# Patient Record
Sex: Female | Born: 1935
Health system: Southern US, Community
[De-identification: ages and names within clinical notes are randomized; demographics above are authoritative.]

## PROBLEM LIST (undated history)

## (undated) DIAGNOSIS — Z87440 Personal history of urinary (tract) infections: Secondary | ICD-10-CM

## (undated) DIAGNOSIS — I10 Essential (primary) hypertension: Secondary | ICD-10-CM

## (undated) DIAGNOSIS — IMO0002 Reserved for concepts with insufficient information to code with codable children: Secondary | ICD-10-CM

## (undated) DIAGNOSIS — H8110 Benign paroxysmal vertigo, unspecified ear: Secondary | ICD-10-CM

## (undated) DIAGNOSIS — M8580 Other specified disorders of bone density and structure, unspecified site: Secondary | ICD-10-CM

## (undated) DIAGNOSIS — E039 Hypothyroidism, unspecified: Secondary | ICD-10-CM

## (undated) DIAGNOSIS — D649 Anemia, unspecified: Secondary | ICD-10-CM

## (undated) DIAGNOSIS — K219 Gastro-esophageal reflux disease without esophagitis: Secondary | ICD-10-CM

## (undated) DIAGNOSIS — Z8719 Personal history of other diseases of the digestive system: Secondary | ICD-10-CM

## (undated) DIAGNOSIS — E785 Hyperlipidemia, unspecified: Secondary | ICD-10-CM

## (undated) DIAGNOSIS — L309 Dermatitis, unspecified: Secondary | ICD-10-CM

## (undated) DIAGNOSIS — F419 Anxiety disorder, unspecified: Secondary | ICD-10-CM

## (undated) DIAGNOSIS — I4589 Other specified conduction disorders: Secondary | ICD-10-CM

## (undated) DIAGNOSIS — A419 Sepsis, unspecified organism: Secondary | ICD-10-CM

## (undated) HISTORY — PX: BREAST SURGERY: SHX581

## (undated) HISTORY — PX: CATARACT EXTRACTION: SUR2

## (undated) HISTORY — DX: Other specified conduction disorders: I45.89

## (undated) HISTORY — DX: Personal history of urinary (tract) infections: Z87.440

## (undated) HISTORY — PX: EYE SURGERY: SHX253

## (undated) HISTORY — PX: TONSILLECTOMY: SUR1361

## (undated) HISTORY — PX: DILATION AND CURETTAGE OF UTERUS: SHX78

## (undated) HISTORY — PX: OTHER SURGICAL HISTORY: SHX169

---

## 1998-01-08 ENCOUNTER — Other Ambulatory Visit: Admission: RE | Admit: 1998-01-08 | Discharge: 1998-01-08 | Payer: Self-pay | Admitting: Radiology

## 1998-03-05 ENCOUNTER — Other Ambulatory Visit: Admission: RE | Admit: 1998-03-05 | Discharge: 1998-03-05 | Payer: Self-pay | Admitting: Endocrinology

## 1998-04-08 ENCOUNTER — Ambulatory Visit (HOSPITAL_COMMUNITY): Admission: RE | Admit: 1998-04-08 | Discharge: 1998-04-08 | Payer: Self-pay | Admitting: Internal Medicine

## 1999-06-05 ENCOUNTER — Other Ambulatory Visit: Admission: RE | Admit: 1999-06-05 | Discharge: 1999-06-05 | Payer: Self-pay | Admitting: Internal Medicine

## 2000-06-06 ENCOUNTER — Other Ambulatory Visit: Admission: RE | Admit: 2000-06-06 | Discharge: 2000-06-06 | Payer: Self-pay | Admitting: Internal Medicine

## 2000-07-26 DIAGNOSIS — A419 Sepsis, unspecified organism: Secondary | ICD-10-CM

## 2000-07-26 HISTORY — DX: Sepsis, unspecified organism: A41.9

## 2009-07-26 HISTORY — PX: KNEE ARTHROSCOPY: SUR90

## 2010-05-20 ENCOUNTER — Ambulatory Visit (HOSPITAL_BASED_OUTPATIENT_CLINIC_OR_DEPARTMENT_OTHER): Admission: RE | Admit: 2010-05-20 | Discharge: 2010-05-20 | Payer: Self-pay | Admitting: Orthopedic Surgery

## 2010-05-26 ENCOUNTER — Emergency Department (HOSPITAL_BASED_OUTPATIENT_CLINIC_OR_DEPARTMENT_OTHER): Admission: EM | Admit: 2010-05-26 | Discharge: 2010-05-26 | Payer: Self-pay | Admitting: Emergency Medicine

## 2010-05-26 ENCOUNTER — Ambulatory Visit: Payer: Self-pay | Admitting: Diagnostic Radiology

## 2010-10-06 LAB — COMPREHENSIVE METABOLIC PANEL
AST: 44 U/L — ABNORMAL HIGH (ref 0–37)
Albumin: 4 g/dL (ref 3.5–5.2)
Alkaline Phosphatase: 83 U/L (ref 39–117)
BUN: 10 mg/dL (ref 6–23)
Chloride: 96 mEq/L (ref 96–112)
GFR calc Af Amer: >60 mL/min (ref >60)
Potassium: 3.6 mEq/L (ref 3.5–5.1)
Total Bilirubin: 0.5 mg/dL (ref 0.3–1.2)

## 2010-10-06 LAB — CBC
Hemoglobin: 11 g/dL — ABNORMAL LOW (ref 12.0–15.0)
MCV: 102.2 fL — ABNORMAL HIGH (ref 78.0–100.0)
Platelets: 217 10*3/uL (ref 150–400)
RBC: 3.02 MIL/uL — ABNORMAL LOW (ref 3.87–5.11)
WBC: 4.4 10*3/uL (ref 4.0–10.5)

## 2010-10-06 LAB — CULTURE, BLOOD (ROUTINE X 2)
Culture  Setup Time: 201111012322
Culture  Setup Time: 201111012322

## 2010-10-06 LAB — DIFFERENTIAL
Basophils Absolute: 0 10*3/uL (ref 0.0–0.1)
Basophils Relative: 0 % (ref 0–1)
Eosinophils Relative: 1 % (ref 0–5)
Monocytes Absolute: 0.5 10*3/uL (ref 0.1–1.0)

## 2010-10-06 LAB — URINALYSIS, ROUTINE W REFLEX MICROSCOPIC
Bilirubin Urine: NEGATIVE
Ketones, ur: NEGATIVE mg/dL
Protein, ur: NEGATIVE mg/dL
Urobilinogen, UA: 0.2 mg/dL (ref 0.0–1.0)

## 2010-10-06 LAB — URINE CULTURE: Culture  Setup Time: 201111012205

## 2010-10-07 LAB — POCT I-STAT, CHEM 8
Creatinine, Ser: 0.6 mg/dL (ref 0.4–1.2)
Glucose, Bld: 107 mg/dL — ABNORMAL HIGH (ref 70–99)
Hemoglobin: 13.3 g/dL (ref 12.0–15.0)
TCO2: 30 mmol/L (ref 0–100)

## 2012-03-20 ENCOUNTER — Other Ambulatory Visit: Payer: Self-pay | Admitting: Orthopedic Surgery

## 2012-03-20 MED ORDER — DEXAMETHASONE SODIUM PHOSPHATE 10 MG/ML IJ SOLN: 10.0000 mg | Freq: Once | INTRAMUSCULAR | Status: DC

## 2012-03-20 MED ORDER — BUPIVACAINE 0.25 % ON-Q PUMP SINGLE CATH 300ML: 300.0000 mL | INJECTION | Status: DC

## 2012-03-20 NOTE — Progress Notes (Signed)
Preoperative surgical orders have been place into the [facility] hospital system for Zeenat Jeanbaptiste Finau on 03/20/2012, 8:24 AM  by Patrica Duel for surgery on 04/24/12.  Preop Total Knee orders including Bupivacaine On-Q pump, IV Tylenol, and IV Decadron as long as there are no contraindications to the above medications. Avel Peace, PA-C

## 2012-04-05 NOTE — H&P (Signed)
[name] [name] DOB: [birth year]  Chief Complaint: right knee pain  History of Present Illness The patient is a 78 year old female who comes in today for a preoperative History and Physical. The patient is scheduled for a right total knee arthroplasty to be performed by Dr. Gus Rankin. Aluisio, MD at [facility] on Monday April 24, 2012 . The patient is being followed for their right knee pain and osteoarthritis. Symptoms reported today include pain, swelling and instability. The patient feels that they are doing poorly. Unfortunately, her pain is getting worse. She is having more limitations in function. She hurts all the time including at night. She cannot do things that she desires. She has now had arthroscopy, cortisone and viscosupplements without benefit. She has significant arthritis in that knee. She is near bone on bone medial and lateral with tibial subluxation. At this point the most predictable means of getting her better would be a knee replacement.    Problem List/Past MedicalHistory Osteoarthritis, Knee (715.96) Hypertension Anxiety/Depression Vertigo Cataract Extraction-Left Hiatal Hernia Gastroesophageal Reflux Disease Urinary Tract Infection Anemia Hypothyroidism Measles Eczema Osteopenia   Allergies No Known Drug Allergies.    Family History Father. deceased age 61 due to lung cancer Mother. deceased age 28 due to stroke, heart disease Siblings. deceased age 60 and 22 due to CAD   Social History Alcohol use. Drinks wine. occasionally Tobacco use. Former smoker. Living situation. Lives alone. Post-Surgical Plans. SNF- [facility] Advance Directives. living will, healthcare POA   Medication History Levothyroxine Sodium ( Tablet, Oral daily) Active. Benicar HCT (20-12.5MG  Tablet, Oral daily) Active. Aspirin Childrens (81MG  Tablet Chewable, Oral evry other day) Active. CloNIDine HCl (0.1MG  Tablet, Oral daily)  Active. Omeprazole (20MG  Tablet DR, Oral daily) Active. Evista (60MG  Tablet, Oral at bedtime) Active. ALPRAZolam (0.25MG  Tablet, Oral as needed) Active.   Past Surgical History Arthroscopic Knee Surgery - Right. 2011    Review of Systems General:Not Present- Chills, Fever, Night Sweats, Fatigue, Weight Gain, Weight Loss and Memory Loss. Skin:Not Present- Hives, Itching, Rash, Eczema and Lesions. HEENT:Not Present- Tinnitus, Headache, Double Vision, Visual Loss, Hearing Loss and Dentures. Respiratory:Not Present- Shortness of breath with exertion, Shortness of breath at rest, Allergies, Coughing up blood and Chronic Cough. Cardiovascular:Not Present- Chest Pain, Racing/skipping heartbeats, Difficulty Breathing Lying Down, Murmur, Swelling and Palpitations. Gastrointestinal:Not Present- Bloody Stool, Heartburn, Abdominal Pain, Vomiting, Nausea, Constipation, Diarrhea, Difficulty Swallowing, Jaundice and Loss of appetitie. Female Genitourinary:Present- Urinating at Night. Not Present- Blood in Urine, Urinary frequency, Weak urinary stream, Discharge, Flank Pain, Incontinence, Painful Urination, Urgency and Urinary Retention. Musculoskeletal:Present- Muscle Weakness, Joint Swelling, Joint Pain and Back Pain. Not Present- Muscle Pain, Morning Stiffness and Spasms. Neurological:Not Present- Tremor, Dizziness, Blackout spells, Paralysis, Difficulty with balance and Weakness. Psychiatric:Present- Insomnia.   Vitals Weight: 158 lb Height: 65.5 in Body Surface Area: 1.82 m Body Mass Index: 25.89 kg/m Pulse: 64 (Regular) Resp.: 16 (Unlabored) BP: 128/82 (Sitting, Left Arm, Standard)    Physical Exam General Mental Status - Alert, cooperative and good historian. General Appearance- pleasant. Not in acute distress. Orientation- Oriented X3. Build & Nutrition- Well nourished and Well developed. Head and Neck Head- normocephalic, atraumatic . Neck Global  Assessment- supple. no bruit auscultated on the right and no bruit auscultated on the left. Eye Pupil- Bilateral- Regular and Round. Motion- Bilateral- EOMI. Chest and Lung Exam Auscultation: Breath sounds:- clear at anterior chest wall and - clear at posterior chest wall. Adventitious sounds:- No Adventitious sounds. Cardiovascular Auscultation:Rhythm- Regular rate and rhythm.  Heart Sounds- S1 WNL and S2 WNL. Murmurs & Other Heart Sounds:Auscultation of the heart reveals - No Murmurs. Abdomen Palpation/Percussion:Tenderness- Abdomen is non-tender to palpation. Rigidity (guarding)- Abdomen is soft. Auscultation:Auscultation of the abdomen reveals - Bowel sounds normal. Female Genitourinary Not done, not pertinent to present illness Peripheral Vascular Upper Extremity: Palpation:- Pulses bilaterally normal. Lower Extremity: Palpation:- Pulses bilaterally normal. Neurologic Examination of related systems reveals - normal muscle strength and tone in all extremities. Neurologic evaluation reveals - normal sensation. Musculoskeletal Right knee shows trace effusion. Range is 5-120. There is marked crepitus on range of motion. She is tender lateral greater than medial. There is no instability.   Assessment & Plan Osteoarthritis, Knee (715.96) Right total knee arthroplasty     Dimitri Ped, PA-C

## 2012-04-12 ENCOUNTER — Encounter (HOSPITAL_COMMUNITY): Payer: Self-pay | Admitting: Pharmacy Technician

## 2012-04-18 ENCOUNTER — Encounter (HOSPITAL_COMMUNITY)
Admission: RE | Admit: 2012-04-18 | Discharge: 2012-04-18 | Disposition: A | Discharge status: Home / Self Care | Payer: Medicare Other | Source: Ambulatory Visit | Attending: Orthopedic Surgery | Admitting: Orthopedic Surgery

## 2012-04-18 ENCOUNTER — Encounter (HOSPITAL_COMMUNITY): Payer: Self-pay

## 2012-04-18 HISTORY — DX: Anemia, unspecified: D64.9

## 2012-04-18 HISTORY — DX: Dermatitis, unspecified: L30.9

## 2012-04-18 HISTORY — DX: Gastro-esophageal reflux disease without esophagitis: K21.9

## 2012-04-18 HISTORY — DX: Other specified disorders of bone density and structure, unspecified site: M85.80

## 2012-04-18 HISTORY — DX: Reserved for concepts with insufficient information to code with codable children: IMO0002

## 2012-04-18 HISTORY — DX: Hyperlipidemia, unspecified: E78.5

## 2012-04-18 HISTORY — DX: Hypothyroidism, unspecified: E03.9

## 2012-04-18 HISTORY — DX: Personal history of other diseases of the digestive system: Z87.19

## 2012-04-18 HISTORY — DX: Benign paroxysmal vertigo, unspecified ear: H81.10

## 2012-04-18 HISTORY — DX: Sepsis, unspecified organism: A41.9

## 2012-04-18 HISTORY — DX: Anxiety disorder, unspecified: F41.9

## 2012-04-18 HISTORY — DX: Essential (primary) hypertension: I10

## 2012-04-18 LAB — URINALYSIS, ROUTINE W REFLEX MICROSCOPIC
Bilirubin Urine: NEGATIVE
Glucose, UA: NEGATIVE mg/dL
Hgb urine dipstick: NEGATIVE
Ketones, ur: NEGATIVE mg/dL
Protein, ur: NEGATIVE mg/dL
pH: 6 (ref 5.0–8.0)

## 2012-04-18 LAB — COMPREHENSIVE METABOLIC PANEL
ALT: 16 U/L (ref 0–35)
AST: 36 U/L (ref 0–37)
CO2: 25 mEq/L (ref 19–32)
Chloride: 83 mEq/L — ABNORMAL LOW (ref 96–112)
GFR calc Af Amer: >90 mL/min (ref >90)
GFR calc non Af Amer: 90 mL/min — ABNORMAL LOW (ref >90)
Glucose, Bld: 117 mg/dL — ABNORMAL HIGH (ref 70–99)
Sodium: 120 mEq/L — ABNORMAL LOW (ref 135–145)
Total Bilirubin: 0.4 mg/dL (ref 0.3–1.2)

## 2012-04-18 LAB — CBC
Hemoglobin: 11.6 g/dL — ABNORMAL LOW (ref 12.0–15.0)
MCH: 34.7 pg — ABNORMAL HIGH (ref 26.0–34.0)
MCV: 99.1 fL (ref 78.0–100.0)
Platelets: 234 10*3/uL (ref 150–400)
RBC: 3.34 MIL/uL — ABNORMAL LOW (ref 3.87–5.11)
WBC: 7 10*3/uL (ref 4.0–10.5)

## 2012-04-18 LAB — URINE MICROSCOPIC-ADD ON

## 2012-04-18 NOTE — Pre-Procedure Instructions (Signed)
Clearance with LOV Dr Jacky Kindle on chart, EKG, CHEST 5/13 CHART

## 2012-04-18 NOTE — Patient Instructions (Addendum)
[address] Charleene Tillett Haberl  04/18/2012   Your procedure is scheduled on:  04/24/12  Monday   Surgery [id number]  Report to [facility] at   1005      Call this number if you have problems the morning of surgery: [phone]     Or PST   [phone]  [facility]   Remember:   Do not eat food  Or drink any fluids :After Midnight. Sunday NIGHT       Take these medicines the morning of surgery with A SIP OF WATER:  SYNTHYROID,  PROLISEC  Do not wear jewelry, make-up or nail polish.  Do not wear lotions, powders, or perfumes. You may wear deodorant.  Do not shave 48 hours prior to surgery.  Do not bring valuables to the hospital.  Contacts, dentures or bridgework may not be worn into surgery.  Leave suitcase in the car. After surgery it may be brought to your room.  For patients admitted to the hospital, checkout time is 11:00 AM the day of discharge.   Patients discharged the day of surgery will not be allowed to drive home.  Name and phone number of your driver: [employer]                                                                     Special Instructions: CHG Shower Use Special Wash:   SEE SEPARATE INSTRUCTIONS REGARDING SHOWERS REGULAR SOAP FACE AND PRIVATES              LADIES- NO SHAVING 48 HOURS BEFORE USING BETASEPT SOAP.                  Please read over the following fact sheets that you were given: MRSA Information

## 2012-04-18 NOTE — Progress Notes (Signed)
Dr Lequita Halt- please review abnormal CMET and Urinalysis with micro     Surgery 04/24/12    Thanks

## 2012-04-23 ENCOUNTER — Other Ambulatory Visit: Payer: Self-pay | Admitting: Orthopedic Surgery

## 2012-04-24 ENCOUNTER — Encounter (HOSPITAL_COMMUNITY)
Admission: RE | Disposition: A | Discharge status: Skilled Nursing Facility | Payer: Self-pay | Source: Ambulatory Visit | Attending: Orthopedic Surgery

## 2012-04-24 ENCOUNTER — Inpatient Hospital Stay (HOSPITAL_COMMUNITY)
Admission: RE | Admit: 2012-04-24 | Discharge: 2012-04-27 | DRG: 470 | Disposition: A | Discharge status: Skilled Nursing Facility | Payer: Medicare Other | Source: Ambulatory Visit | Attending: Orthopedic Surgery | Admitting: Orthopedic Surgery

## 2012-04-24 ENCOUNTER — Encounter (HOSPITAL_COMMUNITY): Payer: Self-pay | Admitting: *Deleted

## 2012-04-24 ENCOUNTER — Inpatient Hospital Stay (HOSPITAL_COMMUNITY): Payer: Medicare Other | Admitting: Anesthesiology

## 2012-04-24 ENCOUNTER — Encounter (HOSPITAL_COMMUNITY): Payer: Self-pay | Admitting: Anesthesiology

## 2012-04-24 DIAGNOSIS — E876 Hypokalemia: Secondary | ICD-10-CM

## 2012-04-24 DIAGNOSIS — E871 Hypo-osmolality and hyponatremia: Secondary | ICD-10-CM | POA: Diagnosis not present

## 2012-04-24 DIAGNOSIS — D62 Acute posthemorrhagic anemia: Secondary | ICD-10-CM

## 2012-04-24 DIAGNOSIS — Z96659 Presence of unspecified artificial knee joint: Secondary | ICD-10-CM

## 2012-04-24 DIAGNOSIS — M899 Disorder of bone, unspecified: Secondary | ICD-10-CM | POA: Diagnosis present

## 2012-04-24 DIAGNOSIS — E785 Hyperlipidemia, unspecified: Secondary | ICD-10-CM | POA: Diagnosis present

## 2012-04-24 DIAGNOSIS — L259 Unspecified contact dermatitis, unspecified cause: Secondary | ICD-10-CM | POA: Diagnosis present

## 2012-04-24 DIAGNOSIS — Z87891 Personal history of nicotine dependence: Secondary | ICD-10-CM

## 2012-04-24 DIAGNOSIS — Z9289 Personal history of other medical treatment: Secondary | ICD-10-CM

## 2012-04-24 DIAGNOSIS — M1710 Unilateral primary osteoarthritis, unspecified knee: Principal | ICD-10-CM | POA: Diagnosis present

## 2012-04-24 DIAGNOSIS — Z8249 Family history of ischemic heart disease and other diseases of the circulatory system: Secondary | ICD-10-CM

## 2012-04-24 DIAGNOSIS — F411 Generalized anxiety disorder: Secondary | ICD-10-CM | POA: Diagnosis present

## 2012-04-24 DIAGNOSIS — F3289 Other specified depressive episodes: Secondary | ICD-10-CM | POA: Diagnosis present

## 2012-04-24 DIAGNOSIS — I10 Essential (primary) hypertension: Secondary | ICD-10-CM | POA: Diagnosis present

## 2012-04-24 DIAGNOSIS — E039 Hypothyroidism, unspecified: Secondary | ICD-10-CM | POA: Diagnosis present

## 2012-04-24 DIAGNOSIS — M179 Osteoarthritis of knee, unspecified: Secondary | ICD-10-CM

## 2012-04-24 DIAGNOSIS — K219 Gastro-esophageal reflux disease without esophagitis: Secondary | ICD-10-CM | POA: Diagnosis present

## 2012-04-24 DIAGNOSIS — M949 Disorder of cartilage, unspecified: Secondary | ICD-10-CM | POA: Diagnosis present

## 2012-04-24 DIAGNOSIS — F329 Major depressive disorder, single episode, unspecified: Secondary | ICD-10-CM | POA: Diagnosis present

## 2012-04-24 DIAGNOSIS — Z79899 Other long term (current) drug therapy: Secondary | ICD-10-CM

## 2012-04-24 DIAGNOSIS — Z7982 Long term (current) use of aspirin: Secondary | ICD-10-CM

## 2012-04-24 DIAGNOSIS — IMO0002 Reserved for concepts with insufficient information to code with codable children: Secondary | ICD-10-CM | POA: Diagnosis present

## 2012-04-24 DIAGNOSIS — K449 Diaphragmatic hernia without obstruction or gangrene: Secondary | ICD-10-CM | POA: Diagnosis present

## 2012-04-24 HISTORY — PX: TOTAL KNEE ARTHROPLASTY: SHX125

## 2012-04-24 LAB — POCT I-STAT 4, (NA,K, GLUC, HGB,HCT)
Glucose, Bld: 116 mg/dL — ABNORMAL HIGH (ref 70–99)
HCT: 39 % (ref 36.0–46.0)
Hemoglobin: 13.3 g/dL (ref 12.0–15.0)

## 2012-04-24 LAB — ABO/RH: ABO/RH(D): O POS

## 2012-04-24 SURGERY — ARTHROPLASTY, KNEE, TOTAL
Anesthesia: Spinal | Site: Knee | Laterality: Right | Wound class: Clean

## 2012-04-24 MED ORDER — CEFAZOLIN SODIUM 1-5 GM-% IV SOLN
1.0000 g | Freq: Four times a day (QID) | INTRAVENOUS | Status: AC
  Administered 2012-04-24 – 2012-04-25 (×2): 1 g via INTRAVENOUS
  Filled 2012-04-24 (×2): qty 50

## 2012-04-24 MED ORDER — ACETAMINOPHEN 325 MG PO TABS: 650.0000 mg | ORAL_TABLET | Freq: Four times a day (QID) | ORAL | Status: DC | PRN

## 2012-04-24 MED ORDER — DIPHENHYDRAMINE HCL 12.5 MG/5ML PO ELIX: 12.5000 mg | ORAL_SOLUTION | ORAL | Status: DC | PRN

## 2012-04-24 MED ORDER — PANTOPRAZOLE SODIUM 40 MG PO TBEC
40.0000 mg | DELAYED_RELEASE_TABLET | Freq: Every day | ORAL | Status: DC
  Administered 2012-04-25 – 2012-04-26 (×2): 40 mg via ORAL
  Filled 2012-04-24 (×2): qty 1

## 2012-04-24 MED ORDER — BISACODYL 10 MG RE SUPP
10.0000 mg | Freq: Every day | RECTAL | Status: DC | PRN
  Administered 2012-04-26: 10 mg via RECTAL
  Filled 2012-04-24: qty 1

## 2012-04-24 MED ORDER — METHOCARBAMOL 500 MG PO TABS
500.0000 mg | ORAL_TABLET | Freq: Four times a day (QID) | ORAL | Status: DC | PRN
  Administered 2012-04-25 – 2012-04-27 (×4): 500 mg via ORAL
  Filled 2012-04-24 (×5): qty 1

## 2012-04-24 MED ORDER — MENTHOL 3 MG MT LOZG: 1.0000 | LOZENGE | OROMUCOSAL | Status: DC | PRN

## 2012-04-24 MED ORDER — BUPIVACAINE 0.25 % ON-Q PUMP SINGLE CATH 300ML
INJECTION | Status: DC | PRN
  Administered 2012-04-24: 300 mL

## 2012-04-24 MED ORDER — PHENOL 1.4 % MT LIQD: 1.0000 | OROMUCOSAL | Status: DC | PRN

## 2012-04-24 MED ORDER — ONDANSETRON HCL 4 MG/2ML IJ SOLN
4.0000 mg | Freq: Four times a day (QID) | INTRAMUSCULAR | Status: DC | PRN
  Administered 2012-04-26: 4 mg via INTRAVENOUS
  Filled 2012-04-24: qty 2

## 2012-04-24 MED ORDER — ONDANSETRON HCL 4 MG PO TABS
4.0000 mg | ORAL_TABLET | Freq: Four times a day (QID) | ORAL | Status: DC | PRN
  Administered 2012-04-27: 4 mg via ORAL
  Filled 2012-04-24: qty 1

## 2012-04-24 MED ORDER — DEXTROSE 5 % IV SOLN
3.0000 g | INTRAVENOUS | Status: AC
  Administered 2012-04-24: 2 g via INTRAVENOUS
  Filled 2012-04-24: qty 3000

## 2012-04-24 MED ORDER — MORPHINE SULFATE 10 MG/ML IJ SOLN: 1.0000 mg | INTRAMUSCULAR | Status: DC | PRN

## 2012-04-24 MED ORDER — PROPOFOL INFUSION 10 MG/ML OPTIME
INTRAVENOUS | Status: DC | PRN
  Administered 2012-04-24: 75 ug/kg/min via INTRAVENOUS

## 2012-04-24 MED ORDER — PROMETHAZINE HCL 25 MG/ML IJ SOLN: 6.2500 mg | INTRAMUSCULAR | Status: DC | PRN

## 2012-04-24 MED ORDER — FENTANYL CITRATE 0.05 MG/ML IJ SOLN
INTRAMUSCULAR | Status: DC | PRN
  Administered 2012-04-24 (×2): 50 ug via INTRAVENOUS

## 2012-04-24 MED ORDER — POTASSIUM CHLORIDE IN NACL 20-0.9 MEQ/L-% IV SOLN
INTRAVENOUS | Status: DC
  Administered 2012-04-24 – 2012-04-25 (×2): via INTRAVENOUS
  Filled 2012-04-24 (×3): qty 1000

## 2012-04-24 MED ORDER — ALPRAZOLAM 0.25 MG PO TABS
0.2500 mg | ORAL_TABLET | Freq: Every evening | ORAL | Status: DC | PRN
  Administered 2012-04-25 – 2012-04-26 (×2): 0.25 mg via ORAL
  Filled 2012-04-24 (×3): qty 1

## 2012-04-24 MED ORDER — MORPHINE SULFATE 2 MG/ML IJ SOLN
1.0000 mg | INTRAMUSCULAR | Status: DC | PRN
  Administered 2012-04-24: 2 mg via INTRAVENOUS
  Administered 2012-04-24 (×2): 1 mg via INTRAVENOUS
  Administered 2012-04-24 – 2012-04-25 (×2): 2 mg via INTRAVENOUS
  Filled 2012-04-24 (×5): qty 1

## 2012-04-24 MED ORDER — LEVOTHYROXINE SODIUM 25 MCG PO TABS
25.0000 ug | ORAL_TABLET | Freq: Every day | ORAL | Status: DC
  Administered 2012-04-25 – 2012-04-27 (×3): 25 ug via ORAL
  Filled 2012-04-24 (×4): qty 1

## 2012-04-24 MED ORDER — BUPIVACAINE IN DEXTROSE 0.75-8.25 % IT SOLN
INTRATHECAL | Status: DC | PRN
  Administered 2012-04-24: 1.8 mL via INTRATHECAL

## 2012-04-24 MED ORDER — ONDANSETRON HCL 4 MG/2ML IJ SOLN
INTRAMUSCULAR | Status: DC | PRN
  Administered 2012-04-24: 4 mg via INTRAVENOUS

## 2012-04-24 MED ORDER — LACTATED RINGERS IV SOLN: INTRAVENOUS | Status: DC

## 2012-04-24 MED ORDER — METOCLOPRAMIDE HCL 5 MG/ML IJ SOLN: 5.0000 mg | Freq: Three times a day (TID) | INTRAMUSCULAR | Status: DC | PRN

## 2012-04-24 MED ORDER — DOCUSATE SODIUM 100 MG PO CAPS
100.0000 mg | ORAL_CAPSULE | Freq: Two times a day (BID) | ORAL | Status: DC
  Administered 2012-04-24 – 2012-04-27 (×6): 100 mg via ORAL

## 2012-04-24 MED ORDER — 0.9 % SODIUM CHLORIDE (POUR BTL) OPTIME
TOPICAL | Status: DC | PRN
  Administered 2012-04-24: 1000 mL

## 2012-04-24 MED ORDER — BUPIVACAINE ON-Q PAIN PUMP (FOR ORDER SET NO CHG)
INJECTION | Status: DC
  Filled 2012-04-24: qty 1

## 2012-04-24 MED ORDER — ACETAMINOPHEN 10 MG/ML IV SOLN: 1000.0000 mg | Freq: Once | INTRAVENOUS | Status: DC

## 2012-04-24 MED ORDER — SODIUM CHLORIDE 0.9 % IV SOLN
INTRAVENOUS | Status: DC
  Administered 2012-04-24: 12:00:00 via INTRAVENOUS
  Administered 2012-04-24: 1000 mL via INTRAVENOUS

## 2012-04-24 MED ORDER — EPHEDRINE SULFATE 50 MG/ML IJ SOLN
INTRAMUSCULAR | Status: DC | PRN
  Administered 2012-04-24 (×2): 10 mg via INTRAVENOUS
  Administered 2012-04-24 (×2): 5 mg via INTRAVENOUS

## 2012-04-24 MED ORDER — TRAMADOL HCL 50 MG PO TABS
50.0000 mg | ORAL_TABLET | Freq: Four times a day (QID) | ORAL | Status: DC | PRN
  Administered 2012-04-26 – 2012-04-27 (×3): 50 mg via ORAL
  Filled 2012-04-24: qty 1
  Filled 2012-04-24: qty 2
  Filled 2012-04-24 (×2): qty 1

## 2012-04-24 MED ORDER — CLONIDINE HCL 0.1 MG PO TABS
0.1000 mg | ORAL_TABLET | Freq: Every day | ORAL | Status: DC
  Administered 2012-04-24 – 2012-04-26 (×3): 0.1 mg via ORAL
  Filled 2012-04-24 (×4): qty 1

## 2012-04-24 MED ORDER — SODIUM CHLORIDE 0.9 % IR SOLN
Status: DC | PRN
  Administered 2012-04-24: 1000 mL

## 2012-04-24 MED ORDER — OLMESARTAN MEDOXOMIL-HCTZ 20-12.5 MG PO TABS: 1.0000 | ORAL_TABLET | Freq: Every day | ORAL | Status: DC

## 2012-04-24 MED ORDER — FLEET ENEMA 7-19 GM/118ML RE ENEM: 1.0000 | ENEMA | Freq: Once | RECTAL | Status: AC | PRN

## 2012-04-24 MED ORDER — HYDROCHLOROTHIAZIDE 12.5 MG PO CAPS
12.5000 mg | ORAL_CAPSULE | Freq: Every day | ORAL | Status: DC
  Administered 2012-04-25 – 2012-04-27 (×3): 12.5 mg via ORAL
  Filled 2012-04-24 (×3): qty 1

## 2012-04-24 MED ORDER — DEXTROSE 5 % IV SOLN
500.0000 mg | Freq: Four times a day (QID) | INTRAVENOUS | Status: DC | PRN
  Administered 2012-04-24: 500 mg via INTRAVENOUS
  Filled 2012-04-24: qty 5

## 2012-04-24 MED ORDER — IRBESARTAN 150 MG PO TABS
150.0000 mg | ORAL_TABLET | Freq: Every day | ORAL | Status: DC
  Administered 2012-04-25 – 2012-04-27 (×3): 150 mg via ORAL
  Filled 2012-04-24 (×3): qty 1

## 2012-04-24 MED ORDER — ACETAMINOPHEN 650 MG RE SUPP: 650.0000 mg | Freq: Four times a day (QID) | RECTAL | Status: DC | PRN

## 2012-04-24 MED ORDER — METOCLOPRAMIDE HCL 10 MG PO TABS
5.0000 mg | ORAL_TABLET | Freq: Three times a day (TID) | ORAL | Status: DC | PRN
  Administered 2012-04-26: 10 mg via ORAL
  Filled 2012-04-24: qty 1

## 2012-04-24 MED ORDER — ACETAMINOPHEN 10 MG/ML IV SOLN
1000.0000 mg | Freq: Four times a day (QID) | INTRAVENOUS | Status: AC
  Administered 2012-04-24 – 2012-04-25 (×4): 1000 mg via INTRAVENOUS
  Filled 2012-04-24 (×4): qty 100

## 2012-04-24 MED ORDER — ACETAMINOPHEN 10 MG/ML IV SOLN
INTRAVENOUS | Status: DC | PRN
  Administered 2012-04-24: 1000 mg via INTRAVENOUS

## 2012-04-24 MED ORDER — OXYCODONE HCL 5 MG PO TABS
5.0000 mg | ORAL_TABLET | ORAL | Status: DC | PRN
  Administered 2012-04-24 – 2012-04-26 (×8): 10 mg via ORAL
  Administered 2012-04-26: 5 mg via ORAL
  Administered 2012-04-26: 10 mg via ORAL
  Administered 2012-04-26: 5 mg via ORAL
  Administered 2012-04-26: 10 mg via ORAL
  Filled 2012-04-24 (×5): qty 2
  Filled 2012-04-24: qty 1
  Filled 2012-04-24 (×3): qty 2
  Filled 2012-04-24: qty 1
  Filled 2012-04-24 (×3): qty 2

## 2012-04-24 MED ORDER — POLYETHYLENE GLYCOL 3350 17 G PO PACK: 17.0000 g | PACK | Freq: Every day | ORAL | Status: DC | PRN

## 2012-04-24 MED ORDER — RIVAROXABAN 10 MG PO TABS
10.0000 mg | ORAL_TABLET | Freq: Every day | ORAL | Status: DC
  Administered 2012-04-25 – 2012-04-27 (×3): 10 mg via ORAL
  Filled 2012-04-24 (×4): qty 1

## 2012-04-24 SURGICAL SUPPLY — 54 items
BAG SPEC THK2 15X12 ZIP CLS (MISCELLANEOUS) ×1
BAG ZIPLOCK 12X15 (MISCELLANEOUS) ×2 IMPLANT
BANDAGE ELASTIC 6 VELCRO ST LF (GAUZE/BANDAGES/DRESSINGS) ×2 IMPLANT
BANDAGE ESMARK 6X9 LF (GAUZE/BANDAGES/DRESSINGS) ×1 IMPLANT
BLADE SAG 18X100X1.27 (BLADE) ×2 IMPLANT
BLADE SAW SGTL 11.0X1.19X90.0M (BLADE) ×2 IMPLANT
BNDG CMPR 9X6 STRL LF SNTH (GAUZE/BANDAGES/DRESSINGS) ×1
BNDG ESMARK 6X9 LF (GAUZE/BANDAGES/DRESSINGS) ×2
BOWL SMART MIX CTS (DISPOSABLE) ×2 IMPLANT
CATH KIT ON-Q SILVERSOAK 5 (CATHETERS) ×1 IMPLANT
CATH KIT ON-Q SILVERSOAK 5IN (CATHETERS) ×2 IMPLANT
CEMENT HV SMART SET (Cement) ×4 IMPLANT
CLOTH BEACON ORANGE TIMEOUT ST (SAFETY) ×2 IMPLANT
CLSR STERI-STRIP ANTIMIC 1/2X4 (GAUZE/BANDAGES/DRESSINGS) ×1 IMPLANT
CUFF TOURN SGL QUICK 34 (TOURNIQUET CUFF) ×2
CUFF TRNQT CYL 34X4X40X1 (TOURNIQUET CUFF) ×1 IMPLANT
DRAPE EXTREMITY T 121X128X90 (DRAPE) ×2 IMPLANT
DRAPE POUCH INSTRU U-SHP 10X18 (DRAPES) ×2 IMPLANT
DRAPE U-SHAPE 47X51 STRL (DRAPES) ×2 IMPLANT
DRSG ADAPTIC 3X8 NADH LF (GAUZE/BANDAGES/DRESSINGS) ×2 IMPLANT
DURAPREP 26ML APPLICATOR (WOUND CARE) ×2 IMPLANT
ELECT REM PT RETURN 9FT ADLT (ELECTROSURGICAL) ×2
ELECTRODE REM PT RTRN 9FT ADLT (ELECTROSURGICAL) ×1 IMPLANT
EVACUATOR 1/8 PVC DRAIN (DRAIN) ×2 IMPLANT
FACESHIELD LNG OPTICON STERILE (SAFETY) ×10 IMPLANT
GLOVE BIO SURGEON STRL SZ8 (GLOVE) ×2 IMPLANT
GLOVE BIOGEL PI IND STRL 8 (GLOVE) ×2 IMPLANT
GLOVE BIOGEL PI INDICATOR 8 (GLOVE) ×2
GLOVE ECLIPSE 8.0 STRL XLNG CF (GLOVE) ×2 IMPLANT
GLOVE SURG SS PI 6.5 STRL IVOR (GLOVE) ×4 IMPLANT
GOWN STRL NON-REIN LRG LVL3 (GOWN DISPOSABLE) ×4 IMPLANT
GOWN STRL REIN XL XLG (GOWN DISPOSABLE) ×2 IMPLANT
HANDPIECE INTERPULSE COAX TIP (DISPOSABLE) ×2
IMMOBILIZER KNEE 20 (SOFTGOODS) ×2
IMMOBILIZER KNEE 20 THIGH 36 (SOFTGOODS) ×1 IMPLANT
KIT BASIN OR (CUSTOM PROCEDURE TRAY) ×2 IMPLANT
MANIFOLD NEPTUNE II (INSTRUMENTS) ×2 IMPLANT
NS IRRIG 1000ML POUR BTL (IV SOLUTION) ×2 IMPLANT
PACK TOTAL JOINT (CUSTOM PROCEDURE TRAY) ×2 IMPLANT
PAD ABD 7.5X8 STRL (GAUZE/BANDAGES/DRESSINGS) ×2 IMPLANT
PADDING CAST COTTON 6X4 STRL (CAST SUPPLIES) ×4 IMPLANT
POSITIONER SURGICAL ARM (MISCELLANEOUS) ×2 IMPLANT
SET HNDPC FAN SPRY TIP SCT (DISPOSABLE) ×1 IMPLANT
SPONGE GAUZE 4X4 12PLY (GAUZE/BANDAGES/DRESSINGS) ×2 IMPLANT
STRIP CLOSURE SKIN 1/2X4 (GAUZE/BANDAGES/DRESSINGS) ×4 IMPLANT
SUCTION FRAZIER 12FR DISP (SUCTIONS) ×2 IMPLANT
SUT MNCRL AB 4-0 PS2 18 (SUTURE) ×2 IMPLANT
SUT VIC AB 2-0 CT1 27 (SUTURE) ×6
SUT VIC AB 2-0 CT1 TAPERPNT 27 (SUTURE) ×3 IMPLANT
SUT VLOC 180 0 24IN GS25 (SUTURE) ×2 IMPLANT
TOWEL OR 17X26 10 PK STRL BLUE (TOWEL DISPOSABLE) ×4 IMPLANT
TRAY FOLEY CATH 14FRSI W/METER (CATHETERS) ×2 IMPLANT
WATER STERILE IRR 1500ML POUR (IV SOLUTION) ×3 IMPLANT
WRAP KNEE MAXI GEL POST OP (GAUZE/BANDAGES/DRESSINGS) ×3 IMPLANT

## 2012-04-24 NOTE — Preoperative (Signed)
Beta Blockers   Reason not to administer Beta Blockers:Not Applicable 

## 2012-04-24 NOTE — Interval H&P Note (Signed)
History and Physical Interval Note:  04/24/2012 10:44 AM  [name] [name]  has presented today for surgery, with the diagnosis of osteoarthritis right knee  The various methods of treatment have been discussed with the patient and family. After consideration of risks, benefits and other options for treatment, the patient has consented to  Procedure(s) (LRB) with comments: TOTAL KNEE ARTHROPLASTY (Right) as a surgical intervention .  The patient's history has been reviewed, patient examined, no change in status, stable for surgery.  I have reviewed the patient's chart and labs.  Questions were answered to the patient's satisfaction.     Loanne Drilling

## 2012-04-24 NOTE — Transfer of Care (Signed)
Immediate Anesthesia Transfer of Care Note  Patient: [name] [name]  Procedure(s) Performed: Procedure(s) (LRB) with comments: TOTAL KNEE ARTHROPLASTY (Right)  Patient Location: PACU  Anesthesia Type: Spinal  Level of Consciousness: awake, alert  and oriented  Airway & Oxygen Therapy: Patient Spontanous Breathing and Patient connected to face mask oxygen  Post-op Assessment: Report given to PACU RN and Post -op Vital signs reviewed and stable  Post vital signs: Reviewed and stable  Complications: No apparent anesthesia complications

## 2012-04-24 NOTE — Anesthesia Preprocedure Evaluation (Addendum)
Anesthesia Evaluation  Patient identified by MRN, date of birth, ID band Patient awake    Reviewed: Allergy & Precautions, H&P , NPO status , Patient's Chart, lab work & pertinent test results  Airway Mallampati: II TM Distance: >3 FB Neck ROM: Full    Dental  (+) Teeth Intact, Caps and Implants,    Pulmonary neg pulmonary ROS,  breath sounds clear to auscultation  Pulmonary exam normal       Cardiovascular hypertension, Pt. on medications Rhythm:Regular Rate:Normal     Neuro/Psych Anxiety negative neurological ROS     GI/Hepatic Neg liver ROS, hiatal hernia, GERD-  Medicated,  Endo/Other  Hypothyroidism   Renal/GU negative Renal ROSHyponatremia  negative genitourinary   Musculoskeletal negative musculoskeletal ROS (+)   Abdominal   Peds negative pediatric ROS (+)  Hematology negative hematology ROS (+)   Anesthesia Other Findings   Reproductive/Obstetrics negative OB ROS                        Anesthesia Physical Anesthesia Plan  ASA: II  Anesthesia Plan: Spinal   Post-op Pain Management:    Induction:   Airway Management Planned: Simple Face Mask  Additional Equipment:   Intra-op Plan:   Post-operative Plan:   Informed Consent: I have reviewed the patients History and Physical, chart, labs and discussed the procedure including the risks, benefits and alternatives for the proposed anesthesia with the patient or authorized representative who has indicated his/her understanding and acceptance.   Dental advisory given  Plan Discussed with: CRNA  Anesthesia Plan Comments:        Anesthesia Quick Evaluation

## 2012-04-24 NOTE — Anesthesia Postprocedure Evaluation (Signed)
Anesthesia Post Note  Patient: [name] [name]  Procedure(s) Performed: Procedure(s) (LRB): TOTAL KNEE ARTHROPLASTY (Right)  Anesthesia type: Spinal  Patient location: PACU  Post pain: Pain level controlled  Post assessment: Post-op Vital signs reviewed  Last Vitals:  Filed Vitals:   04/24/12 1803  BP: 136/71  Pulse: 70  Temp: 36.7 C  Resp: 16    Post vital signs: Reviewed  Level of consciousness: sedated  Complications: No apparent anesthesia complications

## 2012-04-24 NOTE — Anesthesia Procedure Notes (Signed)
Spinal  Patient location during procedure: OR Start time: 04/24/2012 11:35 AM End time: 04/24/2012 11:40 AM Staffing Anesthesiologist: Lucille Passy F Performed by: anesthesiologist  Preanesthetic Checklist Completed: patient identified, site marked, surgical consent, pre-op evaluation, timeout performed, IV checked, risks and benefits discussed and monitors and equipment checked Spinal Block Patient position: sitting Prep: Betadine Patient monitoring: heart rate, continuous pulse ox and blood pressure Approach: midline Location: L2-3 Injection technique: single-shot Needle Needle type: Sprotte and Quincke  Needle gauge: 22 G Needle length: 9 cm Assessment Sensory level: T6 Additional Notes Expiration date of kit checked and confirmed. Patient tolerated procedure well, without complications. Negative heme/paresthesia Lot [id number] 06/2013

## 2012-04-24 NOTE — Progress Notes (Signed)
Utilization review completed.  

## 2012-04-24 NOTE — Op Note (Signed)
Pre-operative diagnosis- Osteoarthritis  Right knee(s)  Post-operative diagnosis- Osteoarthritis Right knee(s)  Procedure-  Right  Total Knee Arthroplasty  Surgeon- [name] Rankin. Aunya Lemler, MD  Assistant- Leilani Able, PA-C   Anesthesia-  Spinal EBL-* No blood loss amount entered *  Drains Hemovac  Tourniquet time-  Total Tourniquet Time Documented: Thigh (Right) - 33 minutes   Complications- None  Condition-PACU - hemodynamically stable.   Brief Clinical Note  [name] [name] is a 78 y.o. year old female with end stage OA of her right knee with progressively worsening pain and dysfunction. She has constant pain, with activity and at rest and significant functional deficits with difficulties even with ADLs. She has had extensive non-op management including analgesics, injections of cortisone and viscosupplements, and home exercise program, but remains in significant pain with significant dysfunction.Radiographs show bone on bone arthritis lateral and patellofemoral. She presents now for right Total Knee Arthroplasty.    Procedure in detail---   The patient is brought into the operating room and positioned supine on the operating table. After successful administration of  Spinal,   a tourniquet is placed high on the  Right thigh(s) and the lower extremity is prepped and draped in the usual sterile fashion. Time out is performed by the operating team and then the  Right lower extremity is wrapped in Esmarch, knee flexed and the tourniquet inflated to 300 mmHg.       A midline incision is made with a ten blade through the subcutaneous tissue to the level of the extensor mechanism. A fresh blade is used to make a medial parapatellar arthrotomy. Soft tissue over the proximal medial tibia is subperiosteally elevated to the joint line with a knife and into the semimembranosus bursa with a Cobb elevator. Soft tissue over the proximal lateral tibia is elevated with attention being paid to avoiding the  patellar tendon on the tibial tubercle. The patella is everted, knee flexed 90 degrees and the ACL and PCL are removed. Findings are bone on bone lateral and patellofemoral with large lateral osteophytes.        The drill is used to create a starting hole in the distal femur and the canal is thoroughly irrigated with sterile saline to remove the fatty contents. The 5 degree Right  valgus alignment guide is placed into the femoral canal and the distal femoral cutting block is pinned to remove 11 mm off the distal femur. Resection is made with an oscillating saw.      The tibia is subluxed forward and the menisci are removed. The extramedullary alignment guide is placed referencing proximally at the medial aspect of the tibial tubercle and distally along the second metatarsal axis and tibial crest. The block is pinned to remove 2mm off the more deficient lateral  side. Resection is made with an oscillating saw. Size 2.5is the most appropriate size for the tibia and the proximal tibia is prepared with the modular drill and keel punch for that size.      The femoral sizing guide is placed and size 3 is most appropriate. Rotation is marked off the epicondylar axis and confirmed by creating a rectangular flexion gap at 90 degrees. The size 3 cutting block is pinned in this rotation and the anterior, posterior and chamfer cuts are made with the oscillating saw. The intercondylar block is then placed and that cut is made.      Trial size 2.5 tibial component, trial size 3 posterior stabilized femur and a 12.5  mm  posterior stabilized rotating platform insert trial is placed. Full extension is achieved with excellent varus/valgus and anterior/posterior balance throughout full range of motion. The patella is everted and thickness measured to be 22  mm. Free hand resection is taken to 12 mm, a 35 template is placed, lug holes are drilled, trial patella is placed, and it tracks normally. Osteophytes are removed off the  posterior femur with the trial in place. All trials are removed and the cut bone surfaces prepared with pulsatile lavage. Cement is mixed and once ready for implantation, the size 2.5 tibial implant, size  3 posterior stabilized femoral component, and the size 35 patella are cemented in place and the patella is held with the clamp. The trial insert is placed and the knee held in full extension. All extruded cement is removed and once the cement is hard the permanent 12.5 mm posterior stabilized rotating platform insert is placed into the tibial tray.      The wound is copiously irrigated with saline solution and the extensor mechanism closed over a hemovac drain with #1 PDS suture. The tourniquet is released for a total tourniquet time of 32  minutes. Flexion against gravity is 140 degrees and the patella tracks normally. Subcutaneous tissue is closed with 2.0 vicryl and subcuticular with running 4.0 Monocryl. The catheter for the Marcaine pain pump is placed and the pump is initiated. The incision is cleaned and dried and steri-strips and a bulky sterile dressing are applied. The limb is placed into a knee immobilizer and the patient is awakened and transported to recovery in stable condition.      Please note that a surgical assistant was a medical necessity for this procedure in order to perform it in a safe and expeditious manner. Surgical assistant was necessary to retract the ligaments and vital neurovascular structures to prevent injury to them and also necessary for proper positioning of the limb to allow for anatomic placement of the prosthesis.   [name] Rankin Husna Krone, MD    04/24/2012, 12:32 PM

## 2012-04-25 LAB — HEMOGLOBIN AND HEMATOCRIT, BLOOD: HCT: 21.7 % — ABNORMAL LOW (ref 36.0–46.0)

## 2012-04-25 LAB — BASIC METABOLIC PANEL
CO2: 23 mEq/L (ref 19–32)
Calcium: 8.2 mg/dL — ABNORMAL LOW (ref 8.4–10.5)
Creatinine, Ser: 0.52 mg/dL (ref 0.50–1.10)
GFR calc Af Amer: >90 mL/min (ref >90)
GFR calc non Af Amer: 89 mL/min — ABNORMAL LOW (ref >90)
Sodium: 124 mEq/L — ABNORMAL LOW (ref 135–145)

## 2012-04-25 LAB — CBC
MCH: 35.3 pg — ABNORMAL HIGH (ref 26.0–34.0)
MCV: 100.5 fL — ABNORMAL HIGH (ref 78.0–100.0)
Platelets: 138 10*3/uL — ABNORMAL LOW (ref 150–400)
RBC: 2.07 MIL/uL — ABNORMAL LOW (ref 3.87–5.11)
RDW: 15.3 % (ref 11.5–15.5)

## 2012-04-25 LAB — PREPARE RBC (CROSSMATCH)

## 2012-04-25 MED ORDER — FUROSEMIDE 10 MG/ML IJ SOLN
20.0000 mg | Freq: Once | INTRAMUSCULAR | Status: AC
  Administered 2012-04-25: 20 mg via INTRAVENOUS
  Filled 2012-04-25: qty 2

## 2012-04-25 NOTE — Progress Notes (Signed)
Pt Hgb 7.2 VS WNL however pt is very weak Norval Gable. PA notified new order received to transfer 2 units PRBCs Juanjesus Pepperman Annabess 5:39 AM

## 2012-04-25 NOTE — Plan of Care (Signed)
Problem: Phase I Progression Outcomes Goal: Dangle evening of surgery Outcome: Not Met (add Reason)  Pt c/o increased pain and states she is unable

## 2012-04-25 NOTE — Progress Notes (Signed)
Physical Therapy Treatment Patient Details Name: [name] [name] MRN: [id number] DOB: 08/20/1930 Today's Date: 04/25/2012 Time: 4098-1191 PT Time Calculation (min): 10 min  PT Assessment / Plan / Recommendation Comments on Treatment Session  Pt. receiving blood. was very fatigued and required 2 assist to stand and take a few steps back to bed.     Follow Up Recommendations  Post acute inpatient rehab;Supervision/Assistance - 24 hour    Barriers to Discharge Decreased caregiver support      Equipment Recommendations  None recommended by PT    Recommendations for Other Services    Frequency 7X/week   Plan Discharge plan remains appropriate;Frequency remains appropriate    Precautions / Restrictions Precautions Precautions: Knee Required Braces or Orthoses: Knee Immobilizer - Right Knee Immobilizer - Right: Discontinue once straight leg raise with < 10 degree lag Restrictions RLE Weight Bearing: Weight bearing as tolerated   Pertinent Vitals/Pain Pain R knee 8/10. RN notified and brought meds.    Mobility  Bed Mobility Bed Mobility: Sit to Supine Supine to Sit: 2: Max assist Details for Bed Mobility Assistance: assist to get legs onto bed. Transfers Transfers: Sit to Stand;Stand to Sit;Stand Pivot Transfers Sit to Stand: 1: +2 Total assist;From chair/3-in-1 Sit to Stand: Patient Percentage: 60% Stand to Sit: To bed;1: +2 Total assist Stand to Sit: Patient Percentage: 50% Stand Pivot Transfers: 1: +2 Total assist Stand Pivot Transfers: Patient Percentage: 50% Details for Transfer Assistance: pt. required extensive standing and balancing assistance to transfer to bed from recliner. much difficulty with WB on RLE. pt very fatigued. Ambulation/Gait Ambulation/Gait Assistance: Not tested (comment)    Exercises     PT Diagnosis: Difficulty walking;Acute pain  PT Problem List: Decreased strength;Decreased range of motion;Decreased activity tolerance;Decreased mobility;Decreased  knowledge of precautions;Pain;Decreased safety awareness;Decreased knowledge of use of DME PT Treatment Interventions: DME instruction;Gait training;Functional mobility training;Therapeutic activities;Therapeutic exercise;Patient/family education   PT Goals Acute Rehab PT Goals PT Goal Formulation: With patient Time For Goal Achievement: 05/02/12 Potential to Achieve Goals: Good Pt will go Supine/Side to Sit: with supervision PT Goal: Supine/Side to Sit - Progress: Goal set today Pt will go Sit to Supine/Side: with supervision PT Goal: Sit to Supine/Side - Progress: Progressing toward goal Pt will go Sit to Stand: with supervision PT Goal: Sit to Stand - Progress: Progressing toward goal Pt will go Stand to Sit: with supervision PT Goal: Stand to Sit - Progress: Progressing toward goal Pt will Ambulate: 51 - 150 feet;with supervision;with least restrictive assistive device PT Goal: Ambulate - Progress: Goal set today  Visit Information  Last PT Received On: 04/25/12 Assistance Needed: +2    Subjective Data  Subjective: I need pain medicine   Cognition  Overall Cognitive Status: Appears within functional limits for tasks assessed/performed Arousal/Alertness: Awake/alert Orientation Level: Appears intact for tasks assessed Behavior During Session: [facility] for tasks performed    Balance     End of Session PT - End of Session Equipment Utilized During Treatment: Gait belt;Right knee immobilizer Activity Tolerance: Patient limited by fatigue;Patient limited by pain Patient left: in bed;with call bell/phone within reach;with family/visitor present Nurse Communication: Mobility status;Patient requests pain meds   GP     Rada Hay 04/25/2012, 5:26 PM

## 2012-04-25 NOTE — Progress Notes (Signed)
   Subjective: 1 Day Post-Op Procedure(s) (LRB): TOTAL KNEE ARTHROPLASTY (Right) Patient reports pain as mild.   Patient seen in rounds with Dr. Lequita Halt. Patient is well, and has had no acute complaints or problems, She denies shortness of breath and chest pain. She did have a rough night last night but is feeling somewhat better this morning. We will start therapy today . Plan is to go Skilled nursing facility after hospital stay.  Objective: Vital signs in last 24 hours: Temp:  [94.3 F (34.6 C)-98.5 F (36.9 C)] 98.2 F (36.8 C) (10/01 0447) Pulse Rate:  [65-81] 72  (10/01 0447) Resp:  [7-20] 16  (10/01 0800) BP: (113-164)/(41-76) 138/69 mmHg (10/01 0447) SpO2:  [97 %-100 %] 100 % (10/01 0800) Weight:  [69.854 kg (154 lb)] 69.854 kg (154 lb) (09/30 1505)  Intake/Output from previous day:  Intake/Output Summary (Last 24 hours) at 04/25/12 0842 Last data filed at 04/25/12 0820  Gross per 24 hour  Intake   3109 ml  Output   2085 ml  Net   1024 ml    Intake/Output this shift: Total I/O In: 360 [P.O.:360] Out: -   Labs:  Basename 04/25/12 0815 04/25/12 0348 04/24/12 1117  HGB 7.6* 7.2* 13.3    Basename 04/25/12 0815 04/25/12 0348  WBC -- 8.0  RBC -- 2.07*  HCT 21.7* 20.8*  PLT -- 138*    Basename 04/25/12 0348 04/24/12 1117  NA 124* 129*  K 4.2 3.9  CL 92* --  CO2 23 --  BUN 6 --  CREATININE 0.52 --  GLUCOSE 138* 116*  CALCIUM 8.2* --    EXAM General - Patient is Alert and Oriented Extremity - Neurologically intact Neurovascular intact Dorsiflexion/Plantar flexion intact Dressing - dressing C/D/I Motor Function - intact, moving foot and toes well on exam.  Hemovac pulled without difficulty.  Past Medical History  Diagnosis Date  . DDD (degenerative disc disease)   . Osteopenia   . GERD (gastroesophageal reflux disease)   . Hypertension   . Hyperlipidemia   . Vertigo, benign positional   . Hypothyroidism   . Anxiety   . H/O hiatal hernia   .  Anemia   . Eczema   . Septicemia 2002    following UTI    Assessment/Plan: 1 Day Post-Op Procedure(s) (LRB): TOTAL KNEE ARTHROPLASTY (Right) Principal Problem:  *OA (osteoarthritis) of knee Postoperative acute blood loss anemia  Advance diet Up with therapy Continue foley due to blood transfusion; will continue until blood transfusion complete Discharge to SNF tentative Thursday DVT Prophylaxis - Xarelto Weight-Bearing as tolerated to right leg No vaccines. D/C O2 and Pulse OX and try on Room Air DC foley after up walking after completion of transfusion.   Will transfuse 2 units. Repeat Hgb still low at 7.6. Patient will start therapy after transfusion. Can get patient up in chair during transfusion. Plan for SNF Thursday.  Shanele Nissan LAUREN 04/25/2012, 8:42 AM

## 2012-04-25 NOTE — Evaluation (Signed)
Physical Therapy Evaluation Patient Details Name: MIRONDA AIELLO MRN: [id number] DOB: 04/25/1931 Today's Date: 04/25/2012 Time: 4098-1191 PT Time Calculation (min): 27 min  PT Assessment / Plan / Recommendation Clinical Impression  Pt s/p R TKR.  Pt plans to d/c to SNF as she lives alone.  Pt would benefit from acute PT services in order to improve independence with bed mobility, transfers, and ambulation by increasing strength and ROM to prepare for d/c to SNF.     PT Assessment  Patient needs continued PT services    Follow Up Recommendations  Post acute inpatient rehab    Barriers to Discharge Decreased caregiver support      Equipment Recommendations  None recommended by PT    Recommendations for Other Services     Frequency 7X/week    Precautions / Restrictions Precautions Precautions: Knee Required Braces or Orthoses: Knee Immobilizer - Right Knee Immobilizer - Right: Discontinue once straight leg raise with < 10 degree lag Restrictions RLE Weight Bearing: Weight bearing as tolerated   Pertinent Vitals/Pain 3/10 R knee, premedicated, repositioned      Mobility  Bed Mobility Bed Mobility: Supine to Sit Supine to Sit: 3: Mod assist;HOB elevated Details for Bed Mobility Assistance: assist for R LE and trunk, verbal cues for technique Transfers Transfers: Stand to Sit;Sit to Stand;Stand Pivot Transfers Sit to Stand: 3: Mod assist;From bed;With upper extremity assist;From elevated surface Stand to Sit: 3: Mod assist;To chair/3-in-1;With upper extremity assist Stand Pivot Transfers: 3: Mod assist Details for Transfer Assistance: verbal cues for safe technique, pt with difficulty WB through R LE and poor upper body strength to use RW to assist, increased verbal cues for technique with pivot and a couple small steps back to recliner Ambulation/Gait Ambulation/Gait Assistance: Not tested (comment)    Shoulder Instructions     Exercises     PT Diagnosis: Difficulty  walking;Acute pain  PT Problem List: Decreased strength;Decreased range of motion;Decreased activity tolerance;Decreased mobility;Decreased knowledge of precautions;Pain;Decreased safety awareness;Decreased knowledge of use of DME PT Treatment Interventions: DME instruction;Gait training;Functional mobility training;Therapeutic activities;Therapeutic exercise;Patient/family education   PT Goals Acute Rehab PT Goals PT Goal Formulation: With patient Time For Goal Achievement: 05/02/12 Potential to Achieve Goals: Good Pt will go Supine/Side to Sit: with supervision PT Goal: Supine/Side to Sit - Progress: Goal set today Pt will go Sit to Supine/Side: with supervision PT Goal: Sit to Supine/Side - Progress: Goal set today Pt will go Sit to Stand: with supervision PT Goal: Sit to Stand - Progress: Goal set today Pt will go Stand to Sit: with supervision PT Goal: Stand to Sit - Progress: Goal set today Pt will Ambulate: 51 - 150 feet;with supervision;with least restrictive assistive device PT Goal: Ambulate - Progress: Goal set today  Visit Information  Last PT Received On: 04/25/12 Assistance Needed: +2    Subjective Data  Subjective: I'm so cold.   Prior Functioning  Home Living Lives With: Alone Available Help at Discharge: Skilled Nursing Facility Home Adaptive Equipment: Walker - rolling;Straight cane Prior Function Level of Independence: Independent with assistive device(s) Communication Communication: No difficulties    Cognition  Overall Cognitive Status: Appears within functional limits for tasks assessed/performed Arousal/Alertness: Awake/alert Orientation Level: Appears intact for tasks assessed Behavior During Session: [facility] for tasks performed    Extremity/Trunk Assessment Right Upper Extremity Assessment RUE ROM/Strength/Tone: Deficits RUE ROM/Strength/Tone Deficits: functional weakness observed with RW Left Upper Extremity Assessment LUE ROM/Strength/Tone:  Deficits LUE ROM/Strength/Tone Deficits: functional weakness observed with RW Right Lower  Extremity Assessment RLE ROM/Strength/Tone: Deficits RLE ROM/Strength/Tone Deficits: pt unable to lift against gravity for mobility, maintained KI Left Lower Extremity Assessment LLE ROM/Strength/Tone: WFL for tasks assessed   Balance    End of Session PT - End of Session Equipment Utilized During Treatment: Gait belt;Right knee immobilizer Activity Tolerance: Patient limited by fatigue;Patient limited by pain Patient left: in chair;with call bell/phone within reach Nurse Communication: Mobility status (+2)  GP     Laelani Vasko,KATHrine E 04/25/2012, 3:35 PM Pager: [phone]

## 2012-04-25 NOTE — Progress Notes (Signed)
Clinical Social Work Department BRIEF PSYCHOSOCIAL ASSESSMENT 04/25/2012  Patient:  [name] [name], [name] [name]     Account Number:  [id number]     Admit date:  04/24/2012  Clinical Social Worker:  ,  Date/Time:  04/25/2012 12:00 M  Referred by:    Date Referred:  04/25/2012 Referred for  SNF Placement   Other Referral:   Interview type:  Patient Other interview type:    PSYCHOSOCIAL DATA Living Status:  ALONE Admitted from facility:   Level of care:   Primary support name:  [phone] Primary support relationship to patient:  CHILD, ADULT Degree of support available:   Adequate    CURRENT CONCERNS Current Concerns  Post-Acute Placement   Other Concerns:    SOCIAL WORK ASSESSMENT / PLAN CSW consulted by RN re:SNF placement. CSW spoke with patient who stated he pre-registered with [facility]. CSW confirmed with Sheliah Hatch that she will be admitted at d/c. CSW provided the patient with encouragement and support for recovery. CSW will continue to follow patient for d/c needs.   Assessment/plan status:  Information/Referral to [facility] Other assessment/ plan:   Information/referral to community resources:    PATIENT'S/FAMILY'S RESPONSE TO PLAN OF CARE: Patient thanked CSW for assisting in D/C planning and was agreeable to SNF placement at [place].

## 2012-04-25 NOTE — Progress Notes (Signed)
Clinical Social Work Department CLINICAL SOCIAL WORK PLACEMENT NOTE 04/25/2012  Patient:  [name] [name], [name] [name]  Account Number:  [id number] Admit date:  04/24/2012  Clinical Social Worker:  Cori Razor, LCSW  Date/time:  04/25/2012 03:10 PM  Clinical Social Work is seeking post-discharge placement for this patient at the following level of care:   SKILLED NURSING   (*CSW will update this form in Epic as items are completed)     Patient/family provided with [facility] Department of Clinical Social Work's list of facilities offering this level of care within the geographic area requested by the patient (or if unable, by the patient's family).    Patient/family informed of their freedom to choose among providers that offer the needed level of care, that participate in Medicare, Medicaid or managed care program needed by the patient, have an available bed and are willing to accept the patient.    Patient/family informed of [facility]' ownership interest in [facility], as well as of the fact that they are under no obligation to receive care at this facility.  PASARR submitted to EDS on 04/25/2012 PASARR number received from EDS on   FL2 transmitted to all facilities in geographic area requested by pt/family on  04/25/2012 FL2 transmitted to all facilities within larger geographic area on   Patient informed that his/her managed care company has contracts with or will negotiate with  certain facilities, including the following:     Patient/family informed of bed offers received:  04/25/2012 Patient chooses bed at [facility] Physician recommends and patient chooses bed at    Patient to be transferred to  on   Patient to be transferred to facility by   The following physician request were entered in Epic:   Additional Comments: Cori Razor LCSW [phone]

## 2012-04-26 LAB — TYPE AND SCREEN
ABO/RH(D): O POS
Unit division: 0

## 2012-04-26 LAB — BASIC METABOLIC PANEL
BUN: 4 mg/dL — ABNORMAL LOW (ref 6–23)
Chloride: 86 mEq/L — ABNORMAL LOW (ref 96–112)
GFR calc Af Amer: >90 mL/min (ref >90)
Glucose, Bld: 120 mg/dL — ABNORMAL HIGH (ref 70–99)
Potassium: 3.4 mEq/L — ABNORMAL LOW (ref 3.5–5.1)

## 2012-04-26 LAB — CBC
HCT: 27.5 % — ABNORMAL LOW (ref 36.0–46.0)
Hemoglobin: 9.9 g/dL — ABNORMAL LOW (ref 12.0–15.0)
WBC: 10.8 10*3/uL — ABNORMAL HIGH (ref 4.0–10.5)

## 2012-04-26 MED ORDER — POTASSIUM CHLORIDE CRYS ER 20 MEQ PO TBCR
40.0000 meq | EXTENDED_RELEASE_TABLET | Freq: Every day | ORAL | Status: AC
  Administered 2012-04-26 – 2012-04-27 (×2): 40 meq via ORAL
  Filled 2012-04-26 (×2): qty 2

## 2012-04-26 MED ORDER — NON FORMULARY: 20.0000 mg | Freq: Every day | Status: DC

## 2012-04-26 NOTE — Progress Notes (Signed)
Physical Therapy Treatment Patient Details Name: SARENA JEZEK MRN: [id number] DOB: 1930-09-29 Today's Date: 04/26/2012 Time: 7829-5621 PT Time Calculation (min): 32 min  PT Assessment / Plan / Recommendation Comments on Treatment Session  POD #2 R TKR am session. Pt still groggy and required extra, extra time just to amb to and from the bathroom. Pt plans to D/C to SNF for rehab.    Follow Up Recommendations   (skilled nursing)    Barriers to Discharge        Equipment Recommendations  None recommended by PT;3 in 1 bedside comode    Recommendations for Other Services    Frequency 7X/week   Plan Discharge plan remains appropriate    Precautions / Restrictions Precautions Precautions: Knee Required Braces or Orthoses: Knee Immobilizer - Right Knee Immobilizer - Right: Discontinue once straight leg raise with < 10 degree lag Restrictions Weight Bearing Restrictions: No RLE Weight Bearing: Weight bearing as tolerated    Pertinent Vitals/Pain C/o 5/10 pain with act     Mobility  Bed Mobility Bed Mobility: Not assessed Supine to Sit: 3: Mod assist Details for Bed Mobility Assistance: Pt OOB in recliner  Transfers Transfers: Sit to Stand;Stand to Sit Sit to Stand: 2: Max assist;From chair/3-in-1;From toilet Sit to Stand: Patient Percentage: 60% Stand to Sit: 2: Max assist;To chair/3-in-1;To toilet Details for Transfer Assistance: 75% VC's on proper technique and jand placement.  Pt cont to be groggy and delayed.  Ambulation/Gait Ambulation/Gait Assistance: 2: Max assist Ambulation Distance (Feet): 22 Feet Assistive device: Rolling walker Ambulation/Gait Assistance Details: 100% VC's on proper sequencing and safety with turns. Still very groggy. Gait Pattern: Step-to pattern;Decreased stance time - right;Trunk flexed;Decreased stride length;Shuffle Gait velocity: decreased.  Extra, extra time needed to amb to and from BR. General Gait Details: Groggy Stairs: No     PT  Goals    progressing   Visit Information  Last PT Received On: 04/26/12 Assistance Needed: +1    Subjective Data   I feel nausea   Cognition  Overall Cognitive Status: Appears within functional limits for tasks assessed/performed Arousal/Alertness: Awake/alert Orientation Level: Appears intact for tasks assessed Behavior During Session: [facility] for tasks performed    Balance   poor  End of Session PT - End of Session Equipment Utilized During Treatment: Gait belt;Right knee immobilizer Activity Tolerance: Patient limited by fatigue Patient left: in chair;with call bell/phone within reach Nurse Communication: Mobility status  Felecia Shelling  PTA [facility]  Acute  Rehab Pager     [phone]

## 2012-04-26 NOTE — Progress Notes (Signed)
   Subjective: 2 Days Post-Op Procedure(s) (LRB): TOTAL KNEE ARTHROPLASTY (Right) Patient reports pain as moderate.   Patient seen in rounds for Dr. Lequita Halt.  She feels a little better today after the blood.  Will see how she does today. Patient is well, but has had some minor complaints of pain in the knee, requiring pain medications Plan is to go Skilled nursing facility after hospital stay.  Objective: Vital signs in last 24 hours: Temp:  [98 F (36.7 C)-99.3 F (37.4 C)] 98 F (36.7 C) (10/02 1330) Pulse Rate:  [74-89] 74  (10/02 1330) Resp:  [14-16] 16  (10/02 1330) BP: (135-186)/(52-73) 147/70 mmHg (10/02 1330) SpO2:  [92 %-100 %] 97 % (10/02 1330)  Intake/Output from previous day:  Intake/Output Summary (Last 24 hours) at 04/26/12 1410 Last data filed at 04/26/12 1300  Gross per 24 hour  Intake 1582.5 ml  Output   2700 ml  Net -1117.5 ml    Intake/Output this shift: Total I/O In: 120 [P.O.:120] Out: -   Labs:  Basename 04/26/12 0405 04/25/12 0815 04/25/12 0348 04/24/12 1117  HGB 9.9* 7.6* 7.2* 13.3    Basename 04/26/12 0405 04/25/12 0815 04/25/12 0348  WBC 10.8* -- 8.0  RBC 2.97* -- 2.07*  HCT 27.5* 21.7* --  PLT 120* -- 138*    Basename 04/26/12 0405 04/25/12 0348  NA 121* 124*  K 3.4* 4.2  CL 86* 92*  CO2 27 23  BUN 4* 6  CREATININE 0.56 0.52  GLUCOSE 120* 138*  CALCIUM 8.5 8.2*   No results found for this basename: LABPT:2,INR:2 in the last 72 hours  EXAM General - Patient is Alert, Appropriate and Oriented Extremity - Neurovascular intact Sensation intact distally Dorsiflexion/Plantar flexion intact Dressing/Incision - clean, dry, no drainage, healing Motor Function - intact, moving foot and toes well on exam.   Past Medical History  Diagnosis Date  . DDD (degenerative disc disease)   . Osteopenia   . GERD (gastroesophageal reflux disease)   . Hypertension   . Hyperlipidemia   . Vertigo, benign positional   . Hypothyroidism   .  Anxiety   . H/O hiatal hernia   . Anemia   . Eczema   . Septicemia 2002    following UTI    Assessment/Plan: 2 Days Post-Op Procedure(s) (LRB): TOTAL KNEE ARTHROPLASTY (Right) Principal Problem:  *OA (osteoarthritis) of knee   Up with therapy Plan for discharge tomorrow Discharge to SNF - Plan for [place]  DVT Prophylaxis - Xarelto Weight-Bearing as tolerated to right leg  [name] [name] 04/26/2012, 2:10 PM

## 2012-04-26 NOTE — Evaluation (Signed)
Occupational Therapy Evaluation Patient Details Name: [name] [name] MRN: [id number] DOB: Jun 23, 1931 Today's Date: 04/26/2012 Time: 7829-5621 OT Time Calculation (min): 28 min  OT Assessment / Plan / Recommendation Clinical Impression  This 78 year old female was admitted for R TKA.  She will benefit from continued OT to increase independence with adls with min A level goals in acute, overall.      OT Assessment  Patient needs continued OT Services    Follow Up Recommendations  Skilled nursing facility    Barriers to Discharge      Equipment Recommendations  None recommended by PT;3 in 1 bedside comode    Recommendations for Other Services    Frequency  Min 2X/week    Precautions / Restrictions Precautions Precautions: Knee Required Braces or Orthoses: Knee Immobilizer - Right Knee Immobilizer - Right: Discontinue once straight leg raise with < 10 degree lag Restrictions Weight Bearing Restrictions: No RLE Weight Bearing: Weight bearing as tolerated   Pertinent Vitals/Pain 2/10 sitting R knee; 5 standing.  repositioned    ADL  Grooming: Performed;Wash/dry hands;Wash/dry face;Supervision/safety (due to grogginess) Where Assessed - Grooming: Unsupported sitting Upper Body Bathing: Performed;Supervision/safety Where Assessed - Upper Body Bathing: Unsupported sitting Lower Body Bathing: Performed;Maximal assistance Where Assessed - Lower Body Bathing: Supported sit to stand Upper Body Dressing: Performed;Minimal assistance Where Assessed - Upper Body Dressing: Unsupported sitting Lower Body Dressing: Performed;+1 Total assistance Where Assessed - Lower Body Dressing: Supported sit to stand Toilet Transfer: Simulated;+2 Total assistance (max cues) Toilet Transfer: Patient Percentage: 60% Toilet Transfer Method: Surveyor, minerals: Other (comment) (bed to chair) Toileting - Clothing Manipulation and Hygiene: Simulated;Maximal assistance Where Assessed -  Toileting Clothing Manipulation and Hygiene: Standing Transfers/Ambulation Related to ADLs: Pt very slow with steps.  Tends to flex trunk in standing; max multimodal cues given ADL Comments: did not introduce AE on this visit    OT Diagnosis: Generalized weakness  OT Problem List: Decreased strength;Decreased activity tolerance;Decreased knowledge of use of DME or AE;Pain OT Treatment Interventions: Self-care/ADL training;DME and/or AE instruction;Patient/family education   OT Goals Acute Rehab OT Goals OT Goal Formulation: With patient Time For Goal Achievement: 05/03/12 Potential to Achieve Goals: Good ADL Goals Pt Will Perform Lower Body Bathing: with min assist;Sit to stand from chair;with adaptive equipment ADL Goal: Lower Body Bathing - Progress: Goal set today Pt Will Perform Lower Body Dressing: with mod assist;Sit to stand from chair;with adaptive equipment ADL Goal: Lower Body Dressing - Progress: Goal set today Pt Will Transfer to Toilet: with min assist;3-in-1;Stand pivot transfer ADL Goal: Toilet Transfer - Progress: Goal set today Pt Will Perform Toileting - Clothing Manipulation: with min assist;Standing ADL Goal: Toileting - Clothing Manipulation - Progress: Goal set today Pt Will Perform Toileting - Hygiene: with min assist;Sit to stand from 3-in-1/toilet ADL Goal: Toileting - Hygiene - Progress: Goal set today  Visit Information  Last OT Received On: 04/26/12 Assistance Needed: +2 (safety; pt groggy)    Subjective Data  Subjective: I'm so sleepy.  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    Prior Functioning     Home Living Lives With: Alone Available Help at Discharge: Skilled Nursing Facility Prior Function Level of Independence: Independent with assistive device(s) Communication Communication: No difficulties Dominant Hand: Right         Vision/Perception     Cognition  Overall Cognitive Status: Appears within  functional limits for tasks assessed/performed Arousal/Alertness: Awake/alert Orientation Level: Appears intact  for tasks assessed Behavior During Session: [facility] for tasks performed    Extremity/Trunk Assessment Right Upper Extremity Assessment RUE ROM/Strength/Tone: [facility] for tasks assessed Left Upper Extremity Assessment LUE ROM/Strength/Tone: [facility] for tasks assessed     Mobility Bed Mobility Supine to Sit: 3: Mod assist Transfers Sit to Stand: 1: +2 Total assist;From bed;From elevated surface;With upper extremity assist Sit to Stand: Patient Percentage: 60% Details for Transfer Assistance: mod cues for UE/LE placement     Shoulder Instructions     Exercise     Balance     End of Session OT - End of Session Activity Tolerance: Patient limited by fatigue Patient left: in chair;with call bell/phone within reach CPM Right Knee CPM Right Knee: Off  GO     Deanza Upperman 04/26/2012, 9:42 AM Marica Otter, OTR/L [phone] 04/26/2012

## 2012-04-27 DIAGNOSIS — Z9289 Personal history of other medical treatment: Secondary | ICD-10-CM

## 2012-04-27 DIAGNOSIS — D62 Acute posthemorrhagic anemia: Secondary | ICD-10-CM

## 2012-04-27 DIAGNOSIS — E876 Hypokalemia: Secondary | ICD-10-CM

## 2012-04-27 LAB — CBC
HCT: 22.3 % — ABNORMAL LOW (ref 36.0–46.0)
Hemoglobin: 8 g/dL — ABNORMAL LOW (ref 12.0–15.0)
MCH: 32.8 pg (ref 26.0–34.0)
MCHC: 35.9 g/dL (ref 30.0–36.0)

## 2012-04-27 MED ORDER — POTASSIUM CHLORIDE CRYS ER 20 MEQ PO TBCR: 40.0000 meq | EXTENDED_RELEASE_TABLET | Freq: Every day | ORAL | Status: DC

## 2012-04-27 MED ORDER — POLYETHYLENE GLYCOL 3350 17 G PO PACK: 17.0000 g | PACK | Freq: Every day | ORAL | Status: DC | PRN

## 2012-04-27 MED ORDER — ONDANSETRON HCL 4 MG PO TABS: 4.0000 mg | ORAL_TABLET | Freq: Four times a day (QID) | ORAL | Status: DC | PRN

## 2012-04-27 MED ORDER — RIVAROXABAN 10 MG PO TABS: 10.0000 mg | ORAL_TABLET | Freq: Every day | ORAL | Status: DC

## 2012-04-27 MED ORDER — DSS 100 MG PO CAPS: 100.0000 mg | ORAL_CAPSULE | Freq: Two times a day (BID) | ORAL | Status: DC

## 2012-04-27 MED ORDER — FLEET ENEMA 7-19 GM/118ML RE ENEM
1.0000 | ENEMA | Freq: Once | RECTAL | Status: DC
  Filled 2012-04-27: qty 1

## 2012-04-27 MED ORDER — POLYSACCHARIDE IRON COMPLEX 150 MG PO CAPS: 150.0000 mg | ORAL_CAPSULE | Freq: Two times a day (BID) | ORAL | Status: DC

## 2012-04-27 MED ORDER — ACETAMINOPHEN 325 MG PO TABS: 325.0000 mg | ORAL_TABLET | Freq: Four times a day (QID) | ORAL | Status: DC | PRN

## 2012-04-27 MED ORDER — BISACODYL 10 MG RE SUPP: 10.0000 mg | Freq: Every day | RECTAL | Status: DC | PRN

## 2012-04-27 MED ORDER — FLEET ENEMA 7-19 GM/118ML RE ENEM: 1.0000 | ENEMA | Freq: Every day | RECTAL | Status: DC | PRN

## 2012-04-27 MED ORDER — POLYSACCHARIDE IRON COMPLEX 150 MG PO CAPS
150.0000 mg | ORAL_CAPSULE | Freq: Two times a day (BID) | ORAL | Status: DC
  Administered 2012-04-27: 150 mg via ORAL
  Filled 2012-04-27 (×2): qty 1

## 2012-04-27 MED ORDER — TRAMADOL HCL 50 MG PO TABS: 50.0000 mg | ORAL_TABLET | Freq: Four times a day (QID) | ORAL | Status: DC | PRN

## 2012-04-27 MED ORDER — OXYCODONE HCL 5 MG PO TABS: 5.0000 mg | ORAL_TABLET | ORAL | Status: DC | PRN

## 2012-04-27 MED ORDER — METHOCARBAMOL 500 MG PO TABS: 500.0000 mg | ORAL_TABLET | Freq: Four times a day (QID) | ORAL | Status: DC | PRN

## 2012-04-27 NOTE — Progress Notes (Signed)
Physical Therapy Treatment Patient Details Name: [name] [name] MRN: [id number] DOB: September 27, 1930 Today's Date: 04/27/2012 Time: 4098-1191 PT Time Calculation (min): 32 min  PT Assessment / Plan / Recommendation Comments on Treatment Session  POD #3 am session.  Assisted pt out of recliner to amb to BR.  Assisted with hygiene then amb back to recliner.  Pt required increased time and MAX VC's for safety and sequencing.  Pt still remains groggy and slow moving.    Follow Up Recommendations   (skilled nursing)    Barriers to Discharge        Equipment Recommendations       Recommendations for Other Services    Frequency 7X/week   Plan Discharge plan remains appropriate    Precautions / Restrictions Precautions Precautions: Knee Required Braces or Orthoses: Knee Immobilizer - Right Knee Immobilizer - Right: Discontinue once straight leg raise with < 10 degree lag Restrictions Weight Bearing Restrictions: No RLE Weight Bearing: Weight bearing as tolerated    Pertinent Vitals/Pain C/o 6/10 R knee pain with act ICE applied    Mobility  Bed Mobility Bed Mobility: Not assessed Details for Bed Mobility Assistance: Pt OOB in recliner  Transfers Transfers: Sit to Stand;Stand to Sit Sit to Stand: 2: Max assist;From toilet;From chair/3-in-1 Stand to Sit: 2: Max assist;To chair/3-in-1;To toilet Details for Transfer Assistance: 75% VC's on proper tech and hand placement plus turn completion prior to sit.  Ambulation/Gait Ambulation/Gait Assistance: 3: Mod assist Ambulation Distance (Feet): 20 Feet (10' x 2) Assistive device: Rolling walker Ambulation/Gait Assistance Details: 75% VC's on proper sequencing and increased time.  Gait Pattern: Step-to pattern;Decreased stance time - right;Decreased stride length;Trunk flexed Gait velocity: decreased but better than yesterday    PT Goals       progressing    Visit Information  Last PT Received On: 04/27/12 Assistance Needed: +1                  End of Session PT - End of Session Equipment Utilized During Treatment: Gait belt;Right knee immobilizer Activity Tolerance: Patient limited by fatigue (groggy) Patient left: in chair;with call bell/phone within reach Nurse Communication:  (amb pt to BR but still unable to void)  Felecia Shelling  PTA [facility]  Acute  Rehab Pager     [phone]

## 2012-04-27 NOTE — Progress Notes (Signed)
Pt transported to [facility] via [place]...all paperwork sent to [facility] with transporter.

## 2012-04-27 NOTE — Discharge Summary (Signed)
Physician Discharge Summary   Patient ID: Lenda Baratta Dutter MRN: [id number] DOB/AGE: [birth year] 76 y.o.  Admit date: 04/24/2012 Discharge date: 04/27/2012  Primary Diagnosis: Osteoarthritis Right knee   Admission Diagnoses:  Past Medical History  Diagnosis Date  . DDD (degenerative disc disease)   . Osteopenia   . GERD (gastroesophageal reflux disease)   . Hypertension   . Hyperlipidemia   . Vertigo, benign positional   . Hypothyroidism   . Anxiety   . H/O hiatal hernia   . Anemia   . Eczema   . Septicemia 2002    following UTI   Discharge Diagnoses:   Principal Problem:  *OA (osteoarthritis) of knee Active Problems:  Hyponatremia  Postop Acute blood loss anemia  Postop Transfusion  Postop Hypokalemia  Procedure:  Procedure(s) (LRB): TOTAL KNEE ARTHROPLASTY (Right)   Consults: None  HPI: Madgeline Rayo Dooling is a 78 y.o. year old female with end stage OA of her right knee with progressively worsening pain and dysfunction. She has constant pain, with activity and at rest and significant functional deficits with difficulties even with ADLs. She has had extensive non-op management including analgesics, injections of cortisone and viscosupplements, and home exercise program, but remains in significant pain with significant dysfunction.Radiographs show bone on bone arthritis lateral and patellofemoral. She presents now for right Total Knee Arthroplasty.   Laboratory Data: Hospital Outpatient Visit on 04/18/2012  Component Date Value Range Status  . aPTT 04/18/2012 33  24 - 37 seconds Final  . WBC 04/18/2012 7.0  4.0 - 10.5 K/uL Final  . RBC 04/18/2012 3.34* 3.87 - 5.11 MIL/uL Final  . Hemoglobin 04/18/2012 11.6* 12.0 - 15.0 g/dL Final  . HCT 40/98/1191 33.1* 36.0 - 46.0 % Final  . MCV 04/18/2012 99.1  78.0 - 100.0 fL Final  . MCH 04/18/2012 34.7* 26.0 - 34.0 pg Final  . MCHC 04/18/2012 35.0  30.0 - 36.0 g/dL Final  . RDW 47/82/9562 14.8  11.5 - 15.5 % Final  . Platelets 04/18/2012  234  150 - 400 K/uL Final  . Sodium 04/18/2012 120* 135 - 145 mEq/L Final  . Potassium 04/18/2012 3.5  3.5 - 5.1 mEq/L Final  . Chloride 04/18/2012 83* 96 - 112 mEq/L Final  . CO2 04/18/2012 25  19 - 32 mEq/L Final  . Glucose, Bld 04/18/2012 117* 70 - 99 mg/dL Final  . BUN 13/02/6577 10  6 - 23 mg/dL Final  . Creatinine, Ser 04/18/2012 0.50  0.50 - 1.10 mg/dL Final  . Calcium 46/96/2952 9.5  8.4 - 10.5 mg/dL Final  . Total Protein 04/18/2012 8.0  6.0 - 8.3 g/dL Final  . Albumin 84/13/2440 4.5  3.5 - 5.2 g/dL Final  . AST 05/22/2535 36  0 - 37 U/L Final  . ALT 04/18/2012 16  0 - 35 U/L Final  . Alkaline Phosphatase 04/18/2012 68  39 - 117 U/L Final  . Total Bilirubin 04/18/2012 0.4  0.3 - 1.2 mg/dL Final  . GFR calc non Af Amer 04/18/2012 90* >90 mL/min Final  . GFR calc Af Amer 04/18/2012 >90  >90 mL/min Final   Comment:                                 The eGFR has been calculated                          using the CKD  EPI equation.                          This calculation has not been                          validated in all clinical                          situations.                          eGFR's persistently                          <90 mL/min signify                          possible Chronic Kidney Disease.  Marland Kitchen Prothrombin Time 04/18/2012 12.5  11.6 - 15.2 seconds Final  . INR 04/18/2012 0.94  0.00 - 1.49 Final  . Color, Urine 04/18/2012 YELLOW  YELLOW Final  . APPearance 04/18/2012 CLEAR  CLEAR Final  . Specific Gravity, Urine 04/18/2012 1.009  1.005 - 1.030 Final  . pH 04/18/2012 6.0  5.0 - 8.0 Final  . Glucose, UA 04/18/2012 NEGATIVE  NEGATIVE mg/dL Final  . Hgb urine dipstick 04/18/2012 NEGATIVE  NEGATIVE Final  . Bilirubin Urine 04/18/2012 NEGATIVE  NEGATIVE Final  . Ketones, ur 04/18/2012 NEGATIVE  NEGATIVE mg/dL Final  . Protein, ur 09/81/1914 NEGATIVE  NEGATIVE mg/dL Final  . Urobilinogen, UA 04/18/2012 0.2  0.0 - 1.0 mg/dL Final  . Nitrite 78/29/5621 NEGATIVE   NEGATIVE Final  . Leukocytes, UA 04/18/2012 TRACE* NEGATIVE Final  . MRSA, PCR 04/18/2012 NEGATIVE  NEGATIVE Final  . Staphylococcus aureus 04/18/2012 NEGATIVE  NEGATIVE Final   Comment:                                 The Xpert SA Assay (FDA                          approved for NASAL specimens                          in patients over 49 years of age),                          is one component of                          a comprehensive surveillance                          program.  Test performance has                          been validated by [employer] for patients greater                          than  or equal to 29 year old.                          It is not intended                          to diagnose infection nor to                          guide or monitor treatment.  . Squamous Epithelial / LPF 04/18/2012 RARE  RARE Final  . WBC, UA 04/18/2012 3-6  <3 WBC/hpf Final  . RBC / HPF 04/18/2012 0-2  <3 RBC/hpf Final  . Bacteria, UA 04/18/2012 MANY* RARE Final    Basename 04/27/12 0400 04/26/12 0405 04/25/12 0815 04/25/12 0348 04/24/12 1117  HGB 8.0* 9.9* 7.6* 7.2* 13.3    Basename 04/27/12 0400 04/26/12 0405  WBC 9.1 10.8*  RBC 2.44* 2.97*  HCT 22.3* 27.5*  PLT 105* 120*    Basename 04/26/12 0405 04/25/12 0348  NA 121* 124*  K 3.4* 4.2  CL 86* 92*  CO2 27 23  BUN 4* 6  CREATININE 0.56 0.52  GLUCOSE 120* 138*  CALCIUM 8.5 8.2*   No results found for this basename: LABPT:2,INR:2 in the last 72 hours  X-Rays:No results found.  EKG:No orders found for this or any previous visit.   Hospital Course:  Shevonne Wolf Hippe is a 78 y.o. who was admitted to [facility]. They were brought to the operating room on 04/24/2012 and underwent Procedure(s): TOTAL KNEE ARTHROPLASTY.  Patient tolerated the procedure well and was later transferred to the recovery room and then to the orthopaedic floor for postoperative care.  They were given PO and  IV analgesics for pain control following their surgery.  They were given 24 hours of postoperative antibiotics of  Anti-infectives     Start     Dose/Rate Route Frequency Ordered Stop   04/24/12 1800   ceFAZolin (ANCEF) IVPB 1 g/50 mL premix        1 g 100 mL/hr over 30 Minutes Intravenous Every 6 hours 04/24/12 1514 04/25/12 0034   04/24/12 1001   ceFAZolin (ANCEF) 3 g in dextrose 5 % 50 mL IVPB        3 g 160 mL/hr over 30 Minutes Intravenous 60 min pre-op 04/24/12 1001 04/24/12 1136         and started on DVT prophylaxis in the form of Xarelto.   PT and OT were ordered for total joint protocol.  Discharge planning consulted to help with postop disposition and equipment needs.  Patient had a rough night on the evening of surgery and started to get up OOB with therapy on day one.  Hemovac drain was pulled without difficulty.  HGB down to 7.2. . Repeat Hgb still low at 7.6.  Transfused 2 units. Patient will start therapy after transfusion. Can get patient up in chair during transfusion. Plan for SNF Thursday. Continued to work with therapy into day two.  Dressing was changed on day two and the incision was healing well. Felt a little better after the blood.  By day three, the patient had progressed with therapy and meeting their goals.  Incision was healing well.  Patient was seen in rounds and was ready to go to the SNF.   Discharge Medications: Prior to Admission medications   Medication Sig Start Date End Date  Taking? Authorizing Provider  cloNIDine (CATAPRES) 0.1 MG tablet Take 0.1 mg by mouth at bedtime.   Yes Historical Provider, MD  diphenhydramine-acetaminophen (TYLENOL PM EXTRA STRENGTH) 25-500 MG TABS Take 2 tablets by mouth at bedtime as needed. For sleep   Yes Historical Provider, MD  levothyroxine (SYNTHROID, LEVOTHROID) 25 MCG tablet Take 25 mcg by mouth daily with breakfast. with full glass of water   Yes Historical Provider, MD  olmesartan-hydrochlorothiazide (BENICAR HCT)  20-12.5 MG per tablet Take 1 tablet by mouth daily after breakfast.    Yes Historical Provider, MD  omeprazole (PRILOSEC) 20 MG capsule Take 20 mg by mouth daily before breakfast.   Yes Historical Provider, MD  acetaminophen (TYLENOL) 325 MG tablet Take 1-2 tablets (325-650 mg total) by mouth every 6 (six) hours as needed. 04/27/12   Kanton Kamel Julien Girt, PA  ALPRAZolam (XANAX) 0.25 MG tablet Take 0.25 mg by mouth at bedtime as needed.    Historical Provider, MD  bisacodyl (DULCOLAX) 10 MG suppository Place 1 suppository (10 mg total) rectally daily as needed. 04/27/12   Innocence Schlotzhauer, PA  docusate sodium 100 MG CAPS Take 100 mg by mouth 2 (two) times daily. 04/27/12   Yaphet Smethurst Julien Girt, PA  iron polysaccharides (NIFEREX) 150 MG capsule Take 1 capsule (150 mg total) by mouth 2 (two) times daily. 04/27/12   Jalee Saine Julien Girt, PA  methocarbamol (ROBAXIN) 500 MG tablet Take 1 tablet (500 mg total) by mouth every 6 (six) hours as needed. 04/27/12   Kiylee Thoreson, PA  ondansetron (ZOFRAN) 4 MG tablet Take 1 tablet (4 mg total) by mouth every 6 (six) hours as needed for nausea. 04/27/12   Tuleen Mandelbaum, PA  oxyCODONE (OXY IR/ROXICODONE) 5 MG immediate release tablet Take 1-2 tablets (5-10 mg total) by mouth every 4 (four) hours as needed for pain. 04/27/12   Seon Gaertner, PA  polyethylene glycol (MIRALAX / GLYCOLAX) packet Take 17 g by mouth daily as needed. 04/27/12   Navy Rothschild Julien Girt, PA  rivaroxaban (XARELTO) 10 MG TABS tablet Take 1 tablet (10 mg total) by mouth daily with breakfast. Take Xarelto for two and a half more weeks, then discontinue Xarelto. Once the patient has completed the Xarelto, they may resume the 81 mg Aspirin. 04/27/12   Jermeka Schlotterbeck, PA  sodium phosphate (FLEET) 7-19 GM/118ML ENEM Place 1 enema rectally daily as needed. 04/27/12   Ralphine Hinks Julien Girt, PA  traMADol (ULTRAM) 50 MG tablet Take 1-2 tablets (50-100 mg total) by mouth every 6 (six) hours as  needed (mild pain). 04/27/12   Donell Tomkins Julien Girt, PA    Diet: Cardiac diet Activity:WBAT Follow-up:in 2 weeks Disposition - Skilled nursing facility - [facility] Discharged Condition: good   Discharge Orders    Future Orders Please Complete By Expires   Diet - low sodium heart healthy      Call MD / Call 911      Comments:   If you experience chest pain or shortness of breath, CALL 911 and be transported to the hospital emergency room.  If you develope a fever above 101 F, pus (white drainage) or increased drainage or redness at the wound, or calf pain, call your surgeon's office.   Discharge instructions      Comments:   Pick up stool softner and laxative for home. Do not submerge incision under water. May shower. Continue to use ice for pain and swelling from surgery.  Take Xarelto for two and a half more weeks, then discontinue Xarelto. Once the patient has completed  the Xarelto, they may resume the 81 mg Aspirin.  When discharged from the skilled rehab facility, please have the facility set up the patient's Home Health Physical Therapy prior to being released.  Also provide the patient with their medications at time of release from the facility to include their pain medication, the muscle relaxants, and their blood thinner medication.  If the patient is still at the rehab facility at time of follow up appointment, please also assist the patient in arranging follow up appointment in our office and any transportation needs.   Constipation Prevention      Comments:   Drink plenty of fluids.  Prune juice may be helpful.  You may use a stool softener, such as Colace (over the counter) 100 mg twice a day.  Use MiraLax (over the counter) for constipation as needed.   Increase activity slowly as tolerated      Patient may shower      Comments:   You may shower without a dressing once there is no drainage.  Do not wash over the wound.  If drainage remains, do not shower until drainage  stops.   Driving restrictions      Comments:   No driving until released by the physician.   Lifting restrictions      Comments:   No lifting until released by the physician.   TED hose      Comments:   Use stockings (TED hose) for 3 weeks on both leg(s).  You may remove them at night for sleeping.   Change dressing      Comments:   Change dressing daily with sterile 4 x 4 inch gauze dressing and apply TED hose. Do not submerge the incision under water.   Do not put a pillow under the knee. Place it under the heel.      Do not sit on low chairs, stoools or toilet seats, as it may be difficult to get up from low surfaces          Medication List     As of 04/27/2012  8:46 AM    STOP taking these medications         aspirin 81 MG tablet      CALCIUM 600 600 MG Tabs   Generic drug: calcium carbonate      cholecalciferol 1000 UNITS tablet   Commonly known as: VITAMIN D      fish oil-omega-3 fatty acids 1000 MG capsule      multivitamin with minerals Tabs      raloxifene 60 MG tablet   Commonly known as: EVISTA      vitamin B-12 1000 MCG tablet   Commonly known as: CYANOCOBALAMIN      TAKE these medications         acetaminophen 325 MG tablet   Commonly known as: TYLENOL   Take 1-2 tablets (325-650 mg total) by mouth every 6 (six) hours as needed.      ALPRAZolam 0.25 MG tablet   Commonly known as: XANAX   Take 0.25 mg by mouth at bedtime as needed.      bisacodyl 10 MG suppository   Commonly known as: DULCOLAX   Place 1 suppository (10 mg total) rectally daily as needed.      cloNIDine 0.1 MG tablet   Commonly known as: CATAPRES   Take 0.1 mg by mouth at bedtime.      DSS 100 MG Caps   Take 100 mg by mouth 2 (  two) times daily.      iron polysaccharides 150 MG capsule   Commonly known as: NIFEREX   Take 1 capsule (150 mg total) by mouth 2 (two) times daily.      levothyroxine 25 MCG tablet   Commonly known as: SYNTHROID, LEVOTHROID   Take 25 mcg by  mouth daily with breakfast. with full glass of water      methocarbamol 500 MG tablet   Commonly known as: ROBAXIN   Take 1 tablet (500 mg total) by mouth every 6 (six) hours as needed.      olmesartan-hydrochlorothiazide 20-12.5 MG per tablet   Commonly known as: BENICAR HCT   Take 1 tablet by mouth daily after breakfast.      omeprazole 20 MG capsule   Commonly known as: PRILOSEC   Take 20 mg by mouth daily before breakfast.      ondansetron 4 MG tablet   Commonly known as: ZOFRAN   Take 1 tablet (4 mg total) by mouth every 6 (six) hours as needed for nausea.      oxyCODONE 5 MG immediate release tablet   Commonly known as: Oxy IR/ROXICODONE   Take 1-2 tablets (5-10 mg total) by mouth every 4 (four) hours as needed for pain.      polyethylene glycol packet   Commonly known as: MIRALAX / GLYCOLAX   Take 17 g by mouth daily as needed.      rivaroxaban 10 MG Tabs tablet   Commonly known as: XARELTO   Take 1 tablet (10 mg total) by mouth daily with breakfast. Take Xarelto for two and a half more weeks, then discontinue Xarelto.  Once the patient has completed the Xarelto, they may resume the 81 mg Aspirin.      sodium phosphate 7-19 GM/118ML Enem   Place 1 enema rectally daily as needed.      traMADol 50 MG tablet   Commonly known as: ULTRAM   Take 1-2 tablets (50-100 mg total) by mouth every 6 (six) hours as needed (mild pain).      TYLENOL PM EXTRA STRENGTH 25-500 MG Tabs   Generic drug: diphenhydramine-acetaminophen   Take 2 tablets by mouth at bedtime as needed. For sleep           Follow-up Information    Follow up with Loanne Drilling, MD. Schedule an appointment as soon as possible for a visit in 2 weeks.   Contact information:   [facility] [address], [address] [place] [address] [phone]          Signed: Patrica Duel 04/27/2012, 8:46 AM

## 2012-04-27 NOTE — Progress Notes (Signed)
   Subjective: 3 Days Post-Op Procedure(s) (LRB): TOTAL KNEE ARTHROPLASTY (Right) Patient reports pain as mild.   Patient seen in rounds with Dr. Lequita Halt. Doing a little better each day.  Progressing slowly with therapy and will need stay at SNF. Patient is well, and has had no acute complaints or problems Patient is ready to go to SNF today.  Objective: Vital signs in last 24 hours: Temp:  [98 F (36.7 C)-98.4 F (36.9 C)] 98.4 F (36.9 C) (10/03 0557) Pulse Rate:  [68-76] 76  (10/03 0557) Resp:  [16] 16  (10/03 0747) BP: (123-147)/(64-70) 123/68 mmHg (10/03 0557) SpO2:  [95 %-98 %] 96 % (10/03 0747)  Intake/Output from previous day:  Intake/Output Summary (Last 24 hours) at 04/27/12 0837 Last data filed at 04/27/12 0556  Gross per 24 hour  Intake    520 ml  Output   2050 ml  Net  -1530 ml    Intake/Output this shift:    Labs:  Basename 04/27/12 0400 04/26/12 0405 04/25/12 0815 04/25/12 0348 04/24/12 1117  HGB 8.0* 9.9* 7.6* 7.2* 13.3    Basename 04/27/12 0400 04/26/12 0405  WBC 9.1 10.8*  RBC 2.44* 2.97*  HCT 22.3* 27.5*  PLT 105* 120*    Basename 04/26/12 0405 04/25/12 0348  NA 121* 124*  K 3.4* 4.2  CL 86* 92*  CO2 27 23  BUN 4* 6  CREATININE 0.56 0.52  GLUCOSE 120* 138*  CALCIUM 8.5 8.2*   No results found for this basename: LABPT:2,INR:2 in the last 72 hours  EXAM: General - Patient is Alert, Appropriate and Oriented Extremity - Neurovascular intact Sensation intact distally Dorsiflexion/Plantar flexion intact No cellulitis present Incision - clean, dry, no drainage, healing Motor Function - intact, moving foot and toes well on exam.   Assessment/Plan: 3 Days Post-Op Procedure(s) (LRB): TOTAL KNEE ARTHROPLASTY (Right) Procedure(s) (LRB): TOTAL KNEE ARTHROPLASTY (Right) Past Medical History  Diagnosis Date  . DDD (degenerative disc disease)   . Osteopenia   . GERD (gastroesophageal reflux disease)   . Hypertension   . Hyperlipidemia     . Vertigo, benign positional   . Hypothyroidism   . Anxiety   . H/O hiatal hernia   . Anemia   . Eczema   . Septicemia 2002    following UTI   Principal Problem:  *OA (osteoarthritis) of knee Active Problems:  Hyponatremia  Postop Acute blood loss anemia  Postop Transfusion  Postop Hypokalemia   Discharge to SNF Diet - Cardiac diet Follow up - in 2 weeks Activity - WBAT Disposition - Skilled nursing facility Condition Upon Discharge - Improving D/C Meds - See DC Summary DVT Prophylaxis - Xarelto  PERKINS, ALEXZANDREW 04/27/2012, 8:37 AM

## 2012-04-27 NOTE — Progress Notes (Signed)
Clinical Social Work Department CLINICAL SOCIAL WORK PLACEMENT NOTE 04/27/2012  Patient:  [name] [name], [name] [name]  Account Number:  [id number] Admit date:  04/24/2012  Clinical Social Worker:  Cori Razor, LCSW  Date/time:  04/25/2012 03:10 PM  Clinical Social Work is seeking post-discharge placement for this patient at the following level of care:   SKILLED NURSING   (*CSW will update this form in Epic as items are completed)     Patient/family provided with [facility] Department of Clinical Social Work's list of facilities offering this level of care within the geographic area requested by the patient (or if unable, by the patient's family).    Patient/family informed of their freedom to choose among providers that offer the needed level of care, that participate in Medicare, Medicaid or managed care program needed by the patient, have an available bed and are willing to accept the patient.    Patient/family informed of [facility]' ownership interest in [facility], as well as of the fact that they are under no obligation to receive care at this facility.  PASARR submitted to [employer] on 04/25/2012 PASARR number received from [employer] on   FL2 transmitted to all facilities in geographic area requested by pt/family on  04/25/2012 FL2 transmitted to all facilities within larger geographic area on   Patient informed that his/her managed care company has contracts with or will negotiate with  certain facilities, including the following:     Patient/family informed of bed offers received:  04/25/2012 Patient chooses bed at [facility] Physician recommends and patient chooses bed at    Patient to be transferred to [facility] on  04/27/2012 Patient to be transferred to facility by [facility]  The following physician request were entered in Epic:   Additional Comments:  Cori Razor LCSW [phone]

## 2012-04-27 NOTE — Progress Notes (Signed)
Report called to Mirian Capuchin, RN at [facility].

## 2012-05-03 ENCOUNTER — Inpatient Hospital Stay (HOSPITAL_COMMUNITY)
Admission: EM | Admit: 2012-05-03 | Discharge: 2012-05-08 | DRG: 392 | Disposition: A | Discharge status: Skilled Nursing Facility | Payer: Medicare Other | Attending: Internal Medicine | Admitting: Internal Medicine

## 2012-05-03 ENCOUNTER — Inpatient Hospital Stay (HOSPITAL_COMMUNITY): Payer: Medicare Other

## 2012-05-03 ENCOUNTER — Encounter (HOSPITAL_COMMUNITY): Payer: Self-pay | Admitting: *Deleted

## 2012-05-03 DIAGNOSIS — R5381 Other malaise: Secondary | ICD-10-CM | POA: Diagnosis present

## 2012-05-03 DIAGNOSIS — K209 Esophagitis, unspecified without bleeding: Secondary | ICD-10-CM

## 2012-05-03 DIAGNOSIS — Z7901 Long term (current) use of anticoagulants: Secondary | ICD-10-CM

## 2012-05-03 DIAGNOSIS — Z9289 Personal history of other medical treatment: Secondary | ICD-10-CM

## 2012-05-03 DIAGNOSIS — K219 Gastro-esophageal reflux disease without esophagitis: Secondary | ICD-10-CM | POA: Diagnosis present

## 2012-05-03 DIAGNOSIS — Z96659 Presence of unspecified artificial knee joint: Secondary | ICD-10-CM

## 2012-05-03 DIAGNOSIS — E876 Hypokalemia: Secondary | ICD-10-CM | POA: Diagnosis present

## 2012-05-03 DIAGNOSIS — R197 Diarrhea, unspecified: Secondary | ICD-10-CM | POA: Diagnosis not present

## 2012-05-03 DIAGNOSIS — Y9210 Unspecified residential institution as the place of occurrence of the external cause: Secondary | ICD-10-CM | POA: Diagnosis present

## 2012-05-03 DIAGNOSIS — D62 Acute posthemorrhagic anemia: Secondary | ICD-10-CM

## 2012-05-03 DIAGNOSIS — K208 Other esophagitis without bleeding: Principal | ICD-10-CM | POA: Diagnosis present

## 2012-05-03 DIAGNOSIS — K2960 Other gastritis without bleeding: Secondary | ICD-10-CM | POA: Diagnosis present

## 2012-05-03 DIAGNOSIS — M899 Disorder of bone, unspecified: Secondary | ICD-10-CM | POA: Diagnosis present

## 2012-05-03 DIAGNOSIS — K922 Gastrointestinal hemorrhage, unspecified: Secondary | ICD-10-CM

## 2012-05-03 DIAGNOSIS — F411 Generalized anxiety disorder: Secondary | ICD-10-CM | POA: Diagnosis present

## 2012-05-03 DIAGNOSIS — E785 Hyperlipidemia, unspecified: Secondary | ICD-10-CM | POA: Diagnosis present

## 2012-05-03 DIAGNOSIS — M179 Osteoarthritis of knee, unspecified: Secondary | ICD-10-CM

## 2012-05-03 DIAGNOSIS — Z79899 Other long term (current) drug therapy: Secondary | ICD-10-CM

## 2012-05-03 DIAGNOSIS — D638 Anemia in other chronic diseases classified elsewhere: Secondary | ICD-10-CM | POA: Diagnosis present

## 2012-05-03 DIAGNOSIS — T502X5A Adverse effect of carbonic-anhydrase inhibitors, benzothiadiazides and other diuretics, initial encounter: Secondary | ICD-10-CM | POA: Diagnosis present

## 2012-05-03 DIAGNOSIS — Z87891 Personal history of nicotine dependence: Secondary | ICD-10-CM

## 2012-05-03 DIAGNOSIS — I10 Essential (primary) hypertension: Secondary | ICD-10-CM | POA: Diagnosis present

## 2012-05-03 DIAGNOSIS — Z9089 Acquired absence of other organs: Secondary | ICD-10-CM

## 2012-05-03 DIAGNOSIS — M949 Disorder of cartilage, unspecified: Secondary | ICD-10-CM | POA: Diagnosis present

## 2012-05-03 DIAGNOSIS — D649 Anemia, unspecified: Secondary | ICD-10-CM

## 2012-05-03 DIAGNOSIS — IMO0002 Reserved for concepts with insufficient information to code with codable children: Secondary | ICD-10-CM | POA: Diagnosis present

## 2012-05-03 DIAGNOSIS — K449 Diaphragmatic hernia without obstruction or gangrene: Secondary | ICD-10-CM | POA: Diagnosis present

## 2012-05-03 DIAGNOSIS — M1710 Unilateral primary osteoarthritis, unspecified knee: Secondary | ICD-10-CM

## 2012-05-03 DIAGNOSIS — E039 Hypothyroidism, unspecified: Secondary | ICD-10-CM | POA: Diagnosis present

## 2012-05-03 DIAGNOSIS — L259 Unspecified contact dermatitis, unspecified cause: Secondary | ICD-10-CM | POA: Diagnosis present

## 2012-05-03 DIAGNOSIS — E236 Other disorders of pituitary gland: Secondary | ICD-10-CM | POA: Diagnosis present

## 2012-05-03 DIAGNOSIS — Z7982 Long term (current) use of aspirin: Secondary | ICD-10-CM

## 2012-05-03 DIAGNOSIS — E871 Hypo-osmolality and hyponatremia: Secondary | ICD-10-CM

## 2012-05-03 LAB — BASIC METABOLIC PANEL
GFR calc Af Amer: >90 mL/min (ref >90)
GFR calc non Af Amer: >90 mL/min (ref >90)
Potassium: 3.6 mEq/L (ref 3.5–5.1)
Sodium: 121 mEq/L — ABNORMAL LOW (ref 135–145)

## 2012-05-03 LAB — FERRITIN: Ferritin: 746 ng/mL — ABNORMAL HIGH (ref 10–291)

## 2012-05-03 LAB — CBC
MCHC: 35.5 g/dL (ref 30.0–36.0)
Platelets: 355 10*3/uL (ref 150–400)
RDW: 17.7 % — ABNORMAL HIGH (ref 11.5–15.5)

## 2012-05-03 LAB — FOLATE: Folate: 17.3 ng/mL

## 2012-05-03 LAB — PROTIME-INR: INR: 0.94 (ref 0.00–1.49)

## 2012-05-03 LAB — RETICULOCYTES: Retic Count, Absolute: 138 10*3/uL (ref 19.0–186.0)

## 2012-05-03 LAB — IRON AND TIBC: Iron: 48 ug/dL (ref 42–135)

## 2012-05-03 LAB — OCCULT BLOOD, POC DEVICE: Fecal Occult Bld: POSITIVE

## 2012-05-03 MED ORDER — BISACODYL 10 MG RE SUPP: 10.0000 mg | Freq: Every day | RECTAL | Status: DC | PRN

## 2012-05-03 MED ORDER — CLONIDINE HCL 0.1 MG PO TABS
0.1000 mg | ORAL_TABLET | Freq: Every day | ORAL | Status: DC
  Administered 2012-05-03 – 2012-05-07 (×5): 0.1 mg via ORAL
  Filled 2012-05-03 (×6): qty 1

## 2012-05-03 MED ORDER — TRAMADOL HCL 50 MG PO TABS
50.0000 mg | ORAL_TABLET | Freq: Four times a day (QID) | ORAL | Status: DC | PRN
Start: 2012-05-03 — End: 2012-05-08
  Administered 2012-05-04 – 2012-05-08 (×7): 50 mg via ORAL
  Filled 2012-05-03 (×8): qty 1

## 2012-05-03 MED ORDER — ALPRAZOLAM 0.25 MG PO TABS
0.2500 mg | ORAL_TABLET | Freq: Every evening | ORAL | Status: DC | PRN
  Administered 2012-05-03 – 2012-05-07 (×5): 0.25 mg via ORAL
  Filled 2012-05-03 (×5): qty 1

## 2012-05-03 MED ORDER — MORPHINE SULFATE 4 MG/ML IJ SOLN
4.0000 mg | Freq: Once | INTRAMUSCULAR | Status: AC
  Administered 2012-05-03: 4 mg via INTRAVENOUS
  Filled 2012-05-03: qty 1

## 2012-05-03 MED ORDER — OXYCODONE HCL 5 MG PO TABS
5.0000 mg | ORAL_TABLET | ORAL | Status: DC | PRN
  Filled 2012-05-03: qty 2
  Filled 2012-05-03 (×2): qty 1

## 2012-05-03 MED ORDER — SODIUM CHLORIDE 0.9 % IV SOLN: INTRAVENOUS | Status: AC

## 2012-05-03 MED ORDER — IRBESARTAN 150 MG PO TABS
150.0000 mg | ORAL_TABLET | Freq: Every day | ORAL | Status: DC
  Administered 2012-05-04 – 2012-05-05 (×2): 150 mg via ORAL
  Filled 2012-05-03 (×2): qty 1

## 2012-05-03 MED ORDER — MORPHINE SULFATE 2 MG/ML IJ SOLN: 1.0000 mg | INTRAMUSCULAR | Status: DC | PRN

## 2012-05-03 MED ORDER — LEVOTHYROXINE SODIUM 25 MCG PO TABS
25.0000 ug | ORAL_TABLET | Freq: Every day | ORAL | Status: DC
  Administered 2012-05-04 – 2012-05-08 (×5): 25 ug via ORAL
  Filled 2012-05-03 (×7): qty 1

## 2012-05-03 MED ORDER — DOCUSATE SODIUM 100 MG PO CAPS
100.0000 mg | ORAL_CAPSULE | Freq: Two times a day (BID) | ORAL | Status: DC
  Administered 2012-05-03 – 2012-05-05 (×4): 100 mg via ORAL
  Filled 2012-05-03 (×5): qty 1

## 2012-05-03 MED ORDER — ACETAMINOPHEN 325 MG PO TABS
325.0000 mg | ORAL_TABLET | Freq: Four times a day (QID) | ORAL | Status: DC | PRN
  Administered 2012-05-04 – 2012-05-05 (×2): 325 mg via ORAL
  Administered 2012-05-05 – 2012-05-07 (×4): 650 mg via ORAL
  Administered 2012-05-07: 325 mg via ORAL
  Administered 2012-05-08: 650 mg via ORAL
  Filled 2012-05-03: qty 2
  Filled 2012-05-03: qty 1
  Filled 2012-05-03 (×2): qty 2
  Filled 2012-05-03: qty 1
  Filled 2012-05-03 (×3): qty 2

## 2012-05-03 MED ORDER — PANTOPRAZOLE SODIUM 40 MG IV SOLR
40.0000 mg | Freq: Two times a day (BID) | INTRAVENOUS | Status: DC
  Administered 2012-05-04 – 2012-05-08 (×9): 40 mg via INTRAVENOUS
  Filled 2012-05-03 (×11): qty 40

## 2012-05-03 NOTE — ED Provider Notes (Signed)
History     CSN: [id number]  Arrival date & time 05/03/12  1236   First MD Initiated Contact with Patient 05/03/12 1248      Chief Complaint  Patient presents with  . Abnormal Lab  . Knee Pain     The history is provided by the patient.   the patient is 9 days out from a total right knee.  She was placed on several postoperatively.  She received a blood transfusion the day after surgery of 2 units of blood for hemoglobin is 7.6.  His hemoglobin rose to 9.9 appropriately.  She was sent to the emergency department today because of a hemoglobin noted to be 5.8 yesterday at her rehab facility.  She reports a prior history of gastritis on endoscopy 10 years ago.  She denies hematemesis.  She reports she's had melena for the past week.  She was taken off of her xarelto yesterday when her hemoglobin was noted to be 5.8.  Today her only complaint is generalized weakness and right knee pain.   Past Medical History  Diagnosis Date  . DDD (degenerative disc disease)   . Osteopenia   . GERD (gastroesophageal reflux disease)   . Hypertension   . Hyperlipidemia   . Vertigo, benign positional   . Hypothyroidism   . Anxiety   . H/O hiatal hernia   . Anemia   . Eczema   . Septicemia 2002    following UTI    Past Surgical History  Procedure Date  . Dilation and curettage of uterus     x 2  . Breast surgery     left biopsy  . Knee arthroscopy 2011    right  . Tonsillectomy   . Eye surgery     cataract extraction with ILO  ? eye  . Total knee arthroplasty 04/24/2012    Procedure: TOTAL KNEE ARTHROPLASTY;  Surgeon: Loanne Drilling, MD;  Location: [facility];  Service: Orthopedics;  Laterality: Right;    History reviewed. No pertinent family history.  History  Substance Use Topics  . Smoking status: Former Smoker -- 0 years  . Smokeless tobacco: Not on file  . Alcohol Use: Yes     occ glass wine    OB History    Grav Para Term Preterm Abortions TAB SAB Ect Mult Living                    Review of Systems  All other systems reviewed and are negative.    Allergies  Review of patient's allergies indicates no known allergies.  Home Medications   Current Outpatient Rx  Name Route Sig Dispense Refill  . ACETAMINOPHEN 325 MG PO TABS Oral Take 325-650 mg by mouth every 6 (six) hours as needed. pain    . ALPRAZOLAM 0.25 MG PO TABS Oral Take 0.25 mg by mouth at bedtime as needed. Anxiety/sleep    . BISACODYL 10 MG RE SUPP Rectal Place 10 mg rectally daily as needed. diarrhea    . CLONIDINE HCL 0.1 MG PO TABS Oral Take 0.1 mg by mouth at bedtime.    . DSS 100 MG PO CAPS Oral Take 100 mg by mouth 2 (two) times daily. 60 capsule 0  . FERROUS SULFATE 325 (65 FE) MG PO TABS Oral Take 325 mg by mouth daily with breakfast.    . LEVOTHYROXINE SODIUM 25 MCG PO TABS Oral Take 25 mcg by mouth daily with breakfast. with full glass of water    .  METHOCARBAMOL 500 MG PO TABS Oral Take 500 mg by mouth every 6 (six) hours as needed. Muscle spasms    . OLMESARTAN MEDOXOMIL-HCTZ 20-12.5 MG PO TABS Oral Take 1 tablet by mouth daily after breakfast.     . OMEPRAZOLE 20 MG PO CPDR Oral Take 20 mg by mouth daily before breakfast.    . ONDANSETRON HCL 4 MG PO TABS Oral Take 1 tablet (4 mg total) by mouth every 6 (six) hours as needed for nausea. 40 tablet 0  . OXYCODONE HCL 5 MG PO TABS Oral Take 1-2 tablets (5-10 mg total) by mouth every 4 (four) hours as needed for pain. 90 tablet 0  . RIVAROXABAN 10 MG PO TABS Oral Take 10 mg by mouth daily with breakfast. Take Xarelto for two and a half more weeks, then discontinue Xarelto. Once the patient has completed the Xarelto, they may resume the 81 mg Aspirin.    Marland Kitchen TRAMADOL HCL 50 MG PO TABS Oral Take 1-2 tablets (50-100 mg total) by mouth every 6 (six) hours as needed (mild pain). 60 tablet 0    BP 149/87  Pulse 80  Temp 97.9 F (36.6 C) (Oral)  Resp 11  SpO2 97%  Physical Exam  Nursing note and vitals reviewed. Constitutional: She is  oriented to person, place, and time. She appears well-developed and well-nourished. No distress.  HENT:  Head: Normocephalic and atraumatic.  Eyes: EOM are normal.  Neck: Normal range of motion.  Cardiovascular: Normal rate, regular rhythm and normal heart sounds.   Pulmonary/Chest: Effort normal and breath sounds normal.  Abdominal: Soft. She exhibits no distension. There is no tenderness.  Genitourinary:       Melena  Musculoskeletal:       Healing wound over right knee.  Normal pulses in her right foot. Ecchymosis surrounding her right knee  Neurological: She is alert and oriented to person, place, and time.  Skin: Skin is warm and dry.  Psychiatric: She has a normal mood and affect. Judgment normal.    ED Course  Procedures (including critical care time)  Labs Reviewed  CBC - Abnormal; Notable for the following:    RBC 2.00 (*)     Hemoglobin 7.0 (*)     HCT 19.7 (*)     MCH 35.0 (*)     RDW 17.7 (*)     All other components within normal limits  BASIC METABOLIC PANEL - Abnormal; Notable for the following:    Sodium 121 (*)     Chloride 84 (*)     Glucose, Bld 120 (*)     Creatinine, Ser 0.49 (*)     All other components within normal limits  RETICULOCYTES - Abnormal; Notable for the following:    Retic Ct Pct 6.9 (*)     RBC. 2.00 (*)     All other components within normal limits  TYPE AND SCREEN  PROTIME-INR  VITAMIN B12  FOLATE  IRON AND TIBC  FERRITIN  PREPARE RBC (CROSSMATCH)   No results found.   Date: 05/03/2012  Rate: 78  Rhythm: normal sinus rhythm  QRS Axis: normal  Intervals: normal  ST/T Wave abnormalities: normal  Conduction Disutrbances: none  Narrative Interpretation:   Old EKG Reviewed: No significant changes noted     1. GI bleed   2. Anemia       MDM  Melena on exam. Taken of xarelto yesterday. Weakness and symptomatic anemia. Will admit and transfuse      4:00  PM [facility] to admit. Awaiting on [facility] gi call back. Admit to  telemetry. Transfusion now  Lyanne Co, MD 05/03/12 1600

## 2012-05-03 NOTE — H&P (Addendum)
PCP:   Minda Meo, MD   Chief Complaint:  Black tarry stools  HPI: 78 y/o female who is s/p total knee replacement 9 days ago, was brought to the hospital after she was found to have low hemoglobin of 5.8 yesterday at the nursing home. Patient received two units PRBC after surgery and the Hb had raised to 9.9.Today in The ER she was found to have hb of 7.0, with black colored stools and heme positive stools. Patient complains of generalized weakness, no chest pain, no shortness of breath, no nausea, vomiting or diarrhea.   Allergies:  No Known Allergies    Past Medical History  Diagnosis Date  . DDD (degenerative disc disease)   . Osteopenia   . GERD (gastroesophageal reflux disease)   . Hypertension   . Hyperlipidemia   . Vertigo, benign positional   . Hypothyroidism   . Anxiety   . H/O hiatal hernia   . Anemia   . Eczema   . Septicemia 2002    following UTI    Past Surgical History  Procedure Date  . Dilation and curettage of uterus     x 2  . Breast surgery     left biopsy  . Knee arthroscopy 2011    right  . Tonsillectomy   . Eye surgery     cataract extraction with ILO  ? eye  . Total knee arthroplasty 04/24/2012    Procedure: TOTAL KNEE ARTHROPLASTY;  Surgeon: Loanne Drilling, MD;  Location: [facility];  Service: Orthopedics;  Laterality: Right;    Prior to Admission medications   Medication Sig Start Date End Date Taking? Authorizing Provider  acetaminophen (TYLENOL) 325 MG tablet Take 325-650 mg by mouth every 6 (six) hours as needed. pain 04/27/12  Yes Alexzandrew Julien Girt, PA  ALPRAZolam (XANAX) 0.25 MG tablet Take 0.25 mg by mouth at bedtime as needed. Anxiety/sleep   Yes Historical Provider, MD  bisacodyl (DULCOLAX) 10 MG suppository Place 10 mg rectally daily as needed. diarrhea 04/27/12  Yes Alexzandrew Julien Girt, PA  cloNIDine (CATAPRES) 0.1 MG tablet Take 0.1 mg by mouth at bedtime.   Yes Historical Provider, MD  docusate sodium 100 MG CAPS Take 100 mg  by mouth 2 (two) times daily. 04/27/12  Yes Alexzandrew Julien Girt, PA  ferrous sulfate 325 (65 FE) MG tablet Take 325 mg by mouth daily with breakfast.   Yes Historical Provider, MD  levothyroxine (SYNTHROID, LEVOTHROID) 25 MCG tablet Take 25 mcg by mouth daily with breakfast. with full glass of water   Yes Historical Provider, MD  methocarbamol (ROBAXIN) 500 MG tablet Take 500 mg by mouth every 6 (six) hours as needed. Muscle spasms 04/27/12  Yes Alexzandrew Julien Girt, PA  olmesartan-hydrochlorothiazide (BENICAR HCT) 20-12.5 MG per tablet Take 1 tablet by mouth daily after breakfast.    Yes Historical Provider, MD  omeprazole (PRILOSEC) 20 MG capsule Take 20 mg by mouth daily before breakfast.   Yes Historical Provider, MD  ondansetron (ZOFRAN) 4 MG tablet Take 1 tablet (4 mg total) by mouth every 6 (six) hours as needed for nausea. 04/27/12  Yes Alexzandrew Julien Girt, PA  oxyCODONE (OXY IR/ROXICODONE) 5 MG immediate release tablet Take 1-2 tablets (5-10 mg total) by mouth every 4 (four) hours as needed for pain. 04/27/12  Yes Alexzandrew Julien Girt, PA  rivaroxaban (XARELTO) 10 MG TABS tablet Take 10 mg by mouth daily with breakfast. Take Xarelto for two and a half more weeks, then discontinue Xarelto. Once the patient has completed the Xarelto,  they may resume the 81 mg Aspirin. 04/27/12 05/15/12 Yes Alexzandrew Julien Girt, PA  traMADol (ULTRAM) 50 MG tablet Take 1-2 tablets (50-100 mg total) by mouth every 6 (six) hours as needed (mild pain). 04/27/12  Yes Alexzandrew Julien Girt, PA    Social History:  reports that she quit smoking about 47 years ago. She has never used smokeless tobacco. She reports that she drinks alcohol. She reports that she does not use illicit drugs.  History reviewed. No pertinent family history.  Review of Systems:  HEENT: Denies headache, blurred vision, runny nose, sore throat,  Neck Positive  thyroid problems, no lymphadenopathy Chest : Denies shortness of breath, no history of  COPD Heart : Denies Chest pain,  coronary arterey disease GI: Denies  nausea, vomiting, diarrhea, constipation GU: Denies dysuria, urgency, frequency of urination, hematuria Neuro: Denies stroke, seizures, syncope Psych: Denies depression, anxiety, hallucinations   Physical Exam: Blood pressure 149/87, pulse 80, temperature 97.9 F (36.6 C), temperature source Oral, resp. rate 11, SpO2 97.00%. Constitutional:   Patient is a well-developed and well-nourished female  in no acute distress and cooperative with exam. Head: Normocephalic and atraumatic Mouth: Mucus membranes moist Eyes: PERRL, EOMI, conjunctivae normal Neck: Supple, No Thyromegaly Cardiovascular: RRR, positive grade 11/V1systolic murmur at the aortic area Pulmonary/Chest: CTAB, no wheezes, rales, or rhonchi Abdominal: Soft. Non-tender, non-distended, bowel sounds are normal, no masses, organomegaly, or guarding present.  Neurological: A&O x3, Strenght is normal and symmetric bilaterally, cranial nerve II-XII are grossly intact, no focal motor deficit, sensory intact to light touch bilaterally.  Extremities : No Cyanosis, Clubbing or Edema   Labs on Admission:  Results for orders placed during the hospital encounter of 05/03/12 (from the past 48 hour(s))  TYPE AND SCREEN     Status: Normal   Collection Time   05/03/12  1:11 PM      Component Value Range Comment   ABO/RH(D) O POS      Antibody Screen POS      Sample Expiration 05/06/2012      DAT, IgG NEG     CBC     Status: Abnormal   Collection Time   05/03/12  1:38 PM      Component Value Range Comment   WBC 6.9  4.0 - 10.5 K/uL    RBC 2.00 (*) 3.87 - 5.11 MIL/uL    Hemoglobin 7.0 (*) 12.0 - 15.0 g/dL    HCT 16.1 (*) 09.6 - 46.0 %    MCV 98.5  78.0 - 100.0 fL    MCH 35.0 (*) 26.0 - 34.0 pg    MCHC 35.5  30.0 - 36.0 g/dL    RDW 04.5 (*) 40.9 - 15.5 %    Platelets 355  150 - 400 K/uL   BASIC METABOLIC PANEL     Status: Abnormal   Collection Time   05/03/12  1:38  PM      Component Value Range Comment   Sodium 121 (*) 135 - 145 mEq/L    Potassium 3.6  3.5 - 5.1 mEq/L    Chloride 84 (*) 96 - 112 mEq/L    CO2 27  19 - 32 mEq/L    Glucose, Bld 120 (*) 70 - 99 mg/dL    BUN 12  6 - 23 mg/dL    Creatinine, Ser 8.11 (*) 0.50 - 1.10 mg/dL    Calcium 8.8  8.4 - 91.4 mg/dL    GFR calc non Af Amer >90  >90 mL/min    GFR  calc Af Amer >90  >90 mL/min   RETICULOCYTES     Status: Abnormal   Collection Time   05/03/12  1:38 PM      Component Value Range Comment   Retic Ct Pct 6.9 (*) 0.4 - 3.1 %    RBC. 2.00 (*) 3.87 - 5.11 MIL/uL    Retic Count, Manual 138.0  19.0 - 186.0 K/uL   PROTIME-INR     Status: Normal   Collection Time   05/03/12  1:38 PM      Component Value Range Comment   Prothrombin Time 12.5  11.6 - 15.2 seconds    INR 0.94  0.00 - 1.49   OCCULT BLOOD, POC DEVICE     Status: Normal   Collection Time   05/03/12  3:28 PM      Component Value Range Comment   Fecal Occult Bld POSITIVE       Radiological Exams on Admission: No results found.  Assessment/Plan Active Problems:  * No active hospital problems. *   GI Bleed Patient says that she has been taking enteric coated aspirin 81 mg po daily for many years. She was put on xarelto for the DVT prophylaxis after the surgery. We will continue to hold the Xarelto, and get GI consult. [facility] to see the patient today.  Anemia Secondary to above ER physician has ordered 2 units PRBC. Will check CBC in am.  Hypertension Will hold the HCTZ due to hyponatremia. Continue cloidine 0.1 mh po daily at bedtime. Will also obtain 2D Echo  Hyponatremia ? Sec to HCTZ, hypothyroidism Will check urine sodium, urine and serum osmolality. Order CXR. Start fluid restriction 1200 ml/day.  DVT prophylaxis SCD's.     Time Spent on Admission: 75 min  Branson Kranz S [facility] Pager: [phone] 05/03/2012, 4:49 PM

## 2012-05-03 NOTE — Consult Note (Signed)
Chart was reviewed and patient was examined. X-rays were reviewed.    I agree with management and plans. By history she's had a GI bleed though anemia is multifactorial.  Would not expect spontaneous GI bleeding from xarelto alone.  A GI bleeding source needs to be ruled out  Molly Maduro D. Arlyce Dice, M.D., [facility] Gastroenterology Cell [phone]

## 2012-05-03 NOTE — Consult Note (Signed)
Referring Provider: [facility] hospital[facility] Primary Care Physician:  Minda Meo, MD Primary Gastroenterologist:  Dr. Marina Goodell -remote  Reason for Consultation:  Melena on Xarelto post knee replacement  HPI: [name] [name] is a 78 y.o. female  urgency this afternoon from a rehabilitation facility with complaints of melena over the past one week . Patient had undergone a total right knee replacement on 04/24/2012 per Dr. Lequita Halt and was placed on Xarelto postoperatively .She had a preop hemoglobin of 12 on 04/18/2012 and postoperatively on 04/25/2012 her hemoglobin was down to 7.2 he was transfused 2 units of packed rbc's. On discharge from the hospital on 10 3 her hemoglobin was 8.   apparently hemoglobin was checked at the rehabilitation facility yesterday and was noted to be down to 5.8, this I/O return today and she was sent to the emergency room for remission. Hemoglobin here is 7 . She is hemodynamically stable but relates black stools over the past one week. She is no complaints of abdominal pain nausea vomiting heartburn indigestion or dysphagia. She has not had any previous GI bleed though did have gastritis apparently many years ago on an upper endoscopy. She had been on aspirin preoperatively but was not taking that while on the Xarelto. She had been on oral iron supplements postoperatively as well. Her BUN here is normal today  Past Medical History  Diagnosis Date  . DDD (degenerative disc disease)   . Osteopenia   . GERD (gastroesophageal reflux disease)   . Hypertension   . Hyperlipidemia   . Vertigo, benign positional   . Hypothyroidism   . Anxiety   . H/O hiatal hernia   . Anemia   . Eczema   . Septicemia 2002    following UTI    Past Surgical History  Procedure Date  . Dilation and curettage of uterus     x 2  . Breast surgery     left biopsy  . Knee arthroscopy 2011    right  . Tonsillectomy   . Eye surgery     cataract extraction with ILO  ? eye  . Total knee  arthroplasty 04/24/2012    Procedure: TOTAL KNEE ARTHROPLASTY;  Surgeon: Loanne Drilling, MD;  Location: [facility];  Service: Orthopedics;  Laterality: Right;    Prior to Admission medications   Medication Sig Start Date End Date Taking? Authorizing Provider  acetaminophen (TYLENOL) 325 MG tablet Take 325-650 mg by mouth every 6 (six) hours as needed. pain 04/27/12  Yes Alexzandrew Julien Girt, PA  ALPRAZolam (XANAX) 0.25 MG tablet Take 0.25 mg by mouth at bedtime as needed. Anxiety/sleep   Yes Historical Provider, MD  bisacodyl (DULCOLAX) 10 MG suppository Place 10 mg rectally daily as needed. diarrhea 04/27/12  Yes Alexzandrew Julien Girt, PA  cloNIDine (CATAPRES) 0.1 MG tablet Take 0.1 mg by mouth at bedtime.   Yes Historical Provider, MD  docusate sodium 100 MG CAPS Take 100 mg by mouth 2 (two) times daily. 04/27/12  Yes Alexzandrew Julien Girt, PA  ferrous sulfate 325 (65 FE) MG tablet Take 325 mg by mouth daily with breakfast.   Yes Historical Provider, MD  levothyroxine (SYNTHROID, LEVOTHROID) 25 MCG tablet Take 25 mcg by mouth daily with breakfast. with full glass of water   Yes Historical Provider, MD  methocarbamol (ROBAXIN) 500 MG tablet Take 500 mg by mouth every 6 (six) hours as needed. Muscle spasms 04/27/12  Yes Alexzandrew Julien Girt, PA  olmesartan-hydrochlorothiazide (BENICAR HCT) 20-12.5 MG per tablet Take 1 tablet by mouth daily  after breakfast.    Yes Historical Provider, MD  omeprazole (PRILOSEC) 20 MG capsule Take 20 mg by mouth daily before breakfast.   Yes Historical Provider, MD  ondansetron (ZOFRAN) 4 MG tablet Take 1 tablet (4 mg total) by mouth every 6 (six) hours as needed for nausea. 04/27/12  Yes Alexzandrew Julien Girt, PA  oxyCODONE (OXY IR/ROXICODONE) 5 MG immediate release tablet Take 1-2 tablets (5-10 mg total) by mouth every 4 (four) hours as needed for pain. 04/27/12  Yes Alexzandrew Julien Girt, PA  rivaroxaban (XARELTO) 10 MG TABS tablet Take 10 mg by mouth daily with breakfast. Take  Xarelto for two and a half more weeks, then discontinue Xarelto. Once the patient has completed the Xarelto, they may resume the 81 mg Aspirin. 04/27/12 05/15/12 Yes Alexzandrew Julien Girt, PA  traMADol (ULTRAM) 50 MG tablet Take 1-2 tablets (50-100 mg total) by mouth every 6 (six) hours as needed (mild pain). 04/27/12  Yes Alexzandrew Julien Girt, PA    Current Facility-Administered Medications  Medication Dose Route Frequency Provider Last Rate Last Dose  . morphine 4 MG/ML injection 4 mg  4 mg Intravenous Once Lyanne Co, MD   4 mg at 05/03/12 1447   Current Outpatient Prescriptions  Medication Sig Dispense Refill  . acetaminophen (TYLENOL) 325 MG tablet Take 325-650 mg by mouth every 6 (six) hours as needed. pain      . ALPRAZolam (XANAX) 0.25 MG tablet Take 0.25 mg by mouth at bedtime as needed. Anxiety/sleep      . bisacodyl (DULCOLAX) 10 MG suppository Place 10 mg rectally daily as needed. diarrhea      . cloNIDine (CATAPRES) 0.1 MG tablet Take 0.1 mg by mouth at bedtime.      . docusate sodium 100 MG CAPS Take 100 mg by mouth 2 (two) times daily.  60 capsule  0  . ferrous sulfate 325 (65 FE) MG tablet Take 325 mg by mouth daily with breakfast.      . levothyroxine (SYNTHROID, LEVOTHROID) 25 MCG tablet Take 25 mcg by mouth daily with breakfast. with full glass of water      . methocarbamol (ROBAXIN) 500 MG tablet Take 500 mg by mouth every 6 (six) hours as needed. Muscle spasms      . olmesartan-hydrochlorothiazide (BENICAR HCT) 20-12.5 MG per tablet Take 1 tablet by mouth daily after breakfast.       . omeprazole (PRILOSEC) 20 MG capsule Take 20 mg by mouth daily before breakfast.      . ondansetron (ZOFRAN) 4 MG tablet Take 1 tablet (4 mg total) by mouth every 6 (six) hours as needed for nausea.  40 tablet  0  . oxyCODONE (OXY IR/ROXICODONE) 5 MG immediate release tablet Take 1-2 tablets (5-10 mg total) by mouth every 4 (four) hours as needed for pain.  90 tablet  0  . rivaroxaban  (XARELTO) 10 MG TABS tablet Take 10 mg by mouth daily with breakfast. Take Xarelto for two and a half more weeks, then discontinue Xarelto. Once the patient has completed the Xarelto, they may resume the 81 mg Aspirin.      . traMADol (ULTRAM) 50 MG tablet Take 1-2 tablets (50-100 mg total) by mouth every 6 (six) hours as needed (mild pain).  60 tablet  0  . DISCONTD: rivaroxaban (XARELTO) 10 MG TABS tablet Take 1 tablet (10 mg total) by mouth daily with breakfast. Take Xarelto for two and a half more weeks, then discontinue Xarelto. Once the patient has completed the Xarelto,  they may resume the 81 mg Aspirin.  18 tablet  0    Allergies as of 05/03/2012  . (No Known Allergies)    History reviewed. No pertinent family history.  History   Social History  . Marital Status: Widowed    Spouse Name: N/A    Number of Children: N/A  . Years of Education: N/A   Occupational History  . Not on file.   Social History Main Topics  . Smoking status: Former Smoker -- 0 years    Quit date: 05/03/1965  . Smokeless tobacco: Never Used  . Alcohol Use: Yes     occ glass wine  . Drug Use: No  . Sexually Active: Not on file   Other Topics Concern  . Not on file   Social History Narrative  . No narrative on file    Review of Systems: Pertinent positive and negative review of systems were noted in the above HPI section.  All other review of systems was otherwise negative. Marland Kitchen  Physical Exam: Vital signs in last 24 hours: Temp:  [97.9 F (36.6 C)] 97.9 F (36.6 C) (10/09 1255) Pulse Rate:  [80] 80  (10/09 1255) Resp:  [11] 11  (10/09 1255) BP: (149)/(87) 149/87 mmHg (10/09 1255) SpO2:  [96 %-97 %] 97 % (10/09 1255)   General:   Alert,  Well-developed, elderly white female in no acute distress Bp 156/86 Head:  Normocephalic and atraumatic. Eyes:  Sclera clear, no icterus.   Conjunctiva pale and. Ears:  Normal auditory acuity. Nose:  No deformity, discharge,  or lesions. Mouth:  No  deformity or lesions.   Neck:  Supple; no masses or thyromegaly. Lungs:  Clear throughout to auscultation.   No wheezes, crackles, or rhonchi. Heart:  Regular rate and rhythm; no murmurs, clicks, rubs,  or gallops. Abdomen:  Soft,nontender, BS active,nonpalp mass or hsm.   Rectal:  Deferred -stool documented black and heme positive by ER Msk:  Recent knee replacement  Pulses:  Normal pulses noted. Extremities:  Without clubbing or edema. Neurologic:  Alert and  oriented x4;  grossly normal neurologically. Skin:  Intact without significant lesions or rashes.. Psych:  Alert and cooperative. Normal mood and affect.  Intake/Output from previous day:   Intake/Output this shift:    Lab Results:  Basename 05/03/12 1338  WBC 6.9  HGB 7.0*  HCT 19.7*  PLT 355   BMET  Basename 05/03/12 1338  NA 121*  K 3.6  CL 84*  CO2 27  GLUCOSE 120*  BUN 12  CREATININE 0.49*  CALCIUM 8.8   LFT No results found for this basename: PROT,ALBUMIN,AST,ALT,ALKPHOS,BILITOT,BILIDIR,IBILI in the last 72 hours PT/INR  Basename 05/03/12 1338  LABPROT 12.5  INR 0.94     Studies/Results: No results found.  IMPRESSION:  #78 78 year old female 9 days status post right total knee replacement, discharged on xarelto for DVT prophylaxis, now presenting with progressive anemia and complaint of melena x1 week. Hemoglobin was documented at 8 on the day of discharge from hospital 10 3 and is documented at 7 today. It  is not clear whether her melena may be a combination of low-grade bleeding and dark stool from iron, or whether she has been having a slow GI bleed postoperatively in the setting of anticoagulation.  Plan; transfuse 2 units of packed rbc's, and keep hemoglobin in the 9 range Hold Xarelto, and will need to discuss with orthopedics regarding need for ongoing anticoagulation Start IV Protonix every 12 hours Or liquid diet  this evening and n.p.o. after midnight  schedule for upper endoscopy  Thursday 10/ 10/,013 with Dr. Arlyce Dice  ,Plan was discussed with the patient and her son and they are agreeable.      [name] Esterwood  05/03/2012, 4:47 PM

## 2012-05-03 NOTE — ED Notes (Addendum)
Per EMS pt in from [facility] for rehab of right knee. Pt had knee replacement. Facility sent pt out for low hemoglobin of 5.8. Pt only c/o knee pain.

## 2012-05-04 ENCOUNTER — Encounter (HOSPITAL_COMMUNITY)
Admission: EM | Disposition: A | Discharge status: Skilled Nursing Facility | Payer: Self-pay | Source: Home / Self Care | Attending: Internal Medicine

## 2012-05-04 ENCOUNTER — Other Ambulatory Visit: Payer: Self-pay | Admitting: Gastroenterology

## 2012-05-04 ENCOUNTER — Encounter (HOSPITAL_COMMUNITY): Payer: Self-pay

## 2012-05-04 ENCOUNTER — Encounter (HOSPITAL_COMMUNITY): Payer: Self-pay | Admitting: *Deleted

## 2012-05-04 DIAGNOSIS — E876 Hypokalemia: Secondary | ICD-10-CM

## 2012-05-04 DIAGNOSIS — K209 Esophagitis, unspecified without bleeding: Secondary | ICD-10-CM

## 2012-05-04 HISTORY — PX: ESOPHAGOGASTRODUODENOSCOPY: SHX5428

## 2012-05-04 LAB — COMPREHENSIVE METABOLIC PANEL
ALT: 18 U/L (ref 0–35)
AST: 28 U/L (ref 0–37)
Albumin: 2.4 g/dL — ABNORMAL LOW (ref 3.5–5.2)
Alkaline Phosphatase: 78 U/L (ref 39–117)
CO2: 26 mEq/L (ref 19–32)
Chloride: 90 mEq/L — ABNORMAL LOW (ref 96–112)
Potassium: 3.3 mEq/L — ABNORMAL LOW (ref 3.5–5.1)
Sodium: 125 mEq/L — ABNORMAL LOW (ref 135–145)
Total Bilirubin: 0.8 mg/dL (ref 0.3–1.2)

## 2012-05-04 LAB — CBC
MCH: 33.7 pg (ref 26.0–34.0)
MCHC: 35.6 g/dL (ref 30.0–36.0)
MCV: 94.8 fL (ref 78.0–100.0)
Platelets: 295 10*3/uL (ref 150–400)
RBC: 2.67 MIL/uL — ABNORMAL LOW (ref 3.87–5.11)

## 2012-05-04 LAB — OSMOLALITY: Osmolality: 260 mOsm/kg — ABNORMAL LOW (ref 275–300)

## 2012-05-04 LAB — OSMOLALITY, URINE: Osmolality, Ur: 285 mOsm/kg — ABNORMAL LOW (ref 390–1090)

## 2012-05-04 SURGERY — EGD (ESOPHAGOGASTRODUODENOSCOPY)
Anesthesia: Moderate Sedation

## 2012-05-04 MED ORDER — BUTAMBEN-TETRACAINE-BENZOCAINE 2-2-14 % EX AERO
INHALATION_SPRAY | CUTANEOUS | Status: DC | PRN
  Administered 2012-05-04: 2 via TOPICAL

## 2012-05-04 MED ORDER — MIDAZOLAM HCL 10 MG/2ML IJ SOLN
INTRAMUSCULAR | Status: DC | PRN
  Administered 2012-05-04: 2 mg via INTRAVENOUS
  Administered 2012-05-04: 1 mg via INTRAVENOUS
  Administered 2012-05-04: 2 mg via INTRAVENOUS

## 2012-05-04 MED ORDER — MIDAZOLAM HCL 10 MG/2ML IJ SOLN
INTRAMUSCULAR | Status: AC
  Filled 2012-05-04: qty 2

## 2012-05-04 MED ORDER — ENSURE COMPLETE PO LIQD
237.0000 mL | Freq: Every day | ORAL | Status: DC
  Administered 2012-05-05 – 2012-05-06 (×2): 237 mL via ORAL
  Filled 2012-05-04 (×2): qty 237

## 2012-05-04 MED ORDER — POTASSIUM CHLORIDE CRYS ER 20 MEQ PO TBCR
40.0000 meq | EXTENDED_RELEASE_TABLET | Freq: Once | ORAL | Status: AC
  Administered 2012-05-04: 40 meq via ORAL
  Filled 2012-05-04 (×2): qty 2

## 2012-05-04 MED ORDER — SODIUM CHLORIDE 0.9 % IV SOLN
INTRAVENOUS | Status: DC
  Administered 2012-05-04: 12:00:00 via INTRAVENOUS

## 2012-05-04 MED ORDER — FENTANYL CITRATE 0.05 MG/ML IJ SOLN
INTRAMUSCULAR | Status: DC | PRN
  Administered 2012-05-04 (×2): 25 ug via INTRAVENOUS

## 2012-05-04 MED ORDER — GLYCOPYRROLATE 0.2 MG/ML IJ SOLN
INTRAMUSCULAR | Status: AC
  Filled 2012-05-04: qty 1

## 2012-05-04 MED ORDER — FENTANYL CITRATE 0.05 MG/ML IJ SOLN
INTRAMUSCULAR | Status: AC
  Filled 2012-05-04: qty 2

## 2012-05-04 MED ORDER — GLYCOPYRROLATE 0.2 MG/ML IJ SOLN
INTRAMUSCULAR | Status: DC | PRN
  Administered 2012-05-04: 0.2 mg via INTRAVENOUS

## 2012-05-04 NOTE — Progress Notes (Signed)
  Echocardiogram 2D Echocardiogram has been performed.  Rajni Holsworth 05/04/2012, 3:13 PM

## 2012-05-04 NOTE — Progress Notes (Signed)
Attempted to meet Ax Pt.  Pt currently undergoing a procedure.  LM for Jasmine December at [facility]  CSW to continue to follow.  Providence Crosby, LCSWA Clinical Social Work [phone]

## 2012-05-04 NOTE — Progress Notes (Signed)
Patient ID: [name] [name]  female  YSA:[id number]    DOB: 1930-09-01    DOA: 05/03/2012  PCP: Minda Meo, MD  Subjective: Patient seen earlier today before the endoscopy, complaining of dehydration. Patient had BM yesterday did not have any bleeding. Objective: Weight change:   Intake/Output Summary (Last 24 hours) at 05/04/12 1507 Last data filed at 05/04/12 0814  Gross per 24 hour  Intake    700 ml  Output    850 ml  Net   -150 ml   Blood pressure 162/64, pulse 86, temperature 98.4 F (36.9 C), temperature source Oral, resp. rate 17, height 5\' 7"  (1.702 m), weight 66.225 kg (146 lb), SpO2 94.00%.  Physical Exam: General: Alert and awake, oriented x3, not in any acute distress, somewhat dry gauze (. HEENT: anicteric sclera, pupils reactive to light and accommodation, EOMI CVS: S1-S2 clear, no murmur rubs or gallops Chest: clear to auscultation bilaterally, no wheezing, rales or rhonchi Abdomen: soft nontender, nondistended, normal bowel sounds, no organomegaly Extremities: no cyanosis, clubbing or edema noted bilaterally Neuro: Cranial nerves II-XII intact, no focal neurological deficits  Lab Results: Basic Metabolic Panel:  Lab 05/04/12 3235 05/03/12 1338  NA 125* 121*  K 3.3* 3.6  CL 90* 84*  CO2 26 27  GLUCOSE 100* 120*  BUN 8 12  CREATININE 0.43* 0.49*  CALCIUM 8.6 8.8  MG -- --  PHOS -- --   Liver Function Tests:  Lab 05/04/12 0510  AST 28  ALT 18  ALKPHOS 78  BILITOT 0.8  PROT 5.7*  ALBUMIN 2.4*   No results found for this basename: LIPASE:2,AMYLASE:2 in the last 168 hours No results found for this basename: AMMONIA:2 in the last 168 hours CBC:  Lab 05/04/12 0510 05/03/12 1338  WBC 6.9 6.9  NEUTROABS -- --  HGB 9.0* 7.0*  HCT 25.3* 19.7*  MCV 94.8 98.5  PLT 295 355   Cardiac Enzymes: No results found for this basename: CKTOTAL:3,CKMB:3,CKMBINDEX:3,TROPONINI:3 in the last 168 hours BNP: No components found with this basename:  POCBNP:2 CBG: No results found for this basename: GLUCAP:5 in the last 168 hours   Micro Results: No results found for this or any previous visit (from the past 240 hour(s)).  Studies/Results: Dg Chest 2 View  05/03/2012  *RADIOLOGY REPORT*  Clinical Data: Weakness, dyspnea  CHEST - 2 VIEW  Comparison: 05/26/2010  Findings:  Grossly unchanged cardiac silhouette and mediastinal contours. Atherosclerotic calcifications within the thoracic aorta.  The lungs remain hyperexpanded with flattening of the bilateral hemidiaphragms.  There is mild diffuse thickening of the pulmonary interstitium.  No focal airspace opacity.  No pleural effusion or pneumothorax.  Grossly unchanged bones.  IMPRESSION: Hyperexpanded lungs without acute cardiopulmonary disease.   Original Report Authenticated By: Waynard Reeds, M.D.     Medications: Scheduled Meds:   . sodium chloride   Intravenous STAT  . cloNIDine  0.1 mg Oral QHS  . docusate sodium  100 mg Oral BID  . feeding supplement  237 mL Oral Q1500  . irbesartan  150 mg Oral Daily  . levothyroxine  25 mcg Oral Q breakfast  . pantoprazole (PROTONIX) IV  40 mg Intravenous Q12H   Continuous Infusions:   . DISCONTD: sodium chloride 20 mL/hr at 05/04/12 1202     Assessment/Plan: Active Problems:  Hyponatremia: Improving possibly secondary to HCTZ - Continue current management, sodium improving  Hypokalemia: Replaced   GI bleed: EGD today: few erosions at the GE junction with minimal spots  of fresh red blood (per Dr. Arlyce Dice) - Continue PPI - Okay to resume xarelto from GI standpoint if needed, I will check with orthopedics in a.m. if the patient needs to continue xarelto (was recommended 2 weeks postop for DVT prophylaxis) versus aspirin    Esophagitis continue PPI :   DVT Prophylaxis: SCDs   Code Status:  Disposition: patient is requesting different rehabilitation facility    LOS: 1 day   Margurette Brener M.D. [facility] 05/04/2012, 3:07 PM Pager: [phone]  If 7PM-7AM, please contact night-coverage [email or web address] Password [email or web address]

## 2012-05-04 NOTE — Progress Notes (Signed)
Endoscopy demonstrated few erosions at the GE junction with minimal spots of fresh red blood. While this could be the source for Hemoccult-positive stool, especially in the face of antiplatelet therapy with xarelto, it is less certain that this is a source for significant GI hemorrhage.  Recommend continue PPI therapy. May resume xarelto while carefully monitoring for active GI bleeding.  She will need followup Hemoccults in several weeks to determine if there is ongoing occult GI bleeding. If so, further workup would be warranted.

## 2012-05-04 NOTE — Progress Notes (Signed)
Clinical Social Work Department CLINICAL SOCIAL WORK PLACEMENT NOTE 05/04/2012  Patient:  ELBIA, LAFOUNTAIN  Account Number:  [id number] Admit date:  05/03/2012  Clinical Social Worker:  Doroteo Glassman  Date/time:  05/04/2012 03:30 PM  Clinical Social Work is seeking post-discharge placement for this patient at the following level of care:   SKILLED NURSING   (*CSW will update this form in Epic as items are completed)   Will give when offers received.  Patient/family provided with [facility] Department of Clinical Social Work's list of facilities offering this level of care within the geographic area requested by the patient (or if unable, by the patient's family).  05/04/2012  Patient/family informed of their freedom to choose among providers that offer the needed level of care, that participate in Medicare, Medicaid or managed care program needed by the patient, have an available bed and are willing to accept the patient.  05/04/2012  Patient/family informed of [facility]' ownership interest in [facility], as well as of the fact that they are under no obligation to receive care at this facility.  PASARR submitted to EDS on existing  PASARR number received from EDS on   FL2 transmitted to all facilities in geographic area requested by pt/family on  05/04/2012 FL2 transmitted to all facilities within larger geographic area on   Patient informed that his/her managed care company has contracts with or will negotiate with  certain facilities, including the following:     Patient/family informed of bed offers received:   Patient chooses bed at  Physician recommends and patient chooses bed at    Patient to be transferred to  on   Patient to be transferred to facility by   The following physician request were entered in Epic:   Additional Comments:  CSW to continue to follow.  Providence Crosby, LCSWA Clinical Social Work [phone]

## 2012-05-04 NOTE — Interval H&P Note (Signed)
History and Physical Interval Note:  05/04/2012 12:16 PM  [name] [name]  has presented today for surgery, with the diagnosis of GI bleed  The various methods of treatment have been discussed with the patient and family. After consideration of risks, benefits and other options for treatment, the patient has consented to  Procedure(s) (LRB) with comments: ESOPHAGOGASTRODUODENOSCOPY (EGD) (N/A) as a surgical intervention .  The patient's history has been reviewed, patient examined, no change in status, stable for surgery.  I have reviewed the patient's chart and labs.  Questions were answered to the patient's satisfaction.    The recent H&P (dated *05/03/12**) was reviewed, the patient was examined and there is no change in the patients condition since that H&P was completed.   [name]  05/04/2012, 12:16 PM    [name]

## 2012-05-04 NOTE — Progress Notes (Signed)
INITIAL ADULT NUTRITION ASSESSMENT Date: 05/04/2012   Time: 1:04 PM Reason for Assessment: Nutrition Risk   ASSESSMENT: Female 78 y.o.  Dx: Black Tarry Stools  INTERVENTION: 1. Will order patient Ensure nutrition supplement once daily to try for preference and to increase caloric and protein intake, provides 250 kcal and 9 grams of protein daily.  2. RD to follow for nutrition plan of care.   Hx:  Past Medical History  Diagnosis Date  . DDD (degenerative disc disease)   . Osteopenia   . GERD (gastroesophageal reflux disease)   . Hypertension   . Hyperlipidemia   . Vertigo, benign positional   . Hypothyroidism   . Anxiety   . H/O hiatal hernia   . Anemia   . Eczema   . Septicemia 2002    following UTI    Related Meds:  Scheduled Meds:   . sodium chloride   Intravenous STAT  . cloNIDine  0.1 mg Oral QHS  . docusate sodium  100 mg Oral BID  . irbesartan  150 mg Oral Daily  . levothyroxine  25 mcg Oral Q breakfast  .  morphine injection  4 mg Intravenous Once  . pantoprazole (PROTONIX) IV  40 mg Intravenous Q12H   Continuous Infusions:   . DISCONTD: sodium chloride 20 mL/hr at 05/04/12 1202   PRN Meds:.acetaminophen, ALPRAZolam, bisacodyl, morphine injection, oxyCODONE, traMADol, DISCONTD: butamben-tetracaine-benzocaine, DISCONTD: fentaNYL, DISCONTD: glycopyrrolate, DISCONTD: midazolam   Ht: 5\' 7"  (170.2 cm)  Wt: 146 lb (66.225 kg)  Ideal Wt: 61.4 kg % Ideal Wt: 108% Wt Readings from Last 10 Encounters:  05/03/12 146 lb (66.225 kg)  05/03/12 146 lb (66.225 kg)  05/03/12 146 lb (66.225 kg)  04/24/12 154 lb (69.854 kg)  04/24/12 154 lb (69.854 kg)  04/18/12 154 lb (69.854 kg)  *Weight down 8 lb over 3 weeks, 5% from baseline.   Body mass index is 22.87 kg/(m^2). (WNL)  Food/Nutrition Related Hx: Patient down in endo at time of RD visit. Spoke with patient's son. He reported PTA pt would eat well at breakfast meal, 80-90%. He reported her PO intake at lunch  and dinner were poor, 30% and that she would have to make herself eat something at these meals for energy. He is unsure of her UBW but reported that she has lost some weight. He reported she does not have any problems chewing or swallowing. He reported she does not drink any nutrition supplements at this time, however she would be willing to.   Labs:  CMP     Component Value Date/Time   NA 125* 05/04/2012 0510   K 3.3* 05/04/2012 0510   CL 90* 05/04/2012 0510   CO2 26 05/04/2012 0510   GLUCOSE 100* 05/04/2012 0510   BUN 8 05/04/2012 0510   CREATININE 0.43* 05/04/2012 0510   CALCIUM 8.6 05/04/2012 0510   PROT 5.7* 05/04/2012 0510   ALBUMIN 2.4* 05/04/2012 0510   AST 28 05/04/2012 0510   ALT 18 05/04/2012 0510   ALKPHOS 78 05/04/2012 0510   BILITOT 0.8 05/04/2012 0510   GFRNONAA >90 05/04/2012 0510   GFRAA >90 05/04/2012 0510    Intake/Output Summary (Last 24 hours) at 05/04/12 1311 Last data filed at 05/04/12 4540  Gross per 24 hour  Intake    700 ml  Output    850 ml  Net   -150 ml     Diet Order: General  Supplements/Tube Feeding: none at this time   IVF:    DISCONTD: sodium  chloride Last Rate: 20 mL/hr at 05/04/12 1202    Estimated Nutritional Needs:   Kcal: 1460-1660 Protein: 73-86 grams Fluid: 1 ml per kcal intake   NUTRITION DIAGNOSIS: -Predicted suboptimal energy intake (NI-1.6).  Status: Ongoing  RELATED TO: past PO intake history and unintentional weight loss  AS EVIDENCE BY: pt son reported patient eats about 30% at lunch and dinner meals and patient with 8 lb weight loss over 3 weeks.   MONITORING/EVALUATION(Goals): PO intake, weights, labs, diet tolerance. 1. Positive tolerance of diet advancement. 2. PO intake . 50% at meals.  3. Minimize weight loss  EDUCATION NEEDS: -No education needs identified at this time  INTERVENTION: 1. Will order patient Ensure nutrition supplement once daily to try for preference and to increase caloric and  protein intake, provides 250 kcal and 9 grams of protein daily.  2. RD to follow for nutrition plan of care.   Dietitian [phone]  DOCUMENTATION CODES Per approved criteria  -Not Applicable    Iven Finn [facility] 05/04/2012, 1:04 PM

## 2012-05-04 NOTE — Progress Notes (Signed)
Patient ID: [name] [name], female   DOB: 23-Apr-1931, 78 y.o.   MRN: [id number] [facility] Progress Note  Subjective: Feels Ok, hungry.. No stools, no c/o abdominal discomfort. HGB up to 9.0 Na up to 125  Objective:  Vital signs in last 24 hours: Temp:  [97.5 F (36.4 C)-99.2 F (37.3 C)] 97.8 F (36.6 C) (10/10 0534) Pulse Rate:  [68-94] 69  (10/10 0534) Resp:  [11-20] 18  (10/10 0534) BP: (106-167)/(31-87) 144/46 mmHg (10/10 0534) SpO2:  [96 %-100 %] 96 % (10/10 0534) Weight:  [146 lb (66.225 kg)] 146 lb (66.225 kg) (10/09 1701) Last BM Date: 05/03/12 General:   Alert,  Well-developed,  Elderly   in NAD Heart:  Regular rate and rhythm; no murmurs Pulm;clear Abdomen:  Soft, nontender and nondistended. Normal bowel sounds, without guarding, and without rebound.   Extremities:  Without edema. Neurologic:  Alert and  oriented x4;  grossly normal neurologically. Psych:  Alert and cooperative. Normal mood and affect.  Intake/Output from previous day: 10/09 0701 - 10/10 0700 In: 700 [Blood:700] Out: 600 [Urine:600] Intake/Output this shift: Total I/O In: -  Out: 250 [Urine:250]  Lab Results:  [facility] 05/04/12 0510 05/03/12 1338  WBC 6.9 6.9  HGB 9.0* 7.0*  HCT 25.3* 19.7*  PLT 295 355   BMET  Basename 05/04/12 0510 05/03/12 1338  NA 125* 121*  K 3.3* 3.6  CL 90* 84*  CO2 26 27  GLUCOSE 100* 120*  BUN 8 12  CREATININE 0.43* 0.49*  CALCIUM 8.6 8.8   LFT  Basename 05/04/12 0510  PROT 5.7*  ALBUMIN 2.4*  AST 28  ALT 18  ALKPHOS 78  BILITOT 0.8  BILIDIR --  IBILI --   PT/INR  Basename 05/03/12 1338  LABPROT 12.5  INR 0.94    Assessment / Plan: #1  78 yo female 10 days s/p knees replacement,on xarelto with black stool and drift in hgb 1-2 grams since discharge. She was on oral fe so black stool may have been due to Fe. No overt evidence of bleed- BUN normal on admit- will proceed with EGD today to r/o gastritis /PUD.  Decisions regarding  restarting xarelto pending EGD  #2 anemia- multipfactoral post op knee 9/30- improved post transfusions #3 Hyponatremia- improving- per med service Active Problems:  Hyponatremia  GI bleed  Anemia     LOS: 1 day   Devaney Segers  05/04/2012, 9:24 AM

## 2012-05-04 NOTE — Progress Notes (Signed)
Clinical Social Work Department BRIEF PSYCHOSOCIAL ASSESSMENT 05/04/2012  Patient:  [name] [name], [name] [name]     Account Number:  [id number]     Admit date:  05/03/2012  Clinical Social Worker:  Doree Albee  Date/Time:  05/04/2012 06:15 PM  Referred by:  RN  Date Referred:  05/03/2012 Referred for  SNF Placement   Other Referral:   Interview type:  Patient Other interview type:   and pt son    PSYCHOSOCIAL DATA Living Status:  FACILITY Admitted from facility:  [facility] Level of care:  Skilled Nursing Facility Primary support name:  Tammy Sours [place] Primary support relationship to patient:  CHILD, ADULT Degree of support available:   moderate    CURRENT CONCERNS Current Concerns  Post-Acute Placement   Other Concerns:    SOCIAL WORK ASSESSMENT / PLAN CSW met with pt at bedside to complete psychosocial assessment and assist with pt dc plans. Per chart review, pt is being admitted to medical floor.    Pt shared that she is a resident at [facility] for short term rehab. Pt stated, "Its ok for rehab, but I dont' want to be there longer than rehab." Pt then states, " I don't want to go back to [place], there has to be another place."    CSW and pt discussed that CSW can look for another skilled nursing facility placement. Pt shared that she would like to go to a better place if possible, then great, if not she would return to [facility].    Pt son was in the room during interview with permission from pt. Pt son interrupted and stated that patient could not come stay with him. Pt stated that she neeed more attention than what her son could provide.    CSW will initiate snf search process, please see placement note for snf placement status   Assessment/plan status:  Psychosocial Support/Ongoing Assessment of Needs Other assessment/ plan:   and discharge planning   Information/referral to community resources:   skilled nursing facility list.    PATIENT'S/FAMILY'S RESPONSE TO  PLAN OF CARE: Pt and pt son thanked csw for concern and support. Pt is motivated to return participate in rehab, however pt has concerns regarding return to [place]. Pt is open to other snf options as well.    Catha Gosselin, Theresia Majors  [phone] .05/04/2012 1815

## 2012-05-04 NOTE — Op Note (Signed)
[facility] [address] [place], [address]   ENDOSCOPY PROCEDURE REPORT  PATIENT: [name] [name], [name] [name].  MR#: [id number] BIRTHDATE: 1931-05-01 , 81  yrs. old GENDER: Female ENDOSCOPIST: Louis Meckel, MD REFERRED BY:  Geoffry Paradise, M.D.  Ollen Gross, M.D. PROCEDURE DATE:  05/04/2012 PROCEDURE:  EGD, diagnostic ASA CLASS:     Class II INDICATIONS:  anemia.   melena. MEDICATIONS: These medications were titrated to patient response per physician's verbal order, Versed 5 mg IV, Fentanyl 50 mcg IV, and Robinul 0.2 mg IV TOPICAL ANESTHETIC: Cetacaine Spray  DESCRIPTION OF PROCEDURE: After the risks benefits and alternatives of the procedure were thoroughly explained, informed consent was obtained.  The Pentax Gastroscope [id number] endoscope was introduced through the mouth and advanced to the third portion of the duodenum. Without limitations.  There was some difficulty passing the scope to and through the upper esophageal sphincter. A Savary guidewire was passed prior to scope insertion to allow careful guidance of the scope into the esophagus. The instrument was slowly withdrawn as the mucosa was fully examined.    At the GE junction on the gastric side there were several few erosions.  There were minimal amounts of fresh blood.   The remainder of the upper endoscopy exam was otherwise normal. Retroflexed views revealed no abnormalities.     The scope was then withdrawn from the patient and the procedure completed.  COMPLICATIONS: There were no complications. ENDOSCOPIC IMPRESSION: 1. erosions at the GE junction  Findings may explain Hemoccult-positive stool. It is less clear that this is a source for her significant GI hemorrhage, even in the face of antiplatelet therapy  RECOMMENDATIONS: 1.  continue PPI therapy[Recommendations] 2.  okay to resume xarelto while carefully monitoring for GI bleeding REPEAT EXAM:  eSigned:  Louis Meckel, MD  05/04/2012 12:40 PM   CC:

## 2012-05-05 DIAGNOSIS — M1710 Unilateral primary osteoarthritis, unspecified knee: Secondary | ICD-10-CM

## 2012-05-05 DIAGNOSIS — D649 Anemia, unspecified: Secondary | ICD-10-CM

## 2012-05-05 DIAGNOSIS — IMO0002 Reserved for concepts with insufficient information to code with codable children: Secondary | ICD-10-CM

## 2012-05-05 LAB — TYPE AND SCREEN
ABO/RH(D): O POS
DAT, IgG: NEGATIVE
Unit division: 0

## 2012-05-05 LAB — BASIC METABOLIC PANEL
BUN: 5 mg/dL — ABNORMAL LOW (ref 6–23)
BUN: 7 mg/dL (ref 6–23)
CO2: 25 mEq/L (ref 19–32)
Calcium: 9.1 mg/dL (ref 8.4–10.5)
Chloride: 91 mEq/L — ABNORMAL LOW (ref 96–112)
GFR calc non Af Amer: >90 mL/min (ref >90)
Glucose, Bld: 105 mg/dL — ABNORMAL HIGH (ref 70–99)
Glucose, Bld: 108 mg/dL — ABNORMAL HIGH (ref 70–99)
Potassium: 3.6 mEq/L (ref 3.5–5.1)

## 2012-05-05 LAB — CBC
HCT: 26.3 % — ABNORMAL LOW (ref 36.0–46.0)
Hemoglobin: 9.3 g/dL — ABNORMAL LOW (ref 12.0–15.0)
MCH: 33 pg (ref 26.0–34.0)
MCHC: 35.4 g/dL (ref 30.0–36.0)
MCV: 93.3 fL (ref 78.0–100.0)

## 2012-05-05 MED ORDER — SODIUM CHLORIDE 0.9 % IV SOLN
INTRAVENOUS | Status: DC
  Administered 2012-05-05 – 2012-05-06 (×3): via INTRAVENOUS

## 2012-05-05 MED ORDER — RIVAROXABAN 10 MG PO TABS
10.0000 mg | ORAL_TABLET | Freq: Every day | ORAL | Status: DC
  Administered 2012-05-06 – 2012-05-08 (×3): 10 mg via ORAL
  Filled 2012-05-05 (×4): qty 1

## 2012-05-05 MED ORDER — AMLODIPINE BESYLATE 10 MG PO TABS
10.0000 mg | ORAL_TABLET | Freq: Every day | ORAL | Status: DC
  Administered 2012-05-05 – 2012-05-08 (×4): 10 mg via ORAL
  Filled 2012-05-05 (×4): qty 1

## 2012-05-05 NOTE — Progress Notes (Signed)
Source for GI blood loss not clearly identified by EGD. Recommend f/u hemeoccults; if positive recommend further GI evaluation as outpt once she is off xarelto.  Should she develop acute bleeding, then would w/u expeditiously.  I also recommend that she be monitored for 24-48 hours while on xarelto to be certain that Hg remains stable and that she does not develop recurrent GI bleeding.

## 2012-05-05 NOTE — Progress Notes (Signed)
Occupational Therapy Evaluation Patient Details Name: [name] [name] MRN: [id number] DOB: 1930/08/12 Today's Date: 05/05/2012 Time: 0930-1000 OT Time Calculation (min): 30 min  OT Assessment / Plan / Recommendation Clinical Impression  This 78 year old female was admitted with hyponatremia.  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  She is approriate for skilled OT to increase independence with adls with supervision level goals in acute    OT Assessment  Patient needs continued OT Services    Follow Up Recommendations  Skilled nursing facility    Barriers to Discharge      Equipment Recommendations  3 in 1 bedside comode    Recommendations for Other Services    Frequency  Min 2X/week    Precautions / Restrictions Restrictions Weight Bearing Restrictions: Yes RLE Weight Bearing: Weight bearing as tolerated   Pertinent Vitals/Pain R LE sore/burning when she stood on it; repositioned and ice applied    ADL  Grooming: Performed;Teeth care;Set up Where Assessed - Grooming: Supported sitting Upper Body Bathing: Performed;Supervision/safety Where Assessed - Upper Body Bathing: Unsupported sitting Lower Body Bathing: Performed;Minimal assistance Where Assessed - Lower Body Bathing: Supported sit to stand Upper Body Dressing: Performed;Set up Where Assessed - Upper Body Dressing: Unsupported sitting Lower Body Dressing: Simulated;Moderate assistance Where Assessed - Lower Body Dressing: Supported sit to Pharmacist, hospital: Mining engineer Method: Sit to Barista:  (walked around bed to recliner) Toileting - Clothing Manipulation and Hygiene: Simulated;Min guard Where Assessed - Toileting Clothing Manipulation and Hygiene: Standing Transfers/Ambulation Related to ADLs: min guard to ambulate around bed ADL Comments: pt feeling better than when she was admitted.  Got fatiqued near end of session    OT Diagnosis:  Generalized weakness  OT Problem List: Decreased strength;Decreased activity tolerance;Decreased knowledge of use of DME or AE;Pain OT Treatment Interventions: Self-care/ADL training;DME and/or AE instruction;Patient/family education   OT Goals Acute Rehab OT Goals OT Goal Formulation: With patient Time For Goal Achievement: 05/19/12 Potential to Achieve Goals: Good ADL Goals Pt Will Perform Lower Body Bathing: with supervision;Sit to stand from bed ADL Goal: Lower Body Bathing - Progress: Goal set today Pt Will Perform Lower Body Dressing: with min assist;Sit to stand from chair ADL Goal: Lower Body Dressing - Progress: Goal set today Pt Will Transfer to Toilet: with supervision;Ambulation;3-in-1 ADL Goal: Toilet Transfer - Progress: Goal set today Pt Will Perform Toileting - Hygiene: with supervision;Sit to stand from 3-in-1/toilet ADL Goal: Toileting - Hygiene - Progress: Goal set today  Visit Information  Last OT Received On: 05/05/12 Assistance Needed: +1    Subjective Data  Subjective: Thank you for helping me get washed up Patient Stated Goal: Back to rehab   Prior Functioning     Home Living Lives With:  (had been at [facility] for rehab) Prior Function Level of Independence: Independent with assistive device(s) Communication Communication: No difficulties Dominant Hand: Right         Vision/Perception     Cognition  Overall Cognitive Status: Appears within functional limits for tasks assessed/performed Arousal/Alertness: Awake/alert Orientation Level: Appears intact for tasks assessed Behavior During Session: [facility] for tasks performed    Extremity/Trunk Assessment Right Upper Extremity Assessment RUE ROM/Strength/Tone: [facility] for tasks assessed Left Upper Extremity Assessment LUE ROM/Strength/Tone: [facility] for tasks assessed     Mobility Bed Mobility Supine to Sit: 6: Modified independent (Device/Increase time) Transfers Sit to Stand: 4: Min assist;From  bed;With upper extremity assist;From elevated surface  Shoulder Instructions     Exercise     Balance     End of Session OT - End of Session Activity Tolerance: Patient limited by fatigue Patient left: in chair;with call bell/phone within reach;with family/visitor present  GO     [name] [name] 05/05/2012, 10:08 AM

## 2012-05-05 NOTE — Evaluation (Signed)
Physical Therapy Evaluation Patient Details Name: [name] [name] MRN: [id number] DOB: [birth year] Today's Date: 05/05/2012 Time: 4098-1191 PT Time Calculation (min): 24 min  PT Assessment / Plan / Recommendation Clinical Impression  78 yo female admitted with hyponatremia. S/p R TKA 04/24/12. Demonstrates weakness, decreased activity tolerance, decreased ROM. Recommend ST rehab at SNF for continued rehab.     PT Assessment  Patient needs continued PT services    Follow Up Recommendations  Post acute inpatient    Does the patient have the potential to tolerate intense rehabilitation   No, Recommend SNF  Barriers to Discharge        Equipment Recommendations  None recommended by PT    Recommendations for Other Services OT consult   Frequency Min 5X/week    Precautions / Restrictions Precautions Precautions: Knee Restrictions Weight Bearing Restrictions: No RLE Weight Bearing: Weight bearing as tolerated   Pertinent Vitals/Pain 6/10 r knee      Mobility  Bed Mobility Bed Mobility: Supine to Sit;Sit to Supine Supine to Sit: 6: Modified independent (Device/Increase time) Sit to Supine: 4: Min assist Details for Bed Mobility Assistance: Assist for LEs back onto bed. Increased time.  Transfers Transfers: Sit to Stand;Stand to Sit Sit to Stand: 4: Min assist;From bed Stand to Sit: 4: Min assist;To bed Details for Transfer Assistance: VCs safety, technique, hand placement. Assist to rise, stabilize, control descent.  Ambulation/Gait Ambulation/Gait Assistance: 4: Min assist Ambulation Distance (Feet): 30 Feet Assistive device: Rolling walker Ambulation/Gait Assistance Details: VCs safety, posture. Assist to stabilize throughout ambulation. Distance limited by fatigue. Pt declined to ambulate in hallway.  Gait Pattern: Step-through pattern;Decreased stride length;Decreased step length - right;Decreased step length - left;Antalgic    Shoulder Instructions     Exercises  Total Joint Exercises Ankle Circles/Pumps: AROM;Both;10 reps;Supine Quad Sets: AROM;Both;10 reps;Supine Short Arc Quad: AROM;Right;10 reps;Supine Heel Slides: AAROM;Right;10 reps;Supine Hip ABduction/ADduction: AROM;Right;10 reps;Supine Straight Leg Raises: AAROM;Right;10 reps;Supine   PT Diagnosis: Difficulty walking;Abnormality of gait;Generalized weakness;Acute pain  PT Problem List: Decreased strength;Decreased range of motion;Decreased activity tolerance;Decreased mobility;Pain;Decreased knowledge of use of DME PT Treatment Interventions: DME instruction;Gait training;Functional mobility training;Therapeutic activities;Therapeutic exercise;Patient/family education   PT Goals Acute Rehab PT Goals Pt will go Supine/Side to Sit: with modified independence PT Goal: Supine/Side to Sit - Progress: Goal set today Pt will go Sit to Supine/Side: with modified independence PT Goal: Sit to Supine/Side - Progress: Goal set today Pt will go Sit to Stand: with supervision PT Goal: Sit to Stand - Progress: Goal set today Pt will go Stand to Sit: with supervision PT Goal: Stand to Sit - Progress: Goal set today Pt will Ambulate: 51 - 150 feet;with supervision;with least restrictive assistive device PT Goal: Ambulate - Progress: Goal set today  Visit Information  Last PT Received On: 05/05/12 Assistance Needed: +1    Subjective Data  Subjective: "I'm just so tired" Patient Stated Goal: Feel better   Prior Functioning       Cognition  Overall Cognitive Status: Appears within functional limits for tasks assessed/performed Arousal/Alertness: Awake/alert Orientation Level: Appears intact for tasks assessed Behavior During Session: [facility] for tasks performed    Extremity/Trunk Assessment Right Lower Extremity Assessment RLE ROM/Strength/Tone: Deficits RLE ROM/Strength/Tone Deficits: SLR 3-/5, fair quad set, moves ankle well. Knee ROM 3-75 degrees RLE Sensation: WFL - Light Touch Left Lower  Extremity Assessment LLE ROM/Strength/Tone: WFL for tasks assessed Trunk Assessment Trunk Assessment: Normal   Balance    End of Session PT - End of Session  Equipment Utilized During Treatment: Gait belt Activity Tolerance: Patient limited by fatigue Patient left: in bed;with call bell/phone within reach  GP     [name] [facility] 05/05/2012, 3:17 PM [phone]

## 2012-05-05 NOTE — Progress Notes (Addendum)
Provided Pt with bed offers.  Pt interested in [facility], as she would like to be in a facility where Dr. Jacky Kindle has privileges.    CSW to coordinate admission to [facility], when Pt medically ready.  Providence Crosby, LCSWA Clinical Social Work [phone]

## 2012-05-05 NOTE — Progress Notes (Signed)
Per MD, Pt may be ready to d/c tomorrow.  Notified Janie at [employer].  Wille Celeste stating that a family member will need to sign paperwork today for a Saturday admission.  Notified Pt's son, Harlow Basley.  Per son, family unable to sign paperwork today.  Notified Janie and MD.  Pt likely ready for d/c on Monday.  Providence Crosby, LCSWA Clinical Social Work [phone]

## 2012-05-05 NOTE — Progress Notes (Signed)
Patient ID: [name] [name]  female  ZOX:[id number]    DOB: 1931/06/26    DOA: 05/03/2012  PCP: Minda Meo, MD  Subjective: Patient seen earlier today, denies any specific complaints at this time. Had a rough night per patient, did not sleep well. Per son, patient had diarrhea.     Objective: Weight change:   Intake/Output Summary (Last 24 hours) at 05/05/12 1833 Last data filed at 05/05/12 1401  Gross per 24 hour  Intake    540 ml  Output      5 ml  Net    535 ml   Blood pressure 144/74, pulse 75, temperature 98.6 F (37 C), temperature source Oral, resp. rate 18, height 5\' 7"  (1.702 m), weight 66.225 kg (146 lb), SpO2 93.00%.  Physical Exam: General: Alert and awake, oriented x3, not in any acute distress, somewhat dry gauze (. HEENT: anicteric sclera, pupils reactive to light and accommodation, EOMI CVS: S1-S2 clear, no murmur rubs or gallops Chest: clear to auscultation bilaterally, no wheezing, rales or rhonchi Abdomen: soft nontender, nondistended, normal bowel sounds, no organomegaly Extremities: no cyanosis, clubbing or edema noted bilaterally Neuro: Cranial nerves II-XII intact, no focal neurological deficits  Lab Results: Basic Metabolic Panel:  Lab 05/05/12 8119 05/05/12 0840  NA 126* 125*  K 3.8 3.6  CL 92* 91*  CO2 23 25  GLUCOSE 108* 105*  BUN 7 5*  CREATININE 0.43* 0.42*  CALCIUM 9.1 8.8  MG -- --  PHOS -- --   Liver Function Tests:  Lab 05/04/12 0510  AST 28  ALT 18  ALKPHOS 78  BILITOT 0.8  PROT 5.7*  ALBUMIN 2.4*   CBC:  Lab 05/05/12 0840 05/04/12 0510  WBC 5.6 6.9  NEUTROABS -- --  HGB 9.3* 9.0*  HCT 26.3* 25.3*  MCV 93.3 94.8  PLT 287 295     Micro Results: No results found for this or any previous visit (from the past 240 hour(s)).  Studies/Results: Dg Chest 2 View  05/03/2012  *RADIOLOGY REPORT*  Clinical Data: Weakness, dyspnea  CHEST - 2 VIEW  Comparison: 05/26/2010  Findings:  Grossly unchanged cardiac silhouette and  mediastinal contours. Atherosclerotic calcifications within the thoracic aorta.  The lungs remain hyperexpanded with flattening of the bilateral hemidiaphragms.  There is mild diffuse thickening of the pulmonary interstitium.  No focal airspace opacity.  No pleural effusion or pneumothorax.  Grossly unchanged bones.  IMPRESSION: Hyperexpanded lungs without acute cardiopulmonary disease.   Original Report Authenticated By: Waynard Reeds, M.D.     Medications: Scheduled Meds:    . amLODipine  10 mg Oral Daily  . cloNIDine  0.1 mg Oral QHS  . feeding supplement  237 mL Oral Q1500  . levothyroxine  25 mcg Oral Q breakfast  . pantoprazole (PROTONIX) IV  40 mg Intravenous Q12H  . rivaroxaban  10 mg Oral Q breakfast  . DISCONTD: docusate sodium  100 mg Oral BID  . DISCONTD: irbesartan  150 mg Oral Daily   Continuous Infusions:    . sodium chloride 100 mL/hr at 05/05/12 1202     Assessment/Plan: Active Problems:  Hyponatremia: Improving possibly secondary to HCTZ, patient appears to be hypovolemic - Continue current management, and add normal saline IV fluid, recheck BMET shows improving sodium   GI bleed: EGD today: few erosions at the GE junction with minimal spots of fresh red blood (per Dr. Arlyce Dice) - Continue PPI - Okay to resume xarelto from GI and orthopedic standpoint if needed -  restarted xarelto for another 10 days  S/P TKA: appreciate orthopedics recommendation, weight bearing as tolerated to right leg   Esophagitis: continue PPI   Diarrhea: obtain Cdiff, stop colace  DVT Prophylaxis: SCDs   Code Status:  Disposition: patient is requesting different rehabilitation facility. Discussed with patient's son in detail    LOS: 2 days   Smrithi Pigford M.D. [facility] 05/05/2012, 6:33 PM Pager: [phone]  If 7PM-7AM, please contact night-coverage [email or web address] Password [email or web address]

## 2012-05-05 NOTE — Progress Notes (Addendum)
Subjective: 11 Days Post-Op Procedure(s) (LRB):  TOTAL KNEE ARTHROPLASTY (Right)  1 Day Post-Op Procedure(s) (LRB): ESOPHAGOGASTRODUODENOSCOPY (EGD) (N/A) Patient reports pain as mild.   Patient seen in rounds for Dr. Lequita Halt.  Notified of her admission by [name] Esterwood PA-C. Patient was recently admitted for black tarry stools and found to have low hemoglobin of 5.8 at the nursing home prior to admission.  HGB rechecked and was found to be at 7.0.  She was transfused and HGB is back up to 9.3. It was felt that it was [facility] from a GI standpoint to resume Xarelto.  Continue the plan for 21 day total post op treatment.  Will only need it for 10 more days. Dr. Lequita Halt notified of her admission and he would like to see her next Tuesday evening, 06/09/2012.  Please have [facility] set up the appointment and help arrange the transportation.  Plan is to go Skilled nursing facility after hospital stay.  Objective: Vital signs in last 24 hours: Temp:  [97.7 F (36.5 C)-98.4 F (36.9 C)] 97.7 F (36.5 C) (10/11 0620) Pulse Rate:  [69-87] 69  (10/11 0620) Resp:  [11-73] 20  (10/11 0620) BP: (139-200)/(46-106) 162/56 mmHg (10/11 0620) SpO2:  [93 %-100 %] 97 % (10/11 0620)  Intake/Output from previous day:  Intake/Output Summary (Last 24 hours) at 05/05/12 1102 Last data filed at 05/05/12 0829  Gross per 24 hour  Intake    540 ml  Output      7 ml  Net    533 ml    Intake/Output this shift: Total I/O In: 300 [P.O.:300] Out: 1 [Urine:1]  Labs:  [facility] 05/05/12 0840 05/04/12 0510 05/03/12 1338  HGB 9.3* 9.0* 7.0*    Basename 05/05/12 0840 05/04/12 0510  WBC 5.6 6.9  RBC 2.82* 2.67*  HCT 26.3* 25.3*  PLT 287 295    Basename 05/05/12 0840 05/04/12 0510  NA 125* 125*  K 3.6 3.3*  CL 91* 90*  CO2 25 26  BUN 5* 8  CREATININE 0.42* 0.43*  GLUCOSE 105* 100*  CALCIUM 8.8 8.6    Basename 05/03/12 1338  LABPT --  INR 0.94    EXAM General - Patient is Alert, Appropriate  and Oriented Extremity - Neurovascular intact Sensation intact distally Dorsiflexion/Plantar flexion intact Compartment soft Dressing/Incision - clean, dry, no drainage, healing, fair amount of ecchymosis noted around the knee. Motor Function - intact, moving foot and toes well on exam.  She is already actively bending the knee close to 90 degrees.  Past Medical History  Diagnosis Date  . DDD (degenerative disc disease)   . Osteopenia   . GERD (gastroesophageal reflux disease)   . Hypertension   . Hyperlipidemia   . Vertigo, benign positional   . Hypothyroidism   . Anxiety   . H/O hiatal hernia   . Anemia   . Eczema   . Septicemia 2002    following UTI    Assessment/Plan: 1 Day Post-Op Procedure(s) (LRB): ESOPHAGOGASTRODUODENOSCOPY (EGD) (N/A) Active Problems:  Hyponatremia  GI bleed  Anemia  Esophagitis  Estimated Body mass index is 22.87 kg/(m^2) as calculated from the following:   Height as of this encounter: 5\' 7" (1.702 m).   Weight as of this encounter: 146 lb(66.225 kg). Up with therapy Discharge to SNF when medically stable and bed available.  Dr. Lequita Halt notified of her admission and he would like to see her next Tuesday evening, 06/09/2012.  Will have [facility] set up the appointment and help arrange the  transportation.   Follow up recommendations and TKA instructions have been added to the patient's discharge plan.  Continue stool softner and laxative as needed. Do not submerge incision under water. May shower. Does not need dressing at this time. Continue to use ice for pain and swelling from surgery. Continue Total Knee Protocol and therapy at the SNF.  DVT Prophylaxis - Xarelto for 10 more days. Weight-Bearing as tolerated to right leg.  PERKINS, ALEXZANDREW 05/05/2012, 11:02 AM

## 2012-05-05 NOTE — Progress Notes (Signed)
Patient requests to have tylenol pm and xanax before she goes to sleep at night because she has a difficult time sleeping.

## 2012-05-05 NOTE — Progress Notes (Signed)
Patient ID: [name] [name], female   DOB: 05/30/1931, 78 y.o.   MRN: [id number] [facility] Progress Note  Subjective: Rough night, could not sleep,otherwise doing Ok- lots of questions about knee,Xarelto etc.   Objective:  Vital signs in last 24 hours: Temp:  [97.7 F (36.5 C)-98.4 F (36.9 C)] 97.7 F (36.5 C) (10/11 0620) Pulse Rate:  [69-87] 69  (10/11 0620) Resp:  [11-73] 20  (10/11 0620) BP: (139-200)/(46-106) 162/56 mmHg (10/11 0620) SpO2:  [93 %-100 %] 97 % (10/11 0620) Last BM Date: 05/04/12 (dark and bloody per pt) General:   Alert,  Well-developed,    in NAD Heart:  Regular rate and rhythm; no murmurs Pulm;clear Abdomen:  Soft, nontender and nondistended. Normal bowel sounds, Extremities:  Without edema. Neurologic:  Alert and  oriented x4;  grossly normal neurologically. Psych:  Alert and cooperative. Normal mood and affect.  Intake/Output from previous day: 10/10 0701 - 10/11 0700 In: 240 [P.O.:240] Out: 256 [Urine:255; Stool:1] Intake/Output this shift: Total I/O In: 300 [P.O.:300] Out: 1 [Urine:1]  Lab Results:  Basename 05/05/12 0840 05/04/12 0510 05/03/12 1338  WBC 5.6 6.9 6.9  HGB 9.3* 9.0* 7.0*  HCT 26.3* 25.3* 19.7*  PLT 287 295 355   BMET  Basename 05/04/12 0510 05/03/12 1338  NA 125* 121*  K 3.3* 3.6  CL 90* 84*  CO2 26 27  GLUCOSE 100* 120*  BUN 8 12  CREATININE 0.43* 0.49*  CALCIUM 8.6 8.8   LFT  Basename 05/04/12 0510  PROT 5.7*  ALBUMIN 2.4*  AST 28  ALT 18  ALKPHOS 78  BILITOT 0.8  BILIDIR --  IBILI --   PT/INR  Basename 05/03/12 1338  LABPROT 12.5  INR 0.94   Assessment / Plan: #1 78 yo female s/p knee replacement 9/30 -on xareltopost op readmitted with progressive anemia, and dark stools. No evidence for ongoing blood loss since admit Egd with erosions only at GE junction Hgb very stable since transfusions  Ok from GI standpoint to resume Katy Fitch was for 21 days total post op  I have spoken to  [facility] )- they will see her, today in follow up Please discharge on protonix 40 mg twice daily x 2 weeks then daily She needs a follow up CBC next week- Dr. Jacky Kindle is her primary Active Problems:  Hyponatremia  GI bleed  Anemia  Esophagitis     LOS: 2 days   [name] [name]  05/05/2012, 9:07 AM

## 2012-05-06 LAB — CLOSTRIDIUM DIFFICILE BY PCR: Toxigenic C. Difficile by PCR: NEGATIVE

## 2012-05-06 LAB — BASIC METABOLIC PANEL
Calcium: 9 mg/dL (ref 8.4–10.5)
Creatinine, Ser: 0.39 mg/dL — ABNORMAL LOW (ref 0.50–1.10)
GFR calc non Af Amer: >90 mL/min (ref >90)
Glucose, Bld: 96 mg/dL (ref 70–99)
Sodium: 128 mEq/L — ABNORMAL LOW (ref 135–145)

## 2012-05-06 LAB — CBC
MCH: 31.9 pg (ref 26.0–34.0)
MCHC: 33.8 g/dL (ref 30.0–36.0)
MCV: 94.6 fL (ref 78.0–100.0)
Platelets: 303 10*3/uL (ref 150–400)

## 2012-05-06 NOTE — Progress Notes (Signed)
Patient ID: [name] [name]  female  WUJ:[id number]    DOB: [birth year]    DOA: 05/03/2012  PCP: Minda Meo, MD  Subjective: Feels a whole lot improved, states no further diarrhea. Slept well last night, son at the bedside  Objective: Weight change:   Intake/Output Summary (Last 24 hours) at 05/06/12 1220 Last data filed at 05/06/12 0817  Gross per 24 hour  Intake   2375 ml  Output    802 ml  Net   1573 ml   Blood pressure 150/68, pulse 70, temperature 98.3 F (36.8 C), temperature source Oral, resp. rate 17, height 5\' 7"  (1.702 m), weight 66.225 kg (146 lb), SpO2 99.00%.  Physical Exam: General: Alert and awake, oriented x3, not in any acute distress, HEENT: anicteric sclera, pupils reactive to light and accommodation, EOMI CVS: S1-S2 clear, no murmur rubs or gallops Chest: clear to auscultation bilaterally, no wheezing, rales or rhonchi Abdomen: soft nontender, nondistended, normal bowel sounds, no organomegaly Extremities: no cyanosis, clubbing or edema noted bilaterally   Lab Results: Basic Metabolic Panel:  Lab 05/06/12 9562 05/05/12 1549  NA 128* 126*  K 3.6 3.8  CL 94* 92*  CO2 25 23  GLUCOSE 96 108*  BUN 5* 7  CREATININE 0.39* 0.43*  CALCIUM 9.0 9.1  MG -- --  PHOS -- --   Liver Function Tests:  Lab 05/04/12 0510  AST 28  ALT 18  ALKPHOS 78  BILITOT 0.8  PROT 5.7*  ALBUMIN 2.4*   CBC:  Lab 05/06/12 0605 05/05/12 0840  WBC 5.2 5.6  NEUTROABS -- --  HGB 10.0* 9.3*  HCT 29.6* 26.3*  MCV 94.6 93.3  PLT 303 287      Studies/Results: Dg Chest 2 View  05/03/2012  *RADIOLOGY REPORT*  Clinical Data: Weakness, dyspnea  CHEST - 2 VIEW  Comparison: 05/26/2010  Findings:  Grossly unchanged cardiac silhouette and mediastinal contours. Atherosclerotic calcifications within the thoracic aorta.  The lungs remain hyperexpanded with flattening of the bilateral hemidiaphragms.  There is mild diffuse thickening of the pulmonary interstitium.  No focal airspace  opacity.  No pleural effusion or pneumothorax.  Grossly unchanged bones.  IMPRESSION: Hyperexpanded lungs without acute cardiopulmonary disease.   Original Report Authenticated By: Waynard Reeds, M.D.     Medications: Scheduled Meds:    . amLODipine  10 mg Oral Daily  . cloNIDine  0.1 mg Oral QHS  . feeding supplement  237 mL Oral Q1500  . levothyroxine  25 mcg Oral Q breakfast  . pantoprazole (PROTONIX) IV  40 mg Intravenous Q12H  . rivaroxaban  10 mg Oral Q breakfast   Continuous Infusions:    . sodium chloride 100 mL/hr at 05/06/12 1308     Assessment/Plan: Active Problems:  Hyponatremia: Improving possibly secondary to HCTZ, dehydration  - Continue current management, normal saline IV fluid   GI bleed: H&H stable - EGD : few erosions at the GE junction with minimal spots of fresh red blood (per Dr. Arlyce Dice) - Continue PPI - Okay to resume xarelto from GI and orthopedic standpoint, restarted xarelto for another 10 days  S/P TKA: appreciate orthopedics recommendation, weight bearing as tolerated to right leg   Esophagitis: continue PPI   Diarrhea: Cdiff pending, per patient resolved  DVT Prophylaxis: SCDs   Code Status:  Disposition: Discussed with patient's son in detail at bedside, patient accepted at [facility] SNF, likely DC Monday   LOS: 3 days   Itsel Opfer M.D. [facility] 05/06/2012, 12:20  PM Pager: [phone]  If 7PM-7AM, please contact night-coverage [email or web address] Password [email or web address]

## 2012-05-06 NOTE — Progress Notes (Signed)
Physical Therapy Treatment Patient Details Name: [name] [name] MRN: [id number] DOB: 24-Sep-1930 Today's Date: 05/06/2012 Time: 4098-1191 PT Time Calculation (min): 40 min  PT Assessment / Plan / Recommendation Comments on Treatment Session  Great improvement today with increased gait distance and less overall assistiance required with mobility. Exercise handout issued to pt and family for pt to work on knee strenghening on her own between therapy sesssions.    Follow Up Recommendations  Post acute inpatient     Does the patient have the potential to tolerate intense rehabilitation  No, Recommend SNF     Equipment Recommendations  None recommended by PT       Frequency Min 5X/week   Plan Discharge plan remains appropriate;Frequency remains appropriate    Precautions / Restrictions Precautions Precautions: Knee Restrictions RLE Weight Bearing: Weight bearing as tolerated       Mobility  Bed Mobility Supine to Sit: 4: Min assist Details for Bed Mobility Assistance: pt was able to advance right leg with belt and cues, no physical assistance needed. min assist with trunk transition into seated postion to complete transfer to seated edge of bed. Transfers Sit to Stand: 4: Min assist;With upper extremity assist;From bed Sit to Stand: Patient Percentage: 80% Stand to Sit: 4: Min guard;With upper extremity assist;With armrests;To chair/3-in-1 Details for Transfer Assistance: min cues for right LE placement and hand placement with transfers. pt with controlled decent sitting into recliner. Ambulation/Gait Ambulation/Gait Assistance: 4: Min assist Ambulation Distance (Feet): 50 Feet Assistive device: Rolling walker Ambulation/Gait Assistance Details: min cues for posture, sequence of gait, walker postion (not to get too close to front of walker and to stay with walker with turning). Gait Pattern: Antalgic;Decreased stride length;Decreased step length - left;Step-through  pattern;Decreased step length - right    Exercises Total Joint Exercises Ankle Circles/Pumps: AROM;Both;10 reps;Supine Quad Sets: AROM;Strengthening;Right;10 reps;Supine Heel Slides: AAROM;Strengthening;Right;15 reps;Supine (x10 AA with PTA, X5 AA with belt) Hip ABduction/ADduction: AAROM;Strengthening;Right;15 reps;Supine (x10 with PTA, X5 with belt) Straight Leg Raises: AAROM;Strengthening;Right;15 reps;Supine (x10 with PTA, X5 with belt)    PT Goals Acute Rehab PT Goals PT Goal: Supine/Side to Sit - Progress: Progressing toward goal PT Goal: Sit to Stand - Progress: Progressing toward goal PT Goal: Stand to Sit - Progress: Progressing toward goal PT Goal: Ambulate - Progress: Progressing toward goal  Visit Information  Last PT Received On: 05/06/12 Assistance Needed: +1    Subjective Data  Subjective: Reports feeling "great" today and having more energy.    Cognition  Overall Cognitive Status: Appears within functional limits for tasks assessed/performed Arousal/Alertness: Awake/alert Orientation Level: Appears intact for tasks assessed Behavior During Session: [facility] for tasks performed       End of Session PT - End of Session Equipment Utilized During Treatment: Gait belt Activity Tolerance: Patient tolerated treatment well Patient left: in chair;with call bell/phone within reach;with family/visitor present Nurse Communication: Mobility status    Sallyanne Kuster 05/06/2012, 12:35 PM  Sallyanne Kuster, PTA Office- [phone]

## 2012-05-07 LAB — CBC
MCHC: 34 g/dL (ref 30.0–36.0)
MCV: 94.5 fL (ref 78.0–100.0)
Platelets: 279 10*3/uL (ref 150–400)
RDW: 17.4 % — ABNORMAL HIGH (ref 11.5–15.5)
WBC: 3.6 10*3/uL — ABNORMAL LOW (ref 4.0–10.5)

## 2012-05-07 LAB — BASIC METABOLIC PANEL
BUN: 6 mg/dL (ref 6–23)
Chloride: 97 mEq/L (ref 96–112)
Creatinine, Ser: 0.46 mg/dL — ABNORMAL LOW (ref 0.50–1.10)
GFR calc Af Amer: >90 mL/min (ref >90)
GFR calc non Af Amer: >90 mL/min (ref >90)

## 2012-05-07 LAB — OSMOLALITY: Osmolality: 267 mOsm/kg — ABNORMAL LOW (ref 275–300)

## 2012-05-07 MED ORDER — POTASSIUM CHLORIDE CRYS ER 20 MEQ PO TBCR
40.0000 meq | EXTENDED_RELEASE_TABLET | Freq: Once | ORAL | Status: AC
  Administered 2012-05-07: 40 meq via ORAL
  Filled 2012-05-07: qty 2

## 2012-05-07 NOTE — Progress Notes (Signed)
Patient ID: [name] [name]  female  ZOX:[id number]    DOB: May 12, 1931    DOA: 05/03/2012  PCP: Minda Meo, MD  Subjective: No further diarrhea. No complaints, no fever, chills, pain, nausea/vomiting. Friend at bed side.  Objective: Weight change:   Intake/Output Summary (Last 24 hours) at 05/07/12 1459 Last data filed at 05/07/12 0600  Gross per 24 hour  Intake   1200 ml  Output    500 ml  Net    700 ml   Blood pressure 152/57, pulse 80, temperature 98.8 F (37.1 C), temperature source Oral, resp. rate 18, height 5\' 7"  (1.702 m), weight 66.225 kg (146 lb), SpO2 99.00%.  Physical Exam: General: Alert and awake, oriented x3, not in any acute distress, HEENT: anicteric sclera, pupils reactive to light and accommodation, EOMI CVS: S1-S2 clear, no murmur rubs or gallops Chest: clear to auscultation bilaterally, no wheezing, rales or rhonchi Abdomen: soft nontender, nondistended, normal bowel sounds, no organomegaly Extremities: no cyanosis, clubbing or edema noted bilaterally   Lab Results: Basic Metabolic Panel:  Lab 05/07/12 8119 05/06/12 0605  NA 128* 128*  K 3.2* 3.6  CL 97 94*  CO2 25 25  GLUCOSE 96 96  BUN 6 5*  CREATININE 0.46* 0.39*  CALCIUM 8.5 9.0  MG -- --  PHOS -- --   Liver Function Tests:  Lab 05/04/12 0510  AST 28  ALT 18  ALKPHOS 78  BILITOT 0.8  PROT 5.7*  ALBUMIN 2.4*   CBC:  Lab 05/07/12 0547 05/06/12 0605  WBC 3.6* 5.2  NEUTROABS -- --  HGB 8.8* 10.0*  HCT 25.9* 29.6*  MCV 94.5 94.6  PLT 279 303      Studies/Results: Dg Chest 2 View  05/03/2012  *RADIOLOGY REPORT*  Clinical Data: Weakness, dyspnea  CHEST - 2 VIEW  Comparison: 05/26/2010  Findings:  Grossly unchanged cardiac silhouette and mediastinal contours. Atherosclerotic calcifications within the thoracic aorta.  The lungs remain hyperexpanded with flattening of the bilateral hemidiaphragms.  There is mild diffuse thickening of the pulmonary interstitium.  No focal airspace  opacity.  No pleural effusion or pneumothorax.  Grossly unchanged bones.  IMPRESSION: Hyperexpanded lungs without acute cardiopulmonary disease.   Original Report Authenticated By: Waynard Reeds, M.D.     Medications: Scheduled Meds:    . amLODipine  10 mg Oral Daily  . cloNIDine  0.1 mg Oral QHS  . feeding supplement  237 mL Oral Q1500  . levothyroxine  25 mcg Oral Q breakfast  . pantoprazole (PROTONIX) IV  40 mg Intravenous Q12H  . potassium chloride  40 mEq Oral Once  . rivaroxaban  10 mg Oral Q breakfast   Continuous Infusions:    . DISCONTD: sodium chloride 100 mL/hr at 05/07/12 0600     Assessment/Plan: Active Problems:  Hyponatremia: Improving possibly secondary to HCTZ, dehydration, improving  - Continue current management, saline lock, she is euvolemic and tolerating diet   GI bleed: H&H slightly down likely sec to hemodilution - EGD on 10/10: few erosions at the GE junction with minimal spots of fresh red blood (per Dr. Arlyce Dice) - Continue PPI -  restarted xarelto for another 10 days  S/P TKA: appreciate orthopedics recommendation, weight bearing as tolerated to right leg   Esophagitis: continue PPI   Diarrhea: Cdiff negative  DVT Prophylaxis: SCDs   Code Status:  Disposition: Discussed with patient and friend, DC to SNF in AM.   LOS: 4 days   Firman Petrow M.D. [facility]  05/07/2012, 2:59 PM Pager: [phone]  If 7PM-7AM, please contact night-coverage [email or web address] Password [email or web address]

## 2012-05-08 ENCOUNTER — Encounter (HOSPITAL_COMMUNITY): Payer: Self-pay | Admitting: Gastroenterology

## 2012-05-08 LAB — BASIC METABOLIC PANEL
BUN: 7 mg/dL (ref 6–23)
Calcium: 9.1 mg/dL (ref 8.4–10.5)
Creatinine, Ser: 0.44 mg/dL — ABNORMAL LOW (ref 0.50–1.10)
GFR calc Af Amer: >90 mL/min (ref >90)
GFR calc non Af Amer: >90 mL/min (ref >90)

## 2012-05-08 LAB — CBC
HCT: 26.9 % — ABNORMAL LOW (ref 36.0–46.0)
MCH: 31.8 pg (ref 26.0–34.0)
MCHC: 33.8 g/dL (ref 30.0–36.0)
MCV: 94.1 fL (ref 78.0–100.0)
Platelets: 291 10*3/uL (ref 150–400)
RDW: 17.1 % — ABNORMAL HIGH (ref 11.5–15.5)
WBC: 4.1 10*3/uL (ref 4.0–10.5)

## 2012-05-08 MED ORDER — CLONIDINE HCL 0.1 MG PO TABS
0.1000 mg | ORAL_TABLET | Freq: Two times a day (BID) | ORAL | Status: DC
  Administered 2012-05-08: 0.1 mg via ORAL
  Filled 2012-05-08 (×2): qty 1

## 2012-05-08 MED ORDER — AMLODIPINE BESYLATE 10 MG PO TABS: 10.0000 mg | ORAL_TABLET | Freq: Every day | ORAL | Status: DC

## 2012-05-08 MED ORDER — ENSURE COMPLETE PO LIQD: 237.0000 mL | Freq: Every day | ORAL | Status: DC

## 2012-05-08 MED ORDER — RIVAROXABAN 10 MG PO TABS: 10.0000 mg | ORAL_TABLET | Freq: Every day | ORAL | Status: DC

## 2012-05-08 MED ORDER — OXYCODONE HCL 5 MG PO TABS: 5.0000 mg | ORAL_TABLET | ORAL | Status: DC | PRN

## 2012-05-08 MED ORDER — CLONIDINE HCL 0.1 MG PO TABS: 0.1000 mg | ORAL_TABLET | Freq: Two times a day (BID) | ORAL | Status: DC

## 2012-05-08 MED ORDER — OMEPRAZOLE 20 MG PO CPDR: 20.0000 mg | DELAYED_RELEASE_CAPSULE | Freq: Two times a day (BID) | ORAL | Status: DC

## 2012-05-08 NOTE — Progress Notes (Signed)
Per MD, Pt ready for d/c.  Notified RN, Pt, family and facility.  Facility requesting that Pt's family sign her in this morning at 10:30.  Family to sign Pt in at 10:30.  Sent d/c summary.  Confirmed receipt of d/c summary.  Facility ready to receive Pt.  Arranged for transportation.  Pt to be d/c'd.  Providence Crosby, LCSWA Clinical Social Work [phone]

## 2012-05-08 NOTE — Plan of Care (Signed)
Gave report to Jasmine December, RN at [facility] and left call back # if she had any more questions.

## 2012-05-08 NOTE — Progress Notes (Signed)
Physical Therapy Treatment Patient Details Name: [name] [name] MRN: [id number] DOB: 08-Dec-1930 Today's Date: 05/08/2012 Time: 0900-0930 PT Time Calculation (min): 30 min  PT Assessment / Plan / Recommendation Comments on Treatment Session  Continuing to improve. Plan is for d/c back to rehab today.     Follow Up Recommendations  Post acute inpatient     Does the patient have the potential to tolerate intense rehabilitation  No, Recommend SNF  Barriers to Discharge        Equipment Recommendations  None recommended by PT    Recommendations for Other Services    Frequency Min 5X/week   Plan Discharge plan remains appropriate    Precautions / Restrictions Precautions Precautions: Knee Restrictions Weight Bearing Restrictions: No RLE Weight Bearing: Weight bearing as tolerated   Pertinent Vitals/Pain 8/10 R knee, back(chronic)    Mobility  Bed Mobility Bed Mobility: Supine to Sit;Sit to Supine Supine to Sit: 4: Min guard Sit to Supine: 4: Min assist Details for Bed Mobility Assistance: Assist for R LE onto bed. Discuess some techniques (hooking R leg with L foot, using sheet/leg lifter) until R LE gets stronger.  Transfers Transfers: Stand to Sit;Sit to Stand Sit to Stand: 4: Min assist;From bed;With upper extremity assist Stand to Sit: 4: Min guard;With upper extremity assist;To bed Details for Transfer Assistance: VCs safety, technique, hand placement. Assist to rise, stabilize.  Ambulation/Gait Ambulation/Gait Assistance: 4: Min assist Ambulation Distance (Feet): 65 Feet Assistive device: Rolling walker Ambulation/Gait Assistance Details: VCs safety. 1 instance of instability during turn/chang in direction. Slow gait speed but good control for most of distance.  Gait Pattern: Antalgic;Decreased stride length;Decreased step length - left;Decreased step length - right;Step-to pattern    Exercises Total Joint Exercises Ankle Circles/Pumps: AROM;Both;15 reps;Supine Quad  Sets: AROM;Both;15 reps Short Arc Quad: AROM;Right;15 reps;Supine Heel Slides: AROM;Right;15 reps;Supine Hip ABduction/ADduction: AROM;Right;15 reps;Supine Straight Leg Raises: AROM;Right;15 reps;Supine   PT Diagnosis:    PT Problem List:   PT Treatment Interventions:     PT Goals Acute Rehab PT Goals Pt will go Supine/Side to Sit: with modified independence PT Goal: Supine/Side to Sit - Progress: Progressing toward goal Pt will go Sit to Supine/Side: with modified independence PT Goal: Sit to Supine/Side - Progress: Progressing toward goal Pt will go Sit to Stand: with supervision PT Goal: Sit to Stand - Progress: Progressing toward goal Pt will go Stand to Sit: with supervision PT Goal: Stand to Sit - Progress: Progressing toward goal Pt will Ambulate: 51 - 150 feet;with supervision;with least restrictive assistive device PT Goal: Ambulate - Progress: Progressing toward goal  Visit Information  Last PT Received On: 05/08/12 Assistance Needed: +1    Subjective Data  Subjective: " I didn't sleep well last night" Patient Stated Goal: Get back on track with therapy   Cognition  Overall Cognitive Status: Appears within functional limits for tasks assessed/performed Arousal/Alertness: Awake/alert Orientation Level: Appears intact for tasks assessed Behavior During Session: [facility] for tasks performed    Balance     End of Session PT - End of Session Activity Tolerance: Patient tolerated treatment well Patient left: in bed;with call bell/phone within reach   GP     [name] [facility] 05/08/2012, 9:36 AM [phone]

## 2012-05-08 NOTE — Discharge Summary (Addendum)
Physician Discharge Summary  Patient ID: [name] [name] MRN: [id number] DOB/AGE: 76-27-1932 76 y.o.  Admit date: 05/03/2012 Discharge date: 05/08/2012  Primary Care Physician:  Minda Meo, MD  Discharge Diagnoses:    .Hyponatremia .GI bleed .Anemia S/p total knee arthroplasty Hypertension  Hyponatremia: SIADH vs reset osmostat. Na 121 at adm, improved to 128 at DC, asymptomatic  Hypokalemia  Consults:  Gastroenterology, Dr Arlyce Dice                    Orthopedics: Dr Despina Hick  Discharge Medications:   Medication List     As of 05/08/2012 11:34 AM    STOP taking these medications         DSS 100 MG Caps      olmesartan-hydrochlorothiazide 20-12.5 MG per tablet   Commonly known as: BENICAR HCT      TAKE these medications         acetaminophen 325 MG tablet   Commonly known as: TYLENOL   Take 325-650 mg by mouth every 6 (six) hours as needed. pain      ALPRAZolam 0.25 MG tablet   Commonly known as: XANAX   Take 0.25 mg by mouth at bedtime as needed. Anxiety/sleep      amLODipine 10 MG tablet   Commonly known as: NORVASC   Take 1 tablet (10 mg total) by mouth daily.      bisacodyl 10 MG suppository   Commonly known as: DULCOLAX   Place 10 mg rectally daily as needed. diarrhea      cloNIDine 0.1 MG tablet   Commonly known as: CATAPRES   Take 1 tablet (0.1 mg total) by mouth 2 (two) times daily.      feeding supplement Liqd   Take 237 mLs by mouth daily at 3 pm.      ferrous sulfate 325 (65 FE) MG tablet   Take 325 mg by mouth daily with breakfast.      levothyroxine 25 MCG tablet   Commonly known as: SYNTHROID, LEVOTHROID   Take 25 mcg by mouth daily with breakfast. with full glass of water      methocarbamol 500 MG tablet   Commonly known as: ROBAXIN   Take 500 mg by mouth every 6 (six) hours as needed. Muscle spasms      omeprazole 20 MG capsule   Commonly known as: PRILOSEC   Take 1 capsule (20 mg total) by mouth 2 (two) times daily.     ondansetron 4 MG tablet   Commonly known as: ZOFRAN   Take 1 tablet (4 mg total) by mouth every 6 (six) hours as needed for nausea.      oxyCODONE 5 MG immediate release tablet   Commonly known as: Oxy IR/ROXICODONE   Take 1-2 tablets (5-10 mg total) by mouth every 4 (four) hours as needed for pain.      rivaroxaban 10 MG Tabs tablet   Commonly known as: XARELTO   Take 1 tablet (10 mg total) by mouth daily with breakfast. Take Xarelto till 05/15/12, then discontinue Xarelto.  Once the patient has completed the Xarelto, they may resume the 81 mg Aspirin.      traMADol 50 MG tablet   Commonly known as: ULTRAM   Take 1-2 tablets (50-100 mg total) by mouth every 6 (six) hours as needed (mild pain).         Brief H and P: For complete details please refer to admission H and P, but in brief 78 y/o  female who recently had total knee replacement on 04/24/12, was brought to the hospital after she was found to have low hemoglobin of 5.8 at the SNF. Patient received two units PRBC after surgery and the Hb had raised to 9.9. However, in the ED, she was found to have hb of 7.0, with black colored stools and heme positive stools. Patient complained of generalized weakness, no chest pain, no shortness of breath, no nausea, vomiting or diarrhea. She was admitted for further work-up.   Hospital Course:  Patient is a 78yo female admitted with anemia, possibly GI bleed and hyponatremia.     Hyponatremia: Na 121 at the time of admission. Improving possibly secondary to HCTZ, dehydration. Patient was placed on IV fluids and HCTZ was held. Urine osmolarity was elevated at 285, with serum osmolality 260, possibly SIADH or reset osmostat. She is euvolemic and tolerating diet, asymptomatic. Na is 128 at discharge.   GI bleed/ esophagitis: Hemoglobin is 9.1 at DC. GI was consulted and patient underwent EGD on 10/10 which showed few erosions at the GE junction with minimal spots of fresh red blood (per Dr.  Arlyce Dice). Continue PPI. Xarelto was resumed once okayed by both GI and orthopedics for total of 21 days post-op, patient will finish xarelto on 05/15/12. She can be thereafter resumed on Aspirin 81mg  daily.    S/P TKA: appreciate orthopedics recommendation, weight bearing as tolerated to right leg. Patient needs follow-up appointment this week or tomorrow with Dr Lequita Halt.   Diarrhea: Cdiff negative   Day of Discharge BP 149/70  Pulse 67  Temp 98.7 F (37.1 C) (Oral)  Resp 18  Ht 5\' 7"  (1.702 m)  Wt 66.225 kg (146 lb)  BMI 22.87 kg/m2  SpO2 97%  Physical Exam: General: Alert and awake oriented x3 not in any acute distress. HEENT: anicteric sclera, pupils reactive to light and accommodation CVS: S1-S2 clear no murmur rubs or gallops Chest: clear to auscultation bilaterally, no wheezing rales or rhonchi Abdomen: soft nontender, nondistended, normal bowel sounds, no organomegaly Extremities: no cyanosis, clubbing or edema noted bilaterally Neuro: Cranial nerves II-XII intact, no focal neurological deficits   The results of significant diagnostics from this hospitalization (including imaging, microbiology, ancillary and laboratory) are listed below for reference.    LAB RESULTS: Basic Metabolic Panel:  Lab 05/08/12 1610 05/07/12 0547  NA 128* 128*  K 4.2 3.2*  CL 95* 97  CO2 26 25  GLUCOSE 76 96  BUN 7 6  CREATININE 0.44* 0.46*  CALCIUM 9.1 8.5  MG -- --  PHOS -- --   Liver Function Tests:  Lab 05/04/12 0510  AST 28  ALT 18  ALKPHOS 78  BILITOT 0.8  PROT 5.7*  ALBUMIN 2.4*   CBC:  Lab 05/08/12 0435 05/07/12 0547  WBC 4.1 3.6*  NEUTROABS -- --  HGB 9.1* 8.8*  HCT 26.9* 25.9*  MCV 94.1 --  PLT 291 279    Significant Diagnostic Studies:  Dg Chest 2 View  05/03/2012  *RADIOLOGY REPORT*  Clinical Data: Weakness, dyspnea  CHEST - 2 VIEW  Comparison: 05/26/2010  Findings:  Grossly unchanged cardiac silhouette and mediastinal contours. Atherosclerotic  calcifications within the thoracic aorta.  The lungs remain hyperexpanded with flattening of the bilateral hemidiaphragms.  There is mild diffuse thickening of the pulmonary interstitium.  No focal airspace opacity.  No pleural effusion or pneumothorax.  Grossly unchanged bones.  IMPRESSION: Hyperexpanded lungs without acute cardiopulmonary disease.   Original Report Authenticated By: Waynard Reeds,  M.D.      Disposition and Follow-up:     Discharge Orders    Future Orders Please Complete By Expires   Diet general      Increase activity slowly          DISPOSITION: Skilled nursing facility  DIET: Regular diet  ACTIVITY: per physical therapy  TESTS THAT NEED FOLLOW-UP BMET, CBC in 1 week  Code status: Full Code   DISCHARGE FOLLOW-UP Follow-up Information    Follow up with Loanne Drilling, MD. Schedule an appointment as soon as possible for a visit on 05/09/2012. (Please have [facility] assist the patient in setting up her follow up visit and halp arrange transportation for the patient.)    Contact information:   [address], [address] [place] [address] [phone]       Follow up with ARONSON,RICHARD A, MD. Schedule an appointment as soon as possible for a visit in 10 days. (for hospital follow-up)    Contact information:   [address] [facility] [place] [address] [phone]          Time spent on Discharge: 50 mins  Signed:   Emberlin Verner M.D. [facility] 05/08/2012, 11:34 AM Pager: [phone]    Addendum: Anemia is likely secondary to acute blood loss anemia from GI bleed.  Alijah Akram M.D. 05/09/12 6:57PM

## 2012-12-22 ENCOUNTER — Ambulatory Visit (INDEPENDENT_AMBULATORY_CARE_PROVIDER_SITE_OTHER): Payer: Medicare Other | Admitting: Cardiology

## 2012-12-22 VITALS — BP 148/70 | Ht 65.0 in | Wt 157.0 lb

## 2012-12-22 DIAGNOSIS — I351 Nonrheumatic aortic (valve) insufficiency: Secondary | ICD-10-CM

## 2012-12-22 DIAGNOSIS — R5383 Other fatigue: Secondary | ICD-10-CM

## 2012-12-22 DIAGNOSIS — R0609 Other forms of dyspnea: Secondary | ICD-10-CM

## 2012-12-22 DIAGNOSIS — R5381 Other malaise: Secondary | ICD-10-CM

## 2012-12-22 DIAGNOSIS — I359 Nonrheumatic aortic valve disorder, unspecified: Secondary | ICD-10-CM

## 2012-12-22 DIAGNOSIS — R0989 Other specified symptoms and signs involving the circulatory and respiratory systems: Secondary | ICD-10-CM

## 2012-12-22 NOTE — Patient Instructions (Addendum)
We discussed, I think probably her fatigue and shortness of breath is probably related to your blood count being low and consented to being out of shape as you feel. You do have a murmur and you do have some respecter for heart disease and therefore it is not unreasonable to do some investigation of your heart.  Recheck an echocardiogram which is a heart ultrasound and will also check a special stress test which will have a ride a bicycle while wearing a mask that'll help measure the oxygen you breathe in and the carbon dioxide you breathe out to calculate your heart function.  I will see you back after the studies are done.

## 2012-12-22 NOTE — Progress Notes (Signed)
Patient ID: [name] [name], female   DOB: May 06, 1931, 79 y.o.   MRN: [id number]  Clinic Note: HPI: [name] [name] is a 80 y.o. female with a PMH below who presents today for consultation for exertional dyspnea. She is referred by Dr. Jacky [name].  Interval History: [name] [name] was a relatively healthy 79 year old woman with no real major medical problems other than arthritic pains and hypertension hyperlipidemia who underwent knee surgery with a right total knee replacement in September 2013. There is quite a complications following this related to her being placed on Xarelto. She end up having some type of bleed where she is quite anemic with a hemoglobin of 5. It slowly climbing up but still being evaluated. Last check was still not back to goal. She also has hypothyroidism has been diagnosed relatively recently. She is being referred for dyspnea and decreased exercise tolerance.   She denies any symptoms and a chest pain or shortness of breath at rest and denies any chest pain with exertion. She does a little wind did now since her surgery, and anemia, but also attributed to being out of shape. Prior to her operation she was doing relatively well up until the knees are bothering her for years ago. She denies any PND, orthopnea or edema. She denies any palpitations or syncope/near-syncope. No lightheadedness or dizziness or wooziness. She denies any TIA or amaurosis fugax symptoms. She also denies any melena, hematochezia or hematuria.  She does not feel it is nothing wrong with her heart, but doesn't balance as she does have hypertension and that there may be a risk. Last check of her cholesterol was just this month were total cholesterol 180 HDL 63 LDL 111 triglycerides were 77. This is not on statin, so she is close to goal for someone with a greater risk factors.  Past Medical History  Diagnosis Date  . DDD (degenerative disc disease)   . Osteopenia   . GERD (gastroesophageal reflux disease)   .  Hypertension   . Hyperlipidemia   . Vertigo, benign positional   . Hypothyroidism   . Anxiety   . H/O hiatal hernia   . Anemia   . Eczema   . Septicemia 2002    following UTI    Prior cardiac evaluation and past surgical history: Past Surgical History  Procedure Laterality Date  . Dilation and curettage of uterus      x 2  . Breast surgery      left biopsy  . Knee arthroscopy  2011    right  . Tonsillectomy    . Eye surgery      cataract extraction with ILO  ? eye  . Total knee arthroplasty  04/24/2012    Procedure: TOTAL KNEE ARTHROPLASTY;  Surgeon: [name] Drilling, MD;  Location: [facility];  Service: Orthopedics;  Laterality: Right;  . Esophagogastroduodenoscopy  05/04/2012    Procedure: ESOPHAGOGASTRODUODENOSCOPY (EGD);  Surgeon: [name] Meckel, MD;  Location: [facility];  Service: Endoscopy;  Laterality: N/A;    No Known Allergies  Current Outpatient Prescriptions  Medication Sig Dispense Refill  . acetaminophen (TYLENOL) 325 MG tablet Take 325-650 mg by mouth every 6 (six) hours as needed. pain      . ALPRAZolam (XANAX) 0.25 MG tablet Take 0.25 mg by mouth at bedtime as needed. Anxiety/sleep      . amLODipine (NORVASC) 10 MG tablet Take 1 tablet (10 mg total) by mouth daily.      Marland Kitchen aspirin EC 81 MG tablet Take  81 mg by mouth daily.      . cloNIDine (CATAPRES) 0.1 MG tablet Take 0.1 mg by mouth daily.      . ferrous sulfate 325 (65 FE) MG tablet Take 325 mg by mouth daily with breakfast.      . levothyroxine (SYNTHROID, LEVOTHROID) 50 MCG tablet Take 50 mcg by mouth daily before breakfast.      . losartan (COZAAR) 25 MG tablet Take 25 mg by mouth daily.      Marland Kitchen omeprazole (PRILOSEC) 20 MG capsule Take 1 capsule (20 mg total) by mouth 2 (two) times daily.      . raloxifene (EVISTA) 60 MG tablet Take 60 mg by mouth daily.       No current facility-administered medications for this visit.   No family history on file.  History   Social History  . Marital Status:  Widowed    Spouse Name: N/A    Number of Children: N/A  . Years of Education: N/A   Occupational History  . Not on file.   Social History Main Topics  . Smoking status: Former Smoker -- 0 years    Quit date: 05/03/1965  . Smokeless tobacco: Never Used  . Alcohol Use: Yes     Comment: occ glass wine  . Drug Use: No  . Sexually Active: Not on file   Other Topics Concern  . Not on file   Social History Narrative  . No narrative on file   ROS: A comprehensive Review of Systems - Negative except Pertinent positives above and below General ROS: positive for  - fatigue Musculoskeletal ROS: positive for - pain in knee - right  PHYSICAL EXAM BP 148/70  Ht 5\' 5"  (1.651 m)  Wt 157 lb (71.215 kg)  BMI 26.13 kg/m2 General appearance: alert, cooperative, appears older than stated age, no distress and Pleasant mood and affect Neck: no adenopathy, no carotid bruit, no JVD, supple, symmetrical, trachea midline, thyroid not enlarged, symmetric, no tenderness/mass/nodules and HEENT: [place]/AT, EOMI, MMM, anicteric sclera. Lungs: clear to auscultation bilaterally, normal percussion bilaterally and Nonlabored Heart: regular rate and rhythm, S1, S2 normal, 1-2/6 crescendo-decrescendo systolic ejection murmur at the right upper sternal border, click, rub or gallop and normal apical impulse Abdomen: soft, non-tender; bowel sounds normal; no masses,  no organomegaly and No abdominal or femoral bruit Extremities: extremities normal, atraumatic, no cyanosis or edema Pulses: 2+ and symmetric Skin: Skin color, texture, turgor normal. No rashes or lesions Neurologic: Mental status: Alert, oriented, thought content appropriate, affect: normal and Pleasant Cranial nerves: normal Otherwise unremarkable  [id number] today: Yes Rate: 66 , Rhythm: Normal Sinus;  otherwise normal  ASSESSMENT: 79 year old woman with several months of fatigue and mild dyspnea on exertion in the setting of postoperative repeat  anemia following knee surgery. My overall impression is that her symptoms are probably still do to a combination of anemia and positive hypothyroidism. However she does have cardiac risk factors and are to best assess her functional status I think the best test would be to do a cardiopulmonary exercise test (CPET-MET) with PFTs. We'll also do an echocardiogram to evaluate the aortic murmur, and to ensure there is no structural abnormalities or diastolic dysfunction which could be somewhat contributing to her symptoms.  Aortic ejection murmur - Plan: EKG 12-Lead, 2D Echocardiogram without contrast, CPET WITH PFT (MET TEST)  Exertional dyspnea - Plan: EKG 12-Lead, 2D Echocardiogram without contrast, CPET WITH PFT (MET TEST)  Fatigue with decreased exercise tolerance   PLAN:  Per problem list. No orders of the defined types were placed in this encounter.   Meds ordered this encounter  Medications  . levothyroxine (SYNTHROID, LEVOTHROID) 50 MCG tablet    Sig: Take 50 mcg by mouth daily before breakfast.  . losartan (COZAAR) 25 MG tablet    Sig: Take 25 mg by mouth daily.  . cloNIDine (CATAPRES) 0.1 MG tablet    Sig: Take 0.1 mg by mouth daily.  . raloxifene (EVISTA) 60 MG tablet    Sig: Take 60 mg by mouth daily.  Marland Kitchen aspirin EC 81 MG tablet    Sig: Take 81 mg by mouth daily.    Followup: After completion of CPET-MET and Echocardiogram  HARDING,DAVID W, M.D., M.S. [facility] [address] [place], [place]  [address]  [phone] Pager # [phone] 12/22/2012 3:55 PM

## 2012-12-24 DIAGNOSIS — I4589 Other specified conduction disorders: Secondary | ICD-10-CM

## 2012-12-24 HISTORY — DX: Other specified conduction disorders: I45.89

## 2012-12-25 ENCOUNTER — Encounter: Payer: Self-pay | Admitting: Cardiology

## 2012-12-25 DIAGNOSIS — R5383 Other fatigue: Secondary | ICD-10-CM | POA: Insufficient documentation

## 2012-12-25 NOTE — Assessment & Plan Note (Signed)
Evaluate with CPET-MET tests and echocardiogram.

## 2012-12-25 NOTE — Assessment & Plan Note (Signed)
Based on the exam, this is likely a benign aortic sclerosis murmur, however like to investigate to ensure there is not any evidence of stenosis. It could also be related to anemia, being a flow murmur.

## 2012-12-25 NOTE — Assessment & Plan Note (Signed)
Deferred to primary physician to monitor anemia and hypothyroidism. CPET-MET test will also evaluate exercise tolerance

## 2013-01-10 ENCOUNTER — Ambulatory Visit (HOSPITAL_COMMUNITY)
Admission: RE | Admit: 2013-01-10 | Discharge: 2013-01-10 | Disposition: A | Discharge status: Home / Self Care | Payer: Medicare Other | Source: Ambulatory Visit | Attending: Cardiology | Admitting: Cardiology

## 2013-01-10 DIAGNOSIS — I10 Essential (primary) hypertension: Secondary | ICD-10-CM | POA: Insufficient documentation

## 2013-01-10 DIAGNOSIS — R0609 Other forms of dyspnea: Secondary | ICD-10-CM

## 2013-01-10 DIAGNOSIS — R011 Cardiac murmur, unspecified: Secondary | ICD-10-CM | POA: Insufficient documentation

## 2013-01-10 DIAGNOSIS — I351 Nonrheumatic aortic (valve) insufficiency: Secondary | ICD-10-CM

## 2013-01-10 DIAGNOSIS — E785 Hyperlipidemia, unspecified: Secondary | ICD-10-CM | POA: Insufficient documentation

## 2013-01-10 DIAGNOSIS — E039 Hypothyroidism, unspecified: Secondary | ICD-10-CM | POA: Insufficient documentation

## 2013-01-10 HISTORY — PX: DOPPLER ECHOCARDIOGRAPHY: SHX263

## 2013-01-10 NOTE — Progress Notes (Signed)
2D Echo Performed 01/10/2013    Jevante Hollibaugh, RCS  

## 2013-01-15 ENCOUNTER — Encounter (INDEPENDENT_AMBULATORY_CARE_PROVIDER_SITE_OTHER): Payer: Medicare Other

## 2013-01-15 DIAGNOSIS — R0609 Other forms of dyspnea: Secondary | ICD-10-CM

## 2013-01-15 DIAGNOSIS — I351 Nonrheumatic aortic (valve) insufficiency: Secondary | ICD-10-CM

## 2013-01-15 DIAGNOSIS — R0602 Shortness of breath: Secondary | ICD-10-CM

## 2013-01-18 ENCOUNTER — Telehealth: Payer: Self-pay | Admitting: *Deleted

## 2013-01-18 ENCOUNTER — Telehealth: Payer: Self-pay | Admitting: Cardiology

## 2013-01-18 NOTE — Telephone Encounter (Signed)
Message copied by Tobin Chad on Thu Jan 18, 2013 10:59 AM ------      Message from: [facility], DAVID      Created: Wed Jan 10, 2013 10:48 PM       Pretty normal Echo - no structural abnormalities.      No valve lesions.      Normal function.            Marykay Lex, MD       ------

## 2013-01-18 NOTE — Telephone Encounter (Signed)
Spoke to patient. Results given. She states she was not able to receive results from My chart. She has an appointment on 01/23/13.

## 2013-01-18 NOTE — Telephone Encounter (Signed)
Returning your call-concerning her test results. °

## 2013-01-18 NOTE — Telephone Encounter (Signed)
Left message that results will be released into her My Chart.Any question can call the office.

## 2013-01-23 ENCOUNTER — Encounter: Payer: Self-pay | Admitting: Cardiology

## 2013-01-23 ENCOUNTER — Ambulatory Visit (INDEPENDENT_AMBULATORY_CARE_PROVIDER_SITE_OTHER): Payer: Medicare Other | Admitting: Cardiology

## 2013-01-23 VITALS — BP 120/56 | HR 53 | Ht 65.5 in | Wt 157.8 lb

## 2013-01-23 DIAGNOSIS — R0609 Other forms of dyspnea: Secondary | ICD-10-CM

## 2013-01-23 DIAGNOSIS — I359 Nonrheumatic aortic valve disorder, unspecified: Secondary | ICD-10-CM

## 2013-01-23 DIAGNOSIS — R5383 Other fatigue: Secondary | ICD-10-CM

## 2013-01-23 DIAGNOSIS — I4589 Other specified conduction disorders: Secondary | ICD-10-CM

## 2013-01-23 DIAGNOSIS — R079 Chest pain, unspecified: Secondary | ICD-10-CM

## 2013-01-23 DIAGNOSIS — I351 Nonrheumatic aortic (valve) insufficiency: Secondary | ICD-10-CM

## 2013-01-23 DIAGNOSIS — I10 Essential (primary) hypertension: Secondary | ICD-10-CM

## 2013-01-23 DIAGNOSIS — R0989 Other specified symptoms and signs involving the circulatory and respiratory systems: Secondary | ICD-10-CM

## 2013-01-23 DIAGNOSIS — R5381 Other malaise: Secondary | ICD-10-CM

## 2013-01-23 NOTE — Patient Instructions (Addendum)
Your physician wants you to follow-up  IN 4 MONTHS.  You will receive a reminder letter in the mail two months in advance. If you don't receive a letter, please call our office to schedule the follow-up appointment.   Your physician has recommended you make the following change in your medication take your Clonidine every other day  For 2 weeks the stop,only take Clonidine if blood pressure goes above 170's.

## 2013-01-29 ENCOUNTER — Encounter: Payer: Self-pay | Admitting: Cardiology

## 2013-01-29 DIAGNOSIS — I10 Essential (primary) hypertension: Secondary | ICD-10-CM | POA: Insufficient documentation

## 2013-01-29 NOTE — Assessment & Plan Note (Addendum)
DC clonidine and avoid other AV nodal agents. May need to increase losartan if blood pressure increases.  I gave her the CPET / MET Test exercise prescription. Recommend access frequent 2-3 times per week increasing to 3 [place] per week. Intensity should be heart rate from 80-90 beats a minute. This should be for 30 minutes per session increasing to 40 minutes per session. Low impact activities such as walking, cycling, water aerobics preferred.

## 2013-01-29 NOTE — Assessment & Plan Note (Signed)
Aortic sclerosis on echo explains this.

## 2013-01-29 NOTE — Assessment & Plan Note (Signed)
Atypical symptoms, not noted during CPET. Unlikely anginal in nature.

## 2013-01-29 NOTE — Assessment & Plan Note (Signed)
Lead to monitor her blood pressure with clonidine being held. I said that she should use the clonidine as an as-needed basis for blood pressure greater than 170 mmHg. Anticipate we may need increase in ARB dose.

## 2013-01-29 NOTE — Assessment & Plan Note (Signed)
Normal echocardiogram with evidence of decreased peak VO2 of 76% of likely due to submaximal effort and Chronotropic Incompetence.

## 2013-01-29 NOTE — Progress Notes (Signed)
Patient ID: [name] [name], female   DOB: 02-03-1931, 79 y.o.   MRN: [id number]  Clinic Note: HPI: [name] [name] is a 79 y.o. female with a PMH below, referred by Dr. Jacky Kindle for evaluation of dyspnea on exertion.  She notes several months of fatigue and mild dyspnea on exertion in the setting of postoperative repeat anemia following knee surgery. My overall impression was that her symptoms are probably still do to a combination of anemia and positive hypothyroidism. However she does have cardiac risk factors and are to best assess her functional status, so I evaluated her with an echocardiogram and a cardiopulmonary exercise test (CPET-MET) with PFTs. She now returns for followup.  Interval History: She still notes her baseline dyspnea and fatigue especially with exertion. He denies any chest pain with rest or exertion. However following her CPET test she feels like this may be mostly due to being out of shape/Deconditioned. She  continues to deny PND, orthopnea or edema. She denies any palpitations or syncope/near-syncope. No lightheadedness or dizziness or wooziness. She denies any TIA or amaurosis fugax symptoms. She also denies any melena, hematochezia or hematuria.  Past Medical History  Diagnosis Date  . DDD (degenerative disc disease)   . Osteopenia   . GERD (gastroesophageal reflux disease)   . Hypertension   . Hyperlipidemia   . Vertigo, benign positional   . Hypothyroidism   . Anxiety   . H/O hiatal hernia   . Anemia   . Eczema   . Septicemia 2002    following UTI    Prior Cardiac Evaluation and Past Surgical History: Past Surgical History  Procedure Laterality Date  . Dilation and curettage of uterus      x 2  . Breast surgery      left biopsy  . Knee arthroscopy  2011    right  . Tonsillectomy    . Eye surgery      cataract extraction with ILO  ? eye  . Total knee arthroplasty  04/24/2012    Procedure: TOTAL KNEE ARTHROPLASTY;  Surgeon: Loanne Drilling, MD;  Location: [facility];  Service: Orthopedics;  Laterality: Right;  . Esophagogastroduodenoscopy  05/04/2012    Procedure: ESOPHAGOGASTRODUODENOSCOPY (EGD);  Surgeon: Louis Meckel, MD;  Location: [facility];  Service: Endoscopy;  Laterality: N/A;    CPET / MET: Submaximal effort of 1.01 with target greater than 1.09; cardiac incompetence, it peak heart rate 100 beats a minute (71%), peak VO2 11 (76%) moderately reduced; anaerobic threshold low at 7.3 cold being greater than 8.2 uric normal pulmonary the VE-VCO2 slope and Breathing Reserve.  PFTs: FVC - 2.38 (85% predicted, normal); FEV1 - 1.83 (88%, normal), FEV1/FVC - 76.9 (normal),  MVV - 56.7 (60%, low)  Exercise limiting factor: Chronotropic incompetence (medication, effort)  No Known Allergies  Current Outpatient Prescriptions  Medication Sig Dispense Refill  . acetaminophen (TYLENOL) 325 MG tablet Take 325-650 mg by mouth every 6 (six) hours as needed. pain      . ALPRAZolam (XANAX) 0.25 MG tablet Take 0.25 mg by mouth at bedtime as needed. Anxiety/sleep      . amLODipine (NORVASC) 10 MG tablet Take 1 tablet (10 mg total) by mouth daily.      Marland Kitchen aspirin EC 81 MG tablet Take 81 mg by mouth daily.      . calcium carbonate (OS-CAL) 600 MG TABS Take 600 mg by mouth 2 (two) times daily with a meal.      . cholecalciferol (  VITAMIN D) 1000 UNITS tablet Take 1,000 Units by mouth daily.      . cloNIDine (CATAPRES) 0.1 MG tablet Take 0.1 mg by mouth daily.      . ferrous sulfate 325 (65 FE) MG tablet Take 325 mg by mouth daily with breakfast.      . fish oil-omega-3 fatty acids 1000 MG capsule Take 1 g by mouth daily.      Marland Kitchen levothyroxine (SYNTHROID, LEVOTHROID) 50 MCG tablet Take 50 mcg by mouth daily before breakfast.      . losartan (COZAAR) 25 MG tablet Take 25 mg by mouth daily.      . Multiple Vitamins-Minerals (MULTIVITAMIN PO) Take 1 tablet by mouth daily.      Marland Kitchen omeprazole (PRILOSEC) 20 MG capsule Take 1 capsule (20 mg total) by mouth 2 (two) times  daily.      . raloxifene (EVISTA) 60 MG tablet Take 60 mg by mouth daily.       No current facility-administered medications for this visit.   Family History  Problem Relation Age of Onset  . Lung cancer Father 79    Died at age 9  . Stroke Mother 21    Died in her late 63s.  Marland Kitchen Heart attack Mother 38    History   Social History  . Marital Status: Widowed    Spouse Name: N/A    Number of Children: 2  . Years of Education: N/A   Occupational History  . Not on file.   Social History Main Topics  . Smoking status: Former Smoker -- 0 years    Types: Cigarettes    Quit date: 05/03/1965  . Smokeless tobacco: Never Used  . Alcohol Use: No     Comment: occ glass wine  . Drug Use: No  . Sexually Active: Not on file   Other Topics Concern  . Not on file   Social History Narrative  . No narrative on file   ROS: A comprehensive Review of Systems - Negative except Pertinent positives above and below General ROS: positive for  - fatigue Musculoskeletal ROS: positive for - pain in knee - right  PHYSICAL EXAM BP 120/56  Pulse 53  Ht 5' 5.5" (1.664 m)  Wt 157 lb 12.8 oz (71.578 kg)  BMI 25.85 kg/m2 General appearance: alert, cooperative, appears older than stated age, no distress and Pleasant mood and affect Neck: no adenopathy, no carotid bruit, no JVD, supple, symmetrical, trachea midline, thyroid not enlarged, HEENT: [place]/AT, EOMI, MMM, anicteric sclera. Lungs: CTA B., normal percussion bilaterally and Nonlabored Heart: RRR, S1, S2 normal, 1-2/6 crescendo-decrescendo systolic ejection murmur at the right upper sternal border, click, rub or gallop and normal apical impulse Abdomen: soft, NT/ND; bowel sounds normal; no masses,  no organomegaly and No abdominal or femoral bruit Extremities: extremities normal, atraumatic, no cyanosis or edema Pulses: 2+ and symmetric Skin: Skin color, texture, turgor normal. No rashes or lesions Neurologic: Mental status: Alert, oriented,  thought content appropriate, affect: normal and Pleasant Cranial nerves: normal  [id number] today: Yes Rate: 53 , Rhythm: Normal Sinus bradycardia;  otherwise normal; there is some inferior Q waves but this is probably due to the abnormally positioned.  Recent labs:Last month -- total cholesterol 180 HDL 63 LDL 111 triglycerides were 77. This is not on statin, so she is close to goal for someone with a greater risk factors.  ASSESSMENT / PLAN: Fatigue with decreased exercise tolerance Likely multifactorial. She is somewhat deconditioned, however her CPET test  suggested chronotropic incompetence - both medication and effort related -- only able to increase her heart rate up to 100 beats a minute which is 71% of predicted.  DC clonidine with a gradual wean off. Would avoid beta blockers.  Chronotropic incompetence DC clonidine and avoid other AV nodal agents. May need to increase losartan if blood pressure increases.  Exertional dyspnea Normal echocardiogram with evidence of decreased peak VO2 of 76% of likely due to submaximal effort and Chronotropic Incompetence.  Chest pain with low risk for cardiac etiology Atypical symptoms, not noted during CPET. Unlikely anginal in nature.  Aortic ejection murmur Aortic sclerosis on echo explains this.   Followup:  4 months to reassess symptoms  Niels Cranshaw W, M.D., M.S. [facility] [address] [place], [place]  [address]  [phone] Pager # [phone] 01/29/2013 1:29 AM

## 2013-01-29 NOTE — Assessment & Plan Note (Addendum)
Likely multifactorial. She is somewhat deconditioned, however her CPET test suggested chronotropic incompetence - both medication and effort related -- only able to increase her heart rate up to 100 beats a minute which is 71% of predicted.  DC clonidine with a gradual wean off. Would avoid beta blockers.

## 2013-02-28 ENCOUNTER — Other Ambulatory Visit: Payer: Self-pay

## 2013-05-08 ENCOUNTER — Encounter: Payer: Self-pay | Admitting: Cardiology

## 2013-05-08 ENCOUNTER — Ambulatory Visit (INDEPENDENT_AMBULATORY_CARE_PROVIDER_SITE_OTHER): Payer: Medicare Other | Admitting: Cardiology

## 2013-05-08 VITALS — BP 130/60 | HR 60 | Ht 65.5 in | Wt 160.4 lb

## 2013-05-08 DIAGNOSIS — I4589 Other specified conduction disorders: Secondary | ICD-10-CM

## 2013-05-08 DIAGNOSIS — R5383 Other fatigue: Secondary | ICD-10-CM

## 2013-05-08 DIAGNOSIS — I10 Essential (primary) hypertension: Secondary | ICD-10-CM

## 2013-05-08 DIAGNOSIS — R5381 Other malaise: Secondary | ICD-10-CM

## 2013-05-08 DIAGNOSIS — R0989 Other specified symptoms and signs involving the circulatory and respiratory systems: Secondary | ICD-10-CM

## 2013-05-08 DIAGNOSIS — R0609 Other forms of dyspnea: Secondary | ICD-10-CM

## 2013-05-08 NOTE — Patient Instructions (Signed)
Blood pressure seemed to do okay with the one half of the dosing of clonidine.  I would then stop the clonidine completely.  This may make her blood pressure go up somebody Dr. Jacky Kindle to check that next month.  Hopefully have this clonidine off will allow your heart to did increase more with activity.  That should help with your breathlessness.  This plus the added iron to help your blood counts should help.   I will see you back in February timeframe to see how you're doing then.  We can then decide whether or not we need to do more detailed evaluations of your heart. Marland Kitchen HARDING,DAVID W

## 2013-05-10 ENCOUNTER — Encounter: Payer: Self-pay | Admitting: Cardiology

## 2013-05-10 NOTE — Progress Notes (Signed)
PATIENT: [name] [name] MRN: [id number]  DOB: 09-21-1930   DOV:05/10/2013 PCP: Minda Meo, MD  Clinic Note: Chief Complaint  Patient presents with  . ROV 4 months    Shortness of breath much of the time-she has been told she is anemic, lightheadedness and "chronic vertigo"   HPI: [name] MORNEAULT is a 79 y.o. female with a PMH below who presents today for followup evaluation of the dyspnea.. She has been evaluated with a CPET test, that demonstrated chronotropic incompetence. This is thought to be potentially medication related. My intention was for her to fully stop her clonidine, but she's only cut the dose to once a day as opposed twice a day.  Interval History: She continues to note exertional dyspnea, was told that she was anemic with a hemoglobin of 9, and then made potentially has something to do with her dyspnea. Her echocardiogram also was not all that helpful. She's never noted any chest tightness or pressure with rest or exertion. No PND, orthopnea or edema. No palpitations or rapid heart beats. No lightheadedness, dizziness or syncope/near-syncope. No TIA or amaurosis fugax symptoms. No melena hematochezia or hematuria. No claudication.  She has started exercising doing some water aerobics. She is also now taking iron supplementation.  Past Medical History  Diagnosis Date  . DDD (degenerative disc disease)   . Osteopenia   . GERD (gastroesophageal reflux disease)   . Hypertension   . Hyperlipidemia   . Vertigo, benign positional   . Hypothyroidism   . Anxiety   . H/O hiatal hernia   . Anemia   . Eczema   . Septicemia 2002    following UTI  . Chronotropic incompetence 12/2012     potentially medication related; noted on CPET     Prior Cardiac Evaluation and Past Surgical History: Past Surgical History  Procedure Laterality Date  . Dilation and curettage of uterus      x 2  . Breast surgery      left biopsy  . Knee arthroscopy  2011    right  . Tonsillectomy      . Eye surgery      cataract extraction with ILO  ? eye  . Total knee arthroplasty  04/24/2012    Procedure: TOTAL KNEE ARTHROPLASTY;  Surgeon: Loanne Drilling, MD;  Location: [facility];  Service: Orthopedics;  Laterality: Right;  . Esophagogastroduodenoscopy  05/04/2012    Procedure: ESOPHAGOGASTRODUODENOSCOPY (EGD);  Surgeon: Louis Meckel, MD;  Location: [facility];  Service: Endoscopy;  Laterality: N/A;  . Doppler echocardiography  01/10/2013    Normal LV size and function. Normal EF. Air sclerosis but no stenosis  . Cpet / met - pfts      Consistent with chronotropic incompetence; See attached report in the results section    No Known Allergies  Current Outpatient Prescriptions  Medication Sig Dispense Refill  . acetaminophen (TYLENOL) 325 MG tablet Take 650 mg by mouth as needed for pain.      Marland Kitchen ALPRAZolam (XANAX) 0.25 MG tablet Take 0.25 mg by mouth at bedtime as needed. Anxiety/sleep      . amLODipine (NORVASC) 10 MG tablet Take 1 tablet (10 mg total) by mouth daily.      Marland Kitchen aspirin EC 81 MG tablet Take 81 mg by mouth daily.      . calcium carbonate (OS-CAL) 600 MG TABS Take 600 mg by mouth 2 (two) times daily with a meal.      . cholecalciferol (VITAMIN D)  1000 UNITS tablet Take 1,000 Units by mouth daily.      . cloNIDine (CATAPRES) 0.1 MG tablet Take 0.1 mg by mouth daily.      . diphenhydramine-acetaminophen (TYLENOL PM) 25-500 MG TABS Take 2 tablets by mouth at bedtime.      . ferrous sulfate 325 (65 FE) MG tablet Take 325 mg by mouth daily with breakfast.      . fish oil-omega-3 fatty acids 1000 MG capsule Take 1 g by mouth daily.      Marland Kitchen levothyroxine (SYNTHROID, LEVOTHROID) 50 MCG tablet Take 50 mcg by mouth daily before breakfast.      . losartan (COZAAR) 25 MG tablet Take 25 mg by mouth daily.      . Multiple Vitamins-Minerals (MULTIVITAMIN PO) Take 1 tablet by mouth daily.      Marland Kitchen omeprazole (PRILOSEC) 20 MG capsule Take 1 capsule (20 mg total) by mouth 2 (two) times  daily.      . raloxifene (EVISTA) 60 MG tablet Take 60 mg by mouth daily.       No current facility-administered medications for this visit.    History   Social History Narrative   She is a widowed mother of 2.   She is a former smoker, with a distant history having quit in 1966.   She does drink an occasional glass of red wine.   She is just now started to try doing exercise with water aerobics.    ROS: A comprehensive Review of Systems - Negative except Fatigue in the dyspnea on exertion as it appears she also has some right knee pain  PHYSICAL EXAM BP 130/60  Pulse 60  Ht 5' 5.5" (1.664 m)  Wt 160 lb 6.4 oz (72.757 kg)  BMI 26.28 kg/m2 General appearance: alert, cooperative, appears older than stated age, no distress and Pleasant mood and affect  Neck: no adenopathy, no carotid bruit, no JVD, supple, symmetrical, trachea midline, thyroid not enlarged,  HEENT: [place]/AT, EOMI, MMM, anicteric sclera.  Lungs: CTA B., normal percussion bilaterally and Nonlabored  Heart: RRR, S1, S2 normal, 1-2/6 crescendo-decrescendo systolic ejection murmur at the right upper sternal border, click, rub or gallop and normal apical impulse  Abdomen: soft, NT/ND; bowel sounds normal; no masses, no organomegaly and No abdominal or femoral bruit  Extremities: extremities normal, atraumatic, no cyanosis or edema  Pulses: 2+ and symmetric  Skin: Skin color, texture, turgor normal. No rashes or lesions  Neurologic: Mental status: Alert, oriented, thought content appropriate, affect: normal and Pleasant  Cranial nerves: normal  [id number] today: Yes Rate: 60 , Rhythm: Sinus rhythm, normal ECG;    Recent Labs: None  ASSESSMENT / PLAN: Fatigue with decreased exercise tolerance Again this is likely multifactorial. Including chronotropic incompetence, anemia, and deconditioning.  Plan: Completely DC clonidine, and avoid (AV nodal agents) beta blocker/non-dihydropyridine calcium channel  blockers  Reassess in 3-4 months  Exertional dyspnea Again multifactorial. PCO2 was 76%, probably due to submaximal effort and chronotropic competence. Normal echocardiogram. Partly attributed to anemia as well.  Plan: Reassess in 3-4 months, if she continues to be dyspneic despite being off of clonidine, and no longer anemic, would then consider Myoview stress test to evaluate for microvascular coronary ischemia.  Chronotropic incompetence Discontinue clonidine. Avoid AV nodal agents.  Hypertension Stable. Continue with current dose of ARB. Would tolerate mild increase in blood pressure, but may need to increase ARB dose.   Orders Placed This Encounter  Procedures  . EKG 12-Lead    Stop  Clonidine.  Followup: 3-4 months  Myrella Fahs W. Herbie Baltimore, M.D., M.S. [facility] [address] [place], [place]  [address]  [phone] Pager # [phone]

## 2013-05-10 NOTE — Assessment & Plan Note (Signed)
Again multifactorial. PCO2 was 76%, probably due to submaximal effort and chronotropic competence. Normal echocardiogram. Partly attributed to anemia as well.  Plan: Reassess in 3-4 months, if she continues to be dyspneic despite being off of clonidine, and no longer anemic, would then consider Myoview stress test to evaluate for microvascular coronary ischemia.

## 2013-05-10 NOTE — Assessment & Plan Note (Signed)
Discontinue clonidine. Avoid AV nodal agents.

## 2013-05-10 NOTE — Assessment & Plan Note (Signed)
Again this is likely multifactorial. Including chronotropic incompetence, anemia, and deconditioning.  Plan: Completely DC clonidine, and avoid (AV nodal agents) beta blocker/non-dihydropyridine calcium channel blockers  Reassess in 3-4 months

## 2013-05-10 NOTE — Assessment & Plan Note (Signed)
Stable. Continue with current dose of ARB. Would tolerate mild increase in blood pressure, but may need to increase ARB dose.

## 2013-05-15 ENCOUNTER — Telehealth: Payer: Self-pay | Admitting: Cardiology

## 2013-05-15 MED ORDER — LOSARTAN POTASSIUM 100 MG PO TABS: 100.0000 mg | ORAL_TABLET | Freq: Every day | ORAL | Status: DC

## 2013-05-15 NOTE — Telephone Encounter (Signed)
Returned call and informed pt per instructions by MD.  Pt stated she has been taking 50 mg.  Asked pt whose name is on Rx as our files reflect she is taking 25 mg since May 2014.  Pt stated it was rx'd by Dr. Jacky Kindle and she will be due for refill soon.  Pt informed RN will contact Dr. Lanell Matar office to confirm current dose and share with Dr. Herbie Baltimore for further instructions, then call her back.  Pt verbalized understanding and agreed w/ plan.  Call to Dr. Lanell Matar office [phone]) and informed nurses are out.  Transferred to Medical Records and informed med list will be faxed.  Awaiting receipt to review and notify Dr. Herbie Baltimore.

## 2013-05-15 NOTE — Telephone Encounter (Signed)
Has a question about dosage on losartin med  Please call

## 2013-05-15 NOTE — Telephone Encounter (Signed)
Returned call and informed pt per instructions by MD/PA.  Pt also advised to monitor for symptoms of low BP like fatigue, dizziness, blurred vision and call back.  Pt verbalized understanding and agreed w/ plan.  Pt will check BP a couple hours after taking med to make sure BP in normal range.  Rx sent to mail order pharmacy and pt will take two - 50 mg tabs daily until new Rx received.  RN faxed med list back to Dr. Lanell Matar office.

## 2013-05-15 NOTE — Telephone Encounter (Signed)
If she is on 50 mg - then increase to 100mg .  Marykay Lex, MD

## 2013-05-15 NOTE — Telephone Encounter (Signed)
Med list reviewed and updated accordingly.

## 2013-05-15 NOTE — Telephone Encounter (Signed)
Med list received and pt has been taking Losartan 50 mg daily since 11.20.13.    Message forwarded to Dr. Herbie Baltimore.

## 2013-05-15 NOTE — Telephone Encounter (Signed)
Returned call and pt verified x 2.  Pt stated Dr. Herbie Baltimore doesn't like the clonidine and said she may need to increase losartan.  Stated she  Stated her BP has been in 170s/60s and she believes Dr. Herbie Baltimore told her before he didn't want her in the 170s.  Pt c/o intermittent HAs in the morning.  Stated BP is usually elevated in the evenings and in the morning.  Pt wants to know what dose Dr. Herbie Baltimore wants her to take of the losartan if he increases it.  Pt informed Dr. Herbie Baltimore will be notified for further instructions as he did mention he may need to increase losartan in last OV note.  Pt verbalized understanding and agreed w/ plan.  Message forwarded to Dr. Herbie Baltimore.

## 2013-05-15 NOTE — Telephone Encounter (Signed)
Increase to 50 mg Losartan

## 2013-05-18 ENCOUNTER — Telehealth: Payer: Self-pay | Admitting: *Deleted

## 2013-05-18 NOTE — Telephone Encounter (Signed)
Call to pt.  Left message to call back today before 5pm or Monday between 8am and 4pm.

## 2013-05-21 ENCOUNTER — Telehealth: Payer: Self-pay | Admitting: *Deleted

## 2013-05-21 NOTE — Telephone Encounter (Signed)
Returned call.  Pt stated she has her BP readings and wants to know what RN thinks.  Sat 159/62 (AM)  155/69 (PM) Sun 168/64 (AM) 159/62 (PM) Today 149/59 (AM)  Pt informed BP is coming down and better than 170s systolic.  Pt wanted to know if Dr. Herbie Baltimore would want to double amlodipine.  Pt informed he will be notified of BPs, but may not want to change meds again just yet as it has almost been one week since doubling losartan.  Pt advised to continue current meds for the next week and schedule appt for BP check the first part of next week.  Pt verbalized understanding and agreed w/ plan.  BP check scheduled for 11.3.14 at 11:50am w/ Belenda Cruise, PharmD.  Pt agreed to continue to keep her BP log and bring in to appt w/ her.  Pt will also bring in updated med list or all meds w/ her to appt.  Message forwarded to Dr. Herbie Baltimore for review.

## 2013-05-21 NOTE — Telephone Encounter (Signed)
Go ahead & increase Amlodipine to 10 mg.  Marykay Lex, MD

## 2013-05-21 NOTE — Telephone Encounter (Signed)
Returning your call. °

## 2013-05-21 NOTE — Telephone Encounter (Signed)
Pt replied to Follow-up Questionnaire r/t last OV and c/o elevated BP 150s-170s/60s.  Pt stated she has been taking losartan 100 mg daily since 10.22.14.  Will notify Dr. Herbie Baltimore for further instructions.  Pt was informed she may need to come in for BP check this week.  Message forwarded to Dr. Herbie Baltimore.

## 2013-05-22 NOTE — Telephone Encounter (Signed)
Returned call.  Left message that MD would like BP check and to call back before 4pm.  Appt previously scheduled for 11.3.14 at 11:50am w/ pharmacist.

## 2013-05-22 NOTE — Telephone Encounter (Signed)
Returned call.  Pt informed RN awaiting response from Dr. Herbie Baltimore r/t amlodipine dose and will call back when received.  Pt would like message left on voicemail (home) if not available.

## 2013-05-22 NOTE — Telephone Encounter (Signed)
Returning amber call  Will be leaving house around 11

## 2013-05-24 ENCOUNTER — Ambulatory Visit (INDEPENDENT_AMBULATORY_CARE_PROVIDER_SITE_OTHER): Payer: Medicare Other | Admitting: Pharmacist Clinician (PhC)/ Clinical Pharmacy Specialist

## 2013-05-24 VITALS — BP 148/58 | HR 64

## 2013-05-24 DIAGNOSIS — I10 Essential (primary) hypertension: Secondary | ICD-10-CM

## 2013-05-24 NOTE — Progress Notes (Signed)
I reviewed this encounter, I am in complete agreement with the plan.  Marykay Lex, MD

## 2013-05-24 NOTE — Patient Instructions (Addendum)
Your blood pressure today is good at 148/58  Check your blood pressure at home daily each morning and keep record of the readings.  Take your BP meds as follows:  AM: losartan 100mg   PM: amlodipine 10mg    Bring all of your meds, your BP cuff and your record of home blood pressures to your next appointment.  Exercise as you're able, try to walk approximately 30 minutes per day.  Keep salt intake to a minimum, especially watch canned and prepared boxed foods.  Eat more fresh fruits and vegetables and fewer canned items.  Avoid eating high sodium foods in fast food restaurants.    HOW TO TAKE YOUR BLOOD PRESSURE:   Rest 5 minutes before taking your blood pressure.    Don't smoke or drink caffeinated beverages for at least 30 minutes before.   Take your blood pressure before (not after) you eat.   Sit comfortably with your back supported and both feet on the floor (don't cross your legs).   Elevate your arm to heart level on a table or a desk.   Use the proper sized cuff. It should fit smoothly and snugly around your bare upper arm. There should be enough room to slip a fingertip under the cuff. The bottom edge of the cuff should be 1 inch above the crease of the elbow.   Ideally, take 3 measurements at one sitting and record the average.

## 2013-05-24 NOTE — Progress Notes (Signed)
05/24/2013 [name] [name] Jul 26, 1931 [id number]   HPI:  [name] [name] is a 79 y.o. female patient of Dr. Herbie Baltimore with a PMH below who presents today for blood pressure check.  Pt has been concerned because home readings tend to run 150s/60s in the mornings and slightly higher (into 160s systolic) in the evenings.  Per pt her home BP cuff is a newer model that was purchased within the last month.  She did not bring it with her to the office today.  She states her BP has been elevated since she was in her early 36s, and that her mother, sister and 2 of 3 sons all have had hypertension.  For exercise, she rides a stationary bike 3 miles per day (about 20-25 minutes), does not smoke, drinks the occasional glass of wine, and has 3 cups of coffee each morning (mixes decaf and regular)  She does eat out frequently, as she lives alone.  When at home she does not cook with salt or add any at the table.  Of note, however, on labs, she tends to have low sodium, staying around 128 for much of the past year   Current Outpatient Prescriptions  Medication Sig Dispense Refill  . acetaminophen (TYLENOL) 325 MG tablet Take 650 mg by mouth as needed for pain.      Marland Kitchen ALPRAZolam (XANAX) 0.25 MG tablet Take 0.25 mg by mouth at bedtime as needed. Anxiety/sleep      . amLODipine (NORVASC) 10 MG tablet Take 1 tablet (10 mg total) by mouth daily.      Marland Kitchen aspirin EC 81 MG tablet Take 81 mg by mouth daily.      . calcium carbonate (OS-CAL) 600 MG TABS Take 600 mg by mouth 2 (two) times daily with a meal.      . cholecalciferol (VITAMIN D) 1000 UNITS tablet Take 1,000 Units by mouth daily.      . diphenhydramine-acetaminophen (TYLENOL PM) 25-500 MG TABS Take 2 tablets by mouth at bedtime.      . ferrous sulfate 325 (65 FE) MG tablet Take 325 mg by mouth daily with breakfast.      . fish oil-omega-3 fatty acids 1000 MG capsule Take 1 g by mouth daily.      Marland Kitchen levothyroxine (SYNTHROID, LEVOTHROID) 50 MCG tablet Take 50 mcg by  mouth daily before breakfast.      . losartan (COZAAR) 100 MG tablet Take 1 tablet (100 mg total) by mouth daily.  90 tablet  3  . Multiple Vitamins-Minerals (MULTIVITAMIN PO) Take 1 tablet by mouth daily.      Marland Kitchen omeprazole (PRILOSEC) 20 MG capsule Take 1 capsule (20 mg total) by mouth 2 (two) times daily.      . raloxifene (EVISTA) 60 MG tablet Take 60 mg by mouth daily.       No current facility-administered medications for this visit.    No Known Allergies  Past Medical History  Diagnosis Date  . DDD (degenerative disc disease)   . Osteopenia   . GERD (gastroesophageal reflux disease)   . Hypertension   . Hyperlipidemia   . Vertigo, benign positional   . Hypothyroidism   . Anxiety   . H/O hiatal hernia   . Anemia   . Eczema   . Septicemia 2002    following UTI  . Chronotropic incompetence 12/2012     potentially medication related; noted on CPET     Blood pressure 148/58, pulse 64.  Standing  BP 128/56   ASSESSMENT AND PLAN:  Her BP is within goal today (<150/90) and I assured her that at this reading we would make no changes in her medications.  She does take losartan 100mg  qam and amlodipine 10mg  qpm.  I have asked her to continue with once daily BP checks and we will have her return in 6 weeks for a follow up check.  Of note, when she stood her BP dropped approximately 20 points.  Pt reports no problems with dizziness upon standing.  For now we will leave her medications unchanged and continue with regular monitoring.  If needed, we could switch from losartan to a more powerful ARB (irbesartan or valsartan), but will be mindful of potential orthostatic hypotension.    Phillips Hay PharmD CPP [facility]  .

## 2013-05-28 ENCOUNTER — Encounter: Payer: Medicare Other | Admitting: Pharmacist Clinician (PhC)/ Clinical Pharmacy Specialist

## 2013-05-31 ENCOUNTER — Other Ambulatory Visit: Payer: Self-pay

## 2013-07-05 ENCOUNTER — Encounter: Payer: Medicare Other | Admitting: Pharmacist Clinician (PhC)/ Clinical Pharmacy Specialist

## 2013-07-06 ENCOUNTER — Encounter: Payer: Self-pay | Admitting: Pharmacist Clinician (PhC)/ Clinical Pharmacy Specialist

## 2013-07-06 ENCOUNTER — Ambulatory Visit (INDEPENDENT_AMBULATORY_CARE_PROVIDER_SITE_OTHER): Payer: Medicare Other | Admitting: Pharmacist Clinician (PhC)/ Clinical Pharmacy Specialist

## 2013-07-06 VITALS — BP 130/56 | HR 68 | Ht 66.0 in | Wt 159.2 lb

## 2013-07-06 DIAGNOSIS — I10 Essential (primary) hypertension: Secondary | ICD-10-CM

## 2013-07-06 NOTE — Progress Notes (Signed)
07/06/2013 [name] [name] [birth year] [id number]   HPI:  [name] [name] is a 79 y.o. female patient of Dr. Herbie Baltimore with a PMH below who presents today for blood pressure check.  Her home readings are still running a little high, but when she recently saw Dr. Jacky Kindle her blood pressure was good.  She continues to take losartan 100mg  each am and amlodipine 10mg  each pm.  No side effects or problems with compliance. She did bring her BP cuff into the office today for calibration.  She states her BP has been elevated since she was in her early 44s, and that her mother, sister and 2 of 3 sons all have had hypertension.  For exercise, she rides a stationary bike 3 miles per day (about 20-25 minutes), does not smoke, drinks the occasional glass of wine, and has 3 cups of coffee each morning (mixes decaf and regular)  She does eat out frequently, as she lives alone.  When at home she does not cook with salt or add any at the table.  Of note, however, on labs, she tends to have low sodium, staying around 128 for much of the past year.  She has been trying to walk more, but still feels a little unsteady without a cane, which she does not like to use.     Current Outpatient Prescriptions  Medication Sig Dispense Refill  . acetaminophen (TYLENOL) 325 MG tablet Take 650 mg by mouth as needed for pain.      Marland Kitchen ALPRAZolam (XANAX) 0.25 MG tablet Take 0.25 mg by mouth at bedtime as needed. Anxiety/sleep      . amLODipine (NORVASC) 10 MG tablet Take 1 tablet (10 mg total) by mouth daily.      Marland Kitchen aspirin EC 81 MG tablet Take 81 mg by mouth daily.      . calcium carbonate (OS-CAL) 600 MG TABS Take 600 mg by mouth 2 (two) times daily with a meal.      . cholecalciferol (VITAMIN D) 1000 UNITS tablet Take 1,000 Units by mouth daily.      . diphenhydramine-acetaminophen (TYLENOL PM) 25-500 MG TABS Take 2 tablets by mouth at bedtime.      . fish oil-omega-3 fatty acids 1000 MG capsule Take 1 g by mouth daily.      Marland Kitchen  levothyroxine (SYNTHROID, LEVOTHROID) 50 MCG tablet Take 50 mcg by mouth daily before breakfast.      . losartan (COZAAR) 100 MG tablet Take 1 tablet (100 mg total) by mouth daily.  90 tablet  3  . Multiple Vitamins-Minerals (MULTIVITAMIN PO) Take 1 tablet by mouth daily.      Marland Kitchen omeprazole (PRILOSEC) 20 MG capsule Take 1 capsule (20 mg total) by mouth 2 (two) times daily.      . raloxifene (EVISTA) 60 MG tablet Take 60 mg by mouth daily.       No current facility-administered medications for this visit.    No Known Allergies  Past Medical History  Diagnosis Date  . DDD (degenerative disc disease)   . Osteopenia   . GERD (gastroesophageal reflux disease)   . Hypertension   . Hyperlipidemia   . Vertigo, benign positional   . Hypothyroidism   . Anxiety   . H/O hiatal hernia   . Anemia   . Eczema   . Septicemia 2002    following UTI  . Chronotropic incompetence 12/2012     potentially medication related; noted on CPET  Blood pressure 130/56, pulse 68, height 5\' 6"  (1.676 m), weight 159 lb 3.2 oz (72.213 kg).     ASSESSMENT AND PLAN:  Her BP is very good today.  We compared her home cuff, which is a newer [employer] model.  It actually read about 20 points higher systolic, however I did note that the standard cuff was too small for her arm.  I suggested that the meter is probably quite good, she just needs to purchase a large cuff.   I will have her continue the losartan 100mg  qam and amlodipine 10mg  qpm.  I have asked her to continue with home BP checks several times per week and she is due to see Dr. Herbie Baltimore in February.  She continues to have balance problems, but no sign of orthostatic hypotension.    Phillips Hay PharmD CPP [facility]

## 2013-07-06 NOTE — Patient Instructions (Addendum)
Return for a follow up with Dr. Herbie Baltimore in February  Your blood pressure today is  Good at 130/56  Check your blood pressure at home several times per week and keep record of the readings. (I would suggest getting a larger cuff for your BP machine)  Take your BP meds as follows:  AM: losartan 100mg    PM:  Amlodipine 10mg    Bring all of your meds, your BP cuff and your record of home blood pressures to your next appointment.  Exercise as you're able, try to walk approximately 30 minutes per day.  Keep salt intake to a minimum, especially watch canned and prepared boxed foods.  Eat more fresh fruits and vegetables and fewer canned items.  Avoid eating in fast food restaurants.    HOW TO TAKE YOUR BLOOD PRESSURE:   Rest 5 minutes before taking your blood pressure.    Don't smoke or drink caffeinated beverages for at least 30 minutes before.   Take your blood pressure before (not after) you eat.   Sit comfortably with your back supported and both feet on the floor (don't cross your legs).   Elevate your arm to heart level on a table or a desk.   Use the proper sized cuff. It should fit smoothly and snugly around your bare upper arm. There should be enough room to slip a fingertip under the cuff. The bottom edge of the cuff should be 1 inch above the crease of the elbow.   Ideally, take 3 measurements at one sitting and record the average.

## 2013-07-07 NOTE — Progress Notes (Signed)
Thanks for seeing her.  Irina Okelly W, MD  

## 2013-07-12 NOTE — Progress Notes (Signed)
I agree with this plan.  Thank you for your" followup and assistance.  Marykay Lex, MD

## 2013-09-27 ENCOUNTER — Encounter: Payer: Self-pay | Admitting: Cardiology

## 2013-09-27 ENCOUNTER — Ambulatory Visit (INDEPENDENT_AMBULATORY_CARE_PROVIDER_SITE_OTHER): Payer: Medicare Other | Admitting: Cardiology

## 2013-09-27 VITALS — BP 136/74 | HR 56 | Ht 65.0 in | Wt 161.4 lb

## 2013-09-27 DIAGNOSIS — I10 Essential (primary) hypertension: Secondary | ICD-10-CM

## 2013-09-27 DIAGNOSIS — I4589 Other specified conduction disorders: Secondary | ICD-10-CM

## 2013-09-27 DIAGNOSIS — R0602 Shortness of breath: Secondary | ICD-10-CM

## 2013-09-27 DIAGNOSIS — R079 Chest pain, unspecified: Secondary | ICD-10-CM

## 2013-09-27 NOTE — Patient Instructions (Signed)
Your physician recommends that you schedule a follow-up appointment in: 6 months  

## 2013-09-29 DIAGNOSIS — R0609 Other forms of dyspnea: Secondary | ICD-10-CM | POA: Insufficient documentation

## 2013-09-29 NOTE — Assessment & Plan Note (Signed)
Again, her symptoms are somewhat atypical, and not really having much bleeding any chest discomfort at this time. If symptoms were to worsen, she would be agreeable to a [employer] Myoview

## 2013-09-29 NOTE — Assessment & Plan Note (Signed)
Be socially she is having her quite strange and difficult to figure out. They are not exertional, and are very fleeting. Myelination is that there is no oral bowl condition that is explaining it. She doesn't notice worsening symptoms with exertion which would suggest is probably not related to any cardiac or pulmonary.  Since her CPET test was less than diagnostic, if conditions worsen, we discussed the possibility of doing a nuclear stress test would LexiScan. She feels that this is not necessary at this time.

## 2013-09-29 NOTE — Progress Notes (Signed)
PCP: [name] Lyons, MD  Clinic Note: Chief Complaint  Patient presents with  . Follow-up    having SOB more than usual,"cant get HR over 100"   HPI: [name] [name] is a 80 y.o. female with a Cardiovascular Problem List below who presents today for followup of dyspnea, fatigue and diagnosis of chronotropic incompetence. We made some medication adjustments and stopped her clonidine during her last visit. She was also told that she was anemic during her last visit a hemoglobin of 9 that may be contributing to her dyspnea.  Interval History: Today she comes in relatively stable. She says he still has these intermittent spells where she feels she is gasping for breath. It is not necessarily exertional it can be when she simply sitting at rest. The spells last few seconds and resolve once she takes a deep breath. She denies any chest tightness or pressure with rest or exertion. She really doesn't notice it as much exertional dyspnea, but does note that she still has her heart pounding her heart rate up. The highest she is recorder her heart rate has been <100 with the amount of exertion she is able to do.   She denies any significant lightheadedness, dizziness or wooziness which was something that she had noted last time. She still has some of the chronic vertigo however. She denies any PND, orthopnea or edema. No syncope/near syncope. No TIA/amaurosis fugax. No melena, hematochezia or hematuria. No epistaxis. No claudication.   Past Medical History  Diagnosis Date  . DDD (degenerative disc disease)   . Osteopenia   . GERD (gastroesophageal reflux disease)   . Hypertension   . Hyperlipidemia   . Vertigo, benign positional   . Hypothyroidism   . Anxiety   . H/O hiatal hernia   . Anemia   . Eczema   . Septicemia 2002    following UTI  . Chronotropic incompetence 12/2012     potentially medication related; noted on CPET     Past Cardiac History  Procedure Laterality Date  . Doppler  echocardiography  01/10/2013    Normal LV size and function. Normal EF. Air sclerosis but no stenosis  . Cpet / met - pfts      Consistent with chronotropic incompetence; See attached report in the results section   MEDICATIONS AND ALLERGIES REVIEWED IN EPIC No Change in Social and Family History  ROS: A comprehensive Review of Systems - Negative except Symptoms noted above. Mild arthralgia/arthritis aches and pains.  PHYSICAL EXAM BP 136/74  Pulse 56  Ht _0  (1.651 m)  Wt 161 lb 6.4 oz (73.211 kg)  BMI 26.86 kg/m2 General appearance:  A&O x3, NAD.  Pleasant mood and affect. Cooperative, appears stated age Neck: no adenopathy, no carotid bruit and no JVD Lungs: clear to auscultation bilaterally, normal percussion bilaterally and non-labored Heart: regular rate and rhythm, S1, S2 normal, no click, rub or gallop, nondisplaced PMI.  1-2/6 crescendo-decrescendo 6 SEM at RUSB.  Abdomen: soft, non-tender; bowel sounds normal; no masses,  no organomegaly Extremities: extremities normal, atraumatic, no cyanosis or edema Pulses: 2+ and symmetric; Neurologic: Mental status: Alert, oriented, thought content appropriate; Cranial nerves: normal (II-XII grossly intact)  FXT:[id number] today: Yes Rate:56  , Rhythm: Sinus bradycardia, otherwise normal EKG   Recent Labs:None  ASSESSMENT / PLAN: Chronotropic incompetence She still is bradycardic on no AV nodal agents. While it initially sounds alarming that she cannot get at rate of 100, 70% of maximum heart rate for her age  would be less than 100. With that in mind, I don't think she quite needs to criteria for considering pacemaker. This may just be smoldering conduction disease versus simple effect of aging.  Hypertension Her blood pressure looks great today not on clonidine. She is only on ARB and amlodipine. I would be reluctant to make any adjustments now.  Shortness of breath at rest Be socially she is having her quite strange and  difficult to figure out. They are not exertional, and are very fleeting. Myelination is that there is no oral bowl condition that is explaining it. She doesn't notice worsening symptoms with exertion which would suggest is probably not related to any cardiac or pulmonary.  Since her CPET test was less than diagnostic, if conditions worsen, we discussed the possibility of doing a nuclear stress test would LexiScan. She feels that this is not necessary at this time.  Chest pain with low risk for cardiac etiology Again, her symptoms are somewhat atypical, and not really having much bleeding any chest discomfort at this time. If symptoms were to worsen, she would be agreeable to a LexiScan Myoview    No orders of the defined types were placed in this encounter.    Followup:  6 months   Aidaly Cordner W. Ellyn Hack, M.D., M.S. Interventional Cardiologist [facility]

## 2013-09-29 NOTE — Assessment & Plan Note (Signed)
Her blood pressure looks great today not on clonidine. She is only on ARB and amlodipine. I would be reluctant to make any adjustments now.

## 2013-09-29 NOTE — Assessment & Plan Note (Signed)
She still is bradycardic on no AV nodal agents. While it initially sounds alarming that she cannot get at rate of 100, 70% of maximum heart rate for her age would be less than 100. With that in mind, I don't think she quite needs to criteria for considering pacemaker. This may just be smoldering conduction disease versus simple effect of aging.

## 2013-12-28 ENCOUNTER — Ambulatory Visit: Payer: Medicare Other | Attending: Internal Medicine

## 2013-12-28 DIAGNOSIS — R279 Unspecified lack of coordination: Secondary | ICD-10-CM | POA: Insufficient documentation

## 2013-12-28 DIAGNOSIS — R269 Unspecified abnormalities of gait and mobility: Secondary | ICD-10-CM | POA: Diagnosis not present

## 2013-12-28 DIAGNOSIS — R262 Difficulty in walking, not elsewhere classified: Secondary | ICD-10-CM | POA: Diagnosis not present

## 2013-12-28 DIAGNOSIS — IMO0001 Reserved for inherently not codable concepts without codable children: Secondary | ICD-10-CM | POA: Insufficient documentation

## 2014-01-07 ENCOUNTER — Ambulatory Visit: Payer: Medicare Other | Admitting: Physical Therapy

## 2014-01-07 DIAGNOSIS — IMO0001 Reserved for inherently not codable concepts without codable children: Secondary | ICD-10-CM | POA: Diagnosis not present

## 2014-01-09 ENCOUNTER — Ambulatory Visit: Payer: Medicare Other | Admitting: Physical Therapy

## 2014-01-09 ENCOUNTER — Other Ambulatory Visit: Payer: Self-pay | Admitting: Cardiology

## 2014-01-09 DIAGNOSIS — IMO0001 Reserved for inherently not codable concepts without codable children: Secondary | ICD-10-CM | POA: Diagnosis not present

## 2014-01-09 NOTE — Telephone Encounter (Signed)
Rx was sent to pharmacy electronically. 

## 2014-01-14 ENCOUNTER — Ambulatory Visit: Payer: Medicare Other

## 2014-01-14 DIAGNOSIS — IMO0001 Reserved for inherently not codable concepts without codable children: Secondary | ICD-10-CM | POA: Diagnosis not present

## 2014-01-16 ENCOUNTER — Ambulatory Visit: Payer: Medicare Other

## 2014-01-16 DIAGNOSIS — IMO0001 Reserved for inherently not codable concepts without codable children: Secondary | ICD-10-CM | POA: Diagnosis not present

## 2014-01-23 ENCOUNTER — Ambulatory Visit: Payer: Medicare Other | Attending: Internal Medicine

## 2014-01-23 DIAGNOSIS — R262 Difficulty in walking, not elsewhere classified: Secondary | ICD-10-CM | POA: Diagnosis not present

## 2014-01-23 DIAGNOSIS — R269 Unspecified abnormalities of gait and mobility: Secondary | ICD-10-CM | POA: Diagnosis not present

## 2014-01-23 DIAGNOSIS — R279 Unspecified lack of coordination: Secondary | ICD-10-CM | POA: Diagnosis not present

## 2014-01-23 DIAGNOSIS — IMO0001 Reserved for inherently not codable concepts without codable children: Secondary | ICD-10-CM | POA: Diagnosis not present

## 2014-01-28 ENCOUNTER — Ambulatory Visit: Payer: Medicare Other

## 2014-01-28 DIAGNOSIS — IMO0001 Reserved for inherently not codable concepts without codable children: Secondary | ICD-10-CM | POA: Diagnosis not present

## 2014-01-30 ENCOUNTER — Ambulatory Visit: Payer: Medicare Other

## 2014-01-30 DIAGNOSIS — IMO0001 Reserved for inherently not codable concepts without codable children: Secondary | ICD-10-CM | POA: Diagnosis not present

## 2014-02-04 ENCOUNTER — Ambulatory Visit: Payer: Medicare Other | Admitting: Physical Therapy

## 2014-02-04 DIAGNOSIS — IMO0001 Reserved for inherently not codable concepts without codable children: Secondary | ICD-10-CM | POA: Diagnosis not present

## 2014-02-06 ENCOUNTER — Encounter: Payer: Medicare Other | Admitting: Physical Therapy

## 2014-02-07 ENCOUNTER — Ambulatory Visit: Payer: Medicare Other | Admitting: Physical Therapy

## 2014-02-07 DIAGNOSIS — IMO0001 Reserved for inherently not codable concepts without codable children: Secondary | ICD-10-CM | POA: Diagnosis not present

## 2014-02-11 ENCOUNTER — Ambulatory Visit: Payer: Medicare Other

## 2014-02-11 DIAGNOSIS — IMO0001 Reserved for inherently not codable concepts without codable children: Secondary | ICD-10-CM | POA: Diagnosis not present

## 2014-02-13 ENCOUNTER — Ambulatory Visit: Payer: Medicare Other | Admitting: Physical Therapy

## 2014-02-13 DIAGNOSIS — IMO0001 Reserved for inherently not codable concepts without codable children: Secondary | ICD-10-CM | POA: Diagnosis not present

## 2014-02-18 ENCOUNTER — Encounter: Payer: Medicare Other | Admitting: Physical Therapy

## 2014-04-04 ENCOUNTER — Ambulatory Visit (INDEPENDENT_AMBULATORY_CARE_PROVIDER_SITE_OTHER): Payer: Medicare Other | Admitting: Cardiology

## 2014-04-04 ENCOUNTER — Encounter (HOSPITAL_COMMUNITY): Payer: Self-pay | Admitting: *Deleted

## 2014-04-04 ENCOUNTER — Other Ambulatory Visit (HOSPITAL_COMMUNITY): Payer: Self-pay | Admitting: *Deleted

## 2014-04-04 VITALS — BP 142/88 | HR 66 | Ht 65.5 in | Wt 158.2 lb

## 2014-04-04 DIAGNOSIS — R0609 Other forms of dyspnea: Secondary | ICD-10-CM

## 2014-04-04 DIAGNOSIS — I4589 Other specified conduction disorders: Secondary | ICD-10-CM

## 2014-04-04 DIAGNOSIS — I10 Essential (primary) hypertension: Secondary | ICD-10-CM

## 2014-04-04 DIAGNOSIS — R0989 Other specified symptoms and signs involving the circulatory and respiratory systems: Secondary | ICD-10-CM

## 2014-04-04 DIAGNOSIS — D508 Other iron deficiency anemias: Secondary | ICD-10-CM

## 2014-04-04 DIAGNOSIS — R5381 Other malaise: Secondary | ICD-10-CM

## 2014-04-04 DIAGNOSIS — R5383 Other fatigue: Secondary | ICD-10-CM

## 2014-04-04 NOTE — Patient Instructions (Signed)
Your physician recommends that you schedule a follow-up appointment in: 6-8 weeks  Your physician has requested that you have en exercise stress myoview. For further information please visit [email or web address] Please follow instruction sheet, as given.

## 2014-04-06 ENCOUNTER — Encounter: Payer: Self-pay | Admitting: Cardiology

## 2014-04-06 NOTE — Assessment & Plan Note (Signed)
Her heart is a little bit better today being in the 60s but not on anything that would control heart rate. Plan is to have her do the treadmill portion of a Myoview stress test regardless of whether she meets the part of heart rate or not in order to gather chronotropic competence data.

## 2014-04-06 NOTE — Assessment & Plan Note (Signed)
Slightly elevated blood pressure today on ARB and amlodipine. At this point and will do permissive hypertension is warranted, as was no significant suggestion of diastolic dysfunction.

## 2014-04-06 NOTE — Assessment & Plan Note (Signed)
She continues to be symptomatic despite having a more stable hemoglobin, and being off AV nodal agents. Difficult to say that this is all due to chronotropic incompetence. Will need ischemic evaluation.  If no evidence of coronary ischemia, and symptoms continue to progress the next step would be potential consideration for pacemaker placement, however I think we may need to complete ischemic evaluation with invasive study to exclude multivessel CAD prior to going that direction.

## 2014-04-06 NOTE — Progress Notes (Signed)
PCP: Geoffery Lyons, MD  Clinic Note: Chief Complaint  Patient presents with  . Follow-up    6 months follow-up, pt stated she can't walk very far she get really tired and out of breath, pt denied chest pain and tightness.   HPI: [name] [name] is a 80 y.o. female with a Cardiovascular Problem List below who presents today for six-month followup for chronic dyspnea. She was diagnosed with chronotropic incompetence will try to perform a CPET-MET test. She is also troubled with anemia, but her last hemoglobin was 10.3 per her report..  Interval History: She presents today again quite troubled by the fact that she just really is not able to do any type of activity. She just tires so easily. She can't walk more than one block without getting totally breathless. She does not want to complain of any symptoms of chest tightness or pressure with either rest or exertion. No rapid or irregular heartbeat/palpitations or arrhythmias. She denies any lightheadedness, dizziness or syncope/near-syncope, TIAs or amaurosis fugax.  Interestingly, she says that she is able to ride her stationary bicycle for about 2 to have mild without having trouble.  Past Medical History  Diagnosis Date  . DDD (degenerative disc disease)   . Osteopenia   . GERD (gastroesophageal reflux disease)   . Hypertension   . Hyperlipidemia   . Vertigo, benign positional   . Hypothyroidism   . Anxiety   . H/O hiatal hernia   . Anemia   . Eczema   . Septicemia 2002    following UTI  . Chronotropic incompetence 12/2012     potentially medication related; noted on CPET     Prior Cardiac Evaluation and Past Surgical History: Procedure Laterality Date  . Total knee arthroplasty  04/24/2012    Procedure: TOTAL KNEE ARTHROPLASTY;  Surgeon: Gearlean Alf, MD;  Location: [facility];  Service: Orthopedics;  Laterality: Right;  . Doppler echocardiography  01/10/2013    Normal LV size and function. Normal EF. Air sclerosis but no  stenosis  . Cpet / met - pfts      Consistent with chronotropic incompetence; See attached report in the results section   MEDICATIONS AND ALLERGIES REVIEWED IN EPIC No Change in Social and Family History  ROS: A comprehensive Review of Systems - was performed Review of Systems  Constitutional: Positive for malaise/fatigue. Negative for diaphoresis.       Tires easily  HENT: Negative for nosebleeds.   Eyes: Negative for blurred vision and double vision.  Respiratory: Positive for shortness of breath. Negative for cough, hemoptysis and wheezing.   Cardiovascular: Negative for chest pain, palpitations, orthopnea, claudication, leg swelling and PND.       Exertional dyspnea  Gastrointestinal: Negative for heartburn, constipation and blood in stool.  Genitourinary: Negative for hematuria.  Musculoskeletal: Positive for joint pain.  Neurological: Positive for dizziness. Negative for sensory change, speech change, focal weakness, seizures, loss of consciousness, weakness and headaches.       Chronic vertigo  Endo/Heme/Allergies: Does not bruise/bleed easily.  All other systems reviewed and are negative.  Wt Readings from Last 3 Encounters:  04/04/14 158 lb 3.2 oz (71.759 kg)  09/27/13 161 lb 6.4 oz (73.211 kg)  07/06/13 159 lb 3.2 oz (72.213 kg)   PHYSICAL EXAM BP 142/88  Pulse 66  Ht 5' 5.5" (1.664 m)  Wt 158 lb 3.2 oz (71.759 kg)  BMI 25.92 kg/m2 General appearance: A&O x3, NAD. Pleasant mood and affect. Cooperative, appears stated age  Neck: no adenopathy, no carotid bruit and no JVD  Lungs: clear to auscultation bilaterally, normal percussion bilaterally and non-labored  Heart: regular rate and rhythm, S1, S2 normal, no click, rub or gallop, nondisplaced PMI. 1-2/6 crescendo-decrescendo 6 SEM at RUSB.  Abdomen: soft, non-tender; bowel sounds normal; no masses, no organomegaly  Extremities: extremities normal, atraumatic, no cyanosis or edema  Pulses: 2+ and symmetric;    Neurologic: Mental status: Alert, oriented, thought content appropriate; Cranial nerves: normal (II-XII grossly intact)   Adult ECG Report  Rate: 66 ;  Rhythm: normal sinus rhythm, premature ventricular contractions (PVC) and Cannot exclude septal MI, age undetermined  Narrative Interpretation: Stable EKG  Recent Labs not available:   ASSESSMENT / PLAN: Exertional dyspnea She never complained of anginal symptoms, but perhaps in an elderly woman with hypertension hyperlipidemia she could very well have had a silent MI. Her CPET test results were abnormal, but it is difficult to be explained by chronotropic incompetence. It can be playing a role but at this point I think we need to exclude coronary ischemia with a more robust study.  Plan: Myoview stress test. We will start off with the treadmill portion and asked that they record her heart rate Response on treadmill - and please have that recorded in the results. This allowed to see how well she is doing as far as her chronotropic response but also will give [place] a more complete evaluation for ischemia if LexiScan is required..  Chronotropic incompetence Her heart is a little bit better today being in the 60s but not on anything that would control heart rate. Plan is to have her do the treadmill portion of a Myoview stress test regardless of whether she meets the part of heart rate or not in order to gather chronotropic competence data.  Essential hypertension Slightly elevated blood pressure today on ARB and amlodipine. At this point and will do permissive hypertension is warranted, as was no significant suggestion of diastolic dysfunction.  Anemia Her hemoglobin level appears to be more stable based on last check. Her PCP stopped her iron because she was going to be taking multivitamins with iron. Unfortunately her multivitamins that she had purchased do not have the iron supplement. At this point they would hemoglobin 10.3, she warrants  iron supplementation and therefore recommend that she goes back on type of supplementation.  Fatigue with decreased exercise tolerance She continues to be symptomatic despite having a more stable hemoglobin, and being off AV nodal agents. Difficult to say that this is all due to chronotropic incompetence. Will need ischemic evaluation.  If no evidence of coronary ischemia, and symptoms continue to progress the next step would be potential consideration for pacemaker placement, however I think we may need to complete ischemic evaluation with invasive study to exclude multivessel CAD prior to going that direction.    Orders Placed This Encounter  Procedures  . Myocardial Perfusion Imaging    Standing Status: Future     Number of Occurrences:      Standing Expiration Date: 04/05/2015    Scheduling Instructions:     Dr Ellyn Hack want her to have a treadmill stress test but in case she can not get her heart rate up it's ok to change to [place] but please keep whatever treadmill result to comment on chronotropic incompetent    Order Specific Question:  Where should this test be performed    Answer:  [facility]    Order Specific Question:  Type of stress  Answer:  Treadmill    Order Specific Question:  Patient weight in lbs    Answer:  158  . EKG 12-Lead   No orders of the defined types were placed in this encounter.    Followup: 2 months  Mitzie Marlar W, M.D., M.S. Interventional Cardiologist   Pager # [phone]

## 2014-04-06 NOTE — Assessment & Plan Note (Signed)
She never complained of anginal symptoms, but perhaps in an elderly woman with hypertension hyperlipidemia she could very well have had a silent MI. Her CPET test results were abnormal, but it is difficult to be explained by chronotropic incompetence. It can be playing a role but at this point I think we need to exclude coronary ischemia with a more robust study.  Plan: Myoview stress test. We will start off with the treadmill portion and asked that they record her heart rate Response on treadmill - and please have that recorded in the results. This allowed to see how well she is doing as far as her chronotropic response but also will give [place] a more complete evaluation for ischemia if LexiScan is required.Marland Kitchen

## 2014-04-06 NOTE — Assessment & Plan Note (Addendum)
Her hemoglobin level appears to be more stable based on last check. Her PCP stopped her iron because she was going to be taking multivitamins with iron. Unfortunately her multivitamins that she had purchased do not have the iron supplement. At this point they would hemoglobin 10.3, she warrants iron supplementation and therefore recommend that she goes back on type of supplementation.

## 2014-04-09 ENCOUNTER — Telehealth (HOSPITAL_COMMUNITY): Payer: Self-pay

## 2014-04-09 NOTE — Telephone Encounter (Signed)
Encounter complete. 

## 2014-04-10 ENCOUNTER — Telehealth (HOSPITAL_COMMUNITY): Payer: Self-pay

## 2014-04-10 NOTE — Telephone Encounter (Signed)
Encounter complete. 

## 2014-04-11 ENCOUNTER — Encounter (HOSPITAL_COMMUNITY): Payer: Medicare Other

## 2014-04-16 ENCOUNTER — Telehealth (HOSPITAL_COMMUNITY): Payer: Self-pay | Admitting: *Deleted

## 2014-04-25 ENCOUNTER — Telehealth (HOSPITAL_COMMUNITY): Payer: Self-pay

## 2014-04-25 NOTE — Telephone Encounter (Signed)
Encounter complete. 

## 2014-04-30 ENCOUNTER — Telehealth (HOSPITAL_COMMUNITY): Payer: Self-pay

## 2014-04-30 NOTE — Telephone Encounter (Signed)
Encounter complete. 

## 2014-05-01 ENCOUNTER — Ambulatory Visit (HOSPITAL_COMMUNITY)
Admission: RE | Admit: 2014-05-01 | Discharge: 2014-05-01 | Disposition: A | Discharge status: Home / Self Care | Payer: Medicare Other | Source: Ambulatory Visit | Attending: Cardiovascular Disease | Admitting: Cardiovascular Disease

## 2014-05-01 DIAGNOSIS — R0600 Dyspnea, unspecified: Secondary | ICD-10-CM | POA: Diagnosis not present

## 2014-05-01 DIAGNOSIS — R0602 Shortness of breath: Secondary | ICD-10-CM | POA: Insufficient documentation

## 2014-05-01 DIAGNOSIS — R5383 Other fatigue: Secondary | ICD-10-CM

## 2014-05-01 DIAGNOSIS — I4589 Other specified conduction disorders: Secondary | ICD-10-CM

## 2014-05-01 DIAGNOSIS — R0609 Other forms of dyspnea: Secondary | ICD-10-CM

## 2014-05-01 MED ORDER — TECHNETIUM TC 99M SESTAMIBI GENERIC - CARDIOLITE
31.6000 | Freq: Once | INTRAVENOUS | Status: AC | PRN
  Administered 2014-05-01: 32 via INTRAVENOUS

## 2014-05-01 MED ORDER — TECHNETIUM TC 99M SESTAMIBI GENERIC - CARDIOLITE
10.4000 | Freq: Once | INTRAVENOUS | Status: AC | PRN
  Administered 2014-05-01: 10 via INTRAVENOUS

## 2014-05-01 NOTE — Procedures (Addendum)
 [facility] [address] [place] [address] [phone]  Cardiology Nuclear Med Study  Ronnette HilaBetty J [name] is a 80 y.o. female     MRN : [id number]     DOB: 05/28/1931  Procedure Date: 05/01/2014  Nuclear Med Background Indication for Stress Test:  Evaluation for Ischemia and chronitropic incompetence History:  Aortic injection murmur;No prior respiratory history reported;No prior NUC MPI fo r comparison. Cardiac Risk Factors: History of Smoking, Hypertension, Lipids and hypothyroidism;PVC's  Symptoms:  Dizziness, DOE, Fatigue and SOB   Nuclear Pre-Procedure Caffeine/Decaff Intake:  1:00am NPO After: 11am   IV Site: R Forearm  IV 0.9% NS with Angio Cath:  22g  Chest Size (in):  n/a IV Started by: Berdie OgrenAmanda Wease, RN  Height: 5\' 6"  (1.676 m)  Cup Size: B  BMI:  Body mass index is 25.51 kg/(m^2). Weight:  158 lb (71.668 kg)   Tech Comments:  n/a    Nuclear Med Study 1 or 2 day study: 1 day  Stress Test Type:  Stress  Order Authorizing Provider:  Bryan Lemmaavid Harding, MD   Resting Radionuclide: Technetium 7836m Sestamibi  Resting Radionuclide Dose: 10.4 mCi   Stress Radionuclide:  Technetium 5236m Sestamibi  Stress Radionuclide Dose: 31.6 mCi           Stress Protocol Rest HR: 63 Stress HR:127  Rest BP: 178/74 Stress BP: 222/74  Exercise Time (min): 3:22 METS: 4.70   Predicted Max HR: 139 bpm % Max HR: 91.37 bpm Rate Pressure Product: [id number]  Dose of Adenosine (mg):  n/a Dose of Lexiscan: n/a mg  Dose of Atropine (mg): n/a Dose of Dobutamine: n/a mcg/kg/min (at max HR)  Stress Test Technologist: Ernestene MentionGwen Farrington, CCT Nuclear Technologist: Gonzella LexPam Phillips, CNMT   Rest Procedure:  Myocardial perfusion imaging was performed at rest 45 minutes following the intravenous administration of Technetium 1536m Sestamibi. Stress Procedure:  The patient performed treadmill exercise using a Bruce  Protocol for 3 minutes 22 seconds. The patient stopped  due to shortness of breath and fatigue. Patient denied any chest pain.  There were no significant ST-T wave changes.  Technetium 3336m Sestamibi was injected at peak exercise and myocardial perfusion imaging was performed after a brief delay.  Transient Ischemic Dilatation (Normal <1.22):  1.18  QGS EDV:  88 ml QGS ESV:  28 ml LV Ejection Fraction: 68%        Rest ECG: NSR - Normal EKG  Stress ECG: No significant change from baseline ECG  QPS Raw Data Images:  There is interference from nuclear activity from structures below the diaphragm. This does not affect the ability to read the study. Stress Images:  Normal homogeneous uptake in all areas of the myocardium. Rest Images:  Normal homogeneous uptake in all areas of the myocardium. Subtraction (SDS):  No evidence of ischemia.  Impression Exercise Capacity:  Poor exercise capacity. BP Response:  Normal blood pressure response. Clinical Symptoms:  No significant symptoms noted. ECG Impression:  No significant ST segment change suggestive of ischemia. Comparison with Prior Nuclear Study: No images to compare  Overall Impression:  Low risk stress nuclear study Nl perfusion, increased gut uptake.  LV Wall Motion:  NL LV Function; NL Wall Motion   Runell GessBERRY,Jaser Fullen J, MD  05/01/2014 4:56 PM

## 2014-05-02 NOTE — Progress Notes (Signed)
Quick Note:  Stress Test looked good!! No sign of significant Heart Artery Disease. Pump function is normal.  Good news!!.  HARDING,DAVID W, MD  ______ 

## 2014-05-06 ENCOUNTER — Telehealth: Payer: Self-pay | Admitting: *Deleted

## 2014-05-06 MED ORDER — LOSARTAN POTASSIUM 100 MG PO TABS: ORAL_TABLET | ORAL | Status: DC

## 2014-05-06 NOTE — Telephone Encounter (Signed)
Message copied by Tobin ChadMARTIN, SHARON V. on Mon May 06, 2014 11:26 AM ------      Message from: Marykay LexHARDING, DAVID W      Created: Thu May 02, 2014  6:14 PM       Stress Test looked good!! No sign of significant Heart Artery Disease.  Pump function is normal.            Good news!!.            Marykay LexHARDING,DAVID W, MD       ------

## 2014-05-06 NOTE — Telephone Encounter (Signed)
Spoke to patient. Result given . Verbalized understanding Patient states she will make a decision if she will keep appointment for 10 /22/15. she states she feel well at present. Needed refill of losartan. [facility]

## 2014-05-16 ENCOUNTER — Ambulatory Visit: Payer: Medicare Other | Admitting: Cardiology

## 2015-01-04 ENCOUNTER — Other Ambulatory Visit: Payer: Self-pay | Admitting: Cardiology

## 2015-01-06 NOTE — Telephone Encounter (Signed)
Rx(s) sent to pharmacy electronically.  

## 2015-03-24 ENCOUNTER — Encounter (HOSPITAL_COMMUNITY): Payer: Self-pay | Admitting: *Deleted

## 2015-03-24 ENCOUNTER — Emergency Department (HOSPITAL_COMMUNITY): Payer: Medicare Other

## 2015-03-24 ENCOUNTER — Emergency Department (HOSPITAL_COMMUNITY)
Admission: EM | Admit: 2015-03-24 | Discharge: 2015-03-24 | Disposition: A | Discharge status: Home / Self Care | Payer: Medicare Other | Attending: Emergency Medicine | Admitting: Emergency Medicine

## 2015-03-24 DIAGNOSIS — Y9289 Other specified places as the place of occurrence of the external cause: Secondary | ICD-10-CM | POA: Insufficient documentation

## 2015-03-24 DIAGNOSIS — Z87891 Personal history of nicotine dependence: Secondary | ICD-10-CM | POA: Insufficient documentation

## 2015-03-24 DIAGNOSIS — E039 Hypothyroidism, unspecified: Secondary | ICD-10-CM | POA: Insufficient documentation

## 2015-03-24 DIAGNOSIS — S2231XA Fracture of one rib, right side, initial encounter for closed fracture: Secondary | ICD-10-CM

## 2015-03-24 DIAGNOSIS — S60222A Contusion of left hand, initial encounter: Secondary | ICD-10-CM | POA: Diagnosis not present

## 2015-03-24 DIAGNOSIS — Z8619 Personal history of other infectious and parasitic diseases: Secondary | ICD-10-CM | POA: Insufficient documentation

## 2015-03-24 DIAGNOSIS — Y9389 Activity, other specified: Secondary | ICD-10-CM | POA: Diagnosis not present

## 2015-03-24 DIAGNOSIS — N390 Urinary tract infection, site not specified: Secondary | ICD-10-CM | POA: Insufficient documentation

## 2015-03-24 DIAGNOSIS — Z8669 Personal history of other diseases of the nervous system and sense organs: Secondary | ICD-10-CM | POA: Diagnosis not present

## 2015-03-24 DIAGNOSIS — K219 Gastro-esophageal reflux disease without esophagitis: Secondary | ICD-10-CM | POA: Insufficient documentation

## 2015-03-24 DIAGNOSIS — Z872 Personal history of diseases of the skin and subcutaneous tissue: Secondary | ICD-10-CM | POA: Diagnosis not present

## 2015-03-24 DIAGNOSIS — I10 Essential (primary) hypertension: Secondary | ICD-10-CM | POA: Diagnosis not present

## 2015-03-24 DIAGNOSIS — M8580 Other specified disorders of bone density and structure, unspecified site: Secondary | ICD-10-CM | POA: Insufficient documentation

## 2015-03-24 DIAGNOSIS — Z79899 Other long term (current) drug therapy: Secondary | ICD-10-CM | POA: Insufficient documentation

## 2015-03-24 DIAGNOSIS — Z7982 Long term (current) use of aspirin: Secondary | ICD-10-CM | POA: Insufficient documentation

## 2015-03-24 DIAGNOSIS — Z862 Personal history of diseases of the blood and blood-forming organs and certain disorders involving the immune mechanism: Secondary | ICD-10-CM | POA: Insufficient documentation

## 2015-03-24 DIAGNOSIS — W108XXA Fall (on) (from) other stairs and steps, initial encounter: Secondary | ICD-10-CM | POA: Insufficient documentation

## 2015-03-24 DIAGNOSIS — Y998 Other external cause status: Secondary | ICD-10-CM | POA: Insufficient documentation

## 2015-03-24 DIAGNOSIS — S299XXA Unspecified injury of thorax, initial encounter: Secondary | ICD-10-CM | POA: Diagnosis present

## 2015-03-24 LAB — URINALYSIS, ROUTINE W REFLEX MICROSCOPIC
BILIRUBIN URINE: NEGATIVE
GLUCOSE, UA: NEGATIVE mg/dL
HGB URINE DIPSTICK: NEGATIVE
Ketones, ur: NEGATIVE mg/dL
Nitrite: POSITIVE — AB
PROTEIN: NEGATIVE mg/dL
Specific Gravity, Urine: 1.008 (ref 1.005–1.030)
UROBILINOGEN UA: 0.2 mg/dL (ref 0.0–1.0)
pH: 7.5 (ref 5.0–8.0)

## 2015-03-24 LAB — URINE MICROSCOPIC-ADD ON

## 2015-03-24 MED ORDER — TETANUS-DIPHTH-ACELL PERTUSSIS 5-2.5-18.5 LF-MCG/0.5 IM SUSP: 0.5000 mL | Freq: Once | INTRAMUSCULAR | Status: DC

## 2015-03-24 MED ORDER — CEPHALEXIN 500 MG PO CAPS: 500.0000 mg | ORAL_CAPSULE | Freq: Four times a day (QID) | ORAL | Status: DC

## 2015-03-24 MED ORDER — TRAMADOL HCL 50 MG PO TABS: 50.0000 mg | ORAL_TABLET | Freq: Four times a day (QID) | ORAL | Status: DC | PRN

## 2015-03-24 MED ORDER — ACETAMINOPHEN 325 MG PO TABS
650.0000 mg | ORAL_TABLET | Freq: Once | ORAL | Status: AC
  Administered 2015-03-24: 650 mg via ORAL
  Filled 2015-03-24: qty 2

## 2015-03-24 MED ORDER — CEPHALEXIN 500 MG PO CAPS
1000.0000 mg | ORAL_CAPSULE | Freq: Once | ORAL | Status: AC
  Administered 2015-03-24: 1000 mg via ORAL
  Filled 2015-03-24: qty 2

## 2015-03-24 NOTE — ED Notes (Signed)
Pt arrives via EMS. Pt was going down the back steps, tripped, fell, and hit her right rib area on the brick steps. C/O pain in the right rib area, worse to inspiration & left wrist abrasion.  No LOC.

## 2015-03-24 NOTE — ED Provider Notes (Signed)
CSN: [id number]     Arrival date & time 03/24/15  1348 History   First MD Initiated Contact with Patient 03/24/15 1459     Chief Complaint  Patient presents with  . Fall     (Consider location/radiation/quality/duration/timing/severity/associated sxs/prior Treatment) HPI   81 year old female with history of osteopenia, vertigo, DDD brought here via EMS from home for evaluation of a fall. Patient lives at home by herself. She reports that her backyard steps on it difficult to walk down as there are no arm rail approximately 2 hours ago she was attempting to walk down the steps, missed step and fell for striking her right side of chest against a brick steps. She denies hitting her head or loss of consciousness. She was able to call for help and her neighbor arrived. EMS was called and patient brought here for further evaluation. Patient reports sharp pain to right ribs worsening with movement and improves with rest. States the pain is mild this time. Does report increasing pain with taking deep breath. She also complaining of mild tenderness to left hand. Described as sharp and nonradiating. Otherwise no headache, neck pain, difficulty breathing, abdominal pain, back pain, hips or knee pain, numbness or weakness. She denies any precipitating symptoms prior to the fall. She is not on any anticoagulation.    Past Medical History  Diagnosis Date  . DDD (degenerative disc disease)   . Osteopenia   . GERD (gastroesophageal reflux disease)   . Hypertension   . Hyperlipidemia   . Vertigo, benign positional   . Hypothyroidism   . Anxiety   . H/O hiatal hernia   . Anemia   . Eczema   . Septicemia 2002    following UTI  . Chronotropic incompetence 12/2012     potentially medication related; noted on CPET    Past Surgical History  Procedure Laterality Date  . Dilation and curettage of uterus      x 2  . Breast surgery      left biopsy  . Knee arthroscopy  2011    right  . Tonsillectomy     . Eye surgery      cataract extraction with ILO  ? eye  . Total knee arthroplasty  04/24/2012    Procedure: TOTAL KNEE ARTHROPLASTY;  Surgeon: Gearlean Alf, MD;  Location: [facility];  Service: Orthopedics;  Laterality: Right;  . Esophagogastroduodenoscopy  05/04/2012    Procedure: ESOPHAGOGASTRODUODENOSCOPY (EGD);  Surgeon: Inda Castle, MD;  Location: [facility];  Service: Endoscopy;  Laterality: N/A;  . Doppler echocardiography  01/10/2013    Normal LV size and function. Normal EF. Air sclerosis but no stenosis  . Cpet / met - pfts      Consistent with chronotropic incompetence; See attached report in the results section   Family History  Problem Relation Age of Onset  . Lung cancer Father 68    Died at age 81  . Stroke Mother 4    Died in her late 86s.  Marland Kitchen Heart attack Mother 15   Social History  Substance Use Topics  . Smoking status: Former Smoker -- 0 years    Types: Cigarettes    Quit date: 05/03/1965  . Smokeless tobacco: Never Used  . Alcohol Use: No     Comment: occ glass wine   OB History    No data available     Review of Systems  All other systems reviewed and are negative.     Allergies  Review  of patient's allergies indicates no known allergies.  Home Medications   Prior to Admission medications   Medication Sig Start Date End Date Taking? Authorizing Provider  acetaminophen (TYLENOL) 325 MG tablet Take 650 mg by mouth every 6 (six) hours as needed for mild pain.    Yes Historical Provider, MD  amLODipine (NORVASC) 10 MG tablet Take 1 tablet (10 mg total) by mouth daily. 05/08/12  Yes Ripudeep Krystal Eaton, MD  aspirin EC 81 MG tablet Take 81 mg by mouth daily.   Yes Historical Provider, MD  calcium carbonate (OS-CAL) 600 MG TABS Take 600 mg by mouth 2 (two) times daily with a meal.   Yes Historical Provider, MD  cholecalciferol (VITAMIN D) 1000 UNITS tablet Take 1,000 Units by mouth daily.   Yes Historical Provider, MD  diphenhydramine-acetaminophen  (TYLENOL PM) 25-500 MG TABS Take 1 tablet by mouth at bedtime as needed (for sleep).   Yes Historical Provider, MD  fish oil-omega-3 fatty acids 1000 MG capsule Take 1 g by mouth daily.   Yes Historical Provider, MD  levothyroxine (SYNTHROID, LEVOTHROID) 50 MCG tablet Take 50 mcg by mouth daily before breakfast.   Yes Historical Provider, MD  losartan (COZAAR) 100 MG tablet Take 1 tablet (100 mg total) by mouth daily. PATIENT NEEDS TO CONTACT OFFICE FOR ADDITIONAL REFILLS 01/06/15  Yes Leonie Man, MD  Multiple Vitamins-Minerals (MULTIVITAMIN PO) Take 1 tablet by mouth daily.   Yes Historical Provider, MD  omeprazole (PRILOSEC) 20 MG capsule Take 1 capsule (20 mg total) by mouth 2 (two) times daily. 05/08/12  Yes Ripudeep Krystal Eaton, MD  raloxifene (EVISTA) 60 MG tablet Take 60 mg by mouth daily.   Yes Historical Provider, MD   BP 172/49 mmHg  Pulse 76  Temp(Src) 98.8 F (37.1 C) (Oral)  Resp 18  Ht _0  (1.651 m)  Wt 160 lb (72.576 kg)  BMI 26.63 kg/m2  SpO2 97% Physical Exam  Constitutional: She is oriented to person, place, and time. She appears well-developed and well-nourished. No distress.  Caucasian female appears stated age, laying in bed in no acute discomfort.  HENT:  Head: Normocephalic and atraumatic.  Eyes: Conjunctivae are normal.  Neck: Normal range of motion. Neck supple.  Cardiovascular: Normal rate and regular rhythm.   Pulmonary/Chest: Effort normal and breath sounds normal. She exhibits tenderness (Chest: Tenderness to anterior lateral lower ribs on the right side with moderate ecchymosis. No crepitus or emphysema.).  Abdominal: Soft. There is no tenderness.  Abdomen is soft and nontender.  Musculoskeletal: She exhibits tenderness (Left hand: Ecchymosis noted to ring finger at the distal phalanx without crepitus or deformity. Ecchymosis noted to fall aspects of left wrist without crepitus or deformity. Wrist with full range of motion, radial pulse 2+.).  No pain at the  anatomical snuffbox on left hand.  Neurological: She is alert and oriented to person, place, and time. She has normal strength. No sensory deficit. GCS eye subscore is 4. GCS verbal subscore is 5. GCS motor subscore is 6.  Skin: No rash noted.  Psychiatric: She has a normal mood and affect.  Nursing note and vitals reviewed.   ED Course  Procedures (including critical care time)  Patient had a mechanical fall here with bruising to her right lower ribs and bruising to her left hand and left wrist. She has no significant abdominal discomfort on exam. Low suspicion for internal injury. Plan to obtain x-ray of her ribs for further evaluation. Tylenol given for pain.  5:28 PM Urine shows evidence of a urinary tract infection. Urine culture sent, Keflex prescribed. Rib x-ray demonstrate a minimally displaced fracture involving the right anterolateral eighth rib. This is a closed injury. Definitive fracture care provided including pain medication, incentive spirometry and outpatient follow-up. X-rays also demonstrate a possible nondisplaced fracture involving the right acromion. However patient has no focal point tenderness to suggest a broken acromion. X-ray of left hand without any acute fractures. Rice therapy discussed. Care discussed with Dr. Ralene Bathe.    Pt also has elevated BP, but no headache, CP or signs of end organ damage.  Pt to fu with PCP for recheck.  Labs Review Labs Reviewed  URINALYSIS, ROUTINE W REFLEX MICROSCOPIC (NOT AT [facility]) - Abnormal; Notable for the following:    APPearance CLOUDY (*)    Nitrite POSITIVE (*)    Leukocytes, UA SMALL (*)    All other components within normal limits  URINE MICROSCOPIC-ADD ON - Abnormal; Notable for the following:    Bacteria, UA MANY (*)    All other components within normal limits  URINE CULTURE    Imaging Review Dg Ribs Unilateral W/chest Right  03/24/2015   CLINICAL DATA:  [place] down her back porch steps at home earlier today and landed  on concrete. Right sided rib pain associated with bruising.  EXAM: RIGHT RIBS AND CHEST - 3+ VIEW  COMPARISON:  No prior rib imaging. Chest x-rays 05/03/2012, 05/26/2010.  FINDINGS: The site of maximum pain, tenderness and bruising is marked with a metallic BB. Minimally displaced fracture involving the right anterolateral eighth rib. No other rib fractures. Generalized osseous demineralization.  Cardiac silhouette normal in size, unchanged. Thoracic aorta mildly atherosclerotic, unchanged. Hilar and mediastinal contours otherwise unremarkable. Lungs clear. Bronchovascular markings normal. Pulmonary vascularity normal. No visible pleural effusions. No pneumothorax. Possible nondisplaced fracture involving the right acromion.  IMPRESSION: 1. Minimally displaced fracture involving the right anterolateral eighth rib. 2.  No acute cardiopulmonary disease. 3. Possible nondisplaced fracture involving the right acromion. Please correlate with point tenderness.   Electronically Signed   By: Evangeline Dakin M.D.   On: 03/24/2015 16:39   Dg Hand Complete Left  03/24/2015   CLINICAL DATA:  Fall with diffuse left hand pain. Initial encounter.  EXAM: LEFT HAND - COMPLETE 3+ VIEW  COMPARISON:  None.  FINDINGS: No evidence of fracture or dislocation.  Focally advanced degenerative change at the first [facility] joint with sclerosis and subchondral cyst formation. Generalized osteopenia. Proximal carpal chondrocalcinosis.  IMPRESSION: No acute finding.   Electronically Signed   By: Monte Fantasia M.D.   On: 03/24/2015 16:36   I have personally reviewed and evaluated these images and lab results as part of my medical decision-making.   EKG Interpretation   Date/Time:  Monday March 24 2015 16:02:33 EDT Ventricular Rate:  78 PR Interval:  183 QRS Duration: 103 QT Interval:  391 QTC Calculation: 445 R Axis:   -6 Text Interpretation:  Sinus rhythm Ventricular premature complex Low  voltage, precordial leads Confirmed by  Hazle Coca [phone]) on 03/24/2015  5:14:44 PM      MDM   Final diagnoses:  Fall down steps, initial encounter  Rib fracture, right, closed, initial encounter  UTI (lower urinary tract infection)    BP 188/60 mmHg  Pulse 85  Temp(Src) 98.8 F (37.1 C) (Oral)  Resp 16  Ht _0  (1.651 m)  Wt 160 lb (72.576 kg)  BMI 26.63 kg/m2  SpO2 94%  I have reviewed nursing notes  and vital signs. I personally viewed the imaging tests through PACS system and agrees with radiologist's intepretation I reviewed available ER/hospitalization records through the EMR     Domenic Moras, PA-C 03/24/15 [place], MD 03/25/15 [phone]

## 2015-03-24 NOTE — Discharge Instructions (Signed)
Please use incentive spirometer 10 times every hours for the next week to help getting adequate air in your lung.  Take antibiotic as prescribed for treatment of UTI. Follow up with your doctor for further care.  Rib Fracture A rib fracture is a break or crack in one of the bones of the ribs. The ribs are a group of long, curved bones that wrap around your chest and attach to your spine. They protect your lungs and other organs in the chest cavity. A broken or cracked rib is often painful, but most do not cause other problems. Most rib fractures heal on their own over time. However, rib fractures can be more serious if multiple ribs are broken or if broken ribs move out of place and push against other structures. CAUSES   A direct blow to the chest. For example, this could happen during contact sports, a car accident, or a fall against a hard object.  Repetitive movements with high force, such as pitching a baseball or having severe coughing spells. SYMPTOMS   Pain when you breathe in or cough.  Pain when someone presses on the injured area. DIAGNOSIS  Your caregiver will perform a physical exam. Various imaging tests may be ordered to confirm the diagnosis and to look for related injuries. These tests may include a chest X-ray, computed tomography (CT), magnetic resonance imaging (MRI), or a bone scan. TREATMENT  Rib fractures usually heal on their own in 1-3 months. The longer healing period is often associated with a continued cough or other aggravating activities. During the healing period, pain control is very important. Medication is usually given to control pain. Hospitalization or surgery may be needed for more severe injuries, such as those in which multiple ribs are broken or the ribs have moved out of place.  HOME CARE INSTRUCTIONS   Avoid strenuous activity and any activities or movements that cause pain. Be careful during activities and avoid bumping the injured rib.  Gradually  increase activity as directed by your caregiver.  Only take over-the-counter or prescription medications as directed by your caregiver. Do not take other medications without asking your caregiver first.  Apply ice to the injured area for the first 1-2 days after you have been treated or as directed by your caregiver. Applying ice helps to reduce inflammation and pain.  Put ice in a plastic bag.  Place a towel between your skin and the bag.   Leave the ice on for 15-20 minutes at a time, every 2 hours while you are awake.  Perform deep breathing as directed by your caregiver. This will help prevent pneumonia, which is a common complication of a broken rib. Your caregiver may instruct you to:  Take deep breaths several times a day.  Try to cough several times a day, holding a pillow against the injured area.  Use a device called an incentive spirometer to practice deep breathing several times a day.  Drink enough fluids to keep your urine clear or pale yellow. This will help you avoid constipation.   Do not wear a rib belt or binder. These restrict breathing, which can lead to pneumonia.  SEEK IMMEDIATE MEDICAL CARE IF:   You have a fever.   You have difficulty breathing or shortness of breath.   You develop a continual cough, or you cough up thick or bloody sputum.  You feel sick to your stomach (nausea), throw up (vomit), or have abdominal pain.   You have worsening pain not controlled  with medications.  MAKE SURE YOU:  Understand these instructions.  Will watch your condition.  Will get help right away if you are not doing well or get worse. Document Released: 07/12/2005 Document Revised: 03/14/2013 Document Reviewed: 09/13/2012 [facility] Patient Information 2015 [place], [place]. This information is not intended to replace advice given to you by your health care provider. Make sure you discuss any questions you have with your health care provider.  Urinary Tract  Infection A urinary tract infection (UTI) can occur any place along the urinary tract. The tract includes the kidneys, ureters, bladder, and urethra. A type of germ called bacteria often causes a UTI. UTIs are often helped with antibiotic medicine.  HOME CARE   If given, take antibiotics as told by your doctor. Finish them even if you start to feel better.  Drink enough fluids to keep your pee (urine) clear or pale yellow.  Avoid tea, drinks with caffeine, and bubbly (carbonated) drinks.  Pee often. Avoid holding your pee in for a long time.  Pee before and after having sex (intercourse).  Wipe from front to back after you poop (bowel movement) if you are a woman. Use each tissue only once. GET HELP RIGHT AWAY IF:   You have back pain.  You have lower belly (abdominal) pain.  You have chills.  You feel sick to your stomach (nauseous).  You throw up (vomit).  Your burning or discomfort with peeing does not go away.  You have a fever.  Your symptoms are not better in 3 days. MAKE SURE YOU:   Understand these instructions.  Will watch your condition.  Will get help right away if you are not doing well or get worse. Document Released: 12/29/2007 Document Revised: 04/05/2012 Document Reviewed: 02/10/2012 [facility] Patient Information 2015 [place], [place]. This information is not intended to replace advice given to you by your health care provider. Make sure you discuss any questions you have with your health care provider.

## 2015-03-24 NOTE — ED Notes (Signed)
Bed: WA21 Expected date:  Expected time:  Means of arrival:  Comments: ems 

## 2015-03-24 NOTE — ED Notes (Signed)
Patient was alert, oriented and stable upon discharge. RN went over AVS and patient had no further questions.  

## 2015-03-28 LAB — URINE CULTURE: Culture: >=100000

## 2015-03-30 ENCOUNTER — Telehealth (HOSPITAL_COMMUNITY): Payer: Self-pay

## 2015-03-30 NOTE — Telephone Encounter (Signed)
Post ED Visit - Positive Culture Follow-up  Culture report reviewed by antimicrobial stewardship pharmacist:  Wes Dulaney, Pharm.D., BCPS  Celedonio Miyamoto, Pharm.D., BCPS  Georgina Pillion, [address].D., BCPS  [place], [address].D., BCPS, AAHIVP  Estella Husk, Pharm.D., BCPS, AAHIVP  Elder Cyphers, [address].D., BCPS  Positive Urine culture, >/= 100,000 colonies -> Klebsiella Pneumoniae Treated with Cephalexin, organism sensitive to the same and no further patient follow-up is required at this time.  Arvid Right 03/30/2015, 4:16 AM

## 2015-04-07 ENCOUNTER — Ambulatory Visit: Payer: Self-pay | Admitting: Cardiology

## 2015-04-28 ENCOUNTER — Encounter: Payer: Self-pay | Admitting: Cardiology

## 2015-04-28 ENCOUNTER — Ambulatory Visit (INDEPENDENT_AMBULATORY_CARE_PROVIDER_SITE_OTHER): Payer: Medicare Other | Admitting: Cardiology

## 2015-04-28 VITALS — BP 158/72 | HR 62 | Ht 65.5 in | Wt 157.8 lb

## 2015-04-28 DIAGNOSIS — T733XXD Exhaustion due to excessive exertion, subsequent encounter: Secondary | ICD-10-CM | POA: Diagnosis not present

## 2015-04-28 DIAGNOSIS — R0609 Other forms of dyspnea: Secondary | ICD-10-CM

## 2015-04-28 DIAGNOSIS — I4589 Other specified conduction disorders: Secondary | ICD-10-CM | POA: Diagnosis not present

## 2015-04-28 DIAGNOSIS — I10 Essential (primary) hypertension: Secondary | ICD-10-CM

## 2015-04-28 NOTE — Patient Instructions (Signed)
NO CHANGE IN CURRENT MEDICATIONS   Your physician wants you to follow-up in 12 MONTHS WITH  DR HARDING. You will receive a reminder letter in the mail two months in advance. If you don't receive a letter, please call our office to schedule the follow-up appointment.  

## 2015-04-28 NOTE — Progress Notes (Signed)
PCP: Geoffery Lyons, MD  Clinic Note: Chief Complaint  Patient presents with  . Follow-up    MED REFILL  . Hypertension    HPI: [name] [name] is a 81 y.o. female with a PMH below who presents today for yearly f/u of HTN - ?Chronotropic Incompetence.[name] [name] was last seen on in Oct 2015 - Myoview ordered  Recent Hospitalizations: 03/24/15 o ER for fall - broke her rib; lost her footing walking out her back door - lost her balance.  Studies Reviewed:  Myoview 05/01/14: NORMAL LV Function/Wall Motion. Normal/LOW RISK Myocardial Perfusion Study -  EF ~68%. Only walked 3 min 22 Sec. Peak HR 139 bpm   Interval History: [name] [name] actually presents today not really with many complaints.  The issue with her is that she has been having more balance issues with walking. Usually she "gives out of energy" from trying to stay balanced as far as walking from one place to another rather than dyspnea. D. Strange chest discomfort episodes that she has been having dizzy spells seem to have improved significantly since her social stress situation is reduced. She is now able to walk notably father then she had couple months ago. She's been trying to  Walk at the [facility] shopping center which has several parked images in place. She's able to walk now from one end to the other using the bandages and wall for her balance. She is able to stop and rest if necessary or sit if she becomes dizzy. She feels more stable walking in this environment, has a safe place to avoid falling from balance issues. She says that she gives out because of a lot of concentration with balance, but does not notice any chest pressure or significant dyspnea. The effort of walking does make her somewhat distant but that is also improved.   No heart failure symptoms of PND, orthopnea or edema.  She has rare palpitations but they're not associated with lightheadedness, dizziness, weakness or syncope/near syncope. No TIA/amaurosis  fugax symptoms.  ROS: A comprehensive was performed. Review of Systems  Constitutional: Negative for malaise/fatigue (Overall her energy is improved when doing more walking).  Respiratory: Positive for shortness of breath (If she overdoes it).   Cardiovascular: Negative for claudication.  Gastrointestinal: Negative for blood in stool and melena.  Genitourinary: Negative for hematuria.  Musculoskeletal: Positive for falls (She had a recent fall and broke a few ribs on the right side.).  Neurological: Negative for headaches.       Poor balance  Endo/Heme/Allergies: Does not bruise/bleed easily.  Psychiatric/Behavioral: Negative.   All other systems reviewed and are negative.   Past Medical History  Diagnosis Date  . DDD (degenerative disc disease)   . Osteopenia   . GERD (gastroesophageal reflux disease)   . Hypertension   . Hyperlipidemia   . Vertigo, benign positional   . Hypothyroidism   . Anxiety   . H/O hiatal hernia   . Anemia   . Eczema   . Septicemia ([address]) 2002    following UTI  . Chronotropic incompetence 12/2012     potentially medication related; noted on CPET     Past Surgical History  Procedure Laterality Date  . Dilation and curettage of uterus      x 2  . Breast surgery      left biopsy  . Knee arthroscopy  2011    right  . Tonsillectomy    . Eye surgery  cataract extraction with ILO  ? eye  . Total knee arthroplasty  04/24/2012    Procedure: TOTAL KNEE ARTHROPLASTY;  Surgeon: Gearlean Alf, MD;  Location: [facility];  Service: Orthopedics;  Laterality: Right;  . Esophagogastroduodenoscopy  05/04/2012    Procedure: ESOPHAGOGASTRODUODENOSCOPY (EGD);  Surgeon: Inda Castle, MD;  Location: [facility];  Service: Endoscopy;  Laterality: N/A;  . Doppler echocardiography  01/10/2013    Normal LV size and function. Normal EF. Air sclerosis but no stenosis  . Cpet / met - pfts      Consistent with chronotropic incompetence; See attached report in the  results section    Prior to Admission medications   Medication Sig Start Date End Date Taking? Authorizing Provider  acetaminophen (TYLENOL) 325 MG tablet Take 650 mg by mouth every 6 (six) hours as needed for mild pain.    Yes Historical Provider, MD  amLODipine (NORVASC) 10 MG tablet Take 1 tablet (10 mg total) by mouth daily. 05/08/12  Yes Ripudeep Krystal Eaton, MD  aspirin EC 81 MG tablet Take 81 mg by mouth daily.   Yes Historical Provider, MD  calcium carbonate (OS-CAL) 600 MG TABS Take 600 mg by mouth 2 (two) times daily with a meal.   Yes Historical Provider, MD  cholecalciferol (VITAMIN D) 1000 UNITS tablet Take 1,000 Units by mouth daily.   Yes Historical Provider, MD  diphenhydramine-acetaminophen (TYLENOL PM) 25-500 MG TABS Take 1 tablet by mouth at bedtime as needed (for sleep).   Yes Historical Provider, MD  fish oil-omega-3 fatty acids 1000 MG capsule Take 1 g by mouth daily.   Yes Historical Provider, MD  levothyroxine (SYNTHROID, LEVOTHROID) 50 MCG tablet Take 50 mcg by mouth daily before breakfast.   Yes Historical Provider, MD  losartan (COZAAR) 100 MG tablet Take 100 mg by mouth daily.   Yes Historical Provider, MD  Multiple Vitamins-Minerals (MULTIVITAMIN PO) Take 1 tablet by mouth daily.   Yes Historical Provider, MD  omeprazole (PRILOSEC) 20 MG capsule Take 20 mg by mouth daily.   Yes Historical Provider, MD  raloxifene (EVISTA) 60 MG tablet Take 60 mg by mouth daily.   Yes Historical Provider, MD   No Known Allergies   Social History   Social History  . Marital Status: Widowed    Spouse Name: N/A  . Number of Children: 2  . Years of Education: N/A   Social History Main Topics  . Smoking status: Former Smoker -- 0 years    Types: Cigarettes    Quit date: 05/03/1965  . Smokeless tobacco: Never Used  . Alcohol Use: No     Comment: occ glass wine  . Drug Use: No  . Sexual Activity: Not Asked   Other Topics Concern  . None   Social History Narrative   She is a  widowed mother of 2.   She is a former smoker, with a distant history having quit in 1966.   She does drink an occasional glass of red wine.   She is just now started to try doing exercise with water aerobics.   Family History  Problem Relation Age of Onset  . Lung cancer Father 38    Died at age 78  . Stroke Mother 48    Died in her late 28s.  Marland Kitchen Heart attack Mother 51    Wt Readings from Last 3 Encounters:  04/28/15 157 lb 12.8 oz (71.578 kg)  03/24/15 160 lb (72.576 kg)  05/01/14 158 lb (71.668 kg)  PHYSICAL EXAM BP 158/72 mmHg  Pulse 62  Ht 5' 5.5" (1.664 m)  Wt 157 lb 12.8 oz (71.578 kg)  BMI 25.85 kg/m2 General appearance: A&O x3, NAD. Pleasant mood and affect. Cooperative, appears stated age  52: [place]/AT, EOMI, MMM, anicteric sclera Neck: no adenopathy, no carotid bruit and no JVD  Lungs: clear to auscultation bilaterally, normal percussion bilaterally and non-labored  Heart: regular rate and rhythm, S1, S2 normal, no click, rub or gallop, nondisplaced PMI. 1-2/6 crescendo-decrescendo 6 SEM at RUSB.  Abdomen: soft, non-tender; bowel sounds normal; no masses, no organomegaly  Extremities: extremities normal, atraumatic, no cyanosis or edema  Pulses: 2+ and symmetric;  Neurologic: Mental status: Alert, oriented, thought content appropriate; Cranial nerves: normal (II-XII grossly intact)    Adult ECG Report  Rate: 62 ;  Rhythm: normal sinus rhythm and Normal axis, intervals and durations.;   Narrative Interpretation: normal EKG   Other studies Reviewed: Additional studies/ records that were reviewed today include:  Recent Labs:  No new labs available     ASSESSMENT / PLAN: Problem List Items Addressed This Visit    Fatigue with decreased exercise tolerance (Chronic)    No evidence of chronotropic incompetence on her treadmill stress test. She did show signs of deconditioning which is probably the main issue here. Her symptoms have improved now when she  is walking further than she had been last year. She did a little setback because of her fall and rib fracture, but is getting over that well.  She had a negative Myoview ischemia standpoint.  Recommend continued gradual progression of exercise. Would continue to hold or minimize AV nodal agents.      Exertional dyspnea    Again less noticeable. Negative Myoview, but was able to achieve target heart rate off of AV nodal agent. Likely related to deconditioning and weight      Relevant Orders   EKG 12-Lead (Completed)   Essential hypertension (Chronic)    A little elevated today. Could possibly consider restarting a low dose of a diuretic, however a bit reluctant to do so based on her balance issues. Would want to avoid any lateral and abnormalities. Continue to allow for mild permissive hypertension and she does not have much the way of diastolic heart failure symptoms.      Relevant Medications   losartan (COZAAR) 100 MG tablet   Other Relevant Orders   EKG 12-Lead (Completed)   Chronotropic incompetence - Primary (Chronic)    Easily reach target heart rate and more during the treadmill portion of her Myoview stress test. Actually as quickly as she reached her peak heart rate, this would indicate significant deconditioning. Because of the concern in the past, would continue to avoid AV nodal agents.      Relevant Orders   EKG 12-Lead (Completed)      Current medicines are reviewed at length with the patient today. (+/- concerns) none The following changes have been made: none Studies Ordered:   Orders Placed This Encounter  Procedures  . EKG 12-Lead    ROV 1 year  [name] [name], M.D., M.S. Interventional Cardiologist   Pager # [phone]

## 2015-04-29 ENCOUNTER — Encounter: Payer: Self-pay | Admitting: Cardiology

## 2015-04-29 NOTE — Assessment & Plan Note (Signed)
No evidence of chronotropic incompetence on her treadmill stress test. She did show signs of deconditioning which is probably the main issue here. Her symptoms have improved now when she is walking further than she had been last year. She did a little setback because of her fall and rib fracture, but is getting over that well.  She had a negative Myoview ischemia standpoint.  Recommend continued gradual progression of exercise. Would continue to hold or minimize AV nodal agents.

## 2015-04-29 NOTE — Assessment & Plan Note (Signed)
A little elevated today. Could possibly consider restarting a low dose of a diuretic, however a bit reluctant to do so based on her balance issues. Would want to avoid any lateral and abnormalities. Continue to allow for mild permissive hypertension and she does not have much the way of diastolic heart failure symptoms.

## 2015-04-29 NOTE — Assessment & Plan Note (Signed)
Easily reach target heart rate and more during the treadmill portion of her Myoview stress test. Actually as quickly as she reached her peak heart rate, this would indicate significant deconditioning. Because of the concern in the past, would continue to avoid AV nodal agents.

## 2015-04-29 NOTE — Assessment & Plan Note (Signed)
Again less noticeable. Negative Myoview, but was able to achieve target heart rate off of AV nodal agent. Likely related to deconditioning and weight

## 2015-06-05 ENCOUNTER — Ambulatory Visit: Payer: Medicare Other

## 2015-06-13 ENCOUNTER — Other Ambulatory Visit (HOSPITAL_COMMUNITY): Payer: Self-pay

## 2015-06-16 ENCOUNTER — Ambulatory Visit (HOSPITAL_COMMUNITY)
Admission: RE | Admit: 2015-06-16 | Discharge: 2015-06-16 | Disposition: A | Discharge status: Home / Self Care | Payer: Medicare Other | Source: Ambulatory Visit | Attending: Internal Medicine | Admitting: Internal Medicine

## 2015-06-16 DIAGNOSIS — M810 Age-related osteoporosis without current pathological fracture: Secondary | ICD-10-CM | POA: Diagnosis not present

## 2015-06-16 MED ORDER — DENOSUMAB 60 MG/ML ~~LOC~~ SOLN
60.0000 mg | Freq: Once | SUBCUTANEOUS | Status: DC
Start: 2015-06-16 — End: 2015-06-17
  Administered 2015-06-16: 60 mg via SUBCUTANEOUS
  Filled 2015-06-16: qty 1

## 2015-06-16 NOTE — Discharge Instructions (Signed)
Denosumab injection  What is this medicine?  DENOSUMAB (den oh sue mab) slows bone breakdown. Prolia is used to treat osteoporosis in women after menopause and in men. Xgeva is used to prevent bone fractures and other bone problems caused by cancer bone metastases. Xgeva is also used to treat giant cell tumor of the bone.  This medicine may be used for other purposes; ask your health care provider or pharmacist if you have questions.  What should I tell my health care provider before I take this medicine?  They need to know if you have any of these conditions:  -dental disease  -eczema  -infection or history of infections  -kidney disease or on dialysis  -low blood calcium or vitamin D  -malabsorption syndrome  -scheduled to have surgery or tooth extraction  -taking medicine that contains denosumab  -thyroid or parathyroid disease  -an unusual reaction to denosumab, other medicines, foods, dyes, or preservatives  -pregnant or trying to get pregnant  -breast-feeding  How should I use this medicine?  This medicine is for injection under the skin. It is given by a health care professional in a hospital or clinic setting.  If you are getting Prolia, a special MedGuide will be given to you by the pharmacist with each prescription and refill. Be sure to read this information carefully each time.  For Prolia, talk to your pediatrician regarding the use of this medicine in children. Special care may be needed. For Xgeva, talk to your pediatrician regarding the use of this medicine in children. While this drug may be prescribed for children as young as 13 years for selected conditions, precautions do apply.  Overdosage: If you think you have taken too much of this medicine contact a poison control center or emergency room at once.  NOTE: This medicine is only for you. Do not share this medicine with others.  What if I miss a dose?  It is important not to miss your dose. Call your doctor or health care professional if you are  unable to keep an appointment.  What may interact with this medicine?  Do not take this medicine with any of the following medications:  -other medicines containing denosumab  This medicine may also interact with the following medications:  -medicines that suppress the immune system  -medicines that treat cancer  -steroid medicines like prednisone or cortisone  This list may not describe all possible interactions. Give your health care provider a list of all the medicines, herbs, non-prescription drugs, or dietary supplements you use. Also tell them if you smoke, drink alcohol, or use illegal drugs. Some items may interact with your medicine.  What should I watch for while using this medicine?  Visit your doctor or health care professional for regular checks on your progress. Your doctor or health care professional may order blood tests and other tests to see how you are doing.  Call your doctor or health care professional if you get a cold or other infection while receiving this medicine. Do not treat yourself. This medicine may decrease your body's ability to fight infection.  You should make sure you get enough calcium and vitamin D while you are taking this medicine, unless your doctor tells you not to. Discuss the foods you eat and the vitamins you take with your health care professional.  See your dentist regularly. Brush and floss your teeth as directed. Before you have any dental work done, tell your dentist you are receiving this medicine.  Do   not become pregnant while taking this medicine or for 5 months after stopping it. Women should inform their doctor if they wish to become pregnant or think they might be pregnant. There is a potential for serious side effects to an unborn child. Talk to your health care professional or pharmacist for more information.  What side effects may I notice from receiving this medicine?  Side effects that you should report to your doctor or health care professional as soon as  possible:  -allergic reactions like skin rash, itching or hives, swelling of the face, lips, or tongue  -breathing problems  -chest pain  -fast, irregular heartbeat  -feeling faint or lightheaded, falls  -fever, chills, or any other sign of infection  -muscle spasms, tightening, or twitches  -numbness or tingling  -skin blisters or bumps, or is dry, peels, or red  -slow healing or unexplained pain in the mouth or jaw  -unusual bleeding or bruising  Side effects that usually do not require medical attention (Report these to your doctor or health care professional if they continue or are bothersome.):  -muscle pain  -stomach upset, gas  This list may not describe all possible side effects. Call your doctor for medical advice about side effects. You may report side effects to FDA at 1-800-FDA-1088.  Where should I keep my medicine?  This medicine is only given in a clinic, doctor's office, or other health care setting and will not be stored at home.  NOTE: This sheet is a summary. It may not cover all possible information. If you have questions about this medicine, talk to your doctor, pharmacist, or health care provider.      2016, Elsevier/Gold Standard. (2012-01-10 12:37:47)

## 2015-06-17 ENCOUNTER — Encounter: Payer: Self-pay | Admitting: Rehabilitation

## 2015-06-17 ENCOUNTER — Ambulatory Visit: Payer: Medicare Other | Attending: Internal Medicine | Admitting: Rehabilitation

## 2015-06-17 DIAGNOSIS — M546 Pain in thoracic spine: Secondary | ICD-10-CM | POA: Diagnosis not present

## 2015-06-17 DIAGNOSIS — R262 Difficulty in walking, not elsewhere classified: Secondary | ICD-10-CM

## 2015-06-17 NOTE — Therapy (Signed)
[facility] [address] [place], [place], [address] Phone: [phone]   Fax:  [phone]  Physical Therapy Evaluation  Patient Details  Name: [name] [name] MRN: [id number] Date of Birth: Jan 22, 1931 Referring Provider: Dr. Burnard Bunting  Encounter Date: 06/17/2015      PT End of Session - 06/17/15 1710    Visit Number 1   Date for PT Re-Evaluation 08/12/15   PT Start Time 1445   PT Stop Time 1540   PT Time Calculation (min) 55 min   Activity Tolerance Patient tolerated treatment well      Past Medical History  Diagnosis Date  . DDD (degenerative disc disease)   . Osteopenia   . GERD (gastroesophageal reflux disease)   . Hypertension   . Hyperlipidemia   . Vertigo, benign positional   . Hypothyroidism   . Anxiety   . H/O hiatal hernia   . Anemia   . Eczema   . Septicemia ([place]) 2002    following UTI  . Chronotropic incompetence 12/2012     potentially medication related; noted on CPET     Past Surgical History  Procedure Laterality Date  . Dilation and curettage of uterus      x 2  . Breast surgery      left biopsy  . Knee arthroscopy  2011    right  . Tonsillectomy    . Eye surgery      cataract extraction with ILO  ? eye  . Total knee arthroplasty  04/24/2012    Procedure: TOTAL KNEE ARTHROPLASTY;  Surgeon: Gearlean Alf, MD;  Location: [facility];  Service: Orthopedics;  Laterality: Right;  . Esophagogastroduodenoscopy  05/04/2012    Procedure: ESOPHAGOGASTRODUODENOSCOPY (EGD);  Surgeon: Inda Castle, MD;  Location: [facility];  Service: Endoscopy;  Laterality: N/A;  . Doppler echocardiography  01/10/2013    Normal LV size and function. Normal EF. Air sclerosis but no stenosis  . Cpet / met - pfts      Consistent with chronotropic incompetence; See attached report in the results section    There were no vitals filed for this visit.  Visit Diagnosis:  Left-sided thoracic back pain -  Plan: PT PLAN OF CARE CERT/RE-CERT  Difficulty walking - Plan: PT PLAN OF CARE CERT/RE-CERT      Subjective Assessment - 06/17/15 1445    Subjective "I have a problem with balance". Has had therapy for this in the past.  Also having the back pain.  [place] out the back door about 8 weeks ago due to losing balance.  Fell into the brick ledge and broke a rib.  That got better.  Now having thoracic region pain starting from the L side and moving into the center.  imaging showing 2 thoracic compression fractures on the L.    Pertinent History stenosis, bulging discs lumbar spine, and coccyx dislocation 25 years ago.  2 compression fractures on the L side, osteoporosis   Diagnostic tests xrays showing compression fractures   Patient Stated Goals help with the balance, decrease the back pain   Currently in Pain? No/denies  with 2 tylenol and tramadol   Pain Score --  without medication 10/10   Pain Location Back  L thoracic spine   Pain Orientation Left   Pain Descriptors / Indicators Aching;Burning   Pain Type Acute pain   Pain Onset More than a month ago   Pain Frequency Intermittent   Aggravating Factors  standing x  5 minutes, any activity causes increased pain.    Pain Relieving Factors medication, heat            [facility] PT Assessment - 06/17/15 0001    Assessment   Medical Diagnosis thoracic pain   Referring Provider Dr. Burnard Bunting   Onset Date/Surgical Date 04/17/15   Next MD Visit not scheduled   Prior Therapy no   Precautions   Precautions Fall   Restrictions   Weight Bearing Restrictions No   Balance Screen   Has the patient fallen in the past 6 months Yes   How many times? 1   Has the patient had a decrease in activity level because of a fear of falling?  Yes   Is the patient reluctant to leave their home because of a fear of falling?  Yes   [place] residence   Living Arrangements Alone   Type of [place] Access  Stairs to enter   Entrance Stairs-Number of Steps 3   Prior Function   Level of Independence Independent   Cognition   Overall Cognitive Status Within Functional Limits for tasks assessed   Observation/Other Assessments   Focus on Therapeutic Outcomes (FOTO)  60% limited   Posture/Postural Control   Posture/Postural Control Postural limitations   Postural Limitations Rounded Shoulders;Forward head;Increased thoracic kyphosis   ROM / Strength   AROM / PROM / Strength AROM;PROM;Strength   AROM   Overall AROM  Due to precautions   Overall AROM Comments seated thoracic rotation and sidebending bil decreased 50%; UE flex, abd, ER decreased due to thoracic kyphosis   Palpation   Palpation comment +2 ttp L rhomboids, thoracic region T5-10, and latissismus and intercostals near ribs 7-9   Ambulation/Gait   Assistive device Straight cane   Gait Pattern Decreased step length - right;Decreased step length - left                   [facility] Adult PT Treatment/Exercise - 06/17/15 0001    Exercises   Exercises Shoulder;Lumbar;Other Exercises   Lumbar Exercises: Stretches   Standing Side Bend 1 rep;30 seconds   Standing Side Bend Limitations seated   Shoulder Exercises: Seated   Retraction AROM;10 reps   Shoulder Exercises: Standing   Other Standing Exercises shoulder press standing at the wall 10"x3   Modalities   Modalities Moist Heat   Moist Heat Therapy   Number Minutes Moist Heat 15 Minutes   Moist Heat Location Other (comment)  thoracic spine; seated in chair                PT Education - 06/17/15 1710    Education provided Yes   Education Details HEP, handout on compression fractures   Person(s) Educated Patient   Methods Explanation;Demonstration;Handout   Comprehension Verbalized understanding;Returned demonstration;Need further instruction          PT Short Term Goals - 06/17/15 1717    PT SHORT TERM GOAL #1   Title pt will decrease resting thoracic  pain to 6/10 without tramadol   Time 3   Period Weeks   Status New   PT SHORT TERM GOAL #2   Title balance will be assessed with appropriate goal written   Time 3   Period Weeks   Status New           PT Long Term Goals - 06/17/15 1715    PT LONG TERM GOAL #1   Title pt  will be independent with final HEP   Time 8   Period Weeks   Status New   PT LONG TERM GOAL #2   Title pt will improve FOTO score to 45% or less impairment   Time 8   Period Weeks   Status New   PT LONG TERM GOAL #3   Title pt will decrease resting thoracic pain to 4/10 or less without tramadol   Time 8   Period Weeks   Status New               Plan - 2015-06-20 1711    Clinical Impression Statement Pt presents with 2 thoracic spinal compression fractures (unknown levels) after a fall onto a concrete block 8 weeks ago.  Pt presents with L sided muscular pain and tenderness from the rhomboids, into the latissimus, and intercostals mid ribs on the L that is only decreased with pain medication.  pt also presents with a need for a balance assessment when the thoracic pain starts to reside, or as tolerated   Pt will benefit from skilled therapeutic intervention in order to improve on the following deficits Abnormal gait;Decreased activity tolerance;Decreased knowledge of precautions;Decreased strength;Difficulty walking;Postural dysfunction;Pain   Rehab Potential Good   PT Frequency 2x / week   PT Duration 8 weeks   PT Treatment/Interventions ADLs/Self Care Home Management;Electrical Stimulation;Moist Heat;Therapeutic activities;Therapeutic exercise;Patient/family education;Manual techniques   PT Next Visit Plan decreased L sided thoracic pain; STM, stretches(avoiding flexion), modalities,     balance assessment eventually   Consulted and Agree with Plan of Care Patient          G-Codes - 06-20-15 1719    Functional Assessment Tool Used FOTO 60% limited   Functional Limitation Carrying, moving and  handling objects   Carrying, Moving and Handling Objects Current Status (P1121) At least 60 percent but less than 80 percent impaired, limited or restricted   Carrying, Moving and Handling Objects Goal Status (K2446) At least 40 percent but less than 60 percent impaired, limited or restricted       Problem List Patient Active Problem List   Diagnosis Date Noted  . Exertional dyspnea 09/29/2013  . Essential hypertension 01/29/2013  . Fatigue with decreased exercise tolerance 12/25/2012  . Chronotropic incompetence 12/24/2012  . Aortic ejection murmur 12/22/2012  . Esophagitis 05/04/2012  . GI bleed 05/03/2012  . Anemia 05/03/2012  . Postop Transfusion 04/27/2012  . OA (osteoarthritis) of knee 04/24/2012    Stark Bray, DPT, CMP 2015-06-20, 5:23 PM  [facility] [address] [place], [place], [address] Phone: [phone]   Fax:  [phone]  Name: [name] [name] MRN: [id number] Date of Birth: [birth year]

## 2015-06-17 NOTE — Patient Instructions (Addendum)
HEP: scapular retractions, seated L UE overhead trunk stretch, standing at wall isometric press

## 2015-06-24 ENCOUNTER — Encounter: Payer: Self-pay | Admitting: Physical Therapy

## 2015-06-24 ENCOUNTER — Ambulatory Visit: Payer: Medicare Other | Admitting: Physical Therapy

## 2015-06-24 DIAGNOSIS — R262 Difficulty in walking, not elsewhere classified: Secondary | ICD-10-CM

## 2015-06-24 DIAGNOSIS — M546 Pain in thoracic spine: Secondary | ICD-10-CM

## 2015-06-24 NOTE — Patient Instructions (Signed)
DO's and DON'T's   Avoid and/or Minimize positions of forward bending ( flexion)  Side bending and rotation of the trunk  Especially when movements occur together   When your back aches:   Don't sit down   Lie down on your back with a small pillow under your head and one under your knees or as outlined by our therapist. Or, lie in the 90/90 position ( on the floor with your feet and legs on the sofa with knees and hips bent to 90 degrees)  Tying or putting on your shoes:   Don't bend over to tie your shoes or put on socks.  Instead, bring one foot up, cross it over the opposite knee and bend forward (hinge) at the hips to so the task.  Keep your back straight.  If you cannot do this safely, then you need to use long handled assistive devices such as a shoehorn and sock puller.  Exercising:  Don't engage in ballistic types of exercise routines such as high-impact aerobics or jumping rope  Don't do exercises in the gym that bring you forward (abdominal crunches, sit-ups, touching your  toes, knee-to-chest, straight leg raising.)  Follow a regular exercise program that includes a variety of different weight-bearing activities, such as low-impact aerobics, T' ai chi or walking as your physical therapist advises  Do exercises that emphasize return to normal body alignment and strengthening of the muscles that keep your back straight, as outlined in this program or by your therapist  Household tasks:  Don't reach unnecessarily or twist your trunk when mopping, sweeping, vacuuming, raking, making beds, weeding gardens, getting objects ou of cupboards, etc.  Keep your broom, mop, vacuum, or rake close to you and mover your whole body as you move them. Walk over to the area on which you are working. Arrange kitchen, bathroom, and bedroom shelves so that frequently used items may be reached without excessive bending, twisting, and reaching.  Use a  sturdy stool if necessary.  Don't bend from the waist to pick up something up  Off the floor, out of the trunk of your car, or to brush your teeth, wash your face, etc.   Bend at the knees, keeping back straight as possible. Use a reacher if necessary.   Prevention of fracture is the so-called "BOTTOm -Line" in the management of OSTEOPOROSIS. Do not take unnecessary chances in movement. Once a compression fracture occurs, the process is very difficult to control; one fracture is frequently followed by many more.     Osteoporosis   What is Osteoporosis?  - A silent disease in which the skeleton is weakened by decreased bone density. - Characterized by low bone mass, deterioration of bone, and increased risk of fracture postmenopausal (primary) or the result of an identifiable condition/event (secondary) - Commonly found in the wrists, spine, and hips; these are high-risk stress areas and very susceptible to fractures.  The Facts: - There are 1.5 million fractures/year o 500,000 spine; 250,000 hip with over 60,000 nursing home admissions secondary to hip fracture; and 200,000 wrist - After hip fracture, only 50% of people able to walk independently prior to the fracture return to independent ambulation. - Bone mass: Peaks at age 20-30, and begins declining at age 40-50.   Osteoporosis is defined by the World Health Organization (WHO) as:  NOF/WHO Criteria for Interpreting Results of Bone Density Assessment  Results Diagnosis  Within 1 standard deviation (SD) of young adult mean Normal  Between 1 and -2.5   SD below mean, repeat in 2 years Low bone mass (osteopenia)  Greater than -2.5 SD below mean Osteoporosis  Greater than -2.5 SD below mean and one or more fragility fractures exist Severe Osteoporosis  *Results can be affected by positioning of the body in the DEXA scan, presence of current or old fractures, arthritis, extraneous calcifications.    Osteoporosis is not just a  women's disease!  - 30-40% of women will develop osteoporosis - 5-15% of males will develop osteoporosis   What are the risk factors?  1. Female 2. Thin, small frame 3. Caucasian, Asian race 4. Early menopause (<45 years old)/amenorrhea/delayed puberty 5. Old age 6. Family history (fractures, stooped posture)\ 7. Low calcium diet 8. Sedentary lifestyle 9. Alcohol, Caffeine, Smoking 10. Malnutrition, GI Disease 11. Prolonged use of Glucocorticoids (Prednisone), Meds to treat asthma, arthritis, cancers, thyroid, and anti-seizure meds.  How do I know for sure?  Get a BONE DENSITY TEST!  This measures bone loss and it's painless, non-invasive, and only takes 5-10 minutes!  What can I do about it?  ? Decrease your risk factors (alcohol, caffeine, smoking) ? Helpful medications (see next [place]) ? Adequate Calcium and Vitamin D intake ? Get active! o Proper posture - Sit and stand tall! No slouching or twisting o Weight-Bearing Exercise - walking, stair climbing, elliptical; NO jogging or high-impact exercise. o Resistive Exercise - Cybex weight equipment, Nautilus, dumbbells, therabands  **Be sure to maintain proper alignment when lifting any weight!!  **When using equipment, avoid abdominal exercises which involve "crunching" or curling or twisting the trunk, biceps machines, cross-country machines, moving handlebars, or ANY MACHINE WITH ROTATION OR FORWARD BENDING!!!           Approved Pharmacologic Management of Osteoporosis  Agent Approved for prevention Approved for treatment BMD increased spine/hip Fracture reduction  Estrogen/Hormone Therapy (Estrace, Estratab, Ogen, Premarin, Vivell, Prempro, Femhert, Orthoest) Yes Yes 3-6% 35% spine and hip  Bisphosphonates  (Fosamax, Actonel, Boniva) Yes Yes 3-8% 35-50% spine and non-spine  Calcitonin (Miacalcin, Calcimar, Fortical) No Yes 0-3% None stated  Raloxifene (Evista) Yes Yes 2-3% 30-55%  Parathyroid  Hormone (Forteo) No Yes, only in those at high risk for fracture None stated 53-65%     Recommended Daily Calcium Intakes   Population Group NIH/NOF* (mg elemental calcium)  Children 1-10 years 800-1200  Children 11-24 years 1200-1500  Men and Women 25-64 years At least 1200  Pregnant/Lactating At least 1200  Postmenopausal women with hormone replacement therapy At least 1200  Postmenopausal women without   hormone replacement therapy At least 1200  Men and women 65 + At least 1200  *In 1987, 1990, 1994, and 2000, the NIH held consensus conferences on osteoporosis and calcium.  This column shows the most recent recommendations regarding calcium intak for preventing and managing osteoporosis.          Calcium Content of Selected Foods  Dairy Foods Calcium Content (mg) Non-Dairy Foods Calcium Content (mg)  Buttermilk, 1 cup 300 Calcium-fortified juice, 1 cup 300  Milk, 1 cup 300 Salmon, canned with Bones, 2 oz 100  Lactaid milk, 1 cup 300-500 Oysters, raw 13-19 medium 226  Soy milk, 1 cup 200-300 Sardines, canned with bones, 3 oz 372  Yogurt (plain, lowfat) 1 cup 250-300 Shrimp, canned 3 oz 98  Frozen yogurt (fruit) 1 cup 200-600 Collard greens, cooked 1 cup 357  Cheddar, mozzarella, or [place] cheese, 1oz 205 Broccoli, cooked 1 cup 78  Cottage cheese (lowfat) 4 oz 200 Soybeans, cooked 1   cup 131  Part-skim ricotta cheese, 4oz 335 Tofu, 4oz* *  Vanilla ice cream, 1 cup 120-300    *Calcium content of tofu varies depending on processing method; check nutritional label on package for precise calcium content.     Suggested Guidelines for Calcium Supplement Use:  ? Calcium is absorbed most efficiently if taken in small amounts throughout the day.  Always divide the daily dose into smaller amounts if the total daily dose is 500mg or more per day.  The body cannot use more than 500mg Calcium at any one time. ? The use of manufactured supplements is encouraged.   Calcium as bone meal or dolomite may contain lead or other heavy metals as contaminants. ? Calcium supplements should not be taken with high fiber meals or with bulk forming laxatives. ? If calcium carbonate is used as the supplement form, it should be taken with meals to assure that stomach acid production is present to facilitate optimal dissolution and absorption of calcium.  This is important if atrophic gastritis with hypo- or achlorhydria is present, which it is in 20-50% of older individuals. ? It is important to drink plenty of fluids while using the supplement to help reduce problems with side effects like constipation or bloating.  If these symptoms become a problem, switching to another form of supplement may be the answer. ? Another alternative is calcium-fortified foods, including fruit juices, cereals, and breads.  These foods are now marketed with added calcium and may be less likely to cause side effects. ? Those with personal or family histories of kidney stones should be monitored to assure that hypercalcuria does not occur. CALCIUM INTAKE QUIZ  Dairy products are the primary source of calcium for most people.  For a quick estimate of your daily calcium intake, complete the following steps:  1. Use the chart below to determine your daily intake of calcium from diary foods. Servings of dairy per day 1 2 3 4 5 6 7 8  Milligrams (mg) of calcium: 250 500 750 1000 1250 1500 1750 2000   2.  Enter your total daily calcium intake from dairy foods:     _____mg  3.  Add 350 mg, which is the average for all other dietary sources:                 +            350 mg  4.  The sum of your total daily calcium intake:               ______mg  5.  Enter the recommended calcium intake for your age from the chart below;         ______mg  6.  Enter your daily intake from step 4 above and subtract:                             -        _______mg  7.  The result is how much additional calcium you  need:                                          ______mg      Recommended Daily Calcium Intake  Population Calcium (mg)  Children 1-10 years 800-1200  Children 11-24 years 1200-1500  Men and women 25-64 At   least 1200  Pregnant/Lactating At least 1200  Postmenopausal women with hormone replacement therapy At least 1200  Postmenopausal women without hormone replacement therapy At least 1200  Men and women 65+ At least 1200       SAFETY TIPS FOR FALL PREVENTION   1. Remove throw rugs and make certain carpet edges are securely fastened to the floor.  2. Reduce clutter, especially in traffic areas of the home.   3. Install/maintain sturdy handrails at stairs.  4. Increase wattage of lighting in hallways, bathrooms, kitchens, stairwells, and entrances to home.  5. Use night-lights near bed, in hallways, and in bathroom to improve night safety.  6. Install safety handrails in shower, tub, and around toilet.  Bathtubs and shower stalls should have non-skid surfaces.  7. When you must reach for something high, use a safety step stool, one with wide steps and a friction surface to stand on.  A type equipped with a high handrail is preferred.  8. If a cane or other walking aid has been recommended, use it to help increase your stability.  9. Wear supportive, cushioned, low-heeled shoes.  Avoid "scuffs" (backless bedroom slippers) and high heels.  10. Avoid rushing to answer a phone, doorbell, or anything else!  A portable phone that you can take from room to room with you is a good idea for security and safety.  11. Exercise regularly and stay active!!    Resources  National Osteoporosis Foundation www.NOF.org   Exercise for Osteoporosis; A Safe and Effective Way to Build Bone Density and Muscle Strength By: Dianne Daniels, M.A.    [facility] [address], [address] [place], [place] [address] Phone # [phone] Fax [phone]  

## 2015-06-24 NOTE — Therapy (Addendum)
[facility] [address] [place], [place], [address] Phone: [phone]   Fax:  [phone]  Physical Therapy Treatment  Patient Details  Name: [name] [name] MRN: [id number] Date of Birth: 1931-07-22 Referring Provider: Dr. Burnard Bunting  Encounter Date: 06/24/2015      PT End of Session - 06/24/15 1154    Visit Number 2   Number of Visits 10  Medicare   Date for PT Re-Evaluation 08/12/15   PT Start Time 0962   PT Stop Time 1225   PT Time Calculation (min) 40 min   Activity Tolerance Patient tolerated treatment well   Behavior During Therapy [facility] for tasks assessed/performed      Past Medical History  Diagnosis Date  . DDD (degenerative disc disease)   . Osteopenia   . GERD (gastroesophageal reflux disease)   . Hypertension   . Hyperlipidemia   . Vertigo, benign positional   . Hypothyroidism   . Anxiety   . H/O hiatal hernia   . Anemia   . Eczema   . Septicemia ([place]) 2002    following UTI  . Chronotropic incompetence 12/2012     potentially medication related; noted on CPET     Past Surgical History  Procedure Laterality Date  . Dilation and curettage of uterus      x 2  . Breast surgery      left biopsy  . Knee arthroscopy  2011    right  . Tonsillectomy    . Eye surgery      cataract extraction with ILO  ? eye  . Total knee arthroplasty  04/24/2012    Procedure: TOTAL KNEE ARTHROPLASTY;  Surgeon: Gearlean Alf, MD;  Location: [facility];  Service: Orthopedics;  Laterality: Right;  . Esophagogastroduodenoscopy  05/04/2012    Procedure: ESOPHAGOGASTRODUODENOSCOPY (EGD);  Surgeon: Inda Castle, MD;  Location: [facility];  Service: Endoscopy;  Laterality: N/A;  . Doppler echocardiography  01/10/2013    Normal LV size and function. Normal EF. Air sclerosis but no stenosis  . Cpet / met - pfts      Consistent with chronotropic incompetence; See attached report in the results section     There were no vitals filed for this visit.  Visit Diagnosis:  Left-sided thoracic back pain  Difficulty walking      Subjective Assessment - 06/24/15 1154    Subjective I am having trouble doing the HEP.  I have Tramoadol in me now.    Pertinent History stenosis, bulging discs lumbar spine, and coccyx dislocation 25 years ago.  2 compression fractures on the L side, osteoporosis   Diagnostic tests xrays showing compression fractures   Patient Stated Goals help with the balance, decrease the back pain   Currently in Pain? Yes   Pain Score 10-Worst pain ever   Pain Location Back   Pain Orientation Left   Pain Descriptors / Indicators Aching;Burning   Pain Type Acute pain   Pain Frequency Intermittent   Aggravating Factors  standing,    Pain Relieving Factors medication and heat            [facility] PT Assessment - 06/24/15 0001    Timed Up and Go Test   TUG Normal TUG   Normal TUG (seconds) 37   TUG Comments used a single point cane      G-code: Functional assessment tool used FOTO 60% limitation.  Functional limitation carrying, moving and handling objects. Goal status is CK.  Discharge status is CL due to patient not returning after the second visit.                [facility] Adult PT Treatment/Exercise - 06/24/15 0001    Self-Care   Self-Care Other Self-Care Comments  information on osteoporosis   Therapeutic Activites    Therapeutic Activities Other Therapeutic Activities;ADL's   ADL's bending at hips instead of trunk,    Lumbar Exercises: Aerobic   Elliptical 5 min, level 1, arm #8 Seat #9   Shoulder Exercises: Seated   Horizontal ABduction Left;Both;10 reps  one arm at a time   Flexion Left;Right;10 reps  alternate arms   Neck Exercises: Stretches   Other Neck Stretches seated chin retraction with chest lift to work on postural muscles hold 5 sec 10x                PT Education - 06/24/15 1219    Education provided Yes   Education Details  information on osteoporosis, correct body mechanics   Person(s) Educated Patient   Methods Explanation;Verbal cues;Handout   Comprehension Verbalized understanding;Returned demonstration          PT Short Term Goals - 06/24/15 1201    PT SHORT TERM GOAL #1   Title pt will decrease resting thoracic pain to 6/10 without tramadol   Time 3   Period Weeks   Status On-going  pain level 10/104   PT SHORT TERM GOAL #2   Title balance will be assessed with appropriate goal written   Time 3   Period Weeks   Status Achieved           PT Long Term Goals - 06/24/15 1202    PT LONG TERM GOAL #4   Title TUG score with single pont cane </= 20 sec to decrease chance of falling   Time 8   Period Weeks   Status New               Plan - 06/24/15 1316    Clinical Impression Statement Patient has not met goals due to just starting therapy.  Patient was educated on body mechanics and osteoporosis.  Patient was educated on tips to prevent falls. TUG was 37 sec with walking with a cane.  A new goal for balance was made.    Pt will benefit from skilled therapeutic intervention in order to improve on the following deficits Abnormal gait;Decreased activity tolerance;Decreased knowledge of precautions;Decreased strength;Difficulty walking;Postural dysfunction;Pain   Rehab Potential Good   Clinical Impairments Affecting Rehab Potential compression fractures in lumbar spine   PT Frequency 2x / week   PT Duration 8 weeks   PT Treatment/Interventions ADLs/Self Care Home Management;Electrical Stimulation;Moist Heat;Therapeutic activities;Therapeutic exercise;Patient/family education;Manual techniques   PT Next Visit Plan decreased L sided thoracic pain; STM, stretches(avoiding flexion), modalities,   balance exercises   PT Home Exercise Plan psoture, gentle back strength   Recommended Other Services None   Consulted and Agree with Plan of Care Patient        Problem List Patient Active  Problem List   Diagnosis Date Noted  . Exertional dyspnea 09/29/2013  . Essential hypertension 01/29/2013  . Fatigue with decreased exercise tolerance 12/25/2012  . Chronotropic incompetence 12/24/2012  . Aortic ejection murmur 12/22/2012  . Esophagitis 05/04/2012  . GI bleed 05/03/2012  . Anemia 05/03/2012  . Postop Transfusion 04/27/2012  . OA (osteoarthritis) of knee 04/24/2012    GRAY,CHERYL,PT 06/24/2015, 1:19 PM  [facility] [address], [place], [place], [address] Phone: [phone]   Fax:  [phone]  Name: Latorria Zeoli Ferrara MRN: [id number] Date of Birth: 1931/02/28    PHYSICAL THERAPY DISCHARGE SUMMARY  Visits from Start of Care: 2  Current functional level related to goals / functional outcomes: See above.  Patient did not return after her last appointment on 06/24/2015 and has not rescheduled any further visits.    Remaining deficits: See above.   Education / Equipment: HEP  Plan: Patient agrees to discharge.  Patient goals were not met. Patient is being discharged due to not returning since the last visit. Thank you for the referral. Earlie Counts, PT 09/15/2015 3:39 PM   ?????

## 2015-07-01 ENCOUNTER — Other Ambulatory Visit: Payer: Self-pay | Admitting: Cardiology

## 2015-07-01 ENCOUNTER — Other Ambulatory Visit: Payer: Self-pay | Admitting: Internal Medicine

## 2015-07-01 ENCOUNTER — Encounter: Payer: Medicare Other | Admitting: Rehabilitation

## 2015-07-01 DIAGNOSIS — IMO0002 Reserved for concepts with insufficient information to code with codable children: Secondary | ICD-10-CM

## 2015-07-01 NOTE — Telephone Encounter (Signed)
Rx(s) sent to pharmacy electronically.  

## 2015-07-02 ENCOUNTER — Ambulatory Visit
Admission: RE | Admit: 2015-07-02 | Discharge: 2015-07-02 | Disposition: A | Discharge status: Home / Self Care | Payer: Medicare Other | Source: Ambulatory Visit | Attending: Internal Medicine | Admitting: Internal Medicine

## 2015-07-02 DIAGNOSIS — IMO0002 Reserved for concepts with insufficient information to code with codable children: Secondary | ICD-10-CM

## 2015-07-08 ENCOUNTER — Encounter: Payer: Medicare Other | Admitting: Rehabilitation

## 2015-07-09 ENCOUNTER — Other Ambulatory Visit (HOSPITAL_COMMUNITY): Payer: Self-pay | Admitting: Interventional Radiology

## 2015-07-09 DIAGNOSIS — IMO0002 Reserved for concepts with insufficient information to code with codable children: Secondary | ICD-10-CM

## 2015-07-09 DIAGNOSIS — M549 Dorsalgia, unspecified: Secondary | ICD-10-CM

## 2015-07-10 ENCOUNTER — Encounter: Payer: Medicare Other | Admitting: Physical Therapy

## 2015-07-14 ENCOUNTER — Ambulatory Visit: Payer: Medicare Other | Admitting: Physical Therapy

## 2015-07-17 ENCOUNTER — Other Ambulatory Visit: Payer: Self-pay | Admitting: Radiology

## 2015-07-17 ENCOUNTER — Encounter: Payer: Medicare Other | Admitting: Rehabilitation

## 2015-07-18 ENCOUNTER — Other Ambulatory Visit (HOSPITAL_COMMUNITY): Payer: Self-pay | Admitting: Interventional Radiology

## 2015-07-18 ENCOUNTER — Ambulatory Visit (HOSPITAL_COMMUNITY)
Admission: RE | Admit: 2015-07-18 | Discharge: 2015-07-18 | Disposition: A | Discharge status: Home / Self Care | Payer: Medicare Other | Source: Ambulatory Visit | Attending: Interventional Radiology | Admitting: Interventional Radiology

## 2015-07-18 DIAGNOSIS — K219 Gastro-esophageal reflux disease without esophagitis: Secondary | ICD-10-CM | POA: Diagnosis not present

## 2015-07-18 DIAGNOSIS — Z9889 Other specified postprocedural states: Secondary | ICD-10-CM | POA: Diagnosis not present

## 2015-07-18 DIAGNOSIS — M549 Dorsalgia, unspecified: Secondary | ICD-10-CM

## 2015-07-18 DIAGNOSIS — IMO0002 Reserved for concepts with insufficient information to code with codable children: Secondary | ICD-10-CM

## 2015-07-18 DIAGNOSIS — I10 Essential (primary) hypertension: Secondary | ICD-10-CM | POA: Insufficient documentation

## 2015-07-18 DIAGNOSIS — Z7982 Long term (current) use of aspirin: Secondary | ICD-10-CM | POA: Diagnosis not present

## 2015-07-18 DIAGNOSIS — X58XXXA Exposure to other specified factors, initial encounter: Secondary | ICD-10-CM | POA: Diagnosis not present

## 2015-07-18 DIAGNOSIS — Z79899 Other long term (current) drug therapy: Secondary | ICD-10-CM | POA: Insufficient documentation

## 2015-07-18 DIAGNOSIS — M8580 Other specified disorders of bone density and structure, unspecified site: Secondary | ICD-10-CM | POA: Insufficient documentation

## 2015-07-18 DIAGNOSIS — F419 Anxiety disorder, unspecified: Secondary | ICD-10-CM | POA: Insufficient documentation

## 2015-07-18 DIAGNOSIS — E785 Hyperlipidemia, unspecified: Secondary | ICD-10-CM | POA: Diagnosis not present

## 2015-07-18 DIAGNOSIS — M4854XA Collapsed vertebra, not elsewhere classified, thoracic region, initial encounter for fracture: Secondary | ICD-10-CM | POA: Diagnosis not present

## 2015-07-18 LAB — CBC WITH DIFFERENTIAL/PLATELET
BASOS PCT: 0 %
Basophils Absolute: 0 10*3/uL (ref 0.0–0.1)
Eosinophils Absolute: 0 10*3/uL (ref 0.0–0.7)
Eosinophils Relative: 1 %
HEMATOCRIT: 36.3 % (ref 36.0–46.0)
Hemoglobin: 12.2 g/dL (ref 12.0–15.0)
LYMPHS PCT: 28 %
Lymphs Abs: 1.2 10*3/uL (ref 0.7–4.0)
MCH: 29.9 pg (ref 26.0–34.0)
MCHC: 33.6 g/dL (ref 30.0–36.0)
MCV: 89 fL (ref 78.0–100.0)
MONO ABS: 1 10*3/uL (ref 0.1–1.0)
MONOS PCT: 21 %
NEUTROS ABS: 2.3 10*3/uL (ref 1.7–7.7)
NEUTROS PCT: 50 %
Platelets: 96 10*3/uL — ABNORMAL LOW (ref 150–400)
RBC: 4.08 MIL/uL (ref 3.87–5.11)
RDW: 16.9 % — AB (ref 11.5–15.5)
WBC: 4.5 10*3/uL (ref 4.0–10.5)

## 2015-07-18 LAB — BASIC METABOLIC PANEL
Anion gap: 11 (ref 5–15)
BUN: 12 mg/dL (ref 6–20)
CHLORIDE: 98 mmol/L — AB (ref 101–111)
CO2: 24 mmol/L (ref 22–32)
Calcium: 9.8 mg/dL (ref 8.9–10.3)
Creatinine, Ser: 0.68 mg/dL (ref 0.44–1.00)
GFR calc non Af Amer: >60 mL/min (ref >60)
Glucose, Bld: 125 mg/dL — ABNORMAL HIGH (ref 65–99)
POTASSIUM: 4.5 mmol/L (ref 3.5–5.1)
SODIUM: 133 mmol/L — AB (ref 135–145)

## 2015-07-18 LAB — PROTIME-INR
INR: 0.97 (ref 0.00–1.49)
Prothrombin Time: 13.1 seconds (ref 11.6–15.2)

## 2015-07-18 MED ORDER — CEFAZOLIN SODIUM-DEXTROSE 2-3 GM-% IV SOLR
INTRAVENOUS | Status: AC
Start: 2015-07-18 — End: 2015-07-18
  Filled 2015-07-18: qty 50

## 2015-07-18 MED ORDER — CEFAZOLIN SODIUM-DEXTROSE 2-3 GM-% IV SOLR
2.0000 g | INTRAVENOUS | Status: AC
  Administered 2015-07-18: 2 g via INTRAVENOUS

## 2015-07-18 MED ORDER — MIDAZOLAM HCL 2 MG/2ML IJ SOLN
INTRAMUSCULAR | Status: AC | PRN
  Administered 2015-07-18 (×2): 1 mg via INTRAVENOUS

## 2015-07-18 MED ORDER — MIDAZOLAM HCL 2 MG/2ML IJ SOLN
INTRAMUSCULAR | Status: AC
  Filled 2015-07-18: qty 6

## 2015-07-18 MED ORDER — BUPIVACAINE HCL (PF) 0.25 % IJ SOLN
INTRAMUSCULAR | Status: AC
  Filled 2015-07-18: qty 30

## 2015-07-18 MED ORDER — FENTANYL CITRATE (PF) 100 MCG/2ML IJ SOLN
INTRAMUSCULAR | Status: AC | PRN
  Administered 2015-07-18 (×2): 25 ug via INTRAVENOUS

## 2015-07-18 MED ORDER — SODIUM CHLORIDE 0.9 % IV SOLN
INTRAVENOUS | Status: DC
  Administered 2015-07-18: 09:00:00 via INTRAVENOUS

## 2015-07-18 MED ORDER — IOHEXOL 300 MG/ML  SOLN
50.0000 mL | Freq: Once | INTRAMUSCULAR | Status: AC | PRN
  Administered 2015-07-18: 10 mL via INTRAVENOUS

## 2015-07-18 MED ORDER — SODIUM CHLORIDE 0.9 % IV SOLN: INTRAVENOUS | Status: DC

## 2015-07-18 MED ORDER — HYDROMORPHONE HCL 1 MG/ML IJ SOLN
INTRAMUSCULAR | Status: AC
  Filled 2015-07-18: qty 2

## 2015-07-18 MED ORDER — FENTANYL CITRATE (PF) 100 MCG/2ML IJ SOLN
INTRAMUSCULAR | Status: AC
  Filled 2015-07-18: qty 4

## 2015-07-18 MED ORDER — TOBRAMYCIN SULFATE 1.2 G IJ SOLR
INTRAMUSCULAR | Status: AC
  Filled 2015-07-18: qty 1.2

## 2015-07-18 NOTE — Sedation Documentation (Signed)
Denies pain

## 2015-07-18 NOTE — H&P (Signed)
Chief Complaint: Back pain  HPI: [name] [name] is an 81 y.o. female with back pain. MRI finds acute compression fractures of T8 and T9. She is scheduled for eval and KP procedure to follow. Pt describes mid-back pain, worse with movement. No associated neuropathies. Has been NPO this morning. No acute illness, fevers, chills. PMHx, meds, labs, imaging reviewed.  Past Medical History:  Past Medical History  Diagnosis Date  . DDD (degenerative disc disease)   . Osteopenia   . GERD (gastroesophageal reflux disease)   . Hypertension   . Hyperlipidemia   . Vertigo, benign positional   . Hypothyroidism   . Anxiety   . H/O hiatal hernia   . Anemia   . Eczema   . Septicemia ([place]) 2002    following UTI  . Chronotropic incompetence 12/2012     potentially medication related; noted on CPET     Past Surgical History:  Past Surgical History  Procedure Laterality Date  . Dilation and curettage of uterus      x 2  . Breast surgery      left biopsy  . Knee arthroscopy  2011    right  . Tonsillectomy    . Eye surgery      cataract extraction with ILO  ? eye  . Total knee arthroplasty  04/24/2012    Procedure: TOTAL KNEE ARTHROPLASTY;  Surgeon: Gearlean Alf, MD;  Location: [facility];  Service: Orthopedics;  Laterality: Right;  . Esophagogastroduodenoscopy  05/04/2012    Procedure: ESOPHAGOGASTRODUODENOSCOPY (EGD);  Surgeon: Inda Castle, MD;  Location: [facility];  Service: Endoscopy;  Laterality: N/A;  . Doppler echocardiography  01/10/2013    Normal LV size and function. Normal EF. Air sclerosis but no stenosis  . Cpet / met - pfts      Consistent with chronotropic incompetence; See attached report in the results section    Family History:  Family History  Problem Relation Age of Onset  . Lung cancer Father 36    Died at age 102  . Stroke Mother 23    Died in her late 58s.  Marland Kitchen Heart attack Mother 22    Social History:  reports that she quit smoking about 50 years ago.  Her smoking use included Cigarettes. She quit after 0 years of use. She has never used smokeless tobacco. She reports that she does not drink alcohol or use illicit drugs.  Allergies: No Known Allergies  Medications:   Medication List    ASK your doctor about these medications        acetaminophen 325 MG tablet  Commonly known as:  TYLENOL  Take 650 mg by mouth every 6 (six) hours as needed for mild pain.     amLODipine 10 MG tablet  Commonly known as:  NORVASC  Take 1 tablet (10 mg total) by mouth daily.     aspirin EC 81 MG tablet  Take 81 mg by mouth daily.     calcium carbonate 600 MG Tabs tablet  Commonly known as:  OS-CAL  Take 600 mg by mouth 2 (two) times daily with a meal.     cholecalciferol 1000 UNITS tablet  Commonly known as:  VITAMIN D  Take 1,000 Units by mouth daily.     diphenhydramine-acetaminophen 25-500 MG Tabs tablet  Commonly known as:  TYLENOL PM  Take 1 tablet by mouth at bedtime as needed (for sleep).     fish oil-omega-3 fatty acids 1000 MG capsule  Take 1  g by mouth daily.     levothyroxine 50 MCG tablet  Commonly known as:  SYNTHROID, LEVOTHROID  Take 50 mcg by mouth daily before breakfast.     losartan 100 MG tablet  Commonly known as:  COZAAR  Take 1 tablet by mouth  daily     MULTIVITAMIN PO  Take 1 tablet by mouth daily.     omeprazole 20 MG capsule  Commonly known as:  PRILOSEC  Take 20 mg by mouth daily.        Please HPI for pertinent positives, otherwise complete 10 system ROS negative.  Physical Exam: BP 172/66 mmHg  Pulse 85  Temp(Src) 98.4 F (36.9 C) (Oral)  Ht _0  (1.651 m)  Wt 160 lb (72.576 kg)  BMI 26.63 kg/m2  SpO2 96% Body mass index is 26.63 kg/(m^2).   General Appearance:  Alert, cooperative, no distress, appears stated age  Head:  Normocephalic, without obvious abnormality, atraumatic  ENT: Unremarkable  Neck: Supple, symmetrical, trachea midline  Lungs:   Clear to auscultation bilaterally, no  w/r/r, respirations unlabored without use of accessory muscles.  Back:  Tender lower thoracic region, no visible defects  Heart:  Regular rate and rhythm, S1, S2 normal, no murmur, rub or gallop.  Abdomen:   Soft, non-tender, non distended.  Extremities: Extremities normal, atraumatic, no cyanosis or edema  Pulses: 2+ and symmetric  Neurologic: Normal affect, no gross deficits.  Labs: Results for orders placed or performed during the hospital encounter of 07/18/15 (from the past 48 hour(s))  Basic metabolic panel     Status: Abnormal   Collection Time: 07/18/15  8:30 AM  Result Value Ref Range   Sodium 133 (L) 135 - 145 mmol/L   Potassium 4.5 3.5 - 5.1 mmol/L   Chloride 98 (L) 101 - 111 mmol/L   CO2 24 22 - 32 mmol/L   Glucose, Bld 125 (H) 65 - 99 mg/dL   BUN 12 6 - 20 mg/dL   Creatinine, Ser 0.68 0.44 - 1.00 mg/dL   Calcium 9.8 8.9 - 10.3 mg/dL   GFR calc non Af Amer >60 >60 mL/min   GFR calc Af Amer >60 >60 mL/min    Comment: (NOTE) The eGFR has been calculated using the CKD EPI equation. This calculation has not been validated in all clinical situations. eGFR's persistently <60 mL/min signify possible Chronic Kidney Disease.    Anion gap 11 5 - 15  CBC with Differential/Platelet     Status: Abnormal   Collection Time: 07/18/15  8:30 AM  Result Value Ref Range   WBC 4.5 4.0 - 10.5 K/uL    Comment: REPEATED TO VERIFY   RBC 4.08 3.87 - 5.11 MIL/uL   Hemoglobin 12.2 12.0 - 15.0 g/dL    Comment: REPEATED TO VERIFY   HCT 36.3 36.0 - 46.0 %   MCV 89.0 78.0 - 100.0 fL   MCH 29.9 26.0 - 34.0 pg   MCHC 33.6 30.0 - 36.0 g/dL   RDW 16.9 (H) 11.5 - 15.5 %   Platelets 96 (L) 150 - 400 K/uL    Comment: PLATELET COUNT CONFIRMED BY SMEAR   Neutrophils Relative % 50 %   Neutro Abs 2.3 1.7 - 7.7 K/uL   Lymphocytes Relative 28 %   Lymphs Abs 1.2 0.7 - 4.0 K/uL   Monocytes Relative 21 %   Monocytes Absolute 1.0 0.1 - 1.0 K/uL   Eosinophils Relative 1 %   Eosinophils Absolute 0.0 0.0  - 0.7 K/uL  Basophils Relative 0 %   Basophils Absolute 0.0 0.0 - 0.1 K/uL  Protime-INR     Status: None   Collection Time: 07/18/15  8:30 AM  Result Value Ref Range   Prothrombin Time 13.1 11.6 - 15.2 seconds   INR 0.97 0.00 - 1.49    Imaging: No results found.  Assessment/Plan T8 and T9 compression fractures For VP/KP Labs reviewed, ok Risks and Benefits discussed with the patient including, but not limited to education regarding the natural healing process of compression fractures without intervention, bleeding, infection, cement migration which may cause spinal cord damage, paralysis, pulmonary embolism or even death. All of the patient's questions were answered, patient is agreeable to proceed. Consent signed and in chart.     Ascencion Dike PA-C 07/18/2015, 10:02 AM

## 2015-07-18 NOTE — Procedures (Signed)
S/P T 9 VP 

## 2015-07-18 NOTE — Discharge Instructions (Signed)
1.No stooping,bending or lifting more than 10 lb  2.Use walker to ambulate for 2 weeks. 3.RTC in 2 weeks   KYPHOPLASTY/VERTEBROPLASTY DISCHARGE INSTRUCTIONS  Medications: (check all that apply)     Resume all home medications as before procedure.             Continue your pain medications as prescribed as needed.  Over the next 3-5 days, decrease your pain medication as tolerated.  Over the counter medications (i.e. Tylenol, ibuprofen, and aleve) may be substituted once severe/moderate pain symptoms have subsided.   Wound Care: - Bandages may be removed the day following your procedure.  You may get your incision wet once bandages are removed.  Bandaids may be used to cover the incisions until scab formation.  Topical ointments are optional.  - If you develop a fever greater than 101 degrees, have increased skin redness at the incision sites or pus-like oozing from incisions occurring within 1 week of the procedure, contact radiology at [phone] or [phone].  - Ice pack to back for 15-20 minutes 2-3 time per day for first 2-3 days post procedure.  The ice will expedite muscle healing and help with the pain from the incisions.   Activity: - Bedrest today with limited activity for 24 hours post procedure.  - No driving for 48 hours.  - Increase your activity as tolerated after bedrest (with assistance if necessary).  - Refrain from any strenuous activity or heavy lifting (greater than 10 lbs.).   Follow up: - Contact radiology at [phone] or [phone] if any questions/concerns.  - A physician assistant from radiology will contact you in approximately 1 week.  - If a biopsy was performed at the time of your procedure, your referring physician should receive the results in usually 2-3 days.

## 2015-07-18 NOTE — Sedation Documentation (Signed)
Dsg to back x2 intact

## 2015-07-22 ENCOUNTER — Other Ambulatory Visit (HOSPITAL_COMMUNITY): Payer: Self-pay | Admitting: Interventional Radiology

## 2015-07-22 ENCOUNTER — Encounter: Payer: Medicare Other | Admitting: Physical Therapy

## 2015-07-23 NOTE — Addendum Note (Signed)
Encounter addended by: Claudean KindsKristin G Jaylaa Gallion, RN on: 07/23/2015  2:07 PM<BR>     Documentation filed: Inpatient MAR

## 2015-07-24 ENCOUNTER — Encounter: Payer: Medicare Other | Admitting: Physical Therapy

## 2015-08-14 ENCOUNTER — Telehealth (HOSPITAL_COMMUNITY): Payer: Self-pay

## 2015-08-14 NOTE — Telephone Encounter (Signed)
Pt stated that she was feeling better and did not need to come in for a f/u appt with Dr. Corliss Skains. I told her if anything changes to give [place] a call and we can get back in for an appt. AW

## 2015-08-26 ENCOUNTER — Other Ambulatory Visit (HOSPITAL_COMMUNITY): Payer: Self-pay | Admitting: Interventional Radiology

## 2015-08-26 DIAGNOSIS — IMO0002 Reserved for concepts with insufficient information to code with codable children: Secondary | ICD-10-CM

## 2015-09-09 ENCOUNTER — Ambulatory Visit (HOSPITAL_COMMUNITY): Admission: RE | Admit: 2015-09-09 | Payer: Medicare Other | Source: Ambulatory Visit

## 2015-12-03 ENCOUNTER — Other Ambulatory Visit: Payer: Self-pay | Admitting: Cardiology

## 2015-12-03 NOTE — Telephone Encounter (Signed)
Rx has been sent to the pharmacy electronically. ° °

## 2016-01-12 ENCOUNTER — Other Ambulatory Visit: Payer: Self-pay | Admitting: Internal Medicine

## 2016-03-18 ENCOUNTER — Encounter (HOSPITAL_COMMUNITY): Payer: Medicare Other

## 2016-04-30 ENCOUNTER — Ambulatory Visit (INDEPENDENT_AMBULATORY_CARE_PROVIDER_SITE_OTHER): Payer: Medicare Other | Admitting: Cardiology

## 2016-04-30 ENCOUNTER — Encounter: Payer: Self-pay | Admitting: Cardiology

## 2016-04-30 VITALS — BP 152/65 | HR 61 | Ht 65.5 in | Wt 151.0 lb

## 2016-04-30 DIAGNOSIS — I351 Nonrheumatic aortic (valve) insufficiency: Secondary | ICD-10-CM | POA: Diagnosis not present

## 2016-04-30 DIAGNOSIS — I4589 Other specified conduction disorders: Secondary | ICD-10-CM | POA: Diagnosis not present

## 2016-04-30 DIAGNOSIS — I10 Essential (primary) hypertension: Secondary | ICD-10-CM | POA: Diagnosis not present

## 2016-04-30 DIAGNOSIS — R600 Localized edema: Secondary | ICD-10-CM

## 2016-04-30 DIAGNOSIS — R5383 Other fatigue: Secondary | ICD-10-CM

## 2016-04-30 DIAGNOSIS — I119 Hypertensive heart disease without heart failure: Secondary | ICD-10-CM

## 2016-04-30 DIAGNOSIS — R0609 Other forms of dyspnea: Secondary | ICD-10-CM

## 2016-04-30 MED ORDER — FUROSEMIDE 20 MG PO TABS: 20.0000 mg | ORAL_TABLET | ORAL | 3 refills | Status: DC | PRN

## 2016-04-30 NOTE — Patient Instructions (Addendum)
  PURCHASE KNEE HI SUPPORT STOCKING -- MAY FIND THEM AT PHARMACY OR DEPARTMENT STORE LIKE [facility] OR [facility]  START GOING TO WATER EXERCISES  MAY USE LASIX ( FUROSEMIDE) 20 MG  AS NEEDED FOR INCREASED SWELLING - ONE TABLET DAILY    Your physician wants you to follow-up in: 12  MONTH WITH DR HARDING  -30 MINYou will receive a reminder letter in the mail two months in advance. If you don't receive a letter, please call our office to schedule the follow-up appointment.   If you need a refill on your cardiac medications before your next appointment, please call your pharmacy.

## 2016-04-30 NOTE — Progress Notes (Signed)
PCP: Geoffery Lyons, MD  Clinic Note: Chief Complaint  Patient presents with  . Follow-up    Shortness of breath with hypertensive heart disease    HPI: [name] [name] is a 82 y.o. female with a PMH below who presents today for yearly f/u of HTN - ?Chronotropic Incompetence.[name] [name] was last seen on in Oct 2015 - Myoview ordered  Recent Hospitalizations: 03/24/15 o ER for fall - broke her rib; lost her footing walking out her back door - lost her balance.  Studies Reviewed:  Myoview 05/01/14: NORMAL LV Function/Wall Motion. Normal/LOW RISK Myocardial Perfusion Study -  EF ~68%. Only walked 3 min 22 Sec. Peak HR 139 bpm   Interval History: [name] [name] actually presents today not really with many complaints.  The issue with her is that she has been having more balance issues with walking. Usually she "gives out of energy" from trying to stay balanced as far as walking from one place to another rather than dyspnea. She states that her PCP has sent her back because he doesn't think that her symptoms are related to COPD. [name] [name] seems a little bit more nervous than usual, but admits to feeling nervous with several symptoms. She still has exertional shortness of breath, but is not really noticing any chest tightness or pressure. He still has a history of left-sided dizziness however. She has strengths chest discomfort episodes but are not necessarily related to any particular activity. She gets short of breath and dizzy sometimes with exerting herself, but also notes that her balance is been off a bit. She describes it as a breathlessness sensation. A lot of the reason why it's hard for her to walk is related to balance issues. She has to focus really hard to maintain her balance. She notes ankle and lower extremity swelling this worse at the end of the day. This is associated with a heaviness sensation in her legs.  A lot of her anxiety and nervousness seems to be stemming around the PEG she  is now moved into friends home and has been trying to sell her house. Hopefully once her house sold enclosed on, she revealed to then relax.  No heart failure symptoms of PND, orthopnea or edema.  She has rare palpitations but they're not associated with lightheadedness, dizziness, weakness or syncope/near syncope. No TIA/amaurosis fugax symptoms.  ROS: A comprehensive was performed. Review of Systems  Constitutional: Negative for malaise/fatigue (Overall her energy is improved when doing more walking).  Respiratory: Positive for shortness of breath (If she overdoes it).   Cardiovascular: Negative for claudication.  Gastrointestinal: Negative for blood in stool and melena.  Genitourinary: Negative for hematuria.  Musculoskeletal: Positive for falls (She had a recent fall and broke a few ribs on the right side.).  Neurological: Negative for headaches.       Poor balance  Endo/Heme/Allergies: Does not bruise/bleed easily.  Psychiatric/Behavioral: Negative.   All other systems reviewed and are negative.   Past Medical History:  Diagnosis Date  . Anemia   . Anxiety   . Chronotropic incompetence 12/2012    potentially medication related; noted on CPET   . DDD (degenerative disc disease)   . Eczema   . GERD (gastroesophageal reflux disease)   . H/O hiatal hernia   . Hyperlipidemia   . Hypertension   . Hypothyroidism   . Osteopenia   . Septicemia ([place]) 2002   following UTI  . Vertigo, benign positional     Past  Surgical History:  Procedure Laterality Date  . BREAST SURGERY     left biopsy  . CPET / MET - PFTS     Consistent with chronotropic incompetence; See attached report in the results section  . DILATION AND CURETTAGE OF UTERUS     x 2  . DOPPLER ECHOCARDIOGRAPHY  01/10/2013   Normal LV size and function. Normal EF. Air sclerosis but no stenosis  . ESOPHAGOGASTRODUODENOSCOPY  05/04/2012   Procedure: ESOPHAGOGASTRODUODENOSCOPY (EGD);  Surgeon: Inda Castle, MD;   Location: [facility];  Service: Endoscopy;  Laterality: N/A;  . EYE SURGERY     cataract extraction with ILO  ? eye  . KNEE ARTHROSCOPY  2011   right  . TONSILLECTOMY    . TOTAL KNEE ARTHROPLASTY  04/24/2012   Procedure: TOTAL KNEE ARTHROPLASTY;  Surgeon: Gearlean Alf, MD;  Location: [facility];  Service: Orthopedics;  Laterality: Right;   Prior to Admission medications   Medication Sig Start Date End Date Taking? Authorizing Provider  acetaminophen (TYLENOL) 325 MG tablet Take 650 mg by mouth every 6 (six) hours as needed for mild pain.    Yes Historical Provider, MD  amLODipine (NORVASC) 10 MG tablet Take 1 tablet (10 mg total) by mouth daily. 05/08/12  Yes Ripudeep Krystal Eaton, MD  aspirin EC 81 MG tablet Take 81 mg by mouth daily.   Yes Historical Provider, MD  calcium carbonate (OS-CAL) 600 MG TABS Take 600 mg by mouth 2 (two) times daily with a meal.   Yes Historical Provider, MD  cholecalciferol (VITAMIN D) 1000 UNITS tablet Take 1,000 Units by mouth daily.   Yes Historical Provider, MD  diphenhydramine-acetaminophen (TYLENOL PM) 25-500 MG TABS Take 1 tablet by mouth at bedtime as needed (for sleep).   Yes Historical Provider, MD  fish oil-omega-3 fatty acids 1000 MG capsule Take 1 g by mouth daily.   Yes Historical Provider, MD  levothyroxine (SYNTHROID, LEVOTHROID) 50 MCG tablet Take 50 mcg by mouth daily before breakfast.   Yes Historical Provider, MD  losartan (COZAAR) 100 MG tablet Take 1 tablet by mouth  daily 12/03/15  Yes Leonie Man, MD  Multiple Vitamins-Minerals (MULTIVITAMIN PO) Take 1 tablet by mouth daily.   Yes Historical Provider, MD  omeprazole (PRILOSEC) 20 MG capsule Take 20 mg by mouth daily.   Yes Historical Provider, MD    No Known Allergies   Social History   Social History  . Marital status: Widowed    Spouse name: N/A  . Number of children: 2  . Years of education: N/A   Social History Main Topics  . Smoking status: Former Smoker    Years: 0.00     Types: Cigarettes    Quit date: 05/03/1965  . Smokeless tobacco: Never Used  . Alcohol use No     Comment: occ glass wine  . Drug use: No  . Sexual activity: Not Asked   Other Topics Concern  . None   Social History Narrative   She is a widowed mother of 2.   She is a former smoker, with a distant history having quit in 1966.   She does drink an occasional glass of red wine.   She is just now started to try doing exercise with water aerobics.   Family History  Problem Relation Age of Onset  . Lung cancer Father 53    Died at age 51  . Stroke Mother 80    Died in her late 55s.  Marland Kitchen  Heart attack Mother 5    Wt Readings from Last 3 Encounters:  04/30/16 68.5 kg (151 lb)  07/18/15 72.6 kg (160 lb)  04/28/15 71.6 kg (157 lb 12.8 oz)    PHYSICAL EXAM BP (!) 152/65   Pulse 61   Ht 5' 5.5" (1.664 m)   Wt 68.5 kg (151 lb)   BMI 24.75 kg/m  General appearance: A&O x3, NAD. Pleasant mood and affect. Cooperative, appears stated age  74: [place]/AT, EOMI, MMM, anicteric sclera Neck: no adenopathy, no carotid bruit and no JVD  Lungs: clear to auscultation bilaterally, normal percussion bilaterally and non-labored  Heart: regular rate and rhythm, S1, S2 normal, no click, rub or gallop, nondisplaced PMI. 1-2/6 crescendo-decrescendo 6 SEM at RUSB.  Abdomen: soft, non-tender; bowel sounds normal; no masses, no organomegaly  Extremities: extremities normal, atraumatic, no cyanosis or edema  Pulses: 2+ and symmetric;  Neurologic: Mental status: Alert, oriented, thought content appropriate; Cranial nerves: normal (II-XII grossly intact)    Adult ECG Report  Rate: 61 ;  Rhythm: normal sinus rhythm and Normal axis, intervals and durations.; cannot exclude septal infarct, age undetermined.  Narrative Interpretation: Stable EKG   Other studies Reviewed: Additional studies/ records that were reviewed today include:  Recent Labs:  No new labs available     ASSESSMENT /  PLAN: Problem List Items Addressed This Visit    Hypertensive heart disease (Chronic)    From a cardiac standpoint, so far her evaluation has been relatively negative for any potential macrovascular ischemic symptoms. She also has preserved EF. Cannot however exclude microvascular disease and hypertensive heart disease as an etiology. This is a difficult treatment choice because of her orthostatic dizziness and desire to allow for permissive hypertension as well as then treating her hypertension for hypertensive heart disease.  She is on high-dose ARB and amlodipine. Because of fatigue, she is not on beta blocker. She does has a mild edema, but I am leery of putting her on a diuretic. This may be a potential future if nothing else for an as needed treatment.      Relevant Medications   furosemide (LASIX) 20 MG tablet   Fatigue with decreased exercise tolerance (Chronic)    She did not appear to be chronotropic incompetent based on her stress test, probably more related to deconditioning. Nonischemic evaluation for similar symptoms in the past few years.  Recommend continued gradual exercise. Would avoid AV nodal agents.      Exertional dyspnea (Chronic)    Continues to be present. Probably multifactorial. Consider hypertensive heart disease versus a deconditioning.      Essential hypertension - Primary (Chronic)    Mostly because of her orthostatic dizziness, I'm leery of increasing medications at this time. Low threshold however for considering low-dose diuretic such as spironolactone or Lasix. Would prefer not to use HCTZ/chlorthalidone with her advanced age.      Relevant Medications   furosemide (LASIX) 20 MG tablet   Other Relevant Orders   EKG 12-Lead   Chronotropic incompetence (Chronic)   Bilateral leg edema    We will give her when necessary dose of Lasix 20 mg to use on a when necessary basis now. Also recommended using knee-high support stockings. Also recommend continued  exercise - try water exercises.      Relevant Orders   EKG 12-Lead   Aortic ejection murmur (Chronic)    Other Visit Diagnoses   None.     Current medicines are reviewed at length with the patient  today. (+/- concerns) none The following changes have been made: none PURCHASE KNEE HI SUPPORT STOCKING -- MAY FIND THEM AT PHARMACY OR DEPARTMENT STORE LIKE [facility] [facility] OR [facility]  START GOING TO WATER EXERCISES  MAY USE LASIX ( FUROSEMIDE) 20 MG  AS NEEDED FOR INCREASED SWELLING - ONE TABLET DAILY    Your physician wants you to follow-up in: 12  MONTH WITH DR Domenique Southers  -30 MIN  Studies Ordered:   Orders Placed This Encounter  Procedures  . EKG 12-Lead    ROV 1 year  Glenetta Hew, M.D., M.S. Interventional Cardiologist   Pager # [phone]

## 2016-05-02 ENCOUNTER — Encounter: Payer: Self-pay | Admitting: Cardiology

## 2016-05-02 DIAGNOSIS — I119 Hypertensive heart disease without heart failure: Secondary | ICD-10-CM | POA: Insufficient documentation

## 2016-05-02 NOTE — Assessment & Plan Note (Signed)
Mostly because of her orthostatic dizziness, I'm leery of increasing medications at this time. Low threshold however for considering low-dose diuretic such as spironolactone or Lasix. Would prefer not to use HCTZ/chlorthalidone with her advanced age.

## 2016-05-02 NOTE — Assessment & Plan Note (Signed)
Continues to be present. Probably multifactorial. Consider hypertensive heart disease versus a deconditioning.

## 2016-05-02 NOTE — Assessment & Plan Note (Signed)
We will give her when necessary dose of Lasix 20 mg to use on a when necessary basis now. Also recommended using knee-high support stockings. Also recommend continued exercise - try water exercises.

## 2016-05-02 NOTE — Assessment & Plan Note (Signed)
From a cardiac standpoint, so far her evaluation has been relatively negative for any potential macrovascular ischemic symptoms. She also has preserved EF. Cannot however exclude microvascular disease and hypertensive heart disease as an etiology. This is a difficult treatment choice because of her orthostatic dizziness and desire to allow for permissive hypertension as well as then treating her hypertension for hypertensive heart disease.  She is on high-dose ARB and amlodipine. Because of fatigue, she is not on beta blocker. She does has a mild edema, but I am leery of putting her on a diuretic. This may be a potential future if nothing else for an as needed treatment.

## 2016-05-02 NOTE — Assessment & Plan Note (Signed)
She did not appear to be chronotropic incompetent based on her stress test, probably more related to deconditioning. Nonischemic evaluation for similar symptoms in the past few years.  Recommend continued gradual exercise. Would avoid AV nodal agents.

## 2016-05-05 ENCOUNTER — Other Ambulatory Visit: Payer: Self-pay

## 2016-05-07 ENCOUNTER — Other Ambulatory Visit: Payer: Self-pay

## 2016-05-27 ENCOUNTER — Other Ambulatory Visit: Payer: Self-pay | Admitting: Cardiology

## 2016-09-06 ENCOUNTER — Emergency Department (HOSPITAL_COMMUNITY)
Admission: EM | Admit: 2016-09-06 | Discharge: 2016-09-06 | Disposition: A | Discharge status: Home / Self Care | Payer: Medicare Other | Attending: Emergency Medicine | Admitting: Emergency Medicine

## 2016-09-06 ENCOUNTER — Encounter (HOSPITAL_COMMUNITY): Payer: Self-pay

## 2016-09-06 DIAGNOSIS — R112 Nausea with vomiting, unspecified: Secondary | ICD-10-CM | POA: Diagnosis not present

## 2016-09-06 DIAGNOSIS — E039 Hypothyroidism, unspecified: Secondary | ICD-10-CM | POA: Diagnosis not present

## 2016-09-06 DIAGNOSIS — R197 Diarrhea, unspecified: Secondary | ICD-10-CM | POA: Diagnosis not present

## 2016-09-06 DIAGNOSIS — Z96659 Presence of unspecified artificial knee joint: Secondary | ICD-10-CM | POA: Diagnosis not present

## 2016-09-06 DIAGNOSIS — I10 Essential (primary) hypertension: Secondary | ICD-10-CM | POA: Diagnosis not present

## 2016-09-06 DIAGNOSIS — Z87891 Personal history of nicotine dependence: Secondary | ICD-10-CM | POA: Insufficient documentation

## 2016-09-06 DIAGNOSIS — Z79899 Other long term (current) drug therapy: Secondary | ICD-10-CM | POA: Diagnosis not present

## 2016-09-06 DIAGNOSIS — Z7982 Long term (current) use of aspirin: Secondary | ICD-10-CM | POA: Insufficient documentation

## 2016-09-06 LAB — CBC WITH DIFFERENTIAL/PLATELET
BASOS ABS: 0 10*3/uL (ref 0.0–0.1)
Basophils Relative: 0 %
EOS ABS: 0 10*3/uL (ref 0.0–0.7)
Eosinophils Relative: 0 %
HCT: 33.8 % — ABNORMAL LOW (ref 36.0–46.0)
HEMOGLOBIN: 11.2 g/dL — AB (ref 12.0–15.0)
LYMPHS PCT: 27 %
Lymphs Abs: 1.5 10*3/uL (ref 0.7–4.0)
MCH: 28.1 pg (ref 26.0–34.0)
MCHC: 33.1 g/dL (ref 30.0–36.0)
MCV: 84.7 fL (ref 78.0–100.0)
Monocytes Absolute: 1.1 10*3/uL — ABNORMAL HIGH (ref 0.1–1.0)
Monocytes Relative: 21 %
Neutro Abs: 2.8 10*3/uL (ref 1.7–7.7)
Neutrophils Relative %: 52 %
PLATELETS: 79 10*3/uL — AB (ref 150–400)
RBC: 3.99 MIL/uL (ref 3.87–5.11)
RDW: 16.7 % — ABNORMAL HIGH (ref 11.5–15.5)
WBC: 5.4 10*3/uL (ref 4.0–10.5)

## 2016-09-06 LAB — COMPREHENSIVE METABOLIC PANEL
ALBUMIN: 4.4 g/dL (ref 3.5–5.0)
ALK PHOS: 68 U/L (ref 38–126)
ALT: 29 U/L (ref 14–54)
ANION GAP: 8 (ref 5–15)
AST: 45 U/L — ABNORMAL HIGH (ref 15–41)
BUN: 16 mg/dL (ref 6–20)
CALCIUM: 9.6 mg/dL (ref 8.9–10.3)
CO2: 23 mmol/L (ref 22–32)
CREATININE: 0.66 mg/dL (ref 0.44–1.00)
Chloride: 105 mmol/L (ref 101–111)
GFR calc Af Amer: >60 mL/min (ref >60)
GFR calc non Af Amer: >60 mL/min (ref >60)
GLUCOSE: 101 mg/dL — AB (ref 65–99)
Potassium: 3.1 mmol/L — ABNORMAL LOW (ref 3.5–5.1)
SODIUM: 136 mmol/L (ref 135–145)
Total Bilirubin: 0.7 mg/dL (ref 0.3–1.2)
Total Protein: 7.6 g/dL (ref 6.5–8.1)

## 2016-09-06 LAB — LIPASE, BLOOD: Lipase: 15 U/L (ref 11–51)

## 2016-09-06 LAB — C DIFFICILE QUICK SCREEN W PCR REFLEX
C DIFFICILE (CDIFF) TOXIN: NEGATIVE
C Diff antigen: NEGATIVE
C Diff interpretation: NOT DETECTED

## 2016-09-06 MED ORDER — DIPHENOXYLATE-ATROPINE 2.5-0.025 MG PO TABS: 1.0000 | ORAL_TABLET | Freq: Four times a day (QID) | ORAL | 0 refills | Status: DC | PRN

## 2016-09-06 MED ORDER — ONDANSETRON HCL 4 MG PO TABS: 4.0000 mg | ORAL_TABLET | Freq: Four times a day (QID) | ORAL | 0 refills | Status: DC

## 2016-09-06 MED ORDER — ONDANSETRON HCL 4 MG/2ML IJ SOLN
4.0000 mg | Freq: Once | INTRAMUSCULAR | Status: AC
  Administered 2016-09-06: 4 mg via INTRAVENOUS
  Filled 2016-09-06: qty 2

## 2016-09-06 MED ORDER — SODIUM CHLORIDE 0.9 % IV BOLUS (SEPSIS)
500.0000 mL | Freq: Once | INTRAVENOUS | Status: AC
  Administered 2016-09-06: 500 mL via INTRAVENOUS

## 2016-09-06 MED ORDER — DIPHENOXYLATE-ATROPINE 2.5-0.025 MG PO TABS
1.0000 | ORAL_TABLET | Freq: Once | ORAL | Status: AC
  Administered 2016-09-06: 1 via ORAL
  Filled 2016-09-06: qty 1

## 2016-09-06 MED ORDER — LOPERAMIDE HCL 2 MG PO CAPS: 2.0000 mg | ORAL_CAPSULE | Freq: Four times a day (QID) | ORAL | 0 refills | Status: DC | PRN

## 2016-09-06 NOTE — ED Triage Notes (Signed)
[facility] about 2100 pm states she felt warm no dysuria voiced.

## 2016-09-06 NOTE — ED Provider Notes (Signed)
[facility] DEPT Provider Note   CSN: [id number] Arrival date & time: 09/06/16  0131  By signing my name below, I, Oleh Genin, attest that this documentation has been prepared under the direction and in the presence of Orpah Greek, MD. Electronically Signed: Oleh Genin, Scribe. 09/06/16. 1:57 AM.   History   Chief Complaint Chief Complaint  Patient presents with  . Nausea  . Emesis  . Diarrhea    HPI [name] [name] is a 83 y.o. female who presents to the ED with nausea, vomiting, and diarrhea from independent living. The patient states she was in her usual health until 2 days ago when these symptoms started. Symptoms are constant. No associated abdominal pain. Denies any cough or congestion. Denies any dysuria.  The history is provided by the patient. No language interpreter was used.    Past Medical History:  Diagnosis Date  . Anemia   . Anxiety   . Chronotropic incompetence 12/2012    potentially medication related; noted on CPET   . DDD (degenerative disc disease)   . Eczema   . GERD (gastroesophageal reflux disease)   . H/O hiatal hernia   . Hyperlipidemia   . Hypertension   . Hypothyroidism   . Osteopenia   . Septicemia ([place]) 2002   following UTI  . Vertigo, benign positional     Patient Active Problem List   Diagnosis Date Noted  . Hypertensive heart disease 05/02/2016  . Bilateral leg edema 04/30/2016  . Exertional dyspnea 09/29/2013  . Essential hypertension 01/29/2013  . Fatigue with decreased exercise tolerance 12/25/2012  . Chronotropic incompetence 12/24/2012  . Aortic ejection murmur 12/22/2012  . Esophagitis 05/04/2012  . GI bleed 05/03/2012  . Anemia 05/03/2012  . Postop Transfusion 04/27/2012  . OA (osteoarthritis) of knee 04/24/2012    Past Surgical History:  Procedure Laterality Date  . BREAST SURGERY     left biopsy  . CPET / MET - PFTS     Consistent with chronotropic incompetence; See attached report in the  results section  . DILATION AND CURETTAGE OF UTERUS     x 2  . DOPPLER ECHOCARDIOGRAPHY  01/10/2013   Normal LV size and function. Normal EF. Air sclerosis but no stenosis  . ESOPHAGOGASTRODUODENOSCOPY  05/04/2012   Procedure: ESOPHAGOGASTRODUODENOSCOPY (EGD);  Surgeon: Inda Castle, MD;  Location: [facility];  Service: Endoscopy;  Laterality: N/A;  . EYE SURGERY     cataract extraction with ILO  ? eye  . KNEE ARTHROSCOPY  2011   right  . TONSILLECTOMY    . TOTAL KNEE ARTHROPLASTY  04/24/2012   Procedure: TOTAL KNEE ARTHROPLASTY;  Surgeon: Gearlean Alf, MD;  Location: [facility];  Service: Orthopedics;  Laterality: Right;    OB History    No data available       Home Medications    Prior to Admission medications   Medication Sig Start Date End Date Taking? Authorizing Provider  acetaminophen (TYLENOL) 325 MG tablet Take 650 mg by mouth every 6 (six) hours as needed for mild pain.     Historical Provider, MD  amLODipine (NORVASC) 10 MG tablet Take 1 tablet (10 mg total) by mouth daily. 05/08/12   Ripudeep Krystal Eaton, MD  aspirin EC 81 MG tablet Take 81 mg by mouth daily.    Historical Provider, MD  calcium carbonate (OS-CAL) 600 MG TABS Take 600 mg by mouth 2 (two) times daily with a meal.    Historical Provider, MD  cholecalciferol (VITAMIN  D) 1000 UNITS tablet Take 1,000 Units by mouth daily.    Historical Provider, MD  diphenhydramine-acetaminophen (TYLENOL PM) 25-500 MG TABS Take 1 tablet by mouth at bedtime as needed (for sleep).    Historical Provider, MD  diphenoxylate-atropine (LOMOTIL) 2.5-0.025 MG tablet Take 1 tablet by mouth 4 (four) times daily as needed for diarrhea or loose stools. 09/06/16   Orpah Greek, MD  fish oil-omega-3 fatty acids 1000 MG capsule Take 1 g by mouth daily.    Historical Provider, MD  furosemide (LASIX) 20 MG tablet Take 1 tablet (20 mg total) by mouth as needed. 04/30/16 07/29/16  Leonie Man, MD  levothyroxine (SYNTHROID, LEVOTHROID) 50  MCG tablet Take 50 mcg by mouth daily before breakfast.    Historical Provider, MD  losartan (COZAAR) 100 MG tablet TAKE 1 TABLET BY MOUTH  DAILY 05/27/16   Leonie Man, MD  Multiple Vitamins-Minerals (MULTIVITAMIN PO) Take 1 tablet by mouth daily.    Historical Provider, MD  omeprazole (PRILOSEC) 20 MG capsule Take 20 mg by mouth daily.    Historical Provider, MD  ondansetron (ZOFRAN) 4 MG tablet Take 1 tablet (4 mg total) by mouth every 6 (six) hours. 09/06/16   Orpah Greek, MD    Family History Family History  Problem Relation Age of Onset  . Lung cancer Father 70    Died at age 77  . Stroke Mother 86    Died in her late 61s.  Marland Kitchen Heart attack Mother 36    Social History Social History  Substance Use Topics  . Smoking status: Former Smoker    Years: 0.00    Types: Cigarettes    Quit date: 05/03/1965  . Smokeless tobacco: Never Used  . Alcohol use No     Comment: occ glass wine     Allergies   Patient has no known allergies.   Review of Systems Review of Systems  HENT: Negative for congestion.   Respiratory: Negative for cough.   Gastrointestinal: Positive for diarrhea, nausea and vomiting. Negative for abdominal pain.  Genitourinary: Negative for dysuria.  All other systems reviewed and are negative.    Physical Exam Updated Vital Signs BP (!) 140/52   Pulse 85   Temp 99.1 F (37.3 C) (Oral)   Resp 15   Ht '5\' 1"'  (1.549 m)   Wt 151 lb (68.5 kg)   SpO2 93%   BMI 28.53 kg/m   Physical Exam  Constitutional: She is oriented to person, place, and time. No distress.  Patient appears frail.   HENT:  Head: Normocephalic and atraumatic.  Right Ear: Hearing normal.  Left Ear: Hearing normal.  Nose: Nose normal.  Mouth/Throat: Mucous membranes are normal.  Mucous membranes are dry.   Eyes: Conjunctivae and EOM are normal. Pupils are equal, round, and reactive to light.  Neck: Normal range of motion. Neck supple.  Cardiovascular: Regular rhythm, S1  normal and S2 normal.  Exam reveals no gallop and no friction rub.   No murmur heard. Pulmonary/Chest: Effort normal and breath sounds normal. No respiratory distress. She exhibits no tenderness.  Abdominal: Soft. Normal appearance and bowel sounds are normal. There is no hepatosplenomegaly. There is no tenderness. There is no rebound, no guarding, no tenderness at McBurney's point and negative Murphy's sign. No hernia.  Musculoskeletal: Normal range of motion.  Neurological: She is alert and oriented to person, place, and time. She has normal strength. No cranial nerve deficit or sensory deficit. Coordination normal. GCS  eye subscore is 4. GCS verbal subscore is 5. GCS motor subscore is 6.  Skin: Skin is warm, dry and intact. No rash noted. No cyanosis.  Psychiatric: She has a normal mood and affect. Her speech is normal and behavior is normal. Thought content normal.  Nursing note and vitals reviewed.    ED Treatments / Results  DIAGNOSTIC STUDIES: Oxygen Saturation is 95 percent on room air which is normal by my interpretation.    COORDINATION OF CARE: 1:52 AM Discussed treatment plan with pt at bedside and pt agreed to plan.  Labs (all labs ordered are listed, but only abnormal results are displayed) Labs Reviewed  GASTROINTESTINAL PANEL BY PCR, STOOL (REPLACES STOOL CULTURE) - Abnormal; Notable for the following:       Result Value   Norovirus GI/GII DETECTED (*)    All other components within normal limits  CBC WITH DIFFERENTIAL/PLATELET - Abnormal; Notable for the following:    Hemoglobin 11.2 (*)    HCT 33.8 (*)    RDW 16.7 (*)    Platelets 79 (*)    Monocytes Absolute 1.1 (*)    All other components within normal limits  COMPREHENSIVE METABOLIC PANEL - Abnormal; Notable for the following:    Potassium 3.1 (*)    Glucose, Bld 101 (*)    AST 45 (*)    All other components within normal limits  C DIFFICILE QUICK SCREEN W PCR REFLEX  LIPASE, BLOOD    EKG  EKG  Interpretation  Date/Time:  Monday September 06 2016 02:21:55 EST Ventricular Rate:  74 PR Interval:    QRS Duration: 111 QT Interval:  405 QTC Calculation: 450 R Axis:   -11 Text Interpretation:  Sinus rhythm Borderline T abnormalities, anterior leads Confirmed by POLLINA  MD, CHRISTOPHER ([id number]) on 09/06/2016 2:34:52 AM Also confirmed by Betsey Holiday  MD, CHRISTOPHER [phone]), editor [place]  CCT, BEVERLY ([id number])  on 09/06/2016 6:47:57 AM       Radiology No results found.  Procedures Procedures (including critical care time)  Medications Ordered in ED Medications  sodium chloride 0.9 % bolus 500 mL (0 mLs Intravenous Stopped 09/06/16 0315)  ondansetron (ZOFRAN) injection 4 mg (4 mg Intravenous Given 09/06/16 0238)  diphenoxylate-atropine (LOMOTIL) 2.5-0.025 MG per tablet 1 tablet (1 tablet Oral Given 09/06/16 0657)     Initial Impression / Assessment and Plan / ED Course  I have reviewed the triage vital signs and the nursing notes.  Pertinent labs & imaging results that were available during my care of the patient were reviewed by me and considered in my medical decision making (see chart for details).     Patient presented with complaints of acute onset nausea, vomiting, diarrhea. She had a benign abdominal exam, no pain or tenderness. No concern for acute surgical process. Blood work was unremarkable other than slight hypokalemia. C. difficile was negative. Patient felt much improved after symptomatic treatment and IV fluids. She was discharged to home with continued symptomatic treatment.  Final Clinical Impressions(s) / ED Diagnoses   Final diagnoses:  Nausea vomiting and diarrhea    New Prescriptions Discharge Medication List as of 09/06/2016  7:29 AM    START taking these medications   Details  diphenoxylate-atropine (LOMOTIL) 2.5-0.025 MG tablet Take 1 tablet by mouth 4 (four) times daily as needed for diarrhea or loose stools., Starting Mon 09/06/2016, Print      I  personally performed the services described in this documentation, which was scribed in my presence. The recorded information  has been reviewed and is accurate.    Orpah Greek, MD 09/08/16 2322

## 2016-09-06 NOTE — ED Notes (Signed)
Patient is A & O x4.  She understood discharge instructions. 

## 2016-09-06 NOTE — ED Notes (Signed)
Bed: RESA Expected date:  Expected time:  Means of arrival:  Comments: 

## 2016-09-08 LAB — GASTROINTESTINAL PANEL BY PCR, STOOL (REPLACES STOOL CULTURE)
ADENOVIRUS F40/41: NOT DETECTED
ASTROVIRUS: NOT DETECTED
CAMPYLOBACTER SPECIES: NOT DETECTED
CYCLOSPORA CAYETANENSIS: NOT DETECTED
Cryptosporidium: NOT DETECTED
ENTEROPATHOGENIC E COLI (EPEC): NOT DETECTED
Entamoeba histolytica: NOT DETECTED
Enteroaggregative E coli (EAEC): NOT DETECTED
Enterotoxigenic E coli (ETEC): NOT DETECTED
Giardia lamblia: NOT DETECTED
Norovirus GI/GII: DETECTED — AB
PLESIMONAS SHIGELLOIDES: NOT DETECTED
Rotavirus A: NOT DETECTED
SAPOVIRUS (I, II, IV, AND V): NOT DETECTED
SHIGA LIKE TOXIN PRODUCING E COLI (STEC): NOT DETECTED
Salmonella species: NOT DETECTED
Shigella/Enteroinvasive E coli (EIEC): NOT DETECTED
VIBRIO SPECIES: NOT DETECTED
Vibrio cholerae: NOT DETECTED
Yersinia enterocolitica: NOT DETECTED

## 2016-10-20 ENCOUNTER — Encounter (HOSPITAL_COMMUNITY): Payer: Self-pay | Admitting: Emergency Medicine

## 2016-10-20 ENCOUNTER — Emergency Department (HOSPITAL_COMMUNITY): Payer: Medicare Other

## 2016-10-20 ENCOUNTER — Observation Stay (HOSPITAL_COMMUNITY)
Admission: EM | Admit: 2016-10-20 | Discharge: 2016-10-22 | Disposition: A | Discharge status: Skilled Nursing Facility | Payer: Medicare Other | Attending: Internal Medicine | Admitting: Internal Medicine

## 2016-10-20 DIAGNOSIS — E785 Hyperlipidemia, unspecified: Secondary | ICD-10-CM | POA: Insufficient documentation

## 2016-10-20 DIAGNOSIS — F419 Anxiety disorder, unspecified: Secondary | ICD-10-CM | POA: Diagnosis not present

## 2016-10-20 DIAGNOSIS — E039 Hypothyroidism, unspecified: Secondary | ICD-10-CM | POA: Insufficient documentation

## 2016-10-20 DIAGNOSIS — M545 Low back pain, unspecified: Secondary | ICD-10-CM

## 2016-10-20 DIAGNOSIS — D649 Anemia, unspecified: Secondary | ICD-10-CM | POA: Diagnosis not present

## 2016-10-20 DIAGNOSIS — E871 Hypo-osmolality and hyponatremia: Secondary | ICD-10-CM | POA: Diagnosis present

## 2016-10-20 DIAGNOSIS — R531 Weakness: Secondary | ICD-10-CM | POA: Diagnosis not present

## 2016-10-20 DIAGNOSIS — I10 Essential (primary) hypertension: Secondary | ICD-10-CM | POA: Diagnosis present

## 2016-10-20 DIAGNOSIS — S32020A Wedge compression fracture of second lumbar vertebra, initial encounter for closed fracture: Secondary | ICD-10-CM | POA: Diagnosis present

## 2016-10-20 DIAGNOSIS — Z7982 Long term (current) use of aspirin: Secondary | ICD-10-CM | POA: Insufficient documentation

## 2016-10-20 DIAGNOSIS — M4856XA Collapsed vertebra, not elsewhere classified, lumbar region, initial encounter for fracture: Secondary | ICD-10-CM | POA: Diagnosis not present

## 2016-10-20 DIAGNOSIS — I119 Hypertensive heart disease without heart failure: Secondary | ICD-10-CM | POA: Diagnosis not present

## 2016-10-20 DIAGNOSIS — Z79899 Other long term (current) drug therapy: Secondary | ICD-10-CM | POA: Insufficient documentation

## 2016-10-20 DIAGNOSIS — D638 Anemia in other chronic diseases classified elsewhere: Secondary | ICD-10-CM | POA: Diagnosis present

## 2016-10-20 DIAGNOSIS — Z87891 Personal history of nicotine dependence: Secondary | ICD-10-CM | POA: Diagnosis not present

## 2016-10-20 DIAGNOSIS — K219 Gastro-esophageal reflux disease without esophagitis: Secondary | ICD-10-CM | POA: Insufficient documentation

## 2016-10-20 DIAGNOSIS — D696 Thrombocytopenia, unspecified: Secondary | ICD-10-CM | POA: Diagnosis not present

## 2016-10-20 DIAGNOSIS — Z96651 Presence of right artificial knee joint: Secondary | ICD-10-CM | POA: Insufficient documentation

## 2016-10-20 DIAGNOSIS — R339 Retention of urine, unspecified: Secondary | ICD-10-CM | POA: Diagnosis not present

## 2016-10-20 MED ORDER — MORPHINE SULFATE (PF) 4 MG/ML IV SOLN
4.0000 mg | Freq: Once | INTRAVENOUS | Status: AC
  Administered 2016-10-20: 4 mg via INTRAVENOUS
  Filled 2016-10-20: qty 1

## 2016-10-20 NOTE — ED Triage Notes (Signed)
Pt brought in by EMS with c/o lower back pain  Pt state she has had problems with her lower back recently but it was getting better until this morning when she was standing in her kitchen  Pt states the pain has progressively gotten worse throughout the day  Denies injury or fall  Denies urinary sxs or N/V/D   Pt state she has an appointment to see her orthopaedic in the morning but could not wait due to the pain

## 2016-10-20 NOTE — ED Notes (Signed)
ED Provider at bedside. 

## 2016-10-20 NOTE — ED Triage Notes (Signed)
Pt states she took tramadol around 1730 without relief

## 2016-10-20 NOTE — ED Notes (Signed)
Patient transported to X-ray 

## 2016-10-21 ENCOUNTER — Encounter (HOSPITAL_COMMUNITY): Payer: Self-pay | Admitting: Family Medicine

## 2016-10-21 ENCOUNTER — Observation Stay (HOSPITAL_COMMUNITY): Payer: Medicare Other

## 2016-10-21 DIAGNOSIS — S32020A Wedge compression fracture of second lumbar vertebra, initial encounter for closed fracture: Secondary | ICD-10-CM | POA: Diagnosis present

## 2016-10-21 DIAGNOSIS — D696 Thrombocytopenia, unspecified: Secondary | ICD-10-CM | POA: Diagnosis not present

## 2016-10-21 DIAGNOSIS — R339 Retention of urine, unspecified: Secondary | ICD-10-CM | POA: Diagnosis not present

## 2016-10-21 DIAGNOSIS — I10 Essential (primary) hypertension: Secondary | ICD-10-CM | POA: Diagnosis not present

## 2016-10-21 DIAGNOSIS — E871 Hypo-osmolality and hyponatremia: Secondary | ICD-10-CM

## 2016-10-21 DIAGNOSIS — D508 Other iron deficiency anemias: Secondary | ICD-10-CM | POA: Diagnosis not present

## 2016-10-21 LAB — CBC WITH DIFFERENTIAL/PLATELET
Basophils Absolute: 0 10*3/uL (ref 0.0–0.1)
Basophils Relative: 0 %
Eosinophils Absolute: 0 10*3/uL (ref 0.0–0.7)
Eosinophils Relative: 0 %
HEMATOCRIT: 33.2 % — AB (ref 36.0–46.0)
Hemoglobin: 11.6 g/dL — ABNORMAL LOW (ref 12.0–15.0)
LYMPHS PCT: 16 %
Lymphs Abs: 1.2 10*3/uL (ref 0.7–4.0)
MCH: 28.8 pg (ref 26.0–34.0)
MCHC: 34.9 g/dL (ref 30.0–36.0)
MCV: 82.4 fL (ref 78.0–100.0)
MONO ABS: 1.2 10*3/uL — AB (ref 0.1–1.0)
Monocytes Relative: 16 %
NEUTROS ABS: 5.1 10*3/uL (ref 1.7–7.7)
Neutrophils Relative %: 68 %
Platelets: 106 10*3/uL — ABNORMAL LOW (ref 150–400)
RBC: 4.03 MIL/uL (ref 3.87–5.11)
RDW: 17.3 % — AB (ref 11.5–15.5)
WBC: 7.4 10*3/uL (ref 4.0–10.5)

## 2016-10-21 LAB — URINALYSIS, ROUTINE W REFLEX MICROSCOPIC
BILIRUBIN URINE: NEGATIVE
Glucose, UA: NEGATIVE mg/dL
Hgb urine dipstick: NEGATIVE
Ketones, ur: NEGATIVE mg/dL
LEUKOCYTES UA: NEGATIVE
NITRITE: NEGATIVE
PH: 7 (ref 5.0–8.0)
Protein, ur: NEGATIVE mg/dL
SPECIFIC GRAVITY, URINE: 1.006 (ref 1.005–1.030)

## 2016-10-21 LAB — BASIC METABOLIC PANEL
ANION GAP: 7 (ref 5–15)
BUN: 22 mg/dL — ABNORMAL HIGH (ref 6–20)
CALCIUM: 10.1 mg/dL (ref 8.9–10.3)
CO2: 25 mmol/L (ref 22–32)
Chloride: 99 mmol/L — ABNORMAL LOW (ref 101–111)
Creatinine, Ser: 0.7 mg/dL (ref 0.44–1.00)
GFR calc Af Amer: >60 mL/min (ref >60)
GFR calc non Af Amer: >60 mL/min (ref >60)
GLUCOSE: 113 mg/dL — AB (ref 65–99)
POTASSIUM: 4.1 mmol/L (ref 3.5–5.1)
Sodium: 131 mmol/L — ABNORMAL LOW (ref 135–145)

## 2016-10-21 MED ORDER — ACETAMINOPHEN 325 MG PO TABS
650.0000 mg | ORAL_TABLET | Freq: Four times a day (QID) | ORAL | Status: DC
  Administered 2016-10-21 – 2016-10-22 (×5): 650 mg via ORAL
  Filled 2016-10-21 (×6): qty 2

## 2016-10-21 MED ORDER — SODIUM CHLORIDE 0.9 % IV SOLN
INTRAVENOUS | Status: DC
  Administered 2016-10-21: 05:00:00 via INTRAVENOUS

## 2016-10-21 MED ORDER — ONDANSETRON HCL 4 MG/2ML IJ SOLN
4.0000 mg | Freq: Four times a day (QID) | INTRAMUSCULAR | Status: DC | PRN
  Administered 2016-10-21 (×2): 4 mg via INTRAVENOUS
  Filled 2016-10-21 (×2): qty 2

## 2016-10-21 MED ORDER — POLYETHYLENE GLYCOL 3350 17 G PO PACK: 17.0000 g | PACK | Freq: Every day | ORAL | Status: DC | PRN

## 2016-10-21 MED ORDER — MORPHINE SULFATE (PF) 4 MG/ML IV SOLN
4.0000 mg | INTRAVENOUS | Status: AC | PRN
  Administered 2016-10-21 (×2): 4 mg via INTRAVENOUS
  Filled 2016-10-21 (×2): qty 1

## 2016-10-21 MED ORDER — HYDROCODONE-ACETAMINOPHEN 5-325 MG PO TABS
1.0000 | ORAL_TABLET | ORAL | Status: DC | PRN
  Administered 2016-10-22: 1 via ORAL
  Filled 2016-10-21: qty 1

## 2016-10-21 MED ORDER — ENOXAPARIN SODIUM 40 MG/0.4ML ~~LOC~~ SOLN
40.0000 mg | SUBCUTANEOUS | Status: DC
  Administered 2016-10-21 – 2016-10-22 (×2): 40 mg via SUBCUTANEOUS
  Filled 2016-10-21 (×2): qty 0.4

## 2016-10-21 MED ORDER — ONDANSETRON HCL 4 MG PO TABS: 4.0000 mg | ORAL_TABLET | Freq: Four times a day (QID) | ORAL | Status: DC | PRN

## 2016-10-21 MED ORDER — LEVOTHYROXINE SODIUM 50 MCG PO TABS
50.0000 ug | ORAL_TABLET | Freq: Every day | ORAL | Status: DC
  Administered 2016-10-21 – 2016-10-22 (×2): 50 ug via ORAL
  Filled 2016-10-21 (×2): qty 1

## 2016-10-21 MED ORDER — ASPIRIN EC 81 MG PO TBEC
81.0000 mg | DELAYED_RELEASE_TABLET | Freq: Every day | ORAL | Status: DC
Start: 2016-10-21 — End: 2016-10-22
  Administered 2016-10-21 – 2016-10-22 (×2): 81 mg via ORAL
  Filled 2016-10-21 (×2): qty 1

## 2016-10-21 MED ORDER — TRAMADOL HCL 50 MG PO TABS
50.0000 mg | ORAL_TABLET | Freq: Four times a day (QID) | ORAL | Status: DC | PRN
  Administered 2016-10-21: 50 mg via ORAL
  Filled 2016-10-21 (×2): qty 1

## 2016-10-21 MED ORDER — AMLODIPINE BESYLATE 10 MG PO TABS
10.0000 mg | ORAL_TABLET | Freq: Every day | ORAL | Status: DC
  Administered 2016-10-21 – 2016-10-22 (×2): 10 mg via ORAL
  Filled 2016-10-21 (×3): qty 1

## 2016-10-21 MED ORDER — METHOCARBAMOL 500 MG PO TABS
500.0000 mg | ORAL_TABLET | Freq: Three times a day (TID) | ORAL | Status: DC | PRN
  Administered 2016-10-22: 500 mg via ORAL
  Filled 2016-10-21: qty 1

## 2016-10-21 MED ORDER — LOSARTAN POTASSIUM 50 MG PO TABS
100.0000 mg | ORAL_TABLET | Freq: Every day | ORAL | Status: DC
  Administered 2016-10-21: 100 mg via ORAL
  Filled 2016-10-21: qty 2

## 2016-10-21 MED ORDER — CALCITONIN (SALMON) 200 UNIT/ACT NA SOLN
1.0000 | Freq: Every day | NASAL | Status: DC
  Administered 2016-10-21 – 2016-10-22 (×2): 1 via NASAL
  Filled 2016-10-21: qty 3.7

## 2016-10-21 MED ORDER — POLYETHYLENE GLYCOL 3350 17 G PO PACK
17.0000 g | PACK | Freq: Every day | ORAL | Status: DC
  Administered 2016-10-21 – 2016-10-22 (×2): 17 g via ORAL
  Filled 2016-10-21 (×2): qty 1

## 2016-10-21 MED ORDER — SENNOSIDES-DOCUSATE SODIUM 8.6-50 MG PO TABS
1.0000 | ORAL_TABLET | Freq: Two times a day (BID) | ORAL | Status: DC
  Administered 2016-10-21 – 2016-10-22 (×2): 1 via ORAL
  Filled 2016-10-21 (×2): qty 1

## 2016-10-21 NOTE — ED Provider Notes (Signed)
[facility] DEPT Provider Note   CSN: [id number] Arrival date & time: 10/20/16  2059     History   Chief Complaint Chief Complaint  Patient presents with  . Back Pain    HPI [name] [name] is a 83 y.o. female.  The history is provided by the patient.  Back Pain   This is a new problem. The current episode started 3 to 5 hours ago. The problem occurs constantly. The problem has been gradually worsening. The pain is associated with no known injury. The pain is present in the lumbar spine and thoracic spine. The quality of the pain is described as stabbing. The pain does not radiate. The pain is at a severity of 10/10. The pain is severe. The symptoms are aggravated by bending, twisting and certain positions. The pain is the same all the time. Pertinent negatives include no chest pain, no fever, no numbness, no abdominal pain, no abdominal swelling, no bowel incontinence, no perianal numbness, no bladder incontinence, no dysuria, no pelvic pain, no leg pain, no paresthesias, no paresis, no tingling and no weakness. She has tried analgesics for the symptoms. The treatment provided no relief. Risk factors include a history of osteoporosis.   History of T9 compression fracture status post kyphoplasty. History of osteoporosis and degenerative disc disease. Sudden onset of lower thoracic upper lumbar back pain while standing today without any trauma or fall. No numbness or weakness, bowel incontinence, saddle anesthesia, fever or chills, abd pain or chest pain. No syncope, near syncope, dyspnea or confusion. Feels like she is having difficulty urinating.  Past Medical History:  Diagnosis Date  . Anemia   . Anxiety   . Chronotropic incompetence 12/2012    potentially medication related; noted on CPET   . DDD (degenerative disc disease)   . Eczema   . GERD (gastroesophageal reflux disease)   . H/O hiatal hernia   . Hyperlipidemia   . Hypertension   . Hypothyroidism   . Osteopenia   .  Septicemia ([place]) 2002   following UTI  . Vertigo, benign positional     Patient Active Problem List   Diagnosis Date Noted  . Compression fracture of L2 lumbar vertebra, closed, initial encounter ([place]) 10/21/2016  . Hypertensive heart disease 05/02/2016  . Bilateral leg edema 04/30/2016  . Exertional dyspnea 09/29/2013  . Essential hypertension 01/29/2013  . Fatigue with decreased exercise tolerance 12/25/2012  . Chronotropic incompetence 12/24/2012  . Aortic ejection murmur 12/22/2012  . Esophagitis 05/04/2012  . GI bleed 05/03/2012  . Anemia 05/03/2012  . Postop Transfusion 04/27/2012  . OA (osteoarthritis) of knee 04/24/2012    Past Surgical History:  Procedure Laterality Date  . BREAST SURGERY     left biopsy  . CPET / MET - PFTS     Consistent with chronotropic incompetence; See attached report in the results section  . DILATION AND CURETTAGE OF UTERUS     x 2  . DOPPLER ECHOCARDIOGRAPHY  01/10/2013   Normal LV size and function. Normal EF. Air sclerosis but no stenosis  . ESOPHAGOGASTRODUODENOSCOPY  05/04/2012   Procedure: ESOPHAGOGASTRODUODENOSCOPY (EGD);  Surgeon: Inda Castle, MD;  Location: [facility];  Service: Endoscopy;  Laterality: N/A;  . EYE SURGERY     cataract extraction with ILO  ? eye  . KNEE ARTHROSCOPY  2011   right  . TONSILLECTOMY    . TOTAL KNEE ARTHROPLASTY  04/24/2012   Procedure: TOTAL KNEE ARTHROPLASTY;  Surgeon: Gearlean Alf, MD;  Location: [facility];  Service: Orthopedics;  Laterality: Right;    OB History    No data available       Home Medications    Prior to Admission medications   Medication Sig Start Date End Date Taking? Authorizing Provider  ALPRAZolam (XANAX) 0.25 MG tablet Take 0.25 mg by mouth 2 (two) times daily as needed for anxiety.  08/16/16  Yes Historical Provider, MD  amLODipine (NORVASC) 10 MG tablet Take 1 tablet (10 mg total) by mouth daily. 05/08/12  Yes Ripudeep Krystal Eaton, MD  aspirin EC 81 MG tablet Take 81 mg  by mouth daily.   Yes Historical Provider, MD  furosemide (LASIX) 20 MG tablet Take 20 mg by mouth daily as needed for fluid or edema.   Yes Historical Provider, MD  levothyroxine (SYNTHROID, LEVOTHROID) 50 MCG tablet Take 50 mcg by mouth daily before breakfast.   Yes Historical Provider, MD  losartan (COZAAR) 100 MG tablet TAKE 1 TABLET BY MOUTH  DAILY 05/27/16  Yes Leonie Man, MD  omeprazole (PRILOSEC) 40 MG capsule Take 40 mg by mouth daily.   Yes Historical Provider, MD  traMADol (ULTRAM) 50 MG tablet Take 50 mg by mouth every 8 (eight) hours as needed for moderate pain or severe pain.   Yes Historical Provider, MD  acetaminophen (TYLENOL) 325 MG tablet Take 650 mg by mouth every 6 (six) hours as needed for mild pain.     Historical Provider, MD  calcium carbonate (OS-CAL) 600 MG TABS Take 600 mg by mouth 2 (two) times daily with a meal.    Historical Provider, MD  cholecalciferol (VITAMIN D) 1000 UNITS tablet Take 1,000 Units by mouth daily.    Historical Provider, MD  diphenhydramine-acetaminophen (TYLENOL PM) 25-500 MG TABS Take 1 tablet by mouth at bedtime as needed (for sleep).    Historical Provider, MD  diphenoxylate-atropine (LOMOTIL) 2.5-0.025 MG tablet Take 1 tablet by mouth 4 (four) times daily as needed for diarrhea or loose stools. 09/06/16   Orpah Greek, MD  fish oil-omega-3 fatty acids 1000 MG capsule Take 1 g by mouth daily.    Historical Provider, MD  furosemide (LASIX) 20 MG tablet Take 1 tablet (20 mg total) by mouth as needed. 04/30/16 07/29/16  Leonie Man, MD  Multiple Vitamins-Minerals (MULTIVITAMIN PO) Take 1 tablet by mouth daily.    Historical Provider, MD  ondansetron (ZOFRAN) 4 MG tablet Take 1 tablet (4 mg total) by mouth every 6 (six) hours. Patient not taking: Reported on 10/20/2016 09/06/16   Orpah Greek, MD    Family History Family History  Problem Relation Age of Onset  . Lung cancer Father 3    Died at age 38  . Stroke Mother 67     Died in her late 71s.  Marland Kitchen Heart attack Mother 31    Social History Social History  Substance Use Topics  . Smoking status: Former Smoker    Years: 0.00    Types: Cigarettes    Quit date: 05/03/1965  . Smokeless tobacco: Never Used  . Alcohol use No     Comment: occ glass wine     Allergies   Patient has no known allergies.   Review of Systems Review of Systems  Constitutional: Negative for fever.  HENT: Negative for congestion.   Respiratory: Negative for shortness of breath.   Cardiovascular: Negative for chest pain.  Gastrointestinal: Negative for abdominal pain and bowel incontinence.  Genitourinary: Negative for bladder incontinence, dysuria and pelvic pain.  Musculoskeletal: Positive for back pain.  Skin: Negative for wound.  Allergic/Immunologic: Negative for immunocompromised state.  Neurological: Negative for tingling, weakness, numbness and paresthesias.  Hematological: Does not bruise/bleed easily.  Psychiatric/Behavioral: Negative for confusion.  All other systems reviewed and are negative.    Physical Exam Updated Vital Signs BP (!) 162/58 (BP Location: Left Arm)   Pulse 77   Temp 97.7 F (36.5 C) (Oral)   Resp 18   SpO2 97%   Physical Exam Physical Exam  Nursing note and vitals reviewed. Constitutional: Well developed, well nourished, non-toxic, and in no acute distress Head: Normocephalic and atraumatic.  Mouth/Throat: Oropharynx is clear and moist.  Neck: Normal range of motion. Neck supple.  Cardiovascular: Normal rate and regular rhythm.   Pulmonary/Chest: Effort normal and breath sounds normal.  Abdominal: Soft. There is no tenderness. There is no rebound and no guarding.  Musculoskeletal: tenderness in the upper lumbar low thoracic spine, no step-offs or deformities  Neurological: Alert, no facial droop, fluent speech, full strength in hip flexion/extension, knee flexion/extension, and ankle dorsi and plantar flexion bilaterally, sensation  to light touch intact throughout bilateral lower extremities., No saddle anesthesia, normal rectal tone Skin: Skin is warm and dry.  Psychiatric: Cooperative   ED Treatments / Results  Labs (all labs ordered are listed, but only abnormal results are displayed) Labs Reviewed  CBC WITH DIFFERENTIAL/PLATELET - Abnormal; Notable for the following:       Result Value   Hemoglobin 11.6 (*)    HCT 33.2 (*)    RDW 17.3 (*)    Platelets 106 (*)    Monocytes Absolute 1.2 (*)    All other components within normal limits  BASIC METABOLIC PANEL - Abnormal; Notable for the following:    Sodium 131 (*)    Chloride 99 (*)    Glucose, Bld 113 (*)    BUN 22 (*)    All other components within normal limits  URINALYSIS, ROUTINE W REFLEX MICROSCOPIC - Abnormal; Notable for the following:    Color, Urine STRAW (*)    All other components within normal limits    EKG  EKG Interpretation None       Radiology Ct Thoracic Spine Wo Contrast  Result Date: 10/20/2016 CLINICAL DATA:  Sudden onset of low thoracic spine pain and tenderness. History of compression fracture. EXAM: CT THORACIC SPINE WITHOUT CONTRAST TECHNIQUE: Multidetector CT images of the thoracic were obtained using the standard protocol without intravenous contrast. Imaging obtained from lower cervical spine through L2. COMPARISON:  Thoracic spine MRI 07/02/2015, no interval imaging. FINDINGS: Alignment: Mild exaggerated kyphosis, unchanged. Vertebrae: Mild superior endplate compression fracture of L2 with 25% loss of height anteriorly. There is minimal bony retropulsion superiorly. No evidence of extension through the posterior elements. Prominent Schmorl's node inferior endplate of L1. Remote T9 fracture with 75% loss of height and posterior element involvement. There has been vertebroplasty. Unchanged loss of height from prior. No remaining thoracic vertebral body heights are maintained scattered Schmorl's nodes. No bony destructive  process. Paraspinal and other soft tissues: No canal hematoma. Linear and dependent lung base opacities favor atelectasis. Advanced atherosclerosis of the thoracoabdominal aorta. Disc levels: Thoracic disc spaces are preserved. Mild endplate spurring. Vacuum phenomenon noted at L1-L2. IMPRESSION: 1. Acute appearing L2 compression fracture with 25% loss of height anteriorly and mild bony retropulsion posteriorly. 2. Stable T9 compression fracture post vertebroplasty. Electronically Signed   By: Jeb Levering M.D.   On: 10/20/2016 23:53    Procedures  Procedures (including critical care time)  Medications Ordered in ED Medications  morphine 4 MG/ML injection 4 mg (4 mg Intravenous Given 10/20/16 2344)     Initial Impression / Assessment and Plan / ED Course  I have reviewed the triage vital signs and the nursing notes.  Pertinent labs & imaging results that were available during my care of the patient were reviewed by me and considered in my medical decision making (see chart for details).     Presenting with spontaneous onset of mid to low back pain. Afebrile and hemodynamically stable. Has normal neurological exam, however with 1L urinary retention on PVR. Foley catheter placed. CT of thoracic spine visualized and she has L2 compression fracture likely causing pain. Seems less likely that isolated urinary retention with otherwise normal neuro exam would be concerning for spinal cord compression. Spoke with Dr. Cyndy Freeze, who agrees unlikely spinal cord involvement. Does not think she requires emergent MRI. She is having trouble with pain control after morphine, unable to sit up and fully amublate. Discuss with Dr. Loleta Books who will admit for pain control.   Final Clinical Impressions(s) / ED Diagnoses   Final diagnoses:  Acute midline low back pain without sciatica  Closed compression fracture of second lumbar vertebra, initial encounter [facility])  Urinary retention    New Prescriptions New  Prescriptions   No medications on file     Forde Dandy, MD 10/21/16 0139

## 2016-10-21 NOTE — H&P (Signed)
History and Physical  Patient Name: Janaye Corp Kreitzer     TOI:[id number]    DOB: 1930-12-13    DOA: 10/20/2016 PCP: Geoffery Lyons, MD   Patient coming from: [facility]  Chief Complaint: Back pain, inability to transfer, inability urinate  HPI: NAASIA WEILBACHER is a 83 y.o. female with a past medical history significant for HTN, osteopenia and prior compression fracture s/p Kyphoplasty 10/2015 by Dr. Estanislado Pandy who presents with back pain  The patient does state of health until about 3 weeks ago when she had a few days of "back spasms" and pain in her low back, which resolved with conservative measures and hot pad. Then in the day today, the pain came back, but was much more intense than prior, while she was standing in the kitchen. Throughout the day it progressed, she found she was unable to transfer in and out of a chair or in or out of bed, her pain was not relieved with tramadol, and she felt that she couldn't urinate, so she came to the emergency room.  ED course: -Afebrile, heart rate 78, respirations and pulse is normal, blood pressure 163/58 -Na 131, K 4.1, Cr 0.7, WBC 7.4K, Hgb 11.6 -Urinalysis clear -CT showed an acute L2 fracture -She did appear to have 1 L of retained urine by bladder scan, so Foley was placed -The case was discussed with neurosurgery, who doubted spinal cord compression in the setting of isolated urinary retention, and so urgent spinal imaging was deferred -TRH were asked to observe the patient for PT evaluation and pain control given her living alone and her inability to transfer in or out of a chair     The patient had a nontraumatic compression fracture of T9 in 2017, underwent kyphoplasty by Dr. Estanislado Pandy.     ROS: Review of Systems  Musculoskeletal: Positive for back pain.  Neurological: Negative for tingling, sensory change and focal weakness.  All other systems reviewed and are negative.         Past Medical History:  Diagnosis  Date  . Anemia   . Anxiety   . Chronotropic incompetence 12/2012    potentially medication related; noted on CPET   . DDD (degenerative disc disease)   . Eczema   . GERD (gastroesophageal reflux disease)   . H/O hiatal hernia   . Hyperlipidemia   . Hypertension   . Hypothyroidism   . Osteopenia   . Septicemia ([place]) 2002   following UTI  . Vertigo, benign positional     Past Surgical History:  Procedure Laterality Date  . BREAST SURGERY     left biopsy  . CPET / MET - PFTS     Consistent with chronotropic incompetence; See attached report in the results section  . DILATION AND CURETTAGE OF UTERUS     x 2  . DOPPLER ECHOCARDIOGRAPHY  01/10/2013   Normal LV size and function. Normal EF. Air sclerosis but no stenosis  . ESOPHAGOGASTRODUODENOSCOPY  05/04/2012   Procedure: ESOPHAGOGASTRODUODENOSCOPY (EGD);  Surgeon: Inda Castle, MD;  Location: [facility];  Service: Endoscopy;  Laterality: N/A;  . EYE SURGERY     cataract extraction with ILO  ? eye  . KNEE ARTHROSCOPY  2011   right  . TONSILLECTOMY    . TOTAL KNEE ARTHROPLASTY  04/24/2012   Procedure: TOTAL KNEE ARTHROPLASTY;  Surgeon: Gearlean Alf, MD;  Location: [facility];  Service: Orthopedics;  Laterality: Right;    Social History: Patient lives alone at  [facility] independent living.  The patient walks unassisted.  Remote minimal smoking history.  From [place].  Has two daughters in town.  No Known Allergies  Family history: family history includes Heart attack (age of onset: 22) in her mother; Lung cancer (age of onset: 44) in her father; Stroke (age of onset: 32) in her mother.  Prior to Admission medications   Medication Sig Start Date End Date Taking? Authorizing Provider  ALPRAZolam (XANAX) 0.25 MG tablet Take 0.25 mg by mouth 2 (two) times daily as needed for anxiety.  08/16/16  Yes Historical Provider, MD  amLODipine (NORVASC) 10 MG tablet Take 1 tablet (10 mg total) by mouth daily. 05/08/12  Yes Ripudeep  Krystal Eaton, MD  aspirin EC 81 MG tablet Take 81 mg by mouth daily.   Yes Historical Provider, MD  furosemide (LASIX) 20 MG tablet Take 20 mg by mouth daily as needed for fluid or edema.   Yes Historical Provider, MD  levothyroxine (SYNTHROID, LEVOTHROID) 50 MCG tablet Take 50 mcg by mouth daily before breakfast.   Yes Historical Provider, MD  losartan (COZAAR) 100 MG tablet TAKE 1 TABLET BY MOUTH  DAILY 05/27/16  Yes Leonie Man, MD  omeprazole (PRILOSEC) 40 MG capsule Take 40 mg by mouth daily.   Yes Historical Provider, MD  traMADol (ULTRAM) 50 MG tablet Take 50 mg by mouth every 8 (eight) hours as needed for moderate pain or severe pain.   Yes Historical Provider, MD  acetaminophen (TYLENOL) 325 MG tablet Take 650 mg by mouth every 6 (six) hours as needed for mild pain.     Historical Provider, MD  calcium carbonate (OS-CAL) 600 MG TABS Take 600 mg by mouth 2 (two) times daily with a meal.    Historical Provider, MD  cholecalciferol (VITAMIN D) 1000 UNITS tablet Take 1,000 Units by mouth daily.    Historical Provider, MD  diphenhydramine-acetaminophen (TYLENOL PM) 25-500 MG TABS Take 1 tablet by mouth at bedtime as needed (for sleep).    Historical Provider, MD  diphenoxylate-atropine (LOMOTIL) 2.5-0.025 MG tablet Take 1 tablet by mouth 4 (four) times daily as needed for diarrhea or loose stools. 09/06/16   Orpah Greek, MD  fish oil-omega-3 fatty acids 1000 MG capsule Take 1 g by mouth daily.    Historical Provider, MD  furosemide (LASIX) 20 MG tablet Take 1 tablet (20 mg total) by mouth as needed. 04/30/16 07/29/16  Leonie Man, MD  Multiple Vitamins-Minerals (MULTIVITAMIN PO) Take 1 tablet by mouth daily.    Historical Provider, MD       Physical Exam: BP (!) 145/57 (BP Location: Left Arm)   Pulse 77   Temp 97.7 F (36.5 C) (Oral)   Resp 18   SpO2 90%  General appearance: Elderly adult female, alert and in moderate distress from pain, lying very stiffly in bed, cannot move due  to pain.   Eyes: Anicteric, conjunctiva pink, lids and lashes normal. PERRL.    ENT: No nasal deformity, discharge, epistaxis.  Hearing normal. OP dry without lesions.   Neck: No neck masses.  Trachea midline.  No thyromegaly/tenderness. Skin: Warm and dry.  No jaundice.  No suspicious rashes or lesions. Cardiac: RRR, nl S1-S2, no murmurs appreciated.  Capillary refill is brisk.  JVP not visible.  No LE edema.  Radial pulses 2+ and symmetric. Respiratory: Normal respiratory rate and rhythm.  CTAB without rales or wheezes. Abdomen: Abdomen soft.  No TTP. No ascites, distension, hepatosplenomegaly.   MSK:  No deformities or effusions.  No cyanosis or clubbing.  Unable to bend at waist due to pain.Marland Kitchen Neuro: Cranial nerves normal.  Sensation intact to light touch in legs. Speech is fluent.  Muscle strength limited by pain in legs.    Psych: Sensorium intact and responding to questions, attention normal.  Behavior appropriate.  Affect blunted by pain.  Judgment and insight appear normal.     Labs on Admission:  I have personally reviewed following labs and imaging studies: CBC:  Recent Labs Lab 10/20/16 2340  WBC 7.4  NEUTROABS 5.1  HGB 11.6*  HCT 33.2*  MCV 82.4  PLT 623*   Basic Metabolic Panel:  Recent Labs Lab 10/20/16 2340  NA 131*  K 4.1  CL 99*  CO2 25  GLUCOSE 113*  BUN 22*  CREATININE 0.70  CALCIUM 10.1   GFR: CrCl cannot be calculated (Unknown ideal weight.).  Liver Function Tests: No results for input(s): AST, ALT, ALKPHOS, BILITOT, PROT, ALBUMIN in the last 168 hours. No results for input(s): LIPASE, AMYLASE in the last 168 hours. No results for input(s): AMMONIA in the last 168 hours. Coagulation Profile: No results for input(s): INR, PROTIME in the last 168 hours. Cardiac Enzymes: No results for input(s): CKTOTAL, CKMB, CKMBINDEX, TROPONINI in the last 168 hours. BNP (last 3 results) No results for input(s): PROBNP in the last 8760 hours. HbA1C: No  results for input(s): HGBA1C in the last 72 hours. CBG: No results for input(s): GLUCAP in the last 168 hours. Lipid Profile: No results for input(s): CHOL, HDL, LDLCALC, TRIG, CHOLHDL, LDLDIRECT in the last 72 hours. Thyroid Function Tests: No results for input(s): TSH, T4TOTAL, FREET4, T3FREE, THYROIDAB in the last 72 hours. Anemia Panel: No results for input(s): VITAMINB12, FOLATE, FERRITIN, TIBC, IRON, RETICCTPCT in the last 72 hours. Sepsis Labs: Invalid input(s): PROCALCITONIN, LACTICIDVEN No results found for this or any previous visit (from the past 240 hour(s)).       Radiological Exams on Admission: Personally reviewed CT report reviewed: Ct Thoracic Spine Wo Contrast  Result Date: 10/20/2016 CLINICAL DATA:  Sudden onset of low thoracic spine pain and tenderness. History of compression fracture. EXAM: CT THORACIC SPINE WITHOUT CONTRAST TECHNIQUE: Multidetector CT images of the thoracic were obtained using the standard protocol without intravenous contrast. Imaging obtained from lower cervical spine through L2. COMPARISON:  Thoracic spine MRI 07/02/2015, no interval imaging. FINDINGS: Alignment: Mild exaggerated kyphosis, unchanged. Vertebrae: Mild superior endplate compression fracture of L2 with 25% loss of height anteriorly. There is minimal bony retropulsion superiorly. No evidence of extension through the posterior elements. Prominent Schmorl's node inferior endplate of L1. Remote T9 fracture with 75% loss of height and posterior element involvement. There has been vertebroplasty. Unchanged loss of height from prior. No remaining thoracic vertebral body heights are maintained scattered Schmorl's nodes. No bony destructive process. Paraspinal and other soft tissues: No canal hematoma. Linear and dependent lung base opacities favor atelectasis. Advanced atherosclerosis of the thoracoabdominal aorta. Disc levels: Thoracic disc spaces are preserved. Mild endplate spurring. Vacuum  phenomenon noted at L1-L2. IMPRESSION: 1. Acute appearing L2 compression fracture with 25% loss of height anteriorly and mild bony retropulsion posteriorly. 2. Stable T9 compression fracture post vertebroplasty. Electronically Signed   By: Jeb Levering M.D.   On: 10/20/2016 23:53    Echocardiogram 2014: Report reviewed.   Normal EF    Assessment/Plan  1. Compression fracture of L2:  -PT eval -Consult to CM and SW to assist in coordinating homecare or skilled  nursing -acetaminophen scheduled -Tramadol PRN q6hrs (IV morphine ordered tonight) -Calcitonin nasal spray daily for 2 weeks -MR lumbar spine ordered given urinary retention   2. Anemia:  Stable.  3. Hyponatrmeia:  Mild, likely from pain.  4. Thrombocytopenia:  Mild.  Stable from previous.  5. Hypertension:  Hypertensive at admission, likely from pain. -Continue amlodipine, losartan -Continue furosemide -Continue aspirin           DVT prophylaxis: Lovenox  Code Status: FULL  Family Communication: None presetn  Disposition Plan: Anticipate PT eval and CM coordination for further care.  Attempt pain control overnight with IV morphine, transition to scheduled acetaminophen, tramadol (which was successful for her last year) and calcitonin nasal spray Consults called: None Admission status: OBS At the point of initial evaluation, it is my clinical opinion that admission for OBSERVATION is reasonable and necessary because the patient's presenting complaints in the context of their chronic conditions represent sufficient risk of deterioration or significant morbidity to constitute reasonable grounds for close observation in the hospital setting, but that the patient may be medically stable for discharge from the hospital within 24 to 48 hours.    Medical decision making: Patient seen at 1:30 AM on 10/21/2016.  The patient was discussed with Dr. Oleta Mouse.  What exists of the patient's chart was reviewed in depth and  summarized above.  Clinical condition: stable.        Edwin Dada [facility] Pager [phone]

## 2016-10-21 NOTE — Care Management Obs Status (Signed)
MEDICARE OBSERVATION STATUS NOTIFICATION   Patient Details  Name: [name] [name] MRN: [id number] Date of Birth: 03/31/1931   Medicare Observation Status Notification Given:  Yes    CrutchfieldDerrill Memo, Jayln Branscom M, RN 10/21/2016, 8:39 AM

## 2016-10-21 NOTE — Progress Notes (Signed)
PT Cancellation Note  Patient Details Name: [name] [name] MRN: [id number] DOB: 09/20/1930   Cancelled Treatment:    Reason Eval/Treat Not Completed: Patient not medically ready (for MRI, Will check back tomorrow./)   Rada HayHill, Corbin Falck Elizabeth 10/21/2016, 12:35 PM Blanchard KelchKaren Tong Pieczynski PT [phone]

## 2016-10-21 NOTE — Care Management Note (Signed)
Case Management Note  Patient Details  Name: [name] [name] MRN: [id number] Date of Birth: 08/19/1930  Subjective/Objective:  83 y.o. F who was admitted with extreme back pain requiring IV pain medicine, and inability to ambulate. Awaiting PT evaluation for DME and therapy recommendations although MD is most certain will need at least a short rehab stay post hospital.                   Action/Plan:CM will continue to follow.    Expected Discharge Date:                  Expected Discharge Plan:  Skilled Nursing Facility  In-House Referral:  Clinical Social Work  Discharge planning Services  CM Consult  Post Acute Care Choice:    Choice offered to:  Patient  DME Arranged:    DME Agency:     HH Arranged:    HH Agency:     Status of Service:  In process, will continue to follow  If discussed at Long Length of Stay Meetings, dates discussed:    Additional Comments:  [name] [name], [name] Kathol M, RN 10/21/2016, 3:11 PM

## 2016-10-21 NOTE — Progress Notes (Deleted)
Patient ID: [name] [name], female   DOB: 11-Nov-1930, 83 y.o.   MRN: [id number]    Referring Physician(s): Patel,P  Supervising Physician: Richarda Overlie  Patient Status:  [facility] - In-pt  Chief Complaint:  Back pain, lumbar compression fractures  Subjective: Patient familiar to IR service from prior T9 vertebroplasty by Dr. Corliss Skains in December 2016. She has a known history of osteopenia and was recently admitted with persistent back pain. According to the patient she began having some back spasms approximately 3 weeks ago which went away with conservative therapy. She began to experience back pain again yesterday which limited her mobility and presented to the hospital for further evaluation. She denied any recent falls or trauma. MRI of the lumbar spine today revealed: 1. L1 and L2 superior endplate fractures with a subacute appearance. Height loss is up to 30% at L2. Minimal retropulsion at L2 without impingement. 2. Degenerative changes without notable progression since 2016. L4-5 right foraminal impingement. 3. 13 mm cystic structure along the pancreatic tail is stable from 2016 and incidental. Could obtain 1 year follow-up MRI if appropriate for comorbidities.  Request now received from primary service for consideration of lumbar kyphoplasty/vertebroplasty on patient. Patient currently denies significant back pain. Her main complaint is nausea.  Allergies: Patient has no known allergies.  Medications: Prior to Admission medications   Medication Sig Start Date End Date Taking? Authorizing Provider  ALPRAZolam (XANAX) 0.25 MG tablet Take 0.25 mg by mouth 2 (two) times daily as needed for anxiety.  08/16/16  Yes Historical Provider, MD  amLODipine (NORVASC) 10 MG tablet Take 1 tablet (10 mg total) by mouth daily. 05/08/12  Yes Ripudeep Jenna Luo, MD  aspirin EC 81 MG tablet Take 81 mg by mouth daily.   Yes Historical Provider, MD  furosemide (LASIX) 20 MG tablet Take 20 mg by mouth daily as  needed for fluid or edema.   Yes Historical Provider, MD  levothyroxine (SYNTHROID, LEVOTHROID) 50 MCG tablet Take 50 mcg by mouth daily before breakfast.   Yes Historical Provider, MD  losartan (COZAAR) 100 MG tablet TAKE 1 TABLET BY MOUTH  DAILY 05/27/16  Yes Marykay Lex, MD  omeprazole (PRILOSEC) 40 MG capsule Take 40 mg by mouth daily.   Yes Historical Provider, MD  traMADol (ULTRAM) 50 MG tablet Take 50 mg by mouth every 8 (eight) hours as needed for moderate pain or severe pain.   Yes Historical Provider, MD  acetaminophen (TYLENOL) 325 MG tablet Take 650 mg by mouth every 6 (six) hours as needed for mild pain.     Historical Provider, MD  calcium carbonate (OS-CAL) 600 MG TABS Take 600 mg by mouth 2 (two) times daily with a meal.    Historical Provider, MD  cholecalciferol (VITAMIN D) 1000 UNITS tablet Take 1,000 Units by mouth daily.    Historical Provider, MD  diphenhydramine-acetaminophen (TYLENOL PM) 25-500 MG TABS Take 1 tablet by mouth at bedtime as needed (for sleep).    Historical Provider, MD  diphenoxylate-atropine (LOMOTIL) 2.5-0.025 MG tablet Take 1 tablet by mouth 4 (four) times daily as needed for diarrhea or loose stools. 09/06/16   Gilda Crease, MD  fish oil-omega-3 fatty acids 1000 MG capsule Take 1 g by mouth daily.    Historical Provider, MD  furosemide (LASIX) 20 MG tablet Take 1 tablet (20 mg total) by mouth as needed. 04/30/16 07/29/16  Marykay Lex, MD  Multiple Vitamins-Minerals (MULTIVITAMIN PO) Take 1 tablet by mouth daily.  Historical Provider, MD     Vital Signs: BP (!) 119/38 (BP Location: Right Arm)   Pulse 63   Temp 98.3 F (36.8 C) (Oral)   Resp 18   SpO2 98%   Physical Exam awake, alert. Patient moving all fours okay. sensory function okay. Cannot reproduce any significant paravertebral tenderness in the L1-2 region.  Imaging: Ct Thoracic Spine Wo Contrast  Result Date: 10/20/2016 CLINICAL DATA:  Sudden onset of low thoracic spine pain  and tenderness. History of compression fracture. EXAM: CT THORACIC SPINE WITHOUT CONTRAST TECHNIQUE: Multidetector CT images of the thoracic were obtained using the standard protocol without intravenous contrast. Imaging obtained from lower cervical spine through L2. COMPARISON:  Thoracic spine MRI 07/02/2015, no interval imaging. FINDINGS: Alignment: Mild exaggerated kyphosis, unchanged. Vertebrae: Mild superior endplate compression fracture of L2 with 25% loss of height anteriorly. There is minimal bony retropulsion superiorly. No evidence of extension through the posterior elements. Prominent Schmorl's node inferior endplate of L1. Remote T9 fracture with 75% loss of height and posterior element involvement. There has been vertebroplasty. Unchanged loss of height from prior. No remaining thoracic vertebral body heights are maintained scattered Schmorl's nodes. No bony destructive process. Paraspinal and other soft tissues: No canal hematoma. Linear and dependent lung base opacities favor atelectasis. Advanced atherosclerosis of the thoracoabdominal aorta. Disc levels: Thoracic disc spaces are preserved. Mild endplate spurring. Vacuum phenomenon noted at L1-L2. IMPRESSION: 1. Acute appearing L2 compression fracture with 25% loss of height anteriorly and mild bony retropulsion posteriorly. 2. Stable T9 compression fracture post vertebroplasty. Electronically Signed   By: Rubye OaksMelanie  Ehinger M.D.   On: 10/20/2016 23:53   Mr Lumbar Spine Wo Contrast  Result Date: 10/21/2016 CLINICAL DATA:  L2 compression fracture with back pain. EXAM: MRI LUMBAR SPINE WITHOUT CONTRAST TECHNIQUE: Multiplanar, multisequence MR imaging of the lumbar spine was performed. No intravenous contrast was administered. COMPARISON:  07/02/2015 MRI.  Thoracic spine CT 10/20/2016 FINDINGS: Segmentation:  Standard. Alignment:  Mild scoliosis. Vertebrae: L1 and L2 superior endplate fractures with up to 30% height loss at L2. Both show mild marrow  edema and no convincing paravertebral edema. No evidence of underlying bone lesion. No discitis. Conus medullaris: Extends to the L1 level and appears normal. Paraspinal and other soft tissues: 13 mm cystic intensity structure which appears contiguous with the pancreatic duct the level of the tail, stable in retrospect when compared to 2016 MRI. Material within the lumen of the cecum is nonspecific, usually from stool. Disc levels: T12- L1: Unremarkable. L1-L2: Mild disc narrowing and bulging. No impingement. Mild ventral subarachnoid space effacement by L2 fracture deformity. L2-L3: Small right paracentral disc protrusion that is broad and extends to the foramen. No impingement L3-L4: Hyperintensity within the disc attributed to fissure. Mild disc narrowing and bulging. Mild facet spurring. No impingement L4-L5: Advanced disc narrowing that is asymmetric to the right where there is bulky far-lateral spurring and disc bulging. Facet arthropathy with asymmetric right facet spurring. Right foraminal L4 impingement. Central disc protrusion with mild spinal stenosis. L5-S1:Facet arthropathy with moderate spurring and joint fluid. No herniation or impingement. IMPRESSION: 1. L1 and L2 superior endplate fractures with a subacute appearance. Height loss is up to 30% at L2. Minimal retropulsion at L2 without impingement. 2. Degenerative changes without notable progression since 2016. L4-5 right foraminal impingement. 3. 13 mm cystic structure along the pancreatic tail is stable from 2016 and incidental. Could obtain 1 year follow-up MRI if appropriate for comorbidities. Electronically Signed   By: Marja KaysJonathon  Watts M.D.   On: 10/21/2016 12:20    Labs:  CBC:  Recent Labs  09/06/16 0234 10/20/16 2340  WBC 5.4 7.4  HGB 11.2* 11.6*  HCT 33.8* 33.2*  PLT 79* 106*    COAGS: No results for input(s): INR, APTT in the last 8760 hours.  BMP:  Recent Labs  09/06/16 0234 10/20/16 2340  NA 136 131*  K 3.1* 4.1   CL 105 99*  CO2 23 25  GLUCOSE 101* 113*  BUN 16 22*  CALCIUM 9.6 10.1  CREATININE 0.66 0.70  GFRNONAA >60 >60  GFRAA >60 >60    LIVER FUNCTION TESTS:  Recent Labs  09/06/16 0234  BILITOT 0.7  AST 45*  ALT 29  ALKPHOS 68  PROT 7.6  ALBUMIN 4.4    Assessment and Plan: Patient with history of back pain, prior T9 vertebroplasty in 2016. Now with newly noted L1 and L2 compression fractures. Imaging studies were reviewed by Dr. Bonnielee Haff today. Above fractures appear to be amenable to kyphoplasty, however the patient at this time denies significant back pain in the region. There is no significant paravertebral tenderness appreciated on exam today. She was able to get out of bed and walk a few steps with walker without significant back pain. Details/risks of kyphoplasty were again discussed with patient. At this time we will continue to monitor patient for worsening symptoms. If kyphoplasty performed insurance coverage will need to be assessed prior to procedure which may take a few days in view of upcoming holiday.   Electronically Signed: D. Jeananne Rama 10/21/2016, 3:51 PM   I spent a total of 20 minutes at the the patient's bedside AND on the patient's hospital floor or unit, greater than 50% of which was counseling/coordinating care for possible L1-L2 kyphoplasty

## 2016-10-21 NOTE — Progress Notes (Addendum)
[facility] PLAN OF CARE NOTE Patient: [name] [name] ZOX:[id number]   PCP: Minda MeoARONSON,RICHARD A, MD DOB: 09/01/1930   DOA: 10/20/2016   DOS: 10/21/2016    Patient was admitted by my colleague Dr. Maryfrances Bunnellanford earlier on 10/21/2016. I have reviewed the H&P as well as assessment and plan and agree with the same. Important changes in the plan are listed below.  Plan of care: Principal Problem:   Compression fracture of L2 lumbar vertebra, closed, initial encounter [facility](HCC) Active Problems:   Anemia   Essential hypertension   Hyponatremia   Thrombocytopenia (HCC) change pain medication to add norco and robaxin. MRI completed, discuss with IR they will evaluate pt for candidacy of kyphoplasty for pain control.  Possible discharge tomorrow to SNF pending pain control.   Author: Lynden OxfordPranav Tessy Pawelski, MD [facility] Pager: [phone] 10/21/2016 2:04 PM   If 7PM-7AM, please contact night-coverage at [email or web address], password [email or web address]

## 2016-10-22 DIAGNOSIS — S32020A Wedge compression fracture of second lumbar vertebra, initial encounter for closed fracture: Secondary | ICD-10-CM | POA: Diagnosis not present

## 2016-10-22 LAB — CBC
HCT: 30.6 % — ABNORMAL LOW (ref 36.0–46.0)
HEMOGLOBIN: 10.3 g/dL — AB (ref 12.0–15.0)
MCH: 28.9 pg (ref 26.0–34.0)
MCHC: 33.7 g/dL (ref 30.0–36.0)
MCV: 86 fL (ref 78.0–100.0)
PLATELETS: 97 10*3/uL — AB (ref 150–400)
RBC: 3.56 MIL/uL — ABNORMAL LOW (ref 3.87–5.11)
RDW: 17.5 % — AB (ref 11.5–15.5)
WBC: 4.9 10*3/uL (ref 4.0–10.5)

## 2016-10-22 LAB — BASIC METABOLIC PANEL
ANION GAP: 5 (ref 5–15)
BUN: 13 mg/dL (ref 6–20)
CALCIUM: 9.2 mg/dL (ref 8.9–10.3)
CO2: 26 mmol/L (ref 22–32)
CREATININE: 0.59 mg/dL (ref 0.44–1.00)
Chloride: 102 mmol/L (ref 101–111)
GLUCOSE: 99 mg/dL (ref 65–99)
Potassium: 3.9 mmol/L (ref 3.5–5.1)
Sodium: 133 mmol/L — ABNORMAL LOW (ref 135–145)

## 2016-10-22 MED ORDER — HYDROCODONE-ACETAMINOPHEN 5-325 MG PO TABS
1.0000 | ORAL_TABLET | Freq: Four times a day (QID) | ORAL | 0 refills | Status: DC | PRN
Start: 2016-10-22 — End: 2017-05-23

## 2016-10-22 MED ORDER — METHOCARBAMOL 500 MG PO TABS: 500.0000 mg | ORAL_TABLET | Freq: Three times a day (TID) | ORAL | 0 refills | Status: DC | PRN

## 2016-10-22 MED ORDER — CALCITONIN (SALMON) 200 UNIT/ACT NA SOLN: 1.0000 | Freq: Every day | NASAL | 0 refills | Status: AC

## 2016-10-22 MED ORDER — POLYETHYLENE GLYCOL 3350 17 G PO PACK: 17.0000 g | PACK | Freq: Every day | ORAL | 0 refills | Status: DC

## 2016-10-22 NOTE — Progress Notes (Signed)
Pt is ready to dc back to [facility] today for [employer]. PT has approved transport by car. D/C Summary sent to SNF for review. Scripts included in [employer]. D/C packet provided to pt / daughter. # for report provided to nsg.  Cori Razor LCSW [phone]

## 2016-10-22 NOTE — Progress Notes (Signed)
Report called to caroline at [facility]  [facility] [place]

## 2016-10-22 NOTE — Discharge Summary (Addendum)
[facility] Discharge Summary   Patient: [name] [name] ZOX:[id number]   PCP: Minda Meo, MD DOB: [birth year]   Date of admission: 10/20/2016   Date of discharge:  10/22/2016    Discharge Diagnoses:  Principal Problem:   Compression fracture of L2 lumbar vertebra, closed, initial encounter [facility]) Active Problems:   Anemia   Essential hypertension   Hyponatremia   Thrombocytopenia (HCC)  Admitted From: ALF Disposition:  SNF  Recommendations for Outpatient Follow-up:  1. Please follow up with PCP in 1 week 2. If there is no improvement in back pain in 1 week call Dr Fatima Sanger office for arranging follow up for kyphoplasty   Follow-up Information    ARONSON,RICHARD A, MD. Schedule an appointment as soon as possible for a visit in 1 week(s).   Specialty:  Internal Medicine Contact information: [address] [place] [address] [phone]        Oneal Grout, MD. Call in 1 week(s).   Specialty:  Interventional Radiology Why:  as needed, call this number [phone] and ask for Victorino Dike to arrange a follow up.  Contact information: [address] [place] [address] [phone]          Diet recommendation: cardiac diet  Activity: The patient is advised to gradually reintroduce usual activities.  Discharge Condition: good  Code Status: full code  History of present illness: As per the H and P dictated on admission, "[name] [name] is a 83 y.o. female with a past medical history significant for HTN, osteopenia and prior compression fracture s/p Kyphoplasty 10/2015 by Dr. Corliss Skains who presents with back pain  The patient does state of health until about 3 weeks ago when she had a few days of "back spasms" and pain in her low back, which resolved with conservative measures and hot pad. Then in the day today, the pain came back, but was much more intense than prior, while she was standing in the kitchen. Throughout the day it  progressed, she found she was unable to transfer in and out of a chair or in or out of bed, her pain was not relieved with tramadol, and she felt that she couldn't urinate, so she came to the emergency room."  Hospital Course:  Summary of her active problems in the hospital is as following. 1. Compression fracture of L2:  MRI lumber spine shows L1 and L2 superior endplate fractures with a subacute appearance. Height loss is up to 30% at L2. Minimal retropulsion at L2 without impingement. Discussed with neurosurgery earlier by EDP, no further work up Recommended. -Calcitonin nasal spray daily for 1 week IR consulted for consideration of kyphoplasty, pt's pain was well controled and was able to walk with therapy assistance therefor they felt pt may not have significant benefit out of kyphoplasty, but if the pt does not improve in 1 week and they Recommend outpatient follow up with Dr Peggye Form.  2. Osteoporosis  Pt was on prolia in the past and now not on that therapy. Recommend outpatient follow up with PCP and consider resuming therapy  3. Hyponatrmeia:  Resolved  Mild, likely from pain.  4. Thrombocytopenia:  Mild.  Stable from previous.  5. Hypertension:  Hypertensive at admission, likely from pain. -Continue amlodipine, losartan -Continue furosemide -Continue aspirin  6. Anemia:  Stable.  All other chronic medical condition were stable during the hospitalization.  Patient was seen by physical therapy, who recommended SNF, which was arranged by Child psychotherapist and case Production designer, theatre/television/film. On  the day of the discharge the patient's vitals were stable, and no other acute medical condition were reported by patient. the patient was felt safe to be discharge at SNF with therapy.  Procedures and Results:  none   Consultations:  Interventional radiology  Phone consultation with neurosurgery   DISCHARGE MEDICATION: Current Discharge Medication List    START taking these medications     Details  calcitonin, salmon, (MIACALCIN/FORTICAL) 200 UNIT/ACT nasal spray Place 1 spray into alternate nostrils daily. Qty: 3.7 mL, Refills: 0    HYDROcodone-acetaminophen (NORCO/VICODIN) 5-325 MG tablet Take 1 tablet by mouth every 6 (six) hours as needed for moderate pain. Qty: 20 tablet, Refills: 0    methocarbamol (ROBAXIN) 500 MG tablet Take 1 tablet (500 mg total) by mouth every 8 (eight) hours as needed for muscle spasms. Qty: 20 tablet, Refills: 0    polyethylene glycol (MIRALAX / GLYCOLAX) packet Take 17 g by mouth daily. Qty: 14 each, Refills: 0      CONTINUE these medications which have NOT CHANGED   Details  ALPRAZolam (XANAX) 0.25 MG tablet Take 0.25 mg by mouth 2 (two) times daily as needed for anxiety.     amLODipine (NORVASC) 10 MG tablet Take 1 tablet (10 mg total) by mouth daily.    aspirin EC 81 MG tablet Take 81 mg by mouth daily.    furosemide (LASIX) 20 MG tablet Take 20 mg by mouth daily as needed for fluid or edema.    levothyroxine (SYNTHROID, LEVOTHROID) 50 MCG tablet Take 50 mcg by mouth daily before breakfast.    losartan (COZAAR) 100 MG tablet TAKE 1 TABLET BY MOUTH  DAILY Qty: 90 tablet, Refills: 3    omeprazole (PRILOSEC) 40 MG capsule Take 40 mg by mouth daily.    traMADol (ULTRAM) 50 MG tablet Take 50 mg by mouth every 8 (eight) hours as needed for moderate pain or severe pain.    acetaminophen (TYLENOL) 325 MG tablet Take 650 mg by mouth every 6 (six) hours as needed for mild pain.     calcium carbonate (OS-CAL) 600 MG TABS Take 600 mg by mouth 2 (two) times daily with a meal.    cholecalciferol (VITAMIN D) 1000 UNITS tablet Take 1,000 Units by mouth daily.    diphenhydramine-acetaminophen (TYLENOL PM) 25-500 MG TABS Take 1 tablet by mouth at bedtime as needed (for sleep).    fish oil-omega-3 fatty acids 1000 MG capsule Take 1 g by mouth daily.    Multiple Vitamins-Minerals (MULTIVITAMIN PO) Take 1 tablet by mouth daily.      STOP  taking these medications     diphenoxylate-atropine (LOMOTIL) 2.5-0.025 MG tablet        No Known Allergies Discharge Instructions    Diet - low sodium heart healthy    Complete by:  As directed    Discharge instructions    Complete by:  As directed    It is important that you read following instructions as well as go over your medication list with RN to help you understand your care after this hospitalization.  Discharge Instructions: Please follow-up with PCP in one week  Please request your primary care physician to go over all Hospital Tests and Procedure/Radiological results at the follow up,  Please get all Hospital records sent to your PCP by signing hospital release before you go home.   Do not drive, operating heavy machinery, perform activities at heights, swimming or participation in water activities or provide baby sitting services while you  are on Pain, Sleep and Anxiety Medications; until you have been seen by Primary Care Physician or a Neurologist and advised to do so again. Do not take more than prescribed Pain, Sleep and Anxiety Medications. You were cared for by a hospitalist during your hospital stay. If you have any questions about your discharge medications or the care you received while you were in the hospital after you are discharged, you can call the unit and ask to speak with the hospitalist on call if the hospitalist that took care of you is not available.  Once you are discharged, your primary care physician will handle any further medical issues. Please note that NO REFILLS for any discharge medications will be authorized once you are discharged, as it is imperative that you return to your primary care physician (or establish a relationship with a primary care physician if you do not have one) for your aftercare needs so that they can reassess your need for medications and monitor your lab values. You Must read complete instructions/literature along with all the  possible adverse reactions/side effects for all the Medicines you take and that have been prescribed to you. Take any new Medicines after you have completely understood and accept all the possible adverse reactions/side effects. Wear Seat belts while driving.   Increase activity slowly    Complete by:  As directed      Discharge Exam: There were no vitals filed for this visit. Vitals:   10/21/16 2140 10/22/16 0509  BP: (!) 129/41 (!) 125/35  Pulse: 63 61  Resp: 17 18  Temp: 98.4 F (36.9 C) 98.8 F (37.1 C)   General: Appear in no distress, no Rash; Oral Mucosa moist. Cardiovascular: S1 and S2 Present, no Murmur, no JVD Respiratory: Bilateral Air entry present and Clear to Auscultation, no Crackles, no wheezes Abdomen: Bowel Sound present, Soft and no tenderness Extremities: no Pedal edema, no calf tenderness Neurology: Grossly no focal neuro deficit.  The results of significant diagnostics from this hospitalization (including imaging, microbiology, ancillary and laboratory) are listed below for reference.    Significant Diagnostic Studies: Ct Thoracic Spine Wo Contrast  Result Date: 10/20/2016 CLINICAL DATA:  Sudden onset of low thoracic spine pain and tenderness. History of compression fracture. EXAM: CT THORACIC SPINE WITHOUT CONTRAST TECHNIQUE: Multidetector CT images of the thoracic were obtained using the standard protocol without intravenous contrast. Imaging obtained from lower cervical spine through L2. COMPARISON:  Thoracic spine MRI 07/02/2015, no interval imaging. FINDINGS: Alignment: Mild exaggerated kyphosis, unchanged. Vertebrae: Mild superior endplate compression fracture of L2 with 25% loss of height anteriorly. There is minimal bony retropulsion superiorly. No evidence of extension through the posterior elements. Prominent Schmorl's node inferior endplate of L1. Remote T9 fracture with 75% loss of height and posterior element involvement. There has been vertebroplasty.  Unchanged loss of height from prior. No remaining thoracic vertebral body heights are maintained scattered Schmorl's nodes. No bony destructive process. Paraspinal and other soft tissues: No canal hematoma. Linear and dependent lung base opacities favor atelectasis. Advanced atherosclerosis of the thoracoabdominal aorta. Disc levels: Thoracic disc spaces are preserved. Mild endplate spurring. Vacuum phenomenon noted at L1-L2. IMPRESSION: 1. Acute appearing L2 compression fracture with 25% loss of height anteriorly and mild bony retropulsion posteriorly. 2. Stable T9 compression fracture post vertebroplasty. Electronically Signed   By: Rubye Oaks M.D.   On: 10/20/2016 23:53   Mr Lumbar Spine Wo Contrast  Result Date: 10/21/2016 CLINICAL DATA:  L2 compression fracture with back pain.  EXAM: MRI LUMBAR SPINE WITHOUT CONTRAST TECHNIQUE: Multiplanar, multisequence MR imaging of the lumbar spine was performed. No intravenous contrast was administered. COMPARISON:  07/02/2015 MRI.  Thoracic spine CT 10/20/2016 FINDINGS: Segmentation:  Standard. Alignment:  Mild scoliosis. Vertebrae: L1 and L2 superior endplate fractures with up to 30% height loss at L2. Both show mild marrow edema and no convincing paravertebral edema. No evidence of underlying bone lesion. No discitis. Conus medullaris: Extends to the L1 level and appears normal. Paraspinal and other soft tissues: 13 mm cystic intensity structure which appears contiguous with the pancreatic duct the level of the tail, stable in retrospect when compared to 2016 MRI. Material within the lumen of the cecum is nonspecific, usually from stool. Disc levels: T12- L1: Unremarkable. L1-L2: Mild disc narrowing and bulging. No impingement. Mild ventral subarachnoid space effacement by L2 fracture deformity. L2-L3: Small right paracentral disc protrusion that is broad and extends to the foramen. No impingement L3-L4: Hyperintensity within the disc attributed to fissure. Mild  disc narrowing and bulging. Mild facet spurring. No impingement L4-L5: Advanced disc narrowing that is asymmetric to the right where there is bulky far-lateral spurring and disc bulging. Facet arthropathy with asymmetric right facet spurring. Right foraminal L4 impingement. Central disc protrusion with mild spinal stenosis. L5-S1:Facet arthropathy with moderate spurring and joint fluid. No herniation or impingement. IMPRESSION: 1. L1 and L2 superior endplate fractures with a subacute appearance. Height loss is up to 30% at L2. Minimal retropulsion at L2 without impingement. 2. Degenerative changes without notable progression since 2016. L4-5 right foraminal impingement. 3. 13 mm cystic structure along the pancreatic tail is stable from 2016 and incidental. Could obtain 1 year follow-up MRI if appropriate for comorbidities. Electronically Signed   By: Marnee Spring M.D.   On: 10/21/2016 12:20    Microbiology: No results found for this or any previous visit (from the past 240 hour(s)).   Labs: CBC:  Recent Labs Lab 10/20/16 2340 10/22/16 0421  WBC 7.4 4.9  NEUTROABS 5.1  --   HGB 11.6* 10.3*  HCT 33.2* 30.6*  MCV 82.4 86.0  PLT 106* 97*   Basic Metabolic Panel:  Recent Labs Lab 10/20/16 2340 10/22/16 0421  NA 131* 133*  K 4.1 3.9  CL 99* 102  CO2 25 26  GLUCOSE 113* 99  BUN 22* 13  CREATININE 0.70 0.59  CALCIUM 10.1 9.2   Time spent: 30 minutes  Signed:  Emira Eubanks  [facility]  10/22/2016  , 11:25 AM

## 2016-10-22 NOTE — [place] FL2 (Signed)
[place] MEDICAID FL2 LEVEL OF CARE SCREENING TOOL     IDENTIFICATION  Patient Name: [name] [name] Birthdate: November 12, 1930 Sex: female Admission Date (Current Location): 10/20/2016  [facility] and [place] Number:  Producer, television/film/video and Address:  [facility],  [address] [place] [address]      Provider Number: [id number]  Attending Physician Name and Address:  Rolly Salter, MD  Relative Name and Phone Number:       Current Level of Care: Hospital Recommended Level of Care: Skilled Nursing Facility Prior Approval Number:    Date Approved/Denied:   PASRR Number: [id number]  Discharge Plan: SNF    Current Diagnoses: Patient Active Problem List   Diagnosis Date Noted  . Compression fracture of L2 lumbar vertebra, closed, initial encounter (HCC) 10/21/2016  . Hyponatremia 10/21/2016  . Thrombocytopenia (HCC) 10/21/2016  . Hypertensive heart disease 05/02/2016  . Bilateral leg edema 04/30/2016  . Exertional dyspnea 09/29/2013  . Essential hypertension 01/29/2013  . Fatigue with decreased exercise tolerance 12/25/2012  . Chronotropic incompetence 12/24/2012  . Aortic ejection murmur 12/22/2012  . Esophagitis 05/04/2012  . GI bleed 05/03/2012  . Anemia 05/03/2012  . Postop Transfusion 04/27/2012  . OA (osteoarthritis) of knee 04/24/2012    Orientation RESPIRATION BLADDER Height & Weight     Self, Time, Situation, Place  Normal Indwelling catheter Weight:   Height:     BEHAVIORAL SYMPTOMS/MOOD NEUROLOGICAL BOWEL NUTRITION STATUS  Other (Comment) (no behaviors)   Continent Diet  AMBULATORY STATUS COMMUNICATION OF NEEDS Skin   Limited Assist Verbally Normal                       Personal Care Assistance Level of Assistance  Bathing, Feeding, Dressing Bathing Assistance: Limited assistance Feeding assistance: Independent Dressing Assistance: Limited assistance     Functional Limitations Info  Sight, Hearing, Speech Sight Info:  Adequate Hearing Info: Adequate Speech Info: Adequate    SPECIAL CARE FACTORS FREQUENCY  PT (By licensed PT), OT (By licensed OT)     PT Frequency: 5x wk OT Frequency: 5x wk            Contractures Contractures Info: Not present    Additional Factors Info  Code Status Code Status Info: Full Code             Current Medications (10/22/2016):  This is the current hospital active medication list Current Facility-Administered Medications  Medication Dose Route Frequency Provider Last Rate Last Dose  . acetaminophen (TYLENOL) tablet 650 mg  650 mg Oral Q6H Alberteen Sam, MD   650 mg at 10/22/16 0109  . amLODipine (NORVASC) tablet 10 mg  10 mg Oral Daily Alberteen Sam, MD   10 mg at 10/22/16 0842  . aspirin EC tablet 81 mg  81 mg Oral Daily Alberteen Sam, MD   81 mg at 10/22/16 0842  . calcitonin (salmon) (MIACALCIN/FORTICAL) nasal spray 1 spray  1 spray Alternating Nares Daily Alberteen Sam, MD   1 spray at 10/22/16 0844  . enoxaparin (LOVENOX) injection 40 mg  40 mg Subcutaneous Q24H Alberteen Sam, MD   40 mg at 10/22/16 0843  . HYDROcodone-acetaminophen (NORCO/VICODIN) 5-325 MG per tablet 1 tablet  1 tablet Oral Q4H PRN Rolly Salter, MD   1 tablet at 10/22/16 0849  . levothyroxine (SYNTHROID, LEVOTHROID) tablet 50 mcg  50 mcg Oral QAC breakfast Alberteen Sam, MD   50 mcg at 10/22/16 [phone]  .  losartan (COZAAR) tablet 100 mg Alberteen Sam Christopher P Danford, MD   100 mg at 10/21/16 1019  . methocarbamol (ROBAXIN) tablet 500 mg  500 mg Oral Q8H PRN Rolly Salter, MD   500 mg at 10/22/16 0108  . ondansetron (ZOFRAN) tablet 4 mg  4 mg Oral Q6H PRN Alberteen Sam, MD       Or  . ondansetron (ZOFRAN) injection 4 mg  4 mg Intravenous Q6H PRN Alberteen Sam, MD   4 mg at 10/21/16 1556  . polyethylene glycol (MIRALAX / GLYCOLAX) packet 17 g  17 g Oral Daily Rolly Salter, MD   17 g at 10/22/16 0843  . senna-docusate  (Senokot-S) tablet 1 tablet  1 tablet Oral BID Rolly Salter, MD   1 tablet at 10/22/16 [phone]  . traMADol (ULTRAM) tablet 50 mg  50 mg Oral Q6H PRN Alberteen Sam, MD   50 mg at 10/21/16 0746     Discharge Medications: Please see discharge summary for a list of discharge medications.  Relevant Imaging Results:  Relevant Lab Results:   Additional Information SS # [id number]  Dodi Leu, Dickey Gave, LCSW

## 2016-10-22 NOTE — Evaluation (Signed)
Physical Therapy Evaluation Patient Details Name: [name] [name] MRN: [id number] DOB: 1930/08/07 Today's Date: 10/22/2016   History of Present Illness  Pt admitted with back pain and dx with L1 and L2 comp fx.  Pt with hx of TKR, DDD, vertigo and T9 compression fx with kyphoplasty 4/17  Clinical Impression  Pt admitted as above and presenting with functional mobility limitations 2* back pain, generalized weakness, and balance deficits with ambulation.  Pt would benefit from short term rehab at [facility] prior to return to apartment alone.    Follow Up Recommendations      Equipment Recommendations       Recommendations for Other Services       Precautions / Restrictions Precautions Precautions: Fall;Back Restrictions Weight Bearing Restrictions: No      Mobility  Bed Mobility Overal bed mobility: Needs Assistance Bed Mobility: Sit to Supine       Sit to supine: Min assist;Mod assist   General bed mobility comments: cues for log roll technique and min/mod assist to manage trunk and LEs  Transfers Overall transfer level: Needs assistance Equipment used: Rolling walker (2 wheeled) Transfers: Sit to/from Stand Sit to Stand: Min assist         General transfer comment: cues for transition position and use of UEs to self assist  Ambulation/Gait Ambulation/Gait assistance: Min assist Ambulation Distance (Feet): 50 Feet (x 3) Assistive device: Rolling walker (2 wheeled) Gait Pattern/deviations: Step-through pattern;Decreased step length - right;Decreased step length - left;Shuffle;Narrow base of support Gait velocity: decr Gait velocity interpretation: Below normal speed for age/gender General Gait Details: cues for posture, position from RW and to increased BOS.  Multple short standing rests required  Stairs            Wheelchair Mobility    Modified Rankin (Stroke Patients Only)       Balance Overall balance assessment: Needs  assistance Sitting-balance support: No upper extremity supported;Feet supported Sitting balance-Leahy Scale: Fair     Standing balance support: No upper extremity supported Standing balance-Leahy Scale: Fair                               Pertinent Vitals/Pain Pain Assessment: Faces Faces Pain Scale: Hurts little more Pain Location: back pain with bed mobility Pain Descriptors / Indicators: Aching;Sore;Grimacing Pain Intervention(s): Limited activity within patient's tolerance;Monitored during session;Premedicated before session    Home Living Family/patient expects to be discharged to:: Skilled nursing facility                 Additional Comments: Pt in IND living at [facility] and would benefit from short term follow up in [facility] rehab setting    Prior Function Level of Independence: Independent with assistive device(s)         Comments: Pt used cane or RW as needed     Hand Dominance        Extremity/Trunk Assessment   Upper Extremity Assessment Upper Extremity Assessment: Generalized weakness    Lower Extremity Assessment Lower Extremity Assessment: Generalized weakness    Cervical / Trunk Assessment Cervical / Trunk Assessment: Kyphotic  Communication   Communication: No difficulties  Cognition Arousal/Alertness: Awake/alert Behavior During Therapy: WFL for tasks assessed/performed Overall Cognitive Status: Within Functional Limits for tasks assessed  General Comments      Exercises     Assessment/Plan    PT Assessment Patient needs continued PT services  PT Problem List Decreased strength;Decreased activity tolerance;Decreased balance;Decreased mobility;Decreased knowledge of use of DME;Pain       PT Treatment Interventions DME instruction;Gait training;Functional mobility training;Therapeutic activities;Therapeutic exercise;Patient/family education    PT Goals  (Current goals can be found in the Care Plan section)  Acute Rehab PT Goals Patient Stated Goal: Rehab to regain IND for return home  PT Goal Formulation: With patient Time For Goal Achievement: 10/30/16 Potential to Achieve Goals: Fair    Frequency Min 3X/week   Barriers to discharge        Co-evaluation               End of Session   Activity Tolerance: Patient tolerated treatment well;Patient limited by fatigue Patient left: in bed;with call bell/phone within reach Nurse Communication: Mobility status PT Visit Diagnosis: Pain;Unsteadiness on feet (R26.81);Muscle weakness (generalized) (M62.81) Pain - Right/Left:  (central) Pain - part of body:  (back)    Time: 4098-1191 PT Time Calculation (min) (ACUTE ONLY): 35 min   Charges:   PT Evaluation $PT Eval Low Complexity: 1 Procedure PT Treatments $Gait Training: 8-22 mins   PT G Codes:   PT G-Codes **NOT FOR INPATIENT CLASS** Functional Assessment Tool Used: Clinical judgement Functional Limitation: Mobility: Walking and moving around Mobility: Walking and Moving Around Current Status (Y7829): At least 40 percent but less than 60 percent impaired, limited or restricted Mobility: Walking and Moving Around Goal Status [phone]): At least 20 percent but less than 40 percent impaired, limited or restricted      [name] 10/22/2016, 12:37 PM

## 2016-10-22 NOTE — Clinical Social Work Note (Signed)
Clinical Social Work Assessment  Patient Details  Name: [name] [name] MRN: [id number] Date of Birth: 04-29-1931  Date of referral:  10/22/16               Reason for consult:  Facility Placement, Discharge Planning                Permission sought to share information with:  Facility Art therapist granted to share information::  Yes, Verbal Permission Granted  Name::        Agency::     Relationship::     Contact Information:     Housing/Transportation Living arrangements for the past 2 months:  Apartment Source of Information:  Patient Patient Interpreter Needed:  None Criminal Activity/Legal Involvement Pertinent to Current Situation/Hospitalization:  No - Comment as needed Significant Relationships:  Friend, Adult Children Lives with:  Self Do you feel safe going back to the place where you live?  No (SNF recommended.) Need for family participation in patient care:  No (Coment)  Care giving concerns: Pt's care cannot be managed at home following hospital d/c.   Social Worker assessment / plan: Pt hospitalized under observation from independent Living Community at [facility] with acute L2 fracture. PT has recommended ST Rehab at d/c. CSW met with pt to review recommendations. Pt is in agreement with ST Rehab and has accept rehab bed at [facility]. Pt will d/c to SNF today. CSW will assist with d/c planning to SNF.  Employment status:  Retired Forensic scientist:    PT Recommendations:  [place] / Referral to community resources:  [place]  Patient/Family's Response to care: Pt is in agreement with ST Rehab at [facility].  Patient/Family's Understanding of and Emotional Response to Diagnosis, Current Treatment, and Prognosis:  " I feel so much better than I did yesterday! " Pt is looking forward to having rehab prior to returning to her independent apt. Pt appreciates CSW assistance with d/c  planning.  Emotional Assessment Appearance:  Appears stated age Attitude/Demeanor/Rapport:  Other (cooperative) Affect (typically observed):  Pleasant, Calm Orientation:  Oriented to Self, Oriented to Place, Oriented to  Time, Oriented to Situation Alcohol / Substance use:  Not Applicable Psych involvement (Current and /or in the community):  No (Comment)  Discharge Needs  Concerns to be addressed:  Discharge Planning Concerns Readmission within the last 30 days:  No Current discharge risk:  None Barriers to Discharge:  No Barriers Identified   Luretha Rued, [place] 10/22/2016, 11:51 AM

## 2016-10-22 NOTE — Progress Notes (Signed)
Patient ID: [name] [name], female   DOB: 11-Nov-1930, 83 y.o.   MRN: [id number]    Referring Physician(s): Patel,P  Supervising Physician: Richarda Overlie  Patient Status:  [facility] - In-pt  Chief Complaint:  Back pain, lumbar compression fractures  Subjective: Patient familiar to IR service from prior T9 vertebroplasty by Dr. Corliss Skains in December 2016. She has a known history of osteopenia and was recently admitted with persistent back pain. According to the patient she began having some back spasms approximately 3 weeks ago which went away with conservative therapy. She began to experience back pain again yesterday which limited her mobility and presented to the hospital for further evaluation. She denied any recent falls or trauma. MRI of the lumbar spine today revealed: 1. L1 and L2 superior endplate fractures with a subacute appearance. Height loss is up to 30% at L2. Minimal retropulsion at L2 without impingement. 2. Degenerative changes without notable progression since 2016. L4-5 right foraminal impingement. 3. 13 mm cystic structure along the pancreatic tail is stable from 2016 and incidental. Could obtain 1 year follow-up MRI if appropriate for comorbidities.  Request now received from primary service for consideration of lumbar kyphoplasty/vertebroplasty on patient. Patient currently denies significant back pain. Her main complaint is nausea.  Allergies: Patient has no known allergies.  Medications: Prior to Admission medications   Medication Sig Start Date End Date Taking? Authorizing Provider  ALPRAZolam (XANAX) 0.25 MG tablet Take 0.25 mg by mouth 2 (two) times daily as needed for anxiety.  08/16/16  Yes Historical Provider, MD  amLODipine (NORVASC) 10 MG tablet Take 1 tablet (10 mg total) by mouth daily. 05/08/12  Yes Ripudeep Jenna Luo, MD  aspirin EC 81 MG tablet Take 81 mg by mouth daily.   Yes Historical Provider, MD  furosemide (LASIX) 20 MG tablet Take 20 mg by mouth daily as  needed for fluid or edema.   Yes Historical Provider, MD  levothyroxine (SYNTHROID, LEVOTHROID) 50 MCG tablet Take 50 mcg by mouth daily before breakfast.   Yes Historical Provider, MD  losartan (COZAAR) 100 MG tablet TAKE 1 TABLET BY MOUTH  DAILY 05/27/16  Yes Marykay Lex, MD  omeprazole (PRILOSEC) 40 MG capsule Take 40 mg by mouth daily.   Yes Historical Provider, MD  traMADol (ULTRAM) 50 MG tablet Take 50 mg by mouth every 8 (eight) hours as needed for moderate pain or severe pain.   Yes Historical Provider, MD  acetaminophen (TYLENOL) 325 MG tablet Take 650 mg by mouth every 6 (six) hours as needed for mild pain.     Historical Provider, MD  calcium carbonate (OS-CAL) 600 MG TABS Take 600 mg by mouth 2 (two) times daily with a meal.    Historical Provider, MD  cholecalciferol (VITAMIN D) 1000 UNITS tablet Take 1,000 Units by mouth daily.    Historical Provider, MD  diphenhydramine-acetaminophen (TYLENOL PM) 25-500 MG TABS Take 1 tablet by mouth at bedtime as needed (for sleep).    Historical Provider, MD  diphenoxylate-atropine (LOMOTIL) 2.5-0.025 MG tablet Take 1 tablet by mouth 4 (four) times daily as needed for diarrhea or loose stools. 09/06/16   Gilda Crease, MD  fish oil-omega-3 fatty acids 1000 MG capsule Take 1 g by mouth daily.    Historical Provider, MD  furosemide (LASIX) 20 MG tablet Take 1 tablet (20 mg total) by mouth as needed. 04/30/16 07/29/16  Marykay Lex, MD  Multiple Vitamins-Minerals (MULTIVITAMIN PO) Take 1 tablet by mouth daily.  Historical Provider, MD     Vital Signs: BP (!) 125/35 (BP Location: Right Arm)   Pulse 61   Temp 98.8 F (37.1 C) (Oral)   Resp 18   SpO2 98%   Physical Exam awake, alert. Patient moving all fours okay. sensory function okay. Cannot reproduce any significant paravertebral tenderness in the L1-2 region.  Imaging: Ct Thoracic Spine Wo Contrast  Result Date: 10/20/2016 CLINICAL DATA:  Sudden onset of low thoracic spine pain  and tenderness. History of compression fracture. EXAM: CT THORACIC SPINE WITHOUT CONTRAST TECHNIQUE: Multidetector CT images of the thoracic were obtained using the standard protocol without intravenous contrast. Imaging obtained from lower cervical spine through L2. COMPARISON:  Thoracic spine MRI 07/02/2015, no interval imaging. FINDINGS: Alignment: Mild exaggerated kyphosis, unchanged. Vertebrae: Mild superior endplate compression fracture of L2 with 25% loss of height anteriorly. There is minimal bony retropulsion superiorly. No evidence of extension through the posterior elements. Prominent Schmorl's node inferior endplate of L1. Remote T9 fracture with 75% loss of height and posterior element involvement. There has been vertebroplasty. Unchanged loss of height from prior. No remaining thoracic vertebral body heights are maintained scattered Schmorl's nodes. No bony destructive process. Paraspinal and other soft tissues: No canal hematoma. Linear and dependent lung base opacities favor atelectasis. Advanced atherosclerosis of the thoracoabdominal aorta. Disc levels: Thoracic disc spaces are preserved. Mild endplate spurring. Vacuum phenomenon noted at L1-L2. IMPRESSION: 1. Acute appearing L2 compression fracture with 25% loss of height anteriorly and mild bony retropulsion posteriorly. 2. Stable T9 compression fracture post vertebroplasty. Electronically Signed   By: Rubye Oaks M.D.   On: 10/20/2016 23:53   Mr Lumbar Spine Wo Contrast  Result Date: 10/21/2016 CLINICAL DATA:  L2 compression fracture with back pain. EXAM: MRI LUMBAR SPINE WITHOUT CONTRAST TECHNIQUE: Multiplanar, multisequence MR imaging of the lumbar spine was performed. No intravenous contrast was administered. COMPARISON:  07/02/2015 MRI.  Thoracic spine CT 10/20/2016 FINDINGS: Segmentation:  Standard. Alignment:  Mild scoliosis. Vertebrae: L1 and L2 superior endplate fractures with up to 30% height loss at L2. Both show mild marrow  edema and no convincing paravertebral edema. No evidence of underlying bone lesion. No discitis. Conus medullaris: Extends to the L1 level and appears normal. Paraspinal and other soft tissues: 13 mm cystic intensity structure which appears contiguous with the pancreatic duct the level of the tail, stable in retrospect when compared to 2016 MRI. Material within the lumen of the cecum is nonspecific, usually from stool. Disc levels: T12- L1: Unremarkable. L1-L2: Mild disc narrowing and bulging. No impingement. Mild ventral subarachnoid space effacement by L2 fracture deformity. L2-L3: Small right paracentral disc protrusion that is broad and extends to the foramen. No impingement L3-L4: Hyperintensity within the disc attributed to fissure. Mild disc narrowing and bulging. Mild facet spurring. No impingement L4-L5: Advanced disc narrowing that is asymmetric to the right where there is bulky far-lateral spurring and disc bulging. Facet arthropathy with asymmetric right facet spurring. Right foraminal L4 impingement. Central disc protrusion with mild spinal stenosis. L5-S1:Facet arthropathy with moderate spurring and joint fluid. No herniation or impingement. IMPRESSION: 1. L1 and L2 superior endplate fractures with a subacute appearance. Height loss is up to 30% at L2. Minimal retropulsion at L2 without impingement. 2. Degenerative changes without notable progression since 2016. L4-5 right foraminal impingement. 3. 13 mm cystic structure along the pancreatic tail is stable from 2016 and incidental. Could obtain 1 year follow-up MRI if appropriate for comorbidities. Electronically Signed   By: Marja Kays  Watts M.D.   On: 10/21/2016 12:20    Labs:  CBC:  Recent Labs  09/06/16 0234 10/20/16 2340 10/22/16 0421  WBC 5.4 7.4 4.9  HGB 11.2* 11.6* 10.3*  HCT 33.8* 33.2* 30.6*  PLT 79* 106* 97*    COAGS: No results for input(s): INR, APTT in the last 8760 hours.  BMP:  Recent Labs  09/06/16 0234  10/20/16 2340 10/22/16 0421  NA 136 131* 133*  K 3.1* 4.1 3.9  CL 105 99* 102  CO2 GLUCOSE 101* 113* 99  BUN 16 22* 13  CALCIUM 9.6 10.1 9.2  CREATININE 0.66 0.70 0.59  GFRNONAA >60 >60 >60  GFRAA >60 >60 >60    LIVER FUNCTION TESTS:  Recent Labs  09/06/16 0234  BILITOT 0.7  AST 45*  ALT 29  ALKPHOS 68  PROT 7.6  ALBUMIN 4.4    Assessment and Plan: Patient with history of back pain, prior T9 vertebroplasty in 2016. Now with newly noted L1 and L2 compression fractures. Imaging studies were reviewed by Dr. Bonnielee Haff today. Above fractures appear to be amenable to kyphoplasty, however the patient at this time denies significant back pain in the region. There is no significant paravertebral tenderness appreciated on exam today. She was able to get out of bed and walk a few steps with walker without significant back pain. Details/risks of kyphoplasty were again discussed with patient. At this time we will continue to monitor patient for worsening symptoms. If kyphoplasty performed insurance coverage will need to be assessed prior to procedure which may take a few days in view of upcoming holiday.   Electronically Signed: D. Jeananne Rama  10/21/16 4:01 pm I spent a total of 20 minutes at the the patient's bedside AND on the patient's hospital floor or unit, greater than 50% of which was counseling/coordinating care for possible L1-L2 kyphoplasty

## 2016-10-23 ENCOUNTER — Encounter (HOSPITAL_COMMUNITY): Payer: Self-pay | Admitting: Emergency Medicine

## 2016-10-23 ENCOUNTER — Emergency Department (HOSPITAL_COMMUNITY)
Admission: EM | Admit: 2016-10-23 | Discharge: 2016-10-24 | Disposition: A | Discharge status: Home / Self Care | Payer: Medicare Other | Attending: Emergency Medicine | Admitting: Emergency Medicine

## 2016-10-23 DIAGNOSIS — S32018D Other fracture of first lumbar vertebra, subsequent encounter for fracture with routine healing: Secondary | ICD-10-CM | POA: Diagnosis not present

## 2016-10-23 DIAGNOSIS — I10 Essential (primary) hypertension: Secondary | ICD-10-CM | POA: Diagnosis not present

## 2016-10-23 DIAGNOSIS — Z7982 Long term (current) use of aspirin: Secondary | ICD-10-CM | POA: Diagnosis not present

## 2016-10-23 DIAGNOSIS — Z96651 Presence of right artificial knee joint: Secondary | ICD-10-CM | POA: Diagnosis not present

## 2016-10-23 DIAGNOSIS — S3992XD Unspecified injury of lower back, subsequent encounter: Secondary | ICD-10-CM | POA: Diagnosis present

## 2016-10-23 DIAGNOSIS — Z79899 Other long term (current) drug therapy: Secondary | ICD-10-CM | POA: Insufficient documentation

## 2016-10-23 DIAGNOSIS — Z87891 Personal history of nicotine dependence: Secondary | ICD-10-CM | POA: Diagnosis not present

## 2016-10-23 DIAGNOSIS — X58XXXD Exposure to other specified factors, subsequent encounter: Secondary | ICD-10-CM | POA: Insufficient documentation

## 2016-10-23 DIAGNOSIS — E039 Hypothyroidism, unspecified: Secondary | ICD-10-CM | POA: Insufficient documentation

## 2016-10-23 DIAGNOSIS — S32010D Wedge compression fracture of first lumbar vertebra, subsequent encounter for fracture with routine healing: Secondary | ICD-10-CM

## 2016-10-23 MED ORDER — HYDROMORPHONE HCL 1 MG/ML IJ SOLN
0.7500 mg | Freq: Once | INTRAMUSCULAR | Status: AC
  Administered 2016-10-24: 0.75 mg via INTRAMUSCULAR
  Filled 2016-10-23: qty 1

## 2016-10-23 MED ORDER — LORAZEPAM 0.5 MG PO TABS
0.5000 mg | ORAL_TABLET | Freq: Once | ORAL | Status: AC
  Administered 2016-10-24: 0.5 mg via ORAL
  Filled 2016-10-23: qty 1

## 2016-10-23 MED ORDER — KETOROLAC TROMETHAMINE 15 MG/ML IJ SOLN
15.0000 mg | Freq: Once | INTRAMUSCULAR | Status: AC
  Administered 2016-10-24: 15 mg via INTRAMUSCULAR
  Filled 2016-10-23: qty 1

## 2016-10-23 MED ORDER — POLYETHYLENE GLYCOL 3350 17 G PO PACK
17.0000 g | PACK | Freq: Every day | ORAL | Status: DC
  Administered 2016-10-24: 17 g via ORAL
  Filled 2016-10-23: qty 1

## 2016-10-23 NOTE — ED Notes (Signed)
Bed: WA06 Expected date:  Expected time:  Means of arrival:  Comments: EMS 83 yo female lower back pain-was admitted 2 days ago for the back pain-from facility

## 2016-10-23 NOTE — ED Triage Notes (Signed)
Per [facility] pt from [facility] rehab, where she was sent after recent admission here for a possible lumbar fracture. Pt reports pain increasing and no relief with medications.
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
## 2016-10-24 ENCOUNTER — Emergency Department (HOSPITAL_COMMUNITY): Payer: Medicare Other

## 2016-10-24 MED ORDER — IBUPROFEN 200 MG PO TABS: 200.0000 mg | ORAL_TABLET | Freq: Four times a day (QID) | ORAL | 0 refills | Status: AC | PRN

## 2016-10-24 MED ORDER — OXYCODONE HCL 5 MG PO TABS
5.0000 mg | ORAL_TABLET | Freq: Once | ORAL | Status: AC
  Administered 2016-10-24: 5 mg via ORAL
  Filled 2016-10-24: qty 1

## 2016-10-24 MED ORDER — LIDOCAINE 5 % EX PTCH
1.0000 | MEDICATED_PATCH | Freq: Once | CUTANEOUS | Status: DC
  Administered 2016-10-24: 1 via TRANSDERMAL
  Filled 2016-10-24: qty 1

## 2016-10-24 MED ORDER — DIAZEPAM 5 MG PO TABS: 5.0000 mg | ORAL_TABLET | Freq: Three times a day (TID) | ORAL | 0 refills | Status: DC | PRN

## 2016-10-24 NOTE — ED Notes (Signed)
Pt informed of enema order & given option to use bedpan or BSC.  Pt chose BSC stating "I've been using the commode.  I'd rather not use the bedpan."

## 2016-10-24 NOTE — ED Notes (Signed)
Pt cleaned of urine. 

## 2016-10-24 NOTE — ED Notes (Signed)
Patient transported to CT 

## 2016-10-26 ENCOUNTER — Encounter: Payer: Self-pay | Admitting: Nurse Practitioner

## 2016-10-26 DIAGNOSIS — K5909 Other constipation: Secondary | ICD-10-CM | POA: Insufficient documentation

## 2016-10-27 ENCOUNTER — Other Ambulatory Visit: Payer: Self-pay | Admitting: Radiology

## 2016-10-27 ENCOUNTER — Other Ambulatory Visit (HOSPITAL_COMMUNITY): Payer: Self-pay | Admitting: Interventional Radiology

## 2016-10-27 DIAGNOSIS — IMO0001 Reserved for inherently not codable concepts without codable children: Secondary | ICD-10-CM

## 2016-10-27 DIAGNOSIS — M4850XA Collapsed vertebra, not elsewhere classified, site unspecified, initial encounter for fracture: Principal | ICD-10-CM

## 2016-10-28 ENCOUNTER — Encounter (HOSPITAL_COMMUNITY): Payer: Self-pay

## 2016-10-28 ENCOUNTER — Ambulatory Visit (HOSPITAL_COMMUNITY)
Admission: RE | Admit: 2016-10-28 | Discharge: 2016-10-28 | Disposition: A | Discharge status: Home / Self Care | Payer: Medicare Other | Source: Ambulatory Visit | Attending: Interventional Radiology | Admitting: Interventional Radiology

## 2016-10-28 ENCOUNTER — Other Ambulatory Visit (HOSPITAL_COMMUNITY): Payer: Self-pay | Admitting: Interventional Radiology

## 2016-10-28 DIAGNOSIS — H8110 Benign paroxysmal vertigo, unspecified ear: Secondary | ICD-10-CM | POA: Insufficient documentation

## 2016-10-28 DIAGNOSIS — M4856XA Collapsed vertebra, not elsewhere classified, lumbar region, initial encounter for fracture: Secondary | ICD-10-CM | POA: Insufficient documentation

## 2016-10-28 DIAGNOSIS — I10 Essential (primary) hypertension: Secondary | ICD-10-CM | POA: Insufficient documentation

## 2016-10-28 DIAGNOSIS — M4850XA Collapsed vertebra, not elsewhere classified, site unspecified, initial encounter for fracture: Principal | ICD-10-CM

## 2016-10-28 DIAGNOSIS — M5126 Other intervertebral disc displacement, lumbar region: Secondary | ICD-10-CM | POA: Insufficient documentation

## 2016-10-28 DIAGNOSIS — Z7982 Long term (current) use of aspirin: Secondary | ICD-10-CM | POA: Diagnosis not present

## 2016-10-28 DIAGNOSIS — M419 Scoliosis, unspecified: Secondary | ICD-10-CM | POA: Diagnosis not present

## 2016-10-28 DIAGNOSIS — K219 Gastro-esophageal reflux disease without esophagitis: Secondary | ICD-10-CM | POA: Insufficient documentation

## 2016-10-28 DIAGNOSIS — E039 Hypothyroidism, unspecified: Secondary | ICD-10-CM | POA: Insufficient documentation

## 2016-10-28 DIAGNOSIS — M48061 Spinal stenosis, lumbar region without neurogenic claudication: Secondary | ICD-10-CM | POA: Diagnosis not present

## 2016-10-28 DIAGNOSIS — E785 Hyperlipidemia, unspecified: Secondary | ICD-10-CM | POA: Insufficient documentation

## 2016-10-28 DIAGNOSIS — Z87891 Personal history of nicotine dependence: Secondary | ICD-10-CM | POA: Insufficient documentation

## 2016-10-28 DIAGNOSIS — M8580 Other specified disorders of bone density and structure, unspecified site: Secondary | ICD-10-CM | POA: Diagnosis not present

## 2016-10-28 DIAGNOSIS — IMO0001 Reserved for inherently not codable concepts without codable children: Secondary | ICD-10-CM

## 2016-10-28 DIAGNOSIS — F419 Anxiety disorder, unspecified: Secondary | ICD-10-CM | POA: Insufficient documentation

## 2016-10-28 HISTORY — PX: IR KYPHO LUMBAR INC FX REDUCE BONE BX UNI/BIL CANNULATION INC/IMAGING: IMG5519

## 2016-10-28 HISTORY — PX: IR KYPHO EA ADDL LEVEL THORACIC OR LUMBAR: IMG5520

## 2016-10-28 LAB — BASIC METABOLIC PANEL
ANION GAP: 8 (ref 5–15)
BUN: 16 mg/dL (ref 6–20)
CO2: 25 mmol/L (ref 22–32)
Calcium: 9.6 mg/dL (ref 8.9–10.3)
Chloride: 99 mmol/L — ABNORMAL LOW (ref 101–111)
Creatinine, Ser: 0.51 mg/dL (ref 0.44–1.00)
GFR calc Af Amer: >60 mL/min (ref >60)
Glucose, Bld: 109 mg/dL — ABNORMAL HIGH (ref 65–99)
POTASSIUM: 4.4 mmol/L (ref 3.5–5.1)
SODIUM: 132 mmol/L — AB (ref 135–145)

## 2016-10-28 LAB — CBC
HEMATOCRIT: 33.4 % — AB (ref 36.0–46.0)
HEMOGLOBIN: 10.8 g/dL — AB (ref 12.0–15.0)
MCH: 27.4 pg (ref 26.0–34.0)
MCHC: 32.3 g/dL (ref 30.0–36.0)
MCV: 84.8 fL (ref 78.0–100.0)
Platelets: 88 10*3/uL — ABNORMAL LOW (ref 150–400)
RBC: 3.94 MIL/uL (ref 3.87–5.11)
RDW: 17.4 % — ABNORMAL HIGH (ref 11.5–15.5)
WBC: 4.5 10*3/uL (ref 4.0–10.5)

## 2016-10-28 LAB — PROTIME-INR
INR: 0.98
Prothrombin Time: 13 seconds (ref 11.4–15.2)

## 2016-10-28 LAB — APTT: APTT: 34 s (ref 24–36)

## 2016-10-28 MED ORDER — FENTANYL CITRATE (PF) 100 MCG/2ML IJ SOLN
INTRAMUSCULAR | Status: DC | PRN
  Administered 2016-10-28 (×2): 25 ug via INTRAVENOUS
  Administered 2016-10-28: 50 ug via INTRAVENOUS

## 2016-10-28 MED ORDER — FENTANYL CITRATE (PF) 100 MCG/2ML IJ SOLN
INTRAMUSCULAR | Status: AC
  Filled 2016-10-28: qty 2

## 2016-10-28 MED ORDER — CEFAZOLIN SODIUM-DEXTROSE 2-4 GM/100ML-% IV SOLN: 2.0000 g | INTRAVENOUS | Status: DC

## 2016-10-28 MED ORDER — HYDROMORPHONE HCL 1 MG/ML IJ SOLN
INTRAMUSCULAR | Status: DC | PRN
  Administered 2016-10-28 (×2): 1 mg via INTRAVENOUS

## 2016-10-28 MED ORDER — IOPAMIDOL (ISOVUE-300) INJECTION 61%
INTRAVENOUS | Status: AC
  Administered 2016-10-28: 0.1 mL
  Filled 2016-10-28: qty 50

## 2016-10-28 MED ORDER — CEFAZOLIN SODIUM-DEXTROSE 2-4 GM/100ML-% IV SOLN
INTRAVENOUS | Status: AC
  Administered 2016-10-28: 11:00:00
  Filled 2016-10-28: qty 100

## 2016-10-28 MED ORDER — SODIUM CHLORIDE 0.9 % IV SOLN: INTRAVENOUS | Status: AC

## 2016-10-28 MED ORDER — BUPIVACAINE HCL (PF) 0.25 % IJ SOLN
INTRAMUSCULAR | Status: AC
  Filled 2016-10-28: qty 30

## 2016-10-28 MED ORDER — MIDAZOLAM HCL 2 MG/2ML IJ SOLN
INTRAMUSCULAR | Status: AC
  Filled 2016-10-28: qty 2

## 2016-10-28 MED ORDER — TOBRAMYCIN SULFATE 1.2 G IJ SOLR
INTRAMUSCULAR | Status: AC
  Filled 2016-10-28: qty 1.2

## 2016-10-28 MED ORDER — HYDROMORPHONE HCL 1 MG/ML IJ SOLN
INTRAMUSCULAR | Status: AC
  Filled 2016-10-28: qty 1

## 2016-10-28 MED ORDER — MIDAZOLAM HCL 2 MG/2ML IJ SOLN
INTRAMUSCULAR | Status: DC | PRN
  Administered 2016-10-28 (×5): 1 mg via INTRAVENOUS

## 2016-10-28 MED ORDER — BUPIVACAINE HCL (PF) 0.25 % IJ SOLN
INTRAMUSCULAR | Status: DC | PRN
  Administered 2016-10-28: 30 mL

## 2016-10-28 MED ORDER — TOBRAMYCIN SULFATE 1.2 G IJ SOLR
INTRAMUSCULAR | Status: DC | PRN
Start: 2016-10-28 — End: 2016-10-29
  Administered 2016-10-28: .1 g via TOPICAL

## 2016-10-28 MED ORDER — SODIUM CHLORIDE 0.9 % IV SOLN: INTRAVENOUS | Status: DC

## 2016-10-28 NOTE — Procedures (Signed)
S/P L1 and L2 balloon KPs.

## 2016-10-28 NOTE — Sedation Documentation (Signed)
When moving patient to table IV pulled out.  Attempted to restick with patient face down, unable to get IV.  Pt had to be put back on stretcher for IV restart.

## 2016-10-28 NOTE — Sedation Documentation (Signed)
IV restarted in right [facility]

## 2016-10-28 NOTE — Discharge Instructions (Addendum)
PATIENT IS TO BE CHECKED ON HOURLY THROUGH THE NIGHT OF 10/28/16 PER DR DEVESHRAW  Moderate Conscious Sedation, Adult, Care After These instructions provide you with information about caring for yourself after your procedure. Your health care provider may also give you more specific instructions. Your treatment has been planned according to current medical practices, but problems sometimes occur. Call your health care provider if you have any problems or questions after your procedure. What can I expect after the procedure? After your procedure, it is common:  To feel sleepy for several hours.  To feel clumsy and have poor balance for several hours.  To have poor judgment for several hours.  To vomit if you eat too soon. Follow these instructions at home: For at least 24 hours after the procedure:    Do not:  Participate in activities where you could fall or become injured.  Drive.  Use heavy machinery.  Drink alcohol.  Take sleeping pills or medicines that cause drowsiness.  Make important decisions or sign legal documents.  Take care of children on your own.  Rest. Eating and drinking   Follow the diet recommended by your health care provider.  If you vomit:  Drink water, juice, or soup when you can drink without vomiting.  Make sure you have little or no nausea before eating solid foods. General instructions   Have a responsible adult stay with you until you are awake and alert.  Take over-the-counter and prescription medicines only as told by your health care provider.  If you smoke, do not smoke without supervision.  Keep all follow-up visits as told by your health care provider. This is important. Contact a health care provider if:  You keep feeling nauseous or you keep vomiting.  You feel light-headed.  You develop a rash.  You have a fever. Get help right away if:  You have trouble breathing. This information is not intended to replace advice  given to you by your health care provider. Make sure you discuss any questions you have with your health care provider. Document Released: 05/02/2013 Document Revised: 12/15/2015 Document Reviewed: 11/01/2015 Elsevier Interactive Patient Education  2017 [employer] Balloon Kyphoplasty, Care After Refer to this sheet in the next few weeks. These instructions provide you with information about caring for yourself after your procedure. Your health care provider may also give you more specific instructions. Your treatment has been planned according to current medical practices, but problems sometimes occur. Call your health care provider if you have any problems or questions after your procedure. What can I expect after the procedure? After your procedure, it is common to have back pain. Follow these instructions at home: Incision care   Follow instructions from your health care provider about how to take care of your incisions. Make sure you:  Wash your hands with soap and water before you change your bandage (dressing). If soap and water are not available, use hand sanitizer.  Change your dressing as told by your health care provider.  Leave stitches (sutures), skin glue, or adhesive strips in place. These skin closures may need to be in place for 2 weeks or longer. If adhesive strip edges start to loosen and curl up, you may trim the loose edges. Do not remove adhesive strips completely unless your health care provider tells you to do that.  Check your incision area every day for signs of infection. Watch for:  Redness, swelling, or pain.  Fluid, blood, or pus.  Keep your dressing  dry until your health care provider says that it can be removed. Activity    Rest your back and avoid intense physical activity for as long as told by your health care provider.  Return to your normal activities as told by your health care provider. Ask your health care provider what activities are safe for  you.  Do not lift anything that is heavier than 10 lb (4.5 kg). This is about the weight of a gallon of milk.You may need to avoid heavy lifting for several weeks. General instructions   Take over-the-counter and prescription medicines only as told by your health care provider.  If directed, apply ice to the painful area:  Put ice in a plastic bag.  Place a towel between your skin and the bag.  Leave the ice on for 20 minutes, 2-3 times per day.  Do not use tobacco products, including cigarettes, chewing tobacco, or e-cigarettes. If you need help quitting, ask your health care provider.  Keep all follow-up visits as told by your health care provider. This is important. Contact a health care provider if:  You have a fever.  You have redness, swelling, or pain at the site of your incisions.  You have fluid, blood, or pus coming from your incisions.  You have pain that gets worse or does not get better with medicine.  You develop numbness or weakness in any part of your body. Get help right away if:  You have chest pain.  You have difficulty breathing.  You cannot move your legs.  You cannot control your bladder or bowel movements.  You suddenly become weak or numb on one side of your body.  You become very confused.  You have trouble speaking or understanding, or both. This information is not intended to replace advice given to you by your health care provider. Make sure you discuss any questions you have with your health care provider. Document Released: 04/02/2015 Document Revised: 12/18/2015 Document Reviewed: 11/04/2014 Elsevier Interactive Patient Education  2017 [employer] 1.No stooping,or bending or lifting more than 10 lbs for 2 weeks. 2.Use walker to ambulate for 2 weeks. 3.No driving for 2 weeks. 4.RTC PRN in 2 weeks

## 2016-10-28 NOTE — Sedation Documentation (Signed)
Patient placed back on procedure table

## 2016-10-28 NOTE — H&P (Signed)
Chief Complaint: Patient was seen in consultation today for Lumbar 1 and 2 kyphoplasty/vertebroplasty at the request of Dr Woody Seller  Referring Physician(s): Dr Eugenio Hoes  Supervising Physician: Luanne Bras  Patient Status: [facility] - Out-pt  History of Present Illness: [name] [name] is a 83 y.o. female   Hx Thoracic 9 VP 06/2015 Had great result with this procedure  New back pain x over 1 week No injury Was standing in kitchen and pain developed---worsened over night Has remained intolerable for days Medications without relief Can hardly move around now; can barely walk Pain is severe Was very active ; even in water aerobics until 2 weeks ago  CT 10/24/2016: IMPRESSION: Mild RIGHT flank contusion. Stable mild to moderate L1 and L2 fractures.  Osteopenia. Mild canal stenosis L4-5. Neural foraminal narrowing L2-3 through L4-5: Moderate to severe on the RIGHT at L4-5. Markedly distended urinary bladder, recommend correlation with urinary output. Stool distended rectum.  MRI 3/29: IMPRESSION: 1. L1 and L2 superior endplate fractures with a subacute appearance. Height loss is up to 30% at L2. Minimal retropulsion at L2 without impingement. 2. Degenerative changes without notable progression since 2016. L4-5 right foraminal impingement. 3. 13 mm cystic structure along the pancreatic tail is stable from 2016 and incidental. Could obtain 1 year follow-up MRI if appropriate for comorbidities.  Now scheduled for Lumbar 1 and 2 VP/KP  Past Medical History:  Diagnosis Date  . Anemia   . Anxiety   . Chronotropic incompetence 12/2012    potentially medication related; noted on CPET   . DDD (degenerative disc disease)   . Eczema   . GERD (gastroesophageal reflux disease)   . H/O hiatal hernia   . Hyperlipidemia   . Hypertension   . Hypothyroidism   . Osteopenia   . Septicemia ([place]) 2002   following UTI  . Vertigo, benign positional     Past Surgical History:    Procedure Laterality Date  . BREAST SURGERY     left biopsy  . CPET / MET - PFTS     Consistent with chronotropic incompetence; See attached report in the results section  . DILATION AND CURETTAGE OF UTERUS     x 2  . DOPPLER ECHOCARDIOGRAPHY  01/10/2013   Normal LV size and function. Normal EF. Air sclerosis but no stenosis  . ESOPHAGOGASTRODUODENOSCOPY  05/04/2012   Procedure: ESOPHAGOGASTRODUODENOSCOPY (EGD);  Surgeon: Inda Castle, MD;  Location: [facility];  Service: Endoscopy;  Laterality: N/A;  . EYE SURGERY     cataract extraction with ILO  ? eye  . KNEE ARTHROSCOPY  2011   right  . TONSILLECTOMY    . TOTAL KNEE ARTHROPLASTY  04/24/2012   Procedure: TOTAL KNEE ARTHROPLASTY;  Surgeon: Gearlean Alf, MD;  Location: [facility];  Service: Orthopedics;  Laterality: Right;    Allergies: Patient has no known allergies.  Medications: Prior to Admission medications   Medication Sig Start Date End Date Taking? Authorizing Provider  acetaminophen (TYLENOL) 325 MG tablet Take 650 mg by mouth every 6 (six) hours as needed for mild pain.    Yes Historical Provider, MD  ALPRAZolam (XANAX) 0.25 MG tablet Take 0.25 mg by mouth 2 (two) times daily as needed for anxiety.  08/16/16  Yes Historical Provider, MD  amLODipine (NORVASC) 10 MG tablet Take 1 tablet (10 mg total) by mouth daily. 05/08/12  Yes Ripudeep Krystal Eaton, MD  aspirin EC 81 MG tablet Take 81 mg by mouth daily.  Yes Historical Provider, MD  calcitonin, salmon, (MIACALCIN/FORTICAL) 200 UNIT/ACT nasal spray Place 1 spray into alternate nostrils daily. 10/23/16 10/29/16 Yes Lavina Hamman, MD  calcium carbonate (OS-CAL) 600 MG TABS Take 600 mg by mouth 2 (two) times daily with a meal.   Yes Historical Provider, MD  cholecalciferol (VITAMIN D) 1000 UNITS tablet Take 1,000 Units by mouth daily.   Yes Historical Provider, MD  diazepam (VALIUM) 5 MG tablet Take 1 tablet (5 mg total) by mouth every 8 (eight) hours as needed for anxiety or  muscle spasms. 10/24/16  Yes Virgel Manifold, MD  fish oil-omega-3 fatty acids 1000 MG capsule Take 1 g by mouth daily.   Yes Historical Provider, MD  furosemide (LASIX) 20 MG tablet Take 20 mg by mouth daily as needed for fluid or edema.   Yes Historical Provider, MD  HYDROcodone-acetaminophen (NORCO/VICODIN) 5-325 MG tablet Take 1 tablet by mouth every 6 (six) hours as needed for moderate pain. 10/22/16  Yes Lavina Hamman, MD  ibuprofen (ADVIL,MOTRIN) 200 MG tablet Take 1 tablet (200 mg total) by mouth every 6 (six) hours as needed. 10/24/16 10/29/16 Yes Virgel Manifold, MD  levothyroxine (SYNTHROID, LEVOTHROID) 50 MCG tablet Take 50 mcg by mouth daily before breakfast.   Yes Historical Provider, MD  losartan (COZAAR) 100 MG tablet TAKE 1 TABLET BY MOUTH  DAILY 05/27/16  Yes Leonie Man, MD  methocarbamol (ROBAXIN) 500 MG tablet Take 1 tablet (500 mg total) by mouth every 8 (eight) hours as needed for muscle spasms. 10/22/16  Yes Lavina Hamman, MD  Multiple Vitamin (MULTIVITAMIN WITH MINERALS) TABS tablet Take 1 tablet by mouth daily.   Yes Historical Provider, MD  omeprazole (PRILOSEC) 40 MG capsule Take 40 mg by mouth daily.   Yes Historical Provider, MD  polyethylene glycol (MIRALAX / GLYCOLAX) packet Take 17 g by mouth daily. 10/23/16  Yes Lavina Hamman, MD  traMADol (ULTRAM) 50 MG tablet Take 50 mg by mouth every 8 (eight) hours as needed for moderate pain or severe pain.   Yes Historical Provider, MD     Family History  Problem Relation Age of Onset  . Lung cancer Father 91    Died at age 66  . Stroke Mother 28    Died in her late 73s.  Marland Kitchen Heart attack Mother 48    Social History   Social History  . Marital status: Widowed    Spouse name: N/A  . Number of children: 2  . Years of education: N/A   Social History Main Topics  . Smoking status: Former Smoker    Years: 0.00    Types: Cigarettes    Quit date: 05/03/1965  . Smokeless tobacco: Never Used  . Alcohol use No     Comment:  occ glass wine  . Drug use: No  . Sexual activity: Not Asked   Other Topics Concern  . None   Social History Narrative   She is a widowed mother of 2.   She is a former smoker, with a distant history having quit in 1966.   She does drink an occasional glass of red wine.   She is just now started to try doing exercise with water aerobics.     Review of Systems: A 12 point ROS discussed and pertinent positives are indicated in the HPI above.  All other systems are negative.  Review of Systems  Constitutional: Positive for activity change. Negative for appetite change, fatigue and fever.  Cardiovascular:  Negative for chest pain.  Gastrointestinal: Negative for abdominal pain.  Musculoskeletal: Positive for back pain and gait problem.  Neurological: Positive for weakness.  Psychiatric/Behavioral: Negative for behavioral problems and confusion.    Vital Signs: BP (!) 168/56   Pulse 61   Temp 97.9 F (36.6 C)   Resp 18   Ht '5\' 4"'  (1.626 m)   Wt 151 lb (68.5 kg)   SpO2 100%   BMI 25.92 kg/m   Physical Exam  Cardiovascular: Normal rate and regular rhythm.   Pulmonary/Chest: Effort normal and breath sounds normal.  Abdominal: Soft. Bowel sounds are normal.  Musculoskeletal: Normal range of motion. She exhibits no edema.  Low back pain  Neurological: She is alert.  Skin: Skin is warm and dry.  Psychiatric: She has a normal mood and affect. Her behavior is normal. Judgment and thought content normal.  Nursing note and vitals reviewed.   Mallampati Score:  MD Evaluation Airway: WNL Heart: WNL Abdomen: WNL Chest/ Lungs: WNL ASA  Classification: 3 Mallampati/Airway Score: Two  Imaging: Ct Thoracic Spine Wo Contrast  Result Date: 10/20/2016 CLINICAL DATA:  Sudden onset of low thoracic spine pain and tenderness. History of compression fracture. EXAM: CT THORACIC SPINE WITHOUT CONTRAST TECHNIQUE: Multidetector CT images of the thoracic were obtained using the standard  protocol without intravenous contrast. Imaging obtained from lower cervical spine through L2. COMPARISON:  Thoracic spine MRI 07/02/2015, no interval imaging. FINDINGS: Alignment: Mild exaggerated kyphosis, unchanged. Vertebrae: Mild superior endplate compression fracture of L2 with 25% loss of height anteriorly. There is minimal bony retropulsion superiorly. No evidence of extension through the posterior elements. Prominent Schmorl's node inferior endplate of L1. Remote T9 fracture with 75% loss of height and posterior element involvement. There has been vertebroplasty. Unchanged loss of height from prior. No remaining thoracic vertebral body heights are maintained scattered Schmorl's nodes. No bony destructive process. Paraspinal and other soft tissues: No canal hematoma. Linear and dependent lung base opacities favor atelectasis. Advanced atherosclerosis of the thoracoabdominal aorta. Disc levels: Thoracic disc spaces are preserved. Mild endplate spurring. Vacuum phenomenon noted at L1-L2. IMPRESSION: 1. Acute appearing L2 compression fracture with 25% loss of height anteriorly and mild bony retropulsion posteriorly. 2. Stable T9 compression fracture post vertebroplasty. Electronically Signed   By: Jeb Levering M.D.   On: 10/20/2016 23:53   Ct Lumbar Spine Wo Contrast  Result Date: 10/24/2016 CLINICAL DATA:  Low back pain, follow-up subacute L2 compression fracture. EXAM: CT LUMBAR SPINE WITHOUT CONTRAST TECHNIQUE: Multidetector CT imaging of the lumbar spine was performed without intravenous contrast administration. Multiplanar CT image reconstructions were also generated. COMPARISON:  MRI of lumbar spine October 21, 2016 FINDINGS: SEGMENTATION: For the purposes of this report the last well-formed intervertebral disc space is reported as L5-S1 consistent with prior counting convention. ALIGNMENT: Maintained lumbar lordosis. No malalignment. S-type scoliosis of lumbar spine on this nonweightbearing  examination. VERTEBRAE: Stable mild to moderate L1 compression fracture without progressed height loss. Stable mild to moderate L2 compression fracture with minimal buckling the posterior cortex. No new fracture. Severe L5-S1 disc height loss toward the RIGHT associated with scoliosis. Remaining disc morphology generally preserved. Osteopenia without destructive bony lesions. PARASPINAL AND OTHER SOFT TISSUES: RIGHT flank subcutaneous fat stranding compatible with contusion. Severely distended urinary bladder. Ectatic infrarenal aorta with severe calcific atherosclerosis. Stool distended rectum measuring 6.4 cm. Duodenum diverticulum. DISC LEVELS: T12-L1: No disc bulge, canal stenosis nor neural foraminal narrowing. L1-2: Minimal retropulsed bony fragments mild canal stenosis without neural foraminal  narrowing. L2-3: Moderate broad-based disc bulge. No canal stenosis. Mild bilateral neural foraminal narrowing. L3-4: Small broad-based disc osteophyte complex asymmetric to LEFT. Mild facet arthropathy without canal stenosis. Mild LEFT neural foraminal narrowing. L4-5: Small to moderate broad-based disc osteophyte complex asymmetric to the RIGHT. Mild facet arthropathy and ligamentum flavum redundancy. Mild canal stenosis. Moderate to severe RIGHT and mild LEFT neural foraminal narrowing. L5-S1: Small broad-based disc bulge. Mild to moderate facet arthropathy and ligamentum flavum redundancy without canal stenosis or neural foraminal narrowing. IMPRESSION: Mild RIGHT flank contusion. Stable mild to moderate L1 and L2 fractures.  Osteopenia. Mild canal stenosis L4-5. Neural foraminal narrowing L2-3 through L4-5: Moderate to severe on the RIGHT at L4-5. Markedly distended urinary bladder, recommend correlation with urinary output. Stool distended rectum. Electronically Signed   By: Elon Alas M.D.   On: 10/24/2016 01:28   Mr Lumbar Spine Wo Contrast  Result Date: 10/21/2016 CLINICAL DATA:  L2 compression  fracture with back pain. EXAM: MRI LUMBAR SPINE WITHOUT CONTRAST TECHNIQUE: Multiplanar, multisequence MR imaging of the lumbar spine was performed. No intravenous contrast was administered. COMPARISON:  07/02/2015 MRI.  Thoracic spine CT 10/20/2016 FINDINGS: Segmentation:  Standard. Alignment:  Mild scoliosis. Vertebrae: L1 and L2 superior endplate fractures with up to 30% height loss at L2. Both show mild marrow edema and no convincing paravertebral edema. No evidence of underlying bone lesion. No discitis. Conus medullaris: Extends to the L1 level and appears normal. Paraspinal and other soft tissues: 13 mm cystic intensity structure which appears contiguous with the pancreatic duct the level of the tail, stable in retrospect when compared to 2016 MRI. Material within the lumen of the cecum is nonspecific, usually from stool. Disc levels: T12- L1: Unremarkable. L1-L2: Mild disc narrowing and bulging. No impingement. Mild ventral subarachnoid space effacement by L2 fracture deformity. L2-L3: Small right paracentral disc protrusion that is broad and extends to the foramen. No impingement L3-L4: Hyperintensity within the disc attributed to fissure. Mild disc narrowing and bulging. Mild facet spurring. No impingement L4-L5: Advanced disc narrowing that is asymmetric to the right where there is bulky far-lateral spurring and disc bulging. Facet arthropathy with asymmetric right facet spurring. Right foraminal L4 impingement. Central disc protrusion with mild spinal stenosis. L5-S1:Facet arthropathy with moderate spurring and joint fluid. No herniation or impingement. IMPRESSION: 1. L1 and L2 superior endplate fractures with a subacute appearance. Height loss is up to 30% at L2. Minimal retropulsion at L2 without impingement. 2. Degenerative changes without notable progression since 2016. L4-5 right foraminal impingement. 3. 13 mm cystic structure along the pancreatic tail is stable from 2016 and incidental. Could  obtain 1 year follow-up MRI if appropriate for comorbidities. Electronically Signed   By: Monte Fantasia M.D.   On: 10/21/2016 12:20    Labs:  CBC:  Recent Labs  09/06/16 0234 10/20/16 2340 10/22/16 0421  WBC 5.4 7.4 4.9  HGB 11.2* 11.6* 10.3*  HCT 33.8* 33.2* 30.6*  PLT 79* 106* 97*    COAGS:  Recent Labs  10/28/16 0854  INR 0.98  APTT 34    BMP:  Recent Labs  09/06/16 0234 10/20/16 2340 10/22/16 0421 10/28/16 0854  NA 136 131* 133* 132*  K 3.1* 4.1 3.9 4.4  CL 105 99* 102 99*  CO2 '23 25 26 25  ' GLUCOSE 101* 113* 99 109*  BUN 16 22* 13 16  CALCIUM 9.6 10.1 9.2 9.6  CREATININE 0.66 0.70 0.59 0.51  GFRNONAA >60 >60 >60 >60  GFRAA >60 >60 >  60 >60    LIVER FUNCTION TESTS:  Recent Labs  09/06/16 0234  BILITOT 0.7  AST 45*  ALT 29  ALKPHOS 68  PROT 7.6  ALBUMIN 4.4    TUMOR MARKERS: No results for input(s): AFPTM, CEA, CA199, CHROMGRNA in the last 8760 hours.  Assessment and Plan:  Lumbar 1 and 2 severely painful compression fractures Scheduled now for vertebroplasty/kyphoplasty Risks and Benefits discussed with the patient including, but not limited to education regarding the natural healing process of compression fractures without intervention, bleeding, infection, cement migration which may cause spinal cord damage, paralysis, pulmonary embolism or even death. All of the patient's questions were answered, patient is agreeable to proceed. Consent signed and in chart.  Thank you for this interesting consult.  I greatly enjoyed meeting Keyna Blizard Freyre and look forward to participating in their care.  A copy of this report was sent to the requesting provider on this date.  Electronically Signed: Sahaj Bona A 10/28/2016, 9:39 AM   I spent a total of  30 Minutes   in face to face in clinical consultation, greater than 50% of which was counseling/coordinating care for L1 and 2 VP/KP

## 2016-10-29 ENCOUNTER — Other Ambulatory Visit (HOSPITAL_COMMUNITY): Payer: Self-pay | Admitting: Interventional Radiology

## 2016-10-29 DIAGNOSIS — M4850XA Collapsed vertebra, not elsewhere classified, site unspecified, initial encounter for fracture: Principal | ICD-10-CM

## 2016-10-29 DIAGNOSIS — IMO0001 Reserved for inherently not codable concepts without codable children: Secondary | ICD-10-CM

## 2016-11-01 ENCOUNTER — Encounter (HOSPITAL_COMMUNITY): Payer: Self-pay | Admitting: Interventional Radiology

## 2016-11-11 ENCOUNTER — Ambulatory Visit (HOSPITAL_COMMUNITY)
Admission: RE | Admit: 2016-11-11 | Discharge: 2016-11-11 | Disposition: A | Discharge status: Home / Self Care | Payer: Medicare Other | Source: Ambulatory Visit | Attending: Interventional Radiology | Admitting: Interventional Radiology

## 2016-11-11 DIAGNOSIS — IMO0001 Reserved for inherently not codable concepts without codable children: Secondary | ICD-10-CM

## 2016-11-11 DIAGNOSIS — M4850XA Collapsed vertebra, not elsewhere classified, site unspecified, initial encounter for fracture: Principal | ICD-10-CM

## 2016-11-11 HISTORY — PX: IR RADIOLOGIST EVAL & MGMT: IMG5224

## 2016-11-15 LAB — CBC AND DIFFERENTIAL
HCT: 34 — AB (ref 36–46)
Hemoglobin: 10.9 — AB (ref 12.0–16.0)
PLATELETS: 84 — AB (ref 150–399)
WBC: 4.4

## 2016-11-15 LAB — BASIC METABOLIC PANEL
BUN: 15 (ref 4–21)
CREATININE: 0.7 (ref 0.5–1.1)
Glucose: 112
POTASSIUM: 4.6 (ref 3.4–5.3)
Sodium: 129 — AB (ref 137–147)

## 2016-11-23 ENCOUNTER — Telehealth (HOSPITAL_COMMUNITY): Payer: Self-pay

## 2016-11-23 ENCOUNTER — Encounter (HOSPITAL_COMMUNITY): Payer: Self-pay | Admitting: Interventional Radiology

## 2016-11-23 NOTE — Telephone Encounter (Signed)
Pt stated that she is feeling better and does not need to come back in at the moment. She will give [place] a call if things change. AW

## 2016-12-21 ENCOUNTER — Emergency Department (HOSPITAL_BASED_OUTPATIENT_CLINIC_OR_DEPARTMENT_OTHER): Payer: Medicare Other

## 2016-12-21 ENCOUNTER — Inpatient Hospital Stay (HOSPITAL_BASED_OUTPATIENT_CLINIC_OR_DEPARTMENT_OTHER)
Admission: EM | Admit: 2016-12-21 | Discharge: 2016-12-26 | DRG: 516 | Disposition: A | Discharge status: Home Health | Payer: Medicare Other | Attending: Internal Medicine | Admitting: Internal Medicine

## 2016-12-21 ENCOUNTER — Encounter (HOSPITAL_BASED_OUTPATIENT_CLINIC_OR_DEPARTMENT_OTHER): Payer: Self-pay

## 2016-12-21 DIAGNOSIS — F419 Anxiety disorder, unspecified: Secondary | ICD-10-CM | POA: Diagnosis present

## 2016-12-21 DIAGNOSIS — M4854XA Collapsed vertebra, not elsewhere classified, thoracic region, initial encounter for fracture: Secondary | ICD-10-CM | POA: Diagnosis not present

## 2016-12-21 DIAGNOSIS — F418 Other specified anxiety disorders: Secondary | ICD-10-CM | POA: Diagnosis present

## 2016-12-21 DIAGNOSIS — Z7982 Long term (current) use of aspirin: Secondary | ICD-10-CM

## 2016-12-21 DIAGNOSIS — R330 Drug induced retention of urine: Secondary | ICD-10-CM | POA: Diagnosis present

## 2016-12-21 DIAGNOSIS — M546 Pain in thoracic spine: Secondary | ICD-10-CM

## 2016-12-21 DIAGNOSIS — Z801 Family history of malignant neoplasm of trachea, bronchus and lung: Secondary | ICD-10-CM

## 2016-12-21 DIAGNOSIS — R531 Weakness: Secondary | ICD-10-CM

## 2016-12-21 DIAGNOSIS — M549 Dorsalgia, unspecified: Secondary | ICD-10-CM

## 2016-12-21 DIAGNOSIS — E785 Hyperlipidemia, unspecified: Secondary | ICD-10-CM | POA: Diagnosis present

## 2016-12-21 DIAGNOSIS — S32020A Wedge compression fracture of second lumbar vertebra, initial encounter for closed fracture: Secondary | ICD-10-CM

## 2016-12-21 DIAGNOSIS — T402X5A Adverse effect of other opioids, initial encounter: Secondary | ICD-10-CM | POA: Diagnosis present

## 2016-12-21 DIAGNOSIS — B954 Other streptococcus as the cause of diseases classified elsewhere: Secondary | ICD-10-CM | POA: Diagnosis present

## 2016-12-21 DIAGNOSIS — K5900 Constipation, unspecified: Secondary | ICD-10-CM | POA: Diagnosis present

## 2016-12-21 DIAGNOSIS — J9601 Acute respiratory failure with hypoxia: Secondary | ICD-10-CM | POA: Diagnosis present

## 2016-12-21 DIAGNOSIS — E039 Hypothyroidism, unspecified: Secondary | ICD-10-CM | POA: Diagnosis present

## 2016-12-21 DIAGNOSIS — N12 Tubulo-interstitial nephritis, not specified as acute or chronic: Secondary | ICD-10-CM

## 2016-12-21 DIAGNOSIS — Z87891 Personal history of nicotine dependence: Secondary | ICD-10-CM

## 2016-12-21 DIAGNOSIS — Y92019 Unspecified place in single-family (private) house as the place of occurrence of the external cause: Secondary | ICD-10-CM

## 2016-12-21 DIAGNOSIS — N10 Acute tubulo-interstitial nephritis: Secondary | ICD-10-CM | POA: Diagnosis present

## 2016-12-21 DIAGNOSIS — E871 Hypo-osmolality and hyponatremia: Secondary | ICD-10-CM | POA: Diagnosis present

## 2016-12-21 DIAGNOSIS — F329 Major depressive disorder, single episode, unspecified: Secondary | ICD-10-CM | POA: Diagnosis present

## 2016-12-21 DIAGNOSIS — K219 Gastro-esophageal reflux disease without esophagitis: Secondary | ICD-10-CM | POA: Diagnosis present

## 2016-12-21 DIAGNOSIS — D696 Thrombocytopenia, unspecified: Secondary | ICD-10-CM | POA: Diagnosis present

## 2016-12-21 DIAGNOSIS — I10 Essential (primary) hypertension: Secondary | ICD-10-CM | POA: Diagnosis present

## 2016-12-21 LAB — CBC
HEMATOCRIT: 33.1 % — AB (ref 36.0–46.0)
HEMOGLOBIN: 11.4 g/dL — AB (ref 12.0–15.0)
MCH: 29.2 pg (ref 26.0–34.0)
MCHC: 34.4 g/dL (ref 30.0–36.0)
MCV: 84.7 fL (ref 78.0–100.0)
Platelets: 94 10*3/uL — ABNORMAL LOW (ref 150–400)
RBC: 3.91 MIL/uL (ref 3.87–5.11)
RDW: 17.2 % — AB (ref 11.5–15.5)
WBC: 7.7 10*3/uL (ref 4.0–10.5)

## 2016-12-21 LAB — COMPREHENSIVE METABOLIC PANEL
ALBUMIN: 4.6 g/dL (ref 3.5–5.0)
ALK PHOS: 103 U/L (ref 38–126)
ALT: 23 U/L (ref 14–54)
AST: 37 U/L (ref 15–41)
Anion gap: 9 (ref 5–15)
BILIRUBIN TOTAL: 0.5 mg/dL (ref 0.3–1.2)
BUN: 15 mg/dL (ref 6–20)
CALCIUM: 9.9 mg/dL (ref 8.9–10.3)
CO2: 25 mmol/L (ref 22–32)
Chloride: 90 mmol/L — ABNORMAL LOW (ref 101–111)
Creatinine, Ser: 0.65 mg/dL (ref 0.44–1.00)
GFR calc Af Amer: >60 mL/min (ref >60)
GLUCOSE: 112 mg/dL — AB (ref 65–99)
Potassium: 5 mmol/L (ref 3.5–5.1)
Sodium: 124 mmol/L — ABNORMAL LOW (ref 135–145)
TOTAL PROTEIN: 7.7 g/dL (ref 6.5–8.1)

## 2016-12-21 LAB — URINALYSIS, ROUTINE W REFLEX MICROSCOPIC
Bilirubin Urine: NEGATIVE
GLUCOSE, UA: NEGATIVE mg/dL
Ketones, ur: NEGATIVE mg/dL
Nitrite: NEGATIVE
PROTEIN: NEGATIVE mg/dL
SPECIFIC GRAVITY, URINE: 1.007 (ref 1.005–1.030)
pH: 7 (ref 5.0–8.0)

## 2016-12-21 LAB — URINALYSIS, MICROSCOPIC (REFLEX): Squamous Epithelial / LPF: NONE SEEN

## 2016-12-21 MED ORDER — MORPHINE SULFATE (PF) 4 MG/ML IV SOLN
4.0000 mg | Freq: Once | INTRAVENOUS | Status: AC
  Administered 2016-12-21: 4 mg via INTRAVENOUS
  Filled 2016-12-21: qty 1

## 2016-12-21 MED ORDER — DEXTROSE 5 % IV SOLN
1.0000 g | Freq: Once | INTRAVENOUS | Status: AC
  Administered 2016-12-21: 1 g via INTRAVENOUS
  Filled 2016-12-21: qty 10

## 2016-12-21 MED ORDER — IOPAMIDOL (ISOVUE-300) INJECTION 61%
100.0000 mL | Freq: Once | INTRAVENOUS | Status: AC | PRN
  Administered 2016-12-21: 100 mL via INTRAVENOUS

## 2016-12-21 MED ORDER — KETOROLAC TROMETHAMINE 30 MG/ML IJ SOLN
30.0000 mg | Freq: Once | INTRAMUSCULAR | Status: AC
  Administered 2016-12-21: 30 mg via INTRAVENOUS
  Filled 2016-12-21: qty 1

## 2016-12-21 MED ORDER — ONDANSETRON HCL 4 MG/2ML IJ SOLN
INTRAMUSCULAR | Status: AC
  Filled 2016-12-21: qty 2

## 2016-12-21 MED ORDER — SODIUM CHLORIDE 0.9 % IV SOLN
INTRAVENOUS | Status: DC
  Administered 2016-12-22 – 2016-12-24 (×5): via INTRAVENOUS

## 2016-12-21 NOTE — ED Provider Notes (Signed)
[facility] DEPT [facility] Provider Note   CSN: [id number] Arrival date & time: 12/21/16  1913  By signing my name below, I, Levester Fresh, attest that this documentation has been prepared under the direction and in the presence of Jola Schmidt, MD . Electronically Signed: Levester Fresh, Scribe. 12/21/2016. 10:38 PM.  History   Chief Complaint Chief Complaint  Patient presents with  . Flank Pain   HPI Comments [name] [name] is a 83 y.o. female with a PMHx significant for HTN, osteopenia and multiple compression fracture repair surgeries, who presents to the Emergency Department with complaints of sudden onset bilateral flank pain x1 day. No radiation. No midline back pain.  No recent cough, but some associated dyspnea. No chest pain, headaches or abdominal pain. One episode of vomiting earlier this morning.  Increased urinary frequency x1 day.  No recent falls. Sx have been constant since onset, currently rated a 7/10 in severity.  Per son at bedside, pt is a poor historian, with recent compression fracture repair surgery utilizing cement and balloons on April 4th, 2018. Pt with residual intermittent pain since procedure, never fully managed by rx'd regimen of tramadol and Tylenol.  Pain worse with movement, especially transitioning from standing, sitting and laying down. Pt feels as if she may have over-exerted herself yesterday getting in and out of a low car multiple times. Pt on two other new medications, Cymbalta and Tymlos injections.  Pt denies experiencing any other acute sx. No known allergies.  The history is provided by the patient and medical records. No language interpreter was used.   Past Medical History:  Diagnosis Date  . Anemia   . Anxiety   . Chronotropic incompetence 12/2012    potentially medication related; noted on CPET   . DDD (degenerative disc disease)   . Eczema   . GERD (gastroesophageal reflux disease)   . H/O hiatal hernia   . Hyperlipidemia   .  Hypertension   . Hypothyroidism   . Osteopenia   . Septicemia ([place]) 2002   following UTI  . Vertigo, benign positional    Patient Active Problem List   Diagnosis Date Noted  . Constipation 10/26/2016  . Compression fracture of L2 lumbar vertebra, closed, initial encounter ([place]) 10/21/2016  . Hyponatremia 10/21/2016  . Thrombocytopenia ([place]) 10/21/2016  . Hypertensive heart disease 05/02/2016  . Bilateral leg edema 04/30/2016  . Exertional dyspnea 09/29/2013  . Essential hypertension 01/29/2013  . Fatigue with decreased exercise tolerance 12/25/2012  . Chronotropic incompetence 12/24/2012  . Aortic ejection murmur 12/22/2012  . Esophagitis 05/04/2012  . GI bleed 05/03/2012  . Anemia 05/03/2012  . Postop Transfusion 04/27/2012  . OA (osteoarthritis) of knee 04/24/2012   Past Surgical History:  Procedure Laterality Date  . BREAST SURGERY     left biopsy  . CPET / MET - PFTS     Consistent with chronotropic incompetence; See attached report in the results section  . DILATION AND CURETTAGE OF UTERUS     x 2  . DOPPLER ECHOCARDIOGRAPHY  01/10/2013   Normal LV size and function. Normal EF. Air sclerosis but no stenosis  . ESOPHAGOGASTRODUODENOSCOPY  05/04/2012   Procedure: ESOPHAGOGASTRODUODENOSCOPY (EGD);  Surgeon: Inda Castle, MD;  Location: [facility];  Service: Endoscopy;  Laterality: N/A;  . EYE SURGERY     cataract extraction with ILO  ? eye  . IR KYPHO EA ADDL LEVEL THORACIC OR LUMBAR  10/28/2016  . IR KYPHO LUMBAR INC FX REDUCE BONE BX UNI/BIL  CANNULATION INC/IMAGING  10/28/2016  . IR RADIOLOGIST EVAL & MGMT  11/11/2016  . KNEE ARTHROSCOPY  2011   right  . TONSILLECTOMY    . TOTAL KNEE ARTHROPLASTY  04/24/2012   Procedure: TOTAL KNEE ARTHROPLASTY;  Surgeon: Gearlean Alf, MD;  Location: [facility];  Service: Orthopedics;  Laterality: Right;   OB History    No data available     Home Medications    Prior to Admission medications   Medication Sig Start Date End  Date Taking? Authorizing Provider  Abaloparatide (TYMLOS ) Inject into the skin.   Yes [provider]  DULoxetine HCl (CYMBALTA PO) Take by mouth.   Yes [provider]  acetaminophen (TYLENOL) 325 MG tablet Take 650 mg by mouth every 6 (six) hours as needed for mild pain.     [provider]  ALPRAZolam Duanne Moron) 0.25 MG tablet Take 0.25 mg by mouth 2 (two) times daily as needed for anxiety.  08/16/16   [provider]  amLODipine (NORVASC) 10 MG tablet Take 1 tablet (10 mg total) by mouth daily. 05/08/12   Rai, Vernelle Emerald, MD  aspirin EC 81 MG tablet Take 81 mg by mouth daily.    [provider]  calcium carbonate (OS-CAL) 600 MG TABS Take 600 mg by mouth 2 (two) times daily with a meal.    [provider]  cholecalciferol (VITAMIN D) 1000 UNITS tablet Take 1,000 Units by mouth daily.    [provider]  diazepam (VALIUM) 5 MG tablet Take 1 tablet (5 mg total) by mouth every 8 (eight) hours as needed for anxiety or muscle spasms. 10/24/16   Virgel Manifold, MD  fish oil-omega-3 fatty acids 1000 MG capsule Take 1 g by mouth daily.    [provider]  furosemide (LASIX) 20 MG tablet Take 20 mg by mouth daily as needed for fluid or edema.    [provider]  HYDROcodone-acetaminophen (NORCO/VICODIN) 5-325 MG tablet Take 1 tablet by mouth every 6 (six) hours as needed for moderate pain. 10/22/16   Lavina Hamman, MD  levothyroxine (SYNTHROID, LEVOTHROID) 50 MCG tablet Take 50 mcg by mouth daily before breakfast.    [provider]  losartan (COZAAR) 100 MG tablet TAKE 1 TABLET BY MOUTH  DAILY 05/27/16   Leonie Man, MD  methocarbamol (ROBAXIN) 500 MG tablet Take 1 tablet (500 mg total) by mouth every 8 (eight) hours as needed for muscle spasms. 10/22/16   Lavina Hamman, MD  Multiple Vitamin (MULTIVITAMIN WITH MINERALS) TABS tablet Take 1 tablet by mouth daily.    [provider]  omeprazole (PRILOSEC)  40 MG capsule Take 40 mg by mouth daily.    [provider]  polyethylene glycol (MIRALAX / GLYCOLAX) packet Take 17 g by mouth daily. 10/23/16   Lavina Hamman, MD  traMADol (ULTRAM) 50 MG tablet Take 50 mg by mouth every 8 (eight) hours as needed for moderate pain or severe pain.    [provider]   Family History Family History  Problem Relation Age of Onset  . Lung cancer Father 31       Died at age 55  . Stroke Mother 41       Died in her late 43s.  Marland Kitchen Heart attack Mother 59   Social History Social History  Substance Use Topics  . Smoking status: Former Smoker    Years: 0.00    Types: Cigarettes    Quit date: 05/03/1965  .  Smokeless tobacco: Never Used  . Alcohol use Yes     Comment: occ glass wine   Allergies   Patient has no known allergies.  Review of Systems Review of Systems   All systems reviewed and are negative for acute change except as noted in the HPI.  Physical Exam Updated Vital Signs BP (!) 167/61 (BP Location: Left Arm)   Pulse 74   Temp 98.1 F (36.7 C) (Oral)   Resp 12   Wt 148 lb (67.1 kg)   SpO2 96%   BMI 25.40 kg/m   Physical Exam  Constitutional: She is oriented to person, place, and time. She appears well-developed and well-nourished. No distress.  HENT:  Head: Normocephalic and atraumatic.  Eyes: EOM are normal.  Neck: Normal range of motion.  Cardiovascular: Normal rate, regular rhythm and normal heart sounds.   Pulmonary/Chest: Effort normal and breath sounds normal.  Abdominal: Soft. She exhibits no distension. There is no tenderness.  Musculoskeletal: Normal range of motion.  No thoracic or lumbar point tenderness. Mild prelumbar tenderness bilaterally without spasms.  Neurological: She is alert and oriented to person, place, and time.  Skin: Skin is warm and dry.  Psychiatric: She has a normal mood and affect. Judgment normal.  Nursing note and vitals reviewed.  ED Treatments / Results  DIAGNOSTIC  STUDIES: Oxygen Saturation is 100% on room air, normal by my interpretation.    COORDINATION OF CARE: 7:54 PM Discussed treatment plan with pt, son and daughter-in law at bedside. They agreed to plan.  Labs (all labs ordered are listed, but only abnormal results are displayed) Labs Reviewed  URINALYSIS, ROUTINE W REFLEX MICROSCOPIC - Abnormal; Notable for the following:       Result Value   APPearance CLOUDY (*)    Hgb urine dipstick SMALL (*)    Leukocytes, UA LARGE (*)    All other components within normal limits  CBC - Abnormal; Notable for the following:    Hemoglobin 11.4 (*)    HCT 33.1 (*)    RDW 17.2 (*)    Platelets 94 (*)    All other components within normal limits  COMPREHENSIVE METABOLIC PANEL - Abnormal; Notable for the following:    Sodium 124 (*)    Chloride 90 (*)    Glucose, Bld 112 (*)    All other components within normal limits  URINALYSIS, MICROSCOPIC (REFLEX) - Abnormal; Notable for the following:    Bacteria, UA MANY (*)    All other components within normal limits  URINE CULTURE    EKG  EKG Interpretation None       Radiology Ct Abdomen Pelvis W Contrast  Result Date: 12/21/2016 CLINICAL DATA:  Bilateral flank pain starting last night with vomiting x1. EXAM: CT ABDOMEN AND PELVIS WITH CONTRAST TECHNIQUE: Multidetector CT imaging of the abdomen and pelvis was performed using the standard protocol following bolus administration of intravenous contrast. CONTRAST:  137m ISOVUE-300 IOPAMIDOL (ISOVUE-300) INJECTION 61% COMPARISON:  05/26/2010 CT FINDINGS: Lower chest: Normal visualized cardiac chambers. No pericardial effusion. Coronary arteriosclerosis is noted the circumflex. Hepatobiliary: Homogeneous attenuation of the liver without biliary dilatation. Moderate gallbladder distention with portions of the gallbladder interposed between the liver and ventral abdominal wall. No wall thickening or pericholecystic fluid. No gallstones are noted. Pancreas:  No focal pancreatic mass. Mild ductal ectasia to 4 mm. No pancreatic inflammation. Spleen: No splenic mass or enlargement. Adrenals/Urinary Tract: Normal bilateral adrenal glands and kidneys. Extrarenal pelvis on the left. Distended urinary bladder without  calculus or mass. Stomach/Bowel: Contrast distention of the stomach. Normal small bowel rotation. Increased colonic stool burden consistent with constipation. No bowel obstruction or inflammation. Normal appendix. Vascular/Lymphatic: Aortoiliac atherosclerosis without aneurysmal dilatation. The abdominal aorta is ectatic in appearance. Reproductive: Normal uterus.  No adnexal mass. Other: No abdominal wall hernia or abnormality. No abdominopelvic ascites. Musculoskeletal: L1 and L2 vertebral augmentation for compression fractures. Dextroscoliosis the lumbar spine. No acute nor suspicious osseous abnormalities. IMPRESSION: 1. Coronary arteriosclerosis and aortic atherosclerosis. 2. Moderate gallbladder distention without secondary signs of acute cholecystitis. There is interposition of a portion of the gallbladder between the liver and ventral abdominal wall which might contribute pain with compression in the right upper quadrant. 3. No nephrolithiasis, renal mass or obstructive uropathy, however the urinary bladder is moderately distended without source of obstruction noted. If the patient is unable to void, Foley decompression may prove useful. 4. Moderate colonic stool burden consistent with constipation. No bowel inflammation. 5. L1 and L2 vertebral body augmentation secondary to compression fractures. Dextroscoliosis of the lumbar spine. Electronically Signed   By: Ashley Royalty M.D.   On: 12/21/2016 22:28    Procedures Procedures (including critical care time)  Medications Ordered in ED Medications  ondansetron (ZOFRAN) 4 MG/2ML injection (not administered)  cefTRIAXone (ROCEPHIN) 1 g in dextrose 5 % 50 mL IVPB (1 g Intravenous New Bag/Given 12/21/16  2300)  morphine 4 MG/ML injection 4 mg (4 mg Intravenous Given 12/21/16 2030)  ketorolac (TORADOL) 30 MG/ML injection 30 mg (30 mg Intravenous Given 12/21/16 2029)  morphine 4 MG/ML injection 4 mg (4 mg Intravenous Given 12/21/16 2121)  iopamidol (ISOVUE-300) 61 % injection 100 mL (100 mLs Intravenous Contrast Given 12/21/16 2203)     Initial Impression / Assessment and Plan / ED Course  I have reviewed the triage vital signs and the nursing notes.  Pertinent labs & imaging results that were available during my care of the patient were reviewed by me and considered in my medical decision making (see chart for details).    Patient presents with increasing generalized weakness and hyponatremia as well as urinary tract infection.  Rocephin now.  Urine culture sent.  Patient be admitted the hospital for ongoing pain control and treatment of her UTI.  O2 sats noted to be somewhat low.  Chest x-ray will be added on.   I personally performed the services described in this documentation, which was scribed in my presence. The recorded information has been reviewed and is accurate.   Final Clinical Impressions(s) / ED Diagnoses   Final diagnoses:  Pyelonephritis  Hyponatremia  Weakness    New Prescriptions New Prescriptions   No medications on file     Jola Schmidt, MD 12/21/16 2305

## 2016-12-21 NOTE — ED Notes (Signed)
Pt drinking oral contrast at this time. 

## 2016-12-21 NOTE — ED Notes (Signed)
ED Provider at bedside. 

## 2016-12-21 NOTE — Progress Notes (Signed)
83 yo woman with a history of HTN, HLD, and hypothyroidism presenting with several days of nausea, vomiting, and flank pain.  Diagnosis is acute pyelonephritis.  U/A shows large leukocytes, TNTC RBC and WBC, and many bacteria.  She has also a sodium of 124 and chloride of 90.  Normal renal function.  She has received IV Rocephin and analgesics in the ED.  No signs of sepsis.  Lactic acid level was not ordered.  Will accept as observation status for now, telemetry.

## 2016-12-21 NOTE — ED Notes (Signed)
Pt up to bedside commode. During transfer pt had significant stress incontinence. Pt offered to have Pure Wick external urinary catheter placed and pt agreed.

## 2016-12-21 NOTE — ED Notes (Signed)
Pt son and daughter-in-law left contact information. Daughter-in-law's cell phone # is [phone] - Dino Miyasato

## 2016-12-21 NOTE — ED Triage Notes (Signed)
C/o bilat flank pain-started last night-vomited x1-urinary freq-NAD-presents to triage in w/c

## 2016-12-22 ENCOUNTER — Encounter (HOSPITAL_COMMUNITY): Payer: Self-pay

## 2016-12-22 DIAGNOSIS — D696 Thrombocytopenia, unspecified: Secondary | ICD-10-CM

## 2016-12-22 DIAGNOSIS — F418 Other specified anxiety disorders: Secondary | ICD-10-CM | POA: Diagnosis not present

## 2016-12-22 DIAGNOSIS — K219 Gastro-esophageal reflux disease without esophagitis: Secondary | ICD-10-CM | POA: Diagnosis present

## 2016-12-22 DIAGNOSIS — N10 Acute tubulo-interstitial nephritis: Secondary | ICD-10-CM | POA: Diagnosis not present

## 2016-12-22 DIAGNOSIS — J9601 Acute respiratory failure with hypoxia: Secondary | ICD-10-CM | POA: Diagnosis present

## 2016-12-22 DIAGNOSIS — I10 Essential (primary) hypertension: Secondary | ICD-10-CM

## 2016-12-22 LAB — CBC
HEMATOCRIT: 33.4 % — AB (ref 36.0–46.0)
HEMOGLOBIN: 11.4 g/dL — AB (ref 12.0–15.0)
MCH: 29 pg (ref 26.0–34.0)
MCHC: 34.1 g/dL (ref 30.0–36.0)
MCV: 85 fL (ref 78.0–100.0)
Platelets: 85 10*3/uL — ABNORMAL LOW (ref 150–400)
RBC: 3.93 MIL/uL (ref 3.87–5.11)
RDW: 17.2 % — ABNORMAL HIGH (ref 11.5–15.5)
WBC: 5.9 10*3/uL (ref 4.0–10.5)

## 2016-12-22 LAB — BASIC METABOLIC PANEL
ANION GAP: 8 (ref 5–15)
BUN: 11 mg/dL (ref 6–20)
CHLORIDE: 93 mmol/L — AB (ref 101–111)
CO2: 26 mmol/L (ref 22–32)
Calcium: 9.4 mg/dL (ref 8.9–10.3)
Creatinine, Ser: 0.46 mg/dL (ref 0.44–1.00)
GFR calc Af Amer: >60 mL/min (ref >60)
GLUCOSE: 101 mg/dL — AB (ref 65–99)
POTASSIUM: 3.9 mmol/L (ref 3.5–5.1)
Sodium: 127 mmol/L — ABNORMAL LOW (ref 135–145)

## 2016-12-22 LAB — MRSA PCR SCREENING: MRSA by PCR: NEGATIVE

## 2016-12-22 LAB — OSMOLALITY, URINE: OSMOLALITY UR: 267 mosm/kg — AB (ref 300–900)

## 2016-12-22 LAB — LACTIC ACID, PLASMA: Lactic Acid, Venous: 1.1 mmol/L (ref 0.5–1.9)

## 2016-12-22 LAB — SODIUM, URINE, RANDOM: Sodium, Ur: 75 mmol/L

## 2016-12-22 MED ORDER — ALPRAZOLAM 0.25 MG PO TABS
0.2500 mg | ORAL_TABLET | Freq: Two times a day (BID) | ORAL | Status: DC | PRN
  Administered 2016-12-23 – 2016-12-25 (×2): 0.25 mg via ORAL
  Filled 2016-12-22 (×2): qty 1

## 2016-12-22 MED ORDER — ZOLPIDEM TARTRATE 5 MG PO TABS: 5.0000 mg | ORAL_TABLET | Freq: Every evening | ORAL | Status: DC | PRN

## 2016-12-22 MED ORDER — DEXTROSE 5 % IV SOLN
1.0000 g | INTRAVENOUS | Status: DC
  Administered 2016-12-22 – 2016-12-23 (×2): 1 g via INTRAVENOUS
  Filled 2016-12-22 (×3): qty 10

## 2016-12-22 MED ORDER — CALCIUM CARBONATE 1250 (500 CA) MG PO TABS
1250.0000 mg | ORAL_TABLET | Freq: Two times a day (BID) | ORAL | Status: DC
  Administered 2016-12-22 – 2016-12-26 (×8): 1250 mg via ORAL
  Filled 2016-12-22 (×8): qty 1

## 2016-12-22 MED ORDER — SENNOSIDES-DOCUSATE SODIUM 8.6-50 MG PO TABS
1.0000 | ORAL_TABLET | Freq: Two times a day (BID) | ORAL | Status: DC
  Administered 2016-12-22 – 2016-12-25 (×6): 1 via ORAL
  Filled 2016-12-22 (×6): qty 1

## 2016-12-22 MED ORDER — AMLODIPINE BESYLATE 5 MG PO TABS
10.0000 mg | ORAL_TABLET | Freq: Once | ORAL | Status: AC
  Administered 2016-12-22: 10 mg via ORAL
  Filled 2016-12-22: qty 2

## 2016-12-22 MED ORDER — AMLODIPINE BESYLATE 10 MG PO TABS
10.0000 mg | ORAL_TABLET | Freq: Every day | ORAL | Status: DC
  Filled 2016-12-22: qty 1

## 2016-12-22 MED ORDER — ONDANSETRON HCL 4 MG/2ML IJ SOLN
4.0000 mg | Freq: Four times a day (QID) | INTRAMUSCULAR | Status: DC | PRN
  Administered 2016-12-22 – 2016-12-23 (×2): 4 mg via INTRAVENOUS
  Filled 2016-12-22 (×2): qty 2

## 2016-12-22 MED ORDER — HYDRALAZINE HCL 20 MG/ML IJ SOLN
5.0000 mg | INTRAMUSCULAR | Status: DC | PRN
  Filled 2016-12-22: qty 0.25

## 2016-12-22 MED ORDER — OMEGA-3-ACID ETHYL ESTERS 1 G PO CAPS
1.0000 g | ORAL_CAPSULE | Freq: Every day | ORAL | Status: DC
  Administered 2016-12-22 – 2016-12-26 (×5): 1 g via ORAL
  Filled 2016-12-22 (×5): qty 1

## 2016-12-22 MED ORDER — METHOCARBAMOL 500 MG PO TABS
500.0000 mg | ORAL_TABLET | Freq: Three times a day (TID) | ORAL | Status: DC | PRN
  Administered 2016-12-23 – 2016-12-25 (×4): 500 mg via ORAL
  Filled 2016-12-22 (×4): qty 1

## 2016-12-22 MED ORDER — LOSARTAN POTASSIUM 50 MG PO TABS
100.0000 mg | ORAL_TABLET | Freq: Every day | ORAL | Status: DC
  Administered 2016-12-22 – 2016-12-26 (×5): 100 mg via ORAL
  Filled 2016-12-22 (×5): qty 2

## 2016-12-22 MED ORDER — MORPHINE SULFATE (PF) 4 MG/ML IV SOLN
4.0000 mg | Freq: Once | INTRAVENOUS | Status: AC
  Administered 2016-12-22: 4 mg via INTRAVENOUS
  Filled 2016-12-22: qty 1

## 2016-12-22 MED ORDER — AMLODIPINE BESYLATE 10 MG PO TABS
10.0000 mg | ORAL_TABLET | Freq: Every day | ORAL | Status: DC
  Administered 2016-12-22 – 2016-12-25 (×4): 10 mg via ORAL
  Filled 2016-12-22 (×4): qty 1

## 2016-12-22 MED ORDER — DULOXETINE HCL 20 MG PO CPEP
20.0000 mg | ORAL_CAPSULE | Freq: Every day | ORAL | Status: DC
Start: 2016-12-22 — End: 2016-12-26
  Administered 2016-12-22 – 2016-12-26 (×5): 20 mg via ORAL
  Filled 2016-12-22 (×5): qty 1

## 2016-12-22 MED ORDER — VITAMIN B-12 1000 MCG PO TABS
1000.0000 ug | ORAL_TABLET | Freq: Every day | ORAL | Status: DC
  Administered 2016-12-22 – 2016-12-26 (×5): 1000 ug via ORAL
  Filled 2016-12-22 (×5): qty 1

## 2016-12-22 MED ORDER — OXYCODONE-ACETAMINOPHEN 5-325 MG PO TABS
1.0000 | ORAL_TABLET | ORAL | Status: DC | PRN
  Administered 2016-12-22 – 2016-12-25 (×11): 1 via ORAL
  Filled 2016-12-22 (×12): qty 1

## 2016-12-22 MED ORDER — SENNOSIDES-DOCUSATE SODIUM 8.6-50 MG PO TABS: 1.0000 | ORAL_TABLET | Freq: Every evening | ORAL | Status: DC | PRN

## 2016-12-22 MED ORDER — POLYETHYLENE GLYCOL 3350 17 G PO PACK
17.0000 g | PACK | Freq: Every day | ORAL | Status: DC
  Administered 2016-12-22 – 2016-12-26 (×5): 17 g via ORAL
  Filled 2016-12-22 (×5): qty 1

## 2016-12-22 MED ORDER — DIAZEPAM 5 MG PO TABS: 5.0000 mg | ORAL_TABLET | Freq: Three times a day (TID) | ORAL | Status: DC | PRN

## 2016-12-22 MED ORDER — ONDANSETRON HCL 4 MG PO TABS: 4.0000 mg | ORAL_TABLET | Freq: Four times a day (QID) | ORAL | Status: DC | PRN

## 2016-12-22 MED ORDER — ASPIRIN EC 81 MG PO TBEC
81.0000 mg | DELAYED_RELEASE_TABLET | Freq: Every day | ORAL | Status: DC
  Administered 2016-12-22 – 2016-12-26 (×5): 81 mg via ORAL
  Filled 2016-12-22 (×5): qty 1

## 2016-12-22 MED ORDER — ENSURE ENLIVE PO LIQD: 237.0000 mL | Freq: Every day | ORAL | Status: DC | PRN

## 2016-12-22 MED ORDER — LEVOTHYROXINE SODIUM 50 MCG PO TABS
50.0000 ug | ORAL_TABLET | Freq: Every day | ORAL | Status: DC
  Administered 2016-12-22 – 2016-12-23 (×2): 50 ug via ORAL
  Filled 2016-12-22 (×2): qty 1

## 2016-12-22 MED ORDER — LIDOCAINE 4 % EX CREA
TOPICAL_CREAM | Freq: Every day | CUTANEOUS | Status: DC | PRN
  Filled 2016-12-22: qty 5

## 2016-12-22 MED ORDER — PANTOPRAZOLE SODIUM 40 MG PO TBEC
40.0000 mg | DELAYED_RELEASE_TABLET | Freq: Every day | ORAL | Status: DC
  Administered 2016-12-23 – 2016-12-26 (×4): 40 mg via ORAL
  Filled 2016-12-22 (×5): qty 1

## 2016-12-22 MED ORDER — ACETAMINOPHEN 325 MG PO TABS
650.0000 mg | ORAL_TABLET | Freq: Four times a day (QID) | ORAL | Status: DC | PRN
  Administered 2016-12-23 – 2016-12-26 (×3): 650 mg via ORAL
  Filled 2016-12-22 (×4): qty 2

## 2016-12-22 MED ORDER — VITAMIN D3 25 MCG (1000 UNIT) PO TABS
1000.0000 [IU] | ORAL_TABLET | Freq: Every day | ORAL | Status: DC
  Administered 2016-12-22 – 2016-12-26 (×5): 1000 [IU] via ORAL
  Filled 2016-12-22 (×5): qty 1

## 2016-12-22 MED ORDER — ADULT MULTIVITAMIN W/MINERALS CH
1.0000 | ORAL_TABLET | Freq: Every day | ORAL | Status: DC
  Administered 2016-12-22 – 2016-12-26 (×5): 1 via ORAL
  Filled 2016-12-22 (×5): qty 1

## 2016-12-22 MED ORDER — ABALOPARATIDE 3120 MCG/1.56ML ~~LOC~~ SOPN
80.0000 ug | PEN_INJECTOR | Freq: Every day | SUBCUTANEOUS | Status: DC
  Administered 2016-12-22 – 2016-12-26 (×5): 80 ug via INTRAMUSCULAR

## 2016-12-22 MED ORDER — ENSURE ENLIVE PO LIQD
237.0000 mL | Freq: Two times a day (BID) | ORAL | Status: DC
  Administered 2016-12-22: 237 mL via ORAL

## 2016-12-22 MED ORDER — LIDOCAINE 5 % EX OINT
TOPICAL_OINTMENT | Freq: Every day | CUTANEOUS | Status: DC | PRN
  Administered 2016-12-22: 1 via TOPICAL
  Administered 2016-12-24: 02:00:00 via TOPICAL
  Filled 2016-12-22: qty 35.44

## 2016-12-22 MED ORDER — MELATONIN 3 MG PO TABS: 3.0000 mg | ORAL_TABLET | Freq: Every evening | ORAL | Status: DC | PRN

## 2016-12-22 NOTE — Evaluation (Signed)
Physical Therapy Evaluation Patient Details Name: [name] CombesBetty J [name] MRN: [id number] DOB: 10/22/1930 Today's Date: 12/22/2016   History of Present Illness  83 y.o. female admitted with acute pyelonephritis. history of significant of chronic back pain-s/p kyphoplasties, hypertension, hyperlipidemia, GERD, hypothyroidism, depression, slightly, anemia, GI bleeding, thrombocytopenia, vertigo, who presents with urinary frequency, burning on urination, nausea, vomiting and bilateral flank pain.    Clinical Impression  On eval, pt required Min assist for mobility. She walked ~75 feet with a RW. Chronic and acute back pain ~5/10 with activity. Once ambulating, pain was less per pt report. Discussed d/c plan-recommend ST rehab at SNF prior to returning to apartment, if pt is agreeable. Will follow.     Follow Up Recommendations SNF    Equipment Recommendations  None recommended by PT    Recommendations for Other Services       Precautions / Restrictions Precautions Precautions: Fall Restrictions Weight Bearing Restrictions: No      Mobility  Bed Mobility Overal bed mobility: Needs Assistance Bed Mobility: Rolling;Sidelying to Sit Rolling: Min assist Sidelying to sit: Min assist;HOB elevated      General bed mobility comments: Increased time. Pt performed log roll technique. Cues for hand placement.   Transfers Overall transfer level: Needs assistance Equipment used: Rolling walker (2 wheeled) Transfers: Sit to/from Stand Sit to Stand: From elevated surface;Min assist Stand pivot transfers: Min assist       General transfer comment: Assist to rise, stabilize, control descent. VCs safety, technique, hand placement. Stand pivot, bed to bsc, with RW  Ambulation/Gait Ambulation/Gait assistance: Min assist Ambulation Distance (Feet): 75 Feet Assistive device: Rolling walker (2 wheeled) Gait Pattern/deviations: Step-through pattern;Decreased stride length     General Gait Details:  Intermittent assist to steady. Slow gait speed. Pt tolerated distance well.   Stairs            Wheelchair Mobility    Modified Rankin (Stroke Patients Only)       Balance Overall balance assessment: History of Falls;Needs assistance Sitting-balance support: No upper extremity supported Sitting balance-Leahy Scale: Good     Standing balance support: Single extremity supported Standing balance-Leahy Scale: Poor                               Pertinent Vitals/Pain Pain Assessment: Faces Pain Score: 5  Faces Pain Scale: Hurts little more Pain Location: back (more with getting in /out of bed) Pain Descriptors / Indicators: Discomfort;Guarding;Grimacing Pain Intervention(s): Monitored during session;Repositioned    Home Living Family/patient expects to be discharged to:: Private residence Living Arrangements: Alone   Type of Home: Independent living facility Home Access: Level entry     Home Layout: One level Home Equipment: Environmental consultantWalker - 2 wheels Additional Comments: [facility] and would benefit from short term follow up in [facility] rehab setting    Prior Function Level of Independence: Independent with assistive device(s)         Comments: ambulatory with a RW     Hand Dominance        Extremity/Trunk Assessment   Upper Extremity Assessment Upper Extremity Assessment: Defer to OT evaluation    Lower Extremity Assessment Lower Extremity Assessment: Generalized weakness    Cervical / Trunk Assessment Cervical / Trunk Assessment: Kyphotic  Communication   Communication: No difficulties  Cognition Arousal/Alertness: Awake/alert Behavior During Therapy: WFL for tasks assessed/performed Overall Cognitive Status: Within Functional Limits for tasks assessed  General Comments      Exercises     Assessment/Plan    PT Assessment Patient needs continued PT  services  PT Problem List Decreased strength;Decreased mobility;Decreased activity tolerance;Decreased balance;Decreased knowledge of use of DME;Pain       PT Treatment Interventions DME instruction;Gait training;Therapeutic activities;Therapeutic exercise;Patient/family education;Balance training;Functional mobility training    PT Goals (Current goals can be found in the Care Plan section)  Acute Rehab PT Goals Patient Stated Goal: back to apartment PT Goal Formulation: With patient Time For Goal Achievement: 01/05/17 Potential to Achieve Goals: Good    Frequency Min 3X/week   Barriers to discharge Decreased caregiver support      Co-evaluation               AM-PAC PT "6 Clicks" Daily Activity  Outcome Measure Difficulty turning over in bed (including adjusting bedclothes, sheets and blankets)?: A Little Difficulty moving from lying on back to sitting on the side of the bed? : A Little Difficulty sitting down on and standing up from a chair with arms (e.g., wheelchair, bedside commode, etc,.)?: A Little Help needed moving to and from a bed to chair (including a wheelchair)?: A Little Help needed walking in hospital room?: A Little Help needed climbing 3-5 steps with a railing? : A Little 6 Click Score: 18    End of Session Equipment Utilized During Treatment: Gait belt Activity Tolerance: Patient tolerated treatment well Patient left: in chair;with call bell/phone within reach   PT Visit Diagnosis: Muscle weakness (generalized) (M62.81);Difficulty in walking, not elsewhere classified (R26.2)    Time: 1610-9604 PT Time Calculation (min) (ACUTE ONLY): 31 min   Charges:   PT Evaluation $PT Eval Low Complexity: 1 Procedure PT Treatments $Gait Training: 8-22 mins   PT G Codes:   PT G-Codes **NOT FOR INPATIENT CLASS** Functional Assessment Tool Used: AM-PAC 6 Clicks Basic Mobility;Clinical judgement Functional Limitation: Mobility: Walking and moving  around Mobility: Walking and Moving Around Current Status (V4098): At least 20 percent but less than 40 percent impaired, limited or restricted Mobility: Walking and Moving Around Goal Status [phone]): At least 1 percent but less than 20 percent impaired, limited or restricted    [name], MPT Pager: [phone]

## 2016-12-22 NOTE — Progress Notes (Signed)
Patient voided 350 mL of urine. Patient was retaining 945 mL of urine according to bladder scanner. MD notified.  Earnest ConroyBrooke M. Clelia CroftShaw, RN

## 2016-12-22 NOTE — ED Notes (Signed)
[facility] arrived to transport pt at this time.  

## 2016-12-22 NOTE — Care Management Obs Status (Signed)
MEDICARE OBSERVATION STATUS NOTIFICATION   Patient Details  Name: [name] [name] MRN: [id number] Date of Birth: 02/20/1931   Medicare Observation Status Notification Given:  Yes    MahabirOlegario Messier, Kasyn Stouffer, RN 12/22/2016, 3:25 PM

## 2016-12-22 NOTE — ED Provider Notes (Signed)
Pt requested her nightly Heide Guilenorvasc   [name] Cherubin, MD 12/22/16 0003

## 2016-12-22 NOTE — H&P (Addendum)
History and Physical    [name] [name] ZSM:[id number] DOB: 14-Oct-1930 DOA: 12/21/2016  Referring MD/NP/PA:   PCP: Burnard Bunting, MD   Patient coming from:  The patient is coming from home.  At baseline, pt is independent for most of ADL.      Chief Complaint: Urinary frequency, burning on urination, nausea, vomiting, bilateral flank pain  HPI: [name] [name] is a 83 y.o. female with medical history significant of chronic back pain, hypertension, hyperlipidemia, GERD, hypothyroidism, depression, slightly, anemia, GI bleeding, thrombocytopenia, vertigo, who presents with urinary frequency, burning on urination, nausea, vomiting and bilateral flank pain.  Patient states that her symptoms started yesterday. She has increased urinary frequency, mild burning on urination, but no dysuria. She also has nausea and vomited once without blood in the vomitus. She reports bilateral flank pain, which is constant, 9 out of 10 in severity, nonradiating. aggravated by movement. Patient denies chest pain, SOB, cough, fever or chills. No abdominal pain or diarrhea. She is constipated. Patient does not have unilateral weakness.  ED Course: pt was found to have positive urinalysis with large amount of leukocyte, WBC 7.7, pending lactic acid level, creatinine normal, temperature normal, no tachycardia, oxygen saturation 88% on room air, which improved to 92% on 1 L nasal cannula oxygen. CT-abd/pelvis findings are not impressive. Patient is placed on MedSurg bed for observation.  Review of Systems:   General: no fevers, chills, no changes in body weight, has poor appetite, has fatigue HEENT: no blurry vision, hearing changes or sore throat Respiratory: no dyspnea, coughing, wheezing CV: no chest pain, no palpitations GI: no nausea, vomiting, abdominal pain, diarrhea, constipation GU: no dysuria, has burning on urination, increased urinary frequency, no hematuria  Ext: no leg edema Neuro: no unilateral weakness,  numbness, or tingling, no vision change or hearing loss Skin: no rash, no skin tear. MSK: has back pain and flank pain Heme: No easy bruising.  Travel history: No recent long distant travel.  Allergy: No Known Allergies  Past Medical History:  Diagnosis Date  . Anemia   . Anxiety   . Chronotropic incompetence 12/2012    potentially medication related; noted on CPET   . DDD (degenerative disc disease)   . Eczema   . GERD (gastroesophageal reflux disease)   . H/O hiatal hernia   . Hyperlipidemia   . Hypertension   . Hypothyroidism   . Osteopenia   . Septicemia ([place]) 2002   following UTI  . Vertigo, benign positional     Past Surgical History:  Procedure Laterality Date  . BREAST SURGERY     left biopsy  . CPET / MET - PFTS     Consistent with chronotropic incompetence; See attached report in the results section  . DILATION AND CURETTAGE OF UTERUS     x 2  . DOPPLER ECHOCARDIOGRAPHY  01/10/2013   Normal LV size and function. Normal EF. Air sclerosis but no stenosis  . ESOPHAGOGASTRODUODENOSCOPY  05/04/2012   Procedure: ESOPHAGOGASTRODUODENOSCOPY (EGD);  Surgeon: Inda Castle, MD;  Location: [facility];  Service: Endoscopy;  Laterality: N/A;  . EYE SURGERY     cataract extraction with ILO  ? eye  . IR KYPHO EA ADDL LEVEL THORACIC OR LUMBAR  10/28/2016  . IR KYPHO LUMBAR INC FX REDUCE BONE BX UNI/BIL CANNULATION INC/IMAGING  10/28/2016  . IR RADIOLOGIST EVAL & MGMT  11/11/2016  . KNEE ARTHROSCOPY  2011   right  . TONSILLECTOMY    . TOTAL KNEE ARTHROPLASTY  04/24/2012   Procedure: TOTAL KNEE ARTHROPLASTY;  Surgeon: Gearlean Alf, MD;  Location: [facility];  Service: Orthopedics;  Laterality: Right;    Social History:  reports that she quit smoking about 51 years ago. Her smoking use included Cigarettes. She quit after 0.00 years of use. She has never used smokeless tobacco. She reports that she drinks alcohol. She reports that she does not use drugs.  Family History:    Family History  Problem Relation Age of Onset  . Lung cancer Father 22       Died at age 80  . Stroke Mother 56       Died in her late 55s.  Marland Kitchen Heart attack Mother 74     Prior to Admission medications   Medication Sig Start Date End Date Taking? Authorizing Provider  Abaloparatide (TYMLOS [place]) Inject into the skin.   Yes [provider]  DULoxetine HCl (CYMBALTA PO) Take by mouth.   Yes [provider]  acetaminophen (TYLENOL) 325 MG tablet Take 650 mg by mouth every 6 (six) hours as needed for mild pain.     [provider]  ALPRAZolam Duanne Moron) 0.25 MG tablet Take 0.25 mg by mouth 2 (two) times daily as needed for anxiety.  08/16/16   [provider]  amLODipine (NORVASC) 10 MG tablet Take 1 tablet (10 mg total) by mouth daily. 05/08/12   Rai, Vernelle Emerald, MD  aspirin EC 81 MG tablet Take 81 mg by mouth daily.    [provider]  calcium carbonate (OS-CAL) 600 MG TABS Take 600 mg by mouth 2 (two) times daily with a meal.    [provider]  cholecalciferol (VITAMIN D) 1000 UNITS tablet Take 1,000 Units by mouth daily.    [provider]  diazepam (VALIUM) 5 MG tablet Take 1 tablet (5 mg total) by mouth every 8 (eight) hours as needed for anxiety or muscle spasms. 10/24/16   Virgel Manifold, MD  fish oil-omega-3 fatty acids 1000 MG capsule Take 1 g by mouth daily.    [provider]  furosemide (LASIX) 20 MG tablet Take 20 mg by mouth daily as needed for fluid or edema.    [provider]  HYDROcodone-acetaminophen (NORCO/VICODIN) 5-325 MG tablet Take 1 tablet by mouth every 6 (six) hours as needed for moderate pain. 10/22/16   Lavina Hamman, MD  levothyroxine (SYNTHROID, LEVOTHROID) 50 MCG tablet Take 50 mcg by mouth daily before breakfast.    [provider]  losartan (COZAAR) 100 MG tablet TAKE 1 TABLET BY MOUTH  DAILY 05/27/16   Leonie Man, MD  methocarbamol (ROBAXIN) 500 MG tablet Take 1 tablet  (500 mg total) by mouth every 8 (eight) hours as needed for muscle spasms. 10/22/16   Lavina Hamman, MD  Multiple Vitamin (MULTIVITAMIN WITH MINERALS) TABS tablet Take 1 tablet by mouth daily.    [provider]  omeprazole (PRILOSEC) 40 MG capsule Take 40 mg by mouth daily.    [provider]  polyethylene glycol (MIRALAX / GLYCOLAX) packet Take 17 g by mouth daily. 10/23/16   Lavina Hamman, MD  traMADol (ULTRAM) 50 MG tablet Take 50 mg by mouth every 8 (eight) hours as needed for moderate pain or severe pain.    [provider]    Physical Exam: Vitals:   12/21/16 2330 12/22/16 0000 12/22/16 0055 12/22/16 0207  BP: (!) 166/67 (!) 147/52 (!) 163/58 (!) 170/49  Pulse: 70 61 68 (!) 56  Resp: '16  14 15  ' Temp:    97.9 F (36.6 C)  TempSrc:    Oral  SpO2: 97% 93% 98% 90%  Weight:    68.2 kg (150 lb 5.7 oz)  Height:    '5\' 5"'  (1.651 m)   General: Not in acute distress HEENT:       Eyes: PERRL, EOMI, no scleral icterus.       ENT: No discharge from the ears and nose, no pharynx injection, no tonsillar enlargement.        Neck: No JVD, no bruit, no mass felt. Heme: No neck lymph node enlargement. Cardiac: T4/S5, RRR, 1/6 systolic murmurs, No gallops or rubs. Respiratory: No rales, wheezing, rhonchi or rubs. GI: Soft, nondistended, nontender, no rebound pain, no organomegaly, BS present. GU: No hematuria Ext: No pitting leg edema bilaterally. 2+DP/PT pulse bilaterally. Musculoskeletal: has bilateral flank CVA tenderness Skin: No rashes.  Neuro: Alert, oriented X3, cranial nerves II-XII grossly intact, moves all extremities normally.  Psych: Patient is not psychotic, no suicidal or hemocidal ideation.  Labs on Admission: I have personally reviewed following labs and imaging studies  CBC:  Recent Labs Lab 12/21/16 2022  WBC 7.7  HGB 11.4*  HCT 33.1*  MCV 84.7  PLT 94*   Basic Metabolic Panel:  Recent Labs Lab 12/21/16 2022  NA 124*  K 5.0  CL  90*  CO2 25  GLUCOSE 112*  BUN 15  CREATININE 0.65  CALCIUM 9.9   GFR: Estimated Creatinine Clearance: 47.1 mL/min (by C-G formula based on SCr of 0.65 mg/dL). Liver Function Tests:  Recent Labs Lab 12/21/16 2022  AST 37  ALT 23  ALKPHOS 103  BILITOT 0.5  PROT 7.7  ALBUMIN 4.6   No results for input(s): LIPASE, AMYLASE in the last 168 hours. No results for input(s): AMMONIA in the last 168 hours. Coagulation Profile: No results for input(s): INR, PROTIME in the last 168 hours. Cardiac Enzymes: No results for input(s): CKTOTAL, CKMB, CKMBINDEX, TROPONINI in the last 168 hours. BNP (last 3 results) No results for input(s): PROBNP in the last 8760 hours. HbA1C: No results for input(s): HGBA1C in the last 72 hours. CBG: No results for input(s): GLUCAP in the last 168 hours. Lipid Profile: No results for input(s): CHOL, HDL, LDLCALC, TRIG, CHOLHDL, LDLDIRECT in the last 72 hours. Thyroid Function Tests: No results for input(s): TSH, T4TOTAL, FREET4, T3FREE, THYROIDAB in the last 72 hours. Anemia Panel: No results for input(s): VITAMINB12, FOLATE, FERRITIN, TIBC, IRON, RETICCTPCT in the last 72 hours. Urine analysis:    Component Value Date/Time   COLORURINE YELLOW 12/21/2016 2105   APPEARANCEUR CLOUDY (A) 12/21/2016 2105   LABSPEC 1.007 12/21/2016 2105   PHURINE 7.0 12/21/2016 2105   GLUCOSEU NEGATIVE 12/21/2016 2105   HGBUR SMALL (A) 12/21/2016 2105   BILIRUBINUR NEGATIVE 12/21/2016 2105   KETONESUR NEGATIVE 12/21/2016 2105   PROTEINUR NEGATIVE 12/21/2016 2105   UROBILINOGEN 0.2 03/24/2015 1527   NITRITE NEGATIVE 12/21/2016 2105   LEUKOCYTESUR LARGE (A) 12/21/2016 2105   Sepsis Labs: '@LABRCNTIP' (procalcitonin:4,lacticidven:4) )No results found for this or any previous visit (from the past 240 hour(s)).   Radiological Exams on Admission: Ct Abdomen Pelvis W Contrast  Result Date: 12/21/2016 CLINICAL DATA:  Bilateral flank pain starting last night with vomiting  x1. EXAM: CT ABDOMEN AND PELVIS WITH CONTRAST TECHNIQUE: Multidetector CT imaging of the abdomen and pelvis was performed using the standard protocol following bolus administration of intravenous contrast. CONTRAST:  155m ISOVUE-300 IOPAMIDOL (ISOVUE-300) INJECTION  61% COMPARISON:  05/26/2010 CT FINDINGS: Lower chest: Normal visualized cardiac chambers. No pericardial effusion. Coronary arteriosclerosis is noted the circumflex. Hepatobiliary: Homogeneous attenuation of the liver without biliary dilatation. Moderate gallbladder distention with portions of the gallbladder interposed between the liver and ventral abdominal wall. No wall thickening or pericholecystic fluid. No gallstones are noted. Pancreas: No focal pancreatic mass. Mild ductal ectasia to 4 mm. No pancreatic inflammation. Spleen: No splenic mass or enlargement. Adrenals/Urinary Tract: Normal bilateral adrenal glands and kidneys. Extrarenal pelvis on the left. Distended urinary bladder without calculus or mass. Stomach/Bowel: Contrast distention of the stomach. Normal small bowel rotation. Increased colonic stool burden consistent with constipation. No bowel obstruction or inflammation. Normal appendix. Vascular/Lymphatic: Aortoiliac atherosclerosis without aneurysmal dilatation. The abdominal aorta is ectatic in appearance. Reproductive: Normal uterus.  No adnexal mass. Other: No abdominal wall hernia or abnormality. No abdominopelvic ascites. Musculoskeletal: L1 and L2 vertebral augmentation for compression fractures. Dextroscoliosis the lumbar spine. No acute nor suspicious osseous abnormalities. IMPRESSION: 1. Coronary arteriosclerosis and aortic atherosclerosis. 2. Moderate gallbladder distention without secondary signs of acute cholecystitis. There is interposition of a portion of the gallbladder between the liver and ventral abdominal wall which might contribute pain with compression in the right upper quadrant. 3. No nephrolithiasis, renal mass  or obstructive uropathy, however the urinary bladder is moderately distended without source of obstruction noted. If the patient is unable to void, Foley decompression may prove useful. 4. Moderate colonic stool burden consistent with constipation. No bowel inflammation. 5. L1 and L2 vertebral body augmentation secondary to compression fractures. Dextroscoliosis of the lumbar spine. Electronically Signed   By: Ashley Royalty M.D.   On: 12/21/2016 22:28     EKG: Not done in ED, will get one.   Assessment/Plan Principal Problem:   Acute pyelonephritis Active Problems:   Essential hypertension   Hyponatremia   Thrombocytopenia (HCC)   GERD (gastroesophageal reflux disease)   Depression with anxiety   Acute pyelonephritis: Patient has increased urinary frequency and burning on urination, positive urinalysis, bilateral flank pain, consistent with pyelonephritis. Patient does not have fever or leukocytosis. Clinically nonseptic. Currently hemodynamically stable  -  Place on med-surg bed for obs -  Ceftriaxone by IV - Follow up results of urine and blood cx and amend antibiotic regimen if needed per sensitivity results - prn Zofran for nausea - prn percocet for pain  Hyponatremia: Na 124. Mental status normal. Most likely due to nausea, vomiting and decreased oral intake -IVF: 100 cc/ h of NS -Follow-up with edema  Essential hypertension: -Continue amlodipine and losartan -Hold Lasix due to hyponatremia -IV hydralazine when necessary  GERD: -Protonix  Depression and anxiety: no suicidal or homicidal ideations. -Continue home medications: Cymbalta, when necessary Xanax  Chronic Thrombocytopenia ([place]): platelet 94. No bleeding tendency. -Follow-up by CBC   DVT ppx: SCD Code Status: Full code Family Communication: None at bed side.  Disposition Plan:  Anticipate discharge back to previous home environment Consults called:  none Admission status:  medical floor/obs    Date of  Service 12/22/2016    Ivor Costa [facility] Pager [phone]  If 7PM-7AM, please contact night-coverage [email or web address] Password [email or web address] 12/22/2016, 2:30 AM

## 2016-12-22 NOTE — Progress Notes (Signed)
Initial Nutrition Assessment  DOCUMENTATION CODES:   Not applicable  INTERVENTION:  - Continue to encourage PO intakes. - Provided explanation/education on the menu. - Will change Ensure Enlive to once/day PRN, this supplement provides 350 kcal and 20 grams of protein.   NUTRITION DIAGNOSIS:   Inadequate oral intake related to  (recent addition of medication) as evidenced by per patient/family report.  GOAL:   Patient will meet greater than or equal to 90% of their needs  MONITOR:   PO intake, Weight trends, Labs  REASON FOR ASSESSMENT:   Malnutrition Screening Tool  ASSESSMENT:   83 y.o. female with medical history significant of chronic back pain, hypertension, hyperlipidemia, GERD, hypothyroidism, depression, slightly, anemia, GI bleeding, thrombocytopenia, vertigo, who presents with urinary frequency, burning on urination, nausea, vomiting and bilateral flank pain.  Pt seen for MST. BMI indicates overweight status, appropriate for age. No intakes documented since admission but pt reports she was able to eat 100% of breakfast this AM. Notes had indicated N/V PTA but pt reports that this only occurred x1 PTA; she was tired and stressed after showering and when she stepped out of the shower she felt nauseated and vomited "a very small amount, just that one time." She states that prior to receiving breakfast tray today she felt nauseated again and that RN gave her IV Zofran which instantly resolved nausea.   Daughter, who is at bedside, reports that pt was started on Cymbalta ~2 weeks ago and that shortly after starting medication pt began experiencing decreased appetite and a few pound weight loss. As time goes on these side effects have been resolving and appetite and weight are now close to baseline. Daughter states that pt does not use salt to season foods at home.   Physical assessment shows no muscle or fat wasting at this time. Current weight consistent with  UBW.  Medications reviewed; 1 tablet Oscal BID, 1000 units vitamin D/day, 50 mcg oral Synthroid/day, daily multivitamin with minerals, 40 mg oral Protonix/day, 17 g Miralax/day, 1000 mcg oral vitamin B12/day.  Labs reviewed; Na: 127 mmol/L, Cl: 93 mmol/L.   IVF: NS @ 100 mL/hr.    Diet Order:  Diet Heart Room service appropriate? Yes; Fluid consistency: Thin  Skin:  Reviewed, no issues  Last BM:  5/29  Height:   Ht Readings from Last 1 Encounters:  12/22/16 5\' 5"  (1.651 m)    Weight:   Wt Readings from Last 1 Encounters:  12/22/16 150 lb 5.7 oz (68.2 kg)    Ideal Body Weight:  56.82 kg  BMI:  Body mass index is 25.02 kg/m.  Estimated Nutritional Needs:   Kcal:  1500-1705 (22-25 kcal/kg)  Protein:  55-68 grams (0.8-1 grams/kg)  Fluid:  >/= 1.7 L/day  EDUCATION NEEDS:   No education needs identified at this time    Trenton GammonJessica Damariz Paganelli, MS, RD, LDN, CNSC Inpatient Clinical Dietitian Pager # [phone] After hours/weekend pager # [phone]

## 2016-12-22 NOTE — Progress Notes (Signed)
PROGRESS NOTE  [name] [name] QIH:[id number] DOB: [birth year] DOA: 12/21/2016 PCP: Geoffry Paradise, MD  HPI/Recap of past 24 hours:  Frail elderly female laying in bed, c/o back pain and side pain Has female urinary catheter on wall suction with clear urine in canister No edema  Assessment/Plan: Principal Problem:   Acute pyelonephritis Active Problems:   Essential hypertension   Hyponatremia   Thrombocytopenia (HCC)   GERD (gastroesophageal reflux disease)   Depression with anxiety   Acute respiratory failure with hypoxia (HCC)  UTI/urinary retention+/-Acute pyelonephritis:  Ct ab /pel "Tract: Normal bilateral adrenal glands and kidneys. Extrarenal pelvis on the left. Distended urinary bladder without calculus or mass. " ua + bacteria, wbc TNTC, rbc TNTC, large leukocytes,  Post void residual 945, though patient denies feeling full, insert foley Patient does not have fever or leukocytosis. Currently hemodynamically stable Blood culture was not done,wil order blood culture, urine culture in process, continue rocephin for now urinary retention likely from recent opioids anlagesics for back pain   Hyponatremia: Na 124. Mental status normal. Most likely due to nausea, vomiting and decreased oral intake -IVF: 100 cc/ h of NS -urine sodium/urine osmo pending  Back pain: and bilateral side pain, s/p recent kyphoplasty in 10/2016 Continue to c.o back pain, prn pain meds, PT,  IR consulted , mri back?  Essential hypertension: -Continue amlodipine and losartan -Hold Lasix due to hyponatremia -IV hydralazine when necessary  GERD: -Protonix  Depression and anxiety: no suicidal or homicidal ideations. -Continue home medications: Cymbalta, when necessary Xanax  Chronic Thrombocytopenia (HCC): platelet 94. No bleeding tendency. -Follow-up by CBC  constipation: stool softener  DVT ppx: SCD Code Status: Full code Family Communication: None at bed side.  Disposition  Plan:  SNF Consults called:  IR  Procedures:  none  Antibiotics:  rocephin   Objective: BP (!) 160/53 (BP Location: Right Arm)   Pulse (!) 59   Temp 97.8 F (36.6 C) (Oral)   Resp 18   Ht 5\' 5"  (1.651 m)   Wt 68.2 kg (150 lb 5.7 oz)   SpO2 100%   BMI 25.02 kg/m   Intake/Output Summary (Last 24 hours) at 12/22/16 0808 Last data filed at 12/22/16 0700  Gross per 24 hour  Intake           236.67 ml  Output              300 ml  Net           -63.33 ml   Filed Weights   12/21/16 1923 12/22/16 0207  Weight: 67.1 kg (148 lb) 68.2 kg (150 lb 5.7 oz)    Exam:   General:  Frail, chronically ill   Cardiovascular: RRR  Respiratory: CTABL  Abdomen: Soft/ND/NT, positive BS  Musculoskeletal: No Edema  Neuro: aaox3  Data Reviewed: Basic Metabolic Panel:  Recent Labs Lab 12/21/16 2022 12/22/16 0604  NA 124* 127*  K 5.0 3.9  CL 90* 93*  CO2 25 26  GLUCOSE 112* 101*  BUN 15 11  CREATININE 0.65 0.46  CALCIUM 9.9 9.4   Liver Function Tests:  Recent Labs Lab 12/21/16 2022  AST 37  ALT 23  ALKPHOS 103  BILITOT 0.5  PROT 7.7  ALBUMIN 4.6   No results for input(s): LIPASE, AMYLASE in the last 168 hours. No results for input(s): AMMONIA in the last 168 hours. CBC:  Recent Labs Lab 12/21/16 2022 12/22/16 0604  WBC 7.7 5.9  HGB 11.4* 11.4*  HCT 33.1*  33.4*  MCV 84.7 85.0  PLT 94* 85*   Cardiac Enzymes:   No results for input(s): CKTOTAL, CKMB, CKMBINDEX, TROPONINI in the last 168 hours. BNP (last 3 results) No results for input(s): BNP in the last 8760 hours.  ProBNP (last 3 results) No results for input(s): PROBNP in the last 8760 hours.  CBG: No results for input(s): GLUCAP in the last 168 hours.  Recent Results (from the past 240 hour(s))  MRSA PCR Screening     Status: None   Collection Time: 12/22/16  3:25 AM  Result Value Ref Range Status   MRSA by PCR NEGATIVE NEGATIVE Final    Comment:        The GeneXpert MRSA Assay  (FDA approved for NASAL specimens only), is one component of a comprehensive MRSA colonization surveillance program. It is not intended to diagnose MRSA infection nor to guide or monitor treatment for MRSA infections.      Studies: Ct Abdomen Pelvis W Contrast  Result Date: 12/21/2016 CLINICAL DATA:  Bilateral flank pain starting last night with vomiting x1. EXAM: CT ABDOMEN AND PELVIS WITH CONTRAST TECHNIQUE: Multidetector CT imaging of the abdomen and pelvis was performed using the standard protocol following bolus administration of intravenous contrast. CONTRAST:  100mL ISOVUE-300 IOPAMIDOL (ISOVUE-300) INJECTION 61% COMPARISON:  05/26/2010 CT FINDINGS: Lower chest: Normal visualized cardiac chambers. No pericardial effusion. Coronary arteriosclerosis is noted the circumflex. Hepatobiliary: Homogeneous attenuation of the liver without biliary dilatation. Moderate gallbladder distention with portions of the gallbladder interposed between the liver and ventral abdominal wall. No wall thickening or pericholecystic fluid. No gallstones are noted. Pancreas: No focal pancreatic mass. Mild ductal ectasia to 4 mm. No pancreatic inflammation. Spleen: No splenic mass or enlargement. Adrenals/Urinary Tract: Normal bilateral adrenal glands and kidneys. Extrarenal pelvis on the left. Distended urinary bladder without calculus or mass. Stomach/Bowel: Contrast distention of the stomach. Normal small bowel rotation. Increased colonic stool burden consistent with constipation. No bowel obstruction or inflammation. Normal appendix. Vascular/Lymphatic: Aortoiliac atherosclerosis without aneurysmal dilatation. The abdominal aorta is ectatic in appearance. Reproductive: Normal uterus.  No adnexal mass. Other: No abdominal wall hernia or abnormality. No abdominopelvic ascites. Musculoskeletal: L1 and L2 vertebral augmentation for compression fractures. Dextroscoliosis the lumbar spine. No acute nor suspicious osseous  abnormalities. IMPRESSION: 1. Coronary arteriosclerosis and aortic atherosclerosis. 2. Moderate gallbladder distention without secondary signs of acute cholecystitis. There is interposition of a portion of the gallbladder between the liver and ventral abdominal wall which might contribute pain with compression in the right upper quadrant. 3. No nephrolithiasis, renal mass or obstructive uropathy, however the urinary bladder is moderately distended without source of obstruction noted. If the patient is unable to void, Foley decompression may prove useful. 4. Moderate colonic stool burden consistent with constipation. No bowel inflammation. 5. L1 and L2 vertebral body augmentation secondary to compression fractures. Dextroscoliosis of the lumbar spine. Electronically Signed   By: Tollie Ethavid  Kwon M.D.   On: 12/21/2016 22:28    Scheduled Meds: . Abaloparatide  80 mcg Injection Daily  . amLODipine  10 mg Oral Daily  . aspirin EC  81 mg Oral Daily  . calcium carbonate  1,250 mg Oral BID WC  . cholecalciferol  1,000 Units Oral Daily  . DULoxetine  20 mg Oral Daily  . feeding supplement (ENSURE ENLIVE)  237 mL Oral BID BM  . levothyroxine  50 mcg Oral QAC breakfast  . losartan  100 mg Oral Daily  . multivitamin with minerals  1 tablet Oral  Daily  . omega-3 acid ethyl esters  1 g Oral Daily  . ondansetron      . pantoprazole  40 mg Oral Daily  . polyethylene glycol  17 g Oral Daily  . vitamin B-12  1,000 mcg Oral Daily    Continuous Infusions: . sodium chloride 100 mL/hr at 12/22/16 0008  . cefTRIAXone (ROCEPHIN)  IV       Time spent: >81mins from 8am to 8:35am  Kevonte Vanecek MD, PhD  [facility] Pager [phone]. If 7PM-7AM, please contact night-coverage at [email or web address], password [facility] 12/22/2016, 8:08 AM  LOS: 0 days

## 2016-12-22 NOTE — Evaluation (Signed)
Occupational Therapy Evaluation Patient Details Name: [name] [name] MRN: [id number] DOB: Feb 03, 1931 Today's Date: 12/22/2016    History of Present Illness [name] [name] is a 83 y.o. female with medical history significant of chronic back pain, hypertension, hyperlipidemia, GERD, hypothyroidism, depression, slightly, anemia, GI bleeding, thrombocytopenia, vertigo, who presents with urinary frequency, burning on urination, nausea, vomiting and bilateral flank pain.   Clinical Impression   Pt admitted with pyelonephritis . Pt currently with functional limitations due to the deficits listed below (see OT Problem List).  Pt will benefit from skilled OT to increase their safety and independence with ADL and functional mobility for ADL to facilitate discharge to venue listed below.      Follow Up Recommendations  SNF  ( Rehab at [facility]  Prior to Dc back to her apartment)         Precautions / Restrictions Precautions Precautions: Fall Restrictions Weight Bearing Restrictions: No      Mobility Bed Mobility Overal bed mobility: Needs Assistance Bed Mobility: Sit to Supine       Sit to supine: Mod assist   General bed mobility comments: VC for log roll and technique to reposition in the bed  Transfers Overall transfer level: Needs assistance Equipment used: 1 person hand held assist Transfers: Sit to/from [employer] Transfers Sit to Stand: Mod assist;From elevated surface Stand pivot transfers: Mod assist            Balance Overall balance assessment: History of Falls;Needs assistance Sitting-balance support: No upper extremity supported Sitting balance-Leahy Scale: Good     Standing balance support: Single extremity supported Standing balance-Leahy Scale: Poor                             ADL either performed or assessed with clinical judgement   ADL Overall ADL's : Needs assistance/impaired                         Toilet  Transfer: Moderate assistance;BSC;Stand-pivot;Cueing for safety;Cueing for sequencing   Toileting- Clothing Manipulation and Hygiene: Maximal assistance;Sit to/from stand;Cueing for safety;Cueing for sequencing               Vision Patient Visual Report: No change from baseline       Perception     Praxis      Pertinent Vitals/Pain Pain Assessment: 0-10 Pain Score: 5  Pain Location: back Pain Descriptors / Indicators: Discomfort;Dull;Grimacing;Guarding Pain Intervention(s): Limited activity within patient's tolerance;Repositioned;Patient requesting pain meds-RN notified;Monitored during session     Hand Dominance     Extremity/Trunk Assessment Upper Extremity Assessment Upper Extremity Assessment: Generalized weakness           Communication Communication Communication: No difficulties   Cognition Arousal/Alertness: Awake/alert Behavior During Therapy: WFL for tasks assessed/performed Overall Cognitive Status: Within Functional Limits for tasks assessed                                     General Comments                  Prior Functioning/Environment Level of Independence: Independent with assistive device(s)        Comments: Pt used cane or RW as needed        OT Problem List: Decreased strength;Decreased activity tolerance;Pain;Decreased knowledge of use of DME or AE;Impaired balance (  sitting and/or standing)      OT Treatment/Interventions: Self-care/ADL training;DME and/or AE instruction;Patient/family education;Therapeutic activities    OT Goals(Current goals can be found in the care plan section) Acute Rehab OT Goals Patient Stated Goal: back to apartment OT Goal Formulation: With patient Time For Goal Achievement: 12/22/16 Potential to Achieve Goals: Good  OT Frequency: Min 2X/week   Barriers to D/C:               AM-PAC PT "6 Clicks" Daily Activity     Outcome Measure Help from another person eating meals?:  None Help from another person taking care of personal grooming?: A Little Help from another person toileting, which includes using toliet, bedpan, or urinal?: A Lot Help from another person bathing (including washing, rinsing, drying)?: A Lot Help from another person to put on and taking off regular upper body clothing?: A Little Help from another person to put on and taking off regular lower body clothing?: A Lot 6 Click Score: 16   End of Session Equipment Utilized During Treatment: Rolling walker Nurse Communication: Mobility status  Activity Tolerance: Patient tolerated treatment well Patient left: in bed;with call bell/phone within reach;with bed alarm set;with nursing/sitter in room;with family/visitor present  OT Visit Diagnosis: Unsteadiness on feet (R26.81);Muscle weakness (generalized) (M62.81);Pain;History of falling (Z91.81) Pain - part of body:  (back)                Time: [id number] OT Time Calculation (min): 28 min Charges:  OT General Charges $OT Visit: 1 Procedure OT Treatments $Self Care/Home Management : 23-37 mins G-Codes:     Lise AuerLori Janathan Bribiesca, OT [phone]  Einar CrowEDDING, Miosha Behe D 12/22/2016, 10:18 AM

## 2016-12-22 NOTE — Care Management Note (Signed)
Case Management Note  Patient Details  Name: Jacqualin CombesBetty J Glinski MRN: [id number] Date of Birth: 11/20/1930  Subjective/Objective: 83 y/o f admitted w/Acute pyelonephritis. From indep liv-[facility]. PT recc SNF. CSW following.                   Action/Plan:d/c SNF.   Expected Discharge Date:                  Expected Discharge Plan:  Skilled Nursing Facility  In-House Referral:  Clinical Social Work  Discharge planning Services  CM Consult  Post Acute Care Choice:    Choice offered to:     DME Arranged:    DME Agency:     HH Arranged:    HH Agency:     Status of Service:  In process, will continue to follow  If discussed at Long Length of Stay Meetings, dates discussed:    Additional Comments:  Lanier ClamMahabir, Dorie Ohms, RN 12/22/2016, 3:26 PM

## 2016-12-22 NOTE — Progress Notes (Signed)
PHARMACIST - PHYSICIAN ORDER COMMUNICATION  CONCERNING: P&T Medication Policy on Herbal Medications  DESCRIPTION:  This patient's order for:  melatonin  has been noted.  This product(s) is classified as an "herbal" or natural product. Due to a lack of definitive safety studies or FDA approval, nonstandard manufacturing practices, plus the potential risk of unknown drug-drug interactions while on inpatient medications, the Pharmacy and Therapeutics Committee does not permit the use of "herbal" or natural products of this type within [facility].   ACTION TAKEN: The pharmacy department is unable to verify this order at this time ans has been discontinued. Please reevaluate patient's clinical condition at discharge and address if the herbal or natural product(s) should be resumed at that time.  Adalberto ColeNikola Raoul Ciano, PharmD, BCPS Pager [phone] 12/22/2016 6:54 AM

## 2016-12-23 ENCOUNTER — Observation Stay (HOSPITAL_COMMUNITY): Payer: Medicare Other

## 2016-12-23 DIAGNOSIS — E871 Hypo-osmolality and hyponatremia: Secondary | ICD-10-CM | POA: Diagnosis present

## 2016-12-23 DIAGNOSIS — Z7982 Long term (current) use of aspirin: Secondary | ICD-10-CM | POA: Diagnosis not present

## 2016-12-23 DIAGNOSIS — D696 Thrombocytopenia, unspecified: Secondary | ICD-10-CM | POA: Diagnosis present

## 2016-12-23 DIAGNOSIS — R330 Drug induced retention of urine: Secondary | ICD-10-CM | POA: Diagnosis present

## 2016-12-23 DIAGNOSIS — N10 Acute tubulo-interstitial nephritis: Secondary | ICD-10-CM | POA: Diagnosis present

## 2016-12-23 DIAGNOSIS — T402X5A Adverse effect of other opioids, initial encounter: Secondary | ICD-10-CM | POA: Diagnosis present

## 2016-12-23 DIAGNOSIS — F419 Anxiety disorder, unspecified: Secondary | ICD-10-CM | POA: Diagnosis present

## 2016-12-23 DIAGNOSIS — B954 Other streptococcus as the cause of diseases classified elsewhere: Secondary | ICD-10-CM | POA: Diagnosis present

## 2016-12-23 DIAGNOSIS — Y92019 Unspecified place in single-family (private) house as the place of occurrence of the external cause: Secondary | ICD-10-CM | POA: Diagnosis not present

## 2016-12-23 DIAGNOSIS — K5900 Constipation, unspecified: Secondary | ICD-10-CM | POA: Diagnosis present

## 2016-12-23 DIAGNOSIS — F329 Major depressive disorder, single episode, unspecified: Secondary | ICD-10-CM | POA: Diagnosis present

## 2016-12-23 DIAGNOSIS — E039 Hypothyroidism, unspecified: Secondary | ICD-10-CM | POA: Diagnosis present

## 2016-12-23 DIAGNOSIS — M4854XA Collapsed vertebra, not elsewhere classified, thoracic region, initial encounter for fracture: Secondary | ICD-10-CM | POA: Diagnosis present

## 2016-12-23 DIAGNOSIS — Z87891 Personal history of nicotine dependence: Secondary | ICD-10-CM | POA: Diagnosis not present

## 2016-12-23 DIAGNOSIS — Z801 Family history of malignant neoplasm of trachea, bronchus and lung: Secondary | ICD-10-CM | POA: Diagnosis not present

## 2016-12-23 DIAGNOSIS — I10 Essential (primary) hypertension: Secondary | ICD-10-CM | POA: Diagnosis present

## 2016-12-23 DIAGNOSIS — E785 Hyperlipidemia, unspecified: Secondary | ICD-10-CM | POA: Diagnosis present

## 2016-12-23 LAB — CBC WITH DIFFERENTIAL/PLATELET
BASOS ABS: 0 10*3/uL (ref 0.0–0.1)
BASOS PCT: 0 %
Eosinophils Absolute: 0.1 10*3/uL (ref 0.0–0.7)
Eosinophils Relative: 1 %
HEMATOCRIT: 36.4 % (ref 36.0–46.0)
HEMOGLOBIN: 12.2 g/dL (ref 12.0–15.0)
Lymphocytes Relative: 24 %
Lymphs Abs: 1.5 10*3/uL (ref 0.7–4.0)
MCH: 28.6 pg (ref 26.0–34.0)
MCHC: 33.5 g/dL (ref 30.0–36.0)
MCV: 85.2 fL (ref 78.0–100.0)
MONOS PCT: 17 %
Monocytes Absolute: 1 10*3/uL (ref 0.1–1.0)
NEUTROS ABS: 3.6 10*3/uL (ref 1.7–7.7)
NEUTROS PCT: 58 %
Platelets: 96 10*3/uL — ABNORMAL LOW (ref 150–400)
RBC: 4.27 MIL/uL (ref 3.87–5.11)
RDW: 17.4 % — ABNORMAL HIGH (ref 11.5–15.5)
WBC: 6.2 10*3/uL (ref 4.0–10.5)

## 2016-12-23 LAB — BASIC METABOLIC PANEL
ANION GAP: 9 (ref 5–15)
BUN: 15 mg/dL (ref 6–20)
CALCIUM: 9.6 mg/dL (ref 8.9–10.3)
CHLORIDE: 97 mmol/L — AB (ref 101–111)
CO2: 26 mmol/L (ref 22–32)
Creatinine, Ser: 0.55 mg/dL (ref 0.44–1.00)
GFR calc non Af Amer: >60 mL/min (ref >60)
Glucose, Bld: 106 mg/dL — ABNORMAL HIGH (ref 65–99)
POTASSIUM: 4.5 mmol/L (ref 3.5–5.1)
Sodium: 132 mmol/L — ABNORMAL LOW (ref 135–145)

## 2016-12-23 LAB — URIC ACID: URIC ACID, SERUM: 2.9 mg/dL (ref 2.3–6.6)

## 2016-12-23 LAB — TSH: TSH: 5.071 u[IU]/mL — AB (ref 0.350–4.500)

## 2016-12-23 LAB — MAGNESIUM: Magnesium: 2 mg/dL (ref 1.7–2.4)

## 2016-12-23 LAB — OSMOLALITY: OSMOLALITY: 274 mosm/kg — AB (ref 275–295)

## 2016-12-23 MED ORDER — BISACODYL 10 MG RE SUPP
10.0000 mg | Freq: Every day | RECTAL | Status: DC
  Administered 2016-12-23: 10 mg via RECTAL
  Filled 2016-12-23 (×2): qty 1

## 2016-12-23 MED ORDER — LIDOCAINE 5 % EX PTCH
1.0000 | MEDICATED_PATCH | CUTANEOUS | Status: AC
  Administered 2016-12-23 – 2016-12-25 (×3): 1 via TRANSDERMAL
  Filled 2016-12-23 (×3): qty 1

## 2016-12-23 NOTE — Consult Note (Signed)
Chief Complaint: Patient was seen in consultation today for back pain  Referring Physician(s):  Dr. Florencia Reasons  Supervising Physician: Luanne Bras  Patient Status: [facility] - In-pt  History of Present Illness: [name] [name] is a 83 y.o. female with past medical history of anemia, anxiety, GERD, HLD, HTN, and DDD s/p compression fractures at T9, L1, L2 treated with kyphoplasty  Patient presented to [facility] from [place] with side pain.  Patient found to have UTI with pyelonephritis.  Since admission, patient has also complained of ongoing back pain.  She has undergone kyphoplasty of her compression fractures in the past:  vertebroplasty T9 06/2015,  kyphoplasty L1 and L2 10/28/16.  Patient reports she did have significant improvement after first vertebroplasty at T9, but only 40% pain reduction after kyphoplasty at L1 and L2.  She was seen in follow-up with Dr. Estanislado Pandy 11/11/16 to discuss management options.  Patient states she was hopeful for continued improvement and elected to go home with strict instructions for activity. She states she continued to have back pain that persisted and remained relatively unchanged but has continued with limited activities and pain control at home.    IR consulted for patient with back pain s/p recent kyphoplasty.   CT  Abd/Pelvis 5/29 shows:  1. Coronary arteriosclerosis and aortic atherosclerosis. 2. Moderate gallbladder distention without secondary signs of acute cholecystitis. There is interposition of a portion of the gallbladder between the liver and ventral abdominal wall which might contribute pain with compression in the right upper quadrant. 3. No nephrolithiasis, renal mass or obstructive uropathy, however the urinary bladder is moderately distended without source of obstruction noted. If the patient is unable to void, Foley decompression may prove useful. 4. Moderate colonic stool burden consistent with constipation. No bowel  inflammation. 5. L1 and L2 vertebral body augmentation secondary to compression fractures. Dextroscoliosis of the lumbar spine.  MR 5/31 shows: 1. Subacute compression fracture involving the superior endplate of Z61 with mild 25% height loss without significant bony retropulsion. 2. Abnormal edema within the lateral and inferior left sacral ala, suspicious for possible insufficiency fracture. 3. Chronic compression fractures involving the T9, L1, and L2 vertebral bodies with sequelae of prior vertebral augmentation. 4. Stable multilevel degenerative changes involving the visible thoracic and lumbar spine. Similar right L4 foraminal impingement.   Past Medical History:  Diagnosis Date  . Anemia   . Anxiety   . Chronotropic incompetence 12/2012    potentially medication related; noted on CPET   . DDD (degenerative disc disease)   . Eczema   . GERD (gastroesophageal reflux disease)   . H/O hiatal hernia   . Hyperlipidemia   . Hypertension   . Hypothyroidism   . Osteopenia   . Septicemia ([place]) 2002   following UTI  . Vertigo, benign positional     Past Surgical History:  Procedure Laterality Date  . BREAST SURGERY     left biopsy  . CPET / MET - PFTS     Consistent with chronotropic incompetence; See attached report in the results section  . DILATION AND CURETTAGE OF UTERUS     x 2  . DOPPLER ECHOCARDIOGRAPHY  01/10/2013   Normal LV size and function. Normal EF. Air sclerosis but no stenosis  . ESOPHAGOGASTRODUODENOSCOPY  05/04/2012   Procedure: ESOPHAGOGASTRODUODENOSCOPY (EGD);  Surgeon: Inda Castle, MD;  Location: [facility];  Service: Endoscopy;  Laterality: N/A;  . EYE SURGERY     cataract extraction with ILO  ? eye  .  IR KYPHO EA ADDL LEVEL THORACIC OR LUMBAR  10/28/2016  . IR KYPHO LUMBAR INC FX REDUCE BONE BX UNI/BIL CANNULATION INC/IMAGING  10/28/2016  . IR RADIOLOGIST EVAL & MGMT  11/11/2016  . KNEE ARTHROSCOPY  2011   right  . TONSILLECTOMY    . TOTAL KNEE  ARTHROPLASTY  04/24/2012   Procedure: TOTAL KNEE ARTHROPLASTY;  Surgeon: Gearlean Alf, MD;  Location: [facility];  Service: Orthopedics;  Laterality: Right;    Allergies: Patient has no known allergies.  Medications: Prior to Admission medications   Medication Sig Start Date End Date Taking? Authorizing Provider  acetaminophen (TYLENOL) 325 MG tablet Take 650 mg by mouth every 6 (six) hours as needed for mild pain.    Yes [provider]  ALPRAZolam (XANAX) 0.25 MG tablet Take 0.25 mg by mouth at bedtime as needed for anxiety.  08/16/16  Yes [provider]  amLODipine (NORVASC) 10 MG tablet Take 1 tablet (10 mg total) by mouth daily. 05/08/12  Yes Rai, Ripudeep K, MD  aspirin EC 81 MG tablet Take 81 mg by mouth daily.   Yes [provider]  calcium carbonate (OS-CAL) 600 MG TABS Take 600 mg by mouth 2 (two) times daily with a meal.   Yes [provider]  cholecalciferol (VITAMIN D) 1000 UNITS tablet Take 1,000 Units by mouth daily.   Yes [provider]  DULoxetine HCl (CYMBALTA PO) Take 30 mg by mouth daily.    Yes [provider]  fish oil-omega-3 fatty acids 1000 MG capsule Take 1 g by mouth daily.   Yes [provider]  furosemide (LASIX) 20 MG tablet Take 20 mg by mouth daily as needed for fluid or edema.   Yes [provider]  levothyroxine (SYNTHROID, LEVOTHROID) 50 MCG tablet Take 50 mcg by mouth daily before breakfast.   Yes [provider]  losartan (COZAAR) 100 MG tablet TAKE 1 TABLET BY MOUTH  DAILY 05/27/16  Yes Leonie Man, MD  MELATONIN PO Take 1 tablet by mouth at bedtime as needed (sleep).   Yes [provider]  methocarbamol (ROBAXIN) 500 MG tablet Take 1 tablet (500 mg total) by mouth every 8 (eight) hours as needed for muscle spasms. 10/22/16  Yes Lavina Hamman, MD  Multiple Vitamin (MULTIVITAMIN WITH MINERALS) TABS tablet Take 1 tablet by mouth daily.   Yes [provider]   omeprazole (PRILOSEC) 40 MG capsule Take 40 mg by mouth daily.   Yes [provider]  polyethylene glycol (MIRALAX / GLYCOLAX) packet Take 17 g by mouth daily. Patient taking differently: Take 17 g by mouth daily as needed.  10/23/16  Yes Lavina Hamman, MD  traMADol (ULTRAM) 50 MG tablet Take 50 mg by mouth every 8 (eight) hours as needed for moderate pain or severe pain.   Yes [provider]  TYMLOS 3120 MCG/1.56ML SOPN Inject 80 mcg as directed daily.  12/04/16  Yes [provider]  vitamin B-12 (CYANOCOBALAMIN) 1000 MCG tablet Take 1,000 mcg by mouth daily.   Yes [provider]  HYDROcodone-acetaminophen (NORCO/VICODIN) 5-325 MG tablet Take 1 tablet by mouth every 6 (six) hours as needed for moderate pain. 10/22/16   Lavina Hamman, MD     Family History  Problem Relation Age of Onset  . Lung cancer Father 22       Died at age 57  . Stroke Mother 29       Died in her late 46s.  Marland Kitchen  Heart attack Mother 29    Social History   Social History  . Marital status: Widowed    Spouse name: N/A  . Number of children: 2  . Years of education: N/A   Social History Main Topics  . Smoking status: Former Smoker    Years: 0.00    Types: Cigarettes    Quit date: 05/03/1965  . Smokeless tobacco: Never Used  . Alcohol use Yes     Comment: occ glass wine  . Drug use: No  . Sexual activity: Not Asked   Other Topics Concern  . None   Social History Narrative   She is a widowed mother of 2.   She is a former smoker, with a distant history having quit in 1966.   She does drink an occasional glass of red wine.   She is just now started to try doing exercise with water aerobics.     Review of Systems  Constitutional: Negative for fatigue and fever.  Respiratory: Negative for cough and shortness of breath.   Cardiovascular: Negative for chest pain.  Genitourinary: Positive for flank pain.  Musculoskeletal: Positive for back pain.   Psychiatric/Behavioral: Negative for behavioral problems and confusion.    Vital Signs: BP (!) 158/56 (BP Location: Right Arm)   Pulse 74   Temp 98.4 F (36.9 C) (Oral)   Resp 16   Ht '5\' 5"'  (1.651 m)   Wt 150 lb 5.7 oz (68.2 kg)   SpO2 95%   BMI 25.02 kg/m   Physical Exam  Constitutional: She is oriented to person, place, and time. She appears well-developed.  Cardiovascular: Normal rate, regular rhythm and normal heart sounds.   Pulmonary/Chest: Effort normal and breath sounds normal. No respiratory distress.  Musculoskeletal: She exhibits tenderness (diffuse back pain, no point tenderness at rest, difficutly transitioning position due to mid and lower back pain).  Neurological: She is alert and oriented to person, place, and time.  Skin: Skin is warm and dry.  Psychiatric: She has a normal mood and affect. Her behavior is normal. Judgment and thought content normal.  Nursing note and vitals reviewed.   Mallampati Score:  MD Evaluation Airway: WNL Heart: WNL Abdomen: WNL Chest/ Lungs: WNL ASA  Classification: 3 Mallampati/Airway Score: Two  Imaging: Mr Lumbar Spine Wo Contrast  Result Date: 12/23/2016 CLINICAL DATA:  Initial evaluation for severe back pain, evaluate for compression fracture. EXAM: MRI LUMBAR SPINE WITHOUT CONTRAST TECHNIQUE: Multiplanar, multisequence MR imaging of the lumbar spine was performed. No intravenous contrast was administered. COMPARISON:  Prior CT from 12/21/2016 as well as previous MRI from 10/21/2016. FINDINGS: Segmentation: Normal segmentation. Lowest well-formed disc is labeled the L5-S1 level. Same numbering system is employed as on previous exam. Alignment: Dextroscoliosis of the thoracolumbar spine. Vertebral bodies otherwise normally aligned with preservation of the normal lumbar lordosis. Slight accentuation of the lower thoracic kyphosis due to T9 compression deformity. Vertebrae: Linear T1 hypointense, mildly T2/STIR hyperintense  signal intensity extends through the superior endplate of G87, consistent with compression fracture, favored to be subacute in nature. Mild 25% central height loss without bony retropulsion. No other acute compression fracture identified. Chronic compression deformities involving the T9, L1, and L2 vertebral bodies with sequelae of prior vertebral augmentation. There is a focus of abnormal edema within the partially visualized lower or left sacral ala just medial to the SI joint (series 6, image 15). Suggestion of linear T1 hypointensity through this region (series 7, image 48). Finding suspicious for possible insufficiency fracture.  Signal intensity within the vertebral body bone marrow otherwise within normal limits. Reactive endplate changes noted about the L4-5 interspace. No worrisome osseous lesions. Conus medullaris: Extends to the L1 level and appears normal. Paraspinal and other soft tissues: Paraspinous soft tissues demonstrate no acute abnormality. Fatty atrophy noted within the lower paraspinous musculature. Ectatic infrarenal aorta measures up to 2.5 cm. Approximate 13 mm cystic lesion at the pancreatic tail again noted, indeterminate. Disc levels: T8-9:  Degenerative disc bulge.  No stenosis. T9-10: Posterior element hypertrophy with mild and bilateral foraminal stenosis. T10-11:  Mild posterior element hypertrophy without stenosis. T11-12:  Unremarkable. T12-L1: Mild facet hypertrophy. 3 mm bony retropulsion related to the L1 compression fracture. No stenosis. L1-2: 3-4 mm bony retropulsion related to the L2 compression fracture. Mild facet hypertrophy. No stenosis. L2-3: Broad right paracentral disc protrusion extending into the right neural foramen. No significant canal or foraminal stenosis. No evidence for impingement. L3-4: Degenerative intervertebral disc space narrowing with diffuse disc bulge. Right worse than left facet degeneration. No significant canal or foraminal stenosis. L4-5: Diffuse  degenerative intervertebral disc space narrowing with disc bulge. Right far lateral/extraforaminal endplate spurring. Superimposed small central disc protrusion with resultant mild canal stenosis. Moderate to advanced right L4 foraminal narrowing relatively stable. L5-S1: Bilateral facet arthropathy. Superimposed 5 mm synovial cyst at the medial aspect of the right L5-S1 facet, stable. No significant stenosis. IMPRESSION: 1. Subacute compression fracture involving the superior endplate of O84 with mild 25% height loss without significant bony retropulsion. 2. Abnormal edema within the lateral and inferior left sacral ala, suspicious for possible insufficiency fracture. 3. Chronic compression fractures involving the T9, L1, and L2 vertebral bodies with sequelae of prior vertebral augmentation. 4. Stable multilevel degenerative changes involving the visible thoracic and lumbar spine. Similar right L4 foraminal impingement. 5. Stable 13 mm cystic structure at the pancreatic tail, indeterminate. Again, 1 year follow-up MRI suggested to document stability. Electronically Signed   By: Jeannine Boga M.D.   On: 12/23/2016 14:37   Ct Abdomen Pelvis W Contrast  Result Date: 12/21/2016 CLINICAL DATA:  Bilateral flank pain starting last night with vomiting x1. EXAM: CT ABDOMEN AND PELVIS WITH CONTRAST TECHNIQUE: Multidetector CT imaging of the abdomen and pelvis was performed using the standard protocol following bolus administration of intravenous contrast. CONTRAST:  132m ISOVUE-300 IOPAMIDOL (ISOVUE-300) INJECTION 61% COMPARISON:  05/26/2010 CT FINDINGS: Lower chest: Normal visualized cardiac chambers. No pericardial effusion. Coronary arteriosclerosis is noted the circumflex. Hepatobiliary: Homogeneous attenuation of the liver without biliary dilatation. Moderate gallbladder distention with portions of the gallbladder interposed between the liver and ventral abdominal wall. No wall thickening or pericholecystic  fluid. No gallstones are noted. Pancreas: No focal pancreatic mass. Mild ductal ectasia to 4 mm. No pancreatic inflammation. Spleen: No splenic mass or enlargement. Adrenals/Urinary Tract: Normal bilateral adrenal glands and kidneys. Extrarenal pelvis on the left. Distended urinary bladder without calculus or mass. Stomach/Bowel: Contrast distention of the stomach. Normal small bowel rotation. Increased colonic stool burden consistent with constipation. No bowel obstruction or inflammation. Normal appendix. Vascular/Lymphatic: Aortoiliac atherosclerosis without aneurysmal dilatation. The abdominal aorta is ectatic in appearance. Reproductive: Normal uterus.  No adnexal mass. Other: No abdominal wall hernia or abnormality. No abdominopelvic ascites. Musculoskeletal: L1 and L2 vertebral augmentation for compression fractures. Dextroscoliosis the lumbar spine. No acute nor suspicious osseous abnormalities. IMPRESSION: 1. Coronary arteriosclerosis and aortic atherosclerosis. 2. Moderate gallbladder distention without secondary signs of acute cholecystitis. There is interposition of a portion of the gallbladder between the liver  and ventral abdominal wall which might contribute pain with compression in the right upper quadrant. 3. No nephrolithiasis, renal mass or obstructive uropathy, however the urinary bladder is moderately distended without source of obstruction noted. If the patient is unable to void, Foley decompression may prove useful. 4. Moderate colonic stool burden consistent with constipation. No bowel inflammation. 5. L1 and L2 vertebral body augmentation secondary to compression fractures. Dextroscoliosis of the lumbar spine. Electronically Signed   By: Ashley Royalty M.D.   On: 12/21/2016 22:28    Labs:  CBC:  Recent Labs  10/28/16 0854 12/21/16 2022 12/22/16 0604 12/23/16 0516  WBC 4.5 7.7 5.9 6.2  HGB 10.8* 11.4* 11.4* 12.2  HCT 33.4* 33.1* 33.4* 36.4  PLT 88* 94* 85* 96*     COAGS:  Recent Labs  10/28/16 0854  INR 0.98  APTT 34    BMP:  Recent Labs  10/28/16 0854 12/21/16 2022 12/22/16 0604 12/23/16 0516  NA 132* 124* 127* 132*  K 4.4 5.0 3.9 4.5  CL 99* 90* 93* 97*  CO2 '25 25 26 26  ' GLUCOSE 109* 112* 101* 106*  BUN '16 15 11 15  ' CALCIUM 9.6 9.9 9.4 9.6  CREATININE 0.51 0.65 0.46 0.55  GFRNONAA >60 >60 >60 >60  GFRAA >60 >60 >60 >60    LIVER FUNCTION TESTS:  Recent Labs  09/06/16 0234 12/21/16 2022  BILITOT 0.7 0.5  AST 45* 37  ALT 29 23  ALKPHOS 68 103  PROT 7.6 7.7  ALBUMIN 4.4 4.6    TUMOR MARKERS: No results for input(s): AFPTM, CEA, CA199, CHROMGRNA in the last 8760 hours.  Assessment and Plan: T11 Compression fracture Patient with persistent back pain s/p interventions for compression fractures in the past is found to have an acute compression fracture at T11.  Patient does have chronic back pain, but does state there are new elements to her pain especially when transitioning from sitting to standing.  Have started insurance process for possible kyphoplasty.  Patient was admitted with UTI/pyelonephritis.  She has been on Rocephin since late PM 5/29.  On exam, patient does not exhibit point tenderness in the mid-back, however does have significant pain in her mid and lower back when transitioning from sitting to standing, or lying to sitting.  She states that at rest her pain is improved, but chronic in the lower back.   Patient does feel as though a vertebroplasty/kyphoplasty would be helpful in managing her pain.  Discussed case with Dr. Erlinda Hong.  Patient would need to transfer to [facility] for procedure.  Will continue to work towards day and time of procedure and coordinate with medical team.  Thank you for this interesting consult.  I greatly enjoyed meeting Tabatha Razzano Biscoe and look forward to participating in their care.  A copy of this report was sent to the requesting provider on this date.  Electronically Signed: Docia Barrier, PA 12/23/2016, 3:36 PM   I spent a total of 40 Minutes    in face to face in clinical consultation, greater than 50% of which was counseling/coordinating care for back pain, T11 compression fracture.

## 2016-12-23 NOTE — Clinical Social Work Note (Signed)
Clinical Social Work Assessment  Patient Details  Name: [name] [name] MRN: [id number] Date of Birth: 07/19/1931  Date of referral:  12/23/16               Reason for consult:  Facility Placement, Discharge Planning                Permission sought to share information with:  Case Manager, Facility Medical sales representative, Family Supports Permission granted to share information::  Yes, Verbal Permission Granted  Name::        Agency::  [facility]  Relationship::  DINO: daughter in law  (in room at time of assessment)  Contact Information:     Housing/Transportation Living arrangements for the past 2 months:  Independent Dealer of Information:  Patient, Medical Team, Case Manager, Adult Children Patient Interpreter Needed:  None Criminal Activity/Legal Involvement Pertinent to Current Situation/Hospitalization:  No - Comment as needed Significant Relationships:  Adult Children, Other Family Members, Community Support, Friend Lives with:  Facility Resident Do you feel safe going back to the place where you live?  Yes Need for family participation in patient care:  Yes (Comment)  Care giving concerns:  Currently none noted by family at time of assessment or patient with regards to returning to ILF [facility].  Patient voices over the last couple of months she has had difficulty getting up out of bed and has been sleeping in her recliner due to back pain and limited by her doctor's recommendations.  Patient reports she has been living at ILF since July 2017 and has been very happy with her care.  Patient uses a walker and has assistance with outsource agency coming in to help with errands or house duties or walking at the facility.  Reports they will be available when she returns and also open to home health is eligible.   Patient voices she had to go to SNF at [facility] last year due to a fall and did not want to return as she likes and feels more  comfortable in her apartment. Patient is independent with ADLs.   Social Worker assessment / plan:  LCSW consulted as patient is from ILF with recommendation for higher level of care: SNF.  Patient is from [facility]. LCSW introduced self and explained role and reason for consult. Patient appreciative of assistance and information. Politely declines recommendation for SNF, feels safe in apartment and wants to return ILF.  LCSW explained benefits of SNF and placement, patient aware reports admission last year and not interested. Bed is available at facility if warranted per discussion with SW at [facility].  DIL: Dino in room during assessment.  Declines any concerns or questions other than option of home health.  CM made aware patient and family open to [facility] and will discuss.  Plan:  Return to ILF when medically stable. No other SW interventions needed at this time.  Will sign off.  Employment status:    retired Database administrator PT Recommendations:  Skilled Nursing Facility Information / Referral to community resources:  Skilled Nursing Facility  Patient/Family's Response to care:  Understanding and only agreeable to return to ILF  Patient/Family's Understanding of and Emotional Response to Diagnosis, Current Treatment, and Prognosis:  Patient aware of her admission for infection of kidney and current treatment AEB understands medications, possible MRI, and recommendations per medical provider when asked during assessment. Patient voices frustration with current walking ability stating "I  know I should be doing more at this time and capable of more".  Patient reports strong levels of independence and has low frustration levels at this time due to the infection and back pain.  Emotional Assessment Appearance:  Appears stated age Attitude/Demeanor/Rapport:    Affect (typically observed):  Accepting, Adaptable, Pleasant Orientation:  Oriented to Self, Oriented  to Place, Oriented to  Time, Oriented to Situation Alcohol / Substance use:  Not Applicable Psych involvement (Current and /or in the community):  Outpatient Provider (anxiety and depression  (medications on board no current stressors/behaviors))  Discharge Needs  Concerns to be addressed:  No discharge needs identified Readmission within the last 30 days:  No Current discharge risk:  None Barriers to Discharge:  No Barriers Identified   Raye SorrowCoble, Mackinzee Roszak N, LCSW 12/23/2016, 11:39 AM

## 2016-12-23 NOTE — Care Management Note (Signed)
Case Management Note  Patient Details  Name: [name] [name] MRN: [id number] Date of Birth: 12/21/1930  Subjective/Objective: Per PT note & my conversation-patient needs SNF level care @ d/c-patient declines SNF, & [facility], she has custodial level care already set up. Patient states her dr has told her that she should not lift, or bend(that's why she doesn't feel she needs PT/OT). Informed her that insurance doesn't pay for custodial level care, only skilled level care like HHPT/HHOT. MD updated.                  Action/Plan:d/c plan [facility]   Expected Discharge Date:                  Expected Discharge Plan:  Skilled Nursing Facility  In-House Referral:  Clinical Social Work  Discharge planning Services  CM Consult  Post Acute Care Choice:    Choice offered to:     DME Arranged:    DME Agency:     HH Arranged:    HH Agency:     Status of Service:  In process, will continue to follow  If discussed at Long Length of Stay Meetings, dates discussed:    Additional Comments:  [name] [name], [name] Briney, RN 12/23/2016, 12:43 PM

## 2016-12-23 NOTE — Progress Notes (Signed)
OT Cancellation Note  Patient Details Name: [name] [name] MRN: [id number] DOB: 01/23/1931   Cancelled Treatment:    Reason Eval/Treat Not Completed: Patient at procedure or test/ unavailable. Also awaiting TLSO.    Devyn Griffing 12/23/2016, 1:13 PM  Marica OtterMaryellen Maelys Kinnick, OTR/L [phone] 12/23/2016

## 2016-12-23 NOTE — Progress Notes (Addendum)
PROGRESS NOTE  [name] [name] ZOX:[id number] DOB: 05/26/1931 DOA: 12/21/2016 PCP: Geoffry ParadiseAronson, Richard, MD  HPI/Recap of past 24 hours:  Patient report side pain is better, significant low back pain with changing position She now has a foley placed due to urinary retention,  Daughter in law at bedside  Assessment/Plan: Principal Problem:   Acute pyelonephritis Active Problems:   Essential hypertension   Hyponatremia   Thrombocytopenia (HCC)   GERD (gastroesophageal reflux disease)   Depression with anxiety   Acute respiratory failure with hypoxia (HCC)  UTI/urinary retention+/-Acute pyelonephritis:  --Ct ab /pel "Tract: Normal bilateral adrenal glands and kidneys. Extrarenal pelvis on the left. Distended urinary bladder without calculus or mass. " --ua + bacteria, wbc TNTC, rbc TNTC, large leukocytes,  --Post void residual 945, though patient denies feeling full, insert foley --Patient does not have fever or leukocytosis. Currently hemodynamically stable - blood culture obtained after abx given, urine culture in process, continue rocephin for now -urinary retention likely from recent opioids anlagesics for back pain, but will get mri spine to rule out cord compression   Hyponatremia: Na 124. Mental status normal. Most likely due to nausea, vomiting and decreased oral intake -IVF: 100 cc/ h of NS -improving, continue ivf  Back pain: and bilateral side pain, s/p recent kyphoplasty in 10/2016 Continue to c.o back pain, prn pain meds, PT, back brace ordered MRI lumbar spine, IR consulted   Essential hypertension: -Continue amlodipine and losartan -Hold Lasix due to hyponatremia -IV hydralazine when necessary  GERD: -Protonix  Depression and anxiety: no suicidal or homicidal ideations. -Continue home medications: Cymbalta, when necessary Xanax  Chronic Thrombocytopenia (HCC): platelet 94. No bleeding tendency. -Follow-up by CBC  constipation: stool softener  DVT  ppx: SCD Code Status: Full code Family Communication: daughter in law at bedside  Disposition Plan:  pending Consults called:  IR  Procedures:  Foley insertion  Antibiotics:  rocephin   Objective: BP (!) 158/56 (BP Location: Right Arm)   Pulse 74   Temp 98.4 F (36.9 C) (Oral)   Resp 16   Ht 5\' 5"  (1.651 m)   Wt 68.2 kg (150 lb 5.7 oz)   SpO2 95%   BMI 25.02 kg/m   Intake/Output Summary (Last 24 hours) at 12/23/16 0749 Last data filed at 12/23/16 0700  Gross per 24 hour  Intake             3410 ml  Output             5555 ml  Net            -2145 ml   Filed Weights   12/21/16 1923 12/22/16 0207  Weight: 67.1 kg (148 lb) 68.2 kg (150 lb 5.7 oz)    Exam:   General:  Frail, chronically ill   Cardiovascular: RRR  Respiratory: CTABL  Abdomen: Soft/ND/NT, positive BS  Musculoskeletal: No Edema  Neuro: aaox3  Data Reviewed: Basic Metabolic Panel:  Recent Labs Lab 12/21/16 2022 12/22/16 0604 12/23/16 0516  NA 124* 127* 132*  K 5.0 3.9 4.5  CL 90* 93* 97*  CO2 25 26 26   GLUCOSE 112* 101* 106*  BUN 15 11 15   CREATININE 0.65 0.46 0.55  CALCIUM 9.9 9.4 9.6  MG  --   --  2.0   Liver Function Tests:  Recent Labs Lab 12/21/16 2022  AST 37  ALT 23  ALKPHOS 103  BILITOT 0.5  PROT 7.7  ALBUMIN 4.6   No results for input(s): LIPASE,  AMYLASE in the last 168 hours. No results for input(s): AMMONIA in the last 168 hours. CBC:  Recent Labs Lab 12/21/16 2022 12/22/16 0604 12/23/16 0516  WBC 7.7 5.9 6.2  NEUTROABS  --   --  3.6  HGB 11.4* 11.4* 12.2  HCT 33.1* 33.4* 36.4  MCV 84.7 85.0 85.2  PLT 94* 85* 96*   Cardiac Enzymes:   No results for input(s): CKTOTAL, CKMB, CKMBINDEX, TROPONINI in the last 168 hours. BNP (last 3 results) No results for input(s): BNP in the last 8760 hours.  ProBNP (last 3 results) No results for input(s): PROBNP in the last 8760 hours.  CBG: No results for input(s): GLUCAP in the last 168 hours.  Recent  Results (from the past 240 hour(s))  MRSA PCR Screening     Status: None   Collection Time: 12/22/16  3:25 AM  Result Value Ref Range Status   MRSA by PCR NEGATIVE NEGATIVE Final    Comment:        The GeneXpert MRSA Assay (FDA approved for NASAL specimens only), is one component of a comprehensive MRSA colonization surveillance program. It is not intended to diagnose MRSA infection nor to guide or monitor treatment for MRSA infections.   Culture, blood (routine x 2)     Status: None (Preliminary result)   Collection Time: 12/22/16  5:22 PM  Result Value Ref Range Status   Specimen Description BLOOD LEFT HAND  Final   Special Requests   Final    BOTTLES DRAWN AEROBIC AND ANAEROBIC Blood Culture adequate volume   Culture PENDING  Incomplete   Report Status PENDING  Incomplete  Culture, blood (routine x 2)     Status: None (Preliminary result)   Collection Time: 12/22/16  5:34 PM  Result Value Ref Range Status   Specimen Description BLOOD LEFT HAND  Final   Special Requests IN PEDIATRIC BOTTLE Blood Culture adequate volume  Final   Culture PENDING  Incomplete   Report Status PENDING  Incomplete     Studies: No results found.  Scheduled Meds: . Abaloparatide  80 mcg Injection Daily  . amLODipine  10 mg Oral QHS  . aspirin EC  81 mg Oral Daily  . calcium carbonate  1,250 mg Oral BID WC  . cholecalciferol  1,000 Units Oral Daily  . DULoxetine  20 mg Oral Daily  . levothyroxine  50 mcg Oral QAC breakfast  . losartan  100 mg Oral Daily  . multivitamin with minerals  1 tablet Oral Daily  . omega-3 acid ethyl esters  1 g Oral Daily  . pantoprazole  40 mg Oral Daily  . polyethylene glycol  17 g Oral Daily  . senna-docusate  1 tablet Oral BID  . vitamin B-12  1,000 mcg Oral Daily    Continuous Infusions: . sodium chloride 100 mL/hr at 12/23/16 0120  . cefTRIAXone (ROCEPHIN)  IV Stopped (12/22/16 2224)     Time spent: >41mins   Binnie Droessler MD, PhD  [facility] Pager [phone]. If 7PM-7AM, please contact night-coverage at [email or web address], password [facility] 12/23/2016, 7:49 AM  LOS: 0 days

## 2016-12-24 ENCOUNTER — Ambulatory Visit (HOSPITAL_COMMUNITY)
Admit: 2016-12-24 | Discharge: 2016-12-24 | Disposition: A | Discharge status: Home / Self Care | Payer: Medicare Other | Attending: Internal Medicine | Admitting: Internal Medicine

## 2016-12-24 HISTORY — PX: IR KYPHO THORACIC WITH BONE BIOPSY: IMG5518

## 2016-12-24 LAB — URINALYSIS, COMPLETE (UACMP) WITH MICROSCOPIC
BILIRUBIN URINE: NEGATIVE
Bacteria, UA: NONE SEEN
GLUCOSE, UA: NEGATIVE mg/dL
Ketones, ur: NEGATIVE mg/dL
NITRITE: NEGATIVE
Protein, ur: NEGATIVE mg/dL
SPECIFIC GRAVITY, URINE: 1.006 (ref 1.005–1.030)
Squamous Epithelial / LPF: NONE SEEN
pH: 7 (ref 5.0–8.0)

## 2016-12-24 LAB — BASIC METABOLIC PANEL
ANION GAP: 8 (ref 5–15)
BUN: 12 mg/dL (ref 6–20)
CALCIUM: 9.1 mg/dL (ref 8.9–10.3)
CO2: 24 mmol/L (ref 22–32)
CREATININE: 0.49 mg/dL (ref 0.44–1.00)
Chloride: 98 mmol/L — ABNORMAL LOW (ref 101–111)
Glucose, Bld: 99 mg/dL (ref 65–99)
Potassium: 4.1 mmol/L (ref 3.5–5.1)
SODIUM: 130 mmol/L — AB (ref 135–145)

## 2016-12-24 LAB — CBC WITH DIFFERENTIAL/PLATELET
BASOS ABS: 0 10*3/uL (ref 0.0–0.1)
BASOS PCT: 0 %
EOS ABS: 0.1 10*3/uL (ref 0.0–0.7)
Eosinophils Relative: 1 %
HCT: 31.2 % — ABNORMAL LOW (ref 36.0–46.0)
HEMOGLOBIN: 10.5 g/dL — AB (ref 12.0–15.0)
Lymphocytes Relative: 25 %
Lymphs Abs: 1.2 10*3/uL (ref 0.7–4.0)
MCH: 28.8 pg (ref 26.0–34.0)
MCHC: 33.7 g/dL (ref 30.0–36.0)
MCV: 85.7 fL (ref 78.0–100.0)
Monocytes Absolute: 1.2 10*3/uL — ABNORMAL HIGH (ref 0.1–1.0)
Monocytes Relative: 25 %
Neutro Abs: 2.4 10*3/uL (ref 1.7–7.7)
Neutrophils Relative %: 50 %
Platelets: 72 10*3/uL — ABNORMAL LOW (ref 150–400)
RBC: 3.64 MIL/uL — ABNORMAL LOW (ref 3.87–5.11)
RDW: 17.4 % — ABNORMAL HIGH (ref 11.5–15.5)
WBC: 4.9 10*3/uL (ref 4.0–10.5)

## 2016-12-24 LAB — URINE CULTURE

## 2016-12-24 LAB — PROTIME-INR
INR: 0.92
Prothrombin Time: 12.3 seconds (ref 11.4–15.2)

## 2016-12-24 MED ORDER — FENTANYL CITRATE (PF) 100 MCG/2ML IJ SOLN
INTRAMUSCULAR | Status: AC
  Filled 2016-12-24: qty 2

## 2016-12-24 MED ORDER — TOBRAMYCIN SULFATE 1.2 G IJ SOLR
INTRAMUSCULAR | Status: AC
  Filled 2016-12-24: qty 1.2

## 2016-12-24 MED ORDER — FENTANYL CITRATE (PF) 100 MCG/2ML IJ SOLN
INTRAMUSCULAR | Status: AC | PRN
  Administered 2016-12-24 (×4): 25 ug via INTRAVENOUS

## 2016-12-24 MED ORDER — IOPAMIDOL (ISOVUE-300) INJECTION 61%
INTRAVENOUS | Status: AC
  Administered 2016-12-24: 10 mL
  Filled 2016-12-24: qty 50

## 2016-12-24 MED ORDER — HYDROMORPHONE HCL 1 MG/ML IJ SOLN
INTRAMUSCULAR | Status: AC | PRN
  Administered 2016-12-24: 1 mg via INTRAVENOUS

## 2016-12-24 MED ORDER — AMOXICILLIN-POT CLAVULANATE 875-125 MG PO TABS
1.0000 | ORAL_TABLET | Freq: Two times a day (BID) | ORAL | Status: DC
  Administered 2016-12-24 – 2016-12-26 (×5): 1 via ORAL
  Filled 2016-12-24 (×5): qty 1

## 2016-12-24 MED ORDER — BUPIVACAINE HCL (PF) 0.25 % IJ SOLN
INTRAMUSCULAR | Status: AC
  Filled 2016-12-24: qty 30

## 2016-12-24 MED ORDER — MIDAZOLAM HCL 2 MG/2ML IJ SOLN
INTRAMUSCULAR | Status: AC | PRN
  Administered 2016-12-24 (×2): 1 mg via INTRAVENOUS

## 2016-12-24 MED ORDER — TOBRAMYCIN SULFATE 1.2 G IJ SOLR
INTRAMUSCULAR | Status: AC | PRN
  Administered 2016-12-24: .2 g via TOPICAL

## 2016-12-24 MED ORDER — DEXTROSE 5 % IV SOLN
2.0000 g | Freq: Once | INTRAVENOUS | Status: DC
  Filled 2016-12-24: qty 2

## 2016-12-24 MED ORDER — LORAZEPAM 2 MG/ML IJ SOLN
INTRAMUSCULAR | Status: AC | PRN
  Administered 2016-12-24: 1 mg via INTRAVENOUS

## 2016-12-24 MED ORDER — CEFAZOLIN SODIUM-DEXTROSE 2-4 GM/100ML-% IV SOLN
INTRAVENOUS | Status: AC
  Administered 2016-12-24: 2000 mg
  Filled 2016-12-24: qty 100

## 2016-12-24 MED ORDER — HYDROMORPHONE HCL 1 MG/ML IJ SOLN
1.0000 mg | Freq: Once | INTRAMUSCULAR | Status: DC
Start: 2016-12-24 — End: 2016-12-25
  Filled 2016-12-24: qty 1

## 2016-12-24 MED ORDER — LEVOTHYROXINE SODIUM 75 MCG PO TABS
75.0000 ug | ORAL_TABLET | Freq: Every day | ORAL | Status: DC
  Administered 2016-12-25 – 2016-12-26 (×2): 75 ug via ORAL
  Filled 2016-12-24 (×2): qty 1

## 2016-12-24 MED ORDER — GELATIN ABSORBABLE 12-7 MM EX MISC
CUTANEOUS | Status: AC
  Filled 2016-12-24: qty 1

## 2016-12-24 MED ORDER — BISACODYL 10 MG RE SUPP
10.0000 mg | Freq: Every day | RECTAL | Status: AC
  Administered 2016-12-24: 10 mg via RECTAL
  Filled 2016-12-24 (×2): qty 1

## 2016-12-24 MED ORDER — BUPIVACAINE HCL 0.25 % IJ SOLN
INTRAMUSCULAR | Status: AC | PRN
  Administered 2016-12-24: 20 mL

## 2016-12-24 MED ORDER — MIDAZOLAM HCL 2 MG/2ML IJ SOLN
INTRAMUSCULAR | Status: AC
  Filled 2016-12-24: qty 2

## 2016-12-24 NOTE — Procedures (Signed)
S/P T 11 balloon KP 

## 2016-12-24 NOTE — Sedation Documentation (Signed)
CO2 monitor not recording properly due to procedure position.

## 2016-12-24 NOTE — Sedation Documentation (Signed)
Pt. Experiencing procedural pain. Additional medication given.

## 2016-12-24 NOTE — Plan of Care (Signed)
Problem: Bowel/Gastric: Goal: Will not experience complications related to bowel motility Outcome: Not Progressing Pt still unable to have a good BM. Laxatives administered.

## 2016-12-24 NOTE — Progress Notes (Signed)
PROGRESS NOTE  [name] [name] [id number] DOB: 09/17/1930 DOA: 12/21/2016 PCP: Geoffry ParadiseAronson, Richard, MD  HPI/Recap of past 24 hours:  Patient reports she does not like the back brace She is returned from kyphoplasty, still no bm, foley in place.   Assessment/Plan: Principal Problem:   Acute pyelonephritis Active Problems:   Essential hypertension   Hyponatremia   Thrombocytopenia (HCC)   GERD (gastroesophageal reflux disease)   Depression with anxiety   Acute respiratory failure with hypoxia (HCC)  UTI/urinary retention+/-Acute pyelonephritis:  --Ct ab /pel "Tract: Normal bilateral adrenal glands and kidneys. Extrarenal pelvis on the left. Distended urinary bladder without calculus or mass. " --ua + bacteria, wbc TNTC, rbc TNTC, large leukocytes,  --Post void residual 945, though patient denies feeling full, insert foley --Patient does not have fever or leukocytosis. Currently hemodynamically stable - blood culture obtained after abx given, urine culture + aerococcus, she is treated with rocephin since admission, change to augmentin today ( I have discussed case  with ID). -urinary retention likely from recent opioids anlagesics for back pain, mri spine negative for cord compression   Hyponatremia: Na 124. Mental status normal. Most likely due to nausea, vomiting and decreased oral intake -received IVF -improving, d/c ivf repeat lab in am  Back pain: and bilateral side pain, s/p recent kyphoplasty in 10/2016 Continue to c.o back pain, prn pain meds, PT, back brace ordered MRI lumbar spine + subacute T11 fracture,  IR consulted , s/p kyphoplasty on 6/1 ( no bending, no heavy lifting, no twisting 1-3 weeks after procedure, but patient should start physical therapy next day after procedure per communication with IR.     Essential hypertension: -Continue amlodipine and losartan -Hold Lasix due to hyponatremia -IV hydralazine when necessary  GERD: -Protonix  Depression and  anxiety: no suicidal or homicidal ideations. -Continue home medications: Cymbalta, when necessary Xanax  Chronic Thrombocytopenia (HCC): platelet 94. No bleeding tendency. -Follow-up by CBC  constipation: stool softener  DVT ppx: SCD Code Status: Full code Family Communication: patient  Disposition Plan:  SNF vs home health Consults called:  IR  Procedures:  Foley insertion  kyphoplasty  Antibiotics:  Rocephin from admission to 6/1  augmentin from 6/1   Objective: BP (!) 155/65 (BP Location: Left Arm)   Pulse 71   Temp 97.7 F (36.5 C) (Oral)   Resp 16   Ht 5\' 5"  (1.651 m)   Wt 68.2 kg (150 lb 5.7 oz)   SpO2 93%   BMI 25.02 kg/m   Intake/Output Summary (Last 24 hours) at 12/24/16 0926 Last data filed at 12/24/16 0835  Gross per 24 hour  Intake          2723.33 ml  Output             4260 ml  Net         -1536.67 ml   Filed Weights   12/21/16 1923 12/22/16 0207  Weight: 67.1 kg (148 lb) 68.2 kg (150 lb 5.7 oz)    Exam:   General:  Frail, chronically ill , foley in place  Cardiovascular: RRR  Respiratory: CTABL  Abdomen: Soft/ND/NT, positive BS  Musculoskeletal: No Edema  Neuro: aaox3  Data Reviewed: Basic Metabolic Panel:  Recent Labs Lab 12/21/16 2022 12/22/16 0604 12/23/16 0516 12/24/16 0508  NA 124* 127* 132* 130*  K 5.0 3.9 4.5 4.1  CL 90* 93* 97* 98*  CO2 25 26 26 24   GLUCOSE 112* 101* 106* 99  BUN 15 11 15  12  CREATININE 0.65 0.46 0.55 0.49  CALCIUM 9.9 9.4 9.6 9.1  MG  --   --  2.0  --    Liver Function Tests:  Recent Labs Lab 12/21/16 2022  AST 37  ALT 23  ALKPHOS 103  BILITOT 0.5  PROT 7.7  ALBUMIN 4.6   No results for input(s): LIPASE, AMYLASE in the last 168 hours. No results for input(s): AMMONIA in the last 168 hours. CBC:  Recent Labs Lab 12/21/16 2022 12/22/16 0604 12/23/16 0516 12/24/16 0508  WBC 7.7 5.9 6.2 4.9  NEUTROABS  --   --  3.6 2.4  HGB 11.4* 11.4* 12.2 10.5*  HCT 33.1* 33.4* 36.4  31.2*  MCV 84.7 85.0 85.2 85.7  PLT 94* 85* 96* 72*   Cardiac Enzymes:   No results for input(s): CKTOTAL, CKMB, CKMBINDEX, TROPONINI in the last 168 hours. BNP (last 3 results) No results for input(s): BNP in the last 8760 hours.  ProBNP (last 3 results) No results for input(s): PROBNP in the last 8760 hours.  CBG: No results for input(s): GLUCAP in the last 168 hours.  Recent Results (from the past 240 hour(s))  Urine culture     Status: Abnormal   Collection Time: 12/21/16  9:05 PM  Result Value Ref Range Status   Specimen Description URINE, CATHETERIZED  Final   Special Requests NONE  Final   Culture (A)  Final    >=100,000 COLONIES/mL AEROCOCCUS URINAE Standardized susceptibility testing for this organism is not available. Performed at [facility], [address], [place], [place] [address]    Report Status 12/24/2016 FINAL  Final  MRSA PCR Screening     Status: None   Collection Time: 12/22/16  3:25 AM  Result Value Ref Range Status   MRSA by PCR NEGATIVE NEGATIVE Final    Comment:        The GeneXpert MRSA Assay (FDA approved for NASAL specimens only), is one component of a comprehensive MRSA colonization surveillance program. It is not intended to diagnose MRSA infection nor to guide or monitor treatment for MRSA infections.   Culture, blood (routine x 2)     Status: None (Preliminary result)   Collection Time: 12/22/16  5:22 PM  Result Value Ref Range Status   Specimen Description BLOOD LEFT HAND  Final   Special Requests   Final    BOTTLES DRAWN AEROBIC AND ANAEROBIC Blood Culture adequate volume   Culture   Final    NO GROWTH < 24 HOURS Performed at [facility], [address] [place], [place] [address]    Report Status PENDING  Incomplete  Culture, blood (routine x 2)     Status: None (Preliminary result)   Collection Time: 12/22/16  5:34 PM  Result Value Ref Range Status   Specimen Description BLOOD LEFT HAND  Final   Special  Requests IN PEDIATRIC BOTTLE Blood Culture adequate volume  Final   Culture   Final    NO GROWTH < 24 HOURS Performed at [facility], [address] [place], [place] [address]    Report Status PENDING  Incomplete     Studies: Mr Lumbar Spine Wo Contrast  Result Date: 12/23/2016 CLINICAL DATA:  Initial evaluation for severe back pain, evaluate for compression fracture. EXAM: MRI LUMBAR SPINE WITHOUT CONTRAST TECHNIQUE: Multiplanar, multisequence MR imaging of the lumbar spine was performed. No intravenous contrast was administered. COMPARISON:  Prior CT from 12/21/2016 as well as previous MRI from 10/21/2016. FINDINGS:  Segmentation: Normal segmentation. Lowest well-formed disc is labeled the L5-S1 level. Same numbering system is employed as on previous exam. Alignment: Dextroscoliosis of the thoracolumbar spine. Vertebral bodies otherwise normally aligned with preservation of the normal lumbar lordosis. Slight accentuation of the lower thoracic kyphosis due to T9 compression deformity. Vertebrae: Linear T1 hypointense, mildly T2/STIR hyperintense signal intensity extends through the superior endplate of T11, consistent with compression fracture, favored to be subacute in nature. Mild 25% central height loss without bony retropulsion. No other acute compression fracture identified. Chronic compression deformities involving the T9, L1, and L2 vertebral bodies with sequelae of prior vertebral augmentation. There is a focus of abnormal edema within the partially visualized lower or left sacral ala just medial to the SI joint (series 6, image 15). Suggestion of linear T1 hypointensity through this region (series 7, image 48). Finding suspicious for possible insufficiency fracture. Signal intensity within the vertebral body bone marrow otherwise within normal limits. Reactive endplate changes noted about the L4-5 interspace. No worrisome osseous lesions. Conus medullaris: Extends to the L1 level and  appears normal. Paraspinal and other soft tissues: Paraspinous soft tissues demonstrate no acute abnormality. Fatty atrophy noted within the lower paraspinous musculature. Ectatic infrarenal aorta measures up to 2.5 cm. Approximate 13 mm cystic lesion at the pancreatic tail again noted, indeterminate. Disc levels: T8-9:  Degenerative disc bulge.  No stenosis. T9-10: Posterior element hypertrophy with mild and bilateral foraminal stenosis. T10-11:  Mild posterior element hypertrophy without stenosis. T11-12:  Unremarkable. T12-L1: Mild facet hypertrophy. 3 mm bony retropulsion related to the L1 compression fracture. No stenosis. L1-2: 3-4 mm bony retropulsion related to the L2 compression fracture. Mild facet hypertrophy. No stenosis. L2-3: Broad right paracentral disc protrusion extending into the right neural foramen. No significant canal or foraminal stenosis. No evidence for impingement. L3-4: Degenerative intervertebral disc space narrowing with diffuse disc bulge. Right worse than left facet degeneration. No significant canal or foraminal stenosis. L4-5: Diffuse degenerative intervertebral disc space narrowing with disc bulge. Right far lateral/extraforaminal endplate spurring. Superimposed small central disc protrusion with resultant mild canal stenosis. Moderate to advanced right L4 foraminal narrowing relatively stable. L5-S1: Bilateral facet arthropathy. Superimposed 5 mm synovial cyst at the medial aspect of the right L5-S1 facet, stable. No significant stenosis. IMPRESSION: 1. Subacute compression fracture involving the superior endplate of T11 with mild 25% height loss without significant bony retropulsion. 2. Abnormal edema within the lateral and inferior left sacral ala, suspicious for possible insufficiency fracture. 3. Chronic compression fractures involving the T9, L1, and L2 vertebral bodies with sequelae of prior vertebral augmentation. 4. Stable multilevel degenerative changes involving the  visible thoracic and lumbar spine. Similar right L4 foraminal impingement. 5. Stable 13 mm cystic structure at the pancreatic tail, indeterminate. Again, 1 year follow-up MRI suggested to document stability. Electronically Signed   By: Rise Mu M.D.   On: 12/23/2016 14:37    Scheduled Meds: . Abaloparatide  80 mcg Injection Daily  . amLODipine  10 mg Oral QHS  . aspirin EC  81 mg Oral Daily  . bisacodyl  10 mg Rectal Daily  . calcium carbonate  1,250 mg Oral BID WC  . cholecalciferol  1,000 Units Oral Daily  . DULoxetine  20 mg Oral Daily  . levothyroxine  50 mcg Oral QAC breakfast  . lidocaine  1 patch Transdermal Q24H  . losartan  100 mg Oral Daily  . multivitamin with minerals  1 tablet Oral Daily  . omega-3 acid ethyl esters  1 g Oral Daily  . pantoprazole  40 mg Oral Daily  . polyethylene glycol  17 g Oral Daily  . senna-docusate  1 tablet Oral BID  . vitamin B-12  1,000 mcg Oral Daily    Continuous Infusions: . sodium chloride 100 mL/hr at 12/23/16 2216  . cefTRIAXone (ROCEPHIN)  IV Stopped (12/23/16 2246)  . cefTRIAXone (ROCEPHIN)  IV       Time spent: >19mins   Krysteena Stalker MD, PhD  [facility] Pager [phone]. If 7PM-7AM, please contact night-coverage at [email or web address], password [facility] 12/24/2016, 9:26 AM  LOS: 1 day

## 2016-12-24 NOTE — Progress Notes (Signed)
PT Cancellation Note  Patient Details Name: [name] CombesBetty J Oldaker MRN: [id number] DOB: 09/25/1930   Cancelled Treatment:    Reason Eval/Treat Not Completed: Patient at procedure or test/unavailable (having  Kyphoplasty today. )   Rada HayHill, Jaiyah Beining Elizabeth 12/24/2016, 3:02 PM

## 2016-12-24 NOTE — Progress Notes (Signed)
OT Cancellation Note  Patient Details Name: [name] CombesBetty J [name] MRN: [id number] DOB: 10/19/1930   Cancelled Treatment:    Reason Eval/Treat Not Completed: Patient at procedure or test/ unavailable  Will check on pt aas schedule allows [name] [name], OT [phone]  Einar CrowEDDING, Japheth Diekman D 12/24/2016, 10:42 AM

## 2016-12-24 NOTE — Discharge Instructions (Signed)
1.No stooping,bending or lifting more than 10 lbs for 2 weeks. °2.Use walker to ambulate for 2 weeks. °3.RTC PRN in 2 weeks °

## 2016-12-24 NOTE — Plan of Care (Signed)
Problem: Pain Managment: Goal: General experience of comfort will improve Outcome: Progressing Pt states her pain and discomfort have improved since her surgery.

## 2016-12-25 ENCOUNTER — Other Ambulatory Visit: Payer: Self-pay | Admitting: Radiology

## 2016-12-25 DIAGNOSIS — M4854XA Collapsed vertebra, not elsewhere classified, thoracic region, initial encounter for fracture: Secondary | ICD-10-CM

## 2016-12-25 LAB — BASIC METABOLIC PANEL
Anion gap: 8 (ref 5–15)
BUN: 13 mg/dL (ref 6–20)
CHLORIDE: 96 mmol/L — AB (ref 101–111)
CO2: 28 mmol/L (ref 22–32)
CREATININE: 0.53 mg/dL (ref 0.44–1.00)
Calcium: 9.3 mg/dL (ref 8.9–10.3)
GFR calc non Af Amer: >60 mL/min (ref >60)
Glucose, Bld: 96 mg/dL (ref 65–99)
Potassium: 4.2 mmol/L (ref 3.5–5.1)
SODIUM: 132 mmol/L — AB (ref 135–145)

## 2016-12-25 MED ORDER — MAGNESIUM HYDROXIDE 400 MG/5ML PO SUSP
30.0000 mL | Freq: Once | ORAL | Status: AC
  Administered 2016-12-25: 30 mL via ORAL
  Filled 2016-12-25: qty 30

## 2016-12-25 MED ORDER — SENNOSIDES-DOCUSATE SODIUM 8.6-50 MG PO TABS
2.0000 | ORAL_TABLET | Freq: Two times a day (BID) | ORAL | Status: DC
  Administered 2016-12-25 – 2016-12-26 (×2): 2 via ORAL
  Filled 2016-12-25 (×2): qty 2

## 2016-12-25 NOTE — Progress Notes (Addendum)
PROGRESS NOTE  Kaelei Wheeler Faas MVH:[id number] DOB: 10-Aug-1930 DOA: 12/21/2016 PCP: Geoffry Paradise, MD  HPI/Recap of past 24 hours:  She refused suppository,  no bm, foley in place. Report pain is better, she is reluctant to participate with physical therapy   Assessment/Plan: Principal Problem:   Acute pyelonephritis Active Problems:   Essential hypertension   Hyponatremia   Thrombocytopenia (HCC)   GERD (gastroesophageal reflux disease)   Depression with anxiety   Acute respiratory failure with hypoxia (HCC)  UTI/urinary retention+/-Acute pyelonephritis:  --Ct ab /pel "Tract: Normal bilateral adrenal glands and kidneys. Extrarenal pelvis on the left. Distended urinary bladder without calculus or mass. " --ua + bacteria, wbc TNTC, rbc TNTC, large leukocytes,  --Post void residual 945, though patient denies feeling full, insert foley --Patient does not have fever or leukocytosis. Currently hemodynamically stable - blood culture obtained after abx given, urine culture + aerococcus, she is treated with rocephin since admission, change to augmentin on 6/1 ( I have discussed case  with ID). -urinary retention likely from recent opioids anlagesics for back pain, mri spine negative for cord compression   Hyponatremia: Na 124. Mental status normal. Most likely due to nausea, vomiting and decreased oral intake -received IVF -improving, d/c ivf repeat lab in am sodium 132, continue hold lasix  Back pain: and bilateral side pain, s/p recent kyphoplasty in 10/2016 Continue to c.o back pain, prn pain meds, PT, back brace ordered MRI lumbar spine + subacute T11 fracture,  IR consulted , s/p kyphoplasty on 6/1 ( no bending, no heavy lifting, no twisting 1-3 weeks after procedure, but patient should start physical therapy next day after procedure per communication with IR. )    Essential hypertension: -Continue amlodipine and losartan -Hold Lasix due to hyponatremia -IV hydralazine when  necessary  GERD: -Protonix  Depression and anxiety: no suicidal or homicidal ideations. -Continue home medications: Cymbalta, when necessary Xanax  Chronic Thrombocytopenia (HCC): platelet 94. No bleeding tendency. -Follow-up by CBC  constipation: stool softener, she refused suppository, no bm while on miralax/senakot, add mild of mag. Consider edema if still no bm   Hypothyroidism: tsh5, increase synthroid from to  DVT ppx: SCD Code Status: Full code Family Communication: patient and family at bedside Disposition Plan:  home health, hopefully on monday Consults called:  IR  Procedures:  Foley insertion  kyphoplasty on 6/1  Antibiotics:  Rocephin from admission to 6/1  augmentin from 6/1   Objective: BP (!) 135/53 (BP Location: Right Arm)   Pulse 66   Temp 98.7 F (37.1 C) (Oral)   Resp 18   Ht 5\' 5"  (1.651 m)   Wt 68.2 kg (150 lb 5.7 oz)   SpO2 92%   BMI 25.02 kg/m   Intake/Output Summary (Last 24 hours) at 12/25/16 1603 Last data filed at 12/25/16 1205  Gross per 24 hour  Intake              240 ml  Output             2875 ml  Net            -2635 ml   Filed Weights   12/21/16 1923 12/22/16 0207  Weight: 67.1 kg (148 lb) 68.2 kg (150 lb 5.7 oz)    Exam:   General:  Frail, chronically ill , foley in place  Cardiovascular: RRR  Respiratory: CTABL  Abdomen: Soft/ND/NT, positive BS  Musculoskeletal: No Edema  Neuro: aaox3  Data Reviewed: Basic Metabolic Panel:  Recent Labs Lab 12/21/16 2022 12/22/16 0604 12/23/16 0516 12/24/16 0508 12/25/16 0604  NA 124* 127* 132* 130* 132*  K 5.0 3.9 4.5 4.1 4.2  CL 90* 93* 97* 98* 96*  CO2 25 26 26 24 28   GLUCOSE 112* 101* 106* 99 96  BUN 15 11 15 12 13   CREATININE 0.65 0.46 0.55 0.49 0.53  CALCIUM 9.9 9.4 9.6 9.1 9.3  MG  --   --  2.0  --   --    Liver Function Tests:  Recent Labs Lab 12/21/16 2022  AST 37  ALT 23  ALKPHOS 103  BILITOT 0.5  PROT 7.7  ALBUMIN 4.6     No results for input(s): LIPASE, AMYLASE in the last 168 hours. No results for input(s): AMMONIA in the last 168 hours. CBC:  Recent Labs Lab 12/21/16 2022 12/22/16 0604 12/23/16 0516 12/24/16 0508  WBC 7.7 5.9 6.2 4.9  NEUTROABS  --   --  3.6 2.4  HGB 11.4* 11.4* 12.2 10.5*  HCT 33.1* 33.4* 36.4 31.2*  MCV 84.7 85.0 85.2 85.7  PLT 94* 85* 96* 72*   Cardiac Enzymes:   No results for input(s): CKTOTAL, CKMB, CKMBINDEX, TROPONINI in the last 168 hours. BNP (last 3 results) No results for input(s): BNP in the last 8760 hours.  ProBNP (last 3 results) No results for input(s): PROBNP in the last 8760 hours.  CBG: No results for input(s): GLUCAP in the last 168 hours.  Recent Results (from the past 240 hour(s))  Urine culture     Status: Abnormal   Collection Time: 12/21/16  9:05 PM  Result Value Ref Range Status   Specimen Description URINE, CATHETERIZED  Final   Special Requests NONE  Final   Culture (A)  Final    >=100,000 COLONIES/mL AEROCOCCUS URINAE Standardized susceptibility testing for this organism is not available. Performed at [facility], [address] [place], [place] [address]    Report Status 12/24/2016 FINAL  Final  MRSA PCR Screening     Status: None   Collection Time: 12/22/16  3:25 AM  Result Value Ref Range Status   MRSA by PCR NEGATIVE NEGATIVE Final    Comment:        The GeneXpert MRSA Assay (FDA approved for NASAL specimens only), is one component of a comprehensive MRSA colonization surveillance program. It is not intended to diagnose MRSA infection nor to guide or monitor treatment for MRSA infections.   Culture, blood (routine x 2)     Status: None (Preliminary result)   Collection Time: 12/22/16  5:22 PM  Result Value Ref Range Status   Specimen Description BLOOD LEFT HAND  Final   Special Requests   Final    BOTTLES DRAWN AEROBIC AND ANAEROBIC Blood Culture adequate volume   Culture   Final    NO GROWTH 2  DAYS Performed at [facility], [address] [place], [place] [address]    Report Status PENDING  Incomplete  Culture, blood (routine x 2)     Status: None (Preliminary result)   Collection Time: 12/22/16  5:34 PM  Result Value Ref Range Status   Specimen Description BLOOD LEFT HAND  Final   Special Requests IN PEDIATRIC BOTTLE Blood Culture adequate volume  Final   Culture   Final    NO GROWTH 2 DAYS Performed at [facility], [address] [place], [place] [address]    Report Status PENDING  Incomplete  Studies: No results found.  Scheduled Meds: . Abaloparatide  80 mcg Injection Daily  . amLODipine  10 mg Oral QHS  . amoxicillin-clavulanate  1 tablet Oral Q12H  . aspirin EC  81 mg Oral Daily  . bisacodyl  10 mg Rectal Daily  . calcium carbonate  1,250 mg Oral BID WC  . cholecalciferol  1,000 Units Oral Daily  . DULoxetine  20 mg Oral Daily  . levothyroxine  75 mcg Oral QAC breakfast  . lidocaine  1 patch Transdermal Q24H  . losartan  100 mg Oral Daily  . multivitamin with minerals  1 tablet Oral Daily  . omega-3 acid ethyl esters  1 g Oral Daily  . pantoprazole  40 mg Oral Daily  . polyethylene glycol  17 g Oral Daily  . senna-docusate  1 tablet Oral BID  . vitamin B-12  1,000 mcg Oral Daily    Continuous Infusions:    Time spent: >1825mins   Vonette Grosso MD, PhD  [facility] Pager [phone]. If 7PM-7AM, please contact night-coverage at [email or web address], password [facility] 12/25/2016, 4:03 PM  LOS: 2 days

## 2016-12-25 NOTE — Progress Notes (Signed)
Referring Physician(s): Xu,F  Supervising Physician: Julieanne Cotton  Patient Status:  [facility] - In-pt  Chief Complaint:  Back pain, thoracic compression fracture  Subjective: Pt walking in hallway with PT; states she feels better since KP yesterday   Allergies: Patient has no known allergies.  Medications: Prior to Admission medications   Medication Sig Start Date End Date Taking? Authorizing Provider  acetaminophen (TYLENOL) 325 MG tablet Take 650 mg by mouth every 6 (six) hours as needed for mild pain.    Yes [provider]  ALPRAZolam (XANAX) 0.25 MG tablet Take 0.25 mg by mouth at bedtime as needed for anxiety.  08/16/16  Yes [provider]  amLODipine (NORVASC) 10 MG tablet Take 1 tablet (10 mg total) by mouth daily. 05/08/12  Yes Rai, Ripudeep K, MD  aspirin EC 81 MG tablet Take 81 mg by mouth daily.   Yes [provider]  calcium carbonate (OS-CAL) 600 MG TABS Take 600 mg by mouth 2 (two) times daily with a meal.   Yes [provider]  cholecalciferol (VITAMIN D) 1000 UNITS tablet Take 1,000 Units by mouth daily.   Yes [provider]  DULoxetine HCl (CYMBALTA PO) Take 30 mg by mouth daily.    Yes [provider]  fish oil-omega-3 fatty acids 1000 MG capsule Take 1 g by mouth daily.   Yes [provider]  furosemide (LASIX) 20 MG tablet Take 20 mg by mouth daily as needed for fluid or edema.   Yes [provider]  levothyroxine (SYNTHROID, LEVOTHROID) 50 MCG tablet Take 50 mcg by mouth daily before breakfast.   Yes [provider]  losartan (COZAAR) 100 MG tablet TAKE 1 TABLET BY MOUTH  DAILY 05/27/16  Yes Marykay Lex, MD  MELATONIN PO Take 1 tablet by mouth at bedtime as needed (sleep).   Yes [provider]  methocarbamol (ROBAXIN) 500 MG tablet Take 1 tablet (500 mg total) by mouth every 8 (eight) hours as needed for muscle spasms. 10/22/16  Yes Rolly Salter, MD  Multiple  Vitamin (MULTIVITAMIN WITH MINERALS) TABS tablet Take 1 tablet by mouth daily.   Yes [provider]  omeprazole (PRILOSEC) 40 MG capsule Take 40 mg by mouth daily.   Yes [provider]  polyethylene glycol (MIRALAX / GLYCOLAX) packet Take 17 g by mouth daily. Patient taking differently: Take 17 g by mouth daily as needed.  10/23/16  Yes Rolly Salter, MD  traMADol (ULTRAM) 50 MG tablet Take 50 mg by mouth every 8 (eight) hours as needed for moderate pain or severe pain.   Yes [provider]  TYMLOS 3120 MCG/1.56ML SOPN Inject 80 mcg as directed daily.  12/04/16  Yes [provider]  vitamin B-12 (CYANOCOBALAMIN) 1000 MCG tablet Take 1,000 mcg by mouth daily.   Yes [provider]  HYDROcodone-acetaminophen (NORCO/VICODIN) 5-325 MG tablet Take 1 tablet by mouth every 6 (six) hours as needed for moderate pain. 10/22/16   Rolly Salter, MD     Vital Signs: BP (!) 147/44 (BP Location: Right Arm)   Pulse 70   Temp 98.1 F (36.7 C) (Oral)   Resp 16   Ht 5\' 5"  (1.651 m)   Wt 150 lb 5.7 oz (68.2 kg)   SpO2 98%   BMI 25.02 kg/m   Physical Exam puncture site mid back clean and dry, minimal tenderness, no sig hematoma, no new neuro changes; moving all fours ok  Imaging: Mr Lumbar Spine  Wo Contrast  Result Date: 12/23/2016 CLINICAL DATA:  Initial evaluation for severe back pain, evaluate for compression fracture. EXAM: MRI LUMBAR SPINE WITHOUT CONTRAST TECHNIQUE: Multiplanar, multisequence MR imaging of the lumbar spine was performed. No intravenous contrast was administered. COMPARISON:  Prior CT from 12/21/2016 as well as previous MRI from 10/21/2016. FINDINGS: Segmentation: Normal segmentation. Lowest well-formed disc is labeled the L5-S1 level. Same numbering system is employed as on previous exam. Alignment: Dextroscoliosis of the thoracolumbar spine. Vertebral bodies otherwise normally aligned with preservation of the normal lumbar lordosis.  Slight accentuation of the lower thoracic kyphosis due to T9 compression deformity. Vertebrae: Linear T1 hypointense, mildly T2/STIR hyperintense signal intensity extends through the superior endplate of T11, consistent with compression fracture, favored to be subacute in nature. Mild 25% central height loss without bony retropulsion. No other acute compression fracture identified. Chronic compression deformities involving the T9, L1, and L2 vertebral bodies with sequelae of prior vertebral augmentation. There is a focus of abnormal edema within the partially visualized lower or left sacral ala just medial to the SI joint (series 6, image 15). Suggestion of linear T1 hypointensity through this region (series 7, image 48). Finding suspicious for possible insufficiency fracture. Signal intensity within the vertebral body bone marrow otherwise within normal limits. Reactive endplate changes noted about the L4-5 interspace. No worrisome osseous lesions. Conus medullaris: Extends to the L1 level and appears normal. Paraspinal and other soft tissues: Paraspinous soft tissues demonstrate no acute abnormality. Fatty atrophy noted within the lower paraspinous musculature. Ectatic infrarenal aorta measures up to 2.5 cm. Approximate 13 mm cystic lesion at the pancreatic tail again noted, indeterminate. Disc levels: T8-9:  Degenerative disc bulge.  No stenosis. T9-10: Posterior element hypertrophy with mild and bilateral foraminal stenosis. T10-11:  Mild posterior element hypertrophy without stenosis. T11-12:  Unremarkable. T12-L1: Mild facet hypertrophy. 3 mm bony retropulsion related to the L1 compression fracture. No stenosis. L1-2: 3-4 mm bony retropulsion related to the L2 compression fracture. Mild facet hypertrophy. No stenosis. L2-3: Broad right paracentral disc protrusion extending into the right neural foramen. No significant canal or foraminal stenosis. No evidence for impingement. L3-4: Degenerative intervertebral  disc space narrowing with diffuse disc bulge. Right worse than left facet degeneration. No significant canal or foraminal stenosis. L4-5: Diffuse degenerative intervertebral disc space narrowing with disc bulge. Right far lateral/extraforaminal endplate spurring. Superimposed small central disc protrusion with resultant mild canal stenosis. Moderate to advanced right L4 foraminal narrowing relatively stable. L5-S1: Bilateral facet arthropathy. Superimposed 5 mm synovial cyst at the medial aspect of the right L5-S1 facet, stable. No significant stenosis. IMPRESSION: 1. Subacute compression fracture involving the superior endplate of T11 with mild 25% height loss without significant bony retropulsion. 2. Abnormal edema within the lateral and inferior left sacral ala, suspicious for possible insufficiency fracture. 3. Chronic compression fractures involving the T9, L1, and L2 vertebral bodies with sequelae of prior vertebral augmentation. 4. Stable multilevel degenerative changes involving the visible thoracic and lumbar spine. Similar right L4 foraminal impingement. 5. Stable 13 mm cystic structure at the pancreatic tail, indeterminate. Again, 1 year follow-up MRI suggested to document stability. Electronically Signed   By: Rise MuBenjamin  McClintock M.D.   On: 12/23/2016 14:37   Ct Abdomen Pelvis W Contrast  Result Date: 12/21/2016 CLINICAL DATA:  Bilateral flank pain starting last night with vomiting x1. EXAM: CT ABDOMEN AND PELVIS WITH CONTRAST TECHNIQUE: Multidetector CT imaging of the abdomen and pelvis was performed using the standard protocol following bolus administration of intravenous  contrast. CONTRAST:  ISOVUE-300 IOPAMIDOL (ISOVUE-300) INJECTION 61% COMPARISON:  05/26/2010 CT FINDINGS: Lower chest: Normal visualized cardiac chambers. No pericardial effusion. Coronary arteriosclerosis is noted the circumflex. Hepatobiliary: Homogeneous attenuation of the liver without biliary dilatation. Moderate  gallbladder distention with portions of the gallbladder interposed between the liver and ventral abdominal wall. No wall thickening or pericholecystic fluid. No gallstones are noted. Pancreas: No focal pancreatic mass. Mild ductal ectasia to 4 mm. No pancreatic inflammation. Spleen: No splenic mass or enlargement. Adrenals/Urinary Tract: Normal bilateral adrenal glands and kidneys. Extrarenal pelvis on the left. Distended urinary bladder without calculus or mass. Stomach/Bowel: Contrast distention of the stomach. Normal small bowel rotation. Increased colonic stool burden consistent with constipation. No bowel obstruction or inflammation. Normal appendix. Vascular/Lymphatic: Aortoiliac atherosclerosis without aneurysmal dilatation. The abdominal aorta is ectatic in appearance. Reproductive: Normal uterus.  No adnexal mass. Other: No abdominal wall hernia or abnormality. No abdominopelvic ascites. Musculoskeletal: L1 and L2 vertebral augmentation for compression fractures. Dextroscoliosis the lumbar spine. No acute nor suspicious osseous abnormalities. IMPRESSION: 1. Coronary arteriosclerosis and aortic atherosclerosis. 2. Moderate gallbladder distention without secondary signs of acute cholecystitis. There is interposition of a portion of the gallbladder between the liver and ventral abdominal wall which might contribute pain with compression in the right upper quadrant. 3. No nephrolithiasis, renal mass or obstructive uropathy, however the urinary bladder is moderately distended without source of obstruction noted. If the patient is unable to void, Foley decompression may prove useful. 4. Moderate colonic stool burden consistent with constipation. No bowel inflammation. 5. L1 and L2 vertebral body augmentation secondary to compression fractures. Dextroscoliosis of the lumbar spine. Electronically Signed   By: Tollie Eth M.D.   On: 12/21/2016 22:28    Labs:  CBC:  Recent Labs  12/21/16 2022 12/22/16 0604  12/23/16 0516 12/24/16 0508  WBC 7.7 5.9 6.2 4.9  HGB 11.4* 11.4* 12.2 10.5*  HCT 33.1* 33.4* 36.4 31.2*  PLT 94* 85* 96* 72*    COAGS:  Recent Labs  10/28/16 0854 12/24/16 0508  INR 0.98 0.92  APTT 34  --     BMP:  Recent Labs  12/22/16 0604 12/23/16 0516 12/24/16 0508 12/25/16 0604  NA 127* 132* 130* 132*  K 3.9 4.5 4.1 4.2  CL 93* 97* 98* 96*  CO2 26 26 24 28   GLUCOSE 101* 106* 99 96  BUN 11 15 12 13   CALCIUM 9.4 9.6 9.1 9.3  CREATININE 0.46 0.55 0.49 0.53  GFRNONAA >60 >60 >60 >60  GFRAA >60 >60 >60 >60    LIVER FUNCTION TESTS:  Recent Labs  09/06/16 0234 12/21/16 2022  BILITOT 0.7 0.5  AST 45* 37  ALT 29 23  ALKPHOS 68 103  PROT 7.6 7.7  ALBUMIN 4.4 4.6    Assessment and Plan: Back pain, s/p T11 KP 6/1; stable with improved sx's; cont with PT/OT; f/u with Dr. Corliss Skains in clinic in 2 weeks   Electronically Signed: D. Jeananne Rama, PA-C 12/25/2016, 11:09 AM   I spent a total of 15 minutes at the the patient's bedside AND on the patient's hospital floor or unit, greater than 50% of which was counseling/coordinating care for T11 kyphoplasty    Patient ID: [name] [name], female   DOB: [birth year], 83 y.o.   MRN: [id number]

## 2016-12-25 NOTE — Progress Notes (Signed)
Physical Therapy Treatment Patient Details Name: [name] CombesBetty J [name] MRN: [id number] DOB: 08/29/1930 Today's Date: 12/25/2016    History of Present Illness 83 y.o. female admitted with acute pyelonephritis. history of significant of chronic back pain-s/p kyphoplasties, hypertension, hyperlipidemia, GERD, hypothyroidism, depression, slightly, anemia, GI bleeding, thrombocytopenia, vertigo, who presents with urinary frequency, burning on urination, nausea, vomiting and bilateral flank pain. Pt with T11 Kysphoplasty on 12/24/2016.     PT Comments    Pt tolerated session well. Was anticipating to have pain , however took great length to educate family (pt's son and daughter in law) with proper bed mobility, back precautions, safety with all mobility, progressing, and ambulation. Pt and family still have great questions about performing ADLS when she gets home to prevent further compression fractures from forward flexion. Recommend HHPT and HHOT to follow at the home to continue with mobility, safety training, education, and ADL training. Will continue to follow while here on acute care .     Follow Up Recommendations  Home health PT (fmaily and patient really would like to go home and will provide 24/7 care intially as long as they need to. ALSO recommend HHOT for pt has a lot of questions on how to perform ADLS and prevent the forward flexion to minize further compression fractures.)     Equipment Recommendations  None recommended by PT    Recommendations for Other Services       Precautions / Restrictions Precautions Precautions: Fall Required Braces or Orthoses:  (pt has back brace delievered however MD stated she can wear it PRN.Marland Kitchen. if she likes , but doesn't have to wear it. ) Restrictions Weight Bearing Restrictions: No    Mobility  Bed Mobility Overal bed mobility: Needs Assistance Bed Mobility: Rolling;Sidelying to Sit Rolling: Min assist Sidelying to sit: Min assist;HOB elevated        General bed mobility comments: Increased time and education for proper bed mobility and log roll. Pt performed log roll technique., and for sidelying to sit. Cues for hand placement.   Transfers Overall transfer level: Needs assistance Equipment used: Rolling walker (2 wheeled) Transfers: Sit to/from Stand Sit to Stand: From elevated surface;Min assist Stand pivot transfers: Min assist       General transfer comment: Assist to rise, stabilize, control descent. VCs safety, technique, hand placement.   Ambulation/Gait Ambulation/Gait assistance: Min assist Ambulation Distance (Feet): 85 Feet Assistive device: Rolling walker (2 wheeled) Gait Pattern/deviations: Step-through pattern     General Gait Details: slow steady gait, pt trilled with not having pain and upright, but guarded, and did fatigue after some time standing talking to the MD at end of her walk.    Stairs            Wheelchair Mobility    Modified Rankin (Stroke Patients Only)       Balance                                            Cognition Arousal/Alertness: Awake/alert Behavior During Therapy: WFL for tasks assessed/performed Overall Cognitive Status: Within Functional Limits for tasks assessed                                        Exercises      General Comments  Pertinent Vitals/Pain Pain Assessment: No/denies pain Pain Score: 0-No pain (continued to montior her pain with all mobility, and she stated it was not hurting.)    Home Living                      Prior Function            PT Goals (current goals can now be found in the care plan section) Acute Rehab PT Goals Patient Stated Goal: back to apartment PT Goal Formulation: With patient Time For Goal Achievement: 01/05/17 Potential to Achieve Goals: Good Progress towards PT goals: Progressing toward goals    Frequency    Min 3X/week      PT Plan Discharge plan  needs to be updated    Co-evaluation              AM-PAC PT "6 Clicks" Daily Activity  Outcome Measure  Difficulty turning over in bed (including adjusting bedclothes, sheets and blankets)?: A Little Difficulty moving from lying on back to sitting on the side of the bed? : A Little Difficulty sitting down on and standing up from a chair with arms (e.g., wheelchair, bedside commode, etc,.)?: A Little Help needed moving to and from a bed to chair (including a wheelchair)?: A Little Help needed walking in hospital room?: A Little Help needed climbing 3-5 steps with a railing? : A Little 6 Click Score: 18    End of Session Equipment Utilized During Treatment: Gait belt Activity Tolerance: Patient tolerated treatment well Patient left: with call bell/phone within reach (pt wanted to stay in bathroom trying ot have a BM. Nurse and CNA alerted and went in after about 1 minute of me exiting to assist patietn and family . ) Nurse Communication: Mobility status PT Visit Diagnosis: Muscle weakness (generalized) (M62.81);Difficulty in walking, not elsewhere classified (R26.2)     Time: 1030-1130 PT Time Calculation (min) (ACUTE ONLY): 60 min  Charges:  $Gait Training: 8-22 mins $Therapeutic Activity: 23-37 mins $Self Care/Home Management: 8-22                    G Codes:  Functional Assessment Tool Used: AM-PAC 6 Clicks Basic Mobility;Clinical judgement Functional Limitation: Mobility: Walking and moving around Mobility: Walking and Moving Around Current Status (N8295): At least 20 percent but less than 40 percent impaired, limited or restricted Mobility: Walking and Moving Around Goal Status [id number]): At least 1 percent but less than 20 percent impaired, limited or restricted    Marella Bile, PT Pager: [phone] 12/25/2016    Adnan Vanvoorhis, Clois Dupes 12/25/2016, 12:24 PM

## 2016-12-26 LAB — BASIC METABOLIC PANEL
ANION GAP: 8 (ref 5–15)
BUN: 18 mg/dL (ref 6–20)
CALCIUM: 9.3 mg/dL (ref 8.9–10.3)
CO2: 28 mmol/L (ref 22–32)
Chloride: 95 mmol/L — ABNORMAL LOW (ref 101–111)
Creatinine, Ser: 0.51 mg/dL (ref 0.44–1.00)
GLUCOSE: 100 mg/dL — AB (ref 65–99)
POTASSIUM: 4.3 mmol/L (ref 3.5–5.1)
Sodium: 131 mmol/L — ABNORMAL LOW (ref 135–145)

## 2016-12-26 LAB — CBC WITH DIFFERENTIAL/PLATELET
BASOS ABS: 0 10*3/uL (ref 0.0–0.1)
BASOS PCT: 0 %
Eosinophils Absolute: 0 10*3/uL (ref 0.0–0.7)
Eosinophils Relative: 0 %
HEMATOCRIT: 30.3 % — AB (ref 36.0–46.0)
Hemoglobin: 10.1 g/dL — ABNORMAL LOW (ref 12.0–15.0)
LYMPHS PCT: 20 %
Lymphs Abs: 0.6 10*3/uL — ABNORMAL LOW (ref 0.7–4.0)
MCH: 28.3 pg (ref 26.0–34.0)
MCHC: 33.3 g/dL (ref 30.0–36.0)
MCV: 84.9 fL (ref 78.0–100.0)
Monocytes Absolute: 1 10*3/uL (ref 0.1–1.0)
Monocytes Relative: 32 %
NEUTROS ABS: 1.4 10*3/uL — AB (ref 1.7–7.7)
NEUTROS PCT: 47 %
Platelets: 77 10*3/uL — ABNORMAL LOW (ref 150–400)
RBC: 3.57 MIL/uL — AB (ref 3.87–5.11)
RDW: 17.1 % — AB (ref 11.5–15.5)
WBC: 3 10*3/uL — AB (ref 4.0–10.5)

## 2016-12-26 LAB — MAGNESIUM: Magnesium: 2.1 mg/dL (ref 1.7–2.4)

## 2016-12-26 MED ORDER — MINERAL OIL RE ENEM
1.0000 | ENEMA | Freq: Once | RECTAL | Status: DC
  Filled 2016-12-26: qty 1

## 2016-12-26 MED ORDER — ENSURE ENLIVE PO LIQD: 237.0000 mL | Freq: Every day | ORAL | 12 refills | Status: DC | PRN

## 2016-12-26 MED ORDER — POLYETHYLENE GLYCOL 3350 17 G PO PACK: 17.0000 g | PACK | Freq: Every day | ORAL | 0 refills | Status: DC

## 2016-12-26 MED ORDER — LEVOTHYROXINE SODIUM 75 MCG PO TABS: 75.0000 ug | ORAL_TABLET | Freq: Every day | ORAL | 0 refills | Status: DC

## 2016-12-26 MED ORDER — BISACODYL 10 MG RE SUPP: 10.0000 mg | Freq: Every day | RECTAL | Status: DC

## 2016-12-26 MED ORDER — SENNOSIDES-DOCUSATE SODIUM 8.6-50 MG PO TABS: 2.0000 | ORAL_TABLET | Freq: Every day | ORAL | 0 refills | Status: DC

## 2016-12-26 NOTE — Discharge Summary (Signed)
Discharge Summary  [name] [name] UJW:[id number] DOB: 06/23/1931  PCP: Geoffry Paradise, MD  Admit date: 12/21/2016 Discharge date: 12/26/2016  Time spent: >40mins, more than 50% time spent on coordination of care, patient and family counseling.  Recommendations for Outpatient Follow-up:  1. F/u with PMD within a week  for hospital discharge follow up, repeat cbc/bmp at follow up 2. F/u with IR 3. Patient is discharged home with home health  Discharge Diagnoses:  Active Hospital Problems   Diagnosis Date Noted  . Acute pyelonephritis 12/21/2016  . GERD (gastroesophageal reflux disease) 12/22/2016  . Depression with anxiety 12/22/2016  . Acute respiratory failure with hypoxia (HCC) 12/22/2016  . Thrombocytopenia (HCC) 10/21/2016  . Hyponatremia 10/21/2016  . Essential hypertension 01/29/2013    Resolved Hospital Problems   Diagnosis Date Noted Date Resolved  No resolved problems to display.    Discharge Condition: stable  Diet recommendation: heart healthy  Filed Weights   12/21/16 1923 12/22/16 0207  Weight: 67.1 kg (148 lb) 68.2 kg (150 lb 5.7 oz)    History of present illness:  PCP: Geoffry Paradise, MD   Patient coming from:  The patient is coming from home.  At baseline, pt is independent for most of ADL.      Chief Complaint: Urinary frequency, burning on urination, nausea, vomiting, bilateral flank pain  HPI: [name] [name] is a 83 y.o. female with medical history significant of chronic back pain, hypertension, hyperlipidemia, GERD, hypothyroidism, depression, slightly, anemia, GI bleeding, thrombocytopenia, vertigo, who presents with urinary frequency, burning on urination, nausea, vomiting and bilateral flank pain.  Patient states that her symptoms started yesterday. She has increased urinary frequency, mild burning on urination, but no dysuria. She also has nausea and vomited once without blood in the vomitus. She reports bilateral flank pain, which is constant,  9 out of 10 in severity, nonradiating. aggravated by movement. Patient denies chest pain, SOB, cough, fever or chills. No abdominal pain or diarrhea. She is constipated. Patient does not have unilateral weakness.  ED Course: pt was found to have positive urinalysis with large amount of leukocyte, WBC 7.7, pending lactic acid level, creatinine normal, temperature normal, no tachycardia, oxygen saturation 88% on room air, which improved to 92% on 1 L nasal cannula oxygen. CT-abd/pelvis findings are not impressive. Patient is placed on MedSurg bed for observation.  Hospital Course:  Principal Problem:   Acute pyelonephritis Active Problems:   Essential hypertension   Hyponatremia   Thrombocytopenia (HCC)   GERD (gastroesophageal reflux disease)   Depression with anxiety   Acute respiratory failure with hypoxia (HCC)   UTI/urinary retention+/-Acute pyelonephritis:  --Ct ab /pel "Tract: Normal bilateral adrenal glands and kidneys. Extrarenal pelvis on the left. Distended urinary bladder without calculus or mass. " --ua + bacteria, wbc TNTC, rbc TNTC, large leukocytes,  --Post void residual 945, though patient denies feeling full, insert foley --Patient does not have fever or leukocytosis.  hemodynamically stable - blood culture no growth, urine culture + aerococcus, she is treated with rocephin since admission, change to augmentin on 6/1 ( I have discussed case  with ID). She finished abx treatment in the hospital -urinary retention likely from recent opioids anlagesics for back pain, mri spine negative for cord compression, foley removed, able to void prior to discharge.   Hyponatremia:Na 124. Mental status normal. Most likely due to nausea, vomiting and decreased oral intake -received IVF -improving, lasix held in the hospital  Back pain: and bilateral side pain, s/p recent kyphoplasty in  10/2016 MRI lumbar spine + subacute T11 fracture,   IR consulted , s/p kyphoplasty on 6/1 ( no  bending, no heavy lifting, no twisting 1-3 weeks after procedure, but patient should start physical therapy next day after procedure per communication with IR. )    Essential hypertension: -Continue amlodipine and losartan -Hold Lasix due to hyponatremia   GERD: -Protonix  Depression and anxiety:no suicidal or homicidal ideations. -Continue home medications: Cymbalta, when necessary Xanax  Chronic Thrombocytopenia (HCC):   plt at baseline, No bleeding tendency.   constipation: stool softener, she refused suppository, no bm while on miralax/senakot, add mild of mag.   Hypothyroidism: tsh5, increase synthroid from 50mcg to 75mcg  DVT ppx: SCD Code Status:Full code Family Communication: patient and family at bedside Disposition Plan: home health, hopefully on monday Consults called:IR  Procedures:  Foley insertion  kyphoplasty on 6/1  Antibiotics:  Rocephin from admission to 6/1  augmentin from 6/1   Discharge Exam: BP (!) 148/54 (BP Location: Right Arm)   Pulse 61   Temp 98.7 F (37.1 C) (Oral)   Resp 16   Ht 5\' 5"  (1.651 m)   Wt 68.2 kg (150 lb 5.7 oz)   SpO2 94%   BMI 25.02 kg/m    General:  Frail, chronically ill   Cardiovascular: RRR  Respiratory: CTABL  Abdomen: Soft/ND/NT, positive BS  Musculoskeletal: No Edema  Neuro: aaox3    Discharge Instructions You were cared for by a hospitalist during your hospital stay. If you have any questions about your discharge medications or the care you received while you were in the hospital after you are discharged, you can call the unit and asked to speak with the hospitalist on call if the hospitalist that took care of you is not available. Once you are discharged, your primary care physician will handle any further medical issues. Please note that NO REFILLS for any discharge medications will be authorized once you are discharged, as it is imperative that you return to your primary care  physician (or establish a relationship with a primary care physician if you do not have one) for your aftercare needs so that they can reassess your need for medications and monitor your lab values.  Discharge Instructions    Diet general    Complete by:  As directed    Face-to-face encounter (required for Medicare/Medicaid patients)    Complete by:  As directed    I Gerome Kokesh certify that this patient is under my care and that I, or a nurse practitioner or physician's assistant working with me, had a face-to-face encounter that meets the physician face-to-face encounter requirements with this patient on 12/25/2016. The encounter with the patient was in whole, or in part for the following medical condition(s) which is the primary reason for home health care (List medical condition): FTT   The encounter with the patient was in whole, or in part, for the following medical condition, which is the primary reason for home health care:  FTT   I certify that, based on my findings, the following services are medically necessary home health services:  Physical therapy   Reason for Medically Necessary Home Health Services:  Skilled Nursing- Change/Decline in Patient Status   My clinical findings support the need for the above services:  Pain interferes with ambulation/mobility   Further, I certify that my clinical findings support that this patient is homebound due to:  Pain interferes with ambulation/mobility   Home Health    Complete by:  As directed    To provide the following care/treatments:   PT OT RN     Increase activity slowly    Complete by:  As directed      Allergies as of 12/26/2016   No Known Allergies     Medication List    TAKE these medications   acetaminophen 325 MG tablet Commonly known as:  TYLENOL Take 650 mg by mouth every 6 (six) hours as needed for mild pain.   ALPRAZolam 0.25 MG tablet Commonly known as:  XANAX Take 0.25 mg by mouth at bedtime as needed for anxiety.     amLODipine 10 MG tablet Commonly known as:  NORVASC Take 1 tablet (10 mg total) by mouth daily.   aspirin EC 81 MG tablet Take 81 mg by mouth daily.   calcium carbonate 600 MG Tabs tablet Commonly known as:  OS-CAL Take 600 mg by mouth 2 (two) times daily with a meal.   cholecalciferol 1000 units tablet Commonly known as:  VITAMIN D Take 1,000 Units by mouth daily.   CYMBALTA PO Take 30 mg by mouth daily.   feeding supplement (ENSURE ENLIVE) Liqd Take 237 mLs by mouth daily as needed (If pt/family requests or if intakes of meals are poor).   fish oil-omega-3 fatty acids 1000 MG capsule Take 1 g by mouth daily.   furosemide 20 MG tablet Commonly known as:  LASIX Take 20 mg by mouth daily as needed for fluid or edema.   HYDROcodone-acetaminophen 5-325 MG tablet Commonly known as:  NORCO/VICODIN Take 1 tablet by mouth every 6 (six) hours as needed for moderate pain.   levothyroxine 75 MCG tablet Commonly known as:  SYNTHROID, LEVOTHROID Take 1 tablet (75 mcg total) by mouth daily before breakfast. Start taking on:  12/27/2016 What changed:  medication strength  how much to take   losartan 100 MG tablet Commonly known as:  COZAAR TAKE 1 TABLET BY MOUTH  DAILY   MELATONIN PO Take 1 tablet by mouth at bedtime as needed (sleep).   methocarbamol 500 MG tablet Commonly known as:  ROBAXIN Take 1 tablet (500 mg total) by mouth every 8 (eight) hours as needed for muscle spasms.   multivitamin with minerals Tabs tablet Take 1 tablet by mouth daily.   omeprazole 40 MG capsule Commonly known as:  PRILOSEC Take 40 mg by mouth daily.   polyethylene glycol packet Commonly known as:  MIRALAX / GLYCOLAX Take 17 g by mouth daily. What changed:  when to take this  reasons to take this   senna-docusate 8.6-50 MG tablet Commonly known as:  Senokot-S Take 2 tablets by mouth at bedtime.   traMADol 50 MG tablet Commonly known as:  ULTRAM Take 50 mg by mouth every 8  (eight) hours as needed for moderate pain or severe pain.   TYMLOS 3120 MCG/1.56ML Sopn Generic drug:  Abaloparatide Inject 80 mcg as directed daily.   vitamin B-12 1000 MCG tablet Commonly known as:  CYANOCOBALAMIN Take 1,000 mcg by mouth daily.      No Known Allergies Follow-up Information    Geoffry Paradise, MD Follow up in 1 week(s).   Specialty:  Internal Medicine Why:  hospital discharge follow up, repeat cbc/bmp at follow up. pmd to repeat thyroid function (TSH) in 4-6 weeks, Contact information: [address] [place] [address] [phone]        Julieanne Cotton, MD Follow up in 2 week(s).   Specialty:  Interventional Radiology Why:  post procedure follow  up Contact information: [address] [place] [address] [phone]        [facility] Follow up.   Specialty:  Home Health Services Why:  Home Health Physical Therapy, Occupational Therapy and RN Contact information: [address] [place] [address] [phone]            The results of significant diagnostics from this hospitalization (including imaging, microbiology, ancillary and laboratory) are listed below for reference.    Significant Diagnostic Studies: Mr Lumbar Spine Wo Contrast  Result Date: 12/23/2016 CLINICAL DATA:  Initial evaluation for severe back pain, evaluate for compression fracture. EXAM: MRI LUMBAR SPINE WITHOUT CONTRAST TECHNIQUE: Multiplanar, multisequence MR imaging of the lumbar spine was performed. No intravenous contrast was administered. COMPARISON:  Prior CT from 12/21/2016 as well as previous MRI from 10/21/2016. FINDINGS: Segmentation: Normal segmentation. Lowest well-formed disc is labeled the L5-S1 level. Same numbering system is employed as on previous exam. Alignment: Dextroscoliosis of the thoracolumbar spine. Vertebral bodies otherwise normally aligned with preservation of the normal lumbar lordosis. Slight  accentuation of the lower thoracic kyphosis due to T9 compression deformity. Vertebrae: Linear T1 hypointense, mildly T2/STIR hyperintense signal intensity extends through the superior endplate of T11, consistent with compression fracture, favored to be subacute in nature. Mild 25% central height loss without bony retropulsion. No other acute compression fracture identified. Chronic compression deformities involving the T9, L1, and L2 vertebral bodies with sequelae of prior vertebral augmentation. There is a focus of abnormal edema within the partially visualized lower or left sacral ala just medial to the SI joint (series 6, image 15). Suggestion of linear T1 hypointensity through this region (series 7, image 48). Finding suspicious for possible insufficiency fracture. Signal intensity within the vertebral body bone marrow otherwise within normal limits. Reactive endplate changes noted about the L4-5 interspace. No worrisome osseous lesions. Conus medullaris: Extends to the L1 level and appears normal. Paraspinal and other soft tissues: Paraspinous soft tissues demonstrate no acute abnormality. Fatty atrophy noted within the lower paraspinous musculature. Ectatic infrarenal aorta measures up to 2.5 cm. Approximate 13 mm cystic lesion at the pancreatic tail again noted, indeterminate. Disc levels: T8-9:  Degenerative disc bulge.  No stenosis. T9-10: Posterior element hypertrophy with mild and bilateral foraminal stenosis. T10-11:  Mild posterior element hypertrophy without stenosis. T11-12:  Unremarkable. T12-L1: Mild facet hypertrophy. 3 mm bony retropulsion related to the L1 compression fracture. No stenosis. L1-2: 3-4 mm bony retropulsion related to the L2 compression fracture. Mild facet hypertrophy. No stenosis. L2-3: Broad right paracentral disc protrusion extending into the right neural foramen. No significant canal or foraminal stenosis. No evidence for impingement. L3-4: Degenerative intervertebral disc  space narrowing with diffuse disc bulge. Right worse than left facet degeneration. No significant canal or foraminal stenosis. L4-5: Diffuse degenerative intervertebral disc space narrowing with disc bulge. Right far lateral/extraforaminal endplate spurring. Superimposed small central disc protrusion with resultant mild canal stenosis. Moderate to advanced right L4 foraminal narrowing relatively stable. L5-S1: Bilateral facet arthropathy. Superimposed 5 mm synovial cyst at the medial aspect of the right L5-S1 facet, stable. No significant stenosis. IMPRESSION: 1. Subacute compression fracture involving the superior endplate of T11 with mild 25% height loss without significant bony retropulsion. 2. Abnormal edema within the lateral and inferior left sacral ala, suspicious for possible insufficiency fracture. 3. Chronic compression fractures involving the T9, L1, and L2 vertebral bodies with sequelae of prior vertebral augmentation. 4. Stable multilevel degenerative changes involving the visible  thoracic and lumbar spine. Similar right L4 foraminal impingement. 5. Stable 13 mm cystic structure at the pancreatic tail, indeterminate. Again, 1 year follow-up MRI suggested to document stability. Electronically Signed   By: Rise Mu M.D.   On: 12/23/2016 14:37   Ct Abdomen Pelvis W Contrast  Result Date: 12/21/2016 CLINICAL DATA:  Bilateral flank pain starting last night with vomiting x1. EXAM: CT ABDOMEN AND PELVIS WITH CONTRAST TECHNIQUE: Multidetector CT imaging of the abdomen and pelvis was performed using the standard protocol following bolus administration of intravenous contrast. CONTRAST:  ISOVUE-300 IOPAMIDOL (ISOVUE-300) INJECTION 61% COMPARISON:  05/26/2010 CT FINDINGS: Lower chest: Normal visualized cardiac chambers. No pericardial effusion. Coronary arteriosclerosis is noted the circumflex. Hepatobiliary: Homogeneous attenuation of the liver without biliary dilatation. Moderate  gallbladder distention with portions of the gallbladder interposed between the liver and ventral abdominal wall. No wall thickening or pericholecystic fluid. No gallstones are noted. Pancreas: No focal pancreatic mass. Mild ductal ectasia to 4 mm. No pancreatic inflammation. Spleen: No splenic mass or enlargement. Adrenals/Urinary Tract: Normal bilateral adrenal glands and kidneys. Extrarenal pelvis on the left. Distended urinary bladder without calculus or mass. Stomach/Bowel: Contrast distention of the stomach. Normal small bowel rotation. Increased colonic stool burden consistent with constipation. No bowel obstruction or inflammation. Normal appendix. Vascular/Lymphatic: Aortoiliac atherosclerosis without aneurysmal dilatation. The abdominal aorta is ectatic in appearance. Reproductive: Normal uterus.  No adnexal mass. Other: No abdominal wall hernia or abnormality. No abdominopelvic ascites. Musculoskeletal: L1 and L2 vertebral augmentation for compression fractures. Dextroscoliosis the lumbar spine. No acute nor suspicious osseous abnormalities. IMPRESSION: 1. Coronary arteriosclerosis and aortic atherosclerosis. 2. Moderate gallbladder distention without secondary signs of acute cholecystitis. There is interposition of a portion of the gallbladder between the liver and ventral abdominal wall which might contribute pain with compression in the right upper quadrant. 3. No nephrolithiasis, renal mass or obstructive uropathy, however the urinary bladder is moderately distended without source of obstruction noted. If the patient is unable to void, Foley decompression may prove useful. 4. Moderate colonic stool burden consistent with constipation. No bowel inflammation. 5. L1 and L2 vertebral body augmentation secondary to compression fractures. Dextroscoliosis of the lumbar spine. Electronically Signed   By: Tollie Eth M.D.   On: 12/21/2016 22:28    Microbiology: Recent Results (from the past 240 hour(s))    Urine culture     Status: Abnormal   Collection Time: 12/21/16  9:05 PM  Result Value Ref Range Status   Specimen Description URINE, CATHETERIZED  Final   Special Requests NONE  Final   Culture (A)  Final    >=100,000 COLONIES/mL AEROCOCCUS URINAE Standardized susceptibility testing for this organism is not available. Performed at [facility], [address], [place], [place] [address]    Report Status 12/24/2016 FINAL  Final  MRSA PCR Screening     Status: None   Collection Time: 12/22/16  3:25 AM  Result Value Ref Range Status   MRSA by PCR NEGATIVE NEGATIVE Final    Comment:        The GeneXpert MRSA Assay (FDA approved for NASAL specimens only), is one component of a comprehensive MRSA colonization surveillance program. It is not intended to diagnose MRSA infection nor to guide or monitor treatment for MRSA infections.   Culture, blood (routine x 2)     Status: None (Preliminary result)   Collection Time: 12/22/16  5:22 PM  Result Value Ref Range Status   Specimen Description BLOOD LEFT HAND  Final   Special Requests   Final    BOTTLES DRAWN AEROBIC AND ANAEROBIC Blood Culture adequate volume   Culture   Final    NO GROWTH 4 DAYS Performed at [facility], [address] [place], [place] [address]    Report Status PENDING  Incomplete  Culture, blood (routine x 2)     Status: None (Preliminary result)   Collection Time: 12/22/16  5:34 PM  Result Value Ref Range Status   Specimen Description BLOOD LEFT HAND  Final   Special Requests IN PEDIATRIC BOTTLE Blood Culture adequate volume  Final   Culture   Final    NO GROWTH 4 DAYS Performed at [facility], [address], [place], [place] [address]    Report Status PENDING  Incomplete     Labs: Basic Metabolic Panel:  Recent Labs Lab 12/22/16 0604 12/23/16 0516 12/24/16 0508 12/25/16 0604 12/26/16 0513  NA 127* 132* 130* 132* 131*  K 3.9 4.5 4.1 4.2 4.3  CL 93* 97* 98* 96* 95*  CO2 26  26 24 28 28   GLUCOSE 101* 106* 99 96 100*  BUN 11 15 12 13 18   CREATININE 0.46 0.55 0.49 0.53 0.51  CALCIUM 9.4 9.6 9.1 9.3 9.3  MG  --  2.0  --   --  2.1   Liver Function Tests:  Recent Labs Lab 12/21/16 2022  AST 37  ALT 23  ALKPHOS 103  BILITOT 0.5  PROT 7.7  ALBUMIN 4.6   No results for input(s): LIPASE, AMYLASE in the last 168 hours. No results for input(s): AMMONIA in the last 168 hours. CBC:  Recent Labs Lab 12/21/16 2022 12/22/16 0604 12/23/16 0516 12/24/16 0508 12/26/16 0513  WBC 7.7 5.9 6.2 4.9 3.0*  NEUTROABS  --   --  3.6 2.4 1.4*  HGB 11.4* 11.4* 12.2 10.5* 10.1*  HCT 33.1* 33.4* 36.4 31.2* 30.3*  MCV 84.7 85.0 85.2 85.7 84.9  PLT 94* 85* 96* 72* 77*   Cardiac Enzymes: No results for input(s): CKTOTAL, CKMB, CKMBINDEX, TROPONINI in the last 168 hours. BNP: BNP (last 3 results) No results for input(s): BNP in the last 8760 hours.  ProBNP (last 3 results) No results for input(s): PROBNP in the last 8760 hours.  CBG: No results for input(s): GLUCAP in the last 168 hours.     SignedAlbertine Grates MD, PhD  [facility] 12/26/2016, 6:27 PM

## 2016-12-26 NOTE — Care Management Important Message (Signed)
Important Message  Patient Details  Name: Jacqualin CombesBetty J Siess MRN: [id number] Date of Birth: 11/26/1930   Medicare Important Message Given:  Yes    Elliot CousinShavis, Nathaly Dawkins Ellen, RN 12/26/2016, 11:04 AM

## 2016-12-26 NOTE — Care Management Note (Signed)
Case Management Note  Patient Details  Name: [name] CombesBetty J Beeck MRN: [id number] Date of Birth: 11/15/1930  Subjective/Objective:  UTI, acute pyelonephritis                  Action/Plan: Discharge Planning: NCM spoke to pt and son/DIL, Gregg/Dino Asby at bedside. Offeredc choice for HH/list provided. Pt states she had [facility] in the past. [facility] [place] [facility] but will stay with son for a few weeks, [address], [place] [phone]. [facility]. Provided son's address. Pt has RW, and bedside commode at home.   PCP Geoffry ParadiseARONSON, RICHARD MD  Expected Discharge Date:  12/26/16               Expected Discharge Plan:  Home w Home Health Services  In-House Referral:  Clinical Social Work  Discharge planning Services  CM Consult  Post Acute Care Choice:  Home Health Choice offered to:  Patient  DME Arranged:  N/A DME Agency:  NA  HH Arranged:  PT, OT, RN, Refused SNF HH [facility]  Status of Service:  Completed, signed off  If discussed at Long Length of Stay Meetings, dates discussed:    Additional Comments:  Elliot CousinShavis, Laroy Mustard Ellen, RN 12/26/2016, 11:16 AM

## 2016-12-27 ENCOUNTER — Telehealth (HOSPITAL_COMMUNITY): Payer: Self-pay

## 2016-12-27 ENCOUNTER — Encounter (HOSPITAL_COMMUNITY): Payer: Self-pay | Admitting: Interventional Radiology

## 2016-12-27 LAB — CULTURE, BLOOD (ROUTINE X 2)
CULTURE: NO GROWTH
CULTURE: NO GROWTH
Special Requests: ADEQUATE
Special Requests: ADEQUATE

## 2016-12-27 NOTE — Telephone Encounter (Signed)
Called to schedule 2 wk f/u, no answer, vm full. AW 

## 2016-12-28 ENCOUNTER — Encounter (HOSPITAL_COMMUNITY): Payer: Self-pay

## 2016-12-30 ENCOUNTER — Telehealth (HOSPITAL_COMMUNITY): Payer: Self-pay

## 2016-12-30 NOTE — Telephone Encounter (Signed)
Called daughter's phone call, left message to call back. AW

## 2017-01-03 LAB — CBC AND DIFFERENTIAL
HEMATOCRIT: 35 — AB (ref 36–46)
HEMOGLOBIN: 11.3 — AB (ref 12.0–16.0)
PLATELETS: 143 — AB (ref 150–399)
WBC: 8.6

## 2017-01-03 LAB — BASIC METABOLIC PANEL
BUN: 17 (ref 4–21)
Creatinine: 0.8 (ref 0.5–1.1)
Glucose: 115
Potassium: 4.5 (ref 3.4–5.3)
SODIUM: 130 — AB (ref 137–147)

## 2017-02-10 LAB — CBC AND DIFFERENTIAL
HEMATOCRIT: 36 (ref 36–46)
HEMOGLOBIN: 11.5 — AB (ref 12.0–16.0)
PLATELETS: 92 — AB (ref 150–399)
WBC: 4

## 2017-02-10 LAB — BASIC METABOLIC PANEL
BUN: 18 (ref 4–21)
Creatinine: 0.8 (ref 0.5–1.1)
GLUCOSE: 111
POTASSIUM: 4.7 (ref 3.4–5.3)
SODIUM: 130 — AB (ref 137–147)

## 2017-02-10 LAB — LIPID PANEL
CHOLESTEROL: 186 (ref 0–200)
HDL: 65 (ref 35–70)
LDL CALC: 107
TRIGLYCERIDES: 70 (ref 40–160)

## 2017-02-10 LAB — HEPATIC FUNCTION PANEL
ALT: 22 (ref 7–35)
AST: 32 (ref 13–35)
Alkaline Phosphatase: 106 (ref 25–125)
Bilirubin, Total: 0.5

## 2017-02-10 LAB — VITAMIN D 25 HYDROXY (VIT D DEFICIENCY, FRACTURES): Vit D, 25-Hydroxy: 50.7

## 2017-02-10 LAB — TSH: TSH: 2.29 (ref 0.41–5.90)

## 2017-02-15 ENCOUNTER — Telehealth (HOSPITAL_COMMUNITY): Payer: Self-pay | Admitting: Radiology

## 2017-02-15 NOTE — Telephone Encounter (Signed)
Pt called and wanted to know if she could drive now, ride her stationary bike, and do some minimal exercises. I told her that I would speak to Deveshwar and let her know. Patient is allowed to drive now, ride her stationary bike, and is encouraged to walk for exercise. Pt agrees with this plan of care. JM

## 2017-05-02 ENCOUNTER — Encounter: Payer: Self-pay | Admitting: Cardiology

## 2017-05-02 ENCOUNTER — Ambulatory Visit (INDEPENDENT_AMBULATORY_CARE_PROVIDER_SITE_OTHER): Payer: Medicare Other | Admitting: Cardiology

## 2017-05-02 VITALS — BP 157/71 | HR 85 | Ht 64.0 in | Wt 144.8 lb

## 2017-05-02 DIAGNOSIS — R600 Localized edema: Secondary | ICD-10-CM | POA: Diagnosis not present

## 2017-05-02 DIAGNOSIS — I5030 Unspecified diastolic (congestive) heart failure: Secondary | ICD-10-CM | POA: Diagnosis not present

## 2017-05-02 DIAGNOSIS — I351 Nonrheumatic aortic (valve) insufficiency: Secondary | ICD-10-CM | POA: Diagnosis not present

## 2017-05-02 DIAGNOSIS — I4589 Other specified conduction disorders: Secondary | ICD-10-CM

## 2017-05-02 DIAGNOSIS — I110 Hypertensive heart disease with heart failure: Secondary | ICD-10-CM

## 2017-05-02 DIAGNOSIS — I10 Essential (primary) hypertension: Secondary | ICD-10-CM | POA: Diagnosis not present

## 2017-05-02 NOTE — Progress Notes (Signed)
PCP: Burnard Bunting, MD  Clinic Note: Chief Complaint  Patient presents with  . Follow-up    HTN & chronotropic incompetence    HPI: [name] [name] is a 83 y.o. female with a PMH below who presents today for annual follow-up for hypertension and chronotropic incompetence.[name] [name] was last seen on 04/30/2016 really doing well without any major complaints. She'll and notes that she would "give out energy "trying to stay balanced. She is having exertional dyspnea from COPD, but no chest tightness or pressure.  Recent Hospitalizations:   09/23/2016 with L1 compression fracture. - Has had multiple attempts a kyphoplasty  May 2018 for pyelonephritis.  Studies Personally Reviewed - (if available, images/films reviewed: From Epic Chart or Care Everywhere)  none  Interval History: Yenny returns today overall stable cardiac standpoint thankfully. She is under tremendous pain from her back fracture. Despite having to kyphoplasty she's not had any relief of symptoms. She is very reluctant to use anything stronger than Tylenol or Aleve. With her back hurting her somewhat, she is limited to using her rolling walker, and is really not active. Thankfully, from a cardiac standpoint severe pulmonary stable with no chest tightness or pressure with rest or exertion. Even though she's not doing much, simply moving around is quite significant amount of exertion for her. She is having to sleep in a chair mostly because of her back pain, and therefore does not note any PND or orthopnea. She does have chronic motion edema that seems to be stable. She has as needed Lasix which she is not using very much. May be once a week. Mostly because she is terrified of having to get up and go to the bathroom quickly.   With the effort she has to do walking, she does note exertional dyspnea, that is stable for her..  No palpitations, lightheadedness, dizziness, weakness or syncope/near syncope. No TIA/amaurosis  fugax symptoms. No melena, hematochezia, hematuria, or epstaxis. No claudication.  ROS: A comprehensive was performed. Review of Systems  Constitutional: Positive for malaise/fatigue.  HENT: Negative for congestion.   Respiratory: Positive for shortness of breath (Stable). Negative for cough.   Cardiovascular: Negative for claudication.  Gastrointestinal: Negative for abdominal pain, constipation and heartburn.  Genitourinary: Negative for dysuria.  Musculoskeletal: Positive for back pain. Negative for falls (No reasonable).  Neurological: Positive for dizziness and weakness (Global).  Endo/Heme/Allergies: Negative for environmental allergies.  Psychiatric/Behavioral: Positive for memory loss. Negative for depression. The patient has insomnia (Mostly because of pain). The patient is not nervous/anxious.   All other systems reviewed and are negative.   I have reviewed and (if needed) personally updated the patient's problem list, medications, allergies, past medical and surgical history, social and family history.   Past Medical History:  Diagnosis Date  . Anemia   . Anxiety   . Chronotropic incompetence 12/2012    potentially medication related; noted on CPET   . DDD (degenerative disc disease)   . Eczema   . GERD (gastroesophageal reflux disease)   . H/O hiatal hernia   . Hyperlipidemia   . Hypertension   . Hypothyroidism   . Osteopenia   . Septicemia ([place]) 2002   following UTI  . Vertigo, benign positional     Past Surgical History:  Procedure Laterality Date  . BREAST SURGERY     left biopsy  . CPET / MET - PFTS     Consistent with chronotropic incompetence; See attached report in the results section  .  DILATION AND CURETTAGE OF UTERUS     x 2  . DOPPLER ECHOCARDIOGRAPHY  01/10/2013   Normal LV size and function. Normal EF. Air sclerosis but no stenosis  . ESOPHAGOGASTRODUODENOSCOPY  05/04/2012   Procedure: ESOPHAGOGASTRODUODENOSCOPY (EGD);  Surgeon: Inda Castle, MD;  Location: [facility];  Service: Endoscopy;  Laterality: N/A;  . EYE SURGERY     cataract extraction with ILO  ? eye  . IR KYPHO EA ADDL LEVEL THORACIC OR LUMBAR  10/28/2016  . IR KYPHO LUMBAR INC FX REDUCE BONE BX UNI/BIL CANNULATION INC/IMAGING  10/28/2016  . IR KYPHO THORACIC WITH BONE BIOPSY  12/24/2016  . IR RADIOLOGIST EVAL & MGMT  11/11/2016  . KNEE ARTHROSCOPY  2011   right  . TONSILLECTOMY    . TOTAL KNEE ARTHROPLASTY  04/24/2012   Procedure: TOTAL KNEE ARTHROPLASTY;  Surgeon: Gearlean Alf, MD;  Location: [facility];  Service: Orthopedics;  Laterality: Right;    Current Meds  Medication Sig  . acetaminophen (TYLENOL) 325 MG tablet Take 650 mg by mouth every 6 (six) hours as needed for mild pain.   Marland Kitchen ALPRAZolam (XANAX) 0.25 MG tablet Take 0.25 mg by mouth at bedtime as needed for anxiety.   Marland Kitchen amLODipine (NORVASC) 10 MG tablet Take 1 tablet (10 mg total) by mouth daily.  Marland Kitchen aspirin EC 81 MG tablet Take 81 mg by mouth daily.  . calcium carbonate (OS-CAL) 600 MG TABS Take 600 mg by mouth 2 (two) times daily with a meal.  . cholecalciferol (VITAMIN D) 1000 UNITS tablet Take 1,000 Units by mouth daily.  . DULoxetine HCl (CYMBALTA PO) Take 30 mg by mouth daily.   . feeding supplement, ENSURE ENLIVE, (ENSURE ENLIVE) LIQD Take 237 mLs by mouth daily as needed (If pt/family requests or if intakes of meals are poor).  . fish oil-omega-3 fatty acids 1000 MG capsule Take 1 g by mouth daily.  . furosemide (LASIX) 20 MG tablet Take 20 mg by mouth daily as needed for fluid or edema.  Marland Kitchen HYDROcodone-acetaminophen (NORCO/VICODIN) 5-325 MG tablet Take 1 tablet by mouth every 6 (six) hours as needed for moderate pain.  Marland Kitchen levothyroxine (SYNTHROID, LEVOTHROID) 75 MCG tablet Take 1 tablet (75 mcg total) by mouth daily before breakfast.  . losartan (COZAAR) 100 MG tablet TAKE 1 TABLET BY MOUTH  DAILY  . MELATONIN PO Take 1 tablet by mouth at bedtime as needed (sleep).  . methocarbamol (ROBAXIN) 500  MG tablet Take 1 tablet (500 mg total) by mouth every 8 (eight) hours as needed for muscle spasms.  . Multiple Vitamin (MULTIVITAMIN WITH MINERALS) TABS tablet Take 1 tablet by mouth daily.  Marland Kitchen omeprazole (PRILOSEC) 40 MG capsule Take 40 mg by mouth daily.  . polyethylene glycol (MIRALAX / GLYCOLAX) packet Take 17 g by mouth daily.  Marland Kitchen senna-docusate (SENOKOT-S) 8.6-50 MG tablet Take 2 tablets by mouth at bedtime.  . traMADol (ULTRAM) 50 MG tablet Take 50 mg by mouth every 8 (eight) hours as needed for moderate pain or severe pain.  . TYMLOS 3120 MCG/1.56ML SOPN Inject 80 mcg as directed daily.   . vitamin B-12 (CYANOCOBALAMIN) 1000 MCG tablet Take 1,000 mcg by mouth daily.    No Known Allergies  Social History   Social History  . Marital status: Widowed    Spouse name: N/A  . Number of children: 2  . Years of education: N/A   Social History Main Topics  . Smoking status: Former Smoker    Years:  0.00    Types: Cigarettes    Quit date: 05/03/1965  . Smokeless tobacco: Never Used  . Alcohol use Yes     Comment: occ glass wine  . Drug use: No  . Sexual activity: Not Asked   Other Topics Concern  . None   Social History Narrative   She is a widowed mother of 2.   She is a former smoker, with a distant history having quit in 1966.   She does drink an occasional glass of red wine.   She is just now started to try doing exercise with water aerobics.    family history includes Heart attack (age of onset: 25) in her mother; Lung cancer (age of onset: 69) in her father; Stroke (age of onset: 47) in her mother.  Wt Readings from Last 3 Encounters:  05/02/17 144 lb 12.8 oz (65.7 kg)  12/22/16 150 lb 5.7 oz (68.2 kg)  10/28/16 151 lb (68.5 kg)    PHYSICAL EXAM BP (!) 157/71   Pulse 85   Ht 5' 4" (1.626 m)   Wt 144 lb 12.8 oz (65.7 kg)   SpO2 99%   BMI 24.85 kg/m  Physical Exam  Constitutional: She is oriented to person, place, and time. She appears well-developed and  well-nourished. No distress.  Quite uncomfortable.  HENT:  Head: Normocephalic and atraumatic.  Neck: No hepatojugular reflux and no JVD present. Carotid bruit is not present.  Cardiovascular: Normal rate, regular rhythm and intact distal pulses.   No extrasystoles are present. PMI is not displaced.  Exam reveals no gallop and no friction rub.   Murmur heard.  Medium-pitched harsh crescendo-decrescendo early systolic murmur is present with a grade of 2/6  at the upper right sternal border radiating to the neck Pulmonary/Chest: Effort normal and breath sounds normal. No respiratory distress. She has no wheezes. She has no rales.  Abdominal: Soft. Bowel sounds are normal. She exhibits no distension. There is no tenderness. There is no rebound.  Musculoskeletal: She exhibits no edema.  Significant kyphoscoliosis noted.  Neurological: She is alert and oriented to person, place, and time.  Skin: Skin is warm and dry. No rash noted. No erythema.  Psychiatric: She has a normal mood and affect. Her behavior is normal. Judgment and thought content normal.  Vitals reviewed.   Adult ECG Report n/a  Other studies Reviewed: Additional studies/ records that were reviewed today include:  Recent Labs:   Lab Results  Component Value Date   CREATININE 0.51 12/26/2016   BUN 18 12/26/2016   NA 131 (L) 12/26/2016   K 4.3 12/26/2016   CL 95 (L) 12/26/2016   CO2 28 12/26/2016   No results found for: CHOL, HDL, LDLCALC, LDLDIRECT, TRIG, CHOLHDL  ASSESSMENT / PLAN: Problem List Items Addressed This Visit    Aortic ejection murmur (Chronic)    No real change in murmur. Suspect this is still related to aortic sclerosis. Unless the murmur seems to change in intensity, but would not recheck an echocardiogram.      Bilateral leg edema (Chronic)    Pretty much stable with low-dose when necessary Lasix.      Chronotropic incompetence (Chronic)    She has had difficulty reaching target heart rate on  stress tests. Avoid AV nodal agents. No signs or symptoms of heart block or pauses. Continue to monitor.  Currently no indication for pacemaker.      Essential hypertension (Chronic)    Pressures are somewhat elevated, but within safe  range for permissive hypertension. She has had orthostatic dizziness and falls. As such, we have stopped using thiazide diuretics and only using when necessary furosemide for edema. She is on stable dose of losartan and amlodipine. Not currently having much in the way of orthostatic dizziness. I think it would continue to monitor him, especially in light of her recent fall and back fracture.      Hypertensive heart disease - Primary (Chronic)    Likely has diastolic dysfunction, but is not overly symptomatic. She is not requiring much in the way of any additional furosemide. She is on high-dose losartan and amlodipine. Unfortunately we are torn between treating her because of heart disease and allowing for permissive hypertension. At this point, since she seems to be stable from a diastolic heart failure standpoint, I would allow for continued permissive hypertension.         Current medicines are reviewed at length with the patient today. (+/- concerns) n/a The following changes have been made: n/a  Patient Instructions  No change with medications and treatment.    Your physician wants you to follow-up in 12 months with Dr Ellyn Hack. You will receive a reminder letter in the mail two months in advance. If you don't receive a letter, please call our office to schedule the follow-up appointment.   If you need a refill on your cardiac medications before your next appointment, please call your pharmacy.    Studies Ordered:   No orders of the defined types were placed in this encounter.     Glenetta Hew, M.D., M.S. Interventional Cardiologist   Pager # [phone] Phone # [phone] [address] [place], [place] [address]

## 2017-05-02 NOTE — Patient Instructions (Signed)
No change with medications and treatment.    Your physician wants you to follow-up in 12 months with Dr Herbie Baltimore. You will receive a reminder letter in the mail two months in advance. If you don't receive a letter, please call our office to schedule the follow-up appointment.   If you need a refill on your cardiac medications before your next appointment, please call your pharmacy.

## 2017-05-04 ENCOUNTER — Encounter: Payer: Self-pay | Admitting: Cardiology

## 2017-05-04 NOTE — Assessment & Plan Note (Signed)
Likely has diastolic dysfunction, but is not overly symptomatic. She is not requiring much in the way of any additional furosemide. She is on high-dose losartan and amlodipine. Unfortunately we are torn between treating her because of heart disease and allowing for permissive hypertension. At this point, since she seems to be stable from a diastolic heart failure standpoint, I would allow for continued permissive hypertension.

## 2017-05-04 NOTE — Assessment & Plan Note (Signed)
No real change in murmur. Suspect this is still related to aortic sclerosis. Unless the murmur seems to change in intensity, but would not recheck an echocardiogram.

## 2017-05-04 NOTE — Assessment & Plan Note (Signed)
Pressures are somewhat elevated, but within safe range for permissive hypertension. She has had orthostatic dizziness and falls. As such, we have stopped using thiazide diuretics and only using when necessary furosemide for edema. She is on stable dose of losartan and amlodipine. Not currently having much in the way of orthostatic dizziness. I think it would continue to monitor him, especially in light of her recent fall and back fracture.

## 2017-05-04 NOTE — Assessment & Plan Note (Signed)
Pretty much stable with low-dose when necessary Lasix.

## 2017-05-04 NOTE — Assessment & Plan Note (Signed)
She has had difficulty reaching target heart rate on stress tests. Avoid AV nodal agents. No signs or symptoms of heart block or pauses. Continue to monitor.  Currently no indication for pacemaker.

## 2017-05-11 ENCOUNTER — Other Ambulatory Visit: Payer: Self-pay | Admitting: Cardiology

## 2017-05-23 ENCOUNTER — Encounter: Payer: Self-pay | Admitting: *Deleted

## 2017-05-25 ENCOUNTER — Non-Acute Institutional Stay: Payer: Medicare Other | Admitting: Internal Medicine

## 2017-05-25 ENCOUNTER — Encounter: Payer: Self-pay | Admitting: Internal Medicine

## 2017-05-25 VITALS — BP 132/70 | HR 66 | Temp 97.3°F | Resp 16 | Ht 64.0 in | Wt 151.2 lb

## 2017-05-25 DIAGNOSIS — M810 Age-related osteoporosis without current pathological fracture: Secondary | ICD-10-CM | POA: Diagnosis not present

## 2017-05-25 DIAGNOSIS — M549 Dorsalgia, unspecified: Secondary | ICD-10-CM

## 2017-05-25 DIAGNOSIS — D696 Thrombocytopenia, unspecified: Secondary | ICD-10-CM

## 2017-05-25 DIAGNOSIS — R600 Localized edema: Secondary | ICD-10-CM

## 2017-05-25 DIAGNOSIS — N3090 Cystitis, unspecified without hematuria: Secondary | ICD-10-CM | POA: Diagnosis not present

## 2017-05-25 DIAGNOSIS — I10 Essential (primary) hypertension: Secondary | ICD-10-CM | POA: Diagnosis not present

## 2017-05-25 DIAGNOSIS — K2100 Gastro-esophageal reflux disease with esophagitis, without bleeding: Secondary | ICD-10-CM

## 2017-05-25 DIAGNOSIS — E538 Deficiency of other specified B group vitamins: Secondary | ICD-10-CM | POA: Diagnosis not present

## 2017-05-25 DIAGNOSIS — F418 Other specified anxiety disorders: Secondary | ICD-10-CM | POA: Diagnosis not present

## 2017-05-25 DIAGNOSIS — K5909 Other constipation: Secondary | ICD-10-CM

## 2017-05-25 DIAGNOSIS — E785 Hyperlipidemia, unspecified: Secondary | ICD-10-CM

## 2017-05-25 DIAGNOSIS — G8929 Other chronic pain: Secondary | ICD-10-CM

## 2017-05-25 DIAGNOSIS — D508 Other iron deficiency anemias: Secondary | ICD-10-CM

## 2017-05-25 DIAGNOSIS — E039 Hypothyroidism, unspecified: Secondary | ICD-10-CM

## 2017-05-25 MED ORDER — NAPROXEN SODIUM 220 MG PO CAPS: ORAL_CAPSULE | ORAL | 3 refills | Status: DC

## 2017-05-25 NOTE — Patient Instructions (Addendum)
  Make sure to take omeprazole first thing in the morning empty stomach to protect your stomach ling and prevent bleeding.   Take your aleve only with food. If you notice black colored stool or blood in stool, please let us know.   Stop taking cymbalta for now. If you feel depressed or have any thoughts about hurting yourself or other, please call us right away at [phone].   Please bring a copy of your living will and health care power of attorney paper work on your next visit.

## 2017-05-25 NOTE — Progress Notes (Signed)
[facility]  Provider: Blanchie Serve MD   Location:  [place] of Service:  Clinic (12)  PCP: Blanchie Serve, MD Patient Care Team: Blanchie Serve, MD as PCP - General (Internal Medicine)  Extended Emergency Contact Information Primary Emergency Contact: Tramel,[name] Address: [place], [place] [address] [address] of [place] Phone: [phone] Mobile Phone: [phone] Relation: Son Secondary Emergency Contact: Proehl,[name]  [place] Phone: [phone] Relation: None  Goals of Care: Advanced Directive information Advanced Directives 12/22/2016  Does Patient Have a Medical Advance Directive? Yes  Type of Advance Directive Living will  Does patient want to make changes to medical advance directive? No - Patient declined  Copy of [place] in Chart? -      Chief Complaint  Patient presents with  . New Patient (Initial Visit)    Establishing care with [facility].   . Medication Refill    No refills needed at this time.     HPI: Patient is a 83 y.o. female seen today to establish care. She was seeing Dr Doreene Adas at [facility] prior to this. She was last seen 6 months back and mentions having blood work then. No prior records available this visit for review from PCP office. Reviewed notes from hospitalization in 12/2016. Reviewed her current medications and problem list as below.   Essential hypertension- on amlodipine 10 mg daily and losartan 100 mg daily with baby aspirin. Denies headache or chest pain or dyspnea. Complaint with her medication.   Hyperlipidemia- currently on fish oil supplement. Tries to walk for exercise but her back pain limits it. Has gained few pounds per patient.   gerd- with history of gi bleed. On omeprazole 40 mg daily. Denies any heartburn or indigestion at present. No bleed reported.   Depression- on cymbalta 30 mg daily and prn  xanax. Taking cymbalta 30 mg every other day at present and trying to come off it.   Hypothyroidism- currently on levothyroxine 75 mcg daily. On stool softener to help with her constipation.   Chronic back pain- s/p kyphoplasty on 12/24/16. Ongoing pain to her back. Denies radiation. Last fall a year back.currently on aleve 220 mg daily in morning with tylenol 650 mg q6h prn pain. Also on cymbalta 30 mg daily. Uses a walker.   Leg edema- keeping legs elevated helps. On prn lasix but has not required it recently.   Chronic thrombocytopenia- denies any bleeding at present.   Osteoporosis- with history of fracture. Currently on tymlos 80 mcg injection daily. Also on calcium and vitamin D supplement bid.   Recurrent cystitis- currently on nitrofurantoin 50 mg daily for prophylaxis, has been on this for few months. Denies any dysuria, hematuria or flank pain. Was hospitalized in 12/2016 with UTI and acute pyelonephritis. Currently on cranberry supplement.   b12 deficiency- on b12 supplement 1000 mcg daily.   Chronic constipation- currently on senokot s 2 tab at bedtime.     Past Medical History:  Diagnosis Date  . Anemia   . Anxiety   . Chronotropic incompetence 12/2012    potentially medication related; noted on CPET   . DDD (degenerative disc disease)   . Eczema   . GERD (gastroesophageal reflux disease)   . H/O hiatal hernia   . Hx: UTI (urinary tract infection)   . Hyperlipidemia   . Hypertension   .  Hypothyroidism   . Osteopenia   . Septicemia ([place]) 2002   following UTI  . Vertigo, benign positional    Past Surgical History:  Procedure Laterality Date  . BREAST SURGERY     left biopsy  . CPET / MET - PFTS     Consistent with chronotropic incompetence; See attached report in the results section  . DILATION AND CURETTAGE OF UTERUS     x 2  . DOPPLER ECHOCARDIOGRAPHY  01/10/2013   Normal LV size and function. Normal EF. Air sclerosis but no stenosis  .  ESOPHAGOGASTRODUODENOSCOPY  05/04/2012   Procedure: ESOPHAGOGASTRODUODENOSCOPY (EGD);  Surgeon: Inda Castle, MD;  Location: [facility];  Service: Endoscopy;  Laterality: N/A;  . EYE SURGERY     cataract extraction with ILO  ? eye  . IR KYPHO EA ADDL LEVEL THORACIC OR LUMBAR  10/28/2016  . IR KYPHO LUMBAR INC FX REDUCE BONE BX UNI/BIL CANNULATION INC/IMAGING  10/28/2016  . IR KYPHO THORACIC WITH BONE BIOPSY  12/24/2016  . IR RADIOLOGIST EVAL & MGMT  11/11/2016  . KNEE ARTHROSCOPY  2011   right  . TONSILLECTOMY    . TOTAL KNEE ARTHROPLASTY  04/24/2012   Procedure: TOTAL KNEE ARTHROPLASTY;  Surgeon: Gearlean Alf, MD;  Location: [facility];  Service: Orthopedics;  Laterality: Right;    reports that she quit smoking about 52 years ago. Her smoking use included Cigarettes. She quit after 0.00 years of use. She has never used smokeless tobacco. She reports that she does not drink alcohol or use drugs. Social History   Social History  . Marital status: Widowed    Spouse name: [name]  . Number of children: 3  . Years of education: N/A   Occupational History  . homemaker    Social History Main Topics  . Smoking status: Former Smoker    Years: 0.00    Types: Cigarettes    Quit date: 05/03/1965  . Smokeless tobacco: Never Used  . Alcohol use No     Comment: occ glass wine  . Drug use: No  . Sexual activity: Not on file   Other Topics Concern  . Not on file   Social History Narrative   She is a widowed mother of 3.   She is a former smoker, with a distant history having quit in 1966.   She does drink an occasional glass of red wine.   She is just now started to try doing exercise with water aerobics.    Functional Status Survey:    Family History  Problem Relation Age of Onset  . Lung cancer Father 62       Died at age 35  . Stroke Mother 83       Died in her late 23s.  Marland Kitchen Heart attack Mother 34  . Diabetes Mother   . Arthritis Mother     Health Maintenance  Topic Date Due   . Samul Dada  07/31/1949  . DEXA SCAN  07/30/1997  . PNA vac Low Risk Adult (1 of 2 - PCV13) 07/30/1997  . INFLUENZA VACCINE  02/23/2017    No Known Allergies  Outpatient Encounter Prescriptions as of 05/25/2017  Medication Sig  . acetaminophen (TYLENOL) 325 MG tablet Take 650 mg by mouth every 6 (six) hours as needed for mild pain.   Marland Kitchen ALPRAZolam (XANAX) 0.25 MG tablet Take 0.25 mg by mouth at bedtime as needed for anxiety.   Marland Kitchen amLODipine (NORVASC) 10 MG tablet Take 1 tablet (10  mg total) by mouth daily.  Marland Kitchen aspirin EC 81 MG tablet Take 81 mg by mouth daily.  . calcium carbonate (OS-CAL) 600 MG TABS Take 600 mg by mouth 2 (two) times daily with a meal.  . cholecalciferol (VITAMIN D) 1000 UNITS tablet Take 1,000 Units by mouth daily.  . Cranberry 12600 MG CAPS Take 12,600 mg by mouth as needed.  . diphenhydramine-acetaminophen (TYLENOL PM) 25-500 MG TABS tablet Take 1 tablet by mouth at bedtime as needed.   . DULoxetine HCl (CYMBALTA PO) Take 30 mg by mouth daily.   . fish oil-omega-3 fatty acids 1000 MG capsule Take 1 g by mouth daily.  . furosemide (LASIX) 20 MG tablet Take 20 mg by mouth as needed for fluid or edema.   Marland Kitchen levothyroxine (SYNTHROID, LEVOTHROID) 75 MCG tablet Take 1 tablet (75 mcg total) by mouth daily before breakfast.  . losartan (COZAAR) 100 MG tablet TAKE 1 TABLET BY MOUTH  DAILY  . Multiple Vitamin (MULTIVITAMIN WITH MINERALS) TABS tablet Take 1 tablet by mouth daily.  . nitrofurantoin (MACRODANTIN) 50 MG capsule Take 50 mg by mouth daily.  Marland Kitchen omeprazole (PRILOSEC) 40 MG capsule Take 40 mg by mouth daily.  Marland Kitchen senna-docusate (SENOKOT-S) 8.6-50 MG tablet Take 2 tablets by mouth at bedtime.  . TYMLOS 3120 MCG/1.56ML SOPN Inject 80 mcg as directed daily.   . vitamin B-12 (CYANOCOBALAMIN) 1000 MCG tablet Take 1,000 mcg by mouth daily.  . feeding supplement, ENSURE ENLIVE, (ENSURE ENLIVE) LIQD Take 237 mLs by mouth daily as needed (If pt/family requests or if intakes of  meals are poor). (Patient not taking: Reported on 05/25/2017)  . [DISCONTINUED] nitrofurantoin (MACRODANTIN) 100 MG capsule Take 100 mg by mouth daily.   No facility-administered encounter medications on file as of 05/25/2017.     Review of Systems  Constitutional: Positive for fatigue. Negative for appetite change, chills, diaphoresis and fever.  HENT: Negative for congestion, ear discharge, ear pain, hearing loss, mouth sores, rhinorrhea, sore throat, tinnitus and trouble swallowing.   Eyes: Negative for pain, discharge, itching and visual disturbance.       Had cataract surgery  Respiratory: Negative for cough, choking, shortness of breath and wheezing.   Cardiovascular: Positive for leg swelling. Negative for chest pain and palpitations.  Gastrointestinal: Positive for constipation. Negative for abdominal pain, anal bleeding, blood in stool, diarrhea, nausea and vomiting.  Genitourinary: Negative for dysuria, flank pain, frequency, hematuria and pelvic pain.       Wakes up twice a night to urinate  Musculoskeletal: Positive for back pain and gait problem. Negative for joint swelling.  Skin: Negative for rash and wound.  Neurological: Positive for tremors. Negative for dizziness, seizures, syncope, numbness and headaches.       Has history of vertigo.   Psychiatric/Behavioral: Positive for decreased concentration and dysphoric mood. Negative for behavioral problems, sleep disturbance and suicidal ideas. The patient is nervous/anxious.     Vitals:   05/25/17 1001  BP: 132/70  Pulse: 66  Resp: 16  Temp: (!) 97.3 F (36.3 C)  TempSrc: Oral  SpO2: 96%  Weight: 151 lb 3.2 oz (68.6 kg)  Height: '5\' 4"'  (1.626 m)   Body mass index is 25.95 kg/m.   Wt Readings from Last 3 Encounters:  05/25/17 151 lb 3.2 oz (68.6 kg)  05/02/17 144 lb 12.8 oz (65.7 kg)  12/22/16 150 lb 5.7 oz (68.2 kg)   Physical Exam  Constitutional: She is oriented to person, place, and time. She appears  well-developed and well-nourished. No distress.  HENT:  Head: Normocephalic and atraumatic.  Mouth/Throat: Oropharynx is clear and moist. No oropharyngeal exudate.  Eyes: Pupils are equal, round, and reactive to light. Conjunctivae and EOM are normal. Right eye exhibits no discharge. Left eye exhibits no discharge. No scleral icterus.  Neck: Normal range of motion. Neck supple. No thyromegaly present.  Cardiovascular: Normal rate, regular rhythm and intact distal pulses.   Pulmonary/Chest: Effort normal and breath sounds normal. She has no wheezes. She has no rales. She exhibits no tenderness.  Abdominal: Soft. Bowel sounds are normal. She exhibits no distension. There is no tenderness. There is no guarding.  Musculoskeletal: She exhibits edema.  Uses support of chair to get up and to sit. Uses front wheel rolling walker. Has kyphosis and scoliosis. No spinal tenderness on exam. Arthritis changes to fingers. Trace leg edema.   Lymphadenopathy:    She has no cervical adenopathy.  Neurological: She is alert and oriented to person, place, and time.  Skin: Skin is warm and dry. No rash noted. She is not diaphoretic.  Psychiatric: Her behavior is normal.  Flat affect. Good eye contact.     Labs reviewed: Basic Metabolic Panel:  Recent Labs  12/23/16 0516 12/24/16 0508 12/25/16 0604 12/26/16 0513  NA 132* 130* 132* 131*  K 4.5 4.1 4.2 4.3  CL 97* 98* 96* 95*  CO2 '26 24 28 28  ' GLUCOSE 106* 99 96 100*  BUN '15 12 13 18  ' CREATININE 0.55 0.49 0.53 0.51  CALCIUM 9.6 9.1 9.3 9.3  MG 2.0  --   --  2.1   Liver Function Tests:  Recent Labs  09/06/16 0234 12/21/16 2022  AST 45* 37  ALT 29 23  ALKPHOS 68 103  BILITOT 0.7 0.5  PROT 7.6 7.7  ALBUMIN 4.4 4.6    Recent Labs  09/06/16 0234  LIPASE 15   No results for input(s): AMMONIA in the last 8760 hours. CBC:  Recent Labs  12/23/16 0516 12/24/16 0508 12/26/16 0513  WBC 6.2 4.9 3.0*  NEUTROABS 3.6 2.4 1.4*  HGB 12.2  10.5* 10.1*  HCT 36.4 31.2* 30.3*  MCV 85.2 85.7 84.9  PLT 96* 72* 77*   Cardiac Enzymes: No results for input(s): CKTOTAL, CKMB, CKMBINDEX, TROPONINI in the last 8760 hours. BNP: Invalid input(s): POCBNP No results found for: HGBA1C Lab Results  Component Value Date   TSH 5.071 (H) 12/23/2016   Lab Results  Component Value Date   VITAMINB12 1,226 (H) 05/03/2012   Lab Results  Component Value Date   FOLATE 17.3 05/03/2012   Lab Results  Component Value Date   IRON 48 05/03/2012   TIBC 262 05/03/2012   FERRITIN 746 (H) 05/03/2012    Lipid Panel: No results for input(s): CHOL, HDL, LDLCALC, TRIG, CHOLHDL, LDLDIRECT in the last 8760 hours. No results found for: HGBA1C  Procedures since last visit: No results found.  Assessment/Plan  1. Essential hypertension Continue amlodipine and losartan with baby aspirin for now.  - CMP with eGFR; Future - Lipid Panel; Future - CBC with Differential/Platelets; Future - TSH; Future  2. Gastroesophageal reflux disease with esophagitis Continue omeprazole for now and monitor. Advised to take naproxen only with meals with history of bleed.   3. Other iron deficiency anemia Not on any supplement, check cbc - CBC with Differential/Platelets; Future  4. Bilateral leg edema Keep legs elevated at rest. Continue prn lasix. Obtain bmp  5. Depression with anxiety D/c cymbalta per pt request  and continue prn xanax. Depression screening next visit   6. Thrombocytopenia ([place]) Monitor for bleed. Check platelet count  7. B12 deficiency Continue b12 supplement - CBC with Differential/Platelets; Future - Vitamin B12; Future  8. Hyperlipidemia, unspecified hyperlipidemia type Continue fish oil - Lipid Panel; Future  9. Acquired hypothyroidism Continue levothyroxine - TSH; Future  10. Osteoporosis, unspecified osteoporosis type, unspecified pathological fracture presence Continue tymlos daily for now. Obtain dexa scan prior  record and PCP notes for review. Fall precautions.  - Vitamin D, 25-hydroxy; Future  11. Chronic back pain, unspecified back location, unspecified back pain laterality Continue naproxen daily with prn tylenol for now. Will need t review prior records.  - Naproxen Sodium (ALEVE) 220 MG CAPS; Take 1 tab daily in morning.  Dispense: 60 each; Refill: 3  12. Recurrent cystitis Continue prophylactic nitrofurantoin and cranberry supplement. Encouraged hydration  13. Chronic constipation Continue senokot s 2 tab at bedtime.     Labs/tests ordered:  As above  Next appointment: 1 month, PHQ 9 and MMSE then  Communication: reviewed care plan with patient     Blanchie Serve, MD Internal Medicine [place], [place] [address] Cell Phone (Monday-Friday 8 am - 5 pm): [phone] On Call: [phone] and follow prompts after 5 pm and on weekends Office Phone: [phone] Office Fax: [phone]

## 2017-06-23 LAB — COMPLETE METABOLIC PANEL WITH GFR
AG Ratio: 1.4 (calc) (ref 1.0–2.5)
ALBUMIN MSPROF: 4.2 g/dL (ref 3.6–5.1)
ALT: 27 U/L (ref 6–29)
AST: 38 U/L — AB (ref 10–35)
Alkaline phosphatase (APISO): 101 U/L (ref 33–130)
BILIRUBIN TOTAL: 0.4 mg/dL (ref 0.2–1.2)
BUN / CREAT RATIO: 44 (calc) — AB (ref 6–22)
BUN: 29 mg/dL — ABNORMAL HIGH (ref 7–25)
CHLORIDE: 96 mmol/L — AB (ref 98–110)
CO2: 28 mmol/L (ref 20–32)
Calcium: 10.1 mg/dL (ref 8.6–10.4)
Creat: 0.66 mg/dL (ref 0.60–0.88)
GFR, Est African American: 94 mL/min/{1.73_m2} (ref >=60)
GFR, Est Non African American: 81 mL/min/{1.73_m2} (ref >=60)
GLOBULIN: 2.9 g/dL (ref 1.9–3.7)
Glucose, Bld: 84 mg/dL (ref 65–99)
POTASSIUM: 4.7 mmol/L (ref 3.5–5.3)
SODIUM: 132 mmol/L — AB (ref 135–146)
Total Protein: 7.1 g/dL (ref 6.1–8.1)

## 2017-06-23 LAB — CBC WITH DIFFERENTIAL/PLATELET
BASOS PCT: 1 %
Basophils Absolute: 29 cells/uL (ref 0–200)
EOS ABS: 81 {cells}/uL (ref 15–500)
Eosinophils Relative: 2.8 %
HEMATOCRIT: 30.6 % — AB (ref 35.0–45.0)
HEMOGLOBIN: 10.3 g/dL — AB (ref 11.7–15.5)
LYMPHS ABS: 977 {cells}/uL (ref 850–3900)
MCH: 28.8 pg (ref 27.0–33.0)
MCHC: 33.7 g/dL (ref 32.0–36.0)
MCV: 85.5 fL (ref 80.0–100.0)
Monocytes Relative: 27.4 %
Neutro Abs: 1018 cells/uL — ABNORMAL LOW (ref 1500–7800)
Neutrophils Relative %: 35.1 %
Platelets: 76 10*3/uL — ABNORMAL LOW (ref 140–400)
RBC: 3.58 10*6/uL — ABNORMAL LOW (ref 3.80–5.10)
RDW: 15 % (ref 11.0–15.0)
TOTAL LYMPHOCYTE: 33.7 %
WBC: 2.9 10*3/uL — ABNORMAL LOW (ref 3.8–10.8)
WBCMIX: 795 {cells}/uL (ref 200–950)

## 2017-06-23 LAB — VITAMIN B12: Vitamin B-12: >2000 pg/mL — ABNORMAL HIGH (ref 200–1100)

## 2017-06-23 LAB — LIPID PANEL
CHOL/HDL RATIO: 2.2 (calc) (ref <5.0)
CHOLESTEROL: 205 mg/dL — AB (ref <200)
HDL: 95 mg/dL (ref >50)
LDL Cholesterol (Calc): 95 mg/dL (calc)
Non-HDL Cholesterol (Calc): 110 mg/dL (calc) (ref <130)
TRIGLYCERIDES: 63 mg/dL (ref <150)

## 2017-06-23 LAB — TSH: TSH: 4.11 m[IU]/L (ref 0.40–4.50)

## 2017-06-23 LAB — VITAMIN D 25 HYDROXY (VIT D DEFICIENCY, FRACTURES): Vit D, 25-Hydroxy: 47 ng/mL (ref 30–100)

## 2017-06-29 ENCOUNTER — Encounter: Payer: Self-pay | Admitting: Internal Medicine

## 2017-06-29 ENCOUNTER — Non-Acute Institutional Stay: Payer: Medicare Other | Admitting: Internal Medicine

## 2017-06-29 VITALS — BP 136/68 | HR 60 | Temp 98.1°F | Resp 16 | Ht 64.0 in | Wt 156.2 lb

## 2017-06-29 DIAGNOSIS — D61818 Other pancytopenia: Secondary | ICD-10-CM

## 2017-06-29 DIAGNOSIS — E538 Deficiency of other specified B group vitamins: Secondary | ICD-10-CM

## 2017-06-29 DIAGNOSIS — F321 Major depressive disorder, single episode, moderate: Secondary | ICD-10-CM | POA: Diagnosis not present

## 2017-06-29 DIAGNOSIS — G8929 Other chronic pain: Secondary | ICD-10-CM | POA: Diagnosis not present

## 2017-06-29 MED ORDER — BUPROPION HCL 100 MG PO TABS: 100.0000 mg | ORAL_TABLET | Freq: Two times a day (BID) | ORAL | 3 refills | Status: DC

## 2017-06-29 NOTE — Progress Notes (Signed)
[facility]  Provider: Blanchie Serve MD   Location:  [place] of Service:  Clinic (12)  PCP: Blanchie Serve, MD Patient Care Team: Blanchie Serve, MD as PCP - General (Internal Medicine)  Extended Emergency Contact Information Primary Emergency Contact: Schoonmaker,[name] Address: [place], [place] [address] [address] of [place] Phone: [phone] Mobile Phone: [phone] Relation: Son Secondary Emergency Contact: Wiswell,[name]  [place] Phone: [phone] Relation: None  Goals of Care: Advanced Directive information Advanced Directives 12/22/2016  Does Patient Have a Medical Advance Directive? Yes  Type of Advance Directive Living will  Does patient want to make changes to medical advance directive? No - Patient declined  Copy of [place] in Chart? -      Chief Complaint  Patient presents with  . Medical Management of Chronic Issues    follow up with PHQ9 and MMSE    HPI: Patient is a 83 y.o. female seen today for follow up.  Depression- not taking cymbalta anymore. She feels low. PHQ 9 done today with score 7. Currently not on any medication for her mood. Has lost interest in day to day activities and has gained weight. Denies any suicidal ideas. Tearful this visit.   I had concerns for her memory last visit and wanted MMSE performed this viist. MMSE reviewed score 30/30  Ongoing back and joint pain, has stiffness but denies joint swelling. limiting her mobility, ambulates with her walker. Denies any fall. Denies any dizziness. Has chronic vertigo.    Past Medical History:  Diagnosis Date  . Anemia   . Anxiety   . Chronotropic incompetence 12/2012    potentially medication related; noted on CPET   . DDD (degenerative disc disease)   . Eczema   . GERD (gastroesophageal reflux disease)   . H/O hiatal hernia   . Hx: UTI (urinary tract infection)   . Hyperlipidemia     . Hypertension   . Hypothyroidism   . Osteopenia   . Septicemia ([place]) 2002   following UTI  . Vertigo, benign positional    Past Surgical History:  Procedure Laterality Date  . BREAST SURGERY     left biopsy  . CPET / MET - PFTS     Consistent with chronotropic incompetence; See attached report in the results section  . DILATION AND CURETTAGE OF UTERUS     x 2  . DOPPLER ECHOCARDIOGRAPHY  01/10/2013   Normal LV size and function. Normal EF. Air sclerosis but no stenosis  . ESOPHAGOGASTRODUODENOSCOPY  05/04/2012   Procedure: ESOPHAGOGASTRODUODENOSCOPY (EGD);  Surgeon: Inda Castle, MD;  Location: [facility];  Service: Endoscopy;  Laterality: N/A;  . EYE SURGERY     cataract extraction with ILO  ? eye  . IR KYPHO EA ADDL LEVEL THORACIC OR LUMBAR  10/28/2016  . IR KYPHO LUMBAR INC FX REDUCE BONE BX UNI/BIL CANNULATION INC/IMAGING  10/28/2016  . IR KYPHO THORACIC WITH BONE BIOPSY  12/24/2016  . IR RADIOLOGIST EVAL & MGMT  11/11/2016  . KNEE ARTHROSCOPY  2011   right  . TONSILLECTOMY    . TOTAL KNEE ARTHROPLASTY  04/24/2012   Procedure: TOTAL KNEE ARTHROPLASTY;  Surgeon: Gearlean Alf, MD;  Location: [facility];  Service: Orthopedics;  Laterality: Right;    reports that she quit smoking about 52 years ago. Her smoking use included cigarettes. She quit after  0.00 years of use. she has never used smokeless tobacco. She reports that she does not drink alcohol or use drugs. Social History   Socioeconomic History  . Marital status: Widowed    Spouse name: [name]  . Number of children: 3  . Years of education: Not on file  . Highest education level: Not on file  Social Needs  . Financial resource strain: Not on file  . Food insecurity - worry: Not on file  . Food insecurity - inability: Not on file  . Transportation needs - medical: Not on file  . Transportation needs - non-medical: Not on file  Occupational History  . Occupation: homemaker  Tobacco Use  . Smoking status: Former  Smoker    Years: 0.00    Types: Cigarettes    Last attempt to quit: 05/03/1965    Years since quitting: 52.1  . Smokeless tobacco: Never Used  Substance and Sexual Activity  . Alcohol use: No    Comment: occ glass wine  . Drug use: No  . Sexual activity: Not on file  Other Topics Concern  . Not on file  Social History Narrative   She is a widowed mother of 3.   She is a former smoker, with a distant history having quit in 1966.   She does drink an occasional glass of red wine.   She is just now started to try doing exercise with water aerobics.    Functional Status Survey:    Family History  Problem Relation Age of Onset  . Lung cancer Father 36       Died at age 17  . Stroke Mother 76       Died in her late 22s.  Marland Kitchen Heart attack Mother 34  . Diabetes Mother   . Arthritis Mother     Health Maintenance  Topic Date Due  . Samul Dada  07/31/1949  . DEXA SCAN  07/30/1997  . PNA vac Low Risk Adult (1 of 2 - PCV13) 07/30/1997  . INFLUENZA VACCINE  Completed    No Known Allergies  Outpatient Encounter Medications as of 06/29/2017  Medication Sig  . acetaminophen (TYLENOL) 325 MG tablet Take 650 mg by mouth every 6 (six) hours as needed for mild pain.   Marland Kitchen ALPRAZolam (XANAX) 0.25 MG tablet Take 0.25 mg by mouth at bedtime as needed for anxiety.   Marland Kitchen amLODipine (NORVASC) 10 MG tablet Take 1 tablet (10 mg total) by mouth daily.  Marland Kitchen aspirin EC 81 MG tablet Take 81 mg by mouth daily.  . calcium carbonate (OS-CAL) 600 MG TABS Take 600 mg by mouth 2 (two) times daily with a meal.  . cholecalciferol (VITAMIN D) 1000 UNITS tablet Take 1,000 Units by mouth daily.  . Cranberry 12600 MG CAPS Take 12,600 mg by mouth as needed.  . diphenhydramine-acetaminophen (TYLENOL PM) 25-500 MG TABS tablet Take 1 tablet by mouth at bedtime as needed.   . fish oil-omega-3 fatty acids 1000 MG capsule Take 1 g by mouth daily.  . furosemide (LASIX) 20 MG tablet Take 20 mg by mouth as needed for fluid  or edema.   Marland Kitchen levothyroxine (SYNTHROID, LEVOTHROID) 75 MCG tablet Take 1 tablet (75 mcg total) by mouth daily before breakfast.  . losartan (COZAAR) 100 MG tablet TAKE 1 TABLET BY MOUTH  DAILY  . Multiple Vitamin (MULTIVITAMIN WITH MINERALS) TABS tablet Take 1 tablet by mouth daily.  . nitrofurantoin (MACRODANTIN) 50 MG capsule Take 50 mg by mouth daily.  Marland Kitchen  omeprazole (PRILOSEC) 40 MG capsule Take 40 mg by mouth daily.  Marland Kitchen senna-docusate (SENOKOT-S) 8.6-50 MG tablet Take 2 tablets by mouth at bedtime.  . TYMLOS 3120 MCG/1.56ML SOPN Inject 80 mcg as directed daily.   . vitamin B-12 (CYANOCOBALAMIN) 1000 MCG tablet Take 1,000 mcg by mouth daily.  . Naproxen Sodium (ALEVE) 220 MG CAPS Take 1 tab daily in morning. (Patient not taking: Reported on 06/29/2017)  . [DISCONTINUED] feeding supplement, ENSURE ENLIVE, (ENSURE ENLIVE) LIQD Take 237 mLs by mouth daily as needed (If pt/family requests or if intakes of meals are poor). (Patient not taking: Reported on 05/25/2017)   No facility-administered encounter medications on file as of 06/29/2017.     Review of Systems  Constitutional:       Low in terms of energy  Eyes: Negative for pain, discharge, itching and visual disturbance.       Had cataract surgery  Respiratory: Positive for shortness of breath.        With exertion, none at rest  Cardiovascular: Negative for chest pain.  Gastrointestinal: Negative for abdominal pain, nausea and vomiting.  Musculoskeletal: Positive for arthralgias, back pain and gait problem.  Neurological: Positive for tremors.       Has BPV   Psychiatric/Behavioral: Positive for agitation and dysphoric mood. Negative for confusion, decreased concentration and suicidal ideas. The patient is nervous/anxious.     Vitals:   06/29/17 1049  BP: 136/68  Pulse: 60  Resp: 16  Temp: 98.1 F (36.7 C)  TempSrc: Oral  SpO2: 99%  Weight: 156 lb 3.2 oz (70.9 kg)  Height: '5\' 4"'  (1.626 m)   Body mass index is 26.81 kg/m.    Wt Readings from Last 3 Encounters:  06/29/17 156 lb 3.2 oz (70.9 kg)  05/25/17 151 lb 3.2 oz (68.6 kg)  05/02/17 144 lb 12.8 oz (65.7 kg)   Physical Exam  Constitutional: She is oriented to person, place, and time.  Elderly female in no distress  HENT:  Head: Normocephalic and atraumatic.  Eyes: Conjunctivae are normal. Pupils are equal, round, and reactive to light. Right eye exhibits no discharge.  Neck: Neck supple.  Cardiovascular: Normal rate, regular rhythm and intact distal pulses.  Pulmonary/Chest: Effort normal and breath sounds normal.  Abdominal: Soft. Bowel sounds are normal. There is no tenderness.  Musculoskeletal: She exhibits edema.  She needs support with her arms to get up from her chair. Has rolling walker with front wheels, brake and seat. Arthritis changes to fingers. Trace edema  Lymphadenopathy:    She has no cervical adenopathy.  Neurological: She is alert and oriented to person, place, and time.  Skin: Skin is warm and dry. No rash noted.  Psychiatric:  Tearful this visit. Flat affect    Labs reviewed: Basic Metabolic Panel: Recent Labs    12/23/16 0516  12/25/16 0604 12/26/16 0513 06/23/17 0810  NA 132*   < > 132* 131* 132*  K 4.5   < > 4.2 4.3 4.7  CL 97*   < > 96* 95* 96*  CO2 26   < > '28 28 28  ' GLUCOSE 106*   < > 96 100* 84  BUN 15   < > 13 18 29*  CREATININE 0.55   < > 0.53 0.51 0.66  CALCIUM 9.6   < > 9.3 9.3 10.1  MG 2.0  --   --  2.1  --    < > = values in this interval not displayed.   Liver Function  Tests: Recent Labs    09/06/16 0234 12/21/16 2022 06/23/17 0810  AST 45* 37 38*  ALT '29 23 27  ' ALKPHOS 68 103  --   BILITOT 0.7 0.5 0.4  PROT 7.6 7.7 7.1  ALBUMIN 4.4 4.6  --    Recent Labs    09/06/16 0234  LIPASE 15   No results for input(s): AMMONIA in the last 8760 hours. CBC: Recent Labs    12/24/16 0508 12/26/16 0513 06/23/17 0810  WBC 4.9 3.0* 2.9*  NEUTROABS 2.4 1.4* 1,018*  HGB 10.5* 10.1* 10.3*  HCT  31.2* 30.3* 30.6*  MCV 85.7 84.9 85.5  PLT 72* 77* 76*   Cardiac Enzymes: No results for input(s): CKTOTAL, CKMB, CKMBINDEX, TROPONINI in the last 8760 hours. BNP: Invalid input(s): POCBNP No results found for: HGBA1C Lab Results  Component Value Date   TSH 4.11 06/23/2017   Lab Results  Component Value Date   VITAMINB12 >2,000 (H) 06/23/2017   Lab Results  Component Value Date   FOLATE 17.3 05/03/2012   Lab Results  Component Value Date   IRON 48 05/03/2012   TIBC 262 05/03/2012   FERRITIN 746 (H) 05/03/2012    Lipid Panel: Recent Labs    06/23/17 0810  CHOL 205*  HDL 95  TRIG 63  CHOLHDL 2.2   No results found for: HGBA1C  Procedures since last visit: No results found.  Assessment/Plan  1. Current moderate episode of major depressive disorder, unspecified whether recurrent [facility]) Psychology referral- pt advised to see a psychologist.  - buPROPion (WELLBUTRIN) 100 MG tablet; Take 1 tablet (100 mg total) by mouth 2 (two) times daily.  Dispense: 60 tablet; Refill: 3  2. Pancytopenia ([place]) Drop in all cell lines noted and this has been there on lab review for few months. Has chronic fatigue.  - Ambulatory referral to Hematology  3. chronic pain  Taking tylenol only for now, has unsteady gait, ongoing pain, PT referral  4. b12 def High b12 level, d/c b12 supplement for now   Labs/tests ordered:  none  Next appointment: 1 month for depression follow up  Communication: reviewed care plan with patient     Blanchie Serve, MD Internal Medicine [place], [place] [address] Cell Phone (Monday-Friday 8 am - 5 pm): [phone] On Call: [phone] and follow prompts after 5 pm and on weekends Office Phone: [phone] Office Fax: [phone]

## 2017-06-29 NOTE — Patient Instructions (Addendum)
  Please apply ted hose to your legs during daytime and when you are moving around, remove it at bedtime.  Please make appointment to see a psychologist as soon as possible to help with your mood.  Stop taking B12 supplement

## 2017-07-07 ENCOUNTER — Telehealth: Payer: Self-pay | Admitting: *Deleted

## 2017-07-07 NOTE — Telephone Encounter (Signed)
Message left advising patient that Dr. Donell BeersPlovsky was psychiatrist in [place] that would accept medicare. Gave patient his info per DPR on file, and asked that she return my call and advise that she received my message.

## 2017-07-13 ENCOUNTER — Telehealth: Payer: Self-pay | Admitting: Oncology

## 2017-07-13 NOTE — Telephone Encounter (Signed)
Spoke with patient regarding appointment info

## 2017-07-25 ENCOUNTER — Telehealth: Payer: Self-pay | Admitting: *Deleted

## 2017-07-25 NOTE — Telephone Encounter (Signed)
Called patient to remind her of her New Patient appointment with Dr. Clelia CroftShadad on 07/27/17 at 2:00 pm. She verbalized understanding.

## 2017-07-27 ENCOUNTER — Telehealth: Payer: Self-pay | Admitting: Oncology

## 2017-07-27 ENCOUNTER — Ambulatory Visit (HOSPITAL_BASED_OUTPATIENT_CLINIC_OR_DEPARTMENT_OTHER): Payer: Medicare Other | Admitting: Oncology

## 2017-07-27 VITALS — BP 158/53 | HR 76 | Temp 97.6°F | Resp 20 | Ht 64.0 in | Wt 160.7 lb

## 2017-07-27 DIAGNOSIS — Z801 Family history of malignant neoplasm of trachea, bronchus and lung: Secondary | ICD-10-CM | POA: Diagnosis not present

## 2017-07-27 DIAGNOSIS — Z87891 Personal history of nicotine dependence: Secondary | ICD-10-CM | POA: Diagnosis not present

## 2017-07-27 DIAGNOSIS — F418 Other specified anxiety disorders: Secondary | ICD-10-CM

## 2017-07-27 DIAGNOSIS — D649 Anemia, unspecified: Secondary | ICD-10-CM | POA: Diagnosis not present

## 2017-07-27 DIAGNOSIS — D696 Thrombocytopenia, unspecified: Secondary | ICD-10-CM | POA: Diagnosis not present

## 2017-07-27 DIAGNOSIS — M810 Age-related osteoporosis without current pathological fracture: Secondary | ICD-10-CM | POA: Diagnosis not present

## 2017-07-27 DIAGNOSIS — D508 Other iron deficiency anemias: Secondary | ICD-10-CM

## 2017-07-27 NOTE — Progress Notes (Signed)
Reason for Referral: Thrombocytopenia  HPI: 84 year old woman currently of [place] where she resides in the senior living facility.  She currently lives in the independent living where she manages reasonably fair.  She ambulates with the help of a walker without any recent falls or syncope.  She does have history of anxiety and depression as well as osteoporosis and osteoarthritis.  She was noted to have mild cytopenias on physical in November 2018.  Her white cell count was 2.9, hemoglobin of 10.3 with a platelet count of 76.  Her differential was normal.  Her previous CBC showed normal white cell count in June 2018 and a hemoglobin of 10.5.  Her platelet count have fluctuated as low as 79,000 in February 2018 to a high of 106 in March 2018.  He was hospitalized in June 2018 with a platelet count at the time as low as 72,000.  Clinically she is asymptomatic from these findings.  She does report some fatigue and tiredness but no bleeding noted.  She denies any hematochezia, melena or epistaxis.  She does have issues with mood disorder and have been on Cymbalta in the past and discontinued in the last few months.  She does have Wellbutrin which she has not taken recently.  She does not report any headaches, blurry vision, syncope or seizures.  She does not report any fevers, chills or sweats.  She does not report any cough, wheezing or hemoptysis.  She does not report any chest pain, palpitation, orthopnea or leg edema.  She does not report any nausea, vomiting, abdominal pain, constipation or diarrhea.  She does not report any frequency urgency or hesitancy.  She does not up any skeletal complaints.  Remaining review of systems unremarkable.  Past Medical History:  Diagnosis Date  . Anemia   . Anxiety   . Chronotropic incompetence 12/2012    potentially medication related; noted on CPET   . DDD (degenerative disc disease)   . Eczema   . GERD (gastroesophageal reflux disease)   . H/O hiatal  hernia   . Hx: UTI (urinary tract infection)   . Hyperlipidemia   . Hypertension   . Hypothyroidism   . Osteopenia   . Septicemia ([place]) 2002   following UTI  . Vertigo, benign positional   :  Past Surgical History:  Procedure Laterality Date  . BREAST SURGERY     left biopsy  . CPET / MET - PFTS     Consistent with chronotropic incompetence; See attached report in the results section  . DILATION AND CURETTAGE OF UTERUS     x 2  . DOPPLER ECHOCARDIOGRAPHY  01/10/2013   Normal LV size and function. Normal EF. Air sclerosis but no stenosis  . ESOPHAGOGASTRODUODENOSCOPY  05/04/2012   Procedure: ESOPHAGOGASTRODUODENOSCOPY (EGD);  Surgeon: Inda Castle, MD;  Location: [facility];  Service: Endoscopy;  Laterality: N/A;  . EYE SURGERY     cataract extraction with ILO  ? eye  . IR KYPHO EA ADDL LEVEL THORACIC OR LUMBAR  10/28/2016  . IR KYPHO LUMBAR INC FX REDUCE BONE BX UNI/BIL CANNULATION INC/IMAGING  10/28/2016  . IR KYPHO THORACIC WITH BONE BIOPSY  12/24/2016  . IR RADIOLOGIST EVAL & MGMT  11/11/2016  . KNEE ARTHROSCOPY  2011   right  . TONSILLECTOMY    . TOTAL KNEE ARTHROPLASTY  04/24/2012   Procedure: TOTAL KNEE ARTHROPLASTY;  Surgeon: Gearlean Alf, MD;  Location: [facility];  Service: Orthopedics;  Laterality: Right;  :   Current  Outpatient Medications:  .  acetaminophen (TYLENOL) 325 MG tablet, Take 650 mg by mouth every 6 (six) hours as needed for mild pain. , Disp: , Rfl:  .  ALPRAZolam (XANAX) 0.25 MG tablet, Take 0.25 mg by mouth at bedtime as needed for anxiety. , Disp: , Rfl:  .  amLODipine (NORVASC) 10 MG tablet, Take 1 tablet (10 mg total) by mouth daily., Disp: , Rfl:  .  aspirin EC 81 MG tablet, Take 81 mg by mouth daily., Disp: , Rfl:  .  buPROPion (WELLBUTRIN) 100 MG tablet, Take 1 tablet (100 mg total) by mouth 2 (two) times daily., Disp: 60 tablet, Rfl: 3 .  calcium carbonate (OS-CAL) 600 MG TABS, Take 600 mg by mouth 2 (two) times daily with a meal., Disp: , Rfl:   .  cholecalciferol (VITAMIN D) 1000 UNITS tablet, Take 1,000 Units by mouth daily., Disp: , Rfl:  .  Cranberry 12600 MG CAPS, Take 12,600 mg by mouth as needed., Disp: , Rfl:  .  diphenhydramine-acetaminophen (TYLENOL PM) 25-500 MG TABS tablet, Take 1 tablet by mouth at bedtime as needed. , Disp: , Rfl:  .  fish oil-omega-3 fatty acids 1000 MG capsule, Take 1 g by mouth daily., Disp: , Rfl:  .  furosemide (LASIX) 20 MG tablet, Take 20 mg by mouth as needed for fluid or edema. , Disp: , Rfl:  .  levothyroxine (SYNTHROID, LEVOTHROID) 75 MCG tablet, Take 1 tablet (75 mcg total) by mouth daily before breakfast., Disp: 30 tablet, Rfl: 0 .  losartan (COZAAR) 100 MG tablet, TAKE 1 TABLET BY MOUTH  DAILY, Disp: 90 tablet, Rfl: 1 .  Multiple Vitamin (MULTIVITAMIN WITH MINERALS) TABS tablet, Take 1 tablet by mouth daily., Disp: , Rfl:  .  nitrofurantoin (MACRODANTIN) 50 MG capsule, Take 50 mg by mouth daily., Disp: , Rfl:  .  omeprazole (PRILOSEC) 40 MG capsule, Take 40 mg by mouth daily., Disp: , Rfl:  .  senna-docusate (SENOKOT-S) 8.6-50 MG tablet, Take 2 tablets by mouth at bedtime., Disp: 60 tablet, Rfl: 0 .  TYMLOS 3120 MCG/1.56ML SOPN, Inject 80 mcg as directed daily. , Disp: , Rfl: :  No Known Allergies:  Family History  Problem Relation Age of Onset  . Lung cancer Father 47       Died at age 106  . Stroke Mother 31       Died in her late 39s.  Marland Kitchen Heart attack Mother 71  . Diabetes Mother   . Arthritis Mother   :  Social History   Socioeconomic History  . Marital status: Widowed    Spouse name: [name]  . Number of children: 3  . Years of education: Not on file  . Highest education level: Not on file  Social Needs  . Financial resource strain: Not on file  . Food insecurity - worry: Not on file  . Food insecurity - inability: Not on file  . Transportation needs - medical: Not on file  . Transportation needs - non-medical: Not on file  Occupational History  . Occupation: homemaker   Tobacco Use  . Smoking status: Former Smoker    Years: 0.00    Types: Cigarettes    Last attempt to quit: 05/03/1965    Years since quitting: 52.2  . Smokeless tobacco: Never Used  Substance and Sexual Activity  . Alcohol use: No    Comment: occ glass wine  . Drug use: No  . Sexual activity: Not on file  Other Topics  Concern  . Not on file  Social History Narrative   She is a widowed mother of 3.   She is a former smoker, with a distant history having quit in 1966.   She does drink an occasional glass of red wine.   She is just now started to try doing exercise with water aerobics.  :  Pertinent items are noted in HPI.  Exam: Blood pressure (!) 158/53, pulse 76, temperature 97.6 F (36.4 C), temperature source Oral, resp. rate 20, height '5\' 4"'  (1.626 m), weight 160 lb 11.2 oz (72.9 kg), SpO2 96 %.  ECOG 2 General appearance: alert and cooperative appeared without distress. Throat: That oral thrush or ulcers. Neck: no adenopathy or neck masses. Resp: clear to auscultation bilaterally without rhonchi or wheezes. Cardio: regular rate and rhythm, S1, S2 normal, no murmur, click, rub or gallop GI: soft, non-tender; bowel sounds normal; no masses,  no organomegaly Extremities: extremities normal, atraumatic, no cyanosis or edema  CBC    Component Value Date/Time   WBC 2.9 (L) 06/23/2017 0810   RBC 3.58 (L) 06/23/2017 0810   HGB 10.3 (L) 06/23/2017 0810   HCT 30.6 (L) 06/23/2017 0810   PLT 76 (L) 06/23/2017 0810   MCV 85.5 06/23/2017 0810   MCH 28.8 06/23/2017 0810   MCHC 33.7 06/23/2017 0810   RDW 15.0 06/23/2017 0810   LYMPHSABS 977 06/23/2017 0810   MONOABS 1.0 12/26/2016 0513   EOSABS 81 06/23/2017 0810   BASOSABS 29 06/23/2017 0810     Assessment and Plan:   84 year old woman with the following issues:  1.  Thrombocytopenia: Dates back to 2013 with a platelet count of 120.  She had fluctuating counts during that time with a decline a platelet count of 79,000  in February 2018 and as high as 106 in March 2018.  Her most recent platelet count in June 23, 2017 was 76.  The differential diagnosis was discussed today which include a reactive thrombocytopenia versus medication side effect.  Primary hematological disorder could also be a possibility given her mild anemia and mild leukocytopenia.  MDS is also a consideration among others.  From a management standpoint, given the fact that she is asymptomatic and no clear-cut medication has correlated with her thrombocytopenia, I have recommended continued observation and surveillance.  We will monitor platelet counts closely and consider bone marrow biopsy if her cytopenias persist.  2.  Mild anemia: Appears to be multifactorial in nature with element of chronic disease versus other causes.  Myelodysplasia could also be a possibility given her other cytopenias.  3.  Follow-up: We will be in the next 3 months to repeat her counts and monitor closely.

## 2017-07-27 NOTE — Telephone Encounter (Signed)
Gave patient avs and calendar with appts per 1/2 los.  °

## 2017-08-01 ENCOUNTER — Non-Acute Institutional Stay: Payer: Medicare Other | Admitting: Internal Medicine

## 2017-08-01 ENCOUNTER — Encounter: Payer: Self-pay | Admitting: Internal Medicine

## 2017-08-01 VITALS — BP 134/70 | HR 65 | Temp 97.6°F | Resp 16 | Ht 64.0 in | Wt 159.0 lb

## 2017-08-01 DIAGNOSIS — F339 Major depressive disorder, recurrent, unspecified: Secondary | ICD-10-CM | POA: Insufficient documentation

## 2017-08-01 DIAGNOSIS — F321 Major depressive disorder, single episode, moderate: Secondary | ICD-10-CM

## 2017-08-01 DIAGNOSIS — F320 Major depressive disorder, single episode, mild: Secondary | ICD-10-CM | POA: Insufficient documentation

## 2017-08-01 NOTE — Progress Notes (Signed)
[name] [name]  Provider: Blanchie Serve MD   Location:  [facility] of Service:  [name] (12)  PCP: Blanchie Serve, MD Patient Care Team: Blanchie Serve, MD as PCP - General (Internal Medicine)  Extended Emergency Contact Information Primary Emergency Contact: Chriscoe,[name] Address: [place], [place] [address] [address]nette Litter of [place] Phone: [phone] Mobile Phone: [phone] Relation: Son Secondary Emergency Contact: Lottes,[name]  [place] Phone: [phone] Relation: None   Goals of Care: Advanced Directive information Advanced Directives 12/22/2016  Does Patient Have a Medical Advance Directive? Yes  Type of Advance Directive Living will  Does patient want to make changes to medical advance directive? No - Patient declined  Copy of [place] in Chart? -      Chief Complaint  Patient presents with  . Acute Visit    follow up on depression  . Medication Refill    No refills needed at this time.     HPI: Patient is a 84 y.o. female seen today for acute visit for follow up on depression. She feels tired all the time and has chronic ongoing back pain. She feels sad. She has taken 2-3 days worth of wellbutrin since last visit. She had episode of depression several years back and needed hospitalization. She does not feel this is depression. She feels sad because she thinks people are talking about her and they dislike her. She mentions being agonised with a mistake she commited 40 years back. She had been jealous about her husband 40 years back thinking that he had something going on with one of their family friend. She now feels he was a gentleman and never cheated on her. She is crying while telling this story. She then had people in the church start a rumor about her husband and this friend. She is not able to forgive herself about this because she thinks it was the way she acted out  that everyone felt and understood wrong things about her. She feels that her friends here at independent living facility know about this and thus dislike her. When asked further, no one has mentioned this to her or confronted her with such accusations. She denies seeing or hearing things/ voices that are not there. On further review, she has not seen a psychologist.   Past Medical History:  Diagnosis Date  . Anemia   . Anxiety   . Chronotropic incompetence 12/2012    potentially medication related; noted on CPET   . DDD (degenerative disc disease)   . Eczema   . GERD (gastroesophageal reflux disease)   . H/O hiatal hernia   . Hx: UTI (urinary tract infection)   . Hyperlipidemia   . Hypertension   . Hypothyroidism   . Osteopenia   . Septicemia ([place]) 2002   following UTI  . Vertigo, benign positional    Past Surgical History:  Procedure Laterality Date  . BREAST SURGERY     left biopsy  . CPET / MET - PFTS     Consistent with chronotropic incompetence; See attached report in the results section  . DILATION AND CURETTAGE OF UTERUS     x 2  . DOPPLER ECHOCARDIOGRAPHY  01/10/2013   Normal LV size and function. Normal EF. Air sclerosis but no stenosis  . ESOPHAGOGASTRODUODENOSCOPY  05/04/2012   Procedure: ESOPHAGOGASTRODUODENOSCOPY (EGD);  Surgeon: Inda Castle, MD;  Location:  [facility];  Service: Endoscopy;  Laterality: N/A;  . EYE SURGERY     cataract extraction with ILO  ? eye  . IR KYPHO EA ADDL LEVEL THORACIC OR LUMBAR  10/28/2016  . IR KYPHO LUMBAR INC FX REDUCE BONE BX UNI/BIL CANNULATION INC/IMAGING  10/28/2016  . IR KYPHO THORACIC WITH BONE BIOPSY  12/24/2016  . IR RADIOLOGIST EVAL & MGMT  11/11/2016  . KNEE ARTHROSCOPY  2011   right  . TONSILLECTOMY    . TOTAL KNEE ARTHROPLASTY  04/24/2012   Procedure: TOTAL KNEE ARTHROPLASTY;  Surgeon: Gearlean Alf, MD;  Location: [facility];  Service: Orthopedics;  Laterality: Right;    reports that she quit smoking about 52 years ago.  Her smoking use included cigarettes. She quit after 0.00 years of use. she has never used smokeless tobacco. She reports that she does not drink alcohol or use drugs. Social History   Socioeconomic History  . Marital status: Widowed    Spouse name: [name]  . Number of children: 3  . Years of education: Not on file  . Highest education level: Not on file  Social Needs  . Financial resource strain: Not on file  . Food insecurity - worry: Not on file  . Food insecurity - inability: Not on file  . Transportation needs - medical: Not on file  . Transportation needs - non-medical: Not on file  Occupational History  . Occupation: homemaker  Tobacco Use  . Smoking status: Former Smoker    Years: 0.00    Types: Cigarettes    Last attempt to quit: 05/03/1965    Years since quitting: 52.2  . Smokeless tobacco: Never Used  Substance and Sexual Activity  . Alcohol use: No    Comment: occ glass wine  . Drug use: No  . Sexual activity: Not on file  Other Topics Concern  . Not on file  Social History Narrative   She is a widowed mother of 3.   She is a former smoker, with a distant history having quit in 1966.   She does drink an occasional glass of red wine.   She is just now started to try doing exercise with water aerobics.     Family History  Problem Relation Age of Onset  . Lung cancer Father 73       Died at age 13  . Stroke Mother 70       Died in her late 28s.  Marland Kitchen Heart attack Mother 74  . Diabetes Mother   . Arthritis Mother     Health Maintenance  Topic Date Due  . Samul Dada  07/31/1949  . DEXA SCAN  07/30/1997  . PNA vac Low Risk Adult (1 of 2 - PCV13) 07/30/1997  . INFLUENZA VACCINE  Completed    No Known Allergies  Outpatient Encounter Medications as of 08/01/2017  Medication Sig  . acetaminophen (TYLENOL) 325 MG tablet Take 650 mg by mouth every 6 (six) hours as needed for mild pain.   Marland Kitchen ALPRAZolam (XANAX) 0.25 MG tablet Take 0.25 mg by mouth at bedtime as  needed for anxiety.   Marland Kitchen amLODipine (NORVASC) 10 MG tablet Take 1 tablet (10 mg total) by mouth daily.  Marland Kitchen aspirin EC 81 MG tablet Take 81 mg by mouth daily.  Marland Kitchen buPROPion (WELLBUTRIN) 100 MG tablet Take 1 tablet (100 mg total) by mouth 2 (two) times daily.  . calcium carbonate (OS-CAL) 600 MG TABS Take 600 mg by mouth 2 (two) times daily  with a meal.  . cholecalciferol (VITAMIN D) 1000 UNITS tablet Take 1,000 Units by mouth daily.  . Cranberry 12600 MG CAPS Take 12,600 mg by mouth as needed.  . diphenhydramine-acetaminophen (TYLENOL PM) 25-500 MG TABS tablet Take 1 tablet by mouth at bedtime as needed.   . fish oil-omega-3 fatty acids 1000 MG capsule Take 1 g by mouth daily.  . furosemide (LASIX) 20 MG tablet Take 20 mg by mouth as needed for fluid or edema.   Marland Kitchen levothyroxine (SYNTHROID, LEVOTHROID) 75 MCG tablet Take 1 tablet (75 mcg total) by mouth daily before breakfast.  . losartan (COZAAR) 100 MG tablet TAKE 1 TABLET BY MOUTH  DAILY  . Multiple Vitamin (MULTIVITAMIN WITH MINERALS) TABS tablet Take 1 tablet by mouth daily.  . nitrofurantoin (MACRODANTIN) 50 MG capsule Take 50 mg by mouth daily.  Marland Kitchen omeprazole (PRILOSEC) 40 MG capsule Take 40 mg by mouth daily.  . sennosides-docusate sodium (SENOKOT-S) 8.6-50 MG tablet Take 2 tablets by mouth daily as needed for constipation.  . TYMLOS 3120 MCG/1.56ML SOPN Inject 80 mcg as directed daily.   . [DISCONTINUED] senna-docusate (SENOKOT-S) 8.6-50 MG tablet Take 2 tablets by mouth at bedtime. (Patient not taking: Reported on 08/01/2017)   No facility-administered encounter medications on file as of 08/01/2017.     Review of Systems  Constitutional: Positive for fatigue. Negative for appetite change and fever.  HENT: Negative for congestion and trouble swallowing.   Respiratory: Negative for shortness of breath.   Cardiovascular: Negative for chest pain.  Musculoskeletal: Positive for back pain and gait problem.  Psychiatric/Behavioral: Positive for  dysphoric mood. Negative for agitation, behavioral problems and hallucinations. The patient is nervous/anxious.     Vitals:   08/01/17 1334  BP: 134/70  Pulse: 65  Resp: 16  Temp: 97.6 F (36.4 C)  TempSrc: Oral  SpO2: 98%  Weight: 159 lb (72.1 kg)  Height: _0  (1.626 m)   Body mass index is 27.29 kg/m. Physical Exam  Constitutional: She is oriented to person, place, and time. She appears well-developed and well-nourished. No distress.  HENT:  Head: Normocephalic and atraumatic.  Mouth/Throat: Oropharynx is clear and moist.  Eyes: Conjunctivae are normal.  Neck: Neck supple.  Cardiovascular: Normal rate and regular rhythm.  Pulmonary/Chest: Effort normal and breath sounds normal.  Abdominal: Soft. Bowel sounds are normal.  Lymphadenopathy:    She has no cervical adenopathy.  Neurological: She is alert and oriented to person, place, and time.  Skin: Skin is warm and dry. She is not diaphoretic.  Psychiatric:  Anxious and tearful, sad     Labs reviewed: Basic Metabolic Panel: Recent Labs    12/23/16 0516  12/25/16 0604 12/26/16 0513 06/23/17 0810  NA 132*   < > 132* 131* 132*  K 4.5   < > 4.2 4.3 4.7  CL 97*   < > 96* 95* 96*  CO2 26   < > _1 GLUCOSE 106*   < > 96 100* 84  BUN 15   < > 13 18 29*  CREATININE 0.55   < > 0.53 0.51 0.66  CALCIUM 9.6   < > 9.3 9.3 10.1  MG 2.0  --   --  2.1  --    < > = values in this interval not displayed.   Liver Function Tests: Recent Labs    09/06/16 0234 12/21/16 2022 06/23/17 0810  AST 45* 37 38*  ALT _2 ALKPHOS 68 103  --  BILITOT 0.7 0.5 0.4  PROT 7.6 7.7 7.1  ALBUMIN 4.4 4.6  --    Recent Labs    09/06/16 0234  LIPASE 15   No results for input(s): AMMONIA in the last 8760 hours. CBC: Recent Labs    12/24/16 0508 12/26/16 0513 06/23/17 0810  WBC 4.9 3.0* 2.9*  NEUTROABS 2.4 1.4* 1,018*  HGB 10.5* 10.1* 10.3*  HCT 31.2* 30.3* 30.6*  MCV 85.7 84.9 85.5  PLT 72* 77* 76*   Cardiac  Enzymes: No results for input(s): CKTOTAL, CKMB, CKMBINDEX, TROPONINI in the last 8760 hours. BNP: Invalid input(s): POCBNP No results found for: HGBA1C Lab Results  Component Value Date   TSH 4.11 06/23/2017   Lab Results  Component Value Date   VITAMINB12 >2,000 (H) 06/23/2017   Lab Results  Component Value Date   FOLATE 17.3 05/03/2012   Lab Results  Component Value Date   IRON 48 05/03/2012   TIBC 262 05/03/2012   FERRITIN 746 (H) 05/03/2012    Lipid Panel: Recent Labs    06/23/17 0810  CHOL 205*  HDL 95  TRIG 63  CHOLHDL 2.2     Assessment/Plan  1. Depression, major, single episode, moderate ([place]) Advised on taking wellbutrin 100 mg bid for now, counselled on her symptoms of depression and how to stay positive. counselled on keeping herself surrounded with friends that she feels are her good friends. She agrees to try and also to take medications routinely. She mentions that she does not need a refill as she might have taken 2-3 days worth of medication only. Reassess in 3 weeks. Encouraged to establish with psychologist for counselling.   Depression screen PHQ 2/9 08/01/2017  Decreased Interest 3  Down, Depressed, Hopeless 3  PHQ - 2 Score 6  Altered sleeping 1  Tired, decreased energy 1  Change in appetite 0  Feeling bad or failure about yourself  3  Trouble concentrating 0  Moving slowly or fidgety/restless 0  Suicidal thoughts 0  PHQ-9 Score 11  Difficult doing work/chores Very difficult    Labs/tests ordered:  None  Next appointment: 3 weeks or earlier if needed  Communication: reviewed care plan with patient     Blanchie Serve, MD Internal Medicine [facility] [place], [place] [address] Cell Phone (Monday-Friday 8 am - 5 pm): [phone] On Call: [phone] and follow prompts after 5 pm and on weekends Office Phone: [phone] Office Fax: [phone]

## 2017-08-01 NOTE — Patient Instructions (Signed)
I need you to take wellbutrin 100 mg twice a day daily until I see you next time for your mood.

## 2017-08-11 ENCOUNTER — Telehealth: Payer: Self-pay | Admitting: *Deleted

## 2017-08-11 NOTE — Telephone Encounter (Signed)
Carilyn GoodpastureDinah- will you write this order, please? Thanks

## 2017-08-11 NOTE — Telephone Encounter (Signed)
Ok to provide. Can [name] wirte this? If not, I can do it tomorrow.

## 2017-08-11 NOTE — Telephone Encounter (Signed)
Patient called [facility]aine [facility]he[facility] the PT. She said she did this a couple of times with him before and it really helped. She said she has talked to you about her back pain before. She would like to go at least 2x/wk if possible.

## 2017-08-12 NOTE — Telephone Encounter (Signed)
Script given to [place] [facility]. She will handle the order for patient.

## 2017-08-16 ENCOUNTER — Other Ambulatory Visit: Payer: Self-pay | Admitting: Internal Medicine

## 2017-08-16 NOTE — Telephone Encounter (Signed)
A refill request was received from pharmacy for alprazolam 0.25 mg tablet. This medication has not been refilled since 08/16/16. Please advise if it is ok to refill.

## 2017-08-22 ENCOUNTER — Encounter: Payer: Self-pay | Admitting: Internal Medicine

## 2017-08-22 ENCOUNTER — Non-Acute Institutional Stay: Payer: Medicare Other | Admitting: Internal Medicine

## 2017-08-22 VITALS — BP 128/74 | HR 67 | Temp 97.5°F | Resp 16 | Ht 64.0 in | Wt 154.0 lb

## 2017-08-22 DIAGNOSIS — F321 Major depressive disorder, single episode, moderate: Secondary | ICD-10-CM

## 2017-08-22 DIAGNOSIS — M549 Dorsalgia, unspecified: Secondary | ICD-10-CM | POA: Diagnosis not present

## 2017-08-22 DIAGNOSIS — R350 Frequency of micturition: Secondary | ICD-10-CM

## 2017-08-22 DIAGNOSIS — G8929 Other chronic pain: Secondary | ICD-10-CM | POA: Diagnosis not present

## 2017-08-22 MED ORDER — BUPROPION HCL 100 MG PO TABS: 150.0000 mg | ORAL_TABLET | Freq: Two times a day (BID) | ORAL | 3 refills | Status: DC

## 2017-08-22 MED ORDER — ACETAMINOPHEN 500 MG PO TABS: 1000.0000 mg | ORAL_TABLET | Freq: Two times a day (BID) | ORAL | 0 refills | Status: DC

## 2017-08-22 MED ORDER — LIDOCAINE 5 % EX PTCH: 1.0000 | MEDICATED_PATCH | CUTANEOUS | 0 refills | Status: DC

## 2017-08-22 MED ORDER — ACETAMINOPHEN 500 MG PO TABS: 1000.0000 mg | ORAL_TABLET | Freq: Four times a day (QID) | ORAL | 0 refills | Status: AC

## 2017-08-22 NOTE — Patient Instructions (Addendum)
  Your wellbutrin dosing has been increased to 150 mg twice daily- so take one and a half tablet twice daily for now. I will see you back in 3 weeks.    Major Depressive Disorder, Adult Major depressive disorder (MDD) is a mental health condition. MDD often makes you feel sad, hopeless, or helpless. MDD can also cause symptoms in your body. MDD can affect your:  Work.  School.  Relationships.  Other normal activities.  MDD can range from mild to very bad. It may occur once (single episode MDD). It can also occur many times (recurrent MDD). The main symptoms of MDD often include:  Feeling sad, depressed, or irritable most of the time.  Loss of interest.  MDD symptoms also include:  Sleeping too much or too little.  Eating too much or too little.  A change in your weight.  Feeling tired (fatigue) or having low energy.  Feeling worthless.  Feeling guilty.  Trouble making decisions.  Trouble thinking clearly.  Thoughts of suicide or harming others.  Feeling weak.  Feeling agitated.  Keeping yourself from being around other people (isolation).  Follow these instructions at home: Activity  Do these things as told by your doctor: ? Go back to your normal activities. ? Exercise regularly. ? Spend time outdoors. Alcohol  Talk with your doctor about how alcohol can affect your antidepressant medicines.  Do not drink alcohol. Or, limit how much alcohol you drink. ? This means no more than 1 drink a day for nonpregnant women and 2 drinks a day for men. One drink equals one of these:  12 oz of beer.  5 oz of wine.  1 oz of hard liquor. General instructions  Take over-the-counter and prescription medicines only as told by your doctor.  Eat a healthy diet.  Get plenty of sleep.  Find activities that you enjoy. Make time to do them.  Think about joining a support group. Your doctor may be able to suggest a group for you.  Keep all follow-up visits as  told by your doctor. This is important. Where to find more information:  The [employer] on Mental Illness: ? www.nami.org  U[employer] of Mental Health: ? http://www.maynard.net/www.nimh.nih.gov  National Suicide Prevention Lifeline: ? 306-151-17741-307-524-2413. This is free, 24-hour help. Contact a doctor if:  Your symptoms get worse.  You have new symptoms. Get help right away if:  You self-harm.  You see, hear, taste, smell, or feel things that are not present (hallucinate). If you ever feel like you may hurt yourself or others, or have thoughts about taking your own life, get help right away. You can go to your nearest emergency department or call:  Your local emergency services (911 in the [place]  A suicide crisis helpline, such as the National Suicide Prevention Lifeline: ? 510-602-44961-307-524-2413. This is open 24 hours a day.  This information is not intended to replace advice given to you by your health care provider. Make sure you discuss any questions you have with your health care provider. Document Released: 06/23/2015 Document Revised: 03/28/2016 Document Reviewed: 03/28/2016 Elsevier Interactive Patient Education  2017 Elsevier Inc.   Please see a psychologist, this can help your mood.  Take lidocaine patch for your back pain. Continue to take your tylenol  Bring all your medications with you on your next visit please.

## 2017-08-22 NOTE — Progress Notes (Signed)
[facility]  Provider: Blanchie Serve MD   Location:  [place] [facility] Service:  Clinic (12)  PCP: Blanchie Serve, MD Patient Care Team: Blanchie Serve, MD as PCP - General (Internal Medicine)  Extended Emergency Contact Information Primary Emergency Contact: Alberty,[name] Address: [place], [place] [address] [place] of [place] Phone: [phone] Mobile Phone: [phone] Relation: Son Secondary Emergency Contact: Mino,[name]  [place] Phone: [phone] Relation: None   Goals of Care: Advanced Directive information Advanced Directives 12/22/2016  Does Patient Have a Medical Advance Directive? Yes  Type of Advance Directive Living will  Does patient want to make changes to medical advance directive? No - Patient declined  Copy of [place] in Chart? -      Chief Complaint  Patient presents with  . Acute Visit    Follow up on depression. Patient stated that she doesn't feel good in terms of mood. Patient stated that she had some urinary frequency and chills. Per daughter in law her back pain has gotten worse and wants to talk about using the Fentayl patch.  Lidocaine patch seems to help with the pain.   . Medication Refill    No refills needed at this time    HPI: Patient is a 84 y.o. female seen today for follow up on her depression. She was seen on 08/01/17 for depression and fatigue with chronic back pain. She was placed on wellbutrin 100 mg bid and was advised to see a psychologist. She is also on alprazolam 0.25 mg daily as needed. She is seen today with her daughter in law present. She has been taking wellbutrin bid as prescribed. She has started lidocaine patch daily for back pain. She had been prescribed fentanyl patch in summer of 2018 by her prior PCP but has not required to use it. She has been working with therapy team here. She has not taken alprazolam recently. She is  crying this visit.   Past Medical History:  Diagnosis Date  . Anemia   . Anxiety   . Chronotropic incompetence 12/2012    potentially medication related; noted on CPET   . DDD (degenerative disc disease)   . Eczema   . GERD (gastroesophageal reflux disease)   . H/O hiatal hernia   . Hx: UTI (urinary tract infection)   . Hyperlipidemia   . Hypertension   . Hypothyroidism   . Osteopenia   . Septicemia ([place]) 2002   following UTI  . Vertigo, benign positional    Past Surgical History:  Procedure Laterality Date  . BREAST SURGERY     left biopsy  . CATARACT EXTRACTION Right    2 weeks ago  . CPET / MET - PFTS     Consistent with chronotropic incompetence; See attached report in the results section  . DILATION AND CURETTAGE OF UTERUS     x 2  . DOPPLER ECHOCARDIOGRAPHY  01/10/2013   Normal LV size and function. Normal EF. Air sclerosis but no stenosis  . ESOPHAGOGASTRODUODENOSCOPY  05/04/2012   Procedure: ESOPHAGOGASTRODUODENOSCOPY (EGD);  Surgeon: Inda Castle, MD;  Location: [facility];  Service: Endoscopy;  Laterality: N/A;  . EYE SURGERY     cataract extraction with ILO  ? eye  . IR KYPHO EA ADDL LEVEL THORACIC OR LUMBAR  10/28/2016  . IR KYPHO LUMBAR INC FX REDUCE BONE BX UNI/BIL CANNULATION INC/IMAGING  10/28/2016  . IR KYPHO THORACIC WITH BONE BIOPSY  12/24/2016  . IR RADIOLOGIST EVAL & MGMT  11/11/2016  . KNEE ARTHROSCOPY  2011   right  . TONSILLECTOMY    . TOTAL KNEE ARTHROPLASTY  04/24/2012   Procedure: TOTAL KNEE ARTHROPLASTY;  Surgeon: Gearlean Alf, MD;  Location: [facility];  Service: Orthopedics;  Laterality: Right;    reports that she quit smoking about 52 years ago. Her smoking use included cigarettes. She quit after 0.00 years of use. she has never used smokeless tobacco. She reports that she does not drink alcohol or use drugs. Social History   Socioeconomic History  . Marital status: Widowed    Spouse name: [name]  . Number of children: 3  . Years of  education: Not on file  . Highest education level: Not on file  Social Needs  . Financial resource strain: Not on file  . Food insecurity - worry: Not on file  . Food insecurity - inability: Not on file  . Transportation needs - medical: Not on file  . Transportation needs - non-medical: Not on file  Occupational History  . Occupation: homemaker  Tobacco Use  . Smoking status: Former Smoker    Years: 0.00    Types: Cigarettes    Last attempt to quit: 05/03/1965    Years since quitting: 52.3  . Smokeless tobacco: Never Used  Substance and Sexual Activity  . Alcohol use: No    Comment: occ glass wine  . Drug use: No  . Sexual activity: Not on file  Other Topics Concern  . Not on file  Social History Narrative   She is a widowed mother of 3.   She is a former smoker, with a distant history having quit in 1966.   She does drink an occasional glass of red wine.   She is just now started to try doing exercise with water aerobics.    Functional Status Survey:    Family History  Problem Relation Age of Onset  . Lung cancer Father 55       Died at age 50  . Stroke Mother 35       Died in her late 32s.  Marland Kitchen Heart attack Mother 60  . Diabetes Mother   . Arthritis Mother     Health Maintenance  Topic Date Due  . Samul Dada  07/31/1949  . DEXA SCAN  07/30/1997  . PNA vac Low Risk Adult (1 of 2 - PCV13) 07/30/1997  . INFLUENZA VACCINE  Completed    No Known Allergies  Outpatient Encounter Medications as of 08/22/2017  Medication Sig  . acetaminophen (TYLENOL) 325 MG tablet Take 650 mg by mouth every 6 (six) hours as needed for mild pain.   Marland Kitchen ALPRAZolam (XANAX) 0.25 MG tablet TAKE 1 TABLET BY MOUTH EVERY DAY AS NEEDED  . amLODipine (NORVASC) 10 MG tablet Take 1 tablet (10 mg total) by mouth daily.  Marland Kitchen aspirin EC 81 MG tablet Take 81 mg by mouth daily.  Marland Kitchen buPROPion (WELLBUTRIN) 100 MG tablet Take 1 tablet (100 mg total) by mouth 2 (two) times daily.  . calcium carbonate  (OS-CAL) 600 MG TABS Take 600 mg by mouth 2 (two) times daily with a meal.  . cholecalciferol (VITAMIN D) 1000 UNITS tablet Take 1,000 Units by mouth daily.  . Cranberry 12600 MG CAPS Take 12,600 mg by mouth as needed.  . diphenhydramine-acetaminophen (TYLENOL PM) 25-500 MG TABS tablet Take 1 tablet by mouth at bedtime as  needed.   . fish oil-omega-3 fatty acids 1000 MG capsule Take 1 g by mouth daily.  . furosemide (LASIX) 20 MG tablet Take 20 mg by mouth as needed for fluid or edema.   Marland Kitchen levothyroxine (SYNTHROID, LEVOTHROID) 75 MCG tablet Take 1 tablet (75 mcg total) by mouth daily before breakfast.  . losartan (COZAAR) 100 MG tablet TAKE 1 TABLET BY MOUTH  DAILY  . Multiple Vitamin (MULTIVITAMIN WITH MINERALS) TABS tablet Take 1 tablet by mouth daily.  . nitrofurantoin (MACRODANTIN) 50 MG capsule Take 50 mg by mouth daily.  Marland Kitchen omeprazole (PRILOSEC) 40 MG capsule Take 40 mg by mouth daily.  . sennosides-docusate sodium (SENOKOT-S) 8.6-50 MG tablet Take 2 tablets by mouth daily as needed for constipation.  . TYMLOS 3120 MCG/1.56ML SOPN Inject 80 mcg as directed daily.    No facility-administered encounter medications on file as of 08/22/2017.     Review of Systems  Constitutional: Positive for fatigue. Negative for appetite change, chills, diaphoresis and fever.  Gastrointestinal: Positive for nausea. Negative for vomiting.  Genitourinary: Positive for frequency. Negative for difficulty urinating, dysuria, flank pain, hematuria and urgency.       Gets frequent UTI and had 2 episodes in last 9 months. Currently on prophylactic nitrofurantoin  Musculoskeletal: Positive for back pain and gait problem.  Skin: Negative for rash.  Neurological: Negative for dizziness and headaches.  Psychiatric/Behavioral: Positive for dysphoric mood. Negative for behavioral problems and sleep disturbance.    Vitals:   08/22/17 1345  BP: 128/74  Pulse: 67  Resp: 16  Temp: (!) 97.5 F (36.4 C)  TempSrc:  Oral  SpO2: 98%  Weight: 154 lb (69.9 kg)  Height: '5\' 4"'  (1.626 m)   Body mass index is 26.43 kg/m.   Wt Readings from Last 3 Encounters:  08/22/17 154 lb (69.9 kg)  08/01/17 159 lb (72.1 kg)  07/27/17 160 lb 11.2 oz (72.9 kg)   Physical Exam  Constitutional: She appears well-developed and well-nourished. No distress.  HENT:  Head: Normocephalic and atraumatic.  Mouth/Throat: Oropharynx is clear and moist.  Eyes: Conjunctivae and EOM are normal. Pupils are equal, round, and reactive to light. Right eye exhibits no discharge. Left eye exhibits no discharge.  Neck: Neck supple.  Cardiovascular: Normal rate and regular rhythm.  Pulmonary/Chest: Effort normal and breath sounds normal.  Abdominal: Soft. Bowel sounds are normal. She exhibits no distension. There is tenderness. There is no rebound and no guarding.  Musculoskeletal:  No spinal tenderness, unsteady gait, uses walker with seat  Lymphadenopathy:    She has no cervical adenopathy.  Neurological: She is alert.  Oriented to person, place and time.   Skin: Skin is warm and dry. No rash noted. She is not diaphoretic.  Psychiatric:  Tearful, flat affect, makes good eye contact    Labs reviewed: Basic Metabolic Panel: Recent Labs    12/23/16 0516  12/25/16 0604 12/26/16 0513 06/23/17 0810  NA 132*   < > 132* 131* 132*  K 4.5   < > 4.2 4.3 4.7  CL 97*   < > 96* 95* 96*  CO2 26   < > '28 28 28  ' GLUCOSE 106*   < > 96 100* 84  BUN 15   < > 13 18 29*  CREATININE 0.55   < > 0.53 0.51 0.66  CALCIUM 9.6   < > 9.3 9.3 10.1  MG 2.0  --   --  2.1  --    < > =  values in this interval not displayed.   Liver Function Tests: Recent Labs    09/06/16 0234 12/21/16 2022 06/23/17 0810  AST 45* 37 38*  ALT '29 23 27  ' ALKPHOS 68 103  --   BILITOT 0.7 0.5 0.4  PROT 7.6 7.7 7.1  ALBUMIN 4.4 4.6  --    Recent Labs    09/06/16 0234  LIPASE 15   No results for input(s): AMMONIA in the last 8760 hours. CBC: Recent Labs     12/24/16 0508 12/26/16 0513 06/23/17 0810  WBC 4.9 3.0* 2.9*  NEUTROABS 2.4 1.4* 1,018*  HGB 10.5* 10.1* 10.3*  HCT 31.2* 30.3* 30.6*  MCV 85.7 84.9 85.5  PLT 72* 77* 76*   Cardiac Enzymes: No results for input(s): CKTOTAL, CKMB, CKMBINDEX, TROPONINI in the last 8760 hours. BNP: Invalid input(s): POCBNP No results found for: HGBA1C Lab Results  Component Value Date   TSH 4.11 06/23/2017   Lab Results  Component Value Date   VITAMINB12 >2,000 (H) 06/23/2017   Lab Results  Component Value Date   FOLATE 17.3 05/03/2012   Lab Results  Component Value Date   IRON 48 05/03/2012   TIBC 262 05/03/2012   FERRITIN 746 (H) 05/03/2012    Lipid Panel: Recent Labs    06/23/17 0810  CHOL 205*  HDL 95  TRIG 63  CHOLHDL 2.2   No results found for: HGBA1C  Procedures since last visit: No results found.  Assessment/Plan  1. Urinary frequency Has frequency with urgency and subjective chills since yesterday. Has history of recurrent UTIs in past and is currently on nitrofurantoin prophylactically. Send u/a with c/s and cbc with diff to assess further. Defer antibiotic for now. Encourage hydration and perineal hygiene.  - Urinalysis with Reflex Microscopic and urine culture - BMP with eGFR([facility]); Future - CBC with Differential/Platelets; Future  2. Current moderate episode of major depressive disorder, unspecified whether recurrent (HCC) Increase wellbutrin to 150 mg bid and monitor her mood. Psychology referral.  - buPROPion (WELLBUTRIN) 100 MG tablet; Take 1.5 tablets (150 mg total) by mouth 2 (two) times daily.  Dispense: 90 tablet; Refill: 3  3. Chronic back pain, unspecified back location, unspecified back pain laterality Continue lidocaine patch for now. Advised to take tylenol extra strength 2 tablet bid. Lidocaine patch has been helpful. History of compression fracture and is s/p kyphoplasty and vertebroplasty in past. If no improvement, will consider tramadol.  Advised to work with PT team as tolerated.     Labs/tests ordered:  Cbc with diff, bmp, u/a with culture  Next appointment: 3 weeks  Communication: reviewed care plan with patient and her daughter in law    Blanchie Serve, MD Internal Medicine [address], [place] [address] Cell Phone (Monday-Friday 8 am - 5 pm): [phone] On Call: [phone] and follow prompts after 5 pm and on weekends Office Phone: [phone] Office Fax: [phone]

## 2017-08-23 ENCOUNTER — Other Ambulatory Visit: Payer: Self-pay | Admitting: Internal Medicine

## 2017-08-25 LAB — CBC WITH DIFFERENTIAL/PLATELET
BASOS PCT: 0.9 %
Basophils Absolute: 31 cells/uL (ref 0–200)
EOS ABS: 119 {cells}/uL (ref 15–500)
EOS PCT: 3.5 %
HCT: 31.6 % — ABNORMAL LOW (ref 35.0–45.0)
Hemoglobin: 10.4 g/dL — ABNORMAL LOW (ref 11.7–15.5)
Lymphs Abs: 510 cells/uL — ABNORMAL LOW (ref 850–3900)
MCH: 27.3 pg (ref 27.0–33.0)
MCHC: 32.9 g/dL (ref 32.0–36.0)
MCV: 82.9 fL (ref 80.0–100.0)
MONOS PCT: 22 %
MPV: 12.5 fL (ref 7.5–12.5)
NEUTROS ABS: 1992 {cells}/uL (ref 1500–7800)
Neutrophils Relative %: 58.6 %
PLATELETS: 97 10*3/uL — AB (ref 140–400)
RBC: 3.81 10*6/uL (ref 3.80–5.10)
RDW: 15.9 % — ABNORMAL HIGH (ref 11.0–15.0)
TOTAL LYMPHOCYTE: 15 %
WBC mixed population: 748 cells/uL (ref 200–950)
WBC: 3.4 10*3/uL — AB (ref 3.8–10.8)

## 2017-08-25 LAB — BASIC METABOLIC PANEL WITH GFR
BUN: 18 mg/dL (ref 7–25)
CHLORIDE: 94 mmol/L — AB (ref 98–110)
CO2: 27 mmol/L (ref 20–32)
Calcium: 10 mg/dL (ref 8.6–10.4)
Creat: 0.71 mg/dL (ref 0.60–0.88)
GFR, Est African American: 90 mL/min/{1.73_m2} (ref >=60)
GFR, Est Non African American: 78 mL/min/{1.73_m2} (ref >=60)
GLUCOSE: 98 mg/dL (ref 65–99)
Potassium: 4.6 mmol/L (ref 3.5–5.3)
Sodium: 130 mmol/L — ABNORMAL LOW (ref 135–146)

## 2017-08-26 LAB — CULTURE INDICATED

## 2017-08-26 LAB — URINALYSIS W MICROSCOPIC + REFLEX CULTURE
BILIRUBIN URINE: NEGATIVE
Bacteria, UA: NONE SEEN /HPF
GLUCOSE, UA: NEGATIVE
HGB URINE DIPSTICK: NEGATIVE
Ketones, ur: NEGATIVE
NITRITES URINE, INITIAL: NEGATIVE
PROTEIN: NEGATIVE
RBC / HPF: NONE SEEN /HPF (ref 0–2)
Specific Gravity, Urine: 1.014 (ref 1.001–1.03)
pH: 8 (ref 5.0–8.0)

## 2017-08-26 LAB — URINE CULTURE
MICRO NUMBER: 90127442
SPECIMEN QUALITY: ADEQUATE

## 2017-08-26 LAB — EXTRA URINE SPECIMEN

## 2017-09-08 ENCOUNTER — Telehealth: Payer: Self-pay | Admitting: *Deleted

## 2017-09-08 NOTE — Telephone Encounter (Signed)
Message left on clinical intake voicemail:   Patient's son called very upset that he has called x 2 days in a row with no return call. Mr.[name] [name] expressed his dissatisfaction with this process of requesting to speak with Oneal GroutPandey, Mah[place], MD, would like a return call today.

## 2017-09-08 NOTE — Telephone Encounter (Signed)
Spoke with GrenadaBrittany (Dr.Pandey's assistant), Dr.Pandey plans to call patient's son tomorrow.  I called Mr.Bonneau to express my apologies that he has called for 2 days and left over 5 messages requesting to speak with Dr.Pandey. Mr.Remillard said he was told by Cala BradfordKimberly that he could expect a return call within 2 hours. Mr.Dowding states he needs to have a 10 min conversation to discuss medications and possible cause of current issues. Mr.Haffey did not wish to disclose any information to me.   I advised that Dr.Pandey will call him tomorrow and his response was the earlier the better.

## 2017-09-08 NOTE — Telephone Encounter (Signed)
Patient's son called and asks that you call him TODAY in reference to some concerns that he has about his mom's medications. He said she is very emotional and he thinks it may be coming from the meds. He said he has left at least 3 messages (I've not gotten any) and asks that you call him ASAP at [phone]. DPR on file for him.

## 2017-09-09 NOTE — Telephone Encounter (Signed)
Spoke with patient's son Haroldine LawsGreg Clawson. Concerns for increased crying episode and sadness, pt feels tired and has cloudy thinking. She has been taking newly increased wellbutrin 150 mg twice a day for now. Per family, she has been on cymbalta before with some help but this was discontinued with side effect of poor appetite. Family would like this medication discontinued and something else be tried. Decreased wellbutrin to 150 mg daily for now with follow up appointment on Monday 09/12/17. Family voices understanding this and will make medication changes and keep the appointment.   Of note, clarified on multiple phone calls and voice mails, per family they were calling [place] co-ordinator's number and leaving messages until they received [facility] number and left message yesterday that was answered/ returned in a timely manner by CMA. I have advised them to call [phone] from next time for clinical concerns.

## 2017-09-14 ENCOUNTER — Encounter: Payer: Self-pay | Admitting: Internal Medicine

## 2017-09-14 ENCOUNTER — Non-Acute Institutional Stay: Payer: Medicare Other | Admitting: Internal Medicine

## 2017-09-14 VITALS — BP 124/64 | HR 71 | Temp 97.8°F | Resp 16 | Ht 64.0 in | Wt 156.0 lb

## 2017-09-14 DIAGNOSIS — F321 Major depressive disorder, single episode, moderate: Secondary | ICD-10-CM | POA: Diagnosis not present

## 2017-09-14 MED ORDER — BUPROPION HCL ER (SR) 150 MG PO TB12: 150.0000 mg | ORAL_TABLET | ORAL | 0 refills | Status: DC

## 2017-09-14 MED ORDER — PAROXETINE HCL 10 MG PO TABS: ORAL_TABLET | ORAL | 0 refills | Status: DC

## 2017-09-14 NOTE — Progress Notes (Signed)
[facility]  Provider: Blanchie Serve MD   Location:  [place] of Service:  Clinic (12)  PCP: Blanchie Serve, MD Patient Care Team: Blanchie Serve, MD as PCP - General (Internal Medicine)  Extended Emergency Contact Information Primary Emergency Contact: Mander,[name] Address: [place], [place] [address] Johnnette Litter of [place] Phone: [phone] Mobile Phone: [phone] Relation: Son Secondary Emergency Contact: Deboard,[name]  [place] Phone: [phone] Relation: None   Goals of Care: Advanced Directive information Advanced Directives 12/22/2016  Does Patient Have a Medical Advance Directive? Yes  Type of Advance Directive Living will  Does patient want to make changes to medical advance directive? No - Patient declined  Copy of [place] in Chart? -      Chief Complaint  Patient presents with  . Acute Visit    follow up on depression  . Medication Refill    No refills needed at this time  . MMSE    30/30. Passed clock drawing  . PHQ9    14    HPI: Patient is a 84 y.o. female seen today for acute visit. She is here for follow up on her mood. Per last conversation over the phone with her son and daughter in law, wellbutrin does not seem to have helped her. She is more tearful and sad and has lost interest in activities and surroundings per family. Last visit, we had increased the dose of her wellbutrin. On 09/09/17, advised family to decrease wellbutrin from 150 mg bid to once a day. Patient mentions having down time. She feels she is having a meltdown at times and feels her back pain could be contributing some. She feels she has been looking back in time and feeling bad a bit too much and she is trying to live at present and not think about her past. She says "past is over and we dont know about tomorrow, we just have today". She denies any thoughts about hurting herself. She  feels she is sleeping on for most part. She sleeps on her chair due to her back pain. Appetite is good. She has trouble concentrating. She feels to have been bad to people and feels like a failure in front of God. Her energy is poor.   Depression screen PHQ 2/9 09/14/2017  Decreased Interest 1  Down, Depressed, Hopeless 3  PHQ - 2 Score 4  Altered sleeping 0  Tired, decreased energy 3  Change in appetite 0  Feeling bad or failure about yourself  3  Trouble concentrating 3  Moving slowly or fidgety/restless 1  Suicidal thoughts 0  PHQ-9 Score 14  Difficult doing work/chores Somewhat difficult    Past Medical History:  Diagnosis Date  . Anemia   . Anxiety   . Chronotropic incompetence 12/2012    potentially medication related; noted on CPET   . DDD (degenerative disc disease)   . Eczema   . GERD (gastroesophageal reflux disease)   . H/O hiatal hernia   . Hx: UTI (urinary tract infection)   . Hyperlipidemia   . Hypertension   . Hypothyroidism   . Osteopenia   . Septicemia ([place]) 2002   following UTI  . Vertigo, benign positional    Past Surgical History:  Procedure Laterality Date  . BREAST SURGERY     left biopsy  . CATARACT EXTRACTION Right    2  weeks ago  . CPET / MET - PFTS     Consistent with chronotropic incompetence; See attached report in the results section  . DILATION AND CURETTAGE OF UTERUS     x 2  . DOPPLER ECHOCARDIOGRAPHY  01/10/2013   Normal LV size and function. Normal EF. Air sclerosis but no stenosis  . ESOPHAGOGASTRODUODENOSCOPY  05/04/2012   Procedure: ESOPHAGOGASTRODUODENOSCOPY (EGD);  Surgeon: Inda Castle, MD;  Location: [facility];  Service: Endoscopy;  Laterality: N/A;  . EYE SURGERY     cataract extraction with ILO  ? eye  . IR KYPHO EA ADDL LEVEL THORACIC OR LUMBAR  10/28/2016  . IR KYPHO LUMBAR INC FX REDUCE BONE BX UNI/BIL CANNULATION INC/IMAGING  10/28/2016  . IR KYPHO THORACIC WITH BONE BIOPSY  12/24/2016  . IR RADIOLOGIST EVAL & MGMT   11/11/2016  . KNEE ARTHROSCOPY  2011   right  . TONSILLECTOMY    . TOTAL KNEE ARTHROPLASTY  04/24/2012   Procedure: TOTAL KNEE ARTHROPLASTY;  Surgeon: Gearlean Alf, MD;  Location: [facility];  Service: Orthopedics;  Laterality: Right;    reports that she quit smoking about 52 years ago. Her smoking use included cigarettes. She quit after 0.00 years of use. she has never used smokeless tobacco. She reports that she does not drink alcohol or use drugs. Social History   Socioeconomic History  . Marital status: Widowed    Spouse name: [name]  . Number of children: 3  . Years of education: Not on file  . Highest education level: Not on file  Social Needs  . Financial resource strain: Not on file  . Food insecurity - worry: Not on file  . Food insecurity - inability: Not on file  . Transportation needs - medical: Not on file  . Transportation needs - non-medical: Not on file  Occupational History  . Occupation: homemaker  Tobacco Use  . Smoking status: Former Smoker    Years: 0.00    Types: Cigarettes    Last attempt to quit: 05/03/1965    Years since quitting: 52.4  . Smokeless tobacco: Never Used  Substance and Sexual Activity  . Alcohol use: No    Comment: occ glass wine  . Drug use: No  . Sexual activity: Not on file  Other Topics Concern  . Not on file  Social History Narrative   She is a widowed mother of 3.   She is a former smoker, with a distant history having quit in 1966.   She does drink an occasional glass of red wine.   She is just now started to try doing exercise with water aerobics.     Family History  Problem Relation Age of Onset  . Lung cancer Father 10       Died at age 70  . Stroke Mother 29       Died in her late 62s.  Marland Kitchen Heart attack Mother 46  . Diabetes Mother   . Arthritis Mother     Health Maintenance  Topic Date Due  . Samul Dada  07/31/1949  . DEXA SCAN  07/30/1997  . PNA vac Low Risk Adult (1 of 2 - PCV13) 07/30/1997  . INFLUENZA  VACCINE  Completed    No Known Allergies  Outpatient Encounter Medications as of 09/14/2017  Medication Sig  . acetaminophen (TYLENOL) 500 MG tablet Take 2 tablets (1,000 mg total) by mouth 4 (four) times daily.  Marland Kitchen ALPRAZolam (XANAX) 0.25 MG tablet TAKE 1 TABLET BY MOUTH  EVERY DAY AS NEEDED  . amLODipine (NORVASC) 10 MG tablet Take 1 tablet (10 mg total) by mouth daily.  Marland Kitchen aspirin EC 81 MG tablet Take 81 mg by mouth daily.  Marland Kitchen buPROPion (WELLBUTRIN) 100 MG tablet Take 150 mg by mouth daily.  . calcium carbonate (OS-CAL) 600 MG TABS Take 600 mg by mouth 2 (two) times daily with a meal.  . cholecalciferol (VITAMIN D) 1000 UNITS tablet Take 1,000 Units by mouth daily.  . Cranberry 12600 MG CAPS Take 12,600 mg by mouth as needed.  . diphenhydramine-acetaminophen (TYLENOL PM) 25-500 MG TABS tablet Take 1 tablet by mouth at bedtime as needed.   . fish oil-omega-3 fatty acids 1000 MG capsule Take 1 g by mouth daily.  . furosemide (LASIX) 20 MG tablet Take 20 mg by mouth as needed for fluid or edema.   Marland Kitchen levothyroxine (SYNTHROID, LEVOTHROID) 75 MCG tablet Take 1 tablet (75 mcg total) by mouth daily before breakfast.  . lidocaine (LIDODERM) 5 % Place 1 patch onto the skin daily. Remove & Discard patch within 12 hours or as directed by MD  . losartan (COZAAR) 100 MG tablet TAKE 1 TABLET BY MOUTH  DAILY  . Multiple Vitamin (MULTIVITAMIN WITH MINERALS) TABS tablet Take 1 tablet by mouth daily.  . nitrofurantoin (MACRODANTIN) 50 MG capsule Take 50 mg by mouth daily.  Marland Kitchen omeprazole (PRILOSEC) 40 MG capsule Take 40 mg by mouth daily.  . sennosides-docusate sodium (SENOKOT-S) 8.6-50 MG tablet Take 2 tablets by mouth daily as needed for constipation.  . TYMLOS 3120 MCG/1.56ML SOPN Inject 80 mcg as directed daily.   . [DISCONTINUED] buPROPion (WELLBUTRIN) 100 MG tablet Take 1.5 tablets (150 mg total) by mouth 2 (two) times daily. (Patient not taking: Reported on 09/14/2017)   No facility-administered encounter  medications on file as of 09/14/2017.     Review of Systems  Constitutional: Positive for fatigue. Negative for appetite change and fever.  HENT: Negative for congestion.   Respiratory: Negative for shortness of breath.   Cardiovascular: Negative for chest pain and palpitations.  Musculoskeletal: Positive for back pain and gait problem.  Psychiatric/Behavioral: Positive for decreased concentration. Negative for behavioral problems, confusion, self-injury, sleep disturbance and suicidal ideas. The patient is nervous/anxious.     Vitals:   09/14/17 0858  BP: 124/64  Pulse: 71  Resp: 16  Temp: 97.8 F (36.6 C)  TempSrc: Oral  SpO2: 90%  Weight: 156 lb (70.8 kg)  Height: _0  (1.626 m)   Body mass index is 26.78 kg/m. Physical Exam  Constitutional: She is oriented to person, place, and time. She appears well-developed and well-nourished. No distress.  HENT:  Head: Normocephalic and atraumatic.  Mouth/Throat: Oropharynx is clear and moist.  Eyes: Conjunctivae and EOM are normal.  Neck: Neck supple.  Cardiovascular: Normal rate and regular rhythm.  Pulmonary/Chest: Effort normal and breath sounds normal.  Abdominal: Bowel sounds are normal.  Lymphadenopathy:    She has no cervical adenopathy.  Neurological: She is alert and oriented to person, place, and time.  Skin: Skin is warm and dry. She is not diaphoretic.  Psychiatric:  Tearful, anxious, needs redirection    Labs reviewed: Basic Metabolic Panel: Recent Labs    12/23/16 0516  12/26/16 0513 06/23/17 0810 08/25/17 0845  NA 132*   < > 131* 132* 130*  K 4.5   < > 4.3 4.7 4.6  CL 97*   < > 95* 96* 94*  CO2 26   < > 28 28  27  GLUCOSE 106*   < > 100* 84 98  BUN 15   < > 18 29* 18  CREATININE 0.55   < > 0.51 0.66 0.71  CALCIUM 9.6   < > 9.3 10.1 10.0  MG 2.0  --  2.1  --   --    < > = values in this interval not displayed.   Liver Function Tests: Recent Labs    12/21/16 2022 06/23/17 0810  AST 37 38*  ALT  23 27  ALKPHOS 103  --   BILITOT 0.5 0.4  PROT 7.7 7.1  ALBUMIN 4.6  --    No results for input(s): LIPASE, AMYLASE in the last 8760 hours. No results for input(s): AMMONIA in the last 8760 hours. CBC: Recent Labs    12/26/16 0513 06/23/17 0810 08/25/17 0845  WBC 3.0* 2.9* 3.4*  NEUTROABS 1.4* 1,018* 1,992  HGB 10.1* 10.3* 10.4*  HCT 30.3* 30.6* 31.6*  MCV 84.9 85.5 82.9  PLT 77* 76* 97*   Cardiac Enzymes: No results for input(s): CKTOTAL, CKMB, CKMBINDEX, TROPONINI in the last 8760 hours. BNP: Invalid input(s): POCBNP No results found for: HGBA1C Lab Results  Component Value Date   TSH 4.11 06/23/2017   Lab Results  Component Value Date   VITAMINB12 >2,000 (H) 06/23/2017   Lab Results  Component Value Date   FOLATE 17.3 05/03/2012   Lab Results  Component Value Date   IRON 48 05/03/2012   TIBC 262 05/03/2012   FERRITIN 746 (H) 05/03/2012    Lipid Panel: Recent Labs    06/23/17 0810  CHOL 205*  HDL 95  TRIG 63  CHOLHDL 2.2   No results found for: HGBA1C  Procedures since last visit: No results found.  Assessment/Plan  1. Current moderate episode of major depressive disorder, unspecified whether recurrent ([place]) wellbutrin has not shown desired effect. Wean off wellbutrin, currently on 150 mg daily, change this to 150 mg every other day for 1 week and discontinue. Start paroxetine 10 mg daily for 1 week, then 2 tablet = 20 mg daily until next visit. Common side effects explained. MMSE today 30/30.     Labs/tests ordered:  none  Next appointment: 3 weeks for mood assessment  Communication: reviewed care plan with patient    Blanchie Serve, MD Internal Medicine [place], [place] [address] Cell Phone (Monday-Friday 8 am - 5 pm): [phone] On Call: [phone] and follow prompts after 5 pm and on weekends Office Phone: [phone] Office Fax: [phone]

## 2017-09-14 NOTE — Patient Instructions (Addendum)
  I am weaning you off wellbutrin. Take your wellbutrin current dose every other day for 1 week from today and stop it.  Start paxil when you receive it from your pharmacy. Start taking 1 tablet daily for 1 week and then 2 tablet daily = 20 mg tablet until our next visit.  I will see you back in 3 weeks.

## 2017-09-20 ENCOUNTER — Telehealth: Payer: Self-pay | Admitting: *Deleted

## 2017-09-20 ENCOUNTER — Telehealth: Payer: Self-pay | Admitting: Internal Medicine

## 2017-09-20 NOTE — Telephone Encounter (Signed)
Left message asking the patient to call me at [phone] to schedule AWV w/ Sara at [facility] on either 09/21/17 afternoon or 09/30/17 afternoon. VDM (DD) °

## 2017-09-20 NOTE — Telephone Encounter (Signed)
Son, Tammy SoursGreg Sienkiewicz called requesting to speak with Dr. Glade LloydPandey regarding some concerns he has regarding patient. He wants to have a conversation with you about her medications. Stated it is Positive.Stated patient is Doing really well on 1/2 dose of Wellbutrin.  Wants you to call him at #[phone]

## 2017-09-21 NOTE — Telephone Encounter (Signed)
Contacted Mr Haroldine LawsGreg Jalomo (patient's son) by phone today. He mentions that pt is doing great with wellbutrin 150 mg daily and there has been improvement in mood and appetite. Advised to hold off on starting paxil for now. Continue wellbutrin 150 mg daily until next OV. Son voices understanding this.

## 2017-09-23 ENCOUNTER — Other Ambulatory Visit: Payer: Self-pay | Admitting: Cardiology

## 2017-09-24 ENCOUNTER — Other Ambulatory Visit: Payer: Self-pay | Admitting: Internal Medicine

## 2017-09-26 NOTE — Telephone Encounter (Signed)
[place] Database Verified  

## 2017-10-01 ENCOUNTER — Encounter (HOSPITAL_BASED_OUTPATIENT_CLINIC_OR_DEPARTMENT_OTHER): Payer: Self-pay | Admitting: Emergency Medicine

## 2017-10-01 ENCOUNTER — Emergency Department (HOSPITAL_BASED_OUTPATIENT_CLINIC_OR_DEPARTMENT_OTHER): Payer: Medicare Other

## 2017-10-01 ENCOUNTER — Emergency Department (HOSPITAL_BASED_OUTPATIENT_CLINIC_OR_DEPARTMENT_OTHER)
Admission: EM | Admit: 2017-10-01 | Discharge: 2017-10-01 | Disposition: A | Discharge status: Home / Self Care | Payer: Medicare Other | Attending: Emergency Medicine | Admitting: Emergency Medicine

## 2017-10-01 ENCOUNTER — Other Ambulatory Visit: Payer: Self-pay

## 2017-10-01 DIAGNOSIS — R059 Cough, unspecified: Secondary | ICD-10-CM

## 2017-10-01 DIAGNOSIS — Z7982 Long term (current) use of aspirin: Secondary | ICD-10-CM | POA: Insufficient documentation

## 2017-10-01 DIAGNOSIS — Z87891 Personal history of nicotine dependence: Secondary | ICD-10-CM | POA: Diagnosis not present

## 2017-10-01 DIAGNOSIS — I10 Essential (primary) hypertension: Secondary | ICD-10-CM | POA: Diagnosis not present

## 2017-10-01 DIAGNOSIS — J111 Influenza due to unidentified influenza virus with other respiratory manifestations: Secondary | ICD-10-CM

## 2017-10-01 DIAGNOSIS — Z79899 Other long term (current) drug therapy: Secondary | ICD-10-CM | POA: Insufficient documentation

## 2017-10-01 DIAGNOSIS — E785 Hyperlipidemia, unspecified: Secondary | ICD-10-CM | POA: Diagnosis not present

## 2017-10-01 DIAGNOSIS — R05 Cough: Secondary | ICD-10-CM

## 2017-10-01 DIAGNOSIS — E039 Hypothyroidism, unspecified: Secondary | ICD-10-CM | POA: Diagnosis not present

## 2017-10-01 DIAGNOSIS — R69 Illness, unspecified: Secondary | ICD-10-CM

## 2017-10-01 MED ORDER — DM-GUAIFENESIN ER 30-600 MG PO TB12: 1.0000 | ORAL_TABLET | Freq: Two times a day (BID) | ORAL | 1 refills | Status: DC

## 2017-10-01 MED ORDER — OSELTAMIVIR PHOSPHATE 75 MG PO CAPS: 75.0000 mg | ORAL_CAPSULE | Freq: Two times a day (BID) | ORAL | 0 refills | Status: DC

## 2017-10-01 NOTE — ED Provider Notes (Signed)
[facility] EMERGENCY DEPARTMENT Provider Note   CSN: [id number] Arrival date & time: 10/01/17  0930     History   Chief Complaint Chief Complaint  Patient presents with  . Cough    HPI [name] [name] is a 84 y.o. female.  Patient referred in from [facility] [place] for cough that started yesterday.  Patient had a lot of coughing and congestion overnight.  No history of fever no history of nausea vomiting or diarrhea.  Sent in by nurse here for concern for pneumonia.  Patient states she did have the flu vaccine this year.  There has been influenza at the home.      Past Medical History:  Diagnosis Date  . Anemia   . Anxiety   . Chronotropic incompetence 12/2012    potentially medication related; noted on CPET   . DDD (degenerative disc disease)   . Eczema   . GERD (gastroesophageal reflux disease)   . H/O hiatal hernia   . Hx: UTI (urinary tract infection)   . Hyperlipidemia   . Hypertension   . Hypothyroidism   . Osteopenia   . Septicemia ([place]) 2002   following UTI  . Vertigo, benign positional     Patient Active Problem List   Diagnosis Date Noted  . Current mild episode of major depressive disorder ([place]) 08/01/2017  . Pancytopenia ([place]) 06/29/2017  . Depression, major, single episode, moderate ([place]) 06/29/2017  . Osteoporosis 05/25/2017  . Acquired hypothyroidism 05/25/2017  . B12 deficiency 05/25/2017  . Recurrent cystitis 05/25/2017  . Chronic back pain 05/25/2017  . GERD (gastroesophageal reflux disease) 12/22/2016  . Depression with anxiety 12/22/2016  . Acute pyelonephritis 12/21/2016  . Chronic constipation 10/26/2016  . Compression fracture of L2 lumbar vertebra, closed, initial encounter ([place]) 10/21/2016  . Hyponatremia 10/21/2016  . Thrombocytopenia ([place]) 10/21/2016  . Hypertensive heart disease 05/02/2016  . Bilateral leg edema 04/30/2016  . Exertional dyspnea 09/29/2013  . Essential hypertension 01/29/2013  . Fatigue with decreased  exercise tolerance 12/25/2012  . Chronotropic incompetence 12/24/2012  . Aortic ejection murmur 12/22/2012  . Esophagitis 05/04/2012  . GI bleed 05/03/2012  . Anemia 05/03/2012  . Postop Transfusion 04/27/2012  . OA (osteoarthritis) of knee 04/24/2012    Past Surgical History:  Procedure Laterality Date  . BREAST SURGERY     left biopsy  . CATARACT EXTRACTION Right    2 weeks ago  . CPET / MET - PFTS     Consistent with chronotropic incompetence; See attached report in the results section  . DILATION AND CURETTAGE OF UTERUS     x 2  . DOPPLER ECHOCARDIOGRAPHY  01/10/2013   Normal LV size and function. Normal EF. Air sclerosis but no stenosis  . ESOPHAGOGASTRODUODENOSCOPY  05/04/2012   Procedure: ESOPHAGOGASTRODUODENOSCOPY (EGD);  Surgeon: Inda Castle, MD;  Location: [facility];  Service: Endoscopy;  Laterality: N/A;  . EYE SURGERY     cataract extraction with ILO  ? eye  . IR KYPHO EA ADDL LEVEL THORACIC OR LUMBAR  10/28/2016  . IR KYPHO LUMBAR INC FX REDUCE BONE BX UNI/BIL CANNULATION INC/IMAGING  10/28/2016  . IR KYPHO THORACIC WITH BONE BIOPSY  12/24/2016  . IR RADIOLOGIST EVAL & MGMT  11/11/2016  . KNEE ARTHROSCOPY  2011   right  . TONSILLECTOMY    . TOTAL KNEE ARTHROPLASTY  04/24/2012   Procedure: TOTAL KNEE ARTHROPLASTY;  Surgeon: Gearlean Alf, MD;  Location: [facility];  Service: Orthopedics;  Laterality: Right;  OB History    No data available       Home Medications    Prior to Admission medications   Medication Sig Start Date End Date Taking? Authorizing Provider  ALPRAZolam (XANAX) 0.25 MG tablet TAKE 1 TABLET BY MOUTH EVERY DAY AS NEEDED 09/26/17   Blanchie Serve, MD  amLODipine (NORVASC) 10 MG tablet Take 1 tablet (10 mg total) by mouth daily. 05/08/12   Rai, Vernelle Emerald, MD  aspirin EC 81 MG tablet Take 81 mg by mouth daily.    [provider]  buPROPion (WELLBUTRIN SR) 150 MG 12 hr tablet Take 1 tablet (150 mg total) by mouth every other day. For 1  week and stop 09/14/17   Blanchie Serve, MD  calcium carbonate (OS-CAL) 600 MG TABS Take 600 mg by mouth 2 (two) times daily with a meal.    [provider]  cholecalciferol (VITAMIN D) 1000 UNITS tablet Take 1,000 Units by mouth daily.    [provider]  Cranberry 12600 MG CAPS Take 12,600 mg by mouth as needed.    [provider]  dextromethorphan-guaiFENesin (MUCINEX DM) 30-600 MG 12hr tablet Take 1 tablet by mouth 2 (two) times daily. 10/01/17   Fredia Sorrow, MD  diphenhydramine-acetaminophen (TYLENOL PM) 25-500 MG TABS tablet Take 1 tablet by mouth at bedtime as needed.     [provider]  fish oil-omega-3 fatty acids 1000 MG capsule Take 1 g by mouth daily.    [provider]  furosemide (LASIX) 20 MG tablet Take 20 mg by mouth as needed for fluid or edema.     [provider]  levothyroxine (SYNTHROID, LEVOTHROID) 75 MCG tablet Take 1 tablet (75 mcg total) by mouth daily before breakfast. 12/27/16   Florencia Reasons, MD  lidocaine (LIDODERM) 5 % Place 1 patch onto the skin daily. Remove & Discard patch within 12 hours or as directed by MD 08/22/17   Blanchie Serve, MD  losartan (COZAAR) 100 MG tablet TAKE 1 TABLET BY MOUTH  DAILY 09/26/17   Leonie Man, MD  Multiple Vitamin (MULTIVITAMIN WITH MINERALS) TABS tablet Take 1 tablet by mouth daily.    [provider]  nitrofurantoin (MACRODANTIN) 50 MG capsule Take 50 mg by mouth daily.    [provider]  omeprazole (PRILOSEC) 40 MG capsule Take 40 mg by mouth daily.    [provider]  oseltamivir (TAMIFLU) 75 MG capsule Take 1 capsule (75 mg total) by mouth every 12 (twelve) hours. 10/01/17   Fredia Sorrow, MD  PARoxetine (PAXIL) 10 MG tablet Take 1 tablet daily for 1 week and then 2 tablet daily until next visit. 09/14/17   Blanchie Serve, MD  sennosides-docusate sodium (SENOKOT-S) 8.6-50 MG tablet Take 2 tablets by mouth daily as needed for constipation.    [provider]  TYMLOS 3120 MCG/1.56ML SOPN Inject 80 mcg as directed daily.  12/04/16   [provider]    Family History Family History  Problem Relation Age of Onset  . Lung cancer Father 41       Died at age 40  . Stroke Mother 2       Died in her late 76s.  Marland Kitchen Heart attack Mother 41  . Diabetes Mother   . Arthritis Mother     Social History Social History   Tobacco Use  . Smoking status: Former Smoker    Years: 0.00    Types: Cigarettes    Last attempt to quit: 05/03/1965  Years since quitting: 52.4  . Smokeless tobacco: Never Used  Substance Use Topics  . Alcohol use: No    Comment: occ glass wine  . Drug use: No     Allergies   Patient has no known allergies.   Review of Systems Review of Systems  Constitutional: Negative for fever.  HENT: Positive for congestion.   Eyes: Negative for redness.  Respiratory: Positive for cough.   Cardiovascular: Negative for chest pain.  Gastrointestinal: Negative for diarrhea, nausea and vomiting.  Genitourinary: Negative for dysuria.  Musculoskeletal: Negative for myalgias.  Skin: Negative for rash.  Neurological: Negative for headaches.  Hematological: Does not bruise/bleed easily.  Psychiatric/Behavioral: Negative for confusion.     Physical Exam Updated Vital Signs BP 133/66 (BP Location: Right Arm)   Pulse 80   Temp 98.1 F (36.7 C) (Oral)   Resp 16   Ht 1.626 m ('5\' 4"' )   Wt 70.8 kg (156 lb)   SpO2 100%   BMI 26.78 kg/m   Physical Exam  Constitutional: She is oriented to person, place, and time. She appears well-developed and well-nourished. No distress.  HENT:  Mouth/Throat: Oropharynx is clear and moist. No oropharyngeal exudate.  Eyes: Conjunctivae and EOM are normal. Pupils are equal, round, and reactive to light.  Neck: Neck supple.  Cardiovascular: Normal rate.  Pulmonary/Chest: Effort normal and breath sounds normal. No respiratory distress. She has no wheezes. She has no rales.    Abdominal: Soft. Bowel sounds are normal. There is no tenderness.  Musculoskeletal: Normal range of motion.  Neurological: She is alert and oriented to person, place, and time. No cranial nerve deficit or sensory deficit. She exhibits normal muscle tone. Coordination normal.  Skin: Skin is warm.  Nursing note and vitals reviewed.    ED Treatments / Results  Labs (all labs ordered are listed, but only abnormal results are displayed) Labs Reviewed - No data to display  EKG  EKG Interpretation None       Radiology Dg Chest 2 View  Result Date: 10/01/2017 CLINICAL DATA:  Cough EXAM: CHEST - 2 VIEW COMPARISON:  03/24/2015 FINDINGS: Heart and mediastinal contours are within normal limits. No focal opacities or effusions. No acute bony abnormality. Multiple compression fractures and changes of vertebroplasty in the mid and lower thoracic spine and upper lumbar spine. Old right rib fracture. IMPRESSION: No active cardiopulmonary disease. Electronically Signed   By: Rolm Baptise M.D.   On: 10/01/2017 10:20    Procedures Procedures (including critical care time)  Medications Ordered in ED Medications - No data to display   Initial Impression / Assessment and Plan / ED Course  I have reviewed the triage vital signs and the nursing notes.  Pertinent labs & imaging results that were available during my care of the patient were reviewed by me and considered in my medical decision making (see chart for details).    Symptoms could be consistent with early influenza.  However patient nontoxic no acute distress chest x-ray negative for pneumonia.  Have offered Tamiflu and prescription provided.  Also will treat with Mucinex DM for the cough and congestion.  Patient did have the influenza vaccine this year but based on her elderly age still feel Tamiflu may be appropriate.  Symptoms just started yesterday.   Final Clinical Impressions(s) / ED Diagnoses   Final diagnoses:  Influenza-like  illness  Cough    ED Discharge Orders        Ordered    dextromethorphan-guaiFENesin [facility] DM)  30-600 MG 12hr tablet  2 times daily     10/01/17 1048    oseltamivir (TAMIFLU) 75 MG capsule  Every 12 hours     10/01/17 1048       Fredia Sorrow, MD 10/01/17 1058

## 2017-10-01 NOTE — ED Triage Notes (Signed)
[facility] Reports coughing since yesterday. Nurse told her that her lung sounds were diminished.

## 2017-10-01 NOTE — ED Notes (Signed)
ED Provider at bedside. 

## 2017-10-01 NOTE — ED Notes (Signed)
Pt ambulatory with walked to XR at this time.

## 2017-10-01 NOTE — Discharge Instructions (Signed)
Chest x-ray negative for pneumonia.  Take the Mucinex DM to help suppress the cough and clear the congestion.  Also recommend since symptoms just started within the last 48 hours starting Tamiflu in case this is influenza.  Prescription provided for that as well.  Return for any new or worse symptoms.

## 2017-10-12 ENCOUNTER — Non-Acute Institutional Stay: Payer: Medicare Other | Admitting: Internal Medicine

## 2017-10-12 ENCOUNTER — Encounter: Payer: Self-pay | Admitting: Internal Medicine

## 2017-10-12 VITALS — BP 124/60 | HR 80 | Temp 98.1°F | Resp 16 | Ht 64.0 in | Wt 158.2 lb

## 2017-10-12 DIAGNOSIS — D61818 Other pancytopenia: Secondary | ICD-10-CM

## 2017-10-12 DIAGNOSIS — I5032 Chronic diastolic (congestive) heart failure: Secondary | ICD-10-CM

## 2017-10-12 DIAGNOSIS — E039 Hypothyroidism, unspecified: Secondary | ICD-10-CM | POA: Diagnosis not present

## 2017-10-12 DIAGNOSIS — F320 Major depressive disorder, single episode, mild: Secondary | ICD-10-CM

## 2017-10-12 DIAGNOSIS — I110 Hypertensive heart disease with heart failure: Secondary | ICD-10-CM

## 2017-10-12 MED ORDER — LORATADINE 10 MG PO TABS: 10.0000 mg | ORAL_TABLET | Freq: Every day | ORAL | 0 refills | Status: DC

## 2017-10-12 NOTE — Progress Notes (Signed)
[facility]  Provider: Blanchie Serve MD   Location:  [place] of Service:  Clinic (12)  PCP: Blanchie Serve, MD Patient Care Team: Blanchie Serve, MD as PCP - General (Internal Medicine)  Extended Emergency Contact Information Primary Emergency Contact: Tarnowski,[name] Address: [place], [place] [address] Johnnette Litter of [place] Phone: [phone] Mobile Phone: [phone] Relation: Son Secondary Emergency Contact: Baruch,[name]  [place] Phone: [phone] Relation: None   Goals of Care: Advanced Directive information Advanced Directives 10/01/2017  Does Patient Have a Medical Advance Directive? Yes  Type of Paramedic of [place];Living will  Does patient want to make changes to medical advance directive? -  Copy of [place] in Chart? No - copy requested      Chief Complaint  Patient presents with  . Acute Visit    3 week follow up for depression. Patient stated that she feels fine.   . Medication Refill    No refills needed at this time    HPI: Patient is a 84 y.o. female seen today for follow up visit for her depression. After conversation with family, paxil has not been started. Patient is currently on welbutrin 150 mg daily and tolerating it well. Family have noticed improvement in her mood and behavior. She was in the ED on 10/01/17 with flu like symptom and was placed on tamiflu. Has completed her treatment with tamiflu. She has some runny nose and dry cough at present. Her mood is better. She tells me today that she has decided not to dwell on her past and think positive. She is resting better at night. She denies any thoughts about hurting herself. She sleeps on her chair due to her back pain. Appetite is good.    Depression screen PHQ 2/9 09/14/2017  Decreased Interest 1  Down, Depressed, Hopeless 3  PHQ - 2 Score 4  Altered sleeping 0  Tired,  decreased energy 3  Change in appetite 0  Feeling bad or failure about yourself  3  Trouble concentrating 3  Moving slowly or fidgety/restless 1  Suicidal thoughts 0  PHQ-9 Score 14  Difficult doing work/chores Somewhat difficult    Past Medical History:  Diagnosis Date  . Anemia   . Anxiety   . Chronotropic incompetence 12/2012    potentially medication related; noted on CPET   . DDD (degenerative disc disease)   . Eczema   . GERD (gastroesophageal reflux disease)   . H/O hiatal hernia   . Hx: UTI (urinary tract infection)   . Hyperlipidemia   . Hypertension   . Hypothyroidism   . Osteopenia   . Septicemia ([place]) 2002   following UTI  . Vertigo, benign positional    Past Surgical History:  Procedure Laterality Date  . BREAST SURGERY     left biopsy  . CATARACT EXTRACTION Right    2 weeks ago  . CPET / MET - PFTS     Consistent with chronotropic incompetence; See attached report in the results section  . DILATION AND CURETTAGE OF UTERUS     x 2  . DOPPLER ECHOCARDIOGRAPHY  01/10/2013   Normal LV size and function. Normal EF. Air sclerosis but no stenosis  . ESOPHAGOGASTRODUODENOSCOPY  05/04/2012   Procedure: ESOPHAGOGASTRODUODENOSCOPY (EGD);  Surgeon: Inda Castle, MD;  Location: [facility];  Service: Endoscopy;  Laterality: N/A;  .  EYE SURGERY     cataract extraction with ILO  ? eye  . IR KYPHO EA ADDL LEVEL THORACIC OR LUMBAR  10/28/2016  . IR KYPHO LUMBAR INC FX REDUCE BONE BX UNI/BIL CANNULATION INC/IMAGING  10/28/2016  . IR KYPHO THORACIC WITH BONE BIOPSY  12/24/2016  . IR RADIOLOGIST EVAL & MGMT  11/11/2016  . KNEE ARTHROSCOPY  2011   right  . TONSILLECTOMY    . TOTAL KNEE ARTHROPLASTY  04/24/2012   Procedure: TOTAL KNEE ARTHROPLASTY;  Surgeon: Gearlean Alf, MD;  Location: [facility];  Service: Orthopedics;  Laterality: Right;    reports that she quit smoking about 52 years ago. Her smoking use included cigarettes. She quit after 0.00 years of use. she has never  used smokeless tobacco. She reports that she does not drink alcohol or use drugs. Social History   Socioeconomic History  . Marital status: Widowed    Spouse name: [name]  . Number of children: 3  . Years of education: Not on file  . Highest education level: Not on file  Social Needs  . Financial resource strain: Not on file  . Food insecurity - worry: Not on file  . Food insecurity - inability: Not on file  . Transportation needs - medical: Not on file  . Transportation needs - non-medical: Not on file  Occupational History  . Occupation: homemaker  Tobacco Use  . Smoking status: Former Smoker    Years: 0.00    Types: Cigarettes    Last attempt to quit: 05/03/1965    Years since quitting: 52.4  . Smokeless tobacco: Never Used  Substance and Sexual Activity  . Alcohol use: No    Comment: occ glass wine  . Drug use: No  . Sexual activity: Not on file  Other Topics Concern  . Not on file  Social History Narrative   She is a widowed mother of 3.   She is a former smoker, with a distant history having quit in 1966.   She does drink an occasional glass of red wine.   She is just now started to try doing exercise with water aerobics.     Family History  Problem Relation Age of Onset  . Lung cancer Father 50       Died at age 107  . Stroke Mother 22       Died in her late 73s.  Marland Kitchen Heart attack Mother 38  . Diabetes Mother   . Arthritis Mother     Health Maintenance  Topic Date Due  . Samul Dada  07/31/1949  . DEXA SCAN  07/30/1997  . PNA vac Low Risk Adult (1 of 2 - PCV13) 07/30/1997  . INFLUENZA VACCINE  Completed    No Known Allergies  Outpatient Encounter Medications as of 10/12/2017  Medication Sig  . ALPRAZolam (XANAX) 0.25 MG tablet TAKE 1 TABLET BY MOUTH EVERY DAY AS NEEDED  . amLODipine (NORVASC) 10 MG tablet Take 1 tablet (10 mg total) by mouth daily.  Marland Kitchen aspirin EC 81 MG tablet Take 81 mg by mouth daily.  Marland Kitchen buPROPion (WELLBUTRIN SR) 150 MG 12 hr tablet  Take 150 mg by mouth daily.  . calcium carbonate (OS-CAL) 600 MG TABS Take 600 mg by mouth 2 (two) times daily with a meal.  . cholecalciferol (VITAMIN D) 1000 UNITS tablet Take 1,000 Units by mouth daily.  . Cranberry 12600 MG CAPS Take 12,600 mg by mouth as needed.  Marland Kitchen dextromethorphan-guaiFENesin (MUCINEX DM) 30-600 MG 12hr  tablet Take 1 tablet by mouth 2 (two) times daily.  . diphenhydramine-acetaminophen (TYLENOL PM) 25-500 MG TABS tablet Take 1 tablet by mouth at bedtime as needed.   . fish oil-omega-3 fatty acids 1000 MG capsule Take 1 g by mouth daily.  . furosemide (LASIX) 20 MG tablet Take 20 mg by mouth as needed for fluid or edema.   Marland Kitchen levothyroxine (SYNTHROID, LEVOTHROID) 75 MCG tablet Take 1 tablet (75 mcg total) by mouth daily before breakfast.  . lidocaine (LIDODERM) 5 % Place 1 patch onto the skin daily. Remove & Discard patch within 12 hours or as directed by MD  . losartan (COZAAR) 100 MG tablet TAKE 1 TABLET BY MOUTH  DAILY  . Multiple Vitamin (MULTIVITAMIN WITH MINERALS) TABS tablet Take 1 tablet by mouth daily.  . nitrofurantoin (MACRODANTIN) 50 MG capsule Take 50 mg by mouth daily.  Marland Kitchen omeprazole (PRILOSEC) 40 MG capsule Take 40 mg by mouth daily.  . sennosides-docusate sodium (SENOKOT-S) 8.6-50 MG tablet Take 2 tablets by mouth daily as needed for constipation.  . TYMLOS 3120 MCG/1.56ML SOPN Inject 80 mcg as directed daily.   . [DISCONTINUED] buPROPion (WELLBUTRIN SR) 150 MG 12 hr tablet Take 1 tablet (150 mg total) by mouth every other day. For 1 week and stop  . [DISCONTINUED] oseltamivir (TAMIFLU) 75 MG capsule Take 1 capsule (75 mg total) by mouth every 12 (twelve) hours.  . [DISCONTINUED] PARoxetine (PAXIL) 10 MG tablet Take 1 tablet daily for 1 week and then 2 tablet daily until next visit. (Patient not taking: Reported on 10/12/2017)   No facility-administered encounter medications on file as of 10/12/2017.     Review of Systems  Constitutional: Positive for  fatigue. Negative for appetite change and fever.  HENT: Positive for rhinorrhea. Negative for congestion, ear pain, sore throat and trouble swallowing.   Respiratory: Positive for cough. Negative for shortness of breath.   Cardiovascular: Negative for chest pain and palpitations.  Musculoskeletal: Positive for back pain and gait problem.  Neurological: Negative for dizziness and headaches.  Psychiatric/Behavioral: Positive for decreased concentration. Negative for behavioral problems, confusion, dysphoric mood, self-injury, sleep disturbance and suicidal ideas. The patient is not nervous/anxious.     Vitals:   10/12/17 1102  BP: 124/60  Pulse: 80  Resp: 16  Temp: 98.1 F (36.7 C)  TempSrc: Oral  SpO2: 98%  Weight: 158 lb 3.2 oz (71.8 kg)  Height: _0  (1.626 m)   Body mass index is 27.15 kg/m. Physical Exam  Constitutional: She is oriented to person, place, and time. She appears well-developed and well-nourished. No distress.  HENT:  Head: Normocephalic and atraumatic.  Right Ear: External ear normal.  Left Ear: External ear normal.  Nose: Nose normal.  Mouth/Throat: Oropharynx is clear and moist. No oropharyngeal exudate.  Eyes: Conjunctivae and EOM are normal. Pupils are equal, round, and reactive to light. Right eye exhibits no discharge. Left eye exhibits no discharge.  Neck: Neck supple.  Cardiovascular: Normal rate and regular rhythm.  Pulmonary/Chest: Effort normal and breath sounds normal.  Abdominal: Bowel sounds are normal.  Lymphadenopathy:    She has no cervical adenopathy.  Neurological: She is alert and oriented to person, place, and time.  Skin: She is not diaphoretic.  Psychiatric: She has a normal mood and affect.    Labs reviewed: Basic Metabolic Panel: Recent Labs    12/23/16 0516  12/26/16 0513 06/23/17 0810 08/25/17 0845  NA 132*   < > 131* 132* 130*  K 4.5   < >  4.3 4.7 4.6  CL 97*   < > 95* 96* 94*  CO2 26   < > _0 GLUCOSE 106*   <  > 100* 84 98  BUN 15   < > 18 29* 18  CREATININE 0.55   < > 0.51 0.66 0.71  CALCIUM 9.6   < > 9.3 10.1 10.0  MG 2.0  --  2.1  --   --    < > = values in this interval not displayed.   Liver Function Tests: Recent Labs    12/21/16 2022 06/23/17 0810  AST 37 38*  ALT 23 27  ALKPHOS 103  --   BILITOT 0.5 0.4  PROT 7.7 7.1  ALBUMIN 4.6  --    No results for input(s): LIPASE, AMYLASE in the last 8760 hours. No results for input(s): AMMONIA in the last 8760 hours. CBC: Recent Labs    12/26/16 0513 06/23/17 0810 08/25/17 0845  WBC 3.0* 2.9* 3.4*  NEUTROABS 1.4* 1,018* 1,992  HGB 10.1* 10.3* 10.4*  HCT 30.3* 30.6* 31.6*  MCV 84.9 85.5 82.9  PLT 77* 76* 97*   Cardiac Enzymes: No results for input(s): CKTOTAL, CKMB, CKMBINDEX, TROPONINI in the last 8760 hours. BNP: Invalid input(s): POCBNP No results found for: HGBA1C Lab Results  Component Value Date   TSH 4.11 06/23/2017   Lab Results  Component Value Date   VITAMINB12 >2,000 (H) 06/23/2017   Lab Results  Component Value Date   FOLATE 17.3 05/03/2012   Lab Results  Component Value Date   IRON 48 05/03/2012   TIBC 262 05/03/2012   FERRITIN 746 (H) 05/03/2012    Lipid Panel: Recent Labs    06/23/17 0810  CHOL 205*  HDL 95  LDLCALC 95  TRIG 63  CHOLHDL 2.2   No results found for: HGBA1C  Procedures since last visit: Dg Chest 2 View  Result Date: 10/01/2017 CLINICAL DATA:  Cough EXAM: CHEST - 2 VIEW COMPARISON:  03/24/2015 FINDINGS: Heart and mediastinal contours are within normal limits. No focal opacities or effusions. No acute bony abnormality. Multiple compression fractures and changes of vertebroplasty in the mid and lower thoracic spine and upper lumbar spine. Old right rib fracture. IMPRESSION: No active cardiopulmonary disease. Electronically Signed   By: Rolm Baptise M.D.   On: 10/01/2017 10:20    Assessment/Plan  1. Pancytopenia ([place]) Lab prior to next visit - CBC (no diff); Future  2.  Current mild episode of major depressive disorder, unspecified whether recurrent ([place]) Continue wellbutrin 150 mg daily for now, monitor mood  3. Hypertensive heart disease with chronic diastolic congestive heart failure (HCC) - CMP with eGFR([facility]); Future - Lipid Panel; Future - TSH; Future - CBC (no diff); Future  4. Acquired hypothyroidism - TSH; Future  5. Allergic rhinitis Start claritin 10 mg daily and monitor  Labs/tests ordered:   Lab Orders     CMP with eGFR([facility])     Lipid Panel     TSH     CBC (no diff)  Next appointment: 2-3 months for Routine visit.   Communication: reviewed care plan with patient    Blanchie Serve, MD Internal Medicine [place], [place] [address] Cell Phone (Monday-Friday 8 am - 5 pm): [phone] On Call: [phone] and follow prompts after 5 pm and on weekends Office Phone: [phone] Office Fax: [phone]

## 2017-10-12 NOTE — Patient Instructions (Signed)
  Take claritin once a day to help with your runny nose for now.  Continue wellbutrin 150 mg daily for your mood.   I will see you back  2 months for routine follow up.

## 2017-10-17 ENCOUNTER — Other Ambulatory Visit: Payer: Self-pay

## 2017-10-17 ENCOUNTER — Telehealth: Payer: Self-pay | Admitting: *Deleted

## 2017-10-17 DIAGNOSIS — I10 Essential (primary) hypertension: Secondary | ICD-10-CM

## 2017-10-17 MED ORDER — CANDESARTAN CILEXETIL 16 MG PO TABS: 16.0000 mg | ORAL_TABLET | Freq: Every day | ORAL | 3 refills | Status: DC

## 2017-10-17 NOTE — Telephone Encounter (Signed)
Left a message for the patient with instructions for the new medication. I also left the number for the clinic for the patient or her caregiver to call if they have any questions or concerns.

## 2017-10-17 NOTE — Telephone Encounter (Signed)
  Sent script for candesartan 16 mg daily in place of losartan 100 mg daily due to recall.   Stop taking losartan once your new medication candesartan arrives. To take your new medication once a day.   Please call patient's caregiver/ CNA and notify.

## 2017-10-17 NOTE — Telephone Encounter (Signed)
[name]), patient is concerned about losartan being recalled recently. She asks to change meds even thought she doesn't have any current problems. Please send to [facility] [address]

## 2017-10-25 ENCOUNTER — Inpatient Hospital Stay: Payer: Medicare Other | Attending: Oncology | Admitting: Oncology

## 2017-10-25 ENCOUNTER — Inpatient Hospital Stay: Payer: Medicare Other

## 2017-10-25 ENCOUNTER — Telehealth: Payer: Self-pay

## 2017-10-25 VITALS — BP 153/57 | HR 79 | Temp 97.7°F | Resp 18 | Ht 64.0 in | Wt 162.1 lb

## 2017-10-25 DIAGNOSIS — D649 Anemia, unspecified: Secondary | ICD-10-CM

## 2017-10-25 DIAGNOSIS — Z7982 Long term (current) use of aspirin: Secondary | ICD-10-CM | POA: Diagnosis not present

## 2017-10-25 DIAGNOSIS — Z79899 Other long term (current) drug therapy: Secondary | ICD-10-CM

## 2017-10-25 DIAGNOSIS — D696 Thrombocytopenia, unspecified: Secondary | ICD-10-CM | POA: Insufficient documentation

## 2017-10-25 DIAGNOSIS — D508 Other iron deficiency anemias: Secondary | ICD-10-CM

## 2017-10-25 LAB — CBC WITH DIFFERENTIAL/PLATELET
BASOS PCT: 0 %
Basophils Absolute: 0 10*3/uL (ref 0.0–0.1)
EOS ABS: 0 10*3/uL (ref 0.0–0.5)
EOS PCT: 1 %
HEMATOCRIT: 32.8 % — AB (ref 34.8–46.6)
Hemoglobin: 10.7 g/dL — ABNORMAL LOW (ref 11.6–15.9)
Lymphocytes Relative: 24 %
Lymphs Abs: 1.2 10*3/uL (ref 0.9–3.3)
MCH: 28.7 pg (ref 25.1–34.0)
MCHC: 32.6 g/dL (ref 31.5–36.0)
MCV: 87.9 fL (ref 79.5–101.0)
MONO ABS: 0.8 10*3/uL (ref 0.1–0.9)
MONOS PCT: 16 %
Neutro Abs: 3.1 10*3/uL (ref 1.5–6.5)
Neutrophils Relative %: 59 %
PLATELETS: 84 10*3/uL — AB (ref 145–400)
RBC: 3.73 MIL/uL (ref 3.70–5.45)
RDW: 19.8 % — AB (ref 11.2–14.5)
WBC: 5.2 10*3/uL (ref 3.9–10.3)

## 2017-10-25 LAB — COMPREHENSIVE METABOLIC PANEL
ALBUMIN: 3.9 g/dL (ref 3.5–5.0)
ALK PHOS: 99 U/L (ref 40–150)
ALT: 22 U/L (ref 0–55)
AST: 30 U/L (ref 5–34)
Anion gap: 8 (ref 3–11)
BILIRUBIN TOTAL: 0.3 mg/dL (ref 0.2–1.2)
BUN: 28 mg/dL — AB (ref 7–26)
CALCIUM: 10.4 mg/dL (ref 8.4–10.4)
CO2: 26 mmol/L (ref 22–29)
CREATININE: 0.86 mg/dL (ref 0.60–1.10)
Chloride: 99 mmol/L (ref 98–109)
GFR calc Af Amer: >60 mL/min (ref >60)
GFR calc non Af Amer: 60 mL/min — ABNORMAL LOW (ref >60)
GLUCOSE: 107 mg/dL (ref 70–140)
Potassium: 4.2 mmol/L (ref 3.5–5.1)
Sodium: 133 mmol/L — ABNORMAL LOW (ref 136–145)
TOTAL PROTEIN: 7.4 g/dL (ref 6.4–8.3)

## 2017-10-25 LAB — IRON AND TIBC
IRON: 94 ug/dL (ref 41–142)
Saturation Ratios: 26 % (ref 21–57)
TIBC: 366 ug/dL (ref 236–444)
UIBC: 272 ug/dL

## 2017-10-25 LAB — FOLATE: Folate: 23 ng/mL (ref >5.9)

## 2017-10-25 LAB — VITAMIN B12: Vitamin B-12: 1333 pg/mL — ABNORMAL HIGH (ref 180–914)

## 2017-10-25 LAB — FERRITIN: FERRITIN: 102 ng/mL (ref 9–269)

## 2017-10-25 NOTE — Telephone Encounter (Signed)
Printed avs and calender of upcoming appointment. Per 4/2 los 

## 2017-10-25 NOTE — Progress Notes (Signed)
Hematology and Oncology Follow Up Visit  [name] [name] [id number] 09-Aug-1930 84 y.o. 10/25/2017 2:53 PM Blanchie Serve, MDPandey, Mahima, MD   Principle Diagnosis: 84 year old woman with thrombocytopenia diagnosed in January 2019.  She is found to have a platelet count close to 80,000.  Differential diagnosis includes reactive thrombocytopenia versus early myelodysplasia.  Current therapy: Active surveillance.  Interim History: [name] presents today for a follow-up.  She reports no major changes since her last visit.  Her mood has improved since she was started on Wellbutrin and last few months.  She does report chronic fatigue but has not dramatically changed since the last visit.  She denies any hematochezia, melena and epistaxis or petechiae.  She denies any ecchymosis.  She still ambulates with the help of a walker without any falls or syncope.  She does not report any headaches, blurry vision, syncope or seizures. Does not report any fevers, chills or sweats.  Does not report any cough, wheezing or hemoptysis.  Does not report any chest pain, palpitation, orthopnea or leg edema.  Does not report any nausea, vomiting or abdominal pain.  Does not report any constipation or diarrhea.  Does not report any skeletal complaints.    Does not report frequency, urgency or hematuria. Does not report any lymphadenopathy.  Her mood is stable.  Remaining review of systems is negative.    Medications: I have reviewed the patient's current medications.  Current Outpatient Medications  Medication Sig Dispense Refill  . ALPRAZolam (XANAX) 0.25 MG tablet TAKE 1 TABLET BY MOUTH EVERY DAY AS NEEDED 30 tablet 0  . amLODipine (NORVASC) 10 MG tablet Take 1 tablet (10 mg total) by mouth daily.    Marland Kitchen aspirin EC 81 MG tablet Take 81 mg by mouth daily.    Marland Kitchen buPROPion (WELLBUTRIN SR) 150 MG 12 hr tablet Take 150 mg by mouth daily.    . calcium carbonate (OS-CAL) 600 MG TABS Take 600 mg by mouth 2 (two) times daily with a  meal.    . candesartan (ATACAND) 16 MG tablet Take 1 tablet (16 mg total) by mouth daily. 90 tablet 3  . cholecalciferol (VITAMIN D) 1000 UNITS tablet Take 1,000 Units by mouth daily.    . Cranberry 12600 MG CAPS Take 12,600 mg by mouth as needed.    Marland Kitchen dextromethorphan-guaiFENesin (MUCINEX DM) 30-600 MG 12hr tablet Take 1 tablet by mouth 2 (two) times daily. 14 tablet 1  . diphenhydramine-acetaminophen (TYLENOL PM) 25-500 MG TABS tablet Take 1 tablet by mouth at bedtime as needed.     . fish oil-omega-3 fatty acids 1000 MG capsule Take 1 g by mouth daily.    . furosemide (LASIX) 20 MG tablet Take 20 mg by mouth as needed for fluid or edema.     Marland Kitchen levothyroxine (SYNTHROID, LEVOTHROID) 75 MCG tablet Take 1 tablet (75 mcg total) by mouth daily before breakfast. 30 tablet 0  . lidocaine (LIDODERM) 5 % Place 1 patch onto the skin daily. Remove & Discard patch within 12 hours or as directed by MD 30 patch 0  . loratadine (CLARITIN) 10 MG tablet Take 1 tablet (10 mg total) by mouth daily. 30 tablet 0  . Multiple Vitamin (MULTIVITAMIN WITH MINERALS) TABS tablet Take 1 tablet by mouth daily.    . nitrofurantoin (MACRODANTIN) 50 MG capsule Take 50 mg by mouth daily.    Marland Kitchen omeprazole (PRILOSEC) 40 MG capsule Take 40 mg by mouth daily.    . sennosides-docusate sodium (SENOKOT-S) 8.6-50 MG tablet  Take 2 tablets by mouth daily as needed for constipation.    . TYMLOS 3120 MCG/1.56ML SOPN Inject 80 mcg as directed daily.      No current facility-administered medications for this visit.      Allergies: No Known Allergies  Past Medical History, Surgical history, Social history, and Family History were reviewed and updated.    Physical Exam: Blood pressure (!) 153/57, pulse 79, temperature 97.7 F (36.5 C), temperature source Oral, resp. rate 18, height _0  (1.626 m), weight 162 lb 1.6 oz (73.5 kg), SpO2 100 %.   ECOG: 1 General appearance: alert and cooperative appeared without distress. Head:  Normocephalic, without obvious abnormality Oropharynx: No oral thrush or ulcers. Eyes: No scleral icterus.  Pupils are equal and round reactive to light. Lymph nodes: Cervical, supraclavicular, and axillary nodes normal. Heart:regular rate and rhythm, S1, S2 normal, no murmur.  Bilateral lower coronary extremity edema noted. Lung:chest clear, no wheezing, rales, normal symmetric air entry Abdomin: soft, non-tender, without masses or organomegaly. Neurological: No motor, sensory deficits.  Intact deep tendon reflexes. Skin: No rashes or lesions.  No ecchymosis or petechiae. Musculoskeletal: No joint deformity or effusion. Psychiatric: Mood and affect are appropriate.    Lab Results: Lab Results  Component Value Date   WBC 3.4 (L) 08/25/2017   HGB 10.4 (L) 08/25/2017   HCT 31.6 (L) 08/25/2017   MCV 82.9 08/25/2017   PLT 97 (L) 08/25/2017     Chemistry      Component Value Date/Time   NA 130 (L) 08/25/2017 0845   K 4.6 08/25/2017 0845   CL 94 (L) 08/25/2017 0845   CO2 27 08/25/2017 0845   BUN 18 08/25/2017 0845   CREATININE 0.71 08/25/2017 0845      Component Value Date/Time   CALCIUM 10.0 08/25/2017 0845   ALKPHOS 103 12/21/2016 2022   AST 38 (H) 06/23/2017 0810   ALT 27 06/23/2017 0810   BILITOT 0.4 06/23/2017 0810         Impression and Plan:   84 year old woman with:   1.    Fluctuating thrombocytopenia noted as far back as 2013.  She was evaluated in January of 2019 after her platelet count was around 80,000.  Labs obtained on August 25, 2017 showed a platelet count of 97,000.  Platelet count from today continues to be relatively stable around 85.  She remains asymptomatic from these findings without any acute need for intervention.  The differential diagnosis was reviewed again which include reactive findings, autoimmune versus early myelodysplasia.  I recommended continued observation and surveillance for the time being.  2.  Mild anemia: Hemoglobin  relatively stable without any symptoms.  Her workup is currently ongoing.  Plasma cell disorder, chronic disease as well as vitamin deficiency will be evaluated.  Her hemoglobin today continues to be stable without any recent symptoms.   I recommended continued observation and surveillance and consider bone marrow biopsy if she develops any further cytopenias.  Bone marrow biopsy right now we will not alter her management given the mild cytopenias and lack of symptoms.  3.  Follow-up: We will be in 6 months to follow her progress.  15  minutes was spent with the patient face-to-face today.  More than 50% of time was dedicated to patient counseling, education and coordination her multifaceted care.     Zola Button, MD 4/2/20192:53 PM

## 2017-10-26 LAB — ERYTHROPOIETIN: Erythropoietin: 53.5 m[IU]/mL — ABNORMAL HIGH (ref 2.6–18.5)

## 2017-10-27 LAB — MULTIPLE MYELOMA PANEL, SERUM
ALBUMIN SERPL ELPH-MCNC: 3.7 g/dL (ref 2.9–4.4)
ALPHA 1: 0.2 g/dL (ref 0.0–0.4)
Albumin/Glob SerPl: 1.1 (ref 0.7–1.7)
Alpha2 Glob SerPl Elph-Mcnc: 0.8 g/dL (ref 0.4–1.0)
B-Globulin SerPl Elph-Mcnc: 1.1 g/dL (ref 0.7–1.3)
GAMMA GLOB SERPL ELPH-MCNC: 1.2 g/dL (ref 0.4–1.8)
Globulin, Total: 3.4 g/dL (ref 2.2–3.9)
IGA: 206 mg/dL (ref 64–422)
IgG (Immunoglobin G), Serum: 1124 mg/dL (ref 700–1600)
IgM (Immunoglobulin M), Srm: 123 mg/dL (ref 26–217)
M Protein SerPl Elph-Mcnc: 0.2 g/dL — ABNORMAL HIGH
TOTAL PROTEIN ELP: 7.1 g/dL (ref 6.0–8.5)

## 2017-10-28 ENCOUNTER — Non-Acute Institutional Stay: Payer: Medicare Other

## 2017-10-28 VITALS — BP 138/60 | HR 78 | Temp 97.5°F | Ht 64.0 in | Wt 156.0 lb

## 2017-10-28 DIAGNOSIS — Z0000 Encounter for general adult medical examination without abnormal findings: Secondary | ICD-10-CM | POA: Diagnosis not present

## 2017-10-28 MED ORDER — ZOSTER VAC RECOMB ADJUVANTED 50 MCG/0.5ML IM SUSR: 0.5000 mL | Freq: Once | INTRAMUSCULAR | 1 refills | Status: AC

## 2017-10-28 NOTE — Progress Notes (Signed)
Subjective:   [name] [name] is a 84 y.o. female who presents for an Initial Medicare Annual Wellness Visit at [facility]        Objective:    Today's Vitals   10/28/17 1349  BP: 138/60  Pulse: 78  Temp: (!) 97.5 F (36.4 C)  TempSrc: Oral  SpO2: 97%  Weight: 156 lb (70.8 kg)  Height: 5' 4" (1.626 m)  PainSc: 2    Body mass index is 26.78 kg/m.  Advanced Directives 10/28/2017 10/01/2017 12/22/2016 12/22/2016 12/21/2016 10/23/2016 10/21/2016  Does Patient Have a Medical Advance Directive? Yes Yes Yes - Yes Yes Yes  Type of Advance Directive Living will;Healthcare Power of [place];Living will Living will Healthcare Power of [place];Living will [place];Living will [place];Living will  Does patient want to make changes to medical advance directive? No - Patient declined - No - Patient declined - - - No - Patient declined  Copy of [place] in Chart? No - copy requested No - copy requested - No - copy requested - - No - copy requested    Current Medications (verified) Outpatient Encounter Medications as of 10/28/2017  Medication Sig  . ALPRAZolam (XANAX) 0.25 MG tablet TAKE 1 TABLET BY MOUTH EVERY DAY AS NEEDED  . amLODipine (NORVASC) 10 MG tablet Take 1 tablet (10 mg total) by mouth daily.  Marland Kitchen aspirin EC 81 MG tablet Take 81 mg by mouth daily.  Marland Kitchen buPROPion (WELLBUTRIN SR) 150 MG 12 hr tablet Take 150 mg by mouth daily.  . calcium carbonate (OS-CAL) 600 MG TABS Take 600 mg by mouth 2 (two) times daily with a meal.  . candesartan (ATACAND) 16 MG tablet Take 1 tablet (16 mg total) by mouth daily.  . cholecalciferol (VITAMIN D) 1000 UNITS tablet Take 1,000 Units by mouth daily.  . Cranberry 12600 MG CAPS Take 12,600 mg by mouth as needed.  Marland Kitchen dextromethorphan-guaiFENesin (MUCINEX DM) 30-600 MG 12hr tablet Take 1 tablet by mouth 2 (two) times  daily.  . diphenhydramine-acetaminophen (TYLENOL PM) 25-500 MG TABS tablet Take 1 tablet by mouth at bedtime as needed.   . fish oil-omega-3 fatty acids 1000 MG capsule Take 1 g by mouth daily.  . furosemide (LASIX) 20 MG tablet Take 20 mg by mouth as needed for fluid or edema.   Marland Kitchen levothyroxine (SYNTHROID, LEVOTHROID) 75 MCG tablet Take 1 tablet (75 mcg total) by mouth daily before breakfast.  . lidocaine (LIDODERM) 5 % Place 1 patch onto the skin daily. Remove & Discard patch within 12 hours or as directed by MD  . loratadine (CLARITIN) 10 MG tablet Take 1 tablet (10 mg total) by mouth daily.  . Multiple Vitamin (MULTIVITAMIN WITH MINERALS) TABS tablet Take 1 tablet by mouth daily.  . nitrofurantoin (MACRODANTIN) 50 MG capsule Take 50 mg by mouth daily.  Marland Kitchen omeprazole (PRILOSEC) 40 MG capsule Take 40 mg by mouth daily.  . sennosides-docusate sodium (SENOKOT-S) 8.6-50 MG tablet Take 2 tablets by mouth daily as needed for constipation.  . TYMLOS 3120 MCG/1.56ML SOPN Inject 80 mcg as directed daily.    No facility-administered encounter medications on file as of 10/28/2017.     Allergies (verified) Patient has no known allergies.   History: Past Medical History:  Diagnosis Date  . Anemia   . Anxiety   . Chronotropic incompetence 12/2012    potentially medication related; noted on CPET   .  DDD (degenerative disc disease)   . Eczema   . GERD (gastroesophageal reflux disease)   . H/O hiatal hernia   . Hx: UTI (urinary tract infection)   . Hyperlipidemia   . Hypertension   . Hypothyroidism   . Osteopenia   . Septicemia (HCC) 2002   following UTI  . Vertigo, benign positional    Past Surgical History:  Procedure Laterality Date  . BREAST SURGERY     left biopsy  . CATARACT EXTRACTION Right    2 weeks ago  . CPET / MET - PFTS     Consistent with chronotropic incompetence; See attached report in the results section  . DILATION AND CURETTAGE OF UTERUS     x 2  . DOPPLER  ECHOCARDIOGRAPHY  01/10/2013   Normal LV size and function. Normal EF. Air sclerosis but no stenosis  . ESOPHAGOGASTRODUODENOSCOPY  05/04/2012   Procedure: ESOPHAGOGASTRODUODENOSCOPY (EGD);  Surgeon: Robert D Kaplan, MD;  Location: [facility];  Service: Endoscopy;  Laterality: N/A;  . EYE SURGERY     cataract extraction with ILO  ? eye  . IR KYPHO EA ADDL LEVEL THORACIC OR LUMBAR  10/28/2016  . IR KYPHO LUMBAR INC FX REDUCE BONE BX UNI/BIL CANNULATION INC/IMAGING  10/28/2016  . IR KYPHO THORACIC WITH BONE BIOPSY  12/24/2016  . IR RADIOLOGIST EVAL & MGMT  11/11/2016  . KNEE ARTHROSCOPY  2011   right  . TONSILLECTOMY    . TOTAL KNEE ARTHROPLASTY  04/24/2012   Procedure: TOTAL KNEE ARTHROPLASTY;  Surgeon: Frank V Aluisio, MD;  Location: [facility];  Service: Orthopedics;  Laterality: Right;   Family History  Problem Relation Age of Onset  . Lung cancer Father 53       Died at age 54  . Stroke Mother 90       Died in her late 90s.  . Heart attack Mother 90  . Diabetes Mother   . Arthritis Mother    Social History   Socioeconomic History  . Marital status: Widowed    Spouse name: Harold  . Number of children: 3  . Years of education: Not on file  . Highest education level: Not on file  Occupational History  . Occupation: homemaker  Social Needs  . Financial resource strain: Not hard at all  . Food insecurity:    Worry: Never true    Inability: Never true  . Transportation needs:    Medical: No    Non-medical: No  Tobacco Use  . Smoking status: Former Smoker    Years: 0.00    Types: Cigarettes    Last attempt to quit: 05/03/1965    Years since quitting: 52.5  . Smokeless tobacco: Never Used  Substance and Sexual Activity  . Alcohol use: No    Comment: occ glass wine  . Drug use: No  . Sexual activity: Not on file  Lifestyle  . Physical activity:    Days per week: 7 days    Minutes per session: 10 min  . Stress: To some extent  Relationships  . Social connections:     Talks on phone: More than three times a week    Gets together: More than three times a week    Attends religious service: Never    Active member of club or organization: No    Attends meetings of clubs or organizations: Never    Relationship status: Widowed  Other Topics Concern  . Not on file  Social History Narrative     She is a widowed mother of 3.   She is a former smoker, with a distant history having quit in 1966.   She does drink an occasional glass of red wine.   She is just now started to try doing exercise with water aerobics.    Tobacco Counseling Counseling given: Not Answered   Clinical Intake:  Pre-visit preparation completed: No  Pain : 0-10 Pain Score: 2  Pain Type: Chronic pain Pain Location: Back Pain Orientation: Lower Pain Descriptors / Indicators: Aching Pain Onset: More than a month ago Pain Frequency: Constant     Nutritional Risks: None Diabetes: No  How often do you need to have someone help you when you read instructions, pamphlets, or other written materials from your doctor or pharmacy?: 1 - Never What is the last grade level you completed in school?: 1 year college  Interpreter Needed?: No  Information entered by :: Sara Saunders, RN   Activities of Daily Living In your present state of health, do you have any difficulty performing the following activities: 10/28/2017 12/22/2016  Hearing? N Y  Vision? N N  Difficulty concentrating or making decisions? Y Y  Walking or climbing stairs? Y Y  Dressing or bathing? N Y  Doing errands, shopping? Y -  Preparing Food and eating ? N -  Using the Toilet? N -  In the past six months, have you accidently leaked urine? N -  Do you have problems with loss of bowel control? N -  Managing your Medications? Y -  Managing your Finances? Y -  Housekeeping or managing your Housekeeping? Y -  Some recent data might be hidden     Immunizations and Health Maintenance Immunization History    Administered Date(s) Administered  . Influenza, High Dose Seasonal PF 04/26/2016  . Influenza-Unspecified 04/22/2017   Health Maintenance Due  Topic Date Due  . TETANUS/TDAP  07/31/1949  . DEXA SCAN  07/30/1997  . PNA vac Low Risk Adult (1 of 2 - PCV13) 07/30/1997    Patient Care Team: Pandey, Mahima, MD as PCP - General (Internal Medicine)  Indicate any recent Medical Services you may have received from other than [facility] providers in the past year (date may be approximate).     Assessment:   This is a routine wellness examination for Catrina.  Hearing/Vision screen No exam data present  Dietary issues and exercise activities discussed: Current Exercise Habits: The patient does not participate in regular exercise at present, Exercise limited by: orthopedic condition(s)  Goals    None     Depression Screen PHQ 2/9 Scores 10/28/2017 09/14/2017 08/01/2017 06/29/2017  PHQ - 2 Score 1 4 6 2  PHQ- 9 Score - 14 11 9    Fall Risk Fall Risk  10/28/2017  Falls in the past year? No    Is the patient's home free of loose throw rugs in walkways, pet beds, electrical cords, etc?   yes      Grab bars in the bathroom? yes      Handrails on the stairs?   yes      Adequate lighting?   yes    Cognitive Function: Within normal function MMSE - Mini Mental State Exam 09/14/2017 06/29/2017  Not completed: (No Data) -  Orientation to time 5 5  Orientation to Place 5 5  Registration 3 3  Attention/ Calculation 5 5  Recall 3 3  Language- name 2 objects 2 2  Language- repeat 1 1  Language- follow 3   step command 3 3  Language- read & follow direction 1 1  Write a sentence 1 1  Copy design 1 1  Total score 30 30        Screening Tests Health Maintenance  Topic Date Due  . TETANUS/TDAP  07/31/1949  . DEXA SCAN  07/30/1997  . PNA vac Low Risk Adult (1 of 2 - PCV13) 07/30/1997  . INFLUENZA VACCINE  02/23/2018    Qualifies for Shingles Vaccine? Yes, educated and prescription sent to  pharmacy  Cancer Screenings: Lung: Low Dose CT Chest recommended if Age 22-80 years, 30 pack-year currently smoking OR have quit w/in 15years. Patient does not qualify. Breast: Up to date on Mammogram? Yes   Up to date of Bone Density/Dexa? Yes Colorectal: up to dte  Additional Screenings:  Hepatitis C Screening: declined TDAP: checking records of previous provider PNA vaccines: checking records of previous provider     Plan:    I have personally reviewed and addressed the Medicare Annual Wellness questionnaire and have noted the following in the patient's chart:  A. Medical and social history B. Use of alcohol, tobacco or illicit drugs  C. Current medications and supplements D. Functional ability and status E.  Nutritional status F.  Physical activity G. Advance directives H. List of other physicians I.  Hospitalizations, surgeries, and ER visits in previous 12 months J.  [place] to include hearing, vision, cognitive, depression L. Referrals and appointments - none  In addition, I have reviewed and discussed with patient certain preventive protocols, quality metrics, and best practice recommendations. A written personalized care plan for preventive services as well as general preventive health recommendations were provided to patient.  See attached scanned questionnaire for additional information.   Signed,   Tyson Dense, RN Nurse Health Advisor  Patient Concerns: None

## 2017-10-28 NOTE — Patient Instructions (Signed)
[name] , Thank you for taking time to come for your Medicare Wellness Visit. I appreciate your ongoing commitment to your health goals. Please review the following plan we discussed and let me know if I can assist you in the future.   Screening recommendations/referrals: Colonoscopy excluded, you are over age 82 Mammogram excluded, you are over age 82 Bone Density up to date, due 01/06/2018 Recommended yearly ophthalmology/optometry visit for glaucoma screening and checkup Recommended yearly dental visit for hygiene and checkup  Vaccinations: Influenza vaccine up to date, due 2019 fall season Pneumococcal vaccine- we will check with previous doctor for records Tdap vaccine- we will check with previous doctor for records Shingles vaccine due  Advanced directives: Please get living will and health care power of attorney notarized and give us a copy  Conditions/risks identified: none  Next appointment: Dr. Glade LloydPandey 12/21/2017 @ 11am   Preventive Care 65 Years and Older, Female Preventive care refers to lifestyle choices and visits with your health care provider that can promote health and wellness. What does preventive care include?  A yearly physical exam. This is also called an annual well check.  Dental exams once or twice a year.  Routine eye exams. Ask your health care provider how often you should have your eyes checked.  Personal lifestyle choices, including:  Daily care of your teeth and gums.  Regular physical activity.  Eating a healthy diet.  Avoiding tobacco and drug use.  Limiting alcohol use.  Practicing safe sex.  Taking low-dose aspirin every day.  Taking vitamin and mineral supplements as recommended by your health care provider. What happens during an annual well check? The services and screenings done by your health care provider during your annual well check will depend on your age, overall health, lifestyle risk factors, and family history of  disease. Counseling  Your health care provider may ask you questions about your:  Alcohol use.  Tobacco use.  Drug use.  Emotional well-being.  Home and relationship well-being.  Sexual activity.  Eating habits.  History of falls.  Memory and ability to understand (cognition).  Work and work Astronomerenvironment.  Reproductive health. Screening  You may have the following tests or measurements:  Height, weight, and BMI.  Blood pressure.  Lipid and cholesterol levels. These may be checked every 5 years, or more frequently if you are over 82 years old.  Skin check.  Lung cancer screening. You may have this screening every year starting at age 82 if you have a 30-pack-year history of smoking and currently smoke or have quit within the past 15 years.  Fecal occult blood test (FOBT) of the stool. You may have this test every year starting at age 550.  Flexible sigmoidoscopy or colonoscopy. You may have a sigmoidoscopy every 5 years or a colonoscopy every 10 years starting at age 550.  Hepatitis C blood test.  Hepatitis B blood test.  Sexually transmitted disease (STD) testing.  Diabetes screening. This is done by checking your blood sugar (glucose) after you have not eaten for a while (fasting). You may have this done every 1-3 years.  Bone density scan. This is done to screen for osteoporosis. You may have this done starting at age 82.  Mammogram. This may be done every 1-2 years. Talk to your health care provider about how often you should have regular mammograms. Talk with your health care provider about your test results, treatment options, and if necessary, the need for more tests. Vaccines  Your health  care provider may recommend certain vaccines, such as:  Influenza vaccine. This is recommended every year.  Tetanus, diphtheria, and acellular pertussis (Tdap, Td) vaccine. You may need a Td booster every 10 years.  Zoster vaccine. You may need this after age  81.  Pneumococcal 13-valent conjugate (PCV13) vaccine. One dose is recommended after age 1.  Pneumococcal polysaccharide (PPSV23) vaccine. One dose is recommended after age 45. Talk to your health care provider about which screenings and vaccines you need and how often you need them. This information is not intended to replace advice given to you by your health care provider. Make sure you discuss any questions you have with your health care provider. Document Released: 08/08/2015 Document Revised: 03/31/2016 Document Reviewed: 05/13/2015 Elsevier Interactive Patient Education  2017 [place] Prevention in the Home Falls can cause injuries. They can happen to people of all ages. There are many things you can do to make your home safe and to help prevent falls. What can I do on the outside of my home?  Regularly fix the edges of walkways and driveways and fix any cracks.  Remove anything that might make you trip as you walk through a door, such as a raised step or threshold.  Trim any bushes or trees on the path to your home.  Use bright outdoor lighting.  Clear any walking paths of anything that might make someone trip, such as rocks or tools.  Regularly check to see if handrails are loose or broken. Make sure that both sides of any steps have handrails.  Any raised decks and porches should have guardrails on the edges.  Have any leaves, snow, or ice cleared regularly.  Use sand or salt on walking paths during winter.  Clean up any spills in your garage right away. This includes oil or grease spills. What can I do in the bathroom?  Use night lights.  Install grab bars by the toilet and in the tub and shower. Do not use towel bars as grab bars.  Use non-skid mats or decals in the tub or shower.  If you need to sit down in the shower, use a plastic, non-slip stool.  Keep the floor dry. Clean up any water that spills on the floor as soon as it happens.  Remove  soap buildup in the tub or shower regularly.  Attach bath mats securely with double-sided non-slip rug tape.  Do not have throw rugs and other things on the floor that can make you trip. What can I do in the bedroom?  Use night lights.  Make sure that you have a light by your bed that is easy to reach.  Do not use any sheets or blankets that are too big for your bed. They should not hang down onto the floor.  Have a firm chair that has side arms. You can use this for support while you get dressed.  Do not have throw rugs and other things on the floor that can make you trip. What can I do in the kitchen?  Clean up any spills right away.  Avoid walking on wet floors.  Keep items that you use a lot in easy-to-reach places.  If you need to reach something above you, use a strong step stool that has a grab bar.  Keep electrical cords out of the way.  Do not use floor polish or wax that makes floors slippery. If you must use wax, use non-skid floor wax.  Do not have  throw rugs and other things on the floor that can make you trip. What can I do with my stairs?  Do not leave any items on the stairs.  Make sure that there are handrails on both sides of the stairs and use them. Fix handrails that are broken or loose. Make sure that handrails are as long as the stairways.  Check any carpeting to make sure that it is firmly attached to the stairs. Fix any carpet that is loose or worn.  Avoid having throw rugs at the top or bottom of the stairs. If you do have throw rugs, attach them to the floor with carpet tape.  Make sure that you have a light switch at the top of the stairs and the bottom of the stairs. If you do not have them, ask someone to add them for you. What else can I do to help prevent falls?  Wear shoes that:  Do not have high heels.  Have rubber bottoms.  Are comfortable and fit you well.  Are closed at the toe. Do not wear sandals.  If you use a  stepladder:  Make sure that it is fully opened. Do not climb a closed stepladder.  Make sure that both sides of the stepladder are locked into place.  Ask someone to hold it for you, if possible.  Clearly mark and make sure that you can see:  Any grab bars or handrails.  First and last steps.  Where the edge of each step is.  Use tools that help you move around (mobility aids) if they are needed. These include:  Canes.  Walkers.  Scooters.  Crutches.  Turn on the lights when you go into a dark area. Replace any light bulbs as soon as they burn out.  Set up your furniture so you have a clear path. Avoid moving your furniture around.  If any of your floors are uneven, fix them.  If there are any pets around you, be aware of where they are.  Review your medicines with your doctor. Some medicines can make you feel dizzy. This can increase your chance of falling. Ask your doctor what other things that you can do to help prevent falls. This information is not intended to replace advice given to you by your health care provider. Make sure you discuss any questions you have with your health care provider. Document Released: 05/08/2009 Document Revised: 12/18/2015 Document Reviewed: 08/16/2014 Elsevier Interactive Patient Education  2017 [employer].

## 2017-11-15 ENCOUNTER — Other Ambulatory Visit: Payer: Self-pay | Admitting: Internal Medicine

## 2017-11-25 ENCOUNTER — Telehealth: Payer: Self-pay | Admitting: *Deleted

## 2017-11-25 NOTE — Telephone Encounter (Signed)
If her losartan manufacturing company was not on recall list, she can resume taking her losartan and stop candesartan. If she has taken her candesartan already today, stop taking it from tomorrow. Resume losartan from tomorrow.

## 2017-11-25 NOTE — Telephone Encounter (Signed)
Patient's caregiver notified and verbalized understanding 

## 2017-11-25 NOTE — Telephone Encounter (Signed)
FYI- Caregiver called in reference to candesartan Rx. This was changed in March due to a recall on the losartan. Patient received a mail order supply of losartan this week and was advised that the manufacturer the mail order uses was not included in the recall. Caregiver was asking if patient should finish up current supply of candesartan and then resume the losartan. I advised to stay on the candesartan for now and we would address this again at her next appointment.

## 2017-11-29 ENCOUNTER — Other Ambulatory Visit: Payer: Self-pay | Admitting: *Deleted

## 2017-11-29 MED ORDER — LOSARTAN POTASSIUM 100 MG PO TABS: 100.0000 mg | ORAL_TABLET | Freq: Every day | ORAL | 0 refills | Status: DC

## 2017-12-06 ENCOUNTER — Other Ambulatory Visit: Payer: Self-pay | Admitting: Internal Medicine

## 2017-12-06 DIAGNOSIS — F321 Major depressive disorder, single episode, moderate: Secondary | ICD-10-CM

## 2017-12-12 DIAGNOSIS — I5032 Chronic diastolic (congestive) heart failure: Secondary | ICD-10-CM

## 2017-12-12 DIAGNOSIS — E039 Hypothyroidism, unspecified: Secondary | ICD-10-CM

## 2017-12-12 DIAGNOSIS — I110 Hypertensive heart disease with heart failure: Secondary | ICD-10-CM

## 2017-12-12 DIAGNOSIS — D61818 Other pancytopenia: Secondary | ICD-10-CM

## 2017-12-12 LAB — TSH: TSH: 2.63 mIU/L (ref 0.40–4.50)

## 2017-12-12 LAB — CBC
HCT: 34.6 % — ABNORMAL LOW (ref 35.0–45.0)
HEMOGLOBIN: 11.7 g/dL (ref 11.7–15.5)
MCH: 29.1 pg (ref 27.0–33.0)
MCHC: 33.8 g/dL (ref 32.0–36.0)
MCV: 86.1 fL (ref 80.0–100.0)
Platelets: 86 10*3/uL — ABNORMAL LOW (ref 140–400)
RBC: 4.02 10*6/uL (ref 3.80–5.10)
RDW: 15.7 % — ABNORMAL HIGH (ref 11.0–15.0)
WBC: 5.1 10*3/uL (ref 3.8–10.8)

## 2017-12-12 LAB — COMPLETE METABOLIC PANEL WITH GFR
AG Ratio: 1.4 (calc) (ref 1.0–2.5)
ALBUMIN MSPROF: 4.6 g/dL (ref 3.6–5.1)
ALKALINE PHOSPHATASE (APISO): 91 U/L (ref 33–130)
ALT: 17 U/L (ref 6–29)
AST: 29 U/L (ref 10–35)
BILIRUBIN TOTAL: 0.4 mg/dL (ref 0.2–1.2)
BUN: 22 mg/dL (ref 7–25)
CHLORIDE: 98 mmol/L (ref 98–110)
CO2: 28 mmol/L (ref 20–32)
Calcium: 10.3 mg/dL (ref 8.6–10.4)
Creat: 0.86 mg/dL (ref 0.60–0.88)
GFR, Est African American: 71 mL/min/{1.73_m2} (ref >=60)
GFR, Est Non African American: 62 mL/min/{1.73_m2} (ref >=60)
GLOBULIN: 3.2 g/dL (ref 1.9–3.7)
Glucose, Bld: 99 mg/dL (ref 65–99)
Potassium: 4.7 mmol/L (ref 3.5–5.3)
SODIUM: 135 mmol/L (ref 135–146)
Total Protein: 7.8 g/dL (ref 6.1–8.1)

## 2017-12-12 LAB — LIPID PANEL
CHOLESTEROL: 212 mg/dL — AB (ref <200)
HDL: 72 mg/dL (ref >50)
LDL CHOLESTEROL (CALC): 121 mg/dL — AB
Non-HDL Cholesterol (Calc): 140 mg/dL (calc) — ABNORMAL HIGH (ref <130)
TRIGLYCERIDES: 88 mg/dL (ref <150)
Total CHOL/HDL Ratio: 2.9 (calc) (ref <5.0)

## 2017-12-14 ENCOUNTER — Other Ambulatory Visit: Payer: Self-pay | Admitting: *Deleted

## 2017-12-21 ENCOUNTER — Encounter: Payer: Self-pay | Admitting: Internal Medicine

## 2017-12-21 ENCOUNTER — Non-Acute Institutional Stay: Payer: Medicare Other | Admitting: Internal Medicine

## 2017-12-21 VITALS — BP 156/78 | HR 83 | Temp 97.6°F | Resp 14 | Ht 64.0 in | Wt 153.2 lb

## 2017-12-21 DIAGNOSIS — M545 Low back pain, unspecified: Secondary | ICD-10-CM | POA: Insufficient documentation

## 2017-12-21 DIAGNOSIS — R5383 Other fatigue: Secondary | ICD-10-CM

## 2017-12-21 DIAGNOSIS — K5909 Other constipation: Secondary | ICD-10-CM | POA: Diagnosis not present

## 2017-12-21 DIAGNOSIS — G8929 Other chronic pain: Secondary | ICD-10-CM

## 2017-12-21 DIAGNOSIS — F418 Other specified anxiety disorders: Secondary | ICD-10-CM | POA: Diagnosis not present

## 2017-12-21 DIAGNOSIS — E039 Hypothyroidism, unspecified: Secondary | ICD-10-CM

## 2017-12-21 DIAGNOSIS — I739 Peripheral vascular disease, unspecified: Secondary | ICD-10-CM | POA: Insufficient documentation

## 2017-12-21 DIAGNOSIS — R2681 Unsteadiness on feet: Secondary | ICD-10-CM | POA: Diagnosis not present

## 2017-12-21 MED ORDER — TRAMADOL HCL 50 MG PO TABS: 50.0000 mg | ORAL_TABLET | Freq: Every day | ORAL | 0 refills | Status: DC

## 2017-12-21 MED ORDER — ACETAMINOPHEN 500 MG PO TABS: 1000.0000 mg | ORAL_TABLET | Freq: Two times a day (BID) | ORAL | 3 refills | Status: DC

## 2017-12-21 MED ORDER — BUPROPION HCL ER (SR) 150 MG PO TB12: 150.0000 mg | ORAL_TABLET | Freq: Two times a day (BID) | ORAL | 3 refills | Status: DC

## 2017-12-21 NOTE — Patient Instructions (Addendum)
  You need to wear compression stockings daily- have them on before getting out of bed in the morning if possible and remove it at bedtime before going to bed. This should help with swelling in your leg.   Take lasix 20 mg daily for now to help with swelling.  Increase wellbutrin SR to 150 mg twice a day from once a day for your mood.  Take tylenol 1000 mg in morning and noon and tramadol 50 mg at bedtime for back pain. Do not take any other pain medication.   Stop taking lidocaine patch.   Please bring the pill bottle of your xanax on next visit.

## 2017-12-21 NOTE — Progress Notes (Signed)
[name]  Provider: Blanchie Serve MD   Location:  [place] of Service:  Clinic (12)  PCP: [name] Serve, MD Patient Care Team: [name] Serve, MD as PCP - General (Internal Medicine)  Extended Emergency Contact Information Primary Emergency Contact: [name] [name] Address: [place], [place] [address] [name] [name] of [place] Phone: [phone] Mobile Phone: [phone] Relation: Son Secondary Emergency Contact: [name] [name]  [place] Phone: [phone] Relation: None   Goals of Care: Advanced Directive information Advanced Directives 10/28/2017  Does Patient Have a Medical Advance Directive? Yes  Type of Advance Directive Living will;Healthcare Power of Attorney  Does patient want to make changes to medical advance directive? No - Patient declined  Copy of [place] in Chart? No - copy requested      Chief Complaint  Patient presents with  . Medical Management of Chronic Issues    HPI: Patient is a 84 y.o. female seen today for routine visit. She is here with her daughter in law. She complaints of her back pain worsening. Denies any radiation of low back pain to legs. She has history of kyphoplasty and vertebroplasty. No recent fall reported. She has alos noticed increased swelling to her legs. She is tearful. Family have noticed her to be more depressed. No suicidal or homicidal thoughts. She lives in [place] and has aid to help with medication administration 5 days a week.    Depression screen PHQ 2/9 10/28/2017  Decreased Interest 0  Down, Depressed, Hopeless 1  PHQ - 2 Score 1  Altered sleeping -  Tired, decreased energy -  Change in appetite -  Feeling bad or failure about yourself  -  Trouble concentrating -  Moving slowly or fidgety/restless -  Suicidal thoughts -  PHQ-9 Score -  Difficult doing work/chores -    Past Medical History:  Diagnosis Date  . Anemia     . Anxiety   . Chronotropic incompetence 12/2012    potentially medication related; noted on CPET   . DDD (degenerative disc disease)   . Eczema   . GERD (gastroesophageal reflux disease)   . H/O hiatal hernia   . Hx: UTI (urinary tract infection)   . Hyperlipidemia   . Hypertension   . Hypothyroidism   . Osteopenia   . Septicemia ([place]) 2002   following UTI  . Vertigo, benign positional    Past Surgical History:  Procedure Laterality Date  . BREAST SURGERY     left biopsy  . CATARACT EXTRACTION Right    2 weeks ago  . CPET / MET - PFTS     Consistent with chronotropic incompetence; See attached report in the results section  . DILATION AND CURETTAGE OF UTERUS     x 2  . DOPPLER ECHOCARDIOGRAPHY  01/10/2013   Normal LV size and function. Normal EF. Air sclerosis but no stenosis  . ESOPHAGOGASTRODUODENOSCOPY  05/04/2012   Procedure: ESOPHAGOGASTRODUODENOSCOPY (EGD);  Surgeon: [name] Castle, MD;  Location: [facility];  Service: Endoscopy;  Laterality: N/A;  . EYE SURGERY     cataract extraction with ILO  ? eye  . IR KYPHO EA ADDL LEVEL THORACIC OR LUMBAR  10/28/2016  . IR KYPHO LUMBAR INC FX REDUCE BONE BX UNI/BIL CANNULATION INC/IMAGING  10/28/2016  . IR KYPHO THORACIC WITH BONE BIOPSY  12/24/2016  . IR RADIOLOGIST EVAL & MGMT  11/11/2016  .  KNEE ARTHROSCOPY  2011   right  . TONSILLECTOMY    . TOTAL KNEE ARTHROPLASTY  04/24/2012   Procedure: TOTAL KNEE ARTHROPLASTY;  Surgeon: [name] Alf, MD;  Location: [facility];  Service: Orthopedics;  Laterality: Right;    reports that she quit smoking about 52 years ago. Her smoking use included cigarettes. She quit after 0.00 years of use. She has never used smokeless tobacco. She reports that she does not drink alcohol or use drugs. Social History   Socioeconomic History  . Marital status: Widowed    Spouse name: [name] [name]  . Number of children: 3  . Years of education: Not on file  . Highest education level: Not on file  Occupational  History  . Occupation: homemaker  Social Needs  . Financial resource strain: Not hard at all  . Food insecurity:    Worry: Never true    Inability: Never true  . Transportation needs:    Medical: No    Non-medical: No  Tobacco Use  . Smoking status: Former Smoker    Years: 0.00    Types: Cigarettes    Last attempt to quit: 05/03/1965    Years since quitting: 52.6  . Smokeless tobacco: Never Used  Substance and Sexual Activity  . Alcohol use: No    Comment: occ glass wine  . Drug use: No  . Sexual activity: Not on file  Lifestyle  . Physical activity:    Days per week: 7 days    Minutes per session: 10 min  . Stress: To some extent  Relationships  . Social connections:    Talks on phone: More than three times a week    Gets together: More than three times a week    Attends religious service: Never    Active member of club or organization: No    Attends meetings of clubs or organizations: Never    Relationship status: Widowed  . Intimate partner violence:    Fear of current or ex partner: No    Emotionally abused: No    Physically abused: No    Forced sexual activity: No  Other Topics Concern  . Not on file  Social History Narrative   She is a widowed mother of 3.   She is a former smoker, with a distant history having quit in 1966.   She does drink an occasional glass of red wine.   She is just now started to try doing exercise with water aerobics.     Family History  Problem Relation Age of Onset  . Lung cancer Father 64       Died at age 13  . Stroke Mother 32       Died in her late 57s.  Marland Kitchen Heart attack Mother 2  . Diabetes Mother   . Arthritis Mother     Health Maintenance  Topic Date Due  . DEXA SCAN  07/30/1997  . PNA vac Low Risk Adult (2 of 2 - PCV13) 12/21/2014  . TETANUS/TDAP  09/25/2016  . INFLUENZA VACCINE  02/23/2018    No Known Allergies  Outpatient Encounter Medications as of 12/21/2017  Medication Sig  . ALPRAZolam (XANAX) 0.25 MG  tablet TAKE 1 TABLET BY MOUTH EVERY DAY AS NEEDED  . amLODipine (NORVASC) 10 MG tablet Take 1 tablet (10 mg total) by mouth daily.  Marland Kitchen buPROPion (WELLBUTRIN SR) 150 MG 12 hr tablet Take 150 mg by mouth daily.  . calcium carbonate (OS-CAL) 600 MG TABS Take 600  mg by mouth 2 (two) times daily with a meal.  . cholecalciferol (VITAMIN D) 1000 UNITS tablet Take 1,000 Units by mouth daily.  . Cranberry 12600 MG CAPS Take 12,600 mg by mouth as needed.  Marland Kitchen dextromethorphan-guaiFENesin (MUCINEX DM) 30-600 MG 12hr tablet Take 1 tablet by mouth 2 (two) times daily.  . diphenhydramine-acetaminophen (TYLENOL PM) 25-500 MG TABS tablet Take 1 tablet by mouth at bedtime as needed.   . fish oil-omega-3 fatty acids 1000 MG capsule Take 1 g by mouth daily.  . furosemide (LASIX) 20 MG tablet Take 20 mg by mouth as needed for fluid or edema.   Marland Kitchen levothyroxine (SYNTHROID, LEVOTHROID) 75 MCG tablet Take 1 tablet (75 mcg total) by mouth daily before breakfast.  . lidocaine (LIDODERM) 5 % Place 1 patch onto the skin daily. Remove & Discard patch within 12 hours or as directed by MD  . loratadine (CLARITIN) 10 MG tablet TAKE 1 TABLET BY MOUTH EVERY DAY  . losartan (COZAAR) 100 MG tablet Take 1 tablet (100 mg total) by mouth daily.  . Multiple Vitamin (MULTIVITAMIN WITH MINERALS) TABS tablet Take 1 tablet by mouth daily.  . nitrofurantoin (MACRODANTIN) 50 MG capsule Take 50 mg by mouth daily.  Marland Kitchen omeprazole (PRILOSEC) 40 MG capsule Take 40 mg by mouth daily.  . sennosides-docusate sodium (SENOKOT-S) 8.6-50 MG tablet Take 2 tablets by mouth daily as needed for constipation.  . TYMLOS 3120 MCG/1.56ML SOPN Inject 80 mcg as directed daily.   Marland Kitchen aspirin EC 81 MG tablet Take 81 mg by mouth daily.   No facility-administered encounter medications on file as of 12/21/2017.     Review of Systems  Constitutional: Negative for chills and fever.       Appetite is good, poor energy level  HENT: Positive for rhinorrhea. Negative for  congestion, ear pain and sore throat.   Respiratory: Positive for cough and shortness of breath.        Gets short of breath with exertion  Cardiovascular: Positive for leg swelling.       Denies chest pain  Gastrointestinal: Positive for constipation. Negative for abdominal pain and vomiting.  Genitourinary: Negative for dysuria.  Musculoskeletal: Positive for back pain and gait problem.       Uses walker for ambulation, no fall reported   Skin: Negative for rash and wound.  Neurological:       Has chronic vertigo, no new numbness or tingling reported  Hematological:       Has chronic thrombocytopenia  Psychiatric/Behavioral: Positive for decreased concentration and dysphoric mood. Negative for behavioral problems and hallucinations. The patient is nervous/anxious.     Vitals:   12/21/17 1134  BP: (!) 156/78  Pulse: 83  Resp: 14  Temp: 97.6 F (36.4 C)  TempSrc: Oral  SpO2: 99%  Weight: 153 lb 3.2 oz (69.5 kg)  Height: '5\' 4"'$  (1.626 m)   Body mass index is 26.3 kg/m.   Wt Readings from Last 3 Encounters:  12/21/17 153 lb 3.2 oz (69.5 kg)  10/28/17 156 lb (70.8 kg)  10/25/17 162 lb 1.6 oz (73.5 kg)   Physical Exam  Constitutional: She is oriented to person, place, and time. She appears well-developed and well-nourished. No distress.  HENT:  Head: Normocephalic and atraumatic.  Mouth/Throat: Oropharynx is clear and moist. No oropharyngeal exudate.  Eyes: Pupils are equal, round, and reactive to light. Conjunctivae and EOM are normal.  Neck: Normal range of motion. Neck supple.  Cardiovascular: Normal rate and regular rhythm.  Pulmonary/Chest: Effort normal. No respiratory distress. She has no wheezes. She has no rales.  CTAB   Abdominal: Soft. Bowel sounds are normal. There is no tenderness.  Musculoskeletal: She exhibits edema. She exhibits no tenderness.  1+ pitting leg edema upto knee area, unsteady gait, uses walker for ambulation. Generalized weakness    Lymphadenopathy:    She has no cervical adenopathy.  Neurological: She is alert and oriented to person, place, and time. She exhibits normal muscle tone.  Skin: Skin is warm and dry. She is not diaphoretic.  Psychiatric:  Tearful, flat affect    Labs reviewed: Basic Metabolic Panel: Recent Labs    12/23/16 0516  12/26/16 0513  08/25/17 0845 10/25/17 1446 12/09/17 0000  NA 132*   < > 131*   < > 130* 133* 135  K 4.5   < > 4.3   < > 4.6 4.2 4.7  CL 97*   < > 95*   < > 94* 99 98  CO2 26   < > 28   < > _0 GLUCOSE 106*   < > 100*   < > 98 107 99  BUN 15   < > 18   < > 18 28* 22  CREATININE 0.55   < > 0.51   < > 0.71 0.86 0.86  CALCIUM 9.6   < > 9.3   < > 10.0 10.4 10.3  MG 2.0  --  2.1  --   --   --   --    < > = values in this interval not displayed.   Liver Function Tests: Recent Labs    12/21/16 2022 06/23/17 0810 10/25/17 1446 12/09/17 0000  AST 37 38* 30 29  ALT _1 ALKPHOS 103  --  99  --   BILITOT 0.5 0.4 0.3 0.4  PROT 7.7 7.1 7.4 7.8  ALBUMIN 4.6  --  3.9  --    No results for input(s): LIPASE, AMYLASE in the last 8760 hours. No results for input(s): AMMONIA in the last 8760 hours. CBC: Recent Labs    06/23/17 0810 08/25/17 0845 10/25/17 1446 12/09/17 0000  WBC 2.9* 3.4* 5.2 5.1  NEUTROABS 1,018* 1,992 3.1  --   HGB 10.3* 10.4* 10.7* 11.7  HCT 30.6* 31.6* 32.8* 34.6*  MCV 85.5 82.9 87.9 86.1  PLT 76* 97* 84* 86*   Cardiac Enzymes: No results for input(s): CKTOTAL, CKMB, CKMBINDEX, TROPONINI in the last 8760 hours. BNP: Invalid input(s): POCBNP No results found for: HGBA1C Lab Results  Component Value Date   TSH 2.63 12/09/2017   Lab Results  Component Value Date   VITAMINB12 1,333 (H) 10/25/2017   Lab Results  Component Value Date   FOLATE 23.0 10/25/2017   Lab Results  Component Value Date   IRON 94 10/25/2017   TIBC 366 10/25/2017   FERRITIN 102 10/25/2017    Lipid Panel: Recent Labs    06/23/17 0810  12/09/17 0000  CHOL 205* 212*  HDL 95 72  LDLCALC 95 121*  TRIG 63 88  CHOLHDL 2.2 2.9   No results found for: HGBA1C  Procedures since last visit: No results found.  Assessment/Plan  1. Chronic bilateral low back pain without sciatica Add tramadol 50 mg qhs and tylenol extra strength 500 mg 2 tab in am and noon. D/c lidocaine patch and tylenol pm. PT/OT referral - acetaminophen (TYLENOL) 500 MG tablet; Take 2 tablets (1,000 mg total) by mouth 2 (two)  times daily.  Dispense: 60 tablet; Refill: 3 - traMADol (ULTRAM) 50 MG tablet; Take 1 tablet (50 mg total) by mouth at bedtime.  Dispense: 30 tablet; Refill: 0 - Sedimentation Rate; Future  2. Depression with anxiety Increase bupropion from SR 150 mg daily to bid.  - buPROPion (WELLBUTRIN SR) 150 MG 12 hr tablet; Take 1 tablet (150 mg total) by mouth 2 (two) times daily after a meal.  Dispense: 60 tablet; Refill: 3  3. PVD (peripheral vascular disease) ([place]) Ted hose to both legs. Lasix 20 mg daily for now, updated script provided.   4. Chronic constipation With her being started on tramadol, schedule senna-docusate   5. Acquired hypothyroidism Stable TSH. Continue levothyroxine.   6. Fatigue, unspecified type Her depression, uncontrolled pain and hypothyroidism could all be contributing. Stable TSH and H&H. Change to pain management and depression rx as above. Therapy referral. Check ESR  7. Unsteady gait Continue to use walker, will refer to PT and OT for gait training and strengthening exercises  Labs/tests ordered:    Lab Orders     Sedimentation Rate  Next appointment: 3 and half weeks  Communication: reviewed care plan with patient and family    [name] Serve, MD Internal Medicine [address], [place] [address] Cell Phone (Monday-Friday 8 am - 5 pm): [phone] On Call: [phone] and follow prompts after 5 pm and on weekends Office Phone:  [phone] Office Fax: [phone]

## 2017-12-29 ENCOUNTER — Other Ambulatory Visit: Payer: Self-pay | Admitting: Internal Medicine

## 2017-12-30 ENCOUNTER — Telehealth: Payer: Self-pay | Admitting: *Deleted

## 2017-12-30 NOTE — Telephone Encounter (Signed)
PA completed via Cover My Meds for Tramadol. Response is pending.

## 2018-01-04 NOTE — Telephone Encounter (Signed)
Received fax from [facility] and Tramadol was DENIED.  Request Ref #:[id number] ID: [id number] [facility]

## 2018-01-11 ENCOUNTER — Other Ambulatory Visit: Payer: Self-pay

## 2018-01-11 DIAGNOSIS — M545 Low back pain, unspecified: Secondary | ICD-10-CM

## 2018-01-11 DIAGNOSIS — G8929 Other chronic pain: Secondary | ICD-10-CM

## 2018-01-12 ENCOUNTER — Other Ambulatory Visit: Payer: Medicare Other

## 2018-01-12 LAB — SEDIMENTATION RATE: SED RATE: 29 mm/h (ref 0–30)

## 2018-01-14 ENCOUNTER — Emergency Department (HOSPITAL_COMMUNITY): Payer: Medicare Other

## 2018-01-14 ENCOUNTER — Inpatient Hospital Stay (HOSPITAL_COMMUNITY)
Admission: EM | Admit: 2018-01-14 | Discharge: 2018-01-16 | DRG: 565 | Disposition: A | Discharge status: Skilled Nursing Facility | Payer: Medicare Other | Attending: Internal Medicine | Admitting: Internal Medicine

## 2018-01-14 ENCOUNTER — Encounter (HOSPITAL_COMMUNITY): Payer: Self-pay

## 2018-01-14 ENCOUNTER — Other Ambulatory Visit: Payer: Self-pay

## 2018-01-14 DIAGNOSIS — Z96651 Presence of right artificial knee joint: Secondary | ICD-10-CM | POA: Diagnosis not present

## 2018-01-14 DIAGNOSIS — R296 Repeated falls: Secondary | ICD-10-CM | POA: Diagnosis present

## 2018-01-14 DIAGNOSIS — W010XXA Fall on same level from slipping, tripping and stumbling without subsequent striking against object, initial encounter: Secondary | ICD-10-CM | POA: Diagnosis present

## 2018-01-14 DIAGNOSIS — Y92129 Unspecified place in nursing home as the place of occurrence of the external cause: Secondary | ICD-10-CM | POA: Diagnosis not present

## 2018-01-14 DIAGNOSIS — E039 Hypothyroidism, unspecified: Secondary | ICD-10-CM | POA: Diagnosis present

## 2018-01-14 DIAGNOSIS — F418 Other specified anxiety disorders: Secondary | ICD-10-CM | POA: Diagnosis not present

## 2018-01-14 DIAGNOSIS — Z87891 Personal history of nicotine dependence: Secondary | ICD-10-CM

## 2018-01-14 DIAGNOSIS — E785 Hyperlipidemia, unspecified: Secondary | ICD-10-CM | POA: Diagnosis not present

## 2018-01-14 DIAGNOSIS — K219 Gastro-esophageal reflux disease without esophagitis: Secondary | ICD-10-CM | POA: Diagnosis present

## 2018-01-14 DIAGNOSIS — G8929 Other chronic pain: Secondary | ICD-10-CM | POA: Diagnosis not present

## 2018-01-14 DIAGNOSIS — W19XXXA Unspecified fall, initial encounter: Secondary | ICD-10-CM | POA: Diagnosis not present

## 2018-01-14 DIAGNOSIS — T796XXA Traumatic ischemia of muscle, initial encounter: Principal | ICD-10-CM | POA: Diagnosis present

## 2018-01-14 DIAGNOSIS — Z79899 Other long term (current) drug therapy: Secondary | ICD-10-CM

## 2018-01-14 DIAGNOSIS — I10 Essential (primary) hypertension: Secondary | ICD-10-CM | POA: Diagnosis present

## 2018-01-14 DIAGNOSIS — R609 Edema, unspecified: Secondary | ICD-10-CM | POA: Diagnosis present

## 2018-01-14 DIAGNOSIS — M545 Low back pain, unspecified: Secondary | ICD-10-CM | POA: Diagnosis present

## 2018-01-14 DIAGNOSIS — K862 Cyst of pancreas: Secondary | ICD-10-CM | POA: Diagnosis not present

## 2018-01-14 DIAGNOSIS — M549 Dorsalgia, unspecified: Secondary | ICD-10-CM

## 2018-01-14 DIAGNOSIS — Z66 Do not resuscitate: Secondary | ICD-10-CM | POA: Diagnosis present

## 2018-01-14 LAB — URINALYSIS, ROUTINE W REFLEX MICROSCOPIC
Bilirubin Urine: NEGATIVE
Glucose, UA: NEGATIVE mg/dL
KETONES UR: NEGATIVE mg/dL
Nitrite: NEGATIVE
PROTEIN: NEGATIVE mg/dL
Specific Gravity, Urine: >1.046 — ABNORMAL HIGH (ref 1.005–1.030)
pH: 6 (ref 5.0–8.0)

## 2018-01-14 LAB — CBC WITH DIFFERENTIAL/PLATELET
BASOS PCT: 0 %
Basophils Absolute: 0 10*3/uL (ref 0.0–0.1)
EOS PCT: 0 %
Eosinophils Absolute: 0 10*3/uL (ref 0.0–0.7)
HEMATOCRIT: 37.3 % (ref 36.0–46.0)
Hemoglobin: 12.3 g/dL (ref 12.0–15.0)
Lymphocytes Relative: 12 %
Lymphs Abs: 1.2 10*3/uL (ref 0.7–4.0)
MCH: 29 pg (ref 26.0–34.0)
MCHC: 33 g/dL (ref 30.0–36.0)
MCV: 88 fL (ref 78.0–100.0)
MONOS PCT: 11 %
Monocytes Absolute: 1.1 10*3/uL — ABNORMAL HIGH (ref 0.1–1.0)
NEUTROS PCT: 77 %
Neutro Abs: 7.9 10*3/uL — ABNORMAL HIGH (ref 1.7–7.7)
Platelets: 98 10*3/uL — ABNORMAL LOW (ref 150–400)
RBC: 4.24 MIL/uL (ref 3.87–5.11)
RDW: 16.5 % — ABNORMAL HIGH (ref 11.5–15.5)
WBC: 10.2 10*3/uL (ref 4.0–10.5)

## 2018-01-14 LAB — COMPREHENSIVE METABOLIC PANEL
ALT: 26 U/L (ref 14–54)
AST: 60 U/L — ABNORMAL HIGH (ref 15–41)
Albumin: 4.3 g/dL (ref 3.5–5.0)
Alkaline Phosphatase: 85 U/L (ref 38–126)
Anion gap: 9 (ref 5–15)
BUN: 25 mg/dL — AB (ref 6–20)
CHLORIDE: 100 mmol/L — AB (ref 101–111)
CO2: 26 mmol/L (ref 22–32)
Calcium: 9.6 mg/dL (ref 8.9–10.3)
Creatinine, Ser: 0.62 mg/dL (ref 0.44–1.00)
GFR calc Af Amer: >60 mL/min (ref >60)
Glucose, Bld: 135 mg/dL — ABNORMAL HIGH (ref 65–99)
POTASSIUM: 4 mmol/L (ref 3.5–5.1)
SODIUM: 135 mmol/L (ref 135–145)
Total Bilirubin: 0.8 mg/dL (ref 0.3–1.2)
Total Protein: 7.9 g/dL (ref 6.5–8.1)

## 2018-01-14 LAB — CK: CK TOTAL: 1707 U/L — AB (ref 38–234)

## 2018-01-14 LAB — I-STAT CG4 LACTIC ACID, ED: Lactic Acid, Venous: 1.34 mmol/L (ref 0.5–1.9)

## 2018-01-14 MED ORDER — BUPROPION HCL ER (SR) 150 MG PO TB12
150.0000 mg | ORAL_TABLET | Freq: Two times a day (BID) | ORAL | Status: DC
  Administered 2018-01-14 – 2018-01-16 (×4): 150 mg via ORAL
  Filled 2018-01-14 (×4): qty 1

## 2018-01-14 MED ORDER — LEVOTHYROXINE SODIUM 75 MCG PO TABS
75.0000 ug | ORAL_TABLET | Freq: Every day | ORAL | Status: DC
  Administered 2018-01-15 – 2018-01-16 (×2): 75 ug via ORAL
  Filled 2018-01-14 (×2): qty 1

## 2018-01-14 MED ORDER — HYDROMORPHONE HCL 1 MG/ML IJ SOLN
0.5000 mg | Freq: Once | INTRAMUSCULAR | Status: AC
  Administered 2018-01-14: 0.5 mg via INTRAVENOUS
  Filled 2018-01-14: qty 1

## 2018-01-14 MED ORDER — ACETAMINOPHEN 650 MG RE SUPP: 650.0000 mg | Freq: Four times a day (QID) | RECTAL | Status: DC | PRN

## 2018-01-14 MED ORDER — KETOROLAC TROMETHAMINE 15 MG/ML IJ SOLN
15.0000 mg | Freq: Four times a day (QID) | INTRAMUSCULAR | Status: DC | PRN
  Administered 2018-01-14 – 2018-01-15 (×4): 15 mg via INTRAVENOUS
  Filled 2018-01-14 (×4): qty 1

## 2018-01-14 MED ORDER — LOSARTAN POTASSIUM 50 MG PO TABS
100.0000 mg | ORAL_TABLET | Freq: Every day | ORAL | Status: DC
  Administered 2018-01-14 – 2018-01-16 (×3): 100 mg via ORAL
  Filled 2018-01-14 (×3): qty 2

## 2018-01-14 MED ORDER — SODIUM CHLORIDE 0.9 % IV SOLN
INTRAVENOUS | Status: DC
  Administered 2018-01-14 – 2018-01-16 (×4): via INTRAVENOUS

## 2018-01-14 MED ORDER — ALPRAZOLAM 0.25 MG PO TABS: 0.2500 mg | ORAL_TABLET | Freq: Every day | ORAL | Status: DC | PRN

## 2018-01-14 MED ORDER — SENNOSIDES-DOCUSATE SODIUM 8.6-50 MG PO TABS
2.0000 | ORAL_TABLET | Freq: Every day | ORAL | Status: DC
  Filled 2018-01-14: qty 2

## 2018-01-14 MED ORDER — PANTOPRAZOLE SODIUM 40 MG PO TBEC
80.0000 mg | DELAYED_RELEASE_TABLET | Freq: Every day | ORAL | Status: DC
  Administered 2018-01-14 – 2018-01-16 (×3): 80 mg via ORAL
  Filled 2018-01-14 (×3): qty 2

## 2018-01-14 MED ORDER — IOPAMIDOL (ISOVUE-300) INJECTION 61%
INTRAVENOUS | Status: AC
  Filled 2018-01-14: qty 100

## 2018-01-14 MED ORDER — ONDANSETRON HCL 4 MG/2ML IJ SOLN: 4.0000 mg | Freq: Four times a day (QID) | INTRAMUSCULAR | Status: DC | PRN

## 2018-01-14 MED ORDER — FUROSEMIDE 20 MG PO TABS
20.0000 mg | ORAL_TABLET | Freq: Every day | ORAL | Status: DC
  Administered 2018-01-15: 20 mg via ORAL
  Filled 2018-01-14: qty 1

## 2018-01-14 MED ORDER — SODIUM CHLORIDE 0.9 % IV SOLN
Freq: Once | INTRAVENOUS | Status: AC
  Administered 2018-01-14: 10:00:00 via INTRAVENOUS

## 2018-01-14 MED ORDER — ENOXAPARIN SODIUM 40 MG/0.4ML ~~LOC~~ SOLN
40.0000 mg | SUBCUTANEOUS | Status: DC
  Administered 2018-01-14 – 2018-01-15 (×2): 40 mg via SUBCUTANEOUS
  Filled 2018-01-14 (×2): qty 0.4

## 2018-01-14 MED ORDER — OXYCODONE HCL 5 MG PO TABS
5.0000 mg | ORAL_TABLET | ORAL | Status: DC | PRN
  Filled 2018-01-14: qty 1

## 2018-01-14 MED ORDER — OXYCODONE-ACETAMINOPHEN 5-325 MG PO TABS
1.0000 | ORAL_TABLET | Freq: Once | ORAL | Status: AC
  Administered 2018-01-14: 1 via ORAL
  Filled 2018-01-14: qty 1

## 2018-01-14 MED ORDER — AMLODIPINE BESYLATE 10 MG PO TABS
10.0000 mg | ORAL_TABLET | Freq: Every day | ORAL | Status: DC
  Administered 2018-01-14 – 2018-01-16 (×3): 10 mg via ORAL
  Filled 2018-01-14 (×3): qty 1

## 2018-01-14 MED ORDER — ONDANSETRON HCL 4 MG PO TABS: 4.0000 mg | ORAL_TABLET | Freq: Four times a day (QID) | ORAL | Status: DC | PRN

## 2018-01-14 MED ORDER — SODIUM CHLORIDE 0.9 % IV BOLUS
500.0000 mL | Freq: Once | INTRAVENOUS | Status: AC
  Administered 2018-01-14: 500 mL via INTRAVENOUS

## 2018-01-14 MED ORDER — MORPHINE SULFATE (PF) 4 MG/ML IV SOLN
4.0000 mg | Freq: Once | INTRAVENOUS | Status: AC
  Administered 2018-01-14: 4 mg via INTRAVENOUS
  Filled 2018-01-14: qty 1

## 2018-01-14 MED ORDER — ACETAMINOPHEN 325 MG PO TABS
650.0000 mg | ORAL_TABLET | Freq: Four times a day (QID) | ORAL | Status: DC | PRN
  Administered 2018-01-15 – 2018-01-16 (×3): 650 mg via ORAL
  Filled 2018-01-14 (×3): qty 2

## 2018-01-14 MED ORDER — SENNOSIDES-DOCUSATE SODIUM 8.6-50 MG PO TABS
2.0000 | ORAL_TABLET | Freq: Every day | ORAL | Status: DC
  Administered 2018-01-14 – 2018-01-15 (×2): 2 via ORAL
  Filled 2018-01-14 (×2): qty 2

## 2018-01-14 MED ORDER — SODIUM CHLORIDE 0.9 % IV SOLN
Freq: Once | INTRAVENOUS | Status: AC
  Administered 2018-01-14: 16:00:00 via INTRAVENOUS

## 2018-01-14 MED ORDER — IOPAMIDOL (ISOVUE-300) INJECTION 61%
100.0000 mL | Freq: Once | INTRAVENOUS | Status: AC | PRN
  Administered 2018-01-14: 100 mL via INTRAVENOUS

## 2018-01-14 NOTE — ED Notes (Signed)
Pt placed on Purewick 

## 2018-01-14 NOTE — ED Notes (Signed)
Pt desat to 91% after med admin. Pt placed on 2L  O2 and now 99%. Dr Donnald GarrePfeiffer notified

## 2018-01-14 NOTE — ED Notes (Signed)
Patient transported to CT 

## 2018-01-14 NOTE — ED Notes (Signed)
Sips of water given to patient.

## 2018-01-14 NOTE — ED Notes (Signed)
Patient in Xray & CT 

## 2018-01-14 NOTE — H&P (Signed)
History and Physical    Geoffrey Mankin Suhr LPF:[id number] DOB: 1930-07-31 DOA: 01/14/2018  PCP: Blanchie Serve, MD  Patient coming from: Independent living facility, [facility]  Chief Complaint: Fall   HPI: [name] [name] is a 84 y.o. female with medical history significant of hypertension, depression, anxiety, hypothyroidism, chronic back pain who fell last night at the independent living facility.  She states that she was walking with her walker, grabbed a large bottle of water, lost her footing and fell.  She fell around 9 PM and was on the ground until around 7:30 AM.  EMS was called.  She admits to low back pain.  Prior to this, she had been doing well.  No recent illnesses, no fevers, chills, chest pain, abdominal pain, cough, shortness of breath, nausea vomiting or diarrhea.  ED Course: Labs significant for CK 1707.  CT head and cervical spine were negative for acute injuries.  Chest x-ray negative.  Right pelvic, femur, tibia-fibula x-rays without acute fracture.  CT chest abdomen pelvis without acute abnormalities.  Due to continued weakness and pain, patient is referred for further management at [facility]  Review of Systems: As per HPI otherwise 10 point review of systems negative.   Past Medical History:  Diagnosis Date  . Anemia   . Anxiety   . Chronotropic incompetence 12/2012    potentially medication related; noted on CPET   . DDD (degenerative disc disease)   . Eczema   . GERD (gastroesophageal reflux disease)   . H/O hiatal hernia   . Hx: UTI (urinary tract infection)   . Hyperlipidemia   . Hypertension   . Hypothyroidism   . Osteopenia   . Septicemia ([place]) 2002   following UTI  . Vertigo, benign positional     Past Surgical History:  Procedure Laterality Date  . BREAST SURGERY     left biopsy  . CATARACT EXTRACTION Right    2 weeks ago  . CPET / MET - PFTS     Consistent with chronotropic incompetence; See attached report in the results section  .  DILATION AND CURETTAGE OF UTERUS     x 2  . DOPPLER ECHOCARDIOGRAPHY  01/10/2013   Normal LV size and function. Normal EF. Air sclerosis but no stenosis  . ESOPHAGOGASTRODUODENOSCOPY  05/04/2012   Procedure: ESOPHAGOGASTRODUODENOSCOPY (EGD);  Surgeon: Inda Castle, MD;  Location: [facility];  Service: Endoscopy;  Laterality: N/A;  . EYE SURGERY     cataract extraction with ILO  ? eye  . IR KYPHO EA ADDL LEVEL THORACIC OR LUMBAR  10/28/2016  . IR KYPHO LUMBAR INC FX REDUCE BONE BX UNI/BIL CANNULATION INC/IMAGING  10/28/2016  . IR KYPHO THORACIC WITH BONE BIOPSY  12/24/2016  . IR RADIOLOGIST EVAL & MGMT  11/11/2016  . KNEE ARTHROSCOPY  2011   right  . TONSILLECTOMY    . TOTAL KNEE ARTHROPLASTY  04/24/2012   Procedure: TOTAL KNEE ARTHROPLASTY;  Surgeon: Gearlean Alf, MD;  Location: [facility];  Service: Orthopedics;  Laterality: Right;     reports that she quit smoking about 52 years ago. Her smoking use included cigarettes. She quit after 0.00 years of use. She has never used smokeless tobacco. She reports that she does not drink alcohol or use drugs.  No Known Allergies  Family History  Problem Relation Age of Onset  . Lung cancer Father 63       Died at age 5  . Stroke Mother 1  Died in her late 54s.  Marland Kitchen Heart attack Mother 36  . Diabetes Mother   . Arthritis Mother     Prior to Admission medications   Medication Sig Start Date End Date Taking? Authorizing Provider  acetaminophen (TYLENOL) 500 MG tablet Take 2 tablets (1,000 mg total) by mouth 2 (two) times daily. 12/21/17   Blanchie Serve, MD  ALPRAZolam Duanne Moron) 0.25 MG tablet TAKE 1 TABLET BY MOUTH EVERY DAY AS NEEDED 09/26/17   Blanchie Serve, MD  amLODipine (NORVASC) 10 MG tablet Take 1 tablet (10 mg total) by mouth daily. 05/08/12   Rai, Vernelle Emerald, MD  buPROPion (WELLBUTRIN SR) 150 MG 12 hr tablet Take 1 tablet (150 mg total) by mouth 2 (two) times daily after a meal. 12/21/17   Blanchie Serve, MD  calcium carbonate  (OS-CAL) 600 MG TABS Take 600 mg by mouth 2 (two) times daily with a meal.    [provider]  cholecalciferol (VITAMIN D) 1000 UNITS tablet Take 1,000 Units by mouth daily.    [provider]  Cranberry 12600 MG CAPS Take 12,600 mg by mouth as needed.    [provider]  dextromethorphan-guaiFENesin (MUCINEX DM) 30-600 MG 12hr tablet Take 1 tablet by mouth 2 (two) times daily. 10/01/17   Fredia Sorrow, MD  diphenhydramine-acetaminophen (TYLENOL PM) 25-500 MG TABS tablet Take 1 tablet by mouth at bedtime as needed.     [provider]  fish oil-omega-3 fatty acids 1000 MG capsule Take 1 g by mouth daily.    [provider]  furosemide (LASIX) 20 MG tablet Take 20 mg by mouth daily.    [provider]  levothyroxine (SYNTHROID, LEVOTHROID) 75 MCG tablet Take 1 tablet (75 mcg total) by mouth daily before breakfast. 12/27/16   Florencia Reasons, MD  loratadine (CLARITIN) 10 MG tablet TAKE 1 TABLET BY MOUTH EVERY DAY 11/15/17   Blanchie Serve, MD  losartan (COZAAR) 100 MG tablet Take 1 tablet (100 mg total) by mouth daily. 11/29/17   Blanchie Serve, MD  Multiple Vitamin (MULTIVITAMIN WITH MINERALS) TABS tablet Take 1 tablet by mouth daily.    [provider]  nitrofurantoin (MACRODANTIN) 50 MG capsule Take 50 mg by mouth daily.    [provider]  omeprazole (PRILOSEC) 40 MG capsule Take 40 mg by mouth daily.    [provider]  sennosides-docusate sodium (SENOKOT-S) 8.6-50 MG tablet Take 2 tablets by mouth at bedtime.    [provider]  traMADol (ULTRAM) 50 MG tablet Take 1 tablet (50 mg total) by mouth at bedtime. 12/21/17   Blanchie Serve, MD  TYMLOS 3120 MCG/1.56ML SOPN INJECT 80MCG SUBCUTANEOUSLY ONCE DAILY 12/29/17   Blanchie Serve, MD    Physical Exam: Vitals:   01/14/18 1200 01/14/18 1425 01/14/18 1500 01/14/18 1530  BP: (!) 183/72 (!) 193/76 (!) 154/65 (!) 157/58  Pulse: 80 100    Resp: 16 20 (!) 24 (!) 21  Temp:       TempSrc:      SpO2: 100% 100%    Weight:      Height:        Constitutional: NAD, calm, comfortable Eyes: PERRL, lids and conjunctivae normal ENMT: Mucous membranes are moist. Posterior pharynx clear of any exudate or lesions.Normal dentition.  Neck: normal, supple, no masses, no thyromegaly Respiratory: clear to auscultation bilaterally, no wheezing, no crackles. Normal respiratory effort. No accessory muscle use.  Cardiovascular: Regular rate and rhythm, no murmurs / rubs / gallops. No extremity edema. Abdomen:  no tenderness, no masses palpated. No hepatosplenomegaly. Bowel sounds positive.  Musculoskeletal: no clubbing / cyanosis. No joint deformity upper and lower extremities. Good ROM, no contractures. Normal muscle tone.  Skin: no rashes, lesions, ulcers. No induration Neurologic: CN 2-12 grossly intact.  Nonfocal.  Speech clear Psychiatric: Normal judgment and insight. Alert and oriented x 3. Normal mood.   Labs on Admission: I have personally reviewed following labs and imaging studies  CBC: Recent Labs  Lab 01/14/18 0928  WBC 10.2  NEUTROABS 7.9*  HGB 12.3  HCT 37.3  MCV 88.0  PLT 98*   Basic Metabolic Panel: Recent Labs  Lab 01/14/18 0928  NA 135  K 4.0  CL 100*  CO2 26  GLUCOSE 135*  BUN 25*  CREATININE 0.62  CALCIUM 9.6   GFR: Estimated Creatinine Clearance: 49.4 mL/min (by C-G formula based on SCr of 0.62 mg/dL). Liver Function Tests: Recent Labs  Lab 01/14/18 0928  AST 60*  ALT 26  ALKPHOS 85  BILITOT 0.8  PROT 7.9  ALBUMIN 4.3   No results for input(s): LIPASE, AMYLASE in the last 168 hours. No results for input(s): AMMONIA in the last 168 hours. Coagulation Profile: No results for input(s): INR, PROTIME in the last 168 hours. Cardiac Enzymes: Recent Labs  Lab 01/14/18 0928  CKTOTAL 1,707*   BNP (last 3 results) No results for input(s): PROBNP in the last 8760 hours. HbA1C: No results for input(s): HGBA1C in the last 72  hours. CBG: No results for input(s): GLUCAP in the last 168 hours. Lipid Profile: No results for input(s): CHOL, HDL, LDLCALC, TRIG, CHOLHDL, LDLDIRECT in the last 72 hours. Thyroid Function Tests: No results for input(s): TSH, T4TOTAL, FREET4, T3FREE, THYROIDAB in the last 72 hours. Anemia Panel: No results for input(s): VITAMINB12, FOLATE, FERRITIN, TIBC, IRON, RETICCTPCT in the last 72 hours. Urine analysis:    Component Value Date/Time   COLORURINE YELLOW 08/23/2017 1230   APPEARANCEUR CLEAR 08/23/2017 1230   LABSPEC 1.014 08/23/2017 1230   PHURINE 8.0 08/23/2017 1230   GLUCOSEU NEGATIVE 08/23/2017 1230   HGBUR NEGATIVE 08/23/2017 1230   BILIRUBINUR NEGATIVE 12/24/2016 0500   KETONESUR NEGATIVE 08/23/2017 1230   PROTEINUR NEGATIVE 08/23/2017 1230   UROBILINOGEN 0.2 03/24/2015 1527   NITRITE NEGATIVE 12/24/2016 0500   LEUKOCYTESUR TRACE (A) 12/24/2016 0500   Sepsis Labs: !!!!!!!!!!!!!!!!!!!!!!!!!!!!!!!!!!!!!!!!!!!! '@LABRCNTIP' (procalcitonin:4,lacticidven:4) )No results found for this or any previous visit (from the past 240 hour(s)).   Radiological Exams on Admission: Dg Pelvis 1-2 Views  Result Date: 01/14/2018 CLINICAL DATA:  Fall last night onto right side. EXAM: PELVIS - 1-2 VIEW COMPARISON:  None. FINDINGS: There is no evidence of pelvic fracture or diastasis. No pelvic bone lesions are seen. Mild degenerate change of the spine and hips. IMPRESSION: No acute fracture. Electronically Signed   By: Marin Olp M.D.   On: 01/14/2018 10:19   Dg Tibia/fibula Right  Result Date: 01/14/2018 CLINICAL DATA:  Fall last night onto right side with right lower leg pain. EXAM: RIGHT TIBIA AND FIBULA - 2 VIEW COMPARISON:  None. FINDINGS: Right total knee arthroplasty intact. No acute fracture or dislocation. IMPRESSION: No acute findings. Electronically Signed   By: Marin Olp M.D.   On: 01/14/2018 10:22   Ct Head Wo Contrast  Result Date: 01/14/2018 CLINICAL DATA:  Fall last  night landing on right side. EXAM: CT HEAD WITHOUT CONTRAST CT CERVICAL SPINE WITHOUT CONTRAST TECHNIQUE: Multidetector CT imaging of the head and cervical spine was performed following the standard protocol  without intravenous contrast. Multiplanar CT image reconstructions of the cervical spine were also generated. COMPARISON:  None. FINDINGS: CT HEAD FINDINGS Brain: Ventricles, cisterns and other CSF spaces are within normal as there is mild age related atrophic change. Mild to moderate chronic ischemic microvascular disease is present. There is no mass, mass effect, shift of midline structures or acute hemorrhage. No evidence of acute infarction. Vascular: No hyperdense vessel or unexpected calcification. Skull: Normal. Negative for fracture or focal lesion. Sinuses/Orbits: No acute finding. Other: None. CT CERVICAL SPINE FINDINGS Alignment: 2 mm anterior subluxation of C7 on T1 which is degenerative due to moderate facet arthropathy. Skull base and vertebrae: Moderate spondylosis of the cervical spine. Vertebral body heights are maintained. Atlantoaxial articulation is within normal. There is moderate uncovertebral joint spurring and facet arthropathy. There is neural foraminal narrowing at multiple levels due to adjacent bony spurring. There is no acute fracture. Soft tissues and spinal canal: No prevertebral fluid or swelling. No visible canal hematoma. Disc levels: Moderate disc space narrowing at the C5-6 and C6-7 levels. Upper chest: Negative. Other: None. IMPRESSION: No acute brain injury. Mild to moderate chronic ischemic microvascular disease and mild age related atrophic change. No acute cervical spine injury. Moderate spondylosis of the cervical spine with moderate disc disease at the C5-6 and C6-7 levels. Moderate bilateral neural foraminal narrowing at multiple levels due to adjacent bony spurring. Electronically Signed   By: Marin Olp M.D.   On: 01/14/2018 10:13   Ct Chest W Contrast  Result  Date: 01/14/2018 CLINICAL DATA:  84 year old fell from bed.  Abdominal trauma. EXAM: CT CHEST, ABDOMEN, AND PELVIS WITH CONTRAST TECHNIQUE: Multidetector CT imaging of the chest, abdomen and pelvis was performed following the standard protocol during bolus administration of intravenous contrast. CONTRAST:  191m ISOVUE-300 IOPAMIDOL (ISOVUE-300) INJECTION 61% COMPARISON:  12/21/2016 FINDINGS: CT CHEST FINDINGS Cardiovascular: Atherosclerotic calcifications in the coronary arteries and throughout the thoracic aorta. No evidence to suggest aortic aneurysm or dissection. Main pulmonary arteries are patent. Heart size is normal without significant pericardial fluid. Mediastinum/Nodes: Small hiatal hernia. No lymph node enlargement in the chest. Lungs/Pleura: Trachea and mainstem bronchi are patent. Negative for pneumothorax. Scarring at the lung apices, right side greater than left. Scarring and/or atelectasis at both lung bases. No large pleural effusions. 5 mm pleural-based nodule at the right lung base along the right major fissure on sequence 4, image 112. This was present on the study from 12/21/2016 and likely an incidental finding. Peripheral reticular densities in the right upper lung are suggestive for chronic changes, possibly mild fibrosis in these areas. No significant airspace disease or consolidation. Musculoskeletal: Both shoulders are located. Clavicles are intact. Old compression fracture at T9 with vertebral body cement. Old compression fracture at T11 with previous vertebral augmentation. No evidence for a displaced rib fracture. CT ABDOMEN PELVIS FINDINGS Hepatobiliary: Gallbladder is mildly distended without inflammatory changes. Normal appearance of the liver. No biliary dilatation. The main portal venous system is patent. Pancreas: Pancreas is poorly characterized. However, there appear to be a tubular or cystic structure along the top of the pancreatic body or adjacent to the pancreas body on  sequence 2, image 57. This structure measures greater than 2.5 cm. In retrospect, this was present on 12/21/2016 and may have been present on the noncontrast study from 05/26/2010. No acute inflammatory changes involving the pancreas. Spleen: Normal in size without focal abnormality. Adrenals/Urinary Tract: Adrenal glands are unremarkable. Normal appearance of both kidneys without hydronephrosis. No suspicious renal lesions.  Urinary bladder is markedly distended extending into the lower abdomen. Stomach/Bowel: Small hiatal hernia. No evidence for bowel obstruction or focal bowel inflammation. Vascular/Lymphatic: Aorta and visceral arteries are heavily calcified without aneurysm. No lymph node enlargement in the abdomen or pelvis. Reproductive: Uterus and adnexal structures are unremarkable. Other: There is soft tissue in the presacral space on sequence 2, image 100. This area measures roughly 2.9 x 1.8 x 3.2 cm. Similar finding was present on the CT from 10/24/2016 and there may have also been some soft tissue in this area dating back to 05/26/2010. This is likely a benign etiology. There are low-density areas within this soft tissues suggesting there may be a fat or lipoma component. No evidence for adjacent bone destruction. Negative for ascites. Negative for free air. Musculoskeletal: Old compression fractures with vertebral body cement at L1 and L2. Disc space narrowing at L4-L5. No evidence for a new vertebral body fracture. IMPRESSION: No acute abnormalities within the chest, abdomen or pelvis. Urinary bladder distension without hydronephrosis. Indeterminate soft tissue in the presacral space measuring up to 3.2 cm. This has been present for multiple years although there has probably been interval growth. Favor a benign etiology. There is low-density material within this structure and this could represent a benign myelolipoma. **An incidental finding of potential clinical significance has been found. Probable  cystic or tubular structure along the superior aspect of the pancreatic body. This is poorly characterized on this examination but suspect this has been stable for at least 1 year if not longer. Consider further characterization of this area with MRI in 6 months.** Old compression fractures and previous vertebral body augmentation procedures. No evidence for an acute vertebral body compression fracture. Chronic changes and scarring in lungs particularly in the right upper lung and apex regions. Electronically Signed   By: Markus Daft M.D.   On: 01/14/2018 14:04   Ct Cervical Spine Wo Contrast  Result Date: 01/14/2018 CLINICAL DATA:  Fall last night landing on right side. EXAM: CT HEAD WITHOUT CONTRAST CT CERVICAL SPINE WITHOUT CONTRAST TECHNIQUE: Multidetector CT imaging of the head and cervical spine was performed following the standard protocol without intravenous contrast. Multiplanar CT image reconstructions of the cervical spine were also generated. COMPARISON:  None. FINDINGS: CT HEAD FINDINGS Brain: Ventricles, cisterns and other CSF spaces are within normal as there is mild age related atrophic change. Mild to moderate chronic ischemic microvascular disease is present. There is no mass, mass effect, shift of midline structures or acute hemorrhage. No evidence of acute infarction. Vascular: No hyperdense vessel or unexpected calcification. Skull: Normal. Negative for fracture or focal lesion. Sinuses/Orbits: No acute finding. Other: None. CT CERVICAL SPINE FINDINGS Alignment: 2 mm anterior subluxation of C7 on T1 which is degenerative due to moderate facet arthropathy. Skull base and vertebrae: Moderate spondylosis of the cervical spine. Vertebral body heights are maintained. Atlantoaxial articulation is within normal. There is moderate uncovertebral joint spurring and facet arthropathy. There is neural foraminal narrowing at multiple levels due to adjacent bony spurring. There is no acute fracture. Soft  tissues and spinal canal: No prevertebral fluid or swelling. No visible canal hematoma. Disc levels: Moderate disc space narrowing at the C5-6 and C6-7 levels. Upper chest: Negative. Other: None. IMPRESSION: No acute brain injury. Mild to moderate chronic ischemic microvascular disease and mild age related atrophic change. No acute cervical spine injury. Moderate spondylosis of the cervical spine with moderate disc disease at the C5-6 and C6-7 levels. Moderate bilateral neural foraminal narrowing at  multiple levels due to adjacent bony spurring. Electronically Signed   By: Marin Olp M.D.   On: 01/14/2018 10:13   Ct Abdomen Pelvis W Contrast  Result Date: 01/14/2018 CLINICAL DATA:  84 year old fell from bed.  Abdominal trauma. EXAM: CT CHEST, ABDOMEN, AND PELVIS WITH CONTRAST TECHNIQUE: Multidetector CT imaging of the chest, abdomen and pelvis was performed following the standard protocol during bolus administration of intravenous contrast. CONTRAST:  187m ISOVUE-300 IOPAMIDOL (ISOVUE-300) INJECTION 61% COMPARISON:  12/21/2016 FINDINGS: CT CHEST FINDINGS Cardiovascular: Atherosclerotic calcifications in the coronary arteries and throughout the thoracic aorta. No evidence to suggest aortic aneurysm or dissection. Main pulmonary arteries are patent. Heart size is normal without significant pericardial fluid. Mediastinum/Nodes: Small hiatal hernia. No lymph node enlargement in the chest. Lungs/Pleura: Trachea and mainstem bronchi are patent. Negative for pneumothorax. Scarring at the lung apices, right side greater than left. Scarring and/or atelectasis at both lung bases. No large pleural effusions. 5 mm pleural-based nodule at the right lung base along the right major fissure on sequence 4, image 112. This was present on the study from 12/21/2016 and likely an incidental finding. Peripheral reticular densities in the right upper lung are suggestive for chronic changes, possibly mild fibrosis in these areas.  No significant airspace disease or consolidation. Musculoskeletal: Both shoulders are located. Clavicles are intact. Old compression fracture at T9 with vertebral body cement. Old compression fracture at T11 with previous vertebral augmentation. No evidence for a displaced rib fracture. CT ABDOMEN PELVIS FINDINGS Hepatobiliary: Gallbladder is mildly distended without inflammatory changes. Normal appearance of the liver. No biliary dilatation. The main portal venous system is patent. Pancreas: Pancreas is poorly characterized. However, there appear to be a tubular or cystic structure along the top of the pancreatic body or adjacent to the pancreas body on sequence 2, image 57. This structure measures greater than 2.5 cm. In retrospect, this was present on 12/21/2016 and may have been present on the noncontrast study from 05/26/2010. No acute inflammatory changes involving the pancreas. Spleen: Normal in size without focal abnormality. Adrenals/Urinary Tract: Adrenal glands are unremarkable. Normal appearance of both kidneys without hydronephrosis. No suspicious renal lesions. Urinary bladder is markedly distended extending into the lower abdomen. Stomach/Bowel: Small hiatal hernia. No evidence for bowel obstruction or focal bowel inflammation. Vascular/Lymphatic: Aorta and visceral arteries are heavily calcified without aneurysm. No lymph node enlargement in the abdomen or pelvis. Reproductive: Uterus and adnexal structures are unremarkable. Other: There is soft tissue in the presacral space on sequence 2, image 100. This area measures roughly 2.9 x 1.8 x 3.2 cm. Similar finding was present on the CT from 10/24/2016 and there may have also been some soft tissue in this area dating back to 05/26/2010. This is likely a benign etiology. There are low-density areas within this soft tissues suggesting there may be a fat or lipoma component. No evidence for adjacent bone destruction. Negative for ascites. Negative for free  air. Musculoskeletal: Old compression fractures with vertebral body cement at L1 and L2. Disc space narrowing at L4-L5. No evidence for a new vertebral body fracture. IMPRESSION: No acute abnormalities within the chest, abdomen or pelvis. Urinary bladder distension without hydronephrosis. Indeterminate soft tissue in the presacral space measuring up to 3.2 cm. This has been present for multiple years although there has probably been interval growth. Favor a benign etiology. There is low-density material within this structure and this could represent a benign myelolipoma. **An incidental finding of potential clinical significance has been found. Probable cystic  or tubular structure along the superior aspect of the pancreatic body. This is poorly characterized on this examination but suspect this has been stable for at least 1 year if not longer. Consider further characterization of this area with MRI in 6 months.** Old compression fractures and previous vertebral body augmentation procedures. No evidence for an acute vertebral body compression fracture. Chronic changes and scarring in lungs particularly in the right upper lung and apex regions. Electronically Signed   By: Markus Daft M.D.   On: 01/14/2018 14:04   Dg Chest Port 1 View  Result Date: 01/14/2018 CLINICAL DATA:  Fall last night onto right side. EXAM: PORTABLE CHEST 1 VIEW COMPARISON:  10/01/2017 FINDINGS: Lungs are adequately inflated and otherwise clear. Cardiomediastinal silhouette is within normal. No acute fracture. Stable thoracic spine compression fractures. IMPRESSION: No acute findings. Electronically Signed   By: Marin Olp M.D.   On: 01/14/2018 10:20   Dg Femur 1v Right  Result Date: 01/14/2018 CLINICAL DATA:  Fall last night with right-sided pain. EXAM: RIGHT FEMUR 1 VIEW COMPARISON:  None. FINDINGS: Mild degenerate change of the right hip. Right total knee arthroplasty intact and normally located. No evidence of acute fracture or  dislocation. IMPRESSION: No acute findings. Electronically Signed   By: Marin Olp M.D.   On: 01/14/2018 10:23    EKG: Independently reviewed. Normal sinus rhythm   Assessment/Plan Principal Problem:   Fall Active Problems:   Essential hypertension   GERD (gastroesophageal reflux disease)   Depression with anxiety   Chronic bilateral low back pain without sciatica   Fall -CT head and cervical spine without acute findings, x-ray right pelvis, femur, tibia-fibula without acute fractures.  CT chest abdomen pelvis without acute abnormalities. -Pain control  -PT OT   Mild rhabdomyolysis -IVF   HTN -Continue norvasc, cozaar  Depression/anxiety -Continue wellbutrin, xanax   Hypothyroidism -Continue synthroid  Incidental pancreatic cyst -Further eval with MRI in 6 months as outpatient    DVT prophylaxis: Lovenox Code Status: DNR Family Communication: Son at bedside Disposition Plan: Back to independent living facility vs SNF pending PT OT eval  Consults called: None   Admission status: Inpatient   * I certify that at the point of admission it is my clinical judgment that the patient will require inpatient hospital care spanning beyond 2 midnights from the point of admission due to high intensity of service, high risk for further deterioration and high frequency of surveillance required.*   Dessa Phi, DO [facility] [email or web address] Password [facility] 01/14/2018, 4:04 PM

## 2018-01-14 NOTE — ED Notes (Signed)
Pt urinated 250 cc using the Pure-wick

## 2018-01-14 NOTE — ED Provider Notes (Signed)
[facility] DEPT Provider Note   CSN: [id number] Arrival date & time: 01/14/18  1829     History   Chief Complaint Chief Complaint  Patient presents with  . Fall  . Back Pain    HPI [name] [name] is a 84 y.o. female.  HPI Patient reports she was getting ready for bed and trying to use her walker while carrying a water bottle.  She reports she lost her balance and fell.  She reports she hit very hard on the right side of her body.  She was unable to get back up.  She reports she had tried to call for help but nobody could hear her.  She reports she had to spend the night on the floor until she was found this morning.  She reports she has a lot of pain in her lower back and right hip.  She reports she struck very hard on the floor in that area.  She denies loss of consciousness.  She reports recalling all events.  No headache.  She endorses some neck pain.  No shortness of breath or chest pain. Past Medical History:  Diagnosis Date  . Anemia   . Anxiety   . Chronotropic incompetence 12/2012    potentially medication related; noted on CPET   . DDD (degenerative disc disease)   . Eczema   . GERD (gastroesophageal reflux disease)   . H/O hiatal hernia   . Hx: UTI (urinary tract infection)   . Hyperlipidemia   . Hypertension   . Hypothyroidism   . Osteopenia   . Septicemia ([place]) 2002   following UTI  . Vertigo, benign positional     Patient Active Problem List   Diagnosis Date Noted  . PVD (peripheral vascular disease) ([place]) 12/21/2017  . Chronic bilateral low back pain without sciatica 12/21/2017  . Current mild episode of major depressive disorder ([place]) 08/01/2017  . Pancytopenia ([place]) 06/29/2017  . Depression, major, single episode, moderate ([place]) 06/29/2017  . Osteoporosis 05/25/2017  . Acquired hypothyroidism 05/25/2017  . B12 deficiency 05/25/2017  . Recurrent cystitis 05/25/2017  . Chronic back pain 05/25/2017  . GERD  (gastroesophageal reflux disease) 12/22/2016  . Depression with anxiety 12/22/2016  . Acute pyelonephritis 12/21/2016  . Chronic constipation 10/26/2016  . Compression fracture of L2 lumbar vertebra, closed, initial encounter ([place]) 10/21/2016  . Hyponatremia 10/21/2016  . Thrombocytopenia ([place]) 10/21/2016  . Hypertensive heart disease 05/02/2016  . Bilateral leg edema 04/30/2016  . Exertional dyspnea 09/29/2013  . Essential hypertension 01/29/2013  . Fatigue with decreased exercise tolerance 12/25/2012  . Chronotropic incompetence 12/24/2012  . Aortic ejection murmur 12/22/2012  . Esophagitis 05/04/2012  . GI bleed 05/03/2012  . Anemia 05/03/2012  . Postop Transfusion 04/27/2012  . OA (osteoarthritis) of knee 04/24/2012    Past Surgical History:  Procedure Laterality Date  . BREAST SURGERY     left biopsy  . CATARACT EXTRACTION Right    2 weeks ago  . CPET / MET - PFTS     Consistent with chronotropic incompetence; See attached report in the results section  . DILATION AND CURETTAGE OF UTERUS     x 2  . DOPPLER ECHOCARDIOGRAPHY  01/10/2013   Normal LV size and function. Normal EF. Air sclerosis but no stenosis  . ESOPHAGOGASTRODUODENOSCOPY  05/04/2012   Procedure: ESOPHAGOGASTRODUODENOSCOPY (EGD);  Surgeon: Inda Castle, MD;  Location: [facility];  Service: Endoscopy;  Laterality: N/A;  . EYE SURGERY     cataract  extraction with ILO  ? eye  . IR KYPHO EA ADDL LEVEL THORACIC OR LUMBAR  10/28/2016  . IR KYPHO LUMBAR INC FX REDUCE BONE BX UNI/BIL CANNULATION INC/IMAGING  10/28/2016  . IR KYPHO THORACIC WITH BONE BIOPSY  12/24/2016  . IR RADIOLOGIST EVAL & MGMT  11/11/2016  . KNEE ARTHROSCOPY  2011   right  . TONSILLECTOMY    . TOTAL KNEE ARTHROPLASTY  04/24/2012   Procedure: TOTAL KNEE ARTHROPLASTY;  Surgeon: Gearlean Alf, MD;  Location: [facility];  Service: Orthopedics;  Laterality: Right;     OB History   None      Home Medications    Prior to Admission medications    Medication Sig Start Date End Date Taking? Authorizing Provider  acetaminophen (TYLENOL) 500 MG tablet Take 2 tablets (1,000 mg total) by mouth 2 (two) times daily. 12/21/17   Blanchie Serve, MD  ALPRAZolam Duanne Moron) 0.25 MG tablet TAKE 1 TABLET BY MOUTH EVERY DAY AS NEEDED 09/26/17   Blanchie Serve, MD  amLODipine (NORVASC) 10 MG tablet Take 1 tablet (10 mg total) by mouth daily. 05/08/12   Rai, Vernelle Emerald, MD  buPROPion (WELLBUTRIN SR) 150 MG 12 hr tablet Take 1 tablet (150 mg total) by mouth 2 (two) times daily after a meal. 12/21/17   Blanchie Serve, MD  calcium carbonate (OS-CAL) 600 MG TABS Take 600 mg by mouth 2 (two) times daily with a meal.    [provider]  cholecalciferol (VITAMIN D) 1000 UNITS tablet Take 1,000 Units by mouth daily.    [provider]  Cranberry 12600 MG CAPS Take 12,600 mg by mouth as needed.    [provider]  dextromethorphan-guaiFENesin (MUCINEX DM) 30-600 MG 12hr tablet Take 1 tablet by mouth 2 (two) times daily. 10/01/17   Fredia Sorrow, MD  diphenhydramine-acetaminophen (TYLENOL PM) 25-500 MG TABS tablet Take 1 tablet by mouth at bedtime as needed.     [provider]  fish oil-omega-3 fatty acids 1000 MG capsule Take 1 g by mouth daily.    [provider]  furosemide (LASIX) 20 MG tablet Take 20 mg by mouth daily.    [provider]  levothyroxine (SYNTHROID, LEVOTHROID) 75 MCG tablet Take 1 tablet (75 mcg total) by mouth daily before breakfast. 12/27/16   Florencia Reasons, MD  loratadine (CLARITIN) 10 MG tablet TAKE 1 TABLET BY MOUTH EVERY DAY 11/15/17   Blanchie Serve, MD  losartan (COZAAR) 100 MG tablet Take 1 tablet (100 mg total) by mouth daily. 11/29/17   Blanchie Serve, MD  Multiple Vitamin (MULTIVITAMIN WITH MINERALS) TABS tablet Take 1 tablet by mouth daily.    [provider]  nitrofurantoin (MACRODANTIN) 50 MG capsule Take 50 mg by mouth daily.    [provider]  omeprazole (PRILOSEC) 40 MG  capsule Take 40 mg by mouth daily.    [provider]  sennosides-docusate sodium (SENOKOT-S) 8.6-50 MG tablet Take 2 tablets by mouth at bedtime.    [provider]  traMADol (ULTRAM) 50 MG tablet Take 1 tablet (50 mg total) by mouth at bedtime. 12/21/17   Blanchie Serve, MD  TYMLOS 3120 MCG/1.56ML SOPN INJECT 80MCG SUBCUTANEOUSLY ONCE DAILY 12/29/17   Blanchie Serve, MD    Family History Family History  Problem Relation Age of Onset  . Lung cancer Father 34       Died at age 102  . Stroke Mother 5       Died in her late 53s.  Marland Kitchen  Heart attack Mother 10  . Diabetes Mother   . Arthritis Mother     Social History Social History   Tobacco Use  . Smoking status: Former Smoker    Years: 0.00    Types: Cigarettes    Last attempt to quit: 05/03/1965    Years since quitting: 52.7  . Smokeless tobacco: Never Used  Substance Use Topics  . Alcohol use: No    Comment: occ glass wine  . Drug use: No     Allergies   Patient has no known allergies.   Review of Systems Review of Systems 10 Systems reviewed and are negative for acute change except as noted in the HPI.   Physical Exam Updated Vital Signs BP (!) 193/76 (BP Location: Right Arm)   Pulse 100   Temp 97.9 F (36.6 C) (Oral)   Resp 20   Ht _0  (1.626 m)   Wt 70.3 kg (155 lb)   SpO2 100%   BMI 26.61 kg/m   Physical Exam  Constitutional: She is oriented to person, place, and time. She appears well-developed and well-nourished.  Patient is alert and interactive.  Clear mental status.  No respiratory distress.  HENT:  Head: Normocephalic and atraumatic.  Right Ear: External ear normal.  Left Ear: External ear normal.  Nose: Nose normal.  Mouth/Throat: Oropharynx is clear and moist.  Eyes: Pupils are equal, round, and reactive to light. EOM are normal.  Neck: Neck supple.  Patient endorses some cervical spine tenderness to palpation at approximately see 3 C4  Cardiovascular: Normal rate, regular  rhythm, normal heart sounds and intact distal pulses.  Pulmonary/Chest: Effort normal and breath sounds normal. She exhibits no tenderness.  Abdominal: Soft. Bowel sounds are normal. She exhibits no distension. There is no tenderness.  Musculoskeletal: She exhibits no edema.  Patient has pain over the lateral right hip.  Pain with any range of motion of the right lower leg.  Patient has diffuse erythema over right lateral lower leg.  Subtle abrasion over right lateral malleolus.  Neurological: She is alert and oriented to person, place, and time. She has normal strength. Coordination normal. GCS eye subscore is 4. GCS verbal subscore is 5. GCS motor subscore is 6.  Skin: Skin is warm, dry and intact.  Psychiatric: She has a normal mood and affect.     ED Treatments / Results  Labs (all labs ordered are listed, but only abnormal results are displayed) Labs Reviewed  COMPREHENSIVE METABOLIC PANEL - Abnormal; Notable for the following components:      Result Value   Chloride 100 (*)    Glucose, Bld 135 (*)    BUN 25 (*)    AST 60 (*)    All other components within normal limits  CBC WITH DIFFERENTIAL/PLATELET - Abnormal; Notable for the following components:   RDW 16.5 (*)    Platelets 98 (*)    Neutro Abs 7.9 (*)    Monocytes Absolute 1.1 (*)    All other components within normal limits  CK - Abnormal; Notable for the following components:   Total CK 1,707 (*)    All other components within normal limits  URINALYSIS, ROUTINE W REFLEX MICROSCOPIC  I-STAT CG4 LACTIC ACID, ED    EKG EKG Interpretation  Date/Time:  Saturday January 14 2018 10:24:15 EDT Ventricular Rate:  79 PR Interval:    QRS Duration: 114 QT Interval:  405 QTC Calculation: 465 R Axis:   -136 Text Interpretation:  Sinus rhythm Borderline  intraventricular conduction delay Low voltage, precordial leads Baseline wander in lead(s) I aVR V2 no change frrom previous Confirmed by Charlesetta Shanks [phone]) on 01/14/2018  10:30:09 AM Also confirmed by Charlesetta Shanks [phone]), editor Laurena Spies [phone])  on 01/14/2018 11:46:25 AM   Radiology Dg Pelvis 1-2 Views  Result Date: 01/14/2018 CLINICAL DATA:  Fall last night onto right side. EXAM: PELVIS - 1-2 VIEW COMPARISON:  None. FINDINGS: There is no evidence of pelvic fracture or diastasis. No pelvic bone lesions are seen. Mild degenerate change of the spine and hips. IMPRESSION: No acute fracture. Electronically Signed   By: Marin Olp M.D.   On: 01/14/2018 10:19   Dg Tibia/fibula Right  Result Date: 01/14/2018 CLINICAL DATA:  Fall last night onto right side with right lower leg pain. EXAM: RIGHT TIBIA AND FIBULA - 2 VIEW COMPARISON:  None. FINDINGS: Right total knee arthroplasty intact. No acute fracture or dislocation. IMPRESSION: No acute findings. Electronically Signed   By: Marin Olp M.D.   On: 01/14/2018 10:22   Ct Head Wo Contrast  Result Date: 01/14/2018 CLINICAL DATA:  Fall last night landing on right side. EXAM: CT HEAD WITHOUT CONTRAST CT CERVICAL SPINE WITHOUT CONTRAST TECHNIQUE: Multidetector CT imaging of the head and cervical spine was performed following the standard protocol without intravenous contrast. Multiplanar CT image reconstructions of the cervical spine were also generated. COMPARISON:  None. FINDINGS: CT HEAD FINDINGS Brain: Ventricles, cisterns and other CSF spaces are within normal as there is mild age related atrophic change. Mild to moderate chronic ischemic microvascular disease is present. There is no mass, mass effect, shift of midline structures or acute hemorrhage. No evidence of acute infarction. Vascular: No hyperdense vessel or unexpected calcification. Skull: Normal. Negative for fracture or focal lesion. Sinuses/Orbits: No acute finding. Other: None. CT CERVICAL SPINE FINDINGS Alignment: 2 mm anterior subluxation of C7 on T1 which is degenerative due to moderate facet arthropathy. Skull base and vertebrae: Moderate  spondylosis of the cervical spine. Vertebral body heights are maintained. Atlantoaxial articulation is within normal. There is moderate uncovertebral joint spurring and facet arthropathy. There is neural foraminal narrowing at multiple levels due to adjacent bony spurring. There is no acute fracture. Soft tissues and spinal canal: No prevertebral fluid or swelling. No visible canal hematoma. Disc levels: Moderate disc space narrowing at the C5-6 and C6-7 levels. Upper chest: Negative. Other: None. IMPRESSION: No acute brain injury. Mild to moderate chronic ischemic microvascular disease and mild age related atrophic change. No acute cervical spine injury. Moderate spondylosis of the cervical spine with moderate disc disease at the C5-6 and C6-7 levels. Moderate bilateral neural foraminal narrowing at multiple levels due to adjacent bony spurring. Electronically Signed   By: Marin Olp M.D.   On: 01/14/2018 10:13   Ct Chest W Contrast  Result Date: 01/14/2018 CLINICAL DATA:  84 year old fell from bed.  Abdominal trauma. EXAM: CT CHEST, ABDOMEN, AND PELVIS WITH CONTRAST TECHNIQUE: Multidetector CT imaging of the chest, abdomen and pelvis was performed following the standard protocol during bolus administration of intravenous contrast. CONTRAST:  145m ISOVUE-300 IOPAMIDOL (ISOVUE-300) INJECTION 61% COMPARISON:  12/21/2016 FINDINGS: CT CHEST FINDINGS Cardiovascular: Atherosclerotic calcifications in the coronary arteries and throughout the thoracic aorta. No evidence to suggest aortic aneurysm or dissection. Main pulmonary arteries are patent. Heart size is normal without significant pericardial fluid. Mediastinum/Nodes: Small hiatal hernia. No lymph node enlargement in the chest. Lungs/Pleura: Trachea and mainstem bronchi are patent. Negative for pneumothorax. Scarring at the lung apices, right  side greater than left. Scarring and/or atelectasis at both lung bases. No large pleural effusions. 5 mm pleural-based  nodule at the right lung base along the right major fissure on sequence 4, image 112. This was present on the study from 12/21/2016 and likely an incidental finding. Peripheral reticular densities in the right upper lung are suggestive for chronic changes, possibly mild fibrosis in these areas. No significant airspace disease or consolidation. Musculoskeletal: Both shoulders are located. Clavicles are intact. Old compression fracture at T9 with vertebral body cement. Old compression fracture at T11 with previous vertebral augmentation. No evidence for a displaced rib fracture. CT ABDOMEN PELVIS FINDINGS Hepatobiliary: Gallbladder is mildly distended without inflammatory changes. Normal appearance of the liver. No biliary dilatation. The main portal venous system is patent. Pancreas: Pancreas is poorly characterized. However, there appear to be a tubular or cystic structure along the top of the pancreatic body or adjacent to the pancreas body on sequence 2, image 57. This structure measures greater than 2.5 cm. In retrospect, this was present on 12/21/2016 and may have been present on the noncontrast study from 05/26/2010. No acute inflammatory changes involving the pancreas. Spleen: Normal in size without focal abnormality. Adrenals/Urinary Tract: Adrenal glands are unremarkable. Normal appearance of both kidneys without hydronephrosis. No suspicious renal lesions. Urinary bladder is markedly distended extending into the lower abdomen. Stomach/Bowel: Small hiatal hernia. No evidence for bowel obstruction or focal bowel inflammation. Vascular/Lymphatic: Aorta and visceral arteries are heavily calcified without aneurysm. No lymph node enlargement in the abdomen or pelvis. Reproductive: Uterus and adnexal structures are unremarkable. Other: There is soft tissue in the presacral space on sequence 2, image 100. This area measures roughly 2.9 x 1.8 x 3.2 cm. Similar finding was present on the CT from 10/24/2016 and there  may have also been some soft tissue in this area dating back to 05/26/2010. This is likely a benign etiology. There are low-density areas within this soft tissues suggesting there may be a fat or lipoma component. No evidence for adjacent bone destruction. Negative for ascites. Negative for free air. Musculoskeletal: Old compression fractures with vertebral body cement at L1 and L2. Disc space narrowing at L4-L5. No evidence for a new vertebral body fracture. IMPRESSION: No acute abnormalities within the chest, abdomen or pelvis. Urinary bladder distension without hydronephrosis. Indeterminate soft tissue in the presacral space measuring up to 3.2 cm. This has been present for multiple years although there has probably been interval growth. Favor a benign etiology. There is low-density material within this structure and this could represent a benign myelolipoma. **An incidental finding of potential clinical significance has been found. Probable cystic or tubular structure along the superior aspect of the pancreatic body. This is poorly characterized on this examination but suspect this has been stable for at least 1 year if not longer. Consider further characterization of this area with MRI in 6 months.** Old compression fractures and previous vertebral body augmentation procedures. No evidence for an acute vertebral body compression fracture. Chronic changes and scarring in lungs particularly in the right upper lung and apex regions. Electronically Signed   By: Markus Daft M.D.   On: 01/14/2018 14:04   Ct Cervical Spine Wo Contrast  Result Date: 01/14/2018 CLINICAL DATA:  Fall last night landing on right side. EXAM: CT HEAD WITHOUT CONTRAST CT CERVICAL SPINE WITHOUT CONTRAST TECHNIQUE: Multidetector CT imaging of the head and cervical spine was performed following the standard protocol without intravenous contrast. Multiplanar CT image reconstructions of the cervical  spine were also generated. COMPARISON:  None.  FINDINGS: CT HEAD FINDINGS Brain: Ventricles, cisterns and other CSF spaces are within normal as there is mild age related atrophic change. Mild to moderate chronic ischemic microvascular disease is present. There is no mass, mass effect, shift of midline structures or acute hemorrhage. No evidence of acute infarction. Vascular: No hyperdense vessel or unexpected calcification. Skull: Normal. Negative for fracture or focal lesion. Sinuses/Orbits: No acute finding. Other: None. CT CERVICAL SPINE FINDINGS Alignment: 2 mm anterior subluxation of C7 on T1 which is degenerative due to moderate facet arthropathy. Skull base and vertebrae: Moderate spondylosis of the cervical spine. Vertebral body heights are maintained. Atlantoaxial articulation is within normal. There is moderate uncovertebral joint spurring and facet arthropathy. There is neural foraminal narrowing at multiple levels due to adjacent bony spurring. There is no acute fracture. Soft tissues and spinal canal: No prevertebral fluid or swelling. No visible canal hematoma. Disc levels: Moderate disc space narrowing at the C5-6 and C6-7 levels. Upper chest: Negative. Other: None. IMPRESSION: No acute brain injury. Mild to moderate chronic ischemic microvascular disease and mild age related atrophic change. No acute cervical spine injury. Moderate spondylosis of the cervical spine with moderate disc disease at the C5-6 and C6-7 levels. Moderate bilateral neural foraminal narrowing at multiple levels due to adjacent bony spurring. Electronically Signed   By: Marin Olp M.D.   On: 01/14/2018 10:13   Ct Abdomen Pelvis W Contrast  Result Date: 01/14/2018 CLINICAL DATA:  84 year old fell from bed.  Abdominal trauma. EXAM: CT CHEST, ABDOMEN, AND PELVIS WITH CONTRAST TECHNIQUE: Multidetector CT imaging of the chest, abdomen and pelvis was performed following the standard protocol during bolus administration of intravenous contrast. CONTRAST:  168m ISOVUE-300  IOPAMIDOL (ISOVUE-300) INJECTION 61% COMPARISON:  12/21/2016 FINDINGS: CT CHEST FINDINGS Cardiovascular: Atherosclerotic calcifications in the coronary arteries and throughout the thoracic aorta. No evidence to suggest aortic aneurysm or dissection. Main pulmonary arteries are patent. Heart size is normal without significant pericardial fluid. Mediastinum/Nodes: Small hiatal hernia. No lymph node enlargement in the chest. Lungs/Pleura: Trachea and mainstem bronchi are patent. Negative for pneumothorax. Scarring at the lung apices, right side greater than left. Scarring and/or atelectasis at both lung bases. No large pleural effusions. 5 mm pleural-based nodule at the right lung base along the right major fissure on sequence 4, image 112. This was present on the study from 12/21/2016 and likely an incidental finding. Peripheral reticular densities in the right upper lung are suggestive for chronic changes, possibly mild fibrosis in these areas. No significant airspace disease or consolidation. Musculoskeletal: Both shoulders are located. Clavicles are intact. Old compression fracture at T9 with vertebral body cement. Old compression fracture at T11 with previous vertebral augmentation. No evidence for a displaced rib fracture. CT ABDOMEN PELVIS FINDINGS Hepatobiliary: Gallbladder is mildly distended without inflammatory changes. Normal appearance of the liver. No biliary dilatation. The main portal venous system is patent. Pancreas: Pancreas is poorly characterized. However, there appear to be a tubular or cystic structure along the top of the pancreatic body or adjacent to the pancreas body on sequence 2, image 57. This structure measures greater than 2.5 cm. In retrospect, this was present on 12/21/2016 and may have been present on the noncontrast study from 05/26/2010. No acute inflammatory changes involving the pancreas. Spleen: Normal in size without focal abnormality. Adrenals/Urinary Tract: Adrenal glands are  unremarkable. Normal appearance of both kidneys without hydronephrosis. No suspicious renal lesions. Urinary bladder is markedly distended extending into the  lower abdomen. Stomach/Bowel: Small hiatal hernia. No evidence for bowel obstruction or focal bowel inflammation. Vascular/Lymphatic: Aorta and visceral arteries are heavily calcified without aneurysm. No lymph node enlargement in the abdomen or pelvis. Reproductive: Uterus and adnexal structures are unremarkable. Other: There is soft tissue in the presacral space on sequence 2, image 100. This area measures roughly 2.9 x 1.8 x 3.2 cm. Similar finding was present on the CT from 10/24/2016 and there may have also been some soft tissue in this area dating back to 05/26/2010. This is likely a benign etiology. There are low-density areas within this soft tissues suggesting there may be a fat or lipoma component. No evidence for adjacent bone destruction. Negative for ascites. Negative for free air. Musculoskeletal: Old compression fractures with vertebral body cement at L1 and L2. Disc space narrowing at L4-L5. No evidence for a new vertebral body fracture. IMPRESSION: No acute abnormalities within the chest, abdomen or pelvis. Urinary bladder distension without hydronephrosis. Indeterminate soft tissue in the presacral space measuring up to 3.2 cm. This has been present for multiple years although there has probably been interval growth. Favor a benign etiology. There is low-density material within this structure and this could represent a benign myelolipoma. **An incidental finding of potential clinical significance has been found. Probable cystic or tubular structure along the superior aspect of the pancreatic body. This is poorly characterized on this examination but suspect this has been stable for at least 1 year if not longer. Consider further characterization of this area with MRI in 6 months.** Old compression fractures and previous vertebral body  augmentation procedures. No evidence for an acute vertebral body compression fracture. Chronic changes and scarring in lungs particularly in the right upper lung and apex regions. Electronically Signed   By: Markus Daft M.D.   On: 01/14/2018 14:04   Dg Chest Port 1 View  Result Date: 01/14/2018 CLINICAL DATA:  Fall last night onto right side. EXAM: PORTABLE CHEST 1 VIEW COMPARISON:  10/01/2017 FINDINGS: Lungs are adequately inflated and otherwise clear. Cardiomediastinal silhouette is within normal. No acute fracture. Stable thoracic spine compression fractures. IMPRESSION: No acute findings. Electronically Signed   By: Marin Olp M.D.   On: 01/14/2018 10:20   Dg Femur 1v Right  Result Date: 01/14/2018 CLINICAL DATA:  Fall last night with right-sided pain. EXAM: RIGHT FEMUR 1 VIEW COMPARISON:  None. FINDINGS: Mild degenerate change of the right hip. Right total knee arthroplasty intact and normally located. No evidence of acute fracture or dislocation. IMPRESSION: No acute findings. Electronically Signed   By: Marin Olp M.D.   On: 01/14/2018 10:23    Procedures Procedures (including critical care time) CRITICAL CARE Performed by: Charlesetta Shanks   Total critical care time: 60  minutes  Critical care time was exclusive of separately billable procedures and treating other patients.  Critical care was necessary to treat or prevent imminent or life-threatening deterioration.  Critical care was time spent personally by me on the following activities: development of treatment plan with patient and/or surrogate as well as nursing, discussions with consultants, evaluation of patient's response to treatment, examination of patient, obtaining history from patient or surrogate, ordering and performing treatments and interventions, ordering and review of laboratory studies, ordering and review of radiographic studies, pulse oximetry and re-evaluation of patient's condition. Medications Ordered in  ED Medications  0.9 %  sodium chloride infusion (has no administration in time range)  sodium chloride 0.9 % bolus 500 mL (0 mLs Intravenous Stopped 01/14/18 1112)  0.9 %  sodium chloride infusion ( Intravenous Restarted 01/14/18 1113)  HYDROmorphone (DILAUDID) injection 0.5 mg (0.5 mg Intravenous Given 01/14/18 1013)  morphine 4 MG/ML injection 4 mg (4 mg Intravenous Given 01/14/18 1200)  iopamidol (ISOVUE-300) 61 % injection 100 mL (100 mLs Intravenous Contrast Given 01/14/18 1250)  oxyCODONE-acetaminophen (PERCOCET/ROXICET) 5-325 MG per tablet 1 tablet (1 tablet Oral Given 01/14/18 1440)     Initial Impression / Assessment and Plan / ED Course  I have reviewed the triage vital signs and the nursing notes.  Pertinent labs & imaging results that were available during my care of the patient were reviewed by me and considered in my medical decision making (see chart for details).     Final Clinical Impressions(s) / ED Diagnoses   Final diagnoses:  Traumatic rhabdomyolysis, initial encounter ([place])  Intractable back pain  Fall, initial encounter   Patient had a mechanical fall at her independent living facility last night.  She did have to spend the night on the floor and was  unable to find assistance until the morning.  She does have pre-existing compression fractures and low back pain.  She reports however her pain in her lower back and right hip were much worse.  She reports she was unable to get back up to bear weight.  Reports she did spend most the night lying on her right side.  CT scanning does not identify any acute fractures.  No intracranial, intrathoracic or intra-abdominal traumatic injury.  Patient does have mild rhabdomyolysis.  She has got a area of erythema to the lateral lower leg consistent with pressure injury.  No diffuse or large swelling or firmness of the lower leg.  No signs of compartment syndrome at this time.  She has been treated for pain with Dilaudid and morphine.   She does continue to have significant pain in her lower back and hip with repositioning.  At this time she has intractable pain and does not feel that she can weight-bear for transfers to bedside commode.  Plan will be for continued hydration and observation for rhabdomyolysis and pain control with PT OT for contusion and strain of the low back and hip from acute fall.  Pain appears to be acute on chronic. ED Discharge Orders    None       Charlesetta Shanks, MD 01/14/18 1540

## 2018-01-14 NOTE — ED Triage Notes (Signed)
Per EMS: Pt fell last night at 9pm, EMS arrived at 0800.  Pt states she fell bc she was trying to hold her water bottle while using the walker.  Pt c/o of thoracic back pain, tender on palpation.  Significant bruise on R elbow, pelvis is stable but tender.  Pt was found laying on her right side.  EMS gave 200 mcg of fentanyl.

## 2018-01-14 NOTE — ED Notes (Signed)
Bed: WA17 Expected date: 01/14/18 Expected time: 8:52 AM Means of arrival: Ambulance Comments: 84 yo fall last night, found this morning. Back pain

## 2018-01-14 NOTE — ED Notes (Signed)
ED TO INPATIENT HANDOFF REPORT  Name/Age/Gender Sheralyn Pinegar Hellard 84 y.o. female  Code Status Code Status History    Date Active Date Inactive Code Status Order ID Comments User Context   12/22/2016 0339 12/26/2016 1837 Full Code [id number]  Ivor Costa, MD Inpatient   10/21/2016 0256 10/22/2016 1706 Full Code [id number]  Edwin Dada, MD Inpatient   04/24/2012 1514 04/27/2012 1705 Full Code [id number]  Conley Canal, RN Inpatient      Home/SNF/Other Nursing Home  Chief Complaint fall, back pain  Level of Care/Admitting Diagnosis ED Disposition    ED Disposition Condition [place]: [facility] [[id number]]  Level of Care: Med-Surg [16]  Diagnosis: Fall [290176]  Admitting Physician: Dessa Phi [[id number]]  Attending Physician: Dessa Phi [phone]  Estimated length of stay: past midnight tomorrow  Certification:: I certify this patient will need inpatient services for at least 2 midnights  PT Class (Do Not Modify): Inpatient [101]  PT Acc Code (Do Not Modify): Private [1]       Medical History Past Medical History:  Diagnosis Date  . Anemia   . Anxiety   . Chronotropic incompetence 12/2012    potentially medication related; noted on CPET   . DDD (degenerative disc disease)   . Eczema   . GERD (gastroesophageal reflux disease)   . H/O hiatal hernia   . Hx: UTI (urinary tract infection)   . Hyperlipidemia   . Hypertension   . Hypothyroidism   . Osteopenia   . Septicemia ([place]) 2002   following UTI  . Vertigo, benign positional     Allergies No Known Allergies  IV Location/Drains/Wounds Patient Lines/Drains/Airways Status   Active Line/Drains/Airways    Name:   Placement date:   Placement time:   Site:   Days:   Peripheral IV 01/14/18 Left Hand   01/14/18    -    Hand   less than 1          Labs/Imaging Results for orders placed or performed during the hospital encounter of 01/14/18 (from the past 48  hour(s))  Comprehensive metabolic panel     Status: Abnormal   Collection Time: 01/14/18  9:28 AM  Result Value Ref Range   Sodium 135 135 - 145 mmol/L   Potassium 4.0 3.5 - 5.1 mmol/L   Chloride 100 (L) 101 - 111 mmol/L   CO2 26 22 - 32 mmol/L   Glucose, Bld 135 (H) 65 - 99 mg/dL   BUN 25 (H) 6 - 20 mg/dL   Creatinine, Ser 0.62 0.44 - 1.00 mg/dL   Calcium 9.6 8.9 - 10.3 mg/dL   Total Protein 7.9 6.5 - 8.1 g/dL   Albumin 4.3 3.5 - 5.0 g/dL   AST 60 (H) 15 - 41 U/L   ALT 26 14 - 54 U/L   Alkaline Phosphatase 85 38 - 126 U/L   Total Bilirubin 0.8 0.3 - 1.2 mg/dL   GFR calc non Af Amer >60 >60 mL/min   GFR calc Af Amer >60 >60 mL/min    Comment: (NOTE) The eGFR has been calculated using the CKD EPI equation. This calculation has not been validated in all clinical situations. eGFR's persistently <60 mL/min signify possible Chronic Kidney Disease.    Anion gap 9 5 - 15    Comment: Performed at [facility], [place] [address] [place], [place] [address]  CBC with Differential     Status: Abnormal   Collection  Time: 01/14/18  9:28 AM  Result Value Ref Range   WBC 10.2 4.0 - 10.5 K/uL   RBC 4.24 3.87 - 5.11 MIL/uL   Hemoglobin 12.3 12.0 - 15.0 g/dL   HCT 37.3 36.0 - 46.0 %   MCV 88.0 78.0 - 100.0 fL   MCH 29.0 26.0 - 34.0 pg   MCHC 33.0 30.0 - 36.0 g/dL   RDW 16.5 (H) 11.5 - 15.5 %   Platelets 98 (L) 150 - 400 K/uL    Comment: RESULT REPEATED AND VERIFIED SPECIMEN CHECKED FOR CLOTS CONSISTENT WITH PREVIOUS RESULT    Neutrophils Relative % 77 %   Lymphocytes Relative 12 %   Monocytes Relative 11 %   Eosinophils Relative 0 %   Basophils Relative 0 %   Neutro Abs 7.9 (H) 1.7 - 7.7 K/uL   Lymphs Abs 1.2 0.7 - 4.0 K/uL   Monocytes Absolute 1.1 (H) 0.1 - 1.0 K/uL   Eosinophils Absolute 0.0 0.0 - 0.7 K/uL   Basophils Absolute 0.0 0.0 - 0.1 K/uL   Smear Review MORPHOLOGY UNREMARKABLE     Comment: Performed at [facility], [place] [address], [place], [place] [address]  CK     Status: Abnormal   Collection Time: 01/14/18  9:28 AM  Result Value Ref Range   Total CK 1,707 (H) 38 - 234 U/L    Comment: Performed at [facility], [place] [address], [place], [place] [address]  I-Stat CG4 Lactic Acid, ED     Status: None   Collection Time: 01/14/18 10:28 AM  Result Value Ref Range   Lactic Acid, Venous 1.34 0.5 - 1.9 mmol/L   Dg Pelvis 1-2 Views  Result Date: 01/14/2018 CLINICAL DATA:  Fall last night onto right side. EXAM: PELVIS - 1-2 VIEW COMPARISON:  None. FINDINGS: There is no evidence of pelvic fracture or diastasis. No pelvic bone lesions are seen. Mild degenerate change of the spine and hips. IMPRESSION: No acute fracture. Electronically Signed   By: Marin Olp M.D.   On: 01/14/2018 10:19   Dg Tibia/fibula Right  Result Date: 01/14/2018 CLINICAL DATA:  Fall last night onto right side with right lower leg pain. EXAM: RIGHT TIBIA AND FIBULA - 2 VIEW COMPARISON:  None. FINDINGS: Right total knee arthroplasty intact. No acute fracture or dislocation. IMPRESSION: No acute findings. Electronically Signed   By: Marin Olp M.D.   On: 01/14/2018 10:22   Ct Head Wo Contrast  Result Date: 01/14/2018 CLINICAL DATA:  Fall last night landing on right side. EXAM: CT HEAD WITHOUT CONTRAST CT CERVICAL SPINE WITHOUT CONTRAST TECHNIQUE: Multidetector CT imaging of the head and cervical spine was performed following the standard protocol without intravenous contrast. Multiplanar CT image reconstructions of the cervical spine were also generated. COMPARISON:  None. FINDINGS: CT HEAD FINDINGS Brain: Ventricles, cisterns and other CSF spaces are within normal as there is mild age related atrophic change. Mild to moderate chronic ischemic microvascular disease is present. There is no mass, mass effect, shift of midline structures or acute hemorrhage. No evidence of acute infarction. Vascular: No hyperdense vessel or unexpected  calcification. Skull: Normal. Negative for fracture or focal lesion. Sinuses/Orbits: No acute finding. Other: None. CT CERVICAL SPINE FINDINGS Alignment: 2 mm anterior subluxation of C7 on T1 which is degenerative due to moderate facet arthropathy. Skull base and vertebrae: Moderate spondylosis of the cervical spine. Vertebral body heights are maintained. Atlantoaxial articulation is within normal. There is moderate uncovertebral joint spurring and  facet arthropathy. There is neural foraminal narrowing at multiple levels due to adjacent bony spurring. There is no acute fracture. Soft tissues and spinal canal: No prevertebral fluid or swelling. No visible canal hematoma. Disc levels: Moderate disc space narrowing at the C5-6 and C6-7 levels. Upper chest: Negative. Other: None. IMPRESSION: No acute brain injury. Mild to moderate chronic ischemic microvascular disease and mild age related atrophic change. No acute cervical spine injury. Moderate spondylosis of the cervical spine with moderate disc disease at the C5-6 and C6-7 levels. Moderate bilateral neural foraminal narrowing at multiple levels due to adjacent bony spurring. Electronically Signed   By: Marin Olp M.D.   On: 01/14/2018 10:13   Ct Chest W Contrast  Result Date: 01/14/2018 CLINICAL DATA:  84 year old fell from bed.  Abdominal trauma. EXAM: CT CHEST, ABDOMEN, AND PELVIS WITH CONTRAST TECHNIQUE: Multidetector CT imaging of the chest, abdomen and pelvis was performed following the standard protocol during bolus administration of intravenous contrast. CONTRAST:  143m ISOVUE-300 IOPAMIDOL (ISOVUE-300) INJECTION 61% COMPARISON:  12/21/2016 FINDINGS: CT CHEST FINDINGS Cardiovascular: Atherosclerotic calcifications in the coronary arteries and throughout the thoracic aorta. No evidence to suggest aortic aneurysm or dissection. Main pulmonary arteries are patent. Heart size is normal without significant pericardial fluid. Mediastinum/Nodes: Small hiatal  hernia. No lymph node enlargement in the chest. Lungs/Pleura: Trachea and mainstem bronchi are patent. Negative for pneumothorax. Scarring at the lung apices, right side greater than left. Scarring and/or atelectasis at both lung bases. No large pleural effusions. 5 mm pleural-based nodule at the right lung base along the right major fissure on sequence 4, image 112. This was present on the study from 12/21/2016 and likely an incidental finding. Peripheral reticular densities in the right upper lung are suggestive for chronic changes, possibly mild fibrosis in these areas. No significant airspace disease or consolidation. Musculoskeletal: Both shoulders are located. Clavicles are intact. Old compression fracture at T9 with vertebral body cement. Old compression fracture at T11 with previous vertebral augmentation. No evidence for a displaced rib fracture. CT ABDOMEN PELVIS FINDINGS Hepatobiliary: Gallbladder is mildly distended without inflammatory changes. Normal appearance of the liver. No biliary dilatation. The main portal venous system is patent. Pancreas: Pancreas is poorly characterized. However, there appear to be a tubular or cystic structure along the top of the pancreatic body or adjacent to the pancreas body on sequence 2, image 57. This structure measures greater than 2.5 cm. In retrospect, this was present on 12/21/2016 and may have been present on the noncontrast study from 05/26/2010. No acute inflammatory changes involving the pancreas. Spleen: Normal in size without focal abnormality. Adrenals/Urinary Tract: Adrenal glands are unremarkable. Normal appearance of both kidneys without hydronephrosis. No suspicious renal lesions. Urinary bladder is markedly distended extending into the lower abdomen. Stomach/Bowel: Small hiatal hernia. No evidence for bowel obstruction or focal bowel inflammation. Vascular/Lymphatic: Aorta and visceral arteries are heavily calcified without aneurysm. No lymph node  enlargement in the abdomen or pelvis. Reproductive: Uterus and adnexal structures are unremarkable. Other: There is soft tissue in the presacral space on sequence 2, image 100. This area measures roughly 2.9 x 1.8 x 3.2 cm. Similar finding was present on the CT from 10/24/2016 and there may have also been some soft tissue in this area dating back to 05/26/2010. This is likely a benign etiology. There are low-density areas within this soft tissues suggesting there may be a fat or lipoma component. No evidence for adjacent bone destruction. Negative for ascites. Negative for free air. Musculoskeletal:  Old compression fractures with vertebral body cement at L1 and L2. Disc space narrowing at L4-L5. No evidence for a new vertebral body fracture. IMPRESSION: No acute abnormalities within the chest, abdomen or pelvis. Urinary bladder distension without hydronephrosis. Indeterminate soft tissue in the presacral space measuring up to 3.2 cm. This has been present for multiple years although there has probably been interval growth. Favor a benign etiology. There is low-density material within this structure and this could represent a benign myelolipoma. **An incidental finding of potential clinical significance has been found. Probable cystic or tubular structure along the superior aspect of the pancreatic body. This is poorly characterized on this examination but suspect this has been stable for at least 1 year if not longer. Consider further characterization of this area with MRI in 6 months.** Old compression fractures and previous vertebral body augmentation procedures. No evidence for an acute vertebral body compression fracture. Chronic changes and scarring in lungs particularly in the right upper lung and apex regions. Electronically Signed   By: Markus Daft M.D.   On: 01/14/2018 14:04   Ct Cervical Spine Wo Contrast  Result Date: 01/14/2018 CLINICAL DATA:  Fall last night landing on right side. EXAM: CT HEAD  WITHOUT CONTRAST CT CERVICAL SPINE WITHOUT CONTRAST TECHNIQUE: Multidetector CT imaging of the head and cervical spine was performed following the standard protocol without intravenous contrast. Multiplanar CT image reconstructions of the cervical spine were also generated. COMPARISON:  None. FINDINGS: CT HEAD FINDINGS Brain: Ventricles, cisterns and other CSF spaces are within normal as there is mild age related atrophic change. Mild to moderate chronic ischemic microvascular disease is present. There is no mass, mass effect, shift of midline structures or acute hemorrhage. No evidence of acute infarction. Vascular: No hyperdense vessel or unexpected calcification. Skull: Normal. Negative for fracture or focal lesion. Sinuses/Orbits: No acute finding. Other: None. CT CERVICAL SPINE FINDINGS Alignment: 2 mm anterior subluxation of C7 on T1 which is degenerative due to moderate facet arthropathy. Skull base and vertebrae: Moderate spondylosis of the cervical spine. Vertebral body heights are maintained. Atlantoaxial articulation is within normal. There is moderate uncovertebral joint spurring and facet arthropathy. There is neural foraminal narrowing at multiple levels due to adjacent bony spurring. There is no acute fracture. Soft tissues and spinal canal: No prevertebral fluid or swelling. No visible canal hematoma. Disc levels: Moderate disc space narrowing at the C5-6 and C6-7 levels. Upper chest: Negative. Other: None. IMPRESSION: No acute brain injury. Mild to moderate chronic ischemic microvascular disease and mild age related atrophic change. No acute cervical spine injury. Moderate spondylosis of the cervical spine with moderate disc disease at the C5-6 and C6-7 levels. Moderate bilateral neural foraminal narrowing at multiple levels due to adjacent bony spurring. Electronically Signed   By: Marin Olp M.D.   On: 01/14/2018 10:13   Ct Abdomen Pelvis W Contrast  Result Date: 01/14/2018 CLINICAL DATA:   84 year old fell from bed.  Abdominal trauma. EXAM: CT CHEST, ABDOMEN, AND PELVIS WITH CONTRAST TECHNIQUE: Multidetector CT imaging of the chest, abdomen and pelvis was performed following the standard protocol during bolus administration of intravenous contrast. CONTRAST:  120m ISOVUE-300 IOPAMIDOL (ISOVUE-300) INJECTION 61% COMPARISON:  12/21/2016 FINDINGS: CT CHEST FINDINGS Cardiovascular: Atherosclerotic calcifications in the coronary arteries and throughout the thoracic aorta. No evidence to suggest aortic aneurysm or dissection. Main pulmonary arteries are patent. Heart size is normal without significant pericardial fluid. Mediastinum/Nodes: Small hiatal hernia. No lymph node enlargement in the chest. Lungs/Pleura: Trachea and mainstem  bronchi are patent. Negative for pneumothorax. Scarring at the lung apices, right side greater than left. Scarring and/or atelectasis at both lung bases. No large pleural effusions. 5 mm pleural-based nodule at the right lung base along the right major fissure on sequence 4, image 112. This was present on the study from 12/21/2016 and likely an incidental finding. Peripheral reticular densities in the right upper lung are suggestive for chronic changes, possibly mild fibrosis in these areas. No significant airspace disease or consolidation. Musculoskeletal: Both shoulders are located. Clavicles are intact. Old compression fracture at T9 with vertebral body cement. Old compression fracture at T11 with previous vertebral augmentation. No evidence for a displaced rib fracture. CT ABDOMEN PELVIS FINDINGS Hepatobiliary: Gallbladder is mildly distended without inflammatory changes. Normal appearance of the liver. No biliary dilatation. The main portal venous system is patent. Pancreas: Pancreas is poorly characterized. However, there appear to be a tubular or cystic structure along the top of the pancreatic body or adjacent to the pancreas body on sequence 2, image 57. This structure  measures greater than 2.5 cm. In retrospect, this was present on 12/21/2016 and may have been present on the noncontrast study from 05/26/2010. No acute inflammatory changes involving the pancreas. Spleen: Normal in size without focal abnormality. Adrenals/Urinary Tract: Adrenal glands are unremarkable. Normal appearance of both kidneys without hydronephrosis. No suspicious renal lesions. Urinary bladder is markedly distended extending into the lower abdomen. Stomach/Bowel: Small hiatal hernia. No evidence for bowel obstruction or focal bowel inflammation. Vascular/Lymphatic: Aorta and visceral arteries are heavily calcified without aneurysm. No lymph node enlargement in the abdomen or pelvis. Reproductive: Uterus and adnexal structures are unremarkable. Other: There is soft tissue in the presacral space on sequence 2, image 100. This area measures roughly 2.9 x 1.8 x 3.2 cm. Similar finding was present on the CT from 10/24/2016 and there may have also been some soft tissue in this area dating back to 05/26/2010. This is likely a benign etiology. There are low-density areas within this soft tissues suggesting there may be a fat or lipoma component. No evidence for adjacent bone destruction. Negative for ascites. Negative for free air. Musculoskeletal: Old compression fractures with vertebral body cement at L1 and L2. Disc space narrowing at L4-L5. No evidence for a new vertebral body fracture. IMPRESSION: No acute abnormalities within the chest, abdomen or pelvis. Urinary bladder distension without hydronephrosis. Indeterminate soft tissue in the presacral space measuring up to 3.2 cm. This has been present for multiple years although there has probably been interval growth. Favor a benign etiology. There is low-density material within this structure and this could represent a benign myelolipoma. **An incidental finding of potential clinical significance has been found. Probable cystic or tubular structure along the  superior aspect of the pancreatic body. This is poorly characterized on this examination but suspect this has been stable for at least 1 year if not longer. Consider further characterization of this area with MRI in 6 months.** Old compression fractures and previous vertebral body augmentation procedures. No evidence for an acute vertebral body compression fracture. Chronic changes and scarring in lungs particularly in the right upper lung and apex regions. Electronically Signed   By: Markus Daft M.D.   On: 01/14/2018 14:04   Dg Chest Port 1 View  Result Date: 01/14/2018 CLINICAL DATA:  Fall last night onto right side. EXAM: PORTABLE CHEST 1 VIEW COMPARISON:  10/01/2017 FINDINGS: Lungs are adequately inflated and otherwise clear. Cardiomediastinal silhouette is within normal. No acute fracture. Stable  thoracic spine compression fractures. IMPRESSION: No acute findings. Electronically Signed   By: Marin Olp M.D.   On: 01/14/2018 10:20   Dg Femur 1v Right  Result Date: 01/14/2018 CLINICAL DATA:  Fall last night with right-sided pain. EXAM: RIGHT FEMUR 1 VIEW COMPARISON:  None. FINDINGS: Mild degenerate change of the right hip. Right total knee arthroplasty intact and normally located. No evidence of acute fracture or dislocation. IMPRESSION: No acute findings. Electronically Signed   By: Marin Olp M.D.   On: 01/14/2018 10:23    Pending Labs Unresulted Labs (From admission, onward)   Start     Ordered   01/14/18 0929  Urinalysis, Routine w reflex microscopic  STAT,   STAT     01/14/18 0930   Signed and Held  CBC  Tomorrow morning,   R     Signed and Held   Signed and Held  Basic metabolic panel  Tomorrow morning,   R     Signed and Held      Vitals/Pain Today's Vitals   01/14/18 1425 01/14/18 1500 01/14/18 1530 01/14/18 1600  BP: (!) 193/76 (!) 154/65 (!) 157/58 (!) 169/64  Pulse: 100   67  Resp: 20 (!) 24 (!) 21 14  Temp:      TempSrc:      SpO2: 100%   99%  Weight:      Height:       PainSc:        Isolation Precautions No active isolations  Medications Medications  sodium chloride 0.9 % bolus 500 mL (0 mLs Intravenous Stopped 01/14/18 1112)  0.9 %  sodium chloride infusion ( Intravenous Stopped 01/14/18 1536)  HYDROmorphone (DILAUDID) injection 0.5 mg (0.5 mg Intravenous Given 01/14/18 1013)  morphine 4 MG/ML injection 4 mg (4 mg Intravenous Given 01/14/18 1200)  iopamidol (ISOVUE-300) 61 % injection 100 mL (100 mLs Intravenous Contrast Given 01/14/18 1250)  oxyCODONE-acetaminophen (PERCOCET/ROXICET) 5-325 MG per tablet 1 tablet (1 tablet Oral Given 01/14/18 1440)  0.9 %  sodium chloride infusion ( Intravenous New Bag/Given 01/14/18 1542)    Mobility walks with device

## 2018-01-15 DIAGNOSIS — I10 Essential (primary) hypertension: Secondary | ICD-10-CM | POA: Diagnosis not present

## 2018-01-15 DIAGNOSIS — W19XXXA Unspecified fall, initial encounter: Secondary | ICD-10-CM | POA: Diagnosis not present

## 2018-01-15 DIAGNOSIS — T796XXA Traumatic ischemia of muscle, initial encounter: Secondary | ICD-10-CM | POA: Diagnosis not present

## 2018-01-15 LAB — BASIC METABOLIC PANEL
ANION GAP: 6 (ref 5–15)
BUN: 19 mg/dL (ref 6–20)
CALCIUM: 8.9 mg/dL (ref 8.9–10.3)
CHLORIDE: 106 mmol/L (ref 101–111)
CO2: 26 mmol/L (ref 22–32)
Creatinine, Ser: 0.63 mg/dL (ref 0.44–1.00)
GFR calc Af Amer: >60 mL/min (ref >60)
GFR calc non Af Amer: >60 mL/min (ref >60)
Glucose, Bld: 104 mg/dL — ABNORMAL HIGH (ref 65–99)
Potassium: 3.8 mmol/L (ref 3.5–5.1)
Sodium: 138 mmol/L (ref 135–145)

## 2018-01-15 LAB — CBC
HEMATOCRIT: 33.7 % — AB (ref 36.0–46.0)
HEMOGLOBIN: 10.9 g/dL — AB (ref 12.0–15.0)
MCH: 29 pg (ref 26.0–34.0)
MCHC: 32.3 g/dL (ref 30.0–36.0)
MCV: 89.6 fL (ref 78.0–100.0)
Platelets: 80 10*3/uL — ABNORMAL LOW (ref 150–400)
RBC: 3.76 MIL/uL — AB (ref 3.87–5.11)
RDW: 17 % — ABNORMAL HIGH (ref 11.5–15.5)
WBC: 5.7 10*3/uL (ref 4.0–10.5)

## 2018-01-15 MED ORDER — DOCUSATE SODIUM 100 MG PO CAPS
100.0000 mg | ORAL_CAPSULE | Freq: Every day | ORAL | Status: DC
  Administered 2018-01-15 – 2018-01-16 (×2): 100 mg via ORAL
  Filled 2018-01-15 (×2): qty 1

## 2018-01-15 NOTE — Progress Notes (Signed)
PT Note  Patient Details Name: [name] [name] MRN: [id number] DOB: 08/03/1930        Eval completed this morning, full write up to follow. Pt from independent living at [facility], however may need ST -SNF (or 24 hour assistance /supervision initially if that is what family chooses) .    [name] [name], [name] [name] 01/15/2018, 1:33 PM  [name] [name], PT Pager: [phone] 01/15/2018

## 2018-01-15 NOTE — Evaluation (Signed)
Occupational Therapy Evaluation Patient Details Name: [name] [name] MRN: [id number] DOB: [birth year] Today's Date: 01/15/2018    History of Present Illness this 84 y.o. female admitted after sustaining a fall in her apt.   She was on the floor from 730a - 9p.   She was found to have elevated CKD with mild rhabdomyolosis.   Work up revealed no fractures.  PMH includes:  chronic low back pain with h/o kyphoplasty, HTN, h/o BPPV, s/p TKA   Clinical Impression   Pt admitted with above. She demonstrates the below listed deficits and will benefit from continued OT to maximize safety and independence with BADLs.  Pt presents to OT with generalized weakness, decreased activity tolerance, impaired balance, impaired cognition.  Currently, she requires min - total A for ADLs, and mod A for functional transfers.  She lives at [facility] in independent Living apt, and was mod I - supervision for ADLs, but had caregiver assist for several hours in am to assist with IADLs and supervision with shower.   Family is very supportive.  In the past, pt did not care for SNF level care, and family instead provided 24 hour assistance to her.  They are uncertain at this time, if pt will choose to have 24 hour assist, or go to SNF at [facility], however, anticipate she will choose home with assistance.  Will follow acutely.       Follow Up Recommendations  SNF;Supervision/Assistance - 24 hour (likely will choose home with 24 hour assist- family will arrange, if she chooses this option)   Equipment Recommendations  None recommended by OT    Recommendations for Other Services       Precautions / Restrictions Precautions Precautions: Fall      Mobility Bed Mobility Overal bed mobility: Needs Assistance Bed Mobility: Rolling;Sidelying to Sit Rolling: Mod assist Sidelying to sit: Mod assist       General bed mobility comments: assist to roll to Lt and assist to lift trunk and scoot to EOB    Transfers Overall transfer level: Needs assistance Equipment used: Rolling walker (2 wheeled) Transfers: Sit to/from [employer] Sit to Stand: Mod assist Stand pivot transfers: Min assist;+2 safety/equipment       General transfer comment: Pt requires assist to lift into standing, and assist to steady.  She initially required +2 assist for safety, but progressed to +1 assist     Balance Overall balance assessment: Needs assistance Sitting-balance support: Feet supported Sitting balance-Leahy Scale: Fair     Standing balance support: Bilateral upper extremity supported Standing balance-Leahy Scale: Poor Standing balance comment: reliant on UE support                            ADL either performed or assessed with clinical judgement   ADL Overall ADL's : Needs assistance/impaired Eating/Feeding: Independent   Grooming: Wash/dry hands;Wash/dry face;Oral care;Brushing hair;Minimal assistance;Standing   Upper Body Bathing: Minimal assistance;Sitting   Lower Body Bathing: Maximal assistance;Sit to/from stand   Upper Body Dressing : Minimal assistance;Sitting   Lower Body Dressing: Total assistance;Sit to/from stand   Toilet Transfer: Moderate assistance;Ambulation;Comfort height toilet;Grab bars;RW Statistician Details (indicate cue type and reason): assist to rise and lower to commode, and assist to safely maneuver RW  Toileting- Clothing Manipulation and Hygiene: Total assistance;Sit to/from stand       Functional mobility during ADLs: Moderate assistance;Rolling walker       Vision  Perception     Praxis      Pertinent Vitals/Pain Pain Assessment: Faces Faces Pain Scale: Hurts little more Pain Location: generalized  Pain Descriptors / Indicators: Grimacing;Guarding Pain Intervention(s): Monitored during session     Hand Dominance Right   Extremity/Trunk Assessment Upper Extremity Assessment Upper Extremity  Assessment: Generalized weakness   Lower Extremity Assessment Lower Extremity Assessment: Defer to PT evaluation   Cervical / Trunk Assessment Cervical / Trunk Assessment: Kyphotic   Communication Communication Communication: No difficulties   Cognition Arousal/Alertness: Awake/alert Behavior During Therapy: WFL for tasks assessed/performed Overall Cognitive Status: History of cognitive impairments - at baseline                                 General Comments: Pt with obvious memory deficits, as daughter frequently had to correct info re: PLOF.  This appears to be her baseline    General Comments       Exercises     Shoulder Instructions      Home Living Family/patient expects to be discharged to:: [facility] Living Arrangements: Alone Available Help at Discharge: Family;Personal care attendant;Available 24 hours/day Type of Home: Independent living facility Home Access: Elevator;Level entry     Home Layout: One level     Bathroom Shower/Tub: Producer, television/film/videoWalk-in shower   Bathroom Toilet: Handicapped height     Home Equipment: Cane - single point;Grab bars - toilet;Grab bars - tub/shower;Hand held shower head;Shower seat;Bedside commode;Walker - 4 wheels   Additional Comments: Pt has PC 2-4 hours 5days/week and family assists on weekends       Prior Functioning/Environment Level of Independence: Needs assistance  Gait / Transfers Assistance Needed: ambulates with RW  ADL's / Homemaking Assistance Needed: Pt is able to perform ADLs with supervision.  Assist for IADLs    Comments: Pt sleep in recliner         OT Problem List: Decreased strength;Decreased activity tolerance;Decreased safety awareness;Decreased knowledge of use of DME or AE;Pain;Decreased cognition      OT Treatment/Interventions: Self-care/ADL training;DME and/or AE instruction;Therapeutic activities;Cognitive remediation/compensation;Balance  training;Patient/family education    OT Goals(Current goals can be found in the care plan section) Acute Rehab OT Goals Patient Stated Goal: to go home and regain independence  OT Goal Formulation: With patient/family Time For Goal Achievement: 01/29/18 Potential to Achieve Goals: Good ADL Goals Pt Will Perform Grooming: with min guard assist;standing Pt Will Perform Upper Body Bathing: with set-up;sitting Pt Will Perform Lower Body Bathing: with min assist;sit to/from stand;with adaptive equipment Pt Will Perform Upper Body Dressing: with supervision;with set-up;sitting Pt Will Perform Lower Body Dressing: with min assist;sit to/from stand;with adaptive equipment Pt Will Transfer to Toilet: with min guard assist;ambulating;bedside commode;grab bars;regular height toilet Pt Will Perform Toileting - Clothing Manipulation and hygiene: with min guard assist;sit to/from stand  OT Frequency: Min 2X/week   Barriers to D/C:            Co-evaluation PT/OT/SLP Co-Evaluation/Treatment: Yes     OT goals addressed during session: ADL's and self-care      AM-PAC PT "6 Clicks" Daily Activity     Outcome Measure Help from another person eating meals?: None Help from another person taking care of personal grooming?: A Little Help from another person toileting, which includes using toliet, bedpan, or urinal?: A Lot Help from another person bathing (including washing, rinsing, drying)?: A Lot Help from another person to  put on and taking off regular upper body clothing?: A Little Help from another person to put on and taking off regular lower body clothing?: Total 6 Click Score: 15   End of Session Equipment Utilized During Treatment: Gait belt;Rolling walker Nurse Communication: Mobility status  Activity Tolerance: Patient tolerated treatment well Patient left: in chair;with call bell/phone within reach;with bed alarm set;with chair alarm set;with family/visitor present  OT Visit  Diagnosis: Unsteadiness on feet (R26.81);Pain Pain - part of body: (generalized )                Time: 1610-9604 OT Time Calculation (min): 41 min Charges:  OT General Charges $OT Visit: 1 Visit OT Evaluation $OT Eval Moderate Complexity: 1 Mod G-Codes:     [employer], OTR/L [phone]   Jeani Hawking M 01/15/2018, 12:57 PM

## 2018-01-15 NOTE — Progress Notes (Signed)
PROGRESS NOTE    [name] [name]  ZOX:[id number] DOB: 1930-11-11 DOA: 01/14/2018 PCP: Oneal Grout, MD  Brief Narrative: 84 y.o. female with medical history significant of hypertension, depression, anxiety, hypothyroidism, chronic back pain who fell last night at the independent living facility.  She states that she was walking with her walker, grabbed a large bottle of water, lost her footing and fell.  She fell around 9 PM and was on the ground until around 7:30 AM.  EMS was called.  She admits to low back pain.  Prior to this, she had been doing well.  No recent illnesses, no fevers, chills, chest pain, abdominal pain, cough, shortness of breath, nausea vomiting or diarrhea.  ED Course: Labs significant for CK 1707.  CT head and cervical spine were negative for acute injuries.  Chest x-ray negative.  Right pelvic, femur, tibia-fibula x-rays without acute fracture.  CT chest abdomen pelvis without acute abnormalities.  Due to continued weakness and pain, patient is referred for further management at [facility].     Assessment & Plan:   Principal Problem:   Fall Active Problems:   Essential hypertension   GERD (gastroesophageal reflux disease)   Depression with anxiety   Chronic bilateral low back pain without sciatica  1] status post fall and deconditioning-no evidence of acute fracture.  Patient lives at independent living facility [facility]'s home.  Will obtain PT OT evaluation patient most likely will need SNF placement.  2] mild rhabdo status post fall being on the floor close to 10 hours.  Continue slow IV hydration.  DC Lasix.  3] hypertension Norvasc and Cozaar.  4] hypothyroidism continue Synthroid.  5] incidental pancreatic cyst follow-up with MRI as an outpatient in 6 months.  6] depression and anxiety continue Xanax and Wellbutrin.   DVT prophylaxis: Lovenox  Code Status: DNR Family Communication: No family available Disposition Plan TBD  Consultants  none  Procedures: None Antimicrobials: None  Subjective: Resting in bed in no acute distress   Objective: Vitals:   01/14/18 1803 01/14/18 2051 01/15/18 0418 01/15/18 0910  BP: (!) 157/54 (!) 148/59 (!) 180/60 (!) 175/74  Pulse: 66 69 77   Resp: 14 17 15    Temp: 97.8 F (36.6 C) 98.2 F (36.8 C) 98.2 F (36.8 C)   TempSrc: Oral Oral Oral   SpO2: 100% 99% 97%   Weight:      Height:        Intake/Output Summary (Last 24 hours) at 01/15/2018 0929 Last data filed at 01/15/2018 0600 Gross per 24 hour  Intake 3135.83 ml  Output 1821 ml  Net 1314.83 ml   Filed Weights   01/14/18 0910  Weight: 70.3 kg (155 lb)    Examination:  General exam: Appears calm and comfortable  Respiratory system: Clear to auscultation. Respiratory effort normal. Cardiovascular system: S1 & S2 heard, RRR. No JVD, murmurs, rubs, gallops or clicks. No pedal edema. Gastrointestinal system: Abdomen is nondistended, soft and nontender. No organomegaly or masses felt. Normal bowel sounds heard. Central nervous system: Alert and oriented. No focal neurological deficits. Extremities: Symmetric 5 x 5 power. Skin: No rashes, lesions or ulcers     Data Reviewed: I have personally reviewed following labs and imaging studies  CBC: Recent Labs  Lab 01/14/18 0928 01/15/18 0524  WBC 10.2 5.7  NEUTROABS 7.9*  --   HGB 12.3 10.9*  HCT 37.3 33.7*  MCV 88.0 89.6  PLT 98* 80*   Basic Metabolic Panel: Recent Labs  Lab  01/14/18 0928 01/15/18 0524  NA 135 138  K 4.0 3.8  CL 100* 106  CO2 26 26  GLUCOSE 135* 104*  BUN 25* 19  CREATININE 0.62 0.63  CALCIUM 9.6 8.9   GFR: Estimated Creatinine Clearance: 49.4 mL/min (by C-G formula based on SCr of 0.63 mg/dL). Liver Function Tests: Recent Labs  Lab 01/14/18 0928  AST 60*  ALT 26  ALKPHOS 85  BILITOT 0.8  PROT 7.9  ALBUMIN 4.3   No results for input(s): LIPASE, AMYLASE in the last 168 hours. No results for input(s): AMMONIA in the last 168  hours. Coagulation Profile: No results for input(s): INR, PROTIME in the last 168 hours. Cardiac Enzymes: Recent Labs  Lab 01/14/18 0928  CKTOTAL 1,707*   BNP (last 3 results) No results for input(s): PROBNP in the last 8760 hours. HbA1C: No results for input(s): HGBA1C in the last 72 hours. CBG: No results for input(s): GLUCAP in the last 168 hours. Lipid Profile: No results for input(s): CHOL, HDL, LDLCALC, TRIG, CHOLHDL, LDLDIRECT in the last 72 hours. Thyroid Function Tests: No results for input(s): TSH, T4TOTAL, FREET4, T3FREE, THYROIDAB in the last 72 hours. Anemia Panel: No results for input(s): VITAMINB12, FOLATE, FERRITIN, TIBC, IRON, RETICCTPCT in the last 72 hours. Sepsis Labs: Recent Labs  Lab 01/14/18 1028  LATICACIDVEN 1.34    No results found for this or any previous visit (from the past 240 hour(s)).       Radiology Studies: Dg Pelvis 1-2 Views  Result Date: 01/14/2018 CLINICAL DATA:  Fall last night onto right side. EXAM: PELVIS - 1-2 VIEW COMPARISON:  None. FINDINGS: There is no evidence of pelvic fracture or diastasis. No pelvic bone lesions are seen. Mild degenerate change of the spine and hips. IMPRESSION: No acute fracture. Electronically Signed   By: Elberta Fortis M.D.   On: 01/14/2018 10:19   Dg Tibia/fibula Right  Result Date: 01/14/2018 CLINICAL DATA:  Fall last night onto right side with right lower leg pain. EXAM: RIGHT TIBIA AND FIBULA - 2 VIEW COMPARISON:  None. FINDINGS: Right total knee arthroplasty intact. No acute fracture or dislocation. IMPRESSION: No acute findings. Electronically Signed   By: Elberta Fortis M.D.   On: 01/14/2018 10:22   Ct Head Wo Contrast  Result Date: 01/14/2018 CLINICAL DATA:  Fall last night landing on right side. EXAM: CT HEAD WITHOUT CONTRAST CT CERVICAL SPINE WITHOUT CONTRAST TECHNIQUE: Multidetector CT imaging of the head and cervical spine was performed following the standard protocol without intravenous  contrast. Multiplanar CT image reconstructions of the cervical spine were also generated. COMPARISON:  None. FINDINGS: CT HEAD FINDINGS Brain: Ventricles, cisterns and other CSF spaces are within normal as there is mild age related atrophic change. Mild to moderate chronic ischemic microvascular disease is present. There is no mass, mass effect, shift of midline structures or acute hemorrhage. No evidence of acute infarction. Vascular: No hyperdense vessel or unexpected calcification. Skull: Normal. Negative for fracture or focal lesion. Sinuses/Orbits: No acute finding. Other: None. CT CERVICAL SPINE FINDINGS Alignment: 2 mm anterior subluxation of C7 on T1 which is degenerative due to moderate facet arthropathy. Skull base and vertebrae: Moderate spondylosis of the cervical spine. Vertebral body heights are maintained. Atlantoaxial articulation is within normal. There is moderate uncovertebral joint spurring and facet arthropathy. There is neural foraminal narrowing at multiple levels due to adjacent bony spurring. There is no acute fracture. Soft tissues and spinal canal: No prevertebral fluid or swelling. No visible canal hematoma. Disc  levels: Moderate disc space narrowing at the C5-6 and C6-7 levels. Upper chest: Negative. Other: None. IMPRESSION: No acute brain injury. Mild to moderate chronic ischemic microvascular disease and mild age related atrophic change. No acute cervical spine injury. Moderate spondylosis of the cervical spine with moderate disc disease at the C5-6 and C6-7 levels. Moderate bilateral neural foraminal narrowing at multiple levels due to adjacent bony spurring. Electronically Signed   By: Elberta Fortis M.D.   On: 01/14/2018 10:13   Ct Chest W Contrast  Result Date: 01/14/2018 CLINICAL DATA:  84 year old fell from bed.  Abdominal trauma. EXAM: CT CHEST, ABDOMEN, AND PELVIS WITH CONTRAST TECHNIQUE: Multidetector CT imaging of the chest, abdomen and pelvis was performed following the  standard protocol during bolus administration of intravenous contrast. CONTRAST:  ISOVUE-300 IOPAMIDOL (ISOVUE-300) INJECTION 61% COMPARISON:  12/21/2016 FINDINGS: CT CHEST FINDINGS Cardiovascular: Atherosclerotic calcifications in the coronary arteries and throughout the thoracic aorta. No evidence to suggest aortic aneurysm or dissection. Main pulmonary arteries are patent. Heart size is normal without significant pericardial fluid. Mediastinum/Nodes: Small hiatal hernia. No lymph node enlargement in the chest. Lungs/Pleura: Trachea and mainstem bronchi are patent. Negative for pneumothorax. Scarring at the lung apices, right side greater than left. Scarring and/or atelectasis at both lung bases. No large pleural effusions. 5 mm pleural-based nodule at the right lung base along the right major fissure on sequence 4, image 112. This was present on the study from 12/21/2016 and likely an incidental finding. Peripheral reticular densities in the right upper lung are suggestive for chronic changes, possibly mild fibrosis in these areas. No significant airspace disease or consolidation. Musculoskeletal: Both shoulders are located. Clavicles are intact. Old compression fracture at T9 with vertebral body cement. Old compression fracture at T11 with previous vertebral augmentation. No evidence for a displaced rib fracture. CT ABDOMEN PELVIS FINDINGS Hepatobiliary: Gallbladder is mildly distended without inflammatory changes. Normal appearance of the liver. No biliary dilatation. The main portal venous system is patent. Pancreas: Pancreas is poorly characterized. However, there appear to be a tubular or cystic structure along the top of the pancreatic body or adjacent to the pancreas body on sequence 2, image 57. This structure measures greater than 2.5 cm. In retrospect, this was present on 12/21/2016 and may have been present on the noncontrast study from 05/26/2010. No acute inflammatory changes involving the  pancreas. Spleen: Normal in size without focal abnormality. Adrenals/Urinary Tract: Adrenal glands are unremarkable. Normal appearance of both kidneys without hydronephrosis. No suspicious renal lesions. Urinary bladder is markedly distended extending into the lower abdomen. Stomach/Bowel: Small hiatal hernia. No evidence for bowel obstruction or focal bowel inflammation. Vascular/Lymphatic: Aorta and visceral arteries are heavily calcified without aneurysm. No lymph node enlargement in the abdomen or pelvis. Reproductive: Uterus and adnexal structures are unremarkable. Other: There is soft tissue in the presacral space on sequence 2, image 100. This area measures roughly 2.9 x 1.8 x 3.2 cm. Similar finding was present on the CT from 10/24/2016 and there may have also been some soft tissue in this area dating back to 05/26/2010. This is likely a benign etiology. There are low-density areas within this soft tissues suggesting there may be a fat or lipoma component. No evidence for adjacent bone destruction. Negative for ascites. Negative for free air. Musculoskeletal: Old compression fractures with vertebral body cement at L1 and L2. Disc space narrowing at L4-L5. No evidence for a new vertebral body fracture. IMPRESSION: No acute abnormalities within the chest, abdomen or pelvis. Urinary  bladder distension without hydronephrosis. Indeterminate soft tissue in the presacral space measuring up to 3.2 cm. This has been present for multiple years although there has probably been interval growth. Favor a benign etiology. There is low-density material within this structure and this could represent a benign myelolipoma. **An incidental finding of potential clinical significance has been found. Probable cystic or tubular structure along the superior aspect of the pancreatic body. This is poorly characterized on this examination but suspect this has been stable for at least 1 year if not longer. Consider further  characterization of this area with MRI in 6 months.** Old compression fractures and previous vertebral body augmentation procedures. No evidence for an acute vertebral body compression fracture. Chronic changes and scarring in lungs particularly in the right upper lung and apex regions. Electronically Signed   By: Richarda OverlieAdam  Henn M.D.   On: 01/14/2018 14:04   Ct Cervical Spine Wo Contrast  Result Date: 01/14/2018 CLINICAL DATA:  Fall last night landing on right side. EXAM: CT HEAD WITHOUT CONTRAST CT CERVICAL SPINE WITHOUT CONTRAST TECHNIQUE: Multidetector CT imaging of the head and cervical spine was performed following the standard protocol without intravenous contrast. Multiplanar CT image reconstructions of the cervical spine were also generated. COMPARISON:  None. FINDINGS: CT HEAD FINDINGS Brain: Ventricles, cisterns and other CSF spaces are within normal as there is mild age related atrophic change. Mild to moderate chronic ischemic microvascular disease is present. There is no mass, mass effect, shift of midline structures or acute hemorrhage. No evidence of acute infarction. Vascular: No hyperdense vessel or unexpected calcification. Skull: Normal. Negative for fracture or focal lesion. Sinuses/Orbits: No acute finding. Other: None. CT CERVICAL SPINE FINDINGS Alignment: 2 mm anterior subluxation of C7 on T1 which is degenerative due to moderate facet arthropathy. Skull base and vertebrae: Moderate spondylosis of the cervical spine. Vertebral body heights are maintained. Atlantoaxial articulation is within normal. There is moderate uncovertebral joint spurring and facet arthropathy. There is neural foraminal narrowing at multiple levels due to adjacent bony spurring. There is no acute fracture. Soft tissues and spinal canal: No prevertebral fluid or swelling. No visible canal hematoma. Disc levels: Moderate disc space narrowing at the C5-6 and C6-7 levels. Upper chest: Negative. Other: None. IMPRESSION: No  acute brain injury. Mild to moderate chronic ischemic microvascular disease and mild age related atrophic change. No acute cervical spine injury. Moderate spondylosis of the cervical spine with moderate disc disease at the C5-6 and C6-7 levels. Moderate bilateral neural foraminal narrowing at multiple levels due to adjacent bony spurring. Electronically Signed   By: Elberta Fortisaniel  Boyle M.D.   On: 01/14/2018 10:13   Ct Abdomen Pelvis W Contrast  Result Date: 01/14/2018 CLINICAL DATA:  84 year old fell from bed.  Abdominal trauma. EXAM: CT CHEST, ABDOMEN, AND PELVIS WITH CONTRAST TECHNIQUE: Multidetector CT imaging of the chest, abdomen and pelvis was performed following the standard protocol during bolus administration of intravenous contrast. CONTRAST:  100mL ISOVUE-300 IOPAMIDOL (ISOVUE-300) INJECTION 61% COMPARISON:  12/21/2016 FINDINGS: CT CHEST FINDINGS Cardiovascular: Atherosclerotic calcifications in the coronary arteries and throughout the thoracic aorta. No evidence to suggest aortic aneurysm or dissection. Main pulmonary arteries are patent. Heart size is normal without significant pericardial fluid. Mediastinum/Nodes: Small hiatal hernia. No lymph node enlargement in the chest. Lungs/Pleura: Trachea and mainstem bronchi are patent. Negative for pneumothorax. Scarring at the lung apices, right side greater than left. Scarring and/or atelectasis at both lung bases. No large pleural effusions. 5 mm pleural-based nodule at the right lung  base along the right major fissure on sequence 4, image 112. This was present on the study from 12/21/2016 and likely an incidental finding. Peripheral reticular densities in the right upper lung are suggestive for chronic changes, possibly mild fibrosis in these areas. No significant airspace disease or consolidation. Musculoskeletal: Both shoulders are located. Clavicles are intact. Old compression fracture at T9 with vertebral body cement. Old compression fracture at T11 with  previous vertebral augmentation. No evidence for a displaced rib fracture. CT ABDOMEN PELVIS FINDINGS Hepatobiliary: Gallbladder is mildly distended without inflammatory changes. Normal appearance of the liver. No biliary dilatation. The main portal venous system is patent. Pancreas: Pancreas is poorly characterized. However, there appear to be a tubular or cystic structure along the top of the pancreatic body or adjacent to the pancreas body on sequence 2, image 57. This structure measures greater than 2.5 cm. In retrospect, this was present on 12/21/2016 and may have been present on the noncontrast study from 05/26/2010. No acute inflammatory changes involving the pancreas. Spleen: Normal in size without focal abnormality. Adrenals/Urinary Tract: Adrenal glands are unremarkable. Normal appearance of both kidneys without hydronephrosis. No suspicious renal lesions. Urinary bladder is markedly distended extending into the lower abdomen. Stomach/Bowel: Small hiatal hernia. No evidence for bowel obstruction or focal bowel inflammation. Vascular/Lymphatic: Aorta and visceral arteries are heavily calcified without aneurysm. No lymph node enlargement in the abdomen or pelvis. Reproductive: Uterus and adnexal structures are unremarkable. Other: There is soft tissue in the presacral space on sequence 2, image 100. This area measures roughly 2.9 x 1.8 x 3.2 cm. Similar finding was present on the CT from 10/24/2016 and there may have also been some soft tissue in this area dating back to 05/26/2010. This is likely a benign etiology. There are low-density areas within this soft tissues suggesting there may be a fat or lipoma component. No evidence for adjacent bone destruction. Negative for ascites. Negative for free air. Musculoskeletal: Old compression fractures with vertebral body cement at L1 and L2. Disc space narrowing at L4-L5. No evidence for a new vertebral body fracture. IMPRESSION: No acute abnormalities within the  chest, abdomen or pelvis. Urinary bladder distension without hydronephrosis. Indeterminate soft tissue in the presacral space measuring up to 3.2 cm. This has been present for multiple years although there has probably been interval growth. Favor a benign etiology. There is low-density material within this structure and this could represent a benign myelolipoma. **An incidental finding of potential clinical significance has been found. Probable cystic or tubular structure along the superior aspect of the pancreatic body. This is poorly characterized on this examination but suspect this has been stable for at least 1 year if not longer. Consider further characterization of this area with MRI in 6 months.** Old compression fractures and previous vertebral body augmentation procedures. No evidence for an acute vertebral body compression fracture. Chronic changes and scarring in lungs particularly in the right upper lung and apex regions. Electronically Signed   By: Richarda Overlie M.D.   On: 01/14/2018 14:04   Dg Chest Port 1 View  Result Date: 01/14/2018 CLINICAL DATA:  Fall last night onto right side. EXAM: PORTABLE CHEST 1 VIEW COMPARISON:  10/01/2017 FINDINGS: Lungs are adequately inflated and otherwise clear. Cardiomediastinal silhouette is within normal. No acute fracture. Stable thoracic spine compression fractures. IMPRESSION: No acute findings. Electronically Signed   By: Elberta Fortis M.D.   On: 01/14/2018 10:20   Dg Femur 1v Right  Result Date: 01/14/2018 CLINICAL DATA:  Fall last night with right-sided pain. EXAM: RIGHT FEMUR 1 VIEW COMPARISON:  None. FINDINGS: Mild degenerate change of the right hip. Right total knee arthroplasty intact and normally located. No evidence of acute fracture or dislocation. IMPRESSION: No acute findings. Electronically Signed   By: Elberta Fortis M.D.   On: 01/14/2018 10:23        Scheduled Meds: . amLODipine  10 mg Oral Daily  . buPROPion  150 mg Oral BID PC  .  enoxaparin (LOVENOX) injection  40 mg Subcutaneous Q24H  . furosemide  20 mg Oral Daily  . levothyroxine  75 mcg Oral QAC breakfast  . losartan  100 mg Oral Daily  . pantoprazole  80 mg Oral Daily  . senna-docusate  2 tablet Oral QHS   Continuous Infusions: . sodium chloride 125 mL/hr at 01/14/18 2335     LOS: 1 day     Alwyn Ren, MD [facility] If 7PM-7AM, please contact night-coverage [email or web address] Password [facility] 01/15/2018, 9:29 AM

## 2018-01-15 NOTE — Progress Notes (Signed)
Pt unable to void through the night. Intermittent catheter done and 350 ml urine removed. Will continue to monitor [facility] [employer], [name] BoutonAmanda [name]

## 2018-01-15 NOTE — Evaluation (Signed)
Physical Therapy Evaluation Patient Details Name: [name] [name] MRN: [id number] DOB: 1930/12/22 Today's Date: 01/15/2018   History of Present Illness  this 84 y.o. female admitted after sustaining a fall in her apt.   She was on the floor from 730a - 9p.   She was found to have elevated CKD with mild rhabdomyolosis.   Work up revealed no fractures.  PMH includes:  chronic low back pain with h/o kyphoplasty, HTN, h/o BPPV, s/p TKA  Clinical Impression  Pt admitted with above. She demonstrates the below listed deficits and will benefit from continued PT to maximize safety and independence with mobility.  Pt presents to PTwith generalized weakness, decreased activity tolerance, and impaired balance..  Currently, she requires min - total A for basic mobility.  She lives at [facility] in independent Living apt, and was mod I - supervision for ADLs, but had caregiver assist for several hours in am to assist with IADLs and supervision with shower.   Family is very supportive.  In the past, pt did not care for SNF level care, and family instead provided 24 hour assistance to her.  They are uncertain at this time, if pt will choose to have 24 hour assist, or go to SNF at [facility], however, anticipate she will choose home with assistance.  Will follow acutely   Follow Up Recommendations SNF;Supervision/Assistance - 24 hour    Equipment Recommendations  None recommended by PT    Recommendations for Other Services       Precautions / Restrictions Precautions Precautions: Fall      Mobility  Bed Mobility Overal bed mobility: Needs Assistance Bed Mobility: Rolling;Sidelying to Sit Rolling: Mod assist Sidelying to sit: Mod assist       General bed mobility comments: assist to roll to Lt and assist to lift trunk and scoot to EOB   Transfers Overall transfer level: Needs assistance Equipment used: Rolling walker (2 wheeled) Transfers: Sit to/from [employer] Sit to  Stand: Mod assist Stand pivot transfers: Min assist;+2 safety/equipment       General transfer comment: Pt requires assist to lift into standing, and assist to steady.  She initially required +2 assist for safety, but progressed to +1 assist   Ambulation/Gait Ambulation/Gait assistance: Min assist Gait Distance (Feet): 50 Feet Assistive device: Rolling walker (2 wheeled) Gait Pattern/deviations: Step-through pattern     General Gait Details: small steps, assistance to guide and redirect the RW , she was not able to do so independently. Required cues for task and safety.   Stairs            Wheelchair Mobility    Modified Rankin (Stroke Patients Only)       Balance Overall balance assessment: Needs assistance Sitting-balance support: Feet supported Sitting balance-Leahy Scale: Fair     Standing balance support: Bilateral upper extremity supported Standing balance-Leahy Scale: Poor Standing balance comment: reliant on UE support                              Pertinent Vitals/Pain Pain Assessment: Faces Faces Pain Scale: Hurts little more Pain Location: generalized  Pain Descriptors / Indicators: Grimacing;Guarding Pain Intervention(s): Monitored during session    Home Living Family/patient expects to be discharged to:: Private residence(ILF at [facility] ) Living Arrangements: Alone Available Help at Discharge: Family;Personal care attendant;Available 24 hours/day Type of Home: Independent living facility Home Access: Elevator;Level entry  Home Layout: One level Home Equipment: Cane - single point;Grab bars - toilet;Grab bars - tub/shower;Hand held shower head;Shower seat;Bedside commode;Walker - 4 wheels Additional Comments: Pt has PC 2-4 hours 5days/week and family assists on weekends     Prior Function Level of Independence: Needs assistance   Gait / Transfers Assistance Needed: ambulates with RW   ADL's / Homemaking Assistance  Needed: Pt is able to perform ADLs with supervision.  Assist for IADLs   Comments: Pt sleep in recliner      Hand Dominance   Dominant Hand: Right    Extremity/Trunk Assessment        Lower Extremity Assessment Lower Extremity Assessment: Generalized weakness       Communication   Communication: No difficulties  Cognition Arousal/Alertness: Awake/alert Behavior During Therapy: WFL for tasks assessed/performed Overall Cognitive Status: History of cognitive impairments - at baseline                                 General Comments: Pt with obvious memory deficits, as daughter frequently had to correct info re: PLOF.  This appears to be her baseline       General Comments      Exercises     Assessment/Plan    PT Assessment Patient needs continued PT services  PT Problem List Decreased strength;Decreased activity tolerance;Decreased mobility       PT Treatment Interventions Gait training;Stair training;Functional mobility training;DME instruction;Therapeutic activities;Therapeutic exercise;Patient/family education    PT Goals (Current goals can be found in the Care Plan section)  Acute Rehab PT Goals Patient Stated Goal: to go home and regain independence  PT Goal Formulation: With patient/family Time For Goal Achievement: 01/29/18 Potential to Achieve Goals: Good    Frequency Min 3X/week   Barriers to discharge        Co-evaluation PT/OT/SLP Co-Evaluation/Treatment: Yes Reason for Co-Treatment: For patient/therapist safety PT goals addressed during session: Mobility/safety with mobility         AM-PAC PT "6 Clicks" Daily Activity  Outcome Measure Difficulty turning over in bed (including adjusting bedclothes, sheets and blankets)?: Unable Difficulty moving from lying on back to sitting on the side of the bed? : Unable Difficulty sitting down on and standing up from a chair with arms (e.g., wheelchair, bedside commode, etc,.)?:  Unable Help needed moving to and from a bed to chair (including a wheelchair)?: A Little Help needed walking in hospital room?: A Little Help needed climbing 3-5 steps with a railing? : A Lot 6 Click Score: 11    End of Session Equipment Utilized During Treatment: Gait belt Activity Tolerance: Patient tolerated treatment well Patient left: in chair;with family/visitor present;with chair alarm set Nurse Communication: Mobility status PT Visit Diagnosis: Unsteadiness on feet (R26.81);Muscle weakness (generalized) (M62.81)    Time: [id number] PT Time Calculation (min) (ACUTE ONLY): 41 min   Charges:   PT Evaluation $PT Eval Low Complexity: 1 Low PT Treatments $Gait Training: 8-22 mins   PT G Codes:        Marella BileSharron Garry Bochicchio, PT Pager: [phone] 01/15/2018   Aerilynn Goin, Clois DupesSHARRON 01/15/2018, 10:05 PM

## 2018-01-16 ENCOUNTER — Encounter: Payer: Medicare Other | Admitting: Internal Medicine

## 2018-01-16 DIAGNOSIS — W19XXXA Unspecified fall, initial encounter: Secondary | ICD-10-CM | POA: Diagnosis not present

## 2018-01-16 DIAGNOSIS — I10 Essential (primary) hypertension: Secondary | ICD-10-CM | POA: Diagnosis not present

## 2018-01-16 DIAGNOSIS — T796XXA Traumatic ischemia of muscle, initial encounter: Secondary | ICD-10-CM | POA: Diagnosis not present

## 2018-01-16 LAB — BASIC METABOLIC PANEL
Anion gap: 5 (ref 5–15)
BUN: 21 mg/dL — AB (ref 6–20)
CO2: 24 mmol/L (ref 22–32)
CREATININE: 0.56 mg/dL (ref 0.44–1.00)
Calcium: 8.6 mg/dL — ABNORMAL LOW (ref 8.9–10.3)
Chloride: 106 mmol/L (ref 101–111)
GFR calc Af Amer: >60 mL/min (ref >60)
Glucose, Bld: 103 mg/dL — ABNORMAL HIGH (ref 65–99)
Potassium: 4.3 mmol/L (ref 3.5–5.1)
SODIUM: 135 mmol/L (ref 135–145)

## 2018-01-16 LAB — CBC
HCT: 29.8 % — ABNORMAL LOW (ref 36.0–46.0)
Hemoglobin: 9.7 g/dL — ABNORMAL LOW (ref 12.0–15.0)
MCH: 29.1 pg (ref 26.0–34.0)
MCHC: 32.6 g/dL (ref 30.0–36.0)
MCV: 89.5 fL (ref 78.0–100.0)
PLATELETS: 75 10*3/uL — AB (ref 150–400)
RBC: 3.33 MIL/uL — ABNORMAL LOW (ref 3.87–5.11)
RDW: 16.9 % — AB (ref 11.5–15.5)
WBC: 5.1 10*3/uL (ref 4.0–10.5)

## 2018-01-16 LAB — CK: CK TOTAL: 621 U/L — AB (ref 38–234)

## 2018-01-16 MED ORDER — FUROSEMIDE 10 MG/ML IJ SOLN
20.0000 mg | Freq: Once | INTRAMUSCULAR | Status: AC
  Administered 2018-01-16: 20 mg via INTRAVENOUS
  Filled 2018-01-16: qty 2

## 2018-01-16 MED ORDER — TRAMADOL HCL 50 MG PO TABS: 50.0000 mg | ORAL_TABLET | Freq: Four times a day (QID) | ORAL | 0 refills | Status: AC | PRN

## 2018-01-16 NOTE — Discharge Summary (Signed)
Physician Discharge Summary  [name] [name] RUE:[id number] DOB: 02-02-1931 DOA: 01/14/2018  PCP: Oneal Grout, MD  Admit date: 01/14/2018 Discharge date: 01/16/2018  Admitted From: home Disposition:  snf Recommendations for Outpatient Follow-up:  1. Follow up with PCP in 1-2 weeks 2. Please obtain BMP/CBC in one week  Home Health none Equipment/Devices:none Discharge Condition:stable  CODE STATUSDNR Diet recommendation:  CARDIAC Brief/Interim Summary:84 y.o.femalewith medical history significant ofhypertension, depression, anxiety, hypothyroidism, chronic back pain who fell last night at the independent living facility.She states that she was walking with her walker, grabbed a large bottle of water, lost her footing and fell. She fell around 9 PM and was on the ground until around 7:30 AM. EMS was called. She admits to low back pain. Prior to this, she had been doing well. No recent illnesses, no fevers, chills, chest pain, abdominal pain, cough, shortness of breath, nausea vomiting or diarrhea.  ED Course:Labs significant for CK 1707.CT head and cervical spine were negative for acute injuries. Chest x-ray negative. Right pelvic, femur, tibia-fibula x-rays without acute fracture. CT chest abdomen pelvis without acute abnormalities. Due to continued weakness and pain, patient is referred for further management at [facility].    Discharge Diagnoses:  Principal Problem:   Fall Active Problems:   Essential hypertension   GERD (gastroesophageal reflux disease)   Depression with anxiety   Chronic bilateral low back pain without sciatica  1] status post fall and deconditioning-no evidence of acute fracture.  Patient lives at independent living facility [facility].Patient was seen by PT RECOMMENDED skilled nursing facility.  2] mild rhabdo status post fall being on the floor close to 10 hours. Resolved with iv fluids.  3] hypertension Norvasc and Cozaar and  lasix  4] hypothyroidism continue Synthroid.  5] incidental pancreatic cyst follow-up with MRI as an outpatient in 6 months.  6] depression and anxiety continue Xanax and Wellbutrin.  7]chronic dependent edema-- restart lasix.    Discharge Instructions  Discharge Instructions    Call MD for:  difficulty breathing, headache or visual disturbances   Complete by:  As directed    Call MD for:  persistant dizziness or light-headedness   Complete by:  As directed    Call MD for:  persistant nausea and vomiting   Complete by:  As directed    Call MD for:  severe uncontrolled pain   Complete by:  As directed    Diet - low sodium heart healthy   Complete by:  As directed    Increase activity slowly   Complete by:  As directed      Allergies as of 01/16/2018   No Known Allergies     Medication List    STOP taking these medications   dextromethorphan-guaiFENesin 30-600 MG 12hr tablet Commonly known as:  MUCINEX DM   diphenhydramine-acetaminophen 25-500 MG Tabs tablet Commonly known as:  TYLENOL PM   loratadine 10 MG tablet Commonly known as:  CLARITIN   traMADol 50 MG tablet Commonly known as:  ULTRAM     TAKE these medications   acetaminophen 500 MG tablet Commonly known as:  TYLENOL Take 2 tablets (1,000 mg total) by mouth 2 (two) times daily. What changed:  additional instructions   ALPRAZolam 0.25 MG tablet Commonly known as:  XANAX TAKE 1 TABLET BY MOUTH EVERY DAY AS NEEDED   amLODipine 10 MG tablet Commonly known as:  NORVASC Take 1 tablet (10 mg total) by mouth daily.   buPROPion 150 MG 12 hr tablet  Commonly known as:  WELLBUTRIN SR Take 1 tablet (150 mg total) by mouth 2 (two) times daily after a meal.   calcium carbonate 600 MG Tabs tablet Commonly known as:  OS-CAL Take 600 mg by mouth 2 (two) times daily with a meal.   cholecalciferol 1000 units tablet Commonly known as:  VITAMIN D Take 1,000 Units by mouth daily.   Cranberry 12600 MG  Caps Take 12,600 mg by mouth as needed.   fish oil-omega-3 fatty acids 1000 MG capsule Take 1 g by mouth daily.   furosemide 20 MG tablet Commonly known as:  LASIX Take 20 mg by mouth daily.   levothyroxine 75 MCG tablet Commonly known as:  SYNTHROID, LEVOTHROID Take 1 tablet (75 mcg total) by mouth daily before breakfast.   losartan 100 MG tablet Commonly known as:  COZAAR Take 1 tablet (100 mg total) by mouth daily.   multivitamin with minerals Tabs tablet Take 1 tablet by mouth daily.   nitrofurantoin 50 MG capsule Commonly known as:  MACRODANTIN Take 50 mg by mouth daily.   omeprazole 40 MG capsule Commonly known as:  PRILOSEC Take 40 mg by mouth daily.   sennosides-docusate sodium 8.6-50 MG tablet Commonly known as:  SENOKOT-S Take 2 tablets by mouth at bedtime.   STOOL SOFTENER PO Take 2 tablets by mouth daily.   TYMLOS 3120 MCG/1.56ML Sopn Generic drug:  Abaloparatide INJECT SUBCUTANEOUSLY ONCE DAILY       Contact information for follow-up providers    Oneal Grout, MD Follow up.   Specialty:  Internal Medicine Contact information: [address] [place] [address] [phone]            Contact information for after-discharge care    Destination    [facility] .   Service:  Skilled Nursing Contact information: [address] [place] [address] [phone]                 No Known Allergies  Consultations:  none   Procedures/Studies: Dg Pelvis 1-2 Views  Result Date: 01/14/2018 CLINICAL DATA:  Fall last night onto right side. EXAM: PELVIS - 1-2 VIEW COMPARISON:  None. FINDINGS: There is no evidence of pelvic fracture or diastasis. No pelvic bone lesions are seen. Mild degenerate change of the spine and hips. IMPRESSION: No acute fracture. Electronically Signed   By: Elberta Fortis M.D.   On: 01/14/2018 10:19   Dg Tibia/fibula Right  Result Date: 01/14/2018 CLINICAL DATA:   Fall last night onto right side with right lower leg pain. EXAM: RIGHT TIBIA AND FIBULA - 2 VIEW COMPARISON:  None. FINDINGS: Right total knee arthroplasty intact. No acute fracture or dislocation. IMPRESSION: No acute findings. Electronically Signed   By: Elberta Fortis M.D.   On: 01/14/2018 10:22   Ct Head Wo Contrast  Result Date: 01/14/2018 CLINICAL DATA:  Fall last night landing on right side. EXAM: CT HEAD WITHOUT CONTRAST CT CERVICAL SPINE WITHOUT CONTRAST TECHNIQUE: Multidetector CT imaging of the head and cervical spine was performed following the standard protocol without intravenous contrast. Multiplanar CT image reconstructions of the cervical spine were also generated. COMPARISON:  None. FINDINGS: CT HEAD FINDINGS Brain: Ventricles, cisterns and other CSF spaces are within normal as there is mild age related atrophic change. Mild to moderate chronic ischemic microvascular disease is present. There is no mass, mass effect, shift of midline structures or acute hemorrhage. No evidence of acute infarction. Vascular: No hyperdense vessel or unexpected  calcification. Skull: Normal. Negative for fracture or focal lesion. Sinuses/Orbits: No acute finding. Other: None. CT CERVICAL SPINE FINDINGS Alignment: 2 mm anterior subluxation of C7 on T1 which is degenerative due to moderate facet arthropathy. Skull base and vertebrae: Moderate spondylosis of the cervical spine. Vertebral body heights are maintained. Atlantoaxial articulation is within normal. There is moderate uncovertebral joint spurring and facet arthropathy. There is neural foraminal narrowing at multiple levels due to adjacent bony spurring. There is no acute fracture. Soft tissues and spinal canal: No prevertebral fluid or swelling. No visible canal hematoma. Disc levels: Moderate disc space narrowing at the C5-6 and C6-7 levels. Upper chest: Negative. Other: None. IMPRESSION: No acute brain injury. Mild to moderate chronic ischemic microvascular  disease and mild age related atrophic change. No acute cervical spine injury. Moderate spondylosis of the cervical spine with moderate disc disease at the C5-6 and C6-7 levels. Moderate bilateral neural foraminal narrowing at multiple levels due to adjacent bony spurring. Electronically Signed   By: Elberta Fortis M.D.   On: 01/14/2018 10:13   Ct Chest W Contrast  Result Date: 01/14/2018 CLINICAL DATA:  84 year old fell from bed.  Abdominal trauma. EXAM: CT CHEST, ABDOMEN, AND PELVIS WITH CONTRAST TECHNIQUE: Multidetector CT imaging of the chest, abdomen and pelvis was performed following the standard protocol during bolus administration of intravenous contrast. CONTRAST:  ISOVUE-300 IOPAMIDOL (ISOVUE-300) INJECTION 61% COMPARISON:  12/21/2016 FINDINGS: CT CHEST FINDINGS Cardiovascular: Atherosclerotic calcifications in the coronary arteries and throughout the thoracic aorta. No evidence to suggest aortic aneurysm or dissection. Main pulmonary arteries are patent. Heart size is normal without significant pericardial fluid. Mediastinum/Nodes: Small hiatal hernia. No lymph node enlargement in the chest. Lungs/Pleura: Trachea and mainstem bronchi are patent. Negative for pneumothorax. Scarring at the lung apices, right side greater than left. Scarring and/or atelectasis at both lung bases. No large pleural effusions. 5 mm pleural-based nodule at the right lung base along the right major fissure on sequence 4, image 112. This was present on the study from 12/21/2016 and likely an incidental finding. Peripheral reticular densities in the right upper lung are suggestive for chronic changes, possibly mild fibrosis in these areas. No significant airspace disease or consolidation. Musculoskeletal: Both shoulders are located. Clavicles are intact. Old compression fracture at T9 with vertebral body cement. Old compression fracture at T11 with previous vertebral augmentation. No evidence for a displaced rib fracture.  CT ABDOMEN PELVIS FINDINGS Hepatobiliary: Gallbladder is mildly distended without inflammatory changes. Normal appearance of the liver. No biliary dilatation. The main portal venous system is patent. Pancreas: Pancreas is poorly characterized. However, there appear to be a tubular or cystic structure along the top of the pancreatic body or adjacent to the pancreas body on sequence 2, image 57. This structure measures greater than 2.5 cm. In retrospect, this was present on 12/21/2016 and may have been present on the noncontrast study from 05/26/2010. No acute inflammatory changes involving the pancreas. Spleen: Normal in size without focal abnormality. Adrenals/Urinary Tract: Adrenal glands are unremarkable. Normal appearance of both kidneys without hydronephrosis. No suspicious renal lesions. Urinary bladder is markedly distended extending into the lower abdomen. Stomach/Bowel: Small hiatal hernia. No evidence for bowel obstruction or focal bowel inflammation. Vascular/Lymphatic: Aorta and visceral arteries are heavily calcified without aneurysm. No lymph node enlargement in the abdomen or pelvis. Reproductive: Uterus and adnexal structures are unremarkable. Other: There is soft tissue in the presacral space on sequence 2, image 100. This area measures roughly 2.9 x 1.8 x 3.2  cm. Similar finding was present on the CT from 10/24/2016 and there may have also been some soft tissue in this area dating back to 05/26/2010. This is likely a benign etiology. There are low-density areas within this soft tissues suggesting there may be a fat or lipoma component. No evidence for adjacent bone destruction. Negative for ascites. Negative for free air. Musculoskeletal: Old compression fractures with vertebral body cement at L1 and L2. Disc space narrowing at L4-L5. No evidence for a new vertebral body fracture. IMPRESSION: No acute abnormalities within the chest, abdomen or pelvis. Urinary bladder distension without  hydronephrosis. Indeterminate soft tissue in the presacral space measuring up to 3.2 cm. This has been present for multiple years although there has probably been interval growth. Favor a benign etiology. There is low-density material within this structure and this could represent a benign myelolipoma. **An incidental finding of potential clinical significance has been found. Probable cystic or tubular structure along the superior aspect of the pancreatic body. This is poorly characterized on this examination but suspect this has been stable for at least 1 year if not longer. Consider further characterization of this area with MRI in 6 months.** Old compression fractures and previous vertebral body augmentation procedures. No evidence for an acute vertebral body compression fracture. Chronic changes and scarring in lungs particularly in the right upper lung and apex regions. Electronically Signed   By: Richarda Overlie M.D.   On: 01/14/2018 14:04   Ct Cervical Spine Wo Contrast  Result Date: 01/14/2018 CLINICAL DATA:  Fall last night landing on right side. EXAM: CT HEAD WITHOUT CONTRAST CT CERVICAL SPINE WITHOUT CONTRAST TECHNIQUE: Multidetector CT imaging of the head and cervical spine was performed following the standard protocol without intravenous contrast. Multiplanar CT image reconstructions of the cervical spine were also generated. COMPARISON:  None. FINDINGS: CT HEAD FINDINGS Brain: Ventricles, cisterns and other CSF spaces are within normal as there is mild age related atrophic change. Mild to moderate chronic ischemic microvascular disease is present. There is no mass, mass effect, shift of midline structures or acute hemorrhage. No evidence of acute infarction. Vascular: No hyperdense vessel or unexpected calcification. Skull: Normal. Negative for fracture or focal lesion. Sinuses/Orbits: No acute finding. Other: None. CT CERVICAL SPINE FINDINGS Alignment: 2 mm anterior subluxation of C7 on T1 which is  degenerative due to moderate facet arthropathy. Skull base and vertebrae: Moderate spondylosis of the cervical spine. Vertebral body heights are maintained. Atlantoaxial articulation is within normal. There is moderate uncovertebral joint spurring and facet arthropathy. There is neural foraminal narrowing at multiple levels due to adjacent bony spurring. There is no acute fracture. Soft tissues and spinal canal: No prevertebral fluid or swelling. No visible canal hematoma. Disc levels: Moderate disc space narrowing at the C5-6 and C6-7 levels. Upper chest: Negative. Other: None. IMPRESSION: No acute brain injury. Mild to moderate chronic ischemic microvascular disease and mild age related atrophic change. No acute cervical spine injury. Moderate spondylosis of the cervical spine with moderate disc disease at the C5-6 and C6-7 levels. Moderate bilateral neural foraminal narrowing at multiple levels due to adjacent bony spurring. Electronically Signed   By: Elberta Fortis M.D.   On: 01/14/2018 10:13   Ct Abdomen Pelvis W Contrast  Result Date: 01/14/2018 CLINICAL DATA:  84 year old fell from bed.  Abdominal trauma. EXAM: CT CHEST, ABDOMEN, AND PELVIS WITH CONTRAST TECHNIQUE: Multidetector CT imaging of the chest, abdomen and pelvis was performed following the standard protocol during bolus administration of intravenous contrast.  CONTRAST:  100mL ISOVUE-300 IOPAMIDOL (ISOVUE-300) INJECTION 61% COMPARISON:  12/21/2016 FINDINGS: CT CHEST FINDINGS Cardiovascular: Atherosclerotic calcifications in the coronary arteries and throughout the thoracic aorta. No evidence to suggest aortic aneurysm or dissection. Main pulmonary arteries are patent. Heart size is normal without significant pericardial fluid. Mediastinum/Nodes: Small hiatal hernia. No lymph node enlargement in the chest. Lungs/Pleura: Trachea and mainstem bronchi are patent. Negative for pneumothorax. Scarring at the lung apices, right side greater than left.  Scarring and/or atelectasis at both lung bases. No large pleural effusions. 5 mm pleural-based nodule at the right lung base along the right major fissure on sequence 4, image 112. This was present on the study from 12/21/2016 and likely an incidental finding. Peripheral reticular densities in the right upper lung are suggestive for chronic changes, possibly mild fibrosis in these areas. No significant airspace disease or consolidation. Musculoskeletal: Both shoulders are located. Clavicles are intact. Old compression fracture at T9 with vertebral body cement. Old compression fracture at T11 with previous vertebral augmentation. No evidence for a displaced rib fracture. CT ABDOMEN PELVIS FINDINGS Hepatobiliary: Gallbladder is mildly distended without inflammatory changes. Normal appearance of the liver. No biliary dilatation. The main portal venous system is patent. Pancreas: Pancreas is poorly characterized. However, there appear to be a tubular or cystic structure along the top of the pancreatic body or adjacent to the pancreas body on sequence 2, image 57. This structure measures greater than 2.5 cm. In retrospect, this was present on 12/21/2016 and may have been present on the noncontrast study from 05/26/2010. No acute inflammatory changes involving the pancreas. Spleen: Normal in size without focal abnormality. Adrenals/Urinary Tract: Adrenal glands are unremarkable. Normal appearance of both kidneys without hydronephrosis. No suspicious renal lesions. Urinary bladder is markedly distended extending into the lower abdomen. Stomach/Bowel: Small hiatal hernia. No evidence for bowel obstruction or focal bowel inflammation. Vascular/Lymphatic: Aorta and visceral arteries are heavily calcified without aneurysm. No lymph node enlargement in the abdomen or pelvis. Reproductive: Uterus and adnexal structures are unremarkable. Other: There is soft tissue in the presacral space on sequence 2, image 100. This area  measures roughly 2.9 x 1.8 x 3.2 cm. Similar finding was present on the CT from 10/24/2016 and there may have also been some soft tissue in this area dating back to 05/26/2010. This is likely a benign etiology. There are low-density areas within this soft tissues suggesting there may be a fat or lipoma component. No evidence for adjacent bone destruction. Negative for ascites. Negative for free air. Musculoskeletal: Old compression fractures with vertebral body cement at L1 and L2. Disc space narrowing at L4-L5. No evidence for a new vertebral body fracture. IMPRESSION: No acute abnormalities within the chest, abdomen or pelvis. Urinary bladder distension without hydronephrosis. Indeterminate soft tissue in the presacral space measuring up to 3.2 cm. This has been present for multiple years although there has probably been interval growth. Favor a benign etiology. There is low-density material within this structure and this could represent a benign myelolipoma. **An incidental finding of potential clinical significance has been found. Probable cystic or tubular structure along the superior aspect of the pancreatic body. This is poorly characterized on this examination but suspect this has been stable for at least 1 year if not longer. Consider further characterization of this area with MRI in 6 months.** Old compression fractures and previous vertebral body augmentation procedures. No evidence for an acute vertebral body compression fracture. Chronic changes and scarring in lungs particularly in the right upper  lung and apex regions. Electronically Signed   By: Richarda Overlie M.D.   On: 01/14/2018 14:04   Dg Chest Port 1 View  Result Date: 01/14/2018 CLINICAL DATA:  Fall last night onto right side. EXAM: PORTABLE CHEST 1 VIEW COMPARISON:  10/01/2017 FINDINGS: Lungs are adequately inflated and otherwise clear. Cardiomediastinal silhouette is within normal. No acute fracture. Stable thoracic spine compression  fractures. IMPRESSION: No acute findings. Electronically Signed   By: Elberta Fortis M.D.   On: 01/14/2018 10:20   Dg Femur 1v Right  Result Date: 01/14/2018 CLINICAL DATA:  Fall last night with right-sided pain. EXAM: RIGHT FEMUR 1 VIEW COMPARISON:  None. FINDINGS: Mild degenerate change of the right hip. Right total knee arthroplasty intact and normally located. No evidence of acute fracture or dislocation. IMPRESSION: No acute findings. Electronically Signed   By: Elberta Fortis M.D.   On: 01/14/2018 10:23    (Echo, Carotid, EGD, Colonoscopy, ERCP)    Subjective:   Discharge Exam: Vitals:   01/16/18 0859 01/16/18 1000  BP: (!) 185/61 (!) 170/55  Pulse: 75 66  Resp:    Temp:    SpO2: 99%    Vitals:   01/16/18 0517 01/16/18 0606 01/16/18 0859 01/16/18 1000  BP: (!) 175/62 (!) 177/57 (!) 185/61 (!) 170/55  Pulse: 73 66 75 66  Resp: 15     Temp: 97.8 F (36.6 C)     TempSrc: Oral     SpO2: 96% 97% 99%   Weight:      Height:        General: Pt is alert, awake, not in acute distress Cardiovascular: RRR, S1/S2 +, no rubs, no gallops Respiratory: CTA bilaterally, no wheezing, no rhonchi Abdominal: Soft, NT, ND, bowel sounds + Extremities: no edema, no cyanosis    The results of significant diagnostics from this hospitalization (including imaging, microbiology, ancillary and laboratory) are listed below for reference.     Microbiology: No results found for this or any previous visit (from the past 240 hour(s)).   Labs: BNP (last 3 results) No results for input(s): BNP in the last 8760 hours. Basic Metabolic Panel: Recent Labs  Lab 01/14/18 0928 01/15/18 0524 01/16/18 0356  NA 135 138 135  K 4.0 3.8 4.3  CL 100* 106 106  CO2 26 26 24   GLUCOSE 135* 104* 103*  BUN 25* 19 21*  CREATININE 0.62 0.63 0.56  CALCIUM 9.6 8.9 8.6*   Liver Function Tests: Recent Labs  Lab 01/14/18 0928  AST 60*  ALT 26  ALKPHOS 85  BILITOT 0.8  PROT 7.9  ALBUMIN 4.3   No  results for input(s): LIPASE, AMYLASE in the last 168 hours. No results for input(s): AMMONIA in the last 168 hours. CBC: Recent Labs  Lab 01/14/18 0928 01/15/18 0524 01/16/18 0356  WBC 10.2 5.7 5.1  NEUTROABS 7.9*  --   --   HGB 12.3 10.9* 9.7*  HCT 37.3 33.7* 29.8*  MCV 88.0 89.6 89.5  PLT 98* 80* 75*   Cardiac Enzymes: Recent Labs  Lab 01/14/18 0928 01/16/18 0356  CKTOTAL 1,707* 621*   BNP: Invalid input(s): POCBNP CBG: No results for input(s): GLUCAP in the last 168 hours. D-Dimer No results for input(s): DDIMER in the last 72 hours. Hgb A1c No results for input(s): HGBA1C in the last 72 hours. Lipid Profile No results for input(s): CHOL, HDL, LDLCALC, TRIG, CHOLHDL, LDLDIRECT in the last 72 hours. Thyroid function studies No results for input(s): TSH, T4TOTAL, T3FREE, THYROIDAB in  the last 72 hours.  Invalid input(s): FREET3 Anemia work up No results for input(s): VITAMINB12, FOLATE, FERRITIN, TIBC, IRON, RETICCTPCT in the last 72 hours. Urinalysis    Component Value Date/Time   COLORURINE YELLOW 01/14/2018 1633   APPEARANCEUR HAZY (A) 01/14/2018 1633   LABSPEC >1.046 (H) 01/14/2018 1633   PHURINE 6.0 01/14/2018 1633   GLUCOSEU NEGATIVE 01/14/2018 1633   HGBUR SMALL (A) 01/14/2018 1633   BILIRUBINUR NEGATIVE 01/14/2018 1633   KETONESUR NEGATIVE 01/14/2018 1633   PROTEINUR NEGATIVE 01/14/2018 1633   UROBILINOGEN 0.2 03/24/2015 1527   NITRITE NEGATIVE 01/14/2018 1633   LEUKOCYTESUR MODERATE (A) 01/14/2018 1633   Sepsis Labs Invalid input(s): PROCALCITONIN,  WBC,  LACTICIDVEN Microbiology No results found for this or any previous visit (from the past 240 hour(s)).   Time coordinating discharge: 34 minutes  SIGNED:   Alwyn Ren, MD  [facility] 01/16/2018, 12:42 PM Pager   If 7PM-7AM, please contact night-coverage [email or web address] Password [email or web address]

## 2018-01-16 NOTE — Progress Notes (Signed)
Occupational Therapy Treatment Patient Details Name: [name] [name] MRN: [id number] DOB: 1931-01-09 Today's Date: 01/16/2018    History of present illness this 84 y.o. female admitted after sustaining a fall in her apt.   She was on the floor from 730a - 9p.   She was found to have elevated CKD with mild rhabdomyolosis.   Work up revealed no fractures.  PMH includes:  chronic low back pain with h/o kyphoplasty, HTN, h/o BPPV, s/p TKA   OT comments  Pt plans to [facility] healthcare [facility]  Follow Up Recommendations  SNF    Equipment Recommendations  None recommended by OT       Precautions / Restrictions Precautions Precautions: Fall       Mobility Bed Mobility               General bed mobility comments: pt in chair  Transfers Overall transfer level: Needs assistance Equipment used: Rolling walker (2 wheeled) Transfers: Sit to/from Stand Sit to Stand: Min assist;Mod assist         General transfer comment: VC for hand placement        ADL either performed or assessed with clinical judgement   ADL Overall ADL's : Needs assistance/impaired                             Toileting- Clothing Manipulation and Hygiene: Moderate assistance;Sit to/from stand;Cueing for sequencing;Cueing for safety         General ADL Comments: OT session focused on sit to stand from chair- proper hand placement and controlled sit     Vision Patient Visual Report: No change from baseline     Perception     Praxis      Cognition Arousal/Alertness: Awake/alert Behavior During Therapy: WFL for tasks assessed/performed Overall Cognitive Status: History of cognitive impairments - at baseline                                 General Comments: Pt with obvious memory deficits, as daughter frequently had to correct info re: PLOF.  This appears to be her baseline                    Pertinent Vitals/ Pain       Faces Pain Scale: Hurts a little  bit Pain Location: generalized  Pain Intervention(s): Limited activity within patient's tolerance         Frequency  Min 2X/week        Progress Toward Goals  OT Goals(current goals can now be found in the care plan section)  Progress towards OT goals: Progressing toward goals     Plan Discharge plan remains appropriate    Co-evaluation                 AM-PAC PT "6 Clicks" Daily Activity     Outcome Measure   Help from another person eating meals?: None Help from another person taking care of personal grooming?: A Little Help from another person toileting, which includes using toliet, bedpan, or urinal?: A Lot Help from another person bathing (including washing, rinsing, drying)?: A Lot Help from another person to put on and taking off regular upper body clothing?: A Little Help from another person to put on and taking off regular lower body clothing?: Total 6 Click Score: 15    End of Session  Equipment Utilized During Treatment: Gait belt;Rolling walker  OT Visit Diagnosis: Unsteadiness on feet (R26.81);Pain Pain - part of body: (generalized )   Activity Tolerance Patient tolerated treatment well   Patient Left in chair;with call bell/phone within reach;with chair alarm set;with family/visitor present   Nurse Communication Mobility status        Time: [id number] OT Time Calculation (min): 21 min  Charges: OT General Charges $OT Visit: 1 Visit OT Treatments $Self Care/Home Management : 8-22 mins  ArdencroftLori Ashanti Littles, [place]OT [phone]   Alba CoryREDDING, Dai Apel D 01/16/2018, 12:08 PM

## 2018-01-16 NOTE — Clinical Social Work Note (Addendum)
Clinical Social Work Assessment  Patient Details  Name: [name] [name] MRN: [id number] Date of Birth: [birth year]  Date of referral:  01/16/18               Reason for consult:  Facility Placement, Discharge Planning                Permission sought to share information with:  Family Supports Permission granted to share information::  Yes, Verbal Permission Granted  Name::        Agency::  [facility]  Relationship::  Son -Psychologist, sport and exercise Information:     Housing/Transportation Living arrangements for the past 2 months:  [place] of Information:  Adult Children, Patient Patient Interpreter Needed:  None Criminal Activity/Legal Involvement Pertinent to Current Situation/Hospitalization:  No - Comment as needed Significant Relationships:  Adult Children Lives with:  Facility Resident Do you feel safe going back to the place where you live?  Yes Need for family participation in patient care:  Yes (Comment)  Care giving concerns:  SNF placement for short rehab.   Social Worker assessment / plan: CSW met with patient, son-[name] [name] and daughter in law at beside to discuss patient discharge plan to SNF. Patient lives in [place] at [facility]. She is requiring 24 hour assistance and care at this time. Patient son states the patient can benefit from SNF rehab since she is not progressing as quickly with physical therapy. Patient has bed at [facility], where she will complete rehab.   FL2 complete. PASRR reviewed.   Plan: [place] has been initiated.   Employment status:  Retired Nurse, adult PT Recommendations:  [place] / Referral to community resources:  [place]  Patient/Family's Response to care:  Agreeable and Responding well to care.   Patient/Family's Understanding of and Emotional Response to Diagnosis, Current Treatment, and  Prognosis:  Patient alert x4. Patient understands her diagnosis and follow up care at rehab. Patient son -[name] [name] very involved in patient care and has been proactive in arranging for patient to have help once she leaves rehab.   Emotional Assessment Appearance:  Appears stated age Attitude/Demeanor/Rapport:    Affect (typically observed):  Accepting, Calm Orientation:  Oriented to Self, Oriented to Place, Oriented to  Time, Oriented to Situation Alcohol / Substance use:  Not Applicable Psych involvement (Current and /or in the community):  No (Comment)  Discharge Needs  Concerns to be addressed:  Discharge Planning Concerns Readmission within the last 30 days:  No Current discharge risk:  Dependent with Mobility Barriers to Discharge:  [place], LCSW 01/16/2018, 11:40 AM

## 2018-01-16 NOTE — [place] FL2 (Addendum)
MEDICAID [id number] LEVEL OF CARE SCREENING TOOL     IDENTIFICATION  Patient Name: [name] [name] Birthdate: 09/20/1930 Sex: female Admission Date (Current Location): 01/14/2018  [place] [place]   Facility and Address:  [facility],  [address], [place] [id number]      Provider Number: [id number]  Attending Physician Name and Address:  Alwyn RenMathews, Elizabeth G, MD  Relative Name and Phone Number:       Current Level of Care: Hospital Recommended Level of Care: Skilled Nursing Facility Prior Approval Number:    Date Approved/Denied:   PASRR Number:   [id number]  Discharge Plan: SNF    Current Diagnoses: Patient Active Problem List   Diagnosis Date Noted  . Fall 01/14/2018  . PVD (peripheral vascular disease) (HCC) 12/21/2017  . Chronic bilateral low back pain without sciatica 12/21/2017  . Current mild episode of major depressive disorder (HCC) 08/01/2017  . Pancytopenia (HCC) 06/29/2017  . Depression, major, single episode, moderate (HCC) 06/29/2017  . Osteoporosis 05/25/2017  . Acquired hypothyroidism 05/25/2017  . B12 deficiency 05/25/2017  . Recurrent cystitis 05/25/2017  . Chronic back pain 05/25/2017  . GERD (gastroesophageal reflux disease) 12/22/2016  . Depression with anxiety 12/22/2016  . Chronic constipation 10/26/2016  . Compression fracture of L2 lumbar vertebra, closed, initial encounter (HCC) 10/21/2016  . Thrombocytopenia (HCC) 10/21/2016  . Hypertensive heart disease 05/02/2016  . Bilateral leg edema 04/30/2016  . Exertional dyspnea 09/29/2013  . Essential hypertension 01/29/2013  . Fatigue with decreased exercise tolerance 12/25/2012  . Chronotropic incompetence 12/24/2012  . Aortic ejection murmur 12/22/2012  . Esophagitis 05/04/2012  . GI bleed 05/03/2012  . Anemia 05/03/2012  . Postop Transfusion 04/27/2012  . OA (osteoarthritis) of knee 04/24/2012    Orientation RESPIRATION BLADDER Height & Weight      Self, Time, Situation, Place  Normal Incontinent Weight: 155 lb (70.3 kg) Height:  5\' 4"  (162.6 cm)  BEHAVIORAL SYMPTOMS/MOOD NEUROLOGICAL BOWEL NUTRITION STATUS      Continent Diet(Regular )  AMBULATORY STATUS COMMUNICATION OF NEEDS Skin   Extensive Assist Verbally                         Personal Care Assistance Level of Assistance  Bathing, Dressing, Feeding Bathing Assistance: Limited assistance Feeding assistance: Independent Dressing Assistance: Limited assistance     Functional Limitations Info  Sight, Hearing, Speech Sight Info: Adequate Hearing Info: Adequate Speech Info: Adequate    SPECIAL CARE FACTORS FREQUENCY  PT (By licensed PT), OT (By licensed OT)     PT Frequency: 5X/WEEK OT Frequency: 5X/WEEK             Contractures Contractures Info: Not present    Additional Factors Info  Code Status, Allergies, Psychotropic Code Status Info: DNR Allergies Info: Allergies: No Known Allergies           Current Medications (01/16/2018):  This is the current hospital active medication list Current Facility-Administered Medications  Medication Dose Route Frequency Provider Last Rate Last Dose  . 0.9 %  sodium chloride infusion   Intravenous Continuous Alwyn RenMathews, Elizabeth G, MD 75 mL/hr at 01/16/18 0600    . acetaminophen (TYLENOL) tablet 650 mg  650 mg Oral Q6H PRN Noralee Stainhoi, Jennifer, DO   650 mg at 01/16/18 1133   Or  . acetaminophen (TYLENOL) suppository 650 mg  650 mg Rectal Q6H PRN Noralee Stainhoi, Jennifer, DO      . ALPRAZolam Prudy Feeler(XANAX) tablet 0.25  mg  0.25 mg Oral Daily PRN Noralee Stain, DO      . amLODipine (NORVASC) tablet 10 mg  10 mg Oral Daily Noralee Stain, DO   10 mg at 01/16/18 0901  . buPROPion [facility] SR) 12 hr tablet 150 mg  150 mg Oral BID PC Noralee Stain, DO   150 mg at 01/16/18 0856  . docusate sodium (COLACE) capsule 100 mg  100 mg Oral Daily Alwyn Ren, MD   100 mg at 01/16/18 0856  . enoxaparin (LOVENOX) injection 40 mg  40 mg  Subcutaneous Q24H Noralee Stain, DO   40 mg at 01/15/18 2007  . ketorolac (TORADOL) 15 MG/ML injection 15 mg  15 mg Intravenous Q6H PRN Noralee Stain, DO   15 mg at 01/15/18 2007  . levothyroxine (SYNTHROID, LEVOTHROID) tablet 75 mcg  75 mcg Oral QAC breakfast Noralee Stain, DO   75 mcg at 01/16/18 0856  . losartan (COZAAR) tablet 100 mg  100 mg Oral Daily Noralee Stain, DO   100 mg at 01/16/18 0856  . ondansetron (ZOFRAN) tablet 4 mg  4 mg Oral Q6H PRN Noralee Stain, DO       Or  . ondansetron [facility]) injection 4 mg  4 mg Intravenous Q6H PRN Noralee Stain, DO      . oxyCODONE (Oxy IR/ROXICODONE) immediate release tablet 5 mg  5 mg Oral Q4H PRN Noralee Stain, DO      . pantoprazole (PROTONIX) EC tablet 80 mg  80 mg Oral Daily Noralee Stain, DO   80 mg at 01/16/18 0903  . senna-docusate (Senokot-S) tablet 2 tablet  2 tablet Oral QHS Noralee Stain, DO   2 tablet at 01/15/18 2007     Discharge Medications: Please see discharge summary for a list of discharge medications.  Relevant Imaging Results:  Relevant Lab Results:   Additional Information SS # [id number]  Clearance Coots, LCSW

## 2018-01-16 NOTE — Plan of Care (Signed)
  Problem: Education: Goal: Knowledge of General Education information will improve Outcome: Progressing   Problem: Activity: Goal: Risk for activity intolerance will decrease Outcome: Progressing   Problem: Nutrition: Goal: Adequate nutrition will be maintained Outcome: Progressing   Problem: Pain Managment: Goal: General experience of comfort will improve Outcome: Progressing   Problem: Safety: Goal: Ability to remain free from injury will improve Outcome: Progressing   

## 2018-01-16 NOTE — Clinical Social Work Placement (Addendum)
D/C Summary sent.  Nurse given the number to call report  [facility] to transport.   CLINICAL SOCIAL WORK PLACEMENT  NOTE  Date:  01/16/2018  Patient Details  Name: [name] [name] MRN: [id number] Date of Birth: 11/04/1930  Clinical Social Work is seeking post-discharge placement for this patient at the Skilled  Nursing Facility level of care (*CSW will initial, date and re-position this form in  chart as items are completed):  No   Patient/family provided with [facility] Clinical Social Work Department's list of facilities offering this level of care within the geographic area requested by the patient (or if unable, by the patient's family).  No   Patient/family informed of their freedom to choose among providers that offer the needed level of care, that participate in Medicare, Medicaid or managed care program needed by the patient, have an available bed and are willing to accept the patient.  No   Patient/family informed of [facility]'s ownership interest in [facility] and [facility], as well as of the fact that they are under no obligation to receive care at these facilities.  PASRR submitted to EDS on       PASRR number received on       Existing PASRR number confirmed on       FL2 transmitted to all facilities in geographic area requested by pt/family on       FL2 transmitted to all facilities within larger geographic area on       Patient informed that his/her managed care company has contracts with or will negotiate with certain facilities, including the following:  [facility]         Patient/family informed of bed offers received.  [facility]     Physician recommends and patient chooses bed at      [facility].  Patient to be transferred to facility by PTAR     Patient family notified on 01/16/18 of transfer.  Name of family member notified:  Son-[name]     PHYSICIAN Please sign  FL2, Please prepare priority discharge summary, including medications     Additional Comment:    _______________________________________________ Clearance CootsNicole A Clinton Wahlberg, LCSW 01/16/2018, 1:06 PM

## 2018-01-18 ENCOUNTER — Telehealth: Payer: Self-pay

## 2018-01-18 ENCOUNTER — Non-Acute Institutional Stay (SKILLED_NURSING_FACILITY): Payer: Medicare Other | Admitting: Internal Medicine

## 2018-01-18 ENCOUNTER — Encounter: Payer: Self-pay | Admitting: Internal Medicine

## 2018-01-18 DIAGNOSIS — T796XXS Traumatic ischemia of muscle, sequela: Secondary | ICD-10-CM | POA: Diagnosis not present

## 2018-01-18 DIAGNOSIS — D696 Thrombocytopenia, unspecified: Secondary | ICD-10-CM | POA: Diagnosis not present

## 2018-01-18 DIAGNOSIS — D508 Other iron deficiency anemias: Secondary | ICD-10-CM | POA: Diagnosis not present

## 2018-01-18 DIAGNOSIS — G8929 Other chronic pain: Secondary | ICD-10-CM

## 2018-01-18 DIAGNOSIS — K2100 Gastro-esophageal reflux disease with esophagitis, without bleeding: Secondary | ICD-10-CM

## 2018-01-18 DIAGNOSIS — R4189 Other symptoms and signs involving cognitive functions and awareness: Secondary | ICD-10-CM

## 2018-01-18 DIAGNOSIS — N3090 Cystitis, unspecified without hematuria: Secondary | ICD-10-CM | POA: Diagnosis not present

## 2018-01-18 DIAGNOSIS — F418 Other specified anxiety disorders: Secondary | ICD-10-CM | POA: Diagnosis not present

## 2018-01-18 DIAGNOSIS — M545 Low back pain, unspecified: Secondary | ICD-10-CM

## 2018-01-18 DIAGNOSIS — E039 Hypothyroidism, unspecified: Secondary | ICD-10-CM | POA: Diagnosis not present

## 2018-01-18 DIAGNOSIS — I10 Essential (primary) hypertension: Secondary | ICD-10-CM | POA: Diagnosis not present

## 2018-01-18 DIAGNOSIS — R2681 Unsteadiness on feet: Secondary | ICD-10-CM

## 2018-01-18 DIAGNOSIS — R531 Weakness: Secondary | ICD-10-CM | POA: Diagnosis not present

## 2018-01-18 NOTE — Telephone Encounter (Signed)
Possible re-admission to facility. [facility] [place] . [facility] F/[facility] is needed if patient was re-admitted to facility in SNF upon discharge. [facility]/16/2018

## 2018-01-18 NOTE — Progress Notes (Signed)
Provider:  Blanchie Serve MD  Location:  [facility] Room Number: 79 Place of Service:  SNF (31)  PCP: Blanchie Serve, MD Patient Care Team: Blanchie Serve, MD as PCP - General (Internal Medicine)  Extended Emergency Contact Information Primary Emergency Contact: Bauch,[name] Address: [place],  [address] [place] Phone: [phone] Mobile Phone: [phone] Relation: Son Secondary Emergency Contact: Castrogiovanni,[name]  [place] Phone: [phone] Relation: None  Code Status: full code  Goals of Care: Advanced Directive information Advanced Directives 01/14/2018  Does Patient Have a Medical Advance Directive? Yes  Type of Advance Directive Living will;Healthcare Power of Attorney  Does patient want to make changes to medical advance directive? No - Patient declined  Copy of [place] in Chart? No - copy requested      Chief Complaint  Patient presents with  . New Admit To SNF    New Admission Visit     HPI: Patient is a 84 y.o. female seen today for admission visit. She was in the hospital from 01/14/18-01/16/18 post fall with mild rhabdomylosis. She required iv fluids. CT head and C-spine were negative for acute fracutre or bleed and stroke. Her imaging of extremities were negative for acute fracture/ dislocation. With her deconditioning she was seen by therapy team and SNF was recommended for rehabilitation. She is seen in her room today. She has medical history of hypertension, hypothyroidism, depression, chronic back pain among others. She was residing in [place] prior to this hospitalization.   Past Medical History:  Diagnosis Date  . Anemia   . Anxiety   . Chronotropic incompetence 12/2012    potentially medication related; noted on CPET   . DDD (degenerative disc disease)   . Eczema   . GERD (gastroesophageal reflux disease)   . H/O hiatal hernia   . Hx: UTI (urinary  tract infection)   . Hyperlipidemia   . Hypertension   . Hypothyroidism   . Osteopenia   . Septicemia ([place]) 2002   following UTI  . Vertigo, benign positional    Past Surgical History:  Procedure Laterality Date  . BREAST SURGERY     left biopsy  . CATARACT EXTRACTION Right    2 weeks ago  . CPET / MET - PFTS     Consistent with chronotropic incompetence; See attached report in the results section  . DILATION AND CURETTAGE OF UTERUS     x 2  . DOPPLER ECHOCARDIOGRAPHY  01/10/2013   Normal LV size and function. Normal EF. Air sclerosis but no stenosis  . ESOPHAGOGASTRODUODENOSCOPY  05/04/2012   Procedure: ESOPHAGOGASTRODUODENOSCOPY (EGD);  Surgeon: Inda Castle, MD;  Location: [facility];  Service: Endoscopy;  Laterality: N/A;  . EYE SURGERY     cataract extraction with ILO  ? eye  . IR KYPHO EA ADDL LEVEL THORACIC OR LUMBAR  10/28/2016  . IR KYPHO LUMBAR INC FX REDUCE BONE BX UNI/BIL CANNULATION INC/IMAGING  10/28/2016  . IR KYPHO THORACIC WITH BONE BIOPSY  12/24/2016  . IR RADIOLOGIST EVAL & MGMT  11/11/2016  . KNEE ARTHROSCOPY  2011   right  . TONSILLECTOMY    . TOTAL KNEE ARTHROPLASTY  04/24/2012   Procedure: TOTAL KNEE ARTHROPLASTY;  Surgeon: Gearlean Alf, MD;  Location: [facility];  Service: Orthopedics;  Laterality: Right;    reports that she quit smoking about 52 years ago. Her smoking use  included cigarettes. She quit after 0.00 years of use. She has never used smokeless tobacco. She reports that she does not drink alcohol or use drugs. Social History   Socioeconomic History  . Marital status: Widowed    Spouse name: [name]  . Number of children: 3  . Years of education: Not on file  . Highest education level: Not on file  Occupational History  . Occupation: homemaker  Social Needs  . Financial resource strain: Not hard at all  . Food insecurity:    Worry: Never true    Inability: Never true  . Transportation needs:    Medical: No    Non-medical: No  Tobacco  Use  . Smoking status: Former Smoker    Years: 0.00    Types: Cigarettes    Last attempt to quit: 05/03/1965    Years since quitting: 52.7  . Smokeless tobacco: Never Used  Substance and Sexual Activity  . Alcohol use: No    Comment: occ glass wine  . Drug use: No  . Sexual activity: Not on file  Lifestyle  . Physical activity:    Days per week: 7 days    Minutes per session: 10 min  . Stress: To some extent  Relationships  . Social connections:    Talks on phone: More than three times a week    Gets together: More than three times a week    Attends religious service: Never    Active member of club or organization: No    Attends meetings of clubs or organizations: Never    Relationship status: Widowed  . Intimate partner violence:    Fear of current or ex partner: No    Emotionally abused: No    Physically abused: No    Forced sexual activity: No  Other Topics Concern  . Not on file  Social History Narrative   She is a widowed mother of 3.   She is a former smoker, with a distant history having quit in 1966.   She does drink an occasional glass of red wine.   She is just now started to try doing exercise with water aerobics.    Functional Status Survey:    Family History  Problem Relation Age of Onset  . Lung cancer Father 37       Died at age 85  . Stroke Mother 105       Died in her late 83s.  Marland [place] Heart attack Mother 25  . Diabetes Mother   . Arthritis Mother     Health Maintenance  Topic Date Due  . DEXA SCAN  07/30/1997  . PNA vac Low Risk Adult (2 of 2 - PCV13) 12/21/2014  . TETANUS/TDAP  09/25/2016  . INFLUENZA VACCINE  02/23/2018    No Known Allergies  Outpatient Encounter Medications as of 01/18/2018  Medication Sig  . acetaminophen (TYLENOL) 500 MG tablet Take 2 tablets (1,000 mg total) by mouth 2 (two) times daily.  Marland [place] ALPRAZolam (XANAX) 0.25 MG tablet TAKE 1 TABLET BY MOUTH EVERY DAY AS NEEDED  . amLODipine (NORVASC) 10 MG tablet Take 1 tablet  (10 mg total) by mouth daily.  Marland [place] buPROPion (WELLBUTRIN SR) 150 MG 12 hr tablet Take 1 tablet (150 mg total) by mouth 2 (two) times daily after a meal.  . calcium carbonate (OS-CAL) 600 MG TABS Take 600 mg by mouth 2 (two) times daily with a meal.  . cholecalciferol (VITAMIN D) 1000 UNITS tablet Take 1,000 Units by mouth  daily.  . Cranberry 425 MG CAPS Take 1 capsule by mouth daily.  Mariane Baumgarten Calcium (STOOL SOFTENER PO) Take 2 tablets by mouth daily.  . fish oil-omega-3 fatty acids 1000 MG capsule Take 1 g by mouth daily.  . furosemide (LASIX) 20 MG tablet Take 20 mg by mouth daily.  Marland Kitchen levothyroxine (SYNTHROID, LEVOTHROID) 75 MCG tablet Take 1 tablet (75 mcg total) by mouth daily before breakfast.  . loperamide (IMODIUM A-D) 2 MG tablet Take 2 mg by mouth as needed for diarrhea or loose stools.  Mar[place] Kitchen losartan (COZAAR) 100 MG tablet Take 1 tablet (100 mg total) by mouth daily.  . Multiple Vitamin (MULTIVITAMIN WITH MINERALS) TABS tablet Take 1 tablet by mouth daily.  . nitrofurantoin (MACRODANTIN) 50 MG capsule Take 50 mg by mouth daily.  Mar[place] Kitchen omeprazole (PRILOSEC) 40 MG capsule Take 40 mg by mouth daily.  . sennosides-docusate sodium (SENOKOT-S) 8.6-50 MG tablet Take 2 tablets by mouth at bedtime.  . traMADol (ULTRAM) 50 MG tablet Take 1 tablet (50 mg total) by mouth every 6 (six) hours as needed for up to 7 days.  . TYMLOS 3120 MCG/1.56ML SOPN INJECT 80MCG SUBCUTANEOUSLY ONCE DAILY  . zinc oxide 20 % ointment Apply 1 application topically as needed for irritation.  . [DISCONTINUED] Cranberry 12600 MG CAPS Take 12,600 mg by mouth as needed.   No facility-administered encounter medications on file as of 01/18/2018.     Review of Systems  Constitutional: Positive for fatigue. Negative for appetite change, chills and fever.  HENT: Negative for congestion, ear discharge, ear pain, hearing loss, mouth sores, postnasal drip, rhinorrhea, sinus pressure, sinus pain, sore throat and trouble swallowing.     Eyes: Negative for visual disturbance.  Respiratory: Negative for cough, shortness of breath and wheezing.   Cardiovascular: Negative for chest pain, palpitations and leg swelling.  Gastrointestinal: Negative for abdominal pain, constipation, diarrhea, nausea and vomiting.       Moved her bowel this am  Genitourinary: Negative for dysuria and frequency.  Musculoskeletal: Positive for back pain and gait problem.       Uses walker for ambulation, recent fall with hospitalization  Skin: Negative for rash.  Neurological: Negative for dizziness and headaches.  Psychiatric/Behavioral: Positive for confusion and dysphoric mood. Negative for behavioral problems and sleep disturbance.    Vitals:   01/18/18 1415  BP: (!) 158/77  Pulse: 69  Resp: 16  Temp: (!) 96.5 F (35.8 C)  TempSrc: Oral  SpO2: 99%  Weight: 162 lb (73.5 kg)  Height: _0  (1.626 m)   Body mass index is 27.81 kg/m.   Wt Readings from Last 3 Encounters:  01/18/18 162 lb (73.5 kg)  01/14/18 155 lb (70.3 kg)  12/21/17 153 lb 3.2 oz (69.5 kg)   Physical Exam  Constitutional: She is oriented to person, place, and time.  Overweight elderly female in no distress   HENT:  Head: Normocephalic and atraumatic.  Right Ear: External ear normal.  Left Ear: External ear normal.  Nose: Nose normal.  Mouth/Throat: Oropharynx is clear and moist. No oropharyngeal exudate.  Eyes: Pupils are equal, round, and reactive to light. Conjunctivae and EOM are normal. Right eye exhibits no discharge. Left eye exhibits no discharge.  Neck: Normal range of motion. Neck supple.  Cardiovascular: Normal rate and regular rhythm.  Pulmonary/Chest: Effort normal and breath sounds normal. She has no wheezes. She has no rales.  Abdominal: Soft. Bowel sounds are normal. There is no tenderness. There is no guarding.  Musculoskeletal: She exhibits no edema.  Able to move all 4 extremities, generalized weakness, no spinal tenderness, unsteady gait,  uses walker for ambulation  Lymphadenopathy:    She has no cervical adenopathy.  Neurological: She is alert and oriented to person, place, and time.  Slow with conversation  Skin: Skin is warm and dry. Capillary refill takes less than 2 seconds.  Abrasion with petechiae to right leg. brusie to right arm, right knee, left hand.   Psychiatric: She has a normal mood and affect.    Labs reviewed: Basic Metabolic Panel: Recent Labs    01/14/18 0928 01/15/18 0524 01/16/18 0356  NA 135 138 135  K 4.0 3.8 4.3  CL 100* 106 106  CO2 _0 GLUCOSE 135* 104* 103*  BUN 25* 19 21*  CREATININE 0.62 0.63 0.56  CALCIUM 9.6 8.9 8.6*   Liver Function Tests: Recent Labs    02/10/17  10/25/17 1446 12/09/17 0000 01/14/18 0928  AST 32   < > 30 29 60*  ALT 22   < > _1 ALKPHOS 106  --  99  --  85  BILITOT  --    < > 0.3 0.4 0.8  PROT  --    < > 7.4 7.8 7.9  ALBUMIN  --   --  3.9  --  4.3   < > = values in this interval not displayed.   No results for input(s): LIPASE, AMYLASE in the last 8760 hours. No results for input(s): AMMONIA in the last 8760 hours. CBC: Recent Labs    08/25/17 0845 10/25/17 1446  01/14/18 0928 01/15/18 0524 01/16/18 0356  WBC 3.4* 5.2   < > 10.2 5.7 5.1  NEUTROABS 1,992 3.1  --  7.9*  --   --   HGB 10.4* 10.7*   < > 12.3 10.9* 9.7*  HCT 31.6* 32.8*   < > 37.3 33.7* 29.8*  MCV 82.9 87.9   < > 88.0 89.6 89.5  PLT 97* 84*   < > 98* 80* 75*   < > = values in this interval not displayed.   Cardiac Enzymes: Recent Labs    01/14/18 0928 01/16/18 0356  CKTOTAL 1,707* 621*   BNP: Invalid input(s): POCBNP No results found for: HGBA1C Lab Results  Component Value Date   TSH 2.63 12/09/2017   Lab Results  Component Value Date   VITAMINB12 1,333 (H) 10/25/2017   Lab Results  Component Value Date   FOLATE 23.0 10/25/2017   Lab Results  Component Value Date   IRON 94 10/25/2017   TIBC 366 10/25/2017   FERRITIN 102 10/25/2017    Imaging  and Procedures obtained prior to SNF admission: Dg Pelvis 1-2 Views  Result Date: 01/14/2018 CLINICAL DATA:  Fall last night onto right side. EXAM: PELVIS - 1-2 VIEW COMPARISON:  None. FINDINGS: There is no evidence of pelvic fracture or diastasis. No pelvic bone lesions are seen. Mild degenerate change of the spine and hips. IMPRESSION: No acute fracture. Electronically Signed   By: Marin Olp M.D.   On: 01/14/2018 10:19   Dg Tibia/fibula Right  Result Date: 01/14/2018 CLINICAL DATA:  Fall last night onto right side with right lower leg pain. EXAM: RIGHT TIBIA AND FIBULA - 2 VIEW COMPARISON:  None. FINDINGS: Right total knee arthroplasty intact. No acute fracture or dislocation. IMPRESSION: No acute findings. Electronically Signed   By: Marin Olp M.D.   On: 01/14/2018 10:22   Ct Head  Wo Contrast  Result Date: 01/14/2018 CLINICAL DATA:  Fall last night landing on right side. EXAM: CT HEAD WITHOUT CONTRAST CT CERVICAL SPINE WITHOUT CONTRAST TECHNIQUE: Multidetector CT imaging of the head and cervical spine was performed following the standard protocol without intravenous contrast. Multiplanar CT image reconstructions of the cervical spine were also generated. COMPARISON:  None. FINDINGS: CT HEAD FINDINGS Brain: Ventricles, cisterns and other CSF spaces are within normal as there is mild age related atrophic change. Mild to moderate chronic ischemic microvascular disease is present. There is no mass, mass effect, shift of midline structures or acute hemorrhage. No evidence of acute infarction. Vascular: No hyperdense vessel or unexpected calcification. Skull: Normal. Negative for fracture or focal lesion. Sinuses/Orbits: No acute finding. Other: None. CT CERVICAL SPINE FINDINGS Alignment: 2 mm anterior subluxation of C7 on T1 which is degenerative due to moderate facet arthropathy. Skull base and vertebrae: Moderate spondylosis of the cervical spine. Vertebral body heights are maintained.  Atlantoaxial articulation is within normal. There is moderate uncovertebral joint spurring and facet arthropathy. There is neural foraminal narrowing at multiple levels due to adjacent bony spurring. There is no acute fracture. Soft tissues and spinal canal: No prevertebral fluid or swelling. No visible canal hematoma. Disc levels: Moderate disc space narrowing at the C5-6 and C6-7 levels. Upper chest: Negative. Other: None. IMPRESSION: No acute brain injury. Mild to moderate chronic ischemic microvascular disease and mild age related atrophic change. No acute cervical spine injury. Moderate spondylosis of the cervical spine with moderate disc disease at the C5-6 and C6-7 levels. Moderate bilateral neural foraminal narrowing at multiple levels due to adjacent bony spurring. Electronically Signed   By: Marin Olp M.D.   On: 01/14/2018 10:13   Ct Chest W Contrast  Result Date: 01/14/2018 CLINICAL DATA:  84 year old fell from bed.  Abdominal trauma. EXAM: CT CHEST, ABDOMEN, AND PELVIS WITH CONTRAST TECHNIQUE: Multidetector CT imaging of the chest, abdomen and pelvis was performed following the standard protocol during bolus administration of intravenous contrast. CONTRAST:  161m ISOVUE-300 IOPAMIDOL (ISOVUE-300) INJECTION 61% COMPARISON:  12/21/2016 FINDINGS: CT CHEST FINDINGS Cardiovascular: Atherosclerotic calcifications in the coronary arteries and throughout the thoracic aorta. No evidence to suggest aortic aneurysm or dissection. Main pulmonary arteries are patent. Heart size is normal without significant pericardial fluid. Mediastinum/Nodes: Small hiatal hernia. No lymph node enlargement in the chest. Lungs/Pleura: Trachea and mainstem bronchi are patent. Negative for pneumothorax. Scarring at the lung apices, right side greater than left. Scarring and/or atelectasis at both lung bases. No large pleural effusions. 5 mm pleural-based nodule at the right lung base along the right major fissure on sequence  4, image 112. This was present on the study from 12/21/2016 and likely an incidental finding. Peripheral reticular densities in the right upper lung are suggestive for chronic changes, possibly mild fibrosis in these areas. No significant airspace disease or consolidation. Musculoskeletal: Both shoulders are located. Clavicles are intact. Old compression fracture at T9 with vertebral body cement. Old compression fracture at T11 with previous vertebral augmentation. No evidence for a displaced rib fracture. CT ABDOMEN PELVIS FINDINGS Hepatobiliary: Gallbladder is mildly distended without inflammatory changes. Normal appearance of the liver. No biliary dilatation. The main portal venous system is patent. Pancreas: Pancreas is poorly characterized. However, there appear to be a tubular or cystic structure along the top of the pancreatic body or adjacent to the pancreas body on sequence 2, image 57. This structure measures greater than 2.5 cm. In retrospect, this was present on 12/21/2016  and may have been present on the noncontrast study from 05/26/2010. No acute inflammatory changes involving the pancreas. Spleen: Normal in size without focal abnormality. Adrenals/Urinary Tract: Adrenal glands are unremarkable. Normal appearance of both kidneys without hydronephrosis. No suspicious renal lesions. Urinary bladder is markedly distended extending into the lower abdomen. Stomach/Bowel: Small hiatal hernia. No evidence for bowel obstruction or focal bowel inflammation. Vascular/Lymphatic: Aorta and visceral arteries are heavily calcified without aneurysm. No lymph node enlargement in the abdomen or pelvis. Reproductive: Uterus and adnexal structures are unremarkable. Other: There is soft tissue in the presacral space on sequence 2, image 100. This area measures roughly 2.9 x 1.8 x 3.2 cm. Similar finding was present on the CT from 10/24/2016 and there may have also been some soft tissue in this area dating back to  05/26/2010. This is likely a benign etiology. There are low-density areas within this soft tissues suggesting there may be a fat or lipoma component. No evidence for adjacent bone destruction. Negative for ascites. Negative for free air. Musculoskeletal: Old compression fractures with vertebral body cement at L1 and L2. Disc space narrowing at L4-L5. No evidence for a new vertebral body fracture. IMPRESSION: No acute abnormalities within the chest, abdomen or pelvis. Urinary bladder distension without hydronephrosis. Indeterminate soft tissue in the presacral space measuring up to 3.2 cm. This has been present for multiple years although there has probably been interval growth. Favor a benign etiology. There is low-density material within this structure and this could represent a benign myelolipoma. **An incidental finding of potential clinical significance has been found. Probable cystic or tubular structure along the superior aspect of the pancreatic body. This is poorly characterized on this examination but suspect this has been stable for at least 1 year if not longer. Consider further characterization of this area with MRI in 6 months.** Old compression fractures and previous vertebral body augmentation procedures. No evidence for an acute vertebral body compression fracture. Chronic changes and scarring in lungs particularly in the right upper lung and apex regions. Electronically Signed   By: Markus Daft M.D.   On: 01/14/2018 14:04   Ct Cervical Spine Wo Contrast  Result Date: 01/14/2018 CLINICAL DATA:  Fall last night landing on right side. EXAM: CT HEAD WITHOUT CONTRAST CT CERVICAL SPINE WITHOUT CONTRAST TECHNIQUE: Multidetector CT imaging of the head and cervical spine was performed following the standard protocol without intravenous contrast. Multiplanar CT image reconstructions of the cervical spine were also generated. COMPARISON:  None. FINDINGS: CT HEAD FINDINGS Brain: Ventricles, cisterns and  other CSF spaces are within normal as there is mild age related atrophic change. Mild to moderate chronic ischemic microvascular disease is present. There is no mass, mass effect, shift of midline structures or acute hemorrhage. No evidence of acute infarction. Vascular: No hyperdense vessel or unexpected calcification. Skull: Normal. Negative for fracture or focal lesion. Sinuses/Orbits: No acute finding. Other: None. CT CERVICAL SPINE FINDINGS Alignment: 2 mm anterior subluxation of C7 on T1 which is degenerative due to moderate facet arthropathy. Skull base and vertebrae: Moderate spondylosis of the cervical spine. Vertebral body heights are maintained. Atlantoaxial articulation is within normal. There is moderate uncovertebral joint spurring and facet arthropathy. There is neural foraminal narrowing at multiple levels due to adjacent bony spurring. There is no acute fracture. Soft tissues and spinal canal: No prevertebral fluid or swelling. No visible canal hematoma. Disc levels: Moderate disc space narrowing at the C5-6 and C6-7 levels. Upper chest: Negative. Other: None. IMPRESSION: No acute  brain injury. Mild to moderate chronic ischemic microvascular disease and mild age related atrophic change. No acute cervical spine injury. Moderate spondylosis of the cervical spine with moderate disc disease at the C5-6 and C6-7 levels. Moderate bilateral neural foraminal narrowing at multiple levels due to adjacent bony spurring. Electronically Signed   By: Marin Olp M.D.   On: 01/14/2018 10:13   Ct Abdomen Pelvis W Contrast  Result Date: 01/14/2018 CLINICAL DATA:  84 year old fell from bed.  Abdominal trauma. EXAM: CT CHEST, ABDOMEN, AND PELVIS WITH CONTRAST TECHNIQUE: Multidetector CT imaging of the chest, abdomen and pelvis was performed following the standard protocol during bolus administration of intravenous contrast. CONTRAST:  143m ISOVUE-300 IOPAMIDOL (ISOVUE-300) INJECTION 61% COMPARISON:  12/21/2016  FINDINGS: CT CHEST FINDINGS Cardiovascular: Atherosclerotic calcifications in the coronary arteries and throughout the thoracic aorta. No evidence to suggest aortic aneurysm or dissection. Main pulmonary arteries are patent. Heart size is normal without significant pericardial fluid. Mediastinum/Nodes: Small hiatal hernia. No lymph node enlargement in the chest. Lungs/Pleura: Trachea and mainstem bronchi are patent. Negative for pneumothorax. Scarring at the lung apices, right side greater than left. Scarring and/or atelectasis at both lung bases. No large pleural effusions. 5 mm pleural-based nodule at the right lung base along the right major fissure on sequence 4, image 112. This was present on the study from 12/21/2016 and likely an incidental finding. Peripheral reticular densities in the right upper lung are suggestive for chronic changes, possibly mild fibrosis in these areas. No significant airspace disease or consolidation. Musculoskeletal: Both shoulders are located. Clavicles are intact. Old compression fracture at T9 with vertebral body cement. Old compression fracture at T11 with previous vertebral augmentation. No evidence for a displaced rib fracture. CT ABDOMEN PELVIS FINDINGS Hepatobiliary: Gallbladder is mildly distended without inflammatory changes. Normal appearance of the liver. No biliary dilatation. The main portal venous system is patent. Pancreas: Pancreas is poorly characterized. However, there appear to be a tubular or cystic structure along the top of the pancreatic body or adjacent to the pancreas body on sequence 2, image 57. This structure measures greater than 2.5 cm. In retrospect, this was present on 12/21/2016 and may have been present on the noncontrast study from 05/26/2010. No acute inflammatory changes involving the pancreas. Spleen: Normal in size without focal abnormality. Adrenals/Urinary Tract: Adrenal glands are unremarkable. Normal appearance of both kidneys without  hydronephrosis. No suspicious renal lesions. Urinary bladder is markedly distended extending into the lower abdomen. Stomach/Bowel: Small hiatal hernia. No evidence for bowel obstruction or focal bowel inflammation. Vascular/Lymphatic: Aorta and visceral arteries are heavily calcified without aneurysm. No lymph node enlargement in the abdomen or pelvis. Reproductive: Uterus and adnexal structures are unremarkable. Other: There is soft tissue in the presacral space on sequence 2, image 100. This area measures roughly 2.9 x 1.8 x 3.2 cm. Similar finding was present on the CT from 10/24/2016 and there may have also been some soft tissue in this area dating back to 05/26/2010. This is likely a benign etiology. There are low-density areas within this soft tissues suggesting there may be a fat or lipoma component. No evidence for adjacent bone destruction. Negative for ascites. Negative for free air. Musculoskeletal: Old compression fractures with vertebral body cement at L1 and L2. Disc space narrowing at L4-L5. No evidence for a new vertebral body fracture. IMPRESSION: No acute abnormalities within the chest, abdomen or pelvis. Urinary bladder distension without hydronephrosis. Indeterminate soft tissue in the presacral space measuring up to 3.2 cm. This has  been present for multiple years although there has probably been interval growth. Favor a benign etiology. There is low-density material within this structure and this could represent a benign myelolipoma. **An incidental finding of potential clinical significance has been found. Probable cystic or tubular structure along the superior aspect of the pancreatic body. This is poorly characterized on this examination but suspect this has been stable for at least 1 year if not longer. Consider further characterization of this area with MRI in 6 months.** Old compression fractures and previous vertebral body augmentation procedures. No evidence for an acute vertebral body  compression fracture. Chronic changes and scarring in lungs particularly in the right upper lung and apex regions. Electronically Signed   By: Markus Daft M.D.   On: 01/14/2018 14:04   Dg Chest Port 1 View  Result Date: 01/14/2018 CLINICAL DATA:  Fall last night onto right side. EXAM: PORTABLE CHEST 1 VIEW COMPARISON:  10/01/2017 FINDINGS: Lungs are adequately inflated and otherwise clear. Cardiomediastinal silhouette is within normal. No acute fracture. Stable thoracic spine compression fractures. IMPRESSION: No acute findings. Electronically Signed   By: Marin Olp M.D.   On: 01/14/2018 10:20   Dg Femur 1v Right  Result Date: 01/14/2018 CLINICAL DATA:  Fall last night with right-sided pain. EXAM: RIGHT FEMUR 1 VIEW COMPARISON:  None. FINDINGS: Mild degenerate change of the right hip. Right total knee arthroplasty intact and normally located. No evidence of acute fracture or dislocation. IMPRESSION: No acute findings. Electronically Signed   By: Marin Olp M.D.   On: 01/14/2018 10:23    Assessment/Plan  1. Generalized weakness Will have her work with physical therapy and occupational therapy team to help with gait training and muscle strengthening exercises.fall precautions. Skin care. Encourage to be out of bed.   2. Unsteady gait Will have patient work with PT/OT as tolerated to regain strength and restore function.  Fall precautions are in place.  3. Traumatic rhabdomyolysis, sequela Post fall, s/p iv fluids with resolution. Fall precautions, gait training and strengthening with safe transfer  4. Essential hypertension Continue furosemide 20 mg daily, amlodipine 10 mg daily and losartan 100 mg daily, check bmp  5. Gastroesophageal reflux disease with esophagitis Controlled. Continue omeprazole  6. Acquired hypothyroidism Continue levothyroxine 75 mcg daily Lab Results  Component Value Date   TSH 2.63 12/09/2017    7. Other iron deficiency anemia Check cbc  8.  Thrombocytopenia (HCC) Check cbc   9. Chronic bilateral low back pain without sciatica Back precautions, assist with transfer if needed. Continue tylenol 1000 mg bid with tramadol 50 mg q6h prn pain  10. Depression with anxiety Continue wellbutrin 150 mg bid and monitor mood.   11. Recurrent cystitis Continue nitrofurantoin, monitor clinically, maintain hydration and perineal hygiene. Continue cranberry supplement  12. Cognitive impairment MMSE 30/30 IN 08/2017. Has slow processing, noted to be forgetful during conversation about recent events. SLP consult.    Family/ staff Communication: reviewed care plan with patient and charge nurse.   Labs/tests ordered: cbc, cmp 01/23/18  Blanchie Serve, MD Internal Medicine [facility] [address] [place], [place] [address] Cell Phone (Monday-Friday 8 am - 5 pm): [phone] On Call: [phone] and follow prompts after 5 pm and on weekends Office Phone: [phone] Office Fax: [phone]

## 2018-01-23 ENCOUNTER — Other Ambulatory Visit: Payer: Self-pay | Admitting: Internal Medicine

## 2018-01-23 DIAGNOSIS — E039 Hypothyroidism, unspecified: Secondary | ICD-10-CM | POA: Diagnosis not present

## 2018-01-23 DIAGNOSIS — R600 Localized edema: Secondary | ICD-10-CM | POA: Diagnosis not present

## 2018-01-23 DIAGNOSIS — S32020S Wedge compression fracture of second lumbar vertebra, sequela: Secondary | ICD-10-CM | POA: Diagnosis not present

## 2018-01-23 DIAGNOSIS — D649 Anemia, unspecified: Secondary | ICD-10-CM | POA: Diagnosis not present

## 2018-01-23 DIAGNOSIS — R29898 Other symptoms and signs involving the musculoskeletal system: Secondary | ICD-10-CM | POA: Diagnosis not present

## 2018-01-23 DIAGNOSIS — K219 Gastro-esophageal reflux disease without esophagitis: Secondary | ICD-10-CM | POA: Diagnosis not present

## 2018-01-23 DIAGNOSIS — N3090 Cystitis, unspecified without hematuria: Secondary | ICD-10-CM | POA: Diagnosis not present

## 2018-01-23 DIAGNOSIS — R0609 Other forms of dyspnea: Secondary | ICD-10-CM | POA: Diagnosis not present

## 2018-01-23 DIAGNOSIS — M6281 Muscle weakness (generalized): Secondary | ICD-10-CM | POA: Diagnosis not present

## 2018-01-23 DIAGNOSIS — K862 Cyst of pancreas: Secondary | ICD-10-CM | POA: Diagnosis not present

## 2018-01-23 DIAGNOSIS — Z9181 History of falling: Secondary | ICD-10-CM | POA: Diagnosis not present

## 2018-01-23 DIAGNOSIS — Z5189 Encounter for other specified aftercare: Secondary | ICD-10-CM | POA: Diagnosis not present

## 2018-01-23 DIAGNOSIS — I351 Nonrheumatic aortic (valve) insufficiency: Secondary | ICD-10-CM | POA: Diagnosis not present

## 2018-01-23 DIAGNOSIS — I110 Hypertensive heart disease with heart failure: Secondary | ICD-10-CM | POA: Diagnosis not present

## 2018-01-23 DIAGNOSIS — K922 Gastrointestinal hemorrhage, unspecified: Secondary | ICD-10-CM | POA: Diagnosis not present

## 2018-01-23 DIAGNOSIS — K209 Esophagitis, unspecified: Secondary | ICD-10-CM | POA: Diagnosis not present

## 2018-01-23 DIAGNOSIS — M1710 Unilateral primary osteoarthritis, unspecified knee: Secondary | ICD-10-CM | POA: Diagnosis not present

## 2018-01-23 DIAGNOSIS — R5383 Other fatigue: Secondary | ICD-10-CM | POA: Diagnosis not present

## 2018-01-23 DIAGNOSIS — I4589 Other specified conduction disorders: Secondary | ICD-10-CM | POA: Diagnosis not present

## 2018-01-23 DIAGNOSIS — I739 Peripheral vascular disease, unspecified: Secondary | ICD-10-CM | POA: Diagnosis not present

## 2018-01-23 DIAGNOSIS — K210 Gastro-esophageal reflux disease with esophagitis: Secondary | ICD-10-CM | POA: Diagnosis not present

## 2018-01-23 DIAGNOSIS — E871 Hypo-osmolality and hyponatremia: Secondary | ICD-10-CM | POA: Diagnosis not present

## 2018-01-23 DIAGNOSIS — D696 Thrombocytopenia, unspecified: Secondary | ICD-10-CM | POA: Diagnosis not present

## 2018-01-23 DIAGNOSIS — I10 Essential (primary) hypertension: Secondary | ICD-10-CM | POA: Diagnosis not present

## 2018-01-23 DIAGNOSIS — M8580 Other specified disorders of bone density and structure, unspecified site: Secondary | ICD-10-CM | POA: Diagnosis not present

## 2018-01-23 DIAGNOSIS — R2681 Unsteadiness on feet: Secondary | ICD-10-CM | POA: Diagnosis not present

## 2018-01-23 DIAGNOSIS — I119 Hypertensive heart disease without heart failure: Secondary | ICD-10-CM | POA: Diagnosis not present

## 2018-01-23 DIAGNOSIS — M545 Low back pain: Secondary | ICD-10-CM | POA: Diagnosis not present

## 2018-01-23 LAB — CBC AND DIFFERENTIAL
HCT: 30 — AB (ref 36–46)
HEMOGLOBIN: 10.1 — AB (ref 12.0–16.0)
Platelets: 106 — AB (ref 150–399)
WBC: 4.3

## 2018-01-23 LAB — BASIC METABOLIC PANEL
BUN: 24 — AB (ref 4–21)
Creatinine: 0.7 (ref 0.5–1.1)
GLUCOSE: 88
POTASSIUM: 4.3 (ref 3.4–5.3)
Sodium: 134 — AB (ref 137–147)

## 2018-01-23 LAB — HEPATIC FUNCTION PANEL
ALT: 21 (ref 7–35)
AST: 24 (ref 13–35)
Alkaline Phosphatase: 84 (ref 25–125)
BILIRUBIN, TOTAL: 0.3

## 2018-01-25 ENCOUNTER — Non-Acute Institutional Stay (SKILLED_NURSING_FACILITY): Payer: Medicare Other | Admitting: Internal Medicine

## 2018-01-25 ENCOUNTER — Encounter: Payer: Self-pay | Admitting: Internal Medicine

## 2018-01-25 DIAGNOSIS — E871 Hypo-osmolality and hyponatremia: Secondary | ICD-10-CM

## 2018-01-25 DIAGNOSIS — I110 Hypertensive heart disease with heart failure: Secondary | ICD-10-CM | POA: Diagnosis not present

## 2018-01-25 DIAGNOSIS — M545 Low back pain, unspecified: Secondary | ICD-10-CM

## 2018-01-25 DIAGNOSIS — E039 Hypothyroidism, unspecified: Secondary | ICD-10-CM | POA: Diagnosis not present

## 2018-01-25 DIAGNOSIS — K2100 Gastro-esophageal reflux disease with esophagitis, without bleeding: Secondary | ICD-10-CM

## 2018-01-25 DIAGNOSIS — N3090 Cystitis, unspecified without hematuria: Secondary | ICD-10-CM

## 2018-01-25 DIAGNOSIS — F320 Major depressive disorder, single episode, mild: Secondary | ICD-10-CM | POA: Diagnosis not present

## 2018-01-25 DIAGNOSIS — I5032 Chronic diastolic (congestive) heart failure: Secondary | ICD-10-CM | POA: Diagnosis not present

## 2018-01-25 DIAGNOSIS — I739 Peripheral vascular disease, unspecified: Secondary | ICD-10-CM

## 2018-01-25 DIAGNOSIS — G8929 Other chronic pain: Secondary | ICD-10-CM | POA: Diagnosis not present

## 2018-01-25 LAB — CMP 10231
ALBUMIN: 3.9
CHLORIDE: 97
CO2: 30
Calcium: 9.6
GFR CALC NON AF AMER: 74
Globulin: 2.7
Total Protein: 6.6

## 2018-01-25 MED ORDER — CALCIUM CARBONATE 600 MG PO TABS: 600.0000 mg | ORAL_TABLET | Freq: Two times a day (BID) | ORAL | 3 refills | Status: DC

## 2018-01-25 MED ORDER — LOSARTAN POTASSIUM 100 MG PO TABS: 100.0000 mg | ORAL_TABLET | Freq: Every day | ORAL | 0 refills | Status: DC

## 2018-01-25 MED ORDER — BUPROPION HCL ER (SR) 150 MG PO TB12: 150.0000 mg | ORAL_TABLET | Freq: Two times a day (BID) | ORAL | 3 refills | Status: DC

## 2018-01-25 MED ORDER — FUROSEMIDE 20 MG PO TABS: 20.0000 mg | ORAL_TABLET | Freq: Two times a day (BID) | ORAL | 3 refills | Status: DC

## 2018-01-25 MED ORDER — NITROFURANTOIN MACROCRYSTAL 50 MG PO CAPS: 50.0000 mg | ORAL_CAPSULE | Freq: Every day | ORAL | 3 refills | Status: DC

## 2018-01-25 MED ORDER — POTASSIUM CHLORIDE CRYS ER 20 MEQ PO TBCR: 20.0000 meq | EXTENDED_RELEASE_TABLET | Freq: Every day | ORAL | 3 refills | Status: DC

## 2018-01-25 MED ORDER — AMLODIPINE BESYLATE 10 MG PO TABS: 10.0000 mg | ORAL_TABLET | Freq: Every day | ORAL | Status: DC

## 2018-01-25 MED ORDER — OMEPRAZOLE 40 MG PO CPDR: 40.0000 mg | DELAYED_RELEASE_CAPSULE | Freq: Every day | ORAL | 3 refills | Status: DC

## 2018-01-25 MED ORDER — SENNA-DOCUSATE SODIUM 8.6-50 MG PO TABS
2.0000 | ORAL_TABLET | Freq: Every day | ORAL | 3 refills | Status: DC
Start: 2018-01-25 — End: 2018-10-18

## 2018-01-25 MED ORDER — LEVOTHYROXINE SODIUM 75 MCG PO TABS
75.0000 ug | ORAL_TABLET | Freq: Every day | ORAL | 0 refills | Status: DC
Start: 2018-01-25 — End: 2018-03-21

## 2018-01-25 NOTE — Progress Notes (Signed)
Location:  [facility] Room Number: 28 Place of Service:  SNF [phone])  Provider: Blanchie Serve MD  PCP: Blanchie Serve, MD Patient Care Team: Blanchie Serve, MD as PCP - General (Internal Medicine)  Extended Emergency Contact Information Primary Emergency Contact: Trivedi,[name] Address: [place], [place] [address] [place] Phone: [phone] Mobile Phone: [phone] Relation: Son Secondary Emergency Contact: Elman,[name]  [place] Phone: [phone] Relation: None  Code Status: DNR  Goals of care:  Advanced Directive information Advanced Directives 01/25/2018  Does Patient Have a Medical Advance Directive? Yes  Type of Advance Directive Out of facility DNR (pink MOST or yellow form)  Does patient want to make changes to medical advance directive? No - Patient declined  Copy of [place] in Chart? -  Pre-existing out of facility DNR order (yellow form or pink MOST form) Yellow form placed in chart (order not valid for inpatient use)     No Known Allergies  Chief Complaint  Patient presents with  . Discharge Note    Discharge visit     HPI:  84 y.o. female patient is seen today for discharge visit. She has been in SNF after hospital admission from 01/14/18-01/16/18 post fall with rhabdomylosis. She is seen in her room today. She has been working with PT and OT services since her admission to SNF. She would now like to return to her apartment and continue with therapy services there. She has son and daughter in law who are involved in her care, son is going to be with her in the apartment over the weekend and she will also have part time care givers come in for few hours in the morning to help with bathing and dressing. At present she is requiring assistance with bathing and dressing. Se gets short of breath with minimal exertion. Per nursing report, she is noted to have gained 8 lbs weight  since admission to the facility. She does have history of chronic diastolic CHF and last echocardiogram from 2014 is normal. She has medical history of hypertension, hypothyroidism, PVD, depression, chronic back pain and GERD among others.     Past Medical History:  Diagnosis Date  . Anemia   . Anxiety   . Chronotropic incompetence 12/2012    potentially medication related; noted on CPET   . DDD (degenerative disc disease)   . Eczema   . GERD (gastroesophageal reflux disease)   . H/O hiatal hernia   . Hx: UTI (urinary tract infection)   . Hyperlipidemia   . Hypertension   . Hypothyroidism   . Osteopenia   . Septicemia ([place]) 2002   following UTI  . Vertigo, benign positional     Past Surgical History:  Procedure Laterality Date  . BREAST SURGERY     left biopsy  . CATARACT EXTRACTION Right    2 weeks ago  . CPET / MET - PFTS     Consistent with chronotropic incompetence; See attached report in the results section  . DILATION AND CURETTAGE OF UTERUS     x 2  . DOPPLER ECHOCARDIOGRAPHY  01/10/2013   Normal LV size and function. Normal EF. Air sclerosis but no stenosis  . ESOPHAGOGASTRODUODENOSCOPY  05/04/2012   Procedure: ESOPHAGOGASTRODUODENOSCOPY (EGD);  Surgeon: Inda Castle, MD;  Location: [facility];  Service: Endoscopy;  Laterality: N/A;  . EYE SURGERY     cataract extraction with  ILO  ? eye  . IR KYPHO EA ADDL LEVEL THORACIC OR LUMBAR  10/28/2016  . IR KYPHO LUMBAR INC FX REDUCE BONE BX UNI/BIL CANNULATION INC/IMAGING  10/28/2016  . IR KYPHO THORACIC WITH BONE BIOPSY  12/24/2016  . IR RADIOLOGIST EVAL & MGMT  11/11/2016  . KNEE ARTHROSCOPY  2011   right  . TONSILLECTOMY    . TOTAL KNEE ARTHROPLASTY  04/24/2012   Procedure: TOTAL KNEE ARTHROPLASTY;  Surgeon: Gearlean Alf, MD;  Location: [facility];  Service: Orthopedics;  Laterality: Right;      reports that she quit smoking about 52 years ago. Her smoking use included cigarettes. She quit after 0.00 years of use. She  has never used smokeless tobacco. She reports that she does not drink alcohol or use drugs. Social History   Socioeconomic History  . Marital status: Widowed    Spouse name: [name]  . Number of children: 3  . Years of education: Not on file  . Highest education level: Not on file  Occupational History  . Occupation: homemaker  Social Needs  . Financial resource strain: Not hard at all  . Food insecurity:    Worry: Never true    Inability: Never true  . Transportation needs:    Medical: No    Non-medical: No  Tobacco Use  . Smoking status: Former Smoker    Years: 0.00    Types: Cigarettes    Last attempt to quit: 05/03/1965    Years since quitting: 52.7  . Smokeless tobacco: Never Used  Substance and Sexual Activity  . Alcohol use: No    Comment: occ glass wine  . Drug use: No  . Sexual activity: Not on file  Lifestyle  . Physical activity:    Days per week: 7 days    Minutes per session: 10 min  . Stress: To some extent  Relationships  . Social connections:    Talks on phone: More than three times a week    Gets together: More than three times a week    Attends religious service: Never    Active member of club or organization: No    Attends meetings of clubs or organizations: Never    Relationship status: Widowed  . Intimate partner violence:    Fear of current or ex partner: No    Emotionally abused: No    Physically abused: No    Forced sexual activity: No  Other Topics Concern  . Not on file  Social History Narrative   She is a widowed mother of 3.   She is a former smoker, with a distant history having quit in 1966.   She does drink an occasional glass of red wine.   She is just now started to try doing exercise with water aerobics.   Functional Status Survey:    No Known Allergies  Pertinent  Health Maintenance Due  Topic Date Due  . DEXA SCAN  07/30/1997  . PNA vac Low Risk Adult (2 of 2 - PCV13) 12/21/2014  . INFLUENZA VACCINE  02/23/2018     Medications: Outpatient Encounter Medications as of 01/25/2018  Medication Sig  . acetaminophen (TYLENOL) 500 MG tablet Take 2 tablets (1,000 mg total) by mouth 2 (two) times daily.  Marland Kitchen ALPRAZolam (XANAX) 0.25 MG tablet TAKE 1 TABLET BY MOUTH EVERY DAY AS NEEDED  . amLODipine (NORVASC) 10 MG tablet Take 1 tablet (10 mg total) by mouth daily.  Marland Kitchen buPROPion (WELLBUTRIN SR) 150 MG 12 hr  tablet Take 1 tablet (150 mg total) by mouth 2 (two) times daily after a meal.  . calcium carbonate (OS-CAL) 600 MG TABS Take 600 mg by mouth 2 (two) times daily with a meal.  . cholecalciferol (VITAMIN D) 1000 UNITS tablet Take 1,000 Units by mouth daily.  . fish oil-omega-3 fatty acids 1000 MG capsule Take 1 g by mouth daily.  . furosemide (LASIX) 20 MG tablet Take 20 mg by mouth daily.  Marland Kitchen levothyroxine (SYNTHROID, LEVOTHROID) 75 MCG tablet Take 1 tablet (75 mcg total) by mouth daily before breakfast.  . loperamide (IMODIUM A-D) 2 MG tablet Take 2 mg by mouth as needed for diarrhea or loose stools.  Marland Kitchen losartan (COZAAR) 100 MG tablet Take 1 tablet (100 mg total) by mouth daily.  . Multiple Vitamin (MULTIVITAMIN WITH MINERALS) TABS tablet Take 1 tablet by mouth daily.  . nitrofurantoin (MACRODANTIN) 50 MG capsule Take 50 mg by mouth daily.  . NON FORMULARY Take 12,600 mg by mouth daily as needed (Take 1 capsule). Triple Strength Cranberry  . omeprazole (PRILOSEC) 40 MG capsule Take 40 mg by mouth daily.  . sennosides-docusate sodium (SENOKOT-S) 8.6-50 MG tablet Take 2 tablets by mouth at bedtime.  . TYMLOS 3120 MCG/1.56ML SOPN INJECT 80MCG SUBCUTANEOUSLY ONCE DAILY  . zinc oxide 20 % ointment Apply 1 application topically as needed for irritation.  . [DISCONTINUED] Cranberry 425 MG CAPS Take 1 capsule by mouth daily.  . [DISCONTINUED] Docusate Calcium (STOOL SOFTENER PO) Take 2 tablets by mouth daily.   No facility-administered encounter medications on file as of 01/25/2018.     Review of Systems   Constitutional: Negative for appetite change, chills and fever.  HENT: Negative for congestion, mouth sores, postnasal drip, sore throat and trouble swallowing.   Respiratory: Positive for shortness of breath. Negative for cough, chest tightness and wheezing.   Cardiovascular: Positive for leg swelling. Negative for chest pain and palpitations.  Gastrointestinal: Negative for abdominal pain, constipation, diarrhea, nausea and vomiting.  Genitourinary: Negative for dysuria, pelvic pain and urgency.  Neurological: Negative for dizziness and headaches.  Hematological: Bruises/bleeds easily.  Psychiatric/Behavioral: Positive for decreased concentration and dysphoric mood. Negative for behavioral problems, hallucinations and sleep disturbance.    Vitals:   01/25/18 1054  BP: (!) 160/73  Pulse: 66  Resp: 20  Temp: (!) 96.6 F (35.9 C)  TempSrc: Oral  SpO2: 95%  Weight: 170 lb (77.1 kg)  Height: '5\' 4"'  (1.626 m)   Body mass index is 29.18 kg/m.   Wt Readings from Last 3 Encounters:  01/25/18 170 lb (77.1 kg)  01/18/18 162 lb (73.5 kg)  01/14/18 155 lb (70.3 kg)   Physical Exam  Constitutional: She is oriented to person, place, and time.  Overweight elderly female in no acute distress  HENT:  Head: Normocephalic and atraumatic.  Mouth/Throat: Oropharynx is clear and moist. No oropharyngeal exudate.  Eyes: Pupils are equal, round, and reactive to light. Conjunctivae and EOM are normal. Right eye exhibits no discharge. Left eye exhibits no discharge.  Neck: Normal range of motion. Neck supple.  Cardiovascular: Normal rate and regular rhythm.  Pulmonary/Chest: Effort normal. No respiratory distress. She has no wheezes. She exhibits no tenderness.  Poor air movement, crackles to lung bases  Abdominal: Soft. Bowel sounds are normal. There is no tenderness. There is no guarding.  Musculoskeletal: She exhibits edema.  1+ pitting leg edema, able to move all 4 extremities, unsteady gait,  needs 1 person minimum assistance to help with transfer  and ADLs like bathing and dressing  Lymphadenopathy:    She has no cervical adenopathy.  Neurological: She is alert and oriented to person, place, and time. She exhibits normal muscle tone.  Skin: Skin is warm and dry. She is not diaphoretic. No erythema.  Resolving bruise to hands and arm, resolving carpet burn to right leg.   Psychiatric: She has a normal mood and affect.    Labs reviewed: Basic Metabolic Panel: Recent Labs    01/14/18 0928 01/15/18 0524 01/16/18 0356 01/23/18  NA 135 138 135 134*  K 4.0 3.8 4.3 4.3  CL 100* 106 106 97  CO2 '26 26 24 30  ' GLUCOSE 135* 104* 103*  --   BUN 25* 19 21* 24*  CREATININE 0.62 0.63 0.56 0.7  CALCIUM 9.6 8.9 8.6* 9.6   Liver Function Tests: Recent Labs    10/25/17 1446 12/09/17 0000 01/14/18 0928 01/23/18  AST 30 29 60* 24  ALT '22 17 26 21  ' ALKPHOS 99  --  85 84  BILITOT 0.3 0.4 0.8  --   PROT 7.4 7.8 7.9 6.6  ALBUMIN 3.9  --  4.3 3.9   No results for input(s): LIPASE, AMYLASE in the last 8760 hours. No results for input(s): AMMONIA in the last 8760 hours. CBC: Recent Labs    08/25/17 0845 10/25/17 1446  01/14/18 0928 01/15/18 0524 01/16/18 0356 01/23/18  WBC 3.4* 5.2   < > 10.2 5.7 5.1 4.3  NEUTROABS 1,992 3.1  --  7.9*  --   --   --   HGB 10.4* 10.7*   < > 12.3 10.9* 9.7* 10.1*  HCT 31.6* 32.8*   < > 37.3 33.7* 29.8* 30*  MCV 82.9 87.9   < > 88.0 89.6 89.5  --   PLT 97* 84*   < > 98* 80* 75* 106*   < > = values in this interval not displayed.   Cardiac Enzymes: Recent Labs    01/14/18 0928 01/16/18 0356  CKTOTAL 1,707* 621*   BNP: Invalid input(s): POCBNP CBG: No results for input(s): GLUCAP in the last 8760 hours.   Lab Results  Component Value Date   TSH 2.63 12/09/2017    Procedures and Imaging Studies During Stay: Dg Pelvis 1-2 Views  Result Date: 01/14/2018 CLINICAL DATA:  Fall last night onto right side. EXAM: PELVIS - 1-2 VIEW  COMPARISON:  None. FINDINGS: There is no evidence of pelvic fracture or diastasis. No pelvic bone lesions are seen. Mild degenerate change of the spine and hips. IMPRESSION: No acute fracture. Electronically Signed   By: Marin Olp M.D.   On: 01/14/2018 10:19   Dg Tibia/fibula Right  Result Date: 01/14/2018 CLINICAL DATA:  Fall last night onto right side with right lower leg pain. EXAM: RIGHT TIBIA AND FIBULA - 2 VIEW COMPARISON:  None. FINDINGS: Right total knee arthroplasty intact. No acute fracture or dislocation. IMPRESSION: No acute findings. Electronically Signed   By: Marin Olp M.D.   On: 01/14/2018 10:22   Ct Head Wo Contrast  Result Date: 01/14/2018 CLINICAL DATA:  Fall last night landing on right side. EXAM: CT HEAD WITHOUT CONTRAST CT CERVICAL SPINE WITHOUT CONTRAST TECHNIQUE: Multidetector CT imaging of the head and cervical spine was performed following the standard protocol without intravenous contrast. Multiplanar CT image reconstructions of the cervical spine were also generated. COMPARISON:  None. FINDINGS: CT HEAD FINDINGS Brain: Ventricles, cisterns and other CSF spaces are within normal as there is mild age related atrophic  change. Mild to moderate chronic ischemic microvascular disease is present. There is no mass, mass effect, shift of midline structures or acute hemorrhage. No evidence of acute infarction. Vascular: No hyperdense vessel or unexpected calcification. Skull: Normal. Negative for fracture or focal lesion. Sinuses/Orbits: No acute finding. Other: None. CT CERVICAL SPINE FINDINGS Alignment: 2 mm anterior subluxation of C7 on T1 which is degenerative due to moderate facet arthropathy. Skull base and vertebrae: Moderate spondylosis of the cervical spine. Vertebral body heights are maintained. Atlantoaxial articulation is within normal. There is moderate uncovertebral joint spurring and facet arthropathy. There is neural foraminal narrowing at multiple levels due to  adjacent bony spurring. There is no acute fracture. Soft tissues and spinal canal: No prevertebral fluid or swelling. No visible canal hematoma. Disc levels: Moderate disc space narrowing at the C5-6 and C6-7 levels. Upper chest: Negative. Other: None. IMPRESSION: No acute brain injury. Mild to moderate chronic ischemic microvascular disease and mild age related atrophic change. No acute cervical spine injury. Moderate spondylosis of the cervical spine with moderate disc disease at the C5-6 and C6-7 levels. Moderate bilateral neural foraminal narrowing at multiple levels due to adjacent bony spurring. Electronically Signed   By: Marin Olp M.D.   On: 01/14/2018 10:13   Ct Chest W Contrast  Result Date: 01/14/2018 CLINICAL DATA:  84 year old fell from bed.  Abdominal trauma. EXAM: CT CHEST, ABDOMEN, AND PELVIS WITH CONTRAST TECHNIQUE: Multidetector CT imaging of the chest, abdomen and pelvis was performed following the standard protocol during bolus administration of intravenous contrast. CONTRAST:  159m ISOVUE-300 IOPAMIDOL (ISOVUE-300) INJECTION 61% COMPARISON:  12/21/2016 FINDINGS: CT CHEST FINDINGS Cardiovascular: Atherosclerotic calcifications in the coronary arteries and throughout the thoracic aorta. No evidence to suggest aortic aneurysm or dissection. Main pulmonary arteries are patent. Heart size is normal without significant pericardial fluid. Mediastinum/Nodes: Small hiatal hernia. No lymph node enlargement in the chest. Lungs/Pleura: Trachea and mainstem bronchi are patent. Negative for pneumothorax. Scarring at the lung apices, right side greater than left. Scarring and/or atelectasis at both lung bases. No large pleural effusions. 5 mm pleural-based nodule at the right lung base along the right major fissure on sequence 4, image 112. This was present on the study from 12/21/2016 and likely an incidental finding. Peripheral reticular densities in the right upper lung are suggestive for chronic  changes, possibly mild fibrosis in these areas. No significant airspace disease or consolidation. Musculoskeletal: Both shoulders are located. Clavicles are intact. Old compression fracture at T9 with vertebral body cement. Old compression fracture at T11 with previous vertebral augmentation. No evidence for a displaced rib fracture. CT ABDOMEN PELVIS FINDINGS Hepatobiliary: Gallbladder is mildly distended without inflammatory changes. Normal appearance of the liver. No biliary dilatation. The main portal venous system is patent. Pancreas: Pancreas is poorly characterized. However, there appear to be a tubular or cystic structure along the top of the pancreatic body or adjacent to the pancreas body on sequence 2, image 57. This structure measures greater than 2.5 cm. In retrospect, this was present on 12/21/2016 and may have been present on the noncontrast study from 05/26/2010. No acute inflammatory changes involving the pancreas. Spleen: Normal in size without focal abnormality. Adrenals/Urinary Tract: Adrenal glands are unremarkable. Normal appearance of both kidneys without hydronephrosis. No suspicious renal lesions. Urinary bladder is markedly distended extending into the lower abdomen. Stomach/Bowel: Small hiatal hernia. No evidence for bowel obstruction or focal bowel inflammation. Vascular/Lymphatic: Aorta and visceral arteries are heavily calcified without aneurysm. No lymph node enlargement in  the abdomen or pelvis. Reproductive: Uterus and adnexal structures are unremarkable. Other: There is soft tissue in the presacral space on sequence 2, image 100. This area measures roughly 2.9 x 1.8 x 3.2 cm. Similar finding was present on the CT from 10/24/2016 and there may have also been some soft tissue in this area dating back to 05/26/2010. This is likely a benign etiology. There are low-density areas within this soft tissues suggesting there may be a fat or lipoma component. No evidence for adjacent bone  destruction. Negative for ascites. Negative for free air. Musculoskeletal: Old compression fractures with vertebral body cement at L1 and L2. Disc space narrowing at L4-L5. No evidence for a new vertebral body fracture. IMPRESSION: No acute abnormalities within the chest, abdomen or pelvis. Urinary bladder distension without hydronephrosis. Indeterminate soft tissue in the presacral space measuring up to 3.2 cm. This has been present for multiple years although there has probably been interval growth. Favor a benign etiology. There is low-density material within this structure and this could represent a benign myelolipoma. **An incidental finding of potential clinical significance has been found. Probable cystic or tubular structure along the superior aspect of the pancreatic body. This is poorly characterized on this examination but suspect this has been stable for at least 1 year if not longer. Consider further characterization of this area with MRI in 6 months.** Old compression fractures and previous vertebral body augmentation procedures. No evidence for an acute vertebral body compression fracture. Chronic changes and scarring in lungs particularly in the right upper lung and apex regions. Electronically Signed   By: Markus Daft M.D.   On: 01/14/2018 14:04   Ct Cervical Spine Wo Contrast  Result Date: 01/14/2018 CLINICAL DATA:  Fall last night landing on right side. EXAM: CT HEAD WITHOUT CONTRAST CT CERVICAL SPINE WITHOUT CONTRAST TECHNIQUE: Multidetector CT imaging of the head and cervical spine was performed following the standard protocol without intravenous contrast. Multiplanar CT image reconstructions of the cervical spine were also generated. COMPARISON:  None. FINDINGS: CT HEAD FINDINGS Brain: Ventricles, cisterns and other CSF spaces are within normal as there is mild age related atrophic change. Mild to moderate chronic ischemic microvascular disease is present. There is no mass, mass effect,  shift of midline structures or acute hemorrhage. No evidence of acute infarction. Vascular: No hyperdense vessel or unexpected calcification. Skull: Normal. Negative for fracture or focal lesion. Sinuses/Orbits: No acute finding. Other: None. CT CERVICAL SPINE FINDINGS Alignment: 2 mm anterior subluxation of C7 on T1 which is degenerative due to moderate facet arthropathy. Skull base and vertebrae: Moderate spondylosis of the cervical spine. Vertebral body heights are maintained. Atlantoaxial articulation is within normal. There is moderate uncovertebral joint spurring and facet arthropathy. There is neural foraminal narrowing at multiple levels due to adjacent bony spurring. There is no acute fracture. Soft tissues and spinal canal: No prevertebral fluid or swelling. No visible canal hematoma. Disc levels: Moderate disc space narrowing at the C5-6 and C6-7 levels. Upper chest: Negative. Other: None. IMPRESSION: No acute brain injury. Mild to moderate chronic ischemic microvascular disease and mild age related atrophic change. No acute cervical spine injury. Moderate spondylosis of the cervical spine with moderate disc disease at the C5-6 and C6-7 levels. Moderate bilateral neural foraminal narrowing at multiple levels due to adjacent bony spurring. Electronically Signed   By: Marin Olp M.D.   On: 01/14/2018 10:13   Ct Abdomen Pelvis W Contrast  Result Date: 01/14/2018 CLINICAL DATA:  84 year old fell from  bed.  Abdominal trauma. EXAM: CT CHEST, ABDOMEN, AND PELVIS WITH CONTRAST TECHNIQUE: Multidetector CT imaging of the chest, abdomen and pelvis was performed following the standard protocol during bolus administration of intravenous contrast. CONTRAST:  117m ISOVUE-300 IOPAMIDOL (ISOVUE-300) INJECTION 61% COMPARISON:  12/21/2016 FINDINGS: CT CHEST FINDINGS Cardiovascular: Atherosclerotic calcifications in the coronary arteries and throughout the thoracic aorta. No evidence to suggest aortic aneurysm or  dissection. Main pulmonary arteries are patent. Heart size is normal without significant pericardial fluid. Mediastinum/Nodes: Small hiatal hernia. No lymph node enlargement in the chest. Lungs/Pleura: Trachea and mainstem bronchi are patent. Negative for pneumothorax. Scarring at the lung apices, right side greater than left. Scarring and/or atelectasis at both lung bases. No large pleural effusions. 5 mm pleural-based nodule at the right lung base along the right major fissure on sequence 4, image 112. This was present on the study from 12/21/2016 and likely an incidental finding. Peripheral reticular densities in the right upper lung are suggestive for chronic changes, possibly mild fibrosis in these areas. No significant airspace disease or consolidation. Musculoskeletal: Both shoulders are located. Clavicles are intact. Old compression fracture at T9 with vertebral body cement. Old compression fracture at T11 with previous vertebral augmentation. No evidence for a displaced rib fracture. CT ABDOMEN PELVIS FINDINGS Hepatobiliary: Gallbladder is mildly distended without inflammatory changes. Normal appearance of the liver. No biliary dilatation. The main portal venous system is patent. Pancreas: Pancreas is poorly characterized. However, there appear to be a tubular or cystic structure along the top of the pancreatic body or adjacent to the pancreas body on sequence 2, image 57. This structure measures greater than 2.5 cm. In retrospect, this was present on 12/21/2016 and may have been present on the noncontrast study from 05/26/2010. No acute inflammatory changes involving the pancreas. Spleen: Normal in size without focal abnormality. Adrenals/Urinary Tract: Adrenal glands are unremarkable. Normal appearance of both kidneys without hydronephrosis. No suspicious renal lesions. Urinary bladder is markedly distended extending into the lower abdomen. Stomach/Bowel: Small hiatal hernia. No evidence for bowel  obstruction or focal bowel inflammation. Vascular/Lymphatic: Aorta and visceral arteries are heavily calcified without aneurysm. No lymph node enlargement in the abdomen or pelvis. Reproductive: Uterus and adnexal structures are unremarkable. Other: There is soft tissue in the presacral space on sequence 2, image 100. This area measures roughly 2.9 x 1.8 x 3.2 cm. Similar finding was present on the CT from 10/24/2016 and there may have also been some soft tissue in this area dating back to 05/26/2010. This is likely a benign etiology. There are low-density areas within this soft tissues suggesting there may be a fat or lipoma component. No evidence for adjacent bone destruction. Negative for ascites. Negative for free air. Musculoskeletal: Old compression fractures with vertebral body cement at L1 and L2. Disc space narrowing at L4-L5. No evidence for a new vertebral body fracture. IMPRESSION: No acute abnormalities within the chest, abdomen or pelvis. Urinary bladder distension without hydronephrosis. Indeterminate soft tissue in the presacral space measuring up to 3.2 cm. This has been present for multiple years although there has probably been interval growth. Favor a benign etiology. There is low-density material within this structure and this could represent a benign myelolipoma. **An incidental finding of potential clinical significance has been found. Probable cystic or tubular structure along the superior aspect of the pancreatic body. This is poorly characterized on this examination but suspect this has been stable for at least 1 year if not longer. Consider further characterization of this area  with MRI in 6 months.** Old compression fractures and previous vertebral body augmentation procedures. No evidence for an acute vertebral body compression fracture. Chronic changes and scarring in lungs particularly in the right upper lung and apex regions. Electronically Signed   By: Markus Daft M.D.   On:  01/14/2018 14:04   Dg Chest Port 1 View  Result Date: 01/14/2018 CLINICAL DATA:  Fall last night onto right side. EXAM: PORTABLE CHEST 1 VIEW COMPARISON:  10/01/2017 FINDINGS: Lungs are adequately inflated and otherwise clear. Cardiomediastinal silhouette is within normal. No acute fracture. Stable thoracic spine compression fractures. IMPRESSION: No acute findings. Electronically Signed   By: Marin Olp M.D.   On: 01/14/2018 10:20   Dg Femur 1v Right  Result Date: 01/14/2018 CLINICAL DATA:  Fall last night with right-sided pain. EXAM: RIGHT FEMUR 1 VIEW COMPARISON:  None. FINDINGS: Mild degenerate change of the right hip. Right total knee arthroplasty intact and normally located. No evidence of acute fracture or dislocation. IMPRESSION: No acute findings. Electronically Signed   By: Marin Olp M.D.   On: 01/14/2018 10:23    Assessment/Plan:    1. Hypertensive heart disease with chronic diastolic congestive heart failure ([place]) Advise weight check M/W/F at home - CMP with eGFR([facility]); Future - CBC (no diff); Future - furosemide (LASIX) 20 MG tablet; Take 1 tablet (20 mg total) by mouth 2 (two) times daily.  Dispense: 180 tablet; Refill: 3 - losartan (COZAAR) 100 MG tablet; Take 1 tablet (100 mg total) by mouth daily.  Dispense: 90 tablet; Refill: 0 - amLODipine (NORVASC) 10 MG tablet; Take 1 tablet (10 mg total) by mouth daily. - potassium chloride SA (K-DUR,KLOR-CON) 20 MEQ tablet; Take 1 tablet (20 mEq total) by mouth daily.  Dispense: 30 tablet; Refill: 3  2. PVD (peripheral vascular disease) ([place]) Weight gain with increased leg edema. Increase lasix to 20 mg bid and add kcl. [facility] to [facility] to her apartment on discharge.  - furosemide (LASIX) 20 MG tablet; Take 1 tablet (20 mg total) by mouth 2 (two) times daily.  Dispense: 180 tablet; Refill: 3 - potassium chloride SA (K-DUR,KLOR-CON) 20 MEQ tablet; Take 1 tablet (20 mEq total) by mouth daily.   Dispense: 30 tablet; Refill: 3  3. Gastroesophageal reflux disease with esophagitis - omeprazole (PRILOSEC) 40 MG capsule; Take 1 capsule (40 mg total) by mouth daily.  Dispense: 90 capsule; Refill: 3  4. Acquired hypothyroidism - sennosides-docusate sodium (SENOKOT-S) 8.6-50 MG tablet; Take 2 tablets by mouth at bedtime.  Dispense: 90 tablet; Refill: 3 - levothyroxine (SYNTHROID, LEVOTHROID) 75 MCG tablet; Take 1 tablet (75 mcg total) by mouth daily before breakfast.  Dispense: 30 tablet; Refill: 0  5. Recurrent cystitis Continue nitrofurantoin - nitrofurantoin (MACRODANTIN) 50 MG capsule; Take 1 capsule (50 mg total) by mouth daily.  Dispense: 90 capsule; Refill: 3  6. Current mild episode of major depressive disorder, unspecified whether recurrent ([facility]) - buPROPion (WELLBUTRIN SR) 150 MG 12 hr tablet; Take 1 tablet (150 mg total) by mouth 2 (two) times daily after a meal.  Dispense: 60 tablet; Refill: 3  7. Chronic bilateral low back pain without sciatica - calcium carbonate (OS-CAL) 600 MG TABS tablet; Take 1 tablet (600 mg total) by mouth 2 (two) times daily with a meal.  Dispense: 180 tablet; Refill: 3 Continue tylenol  8. Hyponatremia - CMP with eGFR([facility]); Future - CBC (no diff); Future   Patient is being discharged with the following home  health services:  PT, OT to help with gait training, strengthening and safe transfer/ mobility. SLP to help with cognitive decline.   Patient is being discharged with the following durable medical equipment: none. She has a rolling walker with brake and seat.    Patient has been advised to f/u with me/ her PCP on 02/08/18 at 9:30 am with lab work on 02/06/18 at 8 am. Patient was provided with adequate prescriptions of noncontrolled medications to reach the scheduled appointment .    Future labs/tests needed:  cmp  Family/ staff Communication: reviewed care plan with patient and charge nurse.  Blanchie Serve, MD Internal Medicine [facility] [address] [place], [place] [address] Cell Phone (Monday-Friday 8 am - 5 pm): [phone] On Call: [phone] and follow prompts after 5 pm and on weekends Office Phone: [phone] Office Fax: [phone]

## 2018-01-29 DIAGNOSIS — Z8719 Personal history of other diseases of the digestive system: Secondary | ICD-10-CM | POA: Diagnosis not present

## 2018-01-29 DIAGNOSIS — M6281 Muscle weakness (generalized): Secondary | ICD-10-CM | POA: Diagnosis not present

## 2018-01-29 DIAGNOSIS — M545 Low back pain: Secondary | ICD-10-CM | POA: Diagnosis not present

## 2018-01-29 DIAGNOSIS — Z23 Encounter for immunization: Secondary | ICD-10-CM | POA: Diagnosis not present

## 2018-01-29 DIAGNOSIS — R0600 Dyspnea, unspecified: Secondary | ICD-10-CM | POA: Diagnosis not present

## 2018-01-29 DIAGNOSIS — W19XXXA Unspecified fall, initial encounter: Secondary | ICD-10-CM | POA: Diagnosis not present

## 2018-01-29 DIAGNOSIS — R269 Unspecified abnormalities of gait and mobility: Secondary | ICD-10-CM | POA: Diagnosis not present

## 2018-01-29 DIAGNOSIS — R2681 Unsteadiness on feet: Secondary | ICD-10-CM | POA: Diagnosis not present

## 2018-01-29 DIAGNOSIS — M8580 Other specified disorders of bone density and structure, unspecified site: Secondary | ICD-10-CM | POA: Diagnosis not present

## 2018-01-29 DIAGNOSIS — R5381 Other malaise: Secondary | ICD-10-CM | POA: Diagnosis not present

## 2018-01-29 DIAGNOSIS — M810 Age-related osteoporosis without current pathological fracture: Secondary | ICD-10-CM | POA: Diagnosis not present

## 2018-01-29 DIAGNOSIS — N390 Urinary tract infection, site not specified: Secondary | ICD-10-CM | POA: Diagnosis not present

## 2018-01-29 DIAGNOSIS — R0781 Pleurodynia: Secondary | ICD-10-CM | POA: Diagnosis not present

## 2018-01-29 DIAGNOSIS — M5380 Other specified dorsopathies, site unspecified: Secondary | ICD-10-CM | POA: Diagnosis not present

## 2018-01-29 DIAGNOSIS — M1990 Unspecified osteoarthritis, unspecified site: Secondary | ICD-10-CM | POA: Diagnosis not present

## 2018-01-29 DIAGNOSIS — Z0000 Encounter for general adult medical examination without abnormal findings: Secondary | ICD-10-CM | POA: Diagnosis not present

## 2018-01-29 DIAGNOSIS — Z008 Encounter for other general examination: Secondary | ICD-10-CM | POA: Diagnosis not present

## 2018-01-29 DIAGNOSIS — Z1389 Encounter for screening for other disorder: Secondary | ICD-10-CM | POA: Diagnosis not present

## 2018-01-29 DIAGNOSIS — M546 Pain in thoracic spine: Secondary | ICD-10-CM | POA: Diagnosis not present

## 2018-01-29 DIAGNOSIS — Z9181 History of falling: Secondary | ICD-10-CM | POA: Diagnosis not present

## 2018-01-30 DIAGNOSIS — M545 Low back pain: Secondary | ICD-10-CM | POA: Diagnosis not present

## 2018-01-30 DIAGNOSIS — M546 Pain in thoracic spine: Secondary | ICD-10-CM | POA: Diagnosis not present

## 2018-01-30 DIAGNOSIS — W19XXXA Unspecified fall, initial encounter: Secondary | ICD-10-CM | POA: Diagnosis not present

## 2018-01-30 DIAGNOSIS — M6281 Muscle weakness (generalized): Secondary | ICD-10-CM | POA: Diagnosis not present

## 2018-01-30 DIAGNOSIS — R2681 Unsteadiness on feet: Secondary | ICD-10-CM | POA: Diagnosis not present

## 2018-01-30 DIAGNOSIS — N390 Urinary tract infection, site not specified: Secondary | ICD-10-CM | POA: Diagnosis not present

## 2018-01-30 DIAGNOSIS — M1990 Unspecified osteoarthritis, unspecified site: Secondary | ICD-10-CM | POA: Diagnosis not present

## 2018-01-30 DIAGNOSIS — R5381 Other malaise: Secondary | ICD-10-CM | POA: Diagnosis not present

## 2018-01-30 DIAGNOSIS — Z23 Encounter for immunization: Secondary | ICD-10-CM | POA: Diagnosis not present

## 2018-01-30 DIAGNOSIS — Z1389 Encounter for screening for other disorder: Secondary | ICD-10-CM | POA: Diagnosis not present

## 2018-01-30 DIAGNOSIS — Z0000 Encounter for general adult medical examination without abnormal findings: Secondary | ICD-10-CM | POA: Diagnosis not present

## 2018-01-30 DIAGNOSIS — M8580 Other specified disorders of bone density and structure, unspecified site: Secondary | ICD-10-CM | POA: Diagnosis not present

## 2018-01-30 DIAGNOSIS — Z9181 History of falling: Secondary | ICD-10-CM | POA: Diagnosis not present

## 2018-01-30 DIAGNOSIS — R269 Unspecified abnormalities of gait and mobility: Secondary | ICD-10-CM | POA: Diagnosis not present

## 2018-01-30 DIAGNOSIS — M810 Age-related osteoporosis without current pathological fracture: Secondary | ICD-10-CM | POA: Diagnosis not present

## 2018-01-30 DIAGNOSIS — R0600 Dyspnea, unspecified: Secondary | ICD-10-CM | POA: Diagnosis not present

## 2018-01-30 DIAGNOSIS — R0781 Pleurodynia: Secondary | ICD-10-CM | POA: Diagnosis not present

## 2018-01-30 DIAGNOSIS — Z008 Encounter for other general examination: Secondary | ICD-10-CM | POA: Diagnosis not present

## 2018-01-30 DIAGNOSIS — M5380 Other specified dorsopathies, site unspecified: Secondary | ICD-10-CM | POA: Diagnosis not present

## 2018-01-30 DIAGNOSIS — Z8719 Personal history of other diseases of the digestive system: Secondary | ICD-10-CM | POA: Diagnosis not present

## 2018-01-30 NOTE — Addendum Note (Signed)
Addended byOneal Grout: Jla Reynolds on: 01/30/2018 11:17 AM   Modules accepted: Level of Service

## 2018-01-31 DIAGNOSIS — R5381 Other malaise: Secondary | ICD-10-CM | POA: Diagnosis not present

## 2018-01-31 DIAGNOSIS — W19XXXA Unspecified fall, initial encounter: Secondary | ICD-10-CM | POA: Diagnosis not present

## 2018-01-31 DIAGNOSIS — R0600 Dyspnea, unspecified: Secondary | ICD-10-CM | POA: Diagnosis not present

## 2018-01-31 DIAGNOSIS — Z9181 History of falling: Secondary | ICD-10-CM | POA: Diagnosis not present

## 2018-01-31 DIAGNOSIS — Z0000 Encounter for general adult medical examination without abnormal findings: Secondary | ICD-10-CM | POA: Diagnosis not present

## 2018-01-31 DIAGNOSIS — R0781 Pleurodynia: Secondary | ICD-10-CM | POA: Diagnosis not present

## 2018-01-31 DIAGNOSIS — Z8719 Personal history of other diseases of the digestive system: Secondary | ICD-10-CM | POA: Diagnosis not present

## 2018-01-31 DIAGNOSIS — Z008 Encounter for other general examination: Secondary | ICD-10-CM | POA: Diagnosis not present

## 2018-01-31 DIAGNOSIS — Z1389 Encounter for screening for other disorder: Secondary | ICD-10-CM | POA: Diagnosis not present

## 2018-01-31 DIAGNOSIS — N390 Urinary tract infection, site not specified: Secondary | ICD-10-CM | POA: Diagnosis not present

## 2018-01-31 DIAGNOSIS — M546 Pain in thoracic spine: Secondary | ICD-10-CM | POA: Diagnosis not present

## 2018-01-31 DIAGNOSIS — Z23 Encounter for immunization: Secondary | ICD-10-CM | POA: Diagnosis not present

## 2018-01-31 DIAGNOSIS — M6281 Muscle weakness (generalized): Secondary | ICD-10-CM | POA: Diagnosis not present

## 2018-01-31 DIAGNOSIS — M545 Low back pain: Secondary | ICD-10-CM | POA: Diagnosis not present

## 2018-01-31 DIAGNOSIS — R2681 Unsteadiness on feet: Secondary | ICD-10-CM | POA: Diagnosis not present

## 2018-01-31 DIAGNOSIS — M1990 Unspecified osteoarthritis, unspecified site: Secondary | ICD-10-CM | POA: Diagnosis not present

## 2018-01-31 DIAGNOSIS — R269 Unspecified abnormalities of gait and mobility: Secondary | ICD-10-CM | POA: Diagnosis not present

## 2018-01-31 DIAGNOSIS — M8580 Other specified disorders of bone density and structure, unspecified site: Secondary | ICD-10-CM | POA: Diagnosis not present

## 2018-01-31 DIAGNOSIS — M810 Age-related osteoporosis without current pathological fracture: Secondary | ICD-10-CM | POA: Diagnosis not present

## 2018-01-31 DIAGNOSIS — M5380 Other specified dorsopathies, site unspecified: Secondary | ICD-10-CM | POA: Diagnosis not present

## 2018-02-03 DIAGNOSIS — W19XXXA Unspecified fall, initial encounter: Secondary | ICD-10-CM | POA: Diagnosis not present

## 2018-02-03 DIAGNOSIS — M6281 Muscle weakness (generalized): Secondary | ICD-10-CM | POA: Diagnosis not present

## 2018-02-03 DIAGNOSIS — M5380 Other specified dorsopathies, site unspecified: Secondary | ICD-10-CM | POA: Diagnosis not present

## 2018-02-03 DIAGNOSIS — Z008 Encounter for other general examination: Secondary | ICD-10-CM | POA: Diagnosis not present

## 2018-02-03 DIAGNOSIS — Z8719 Personal history of other diseases of the digestive system: Secondary | ICD-10-CM | POA: Diagnosis not present

## 2018-02-03 DIAGNOSIS — Z0000 Encounter for general adult medical examination without abnormal findings: Secondary | ICD-10-CM | POA: Diagnosis not present

## 2018-02-03 DIAGNOSIS — Z1389 Encounter for screening for other disorder: Secondary | ICD-10-CM | POA: Diagnosis not present

## 2018-02-03 DIAGNOSIS — N390 Urinary tract infection, site not specified: Secondary | ICD-10-CM | POA: Diagnosis not present

## 2018-02-03 DIAGNOSIS — R269 Unspecified abnormalities of gait and mobility: Secondary | ICD-10-CM | POA: Diagnosis not present

## 2018-02-03 DIAGNOSIS — R0781 Pleurodynia: Secondary | ICD-10-CM | POA: Diagnosis not present

## 2018-02-03 DIAGNOSIS — Z23 Encounter for immunization: Secondary | ICD-10-CM | POA: Diagnosis not present

## 2018-02-03 DIAGNOSIS — R0600 Dyspnea, unspecified: Secondary | ICD-10-CM | POA: Diagnosis not present

## 2018-02-03 DIAGNOSIS — M8580 Other specified disorders of bone density and structure, unspecified site: Secondary | ICD-10-CM | POA: Diagnosis not present

## 2018-02-03 DIAGNOSIS — M546 Pain in thoracic spine: Secondary | ICD-10-CM | POA: Diagnosis not present

## 2018-02-03 DIAGNOSIS — Z9181 History of falling: Secondary | ICD-10-CM | POA: Diagnosis not present

## 2018-02-03 DIAGNOSIS — R5381 Other malaise: Secondary | ICD-10-CM | POA: Diagnosis not present

## 2018-02-03 DIAGNOSIS — M1990 Unspecified osteoarthritis, unspecified site: Secondary | ICD-10-CM | POA: Diagnosis not present

## 2018-02-03 DIAGNOSIS — M810 Age-related osteoporosis without current pathological fracture: Secondary | ICD-10-CM | POA: Diagnosis not present

## 2018-02-03 DIAGNOSIS — R2681 Unsteadiness on feet: Secondary | ICD-10-CM | POA: Diagnosis not present

## 2018-02-03 DIAGNOSIS — M545 Low back pain: Secondary | ICD-10-CM | POA: Diagnosis not present

## 2018-02-06 DIAGNOSIS — I110 Hypertensive heart disease with heart failure: Secondary | ICD-10-CM | POA: Diagnosis not present

## 2018-02-06 DIAGNOSIS — I5032 Chronic diastolic (congestive) heart failure: Principal | ICD-10-CM

## 2018-02-06 DIAGNOSIS — E871 Hypo-osmolality and hyponatremia: Secondary | ICD-10-CM

## 2018-02-06 LAB — COMPLETE METABOLIC PANEL WITH GFR
AG RATIO: 1.4 (calc) (ref 1.0–2.5)
ALKALINE PHOSPHATASE (APISO): 97 U/L (ref 33–130)
ALT: 14 U/L (ref 6–29)
AST: 21 U/L (ref 10–35)
Albumin: 4.3 g/dL (ref 3.6–5.1)
BUN/Creatinine Ratio: 24 (calc) — ABNORMAL HIGH (ref 6–22)
BUN: 22 mg/dL (ref 7–25)
CO2: 28 mmol/L (ref 20–32)
Calcium: 10.1 mg/dL (ref 8.6–10.4)
Chloride: 95 mmol/L — ABNORMAL LOW (ref 98–110)
Creat: 0.9 mg/dL — ABNORMAL HIGH (ref 0.60–0.88)
GFR, Est African American: 68 mL/min/{1.73_m2} (ref >=60)
GFR, Est Non African American: 58 mL/min/{1.73_m2} — ABNORMAL LOW (ref >=60)
GLOBULIN: 3.1 g/dL (ref 1.9–3.7)
Glucose, Bld: 98 mg/dL (ref 65–99)
POTASSIUM: 3.8 mmol/L (ref 3.5–5.3)
SODIUM: 134 mmol/L — AB (ref 135–146)
Total Bilirubin: 0.4 mg/dL (ref 0.2–1.2)
Total Protein: 7.4 g/dL (ref 6.1–8.1)

## 2018-02-06 LAB — CBC
HEMATOCRIT: 32 % — AB (ref 35.0–45.0)
Hemoglobin: 10.9 g/dL — ABNORMAL LOW (ref 11.7–15.5)
MCH: 29.2 pg (ref 27.0–33.0)
MCHC: 34.1 g/dL (ref 32.0–36.0)
MCV: 85.8 fL (ref 80.0–100.0)
MPV: 13.4 fL — ABNORMAL HIGH (ref 7.5–12.5)
PLATELETS: 77 10*3/uL — AB (ref 140–400)
RBC: 3.73 10*6/uL — ABNORMAL LOW (ref 3.80–5.10)
RDW: 15.1 % — AB (ref 11.0–15.0)
WBC: 4.5 10*3/uL (ref 3.8–10.8)

## 2018-02-07 DIAGNOSIS — Z1389 Encounter for screening for other disorder: Secondary | ICD-10-CM | POA: Diagnosis not present

## 2018-02-07 DIAGNOSIS — R2681 Unsteadiness on feet: Secondary | ICD-10-CM | POA: Diagnosis not present

## 2018-02-07 DIAGNOSIS — N390 Urinary tract infection, site not specified: Secondary | ICD-10-CM | POA: Diagnosis not present

## 2018-02-07 DIAGNOSIS — M5380 Other specified dorsopathies, site unspecified: Secondary | ICD-10-CM | POA: Diagnosis not present

## 2018-02-07 DIAGNOSIS — Z0000 Encounter for general adult medical examination without abnormal findings: Secondary | ICD-10-CM | POA: Diagnosis not present

## 2018-02-07 DIAGNOSIS — Z9181 History of falling: Secondary | ICD-10-CM | POA: Diagnosis not present

## 2018-02-07 DIAGNOSIS — M546 Pain in thoracic spine: Secondary | ICD-10-CM | POA: Diagnosis not present

## 2018-02-07 DIAGNOSIS — R5381 Other malaise: Secondary | ICD-10-CM | POA: Diagnosis not present

## 2018-02-07 DIAGNOSIS — R0600 Dyspnea, unspecified: Secondary | ICD-10-CM | POA: Diagnosis not present

## 2018-02-07 DIAGNOSIS — Z8719 Personal history of other diseases of the digestive system: Secondary | ICD-10-CM | POA: Diagnosis not present

## 2018-02-07 DIAGNOSIS — M6281 Muscle weakness (generalized): Secondary | ICD-10-CM | POA: Diagnosis not present

## 2018-02-07 DIAGNOSIS — Z008 Encounter for other general examination: Secondary | ICD-10-CM | POA: Diagnosis not present

## 2018-02-07 DIAGNOSIS — R269 Unspecified abnormalities of gait and mobility: Secondary | ICD-10-CM | POA: Diagnosis not present

## 2018-02-07 DIAGNOSIS — M545 Low back pain: Secondary | ICD-10-CM | POA: Diagnosis not present

## 2018-02-07 DIAGNOSIS — Z23 Encounter for immunization: Secondary | ICD-10-CM | POA: Diagnosis not present

## 2018-02-07 DIAGNOSIS — R0781 Pleurodynia: Secondary | ICD-10-CM | POA: Diagnosis not present

## 2018-02-07 DIAGNOSIS — M810 Age-related osteoporosis without current pathological fracture: Secondary | ICD-10-CM | POA: Diagnosis not present

## 2018-02-07 DIAGNOSIS — W19XXXA Unspecified fall, initial encounter: Secondary | ICD-10-CM | POA: Diagnosis not present

## 2018-02-07 DIAGNOSIS — M1990 Unspecified osteoarthritis, unspecified site: Secondary | ICD-10-CM | POA: Diagnosis not present

## 2018-02-07 DIAGNOSIS — M8580 Other specified disorders of bone density and structure, unspecified site: Secondary | ICD-10-CM | POA: Diagnosis not present

## 2018-02-08 ENCOUNTER — Non-Acute Institutional Stay: Payer: Medicare Other | Admitting: Internal Medicine

## 2018-02-08 ENCOUNTER — Encounter: Payer: Self-pay | Admitting: Internal Medicine

## 2018-02-08 VITALS — BP 152/70 | HR 71 | Temp 97.6°F | Resp 18 | Ht 64.0 in | Wt 168.4 lb

## 2018-02-08 DIAGNOSIS — R2681 Unsteadiness on feet: Secondary | ICD-10-CM | POA: Diagnosis not present

## 2018-02-08 DIAGNOSIS — M810 Age-related osteoporosis without current pathological fracture: Secondary | ICD-10-CM | POA: Diagnosis not present

## 2018-02-08 DIAGNOSIS — Z8719 Personal history of other diseases of the digestive system: Secondary | ICD-10-CM | POA: Diagnosis not present

## 2018-02-08 DIAGNOSIS — N390 Urinary tract infection, site not specified: Secondary | ICD-10-CM | POA: Diagnosis not present

## 2018-02-08 DIAGNOSIS — M545 Low back pain, unspecified: Secondary | ICD-10-CM

## 2018-02-08 DIAGNOSIS — M5380 Other specified dorsopathies, site unspecified: Secondary | ICD-10-CM | POA: Diagnosis not present

## 2018-02-08 DIAGNOSIS — I10 Essential (primary) hypertension: Secondary | ICD-10-CM

## 2018-02-08 DIAGNOSIS — M549 Dorsalgia, unspecified: Secondary | ICD-10-CM

## 2018-02-08 DIAGNOSIS — Z0000 Encounter for general adult medical examination without abnormal findings: Secondary | ICD-10-CM | POA: Diagnosis not present

## 2018-02-08 DIAGNOSIS — Z008 Encounter for other general examination: Secondary | ICD-10-CM | POA: Diagnosis not present

## 2018-02-08 DIAGNOSIS — Z1389 Encounter for screening for other disorder: Secondary | ICD-10-CM | POA: Diagnosis not present

## 2018-02-08 DIAGNOSIS — M8580 Other specified disorders of bone density and structure, unspecified site: Secondary | ICD-10-CM | POA: Diagnosis not present

## 2018-02-08 DIAGNOSIS — M6281 Muscle weakness (generalized): Secondary | ICD-10-CM | POA: Diagnosis not present

## 2018-02-08 DIAGNOSIS — R0781 Pleurodynia: Secondary | ICD-10-CM | POA: Diagnosis not present

## 2018-02-08 DIAGNOSIS — R269 Unspecified abnormalities of gait and mobility: Secondary | ICD-10-CM | POA: Diagnosis not present

## 2018-02-08 DIAGNOSIS — D638 Anemia in other chronic diseases classified elsewhere: Secondary | ICD-10-CM | POA: Diagnosis not present

## 2018-02-08 DIAGNOSIS — Z9181 History of falling: Secondary | ICD-10-CM | POA: Diagnosis not present

## 2018-02-08 DIAGNOSIS — I739 Peripheral vascular disease, unspecified: Secondary | ICD-10-CM | POA: Diagnosis not present

## 2018-02-08 DIAGNOSIS — G8929 Other chronic pain: Secondary | ICD-10-CM

## 2018-02-08 DIAGNOSIS — I5032 Chronic diastolic (congestive) heart failure: Secondary | ICD-10-CM

## 2018-02-08 DIAGNOSIS — I110 Hypertensive heart disease with heart failure: Secondary | ICD-10-CM | POA: Diagnosis not present

## 2018-02-08 DIAGNOSIS — M1990 Unspecified osteoarthritis, unspecified site: Secondary | ICD-10-CM | POA: Diagnosis not present

## 2018-02-08 DIAGNOSIS — M546 Pain in thoracic spine: Secondary | ICD-10-CM | POA: Diagnosis not present

## 2018-02-08 DIAGNOSIS — W19XXXA Unspecified fall, initial encounter: Secondary | ICD-10-CM | POA: Diagnosis not present

## 2018-02-08 DIAGNOSIS — Z23 Encounter for immunization: Secondary | ICD-10-CM | POA: Diagnosis not present

## 2018-02-08 DIAGNOSIS — R5381 Other malaise: Secondary | ICD-10-CM | POA: Diagnosis not present

## 2018-02-08 DIAGNOSIS — R0600 Dyspnea, unspecified: Secondary | ICD-10-CM | POA: Diagnosis not present

## 2018-02-08 MED ORDER — POTASSIUM CHLORIDE CRYS ER 20 MEQ PO TBCR: 20.0000 meq | EXTENDED_RELEASE_TABLET | Freq: Two times a day (BID) | ORAL | 3 refills | Status: DC

## 2018-02-08 MED ORDER — HYDRALAZINE HCL 25 MG PO TABS: 25.0000 mg | ORAL_TABLET | Freq: Two times a day (BID) | ORAL | 3 refills | Status: DC

## 2018-02-08 MED ORDER — FUROSEMIDE 40 MG PO TABS: 40.0000 mg | ORAL_TABLET | Freq: Two times a day (BID) | ORAL | 3 refills | Status: DC

## 2018-02-08 MED ORDER — TRAMADOL HCL 50 MG PO TABS: 25.0000 mg | ORAL_TABLET | Freq: Every day | ORAL | 0 refills | Status: DC

## 2018-02-08 MED ORDER — ACETAMINOPHEN 500 MG PO TABS: 1000.0000 mg | ORAL_TABLET | Freq: Three times a day (TID) | ORAL | 3 refills | Status: DC

## 2018-02-08 NOTE — Progress Notes (Signed)
[facility]  Provider: Blanchie Serve MD   Location:  [place] of Service:  Clinic (12)  PCP: Blanchie Serve, MD Patient Care Team: Blanchie Serve, MD as PCP - General (Internal Medicine)  Extended Emergency Contact Information Primary Emergency Contact: Mackert,[name] Address: [place],  [address] [address] [place] Phone: [phone] Mobile Phone: [phone] Relation: Son Secondary Emergency Contact: Auten,[name]  [place] Phone: [phone] Relation: None   Goals of Care: Advanced Directive information Advanced Directives 02/08/2018  Does Patient Have a Medical Advance Directive? Yes  Type of Advance Directive Out of facility DNR (pink MOST or yellow form)  Does patient want to make changes to medical advance directive? No - Patient declined  Copy of [place] in Chart? -  Pre-existing out of facility DNR order (yellow form or pink MOST form) Yellow form placed in chart (order not valid for inpatient use)      Chief Complaint  Patient presents with  . Acute Visit    Follow up since being discharged back to [place]. Patieint mentioned that still having some back pain. Patient mentioned that it was so hard getting dressed. Pain 8/10. She also mentioned that she has been feeling fatigued and nausea.  . Medication Refill    No refills needed at this time   . Results    Discuss labs     HPI: Patient is a 84 y.o. female seen today for follow up post discharge from SNF. She is here with her daughter present. Her leg edema has increased since her discharge. She has shortness of breath after mild exertion. Denies chest pain or palpitation. Her back pain is bothering her and grades pain 7-9/10. Pain mainly in morning once out of bed. Has been off tramadol since discharge from facility, took half a tablet of her old supply of tramadol with great relief few days back. BP reading  from home checked. Readings mostly in 150-170. Taking her amlodipine, losartan and lasix. Taking tylenol 1000 mg bid at present.  Past Medical History:  Diagnosis Date  . Anemia   . Anxiety   . Chronotropic incompetence 12/2012    potentially medication related; noted on CPET   . DDD (degenerative disc disease)   . Eczema   . GERD (gastroesophageal reflux disease)   . H/O hiatal hernia   . Hx: UTI (urinary tract infection)   . Hyperlipidemia   . Hypertension   . Hypothyroidism   . Osteopenia   . Septicemia ([place]) 2002   following UTI  . Vertigo, benign positional    Past Surgical History:  Procedure Laterality Date  . BREAST SURGERY     left biopsy  . CATARACT EXTRACTION Right    2 weeks ago  . CPET / MET - PFTS     Consistent with chronotropic incompetence; See attached report in the results section  . DILATION AND CURETTAGE OF UTERUS     x 2  . DOPPLER ECHOCARDIOGRAPHY  01/10/2013   Normal LV size and function. Normal EF. Air sclerosis but no stenosis  . ESOPHAGOGASTRODUODENOSCOPY  05/04/2012   Procedure: ESOPHAGOGASTRODUODENOSCOPY (EGD);  Surgeon: Inda Castle, MD;  Location: [facility];  Service: Endoscopy;  Laterality: N/A;  . EYE SURGERY     cataract extraction with ILO  ? eye  . IR KYPHO EA ADDL LEVEL THORACIC OR LUMBAR  10/28/2016  .  IR KYPHO LUMBAR INC FX REDUCE BONE BX UNI/BIL CANNULATION INC/IMAGING  10/28/2016  . IR KYPHO THORACIC WITH BONE BIOPSY  12/24/2016  . IR RADIOLOGIST EVAL & MGMT  11/11/2016  . KNEE ARTHROSCOPY  2011   right  . TONSILLECTOMY    . TOTAL KNEE ARTHROPLASTY  04/24/2012   Procedure: TOTAL KNEE ARTHROPLASTY;  Surgeon: Gearlean Alf, MD;  Location: [facility];  Service: Orthopedics;  Laterality: Right;    reports that she quit smoking about 52 years ago. Her smoking use included cigarettes. She quit after 0.00 years of use. She has never used smokeless tobacco. She reports that she does not drink alcohol or use drugs. Social History    Socioeconomic History  . Marital status: Widowed    Spouse name: [name]  . Number of children: 3  . Years of education: Not on file  . Highest education level: Not on file  Occupational History  . Occupation: homemaker  Social Needs  . Financial resource strain: Not hard at all  . Food insecurity:    Worry: Never true    Inability: Never true  . Transportation needs:    Medical: No    Non-medical: No  Tobacco Use  . Smoking status: Former Smoker    Years: 0.00    Types: Cigarettes    Last attempt to quit: 05/03/1965    Years since quitting: 52.8  . Smokeless tobacco: Never Used  Substance and Sexual Activity  . Alcohol use: No    Comment: occ glass wine  . Drug use: No  . Sexual activity: Not on file  Lifestyle  . Physical activity:    Days per week: 7 days    Minutes per session: 10 min  . Stress: To some extent  Relationships  . Social connections:    Talks on phone: More than three times a week    Gets together: More than three times a week    Attends religious service: Never    Active member of club or organization: No    Attends meetings of clubs or organizations: Never    Relationship status: Widowed  . Intimate partner violence:    Fear of current or ex partner: No    Emotionally abused: No    Physically abused: No    Forced sexual activity: No  Other Topics Concern  . Not on file  Social History Narrative   She is a widowed mother of 3.   She is a former smoker, with a distant history having quit in 1966.   She does drink an occasional glass of red wine.   She is just now started to try doing exercise with water aerobics.    Functional Status Survey:    Family History  Problem Relation Age of Onset  . Lung cancer Father 58       Died at age 42  . Stroke Mother 31       Died in her late 8s.  Marland Kitchen Heart attack Mother 60  . Diabetes Mother   . Arthritis Mother     Health Maintenance  Topic Date Due  . DEXA SCAN  07/30/1997  . PNA vac Low  Risk Adult (2 of 2 - PCV13) 12/21/2014  . TETANUS/TDAP  09/25/2016  . INFLUENZA VACCINE  02/23/2018    No Known Allergies  Outpatient Encounter Medications as of 02/08/2018  Medication Sig  . acetaminophen (TYLENOL) 500 MG tablet Take 2 tablets (1,000 mg total) by mouth 2 (two) times daily.  Marland Kitchen  ALPRAZolam (XANAX) 0.25 MG tablet TAKE 1 TABLET BY MOUTH EVERY DAY AS NEEDED  . amLODipine (NORVASC) 10 MG tablet Take 1 tablet (10 mg total) by mouth daily.  Marland Kitchen buPROPion (WELLBUTRIN SR) 150 MG 12 hr tablet Take 1 tablet (150 mg total) by mouth 2 (two) times daily after a meal.  . calcium carbonate (OS-CAL) 600 MG TABS tablet Take 1 tablet (600 mg total) by mouth 2 (two) times daily with a meal.  . cholecalciferol (VITAMIN D) 1000 UNITS tablet Take 1,000 Units by mouth daily.  Marland Kitchen docusate sodium (COLACE) 100 MG capsule Take 100 mg by mouth 2 (two) times daily.  . fish oil-omega-3 fatty acids 1000 MG capsule Take 1 g by mouth daily.  . furosemide (LASIX) 20 MG tablet Take 1 tablet (20 mg total) by mouth 2 (two) times daily.  Marland Kitchen levothyroxine (SYNTHROID, LEVOTHROID) 75 MCG tablet Take 1 tablet (75 mcg total) by mouth daily before breakfast.  . loperamide (IMODIUM A-D) 2 MG tablet Take 2 mg by mouth as needed for diarrhea or loose stools.  Marland Kitchen losartan (COZAAR) 100 MG tablet Take 1 tablet (100 mg total) by mouth daily.  . Multiple Vitamin (MULTIVITAMIN WITH MINERALS) TABS tablet Take 1 tablet by mouth daily.  . nitrofurantoin (MACRODANTIN) 50 MG capsule Take 1 capsule (50 mg total) by mouth daily.  . NON FORMULARY Take 12,600 mg by mouth daily as needed (Take 1 capsule). Triple Strength Cranberry  . omeprazole (PRILOSEC) 40 MG capsule Take 1 capsule (40 mg total) by mouth daily.  . potassium chloride SA (K-DUR,KLOR-CON) 20 MEQ tablet Take 1 tablet (20 mEq total) by mouth daily.  . TYMLOS 3120 MCG/1.56ML SOPN INJECT 80MCG SUBCUTANEOUSLY ONCE DAILY  . zinc oxide 20 % ointment Apply 1 application topically as  needed for irritation.  . sennosides-docusate sodium (SENOKOT-S) 8.6-50 MG tablet Take 2 tablets by mouth at bedtime. (Patient not taking: Reported on 02/08/2018)   No facility-administered encounter medications on file as of 02/08/2018.     Review of Systems  Constitutional: Positive for fatigue. Negative for chills and fever.  HENT: Negative for congestion and trouble swallowing.   Eyes: Positive for visual disturbance.  Respiratory: Positive for shortness of breath. Negative for cough and wheezing.   Cardiovascular: Positive for leg swelling. Negative for chest pain and palpitations.  Gastrointestinal: Negative for abdominal pain, constipation, diarrhea, nausea and vomiting.  Genitourinary: Negative for dysuria and flank pain.  Musculoskeletal: Positive for back pain and gait problem. Negative for joint swelling.  Skin: Negative for rash.  Neurological: Negative for dizziness, numbness and headaches.  Psychiatric/Behavioral: Positive for confusion and dysphoric mood.       Short term memory issue    Vitals:   02/08/18 0931  BP: (!) 152/70  Pulse: 71  Resp: 18  Temp: 97.6 F (36.4 C)  TempSrc: Oral  SpO2: 96%  Weight: 168 lb 6.4 oz (76.4 kg)  Height: '5\' 4"'  (1.626 m)   Body mass index is 28.91 kg/m.   Wt Readings from Last 3 Encounters:  02/08/18 168 lb 6.4 oz (76.4 kg)  01/25/18 170 lb (77.1 kg)  01/18/18 162 lb (73.5 kg)   Physical Exam  Constitutional: She is oriented to person, place, and time. No distress.  Overweight elderly female  HENT:  Head: Normocephalic and atraumatic.  Mouth/Throat: Oropharynx is clear and moist.  Eyes: Pupils are equal, round, and reactive to light. Conjunctivae and EOM are normal. Right eye exhibits no discharge. Left eye exhibits no discharge.  Neck: Normal range of motion. Neck supple.  Cardiovascular: Normal rate and regular rhythm.  Pulmonary/Chest: Effort normal. No respiratory distress. She has no wheezes. She has rales.  Poor  air movement  Abdominal: Soft. Bowel sounds are normal. There is no tenderness. There is no guarding.  Musculoskeletal: Normal range of motion.  Tenderness along lumbar spine and paraspinal area, unsteady gait, edema present, uses walker  Lymphadenopathy:    She has no cervical adenopathy.  Neurological: She is alert and oriented to person, place, and time. She exhibits normal muscle tone.  Skin: Skin is warm and dry. She is not diaphoretic.  Psychiatric: She has a normal mood and affect.    Labs reviewed: Basic Metabolic Panel: Recent Labs    01/15/18 0524 01/16/18 0356 01/23/18 02/06/18 0745  NA 138 135 134* 134*  K 3.8 4.3 4.3 3.8  CL 106 106 97 95*  CO2 '26 24 30 28  ' GLUCOSE 104* 103*  --  98  BUN 19 21* 24* 22  CREATININE 0.63 0.56 0.7 0.90*  CALCIUM 8.9 8.6* 9.6 10.1   Liver Function Tests: Recent Labs    10/25/17 1446 12/09/17 0000 01/14/18 0928 01/23/18 02/06/18 0745  AST 30 29 60* 24 21  ALT '22 17 26 21 14  ' ALKPHOS 99  --  85 84  --   BILITOT 0.3 0.4 0.8  --  0.4  PROT 7.4 7.8 7.9 6.6 7.4  ALBUMIN 3.9  --  4.3 3.9  --    No results for input(s): LIPASE, AMYLASE in the last 8760 hours. No results for input(s): AMMONIA in the last 8760 hours. CBC: Recent Labs    08/25/17 0845 10/25/17 1446  01/14/18 0928 01/15/18 0524 01/16/18 0356 01/23/18 02/06/18 0745  WBC 3.4* 5.2   < > 10.2 5.7 5.1 4.3 4.5  NEUTROABS 1,992 3.1  --  7.9*  --   --   --   --   HGB 10.4* 10.7*   < > 12.3 10.9* 9.7* 10.1* 10.9*  HCT 31.6* 32.8*   < > 37.3 33.7* 29.8* 30* 32.0*  MCV 82.9 87.9   < > 88.0 89.6 89.5  --  85.8  PLT 97* 84*   < > 98* 80* 75* 106* 77*   < > = values in this interval not displayed.   Cardiac Enzymes: Recent Labs    01/14/18 0928 01/16/18 0356  CKTOTAL 1,707* 621*   BNP: Invalid input(s): POCBNP No results found for: HGBA1C Lab Results  Component Value Date   TSH 2.63 12/09/2017   Lab Results  Component Value Date   VITAMINB12 1,333 (H)  10/25/2017   Lab Results  Component Value Date   FOLATE 23.0 10/25/2017   Lab Results  Component Value Date   IRON 94 10/25/2017   TIBC 366 10/25/2017   FERRITIN 102 10/25/2017    Lipid Panel: Recent Labs    02/10/17 06/23/17 0810 12/09/17 0000  CHOL 186 205* 212*  HDL 65 95 72  LDLCALC 107 95 121*  TRIG 70 63 88  CHOLHDL  --  2.2 2.9   No results found for: HGBA1C  Procedures since last visit: Dg Pelvis 1-2 Views  Result Date: 01/14/2018 CLINICAL DATA:  Fall last night onto right side. EXAM: PELVIS - 1-2 VIEW COMPARISON:  None. FINDINGS: There is no evidence of pelvic fracture or diastasis. No pelvic bone lesions are seen. Mild degenerate change of the spine and hips. IMPRESSION: No acute fracture. Electronically Signed   By: Quillian Quince  Derrel Nip M.D.   On: 01/14/2018 10:19   Dg Tibia/fibula Right  Result Date: 01/14/2018 CLINICAL DATA:  Fall last night onto right side with right lower leg pain. EXAM: RIGHT TIBIA AND FIBULA - 2 VIEW COMPARISON:  None. FINDINGS: Right total knee arthroplasty intact. No acute fracture or dislocation. IMPRESSION: No acute findings. Electronically Signed   By: Marin Olp M.D.   On: 01/14/2018 10:22   Ct Head Wo Contrast  Result Date: 01/14/2018 CLINICAL DATA:  Fall last night landing on right side. EXAM: CT HEAD WITHOUT CONTRAST CT CERVICAL SPINE WITHOUT CONTRAST TECHNIQUE: Multidetector CT imaging of the head and cervical spine was performed following the standard protocol without intravenous contrast. Multiplanar CT image reconstructions of the cervical spine were also generated. COMPARISON:  None. FINDINGS: CT HEAD FINDINGS Brain: Ventricles, cisterns and other CSF spaces are within normal as there is mild age related atrophic change. Mild to moderate chronic ischemic microvascular disease is present. There is no mass, mass effect, shift of midline structures or acute hemorrhage. No evidence of acute infarction. Vascular: No hyperdense vessel or  unexpected calcification. Skull: Normal. Negative for fracture or focal lesion. Sinuses/Orbits: No acute finding. Other: None. CT CERVICAL SPINE FINDINGS Alignment: 2 mm anterior subluxation of C7 on T1 which is degenerative due to moderate facet arthropathy. Skull base and vertebrae: Moderate spondylosis of the cervical spine. Vertebral body heights are maintained. Atlantoaxial articulation is within normal. There is moderate uncovertebral joint spurring and facet arthropathy. There is neural foraminal narrowing at multiple levels due to adjacent bony spurring. There is no acute fracture. Soft tissues and spinal canal: No prevertebral fluid or swelling. No visible canal hematoma. Disc levels: Moderate disc space narrowing at the C5-6 and C6-7 levels. Upper chest: Negative. Other: None. IMPRESSION: No acute brain injury. Mild to moderate chronic ischemic microvascular disease and mild age related atrophic change. No acute cervical spine injury. Moderate spondylosis of the cervical spine with moderate disc disease at the C5-6 and C6-7 levels. Moderate bilateral neural foraminal narrowing at multiple levels due to adjacent bony spurring. Electronically Signed   By: Marin Olp M.D.   On: 01/14/2018 10:13   Ct Chest W Contrast  Result Date: 01/14/2018 CLINICAL DATA:  84 year old fell from bed.  Abdominal trauma. EXAM: CT CHEST, ABDOMEN, AND PELVIS WITH CONTRAST TECHNIQUE: Multidetector CT imaging of the chest, abdomen and pelvis was performed following the standard protocol during bolus administration of intravenous contrast. CONTRAST:  112m ISOVUE-300 IOPAMIDOL (ISOVUE-300) INJECTION 61% COMPARISON:  12/21/2016 FINDINGS: CT CHEST FINDINGS Cardiovascular: Atherosclerotic calcifications in the coronary arteries and throughout the thoracic aorta. No evidence to suggest aortic aneurysm or dissection. Main pulmonary arteries are patent. Heart size is normal without significant pericardial fluid. Mediastinum/Nodes:  Small hiatal hernia. No lymph node enlargement in the chest. Lungs/Pleura: Trachea and mainstem bronchi are patent. Negative for pneumothorax. Scarring at the lung apices, right side greater than left. Scarring and/or atelectasis at both lung bases. No large pleural effusions. 5 mm pleural-based nodule at the right lung base along the right major fissure on sequence 4, image 112. This was present on the study from 12/21/2016 and likely an incidental finding. Peripheral reticular densities in the right upper lung are suggestive for chronic changes, possibly mild fibrosis in these areas. No significant airspace disease or consolidation. Musculoskeletal: Both shoulders are located. Clavicles are intact. Old compression fracture at T9 with vertebral body cement. Old compression fracture at T11 with previous vertebral augmentation. No evidence for a displaced rib fracture.  CT ABDOMEN PELVIS FINDINGS Hepatobiliary: Gallbladder is mildly distended without inflammatory changes. Normal appearance of the liver. No biliary dilatation. The main portal venous system is patent. Pancreas: Pancreas is poorly characterized. However, there appear to be a tubular or cystic structure along the top of the pancreatic body or adjacent to the pancreas body on sequence 2, image 57. This structure measures greater than 2.5 cm. In retrospect, this was present on 12/21/2016 and may have been present on the noncontrast study from 05/26/2010. No acute inflammatory changes involving the pancreas. Spleen: Normal in size without focal abnormality. Adrenals/Urinary Tract: Adrenal glands are unremarkable. Normal appearance of both kidneys without hydronephrosis. No suspicious renal lesions. Urinary bladder is markedly distended extending into the lower abdomen. Stomach/Bowel: Small hiatal hernia. No evidence for bowel obstruction or focal bowel inflammation. Vascular/Lymphatic: Aorta and visceral arteries are heavily calcified without aneurysm. No  lymph node enlargement in the abdomen or pelvis. Reproductive: Uterus and adnexal structures are unremarkable. Other: There is soft tissue in the presacral space on sequence 2, image 100. This area measures roughly 2.9 x 1.8 x 3.2 cm. Similar finding was present on the CT from 10/24/2016 and there may have also been some soft tissue in this area dating back to 05/26/2010. This is likely a benign etiology. There are low-density areas within this soft tissues suggesting there may be a fat or lipoma component. No evidence for adjacent bone destruction. Negative for ascites. Negative for free air. Musculoskeletal: Old compression fractures with vertebral body cement at L1 and L2. Disc space narrowing at L4-L5. No evidence for a new vertebral body fracture. IMPRESSION: No acute abnormalities within the chest, abdomen or pelvis. Urinary bladder distension without hydronephrosis. Indeterminate soft tissue in the presacral space measuring up to 3.2 cm. This has been present for multiple years although there has probably been interval growth. Favor a benign etiology. There is low-density material within this structure and this could represent a benign myelolipoma. **An incidental finding of potential clinical significance has been found. Probable cystic or tubular structure along the superior aspect of the pancreatic body. This is poorly characterized on this examination but suspect this has been stable for at least 1 year if not longer. Consider further characterization of this area with MRI in 6 months.** Old compression fractures and previous vertebral body augmentation procedures. No evidence for an acute vertebral body compression fracture. Chronic changes and scarring in lungs particularly in the right upper lung and apex regions. Electronically Signed   By: Markus Daft M.D.   On: 01/14/2018 14:04   Ct Cervical Spine Wo Contrast  Result Date: 01/14/2018 CLINICAL DATA:  Fall last night landing on right side. EXAM: CT  HEAD WITHOUT CONTRAST CT CERVICAL SPINE WITHOUT CONTRAST TECHNIQUE: Multidetector CT imaging of the head and cervical spine was performed following the standard protocol without intravenous contrast. Multiplanar CT image reconstructions of the cervical spine were also generated. COMPARISON:  None. FINDINGS: CT HEAD FINDINGS Brain: Ventricles, cisterns and other CSF spaces are within normal as there is mild age related atrophic change. Mild to moderate chronic ischemic microvascular disease is present. There is no mass, mass effect, shift of midline structures or acute hemorrhage. No evidence of acute infarction. Vascular: No hyperdense vessel or unexpected calcification. Skull: Normal. Negative for fracture or focal lesion. Sinuses/Orbits: No acute finding. Other: None. CT CERVICAL SPINE FINDINGS Alignment: 2 mm anterior subluxation of C7 on T1 which is degenerative due to moderate facet arthropathy. Skull base and vertebrae: Moderate spondylosis of  the cervical spine. Vertebral body heights are maintained. Atlantoaxial articulation is within normal. There is moderate uncovertebral joint spurring and facet arthropathy. There is neural foraminal narrowing at multiple levels due to adjacent bony spurring. There is no acute fracture. Soft tissues and spinal canal: No prevertebral fluid or swelling. No visible canal hematoma. Disc levels: Moderate disc space narrowing at the C5-6 and C6-7 levels. Upper chest: Negative. Other: None. IMPRESSION: No acute brain injury. Mild to moderate chronic ischemic microvascular disease and mild age related atrophic change. No acute cervical spine injury. Moderate spondylosis of the cervical spine with moderate disc disease at the C5-6 and C6-7 levels. Moderate bilateral neural foraminal narrowing at multiple levels due to adjacent bony spurring. Electronically Signed   By: Marin Olp M.D.   On: 01/14/2018 10:13   Ct Abdomen Pelvis W Contrast  Result Date: 01/14/2018 CLINICAL  DATA:  84 year old fell from bed.  Abdominal trauma. EXAM: CT CHEST, ABDOMEN, AND PELVIS WITH CONTRAST TECHNIQUE: Multidetector CT imaging of the chest, abdomen and pelvis was performed following the standard protocol during bolus administration of intravenous contrast. CONTRAST:  139m ISOVUE-300 IOPAMIDOL (ISOVUE-300) INJECTION 61% COMPARISON:  12/21/2016 FINDINGS: CT CHEST FINDINGS Cardiovascular: Atherosclerotic calcifications in the coronary arteries and throughout the thoracic aorta. No evidence to suggest aortic aneurysm or dissection. Main pulmonary arteries are patent. Heart size is normal without significant pericardial fluid. Mediastinum/Nodes: Small hiatal hernia. No lymph node enlargement in the chest. Lungs/Pleura: Trachea and mainstem bronchi are patent. Negative for pneumothorax. Scarring at the lung apices, right side greater than left. Scarring and/or atelectasis at both lung bases. No large pleural effusions. 5 mm pleural-based nodule at the right lung base along the right major fissure on sequence 4, image 112. This was present on the study from 12/21/2016 and likely an incidental finding. Peripheral reticular densities in the right upper lung are suggestive for chronic changes, possibly mild fibrosis in these areas. No significant airspace disease or consolidation. Musculoskeletal: Both shoulders are located. Clavicles are intact. Old compression fracture at T9 with vertebral body cement. Old compression fracture at T11 with previous vertebral augmentation. No evidence for a displaced rib fracture. CT ABDOMEN PELVIS FINDINGS Hepatobiliary: Gallbladder is mildly distended without inflammatory changes. Normal appearance of the liver. No biliary dilatation. The main portal venous system is patent. Pancreas: Pancreas is poorly characterized. However, there appear to be a tubular or cystic structure along the top of the pancreatic body or adjacent to the pancreas body on sequence 2, image 57. This  structure measures greater than 2.5 cm. In retrospect, this was present on 12/21/2016 and may have been present on the noncontrast study from 05/26/2010. No acute inflammatory changes involving the pancreas. Spleen: Normal in size without focal abnormality. Adrenals/Urinary Tract: Adrenal glands are unremarkable. Normal appearance of both kidneys without hydronephrosis. No suspicious renal lesions. Urinary bladder is markedly distended extending into the lower abdomen. Stomach/Bowel: Small hiatal hernia. No evidence for bowel obstruction or focal bowel inflammation. Vascular/Lymphatic: Aorta and visceral arteries are heavily calcified without aneurysm. No lymph node enlargement in the abdomen or pelvis. Reproductive: Uterus and adnexal structures are unremarkable. Other: There is soft tissue in the presacral space on sequence 2, image 100. This area measures roughly 2.9 x 1.8 x 3.2 cm. Similar finding was present on the CT from 10/24/2016 and there may have also been some soft tissue in this area dating back to 05/26/2010. This is likely a benign etiology. There are low-density areas within this soft tissues suggesting there  may be a fat or lipoma component. No evidence for adjacent bone destruction. Negative for ascites. Negative for free air. Musculoskeletal: Old compression fractures with vertebral body cement at L1 and L2. Disc space narrowing at L4-L5. No evidence for a new vertebral body fracture. IMPRESSION: No acute abnormalities within the chest, abdomen or pelvis. Urinary bladder distension without hydronephrosis. Indeterminate soft tissue in the presacral space measuring up to 3.2 cm. This has been present for multiple years although there has probably been interval growth. Favor a benign etiology. There is low-density material within this structure and this could represent a benign myelolipoma. **An incidental finding of potential clinical significance has been found. Probable cystic or tubular structure  along the superior aspect of the pancreatic body. This is poorly characterized on this examination but suspect this has been stable for at least 1 year if not longer. Consider further characterization of this area with MRI in 6 months.** Old compression fractures and previous vertebral body augmentation procedures. No evidence for an acute vertebral body compression fracture. Chronic changes and scarring in lungs particularly in the right upper lung and apex regions. Electronically Signed   By: Markus Daft M.D.   On: 01/14/2018 14:04   Dg Chest Port 1 View  Result Date: 01/14/2018 CLINICAL DATA:  Fall last night onto right side. EXAM: PORTABLE CHEST 1 VIEW COMPARISON:  10/01/2017 FINDINGS: Lungs are adequately inflated and otherwise clear. Cardiomediastinal silhouette is within normal. No acute fracture. Stable thoracic spine compression fractures. IMPRESSION: No acute findings. Electronically Signed   By: Marin Olp M.D.   On: 01/14/2018 10:20   Dg Femur 1v Right  Result Date: 01/14/2018 CLINICAL DATA:  Fall last night with right-sided pain. EXAM: RIGHT FEMUR 1 VIEW COMPARISON:  None. FINDINGS: Mild degenerate change of the right hip. Right total knee arthroplasty intact and normally located. No evidence of acute fracture or dislocation. IMPRESSION: No acute findings. Electronically Signed   By: Marin Olp M.D.   On: 01/14/2018 10:23    Assessment/Plan  1. Essential hypertension Start hydralazine 25 mg bid. Continue amlodipine and losartan current regimen, adjust/ increase dosing of hydralazine if needed. Continue to monitor BP at home - CMP with eGFR([facility]); Future - CBC (no diff); Future - hydrALAZINE (APRESOLINE) 25 MG tablet; Take 1 tablet (25 mg total) by mouth 2 (two) times daily.  Dispense: 180 tablet; Refill: 3  2. Anemia of chronic disease Low but stable h&h. Monitor. Normal G92, folic acid and iron panel - CMP with eGFR([facility]); Future - CBC (no diff); Future  3. PVD  (peripheral vascular disease) (HCC) Increase lasix to 40 mg bid from 20 mg bid. Increase kcl to 20 meq bid, ted stokcings - CMP with eGFR([facility]); Future - potassium chloride SA (K-DUR,KLOR-CON) 20 MEQ tablet; Take 1 tablet (20 mEq total) by mouth 2 (two) times daily.  Dispense: 60 tablet; Refill: 3 - furosemide (LASIX) 40 MG tablet; Take 1 tablet (40 mg total) by mouth 2 (two) times daily.  Dispense: 60 tablet; Refill: 3  4. Chronic back pain, unspecified back location, unspecified back pain laterality Change tylenol dosing as below. Start tramadol 25 mg qhs. Advised to have follow up with neurosurgery team - CBC (no diff); Future - acetaminophen (TYLENOL) 500 MG tablet; Take 2 tablets (1,000 mg total) by mouth 3 (three) times daily.  Dispense: 60 tablet; Refill: 3 - traMADol (ULTRAM) 50 MG tablet; Take 0.5 tablets (25 mg total) by mouth at bedtime.  Dispense: 30 tablet; Refill: 0  5.  Hypertensive heart disease with chronic diastolic congestive heart failure (HCC) Adjust dosing of lasix and kcl, continue losartan - potassium chloride SA (K-DUR,KLOR-CON) 20 MEQ tablet; Take 1 tablet (20 mEq total) by mouth 2 (two) times daily.  Dispense: 60 tablet; Refill: 3 - furosemide (LASIX) 40 MG tablet; Take 1 tablet (40 mg total) by mouth 2 (two) times daily.  Dispense: 60 tablet; Refill: 3    Labs/tests ordered:   Lab Orders     CMP with eGFR([facility])     CBC (no diff)  Next appointment: 3 weeks  Communication: reviewed care plan with patient and her daughter    Blanchie Serve, MD Internal Medicine [place], [place] [address] Cell Phone (Monday-Friday 8 am - 5 pm): [phone] On Call: [phone] and follow prompts after 5 pm and on weekends Office Phone: [phone] Office Fax: [phone]

## 2018-02-09 DIAGNOSIS — R0600 Dyspnea, unspecified: Secondary | ICD-10-CM | POA: Diagnosis not present

## 2018-02-09 DIAGNOSIS — Z008 Encounter for other general examination: Secondary | ICD-10-CM | POA: Diagnosis not present

## 2018-02-09 DIAGNOSIS — Z1389 Encounter for screening for other disorder: Secondary | ICD-10-CM | POA: Diagnosis not present

## 2018-02-09 DIAGNOSIS — R0781 Pleurodynia: Secondary | ICD-10-CM | POA: Diagnosis not present

## 2018-02-09 DIAGNOSIS — R269 Unspecified abnormalities of gait and mobility: Secondary | ICD-10-CM | POA: Diagnosis not present

## 2018-02-09 DIAGNOSIS — R5381 Other malaise: Secondary | ICD-10-CM | POA: Diagnosis not present

## 2018-02-09 DIAGNOSIS — M5380 Other specified dorsopathies, site unspecified: Secondary | ICD-10-CM | POA: Diagnosis not present

## 2018-02-09 DIAGNOSIS — W19XXXA Unspecified fall, initial encounter: Secondary | ICD-10-CM | POA: Diagnosis not present

## 2018-02-09 DIAGNOSIS — R2681 Unsteadiness on feet: Secondary | ICD-10-CM | POA: Diagnosis not present

## 2018-02-09 DIAGNOSIS — M546 Pain in thoracic spine: Secondary | ICD-10-CM | POA: Diagnosis not present

## 2018-02-09 DIAGNOSIS — Z23 Encounter for immunization: Secondary | ICD-10-CM | POA: Diagnosis not present

## 2018-02-09 DIAGNOSIS — M810 Age-related osteoporosis without current pathological fracture: Secondary | ICD-10-CM | POA: Diagnosis not present

## 2018-02-09 DIAGNOSIS — M8580 Other specified disorders of bone density and structure, unspecified site: Secondary | ICD-10-CM | POA: Diagnosis not present

## 2018-02-09 DIAGNOSIS — Z0000 Encounter for general adult medical examination without abnormal findings: Secondary | ICD-10-CM | POA: Diagnosis not present

## 2018-02-09 DIAGNOSIS — M545 Low back pain: Secondary | ICD-10-CM | POA: Diagnosis not present

## 2018-02-09 DIAGNOSIS — N390 Urinary tract infection, site not specified: Secondary | ICD-10-CM | POA: Diagnosis not present

## 2018-02-09 DIAGNOSIS — M6281 Muscle weakness (generalized): Secondary | ICD-10-CM | POA: Diagnosis not present

## 2018-02-09 DIAGNOSIS — M1990 Unspecified osteoarthritis, unspecified site: Secondary | ICD-10-CM | POA: Diagnosis not present

## 2018-02-09 DIAGNOSIS — Z8719 Personal history of other diseases of the digestive system: Secondary | ICD-10-CM | POA: Diagnosis not present

## 2018-02-09 DIAGNOSIS — Z9181 History of falling: Secondary | ICD-10-CM | POA: Diagnosis not present

## 2018-02-12 ENCOUNTER — Other Ambulatory Visit: Payer: Self-pay | Admitting: Internal Medicine

## 2018-02-12 DIAGNOSIS — F321 Major depressive disorder, single episode, moderate: Secondary | ICD-10-CM

## 2018-02-15 DIAGNOSIS — M8580 Other specified disorders of bone density and structure, unspecified site: Secondary | ICD-10-CM | POA: Diagnosis not present

## 2018-02-15 DIAGNOSIS — N390 Urinary tract infection, site not specified: Secondary | ICD-10-CM | POA: Diagnosis not present

## 2018-02-15 DIAGNOSIS — Z9181 History of falling: Secondary | ICD-10-CM | POA: Diagnosis not present

## 2018-02-15 DIAGNOSIS — Z8719 Personal history of other diseases of the digestive system: Secondary | ICD-10-CM | POA: Diagnosis not present

## 2018-02-15 DIAGNOSIS — W19XXXA Unspecified fall, initial encounter: Secondary | ICD-10-CM | POA: Diagnosis not present

## 2018-02-15 DIAGNOSIS — M6281 Muscle weakness (generalized): Secondary | ICD-10-CM | POA: Diagnosis not present

## 2018-02-15 DIAGNOSIS — Z008 Encounter for other general examination: Secondary | ICD-10-CM | POA: Diagnosis not present

## 2018-02-15 DIAGNOSIS — R5381 Other malaise: Secondary | ICD-10-CM | POA: Diagnosis not present

## 2018-02-15 DIAGNOSIS — R0781 Pleurodynia: Secondary | ICD-10-CM | POA: Diagnosis not present

## 2018-02-15 DIAGNOSIS — M5380 Other specified dorsopathies, site unspecified: Secondary | ICD-10-CM | POA: Diagnosis not present

## 2018-02-15 DIAGNOSIS — M545 Low back pain: Secondary | ICD-10-CM | POA: Diagnosis not present

## 2018-02-15 DIAGNOSIS — Z1389 Encounter for screening for other disorder: Secondary | ICD-10-CM | POA: Diagnosis not present

## 2018-02-15 DIAGNOSIS — M1990 Unspecified osteoarthritis, unspecified site: Secondary | ICD-10-CM | POA: Diagnosis not present

## 2018-02-15 DIAGNOSIS — R2681 Unsteadiness on feet: Secondary | ICD-10-CM | POA: Diagnosis not present

## 2018-02-15 DIAGNOSIS — Z23 Encounter for immunization: Secondary | ICD-10-CM | POA: Diagnosis not present

## 2018-02-15 DIAGNOSIS — M810 Age-related osteoporosis without current pathological fracture: Secondary | ICD-10-CM | POA: Diagnosis not present

## 2018-02-15 DIAGNOSIS — R0600 Dyspnea, unspecified: Secondary | ICD-10-CM | POA: Diagnosis not present

## 2018-02-15 DIAGNOSIS — Z0000 Encounter for general adult medical examination without abnormal findings: Secondary | ICD-10-CM | POA: Diagnosis not present

## 2018-02-15 DIAGNOSIS — M546 Pain in thoracic spine: Secondary | ICD-10-CM | POA: Diagnosis not present

## 2018-02-15 DIAGNOSIS — R269 Unspecified abnormalities of gait and mobility: Secondary | ICD-10-CM | POA: Diagnosis not present

## 2018-02-22 DIAGNOSIS — Z0000 Encounter for general adult medical examination without abnormal findings: Secondary | ICD-10-CM | POA: Diagnosis not present

## 2018-02-22 DIAGNOSIS — Z008 Encounter for other general examination: Secondary | ICD-10-CM | POA: Diagnosis not present

## 2018-02-22 DIAGNOSIS — R0600 Dyspnea, unspecified: Secondary | ICD-10-CM | POA: Diagnosis not present

## 2018-02-22 DIAGNOSIS — M5380 Other specified dorsopathies, site unspecified: Secondary | ICD-10-CM | POA: Diagnosis not present

## 2018-02-22 DIAGNOSIS — R2681 Unsteadiness on feet: Secondary | ICD-10-CM | POA: Diagnosis not present

## 2018-02-22 DIAGNOSIS — R5381 Other malaise: Secondary | ICD-10-CM | POA: Diagnosis not present

## 2018-02-22 DIAGNOSIS — Z9181 History of falling: Secondary | ICD-10-CM | POA: Diagnosis not present

## 2018-02-22 DIAGNOSIS — M810 Age-related osteoporosis without current pathological fracture: Secondary | ICD-10-CM | POA: Diagnosis not present

## 2018-02-22 DIAGNOSIS — R0781 Pleurodynia: Secondary | ICD-10-CM | POA: Diagnosis not present

## 2018-02-22 DIAGNOSIS — Z8719 Personal history of other diseases of the digestive system: Secondary | ICD-10-CM | POA: Diagnosis not present

## 2018-02-22 DIAGNOSIS — M546 Pain in thoracic spine: Secondary | ICD-10-CM | POA: Diagnosis not present

## 2018-02-22 DIAGNOSIS — M545 Low back pain: Secondary | ICD-10-CM | POA: Diagnosis not present

## 2018-02-22 DIAGNOSIS — M1990 Unspecified osteoarthritis, unspecified site: Secondary | ICD-10-CM | POA: Diagnosis not present

## 2018-02-22 DIAGNOSIS — Z23 Encounter for immunization: Secondary | ICD-10-CM | POA: Diagnosis not present

## 2018-02-22 DIAGNOSIS — Z1389 Encounter for screening for other disorder: Secondary | ICD-10-CM | POA: Diagnosis not present

## 2018-02-22 DIAGNOSIS — W19XXXA Unspecified fall, initial encounter: Secondary | ICD-10-CM | POA: Diagnosis not present

## 2018-02-22 DIAGNOSIS — M6281 Muscle weakness (generalized): Secondary | ICD-10-CM | POA: Diagnosis not present

## 2018-02-22 DIAGNOSIS — R269 Unspecified abnormalities of gait and mobility: Secondary | ICD-10-CM | POA: Diagnosis not present

## 2018-02-22 DIAGNOSIS — M8580 Other specified disorders of bone density and structure, unspecified site: Secondary | ICD-10-CM | POA: Diagnosis not present

## 2018-02-22 DIAGNOSIS — N390 Urinary tract infection, site not specified: Secondary | ICD-10-CM | POA: Diagnosis not present

## 2018-02-23 HISTORY — PX: TRANSTHORACIC ECHOCARDIOGRAM: SHX275

## 2018-02-24 DIAGNOSIS — Z1389 Encounter for screening for other disorder: Secondary | ICD-10-CM | POA: Diagnosis not present

## 2018-02-24 DIAGNOSIS — W19XXXA Unspecified fall, initial encounter: Secondary | ICD-10-CM | POA: Diagnosis not present

## 2018-02-24 DIAGNOSIS — R0781 Pleurodynia: Secondary | ICD-10-CM | POA: Diagnosis not present

## 2018-02-24 DIAGNOSIS — R0600 Dyspnea, unspecified: Secondary | ICD-10-CM | POA: Diagnosis not present

## 2018-02-24 DIAGNOSIS — M545 Low back pain: Secondary | ICD-10-CM | POA: Diagnosis not present

## 2018-02-24 DIAGNOSIS — K219 Gastro-esophageal reflux disease without esophagitis: Secondary | ICD-10-CM | POA: Diagnosis not present

## 2018-02-24 DIAGNOSIS — N390 Urinary tract infection, site not specified: Secondary | ICD-10-CM | POA: Diagnosis not present

## 2018-02-24 DIAGNOSIS — M8580 Other specified disorders of bone density and structure, unspecified site: Secondary | ICD-10-CM | POA: Diagnosis not present

## 2018-02-24 DIAGNOSIS — Z8719 Personal history of other diseases of the digestive system: Secondary | ICD-10-CM | POA: Diagnosis not present

## 2018-02-24 DIAGNOSIS — Z008 Encounter for other general examination: Secondary | ICD-10-CM | POA: Diagnosis not present

## 2018-02-24 DIAGNOSIS — Z23 Encounter for immunization: Secondary | ICD-10-CM | POA: Diagnosis not present

## 2018-02-24 DIAGNOSIS — R2681 Unsteadiness on feet: Secondary | ICD-10-CM | POA: Diagnosis not present

## 2018-02-24 DIAGNOSIS — R5381 Other malaise: Secondary | ICD-10-CM | POA: Diagnosis not present

## 2018-02-24 DIAGNOSIS — R269 Unspecified abnormalities of gait and mobility: Secondary | ICD-10-CM | POA: Diagnosis not present

## 2018-02-24 DIAGNOSIS — Z9181 History of falling: Secondary | ICD-10-CM | POA: Diagnosis not present

## 2018-02-24 DIAGNOSIS — M5380 Other specified dorsopathies, site unspecified: Secondary | ICD-10-CM | POA: Diagnosis not present

## 2018-02-24 DIAGNOSIS — Z0000 Encounter for general adult medical examination without abnormal findings: Secondary | ICD-10-CM | POA: Diagnosis not present

## 2018-02-24 DIAGNOSIS — M810 Age-related osteoporosis without current pathological fracture: Secondary | ICD-10-CM | POA: Diagnosis not present

## 2018-02-24 DIAGNOSIS — M6281 Muscle weakness (generalized): Secondary | ICD-10-CM | POA: Diagnosis not present

## 2018-02-24 DIAGNOSIS — M1990 Unspecified osteoarthritis, unspecified site: Secondary | ICD-10-CM | POA: Diagnosis not present

## 2018-02-24 DIAGNOSIS — M546 Pain in thoracic spine: Secondary | ICD-10-CM | POA: Diagnosis not present

## 2018-02-27 DIAGNOSIS — R2681 Unsteadiness on feet: Secondary | ICD-10-CM | POA: Diagnosis not present

## 2018-02-27 DIAGNOSIS — M6281 Muscle weakness (generalized): Secondary | ICD-10-CM | POA: Diagnosis not present

## 2018-02-27 DIAGNOSIS — M546 Pain in thoracic spine: Secondary | ICD-10-CM | POA: Diagnosis not present

## 2018-02-27 DIAGNOSIS — R269 Unspecified abnormalities of gait and mobility: Secondary | ICD-10-CM | POA: Diagnosis not present

## 2018-02-27 DIAGNOSIS — D638 Anemia in other chronic diseases classified elsewhere: Secondary | ICD-10-CM | POA: Diagnosis not present

## 2018-02-27 DIAGNOSIS — W19XXXA Unspecified fall, initial encounter: Secondary | ICD-10-CM | POA: Diagnosis not present

## 2018-02-27 DIAGNOSIS — Z008 Encounter for other general examination: Secondary | ICD-10-CM | POA: Diagnosis not present

## 2018-02-27 DIAGNOSIS — I10 Essential (primary) hypertension: Secondary | ICD-10-CM

## 2018-02-27 DIAGNOSIS — M549 Dorsalgia, unspecified: Secondary | ICD-10-CM

## 2018-02-27 DIAGNOSIS — G8929 Other chronic pain: Secondary | ICD-10-CM

## 2018-02-27 DIAGNOSIS — I739 Peripheral vascular disease, unspecified: Secondary | ICD-10-CM

## 2018-02-27 DIAGNOSIS — M1990 Unspecified osteoarthritis, unspecified site: Secondary | ICD-10-CM | POA: Diagnosis not present

## 2018-02-27 DIAGNOSIS — Z8719 Personal history of other diseases of the digestive system: Secondary | ICD-10-CM | POA: Diagnosis not present

## 2018-02-27 DIAGNOSIS — K219 Gastro-esophageal reflux disease without esophagitis: Secondary | ICD-10-CM | POA: Diagnosis not present

## 2018-02-27 DIAGNOSIS — M5380 Other specified dorsopathies, site unspecified: Secondary | ICD-10-CM | POA: Diagnosis not present

## 2018-02-27 DIAGNOSIS — R0781 Pleurodynia: Secondary | ICD-10-CM | POA: Diagnosis not present

## 2018-02-27 DIAGNOSIS — R0600 Dyspnea, unspecified: Secondary | ICD-10-CM | POA: Diagnosis not present

## 2018-02-27 DIAGNOSIS — R5381 Other malaise: Secondary | ICD-10-CM | POA: Diagnosis not present

## 2018-02-27 DIAGNOSIS — Z1389 Encounter for screening for other disorder: Secondary | ICD-10-CM | POA: Diagnosis not present

## 2018-02-27 DIAGNOSIS — N390 Urinary tract infection, site not specified: Secondary | ICD-10-CM | POA: Diagnosis not present

## 2018-02-27 DIAGNOSIS — Z9181 History of falling: Secondary | ICD-10-CM | POA: Diagnosis not present

## 2018-02-27 DIAGNOSIS — M8580 Other specified disorders of bone density and structure, unspecified site: Secondary | ICD-10-CM | POA: Diagnosis not present

## 2018-02-27 DIAGNOSIS — Z0000 Encounter for general adult medical examination without abnormal findings: Secondary | ICD-10-CM | POA: Diagnosis not present

## 2018-02-27 DIAGNOSIS — Z23 Encounter for immunization: Secondary | ICD-10-CM | POA: Diagnosis not present

## 2018-02-27 DIAGNOSIS — M810 Age-related osteoporosis without current pathological fracture: Secondary | ICD-10-CM | POA: Diagnosis not present

## 2018-02-27 DIAGNOSIS — M545 Low back pain: Secondary | ICD-10-CM | POA: Diagnosis not present

## 2018-02-27 LAB — COMPLETE METABOLIC PANEL WITH GFR
AG RATIO: 1.4 (calc) (ref 1.0–2.5)
ALT: 17 U/L (ref 6–29)
AST: 25 U/L (ref 10–35)
Albumin: 4.8 g/dL (ref 3.6–5.1)
Alkaline phosphatase (APISO): 97 U/L (ref 33–130)
BILIRUBIN TOTAL: 0.4 mg/dL (ref 0.2–1.2)
BUN/Creatinine Ratio: 31 (calc) — ABNORMAL HIGH (ref 6–22)
BUN: 33 mg/dL — ABNORMAL HIGH (ref 7–25)
CHLORIDE: 98 mmol/L (ref 98–110)
CO2: 27 mmol/L (ref 20–32)
Calcium: 10.3 mg/dL (ref 8.6–10.4)
Creat: 1.07 mg/dL — ABNORMAL HIGH (ref 0.60–0.88)
GFR, EST AFRICAN AMERICAN: 55 mL/min/{1.73_m2} — AB (ref >=60)
GFR, Est Non African American: 47 mL/min/{1.73_m2} — ABNORMAL LOW (ref >=60)
GLOBULIN: 3.4 g/dL (ref 1.9–3.7)
Glucose, Bld: 104 mg/dL — ABNORMAL HIGH (ref 65–99)
POTASSIUM: 4.4 mmol/L (ref 3.5–5.3)
SODIUM: 136 mmol/L (ref 135–146)
TOTAL PROTEIN: 8.2 g/dL — AB (ref 6.1–8.1)

## 2018-02-27 LAB — CBC
HCT: 36.3 % (ref 35.0–45.0)
Hemoglobin: 11.9 g/dL (ref 11.7–15.5)
MCH: 28.5 pg (ref 27.0–33.0)
MCHC: 32.8 g/dL (ref 32.0–36.0)
MCV: 87.1 fL (ref 80.0–100.0)
PLATELETS: 79 10*3/uL — AB (ref 140–400)
RBC: 4.17 10*6/uL (ref 3.80–5.10)
RDW: 14.7 % (ref 11.0–15.0)
WBC: 4.7 10*3/uL (ref 3.8–10.8)

## 2018-03-01 DIAGNOSIS — R0781 Pleurodynia: Secondary | ICD-10-CM | POA: Diagnosis not present

## 2018-03-01 DIAGNOSIS — R269 Unspecified abnormalities of gait and mobility: Secondary | ICD-10-CM | POA: Diagnosis not present

## 2018-03-01 DIAGNOSIS — Z008 Encounter for other general examination: Secondary | ICD-10-CM | POA: Diagnosis not present

## 2018-03-01 DIAGNOSIS — M8580 Other specified disorders of bone density and structure, unspecified site: Secondary | ICD-10-CM | POA: Diagnosis not present

## 2018-03-01 DIAGNOSIS — M1990 Unspecified osteoarthritis, unspecified site: Secondary | ICD-10-CM | POA: Diagnosis not present

## 2018-03-01 DIAGNOSIS — N390 Urinary tract infection, site not specified: Secondary | ICD-10-CM | POA: Diagnosis not present

## 2018-03-01 DIAGNOSIS — R5381 Other malaise: Secondary | ICD-10-CM | POA: Diagnosis not present

## 2018-03-01 DIAGNOSIS — Z23 Encounter for immunization: Secondary | ICD-10-CM | POA: Diagnosis not present

## 2018-03-01 DIAGNOSIS — Z0000 Encounter for general adult medical examination without abnormal findings: Secondary | ICD-10-CM | POA: Diagnosis not present

## 2018-03-01 DIAGNOSIS — M6281 Muscle weakness (generalized): Secondary | ICD-10-CM | POA: Diagnosis not present

## 2018-03-01 DIAGNOSIS — R0600 Dyspnea, unspecified: Secondary | ICD-10-CM | POA: Diagnosis not present

## 2018-03-01 DIAGNOSIS — W19XXXA Unspecified fall, initial encounter: Secondary | ICD-10-CM | POA: Diagnosis not present

## 2018-03-01 DIAGNOSIS — R2681 Unsteadiness on feet: Secondary | ICD-10-CM | POA: Diagnosis not present

## 2018-03-01 DIAGNOSIS — M545 Low back pain: Secondary | ICD-10-CM | POA: Diagnosis not present

## 2018-03-01 DIAGNOSIS — Z8719 Personal history of other diseases of the digestive system: Secondary | ICD-10-CM | POA: Diagnosis not present

## 2018-03-01 DIAGNOSIS — Z1389 Encounter for screening for other disorder: Secondary | ICD-10-CM | POA: Diagnosis not present

## 2018-03-01 DIAGNOSIS — M810 Age-related osteoporosis without current pathological fracture: Secondary | ICD-10-CM | POA: Diagnosis not present

## 2018-03-01 DIAGNOSIS — K219 Gastro-esophageal reflux disease without esophagitis: Secondary | ICD-10-CM | POA: Diagnosis not present

## 2018-03-01 DIAGNOSIS — M546 Pain in thoracic spine: Secondary | ICD-10-CM | POA: Diagnosis not present

## 2018-03-01 DIAGNOSIS — M5380 Other specified dorsopathies, site unspecified: Secondary | ICD-10-CM | POA: Diagnosis not present

## 2018-03-01 DIAGNOSIS — Z9181 History of falling: Secondary | ICD-10-CM | POA: Diagnosis not present

## 2018-03-06 ENCOUNTER — Encounter: Payer: Self-pay | Admitting: Internal Medicine

## 2018-03-06 ENCOUNTER — Other Ambulatory Visit (HOSPITAL_COMMUNITY): Payer: Self-pay | Admitting: Interventional Radiology

## 2018-03-06 ENCOUNTER — Non-Acute Institutional Stay: Payer: Medicare Other | Admitting: Internal Medicine

## 2018-03-06 VITALS — BP 134/60 | HR 85 | Temp 97.7°F | Resp 20 | Ht 64.0 in | Wt 167.0 lb

## 2018-03-06 DIAGNOSIS — I5032 Chronic diastolic (congestive) heart failure: Secondary | ICD-10-CM

## 2018-03-06 DIAGNOSIS — Z9181 History of falling: Secondary | ICD-10-CM | POA: Diagnosis not present

## 2018-03-06 DIAGNOSIS — M810 Age-related osteoporosis without current pathological fracture: Secondary | ICD-10-CM | POA: Diagnosis not present

## 2018-03-06 DIAGNOSIS — R0602 Shortness of breath: Secondary | ICD-10-CM

## 2018-03-06 DIAGNOSIS — I739 Peripheral vascular disease, unspecified: Secondary | ICD-10-CM | POA: Diagnosis not present

## 2018-03-06 DIAGNOSIS — N390 Urinary tract infection, site not specified: Secondary | ICD-10-CM | POA: Diagnosis not present

## 2018-03-06 DIAGNOSIS — Z1389 Encounter for screening for other disorder: Secondary | ICD-10-CM | POA: Diagnosis not present

## 2018-03-06 DIAGNOSIS — R2681 Unsteadiness on feet: Secondary | ICD-10-CM | POA: Diagnosis not present

## 2018-03-06 DIAGNOSIS — M1990 Unspecified osteoarthritis, unspecified site: Secondary | ICD-10-CM | POA: Diagnosis not present

## 2018-03-06 DIAGNOSIS — I110 Hypertensive heart disease with heart failure: Secondary | ICD-10-CM

## 2018-03-06 DIAGNOSIS — G8929 Other chronic pain: Secondary | ICD-10-CM

## 2018-03-06 DIAGNOSIS — K862 Cyst of pancreas: Secondary | ICD-10-CM

## 2018-03-06 DIAGNOSIS — Z8719 Personal history of other diseases of the digestive system: Secondary | ICD-10-CM | POA: Diagnosis not present

## 2018-03-06 DIAGNOSIS — R0600 Dyspnea, unspecified: Secondary | ICD-10-CM | POA: Diagnosis not present

## 2018-03-06 DIAGNOSIS — Z008 Encounter for other general examination: Secondary | ICD-10-CM | POA: Diagnosis not present

## 2018-03-06 DIAGNOSIS — R269 Unspecified abnormalities of gait and mobility: Secondary | ICD-10-CM | POA: Diagnosis not present

## 2018-03-06 DIAGNOSIS — M549 Dorsalgia, unspecified: Principal | ICD-10-CM

## 2018-03-06 DIAGNOSIS — Z23 Encounter for immunization: Secondary | ICD-10-CM | POA: Diagnosis not present

## 2018-03-06 DIAGNOSIS — N289 Disorder of kidney and ureter, unspecified: Secondary | ICD-10-CM | POA: Diagnosis not present

## 2018-03-06 DIAGNOSIS — Z8781 Personal history of (healed) traumatic fracture: Secondary | ICD-10-CM

## 2018-03-06 DIAGNOSIS — K219 Gastro-esophageal reflux disease without esophagitis: Secondary | ICD-10-CM | POA: Diagnosis not present

## 2018-03-06 DIAGNOSIS — M545 Low back pain, unspecified: Secondary | ICD-10-CM

## 2018-03-06 DIAGNOSIS — Z0000 Encounter for general adult medical examination without abnormal findings: Secondary | ICD-10-CM | POA: Diagnosis not present

## 2018-03-06 DIAGNOSIS — M5380 Other specified dorsopathies, site unspecified: Secondary | ICD-10-CM | POA: Diagnosis not present

## 2018-03-06 DIAGNOSIS — W19XXXA Unspecified fall, initial encounter: Secondary | ICD-10-CM | POA: Diagnosis not present

## 2018-03-06 DIAGNOSIS — R0781 Pleurodynia: Secondary | ICD-10-CM | POA: Diagnosis not present

## 2018-03-06 DIAGNOSIS — M546 Pain in thoracic spine: Secondary | ICD-10-CM | POA: Diagnosis not present

## 2018-03-06 DIAGNOSIS — R5381 Other malaise: Secondary | ICD-10-CM | POA: Diagnosis not present

## 2018-03-06 DIAGNOSIS — M8580 Other specified disorders of bone density and structure, unspecified site: Secondary | ICD-10-CM | POA: Diagnosis not present

## 2018-03-06 DIAGNOSIS — M6281 Muscle weakness (generalized): Secondary | ICD-10-CM | POA: Diagnosis not present

## 2018-03-06 MED ORDER — POTASSIUM CHLORIDE ER 10 MEQ PO TBCR: EXTENDED_RELEASE_TABLET | ORAL | 3 refills | Status: DC

## 2018-03-06 MED ORDER — FUROSEMIDE 40 MG PO TABS: ORAL_TABLET | ORAL | 3 refills | Status: DC

## 2018-03-06 NOTE — Progress Notes (Signed)
[name]  Provider: Blanchie Serve [name]   Location:  [place] of Service:  Clinic (12)  PCP: Blanchie [name], [name], [name] [name],[name] [name], [name] [address] Johnnette Litter of [place] Phone: [phone] Mobile Phone: [phone] Relation: Son Secondary Emergency Contact: Hantz,[name]  [place] Phone: [phone] Relation: None   Goals of Care: Advanced Directive information Advanced Directives 03/06/2018  Does Patient Have a Medical Advance Directive? Yes  Type of Advance Directive Out of facility DNR (pink MOST or yellow form)  Does patient want to make changes to medical advance directive? No - Patient declined  Copy of [place] in Chart? -  Pre-existing out of facility DNR order (yellow form or pink MOST form) Yellow form placed in chart (order not valid for inpatient use)      Chief Complaint  Patient presents with  . Acute Visit    3 week follow up on blood pressure, leg edema, labs for review  . Medication Refill    No refills needed at this time.   Marland Kitchen Results    Discuss labs    HPI: Patient is a 84 y.o. female seen today for follow up from last visit. Her BP had been elevated and she was started on hydralazine on 25 mg bid. Also on amlodipine 10 mg daily and losartan 100 mg daily. BP reading reviewed and SBP 114-149 with one reading of 170/70. She continues to have chronic fatigue. Has anemia of chronic disease. Family has purchased a motorized wheelchair and would like formal evaluation by occupational therapy for training on using the power wheelchair. Last visit her diuretic was also increased to 40 mg bid from 20 mg bid and she was asked to wear compression stockings. Back pain persists. On tylenol 1000 mg tid with tramadol  25 mg at bedtime. Has not seen neurosurgery yet.   Past Medical History:  Diagnosis Date  . Anemia   . Anxiety   . Chronotropic incompetence 12/2012    potentially medication related; noted on CPET   . DDD (degenerative disc disease)   . Eczema   . GERD (gastroesophageal reflux disease)   . H/O hiatal hernia   . Hx: UTI (urinary tract infection)   . Hyperlipidemia   . Hypertension   . Hypothyroidism   . Osteopenia   . Septicemia ([place]) 2002   following UTI  . Vertigo, benign positional    Past Surgical History:  Procedure Laterality Date  . BREAST SURGERY     left biopsy  . CATARACT EXTRACTION Right    2 weeks ago  . CPET / MET - PFTS     Consistent with chronotropic incompetence; See attached report in the results section  . DILATION AND CURETTAGE OF UTERUS     x 2  . DOPPLER ECHOCARDIOGRAPHY  01/10/2013   Normal LV size and function. Normal EF. Air sclerosis but no stenosis  . ESOPHAGOGASTRODUODENOSCOPY  05/04/2012   Procedure: ESOPHAGOGASTRODUODENOSCOPY (EGD);  Surgeon: Inda Castle, [name];  Location: [facility];  Service: Endoscopy;  Laterality: N/A;  . EYE SURGERY     cataract extraction with ILO  ? eye  . IR KYPHO EA ADDL LEVEL THORACIC OR LUMBAR  10/28/2016  . IR KYPHO LUMBAR INC FX REDUCE BONE  BX UNI/BIL CANNULATION INC/IMAGING  10/28/2016  . IR KYPHO THORACIC WITH BONE BIOPSY  12/24/2016  . IR RADIOLOGIST EVAL & MGMT  11/11/2016  . KNEE ARTHROSCOPY  2011   right  . TONSILLECTOMY    . TOTAL KNEE ARTHROPLASTY  04/24/2012   Procedure: TOTAL KNEE ARTHROPLASTY;  Surgeon: Gearlean Alf, [name];  Location: [facility];  Service: Orthopedics;  Laterality: Right;    reports that she quit smoking about 52 years ago. Her smoking use included cigarettes. She quit after 0.00 years of use. She has never used smokeless tobacco. She reports that she does not drink alcohol or use drugs. Social History   Socioeconomic History  . Marital status: Widowed    Spouse name: [name]  . Number of  children: 3  . Years of education: Not on file  . Highest education level: Not on file  Occupational History  . Occupation: homemaker  Social Needs  . Financial resource strain: Not hard at all  . Food insecurity:    Worry: Never true    Inability: Never true  . Transportation needs:    Medical: No    Non-medical: No  Tobacco Use  . Smoking status: Former Smoker    Years: 0.00    Types: Cigarettes    Last attempt to quit: 05/03/1965    Years since quitting: 52.8  . Smokeless tobacco: Never Used  Substance and Sexual Activity  . Alcohol use: No    Comment: occ glass wine  . Drug use: No  . Sexual activity: Not on file  Lifestyle  . Physical activity:    Days per week: 7 days    Minutes per session: 10 min  . Stress: To some extent  Relationships  . Social connections:    Talks on phone: More than three times a week    Gets together: More than three times a week    Attends religious service: Never    Active member of club or organization: No    Attends meetings of clubs or organizations: Never    Relationship status: Widowed  . Intimate partner violence:    Fear of current or ex partner: No    Emotionally abused: No    Physically abused: No    Forced sexual activity: No  Other Topics Concern  . Not on file  Social History Narrative   She is a widowed mother of 3.   She is a former smoker, with a distant history having quit in 1966.   She does drink an occasional glass of red wine.   She is just now started to try doing exercise with water aerobics.    Functional Status Survey:    Family History  Problem Relation Age of Onset  . Lung cancer Father 59       Died at age 46  . Stroke Mother 67       Died in her late 3s.  Marland Kitchen Heart attack Mother 64  . Diabetes Mother   . Arthritis Mother     Health Maintenance  Topic Date Due  . DEXA SCAN  07/30/1997  . PNA vac Low Risk Adult (2 of 2 - PCV13) 12/21/2014  . TETANUS/TDAP  09/25/2016  . INFLUENZA VACCINE   02/23/2018    No Known Allergies  Outpatient Encounter Medications as of 03/06/2018  Medication Sig  . acetaminophen (TYLENOL) 500 MG tablet Take 2 tablets (1,000 mg total) by mouth 3 (three) times daily.  Marland Kitchen ALPRAZolam (XANAX) 0.25 MG tablet  Take 0.5 mg by mouth daily as needed for anxiety. Take half a tablet  . amLODipine (NORVASC) 10 MG tablet Take 1 tablet (10 mg total) by mouth daily.  Marland Kitchen buPROPion (WELLBUTRIN SR) 150 MG 12 hr tablet Take 1 tablet (150 mg total) by mouth 2 (two) times daily after a meal.  . calcium carbonate (OS-CAL) 600 MG TABS tablet Take 1 tablet (600 mg total) by mouth 2 (two) times daily with a meal.  . cholecalciferol (VITAMIN D) 1000 UNITS tablet Take 1,000 Units by mouth daily.  Marland Kitchen docusate sodium (COLACE) 100 MG capsule Take 100 mg by mouth 2 (two) times daily.  . fish oil-omega-3 fatty acids 1000 MG capsule Take 1 g by mouth daily.  . furosemide (LASIX) 40 MG tablet Take 1 pill in morning at 7 am and half a pill at 3 pm  . hydrALAZINE (APRESOLINE) 25 MG tablet Take 1 tablet (25 mg total) by mouth 2 (two) times daily.  Marland Kitchen levothyroxine (SYNTHROID, LEVOTHROID) 75 MCG tablet Take 1 tablet (75 mcg total) by mouth daily before breakfast.  . loperamide (IMODIUM A-D) 2 MG tablet Take 2 mg by mouth as needed for diarrhea or loose stools.  Marland Kitchen losartan (COZAAR) 100 MG tablet Take 1 tablet (100 mg total) by mouth daily.  . Multiple Vitamin (MULTIVITAMIN WITH MINERALS) TABS tablet Take 1 tablet by mouth daily.  . nitrofurantoin (MACRODANTIN) 50 MG capsule Take 1 capsule (50 mg total) by mouth daily.  . NON FORMULARY Take 12,600 mg by mouth daily as needed (Take 1 capsule). Triple Strength Cranberry  . omeprazole (PRILOSEC) 40 MG capsule Take 1 capsule (40 mg total) by mouth daily.  . sennosides-docusate sodium (SENOKOT-S) 8.6-50 MG tablet Take 2 tablets by mouth at bedtime.  . traMADol (ULTRAM) 50 MG tablet Take 0.5 tablets (25 mg total) by mouth at bedtime.  . TYMLOS 3120  MCG/1.56ML SOPN INJECT 80MCG SUBCUTANEOUSLY ONCE DAILY  . zinc oxide 20 % ointment Apply 1 application topically as needed for irritation.  . [DISCONTINUED] furosemide (LASIX) 40 MG tablet Take 1 tablet (40 mg total) by mouth 2 (two) times daily.  . [DISCONTINUED] potassium chloride SA (K-DUR,KLOR-CON) 20 MEQ tablet Take 1 tablet (20 mEq total) by mouth 2 (two) times daily.  . potassium chloride (K-DUR) 10 MEQ tablet Take 2 tablet at 7 am and 1 tablet at 3 pm   No facility-administered encounter medications on file as of 03/06/2018.     Review of Systems  Constitutional: Positive for fatigue. Negative for appetite change, chills and fever.  HENT: Negative for congestion and mouth sores.   Respiratory: Positive for shortness of breath. Negative for cough and wheezing.        Has dyspnea mainly with exertion, now some at rest as well, sleeps in her recliner  Cardiovascular: Positive for leg swelling. Negative for chest pain and palpitations.       Leg edema had improved with lasix and ted stockings, has not worn ted stockings today, daughter feels that she had multiple visitors over the weekend and was not keeping her legs propped up in her recliner  Gastrointestinal: Negative for abdominal pain and diarrhea.  Genitourinary: Positive for frequency.  Musculoskeletal: Positive for back pain and gait problem.  Neurological: Positive for weakness. Negative for dizziness and headaches.  Psychiatric/Behavioral: Positive for dysphoric mood. The patient is nervous/anxious.     Vitals:   03/06/18 1326  BP: 134/60  Pulse: 85  Resp: 20  Temp: 97.7 F (36.5 C)  TempSrc: Oral  SpO2: 98%  Weight: 167 lb (75.8 kg)  Height: _0  (1.626 m)   Body mass index is 28.67 kg/m.   Wt Readings from Last 3 Encounters:  03/06/18 167 lb (75.8 kg)  02/08/18 168 lb 6.4 oz (76.4 kg)  01/25/18 170 lb (77.1 kg)   Physical Exam  Constitutional: She is oriented to person, place, and time.  Overweight, frail  elderly female  HENT:  Head: Normocephalic and atraumatic.  Mouth/Throat: Oropharynx is clear and moist.  Eyes: Conjunctivae are normal. Right eye exhibits no discharge. Left eye exhibits no discharge.  Cardiovascular: Normal rate and regular rhythm.  Pulmonary/Chest: Effort normal. No respiratory distress. She has no wheezes. She has no rales.  Poor air movement  Abdominal: Soft. Bowel sounds are normal. There is no tenderness.  Musculoskeletal: She exhibits edema.  No spinal tenderness, can move all 4 extremities, unsteady gait and using walker  Neurological: She is alert and oriented to person, place, and time.  Skin: Skin is warm and dry. She is not diaphoretic.  Psychiatric:  tearful    Labs reviewed: Basic Metabolic Panel: Recent Labs    01/16/18 0356 01/23/18 02/06/18 0745 02/27/18 0000  NA 135 134* 134* 136  K 4.3 4.3 3.8 4.4  CL 106 97 95* 98  CO2 _1 GLUCOSE 103*  --  98 104*  BUN 21* 24* 22 33*  CREATININE 0.56 0.7 0.90* 1.07*  CALCIUM 8.6* 9.6 10.1 10.3   Liver Function Tests: Recent Labs    10/25/17 1446  01/14/18 0928 01/23/18 02/06/18 0745 02/27/18 0000  AST 30   < > 60* _2 ALT 22   < > _3 ALKPHOS 99  --  85 84  --   --   BILITOT 0.3   < > 0.8  --  0.4 0.4  PROT 7.4   < > 7.9 6.6 7.4 8.2*  ALBUMIN 3.9  --  4.3 3.9  --   --    < > = values in this interval not displayed.   No results for input(s): LIPASE, AMYLASE in the last 8760 hours. No results for input(s): AMMONIA in the last 8760 hours. CBC: Recent Labs    08/25/17 0845 10/25/17 1446  01/14/18 0928  01/16/18 0356 01/23/18 02/06/18 0745 02/27/18 0000  WBC 3.4* 5.2   < > 10.2   < > 5.1 4.3 4.5 4.7  NEUTROABS 1,992 3.1  --  7.9*  --   --   --   --   --   HGB 10.4* 10.7*   < > 12.3   < > 9.7* 10.1* 10.9* 11.9  HCT 31.6* 32.8*   < > 37.3   < > 29.8* 30* 32.0* 36.3  MCV 82.9 87.9   < > 88.0   < > 89.5  --  85.8 87.1  PLT 97* 84*   < > 98*   < > 75* 106* 77* 79*    < > = values in this interval not displayed.   Cardiac Enzymes: Recent Labs    01/14/18 0928 01/16/18 0356  CKTOTAL 1,707* 621*   BNP: Invalid input(s): POCBNP No results found for: HGBA1C Lab Results  Component Value Date   TSH 2.63 12/09/2017   Lab Results  Component Value Date   VITAMINB12 1,333 (H) 10/25/2017   Lab Results  Component Value Date   FOLATE 23.0 10/25/2017   Lab Results  Component Value Date  IRON 94 10/25/2017   TIBC 366 10/25/2017   FERRITIN 102 10/25/2017    Lipid Panel: Recent Labs    06/23/17 0810 12/09/17 0000  CHOL 205* 212*  HDL 95 72  LDLCALC 95 121*  TRIG 63 88  CHOLHDL 2.2 2.9   No results found for: HGBA1C  Procedures since last visit: No results found.  Assessment/Plan  1. Hypertensive heart disease with chronic diastolic congestive heart failure (HCC) Improved BP reading. Continue hydralazine 25 mg bid with current regimen of amlodipine and losartan. If renal function worsens, consider changing her losartan. Changes to furosemide as below.  - furosemide (LASIX) 40 MG tablet; Take 1 pill in morning at 7 am and half a pill at 3 pm  Dispense: 90 tablet; Refill: 3 - potassium chloride (K-DUR) 10 MEQ tablet; Take 2 tablet at 7 am and 1 tablet at 3 pm  Dispense: 90 tablet; Refill: 3 - CMP with eGFR([facility]); Future - ECHOCARDIOGRAM COMPLETE; Future  2. PVD (peripheral vascular disease) ([place]) Improved from last visit. With worsening renal function, decrease lasix to 40 mg am and 20 mg pm from 40 mg bid. Change kcl from 20 meq bid to 20 meq am and 10 meq pm. Advised to wear compression stockings.  - furosemide (LASIX) 40 MG tablet; Take 1 pill in morning at 7 am and half a pill at 3 pm  Dispense: 90 tablet; Refill: 3 - potassium chloride (K-DUR) 10 MEQ tablet; Take 2 tablet at 7 am and 1 tablet at 3 pm  Dispense: 90 tablet; Refill: 3  3. Impaired renal function Adjust dosing of furosemide as above, maintain hydration and BP.  - CMP  with eGFR([facility]); Future  4. Shortness of breath Progressive, has leg edema and poor air movement but no acute changes from before. Reviewed echocardiogram from 2014. Obtain echocardiogram. Reviewed CT chest from 2019 showing mild fibrosis changes. Her fibrosis, deconditioning and CHF could all be contributing to her dyspnea.  - ECHOCARDIOGRAM COMPLETE; Future  5. Chronic bilateral low back pain without sciatica Continue tylenol and tramadol current regimen, advised to see neurosurgery, declines PT consult. Obtain OT consult to evaluate and train for use of motorized wheelchair. Script with OT order provided.   6. Pancreatic cyst Incidental pancreatic cyst noted in CT imaging 6/19. MRI in 6 months from 6/22 to further characterize it, to schedule it in her next follow up visit.     Labs/tests ordered:   Lab Orders     CMP with eGFR([facility])   Next appointment: 3 months or earlier if needed  Communication: reviewed care plan with patient and her family     Blanchie Serve, [name] [place], [place] [address] Cell Phone (Monday-Friday 8 am - 5 pm): [phone] On Call: [phone] and follow prompts after 5 pm and on weekends Office Phone: [phone] Office Fax: [phone]

## 2018-03-06 NOTE — Patient Instructions (Signed)
  I want you to decrease your fluid pill furosemide and potassium supplement as discussed in office and printed out in your medication list.   I want you to see your neurosurgeon for your back.  You should get a call to help set up appointment for ultrasound of your heart for ongoing shortness of breath.

## 2018-03-07 ENCOUNTER — Telehealth: Payer: Self-pay | Admitting: *Deleted

## 2018-03-07 ENCOUNTER — Telehealth (HOSPITAL_COMMUNITY): Payer: Self-pay

## 2018-03-07 DIAGNOSIS — K219 Gastro-esophageal reflux disease without esophagitis: Secondary | ICD-10-CM | POA: Diagnosis not present

## 2018-03-07 DIAGNOSIS — Z0000 Encounter for general adult medical examination without abnormal findings: Secondary | ICD-10-CM | POA: Diagnosis not present

## 2018-03-07 DIAGNOSIS — M810 Age-related osteoporosis without current pathological fracture: Secondary | ICD-10-CM | POA: Diagnosis not present

## 2018-03-07 DIAGNOSIS — M8580 Other specified disorders of bone density and structure, unspecified site: Secondary | ICD-10-CM | POA: Diagnosis not present

## 2018-03-07 DIAGNOSIS — Z008 Encounter for other general examination: Secondary | ICD-10-CM | POA: Diagnosis not present

## 2018-03-07 DIAGNOSIS — Z23 Encounter for immunization: Secondary | ICD-10-CM | POA: Diagnosis not present

## 2018-03-07 DIAGNOSIS — M6281 Muscle weakness (generalized): Secondary | ICD-10-CM | POA: Diagnosis not present

## 2018-03-07 DIAGNOSIS — M5380 Other specified dorsopathies, site unspecified: Secondary | ICD-10-CM | POA: Diagnosis not present

## 2018-03-07 DIAGNOSIS — M545 Low back pain: Secondary | ICD-10-CM | POA: Diagnosis not present

## 2018-03-07 DIAGNOSIS — M546 Pain in thoracic spine: Secondary | ICD-10-CM | POA: Diagnosis not present

## 2018-03-07 DIAGNOSIS — R0600 Dyspnea, unspecified: Secondary | ICD-10-CM | POA: Diagnosis not present

## 2018-03-07 DIAGNOSIS — M1990 Unspecified osteoarthritis, unspecified site: Secondary | ICD-10-CM | POA: Diagnosis not present

## 2018-03-07 DIAGNOSIS — N390 Urinary tract infection, site not specified: Secondary | ICD-10-CM | POA: Diagnosis not present

## 2018-03-07 DIAGNOSIS — R0781 Pleurodynia: Secondary | ICD-10-CM | POA: Diagnosis not present

## 2018-03-07 DIAGNOSIS — W19XXXA Unspecified fall, initial encounter: Secondary | ICD-10-CM | POA: Diagnosis not present

## 2018-03-07 DIAGNOSIS — Z1389 Encounter for screening for other disorder: Secondary | ICD-10-CM | POA: Diagnosis not present

## 2018-03-07 DIAGNOSIS — Z8719 Personal history of other diseases of the digestive system: Secondary | ICD-10-CM | POA: Diagnosis not present

## 2018-03-07 DIAGNOSIS — R2681 Unsteadiness on feet: Secondary | ICD-10-CM | POA: Diagnosis not present

## 2018-03-07 DIAGNOSIS — R5381 Other malaise: Secondary | ICD-10-CM | POA: Diagnosis not present

## 2018-03-07 DIAGNOSIS — Z9181 History of falling: Secondary | ICD-10-CM | POA: Diagnosis not present

## 2018-03-07 DIAGNOSIS — R269 Unspecified abnormalities of gait and mobility: Secondary | ICD-10-CM | POA: Diagnosis not present

## 2018-03-07 NOTE — Telephone Encounter (Signed)
Echo scheduled for 03/13/18 at [facility]2PM. Message left informing DIL of info due to patient requesting that I notify her. [facility] [address] No special instructions.

## 2018-03-07 NOTE — Telephone Encounter (Signed)
Called pt's daughter to inform her that Dr. Corliss Skainseveshwar took a look at the pt's recent ct scan and she does not have any acute fractures. Told to f/u with PCP for pain management. Daughter agreed with plan. AW

## 2018-03-08 ENCOUNTER — Encounter: Payer: Self-pay | Admitting: Internal Medicine

## 2018-03-08 DIAGNOSIS — K219 Gastro-esophageal reflux disease without esophagitis: Secondary | ICD-10-CM | POA: Diagnosis not present

## 2018-03-08 DIAGNOSIS — Z1389 Encounter for screening for other disorder: Secondary | ICD-10-CM | POA: Diagnosis not present

## 2018-03-08 DIAGNOSIS — Z23 Encounter for immunization: Secondary | ICD-10-CM | POA: Diagnosis not present

## 2018-03-08 DIAGNOSIS — R5381 Other malaise: Secondary | ICD-10-CM | POA: Diagnosis not present

## 2018-03-08 DIAGNOSIS — Z0000 Encounter for general adult medical examination without abnormal findings: Secondary | ICD-10-CM | POA: Diagnosis not present

## 2018-03-08 DIAGNOSIS — N390 Urinary tract infection, site not specified: Secondary | ICD-10-CM | POA: Diagnosis not present

## 2018-03-08 DIAGNOSIS — R0781 Pleurodynia: Secondary | ICD-10-CM | POA: Diagnosis not present

## 2018-03-08 DIAGNOSIS — R2681 Unsteadiness on feet: Secondary | ICD-10-CM | POA: Diagnosis not present

## 2018-03-08 DIAGNOSIS — M6281 Muscle weakness (generalized): Secondary | ICD-10-CM | POA: Diagnosis not present

## 2018-03-08 DIAGNOSIS — M810 Age-related osteoporosis without current pathological fracture: Secondary | ICD-10-CM | POA: Diagnosis not present

## 2018-03-08 DIAGNOSIS — W19XXXA Unspecified fall, initial encounter: Secondary | ICD-10-CM | POA: Diagnosis not present

## 2018-03-08 DIAGNOSIS — M1990 Unspecified osteoarthritis, unspecified site: Secondary | ICD-10-CM | POA: Diagnosis not present

## 2018-03-08 DIAGNOSIS — Z9181 History of falling: Secondary | ICD-10-CM | POA: Diagnosis not present

## 2018-03-08 DIAGNOSIS — M8580 Other specified disorders of bone density and structure, unspecified site: Secondary | ICD-10-CM | POA: Diagnosis not present

## 2018-03-08 DIAGNOSIS — M546 Pain in thoracic spine: Secondary | ICD-10-CM | POA: Diagnosis not present

## 2018-03-08 DIAGNOSIS — Z008 Encounter for other general examination: Secondary | ICD-10-CM | POA: Diagnosis not present

## 2018-03-08 DIAGNOSIS — M545 Low back pain: Secondary | ICD-10-CM | POA: Diagnosis not present

## 2018-03-08 DIAGNOSIS — R269 Unspecified abnormalities of gait and mobility: Secondary | ICD-10-CM | POA: Diagnosis not present

## 2018-03-08 DIAGNOSIS — Z8719 Personal history of other diseases of the digestive system: Secondary | ICD-10-CM | POA: Diagnosis not present

## 2018-03-08 DIAGNOSIS — R0600 Dyspnea, unspecified: Secondary | ICD-10-CM | POA: Diagnosis not present

## 2018-03-08 DIAGNOSIS — M5380 Other specified dorsopathies, site unspecified: Secondary | ICD-10-CM | POA: Diagnosis not present

## 2018-03-09 DIAGNOSIS — K219 Gastro-esophageal reflux disease without esophagitis: Secondary | ICD-10-CM | POA: Diagnosis not present

## 2018-03-09 DIAGNOSIS — Z23 Encounter for immunization: Secondary | ICD-10-CM | POA: Diagnosis not present

## 2018-03-09 DIAGNOSIS — Z008 Encounter for other general examination: Secondary | ICD-10-CM | POA: Diagnosis not present

## 2018-03-09 DIAGNOSIS — M545 Low back pain: Secondary | ICD-10-CM | POA: Diagnosis not present

## 2018-03-09 DIAGNOSIS — W19XXXA Unspecified fall, initial encounter: Secondary | ICD-10-CM | POA: Diagnosis not present

## 2018-03-09 DIAGNOSIS — R5381 Other malaise: Secondary | ICD-10-CM | POA: Diagnosis not present

## 2018-03-09 DIAGNOSIS — Z8719 Personal history of other diseases of the digestive system: Secondary | ICD-10-CM | POA: Diagnosis not present

## 2018-03-09 DIAGNOSIS — M546 Pain in thoracic spine: Secondary | ICD-10-CM | POA: Diagnosis not present

## 2018-03-09 DIAGNOSIS — R0600 Dyspnea, unspecified: Secondary | ICD-10-CM | POA: Diagnosis not present

## 2018-03-09 DIAGNOSIS — Z1389 Encounter for screening for other disorder: Secondary | ICD-10-CM | POA: Diagnosis not present

## 2018-03-09 DIAGNOSIS — R0781 Pleurodynia: Secondary | ICD-10-CM | POA: Diagnosis not present

## 2018-03-09 DIAGNOSIS — M8580 Other specified disorders of bone density and structure, unspecified site: Secondary | ICD-10-CM | POA: Diagnosis not present

## 2018-03-09 DIAGNOSIS — Z9181 History of falling: Secondary | ICD-10-CM | POA: Diagnosis not present

## 2018-03-09 DIAGNOSIS — Z0000 Encounter for general adult medical examination without abnormal findings: Secondary | ICD-10-CM | POA: Diagnosis not present

## 2018-03-09 DIAGNOSIS — M5380 Other specified dorsopathies, site unspecified: Secondary | ICD-10-CM | POA: Diagnosis not present

## 2018-03-09 DIAGNOSIS — M1990 Unspecified osteoarthritis, unspecified site: Secondary | ICD-10-CM | POA: Diagnosis not present

## 2018-03-09 DIAGNOSIS — R2681 Unsteadiness on feet: Secondary | ICD-10-CM | POA: Diagnosis not present

## 2018-03-09 DIAGNOSIS — R269 Unspecified abnormalities of gait and mobility: Secondary | ICD-10-CM | POA: Diagnosis not present

## 2018-03-09 DIAGNOSIS — M6281 Muscle weakness (generalized): Secondary | ICD-10-CM | POA: Diagnosis not present

## 2018-03-09 DIAGNOSIS — N390 Urinary tract infection, site not specified: Secondary | ICD-10-CM | POA: Diagnosis not present

## 2018-03-09 DIAGNOSIS — M810 Age-related osteoporosis without current pathological fracture: Secondary | ICD-10-CM | POA: Diagnosis not present

## 2018-03-10 DIAGNOSIS — W19XXXA Unspecified fall, initial encounter: Secondary | ICD-10-CM | POA: Diagnosis not present

## 2018-03-10 DIAGNOSIS — K219 Gastro-esophageal reflux disease without esophagitis: Secondary | ICD-10-CM | POA: Diagnosis not present

## 2018-03-10 DIAGNOSIS — R2681 Unsteadiness on feet: Secondary | ICD-10-CM | POA: Diagnosis not present

## 2018-03-10 DIAGNOSIS — R0600 Dyspnea, unspecified: Secondary | ICD-10-CM | POA: Diagnosis not present

## 2018-03-10 DIAGNOSIS — Z008 Encounter for other general examination: Secondary | ICD-10-CM | POA: Diagnosis not present

## 2018-03-10 DIAGNOSIS — M5380 Other specified dorsopathies, site unspecified: Secondary | ICD-10-CM | POA: Diagnosis not present

## 2018-03-10 DIAGNOSIS — Z9181 History of falling: Secondary | ICD-10-CM | POA: Diagnosis not present

## 2018-03-10 DIAGNOSIS — M1990 Unspecified osteoarthritis, unspecified site: Secondary | ICD-10-CM | POA: Diagnosis not present

## 2018-03-10 DIAGNOSIS — M545 Low back pain: Secondary | ICD-10-CM | POA: Diagnosis not present

## 2018-03-10 DIAGNOSIS — M6281 Muscle weakness (generalized): Secondary | ICD-10-CM | POA: Diagnosis not present

## 2018-03-10 DIAGNOSIS — Z23 Encounter for immunization: Secondary | ICD-10-CM | POA: Diagnosis not present

## 2018-03-10 DIAGNOSIS — Z1389 Encounter for screening for other disorder: Secondary | ICD-10-CM | POA: Diagnosis not present

## 2018-03-10 DIAGNOSIS — Z8719 Personal history of other diseases of the digestive system: Secondary | ICD-10-CM | POA: Diagnosis not present

## 2018-03-10 DIAGNOSIS — R5381 Other malaise: Secondary | ICD-10-CM | POA: Diagnosis not present

## 2018-03-10 DIAGNOSIS — R0781 Pleurodynia: Secondary | ICD-10-CM | POA: Diagnosis not present

## 2018-03-10 DIAGNOSIS — M546 Pain in thoracic spine: Secondary | ICD-10-CM | POA: Diagnosis not present

## 2018-03-10 DIAGNOSIS — N390 Urinary tract infection, site not specified: Secondary | ICD-10-CM | POA: Diagnosis not present

## 2018-03-10 DIAGNOSIS — Z0000 Encounter for general adult medical examination without abnormal findings: Secondary | ICD-10-CM | POA: Diagnosis not present

## 2018-03-10 DIAGNOSIS — M810 Age-related osteoporosis without current pathological fracture: Secondary | ICD-10-CM | POA: Diagnosis not present

## 2018-03-10 DIAGNOSIS — R269 Unspecified abnormalities of gait and mobility: Secondary | ICD-10-CM | POA: Diagnosis not present

## 2018-03-10 DIAGNOSIS — M8580 Other specified disorders of bone density and structure, unspecified site: Secondary | ICD-10-CM | POA: Diagnosis not present

## 2018-03-13 ENCOUNTER — Other Ambulatory Visit (HOSPITAL_COMMUNITY): Payer: Medicare Other

## 2018-03-15 ENCOUNTER — Other Ambulatory Visit: Payer: Self-pay | Admitting: Internal Medicine

## 2018-03-15 DIAGNOSIS — I5032 Chronic diastolic (congestive) heart failure: Principal | ICD-10-CM

## 2018-03-15 DIAGNOSIS — I110 Hypertensive heart disease with heart failure: Secondary | ICD-10-CM

## 2018-03-16 ENCOUNTER — Ambulatory Visit (HOSPITAL_COMMUNITY): Payer: Medicare Other

## 2018-03-16 DIAGNOSIS — M5380 Other specified dorsopathies, site unspecified: Secondary | ICD-10-CM | POA: Diagnosis not present

## 2018-03-16 DIAGNOSIS — Z0000 Encounter for general adult medical examination without abnormal findings: Secondary | ICD-10-CM | POA: Diagnosis not present

## 2018-03-16 DIAGNOSIS — W19XXXA Unspecified fall, initial encounter: Secondary | ICD-10-CM | POA: Diagnosis not present

## 2018-03-16 DIAGNOSIS — M546 Pain in thoracic spine: Secondary | ICD-10-CM | POA: Diagnosis not present

## 2018-03-16 DIAGNOSIS — R5381 Other malaise: Secondary | ICD-10-CM | POA: Diagnosis not present

## 2018-03-16 DIAGNOSIS — K219 Gastro-esophageal reflux disease without esophagitis: Secondary | ICD-10-CM | POA: Diagnosis not present

## 2018-03-16 DIAGNOSIS — R0781 Pleurodynia: Secondary | ICD-10-CM | POA: Diagnosis not present

## 2018-03-16 DIAGNOSIS — M545 Low back pain: Secondary | ICD-10-CM | POA: Diagnosis not present

## 2018-03-16 DIAGNOSIS — M6281 Muscle weakness (generalized): Secondary | ICD-10-CM | POA: Diagnosis not present

## 2018-03-16 DIAGNOSIS — Z1389 Encounter for screening for other disorder: Secondary | ICD-10-CM | POA: Diagnosis not present

## 2018-03-16 DIAGNOSIS — M1990 Unspecified osteoarthritis, unspecified site: Secondary | ICD-10-CM | POA: Diagnosis not present

## 2018-03-16 DIAGNOSIS — R0600 Dyspnea, unspecified: Secondary | ICD-10-CM | POA: Diagnosis not present

## 2018-03-16 DIAGNOSIS — R269 Unspecified abnormalities of gait and mobility: Secondary | ICD-10-CM | POA: Diagnosis not present

## 2018-03-16 DIAGNOSIS — Z23 Encounter for immunization: Secondary | ICD-10-CM | POA: Diagnosis not present

## 2018-03-16 DIAGNOSIS — Z8719 Personal history of other diseases of the digestive system: Secondary | ICD-10-CM | POA: Diagnosis not present

## 2018-03-16 DIAGNOSIS — M8580 Other specified disorders of bone density and structure, unspecified site: Secondary | ICD-10-CM | POA: Diagnosis not present

## 2018-03-16 DIAGNOSIS — M810 Age-related osteoporosis without current pathological fracture: Secondary | ICD-10-CM | POA: Diagnosis not present

## 2018-03-16 DIAGNOSIS — Z9181 History of falling: Secondary | ICD-10-CM | POA: Diagnosis not present

## 2018-03-16 DIAGNOSIS — Z008 Encounter for other general examination: Secondary | ICD-10-CM | POA: Diagnosis not present

## 2018-03-16 DIAGNOSIS — N390 Urinary tract infection, site not specified: Secondary | ICD-10-CM | POA: Diagnosis not present

## 2018-03-16 DIAGNOSIS — R2681 Unsteadiness on feet: Secondary | ICD-10-CM | POA: Diagnosis not present

## 2018-03-20 DIAGNOSIS — M545 Low back pain: Secondary | ICD-10-CM | POA: Diagnosis not present

## 2018-03-20 DIAGNOSIS — Z23 Encounter for immunization: Secondary | ICD-10-CM | POA: Diagnosis not present

## 2018-03-20 DIAGNOSIS — Z8719 Personal history of other diseases of the digestive system: Secondary | ICD-10-CM | POA: Diagnosis not present

## 2018-03-20 DIAGNOSIS — Z9181 History of falling: Secondary | ICD-10-CM | POA: Diagnosis not present

## 2018-03-20 DIAGNOSIS — N390 Urinary tract infection, site not specified: Secondary | ICD-10-CM | POA: Diagnosis not present

## 2018-03-20 DIAGNOSIS — Z0000 Encounter for general adult medical examination without abnormal findings: Secondary | ICD-10-CM | POA: Diagnosis not present

## 2018-03-20 DIAGNOSIS — M6281 Muscle weakness (generalized): Secondary | ICD-10-CM | POA: Diagnosis not present

## 2018-03-20 DIAGNOSIS — R269 Unspecified abnormalities of gait and mobility: Secondary | ICD-10-CM | POA: Diagnosis not present

## 2018-03-20 DIAGNOSIS — R0600 Dyspnea, unspecified: Secondary | ICD-10-CM | POA: Diagnosis not present

## 2018-03-20 DIAGNOSIS — R0781 Pleurodynia: Secondary | ICD-10-CM | POA: Diagnosis not present

## 2018-03-20 DIAGNOSIS — Z1389 Encounter for screening for other disorder: Secondary | ICD-10-CM | POA: Diagnosis not present

## 2018-03-20 DIAGNOSIS — M5380 Other specified dorsopathies, site unspecified: Secondary | ICD-10-CM | POA: Diagnosis not present

## 2018-03-20 DIAGNOSIS — M810 Age-related osteoporosis without current pathological fracture: Secondary | ICD-10-CM | POA: Diagnosis not present

## 2018-03-20 DIAGNOSIS — M1990 Unspecified osteoarthritis, unspecified site: Secondary | ICD-10-CM | POA: Diagnosis not present

## 2018-03-20 DIAGNOSIS — K219 Gastro-esophageal reflux disease without esophagitis: Secondary | ICD-10-CM | POA: Diagnosis not present

## 2018-03-20 DIAGNOSIS — W19XXXA Unspecified fall, initial encounter: Secondary | ICD-10-CM | POA: Diagnosis not present

## 2018-03-20 DIAGNOSIS — M546 Pain in thoracic spine: Secondary | ICD-10-CM | POA: Diagnosis not present

## 2018-03-20 DIAGNOSIS — R2681 Unsteadiness on feet: Secondary | ICD-10-CM | POA: Diagnosis not present

## 2018-03-20 DIAGNOSIS — Z008 Encounter for other general examination: Secondary | ICD-10-CM | POA: Diagnosis not present

## 2018-03-20 DIAGNOSIS — M8580 Other specified disorders of bone density and structure, unspecified site: Secondary | ICD-10-CM | POA: Diagnosis not present

## 2018-03-20 DIAGNOSIS — R5381 Other malaise: Secondary | ICD-10-CM | POA: Diagnosis not present

## 2018-03-21 ENCOUNTER — Other Ambulatory Visit: Payer: Self-pay | Admitting: *Deleted

## 2018-03-21 DIAGNOSIS — E039 Hypothyroidism, unspecified: Secondary | ICD-10-CM

## 2018-03-21 DIAGNOSIS — Z008 Encounter for other general examination: Secondary | ICD-10-CM | POA: Diagnosis not present

## 2018-03-21 DIAGNOSIS — M545 Low back pain: Secondary | ICD-10-CM | POA: Diagnosis not present

## 2018-03-21 DIAGNOSIS — Z9181 History of falling: Secondary | ICD-10-CM | POA: Diagnosis not present

## 2018-03-21 DIAGNOSIS — R5381 Other malaise: Secondary | ICD-10-CM | POA: Diagnosis not present

## 2018-03-21 DIAGNOSIS — Z1389 Encounter for screening for other disorder: Secondary | ICD-10-CM | POA: Diagnosis not present

## 2018-03-21 DIAGNOSIS — R0600 Dyspnea, unspecified: Secondary | ICD-10-CM | POA: Diagnosis not present

## 2018-03-21 DIAGNOSIS — W19XXXA Unspecified fall, initial encounter: Secondary | ICD-10-CM | POA: Diagnosis not present

## 2018-03-21 DIAGNOSIS — Z0000 Encounter for general adult medical examination without abnormal findings: Secondary | ICD-10-CM | POA: Diagnosis not present

## 2018-03-21 DIAGNOSIS — Z23 Encounter for immunization: Secondary | ICD-10-CM | POA: Diagnosis not present

## 2018-03-21 DIAGNOSIS — N390 Urinary tract infection, site not specified: Secondary | ICD-10-CM | POA: Diagnosis not present

## 2018-03-21 DIAGNOSIS — R269 Unspecified abnormalities of gait and mobility: Secondary | ICD-10-CM | POA: Diagnosis not present

## 2018-03-21 DIAGNOSIS — M1990 Unspecified osteoarthritis, unspecified site: Secondary | ICD-10-CM | POA: Diagnosis not present

## 2018-03-21 DIAGNOSIS — R2681 Unsteadiness on feet: Secondary | ICD-10-CM | POA: Diagnosis not present

## 2018-03-21 DIAGNOSIS — M810 Age-related osteoporosis without current pathological fracture: Secondary | ICD-10-CM | POA: Diagnosis not present

## 2018-03-21 DIAGNOSIS — M6281 Muscle weakness (generalized): Secondary | ICD-10-CM | POA: Diagnosis not present

## 2018-03-21 DIAGNOSIS — K219 Gastro-esophageal reflux disease without esophagitis: Secondary | ICD-10-CM | POA: Diagnosis not present

## 2018-03-21 DIAGNOSIS — M546 Pain in thoracic spine: Secondary | ICD-10-CM | POA: Diagnosis not present

## 2018-03-21 DIAGNOSIS — R0781 Pleurodynia: Secondary | ICD-10-CM | POA: Diagnosis not present

## 2018-03-21 DIAGNOSIS — Z8719 Personal history of other diseases of the digestive system: Secondary | ICD-10-CM | POA: Diagnosis not present

## 2018-03-21 DIAGNOSIS — M5380 Other specified dorsopathies, site unspecified: Secondary | ICD-10-CM | POA: Diagnosis not present

## 2018-03-21 DIAGNOSIS — M8580 Other specified disorders of bone density and structure, unspecified site: Secondary | ICD-10-CM | POA: Diagnosis not present

## 2018-03-21 MED ORDER — LEVOTHYROXINE SODIUM 75 MCG PO TABS: 75.0000 ug | ORAL_TABLET | Freq: Every day | ORAL | 0 refills | Status: DC

## 2018-03-21 NOTE — Telephone Encounter (Signed)
Patient requested refill to local pharmacy. Faxed.

## 2018-03-22 DIAGNOSIS — N390 Urinary tract infection, site not specified: Secondary | ICD-10-CM | POA: Diagnosis not present

## 2018-03-22 DIAGNOSIS — Z8719 Personal history of other diseases of the digestive system: Secondary | ICD-10-CM | POA: Diagnosis not present

## 2018-03-22 DIAGNOSIS — Z9181 History of falling: Secondary | ICD-10-CM | POA: Diagnosis not present

## 2018-03-22 DIAGNOSIS — Z008 Encounter for other general examination: Secondary | ICD-10-CM | POA: Diagnosis not present

## 2018-03-22 DIAGNOSIS — W19XXXA Unspecified fall, initial encounter: Secondary | ICD-10-CM | POA: Diagnosis not present

## 2018-03-22 DIAGNOSIS — R2681 Unsteadiness on feet: Secondary | ICD-10-CM | POA: Diagnosis not present

## 2018-03-22 DIAGNOSIS — Z0000 Encounter for general adult medical examination without abnormal findings: Secondary | ICD-10-CM | POA: Diagnosis not present

## 2018-03-22 DIAGNOSIS — Z1389 Encounter for screening for other disorder: Secondary | ICD-10-CM | POA: Diagnosis not present

## 2018-03-22 DIAGNOSIS — M6281 Muscle weakness (generalized): Secondary | ICD-10-CM | POA: Diagnosis not present

## 2018-03-22 DIAGNOSIS — M8580 Other specified disorders of bone density and structure, unspecified site: Secondary | ICD-10-CM | POA: Diagnosis not present

## 2018-03-22 DIAGNOSIS — M545 Low back pain: Secondary | ICD-10-CM | POA: Diagnosis not present

## 2018-03-22 DIAGNOSIS — R269 Unspecified abnormalities of gait and mobility: Secondary | ICD-10-CM | POA: Diagnosis not present

## 2018-03-22 DIAGNOSIS — M546 Pain in thoracic spine: Secondary | ICD-10-CM | POA: Diagnosis not present

## 2018-03-22 DIAGNOSIS — M810 Age-related osteoporosis without current pathological fracture: Secondary | ICD-10-CM | POA: Diagnosis not present

## 2018-03-22 DIAGNOSIS — R5381 Other malaise: Secondary | ICD-10-CM | POA: Diagnosis not present

## 2018-03-22 DIAGNOSIS — K219 Gastro-esophageal reflux disease without esophagitis: Secondary | ICD-10-CM | POA: Diagnosis not present

## 2018-03-22 DIAGNOSIS — M1990 Unspecified osteoarthritis, unspecified site: Secondary | ICD-10-CM | POA: Diagnosis not present

## 2018-03-22 DIAGNOSIS — Z23 Encounter for immunization: Secondary | ICD-10-CM | POA: Diagnosis not present

## 2018-03-22 DIAGNOSIS — R0781 Pleurodynia: Secondary | ICD-10-CM | POA: Diagnosis not present

## 2018-03-22 DIAGNOSIS — R0600 Dyspnea, unspecified: Secondary | ICD-10-CM | POA: Diagnosis not present

## 2018-03-22 DIAGNOSIS — M5380 Other specified dorsopathies, site unspecified: Secondary | ICD-10-CM | POA: Diagnosis not present

## 2018-03-23 DIAGNOSIS — M545 Low back pain: Secondary | ICD-10-CM | POA: Diagnosis not present

## 2018-03-23 DIAGNOSIS — R2681 Unsteadiness on feet: Secondary | ICD-10-CM | POA: Diagnosis not present

## 2018-03-23 DIAGNOSIS — Z1389 Encounter for screening for other disorder: Secondary | ICD-10-CM | POA: Diagnosis not present

## 2018-03-23 DIAGNOSIS — R0781 Pleurodynia: Secondary | ICD-10-CM | POA: Diagnosis not present

## 2018-03-23 DIAGNOSIS — K219 Gastro-esophageal reflux disease without esophagitis: Secondary | ICD-10-CM | POA: Diagnosis not present

## 2018-03-23 DIAGNOSIS — Z008 Encounter for other general examination: Secondary | ICD-10-CM | POA: Diagnosis not present

## 2018-03-23 DIAGNOSIS — Z0000 Encounter for general adult medical examination without abnormal findings: Secondary | ICD-10-CM | POA: Diagnosis not present

## 2018-03-23 DIAGNOSIS — M546 Pain in thoracic spine: Secondary | ICD-10-CM | POA: Diagnosis not present

## 2018-03-23 DIAGNOSIS — M6281 Muscle weakness (generalized): Secondary | ICD-10-CM | POA: Diagnosis not present

## 2018-03-23 DIAGNOSIS — Z8719 Personal history of other diseases of the digestive system: Secondary | ICD-10-CM | POA: Diagnosis not present

## 2018-03-23 DIAGNOSIS — M5380 Other specified dorsopathies, site unspecified: Secondary | ICD-10-CM | POA: Diagnosis not present

## 2018-03-23 DIAGNOSIS — N390 Urinary tract infection, site not specified: Secondary | ICD-10-CM | POA: Diagnosis not present

## 2018-03-23 DIAGNOSIS — W19XXXA Unspecified fall, initial encounter: Secondary | ICD-10-CM | POA: Diagnosis not present

## 2018-03-23 DIAGNOSIS — Z23 Encounter for immunization: Secondary | ICD-10-CM | POA: Diagnosis not present

## 2018-03-23 DIAGNOSIS — Z9181 History of falling: Secondary | ICD-10-CM | POA: Diagnosis not present

## 2018-03-23 DIAGNOSIS — R0600 Dyspnea, unspecified: Secondary | ICD-10-CM | POA: Diagnosis not present

## 2018-03-23 DIAGNOSIS — R269 Unspecified abnormalities of gait and mobility: Secondary | ICD-10-CM | POA: Diagnosis not present

## 2018-03-23 DIAGNOSIS — M1990 Unspecified osteoarthritis, unspecified site: Secondary | ICD-10-CM | POA: Diagnosis not present

## 2018-03-23 DIAGNOSIS — R5381 Other malaise: Secondary | ICD-10-CM | POA: Diagnosis not present

## 2018-03-23 DIAGNOSIS — M8580 Other specified disorders of bone density and structure, unspecified site: Secondary | ICD-10-CM | POA: Diagnosis not present

## 2018-03-23 DIAGNOSIS — M810 Age-related osteoporosis without current pathological fracture: Secondary | ICD-10-CM | POA: Diagnosis not present

## 2018-03-24 ENCOUNTER — Other Ambulatory Visit: Payer: Self-pay

## 2018-03-24 ENCOUNTER — Ambulatory Visit (HOSPITAL_COMMUNITY): Payer: Medicare Other | Attending: Internal Medicine

## 2018-03-24 DIAGNOSIS — I5032 Chronic diastolic (congestive) heart failure: Secondary | ICD-10-CM

## 2018-03-24 DIAGNOSIS — I110 Hypertensive heart disease with heart failure: Secondary | ICD-10-CM | POA: Diagnosis not present

## 2018-03-24 DIAGNOSIS — E785 Hyperlipidemia, unspecified: Secondary | ICD-10-CM | POA: Insufficient documentation

## 2018-03-24 DIAGNOSIS — I348 Other nonrheumatic mitral valve disorders: Secondary | ICD-10-CM | POA: Insufficient documentation

## 2018-03-24 DIAGNOSIS — R0602 Shortness of breath: Secondary | ICD-10-CM | POA: Diagnosis not present

## 2018-03-24 DIAGNOSIS — E039 Hypothyroidism, unspecified: Secondary | ICD-10-CM | POA: Diagnosis not present

## 2018-03-28 ENCOUNTER — Other Ambulatory Visit: Payer: Self-pay | Admitting: *Deleted

## 2018-03-28 DIAGNOSIS — Z23 Encounter for immunization: Secondary | ICD-10-CM | POA: Diagnosis not present

## 2018-03-28 DIAGNOSIS — Z1389 Encounter for screening for other disorder: Secondary | ICD-10-CM | POA: Diagnosis not present

## 2018-03-28 DIAGNOSIS — H9190 Unspecified hearing loss, unspecified ear: Secondary | ICD-10-CM | POA: Diagnosis not present

## 2018-03-28 DIAGNOSIS — M810 Age-related osteoporosis without current pathological fracture: Secondary | ICD-10-CM | POA: Diagnosis not present

## 2018-03-28 DIAGNOSIS — M545 Low back pain: Secondary | ICD-10-CM | POA: Diagnosis not present

## 2018-03-28 DIAGNOSIS — I10 Essential (primary) hypertension: Secondary | ICD-10-CM | POA: Diagnosis not present

## 2018-03-28 DIAGNOSIS — Z008 Encounter for other general examination: Secondary | ICD-10-CM | POA: Diagnosis not present

## 2018-03-28 DIAGNOSIS — K219 Gastro-esophageal reflux disease without esophagitis: Secondary | ICD-10-CM | POA: Diagnosis not present

## 2018-03-28 DIAGNOSIS — Z8719 Personal history of other diseases of the digestive system: Secondary | ICD-10-CM | POA: Diagnosis not present

## 2018-03-28 DIAGNOSIS — R29898 Other symptoms and signs involving the musculoskeletal system: Secondary | ICD-10-CM | POA: Diagnosis not present

## 2018-03-28 DIAGNOSIS — R0781 Pleurodynia: Secondary | ICD-10-CM | POA: Diagnosis not present

## 2018-03-28 DIAGNOSIS — W19XXXA Unspecified fall, initial encounter: Secondary | ICD-10-CM | POA: Diagnosis not present

## 2018-03-28 DIAGNOSIS — M546 Pain in thoracic spine: Secondary | ICD-10-CM | POA: Diagnosis not present

## 2018-03-28 DIAGNOSIS — R0600 Dyspnea, unspecified: Secondary | ICD-10-CM | POA: Diagnosis not present

## 2018-03-28 DIAGNOSIS — M5380 Other specified dorsopathies, site unspecified: Secondary | ICD-10-CM | POA: Diagnosis not present

## 2018-03-28 DIAGNOSIS — M6281 Muscle weakness (generalized): Secondary | ICD-10-CM | POA: Diagnosis not present

## 2018-03-28 DIAGNOSIS — M1990 Unspecified osteoarthritis, unspecified site: Secondary | ICD-10-CM | POA: Diagnosis not present

## 2018-03-28 DIAGNOSIS — R269 Unspecified abnormalities of gait and mobility: Secondary | ICD-10-CM | POA: Diagnosis not present

## 2018-03-28 DIAGNOSIS — N390 Urinary tract infection, site not specified: Secondary | ICD-10-CM | POA: Diagnosis not present

## 2018-03-28 DIAGNOSIS — E039 Hypothyroidism, unspecified: Secondary | ICD-10-CM

## 2018-03-28 DIAGNOSIS — R2681 Unsteadiness on feet: Secondary | ICD-10-CM | POA: Diagnosis not present

## 2018-03-28 DIAGNOSIS — Z0000 Encounter for general adult medical examination without abnormal findings: Secondary | ICD-10-CM | POA: Diagnosis not present

## 2018-03-28 DIAGNOSIS — Z9181 History of falling: Secondary | ICD-10-CM | POA: Diagnosis not present

## 2018-03-28 DIAGNOSIS — M8580 Other specified disorders of bone density and structure, unspecified site: Secondary | ICD-10-CM | POA: Diagnosis not present

## 2018-03-28 DIAGNOSIS — R5381 Other malaise: Secondary | ICD-10-CM | POA: Diagnosis not present

## 2018-03-28 MED ORDER — LEVOTHYROXINE SODIUM 75 MCG PO TABS: 75.0000 ug | ORAL_TABLET | Freq: Every day | ORAL | 0 refills | Status: DC

## 2018-03-30 DIAGNOSIS — I10 Essential (primary) hypertension: Secondary | ICD-10-CM | POA: Diagnosis not present

## 2018-03-30 DIAGNOSIS — M546 Pain in thoracic spine: Secondary | ICD-10-CM | POA: Diagnosis not present

## 2018-03-30 DIAGNOSIS — R2681 Unsteadiness on feet: Secondary | ICD-10-CM | POA: Diagnosis not present

## 2018-03-30 DIAGNOSIS — K219 Gastro-esophageal reflux disease without esophagitis: Secondary | ICD-10-CM | POA: Diagnosis not present

## 2018-03-30 DIAGNOSIS — M810 Age-related osteoporosis without current pathological fracture: Secondary | ICD-10-CM | POA: Diagnosis not present

## 2018-03-30 DIAGNOSIS — R0600 Dyspnea, unspecified: Secondary | ICD-10-CM | POA: Diagnosis not present

## 2018-03-30 DIAGNOSIS — Z008 Encounter for other general examination: Secondary | ICD-10-CM | POA: Diagnosis not present

## 2018-03-30 DIAGNOSIS — H9190 Unspecified hearing loss, unspecified ear: Secondary | ICD-10-CM | POA: Diagnosis not present

## 2018-03-30 DIAGNOSIS — R269 Unspecified abnormalities of gait and mobility: Secondary | ICD-10-CM | POA: Diagnosis not present

## 2018-03-30 DIAGNOSIS — M6281 Muscle weakness (generalized): Secondary | ICD-10-CM | POA: Diagnosis not present

## 2018-03-30 DIAGNOSIS — Z1389 Encounter for screening for other disorder: Secondary | ICD-10-CM | POA: Diagnosis not present

## 2018-03-30 DIAGNOSIS — M8580 Other specified disorders of bone density and structure, unspecified site: Secondary | ICD-10-CM | POA: Diagnosis not present

## 2018-03-30 DIAGNOSIS — Z23 Encounter for immunization: Secondary | ICD-10-CM | POA: Diagnosis not present

## 2018-03-30 DIAGNOSIS — Z9181 History of falling: Secondary | ICD-10-CM | POA: Diagnosis not present

## 2018-03-30 DIAGNOSIS — N390 Urinary tract infection, site not specified: Secondary | ICD-10-CM | POA: Diagnosis not present

## 2018-03-30 DIAGNOSIS — Z0000 Encounter for general adult medical examination without abnormal findings: Secondary | ICD-10-CM | POA: Diagnosis not present

## 2018-03-30 DIAGNOSIS — W19XXXA Unspecified fall, initial encounter: Secondary | ICD-10-CM | POA: Diagnosis not present

## 2018-03-30 DIAGNOSIS — M5380 Other specified dorsopathies, site unspecified: Secondary | ICD-10-CM | POA: Diagnosis not present

## 2018-03-30 DIAGNOSIS — M545 Low back pain: Secondary | ICD-10-CM | POA: Diagnosis not present

## 2018-03-30 DIAGNOSIS — M1990 Unspecified osteoarthritis, unspecified site: Secondary | ICD-10-CM | POA: Diagnosis not present

## 2018-03-30 DIAGNOSIS — R0781 Pleurodynia: Secondary | ICD-10-CM | POA: Diagnosis not present

## 2018-03-30 DIAGNOSIS — Z8719 Personal history of other diseases of the digestive system: Secondary | ICD-10-CM | POA: Diagnosis not present

## 2018-03-30 DIAGNOSIS — R5381 Other malaise: Secondary | ICD-10-CM | POA: Diagnosis not present

## 2018-03-30 DIAGNOSIS — R29898 Other symptoms and signs involving the musculoskeletal system: Secondary | ICD-10-CM | POA: Diagnosis not present

## 2018-04-03 DIAGNOSIS — N289 Disorder of kidney and ureter, unspecified: Secondary | ICD-10-CM | POA: Diagnosis not present

## 2018-04-03 DIAGNOSIS — I5032 Chronic diastolic (congestive) heart failure: Secondary | ICD-10-CM | POA: Diagnosis not present

## 2018-04-03 DIAGNOSIS — I110 Hypertensive heart disease with heart failure: Secondary | ICD-10-CM | POA: Diagnosis not present

## 2018-04-03 LAB — COMPLETE METABOLIC PANEL WITH GFR
AG Ratio: 1.4 (calc) (ref 1.0–2.5)
ALT: 20 U/L (ref 6–29)
AST: 28 U/L (ref 10–35)
Albumin: 4.2 g/dL (ref 3.6–5.1)
Alkaline phosphatase (APISO): 86 U/L (ref 33–130)
BUN / CREAT RATIO: 23 (calc) — AB (ref 6–22)
BUN: 22 mg/dL (ref 7–25)
CALCIUM: 10.1 mg/dL (ref 8.6–10.4)
CO2: 27 mmol/L (ref 20–32)
Chloride: 96 mmol/L — ABNORMAL LOW (ref 98–110)
Creat: 0.97 mg/dL — ABNORMAL HIGH (ref 0.60–0.88)
GFR, EST AFRICAN AMERICAN: 62 mL/min/{1.73_m2} (ref >=60)
GFR, Est Non African American: 53 mL/min/{1.73_m2} — ABNORMAL LOW (ref >=60)
GLOBULIN: 3 g/dL (ref 1.9–3.7)
Glucose, Bld: 94 mg/dL (ref 65–99)
Potassium: 4.3 mmol/L (ref 3.5–5.3)
SODIUM: 133 mmol/L — AB (ref 135–146)
TOTAL PROTEIN: 7.2 g/dL (ref 6.1–8.1)
Total Bilirubin: 0.3 mg/dL (ref 0.2–1.2)

## 2018-04-04 DIAGNOSIS — M545 Low back pain: Secondary | ICD-10-CM | POA: Diagnosis not present

## 2018-04-04 DIAGNOSIS — K219 Gastro-esophageal reflux disease without esophagitis: Secondary | ICD-10-CM | POA: Diagnosis not present

## 2018-04-04 DIAGNOSIS — M1990 Unspecified osteoarthritis, unspecified site: Secondary | ICD-10-CM | POA: Diagnosis not present

## 2018-04-04 DIAGNOSIS — M8580 Other specified disorders of bone density and structure, unspecified site: Secondary | ICD-10-CM | POA: Diagnosis not present

## 2018-04-04 DIAGNOSIS — R0600 Dyspnea, unspecified: Secondary | ICD-10-CM | POA: Diagnosis not present

## 2018-04-04 DIAGNOSIS — M546 Pain in thoracic spine: Secondary | ICD-10-CM | POA: Diagnosis not present

## 2018-04-04 DIAGNOSIS — M6281 Muscle weakness (generalized): Secondary | ICD-10-CM | POA: Diagnosis not present

## 2018-04-04 DIAGNOSIS — Z1389 Encounter for screening for other disorder: Secondary | ICD-10-CM | POA: Diagnosis not present

## 2018-04-04 DIAGNOSIS — M5380 Other specified dorsopathies, site unspecified: Secondary | ICD-10-CM | POA: Diagnosis not present

## 2018-04-04 DIAGNOSIS — R0781 Pleurodynia: Secondary | ICD-10-CM | POA: Diagnosis not present

## 2018-04-04 DIAGNOSIS — Z8719 Personal history of other diseases of the digestive system: Secondary | ICD-10-CM | POA: Diagnosis not present

## 2018-04-04 DIAGNOSIS — M810 Age-related osteoporosis without current pathological fracture: Secondary | ICD-10-CM | POA: Diagnosis not present

## 2018-04-04 DIAGNOSIS — Z0000 Encounter for general adult medical examination without abnormal findings: Secondary | ICD-10-CM | POA: Diagnosis not present

## 2018-04-04 DIAGNOSIS — R29898 Other symptoms and signs involving the musculoskeletal system: Secondary | ICD-10-CM | POA: Diagnosis not present

## 2018-04-04 DIAGNOSIS — R269 Unspecified abnormalities of gait and mobility: Secondary | ICD-10-CM | POA: Diagnosis not present

## 2018-04-04 DIAGNOSIS — R5381 Other malaise: Secondary | ICD-10-CM | POA: Diagnosis not present

## 2018-04-04 DIAGNOSIS — I10 Essential (primary) hypertension: Secondary | ICD-10-CM | POA: Diagnosis not present

## 2018-04-04 DIAGNOSIS — R2681 Unsteadiness on feet: Secondary | ICD-10-CM | POA: Diagnosis not present

## 2018-04-04 DIAGNOSIS — H9190 Unspecified hearing loss, unspecified ear: Secondary | ICD-10-CM | POA: Diagnosis not present

## 2018-04-04 DIAGNOSIS — Z9181 History of falling: Secondary | ICD-10-CM | POA: Diagnosis not present

## 2018-04-04 DIAGNOSIS — Z008 Encounter for other general examination: Secondary | ICD-10-CM | POA: Diagnosis not present

## 2018-04-04 DIAGNOSIS — N390 Urinary tract infection, site not specified: Secondary | ICD-10-CM | POA: Diagnosis not present

## 2018-04-04 DIAGNOSIS — W19XXXA Unspecified fall, initial encounter: Secondary | ICD-10-CM | POA: Diagnosis not present

## 2018-04-04 DIAGNOSIS — Z23 Encounter for immunization: Secondary | ICD-10-CM | POA: Diagnosis not present

## 2018-04-06 ENCOUNTER — Other Ambulatory Visit: Payer: Self-pay | Admitting: Internal Medicine

## 2018-04-06 DIAGNOSIS — M8580 Other specified disorders of bone density and structure, unspecified site: Secondary | ICD-10-CM | POA: Diagnosis not present

## 2018-04-06 DIAGNOSIS — R2681 Unsteadiness on feet: Secondary | ICD-10-CM | POA: Diagnosis not present

## 2018-04-06 DIAGNOSIS — W19XXXA Unspecified fall, initial encounter: Secondary | ICD-10-CM | POA: Diagnosis not present

## 2018-04-06 DIAGNOSIS — Z008 Encounter for other general examination: Secondary | ICD-10-CM | POA: Diagnosis not present

## 2018-04-06 DIAGNOSIS — M810 Age-related osteoporosis without current pathological fracture: Secondary | ICD-10-CM | POA: Diagnosis not present

## 2018-04-06 DIAGNOSIS — R0600 Dyspnea, unspecified: Secondary | ICD-10-CM | POA: Diagnosis not present

## 2018-04-06 DIAGNOSIS — H9190 Unspecified hearing loss, unspecified ear: Secondary | ICD-10-CM | POA: Diagnosis not present

## 2018-04-06 DIAGNOSIS — M5380 Other specified dorsopathies, site unspecified: Secondary | ICD-10-CM | POA: Diagnosis not present

## 2018-04-06 DIAGNOSIS — M6281 Muscle weakness (generalized): Secondary | ICD-10-CM | POA: Diagnosis not present

## 2018-04-06 DIAGNOSIS — R269 Unspecified abnormalities of gait and mobility: Secondary | ICD-10-CM | POA: Diagnosis not present

## 2018-04-06 DIAGNOSIS — Z0000 Encounter for general adult medical examination without abnormal findings: Secondary | ICD-10-CM | POA: Diagnosis not present

## 2018-04-06 DIAGNOSIS — R5381 Other malaise: Secondary | ICD-10-CM | POA: Diagnosis not present

## 2018-04-06 DIAGNOSIS — I10 Essential (primary) hypertension: Secondary | ICD-10-CM | POA: Diagnosis not present

## 2018-04-06 DIAGNOSIS — R0781 Pleurodynia: Secondary | ICD-10-CM | POA: Diagnosis not present

## 2018-04-06 DIAGNOSIS — M546 Pain in thoracic spine: Secondary | ICD-10-CM | POA: Diagnosis not present

## 2018-04-06 DIAGNOSIS — Z1389 Encounter for screening for other disorder: Secondary | ICD-10-CM | POA: Diagnosis not present

## 2018-04-06 DIAGNOSIS — Z8719 Personal history of other diseases of the digestive system: Secondary | ICD-10-CM | POA: Diagnosis not present

## 2018-04-06 DIAGNOSIS — Z9181 History of falling: Secondary | ICD-10-CM | POA: Diagnosis not present

## 2018-04-06 DIAGNOSIS — M545 Low back pain: Secondary | ICD-10-CM | POA: Diagnosis not present

## 2018-04-06 DIAGNOSIS — F321 Major depressive disorder, single episode, moderate: Secondary | ICD-10-CM

## 2018-04-06 DIAGNOSIS — M1990 Unspecified osteoarthritis, unspecified site: Secondary | ICD-10-CM | POA: Diagnosis not present

## 2018-04-06 DIAGNOSIS — Z23 Encounter for immunization: Secondary | ICD-10-CM | POA: Diagnosis not present

## 2018-04-06 DIAGNOSIS — R29898 Other symptoms and signs involving the musculoskeletal system: Secondary | ICD-10-CM | POA: Diagnosis not present

## 2018-04-06 DIAGNOSIS — N390 Urinary tract infection, site not specified: Secondary | ICD-10-CM | POA: Diagnosis not present

## 2018-04-06 DIAGNOSIS — K219 Gastro-esophageal reflux disease without esophagitis: Secondary | ICD-10-CM | POA: Diagnosis not present

## 2018-04-07 DIAGNOSIS — Z23 Encounter for immunization: Secondary | ICD-10-CM | POA: Diagnosis not present

## 2018-04-07 DIAGNOSIS — H9190 Unspecified hearing loss, unspecified ear: Secondary | ICD-10-CM | POA: Diagnosis not present

## 2018-04-07 DIAGNOSIS — M1990 Unspecified osteoarthritis, unspecified site: Secondary | ICD-10-CM | POA: Diagnosis not present

## 2018-04-07 DIAGNOSIS — N390 Urinary tract infection, site not specified: Secondary | ICD-10-CM | POA: Diagnosis not present

## 2018-04-07 DIAGNOSIS — R5381 Other malaise: Secondary | ICD-10-CM | POA: Diagnosis not present

## 2018-04-07 DIAGNOSIS — I10 Essential (primary) hypertension: Secondary | ICD-10-CM | POA: Diagnosis not present

## 2018-04-07 DIAGNOSIS — R2681 Unsteadiness on feet: Secondary | ICD-10-CM | POA: Diagnosis not present

## 2018-04-07 DIAGNOSIS — Z9181 History of falling: Secondary | ICD-10-CM | POA: Diagnosis not present

## 2018-04-07 DIAGNOSIS — M6281 Muscle weakness (generalized): Secondary | ICD-10-CM | POA: Diagnosis not present

## 2018-04-07 DIAGNOSIS — W19XXXA Unspecified fall, initial encounter: Secondary | ICD-10-CM | POA: Diagnosis not present

## 2018-04-07 DIAGNOSIS — M5380 Other specified dorsopathies, site unspecified: Secondary | ICD-10-CM | POA: Diagnosis not present

## 2018-04-07 DIAGNOSIS — R0600 Dyspnea, unspecified: Secondary | ICD-10-CM | POA: Diagnosis not present

## 2018-04-07 DIAGNOSIS — M546 Pain in thoracic spine: Secondary | ICD-10-CM | POA: Diagnosis not present

## 2018-04-07 DIAGNOSIS — R29898 Other symptoms and signs involving the musculoskeletal system: Secondary | ICD-10-CM | POA: Diagnosis not present

## 2018-04-07 DIAGNOSIS — R269 Unspecified abnormalities of gait and mobility: Secondary | ICD-10-CM | POA: Diagnosis not present

## 2018-04-07 DIAGNOSIS — Z008 Encounter for other general examination: Secondary | ICD-10-CM | POA: Diagnosis not present

## 2018-04-07 DIAGNOSIS — K219 Gastro-esophageal reflux disease without esophagitis: Secondary | ICD-10-CM | POA: Diagnosis not present

## 2018-04-07 DIAGNOSIS — Z0000 Encounter for general adult medical examination without abnormal findings: Secondary | ICD-10-CM | POA: Diagnosis not present

## 2018-04-07 DIAGNOSIS — M810 Age-related osteoporosis without current pathological fracture: Secondary | ICD-10-CM | POA: Diagnosis not present

## 2018-04-07 DIAGNOSIS — Z8719 Personal history of other diseases of the digestive system: Secondary | ICD-10-CM | POA: Diagnosis not present

## 2018-04-07 DIAGNOSIS — M545 Low back pain: Secondary | ICD-10-CM | POA: Diagnosis not present

## 2018-04-07 DIAGNOSIS — R0781 Pleurodynia: Secondary | ICD-10-CM | POA: Diagnosis not present

## 2018-04-07 DIAGNOSIS — Z1389 Encounter for screening for other disorder: Secondary | ICD-10-CM | POA: Diagnosis not present

## 2018-04-07 DIAGNOSIS — M8580 Other specified disorders of bone density and structure, unspecified site: Secondary | ICD-10-CM | POA: Diagnosis not present

## 2018-04-08 ENCOUNTER — Other Ambulatory Visit: Payer: Self-pay | Admitting: Internal Medicine

## 2018-04-08 DIAGNOSIS — F321 Major depressive disorder, single episode, moderate: Secondary | ICD-10-CM

## 2018-04-10 ENCOUNTER — Non-Acute Institutional Stay: Payer: Medicare Other | Admitting: Internal Medicine

## 2018-04-10 ENCOUNTER — Other Ambulatory Visit: Payer: Self-pay | Admitting: Internal Medicine

## 2018-04-10 ENCOUNTER — Encounter: Payer: Self-pay | Admitting: Internal Medicine

## 2018-04-10 VITALS — BP 138/74 | HR 74 | Temp 98.7°F | Ht 64.0 in | Wt 178.4 lb

## 2018-04-10 DIAGNOSIS — M545 Low back pain, unspecified: Secondary | ICD-10-CM

## 2018-04-10 DIAGNOSIS — N183 Chronic kidney disease, stage 3 unspecified: Secondary | ICD-10-CM

## 2018-04-10 DIAGNOSIS — F320 Major depressive disorder, single episode, mild: Secondary | ICD-10-CM | POA: Diagnosis not present

## 2018-04-10 DIAGNOSIS — I110 Hypertensive heart disease with heart failure: Secondary | ICD-10-CM | POA: Diagnosis not present

## 2018-04-10 DIAGNOSIS — F321 Major depressive disorder, single episode, moderate: Secondary | ICD-10-CM

## 2018-04-10 DIAGNOSIS — G8929 Other chronic pain: Secondary | ICD-10-CM

## 2018-04-10 DIAGNOSIS — I739 Peripheral vascular disease, unspecified: Secondary | ICD-10-CM | POA: Diagnosis not present

## 2018-04-10 DIAGNOSIS — I5032 Chronic diastolic (congestive) heart failure: Secondary | ICD-10-CM

## 2018-04-10 MED ORDER — TORSEMIDE 20 MG PO TABS: 20.0000 mg | ORAL_TABLET | Freq: Every day | ORAL | 3 refills | Status: DC

## 2018-04-10 MED ORDER — POTASSIUM CHLORIDE CRYS ER 20 MEQ PO TBCR: 20.0000 meq | EXTENDED_RELEASE_TABLET | Freq: Two times a day (BID) | ORAL | 2 refills | Status: DC

## 2018-04-10 MED ORDER — TORSEMIDE 20 MG PO TABS: 20.0000 mg | ORAL_TABLET | Freq: Two times a day (BID) | ORAL | 2 refills | Status: DC

## 2018-04-10 MED ORDER — BUPROPION HCL ER (SR) 150 MG PO TB12: 150.0000 mg | ORAL_TABLET | Freq: Two times a day (BID) | ORAL | 3 refills | Status: DC

## 2018-04-10 MED ORDER — POTASSIUM CHLORIDE CRYS ER 20 MEQ PO TBCR: 20.0000 meq | EXTENDED_RELEASE_TABLET | Freq: Every day | ORAL | 3 refills | Status: DC

## 2018-04-10 NOTE — Progress Notes (Signed)
Location:  [place] of Service:  Clinic (12) Provider:  Blanchie Serve, MD  Blanchie Serve, MD  Patient Care Team: Blanchie Serve, MD as PCP - General (Internal Medicine)  Extended Emergency Contact Information Primary Emergency Contact: Dressel,[name] Address: [place],  [address] [address] [place] Phone: [phone] Mobile Phone: [phone] Relation: Son Secondary Emergency Contact: Althoff,[name]  [place] Phone: [phone] Relation: None  Code Status:  DNR  Goals of care: Advanced Directive information Advanced Directives 04/10/2018  Does Patient Have a Medical Advance Directive? Yes  Type of Advance Directive Out of facility DNR (pink MOST or yellow form)  Does patient want to make changes to medical advance directive? -  Copy of [place] in Chart? -  Pre-existing out of facility DNR order (yellow form or pink MOST form) Yellow form placed in chart (order not valid for inpatient use)     Chief Complaint  Patient presents with  . Acute Visit    Pt is being seen to discuss lab results.     HPI:  Pt is a 84 y.o. female seen today for an acute visit for follow up on her kidney function, BP and leg edema. She is currently on losartan 100 mg daily, amlodipine 10 mg daily and hydralazine 25 mg bid for BP. Taking furosemide 40 mg am and 20 mg pm with kcl 20 meq am and 10 meq pm. Her edema has increased and she feels her clothes to be tighter. Daughter in law present with her this visit. Would like wellbutrin refill sent to [facility]. Pain is ongoing and pt taking tylenol 1000 mg tid and tramadol 25 at bedtime without relief. Her neurosurgeon office was contacted and they recommend pain management.    Past Medical History:  Diagnosis Date  . Anemia   . Anxiety   . Chronotropic incompetence 12/2012    potentially medication related; noted on CPET   . DDD (degenerative disc disease)   .  Eczema   . GERD (gastroesophageal reflux disease)   . H/O hiatal hernia   . Hx: UTI (urinary tract infection)   . Hyperlipidemia   . Hypertension   . Hypothyroidism   . Osteopenia   . Septicemia ([place]) 2002   following UTI  . Vertigo, benign positional    Past Surgical History:  Procedure Laterality Date  . BREAST SURGERY     left biopsy  . CATARACT EXTRACTION Right    2 weeks ago  . CPET / MET - PFTS     Consistent with chronotropic incompetence; See attached report in the results section  . DILATION AND CURETTAGE OF UTERUS     x 2  . DOPPLER ECHOCARDIOGRAPHY  01/10/2013   Normal LV size and function. Normal EF. Air sclerosis but no stenosis  . ESOPHAGOGASTRODUODENOSCOPY  05/04/2012   Procedure: ESOPHAGOGASTRODUODENOSCOPY (EGD);  Surgeon: Inda Castle, MD;  Location: [facility];  Service: Endoscopy;  Laterality: N/A;  . EYE SURGERY     cataract extraction with ILO  ? eye  . IR KYPHO EA ADDL LEVEL THORACIC OR LUMBAR  10/28/2016  . IR KYPHO LUMBAR INC FX REDUCE BONE BX UNI/BIL CANNULATION INC/IMAGING  10/28/2016  . IR KYPHO THORACIC WITH BONE BIOPSY  12/24/2016  . IR RADIOLOGIST EVAL & MGMT  11/11/2016  . KNEE ARTHROSCOPY  2011   right  . TONSILLECTOMY    . TOTAL  KNEE ARTHROPLASTY  04/24/2012   Procedure: TOTAL KNEE ARTHROPLASTY;  Surgeon: Gearlean Alf, MD;  Location: [facility];  Service: Orthopedics;  Laterality: Right;    No Known Allergies  Outpatient Encounter Medications as of 04/10/2018  Medication Sig  . acetaminophen (TYLENOL) 500 MG tablet Take 2 tablets (1,000 mg total) by mouth 3 (three) times daily.  Marland Kitchen ALPRAZolam (XANAX) 0.25 MG tablet Take 0.5 mg by mouth daily as needed for anxiety. Take half a tablet  . amLODipine (NORVASC) 10 MG tablet Take 1 tablet (10 mg total) by mouth daily.  Marland Kitchen buPROPion (WELLBUTRIN SR) 150 MG 12 hr tablet Take 1 tablet (150 mg total) by mouth 2 (two) times daily after a meal.  . calcium carbonate (OS-CAL) 600 MG TABS tablet Take 1 tablet  (600 mg total) by mouth 2 (two) times daily with a meal.  . cholecalciferol (VITAMIN D) 1000 UNITS tablet Take 1,000 Units by mouth daily.  Marland Kitchen docusate sodium (COLACE) 100 MG capsule Take 100 mg by mouth 2 (two) times daily.  . fish oil-omega-3 fatty acids 1000 MG capsule Take 1 g by mouth daily.  . hydrALAZINE (APRESOLINE) 25 MG tablet Take 1 tablet (25 mg total) by mouth 2 (two) times daily.  Marland Kitchen levothyroxine (SYNTHROID, LEVOTHROID) 75 MCG tablet Take 1 tablet (75 mcg total) by mouth daily before breakfast.  . loperamide (IMODIUM A-D) 2 MG tablet Take 2 mg by mouth as needed for diarrhea or loose stools.  Marland Kitchen losartan (COZAAR) 100 MG tablet TAKE 1 TABLET BY MOUTH  DAILY  . Multiple Vitamin (MULTIVITAMIN WITH MINERALS) TABS tablet Take 1 tablet by mouth daily.  . nitrofurantoin (MACRODANTIN) 50 MG capsule Take 1 capsule (50 mg total) by mouth daily.  . NON FORMULARY Take 12,600 mg by mouth daily as needed (Take 1 capsule). Triple Strength Cranberry  . omeprazole (PRILOSEC) 40 MG capsule Take 40 mg by mouth daily.  . potassium chloride SA (K-DUR,KLOR-CON) 20 MEQ tablet Take 20 mEq by mouth 2 (two) times daily.  . sennosides-docusate sodium (SENOKOT-S) 8.6-50 MG tablet Take 2 tablets by mouth at bedtime.  . torsemide (DEMADEX) 20 MG tablet Take 20 mg by mouth 2 (two) times daily.  . traMADol (ULTRAM) 50 MG tablet Take 0.5 tablets (25 mg total) by mouth at bedtime.  . TYMLOS 3120 MCG/1.56ML SOPN INJECT 80MCG SUBCUTANEOUSLY ONCE DAILY  . zinc oxide 20 % ointment Apply 1 application topically as needed for irritation.  . [DISCONTINUED] buPROPion (WELLBUTRIN SR) 150 MG 12 hr tablet Take 1 tablet (150 mg total) by mouth 2 (two) times daily after a meal.  . [DISCONTINUED] furosemide (LASIX) 40 MG tablet Take 1 pill in morning at 7 am and half a pill at 3 pm  . [DISCONTINUED] potassium chloride (K-DUR) 10 MEQ tablet Take 2 tablet at 7 am and 1 tablet at 3 pm  . [DISCONTINUED] potassium chloride  (K-DUR,KLOR-CON) 10 MEQ tablet Take 2 tablets at 7 am and 1 tablet at 3 pm.  . [DISCONTINUED] potassium chloride SA (K-DUR,KLOR-CON) 20 MEQ tablet Take 1 tablet (20 mEq total) by mouth daily. (Patient taking differently: Take 20 mEq by mouth 2 (two) times daily. )  . [DISCONTINUED] torsemide (DEMADEX) 20 MG tablet Take 1 tablet (20 mg total) by mouth daily. (Patient taking differently: Take 20 mg by mouth 2 (two) times daily. )  . [DISCONTINUED] omeprazole (PRILOSEC) 40 MG capsule Take 1 capsule (40 mg total) by mouth daily.   No facility-administered encounter medications on file as  of 04/10/2018.     Review of Systems  Constitutional: Positive for fatigue. Negative for chills and fever.  Respiratory: Positive for shortness of breath. Negative for cough and wheezing.        Dyspnea on exertion  Cardiovascular: Positive for leg swelling. Negative for chest pain and palpitations.  Gastrointestinal: Negative for abdominal pain, constipation, diarrhea, nausea and vomiting.  Genitourinary: Negative for dysuria.  Musculoskeletal: Positive for arthralgias and back pain.  Neurological: Negative for dizziness and headaches.  Psychiatric/Behavioral: Positive for decreased concentration and dysphoric mood. The patient is nervous/anxious.     Immunization History  Administered Date(s) Administered  . Influenza, High Dose Seasonal PF 04/26/2016, 03/22/2017  . Influenza-Unspecified 04/22/2011, 04/04/2012, 05/03/2013, 05/04/2013, 04/17/2014, 04/19/2015, 04/26/2016, 04/22/2017  . Pneumococcal Polysaccharide-23 08/26/2003, 09/06/2011  . Pneumococcal-Unspecified 12/20/2013  . Td 09/26/2006  . Zoster 10/25/2007  . Zoster Recombinat (Shingrix) 11/21/2017, 04/08/2018   Pertinent  Health Maintenance Due  Topic Date Due  . DEXA SCAN  07/30/1997  . PNA vac Low Risk Adult (2 of 2 - PCV13) 12/21/2014  . INFLUENZA VACCINE  02/23/2018   Fall Risk  04/10/2018 10/28/2017  Falls in the past year? No No    Functional Status Survey:    Vitals:   04/10/18 1457  BP: 138/74  Pulse: 74  Temp: 98.7 F (37.1 C)  TempSrc: Oral  SpO2: 96%  Weight: 178 lb 6.4 oz (80.9 kg)  Height: '5\' 4"'  (1.626 m)   Body mass index is 30.62 kg/m.   Wt Readings from Last 3 Encounters:  04/10/18 178 lb 6.4 oz (80.9 kg)  03/06/18 167 lb (75.8 kg)  02/08/18 168 lb 6.4 oz (76.4 kg)   Physical Exam  Constitutional: She is oriented to person, place, and time. No distress.  Obese elderly female  HENT:  Head: Normocephalic and atraumatic.  Mouth/Throat: Oropharynx is clear and moist.  Eyes: Conjunctivae are normal. Right eye exhibits no discharge. Left eye exhibits no discharge.  Neck: Normal range of motion. Neck supple.  Cardiovascular: Normal rate and regular rhythm.  Pulmonary/Chest: Effort normal. No respiratory distress. She has no wheezes. She has rales.  Abdominal: Soft. Bowel sounds are normal. She exhibits distension.  Musculoskeletal: She exhibits edema.  Slow with transfer, guarded gait, uses walker and motorized wheelchair  Neurological: She is alert and oriented to person, place, and time.  Skin: Skin is warm and dry. She is not diaphoretic.  Psychiatric:  anxious    Labs reviewed: Recent Labs    02/06/18 0745 02/27/18 0000 04/03/18 0810  NA 134* 136 133*  K 3.8 4.4 4.3  CL 95* 98 96*  CO2 '28 27 27  ' GLUCOSE 98 104* 94  BUN 22 33* 22  CREATININE 0.90* 1.07* 0.97*  CALCIUM 10.1 10.3 10.1   Recent Labs    10/25/17 1446  01/14/18 0928 01/23/18 02/06/18 0745 02/27/18 0000 04/03/18 0810  AST 30   < > 60* '24 21 25 28  ' ALT 22   < > '26 21 14 17 20  ' ALKPHOS 99  --  85 84  --   --   --   BILITOT 0.3   < > 0.8  --  0.4 0.4 0.3  PROT 7.4   < > 7.9 6.6 7.4 8.2* 7.2  ALBUMIN 3.9  --  4.3 3.9  --   --   --    < > = values in this interval not displayed.   Recent Labs    08/25/17 0845 10/25/17 1446  01/14/18 0928  01/16/18 0356 01/23/18 02/06/18 0745 02/27/18 0000  WBC 3.4*  5.2   < > 10.2   < > 5.1 4.3 4.5 4.7  NEUTROABS 1,992 3.1  --  7.9*  --   --   --   --   --   HGB 10.4* 10.7*   < > 12.3   < > 9.7* 10.1* 10.9* 11.9  HCT 31.6* 32.8*   < > 37.3   < > 29.8* 30* 32.0* 36.3  MCV 82.9 87.9   < > 88.0   < > 89.5  --  85.8 87.1  PLT 97* 84*   < > 98*   < > 75* 106* 77* 79*   < > = values in this interval not displayed.   Lab Results  Component Value Date   TSH 2.63 12/09/2017   No results found for: HGBA1C Lab Results  Component Value Date   CHOL 212 (H) 12/09/2017   HDL 72 12/09/2017   LDLCALC 121 (H) 12/09/2017   TRIG 88 12/09/2017   CHOLHDL 2.9 12/09/2017    Significant Diagnostic Results in last 30 days:  No results found.  Assessment/Plan  1. Current mild episode of major depressive disorder, unspecified whether recurrent ([place]) Refill provided - buPROPion (WELLBUTRIN SR) 150 MG 12 hr tablet; Take 1 tablet (150 mg total) by mouth 2 (two) times daily after a meal.  Dispense: 180 tablet; Refill: 3  2. Chronic bilateral low back pain without sciatica Continue pain regimen, no changes made - Ambulatory referral to Pain Clinic  3. Hypertensive heart disease with chronic diastolic congestive heart failure (HCC) With weight gain, worsening dyspnea, and impaired kidney function, d/c furosemide, change kcl to 20 meq twice daily and start torsemide 20 mg twice daily. Bmp check at cardiology office. Continue losartan and amlodipine.  - torsemide (DEMADEX) 20 MG tablet; Take 1 tablet (20 mg total) by mouth twice daily.  Dispense: 60tablet; Refill: 3 - potassium chloride SA (K-DUR,KLOR-CON) 20 MEQ tablet; Take 1 tablet (20 mEq total) by mouth twice daily.  Dispense: 60 tablet; Refill: 3  4. PVD (peripheral vascular disease) ([place]) See above - torsemide (DEMADEX) 20 MG tablet; Take 1 tablet (20 mg total) by mouth twice daily.  Dispense: 60 tablet; Refill: 3 - potassium chloride SA (K-DUR,KLOR-CON) 20 MEQ tablet; Take 1 tablet (20 mEq total) by mouth twice  daily.  Dispense: 60 tablet; Refill: 3  5. CKD (chronic kidney disease) stage 3, GFR 30-59 ml/min (HCC) Reviewed renal function, improved lab from before but change to diuretic made this visit with increased weight and to prevent worsening of kidney function.  - torsemide (DEMADEX) 20 MG tablet; Take 1 tablet (20 mg total) by mouth twice daily.  Dispense: 60 tablet; Refill: 3 - potassium chloride SA (K-DUR,KLOR-CON) 20 MEQ tablet; Take 1 tablet (20 mEq total) by mouth twice daily.  Dispense: 60 tablet; Refill: 3    Family/ staff Communication: reviewed care plan with patient and charge nurse.   Labs/tests ordered:  BMP  Blanchie Serve, MD Internal Medicine [facility] [address] [place], [place] [address] Cell Phone (Monday-Friday 8 am - 5 pm): [phone] On Call: [phone] and follow prompts after 5 pm and on weekends Office Phone: [phone] Office Fax: [phone]

## 2018-04-10 NOTE — Patient Instructions (Addendum)
Stop taking furosemide and potassium pill current regimen.   Start taking torsemide 20 mg twice a day and potassium chloride 20 meq twice daily for now.  Have lab drawn at your heart doctor clinic during follow up.   I am providing referral for pain clinic to see you. You should hear from their office.   A copy of this AVS and lab with echocardiogram will be mailed to you.

## 2018-04-11 DIAGNOSIS — R0781 Pleurodynia: Secondary | ICD-10-CM | POA: Diagnosis not present

## 2018-04-11 DIAGNOSIS — M1990 Unspecified osteoarthritis, unspecified site: Secondary | ICD-10-CM | POA: Diagnosis not present

## 2018-04-11 DIAGNOSIS — M810 Age-related osteoporosis without current pathological fracture: Secondary | ICD-10-CM | POA: Diagnosis not present

## 2018-04-11 DIAGNOSIS — R29898 Other symptoms and signs involving the musculoskeletal system: Secondary | ICD-10-CM | POA: Diagnosis not present

## 2018-04-11 DIAGNOSIS — Z008 Encounter for other general examination: Secondary | ICD-10-CM | POA: Diagnosis not present

## 2018-04-11 DIAGNOSIS — R2681 Unsteadiness on feet: Secondary | ICD-10-CM | POA: Diagnosis not present

## 2018-04-11 DIAGNOSIS — Z9181 History of falling: Secondary | ICD-10-CM | POA: Diagnosis not present

## 2018-04-11 DIAGNOSIS — Z8719 Personal history of other diseases of the digestive system: Secondary | ICD-10-CM | POA: Diagnosis not present

## 2018-04-11 DIAGNOSIS — R269 Unspecified abnormalities of gait and mobility: Secondary | ICD-10-CM | POA: Diagnosis not present

## 2018-04-11 DIAGNOSIS — W19XXXA Unspecified fall, initial encounter: Secondary | ICD-10-CM | POA: Diagnosis not present

## 2018-04-11 DIAGNOSIS — R0600 Dyspnea, unspecified: Secondary | ICD-10-CM | POA: Diagnosis not present

## 2018-04-11 DIAGNOSIS — H9190 Unspecified hearing loss, unspecified ear: Secondary | ICD-10-CM | POA: Diagnosis not present

## 2018-04-11 DIAGNOSIS — R5381 Other malaise: Secondary | ICD-10-CM | POA: Diagnosis not present

## 2018-04-11 DIAGNOSIS — N390 Urinary tract infection, site not specified: Secondary | ICD-10-CM | POA: Diagnosis not present

## 2018-04-11 DIAGNOSIS — K219 Gastro-esophageal reflux disease without esophagitis: Secondary | ICD-10-CM | POA: Diagnosis not present

## 2018-04-11 DIAGNOSIS — M8580 Other specified disorders of bone density and structure, unspecified site: Secondary | ICD-10-CM | POA: Diagnosis not present

## 2018-04-11 DIAGNOSIS — Z0000 Encounter for general adult medical examination without abnormal findings: Secondary | ICD-10-CM | POA: Diagnosis not present

## 2018-04-11 DIAGNOSIS — M6281 Muscle weakness (generalized): Secondary | ICD-10-CM | POA: Diagnosis not present

## 2018-04-11 DIAGNOSIS — M545 Low back pain: Secondary | ICD-10-CM | POA: Diagnosis not present

## 2018-04-11 DIAGNOSIS — I10 Essential (primary) hypertension: Secondary | ICD-10-CM | POA: Diagnosis not present

## 2018-04-11 DIAGNOSIS — Z1389 Encounter for screening for other disorder: Secondary | ICD-10-CM | POA: Diagnosis not present

## 2018-04-11 DIAGNOSIS — Z23 Encounter for immunization: Secondary | ICD-10-CM | POA: Diagnosis not present

## 2018-04-11 DIAGNOSIS — M5380 Other specified dorsopathies, site unspecified: Secondary | ICD-10-CM | POA: Diagnosis not present

## 2018-04-11 DIAGNOSIS — M546 Pain in thoracic spine: Secondary | ICD-10-CM | POA: Diagnosis not present

## 2018-04-13 DIAGNOSIS — M5380 Other specified dorsopathies, site unspecified: Secondary | ICD-10-CM | POA: Diagnosis not present

## 2018-04-13 DIAGNOSIS — M545 Low back pain: Secondary | ICD-10-CM | POA: Diagnosis not present

## 2018-04-13 DIAGNOSIS — R5381 Other malaise: Secondary | ICD-10-CM | POA: Diagnosis not present

## 2018-04-13 DIAGNOSIS — Z008 Encounter for other general examination: Secondary | ICD-10-CM | POA: Diagnosis not present

## 2018-04-13 DIAGNOSIS — I10 Essential (primary) hypertension: Secondary | ICD-10-CM | POA: Diagnosis not present

## 2018-04-13 DIAGNOSIS — M6281 Muscle weakness (generalized): Secondary | ICD-10-CM | POA: Diagnosis not present

## 2018-04-13 DIAGNOSIS — M810 Age-related osteoporosis without current pathological fracture: Secondary | ICD-10-CM | POA: Diagnosis not present

## 2018-04-13 DIAGNOSIS — K219 Gastro-esophageal reflux disease without esophagitis: Secondary | ICD-10-CM | POA: Diagnosis not present

## 2018-04-13 DIAGNOSIS — Z0000 Encounter for general adult medical examination without abnormal findings: Secondary | ICD-10-CM | POA: Diagnosis not present

## 2018-04-13 DIAGNOSIS — M1990 Unspecified osteoarthritis, unspecified site: Secondary | ICD-10-CM | POA: Diagnosis not present

## 2018-04-13 DIAGNOSIS — Z8719 Personal history of other diseases of the digestive system: Secondary | ICD-10-CM | POA: Diagnosis not present

## 2018-04-13 DIAGNOSIS — M546 Pain in thoracic spine: Secondary | ICD-10-CM | POA: Diagnosis not present

## 2018-04-13 DIAGNOSIS — Z9181 History of falling: Secondary | ICD-10-CM | POA: Diagnosis not present

## 2018-04-13 DIAGNOSIS — R0600 Dyspnea, unspecified: Secondary | ICD-10-CM | POA: Diagnosis not present

## 2018-04-13 DIAGNOSIS — R29898 Other symptoms and signs involving the musculoskeletal system: Secondary | ICD-10-CM | POA: Diagnosis not present

## 2018-04-13 DIAGNOSIS — R269 Unspecified abnormalities of gait and mobility: Secondary | ICD-10-CM | POA: Diagnosis not present

## 2018-04-13 DIAGNOSIS — Z23 Encounter for immunization: Secondary | ICD-10-CM | POA: Diagnosis not present

## 2018-04-13 DIAGNOSIS — W19XXXA Unspecified fall, initial encounter: Secondary | ICD-10-CM | POA: Diagnosis not present

## 2018-04-13 DIAGNOSIS — H9190 Unspecified hearing loss, unspecified ear: Secondary | ICD-10-CM | POA: Diagnosis not present

## 2018-04-13 DIAGNOSIS — N390 Urinary tract infection, site not specified: Secondary | ICD-10-CM | POA: Diagnosis not present

## 2018-04-13 DIAGNOSIS — R0781 Pleurodynia: Secondary | ICD-10-CM | POA: Diagnosis not present

## 2018-04-13 DIAGNOSIS — R2681 Unsteadiness on feet: Secondary | ICD-10-CM | POA: Diagnosis not present

## 2018-04-13 DIAGNOSIS — M8580 Other specified disorders of bone density and structure, unspecified site: Secondary | ICD-10-CM | POA: Diagnosis not present

## 2018-04-13 DIAGNOSIS — Z1389 Encounter for screening for other disorder: Secondary | ICD-10-CM | POA: Diagnosis not present

## 2018-04-14 DIAGNOSIS — M79672 Pain in left foot: Secondary | ICD-10-CM | POA: Diagnosis not present

## 2018-04-14 DIAGNOSIS — B351 Tinea unguium: Secondary | ICD-10-CM | POA: Diagnosis not present

## 2018-04-14 DIAGNOSIS — L84 Corns and callosities: Secondary | ICD-10-CM | POA: Diagnosis not present

## 2018-04-14 DIAGNOSIS — M79671 Pain in right foot: Secondary | ICD-10-CM | POA: Diagnosis not present

## 2018-04-15 ENCOUNTER — Other Ambulatory Visit: Payer: Self-pay | Admitting: Internal Medicine

## 2018-04-15 DIAGNOSIS — E039 Hypothyroidism, unspecified: Secondary | ICD-10-CM

## 2018-04-17 ENCOUNTER — Other Ambulatory Visit: Payer: Self-pay | Admitting: Internal Medicine

## 2018-04-17 DIAGNOSIS — M545 Low back pain, unspecified: Secondary | ICD-10-CM

## 2018-04-17 DIAGNOSIS — G8929 Other chronic pain: Secondary | ICD-10-CM

## 2018-04-18 ENCOUNTER — Other Ambulatory Visit: Payer: Self-pay | Admitting: *Deleted

## 2018-04-18 NOTE — Telephone Encounter (Signed)
Dorene GrebeNatalie, Caregiver called requesting refill on Tramadol. Patient is out. Needs refill.   Resent to Dr. Glade LloydPandey for approval.

## 2018-04-20 ENCOUNTER — Encounter: Payer: Self-pay | Admitting: Physical Medicine & Rehabilitation

## 2018-04-25 ENCOUNTER — Ambulatory Visit: Payer: Medicare Other

## 2018-04-25 ENCOUNTER — Ambulatory Visit: Payer: Medicare Other | Admitting: Oncology

## 2018-05-01 ENCOUNTER — Other Ambulatory Visit: Payer: Self-pay

## 2018-05-01 ENCOUNTER — Ambulatory Visit: Payer: Medicare Other | Admitting: Physical Medicine & Rehabilitation

## 2018-05-01 ENCOUNTER — Encounter: Payer: Medicare Other | Attending: Physical Medicine & Rehabilitation

## 2018-05-01 ENCOUNTER — Encounter: Payer: Self-pay | Admitting: Physical Medicine & Rehabilitation

## 2018-05-01 VITALS — BP 150/73 | HR 74 | Ht 61.0 in | Wt 176.0 lb

## 2018-05-01 DIAGNOSIS — K219 Gastro-esophageal reflux disease without esophagitis: Secondary | ICD-10-CM | POA: Insufficient documentation

## 2018-05-01 DIAGNOSIS — Z833 Family history of diabetes mellitus: Secondary | ICD-10-CM | POA: Diagnosis not present

## 2018-05-01 DIAGNOSIS — E039 Hypothyroidism, unspecified: Secondary | ICD-10-CM | POA: Diagnosis not present

## 2018-05-01 DIAGNOSIS — Z79899 Other long term (current) drug therapy: Secondary | ICD-10-CM | POA: Insufficient documentation

## 2018-05-01 DIAGNOSIS — Z823 Family history of stroke: Secondary | ICD-10-CM | POA: Diagnosis not present

## 2018-05-01 DIAGNOSIS — Z8261 Family history of arthritis: Secondary | ICD-10-CM | POA: Diagnosis not present

## 2018-05-01 DIAGNOSIS — M810 Age-related osteoporosis without current pathological fracture: Secondary | ICD-10-CM | POA: Diagnosis not present

## 2018-05-01 DIAGNOSIS — Z87891 Personal history of nicotine dependence: Secondary | ICD-10-CM | POA: Diagnosis not present

## 2018-05-01 DIAGNOSIS — M545 Low back pain: Secondary | ICD-10-CM | POA: Insufficient documentation

## 2018-05-01 DIAGNOSIS — Z87440 Personal history of urinary (tract) infections: Secondary | ICD-10-CM | POA: Diagnosis not present

## 2018-05-01 DIAGNOSIS — F419 Anxiety disorder, unspecified: Secondary | ICD-10-CM | POA: Diagnosis not present

## 2018-05-01 DIAGNOSIS — G8929 Other chronic pain: Secondary | ICD-10-CM | POA: Diagnosis not present

## 2018-05-01 DIAGNOSIS — I10 Essential (primary) hypertension: Secondary | ICD-10-CM | POA: Insufficient documentation

## 2018-05-01 DIAGNOSIS — M419 Scoliosis, unspecified: Secondary | ICD-10-CM | POA: Insufficient documentation

## 2018-05-01 DIAGNOSIS — Z801 Family history of malignant neoplasm of trachea, bronchus and lung: Secondary | ICD-10-CM | POA: Insufficient documentation

## 2018-05-01 DIAGNOSIS — Z96651 Presence of right artificial knee joint: Secondary | ICD-10-CM | POA: Diagnosis not present

## 2018-05-01 DIAGNOSIS — Z87310 Personal history of (healed) osteoporosis fracture: Secondary | ICD-10-CM | POA: Insufficient documentation

## 2018-05-01 DIAGNOSIS — E785 Hyperlipidemia, unspecified: Secondary | ICD-10-CM | POA: Diagnosis not present

## 2018-05-01 DIAGNOSIS — M47816 Spondylosis without myelopathy or radiculopathy, lumbar region: Secondary | ICD-10-CM

## 2018-05-01 NOTE — Progress Notes (Signed)
Subjective:    Patient ID: [name] [name], female    DOB: Oct 30, 1930, 84 y.o.   MRN: [id number]  HPI CC:  Low back pain  [address] out back door which resulted in rib fracture Diagnoses of osteoporosis "many years ago" was on Evista and Prolia.  Currently taking Tymlos sq, No f/u DEXA, pt concerned she cannot mount the table  Chronic pain over many years but did not become severe until ~4 yrs ago Last fall 12/2017- no fracture, was admitted to skilled nursing at a friend's home last  Takes Tramadol, 50 mg nightly Double dose of Tylenol and Aleve help her Takes Aleve on occasion  No pain radiating down legs, has leg weakness.  No bowel or bladder incont.  Mod I dressing but needs supervision getting in and out of shower.  Poor standing tolerance since L1-2 Kyphoplasty on 11/11/2016  T9 Vertebroplasty 07/18/2015 very helpful L1 and L2 Kyphoplasty 10/28/2016 not helpful T11 Kypho 12/24/2016 not helpful  Lives at [facility]  Takes tramadol at bedside  Last  PT  2019 after fall in June both in skilled area as well as home health              Pain Inventory Average Pain 3 Pain Right Now 8 My pain is burning, stabbing, aching and in legs  In the last 24 hours, has pain interfered with the following? General activity 10 Relation with others 10 Enjoyment of life 10 What TIME of day is your pain at its worst? morning Sleep (in general) Good  Pain is worse with: walking and standing Pain improves with: rest, heat/ice, medication and reclining in recliner Relief from Meds: "7 "with tylenol and aleve, takes 1/2 tramadol at night  Mobility use a walker ability to climb steps?  no do you drive?  no  Function retired I need assistance with the following:  dressing, bathing, meal prep, household duties and shopping  Neuro/Psych weakness trouble walking spasms dizziness confusion depression anxiety loss of taste or smell  Prior Studies Any changes since last  visit?  no  Physicians involved in your care Any changes since last visit?  no Dr Patrecia Pour Int Radiologist   Family History  Problem Relation Age of Onset  . Lung cancer Father 64       Died at age 62  . Stroke Mother 74       Died in her late 45s.  Marland Kitchen Heart attack Mother 71  . Diabetes Mother   . Arthritis Mother    Social History   Socioeconomic History  . Marital status: Widowed    Spouse name: [name]  . Number of children: 3  . Years of education: Not on file  . Highest education level: Not on file  Occupational History  . Occupation: homemaker  Social Needs  . Financial resource strain: Not hard at all  . Food insecurity:    Worry: Never true    Inability: Never true  . Transportation needs:    Medical: No    Non-medical: No  Tobacco Use  . Smoking status: Former Smoker    Years: 0.00    Types: Cigarettes    Last attempt to quit: 05/03/1965    Years since quitting: 53.0  . Smokeless tobacco: Never Used  Substance and Sexual Activity  . Alcohol use: No    Comment: occ glass wine  . Drug use: No  . Sexual activity: Not on file  Lifestyle  . Physical activity:  Days per week: 7 days    Minutes per session: 10 min  . Stress: To some extent  Relationships  . Social connections:    Talks on phone: More than three times a week    Gets together: More than three times a week    Attends religious service: Never    Active member of club or organization: No    Attends meetings of clubs or organizations: Never    Relationship status: Widowed  Other Topics Concern  . Not on file  Social History Narrative   She is a widowed mother of 3.   She is a former smoker, with a distant history having quit in 1966.   She does drink an occasional glass of red wine.   She is just now started to try doing exercise with water aerobics.   Past Surgical History:  Procedure Laterality Date  . BREAST SURGERY     left biopsy  . CATARACT EXTRACTION Right    2 weeks ago  .  CPET / MET - PFTS     Consistent with chronotropic incompetence; See attached report in the results section  . DILATION AND CURETTAGE OF UTERUS     x 2  . DOPPLER ECHOCARDIOGRAPHY  01/10/2013   Normal LV size and function. Normal EF. Air sclerosis but no stenosis  . ESOPHAGOGASTRODUODENOSCOPY  05/04/2012   Procedure: ESOPHAGOGASTRODUODENOSCOPY (EGD);  Surgeon: Inda Castle, MD;  Location: [facility];  Service: Endoscopy;  Laterality: N/A;  . EYE SURGERY     cataract extraction with ILO  ? eye  . IR KYPHO EA ADDL LEVEL THORACIC OR LUMBAR  10/28/2016  . IR KYPHO LUMBAR INC FX REDUCE BONE BX UNI/BIL CANNULATION INC/IMAGING  10/28/2016  . IR KYPHO THORACIC WITH BONE BIOPSY  12/24/2016  . IR RADIOLOGIST EVAL & MGMT  11/11/2016  . KNEE ARTHROSCOPY  2011   right  . TONSILLECTOMY    . TOTAL KNEE ARTHROPLASTY  04/24/2012   Procedure: TOTAL KNEE ARTHROPLASTY;  Surgeon: Gearlean Alf, MD;  Location: [facility];  Service: Orthopedics;  Laterality: Right;   Past Medical History:  Diagnosis Date  . Anemia   . Anxiety   . Chronotropic incompetence 12/2012    potentially medication related; noted on CPET   . DDD (degenerative disc disease)   . Eczema   . GERD (gastroesophageal reflux disease)   . H/O hiatal hernia   . Hx: UTI (urinary tract infection)   . Hyperlipidemia   . Hypertension   . Hypothyroidism   . Osteopenia   . Septicemia ([place]) 2002   following UTI  . Vertigo, benign positional    BP (!) 150/73   Pulse 74   Ht '5\' 1"'  (1.549 m) Comment: reported  Wt 176 lb (79.8 kg)   SpO2 96%   BMI 33.25 kg/m   Opioid Risk Score:   Fall Risk Score:  `1  Depression screen PHQ 2/9  Depression screen [facility] 2/9 05/01/2018 10/28/2017 09/14/2017 08/01/2017 06/29/2017  Decreased Interest 3 0 '1 3 1  ' Down, Depressed, Hopeless '3 1 3 3 1  ' PHQ - 2 Score '6 1 4 6 2  ' Altered sleeping 1 - 0 1 0  Tired, decreased energy 3 - '3 1 3  ' Change in appetite 0 - 0 0 0  Feeling bad or failure about yourself  0 - '3 3 1    ' Trouble concentrating 2 - 3 0 0  Moving slowly or fidgety/restless 2 - 1 0 3  Suicidal thoughts 0 - 0 0 0  PHQ-9 Score 14 - '14 11 9  ' Difficult doing work/chores Extremely dIfficult - Somewhat difficult Very difficult -    Review of Systems  Constitutional: Positive for unexpected weight change.  Eyes: Negative.   Respiratory: Positive for shortness of breath.   Cardiovascular: Positive for leg swelling.  Gastrointestinal: Positive for abdominal pain, diarrhea, nausea and vomiting.  Endocrine: Negative.   Genitourinary: Negative.   Musculoskeletal: Positive for back pain and gait problem.       Spasms  Skin: Negative.   Allergic/Immunologic: Negative.   Neurological: Positive for dizziness and weakness.       Hx vertigo  Hematological: Negative.   Psychiatric/Behavioral: Positive for confusion and dysphoric mood. The patient is nervous/anxious.   All other systems reviewed and are negative.      Objective:   Physical Exam  Constitutional: She is oriented to person, place, and time. She appears well-developed and well-nourished.  HENT:  Head: Normocephalic and atraumatic.  Neck: Normal range of motion.  Musculoskeletal: Normal range of motion.  Patient has normal hip range of motion no pain with hip range of motion.  No pain with knee range of motion is full range no pain with ankle range of motion Negative straight leg raising Tenderness palpation right PSIS.  No tenderness over the thoracic spinous processes or upper lumbar area.  Mild tenderness over the lumbar paraspinals around L4-5 area.   Neurological: She is alert and oriented to person, place, and time. She displays no tremor.  Reflex Scores:      Tricep reflexes are 0 on the right side and 0 on the left side.      Bicep reflexes are 1+ on the right side and 1+ on the left side.      Brachioradialis reflexes are 0 on the right side and 0 on the left side.      Patellar reflexes are 1+ on the right side and 1+ on  the left side.      Achilles reflexes are 0 on the right side and 0 on the left side. Motor strength is 4/5 bilateral deltoid bicep tricep grip hip flexor knee extensor ankle dorsiflexor Sensation intact light touch bilateral lower extremities and upper extremities  Skin: Skin is warm and dry.  Psychiatric: She has a normal mood and affect.  Nursing note and vitals reviewed.         Assessment & Plan:  1.  Chronic low back pain she has a history of osteoporosis with osteoporotic compression fractures.  Her current symptoms are not in the thoracolumbar area.  She does have a scoliosis mild dextroconvex at the lumbosacral junction.  Her pain is more in the PSIS area.  She has had no further falls or trauma.  Her pain is mainly with standing and relieved with sitting and laying down.  This is most likely facet mediated pain.  We will do medial branch blocks without corticosteroids.  She may be a candidate for radiofrequency neurotomy. Would not order any physical therapy until she gets some  improvement in her pain  Discussed with the patient and her daughter agree with plan.

## 2018-05-03 ENCOUNTER — Ambulatory Visit: Payer: Medicare Other | Admitting: Cardiology

## 2018-05-03 ENCOUNTER — Other Ambulatory Visit: Payer: Self-pay | Admitting: *Deleted

## 2018-05-03 ENCOUNTER — Encounter: Payer: Self-pay | Admitting: Cardiology

## 2018-05-03 ENCOUNTER — Other Ambulatory Visit: Payer: Self-pay

## 2018-05-03 VITALS — BP 126/54 | HR 94 | Ht 61.0 in | Wt 176.0 lb

## 2018-05-03 DIAGNOSIS — I4589 Other specified conduction disorders: Secondary | ICD-10-CM

## 2018-05-03 DIAGNOSIS — R0609 Other forms of dyspnea: Secondary | ICD-10-CM

## 2018-05-03 DIAGNOSIS — I10 Essential (primary) hypertension: Secondary | ICD-10-CM | POA: Diagnosis not present

## 2018-05-03 DIAGNOSIS — I351 Nonrheumatic aortic (valve) insufficiency: Secondary | ICD-10-CM

## 2018-05-03 DIAGNOSIS — R600 Localized edema: Secondary | ICD-10-CM

## 2018-05-03 DIAGNOSIS — Z79899 Other long term (current) drug therapy: Secondary | ICD-10-CM

## 2018-05-03 DIAGNOSIS — I119 Hypertensive heart disease without heart failure: Secondary | ICD-10-CM

## 2018-05-03 MED ORDER — ALPRAZOLAM 0.25 MG PO TABS: ORAL_TABLET | ORAL | 0 refills | Status: DC

## 2018-05-03 NOTE — Progress Notes (Signed)
 PCP: Pandey, Mahima, MD  Clinic Note: Chief Complaint  Patient presents with  . Follow-up    No major complaints.  Edema controlled now.  Had switched to torsemide    HPI: [name] [name] is a 84 y.o. female with a PMH below who presents today for annual follow-up for hypertension and chronotropic incompetence..  Chrisann J Saidi was last seen in Oct 2018 - was stable from a CV standpoint.  Tons of pain from back fracture.  Not using much PRN Lasix.  Still noted poor energy levels.   really doing well without any major complaints. She'll and notes that she would "give out energy "trying to stay balanced. She is having exertional dyspnea from COPD, but no chest tightness or pressure.  Recent Hospitalizations:   6/22 - fall, [facility]  Studies Personally Reviewed - (if available, images/films reviewed: From Epic Chart or Care Everywhere)  TTE 03/24/2018: Ordered by PCP for "congestive heart failure " -EF 60 M 65%.  GR 1 DD.  No R WMA.  Interval History: Roan returns today pretty much stable.  She is using her support stockings that are the zipper type and her edema has been pretty well controlled.  The swelling kind of comes and goes.  She has been switched from Lasix to torsemide.  But has not had her labs rechecked recently.  She still has some dizzy spells, has some musculoskeletal pains but no syncope or near syncope.  No TIA or amaurosis fugax symptoms.  No chest pain or pressure with rest or exertion.  Does not do much walking but does get around some with her rolling walker.  She gets short of breath partially because she now has pretty significant kyphoscoliosis.  Despite having edema she really denies any PND orthopnea.  No rapid irregular heartbeats or palpitations.  Part of the reason why she is short of breath is because it does take her quite a bit of effort to move around.  Thankfully, her back has stabilized now and is not as painful.   No claudication.   ROS: A comprehensive  was performed. Review of Systems  Constitutional: Negative for malaise/fatigue (Not full of energy, but not fatigued.).  HENT: Negative for congestion.   Respiratory: Positive for shortness of breath (Stable). Negative for cough.   Cardiovascular: Negative for claudication.  Gastrointestinal: Negative for abdominal pain, constipation and heartburn.  Genitourinary: Negative for dysuria.  Musculoskeletal: Positive for back pain. Negative for falls (No reasonable).  Neurological: Positive for dizziness and weakness (Global).  Endo/Heme/Allergies: Negative for environmental allergies.  Psychiatric/Behavioral: Positive for memory loss. Negative for depression. The patient is not nervous/anxious and does not have insomnia (Sleeping better.  Back pain is better.).   All other systems reviewed and are negative.   I have reviewed and (if needed) personally updated the patient's problem list, medications, allergies, past medical and surgical history, social and family history.   Past Medical History:  Diagnosis Date  . Anemia   . Anxiety   . Chronotropic incompetence 12/2012    potentially medication related; noted on CPET   . DDD (degenerative disc disease)   . Eczema   . GERD (gastroesophageal reflux disease)   . H/O hiatal hernia   . Hx: UTI (urinary tract infection)   . Hyperlipidemia   . Hypertension   . Hypothyroidism   . Osteopenia   . Septicemia (HCC) 2002   following UTI  . Vertigo, benign positional     Past Surgical History:  Procedure   Laterality Date  . BREAST SURGERY     left biopsy  . CATARACT EXTRACTION Right    2 weeks ago  . CPET / MET - PFTS     Consistent with chronotropic incompetence; See attached report in the results section  . DILATION AND CURETTAGE OF UTERUS     x 2  . DOPPLER ECHOCARDIOGRAPHY  01/10/2013   Normal LV size and function. Normal EF. Air sclerosis but no stenosis  . ESOPHAGOGASTRODUODENOSCOPY  05/04/2012   Procedure:  ESOPHAGOGASTRODUODENOSCOPY (EGD);  Surgeon: Robert D Kaplan, MD;  Location: [facility];  Service: Endoscopy;  Laterality: N/A;  . EYE SURGERY     cataract extraction with ILO  ? eye  . IR KYPHO EA ADDL LEVEL THORACIC OR LUMBAR  10/28/2016  . IR KYPHO LUMBAR INC FX REDUCE BONE BX UNI/BIL CANNULATION INC/IMAGING  10/28/2016  . IR KYPHO THORACIC WITH BONE BIOPSY  12/24/2016  . IR RADIOLOGIST EVAL & MGMT  11/11/2016  . KNEE ARTHROSCOPY  2011   right  . TONSILLECTOMY    . TOTAL KNEE ARTHROPLASTY  04/24/2012   Procedure: TOTAL KNEE ARTHROPLASTY;  Surgeon: Frank V Aluisio, MD;  Location: [facility];  Service: Orthopedics;  Laterality: Right;  . TRANSTHORACIC ECHOCARDIOGRAM  02/2018   Ordered by PCP for "congestive heart failure " -EF 60 M 65%.  GR 1 DD.  No R WMA--- > ESSENTIALLY NORMAL    Current Meds  Medication Sig  . acetaminophen (TYLENOL) 500 MG tablet Take 2 tablets (1,000 mg total) by mouth 3 (three) times daily.  . amLODipine (NORVASC) 10 MG tablet Take 1 tablet (10 mg total) by mouth daily.  . buPROPion (WELLBUTRIN SR) 150 MG 12 hr tablet Take 1 tablet (150 mg total) by mouth 2 (two) times daily after a meal.  . calcium carbonate (OS-CAL) 600 MG TABS tablet Take 1 tablet (600 mg total) by mouth 2 (two) times daily with a meal.  . cholecalciferol (VITAMIN D) 1000 UNITS tablet Take 1,000 Units by mouth daily.  . docusate sodium (COLACE) 100 MG capsule Take 100 mg by mouth 2 (two) times daily.  . fish oil-omega-3 fatty acids 1000 MG capsule Take 1 g by mouth daily.  . hydrALAZINE (APRESOLINE) 25 MG tablet Take 1 tablet (25 mg total) by mouth 2 (two) times daily.  . levothyroxine (SYNTHROID, LEVOTHROID) 75 MCG tablet TAKE 1 TABLET (75 MCG TOTAL) BY MOUTH DAILY BEFORE BREAKFAST.  . loperamide (IMODIUM A-D) 2 MG tablet Take 2 mg by mouth as needed for diarrhea or loose stools.  . losartan (COZAAR) 100 MG tablet TAKE 1 TABLET BY MOUTH  DAILY  . Multiple Vitamin (MULTIVITAMIN WITH MINERALS) TABS tablet  Take 1 tablet by mouth daily.  . nitrofurantoin (MACRODANTIN) 50 MG capsule Take 1 capsule (50 mg total) by mouth daily.  . NON FORMULARY Take 12,600 mg by mouth daily as needed (Take 1 capsule). Triple Strength Cranberry  . omeprazole (PRILOSEC) 40 MG capsule Take 40 mg by mouth daily.  . potassium chloride SA (K-DUR,KLOR-CON) 20 MEQ tablet Take 1 tablet (20 mEq total) by mouth 2 (two) times daily.  . sennosides-docusate sodium (SENOKOT-S) 8.6-50 MG tablet Take 2 tablets by mouth at bedtime.  . torsemide (DEMADEX) 20 MG tablet Take 1 tablet (20 mg total) by mouth 2 (two) times daily.  . traMADol (ULTRAM) 50 MG tablet TAKE 1/2 TABLET BY MOUTH AT BEDTIME  . TYMLOS 3120 MCG/1.56ML SOPN INJECT 80MCG SUBCUTANEOUSLY ONCE DAILY  . zinc oxide 20 % ointment   Apply 1 application topically as needed for irritation.  . [DISCONTINUED] ALPRAZolam (XANAX) 0.25 MG tablet Take 0.5 mg by mouth daily as needed for anxiety. Take half a tablet    No Known Allergies  Social History   Tobacco Use  . Smoking status: Former Smoker    Years: 0.00    Types: Cigarettes    Last attempt to quit: 05/03/1965    Years since quitting: 53.0  . Smokeless tobacco: Never Used  Substance Use Topics  . Alcohol use: No    Comment: occ glass wine  . Drug use: No   Social History   Social History Narrative   She is a widowed mother of 3.   She is a former smoker, with a distant history having quit in 1966.   She does drink an occasional glass of red wine.   She is just now started to try doing exercise with water aerobics.     family history includes Arthritis in her mother; Diabetes in her mother; Heart attack (age of onset: 54) in her mother; Lung cancer (age of onset: 39) in her father; Stroke (age of onset: 45) in her mother.  Wt Readings from Last 3 Encounters:  05/03/18 176 lb (79.8 kg)  05/01/18 176 lb (79.8 kg)  04/10/18 178 lb 6.4 oz (80.9 kg)    PHYSICAL EXAM BP (!) 126/54   Pulse 94   Ht 5' 1"  (1.549 m)   Wt 176 lb (79.8 kg)   SpO2 97%   BMI 33.25 kg/m  Physical Exam  Constitutional: She is oriented to person, place, and time. She appears well-developed and well-nourished. No distress.  Quite uncomfortable.  HENT:  Head: Normocephalic and atraumatic.  Neck: No hepatojugular reflux and no JVD present. Carotid bruit is not present.  Cardiovascular: Normal rate, regular rhythm and intact distal pulses.  No extrasystoles are present. PMI is not displaced. Exam reveals no gallop and no friction rub.  Murmur heard.  Medium-pitched harsh crescendo-decrescendo early systolic murmur is present with a grade of 2/6 at the upper right sternal border radiating to the neck. Pulmonary/Chest: Effort normal and breath sounds normal. No respiratory distress. She has no wheezes. She has no rales.  Abdominal: Soft. Bowel sounds are normal. She exhibits no distension. There is no tenderness. There is no rebound.  Musculoskeletal: Normal range of motion. She exhibits no edema (Wearing support stockings.).  Significant thoracic kyphoscoliosis noted.  Neurological: She is alert and oriented to person, place, and time.  Psychiatric: She has a normal mood and affect. Her behavior is normal. Judgment and thought content normal.  Vitals reviewed.   Adult ECG Report n/a  Other studies Reviewed: Additional studies/ records that were reviewed today include:  Recent Labs: Checked today. Lab Results  Component Value Date   CREATININE 0.99 05/03/2018   BUN 36 (H) 05/03/2018   NA 135 05/03/2018   K 4.5 05/03/2018   CL 95 (L) 05/03/2018   CO2 24 05/03/2018   Lab Results  Component Value Date   CHOL 212 (H) 12/09/2017   HDL 72 12/09/2017   LDLCALC 121 (H) 12/09/2017   TRIG 88 12/09/2017   CHOLHDL 2.9 12/09/2017    ASSESSMENT / PLAN: Problem List Items Addressed This Visit    Aortic ejection murmur (Chronic)    Likely related to aortic stenosis.  No comment on valve stenosis.      Bilateral  leg edema (Chronic)    Stable.  Probably venous stasis related.  Continue to  wear support stockings.  Better controlled on torsemide now.  Has not had labs checked.  We will check a BMP today.      Chronotropic incompetence (Chronic)    Avoid AV Nodal agents.  -- HR currently 94 bpm.  No indication for PPM.      Essential hypertension (Chronic)    Blood pressures well controlled today.  She does have some dizzy spells off and on and I told her that if that were to occur she can hold her hydralazine. Also if blood pressure is high (greater than 170 mmHg can take an additional dose). Otherwise continue Cozaar and amlodipine.  It seems as though her orthostatic symptoms have improved -possibly related to support stockings.      Exertional dyspnea (Chronic)    I think this probably more related to her deconditioning and kyphoscoliosis then anything related to her heart.  Talked about how to stretch out her thoracic cavity to improve restaurant status and avoid restrictive lung disease from kyphosis.      Hypertensive heart disease - Primary (Chronic)    Not unexpectedly has grade 1 diastolic dysfunction which in an 84 year old but is normal for age. I do not think that her edema is related to heart failure as much as it is due to venous stasis.  Blood pressures currently well controlled but would not be overly aggressive because she has had some episodes of orthostatic dizziness in the past.       Other Visit Diagnoses    Medication management       Relevant Orders   Basic metabolic panel (Completed)      Current medicines are reviewed at length with the patient today. (+/- concerns) n/a The following changes have been made: n/a  Patient Instructions  Medication Instructions:  Your Physician recommend you continue on your current medication as directed.   -Ok to hold hydralazine if you are feeling light head or dizzy. If Blood Pressure (top number)  Greater than 170 ok to  take and extra dose  If you need a refill on your cardiac medications before your next appointment, please call your pharmacy.   Lab work: Your physician recommends that you return for lab work today (BMP)  If you have labs (blood work) drawn today and your tests are completely normal, you will receive your results only by: Marland Kitchen MyChart Message (if you have MyChart) OR . A paper copy in the mail If you have any lab test that is abnormal or we need to change your treatment, we will call you to review the results.  Testing/Procedures: None  Follow-Up: At [facility], you and your health needs are our priority.  As part of our continuing mission to provide you with exceptional heart care, we have created designated Provider Care Teams.  These Care Teams include your primary Cardiologist (physician) and Advanced Practice Providers (APPs -  Physician Assistants and Nurse Practitioners) who all work together to provide you with the care you need, when you need it. You will need a follow up appointment in 1 years.  Please call our office 2 months in advance to schedule this appointment.  You may see Dr. Ellyn Hack or one of the following Advanced Practice Providers on your designated Care Team:   Rosaria Ferries, PA-C . Jory Sims, DNP, ANP  Any Other Special Instructions Will Be Listed Below (If Applicable).      Studies Ordered:   Orders Placed This Encounter  Procedures  . Basic metabolic panel   , M.D., M.S. Interventional Cardiologist   Pager # [phone] Phone # [phone] [address] [place], [place] [address] 

## 2018-05-03 NOTE — Telephone Encounter (Signed)
Patient requested refill.  Confirmed dosage with patient and phone to pharmacy.

## 2018-05-03 NOTE — Patient Instructions (Signed)
Medication Instructions:  Your Physician recommend you continue on your current medication as directed.   -Ok to hold hydralazine if you are feeling light head or dizzy. If Blood Pressure (top number)  Greater than 170 ok to take and extra dose  If you need a refill on your cardiac medications before your next appointment, please call your pharmacy.   Lab work: Your physician recommends that you return for lab work today (BMP)  If you have labs (blood work) drawn today and your tests are completely normal, you will receive your results only by: Marland Kitchen MyChart Message (if you have MyChart) OR . A paper copy in the mail If you have any lab test that is abnormal or we need to change your treatment, we will call you to review the results.  Testing/Procedures: None  Follow-Up: At [facility], you and your health needs are our priority.  As part of our continuing mission to provide you with exceptional heart care, we have created designated Provider Care Teams.  These Care Teams include your primary Cardiologist (physician) and Advanced Practice Providers (APPs -  Physician Assistants and Nurse Practitioners) who all work together to provide you with the care you need, when you need it. You will need a follow up appointment in 1 years.  Please call our office 2 months in advance to schedule this appointment.  You may see Dr. Herbie Baltimore or one of the following Advanced Practice Providers on your designated Care Team:   Theodore Demark, PA-C . Joni Reining, DNP, ANP  Any Other Special Instructions Will Be Listed Below (If Applicable).

## 2018-05-04 LAB — BASIC METABOLIC PANEL WITH GFR
BUN/Creatinine Ratio: 36 — ABNORMAL HIGH (ref 12–28)
BUN: 36 mg/dL — ABNORMAL HIGH (ref 8–27)
CO2: 24 mmol/L (ref 20–29)
Calcium: 9.6 mg/dL (ref 8.7–10.3)
Chloride: 95 mmol/L — ABNORMAL LOW (ref 96–106)
Creatinine, Ser: 0.99 mg/dL (ref 0.57–1.00)
GFR calc Af Amer: 60 mL/min/1.73
GFR calc non Af Amer: 52 mL/min/1.73 — ABNORMAL LOW
Glucose: 106 mg/dL — ABNORMAL HIGH (ref 65–99)
Potassium: 4.5 mmol/L (ref 3.5–5.2)
Sodium: 135 mmol/L (ref 134–144)

## 2018-05-04 LAB — BASIC METABOLIC PANEL
BUN/Creatinine Ratio: 34 — ABNORMAL HIGH (ref 12–28)
BUN: 35 mg/dL — AB (ref 8–27)
CALCIUM: 9.7 mg/dL (ref 8.7–10.3)
CO2: 23 mmol/L (ref 20–29)
Chloride: 97 mmol/L (ref 96–106)
Creatinine, Ser: 1.04 mg/dL — ABNORMAL HIGH (ref 0.57–1.00)
GFR calc Af Amer: 57 mL/min/{1.73_m2} — ABNORMAL LOW (ref >59)
GFR, EST NON AFRICAN AMERICAN: 49 mL/min/{1.73_m2} — AB (ref >59)
Glucose: 108 mg/dL — ABNORMAL HIGH (ref 65–99)
POTASSIUM: 4.6 mmol/L (ref 3.5–5.2)
Sodium: 137 mmol/L (ref 134–144)

## 2018-05-05 ENCOUNTER — Encounter: Payer: Self-pay | Admitting: Cardiology

## 2018-05-05 NOTE — Assessment & Plan Note (Signed)
Stable.  Probably venous stasis related.  Continue to wear support stockings.  Better controlled on torsemide now.  Has not had labs checked.  We will check a BMP today.

## 2018-05-05 NOTE — Assessment & Plan Note (Signed)
Likely related to aortic stenosis.  No comment on valve stenosis.

## 2018-05-05 NOTE — Assessment & Plan Note (Addendum)
Blood pressures well controlled today.  She does have some dizzy spells off and on and I told her that if that were to occur she can hold her hydralazine. Also if blood pressure is high (greater than 170 mmHg can take an additional dose). Otherwise continue Cozaar and amlodipine.  It seems as though her orthostatic symptoms have improved -possibly related to support stockings.

## 2018-05-05 NOTE — Assessment & Plan Note (Signed)
I think this probably more related to her deconditioning and kyphoscoliosis then anything related to her heart.  Talked about how to stretch out her thoracic cavity to improve restaurant status and avoid restrictive lung disease from kyphosis.

## 2018-05-05 NOTE — Assessment & Plan Note (Signed)
Avoid AV Nodal agents.  -- HR currently 94 bpm.  No indication for PPM.

## 2018-05-05 NOTE — Assessment & Plan Note (Signed)
Not unexpectedly has grade 1 diastolic dysfunction which in an 84 year old but is normal for age. I do not think that her edema is related to heart failure as much as it is due to venous stasis.  Blood pressures currently well controlled but would not be overly aggressive because she has had some episodes of orthostatic dizziness in the past.

## 2018-05-06 IMAGING — CT CT T SPINE W/O CM
2 series · 11 of 20 positions shown, 13 images · non-contrast
Comparison: Thoracic spine MRI 07/02/2015, no interval imaging.

CLINICAL DATA: Sudden onset of low thoracic spine pain and
tenderness. History of compression fracture.

EXAM:
CT THORACIC SPINE WITHOUT CONTRAST
TECHNIQUE: Multidetector CT images of the thoracic were obtained using the
standard protocol without intravenous contrast. Imaging obtained
from lower cervical spine through L2.

[Series 6: coronal · coronal · 0.29mm/px · 3 of 75 slices shown]
[im 15/75  bone]
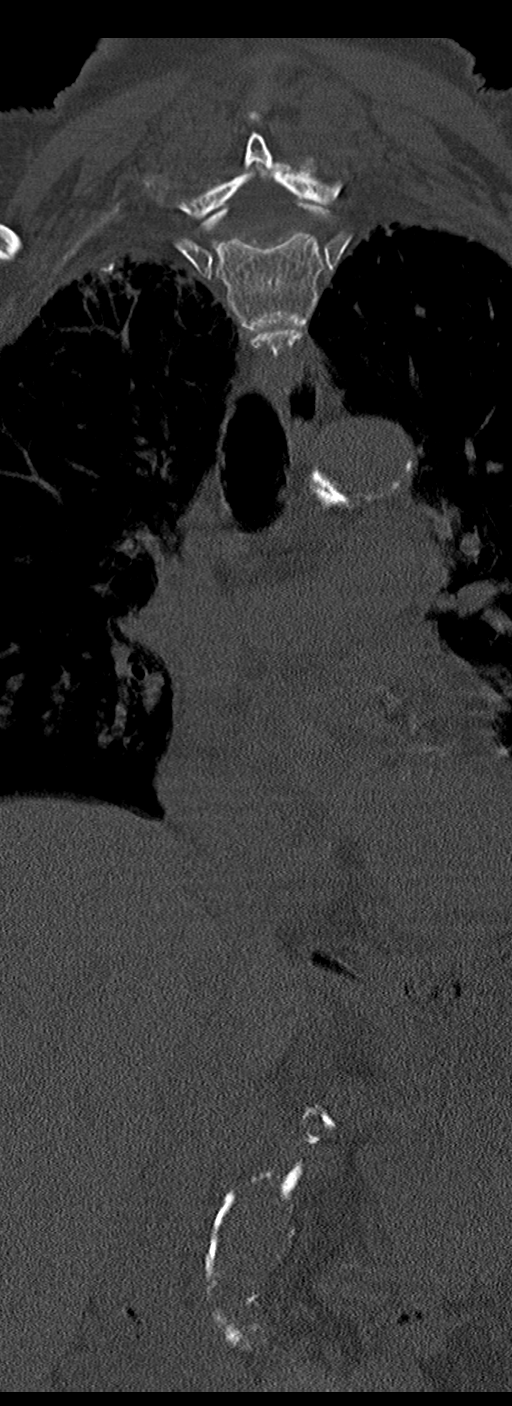
[im 30/75  bone]
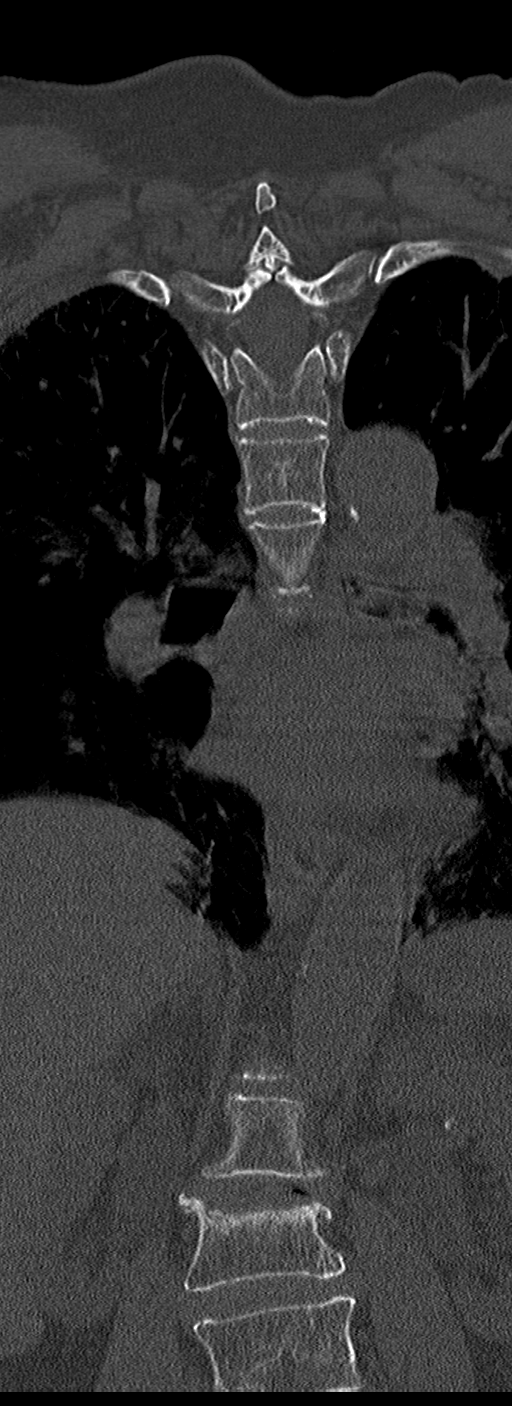
[im 45/75  bone]
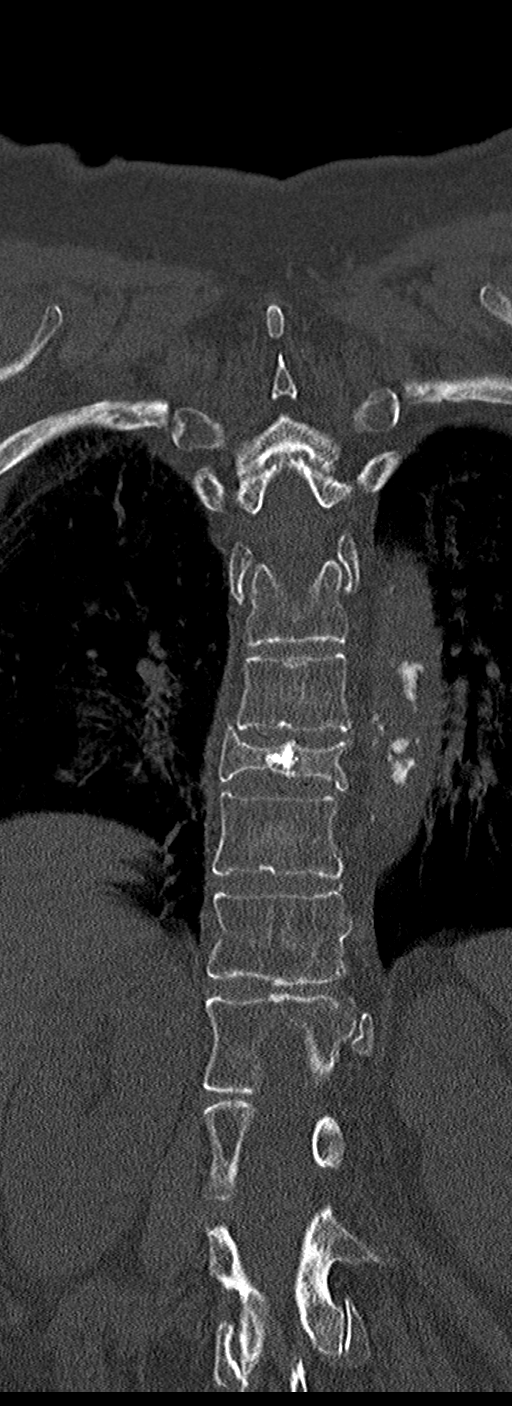

[Series 8: t-spine st · axial · 0.37mm/px · z∈[+1136,+1460]mm · 8 of 192 slices shown, 10 images]
[im 15/192  soft-tissue]
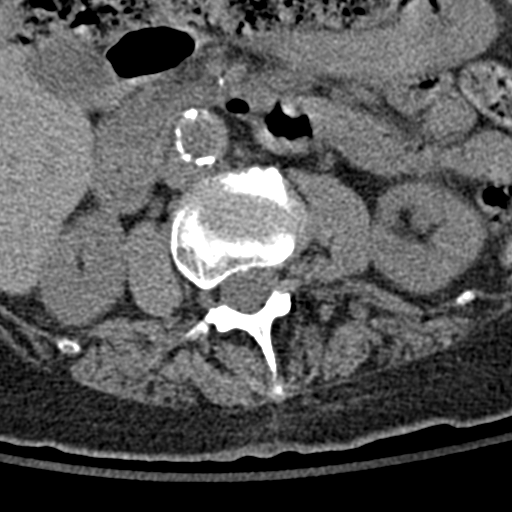
[im 15/192  bone]
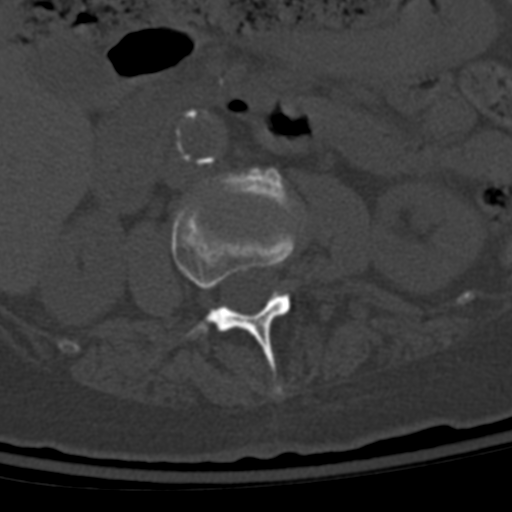
[im 45/192  bone]
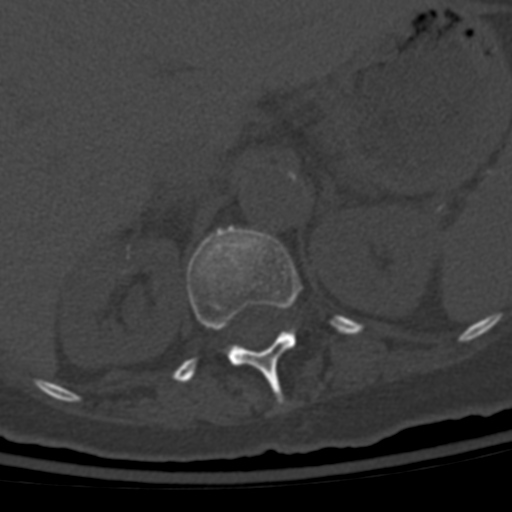
[im 59/192  bone]
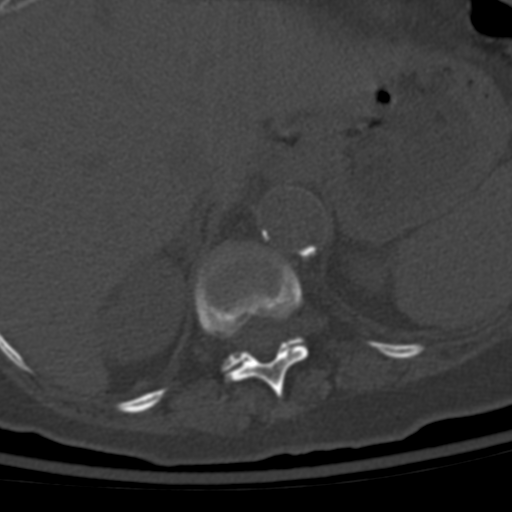
[im 89/192  bone]
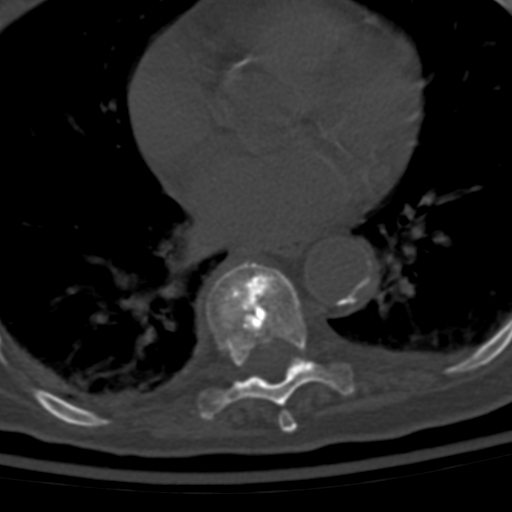
[im 103/192  soft-tissue]
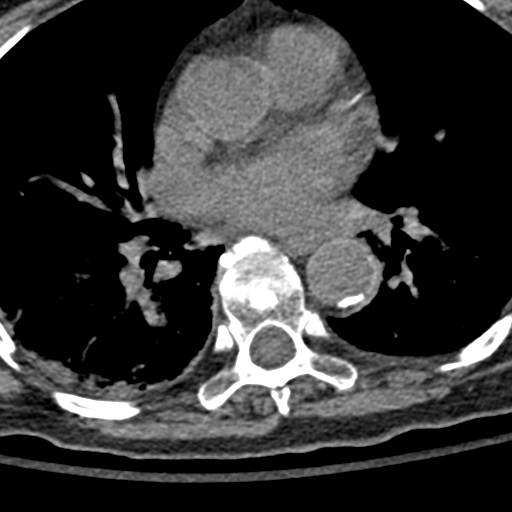
[im 103/192  bone]
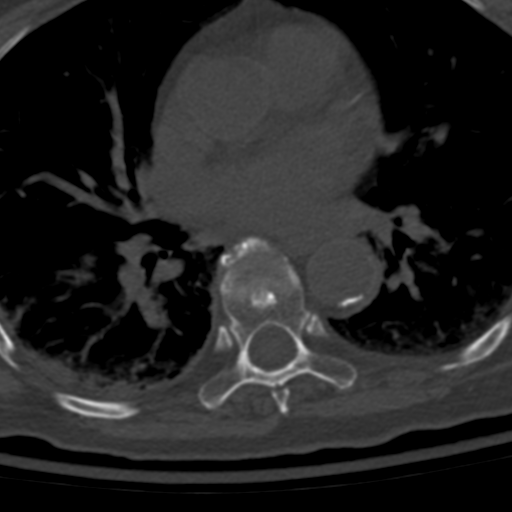
[im 133/192  bone]
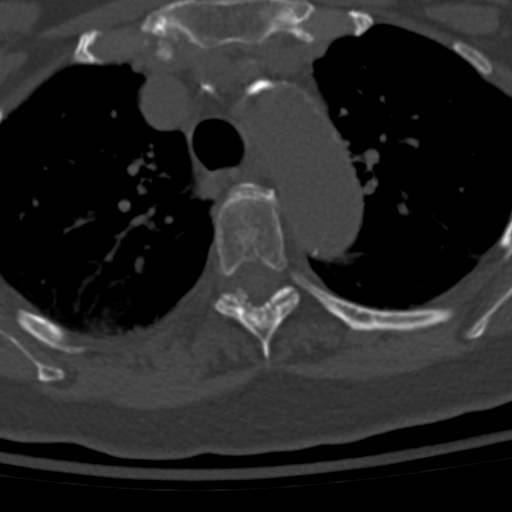
[im 147/192  bone]
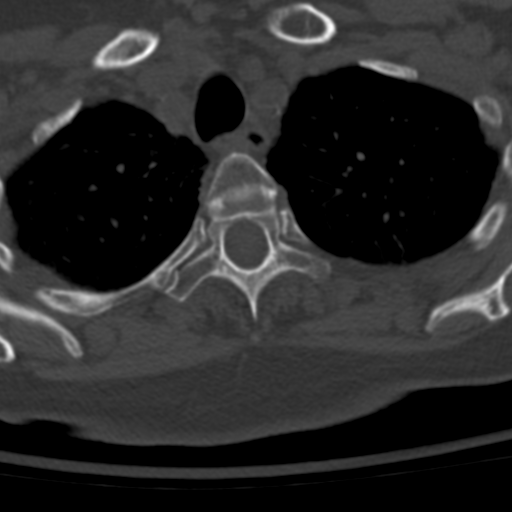
[im 177/192  bone]
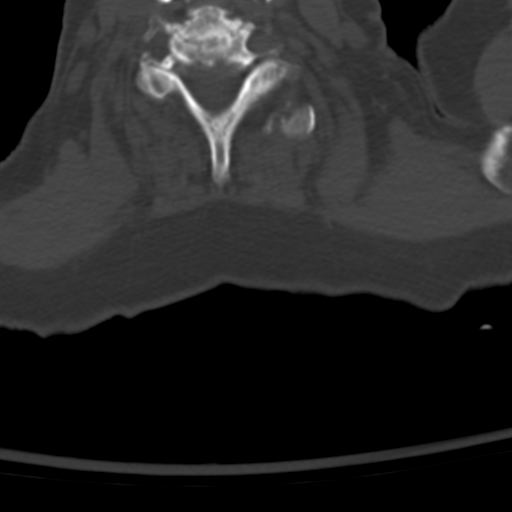

[11 of 20 positions shown; findings below may reference images not displayed]

FINDINGS: Alignment: Mild exaggerated kyphosis, unchanged.

Vertebrae: Mild superior endplate compression fracture of L2 with
25% loss of height anteriorly. There is minimal bony retropulsion
superiorly. No evidence of extension through the posterior elements.
Prominent Schmorl's node inferior endplate of L1. Remote T9 fracture
with 75% loss of height and posterior element involvement. There has
been vertebroplasty. Unchanged loss of height from prior. No
remaining thoracic vertebral body heights are maintained scattered
Schmorl's nodes. No bony destructive process.

Paraspinal and other soft tissues: No canal hematoma. Linear and
dependent lung base opacities favor atelectasis. Advanced
atherosclerosis of the thoracoabdominal aorta.

Disc levels: Thoracic disc spaces are preserved. Mild endplate
spurring. Vacuum phenomenon noted at L1-L2.
IMPRESSION: 1. Acute appearing L2 compression fracture with 25% loss of height
anteriorly and mild bony retropulsion posteriorly.
2. Stable T9 compression fracture post vertebroplasty.

## 2018-05-08 ENCOUNTER — Other Ambulatory Visit: Payer: Self-pay | Admitting: *Deleted

## 2018-05-08 MED ORDER — TORSEMIDE 20 MG PO TABS: 20.0000 mg | ORAL_TABLET | Freq: Two times a day (BID) | ORAL | 0 refills | Status: DC

## 2018-05-08 NOTE — Telephone Encounter (Signed)
Patient caregiver requested Rx to be faxed to [facility]. Faxed.

## 2018-05-08 NOTE — Telephone Encounter (Signed)
[name] [name] called requesting Rx to be sent for refill. No pharmacy stated. Pended rx and called caregiver and LMOM to return call regarding which pharmacy to send Rx to. Awaiting callback.

## 2018-05-09 ENCOUNTER — Non-Acute Institutional Stay: Payer: Medicare Other | Admitting: Family

## 2018-05-09 ENCOUNTER — Encounter: Payer: Self-pay | Admitting: Family

## 2018-05-09 VITALS — BP 158/76 | HR 68 | Temp 97.4°F | Ht 61.0 in | Wt 176.2 lb

## 2018-05-09 DIAGNOSIS — E039 Hypothyroidism, unspecified: Secondary | ICD-10-CM | POA: Diagnosis not present

## 2018-05-09 DIAGNOSIS — F418 Other specified anxiety disorders: Secondary | ICD-10-CM | POA: Diagnosis not present

## 2018-05-09 DIAGNOSIS — M545 Low back pain, unspecified: Secondary | ICD-10-CM

## 2018-05-09 DIAGNOSIS — I5032 Chronic diastolic (congestive) heart failure: Secondary | ICD-10-CM

## 2018-05-09 DIAGNOSIS — I110 Hypertensive heart disease with heart failure: Secondary | ICD-10-CM | POA: Diagnosis not present

## 2018-05-09 DIAGNOSIS — G8929 Other chronic pain: Secondary | ICD-10-CM

## 2018-05-09 DIAGNOSIS — E782 Mixed hyperlipidemia: Secondary | ICD-10-CM | POA: Diagnosis not present

## 2018-05-09 NOTE — Patient Instructions (Signed)
1. Continue on Aquatic exercise 2. Follow up in 3 months lab work to be drawn one week prior to visit.

## 2018-05-16 NOTE — Progress Notes (Signed)
Location:  [facility] Room Number: [name]:  Clinic (12) Provider: Hugo Lybrand FNP-C   Blanchie Serve, MD  Patient Care Team: Blanchie Serve, MD as PCP - General (Internal Medicine)  Extended Emergency Contact Information Primary Emergency Contact: Peil,[name] Address: [place], [place] [address] [place] Phone: [phone] Mobile Phone: [phone] Relation: Son Secondary Emergency Contact: Utz,[name]  [place] Phone: [phone] Relation: None   Goals of care: Advanced Directive information Advanced Directives 04/10/2018  Does Patient Have a Medical Advance Directive? Yes  Type of Advance Directive Out of facility DNR (pink MOST or yellow form)  Does patient want to make changes to medical advance directive? -  Copy of [place] in Chart? -  Pre-existing out of facility DNR order (yellow form or pink MOST form) Yellow form placed in chart (order not valid for inpatient use)     Chief Complaint  Patient presents with  . Medical Management of Chronic Issues    Patient here today with her daughter in law for follow up. She would like to discuss her lab results. She is still haing trouble with her balance. She has 4 days without any pain after doing some breathing exercises recommended by her cardiologist. She is scheduled for an injection Oct. 28th with her pain management doctor.     HPI:  Pt is a 84 y.o. female seen [facility] for medical management of chronic diseases.she is here today with her daughter in law who also contributes to HPI information.she has a medical history of HTN,chronic CHF,Hyperlipidemia,depression,anxiety,hypothyroidism,chronic back pain among other conditions.she was recently seen by Cardiology on 05/03/2018 for annual follow up for hypertension and chronotropic incompetence.she was complained of dizziness at times and  was encouraged to hold her hydralazine.also instructed to take an extra dose of hydralazine if SBP> 170.she denies any symptoms of dizziness,headache,blurry vision,nausea or vomiting.she also reported  some exertional dyspnea thought probably from her deconditioning and kyphosis/scoliosis.she was instructed on stretching thoracic cavity exercise.she states exercise has help with her pain.she is also scheduled for cortisone injection with pain management.she states pain stable this visit.she denies any recent acute illness or hospital admission.No fall episode reported.   Past Medical History:  Diagnosis Date  . Anemia   . Anxiety   . Chronotropic incompetence 12/2012    potentially medication related; noted on CPET   . DDD (degenerative disc disease)   . Eczema   . GERD (gastroesophageal reflux disease)   . H/O hiatal hernia   . Hx: UTI (urinary tract infection)   . Hyperlipidemia   . Hypertension   . Hypothyroidism   . Osteopenia   . Septicemia ([place]) 2002   following UTI  . Vertigo, benign positional    Past Surgical History:  Procedure Laterality Date  . BREAST SURGERY     left biopsy  . CATARACT EXTRACTION Right    2 weeks ago  . CPET / MET - PFTS     Consistent with chronotropic incompetence; See attached report in the results section  . DILATION AND CURETTAGE OF UTERUS     x 2  . DOPPLER ECHOCARDIOGRAPHY  01/10/2013   Normal LV size and function. Normal EF. Air sclerosis but no stenosis  . ESOPHAGOGASTRODUODENOSCOPY  05/04/2012   Procedure: ESOPHAGOGASTRODUODENOSCOPY (EGD);  Surgeon: Inda Castle, MD;  Location: [facility];  Service: Endoscopy;  Laterality:  N/A;  . EYE SURGERY     cataract extraction with ILO  ? eye  . IR KYPHO EA ADDL LEVEL THORACIC OR LUMBAR  10/28/2016  . IR KYPHO LUMBAR INC FX REDUCE BONE BX UNI/BIL CANNULATION INC/IMAGING  10/28/2016  . IR KYPHO THORACIC WITH BONE BIOPSY  12/24/2016  . IR RADIOLOGIST EVAL & MGMT  11/11/2016  . KNEE ARTHROSCOPY  2011     right  . TONSILLECTOMY    . TOTAL KNEE ARTHROPLASTY  04/24/2012   Procedure: TOTAL KNEE ARTHROPLASTY;  Surgeon: Gearlean Alf, MD;  Location: [facility];  Service: Orthopedics;  Laterality: Right;  . TRANSTHORACIC ECHOCARDIOGRAM  02/2018   Ordered by PCP for "congestive heart failure " -EF 60 M 65%.  GR 1 DD.  No R WMA--- > ESSENTIALLY NORMAL    No Known Allergies  Allergies as of 05/09/2018   No Known Allergies     Medication List        Accurate as of 05/09/18 11:59 PM. Always use your most recent med list.          acetaminophen 500 MG tablet Commonly known as:  TYLENOL Take 2 tablets (1,000 mg total) by mouth 3 (three) times daily.   ALPRAZolam 0.25 MG tablet Commonly known as:  XANAX Take half  tablet by mouth once daily at bedtime as needed for rest   amLODipine 10 MG tablet Commonly known as:  NORVASC Take 1 tablet (10 mg total) by mouth daily.   buPROPion 150 MG 12 hr tablet Commonly known as:  WELLBUTRIN SR Take 1 tablet (150 mg total) by mouth 2 (two) times daily after a meal.   calcium carbonate 600 MG Tabs tablet Commonly known as:  OS-CAL Take 1 tablet (600 mg total) by mouth 2 (two) times daily with a meal.   cholecalciferol 1000 units tablet Commonly known as:  VITAMIN D Take 1,000 Units by mouth daily.   docusate sodium 100 MG capsule Commonly known as:  COLACE Take 100 mg by mouth 2 (two) times daily.   fish oil-omega-3 fatty acids 1000 MG capsule Take 1 g by mouth daily.   hydrALAZINE 25 MG tablet Commonly known as:  APRESOLINE Take 1 tablet (25 mg total) by mouth 2 (two) times daily.   levothyroxine 75 MCG tablet Commonly known as:  SYNTHROID, LEVOTHROID TAKE 1 TABLET (75 MCG TOTAL) BY MOUTH DAILY BEFORE BREAKFAST.   loperamide 2 MG tablet Commonly known as:  IMODIUM A-D Take 2 mg by mouth as needed for diarrhea or loose stools.   losartan 100 MG tablet Commonly known as:  COZAAR TAKE 1 TABLET BY MOUTH  DAILY   multivitamin with  minerals Tabs tablet Take 1 tablet by mouth daily.   nitrofurantoin 50 MG capsule Commonly known as:  MACRODANTIN Take 1 capsule (50 mg total) by mouth daily.   NON FORMULARY Take 12,600 mg by mouth daily as needed (Take 1 capsule). Triple Strength Cranberry   omeprazole 40 MG capsule Commonly known as:  PRILOSEC Take 40 mg by mouth daily.   potassium chloride SA 20 MEQ tablet Commonly known as:  K-DUR,KLOR-CON Take 1 tablet (20 mEq total) by mouth 2 (two) times daily.   sennosides-docusate sodium 8.6-50 MG tablet Commonly known as:  SENOKOT-S Take 2 tablets by mouth at bedtime.   torsemide 20 MG tablet Commonly known as:  DEMADEX Take 1 tablet (20 mg total) by mouth 2 (two) times daily.   traMADol 50 MG tablet Commonly known as:  ULTRAM TAKE 1/2 TABLET BY MOUTH AT BEDTIME   TYMLOS 3120 MCG/1.56ML Sopn Generic drug:  Abaloparatide INJECT 80MCG SUBCUTANEOUSLY ONCE DAILY   zinc oxide 20 % ointment Apply 1 application topically as needed for irritation.       Review of Systems  Constitutional: Negative for appetite change, chills, fatigue, fever and unexpected weight change.  HENT: Negative for congestion, rhinorrhea, sinus pressure, sinus pain, sneezing and sore throat.   Eyes: Negative for discharge, redness and itching.  Respiratory: Negative for cough, chest tightness, shortness of breath and wheezing.   Cardiovascular: Positive for leg swelling. Negative for chest pain and palpitations.  Gastrointestinal: Negative for abdominal distention, abdominal pain, constipation, diarrhea, nausea and vomiting.  Endocrine: Negative for cold intolerance, heat intolerance, polydipsia, polyphagia and polyuria.  Genitourinary: Negative for dysuria, flank pain, frequency and urgency.  Musculoskeletal: Positive for back pain and gait problem.  Skin: Negative for color change, pallor, rash and wound.  Neurological: Negative for light-headedness and headaches.       Dizziness  sometimes but none today  Hematological: Does not bruise/bleed easily.  Psychiatric/Behavioral: Negative for agitation, confusion and sleep disturbance.    Immunization History  Administered Date(s) Administered  . Influenza, High Dose Seasonal PF 04/26/2016, 03/22/2017  . Influenza-Unspecified 04/22/2011, 04/04/2012, 05/03/2013, 05/04/2013, 04/17/2014, 04/19/2015, 04/26/2016, 04/22/2017  . Pneumococcal Polysaccharide-23 08/26/2003, 09/06/2011  . Pneumococcal-Unspecified 12/20/2013  . Td 09/26/2006  . Zoster 10/25/2007  . Zoster Recombinat (Shingrix) 11/21/2017, 04/08/2018   Pertinent  Health Maintenance Due  Topic Date Due  . DEXA SCAN  07/30/1997  . PNA vac Low Risk Adult (2 of 2 - PCV13) 12/21/2014  . INFLUENZA VACCINE  02/23/2018   Fall Risk  05/09/2018 05/01/2018 04/10/2018 10/28/2017  Falls in the past year? Yes Yes No No  Comment - [facility] June/2019 [facility] - -  Number falls in past yr: 1 1 - -  Injury with Fall? No No - -  Risk for fall due to : - Impaired balance/gait - -  Follow up - Falls prevention discussed - -    Vitals:   05/09/18 1502  BP: (!) 158/76  Pulse: 68  Temp: (!) 97.4 F (36.3 C)  SpO2: 97%  Weight: 176 lb 3.2 oz (79.9 kg)  Height: '5\' 1"'  (1.549 m)   Body mass index is 33.29 kg/m. Physical Exam  Constitutional: She is oriented to person, place, and time.  Obese elderly in no acute distress  HENT:  Head: Normocephalic.  Right Ear: External ear normal.  Left Ear: External ear normal.  Mouth/Throat: Oropharynx is clear and moist. No oropharyngeal exudate.  Eyes: Pupils are equal, round, and reactive to light. Conjunctivae and EOM are normal. Right eye exhibits no discharge. Left eye exhibits no discharge. No scleral icterus.  Neck: Normal range of motion. No JVD present. No thyromegaly present.  Cardiovascular: Intact distal pulses.  Murmur heard. Pulmonary/Chest: Effort normal and breath sounds normal. No respiratory  distress. She has no wheezes. She has no rales.  Abdominal: Soft. Bowel sounds are normal. She exhibits no distension and no mass. There is no tenderness. There is no rebound and no guarding.  Musculoskeletal: She exhibits no tenderness.  Unsteady gait ambulates with walker.Bilateral lower extremities chronic edema zipper stockings hose in place.Kyphosis present.  Lymphadenopathy:    She has no cervical adenopathy.  Neurological: She is oriented to person, place, and time.  Skin: Skin is warm and dry. No rash noted. No erythema. No pallor.  Psychiatric: She  has a normal mood and affect. Her speech is normal and behavior is normal. Judgment and thought content normal.  Vitals reviewed.   Labs reviewed: Recent Labs    04/03/18 0810 05/03/18 1504 05/03/18 1505  NA 133* 135 137  K 4.3 4.5 4.6  CL 96* 95* 97  CO2 '27 24 23  ' GLUCOSE 94 106* 108*  BUN 22 36* 35*  CREATININE 0.97* 0.99 1.04*  CALCIUM 10.1 9.6 9.7   Recent Labs    10/25/17 1446  01/14/18 0928 01/23/18 02/06/18 0745 02/27/18 0000 04/03/18 0810  AST 30   < > 60* '24 21 25 28  ' ALT 22   < > '26 21 14 17 20  ' ALKPHOS 99  --  85 84  --   --   --   BILITOT 0.3   < > 0.8  --  0.4 0.4 0.3  PROT 7.4   < > 7.9 6.6 7.4 8.2* 7.2  ALBUMIN 3.9  --  4.3 3.9  --   --   --    < > = values in this interval not displayed.   Recent Labs    08/25/17 0845 10/25/17 1446  01/14/18 0928  01/16/18 0356 01/23/18 02/06/18 0745 02/27/18 0000  WBC 3.4* 5.2   < > 10.2   < > 5.1 4.3 4.5 4.7  NEUTROABS 1,992 3.1  --  7.9*  --   --   --   --   --   HGB 10.4* 10.7*   < > 12.3   < > 9.7* 10.1* 10.9* 11.9  HCT 31.6* 32.8*   < > 37.3   < > 29.8* 30* 32.0* 36.3  MCV 82.9 87.9   < > 88.0   < > 89.5  --  85.8 87.1  PLT 97* 84*   < > 98*   < > 75* 106* 77* 79*   < > = values in this interval not displayed.   Lab Results  Component Value Date   TSH 2.63 12/09/2017   No results found for: HGBA1C Lab Results  Component Value Date   CHOL 212  (H) 12/09/2017   HDL 72 12/09/2017   LDLCALC 121 (H) 12/09/2017   TRIG 88 12/09/2017   CHOLHDL 2.9 12/09/2017    Significant Diagnostic Results in last 30 days:  No results found.  Assessment/Plan 1. Hypertensive heart disease with chronic diastolic congestive heart failure (HCC) B/p stable.continue on hydralazine 25 mg tablet twice daily,losartan 100 mg tablet daily,amlodipine 10 mg tablet daily and torsemide 20 mg tablet twice daily. - CBC with Differential/Platelets future  - CMP with eGFR([facility]) future   2. Mixed hyperlipidemia Continue on Omega 3 1 GM daily.  - Lipid Panel future  3. Depression with anxiety Mood stable.continue on Wellbutrin SR 150 mg tablet twice daily and Alprazolam 0.25 mg tablet once at bedtime as needed.continue to monitor for mood changes. - TSH future   4. Acquired hypothyroidism Continue on levothyroxine 75 mcg tablet before breakfast.  - TSH future   5. Chronic bilateral low back pain without sciatica Back pain stable.continue on Tylenol 1000 mg tablet three times daily and Tramadol 25 mg tablet at bedtime.continue to follow up with pain management specialist as directed.   Family/ staff Communication: Reviewed plan of care with patient and daughter in law.  Labs/tests ordered: - CBC with Differential/Platelets future  - CMP with eGFR([facility]) future  - Lipid Panel future - TSH future   Follow up appointment : 3 months labs  prior to visit   Sandrea Hughs, NP

## 2018-05-17 ENCOUNTER — Other Ambulatory Visit: Payer: Self-pay | Admitting: *Deleted

## 2018-05-17 ENCOUNTER — Inpatient Hospital Stay (HOSPITAL_BASED_OUTPATIENT_CLINIC_OR_DEPARTMENT_OTHER): Payer: Medicare Other | Admitting: Oncology

## 2018-05-17 ENCOUNTER — Inpatient Hospital Stay: Payer: Medicare Other | Attending: Oncology

## 2018-05-17 VITALS — BP 152/53 | HR 85 | Temp 97.5°F | Resp 18 | Ht 61.0 in | Wt 176.4 lb

## 2018-05-17 DIAGNOSIS — Z79899 Other long term (current) drug therapy: Secondary | ICD-10-CM | POA: Diagnosis not present

## 2018-05-17 DIAGNOSIS — D696 Thrombocytopenia, unspecified: Secondary | ICD-10-CM | POA: Diagnosis not present

## 2018-05-17 DIAGNOSIS — M419 Scoliosis, unspecified: Secondary | ICD-10-CM | POA: Insufficient documentation

## 2018-05-17 DIAGNOSIS — D649 Anemia, unspecified: Secondary | ICD-10-CM

## 2018-05-17 DIAGNOSIS — E039 Hypothyroidism, unspecified: Secondary | ICD-10-CM

## 2018-05-17 LAB — CBC WITH DIFFERENTIAL (CANCER CENTER ONLY)
ABS IMMATURE GRANULOCYTES: 0.06 10*3/uL (ref 0.00–0.07)
BASOS ABS: 0 10*3/uL (ref 0.0–0.1)
Basophils Relative: 0 %
Eosinophils Absolute: 0.1 10*3/uL (ref 0.0–0.5)
Eosinophils Relative: 1 %
HEMATOCRIT: 33.1 % — AB (ref 36.0–46.0)
Hemoglobin: 10.5 g/dL — ABNORMAL LOW (ref 12.0–15.0)
IMMATURE GRANULOCYTES: 1 %
LYMPHS ABS: 1.2 10*3/uL (ref 0.7–4.0)
Lymphocytes Relative: 19 %
MCH: 28.8 pg (ref 26.0–34.0)
MCHC: 31.7 g/dL (ref 30.0–36.0)
MCV: 90.9 fL (ref 80.0–100.0)
MONOS PCT: 17 %
Monocytes Absolute: 1.1 10*3/uL — ABNORMAL HIGH (ref 0.1–1.0)
NEUTROS ABS: 4.1 10*3/uL (ref 1.7–7.7)
NEUTROS PCT: 62 %
Platelet Count: 106 10*3/uL — ABNORMAL LOW (ref 150–400)
RBC: 3.64 MIL/uL — ABNORMAL LOW (ref 3.87–5.11)
RDW: 17.2 % — ABNORMAL HIGH (ref 11.5–15.5)
WBC Count: 6.5 10*3/uL (ref 4.0–10.5)
nRBC: 0 % (ref 0.0–0.2)

## 2018-05-17 MED ORDER — LEVOTHYROXINE SODIUM 75 MCG PO TABS: 75.0000 ug | ORAL_TABLET | Freq: Every day | ORAL | 1 refills | Status: DC

## 2018-05-17 NOTE — Telephone Encounter (Signed)
[facility] 

## 2018-05-17 NOTE — Progress Notes (Signed)
Hematology and Oncology Follow Up Visit  [name] [name] [id number] [birth year] 84 y.o. 05/17/2018 3:49 PM Oneal Grout, MDPandey, Mahima, MD   Principle Diagnosis: 84 year old woman with thrombocytopenia noted in 2013.  She has fluctuating counts between 80,000 and normal range related to reactive process versus autoimmune etiology.   Current therapy: Active surveillance.  Interim History: [name] [name] returns today for a repeat evaluation.  Since last visit, she reports no major changes in her health.  She continues to have issue with scoliosis of the back and chronic back discomfort that limits her quality of life and performance status.  She ambulates with the help of a walker without any falls or syncope.  She does report some dyspnea on exertion at times and lower extremity edema but has been manageable.  She denies any active bleeding including epistaxis, hematochezia or melena.  She does not report any headaches, blurry vision, syncope or seizures.  She denies any alteration of mental status or confusion.  She denied any fevers, chills or sweats.  Does not report any cough, wheezing or hemoptysis.  Does not report any chest pain, palpitation, orthopnea or leg edema.  Does not report any nausea, vomiting or abdominal pain.  Does not report any change in bowel habits.  Does not report any paralysis or myalgias.   Does not report frequency, urgency or hematuria. Does not report any bleeding or clotting tendency.  No anxiety or depression.  Remaining review of systems is negative.    Medications: I have reviewed the patient's current medications.  Current Outpatient Medications  Medication Sig Dispense Refill  . acetaminophen (TYLENOL) 500 MG tablet Take 2 tablets (1,000 mg total) by mouth 3 (three) times daily. 60 tablet 3  . ALPRAZolam (XANAX) 0.25 MG tablet Take half  tablet by mouth once daily at bedtime as needed for rest 30 tablet 0  . amLODipine (NORVASC) 10 MG tablet Take 1 tablet (10 mg  total) by mouth daily.    Marland Kitchen buPROPion (WELLBUTRIN SR) 150 MG 12 hr tablet Take 1 tablet (150 mg total) by mouth 2 (two) times daily after a meal. 180 tablet 3  . calcium carbonate (OS-CAL) 600 MG TABS tablet Take 1 tablet (600 mg total) by mouth 2 (two) times daily with a meal. 180 tablet 3  . cholecalciferol (VITAMIN D) 1000 UNITS tablet Take 1,000 Units by mouth daily.    Marland Kitchen docusate sodium (COLACE) 100 MG capsule Take 100 mg by mouth 2 (two) times daily.    . fish oil-omega-3 fatty acids 1000 MG capsule Take 1 g by mouth daily.    . hydrALAZINE (APRESOLINE) 25 MG tablet Take 1 tablet (25 mg total) by mouth 2 (two) times daily. 180 tablet 3  . levothyroxine (SYNTHROID, LEVOTHROID) 75 MCG tablet Take 1 tablet (75 mcg total) by mouth daily before breakfast. 90 tablet 1  . loperamide (IMODIUM A-D) 2 MG tablet Take 2 mg by mouth as needed for diarrhea or loose stools.    Marland Kitchen losartan (COZAAR) 100 MG tablet TAKE 1 TABLET BY MOUTH  DAILY 90 tablet 1  . Multiple Vitamin (MULTIVITAMIN WITH MINERALS) TABS tablet Take 1 tablet by mouth daily.    . nitrofurantoin (MACRODANTIN) 50 MG capsule Take 1 capsule (50 mg total) by mouth daily. 90 capsule 3  . NON FORMULARY Take 12,600 mg by mouth daily as needed (Take 1 capsule). Triple Strength Cranberry    . omeprazole (PRILOSEC) 40 MG capsule Take 40 mg by mouth daily.    Marland Kitchen  potassium chloride SA (K-DUR,KLOR-CON) 20 MEQ tablet Take 1 tablet (20 mEq total) by mouth 2 (two) times daily. 60 tablet 2  . sennosides-docusate sodium (SENOKOT-S) 8.6-50 MG tablet Take 2 tablets by mouth at bedtime. (Patient not taking: Reported on 05/09/2018) 90 tablet 3  . torsemide (DEMADEX) 20 MG tablet Take 1 tablet (20 mg total) by mouth 2 (two) times daily. 60 tablet 0  . traMADol (ULTRAM) 50 MG tablet TAKE 1/2 TABLET BY MOUTH AT BEDTIME 15 tablet 1  . TYMLOS 3120 MCG/1.56ML SOPN INJECT SUBCUTANEOUSLY ONCE DAILY 3 pen 1  . zinc oxide 20 % ointment Apply 1 application topically as  needed for irritation.     No current facility-administered medications for this visit.      Allergies: No Known Allergies  Past Medical History, Surgical history, Social history, and Family History were reviewed and updated.    Physical Exam:  Blood pressure (!) 152/53, pulse 85, temperature (!) 97.5 F (36.4 C), temperature source Oral, resp. rate 18, height 5\' 1"  (1.549 m), weight 176 lb 6.4 oz (80 kg), SpO2 97 %.    ECOG: 1   General appearance: Comfortable appearing without any discomfort Head: Normocephalic without any trauma Oropharynx: Mucous membranes are moist and pink without any thrush or ulcers. Eyes: Pupils are equal and round reactive to light. Lymph nodes: No cervical, supraclavicular, inguinal or axillary lymphadenopathy.   Heart:regular rate and rhythm.  S1 and S2 without leg edema. Lung: Clear without any rhonchi or wheezes.  No dullness to percussion. Abdomin: Soft, nontender, nondistended with good bowel sounds.  No hepatosplenomegaly. Musculoskeletal: No joint deformity or effusion.  Full range of motion noted. Neurological: No deficits noted on motor, sensory and deep tendon reflex exam. Skin: No ecchymosis or petechiae.     Lab Results: Lab Results  Component Value Date   WBC 4.7 02/27/2018   HGB 11.9 02/27/2018   HCT 36.3 02/27/2018   MCV 87.1 02/27/2018   PLT 79 (L) 02/27/2018     Chemistry      Component Value Date/Time   NA 137 05/03/2018 1505   K 4.6 05/03/2018 1505   CL 97 05/03/2018 1505   CL 97 01/23/2018   CO2 23 05/03/2018 1505   CO2 30 01/23/2018   BUN 35 (H) 05/03/2018 1505   CREATININE 1.04 (H) 05/03/2018 1505   CREATININE 0.97 (H) 04/03/2018 0810   GLU 88 01/23/2018      Component Value Date/Time   CALCIUM 9.7 05/03/2018 1505   CALCIUM 9.6 01/23/2018   ALKPHOS 84 01/23/2018   AST 28 04/03/2018 0810   ALT 20 04/03/2018 0810   BILITOT 0.3 04/03/2018 0810         Impression and Plan:   84 year old woman  with:   1.  Thrombocytopenia with fluctuating counts dating back to 2013.  Her laboratory data in the interval showed a platelet count of 100,000 or above on multiple occasions but has dropped periodically close to the 70 range.    The differential diagnosis of these findings were reviewed today.  The etiology is related to reactive process rather than a underlying hematological condition.  Given the fluctuation nature of her counts, autoimmune etiologies or splenic sequestration could also be a possibility.  Imaging study of the abdomen and pelvis in June 2019 did not show any evidence of malignancy or splenomegaly.  Her laboratory data were reviewed from today and showed a platelet count above 100,000 without any active bleeding.  Given these findings, I  have recommended continued active surveillance as intervention is not needed at this time.   2.  Mild anemia: Her hemoglobin is close to normal range with work-up was unrevealing.  She did have a mild monoclonal protein at 0.2 g/dL and does not account for her anemia and does not represent a plasma cell disorder.   3.  Follow-up: We will be as needed in the future.  15  minutes was spent with the patient face-to-face today.  More than 50% of time was dedicated to reviewing her laboratory data, [place] diagnosis and management options.     Eli Hose, MD 10/23/20193:49 PM

## 2018-05-18 ENCOUNTER — Telehealth: Payer: Self-pay

## 2018-05-18 NOTE — Telephone Encounter (Signed)
Per 10/23 no los °

## 2018-05-22 ENCOUNTER — Ambulatory Visit: Payer: Medicare Other | Admitting: Physical Medicine & Rehabilitation

## 2018-05-30 ENCOUNTER — Other Ambulatory Visit: Payer: Self-pay | Admitting: *Deleted

## 2018-05-30 DIAGNOSIS — E039 Hypothyroidism, unspecified: Secondary | ICD-10-CM

## 2018-05-30 MED ORDER — LEVOTHYROXINE SODIUM 75 MCG PO TABS: 75.0000 ug | ORAL_TABLET | Freq: Every day | ORAL | 1 refills | Status: DC

## 2018-05-30 NOTE — Telephone Encounter (Signed)
[facility] 

## 2018-05-31 ENCOUNTER — Other Ambulatory Visit: Payer: Self-pay | Admitting: Nurse Practitioner

## 2018-06-02 ENCOUNTER — Encounter: Payer: Medicare Other | Attending: Physical Medicine & Rehabilitation

## 2018-06-02 ENCOUNTER — Ambulatory Visit: Payer: Medicare Other | Admitting: Physical Medicine & Rehabilitation

## 2018-06-02 DIAGNOSIS — I10 Essential (primary) hypertension: Secondary | ICD-10-CM | POA: Insufficient documentation

## 2018-06-02 DIAGNOSIS — Z96651 Presence of right artificial knee joint: Secondary | ICD-10-CM | POA: Diagnosis not present

## 2018-06-02 DIAGNOSIS — M545 Low back pain: Secondary | ICD-10-CM | POA: Diagnosis present

## 2018-06-02 DIAGNOSIS — Z87891 Personal history of nicotine dependence: Secondary | ICD-10-CM | POA: Diagnosis not present

## 2018-06-02 DIAGNOSIS — Z87440 Personal history of urinary (tract) infections: Secondary | ICD-10-CM | POA: Insufficient documentation

## 2018-06-02 DIAGNOSIS — Z87310 Personal history of (healed) osteoporosis fracture: Secondary | ICD-10-CM | POA: Insufficient documentation

## 2018-06-02 DIAGNOSIS — Z833 Family history of diabetes mellitus: Secondary | ICD-10-CM | POA: Diagnosis not present

## 2018-06-02 DIAGNOSIS — M810 Age-related osteoporosis without current pathological fracture: Secondary | ICD-10-CM | POA: Diagnosis not present

## 2018-06-02 DIAGNOSIS — F419 Anxiety disorder, unspecified: Secondary | ICD-10-CM | POA: Insufficient documentation

## 2018-06-02 DIAGNOSIS — Z801 Family history of malignant neoplasm of trachea, bronchus and lung: Secondary | ICD-10-CM | POA: Diagnosis not present

## 2018-06-02 DIAGNOSIS — G8929 Other chronic pain: Secondary | ICD-10-CM | POA: Diagnosis not present

## 2018-06-02 DIAGNOSIS — E785 Hyperlipidemia, unspecified: Secondary | ICD-10-CM | POA: Insufficient documentation

## 2018-06-02 DIAGNOSIS — Z8261 Family history of arthritis: Secondary | ICD-10-CM | POA: Diagnosis not present

## 2018-06-02 DIAGNOSIS — Z79899 Other long term (current) drug therapy: Secondary | ICD-10-CM | POA: Diagnosis not present

## 2018-06-02 DIAGNOSIS — M47816 Spondylosis without myelopathy or radiculopathy, lumbar region: Secondary | ICD-10-CM | POA: Diagnosis not present

## 2018-06-02 DIAGNOSIS — M419 Scoliosis, unspecified: Secondary | ICD-10-CM | POA: Insufficient documentation

## 2018-06-02 DIAGNOSIS — K219 Gastro-esophageal reflux disease without esophagitis: Secondary | ICD-10-CM | POA: Insufficient documentation

## 2018-06-02 DIAGNOSIS — E039 Hypothyroidism, unspecified: Secondary | ICD-10-CM | POA: Insufficient documentation

## 2018-06-02 DIAGNOSIS — Z823 Family history of stroke: Secondary | ICD-10-CM | POA: Diagnosis not present

## 2018-06-02 NOTE — Patient Instructions (Signed)

## 2018-06-02 NOTE — Progress Notes (Signed)
  PROCEDURE RECORD [facility]   Name: EDIE VALLANDINGHAM DOB:[birth year] MRN: [id number]  Date:06/02/2018  Physician: Claudette Laws, MD    Nurse/CMA: Leroy Kennedy  Allergies: No Known Allergies  Consent Signed: Yes.    Is patient diabetic? No.  CBG today?  Pregnant: No. LMP: No LMP recorded. Patient is postmenopausal. (age 84-55)  Anticoagulants: no Anti-inflammatory: no Antibiotics: yes (Macrodantin-to prevent UTI)  Procedure: Bilateral Medial Branch Block Position: Prone Start Time: 2:54pm End Time: 3:07pm   Fluoro Time: 51  RN/CMA Nedra Hai, CMA Sharne Linders, CMA    Time 2:35pm     BP 151\73 147/73    Pulse 77 88    Respirations 16 16    O2 Sat 95 96    S/S 6 6    Pain Level 3/10 3/10     D/C home with caretaker, patient A & O X 3, D/C instructions reviewed, and sits independently.

## 2018-06-02 NOTE — Progress Notes (Signed)

## 2018-06-19 ENCOUNTER — Other Ambulatory Visit: Payer: Self-pay | Admitting: *Deleted

## 2018-06-19 DIAGNOSIS — M545 Low back pain, unspecified: Secondary | ICD-10-CM

## 2018-06-19 DIAGNOSIS — G8929 Other chronic pain: Secondary | ICD-10-CM

## 2018-06-19 MED ORDER — TRAMADOL HCL 50 MG PO TABS: 25.0000 mg | ORAL_TABLET | Freq: Every day | ORAL | 0 refills | Status: DC

## 2018-06-19 NOTE — Telephone Encounter (Signed)
Patient requested.  [place]CC[place] Verified.  LR: 05/17/2018 Rx phoned to pharmacy.

## 2018-07-03 ENCOUNTER — Ambulatory Visit: Payer: Medicare Other | Admitting: Physical Medicine & Rehabilitation

## 2018-07-17 ENCOUNTER — Telehealth: Payer: Self-pay

## 2018-07-17 NOTE — Telephone Encounter (Signed)
Returning Bruce's call.

## 2018-07-17 NOTE — Telephone Encounter (Signed)
Patients son called requesting information on the injections that mrs [name] [name] recieved recently and wanted to know if there is an easier way to assist the patient onto the table for the injections.  Called him back, no answer, left message to return call.

## 2018-07-24 ENCOUNTER — Other Ambulatory Visit: Payer: Self-pay | Admitting: *Deleted

## 2018-07-24 ENCOUNTER — Other Ambulatory Visit: Payer: Self-pay | Admitting: Nurse Practitioner

## 2018-07-24 DIAGNOSIS — M545 Low back pain, unspecified: Secondary | ICD-10-CM

## 2018-07-24 DIAGNOSIS — F418 Other specified anxiety disorders: Secondary | ICD-10-CM

## 2018-07-24 DIAGNOSIS — G8929 Other chronic pain: Secondary | ICD-10-CM

## 2018-07-24 NOTE — Telephone Encounter (Signed)
Please send it again for approval.

## 2018-07-25 MED ORDER — TRAMADOL HCL 50 MG PO TABS: 25.0000 mg | ORAL_TABLET | Freq: Every day | ORAL | 0 refills | Status: DC

## 2018-07-25 NOTE — Telephone Encounter (Signed)
Send it in the approval form to be signed.

## 2018-07-27 ENCOUNTER — Other Ambulatory Visit: Payer: Self-pay | Admitting: *Deleted

## 2018-07-27 DIAGNOSIS — G8929 Other chronic pain: Secondary | ICD-10-CM

## 2018-07-27 DIAGNOSIS — M545 Low back pain, unspecified: Secondary | ICD-10-CM

## 2018-07-27 DIAGNOSIS — F418 Other specified anxiety disorders: Secondary | ICD-10-CM

## 2018-07-27 MED ORDER — ALPRAZOLAM 0.25 MG PO TABS: ORAL_TABLET | ORAL | 0 refills | Status: DC

## 2018-07-27 MED ORDER — TRAMADOL HCL 50 MG PO TABS: 25.0000 mg | ORAL_TABLET | Freq: Every day | ORAL | 0 refills | Status: DC

## 2018-07-27 NOTE — Telephone Encounter (Signed)
Patient requested refills.  [place]CCSRS Database Verified Xanax LR: 06/02/2018 Tramadol LR: 06/19/2018 Pended Rx and sent to [name] for approval

## 2018-08-03 ENCOUNTER — Other Ambulatory Visit: Payer: Self-pay | Admitting: Internal Medicine

## 2018-08-03 ENCOUNTER — Other Ambulatory Visit: Payer: Self-pay | Admitting: *Deleted

## 2018-08-03 MED ORDER — POTASSIUM CHLORIDE CRYS ER 20 MEQ PO TBCR: 20.0000 meq | EXTENDED_RELEASE_TABLET | Freq: Two times a day (BID) | ORAL | 1 refills | Status: DC

## 2018-08-03 NOTE — Telephone Encounter (Signed)
[facility] 

## 2018-08-04 NOTE — Telephone Encounter (Signed)
[name], Mrs Schifano's daughter in law called back and they have talked her into trying the injections again but they need to know if there is an easier way for her to have them since it is so hard for her to lay on her stomach.  Please advise.

## 2018-08-04 NOTE — Telephone Encounter (Signed)
Cannot do injections any other way really

## 2018-08-07 ENCOUNTER — Other Ambulatory Visit: Payer: Medicare Other

## 2018-08-08 LAB — CBC WITH DIFFERENTIAL/PLATELET
Absolute Monocytes: 512 cells/uL (ref 200–950)
BASOS ABS: 31 {cells}/uL (ref 0–200)
Basophils Relative: 1 %
EOS PCT: 1.3 %
Eosinophils Absolute: 40 cells/uL (ref 15–500)
HCT: 32.4 % — ABNORMAL LOW (ref 35.0–45.0)
Hemoglobin: 10.5 g/dL — ABNORMAL LOW (ref 11.7–15.5)
Lymphs Abs: 1023 cells/uL (ref 850–3900)
MCH: 28.2 pg (ref 27.0–33.0)
MCHC: 32.4 g/dL (ref 32.0–36.0)
MCV: 86.9 fL (ref 80.0–100.0)
MONOS PCT: 16.5 %
NEUTROS ABS: 1494 {cells}/uL — AB (ref 1500–7800)
NEUTROS PCT: 48.2 %
PLATELETS: 79 10*3/uL — AB (ref 140–400)
RBC: 3.73 10*6/uL — ABNORMAL LOW (ref 3.80–5.10)
RDW: 16.2 % — ABNORMAL HIGH (ref 11.0–15.0)
Total Lymphocyte: 33 %
WBC: 3.1 10*3/uL — AB (ref 3.8–10.8)

## 2018-08-08 LAB — LIPID PANEL
Cholesterol: 247 mg/dL — ABNORMAL HIGH (ref <200)
HDL: 69 mg/dL (ref >50)
LDL CHOLESTEROL (CALC): 155 mg/dL — AB
Non-HDL Cholesterol (Calc): 178 mg/dL (calc) — ABNORMAL HIGH (ref <130)
TRIGLYCERIDES: 109 mg/dL (ref <150)
Total CHOL/HDL Ratio: 3.6 (calc) (ref <5.0)

## 2018-08-08 LAB — COMPLETE METABOLIC PANEL WITH GFR
AG Ratio: 1.4 (calc) (ref 1.0–2.5)
ALKALINE PHOSPHATASE (APISO): 102 U/L (ref 33–130)
ALT: 22 U/L (ref 6–29)
AST: 30 U/L (ref 10–35)
Albumin: 4.4 g/dL (ref 3.6–5.1)
BILIRUBIN TOTAL: 0.3 mg/dL (ref 0.2–1.2)
BUN / CREAT RATIO: 24 (calc) — AB (ref 6–22)
BUN: 25 mg/dL (ref 7–25)
CHLORIDE: 100 mmol/L (ref 98–110)
CO2: 26 mmol/L (ref 20–32)
Calcium: 10.5 mg/dL — ABNORMAL HIGH (ref 8.6–10.4)
Creat: 1.04 mg/dL — ABNORMAL HIGH (ref 0.60–0.88)
GFR, Est African American: 56 mL/min/{1.73_m2} — ABNORMAL LOW (ref >=60)
GFR, Est Non African American: 49 mL/min/{1.73_m2} — ABNORMAL LOW (ref >=60)
GLUCOSE: 90 mg/dL (ref 65–99)
Globulin: 3.2 g/dL (calc) (ref 1.9–3.7)
Potassium: 4.2 mmol/L (ref 3.5–5.3)
Sodium: 139 mmol/L (ref 135–146)
Total Protein: 7.6 g/dL (ref 6.1–8.1)

## 2018-08-08 LAB — TSH: TSH: 4.34 m[IU]/L (ref 0.40–4.50)

## 2018-08-08 NOTE — Telephone Encounter (Signed)
Notified. She will try taking one of her alprazolam before the procedure.  They are going to call and schedule an injection.

## 2018-08-15 ENCOUNTER — Encounter: Payer: Self-pay | Admitting: Family

## 2018-08-16 ENCOUNTER — Encounter: Payer: Self-pay | Admitting: Internal Medicine

## 2018-08-21 ENCOUNTER — Other Ambulatory Visit: Payer: Self-pay | Admitting: Gastroenterology

## 2018-08-21 DIAGNOSIS — F320 Major depressive disorder, single episode, mild: Secondary | ICD-10-CM

## 2018-08-23 ENCOUNTER — Non-Acute Institutional Stay: Payer: Medicare Other | Admitting: Internal Medicine

## 2018-08-23 ENCOUNTER — Encounter: Payer: Self-pay | Admitting: Internal Medicine

## 2018-08-23 ENCOUNTER — Other Ambulatory Visit: Payer: Self-pay | Admitting: Internal Medicine

## 2018-08-23 VITALS — BP 122/68 | HR 69 | Ht 61.0 in | Wt 179.6 lb

## 2018-08-23 DIAGNOSIS — M545 Low back pain, unspecified: Secondary | ICD-10-CM

## 2018-08-23 DIAGNOSIS — D61818 Other pancytopenia: Secondary | ICD-10-CM | POA: Diagnosis not present

## 2018-08-23 DIAGNOSIS — M8000XG Age-related osteoporosis with current pathological fracture, unspecified site, subsequent encounter for fracture with delayed healing: Secondary | ICD-10-CM

## 2018-08-23 DIAGNOSIS — I10 Essential (primary) hypertension: Secondary | ICD-10-CM

## 2018-08-23 DIAGNOSIS — G8929 Other chronic pain: Secondary | ICD-10-CM

## 2018-08-23 DIAGNOSIS — F320 Major depressive disorder, single episode, mild: Secondary | ICD-10-CM

## 2018-08-24 ENCOUNTER — Other Ambulatory Visit: Payer: Self-pay | Admitting: *Deleted

## 2018-08-24 DIAGNOSIS — G8929 Other chronic pain: Secondary | ICD-10-CM

## 2018-08-24 DIAGNOSIS — M545 Low back pain, unspecified: Secondary | ICD-10-CM

## 2018-08-24 MED ORDER — TRAMADOL HCL 50 MG PO TABS: 25.0000 mg | ORAL_TABLET | Freq: Every day | ORAL | 0 refills | Status: DC

## 2018-08-24 NOTE — Telephone Encounter (Signed)
[facility] Fax Refill Request NCCSRS Database Verified LR: 07/27/2018 Pended Rx and sent to Dr. [name] for approval.

## 2018-08-26 ENCOUNTER — Other Ambulatory Visit: Payer: Self-pay | Admitting: Internal Medicine

## 2018-08-26 DIAGNOSIS — G8929 Other chronic pain: Secondary | ICD-10-CM

## 2018-08-26 DIAGNOSIS — M545 Low back pain, unspecified: Secondary | ICD-10-CM

## 2018-08-27 MED ORDER — NITROFURANTOIN MACROCRYSTAL 50 MG PO CAPS: 50.0000 mg | ORAL_CAPSULE | Freq: Every day | ORAL | 3 refills | Status: DC

## 2018-08-27 NOTE — Progress Notes (Signed)
Location:  [place] of Service:  Clinic (12)  Provider:   Code Status:  Goals of Care:  Advanced Directives 08/23/2018  Does Patient Have a Medical Advance Directive? Yes  Type of Advance Directive Out of facility DNR (pink MOST or yellow form)  Does patient want to make changes to medical advance directive? No - Patient declined  Copy of [place] in Chart? -  Pre-existing out of facility DNR order (yellow form or pink MOST form) Yellow form placed in chart (order not valid for inpatient use)     Chief Complaint  Patient presents with  . Medical Management of Chronic Issues    3 month follow up,discuss labs    HPI: Patient is a 85 y.o. female seen today for medical management of chronic diseases.   Patient has h/o Chronic Back Pain, Hypertension, Hypothyroidism, Hypertension, Recurrent Cystitis, Osteoporosis and Major Depression.  Patient came with her Daughter in law. She lives in [place] but has caregiver who helps her with Showers and breakfast. She is mostly Wheelchair and walker dependent.  Patient had number of complains today but her main complain was Pain in her Back. It seems she was over dosing herself with Tramadol so now she is on 25 mg only at night. But in the morning she gets up and her pain is worse and she cannot go through the Day without pain. She is on Tylenol also.  She is also very depressed. Was crying during her interview. And all this seems to be related to her pain and dependence on other people. Because of pain is not walking or doing any kind of Exercise. And her weight has gone up also. Patient also wanted to know if she need to be restarted on something for her osteoporosis. She was on Tymlos and her last dose was in Nov.   Past Medical History:  Diagnosis Date  . Anemia   . Anxiety   . Chronotropic incompetence 12/2012    potentially medication related; noted on CPET   . DDD (degenerative disc  disease)   . Eczema   . GERD (gastroesophageal reflux disease)   . H/O hiatal hernia   . Hx: UTI (urinary tract infection)   . Hyperlipidemia   . Hypertension   . Hypothyroidism   . Osteopenia   . Septicemia ([place]) 2002   following UTI  . Vertigo, benign positional     Past Surgical History:  Procedure Laterality Date  . BREAST SURGERY     left biopsy  . CATARACT EXTRACTION Right    2 weeks ago  . CPET / MET - PFTS     Consistent with chronotropic incompetence; See attached report in the results section  . DILATION AND CURETTAGE OF UTERUS     x 2  . DOPPLER ECHOCARDIOGRAPHY  01/10/2013   Normal LV size and function. Normal EF. Air sclerosis but no stenosis  . ESOPHAGOGASTRODUODENOSCOPY  05/04/2012   Procedure: ESOPHAGOGASTRODUODENOSCOPY (EGD);  Surgeon: Inda Castle, MD;  Location: [facility];  Service: Endoscopy;  Laterality: N/A;  . EYE SURGERY     cataract extraction with ILO  ? eye  . IR KYPHO EA ADDL LEVEL THORACIC OR LUMBAR  10/28/2016  . IR KYPHO LUMBAR INC FX REDUCE BONE BX UNI/BIL CANNULATION INC/IMAGING  10/28/2016  . IR KYPHO THORACIC WITH BONE BIOPSY  12/24/2016  . IR RADIOLOGIST EVAL & MGMT  11/11/2016  . KNEE ARTHROSCOPY  2011   right  .  TONSILLECTOMY    . TOTAL KNEE ARTHROPLASTY  04/24/2012   Procedure: TOTAL KNEE ARTHROPLASTY;  Surgeon: Gearlean Alf, MD;  Location: [facility];  Service: Orthopedics;  Laterality: Right;  . TRANSTHORACIC ECHOCARDIOGRAM  02/2018   Ordered by PCP for "congestive heart failure " -EF 60 M 65%.  GR 1 DD.  No R WMA--- > ESSENTIALLY NORMAL    No Known Allergies  Outpatient Encounter Medications as of 08/23/2018  Medication Sig  . acetaminophen (TYLENOL) 500 MG tablet Take 2 tablets (1,000 mg total) by mouth 3 (three) times daily.  Marland Kitchen ALPRAZolam (XANAX) 0.25 MG tablet Take one tablet by mouth at bedtime as needed for rest  . amLODipine (NORVASC) 10 MG tablet Take 1 tablet (10 mg total) by mouth daily.  Marland Kitchen buPROPion (WELLBUTRIN SR) 150  MG 12 hr tablet Take 1 tablet (150 mg total) by mouth 2 (two) times daily after a meal.  . calcium carbonate (OS-CAL) 600 MG TABS tablet Take 1 tablet (600 mg total) by mouth 2 (two) times daily with a meal.  . cholecalciferol (VITAMIN D) 1000 UNITS tablet Take 1,000 Units by mouth daily.  Marland Kitchen docusate sodium (COLACE) 100 MG capsule Take 100 mg by mouth 2 (two) times daily.  . fish oil-omega-3 fatty acids 1000 MG capsule Take 1 g by mouth daily.  . hydrALAZINE (APRESOLINE) 25 MG tablet Take 1 tablet (25 mg total) by mouth 2 (two) times daily. (Patient taking differently: Take 25 mg by mouth daily. )  . levothyroxine (SYNTHROID, LEVOTHROID) 75 MCG tablet Take 1 tablet (75 mcg total) by mouth daily before breakfast.  . loperamide (IMODIUM A-D) 2 MG tablet Take 2 mg by mouth as needed for diarrhea or loose stools.  Marland Kitchen losartan (COZAAR) 100 MG tablet TAKE 1 TABLET BY MOUTH  DAILY  . Multiple Vitamin (MULTIVITAMIN WITH MINERALS) TABS tablet Take 1 tablet by mouth daily.  . NON FORMULARY Take 12,600 mg by mouth daily as needed (Take 1 capsule). Triple Strength Cranberry  . omeprazole (PRILOSEC) 40 MG capsule Take 40 mg by mouth daily.  . potassium chloride SA (K-DUR,KLOR-CON) 20 MEQ tablet Take 1 tablet (20 mEq total) by mouth 2 (two) times daily.  . sennosides-docusate sodium (SENOKOT-S) 8.6-50 MG tablet Take 2 tablets by mouth at bedtime.  . torsemide (DEMADEX) 20 MG tablet TAKE 1 TABLET BY MOUTH TWICE A DAY  . zinc oxide 20 % ointment Apply 1 application topically as needed for irritation.  . [DISCONTINUED] nitrofurantoin (MACRODANTIN) 50 MG capsule Take 1 capsule (50 mg total) by mouth daily.  . [DISCONTINUED] traMADol (ULTRAM) 50 MG tablet Take 0.5 tablets (25 mg total) by mouth at bedtime.  . TYMLOS 3120 MCG/1.56ML SOPN INJECT 80MCG SUBCUTANEOUSLY ONCE DAILY (Patient not taking: Reported on 08/23/2018)  . [DISCONTINUED] calcium carbonate (OS-CAL) 600 MG TABS tablet Take 600 mg by mouth daily.   No  facility-administered encounter medications on file as of 08/23/2018.     Review of Systems:  Review of Systems  Constitutional: Positive for activity change.  HENT: Negative.   Respiratory: Positive for cough and shortness of breath.   Cardiovascular: Positive for leg swelling.  Gastrointestinal: Negative.   Genitourinary: Negative.   Musculoskeletal: Positive for arthralgias and back pain.  Skin: Negative.   Neurological: Positive for weakness.  Psychiatric/Behavioral: Positive for dysphoric mood and sleep disturbance. The patient is nervous/anxious.     Health Maintenance  Topic Date Due  . DEXA SCAN  07/30/1997  . PNA vac Low Risk Adult (  2 of 2 - PCV13) 12/21/2014  . TETANUS/TDAP  09/25/2016  . INFLUENZA VACCINE  Completed    Physical Exam: Vitals:   08/23/18 1330  BP: 122/68  Pulse: 69  SpO2: 95%  Weight: 179 lb 9.6 oz (81.5 kg)  Height: '5\' 1"'  (1.549 m)   Body mass index is 33.94 kg/m. Physical Exam Constitutional:      Appearance: Normal appearance.  HENT:     Head: Normocephalic.     Nose: Nose normal.     Mouth/Throat:     Mouth: Mucous membranes are moist.     Pharynx: Oropharynx is clear.  Neck:     Musculoskeletal: Neck supple.  Cardiovascular:     Rate and Rhythm: Normal rate and regular rhythm.     Pulses: Normal pulses.     Heart sounds: Normal heart sounds.  Pulmonary:     Effort: Pulmonary effort is normal. No respiratory distress.     Breath sounds: Normal breath sounds. No wheezing or rales.  Musculoskeletal:     Comments: Mild Edema Bilateral  Skin:    General: Skin is warm and dry.  Neurological:     General: No focal deficit present.     Mental Status: She is alert and oriented to person, place, and time.  Psychiatric:     Comments: She was depressed      Labs reviewed: Basic Metabolic Panel: Recent Labs    12/09/17 0000  05/03/18 1504 05/03/18 1505 08/07/18 0745  NA 135   < > 135 137 139  K 4.7   < > 4.5 4.6 4.2  CL 98    < > 95* 97 100  CO2 28   < > '24 23 26  ' GLUCOSE 99   < > 106* 108* 90  BUN 22   < > 36* 35* 25  CREATININE 0.86   < > 0.99 1.04* 1.04*  CALCIUM 10.3   < > 9.6 9.7 10.5*  TSH 2.63  --   --   --  4.34   < > = values in this interval not displayed.   Liver Function Tests: Recent Labs    10/25/17 1446  01/14/18 0928 01/23/18  02/27/18 0000 04/03/18 0810 08/07/18 0745  AST 30   < > 60* 24   < > '25 28 30  ' ALT 22   < > 26 21   < > '17 20 22  ' ALKPHOS 99  --  85 84  --   --   --   --   BILITOT 0.3   < > 0.8  --    < > 0.4 0.3 0.3  PROT 7.4   < > 7.9 6.6   < > 8.2* 7.2 7.6  ALBUMIN 3.9  --  4.3 3.9  --   --   --   --    < > = values in this interval not displayed.   No results for input(s): LIPASE, AMYLASE in the last 8760 hours. No results for input(s): AMMONIA in the last 8760 hours. CBC: Recent Labs    01/14/18 0928  02/27/18 0000 05/17/18 1537 08/07/18 0745  WBC 10.2   < > 4.7 6.5 3.1*  NEUTROABS 7.9*  --   --  4.1 1,494*  HGB 12.3   < > 11.9 10.5* 10.5*  HCT 37.3   < > 36.3 33.1* 32.4*  MCV 88.0   < > 87.1 90.9 86.9  PLT 98*   < > 79* 106* 79*   < > =  values in this interval not displayed.   Lipid Panel: Recent Labs    12/09/17 0000 08/07/18 0745  CHOL 212* 247*  HDL 72 69  LDLCALC 121* 155*  TRIG 88 109  CHOLHDL 2.9 3.6   No results found for: HGBA1C  Procedures since last visit: No results found.  Assessment/Plan 1. Chronic bilateral low back pain without sciatica Patient had Bilateral Lumbar Medial Branch Block done on 11/19. But patient says she does not want to go through that.  We discussed about going up on ultram and some worry about sedation. So at this time will increase her Ultram to 25 mg QAm and HS. Revaluate in 8 weeks   2. Current  major depressive disorder Patient is clearly Depressed  She is on  Wellbutrin 150 mg BID But we will hold on changing the dose right now due to worry about Mental status Change . So will do this in Next  visit. Also Consider Psych Consult.  -Pancytopenia [facility]) Patient follows with Hematology Last visit was in 10/19 But they said they were not suppose to follow with them unless change in labs. Since her White Count was low today. We will repeat the CBC in 8 weeks and if it is low will refer her back   Age-related osteoporosis  No Dexa scan report  Was on Tymlos per patient  Will try to get records from Her previous PCP Repeat Dexa and possible Prolia  Essential hypertension BP stable on Cozaar, Hydralazine LE edema Doing good on Demadex Hypothyroid TSH was normal Hyperlipidemia Patient wants to dietary Modification for Now but we discussed how her LDL is slowly going up. Will discuss in next visit  Recurrent Cystitis  nitrofurantoin (MACRODANTIN) 50 MG capsule; Take 1 capsule (50 mg total) by mouth daily.  Dispense: 90 capsule; Refill: 3   Labs/tests ordered: Repeat CBC  Next appt:  In 8 weeks   Total time spent in this patient care encounter was 60_ minutes; greater than 50% of the visit spent counseling patient, reviewing records , Labs and coordinating care for problems addressed at this encounter.

## 2018-08-28 ENCOUNTER — Other Ambulatory Visit: Payer: Self-pay | Admitting: *Deleted

## 2018-08-28 DIAGNOSIS — F418 Other specified anxiety disorders: Secondary | ICD-10-CM

## 2018-08-28 MED ORDER — ALPRAZOLAM 0.25 MG PO TABS: ORAL_TABLET | ORAL | 0 refills | Status: DC

## 2018-08-28 NOTE — Telephone Encounter (Signed)
#  15 filled on 08/24/2018 Database verified

## 2018-08-28 NOTE — Telephone Encounter (Signed)
Caregiver called and stated that the patient saw you last week and you increased her Tramadol. They stated that they went to the pharmacy to pick up but it wasn't there.   Reviewed chart and Rx was sent to [facility]. They are out of it and wanted it sent to local pharmacy. [facility]  [place] Database Verified and Rx was sent to [facility] and filled 08/25/2018. Daughter is calling [facility]  Also requested a refill on Alprazolam.  [place] Database Verified and LR: 07/27/2018. Phoned Rx to [facility].

## 2018-08-30 ENCOUNTER — Other Ambulatory Visit: Payer: Self-pay | Admitting: *Deleted

## 2018-08-30 DIAGNOSIS — I5032 Chronic diastolic (congestive) heart failure: Principal | ICD-10-CM

## 2018-08-30 DIAGNOSIS — I110 Hypertensive heart disease with heart failure: Secondary | ICD-10-CM

## 2018-08-30 MED ORDER — AMLODIPINE BESYLATE 10 MG PO TABS: 10.0000 mg | ORAL_TABLET | Freq: Every day | ORAL | 1 refills | Status: DC

## 2018-08-30 NOTE — Telephone Encounter (Signed)
Natalie requested refill for patient. Faxed to [facility]

## 2018-09-03 ENCOUNTER — Other Ambulatory Visit: Payer: Self-pay | Admitting: Family

## 2018-09-04 ENCOUNTER — Other Ambulatory Visit: Payer: Self-pay | Admitting: *Deleted

## 2018-09-04 DIAGNOSIS — M545 Low back pain, unspecified: Secondary | ICD-10-CM

## 2018-09-04 DIAGNOSIS — G8929 Other chronic pain: Secondary | ICD-10-CM

## 2018-09-04 MED ORDER — TRAMADOL HCL 50 MG PO TABS: ORAL_TABLET | ORAL | 0 refills | Status: DC

## 2018-09-04 NOTE — Telephone Encounter (Signed)
[name] [name], Caregiver requested refill request. Stated the Tramadol was increased at last OV visit. Wants RF sent to [facility]  No Changes were made in Current medication list for the increase. Reviewed last OV note and Dr. [name] stated:   Assessment/Plan 1. Chronic bilateral low back pain without sciatica Patient had Bilateral Lumbar Medial Branch Block done on 11/19. But patient says she does not want to go through that.  We discussed about going up on ultram and some worry about sedation. So at this time will increase her Ultram to 25 mg QAm and HS. Revaluate in 8 weeks   Medication list updated [place]CC[place] Database Verified LR: 08/25/18 #15 Pended Rx and sent to Dr. [name] for approval.

## 2018-09-21 ENCOUNTER — Other Ambulatory Visit: Payer: Medicare Other

## 2018-09-21 DIAGNOSIS — D61818 Other pancytopenia: Secondary | ICD-10-CM

## 2018-09-21 LAB — CBC WITH DIFFERENTIAL/PLATELET
Absolute Monocytes: 624 cells/uL (ref 200–950)
Basophils Absolute: 20 cells/uL (ref 0–200)
Basophils Relative: 0.7 %
EOS PCT: 0.7 %
Eosinophils Absolute: 20 cells/uL (ref 15–500)
HCT: 30.6 % — ABNORMAL LOW (ref 35.0–45.0)
Hemoglobin: 10.1 g/dL — ABNORMAL LOW (ref 11.7–15.5)
Lymphs Abs: 795 cells/uL — ABNORMAL LOW (ref 850–3900)
MCH: 28.4 pg (ref 27.0–33.0)
MCHC: 33 g/dL (ref 32.0–36.0)
MCV: 86 fL (ref 80.0–100.0)
Monocytes Relative: 21.5 %
Neutro Abs: 1441 cells/uL — ABNORMAL LOW (ref 1500–7800)
Neutrophils Relative %: 49.7 %
PLATELETS: 83 10*3/uL — AB (ref 140–400)
RBC: 3.56 10*6/uL — ABNORMAL LOW (ref 3.80–5.10)
RDW: 16.6 % — ABNORMAL HIGH (ref 11.0–15.0)
Total Lymphocyte: 27.4 %
WBC: 2.9 10*3/uL — ABNORMAL LOW (ref 3.8–10.8)

## 2018-09-27 ENCOUNTER — Telehealth: Payer: Self-pay | Admitting: *Deleted

## 2018-09-27 NOTE — Telephone Encounter (Signed)
Patient and caregiver called regarding lab work results, she would like for the results to be called to her daughter- in- [place] [phone].

## 2018-10-16 ENCOUNTER — Telehealth: Payer: Self-pay | Admitting: *Deleted

## 2018-10-16 NOTE — Telephone Encounter (Signed)
Daughter in [place], [place] called and stated that patient has an appointment with Dr. [name] this Wednesday. They are wanting the Telephone visit instead of coming to clinic. Also wants the labwork discussed at that time. Changed appt type to Virtual OV on Schedule.  Please call back to confirm this on your clinic schedule #[phone]

## 2018-10-17 NOTE — Telephone Encounter (Signed)
Call placed to [name] [name] on 3/23 and confirmed the the Virtual appointment she is also aware that the appointment will be with [name] on 3/25 and will call back to reschedule with [name] if that is a problem,

## 2018-10-18 ENCOUNTER — Non-Acute Institutional Stay (INDEPENDENT_AMBULATORY_CARE_PROVIDER_SITE_OTHER): Payer: Medicare Other | Admitting: Family

## 2018-10-18 ENCOUNTER — Encounter: Payer: Self-pay | Admitting: Family

## 2018-10-18 DIAGNOSIS — E039 Hypothyroidism, unspecified: Secondary | ICD-10-CM | POA: Diagnosis not present

## 2018-10-18 DIAGNOSIS — F418 Other specified anxiety disorders: Secondary | ICD-10-CM

## 2018-10-18 DIAGNOSIS — K2100 Gastro-esophageal reflux disease with esophagitis, without bleeding: Secondary | ICD-10-CM

## 2018-10-18 DIAGNOSIS — M545 Low back pain, unspecified: Secondary | ICD-10-CM

## 2018-10-18 DIAGNOSIS — M549 Dorsalgia, unspecified: Secondary | ICD-10-CM

## 2018-10-18 DIAGNOSIS — I119 Hypertensive heart disease without heart failure: Secondary | ICD-10-CM

## 2018-10-18 DIAGNOSIS — E782 Mixed hyperlipidemia: Secondary | ICD-10-CM

## 2018-10-18 DIAGNOSIS — G4700 Insomnia, unspecified: Secondary | ICD-10-CM

## 2018-10-18 DIAGNOSIS — G8929 Other chronic pain: Secondary | ICD-10-CM

## 2018-10-18 MED ORDER — ACETAMINOPHEN 500 MG PO TABS: 1000.0000 mg | ORAL_TABLET | Freq: Two times a day (BID) | ORAL | 3 refills | Status: DC

## 2018-10-18 MED ORDER — MELATONIN 3 MG PO TABS: 3.0000 mg | ORAL_TABLET | Freq: Every day | ORAL | 3 refills | Status: AC

## 2018-10-18 NOTE — Progress Notes (Addendum)
Virtual Visit via Telephone Note  I connected with [name] [name] on 10/18/18 at  1:30 PM EDT by telephone and verified that I am speaking with the correct person using two identifiers.   I discussed the limitations, risks, security and privacy concerns of performing an evaluation and management service by telephone and the availability of in person appointments. I also discussed with the patient that there may be a patient responsible charge related to this service. The patient expressed understanding and agreed to proceed.  This service is provided via telemedicine Patient has an 8 week follow up appointment  No vital signs collected/recorded due to the encounter was a telemedicine visit.   Location of patient (ex: home, work):  [place] apartment  Patient consents to a telephone visit: YES  Location of the provider (ex: office, home): [facility]   Name of any referring provider: Advice worker  Names of all persons participating in the telemedicine service and their role in the encounter:  [name](daughter in law) [name] [name]  Time spent on call:  45mn  History of Present Illness: Patient visit via telephone with daughter present who provided additional information for HPI.patient denies any acute issues during visit.current medication and labs reviewed.  Hypertension - currently taking Hydralazine 25 mg tablet only at bedtime instead of twice daily states B/p dropped low in the morning and cardiologist recommended to take once daily.Also taking amlodipine 10 mg tablet daily and Losartan 100 mg tablet daily.she does have chronic dizziness but no worsening symptoms.denies faintness, chest pain or shortness of breath.  Hypothyroidism - takes levothyroxine 75 mcg tablet daily.TSH level 4.34  Reviewed.   Anxiety - stable on Alprazolam 0.25 mg tablet at bedtime.states reports crying has improved.   Insomnia - Has tried tylenol PM but not effective.Used melatonin in the  past willing to try again.   Depression - Has improved states not crying or feeling depressed.Daughter states patient cried previously when going to see the specialist but none since then.currently taking Wellbutrin SR 150 mg tablet twice daily  Leg edema - swelling has improved.Keeps legs elevated since she is not walking all the time because of COVID-19.she takes torsemide 20 mg tablet twice daily along with potassium 20 meq tablet twice daily.   Low back pain - pain under control with Tylenol 500 mg tablet take 2 tablets twice daily and 500 mg tablet at bedtime.  GERD - no acid reflux as long as she is on omeprazole daily.   Hyperlipidemia - previous labs reviewed with patient and daughter LDL and total cholesterol still not at goal.currently on fish oil 1 Gm daily.low carbo,low saturated fats and high vegetable diet recommended.     Observations/Objective: Reports Afebrile.  Patient cooperative in a good mood.   Assessment and Plan: 1. Chronic bilateral low back pain without sciatica Pain under control.continue on extra strength Tylenol.  - acetaminophen (TYLENOL) 500 MG tablet; Take 2 tablets (1,000 mg total) by mouth 2 (two) times daily. 500 mg tablet at bedtime  Dispense: 60 tablet; Refill: 3  2. Acquired hypothyroidism Recent TSH level 4.34 (07/2018).continue levothyroxine 75 mcg tablet daily before breakfast.  - TSH; Future  4. Gastroesophageal reflux disease with esophagitis Asymptomatic.continue on omeprazole 40 mg capsule daily.    5. Hypertensive heart disease without heart failure B/p stable.continue on Hydralazine 25 mg tablet at bedtime, amlodipine 10 mg tablet daily and Losartan 100 mg tablet daily. - CBC with Differential/Platelet; Future - CMP with eGFR([facility]); Future  6. Depression with anxiety Mood stable.continue on Wellbutrin SR 150 mg tablet twice daily and Alprazolam 0.25 mg tablet at bedtime.continue to monitor for mood changes.   7. Insomnia, unspecified  type Start on melatonin 3 mg tablet at bedtime may repeat x 1 dose if still unable to sleep.  8. Mixed hyperlipidemia Previous LDL 155 reviewed with patient and daughter.continue on Fish oil 1 gm daily.will increase dose if LDL  Still high with next labs.recomended low carbohydrate,low saturated fats and high vegetable diet.   - Lipid panel; Future   Follow Up Instructions:  Labs : CBC/diff,CMP,TSH level and lipid panel in 1 month   Follow up visit: 3 months for medical management of chronic medical issues and as needed.   I discussed the assessment and treatment plan with the patient. The patient was provided an opportunity to ask questions and all were answered. The patient agreed with the plan and demonstrated an understanding of the instructions.   The patient was advised to call back or seek an in-person evaluation if the symptoms worsen or if the condition fails to improve as anticipated.  I provided 22:40 minutes of non-face-to-face time during this encounter.

## 2018-10-26 ENCOUNTER — Other Ambulatory Visit: Payer: Self-pay | Admitting: Internal Medicine

## 2018-10-26 DIAGNOSIS — F418 Other specified anxiety disorders: Secondary | ICD-10-CM

## 2018-10-26 NOTE — Telephone Encounter (Signed)
Patient is requesting refill.  Last filled on 08/28/2018 for 30 tabs total. Last appointment with [name] 10/18/2018 and next appt is with Dr. [name] 01/24/2019

## 2018-10-27 NOTE — Addendum Note (Signed)
Addended byRicharda Blade C on: 10/27/2018 03:23 PM   Modules accepted: Level of Service

## 2018-11-02 ENCOUNTER — Other Ambulatory Visit: Payer: Self-pay | Admitting: *Deleted

## 2018-11-02 ENCOUNTER — Other Ambulatory Visit: Payer: Self-pay | Admitting: Internal Medicine

## 2018-11-02 DIAGNOSIS — G8929 Other chronic pain: Secondary | ICD-10-CM

## 2018-11-02 DIAGNOSIS — M545 Low back pain, unspecified: Secondary | ICD-10-CM

## 2018-11-02 DIAGNOSIS — I5032 Chronic diastolic (congestive) heart failure: Principal | ICD-10-CM

## 2018-11-02 DIAGNOSIS — I110 Hypertensive heart disease with heart failure: Secondary | ICD-10-CM

## 2018-11-02 MED ORDER — LOSARTAN POTASSIUM 100 MG PO TABS: 100.0000 mg | ORAL_TABLET | Freq: Every day | ORAL | 1 refills | Status: DC

## 2018-11-02 NOTE — Telephone Encounter (Signed)
Last refill 09/04/2018 I don't have access to [place] Database

## 2018-11-02 NOTE — Telephone Encounter (Signed)
[facility] 

## 2018-11-07 ENCOUNTER — Other Ambulatory Visit: Payer: Self-pay | Admitting: *Deleted

## 2018-11-07 MED ORDER — TORSEMIDE 20 MG PO TABS: 20.0000 mg | ORAL_TABLET | Freq: Two times a day (BID) | ORAL | 1 refills | Status: DC

## 2018-11-17 ENCOUNTER — Other Ambulatory Visit: Payer: Self-pay | Admitting: Internal Medicine

## 2018-11-19 ENCOUNTER — Encounter: Payer: Self-pay | Admitting: Internal Medicine

## 2018-11-20 ENCOUNTER — Telehealth: Payer: Self-pay | Admitting: *Deleted

## 2018-11-20 NOTE — Telephone Encounter (Signed)
Patient's daughter called because her mother was worried about whether she needed to leave her apartment to go have blood  drawn on Thursday. I informed her that the facility is only allowing certain people to come in the building and no visitors to visit residents. When the lab personnel comes into the facility they don a lab coat, gloves face shield and mask when drawing blood. The daughter stated that she will let her mother know.

## 2018-11-23 ENCOUNTER — Other Ambulatory Visit: Payer: Self-pay

## 2018-11-23 ENCOUNTER — Other Ambulatory Visit: Payer: Medicare Other

## 2018-11-23 DIAGNOSIS — E039 Hypothyroidism, unspecified: Secondary | ICD-10-CM

## 2018-11-23 DIAGNOSIS — I119 Hypertensive heart disease without heart failure: Secondary | ICD-10-CM

## 2018-11-23 DIAGNOSIS — E782 Mixed hyperlipidemia: Secondary | ICD-10-CM

## 2018-11-24 LAB — CBC WITH DIFFERENTIAL/PLATELET
Absolute Monocytes: 731 cells/uL (ref 200–950)
Basophils Absolute: 22 cells/uL (ref 0–200)
Basophils Relative: 0.6 %
Eosinophils Absolute: 29 cells/uL (ref 15–500)
Eosinophils Relative: 0.8 %
HCT: 32.8 % — ABNORMAL LOW (ref 35.0–45.0)
Hemoglobin: 10.7 g/dL — ABNORMAL LOW (ref 11.7–15.5)
Lymphs Abs: 770 cells/uL — ABNORMAL LOW (ref 850–3900)
MCH: 27.9 pg (ref 27.0–33.0)
MCHC: 32.6 g/dL (ref 32.0–36.0)
MCV: 85.4 fL (ref 80.0–100.0)
Monocytes Relative: 20.3 %
Neutro Abs: 2048 cells/uL (ref 1500–7800)
Neutrophils Relative %: 56.9 %
Platelets: 88 10*3/uL — ABNORMAL LOW (ref 140–400)
RBC: 3.84 10*6/uL (ref 3.80–5.10)
RDW: 17.1 % — ABNORMAL HIGH (ref 11.0–15.0)
Total Lymphocyte: 21.4 %
WBC: 3.6 10*3/uL — ABNORMAL LOW (ref 3.8–10.8)

## 2018-11-24 LAB — LIPID PANEL
Cholesterol: 227 mg/dL — ABNORMAL HIGH (ref <200)
HDL: 60 mg/dL (ref >=50)
LDL Cholesterol (Calc): 141 mg/dL (calc) — ABNORMAL HIGH
Non-HDL Cholesterol (Calc): 167 mg/dL (calc) — ABNORMAL HIGH (ref <130)
Total CHOL/HDL Ratio: 3.8 (calc) (ref <5.0)
Triglycerides: 140 mg/dL (ref <150)

## 2018-11-24 LAB — COMPLETE METABOLIC PANEL WITH GFR
AG Ratio: 1.5 (calc) (ref 1.0–2.5)
ALT: 22 U/L (ref 6–29)
AST: 29 U/L (ref 10–35)
Albumin: 4.5 g/dL (ref 3.6–5.1)
Alkaline phosphatase (APISO): 105 U/L (ref 37–153)
BUN/Creatinine Ratio: 21 (calc) (ref 6–22)
BUN: 27 mg/dL — ABNORMAL HIGH (ref 7–25)
CO2: 31 mmol/L (ref 20–32)
Calcium: 10.2 mg/dL (ref 8.6–10.4)
Chloride: 96 mmol/L — ABNORMAL LOW (ref 98–110)
Creat: 1.3 mg/dL — ABNORMAL HIGH (ref 0.60–0.88)
GFR, Est African American: 43 mL/min/{1.73_m2} — ABNORMAL LOW (ref >=60)
GFR, Est Non African American: 37 mL/min/{1.73_m2} — ABNORMAL LOW (ref >=60)
Globulin: 3.1 g/dL (calc) (ref 1.9–3.7)
Glucose, Bld: 88 mg/dL (ref 65–99)
Potassium: 4.1 mmol/L (ref 3.5–5.3)
Sodium: 134 mmol/L — ABNORMAL LOW (ref 135–146)
Total Bilirubin: 0.3 mg/dL (ref 0.2–1.2)
Total Protein: 7.6 g/dL (ref 6.1–8.1)

## 2018-11-24 LAB — TSH: TSH: 2.9 mIU/L (ref 0.40–4.50)

## 2018-11-28 ENCOUNTER — Telehealth: Payer: Self-pay | Admitting: *Deleted

## 2018-11-28 NOTE — Telephone Encounter (Signed)
Robin Current with [facility] called and stated that patient's family is wanting to move patient to a higher level of care.  Zella Ball is requesting a FL2 form to be filled out and the last 2 OV notes to be attached. Needs ASAP.

## 2018-11-29 NOTE — Telephone Encounter (Signed)
I took office notes for [name] and [name]  to [name] [name] while I was in clinic today.[name] [name] stated Dr [name] still needed to fill out  FL2.I have given the form to Dr [name] to fill out and return to [facility]

## 2018-12-01 ENCOUNTER — Other Ambulatory Visit: Payer: Self-pay | Admitting: Family

## 2018-12-06 ENCOUNTER — Other Ambulatory Visit: Payer: Self-pay | Admitting: Internal Medicine

## 2018-12-07 ENCOUNTER — Other Ambulatory Visit: Payer: Self-pay | Admitting: Internal Medicine

## 2018-12-08 ENCOUNTER — Telehealth: Payer: Self-pay | Admitting: *Deleted

## 2018-12-08 NOTE — Telephone Encounter (Signed)
Robin Kerr, nurse with [facility] called and stated that she spoke with you yesterday regarding an order that was to be written for patient to have COVID19 Testing due to Transitioning to [place]. Stated that [facility] lab is coming on Monday.  

## 2018-12-20 ENCOUNTER — Other Ambulatory Visit: Payer: Self-pay | Admitting: Internal Medicine

## 2018-12-20 DIAGNOSIS — G8929 Other chronic pain: Secondary | ICD-10-CM

## 2018-12-20 DIAGNOSIS — M545 Low back pain, unspecified: Secondary | ICD-10-CM

## 2018-12-20 DIAGNOSIS — F418 Other specified anxiety disorders: Secondary | ICD-10-CM

## 2018-12-20 MED ORDER — TRAMADOL HCL 50 MG PO TABS: 25.0000 mg | ORAL_TABLET | Freq: Every evening | ORAL | 0 refills | Status: DC | PRN

## 2018-12-20 MED ORDER — ALPRAZOLAM 0.25 MG PO TABS: ORAL_TABLET | ORAL | 0 refills | Status: DC

## 2018-12-27 ENCOUNTER — Non-Acute Institutional Stay: Payer: Medicare Other | Admitting: Internal Medicine

## 2018-12-27 ENCOUNTER — Encounter: Payer: Self-pay | Admitting: Internal Medicine

## 2018-12-27 DIAGNOSIS — E039 Hypothyroidism, unspecified: Secondary | ICD-10-CM

## 2018-12-27 DIAGNOSIS — D61818 Other pancytopenia: Secondary | ICD-10-CM

## 2018-12-27 DIAGNOSIS — I10 Essential (primary) hypertension: Secondary | ICD-10-CM

## 2018-12-27 DIAGNOSIS — F321 Major depressive disorder, single episode, moderate: Secondary | ICD-10-CM

## 2018-12-27 MED ORDER — TRAMADOL HCL 50 MG PO TABS: 50.0000 mg | ORAL_TABLET | Freq: Two times a day (BID) | ORAL | 0 refills | Status: AC

## 2018-12-27 NOTE — Progress Notes (Signed)
Provider: Veleta Miners L,MD  Location:  [facility] Room Number: 39/A Place of Service:  ALF (13)  PCP: Virgie Dad, MD Patient Care Team: Virgie Dad, MD as PCP - General (Internal Medicine) Mast, Man X, NP as Nurse Practitioner (Internal Medicine)  Extended Emergency Contact Information Primary Emergency Contact: Yerkes,[name] Address: [address]          [place], [place] [address] [place] Phone: [phone] Mobile Phone: [phone] Relation: Son Secondary Emergency Contact: Jalomo,[name]  [place] Phone: [phone] Relation: None  Code Status:DNR Goals of Care: Advanced Directive information Advanced Directives 12/27/2018  Does Patient Have a Medical Advance Directive? Yes  Type of Advance Directive Out of facility DNR (pink MOST or yellow form)  Does patient want to make changes to medical advance directive? No - Patient declined  Copy of [place] in Chart? -  Pre-existing out of facility DNR order (yellow form or pink MOST form) -      Chief Complaint  Patient presents with   New admit to [facility]    Transferring from Independent living    Health Maintenance    tetanus vaccine, PCV 13 vaccine     HPI: Patient is a 85 y.o. female seen today for admission to AL  Patient has h/o Chronic Back Pain, Hypertension, Hypothyroidism, Hypertension, Recurrent Cystitis, Osteoporosis and Major Depression Patient recently has moved [place] to [place] as she was unable to take care of herself  Patient has h/o Chronic Back Pain, Hypertension, Hypothyroidism, Hypertension, Recurrent Cystitis, Osteoporosis and Major Depression.  Patient has history of chronic back pain.  She is mostly wheelchair dependent and walks with a walker but very short distance her main complaint today was back pain which is not controlled with tramadol She also has a history of depression with Anxiety and is worse since she has  moved to [place] Otherwise she did not have any acute complaints just mild shortness of breath.  Denies any cough, Chest Pain, or  Fever Patient is adjusting to [place].  She is able to walk some with a walker.  And has therapy working with her now    Past Medical History:  Diagnosis Date   Anemia    Anxiety    Chronotropic incompetence 12/2012    potentially medication related; noted on CPET    DDD (degenerative disc disease)    Eczema    GERD (gastroesophageal reflux disease)    H/O hiatal hernia    Hx: UTI (urinary tract infection)    Hyperlipidemia    Hypertension    Hypothyroidism    Osteopenia    Septicemia ([place]) 2002   following UTI   Vertigo, benign positional    Past Surgical History:  Procedure Laterality Date   BREAST SURGERY     left biopsy   CATARACT EXTRACTION Right    2 weeks ago   CPET / MET - PFTS     Consistent with chronotropic incompetence; See attached report in the results section   DILATION AND CURETTAGE OF UTERUS     x 2   DOPPLER ECHOCARDIOGRAPHY  01/10/2013   Normal LV size and function. Normal EF. Air sclerosis but no stenosis   ESOPHAGOGASTRODUODENOSCOPY  05/04/2012   Procedure: ESOPHAGOGASTRODUODENOSCOPY (EGD);  Surgeon: Inda Castle, MD;  Location: [facility];  Service: Endoscopy;  Laterality: N/A;   EYE SURGERY     cataract extraction with ILO  ? eye  IR KYPHO EA ADDL LEVEL THORACIC OR LUMBAR  10/28/2016   IR KYPHO LUMBAR INC FX REDUCE BONE BX UNI/BIL CANNULATION INC/IMAGING  10/28/2016   IR KYPHO THORACIC WITH BONE BIOPSY  12/24/2016   IR RADIOLOGIST EVAL & MGMT  11/11/2016   KNEE ARTHROSCOPY  2011   right   TONSILLECTOMY     TOTAL KNEE ARTHROPLASTY  04/24/2012   Procedure: TOTAL KNEE ARTHROPLASTY;  Surgeon: Gearlean Alf, MD;  Location: [facility];  Service: Orthopedics;  Laterality: Right;   TRANSTHORACIC ECHOCARDIOGRAM  02/2018   Ordered by PCP for "congestive heart failure " -EF 60 M 65%.  GR 1 DD.  No R WMA--- >  ESSENTIALLY NORMAL    reports that she quit smoking about 53 years ago. Her smoking use included cigarettes. She quit after 0.00 years of use. She has never used smokeless tobacco. She reports that she does not drink alcohol or use drugs. Social History   Socioeconomic History   Marital status: Widowed    Spouse name: [name]   Number of children: 3   Years of education: Not on file   Highest education level: Not on file  Occupational History   Occupation: homemaker  Social Designer, fashion/clothing strain: Not hard at all   Food insecurity:    Worry: Never true    Inability: Never true   Transportation needs:    Medical: No    Non-medical: No  Tobacco Use   Smoking status: Former Smoker    Years: 0.00    Types: Cigarettes    Last attempt to quit: 05/03/1965    Years since quitting: 53.6   Smokeless tobacco: Never Used  Substance and Sexual Activity   Alcohol use: No    Comment: occ glass wine   Drug use: No   Sexual activity: Not on file  Lifestyle   Physical activity:    Days per week: 7 days    Minutes per session: 10 min   Stress: To some extent  Relationships   Social connections:    Talks on phone: More than three times a week    Gets together: More than three times a week    Attends religious service: Never    Active member of club or organization: No    Attends meetings of clubs or organizations: Never    Relationship status: Widowed   Intimate partner violence:    Fear of current or ex partner: No    Emotionally abused: No    Physically abused: No    Forced sexual activity: No  Other Topics Concern   Not on file  Social History Narrative   She is a widowed mother of 3.   She is a former smoker, with a distant history having quit in 1966.   She does drink an occasional glass of red wine.   She is just now started to try doing exercise with water aerobics.    Functional Status Survey:    Family History  Problem Relation Age of  Onset   Lung cancer Father 91       Died at age 72   Stroke Mother 105       Died in her late 61s.   Heart attack Mother 30   Diabetes Mother    Arthritis Mother     Health Maintenance  Topic Date Due   PNA vac Low Risk Adult (2 of 2 - PCV13) 12/21/2014   TETANUS/TDAP  09/25/2016   INFLUENZA VACCINE  02/24/2019   DEXA SCAN  Completed    No Known Allergies  Outpatient Encounter Medications as of 12/27/2018  Medication Sig   acetaminophen (TYLENOL) 500 MG tablet Take 2 tablets (1,000 mg total) by mouth 2 (two) times daily. 500 mg tablet at bedtime   ALPRAZolam (XANAX) 0.25 MG tablet TAKE 1 TABLET BY MOUTH AT BEDTIME AS NEEDED FOR REST   amLODipine (NORVASC) 10 MG tablet Take 1 tablet (10 mg total) by mouth daily.   buPROPion (WELLBUTRIN SR) 150 MG 12 hr tablet Take 1 tablet (150 mg total) by mouth 2 (two) times daily after a meal.   cholecalciferol (VITAMIN D) 1000 UNITS tablet Take 1,000 Units by mouth 2 (two) times a day.    docusate sodium (COLACE) 100 MG capsule Take 100 mg by mouth 2 (two) times daily.   Fiber POWD Take 15 mLs by mouth daily. Mix with 4-8 oz of water   fish oil-omega-3 fatty acids 1000 MG capsule Take 1 g by mouth daily.   hydrALAZINE (APRESOLINE) 25 MG tablet Take 1 tablet (25 mg total) by mouth 2 (two) times daily.   KLOR-CON M20 20 MEQ tablet TAKE 1 TABLET BY MOUTH TWICE A DAY   levothyroxine (SYNTHROID, LEVOTHROID) 75 MCG tablet Take 1 tablet (75 mcg total) by mouth daily before breakfast.   losartan (COZAAR) 100 MG tablet Take 1 tablet (100 mg total) by mouth daily.   nitrofurantoin (MACRODANTIN) 50 MG capsule Take 1 capsule (50 mg total) by mouth daily.   nystatin (MYCOSTATIN/NYSTOP) powder Apply 100,000 g topically 2 (two) times daily.   omeprazole (PRILOSEC) 40 MG capsule Take 40 mg by mouth daily.   torsemide (DEMADEX) 20 MG tablet TAKE 1 TABLET BY MOUTH TWICE A DAY   traMADol (ULTRAM) 50 MG tablet Take 0.5 tablets (25 mg  total) by mouth at bedtime as needed for up to 30 days. TAKE ONE-HALF TABLET (25 MG TOTAL) BY MOUTH AT BEDTIME (Patient taking differently: Take 25 mg by mouth daily. TAKE ONE-HALF TABLET (25 MG TOTAL) BY MOUTH AT BEDTIME)   [DISCONTINUED] calcium carbonate (OS-CAL) 600 MG TABS tablet Take 1 tablet (600 mg total) by mouth 2 (two) times daily with a meal.   [DISCONTINUED] loperamide (IMODIUM A-D) 2 MG tablet Take 2 mg by mouth as needed for diarrhea or loose stools.   [DISCONTINUED] Multiple Vitamin (MULTIVITAMIN WITH MINERALS) TABS tablet Take 1 tablet by mouth daily.   [DISCONTINUED] NON FORMULARY Take 12,600 mg by mouth daily as needed (Take 1 capsule). Triple Strength Cranberry   No facility-administered encounter medications on file as of 12/27/2018.     Review of Systems  Constitutional: Negative.   HENT: Negative.   Respiratory: Positive for shortness of breath.   Cardiovascular: Positive for leg swelling.  Gastrointestinal: Negative.   Genitourinary: Negative.   Musculoskeletal: Positive for back pain, gait problem, myalgias and neck pain.  Skin: Negative.   Neurological: Positive for weakness.  Psychiatric/Behavioral: Positive for dysphoric mood and sleep disturbance. The patient is nervous/anxious.     Vitals:   12/27/18 0936  BP: (!) 160/72  Pulse: 66  Resp: 16  Temp: (!) 96.5 F (35.8 C)  SpO2: 97%  Weight: 175 lb 6.4 oz (79.6 kg)  Height: '5\' 4"'  (1.626 m)   Body mass index is 30.11 kg/m. Physical Exam  Constitutional: Oriented to person, place, and time. Well-developed and well-nourished.  HENT:  Head: Normocephalic.  Mouth/Throat: Oropharynx is clear and moist.  Eyes: Pupils are equal, round, and reactive  to light.  Neck: Neck supple.  Cardiovascular: Normal rate and normal heart sounds.  No murmur heard. Pulmonary/Chest: Effort normal and breath sounds normal. No respiratory distress. No wheezes. She has no rales.  Abdominal: Soft. Bowel sounds are normal.  No distension. There is no tenderness. There is no rebound.  Musculoskeletal: Mild  edema.  Lymphadenopathy: none Neurological: Alert and oriented to person, place, and time.  Skin: Skin is warm and dry.  Psychiatric: Normal mood and affect. Behavior is normal. Thought content normal.    Labs reviewed: Basic Metabolic Panel: Recent Labs    05/03/18 1505 08/07/18 0745 11/23/18 0000  NA 137 139 134*  K 4.6 4.2 4.1  CL 97 100 96*  CO2 '23 26 31  ' GLUCOSE 108* 90 88  BUN 35* 25 27*  CREATININE 1.04* 1.04* 1.30*  CALCIUM 9.7 10.5* 10.2   Liver Function Tests: Recent Labs    01/14/18 0928 01/23/18  04/03/18 0810 08/07/18 0745 11/23/18 0000  AST 60* 24   < > '28 30 29  ' ALT 26 21   < > '20 22 22  ' ALKPHOS 85 84  --   --   --   --   BILITOT 0.8  --    < > 0.3 0.3 0.3  PROT 7.9 6.6   < > 7.2 7.6 7.6  ALBUMIN 4.3 3.9  --   --   --   --    < > = values in this interval not displayed.   No results for input(s): LIPASE, AMYLASE in the last 8760 hours. No results for input(s): AMMONIA in the last 8760 hours. CBC: Recent Labs    08/07/18 0745 09/21/18 0815 11/23/18 0000  WBC 3.1* 2.9* 3.6*  NEUTROABS 1,494* 1,441* 2,048  HGB 10.5* 10.1* 10.7*  HCT 32.4* 30.6* 32.8*  MCV 86.9 86.0 85.4  PLT 79* 83* 88*   Cardiac Enzymes: Recent Labs    01/14/18 0928 01/16/18 0356  CKTOTAL 1,707* 621*   BNP: Invalid input(s): POCBNP No results found for: HGBA1C Lab Results  Component Value Date   TSH 2.90 11/23/2018   Lab Results  Component Value Date   VITAMINB12 1,333 (H) 10/25/2017   Lab Results  Component Value Date   FOLATE 23.0 10/25/2017   Lab Results  Component Value Date   IRON 94 10/25/2017   TIBC 366 10/25/2017   FERRITIN 102 10/25/2017    Imaging and Procedures obtained prior to SNF admission: Dg Pelvis 1-2 Views  Result Date: 01/14/2018 CLINICAL DATA:  Fall last night onto right side. EXAM: PELVIS - 1-2 VIEW COMPARISON:  None. FINDINGS: There is no evidence  of pelvic fracture or diastasis. No pelvic bone lesions are seen. Mild degenerate change of the spine and hips. IMPRESSION: No acute fracture. Electronically Signed   By: Marin Olp M.D.   On: 01/14/2018 10:19   Dg Tibia/fibula Right  Result Date: 01/14/2018 CLINICAL DATA:  Fall last night onto right side with right lower leg pain. EXAM: RIGHT TIBIA AND FIBULA - 2 VIEW COMPARISON:  None. FINDINGS: Right total knee arthroplasty intact. No acute fracture or dislocation. IMPRESSION: No acute findings. Electronically Signed   By: Marin Olp M.D.   On: 01/14/2018 10:22   Ct Head Wo Contrast  Result Date: 01/14/2018 CLINICAL DATA:  Fall last night landing on right side. EXAM: CT HEAD WITHOUT CONTRAST CT CERVICAL SPINE WITHOUT CONTRAST TECHNIQUE: Multidetector CT imaging of the head and cervical spine was performed following the standard protocol without intravenous  contrast. Multiplanar CT image reconstructions of the cervical spine were also generated. COMPARISON:  None. FINDINGS: CT HEAD FINDINGS Brain: Ventricles, cisterns and other CSF spaces are within normal as there is mild age related atrophic change. Mild to moderate chronic ischemic microvascular disease is present. There is no mass, mass effect, shift of midline structures or acute hemorrhage. No evidence of acute infarction. Vascular: No hyperdense vessel or unexpected calcification. Skull: Normal. Negative for fracture or focal lesion. Sinuses/Orbits: No acute finding. Other: None. CT CERVICAL SPINE FINDINGS Alignment: 2 mm anterior subluxation of C7 on T1 which is degenerative due to moderate facet arthropathy. Skull base and vertebrae: Moderate spondylosis of the cervical spine. Vertebral body heights are maintained. Atlantoaxial articulation is within normal. There is moderate uncovertebral joint spurring and facet arthropathy. There is neural foraminal narrowing at multiple levels due to adjacent bony spurring. There is no acute fracture.  Soft tissues and spinal canal: No prevertebral fluid or swelling. No visible canal hematoma. Disc levels: Moderate disc space narrowing at the C5-6 and C6-7 levels. Upper chest: Negative. Other: None. IMPRESSION: No acute brain injury. Mild to moderate chronic ischemic microvascular disease and mild age related atrophic change. No acute cervical spine injury. Moderate spondylosis of the cervical spine with moderate disc disease at the C5-6 and C6-7 levels. Moderate bilateral neural foraminal narrowing at multiple levels due to adjacent bony spurring. Electronically Signed   By: Marin Olp M.D.   On: 01/14/2018 10:13   Ct Chest W Contrast  Result Date: 01/14/2018 CLINICAL DATA:  85 year old fell from bed.  Abdominal trauma. EXAM: CT CHEST, ABDOMEN, AND PELVIS WITH CONTRAST TECHNIQUE: Multidetector CT imaging of the chest, abdomen and pelvis was performed following the standard protocol during bolus administration of intravenous contrast. CONTRAST:  157m ISOVUE-300 IOPAMIDOL (ISOVUE-300) INJECTION 61% COMPARISON:  12/21/2016 FINDINGS: CT CHEST FINDINGS Cardiovascular: Atherosclerotic calcifications in the coronary arteries and throughout the thoracic aorta. No evidence to suggest aortic aneurysm or dissection. Main pulmonary arteries are patent. Heart size is normal without significant pericardial fluid. Mediastinum/Nodes: Small hiatal hernia. No lymph node enlargement in the chest. Lungs/Pleura: Trachea and mainstem bronchi are patent. Negative for pneumothorax. Scarring at the lung apices, right side greater than left. Scarring and/or atelectasis at both lung bases. No large pleural effusions. 5 mm pleural-based nodule at the right lung base along the right major fissure on sequence 4, image 112. This was present on the study from 12/21/2016 and likely an incidental finding. Peripheral reticular densities in the right upper lung are suggestive for chronic changes, possibly mild fibrosis in these areas. No  significant airspace disease or consolidation. Musculoskeletal: Both shoulders are located. Clavicles are intact. Old compression fracture at T9 with vertebral body cement. Old compression fracture at T11 with previous vertebral augmentation. No evidence for a displaced rib fracture. CT ABDOMEN PELVIS FINDINGS Hepatobiliary: Gallbladder is mildly distended without inflammatory changes. Normal appearance of the liver. No biliary dilatation. The main portal venous system is patent. Pancreas: Pancreas is poorly characterized. However, there appear to be a tubular or cystic structure along the top of the pancreatic body or adjacent to the pancreas body on sequence 2, image 57. This structure measures greater than 2.5 cm. In retrospect, this was present on 12/21/2016 and may have been present on the noncontrast study from 05/26/2010. No acute inflammatory changes involving the pancreas. Spleen: Normal in size without focal abnormality. Adrenals/Urinary Tract: Adrenal glands are unremarkable. Normal appearance of both kidneys without hydronephrosis. No suspicious renal lesions. Urinary bladder  is markedly distended extending into the lower abdomen. Stomach/Bowel: Small hiatal hernia. No evidence for bowel obstruction or focal bowel inflammation. Vascular/Lymphatic: Aorta and visceral arteries are heavily calcified without aneurysm. No lymph node enlargement in the abdomen or pelvis. Reproductive: Uterus and adnexal structures are unremarkable. Other: There is soft tissue in the presacral space on sequence 2, image 100. This area measures roughly 2.9 x 1.8 x 3.2 cm. Similar finding was present on the CT from 10/24/2016 and there may have also been some soft tissue in this area dating back to 05/26/2010. This is likely a benign etiology. There are low-density areas within this soft tissues suggesting there may be a fat or lipoma component. No evidence for adjacent bone destruction. Negative for ascites. Negative for free  air. Musculoskeletal: Old compression fractures with vertebral body cement at L1 and L2. Disc space narrowing at L4-L5. No evidence for a new vertebral body fracture. IMPRESSION: No acute abnormalities within the chest, abdomen or pelvis. Urinary bladder distension without hydronephrosis. Indeterminate soft tissue in the presacral space measuring up to 3.2 cm. This has been present for multiple years although there has probably been interval growth. Favor a benign etiology. There is low-density material within this structure and this could represent a benign myelolipoma. **An incidental finding of potential clinical significance has been found. Probable cystic or tubular structure along the superior aspect of the pancreatic body. This is poorly characterized on this examination but suspect this has been stable for at least 1 year if not longer. Consider further characterization of this area with MRI in 6 months.** Old compression fractures and previous vertebral body augmentation procedures. No evidence for an acute vertebral body compression fracture. Chronic changes and scarring in lungs particularly in the right upper lung and apex regions. Electronically Signed   By: Markus Daft M.D.   On: 01/14/2018 14:04   Ct Cervical Spine Wo Contrast  Result Date: 01/14/2018 CLINICAL DATA:  Fall last night landing on right side. EXAM: CT HEAD WITHOUT CONTRAST CT CERVICAL SPINE WITHOUT CONTRAST TECHNIQUE: Multidetector CT imaging of the head and cervical spine was performed following the standard protocol without intravenous contrast. Multiplanar CT image reconstructions of the cervical spine were also generated. COMPARISON:  None. FINDINGS: CT HEAD FINDINGS Brain: Ventricles, cisterns and other CSF spaces are within normal as there is mild age related atrophic change. Mild to moderate chronic ischemic microvascular disease is present. There is no mass, mass effect, shift of midline structures or acute hemorrhage. No  evidence of acute infarction. Vascular: No hyperdense vessel or unexpected calcification. Skull: Normal. Negative for fracture or focal lesion. Sinuses/Orbits: No acute finding. Other: None. CT CERVICAL SPINE FINDINGS Alignment: 2 mm anterior subluxation of C7 on T1 which is degenerative due to moderate facet arthropathy. Skull base and vertebrae: Moderate spondylosis of the cervical spine. Vertebral body heights are maintained. Atlantoaxial articulation is within normal. There is moderate uncovertebral joint spurring and facet arthropathy. There is neural foraminal narrowing at multiple levels due to adjacent bony spurring. There is no acute fracture. Soft tissues and spinal canal: No prevertebral fluid or swelling. No visible canal hematoma. Disc levels: Moderate disc space narrowing at the C5-6 and C6-7 levels. Upper chest: Negative. Other: None. IMPRESSION: No acute brain injury. Mild to moderate chronic ischemic microvascular disease and mild age related atrophic change. No acute cervical spine injury. Moderate spondylosis of the cervical spine with moderate disc disease at the C5-6 and C6-7 levels. Moderate bilateral neural foraminal narrowing at multiple levels  due to adjacent bony spurring. Electronically Signed   By: Marin Olp M.D.   On: 01/14/2018 10:13   Ct Abdomen Pelvis W Contrast  Result Date: 01/14/2018 CLINICAL DATA:  85 year old fell from bed.  Abdominal trauma. EXAM: CT CHEST, ABDOMEN, AND PELVIS WITH CONTRAST TECHNIQUE: Multidetector CT imaging of the chest, abdomen and pelvis was performed following the standard protocol during bolus administration of intravenous contrast. CONTRAST:  159m ISOVUE-300 IOPAMIDOL (ISOVUE-300) INJECTION 61% COMPARISON:  12/21/2016 FINDINGS: CT CHEST FINDINGS Cardiovascular: Atherosclerotic calcifications in the coronary arteries and throughout the thoracic aorta. No evidence to suggest aortic aneurysm or dissection. Main pulmonary arteries are patent. Heart  size is normal without significant pericardial fluid. Mediastinum/Nodes: Small hiatal hernia. No lymph node enlargement in the chest. Lungs/Pleura: Trachea and mainstem bronchi are patent. Negative for pneumothorax. Scarring at the lung apices, right side greater than left. Scarring and/or atelectasis at both lung bases. No large pleural effusions. 5 mm pleural-based nodule at the right lung base along the right major fissure on sequence 4, image 112. This was present on the study from 12/21/2016 and likely an incidental finding. Peripheral reticular densities in the right upper lung are suggestive for chronic changes, possibly mild fibrosis in these areas. No significant airspace disease or consolidation. Musculoskeletal: Both shoulders are located. Clavicles are intact. Old compression fracture at T9 with vertebral body cement. Old compression fracture at T11 with previous vertebral augmentation. No evidence for a displaced rib fracture. CT ABDOMEN PELVIS FINDINGS Hepatobiliary: Gallbladder is mildly distended without inflammatory changes. Normal appearance of the liver. No biliary dilatation. The main portal venous system is patent. Pancreas: Pancreas is poorly characterized. However, there appear to be a tubular or cystic structure along the top of the pancreatic body or adjacent to the pancreas body on sequence 2, image 57. This structure measures greater than 2.5 cm. In retrospect, this was present on 12/21/2016 and may have been present on the noncontrast study from 05/26/2010. No acute inflammatory changes involving the pancreas. Spleen: Normal in size without focal abnormality. Adrenals/Urinary Tract: Adrenal glands are unremarkable. Normal appearance of both kidneys without hydronephrosis. No suspicious renal lesions. Urinary bladder is markedly distended extending into the lower abdomen. Stomach/Bowel: Small hiatal hernia. No evidence for bowel obstruction or focal bowel inflammation. Vascular/Lymphatic:  Aorta and visceral arteries are heavily calcified without aneurysm. No lymph node enlargement in the abdomen or pelvis. Reproductive: Uterus and adnexal structures are unremarkable. Other: There is soft tissue in the presacral space on sequence 2, image 100. This area measures roughly 2.9 x 1.8 x 3.2 cm. Similar finding was present on the CT from 10/24/2016 and there may have also been some soft tissue in this area dating back to 05/26/2010. This is likely a benign etiology. There are low-density areas within this soft tissues suggesting there may be a fat or lipoma component. No evidence for adjacent bone destruction. Negative for ascites. Negative for free air. Musculoskeletal: Old compression fractures with vertebral body cement at L1 and L2. Disc space narrowing at L4-L5. No evidence for a new vertebral body fracture. IMPRESSION: No acute abnormalities within the chest, abdomen or pelvis. Urinary bladder distension without hydronephrosis. Indeterminate soft tissue in the presacral space measuring up to 3.2 cm. This has been present for multiple years although there has probably been interval growth. Favor a benign etiology. There is low-density material within this structure and this could represent a benign myelolipoma. **An incidental finding of potential clinical significance has been found. Probable cystic or tubular  structure along the superior aspect of the pancreatic body. This is poorly characterized on this examination but suspect this has been stable for at least 1 year if not longer. Consider further characterization of this area with MRI in 6 months.** Old compression fractures and previous vertebral body augmentation procedures. No evidence for an acute vertebral body compression fracture. Chronic changes and scarring in lungs particularly in the right upper lung and apex regions. Electronically Signed   By: Markus Daft M.D.   On: 01/14/2018 14:04   Dg Chest Port 1 View  Result Date:  01/14/2018 CLINICAL DATA:  Fall last night onto right side. EXAM: PORTABLE CHEST 1 VIEW COMPARISON:  10/01/2017 FINDINGS: Lungs are adequately inflated and otherwise clear. Cardiomediastinal silhouette is within normal. No acute fracture. Stable thoracic spine compression fractures. IMPRESSION: No acute findings. Electronically Signed   By: Marin Olp M.D.   On: 01/14/2018 10:20   Dg Femur 1v Right  Result Date: 01/14/2018 CLINICAL DATA:  Fall last night with right-sided pain. EXAM: RIGHT FEMUR 1 VIEW COMPARISON:  None. FINDINGS: Mild degenerate change of the right hip. Right total knee arthroplasty intact and normally located. No evidence of acute fracture or dislocation. IMPRESSION: No acute findings. Electronically Signed   By: Marin Olp M.D.   On: 01/14/2018 10:23    Assessment/Plan  . Chronic bilateral low back pain without sciatica Last MRI in 05/18 Patient had Bilateral Lumbar Medial Branch Block done on 11/19. But patient says she does not want to go through that.  On Chronic Ultram Will increase the dose to 50 mg BID Also start her on Robaxin 250 mg QHS PRN for Muscle Spasms   Current  major depressive disorder And Anxiety Patient is clearly Depressed  She is on  Wellbutrin 150 mg BID On Xanax BID Written for Psych consult. Patient has agreed  -Pancytopenia [facility]) Patient follows with Hematology Last visit was in 10/19 Will repeat CBC  Age-related osteoporosis  No Dexa scan report  Was on Tymlos per patient  Will repeat DEXA scan Essential hypertension BP stable on Cozaar, Hydralazine Repeat BMP LE edema Doing good on Demadex Repeat BMP Hypothyroid TSH was normal in 04/30 Hyperlipidemia Will start her on Lipitor 5 mg QD   Recurrent Cystitis Continue On Nitrofurantoin    Family/ staff Communication:   Labs/tests ordered:

## 2018-12-29 ENCOUNTER — Other Ambulatory Visit: Payer: Self-pay | Admitting: *Deleted

## 2018-12-29 DIAGNOSIS — F418 Other specified anxiety disorders: Secondary | ICD-10-CM

## 2018-12-29 MED ORDER — ALPRAZOLAM 0.25 MG PO TABS: ORAL_TABLET | ORAL | 0 refills | Status: DC

## 2018-12-29 NOTE — Telephone Encounter (Signed)
Transmission to Pharmacy FAILED.  Pended Rx and sent to Dr. [name] for refax.

## 2018-12-29 NOTE — Telephone Encounter (Signed)
Received fax request from [facility] NCCSRS Database Verified LR: 10/26/18 Pended Rx and sent to Dr. [name] for Approval.

## 2018-12-29 NOTE — Addendum Note (Signed)
Addended by: Nelda Severe A on: 12/29/2018 09:37 AM   Modules accepted: Orders

## 2019-01-01 ENCOUNTER — Non-Acute Institutional Stay: Payer: Medicare Other | Admitting: Family

## 2019-01-01 ENCOUNTER — Encounter: Payer: Self-pay | Admitting: Family

## 2019-01-01 DIAGNOSIS — G8929 Other chronic pain: Secondary | ICD-10-CM

## 2019-01-01 DIAGNOSIS — F418 Other specified anxiety disorders: Secondary | ICD-10-CM

## 2019-01-01 DIAGNOSIS — M545 Low back pain, unspecified: Secondary | ICD-10-CM

## 2019-01-01 NOTE — Progress Notes (Signed)
Location:  [place] Room Number: [place] of Service:  [facility] [phone]) Provider: Damondre Pfeifle FNP-C  Virgie Dad, MD  Patient Care Team: Virgie Dad, MD as PCP - General (Internal Medicine) Mast, Man X, NP as Nurse Practitioner (Internal Medicine)  Extended Emergency Contact Information Primary Emergency Contact: Wenz,[name] Address: [address]          [place], [place] [address] [place] Phone: [phone] Mobile Phone: [phone] Relation: Son Secondary Emergency Contact: Dakin,[name]  [name] Phone: [phone] Relation: None  Code Status:  DNR Goals of care: Advanced Directive information Advanced Directives 01/01/2019  Does Patient Have a Medical Advance Directive? Yes  Type of Advance Directive Out of facility DNR (pink MOST or yellow form)  Does patient want to make changes to medical advance directive? No - Patient declined  Copy of [place] in Chart? -  Pre-existing out of facility DNR order (yellow form or pink MOST form) Yellow form placed in chart (order not valid for inpatient use)     Chief Complaint  Patient presents with   Acute Visit    Increased confusion due to Tramadol and request for icepacks    HPI:  Pt is a 85 y.o. female seen today at [facility] for an acute visit for evaluation of lower back pain.she states tramadol making confused and does not think it helps the pain as tylenol.she states pain is worsen in the mornings when she gets up.Ice packs also works well for her usually uses it in the morning.she has a significant medical history of Degenerative disc disease and has had Kyphoplasty x 3 per patient.Facility Nurse reports tramadol makes patient more confused.   Also states her depression has worsen.she was recently seen my MD and Psychiatry consult was ordered 12/27/2018 but still awaiting to be seen by Psychiatry service.she is currently on Wellbutrin 150  mg twice daily and alprazolam 0.25 mg tablet at bedtime.       Past Medical History:  Diagnosis Date   Anemia    Anxiety    Chronotropic incompetence 12/2012    potentially medication related; noted on CPET    DDD (degenerative disc disease)    Eczema    GERD (gastroesophageal reflux disease)    H/O hiatal hernia    Hx: UTI (urinary tract infection)    Hyperlipidemia    Hypertension    Hypothyroidism    Osteopenia    Septicemia ([place]) 2002   following UTI   Vertigo, benign positional    Past Surgical History:  Procedure Laterality Date   BREAST SURGERY     left biopsy   CATARACT EXTRACTION Right    2 weeks ago   CPET / MET - PFTS     Consistent with chronotropic incompetence; See attached report in the results section   DILATION AND CURETTAGE OF UTERUS     x 2   DOPPLER ECHOCARDIOGRAPHY  01/10/2013   Normal LV size and function. Normal EF. Air sclerosis but no stenosis   ESOPHAGOGASTRODUODENOSCOPY  05/04/2012   Procedure: ESOPHAGOGASTRODUODENOSCOPY (EGD);  Surgeon: Inda Castle, MD;  Location: [facility];  Service: Endoscopy;  Laterality: N/A;   EYE SURGERY     cataract extraction with ILO  ? eye   IR KYPHO EA ADDL LEVEL THORACIC OR LUMBAR  10/28/2016   IR KYPHO LUMBAR INC FX REDUCE BONE BX UNI/BIL CANNULATION INC/IMAGING  10/28/2016   IR KYPHO THORACIC WITH  BONE BIOPSY  12/24/2016   IR RADIOLOGIST EVAL & MGMT  11/11/2016   KNEE ARTHROSCOPY  2011   right   TONSILLECTOMY     TOTAL KNEE ARTHROPLASTY  04/24/2012   Procedure: TOTAL KNEE ARTHROPLASTY;  Surgeon: Gearlean Alf, MD;  Location: [facility];  Service: Orthopedics;  Laterality: Right;   TRANSTHORACIC ECHOCARDIOGRAM  02/2018   Ordered by PCP for "congestive heart failure " -EF 60 M 65%.  GR 1 DD.  No R WMA--- > ESSENTIALLY NORMAL    No Known Allergies  Outpatient Encounter Medications as of 01/01/2019  Medication Sig   acetaminophen (TYLENOL) 500 MG tablet Take 2 tablets (1,000 mg total)  by mouth 2 (two) times daily. 500 mg tablet at bedtime   ALPRAZolam (XANAX) 0.25 MG tablet TAKE 1 TABLET BY MOUTH AT BEDTIME AS NEEDED FOR REST   amLODipine (NORVASC) 10 MG tablet Take 1 tablet (10 mg total) by mouth daily.   atorvastatin (LIPITOR) 10 MG tablet Take 10 mg by mouth daily.   buPROPion (WELLBUTRIN SR) 150 MG 12 hr tablet Take 1 tablet (150 mg total) by mouth 2 (two) times daily after a meal.   cholecalciferol (VITAMIN D) 1000 UNITS tablet Take 1,000 Units by mouth 2 (two) times a day.    docusate sodium (COLACE) 100 MG capsule Take 100 mg by mouth 2 (two) times daily.   Fiber POWD Take 15 mLs by mouth daily. Mix with 4-8 oz of water   fish oil-omega-3 fatty acids 1000 MG capsule Take 1 g by mouth daily.   hydrALAZINE (APRESOLINE) 25 MG tablet Take 1 tablet (25 mg total) by mouth 2 (two) times daily.   KLOR-CON M20 20 MEQ tablet TAKE 1 TABLET BY MOUTH TWICE A DAY   levothyroxine (SYNTHROID, LEVOTHROID) 75 MCG tablet Take 1 tablet (75 mcg total) by mouth daily before breakfast.   losartan (COZAAR) 100 MG tablet Take 1 tablet (100 mg total) by mouth daily.   nitrofurantoin (MACRODANTIN) 50 MG capsule Take 1 capsule (50 mg total) by mouth daily.   nystatin (MYCOSTATIN/NYSTOP) powder Apply 100,000 g topically 2 (two) times daily.   omeprazole (PRILOSEC) 40 MG capsule Take 40 mg by mouth daily.   torsemide (DEMADEX) 20 MG tablet TAKE 1 TABLET BY MOUTH TWICE A DAY   traMADol (ULTRAM) 50 MG tablet Take 1 tablet (50 mg total) by mouth 2 (two) times daily for 30 days.   No facility-administered encounter medications on file as of 01/01/2019.     Review of Systems  Constitutional: Negative for appetite change, chills, fatigue and fever.  HENT: Negative for congestion, rhinorrhea, sinus pressure, sinus pain, sneezing and sore throat.   Respiratory: Negative for cough, chest tightness, shortness of breath and wheezing.   Cardiovascular: Positive for leg swelling. Negative  for chest pain and palpitations.  Gastrointestinal: Negative for abdominal distention, abdominal pain, constipation, diarrhea, nausea and vomiting.  Genitourinary: Negative for difficulty urinating, dysuria, frequency and urgency.  Musculoskeletal: Positive for arthralgias, back pain and gait problem.  Neurological: Negative for dizziness, light-headedness and headaches.  Psychiatric/Behavioral: Negative for agitation, behavioral problems and sleep disturbance. The patient is nervous/anxious.        Reports worsening depression     Immunization History  Administered Date(s) Administered   Influenza, High Dose Seasonal PF 04/26/2016, 03/22/2017   Influenza,inj,Quad PF,6+ Mos 04/27/2018   Influenza-Unspecified 04/22/2011, 04/04/2012, 05/03/2013, 05/04/2013, 04/17/2014, 04/19/2015, 04/26/2016, 04/22/2017   Pneumococcal Polysaccharide-23 08/26/2003, 09/06/2011   Pneumococcal-Unspecified 12/20/2013   Td 09/26/2006  Zoster 10/25/2007   Zoster Recombinat (Shingrix) 11/21/2017, 04/08/2018   Pertinent  Health Maintenance Due  Topic Date Due   PNA vac Low Risk Adult (2 of 2 - PCV13) 12/21/2014   INFLUENZA VACCINE  02/24/2019   DEXA SCAN  Completed   Fall Risk  10/18/2018 08/23/2018 05/09/2018 05/01/2018 04/10/2018  Falls in the past year? 0 0 Yes Yes No  Comment - - - [facility]/2019 [facility] -  Number falls in past yr: 0 0 1 1 -  Injury with Fall? 0 0 No No -  Risk for fall due to : - - - Impaired balance/gait -  Follow up - - - Falls prevention discussed -    Vitals:   01/01/19 1140  BP: (!) 146/88  Pulse: 66  Resp: 20  Temp: (!) 97.2 F (36.2 C)  TempSrc: Oral  SpO2: 91%  Weight: 173 lb 6.4 oz (78.7 kg)  Height: '5\' 4"'  (1.626 m)   Body mass index is 29.76 kg/m. Physical Exam Constitutional:      General: She is not in acute distress.    Appearance: She is overweight.  HENT:     Head: Normocephalic.     Nose: No congestion or rhinorrhea.      Mouth/Throat:     Mouth: Mucous membranes are moist.     Pharynx: Oropharynx is clear. No oropharyngeal exudate or posterior oropharyngeal erythema.  Cardiovascular:     Rate and Rhythm: Normal rate and regular rhythm.     Pulses: Normal pulses.     Heart sounds: Normal heart sounds. No murmur. No friction rub. No gallop.   Pulmonary:     Effort: Pulmonary effort is normal. No respiratory distress.     Breath sounds: Normal breath sounds. No wheezing, rhonchi or rales.  Chest:     Chest wall: No tenderness.  Abdominal:     General: Bowel sounds are normal. There is no distension.     Palpations: Abdomen is soft. There is no mass.     Tenderness: There is no abdominal tenderness. There is no right CVA tenderness, left CVA tenderness, guarding or rebound.  Musculoskeletal:        General: No tenderness.     Comments: Unsteady gait ambulates with walker and has power wheelchair for long distance.bilateral lower extremities pitting edema stable.Knee high ted hose off during visit after showering.  Skin:    General: Skin is warm and dry.     Coloration: Skin is not pale.     Findings: No bruising, erythema or rash.  Neurological:     Mental Status: She is alert and oriented to person, place, and time.     Cranial Nerves: No cranial nerve deficit.     Sensory: No sensory deficit.     Gait: Gait abnormal.  Psychiatric:        Mood and Affect: Mood is anxious and depressed. Affect is tearful.        Speech: Speech normal.        Behavior: Behavior normal.        Thought Content: Thought content normal.        Judgment: Judgment normal.     Labs reviewed: Recent Labs    05/03/18 1505 08/07/18 0745 11/23/18 0000  NA 137 139 134*  K 4.6 4.2 4.1  CL 97 100 96*  CO2 '23 26 31  ' GLUCOSE 108* 90 88  BUN 35* 25 27*  CREATININE 1.04* 1.04* 1.30*  CALCIUM  9.7 10.5* 10.2   Recent Labs    01/14/18 0928 01/23/18  04/03/18 0810 08/07/18 0745 11/23/18 0000  AST 60* 24   < > '28 30 29    ' ALT 26 21   < > '20 22 22  ' ALKPHOS 85 84  --   --   --   --   BILITOT 0.8  --    < > 0.3 0.3 0.3  PROT 7.9 6.6   < > 7.2 7.6 7.6  ALBUMIN 4.3 3.9  --   --   --   --    < > = values in this interval not displayed.   Recent Labs    08/07/18 0745 09/21/18 0815 11/23/18 0000  WBC 3.1* 2.9* 3.6*  NEUTROABS 1,494* 1,441* 2,048  HGB 10.5* 10.1* 10.7*  HCT 32.4* 30.6* 32.8*  MCV 86.9 86.0 85.4  PLT 79* 83* 88*   Lab Results  Component Value Date   TSH 2.90 11/23/2018   No results found for: HGBA1C Lab Results  Component Value Date   CHOL 227 (H) 11/23/2018   HDL 60 11/23/2018   LDLCALC 141 (H) 11/23/2018   TRIG 140 11/23/2018   CHOLHDL 3.8 11/23/2018    Significant Diagnostic Results in last 30 days:  No results found.  Assessment/Plan 1. Depression with anxiety Mood depressive and tearful at times during visit.continue on Wellbutrin 150 mg Tablet one by mouth twice daily and Alprazolam 0.25 mg tablet at bedtime.Add Sertraline 25 mg tablet one by mouth daily.continue to monitor for mood changes.Follow up with Psychiatry service per MD consult.    2. Chronic bilateral low back pain without sciatica  increased confusion reported with tramadol recently increased to 50 mg tablet twice daily prefers to take Tylenol and use ice packs. - continue on Tylenol 1000 mg tablet twice daily and 500 mg tablet at bedtime. - Apply ice packs to lower back  for 10 minutes every 8 hours as needed. - Apply Salon pas topical patch to lower back daily for pain.Remove old patch.  - change Tramadol 50 mg tablet to one by mouth twice daily as needed.    Family/ staff Communication: Reviewed plan of care with patient and facility Nurse supervisor  Labs/tests ordered: None   Sandrea Hughs, NP

## 2019-01-03 ENCOUNTER — Other Ambulatory Visit: Payer: Self-pay | Admitting: Internal Medicine

## 2019-01-03 DIAGNOSIS — I5032 Chronic diastolic (congestive) heart failure: Secondary | ICD-10-CM

## 2019-01-03 DIAGNOSIS — I110 Hypertensive heart disease with heart failure: Secondary | ICD-10-CM

## 2019-01-04 LAB — BASIC METABOLIC PANEL
BUN: 28 — AB (ref 4–21)
BUN: 28 — AB (ref 4–21)
CO2: 29 — AB (ref 13–22)
Chloride: 101 (ref 99–108)
Creatinine: 1.2 — AB (ref 0.5–1.1)
Creatinine: 1.2 — AB (ref 0.5–1.1)
Glucose: 91
Glucose: 91
Potassium: 3.9 (ref 3.4–5.3)
Potassium: 3.9 (ref 3.4–5.3)
Sodium: 138 (ref 137–147)
Sodium: 138 (ref 137–147)

## 2019-01-04 LAB — CBC AND DIFFERENTIAL
HCT: 29 — AB (ref 36–46)
HCT: 29 — AB (ref 36–46)
Hemoglobin: 9.3 — AB (ref 12.0–16.0)
Hemoglobin: 9.3 — AB (ref 12.0–16.0)
Platelets: 89 — AB (ref 150–399)
Platelets: 89 — AB (ref 150–399)
WBC: 3.3

## 2019-01-04 LAB — COMPREHENSIVE METABOLIC PANEL
Albumin: 3.9 (ref 3.5–5.0)
Calcium: 9.6 (ref 8.7–10.7)
GFR calc Af Amer: 46
GFR calc non Af Amer: 40
Globulin: 2.8

## 2019-01-04 LAB — HEPATIC FUNCTION PANEL
ALT: 17 (ref 7–35)
ALT: 17 (ref 7–35)
AST: 21 (ref 13–35)
AST: 31 (ref 13–35)
Alkaline Phosphatase: 97 (ref 25–125)
Bilirubin, Total: 0.3

## 2019-01-04 LAB — CBC: RBC: 3.26 — AB (ref 3.87–5.11)

## 2019-01-19 LAB — CBC AND DIFFERENTIAL
HCT: 28 — AB (ref 36–46)
Hemoglobin: 8.9 — AB (ref 12.0–16.0)
Platelets: 74 — AB (ref 150–399)
WBC: 3.6

## 2019-01-22 LAB — NOVEL CORONAVIRUS, NAA: SARS-CoV-2, NAA: NOT DETECTED

## 2019-01-24 ENCOUNTER — Other Ambulatory Visit: Payer: Self-pay

## 2019-01-24 ENCOUNTER — Encounter: Payer: Medicare Other | Admitting: Internal Medicine

## 2019-01-25 ENCOUNTER — Telehealth: Payer: Self-pay | Admitting: Oncology

## 2019-01-25 ENCOUNTER — Non-Acute Institutional Stay: Payer: Medicare Other | Admitting: Internal Medicine

## 2019-01-25 ENCOUNTER — Encounter: Payer: Self-pay | Admitting: Internal Medicine

## 2019-01-25 DIAGNOSIS — G8929 Other chronic pain: Secondary | ICD-10-CM | POA: Diagnosis not present

## 2019-01-25 DIAGNOSIS — D61818 Other pancytopenia: Secondary | ICD-10-CM

## 2019-01-25 DIAGNOSIS — M545 Low back pain, unspecified: Secondary | ICD-10-CM

## 2019-01-25 DIAGNOSIS — I10 Essential (primary) hypertension: Secondary | ICD-10-CM | POA: Diagnosis not present

## 2019-01-25 NOTE — Progress Notes (Signed)
Location:  [facility] Room Number: 64 Place of Service:  [facility] [phone]) Provider:  L,MD   Virgie Dad, MD  Patient Care Team: Virgie Dad, MD as PCP - General (Internal Medicine) Mast, Man X, NP as Nurse Practitioner (Internal Medicine)  Extended Emergency Contact Information Primary Emergency Contact: Rufus,[name] Address: [address]          [place], [place] [address] [place] Phone: [phone] Mobile Phone: [phone] Relation: Son Secondary Emergency Contact: Facey,[name]  [place] Phone: [phone] Relation: None  Code Status:DNR Goals of care: Advanced Directive information Advanced Directives 01/25/2019  Does Patient Have a Medical Advance Directive? Yes  Type of Advance Directive Out of facility DNR (pink MOST or yellow form)  Does patient want to make changes to medical advance directive? No - Patient declined  Copy of [place] in Chart? -  Pre-existing out of facility DNR order (yellow form or pink MOST form) Yellow form placed in chart (order not valid for inpatient use)     Chief Complaint  Patient presents with  . Acute Visit    Pancytopenia   . Health Maintenance    pcv 13, tetanus vaccine    HPI:  Pt is a 85 y.o. female seen today for an acute visit for Follow up for her Pancytopenia and Pain   Patient has h/o Chronic Back Pain, Hypertension, Hypothyroidism, Hypertension, Recurrent Cystitis, Osteoporosis and Major Depression [place] unable to take care of herself She has h/o Thrombocytopenia and Was following with Hematology . Last Visit was in 10/19 Her Repeat CBC in facility shows Pancytopenia. Her Hgb is 8.9 Platelets are 74 and White Count is 3.6 Patient denies any acute problems. She continues to have Low back pain. Mood still stays Depressed. Her weight is stable  Past Medical History:  Diagnosis Date  .  Anemia   . Anxiety   . Chronotropic incompetence 12/2012    potentially medication related; noted on CPET   . DDD (degenerative disc disease)   . Eczema   . GERD (gastroesophageal reflux disease)   . H/O hiatal hernia   . Hx: UTI (urinary tract infection)   . Hyperlipidemia   . Hypertension   . Hypothyroidism   . Osteopenia   . Septicemia ([facility]) 2002   following UTI  . Vertigo, benign positional    Past Surgical History:  Procedure Laterality Date  . BREAST SURGERY     left biopsy  . CATARACT EXTRACTION Right    2 weeks ago  . CPET / MET - PFTS     Consistent with chronotropic incompetence; See attached report in the results section  . DILATION AND CURETTAGE OF UTERUS     x 2  . DOPPLER ECHOCARDIOGRAPHY  01/10/2013   Normal LV size and function. Normal EF. Air sclerosis but no stenosis  . ESOPHAGOGASTRODUODENOSCOPY  05/04/2012   Procedure: ESOPHAGOGASTRODUODENOSCOPY (EGD);  Surgeon: Inda Castle, MD;  Location: [facility];  Service: Endoscopy;  Laterality: N/A;  . EYE SURGERY     cataract extraction with ILO  ? eye  . IR KYPHO EA ADDL LEVEL THORACIC OR LUMBAR  10/28/2016  . IR KYPHO LUMBAR INC FX REDUCE BONE BX UNI/BIL CANNULATION INC/IMAGING  10/28/2016  . IR KYPHO THORACIC WITH BONE BIOPSY  12/24/2016  . IR RADIOLOGIST EVAL & MGMT  11/11/2016  . KNEE ARTHROSCOPY  2011  right  . TONSILLECTOMY    . TOTAL KNEE ARTHROPLASTY  04/24/2012   Procedure: TOTAL KNEE ARTHROPLASTY;  Surgeon: Gearlean Alf, MD;  Location: [facility];  Service: Orthopedics;  Laterality: Right;  . TRANSTHORACIC ECHOCARDIOGRAM  02/2018   Ordered by PCP for "congestive heart failure " -EF 60 M 65%.  GR 1 DD.  No R WMA--- > ESSENTIALLY NORMAL    No Known Allergies  Outpatient Encounter Medications as of 01/25/2019  Medication Sig  . acetaminophen (TYLENOL) 325 MG tablet Take 650 mg by mouth every 4 (four) hours as needed.  Marland Kitchen acetaminophen (TYLENOL) 500 MG tablet Take 1,000 mg by mouth 2 (two) times a day.   . ALPRAZolam (XANAX) 0.25 MG tablet TAKE 1 TABLET BY MOUTH AT BEDTIME AS NEEDED FOR REST  . amLODipine (NORVASC) 10 MG tablet TAKE 1 TABLET BY MOUTH  DAILY  . atorvastatin (LIPITOR) 10 MG tablet Take 5 mg by mouth daily.   Marland Kitchen buPROPion (WELLBUTRIN SR) 150 MG 12 hr tablet Take 1 tablet (150 mg total) by mouth 2 (two) times daily after a meal.  . Calcium-Phosphorus-Vitamin D (CITRACAL +D3 PO) Take 1 capsule by mouth 2 (two) times a day. 691m-25mcg(1000u)  . Camphor-Menthol-Methyl Sal 3.07-31-08 % PTCH Place 1 patch onto the skin. Apply one to lower back daily for back pain  . docusate sodium (COLACE) 100 MG capsule Take 100 mg by mouth 2 (two) times daily.  . Fiber POWD Take 15 mLs by mouth daily. Mix with 4-8 oz of water  . fish oil-omega-3 fatty acids 1000 MG capsule Take 1 g by mouth daily.  . hydrALAZINE (APRESOLINE) 25 MG tablet Take 1 tablet (25 mg total) by mouth 2 (two) times daily.  .Marland KitchenKLOR-CON M20 20 MEQ tablet TAKE 1 TABLET BY MOUTH TWICE A DAY  . levothyroxine (SYNTHROID, LEVOTHROID) 75 MCG tablet Take 1 tablet (75 mcg total) by mouth daily before breakfast.  . losartan (COZAAR) 100 MG tablet Take 1 tablet (100 mg total) by mouth daily.  . methocarbamol (ROBAXIN) 500 MG tablet Take 250 mg by mouth daily. For muscle spasms  . nitrofurantoin (MACRODANTIN) 50 MG capsule Take 1 capsule (50 mg total) by mouth daily.  .Marland Kitchennystatin (MYCOSTATIN/NYSTOP) powder Apply 100,000 g topically 2 (two) times daily.  .Marland Kitchenomeprazole (PRILOSEC) 40 MG capsule Take 40 mg by mouth daily.  . polyethylene glycol (MIRALAX / GLYCOLAX) 17 g packet Take 17 g by mouth daily. For constipation  . sertraline (ZOLOFT) 25 MG tablet Take 25 mg by mouth daily.  .Marland Kitchentorsemide (DEMADEX) 20 MG tablet TAKE 1 TABLET BY MOUTH TWICE A DAY  . traMADol (ULTRAM) 50 MG tablet Take 1 tablet (50 mg total) by mouth 2 (two) times daily for 30 days.  . [DISCONTINUED] acetaminophen (TYLENOL) 500 MG tablet Take 2 tablets (1,000 mg total) by mouth 2  (two) times daily. 500 mg tablet at bedtime (Patient taking differently: Take 1,000 mg by mouth 2 (two) times daily. )  . [DISCONTINUED] cholecalciferol (VITAMIN D) 1000 UNITS tablet Take 1,000 Units by mouth 2 (two) times a day.    No facility-administered encounter medications on file as of 01/25/2019.     Review of Systems  Constitutional: Negative.   HENT: Negative.   Respiratory: Negative.   Cardiovascular: Negative.   Gastrointestinal: Positive for constipation.  Genitourinary: Negative.   Musculoskeletal: Positive for back pain.  Skin: Negative.   Neurological: Positive for weakness.  Psychiatric/Behavioral: Positive for dysphoric mood. The patient is nervous/anxious.     .Marland Kitchen  Immunization History  Administered Date(s) Administered  . Influenza, High Dose Seasonal PF 04/26/2016, 03/22/2017  . Influenza,inj,Quad PF,6+ Mos 04/27/2018  . Influenza-Unspecified 04/22/2011, 04/04/2012, 05/03/2013, 05/04/2013, 04/17/2014, 04/19/2015, 04/26/2016, 04/22/2017  . Pneumococcal Polysaccharide-23 08/26/2003, 09/06/2011  . Pneumococcal-Unspecified 12/20/2013  . Td 09/26/2006  . Zoster 10/25/2007  . Zoster Recombinat (Shingrix) 11/21/2017, 04/08/2018   Pertinent  Health Maintenance Due  Topic Date Due  . PNA vac Low Risk Adult (2 of 2 - PCV13) 12/21/2014  . INFLUENZA VACCINE  02/24/2019  . DEXA SCAN  Completed   Fall Risk  10/18/2018 08/23/2018 05/09/2018 05/01/2018 04/10/2018  Falls in the past year? 0 0 Yes Yes No  Comment - - - fell in June/2019 [facility] -  Number falls in past yr: 0 0 1 1 -  Injury with Fall? 0 0 No No -  Risk for fall due to : - - - Impaired balance/gait -  Follow up - - - Falls prevention discussed -   Functional Status Survey:    Vitals:   01/25/19 1048  BP: 139/70  Pulse: 80  Resp: 18  Temp: (!) 96.1 F (35.6 C)  SpO2: 92%  Weight: 174 lb 6.4 oz (79.1 kg)  Height: '5\' 4"'  (1.626 m)   Body mass index is 29.94 kg/m. Physical Exam  Vitals signs reviewed.  Constitutional:      Appearance: Normal appearance. She is obese.  HENT:     Head: Normocephalic.     Nose: Nose normal.     Mouth/Throat:     Mouth: Mucous membranes are moist.     Pharynx: Oropharynx is clear.  Eyes:     Pupils: Pupils are equal, round, and reactive to light.  Neck:     Musculoskeletal: Neck supple.  Cardiovascular:     Rate and Rhythm: Normal rate and regular rhythm.     Pulses: Normal pulses.  Pulmonary:     Effort: Pulmonary effort is normal. No respiratory distress.  Abdominal:     General: Abdomen is flat. Bowel sounds are normal.     Palpations: Abdomen is soft.  Musculoskeletal:     Comments: Mild swelling Bilateral  Skin:    General: Skin is warm and dry.  Neurological:     General: No focal deficit present.     Mental Status: She is alert and oriented to person, place, and time.  Psychiatric:        Mood and Affect: Mood normal.     Comments: Anxious      Labs reviewed: Recent Labs    05/03/18 1505 08/07/18 0745 11/23/18 0000 01/04/19  NA 137 139 134* 138  K 4.6 4.2 4.1 3.9  CL 97 100 96*  --   CO2 '23 26 31  ' --   GLUCOSE 108* 90 88  --   BUN 35* 25 27* 28*  CREATININE 1.04* 1.04* 1.30* 1.2*  CALCIUM 9.7 10.5* 10.2  --    Recent Labs    04/03/18 0810 08/07/18 0745 11/23/18 0000 01/04/19  AST '28 30 29 21  ' ALT '20 22 22 17  ' BILITOT 0.3 0.3 0.3  --   PROT 7.2 7.6 7.6  --    Recent Labs    08/07/18 0745 09/21/18 0815 11/23/18 0000 01/04/19  WBC 3.1* 2.9* 3.6*  --   NEUTROABS 1,494* 1,441* 2,048  --   HGB 10.5* 10.1* 10.7* 9.3*  HCT 32.4* 30.6* 32.8* 29*  MCV 86.9 86.0 85.4  --   PLT 79* 83*  88* 89*   Lab Results  Component Value Date   TSH 2.90 11/23/2018   No results found for: HGBA1C Lab Results  Component Value Date   CHOL 227 (H) 11/23/2018   HDL 60 11/23/2018   LDLCALC 141 (H) 11/23/2018   TRIG 140 11/23/2018   CHOLHDL 3.8 11/23/2018    Significant Diagnostic Results in last 30  days:  No results found.  Assessment/Plan Pancytopenia [facility]) Patient follows with Hematology Last visit was in 10/19 Will fax them the results and set up Appointment with Hematology   Other Issues  Chronic bilateral low back pain without sciatica Last MRI in 05/18 Patient had Bilateral Lumbar Medial Branch Block done on 11/19. But patient says she does not want to go through that.  On Chronic Ultram PRN Also On Tyelenol  Current major depressive disorder And Anxiety Patient is clearly Depressed  She is on Wellbutrin 150 mg BID and Zoloft  On Xanax BID Written for Psych consult. Patient has agreed  Age-related osteoporosis No Dexa scan report  Was on Tymlos per patient   repeat DEXA scanpending Essential hypertension BP stable on Cozaar, Hydralazine Repeat BMP was stable LE edema Doing good on Demadex  Hypothyroid TSH was normal in 04/30 Hyperlipidemia on Lipitor 5 mg QD  Recurrent Cystitis Continue On Nitrofurantoin   Family/ staff Communication:   Labs/tests ordered:   .Total time spent in this patient care encounter was  25_  minutes; greater than 50% of the visit spent counseling patient and staff, reviewing records , Labs and coordinating care for problems addressed at this encounter.

## 2019-01-25 NOTE — Telephone Encounter (Signed)
Tried to call pt per 7/2 sch message - no answer and no vmail .

## 2019-02-06 ENCOUNTER — Telehealth: Payer: Self-pay

## 2019-02-06 NOTE — Telephone Encounter (Signed)
On 02/02/19 received a call from Elmyra Ricks from [facility] stating that they have faxed lab work and have been awaiting an appt date and time for patient f/u but have not received yet at this time. Explained that have not received labs and requested to refax. Elmyra Ricks stated that facility MD has requested a f/u for pt with Dr. Alen Blew based on lab results. Explained that once receive lab results will get to Dr. Alen Blew, and if he feels f/u is necessary then a scheduling message will be sent. Explained that will place facility contact in chart and in appt request.

## 2019-02-16 ENCOUNTER — Non-Acute Institutional Stay: Payer: Medicare Other | Admitting: Family

## 2019-02-16 ENCOUNTER — Encounter: Payer: Self-pay | Admitting: Family

## 2019-02-16 DIAGNOSIS — F418 Other specified anxiety disorders: Secondary | ICD-10-CM | POA: Diagnosis not present

## 2019-02-16 DIAGNOSIS — K2100 Gastro-esophageal reflux disease with esophagitis, without bleeding: Secondary | ICD-10-CM

## 2019-02-16 DIAGNOSIS — E782 Mixed hyperlipidemia: Secondary | ICD-10-CM | POA: Diagnosis not present

## 2019-02-16 DIAGNOSIS — M549 Dorsalgia, unspecified: Secondary | ICD-10-CM

## 2019-02-16 DIAGNOSIS — G8929 Other chronic pain: Secondary | ICD-10-CM

## 2019-02-16 DIAGNOSIS — E039 Hypothyroidism, unspecified: Secondary | ICD-10-CM

## 2019-02-16 DIAGNOSIS — I10 Essential (primary) hypertension: Secondary | ICD-10-CM | POA: Diagnosis not present

## 2019-02-16 DIAGNOSIS — D61818 Other pancytopenia: Secondary | ICD-10-CM

## 2019-02-16 NOTE — Progress Notes (Signed)
Location:  [facility] Room Number: [place] of Service:  [facility] [phone]) Provider: Karmine Kauer FNP-C   Virgie Dad, MD  Patient Care Team: Virgie Dad, MD as PCP - General (Internal Medicine) Mast, Man X, NP as Nurse Practitioner (Internal Medicine)  Extended Emergency Contact Information Primary Emergency Contact: Zachery,[name] Address: [address]          [place], [place] [address] [place] Phone: [phone] Mobile Phone: [phone] Relation: Son Secondary Emergency Contact: Giron,[name]  [place] Phone: [phone] Relation: None  Code Status:  DNR  Goals of care: Advanced Directive information Advanced Directives 02/16/2019  Does Patient Have a Medical Advance Directive? Yes  Type of Advance Directive Out of facility DNR (pink MOST or yellow form);Healthcare Power of Attorney  Does patient want to make changes to medical advance directive? No - Patient declined  Copy of Arab in Chart? Yes - validated most recent copy scanned in chart (See row information)  Pre-existing out of facility DNR order (yellow form or pink MOST form) Yellow form placed in chart (order not valid for inpatient use)     Chief Complaint  Patient presents with  . Medical Management of Chronic Issues    Routine Visit    HPI:  Pt is a 85 y.o. female seen today [facility] for medical management of chronic diseases. She is seen in her room today on her recliner.she denies any acute issues during visit.she states has an upcoming telephone appointment with hematology on 02/22/2019 at 1 pm.she was recently referred to Hematologist by MD for pancytopenia.she has had no recent fall episode or weight changes.she states her appetite is good.Facility Nurse reports no new concerns.      Past Medical History:  Diagnosis Date  . Anemia   . Anxiety   . Chronotropic incompetence 12/2012    potentially medication related;  noted on CPET   . DDD (degenerative disc disease)   . Eczema   . GERD (gastroesophageal reflux disease)   . H/O hiatal hernia   . Hx: UTI (urinary tract infection)   . Hyperlipidemia   . Hypertension   . Hypothyroidism   . Osteopenia   . Septicemia ([place]) 2002   following UTI  . Vertigo, benign positional    Past Surgical History:  Procedure Laterality Date  . BREAST SURGERY     left biopsy  . CATARACT EXTRACTION Right    2 weeks ago  . CPET / MET - PFTS     Consistent with chronotropic incompetence; See attached report in the results section  . DILATION AND CURETTAGE OF UTERUS     x 2  . DOPPLER ECHOCARDIOGRAPHY  01/10/2013   Normal LV size and function. Normal EF. Air sclerosis but no stenosis  . ESOPHAGOGASTRODUODENOSCOPY  05/04/2012   Procedure: ESOPHAGOGASTRODUODENOSCOPY (EGD);  Surgeon: Inda Castle, MD;  Location: [facility];  Service: Endoscopy;  Laterality: N/A;  . EYE SURGERY     cataract extraction with ILO  ? eye  . IR KYPHO EA ADDL LEVEL THORACIC OR LUMBAR  10/28/2016  . IR KYPHO LUMBAR INC FX REDUCE BONE BX UNI/BIL CANNULATION INC/IMAGING  10/28/2016  . IR KYPHO THORACIC WITH BONE BIOPSY  12/24/2016  . IR RADIOLOGIST EVAL & MGMT  11/11/2016  . KNEE ARTHROSCOPY  2011   right  . TONSILLECTOMY    . TOTAL KNEE ARTHROPLASTY  04/24/2012   Procedure: TOTAL KNEE ARTHROPLASTY;  Surgeon: Gearlean Alf, MD;  Location: [facility];  Service: Orthopedics;  Laterality: Right;  . TRANSTHORACIC ECHOCARDIOGRAM  02/2018   Ordered by PCP for "congestive heart failure " -EF 60 M 65%.  GR 1 DD.  No R WMA--- > ESSENTIALLY NORMAL    No Known Allergies  Allergies as of 02/16/2019   No Known Allergies     Medication List       Accurate as of February 16, 2019  5:36 PM. If you have any questions, ask your nurse or doctor.        acetaminophen 325 MG tablet Commonly known as: TYLENOL Take 650 mg by mouth every 4 (four) hours as needed.   acetaminophen 500 MG tablet Commonly known  as: TYLENOL Take 1,000 mg by mouth 2 (two) times a day.   ALPRAZolam 0.25 MG tablet Commonly known as: XANAX TAKE 1 TABLET BY MOUTH AT BEDTIME AS NEEDED FOR REST   amLODipine 10 MG tablet Commonly known as: NORVASC TAKE 1 TABLET BY MOUTH  DAILY   atorvastatin 10 MG tablet Commonly known as: LIPITOR Take 5 mg by mouth daily.   buPROPion 150 MG 12 hr tablet Commonly known as: WELLBUTRIN SR Take 1 tablet (150 mg total) by mouth 2 (two) times daily after a meal.   Camphor-Menthol-Methyl Sal 3.07-31-08 % Ptch Place 1 patch onto the skin. Apply one to lower back daily for back pain   CITRACAL +D3 PO Take 1 capsule by mouth 2 (two) times a day. 625m-25mcg(1000u)   docusate sodium 100 MG capsule Commonly known as: COLACE Take 100 mg by mouth 2 (two) times daily.   Fiber Powd Take 15 mLs by mouth daily. Mix with 4-8 oz of water   fish oil-omega-3 fatty acids 1000 MG capsule Take 1 g by mouth daily.   hydrALAZINE 25 MG tablet Commonly known as: APRESOLINE Take 1 tablet (25 mg total) by mouth 2 (two) times daily.   Klor-Con M20 20 MEQ tablet Generic drug: potassium chloride SA TAKE 1 TABLET BY MOUTH TWICE A DAY   levothyroxine 75 MCG tablet Commonly known as: SYNTHROID Take 1 tablet (75 mcg total) by mouth daily before breakfast.   losartan 100 MG tablet Commonly known as: COZAAR Take 1 tablet (100 mg total) by mouth daily.   methocarbamol 500 MG tablet Commonly known as: ROBAXIN Take 250 mg by mouth daily. For muscle spasms   nitrofurantoin 50 MG capsule Commonly known as: MACRODANTIN Take 1 capsule (50 mg total) by mouth daily.   nystatin powder Commonly known as: MYCOSTATIN/NYSTOP Apply 100,000 g topically 2 (two) times daily.   omeprazole 40 MG capsule Commonly known as: PRILOSEC Take 40 mg by mouth daily.   polyethylene glycol 17 g packet Commonly known as: MIRALAX / GLYCOLAX Take 17 g by mouth daily. For constipation   sertraline 25 MG tablet Commonly  known as: ZOLOFT Take 25 mg by mouth daily.   torsemide 20 MG tablet Commonly known as: DEMADEX TAKE 1 TABLET BY MOUTH TWICE A DAY   traMADol 50 MG tablet Commonly known as: ULTRAM Take 50 mg by mouth 2 (two) times daily as needed.       Review of Systems  Constitutional: Negative for appetite change, chills, fatigue, fever and unexpected weight change.  HENT: Negative for congestion, rhinorrhea, sinus pressure, sinus pain, sneezing and sore throat.   Eyes: Negative for pain, discharge, redness and itching.  Respiratory: Negative for cough, chest tightness, shortness of breath and wheezing.  Cardiovascular: Positive for leg swelling. Negative for chest pain and palpitations.  Gastrointestinal: Negative for abdominal distention, abdominal pain, constipation, diarrhea, nausea and vomiting.  Endocrine: Negative for cold intolerance, heat intolerance, polydipsia, polyphagia and polyuria.  Genitourinary: Negative for difficulty urinating, dysuria, flank pain, frequency and urgency.  Musculoskeletal: Positive for arthralgias, back pain and gait problem. Negative for joint swelling.  Skin: Negative for color change, pallor, rash and wound.  Neurological: Negative for dizziness, light-headedness, numbness and headaches.  Hematological: Does not bruise/bleed easily.  Psychiatric/Behavioral: Negative for agitation, behavioral problems, confusion and sleep disturbance. The patient is not nervous/anxious.     Immunization History  Administered Date(s) Administered  . Influenza, High Dose Seasonal PF 04/26/2016, 03/22/2017  . Influenza,inj,Quad PF,6+ Mos 04/27/2018  . Influenza-Unspecified 04/22/2011, 04/04/2012, 05/03/2013, 05/04/2013, 04/17/2014, 04/19/2015, 04/26/2016, 04/22/2017  . Pneumococcal Polysaccharide-23 08/26/2003, 09/06/2011  . Pneumococcal-Unspecified 12/20/2013  . Td 09/26/2006  . Zoster 10/25/2007  . Zoster Recombinat (Shingrix) 11/21/2017, 04/08/2018   Pertinent  Health  Maintenance Due  Topic Date Due  . PNA vac Low Risk Adult (2 of 2 - PCV13) 12/21/2014  . INFLUENZA VACCINE  02/24/2019  . DEXA SCAN  Completed   Fall Risk  10/18/2018 08/23/2018 05/09/2018 05/01/2018 04/10/2018  Falls in the past year? 0 0 Yes Yes No  Comment - - - fell in June/2019 _0  Home (asst living) -  Number falls in past yr: 0 0 1 1 -  Injury with Fall? 0 0 No No -  Risk for fall due to : - - - Impaired balance/gait -  Follow up - - - Falls prevention discussed -    Vitals:   02/16/19 1139  BP: 130/72  Pulse: 72  Resp: 20  Temp: 98.1 F (36.7 C)  TempSrc: Oral  SpO2: 90%  Weight: 173 lb (78.5 kg)  Height: _1  (1.626 m)   Body mass index is 29.7 kg/m. Physical Exam Vitals signs and nursing note reviewed.  Constitutional:      General: She is not in acute distress.    Appearance: She is overweight. She is not ill-appearing.  HENT:     Head: Normocephalic.     Right Ear: Tympanic membrane, ear canal and external ear normal. There is no impacted cerumen.     Left Ear: Tympanic membrane, ear canal and external ear normal. There is no impacted cerumen.     Nose: Nose normal. No congestion or rhinorrhea.     Mouth/Throat:     Mouth: Mucous membranes are moist.     Pharynx: Oropharynx is clear. No oropharyngeal exudate or posterior oropharyngeal erythema.  Eyes:     General: No scleral icterus.       Right eye: No discharge.        Left eye: No discharge.     Conjunctiva/sclera: Conjunctivae normal.     Pupils: Pupils are equal, round, and reactive to light.  Neck:     Musculoskeletal: Normal range of motion. No neck rigidity or muscular tenderness.     Vascular: No carotid bruit.  Cardiovascular:     Rate and Rhythm: Normal rate and regular rhythm.     Pulses: Normal pulses.     Heart sounds: Normal heart sounds. No murmur. No friction rub. No gallop.   Pulmonary:     Effort: Pulmonary effort is normal. No respiratory distress.     Breath sounds: Normal  breath sounds. No wheezing, rhonchi or rales.  Chest:     Chest wall: No tenderness.  Abdominal:     General: Bowel sounds are normal. There is no distension.     Palpations: Abdomen is soft. There is no mass.     Tenderness: There is no abdominal tenderness. There is no right CVA tenderness, left CVA tenderness, guarding or rebound.  Musculoskeletal:     Comments: Unsteady gait walks short distance with walker and has power wheelchair for long distance.Bilateral lower extremities trace -1+  edema.  Lymphadenopathy:     Cervical: No cervical adenopathy.  Skin:    General: Skin is warm and dry.     Coloration: Skin is not jaundiced or pale.     Findings: No bruising, erythema or rash.  Neurological:     Mental Status: She is alert and oriented to person, place, and time.     Cranial Nerves: No cranial nerve deficit.     Sensory: No sensory deficit.     Motor: No weakness.     Coordination: Coordination normal.     Gait: Gait abnormal.  Psychiatric:        Mood and Affect: Mood normal.        Behavior: Behavior normal.        Thought Content: Thought content normal.        Judgment: Judgment normal.    Labs reviewed: Recent Labs    05/03/18 1505 08/07/18 0745 11/23/18 0000 01/04/19  NA 137 139 134* 138  K 4.6 4.2 4.1 3.9  CL 97 100 96*  --   CO2 _0 --   GLUCOSE 108* 90 88  --   BUN 35* 25 27* 28*  CREATININE 1.04* 1.04* 1.30* 1.2*  CALCIUM 9.7 10.5* 10.2  --    Recent Labs    04/03/18 0810 08/07/18 0745 11/23/18 0000 01/04/19  AST _1 ALT _2 BILITOT 0.3 0.3 0.3  --   PROT 7.2 7.6 7.6  --    Recent Labs    08/07/18 0745 09/21/18 0815 11/23/18 0000 01/04/19 01/19/19  WBC 3.1* 2.9* 3.6*  --  3.6  NEUTROABS 1,494* 1,441* 2,048  --   --   HGB 10.5* 10.1* 10.7* 9.3* 8.9*  HCT 32.4* 30.6* 32.8* 29* 28*  MCV 86.9 86.0 85.4  --   --   PLT 79* 83* 88* 89* 74*   Lab Results  Component Value Date   TSH 2.90 11/23/2018   No results  found for: HGBA1C Lab Results  Component Value Date   CHOL 227 (H) 11/23/2018   HDL 60 11/23/2018   LDLCALC 141 (H) 11/23/2018   TRIG 140 11/23/2018   CHOLHDL 3.8 11/23/2018    Significant Diagnostic Results in last 30 days:  No results found.  Assessment/Plan 1. Essential hypertension B/p reviewed stable.continue on amlodipine 10 mg tablet daily,Hydralazine 25 mg tablet twice daily,torsemide 20 mg tablet daily and losartan 100 mg tablet daily.on potassium supplement.     2. Acquired hypothyroidism Lab Results  Component Value Date   TSH 2.90 11/23/2018  Continue on levothyroxine 75 mcg tablet daily.   3. Mixed hyperlipidemia Continue on Atorvastatin 5 mg tablet daily and Omega -3 fatty acid 1000 mg capsule daily.   4. Depression with anxiety Mood stable.continue on Wellbutrin 150 mg tablet twice daily,sertraline 25 mg tablet daily and Xanax 0.25 mg tablet at bedtime as needed.continue to monitor for mood changes.    5. Gastroesophageal reflux disease with esophagitis Sleeps upright on her recliner.continue on omeprazole 40 mg capsule  daily.   6. Chronic back pain, unspecified back location, unspecified back pain laterality Unable to sleep in her bed due to pain.continue on tramadol 50 mg tablet twice daily as needed and extra strength Tylenol 1000 mg twice daily.   7. Pancytopenia (HCC) Plts 74,WBC 3.6 (01/19/2019) 89;88 Has upcoming appointment with Hematologist July 30 th at 1 pm.   Family/ staff Communication: Reviewed plan of care with patient and facility Nurse.  Labs/tests ordered: None   Milagro Belmares C Philo Kurtz, NP

## 2019-02-21 ENCOUNTER — Telehealth: Payer: Self-pay | Admitting: Oncology

## 2019-02-22 ENCOUNTER — Inpatient Hospital Stay: Payer: Medicare Other | Attending: Oncology | Admitting: Oncology

## 2019-02-22 DIAGNOSIS — D696 Thrombocytopenia, unspecified: Secondary | ICD-10-CM | POA: Diagnosis not present

## 2019-02-22 NOTE — Progress Notes (Signed)
Hematology and Oncology Follow Up for Telemedicine Visits  [name] [name] [id number] [birth year] 85 y.o. 02/22/2019 12:42 PM Virgie Dad, MDGupta, Rene Kocher, MD   I connected with Ms. Pallas on 02/22/19 at  1:00 PM EDT by telephone visit and verified that I am speaking with the correct person using two identifiers.   I discussed the limitations, risks, security and privacy concerns of performing an evaluation and management service by telemedicine and the availability of in-person appointments. I also discussed with the patient that there may be a patient responsible charge related to this service. The patient expressed understanding and agreed to proceed.  Other persons participating in the visit and their role in the encounter: Her son Marya Amsler  Patient's location: Home Provider's location: Office    Principle Diagnosis: 85 year old woman with mild pancytopenia with predominantly thrombocytopenia diagnosed n 2013.    Current therapy: Active surveillance.   Interim History: I reached out to Ms. Glauber and her son Marya Amsler based on requests from her primary care physician regarding declining counts.  CBC obtained in April 2020 showed a white cell count of 3.6 and platelet count of 88.  Repeat CBC on 01/19/2019 showed a white cell count 3.6 platelet count of 74.  Her hemoglobin is currently at 8.9.  Clinically, she has been doing poorly and the majority of history was provided by her son.  She has been spending more time in chair and bed with very limited mobility.  He has also been reported to have mental decline.  She denied any recent bleeding complications occluding epistaxis, hematochezia or melena.     Medications: I have reviewed the patient's current medications.  Current Outpatient Medications  Medication Sig Dispense Refill  . acetaminophen (TYLENOL) 325 MG tablet Take 650 mg by mouth every 4 (four) hours as needed.    Marland Kitchen acetaminophen (TYLENOL) 500 MG tablet Take 1,000 mg by mouth 2 (two)  times a day.    . ALPRAZolam (XANAX) 0.25 MG tablet TAKE 1 TABLET BY MOUTH AT BEDTIME AS NEEDED FOR REST 30 tablet 0  . amLODipine (NORVASC) 10 MG tablet TAKE 1 TABLET BY MOUTH  DAILY 90 tablet 1  . atorvastatin (LIPITOR) 10 MG tablet Take 5 mg by mouth daily.     Marland Kitchen buPROPion (WELLBUTRIN SR) 150 MG 12 hr tablet Take 1 tablet (150 mg total) by mouth 2 (two) times daily after a meal. 180 tablet 3  . Calcium-Phosphorus-Vitamin D (CITRACAL +D3 PO) Take 1 capsule by mouth 2 (two) times a day. 612m-25mcg(1000u)    . Camphor-Menthol-Methyl Sal 3.07-31-08 % PTCH Place 1 patch onto the skin. Apply one to lower back daily for back pain    . docusate sodium (COLACE) 100 MG capsule Take 100 mg by mouth 2 (two) times daily.    . Fiber POWD Take 15 mLs by mouth daily. Mix with 4-8 oz of water    . fish oil-omega-3 fatty acids 1000 MG capsule Take 1 g by mouth daily.    . hydrALAZINE (APRESOLINE) 25 MG tablet Take 1 tablet (25 mg total) by mouth 2 (two) times daily. 180 tablet 3  . KLOR-CON M20 20 MEQ tablet TAKE 1 TABLET BY MOUTH TWICE A DAY 60 tablet 1  . levothyroxine (SYNTHROID, LEVOTHROID) 75 MCG tablet Take 1 tablet (75 mcg total) by mouth daily before breakfast. 90 tablet 1  . losartan (COZAAR) 100 MG tablet Take 1 tablet (100 mg total) by mouth daily. 90 tablet 1  . methocarbamol (ROBAXIN) 500  MG tablet Take 250 mg by mouth daily. For muscle spasms    . nitrofurantoin (MACRODANTIN) 50 MG capsule Take 1 capsule (50 mg total) by mouth daily. 90 capsule 3  . nystatin (MYCOSTATIN/NYSTOP) powder Apply 100,000 g topically 2 (two) times daily.    Marland Kitchen omeprazole (PRILOSEC) 40 MG capsule Take 40 mg by mouth daily.    . polyethylene glycol (MIRALAX / GLYCOLAX) 17 g packet Take 17 g by mouth daily. For constipation    . sertraline (ZOLOFT) 25 MG tablet Take 25 mg by mouth daily.    Marland Kitchen torsemide (DEMADEX) 20 MG tablet TAKE 1 TABLET BY MOUTH TWICE A DAY 90 tablet 1  . traMADol (ULTRAM) 50 MG tablet Take 50 mg by mouth  2 (two) times daily as needed.     No current facility-administered medications for this visit.      Allergies: No Known Allergies  Past Medical History, Surgical history, Social history, and Family History were reviewed and updated.      Lab Results: Lab Results  Component Value Date   WBC 3.6 01/19/2019   HGB 8.9 (A) 01/19/2019   HCT 28 (A) 01/19/2019   MCV 85.4 11/23/2018   PLT 74 (A) 01/19/2019     Chemistry      Component Value Date/Time   NA 138 01/04/2019   K 3.9 01/04/2019   CL 96 (L) 11/23/2018 0000   CL 97 01/23/2018   CO2 31 11/23/2018 0000   CO2 30 01/23/2018   BUN 28 (A) 01/04/2019   CREATININE 1.2 (A) 01/04/2019   CREATININE 1.30 (H) 11/23/2018 0000   GLU 91 01/04/2019      Component Value Date/Time   CALCIUM 10.2 11/23/2018 0000   CALCIUM 9.6 01/23/2018   ALKPHOS 84 01/23/2018   AST 21 01/04/2019   ALT 17 01/04/2019   BILITOT 0.3 11/23/2018 0000        Impression and Plan:   85 year old woman with:  1.  Mild pancytopenia that has been fluctuating since 2013.  She is relatively asymptomatic from these findings although it is difficult to assess given her overall functional decline.  The differential diagnosis was reviewed today with the patient and her son.  Early myelodysplastic syndrome is certainly a possibility.  Given her age and overall health decline she would not be a candidate for any aggressive measures or interventions.  I do not recommend a bone marrow biopsy at this time given the fact that no intervention will be needed.  I have recommended continued supportive management alone and consideration for hospice of her health continues to decline.  He is not having any bleeding issues or any specific symptoms that require intervention or is affecting her quality of life.   2.  Follow-up: I am happy to readdress this issue with her in the future.  I see no further need for any hematology evaluation.  I discussed the assessment and  treatment plan with the patient. The patient was provided an opportunity to ask questions and all were answered. The patient agreed with the plan and demonstrated an understanding of the instructions.   The patient was advised to call back or seek an in-person evaluation if the symptoms worsen or if the condition fails to improve as anticipated.  I provided 20 minutes of non face-to-face telephone visit time during this encounter, and > 50% was dedicated to reviewing her laboratory data, differential diagnosis and management options.   Zola Button, MD 02/22/2019 12:42 PM

## 2019-02-23 ENCOUNTER — Other Ambulatory Visit: Payer: Self-pay

## 2019-02-23 DIAGNOSIS — F418 Other specified anxiety disorders: Secondary | ICD-10-CM

## 2019-02-23 MED ORDER — TRAMADOL HCL 50 MG PO TABS: 50.0000 mg | ORAL_TABLET | Freq: Two times a day (BID) | ORAL | 0 refills | Status: DC | PRN

## 2019-02-23 MED ORDER — ALPRAZOLAM 0.25 MG PO TABS: ORAL_TABLET | ORAL | 0 refills | Status: DC

## 2019-02-27 ENCOUNTER — Other Ambulatory Visit: Payer: Self-pay | Admitting: *Deleted

## 2019-02-27 DIAGNOSIS — F418 Other specified anxiety disorders: Secondary | ICD-10-CM

## 2019-02-27 MED ORDER — ALPRAZOLAM 0.25 MG PO TABS: ORAL_TABLET | ORAL | 0 refills | Status: DC

## 2019-02-27 NOTE — Telephone Encounter (Signed)
Received fax order from [facility] and sent to Dr. Lyndel Safe for approval.

## 2019-03-02 ENCOUNTER — Other Ambulatory Visit: Payer: Self-pay

## 2019-03-02 DIAGNOSIS — F418 Other specified anxiety disorders: Secondary | ICD-10-CM

## 2019-03-02 MED ORDER — ALPRAZOLAM 0.25 MG PO TABS: ORAL_TABLET | ORAL | 0 refills | Status: DC

## 2019-03-02 NOTE — Telephone Encounter (Signed)
[facility] patient requesting refill.

## 2019-03-30 ENCOUNTER — Other Ambulatory Visit: Payer: Self-pay

## 2019-03-30 MED ORDER — ALPRAZOLAM 0.25 MG PO TABS: 0.2500 mg | ORAL_TABLET | Freq: Every day | ORAL | 2 refills | Status: AC

## 2019-04-30 ENCOUNTER — Other Ambulatory Visit: Payer: Self-pay | Admitting: *Deleted

## 2019-04-30 MED ORDER — ALPRAZOLAM 0.25 MG PO TABS: 0.2500 mg | ORAL_TABLET | Freq: Every day | ORAL | 0 refills | Status: DC

## 2019-04-30 NOTE — Telephone Encounter (Signed)
Received fax refill Request from [facility] °Pended Rx and sent to Dr. Gupta for approval.  °

## 2019-05-07 ENCOUNTER — Non-Acute Institutional Stay: Payer: Medicare Other | Admitting: Internal Medicine

## 2019-05-07 ENCOUNTER — Encounter: Payer: Self-pay | Admitting: Internal Medicine

## 2019-05-07 ENCOUNTER — Other Ambulatory Visit: Payer: Self-pay | Admitting: Internal Medicine

## 2019-05-07 DIAGNOSIS — D61818 Other pancytopenia: Secondary | ICD-10-CM

## 2019-05-07 DIAGNOSIS — M545 Low back pain, unspecified: Secondary | ICD-10-CM

## 2019-05-07 DIAGNOSIS — I10 Essential (primary) hypertension: Secondary | ICD-10-CM

## 2019-05-07 DIAGNOSIS — E782 Mixed hyperlipidemia: Secondary | ICD-10-CM | POA: Diagnosis not present

## 2019-05-07 DIAGNOSIS — M549 Dorsalgia, unspecified: Secondary | ICD-10-CM

## 2019-05-07 DIAGNOSIS — F418 Other specified anxiety disorders: Secondary | ICD-10-CM | POA: Diagnosis not present

## 2019-05-07 DIAGNOSIS — E039 Hypothyroidism, unspecified: Secondary | ICD-10-CM

## 2019-05-07 DIAGNOSIS — G8929 Other chronic pain: Secondary | ICD-10-CM

## 2019-05-07 MED ORDER — TRAMADOL HCL 50 MG PO TABS: 75.0000 mg | ORAL_TABLET | Freq: Two times a day (BID) | ORAL | 0 refills | Status: DC | PRN

## 2019-05-07 NOTE — Progress Notes (Signed)
Location:    [facility] Home Room Number: 94 Place of Service:  ALF [phone]) Provider:  Virgie Dad, MD  Virgie Dad, MD  Patient Care Team: Virgie Dad, MD as PCP - General (Internal Medicine) Mast, Man X, NP as Nurse Practitioner (Internal Medicine)  Extended Emergency Contact Information Primary Emergency Contact: Locklin,[name] Address: [address]          [place], [place] [address] [place] Phone: [phone] Mobile Phone: [phone] Relation: Son Secondary Emergency Contact: Tardif,[name]  [place] Phone: [phone] Relation: None  Code Status:  DNR Goals of care: Advanced Directive information Advanced Directives 05/07/2019  Does Patient Have a Medical Advance Directive? Yes  Type of Advance Directive -  Does patient want to make changes to medical advance directive? -  Copy of [place] in Chart? -  Pre-existing out of facility DNR order (yellow form or pink MOST form) -     Chief Complaint  Patient presents with  . Acute Visit    Pain management    HPI:  Pt is a 85 y.o. female seen today for an acute visit for Pain Management  Patient has h/o Chronic Back Pain, Hypertension, Hypothyroidism, Hypertension, Recurrent Cystitis, Osteoporosis and Major Depression. And Pancytopenia Patient was seen today as nurses that she was very upset with the back pain.  Patient has a history of chronic back pain.  She is mostly wheelchair dependent.  Walks with a walker for very short distance. She also has a history of depression with anxiety. She is on tramadol 50 mg twice daily.  She is also Robaxin as needed.  We tried to schedule her medications but then she refuses.  She also refuses for oxycodone and Norco. With today she kept complaining that she has had back pain and. It never goes away Her weight is staying stable.  Her appetite is is good.  No nursing issues  Past Medical History:  Diagnosis Date  .  Anemia   . Anxiety   . Chronotropic incompetence 12/2012    potentially medication related; noted on CPET   . DDD (degenerative disc disease)   . Eczema   . GERD (gastroesophageal reflux disease)   . H/O hiatal hernia   . Hx: UTI (urinary tract infection)   . Hyperlipidemia   . Hypertension   . Hypothyroidism   . Osteopenia   . Septicemia ([place]) 2002   following UTI  . Vertigo, benign positional    Past Surgical History:  Procedure Laterality Date  . BREAST SURGERY     left biopsy  . CATARACT EXTRACTION Right    2 weeks ago  . CPET / MET - PFTS     Consistent with chronotropic incompetence; See attached report in the results section  . DILATION AND CURETTAGE OF UTERUS     x 2  . DOPPLER ECHOCARDIOGRAPHY  01/10/2013   Normal LV size and function. Normal EF. Air sclerosis but no stenosis  . ESOPHAGOGASTRODUODENOSCOPY  05/04/2012   Procedure: ESOPHAGOGASTRODUODENOSCOPY (EGD);  Surgeon: Inda Castle, MD;  Location: [facility];  Service: Endoscopy;  Laterality: N/A;  . EYE SURGERY     cataract extraction with ILO  ? eye  . IR KYPHO EA ADDL LEVEL THORACIC OR LUMBAR  10/28/2016  . IR KYPHO LUMBAR INC FX REDUCE BONE BX UNI/BIL CANNULATION INC/IMAGING  10/28/2016  . IR KYPHO THORACIC WITH BONE BIOPSY  12/24/2016  . IR RADIOLOGIST EVAL & MGMT  11/11/2016  . KNEE ARTHROSCOPY  2011   right  . TONSILLECTOMY    . TOTAL KNEE ARTHROPLASTY  04/24/2012   Procedure: TOTAL KNEE ARTHROPLASTY;  Surgeon: Gearlean Alf, MD;  Location: [facility];  Service: Orthopedics;  Laterality: Right;  . TRANSTHORACIC ECHOCARDIOGRAM  02/2018   Ordered by PCP for "congestive heart failure " -EF 60 M 65%.  GR 1 DD.  No R WMA--- > ESSENTIALLY NORMAL    No Known Allergies  Allergies as of 05/07/2019   No Known Allergies     Medication List       Accurate as of May 07, 2019 11:25 AM. If you have any questions, ask your nurse or doctor.        acetaminophen 500 MG tablet Commonly known as: TYLENOL  Take 500 mg by mouth 2 (two) times a day. What changed: Another medication with the same name was removed. Continue taking this medication, and follow the directions you see here. Changed by: Virgie Dad, MD   ALPRAZolam 0.25 MG tablet Commonly known as: XANAX Take 1 tablet (0.25 mg total) by mouth at bedtime.   amLODipine 10 MG tablet Commonly known as: NORVASC TAKE 1 TABLET BY MOUTH  DAILY   atorvastatin 10 MG tablet Commonly known as: LIPITOR Take 5 mg by mouth daily.   buPROPion 150 MG 12 hr tablet Commonly known as: WELLBUTRIN SR Take 1 tablet (150 mg total) by mouth 2 (two) times daily after a meal.   Camphor-Menthol-Methyl Sal 3.07-31-08 % Ptch Place 1 patch onto the skin. Apply one to lower back daily for back pain   CITRACAL +D3 PO Take 2 capsules by mouth 2 (two) times a day. 611m-25mcg(1000u)   docusate sodium 100 MG capsule Commonly known as: COLACE Take 100 mg by mouth 2 (two) times daily.   Fiber Powd Take 15 mLs by mouth daily. Mix with 4-8 oz of water   fish oil-omega-3 fatty acids 1000 MG capsule Take 1 g by mouth daily.   hydrALAZINE 25 MG tablet Commonly known as: APRESOLINE Take 1 tablet (25 mg total) by mouth 2 (two) times daily.   Klor-Con M20 20 MEQ tablet Generic drug: potassium chloride SA TAKE 1 TABLET BY MOUTH TWICE A DAY   levothyroxine 75 MCG tablet Commonly known as: SYNTHROID Take 1 tablet (75 mcg total) by mouth daily before breakfast.   losartan 100 MG tablet Commonly known as: COZAAR Take 1 tablet (100 mg total) by mouth daily.   methocarbamol 500 MG tablet Commonly known as: ROBAXIN Take 250 mg by mouth daily. For muscle spasms   nitrofurantoin 50 MG capsule Commonly known as: MACRODANTIN Take 1 capsule (50 mg total) by mouth daily.   nystatin powder Commonly known as: MYCOSTATIN/NYSTOP Apply 100,000 g topically 2 (two) times daily.   omeprazole 40 MG capsule Commonly known as: PRILOSEC Take 40 mg by mouth daily.    polyethylene glycol 17 g packet Commonly known as: MIRALAX / GLYCOLAX Take 17 g by mouth daily. For constipation   sertraline 25 MG tablet Commonly known as: ZOLOFT Take 25 mg by mouth daily.   torsemide 20 MG tablet Commonly known as: DEMADEX TAKE 1 TABLET BY MOUTH TWICE A DAY   traMADol 50 MG tablet Commonly known as: ULTRAM Take 1 tablet (50 mg total) by mouth 2 (two) times daily as needed.       Review of Systems  Constitutional: Positive for activity change.  HENT: Negative.   Respiratory: Negative.  Cardiovascular: Positive for leg swelling.  Gastrointestinal: Positive for nausea.  Genitourinary: Negative.   Musculoskeletal: Positive for arthralgias, back pain, joint swelling and myalgias.  Skin: Negative.   Neurological: Positive for weakness.  Psychiatric/Behavioral: Positive for dysphoric mood.  All other systems reviewed and are negative.   Immunization History  Administered Date(s) Administered  . Influenza, High Dose Seasonal PF 04/26/2016, 03/22/2017  . Influenza,inj,Quad PF,6+ Mos 04/27/2018  . Influenza-Unspecified 04/22/2011, 04/04/2012, 05/03/2013, 05/04/2013, 04/17/2014, 04/19/2015, 04/26/2016, 04/22/2017  . Pneumococcal Polysaccharide-23 08/26/2003, 09/06/2011  . Pneumococcal-Unspecified 12/20/2013  . Td 09/26/2006  . Zoster 10/25/2007  . Zoster Recombinat (Shingrix) 11/21/2017, 04/08/2018   Pertinent  Health Maintenance Due  Topic Date Due  . PNA vac Low Risk Adult (2 of 2 - PCV13) 12/21/2014  . INFLUENZA VACCINE  02/24/2019  . DEXA SCAN  Completed   Fall Risk  10/18/2018 08/23/2018 05/09/2018 05/01/2018 04/10/2018  Falls in the past year? 0 0 Yes Yes No  Comment - - - [facility] -  Number falls in past yr: 0 0 1 1 -  Injury with Fall? 0 0 No No -  Risk for fall due to : - - - Impaired balance/gait -  Follow up - - - Falls prevention discussed -   Functional Status Survey:    Vitals:   05/07/19 1115   BP: 107/60  Pulse: 72  Resp: 18  Temp: 97.8 F (36.6 C)  SpO2: 94%  Weight: 176 lb 12.8 oz (80.2 kg)  Height: '5\' 4"'  (1.626 m)   Body mass index is 30.35 kg/m. Physical Exam Vitals signs reviewed.  Constitutional:      Appearance: Normal appearance.  HENT:     Head: Normocephalic.     Nose: Nose normal.     Mouth/Throat:     Mouth: Mucous membranes are moist.     Pharynx: Oropharynx is clear.  Eyes:     Pupils: Pupils are equal, round, and reactive to light.  Neck:     Musculoskeletal: Neck supple.  Cardiovascular:     Rate and Rhythm: Normal rate.     Pulses: Normal pulses.  Pulmonary:     Effort: Pulmonary effort is normal.     Breath sounds: Normal breath sounds.  Abdominal:     General: Abdomen is flat. Bowel sounds are normal.     Palpations: Abdomen is soft.  Musculoskeletal:        General: Swelling present.  Skin:    General: Skin is warm and dry.  Neurological:     General: No focal deficit present.     Mental Status: She is alert and oriented to person, place, and time.  Psychiatric:        Attention and Perception: Attention normal.        Mood and Affect: Mood is anxious and depressed.        Speech: Speech normal.     Labs reviewed: Recent Labs    08/07/18 0745 11/23/18 0000 01/04/19  NA 139 134* 138  K 4.2 4.1 3.9  CL 100 96*  --   CO2 26 31  --   GLUCOSE 90 88  --   BUN 25 27* 28*  CREATININE 1.04* 1.30* 1.2*  CALCIUM 10.5* 10.2  --    Recent Labs    08/07/18 0745 11/23/18 0000 01/04/19  AST '30 29 21  ' ALT '22 22 17  ' BILITOT 0.3 0.3  --   PROT 7.6 7.6  --  Recent Labs    08/07/18 0745 09/21/18 0815 11/23/18 0000 01/04/19 01/19/19  WBC 3.1* 2.9* 3.6*  --  3.6  NEUTROABS 1,494* 1,441* 2,048  --   --   HGB 10.5* 10.1* 10.7* 9.3* 8.9*  HCT 32.4* 30.6* 32.8* 29* 28*  MCV 86.9 86.0 85.4  --   --   PLT 79* 83* 88* 89* 74*   Lab Results  Component Value Date   TSH 2.90 11/23/2018   No results found for: HGBA1C Lab Results   Component Value Date   CHOL 227 (H) 11/23/2018   HDL 60 11/23/2018   LDLCALC 141 (H) 11/23/2018   TRIG 140 11/23/2018   CHOLHDL 3.8 11/23/2018    Significant Diagnostic Results in last 30 days:  No results found.  Assessment/Plan  Chronic bilateral low back pain without sciatica Increase Ultram to 75 mg BID Continue tylenol prn Refusing for Norco Right now Also on Robaxin Also started her on Neurontin 100 mg TID to see if it helps with her pain  Depression with anxiety On Wellbutrin and Xanax and Zoloft  Mixed hyperlipidemia Repeat Lipid panel On Lipitor  Essential hypertension On Norvasc and Hydralazine and Cozaar  Acquired hypothyroidism TSH normal in 4/20 Pancytopenia ([place]) No Further Work up by hematology AS per Patient request Repeat CBC Age-related osteoporosis No Dexa scan report  Was on Tymlos per patient  Will repeat DEXA scan LE edema Doing good on Demadex Recurrent Cystitis Continue On Nitrofurantoin      Family/ staff Communication:   Labs/tests ordered:  CBC,CMP and Lipid Panel Follow up in few weeks for BP,? Decrease the dose of Norvasc. Labs, DEXA scan, ? MRI for Back

## 2019-05-08 ENCOUNTER — Encounter: Payer: Self-pay | Admitting: Nurse Practitioner

## 2019-05-08 NOTE — Progress Notes (Signed)
This encounter was created in error - please disregard.

## 2019-05-11 ENCOUNTER — Encounter: Payer: Self-pay | Admitting: Nurse Practitioner

## 2019-05-11 DIAGNOSIS — N183 Chronic kidney disease, stage 3 unspecified: Secondary | ICD-10-CM | POA: Insufficient documentation

## 2019-05-15 ENCOUNTER — Non-Acute Institutional Stay: Payer: Medicare Other | Admitting: Nurse Practitioner

## 2019-05-15 ENCOUNTER — Encounter: Payer: Self-pay | Admitting: Nurse Practitioner

## 2019-05-15 DIAGNOSIS — R0609 Other forms of dyspnea: Secondary | ICD-10-CM

## 2019-05-15 DIAGNOSIS — E039 Hypothyroidism, unspecified: Secondary | ICD-10-CM

## 2019-05-15 DIAGNOSIS — D638 Anemia in other chronic diseases classified elsewhere: Secondary | ICD-10-CM

## 2019-05-15 DIAGNOSIS — I10 Essential (primary) hypertension: Secondary | ICD-10-CM | POA: Diagnosis not present

## 2019-05-15 DIAGNOSIS — R0600 Dyspnea, unspecified: Secondary | ICD-10-CM

## 2019-05-15 DIAGNOSIS — K219 Gastro-esophageal reflux disease without esophagitis: Secondary | ICD-10-CM

## 2019-05-15 DIAGNOSIS — S32020A Wedge compression fracture of second lumbar vertebra, initial encounter for closed fracture: Secondary | ICD-10-CM

## 2019-05-15 DIAGNOSIS — F321 Major depressive disorder, single episode, moderate: Secondary | ICD-10-CM

## 2019-05-15 DIAGNOSIS — I739 Peripheral vascular disease, unspecified: Secondary | ICD-10-CM

## 2019-05-15 DIAGNOSIS — K5909 Other constipation: Secondary | ICD-10-CM

## 2019-05-15 DIAGNOSIS — N183 Chronic kidney disease, stage 3 unspecified: Secondary | ICD-10-CM

## 2019-05-15 NOTE — Assessment & Plan Note (Signed)
Stable, continue Colace 100mg bid, MiraLax qd.  

## 2019-05-15 NOTE — Assessment & Plan Note (Signed)
Chronic, continue Torsemide 20mg  bid.

## 2019-05-15 NOTE — Assessment & Plan Note (Signed)
chronic lower back pain, positional, localized, denied abd pain or dysuria, continue Tylenol 500mg  bid, Gabapentin 100mg  tid, Methocarbamol 250mg  qd, and prn Tramadol 75mg  q12hr.

## 2019-05-15 NOTE — Assessment & Plan Note (Signed)
mood is stable, continue Sertraline 25mg  qd, Wellbutrin 150mg  bid, Alprazolam 0.25mg  qhs.

## 2019-05-15 NOTE — Assessment & Plan Note (Signed)
Stable, continue Levothyroxine 12mcg qd, last TSH 2.9 10/2018

## 2019-05-15 NOTE — Assessment & Plan Note (Signed)
eGFR 42 05/10/19

## 2019-05-15 NOTE — Assessment & Plan Note (Signed)
Stable, continue Omeprazole 40mg qd.  

## 2019-05-15 NOTE — Assessment & Plan Note (Signed)
Chronic, last Hgb 9.0 10//15/20, no active bleed.

## 2019-05-15 NOTE — Progress Notes (Addendum)
Location:   [facility] Room Number: 31 Place of Service:  ALF (13) Provider:  ,  NP  Virgie Dad, MD  Patient Care Team: Virgie Dad, MD as PCP - General (Internal Medicine) ,  X, NP as Nurse Practitioner (Internal Medicine)  Extended Emergency Contact Information Primary Emergency Contact: Runner,[name] Address: [address]          [place], [place] [address] [place] Phone: [phone] Mobile Phone: [phone] Relation: Son Secondary Emergency Contact: Kerce,[name]  [place] Phone: [phone] Relation: None  Code Status:  DNR Goals of care: Advanced Directive information Advanced Directives 05/15/2019  Does Patient Have a Medical Advance Directive? Yes  Type of Advance Directive Out of facility DNR (pink MOST or yellow form)  Does patient want to make changes to medical advance directive? No - Patient declined  Copy of [place] in Chart? -  Pre-existing out of facility DNR order (yellow form or pink MOST form) Yellow form placed in chart (order not valid for inpatient use)     Chief Complaint  Patient presents with  . Medical agement of Chronic Issues  . Health Maintenance    PNA, TDAP, influenza vaccine    HPI:  Pt is a 85 y.o. female seen today for medical management of chronic diseases.    The patient resides in [facility], ambulates with walker, chronic lower back pain, positional, localized, denied abd pain or dysuria, taking Tylenol 540m bid, Gabapentin 1021mtid, Methocarbamol 25037md, and prn Tramadol 1m23m2hr. C/o DOE, not new, denied cough, phlegm production, chest pain, or palpitation. Chronic edema BLE, no open wounds, on Torsemide 20mg49m.  Her mood is stable, on Sertraline 25mg 44mWellbutrin 150mg b40mAlprazolam 0.25mg qh2mTN, blood pressure is controlled, on Amlodipine 10mg qd,64mralazine 25mg bid,36martan 100mg qd. C48mipation, stable, on Colace 100mg bid,  76mLax qd. GERD, stable, on Omeprazole 40mg qd. Hyp2mroidism, on Levothyroxine 1mcg qd, TSH85m0 11/23/18. Anemia, Hgb 9.0, CKD eGFR 42 05/10/19.    Past Medical History:  Diagnosis Date  . Anemia   . Anxiety   . Chronotropic incompetence 12/2012    potentially medication related; noted on CPET   . DDD (degenerative disc disease)   . Eczema   . GERD (gastroesophageal reflux disease)   . H/O hiatal hernia   . Hx: UTI (urinary tract infection)   . Hyperlipidemia   . Hypertension   . Hypothyroidism   . Osteopenia   . Septicemia ([facility]) 2002   [place] UTI  . Vertigo, benign positional    Past Surgical History:  Procedure Laterality Date  . BREAST SURGERY     left biopsy  . CATARACT EXTRACTION Right    2 weeks ago  . CPET / MET - PFTS     Consistent with chronotropic incompetence; See attached report in the results section  . DILATION AND CURETTAGE OF UTERUS     x 2  . DOPPLER ECHOCARDIOGRAPHY  01/10/2013   Normal LV size and function. Normal EF. Air sclerosis but no stenosis  . ESOPHAGOGASTRODUODENOSCOPY  05/04/2012   Procedure: ESOPHAGOGASTRODUODENOSCOPY (EGD);  Surgeon: Robert D KaplaInda Castlen: [facility]; [facility];  Laterality: N/A;  . EYE SURGERY     cataract extraction with ILO  ? eye  . IR KYPHO EA ADDL LEVEL THORACIC OR LUMBAR  10/28/2016  . IR KYPHO LUMBAR INC FX REDUCE BONE BX UNI/BIL CANNULATION INC/IMAGING  10/28/2016  . IR KYPHO THORACIC WITH BONE BIOPSY  12/24/2016  . IR RADIOLOGIST EVAL & MGMT  11/11/2016  . KNEE ARTHROSCOPY  2011   right  . TONSILLECTOMY    . TOTAL KNEE ARTHROPLASTY  04/24/2012   Procedure: TOTAL KNEE ARTHROPLASTY;  Surgeon: Gearlean Alf, MD;  Location: [facility];  Service: Orthopedics;  Laterality: Right;  . TRANSTHORACIC ECHOCARDIOGRAM  02/2018   Ordered by PCP for "congestive heart failure " -EF 60 M 65%.  GR 1 DD.  No R WMA--- > ESSENTIALLY NORMAL    No Known Allergies  Allergies as of 05/15/2019   No Known Allergies      Medication List       Accurate as of May 15, 2019  1:04 PM. If you have any questions, ask your nurse or doctor.        acetaminophen 500 MG tablet Commonly known as: TYLENOL Take by mouth 2 (two) times a day. [id number] mg   ALPRAZolam 0.25 MG tablet Commonly known as: XANAX Take 1 tablet (0.25 mg total) by mouth at bedtime.   amLODipine 10 MG tablet Commonly known as: NORVASC TAKE 1 TABLET BY MOUTH  DAILY   atorvastatin 10 MG tablet Commonly known as: LIPITOR Take 5 mg by mouth daily.   buPROPion 150 MG 12 hr tablet Commonly known as: WELLBUTRIN SR Take 1 tablet (150 mg total) by mouth 2 (two) times daily after a meal.   Camphor-Menthol-Methyl Sal 3.07-31-08 % Ptch Place 1 patch onto the skin. Apply one to lower back daily for back pain   CITRACAL +D3 PO Take 2 capsules by mouth 2 (two) times a day. 623m-25mcg(1000u)   docusate sodium 100 MG capsule Commonly known as: COLACE Take 100 mg by mouth 2 (two) times daily.   Fiber Powd Take 15 mLs by mouth daily. Mix with 4-8 oz of water   fish oil-omega-3 fatty acids 1000 MG capsule Take 1 g by mouth daily.   gabapentin 100 MG capsule Commonly known as: NEURONTIN Take 100 mg by mouth 3 (three) times daily.   hydrALAZINE 25 MG tablet Commonly known as: APRESOLINE Take 1 tablet (25 mg total) by mouth 2 (two) times daily.   Klor-Con M20 20 MEQ tablet Generic drug: potassium chloride SA TAKE 1 TABLET BY MOUTH TWICE A DAY   levothyroxine 75 MCG tablet Commonly known as: SYNTHROID Take 1 tablet (75 mcg total) by mouth daily before breakfast.   losartan 100 MG tablet Commonly known as: COZAAR Take 1 tablet (100 mg total) by mouth daily.   methocarbamol 500 MG tablet Commonly known as: ROBAXIN Take 250 mg by mouth daily. For muscle spasms   nitrofurantoin 50 MG capsule Commonly known as: MACRODANTIN Take 1 capsule (50 mg total) by mouth daily.   nystatin powder Commonly known as: MYCOSTATIN/NYSTOP  Apply 100,000 g topically 2 (two) times daily.   omeprazole 40 MG capsule Commonly known as: PRILOSEC Take 40 mg by mouth daily.   polyethylene glycol 17 g packet Commonly known as: MIRALAX / GLYCOLAX Take 17 g by mouth daily. For constipation   sertraline 25 MG tablet Commonly known as: ZOLOFT Take 25 mg by mouth daily.   torsemide 20 MG tablet Commonly known as: DEMADEX TAKE 1 TABLET BY MOUTH TWICE A DAY   traMADol 50 MG tablet Commonly known as: ULTRAM Take 1.5 tablets (75 mg total) by mouth every 12 (twelve) hours as needed.      ROS was provided with assistance of staff  Review of Systems  Constitutional: Negative for activity change, appetite change, chills, diaphoresis, fatigue, fever and unexpected weight change.  HENT: Positive for hearing loss. Negative for congestion and voice change.   Respiratory: Positive for shortness of breath. Negative for cough and wheezing.        DOE  Cardiovascular: Positive for leg swelling. Negative for chest pain and palpitations.  Gastrointestinal: Negative for abdominal distention, abdominal pain, constipation, diarrhea, nausea and vomiting.       Occasional heartburns.   Genitourinary: Negative for difficulty urinating, dysuria and urgency.       Urination 1-2x/night.   Musculoskeletal: Positive for arthralgias, back pain and gait problem.  Skin: Negative for color change.  Neurological: Positive for numbness. Negative for dizziness, weakness and headaches.       Memory lapses. Occasionally numbness in BLE  Psychiatric/Behavioral: Negative for agitation, behavioral problems, hallucinations and sleep disturbance. The patient is not nervous/anxious.     Immunization History  Administered Date(s) Administered  . Influenza, High Dose Seasonal PF 04/26/2016, 03/22/2017  . Influenza,inj,Quad PF,6+ Mos 04/27/2018  . Influenza-Unspecified 04/22/2011, 04/04/2012, 05/03/2013, 05/04/2013, 04/17/2014, 04/19/2015, 04/26/2016, 04/22/2017  .  Pneumococcal Polysaccharide-23 08/26/2003, 09/06/2011  . Pneumococcal-Unspecified 12/20/2013  . Td 09/26/2006  . Zoster 10/25/2007  . Zoster Recombinat (Shingrix) 11/21/2017, 04/08/2018   Pertinent  Health Maintenance Due  Topic Date Due  . PNA vac Low Risk Adult (2 of 2 - PCV13) 12/21/2014  . INFLUENZA VACCINE  02/24/2019  . DEXA SCAN  Completed   Fall Risk  10/18/2018 08/23/2018 05/09/2018 05/01/2018 04/10/2018  Falls in the past year? 0 0 Yes Yes No  Comment - - - fell in June/2019 _0  Home (asst living) -  Number falls in past yr: 0 0 1 1 -  Injury with Fall? 0 0 No No -  Risk for fall due to : - - - Impaired balance/gait -  Follow up - - - Falls prevention discussed -   Functional Status Survey:    Vitals:   05/15/19 1028  BP: 129/66  Pulse: 75  Resp: 18  Temp: (!) 97.2 F (36.2 C)  SpO2: 94%  Weight: 176 lb 12.8 oz (80.2 kg)  Height: 5' 4" (1.626 m)   Body mass index is 30.35 kg/m. Physical Exam Vitals signs and nursing note reviewed.  Constitutional:      General: She is not in acute distress.    Appearance: Normal appearance. She is obese. She is not ill-appearing, toxic-appearing or diaphoretic.  HENT:     Head: Normocephalic.     Nose: Nose normal.     Mouth/Throat:     Mouth: Mucous membranes are moist.  Eyes:     Extraocular Movements: Extraocular movements intact.     Conjunctiva/sclera: Conjunctivae normal.     Pupils: Pupils are equal, round, and reactive to light.  Neck:     Musculoskeletal: Normal range of motion and neck supple.  Cardiovascular:     Rate and Rhythm: Normal rate and regular rhythm.     Heart sounds: No murmur.  Pulmonary:     Breath sounds: No wheezing, rhonchi or rales.  Abdominal:     General: Bowel sounds are normal. There is no distension.     Palpations: Abdomen is soft.     Tenderness: There is no abdominal tenderness. There is no right CVA tenderness, left CVA tenderness, guarding or rebound.  Musculoskeletal:      Right lower leg: Edema present.     Left lower leg: Edema  present.     Comments: 1+ edema BLE  Skin:    General: Skin is warm and dry.  Neurological:     General: No focal deficit present.     Mental Status: She is alert. Mental status is at baseline.     Cranial Nerves: No cranial nerve deficit.     Motor: No weakness.     Coordination: Coordination normal.     Gait: Gait abnormal.     Comments: Oriented to person, place.   Psychiatric:        Mood and Affect: Mood normal.        Behavior: Behavior normal.        Thought Content: Thought content normal.        Judgment: Judgment normal.     Labs reviewed: Recent Labs    08/07/18 0745 11/23/18 0000 01/04/19  NA 139 134* 138  K 4.2 4.1 3.9  CL 100 96*  --   CO2 26 31  --   GLUCOSE 90 88  --   BUN 25 27* 28*  CREATININE 1.04* 1.30* 1.2*  CALCIUM 10.5* 10.2  --    Recent Labs    08/07/18 0745 11/23/18 0000 01/04/19  AST _0 ALT _1 BILITOT 0.3 0.3  --   PROT 7.6 7.6  --    Recent Labs    08/07/18 0745 09/21/18 0815 11/23/18 0000 01/04/19 01/19/19  WBC 3.1* 2.9* 3.6*  --  3.6  NEUTROABS 1,494* 1,441* 2,048  --   --   HGB 10.5* 10.1* 10.7* 9.3* 8.9*  HCT 32.4* 30.6* 32.8* 29* 28*  MCV 86.9 86.0 85.4  --   --   PLT 79* 83* 88* 89* 74*   Lab Results  Component Value Date   TSH 2.90 11/23/2018   No results found for: HGBA1C Lab Results  Component Value Date   CHOL 227 (H) 11/23/2018   HDL 60 11/23/2018   LDLCALC 141 (H) 11/23/2018   TRIG 140 11/23/2018   CHOLHDL 3.8 11/23/2018    Significant Diagnostic Results in last 30 days:  No results found.  Assessment/Plan Exertional dyspnea Chronic, remains no change, the declined CXR today. CT chest 12/2018 showed no acute pulmonary process. Will obtain echocardiogram.   CKD (chronic kidney disease) stage 3, GFR 30-59 ml/min eGFR 42 05/10/19  PVD (peripheral vascular disease) (HCC) Chronic, continue Torsemide 16m bid.   Essential  hypertension Blood pressure is controlled, continue Amlodipine 19mqd, Hydralazine 2577mid, Losartan 100m26m.  GERD (gastroesophageal reflux disease) Stable, continue Omeprazole 40mg79m   Chronic constipation Stable, continue Colace 100mg 34m MiraLax qd.  Acquired hypothyroidism Stable, continue Levothyroxine 75mcg 44mlast TSH 2.9 10/2018  Compression fracture of L2 lumbar vertebra, closed, initial encounter (HCC) ch[place]ic lower back pain, positional, localized, denied abd pain or dysuria, continue Tylenol 500mg bi52mabapentin 100mg tid66mthocarbamol 250mg qd, 32mprn Tramadol 75mg q12hr33mepression, major, single episode, moderate (HCC)  mood is stable, continue Sertraline 25mg qd, We22mtrin 150mg bid, Al62molam 0.25mg qhs.  An11m of chronic disease Chronic, last Hgb 9.0 10//15/20, no active bleed.      Family/ staff Communication:plan of care reviewed with the patient and charge nurse.  PNA 23, Tdap prescription provided.   Labs/tests ordered:   Echocardiogram  Time spend 40 minutes.

## 2019-05-15 NOTE — Assessment & Plan Note (Signed)
Blood pressure is controlled, continue Amlodipine 10mg  qd, Hydralazine 25mg  bid, Losartan 100mg  qd.

## 2019-05-15 NOTE — Assessment & Plan Note (Signed)
Chronic, remains no change, the declined CXR today. CT chest 12/2018 showed no acute pulmonary process. Will obtain echocardiogram.

## 2019-06-01 ENCOUNTER — Other Ambulatory Visit: Payer: Self-pay | Admitting: *Deleted

## 2019-06-01 MED ORDER — ALPRAZOLAM 0.25 MG PO TABS: 0.2500 mg | ORAL_TABLET | Freq: Every day | ORAL | 0 refills | Status: DC

## 2019-06-01 MED ORDER — TRAMADOL HCL 50 MG PO TABS: ORAL_TABLET | ORAL | 0 refills | Status: DC

## 2019-06-01 NOTE — Telephone Encounter (Signed)
Received fax refill request from [facility] Pended Rx's and sent to Dr. Lyndel Safe for approval.

## 2019-06-04 ENCOUNTER — Encounter: Payer: Self-pay | Admitting: Internal Medicine

## 2019-06-04 ENCOUNTER — Non-Acute Institutional Stay: Payer: Medicare Other | Admitting: Internal Medicine

## 2019-06-04 DIAGNOSIS — I10 Essential (primary) hypertension: Secondary | ICD-10-CM

## 2019-06-04 DIAGNOSIS — E039 Hypothyroidism, unspecified: Secondary | ICD-10-CM

## 2019-06-04 DIAGNOSIS — D61818 Other pancytopenia: Secondary | ICD-10-CM

## 2019-06-04 DIAGNOSIS — D638 Anemia in other chronic diseases classified elsewhere: Secondary | ICD-10-CM | POA: Diagnosis not present

## 2019-06-04 DIAGNOSIS — K5909 Other constipation: Secondary | ICD-10-CM

## 2019-06-04 DIAGNOSIS — G8929 Other chronic pain: Secondary | ICD-10-CM

## 2019-06-04 DIAGNOSIS — M549 Dorsalgia, unspecified: Secondary | ICD-10-CM

## 2019-06-04 NOTE — Progress Notes (Signed)
Location: [place] Room Number: 39 Place of Service:  ALF [phone])  Provider: Veleta Miners MD  Code Status: DNR Goals of Care:  Advanced Directives 05/15/2019  Does Patient Have a Medical Advance Directive? Yes  Type of Advance Directive Out of facility DNR (pink MOST or yellow form)  Does patient want to make changes to medical advance directive? No - Patient declined  Copy of [place] in Chart? -  Pre-existing out of facility DNR order (yellow form or pink MOST form) Yellow form placed in chart (order not valid for inpatient use)     Chief Complaint  Patient presents with  . Acute Visit    Low blood pressure    HPI: Patient is a 85 y.o. female seen today for an acute visit for Low BP readings  Patient has h/o Chronic Back Pain, Hypertension, Hypothyroidism, Hypertension, Recurrent Cystitis, Osteoporosis and Major Depression. And Pancytopenia  Per Nurses patient has been having some SBP clse to 100. She denies any Dizziness or Chest Pain. No Other symptoms Doing well other wise. C/O Some Constipation  Past Medical History:  Diagnosis Date  . Anemia   . Anxiety   . Chronotropic incompetence 12/2012    potentially medication related; noted on CPET   . DDD (degenerative disc disease)   . Eczema   . GERD (gastroesophageal reflux disease)   . H/O hiatal hernia   . Hx: UTI (urinary tract infection)   . Hyperlipidemia   . Hypertension   . Hypothyroidism   . Osteopenia   . Septicemia ([place]) 2002   following UTI  . Vertigo, benign positional     Past Surgical History:  Procedure Laterality Date  . BREAST SURGERY     left biopsy  . CATARACT EXTRACTION Right    2 weeks ago  . CPET / MET - PFTS     Consistent with chronotropic incompetence; See attached report in the results section  . DILATION AND CURETTAGE OF UTERUS     x 2  . DOPPLER ECHOCARDIOGRAPHY  01/10/2013   Normal LV size and function. Normal EF. Air sclerosis but  no stenosis  . ESOPHAGOGASTRODUODENOSCOPY  05/04/2012   Procedure: ESOPHAGOGASTRODUODENOSCOPY (EGD);  Surgeon: Inda Castle, MD;  Location: [facility];  Service: Endoscopy;  Laterality: N/A;  . EYE SURGERY     cataract extraction with ILO  ? eye  . IR KYPHO EA ADDL LEVEL THORACIC OR LUMBAR  10/28/2016  . IR KYPHO LUMBAR INC FX REDUCE BONE BX UNI/BIL CANNULATION INC/IMAGING  10/28/2016  . IR KYPHO THORACIC WITH BONE BIOPSY  12/24/2016  . IR RADIOLOGIST EVAL & MGMT  11/11/2016  . KNEE ARTHROSCOPY  2011   right  . TONSILLECTOMY    . TOTAL KNEE ARTHROPLASTY  04/24/2012   Procedure: TOTAL KNEE ARTHROPLASTY;  Surgeon: Gearlean Alf, MD;  Location: [facility];  Service: Orthopedics;  Laterality: Right;  . TRANSTHORACIC ECHOCARDIOGRAM  02/2018   Ordered by PCP for "congestive heart failure " -EF 60 M 65%.  GR 1 DD.  No R WMA--- > ESSENTIALLY NORMAL    No Known Allergies  Outpatient Encounter Medications as of 06/04/2019  Medication Sig  . acetaminophen (TYLENOL) 500 MG tablet Take by mouth 2 (two) times a day. [phone] mg  . atorvastatin (LIPITOR) 10 MG tablet Take 5 mg by mouth daily.   . Calcium-Phosphorus-Vitamin D (CITRACAL +D3 PO) Take 2 capsules by mouth 2 (two) times a day. 679m-25mcg(1000u)   .  docusate sodium (COLACE) 100 MG capsule Take 100 mg by mouth 2 (two) times daily.  . Fiber POWD Take 15 mLs by mouth daily. Mix with 4-8 oz of water  . fish oil-omega-3 fatty acids 1000 MG capsule Take 1 g by mouth daily.  . hydrALAZINE (APRESOLINE) 25 MG tablet Take 1 tablet (25 mg total) by mouth 2 (two) times daily.  Marland Kitchen KLOR-CON M20 20 MEQ tablet TAKE 1 TABLET BY MOUTH TWICE A DAY  . losartan (COZAAR) 100 MG tablet Take 1 tablet (100 mg total) by mouth daily.  . methocarbamol (ROBAXIN) 500 MG tablet Take 250 mg by mouth daily. For muscle spasms  . nitrofurantoin (MACRODANTIN) 50 MG capsule Take 1 capsule (50 mg total) by mouth daily.  . polyethylene glycol (MIRALAX / GLYCOLAX) 17 g packet Take 17  g by mouth daily. For constipation  . ALPRAZolam (XANAX) 0.25 MG tablet Take 1 tablet (0.25 mg total) by mouth at bedtime.  Marland Kitchen amLODipine (NORVASC) 10 MG tablet TAKE 1 TABLET BY MOUTH  DAILY  . buPROPion (WELLBUTRIN SR) 150 MG 12 hr tablet Take 1 tablet (150 mg total) by mouth 2 (two) times daily after a meal.  . Camphor-Menthol-Methyl Sal 3.07-31-08 % PTCH Place 1 patch onto the skin. Apply one to lower back daily for back pain  . gabapentin (NEURONTIN) 100 MG capsule Take 100 mg by mouth 3 (three) times daily.  Marland Kitchen levothyroxine (SYNTHROID, LEVOTHROID) 75 MCG tablet Take 1 tablet (75 mcg total) by mouth daily before breakfast.  . nystatin (MYCOSTATIN/NYSTOP) powder Apply 100,000 g topically 2 (two) times daily.  Marland Kitchen omeprazole (PRILOSEC) 40 MG capsule Take 40 mg by mouth daily.  . sertraline (ZOLOFT) 25 MG tablet Take 25 mg by mouth daily.  Marland Kitchen torsemide (DEMADEX) 20 MG tablet TAKE 1 TABLET BY MOUTH TWICE A DAY  . traMADol (ULTRAM) 50 MG tablet Take one tablet by mouth twice daily   No facility-administered encounter medications on file as of 06/04/2019.     Review of Systems:  Review of Systems  Constitutional: Negative.   HENT: Negative.   Respiratory: Negative.   Cardiovascular: Positive for leg swelling.  Gastrointestinal: Positive for constipation.  Genitourinary: Negative.   Musculoskeletal: Positive for back pain and gait problem.  Neurological: Positive for weakness.  Psychiatric/Behavioral: Positive for dysphoric mood.  All other systems reviewed and are negative.   Health Maintenance  Topic Date Due  . PNA vac Low Risk Adult (2 of 2 - PCV13) 12/21/2014  . TETANUS/TDAP  09/25/2016  . INFLUENZA VACCINE  Completed  . DEXA SCAN  Completed    Physical Exam: Vitals:   06/04/19 1258  BP: 101/63  Pulse: 66  Resp: 20  Temp: (!) 97.5 F (36.4 C)  SpO2: 90%  Weight: 179 lb 3.2 oz (81.3 kg)  Height: '5\' 4"'  (1.626 m)   Body mass index is 30.76 kg/m. Physical Exam Vitals signs  reviewed.  Constitutional:      Appearance: Normal appearance.  HENT:     Head: Normocephalic.     Nose: Nose normal.     Mouth/Throat:     Mouth: Mucous membranes are moist.     Pharynx: Oropharynx is clear.  Eyes:     Pupils: Pupils are equal, round, and reactive to light.  Neck:     Musculoskeletal: Neck supple.  Cardiovascular:     Rate and Rhythm: Normal rate and regular rhythm.     Pulses: Normal pulses.  Pulmonary:     Effort: Pulmonary  effort is normal.     Breath sounds: Normal breath sounds.  Abdominal:     General: Abdomen is flat. Bowel sounds are normal.     Palpations: Abdomen is soft.  Musculoskeletal:     Comments: Mild edema Bilateral  Skin:    General: Skin is warm.  Neurological:     General: No focal deficit present.     Mental Status: She is alert.  Psychiatric:        Mood and Affect: Mood normal.        Thought Content: Thought content normal.     Labs reviewed: Basic Metabolic Panel: Recent Labs    08/07/18 0745 11/23/18 0000 01/04/19  NA 139 134* 138  K 4.2 4.1 3.9  CL 100 96*  --   CO2 26 31  --   GLUCOSE 90 88  --   BUN 25 27* 28*  CREATININE 1.04* 1.30* 1.2*  CALCIUM 10.5* 10.2  --   TSH 4.34 2.90  --    Liver Function Tests: Recent Labs    08/07/18 0745 11/23/18 0000 01/04/19  AST '30 29 21  ' ALT '22 22 17  ' BILITOT 0.3 0.3  --   PROT 7.6 7.6  --    No results for input(s): LIPASE, AMYLASE in the last 8760 hours. No results for input(s): AMMONIA in the last 8760 hours. CBC: Recent Labs    08/07/18 0745 09/21/18 0815 11/23/18 0000 01/04/19 01/19/19  WBC 3.1* 2.9* 3.6*  --  3.6  NEUTROABS 1,494* 1,441* 2,048  --   --   HGB 10.5* 10.1* 10.7* 9.3* 8.9*  HCT 32.4* 30.6* 32.8* 29* 28*  MCV 86.9 86.0 85.4  --   --   PLT 79* 83* 88* 89* 74*   Lipid Panel: Recent Labs    08/07/18 0745 11/23/18 0000  CHOL 247* 227*  HDL 69 60  LDLCALC 155* 141*  TRIG 109 140  CHOLHDL 3.6 3.8   No results found for: HGBA1C   Procedures since last visit: No results found.  Assessment/Plan Essential Hypertension On Cozaar , Norvasc and Hydralazine Will decrase her Norvasc to 5 mg Continue to monitor   Other issues Chronic bilateral low back pain without sciatica  Ultram to 75 mg BID Continue tylenol prn Refusing for Norco Right now Also on Robaxin Also started her on Neurontin 100 mg TID   Depression with anxiety On Wellbutrin and Xanax and Zoloft  Mixed hyperlipidemia LDL 83 On Lipitor Acquired hypothyroidism TSH normal in 4/20 Pancytopenia ([place]) No Further Work up by hematology AS per Patient request Repeat CBC showed Hgb and Platelets stable Age-related osteoporosis Refused further Work up LE edema Doing good on Demadex Recurrent Cystitis Continue On Nitrofurantoin    Labs/tests ordered:  * No order type specified * Next appt:  Visit date not found

## 2019-06-26 DIAGNOSIS — K219 Gastro-esophageal reflux disease without esophagitis: Secondary | ICD-10-CM | POA: Diagnosis not present

## 2019-06-26 DIAGNOSIS — M545 Low back pain: Secondary | ICD-10-CM | POA: Diagnosis not present

## 2019-06-26 DIAGNOSIS — I10 Essential (primary) hypertension: Secondary | ICD-10-CM | POA: Diagnosis not present

## 2019-06-26 DIAGNOSIS — R269 Unspecified abnormalities of gait and mobility: Secondary | ICD-10-CM | POA: Diagnosis not present

## 2019-06-26 DIAGNOSIS — Z008 Encounter for other general examination: Secondary | ICD-10-CM | POA: Diagnosis not present

## 2019-06-26 DIAGNOSIS — Z1389 Encounter for screening for other disorder: Secondary | ICD-10-CM | POA: Diagnosis not present

## 2019-06-26 DIAGNOSIS — Z8719 Personal history of other diseases of the digestive system: Secondary | ICD-10-CM | POA: Diagnosis not present

## 2019-06-26 DIAGNOSIS — W19XXXA Unspecified fall, initial encounter: Secondary | ICD-10-CM | POA: Diagnosis not present

## 2019-06-26 DIAGNOSIS — N390 Urinary tract infection, site not specified: Secondary | ICD-10-CM | POA: Diagnosis not present

## 2019-06-26 DIAGNOSIS — M5380 Other specified dorsopathies, site unspecified: Secondary | ICD-10-CM | POA: Diagnosis not present

## 2019-06-26 DIAGNOSIS — M546 Pain in thoracic spine: Secondary | ICD-10-CM | POA: Diagnosis not present

## 2019-06-26 DIAGNOSIS — M810 Age-related osteoporosis without current pathological fracture: Secondary | ICD-10-CM | POA: Diagnosis not present

## 2019-06-26 DIAGNOSIS — R5381 Other malaise: Secondary | ICD-10-CM | POA: Diagnosis not present

## 2019-06-26 DIAGNOSIS — H9190 Unspecified hearing loss, unspecified ear: Secondary | ICD-10-CM | POA: Diagnosis not present

## 2019-06-26 DIAGNOSIS — M6281 Muscle weakness (generalized): Secondary | ICD-10-CM | POA: Diagnosis not present

## 2019-06-26 DIAGNOSIS — H8110 Benign paroxysmal vertigo, unspecified ear: Secondary | ICD-10-CM | POA: Diagnosis not present

## 2019-06-26 DIAGNOSIS — R0600 Dyspnea, unspecified: Secondary | ICD-10-CM | POA: Diagnosis not present

## 2019-06-26 DIAGNOSIS — M8580 Other specified disorders of bone density and structure, unspecified site: Secondary | ICD-10-CM | POA: Diagnosis not present

## 2019-06-26 DIAGNOSIS — Z23 Encounter for immunization: Secondary | ICD-10-CM | POA: Diagnosis not present

## 2019-06-26 DIAGNOSIS — Z0000 Encounter for general adult medical examination without abnormal findings: Secondary | ICD-10-CM | POA: Diagnosis not present

## 2019-06-26 DIAGNOSIS — M1990 Unspecified osteoarthritis, unspecified site: Secondary | ICD-10-CM | POA: Diagnosis not present

## 2019-06-26 DIAGNOSIS — R0781 Pleurodynia: Secondary | ICD-10-CM | POA: Diagnosis not present

## 2019-06-26 DIAGNOSIS — R2681 Unsteadiness on feet: Secondary | ICD-10-CM | POA: Diagnosis not present

## 2019-06-27 DIAGNOSIS — M546 Pain in thoracic spine: Secondary | ICD-10-CM | POA: Diagnosis not present

## 2019-06-27 DIAGNOSIS — M8580 Other specified disorders of bone density and structure, unspecified site: Secondary | ICD-10-CM | POA: Diagnosis not present

## 2019-06-27 DIAGNOSIS — M5380 Other specified dorsopathies, site unspecified: Secondary | ICD-10-CM | POA: Diagnosis not present

## 2019-06-27 DIAGNOSIS — K219 Gastro-esophageal reflux disease without esophagitis: Secondary | ICD-10-CM | POA: Diagnosis not present

## 2019-06-27 DIAGNOSIS — M1990 Unspecified osteoarthritis, unspecified site: Secondary | ICD-10-CM | POA: Diagnosis not present

## 2019-06-27 DIAGNOSIS — R5381 Other malaise: Secondary | ICD-10-CM | POA: Diagnosis not present

## 2019-06-27 DIAGNOSIS — M6281 Muscle weakness (generalized): Secondary | ICD-10-CM | POA: Diagnosis not present

## 2019-06-27 DIAGNOSIS — M810 Age-related osteoporosis without current pathological fracture: Secondary | ICD-10-CM | POA: Diagnosis not present

## 2019-06-27 DIAGNOSIS — R0781 Pleurodynia: Secondary | ICD-10-CM | POA: Diagnosis not present

## 2019-06-27 DIAGNOSIS — M545 Low back pain: Secondary | ICD-10-CM | POA: Diagnosis not present

## 2019-06-27 DIAGNOSIS — H9190 Unspecified hearing loss, unspecified ear: Secondary | ICD-10-CM | POA: Diagnosis not present

## 2019-06-27 DIAGNOSIS — R2681 Unsteadiness on feet: Secondary | ICD-10-CM | POA: Diagnosis not present

## 2019-06-27 DIAGNOSIS — I10 Essential (primary) hypertension: Secondary | ICD-10-CM | POA: Diagnosis not present

## 2019-06-27 DIAGNOSIS — N390 Urinary tract infection, site not specified: Secondary | ICD-10-CM | POA: Diagnosis not present

## 2019-06-27 DIAGNOSIS — Z8719 Personal history of other diseases of the digestive system: Secondary | ICD-10-CM | POA: Diagnosis not present

## 2019-06-27 DIAGNOSIS — H8110 Benign paroxysmal vertigo, unspecified ear: Secondary | ICD-10-CM | POA: Diagnosis not present

## 2019-06-27 DIAGNOSIS — R269 Unspecified abnormalities of gait and mobility: Secondary | ICD-10-CM | POA: Diagnosis not present

## 2019-06-27 DIAGNOSIS — Z1389 Encounter for screening for other disorder: Secondary | ICD-10-CM | POA: Diagnosis not present

## 2019-06-27 DIAGNOSIS — Z0000 Encounter for general adult medical examination without abnormal findings: Secondary | ICD-10-CM | POA: Diagnosis not present

## 2019-06-27 DIAGNOSIS — Z23 Encounter for immunization: Secondary | ICD-10-CM | POA: Diagnosis not present

## 2019-06-27 DIAGNOSIS — Z008 Encounter for other general examination: Secondary | ICD-10-CM | POA: Diagnosis not present

## 2019-06-27 DIAGNOSIS — W19XXXA Unspecified fall, initial encounter: Secondary | ICD-10-CM | POA: Diagnosis not present

## 2019-06-27 DIAGNOSIS — R0600 Dyspnea, unspecified: Secondary | ICD-10-CM | POA: Diagnosis not present

## 2019-06-28 DIAGNOSIS — I10 Essential (primary) hypertension: Secondary | ICD-10-CM | POA: Diagnosis not present

## 2019-06-28 DIAGNOSIS — Z1389 Encounter for screening for other disorder: Secondary | ICD-10-CM | POA: Diagnosis not present

## 2019-06-28 DIAGNOSIS — K219 Gastro-esophageal reflux disease without esophagitis: Secondary | ICD-10-CM | POA: Diagnosis not present

## 2019-06-28 DIAGNOSIS — H8110 Benign paroxysmal vertigo, unspecified ear: Secondary | ICD-10-CM | POA: Diagnosis not present

## 2019-06-28 DIAGNOSIS — W19XXXA Unspecified fall, initial encounter: Secondary | ICD-10-CM | POA: Diagnosis not present

## 2019-06-28 DIAGNOSIS — Z008 Encounter for other general examination: Secondary | ICD-10-CM | POA: Diagnosis not present

## 2019-06-28 DIAGNOSIS — M5380 Other specified dorsopathies, site unspecified: Secondary | ICD-10-CM | POA: Diagnosis not present

## 2019-06-28 DIAGNOSIS — Z0000 Encounter for general adult medical examination without abnormal findings: Secondary | ICD-10-CM | POA: Diagnosis not present

## 2019-06-28 DIAGNOSIS — M1990 Unspecified osteoarthritis, unspecified site: Secondary | ICD-10-CM | POA: Diagnosis not present

## 2019-06-28 DIAGNOSIS — M545 Low back pain: Secondary | ICD-10-CM | POA: Diagnosis not present

## 2019-06-28 DIAGNOSIS — M810 Age-related osteoporosis without current pathological fracture: Secondary | ICD-10-CM | POA: Diagnosis not present

## 2019-06-28 DIAGNOSIS — R2681 Unsteadiness on feet: Secondary | ICD-10-CM | POA: Diagnosis not present

## 2019-06-28 DIAGNOSIS — R0600 Dyspnea, unspecified: Secondary | ICD-10-CM | POA: Diagnosis not present

## 2019-06-28 DIAGNOSIS — N390 Urinary tract infection, site not specified: Secondary | ICD-10-CM | POA: Diagnosis not present

## 2019-06-28 DIAGNOSIS — M546 Pain in thoracic spine: Secondary | ICD-10-CM | POA: Diagnosis not present

## 2019-06-28 DIAGNOSIS — M8580 Other specified disorders of bone density and structure, unspecified site: Secondary | ICD-10-CM | POA: Diagnosis not present

## 2019-06-28 DIAGNOSIS — R0781 Pleurodynia: Secondary | ICD-10-CM | POA: Diagnosis not present

## 2019-06-28 DIAGNOSIS — Z8719 Personal history of other diseases of the digestive system: Secondary | ICD-10-CM | POA: Diagnosis not present

## 2019-06-28 DIAGNOSIS — Z23 Encounter for immunization: Secondary | ICD-10-CM | POA: Diagnosis not present

## 2019-06-28 DIAGNOSIS — M6281 Muscle weakness (generalized): Secondary | ICD-10-CM | POA: Diagnosis not present

## 2019-06-28 DIAGNOSIS — R5381 Other malaise: Secondary | ICD-10-CM | POA: Diagnosis not present

## 2019-06-28 DIAGNOSIS — H9190 Unspecified hearing loss, unspecified ear: Secondary | ICD-10-CM | POA: Diagnosis not present

## 2019-06-28 DIAGNOSIS — R269 Unspecified abnormalities of gait and mobility: Secondary | ICD-10-CM | POA: Diagnosis not present

## 2019-07-03 ENCOUNTER — Other Ambulatory Visit: Payer: Self-pay | Admitting: Nurse Practitioner

## 2019-07-03 DIAGNOSIS — Z23 Encounter for immunization: Secondary | ICD-10-CM | POA: Diagnosis not present

## 2019-07-03 DIAGNOSIS — R2681 Unsteadiness on feet: Secondary | ICD-10-CM | POA: Diagnosis not present

## 2019-07-03 DIAGNOSIS — M546 Pain in thoracic spine: Secondary | ICD-10-CM | POA: Diagnosis not present

## 2019-07-03 DIAGNOSIS — M5380 Other specified dorsopathies, site unspecified: Secondary | ICD-10-CM | POA: Diagnosis not present

## 2019-07-03 DIAGNOSIS — R0600 Dyspnea, unspecified: Secondary | ICD-10-CM | POA: Diagnosis not present

## 2019-07-03 DIAGNOSIS — R0781 Pleurodynia: Secondary | ICD-10-CM | POA: Diagnosis not present

## 2019-07-03 DIAGNOSIS — R5381 Other malaise: Secondary | ICD-10-CM | POA: Diagnosis not present

## 2019-07-03 DIAGNOSIS — Z1389 Encounter for screening for other disorder: Secondary | ICD-10-CM | POA: Diagnosis not present

## 2019-07-03 DIAGNOSIS — H8110 Benign paroxysmal vertigo, unspecified ear: Secondary | ICD-10-CM | POA: Diagnosis not present

## 2019-07-03 DIAGNOSIS — K219 Gastro-esophageal reflux disease without esophagitis: Secondary | ICD-10-CM | POA: Diagnosis not present

## 2019-07-03 DIAGNOSIS — W19XXXA Unspecified fall, initial encounter: Secondary | ICD-10-CM | POA: Diagnosis not present

## 2019-07-03 DIAGNOSIS — H9190 Unspecified hearing loss, unspecified ear: Secondary | ICD-10-CM | POA: Diagnosis not present

## 2019-07-03 DIAGNOSIS — M545 Low back pain: Secondary | ICD-10-CM | POA: Diagnosis not present

## 2019-07-03 DIAGNOSIS — M6281 Muscle weakness (generalized): Secondary | ICD-10-CM | POA: Diagnosis not present

## 2019-07-03 DIAGNOSIS — Z8719 Personal history of other diseases of the digestive system: Secondary | ICD-10-CM | POA: Diagnosis not present

## 2019-07-03 DIAGNOSIS — R269 Unspecified abnormalities of gait and mobility: Secondary | ICD-10-CM | POA: Diagnosis not present

## 2019-07-03 DIAGNOSIS — M8580 Other specified disorders of bone density and structure, unspecified site: Secondary | ICD-10-CM | POA: Diagnosis not present

## 2019-07-03 DIAGNOSIS — M810 Age-related osteoporosis without current pathological fracture: Secondary | ICD-10-CM | POA: Diagnosis not present

## 2019-07-03 DIAGNOSIS — N390 Urinary tract infection, site not specified: Secondary | ICD-10-CM | POA: Diagnosis not present

## 2019-07-03 DIAGNOSIS — Z0000 Encounter for general adult medical examination without abnormal findings: Secondary | ICD-10-CM | POA: Diagnosis not present

## 2019-07-03 DIAGNOSIS — M1990 Unspecified osteoarthritis, unspecified site: Secondary | ICD-10-CM | POA: Diagnosis not present

## 2019-07-03 DIAGNOSIS — I10 Essential (primary) hypertension: Secondary | ICD-10-CM | POA: Diagnosis not present

## 2019-07-03 DIAGNOSIS — Z008 Encounter for other general examination: Secondary | ICD-10-CM | POA: Diagnosis not present

## 2019-07-03 MED ORDER — ALPRAZOLAM 0.25 MG PO TABS: 0.2500 mg | ORAL_TABLET | Freq: Every day | ORAL | 0 refills | Status: DC

## 2019-07-04 DIAGNOSIS — Z8719 Personal history of other diseases of the digestive system: Secondary | ICD-10-CM | POA: Diagnosis not present

## 2019-07-04 DIAGNOSIS — N390 Urinary tract infection, site not specified: Secondary | ICD-10-CM | POA: Diagnosis not present

## 2019-07-04 DIAGNOSIS — W19XXXA Unspecified fall, initial encounter: Secondary | ICD-10-CM | POA: Diagnosis not present

## 2019-07-04 DIAGNOSIS — H8110 Benign paroxysmal vertigo, unspecified ear: Secondary | ICD-10-CM | POA: Diagnosis not present

## 2019-07-04 DIAGNOSIS — M6281 Muscle weakness (generalized): Secondary | ICD-10-CM | POA: Diagnosis not present

## 2019-07-04 DIAGNOSIS — K219 Gastro-esophageal reflux disease without esophagitis: Secondary | ICD-10-CM | POA: Diagnosis not present

## 2019-07-04 DIAGNOSIS — Z0000 Encounter for general adult medical examination without abnormal findings: Secondary | ICD-10-CM | POA: Diagnosis not present

## 2019-07-04 DIAGNOSIS — Z1389 Encounter for screening for other disorder: Secondary | ICD-10-CM | POA: Diagnosis not present

## 2019-07-04 DIAGNOSIS — R0600 Dyspnea, unspecified: Secondary | ICD-10-CM | POA: Diagnosis not present

## 2019-07-04 DIAGNOSIS — H9190 Unspecified hearing loss, unspecified ear: Secondary | ICD-10-CM | POA: Diagnosis not present

## 2019-07-04 DIAGNOSIS — M8580 Other specified disorders of bone density and structure, unspecified site: Secondary | ICD-10-CM | POA: Diagnosis not present

## 2019-07-04 DIAGNOSIS — R0781 Pleurodynia: Secondary | ICD-10-CM | POA: Diagnosis not present

## 2019-07-04 DIAGNOSIS — M5380 Other specified dorsopathies, site unspecified: Secondary | ICD-10-CM | POA: Diagnosis not present

## 2019-07-04 DIAGNOSIS — Z008 Encounter for other general examination: Secondary | ICD-10-CM | POA: Diagnosis not present

## 2019-07-04 DIAGNOSIS — R2681 Unsteadiness on feet: Secondary | ICD-10-CM | POA: Diagnosis not present

## 2019-07-04 DIAGNOSIS — Z23 Encounter for immunization: Secondary | ICD-10-CM | POA: Diagnosis not present

## 2019-07-04 DIAGNOSIS — I10 Essential (primary) hypertension: Secondary | ICD-10-CM | POA: Diagnosis not present

## 2019-07-04 DIAGNOSIS — R5381 Other malaise: Secondary | ICD-10-CM | POA: Diagnosis not present

## 2019-07-04 DIAGNOSIS — R269 Unspecified abnormalities of gait and mobility: Secondary | ICD-10-CM | POA: Diagnosis not present

## 2019-07-04 DIAGNOSIS — M810 Age-related osteoporosis without current pathological fracture: Secondary | ICD-10-CM | POA: Diagnosis not present

## 2019-07-04 DIAGNOSIS — M546 Pain in thoracic spine: Secondary | ICD-10-CM | POA: Diagnosis not present

## 2019-07-04 DIAGNOSIS — M1990 Unspecified osteoarthritis, unspecified site: Secondary | ICD-10-CM | POA: Diagnosis not present

## 2019-07-04 DIAGNOSIS — M545 Low back pain: Secondary | ICD-10-CM | POA: Diagnosis not present

## 2019-07-05 DIAGNOSIS — I10 Essential (primary) hypertension: Secondary | ICD-10-CM | POA: Diagnosis not present

## 2019-07-05 DIAGNOSIS — R5381 Other malaise: Secondary | ICD-10-CM | POA: Diagnosis not present

## 2019-07-05 DIAGNOSIS — R269 Unspecified abnormalities of gait and mobility: Secondary | ICD-10-CM | POA: Diagnosis not present

## 2019-07-05 DIAGNOSIS — R2681 Unsteadiness on feet: Secondary | ICD-10-CM | POA: Diagnosis not present

## 2019-07-05 DIAGNOSIS — Z1389 Encounter for screening for other disorder: Secondary | ICD-10-CM | POA: Diagnosis not present

## 2019-07-05 DIAGNOSIS — H9190 Unspecified hearing loss, unspecified ear: Secondary | ICD-10-CM | POA: Diagnosis not present

## 2019-07-05 DIAGNOSIS — Z008 Encounter for other general examination: Secondary | ICD-10-CM | POA: Diagnosis not present

## 2019-07-05 DIAGNOSIS — R0781 Pleurodynia: Secondary | ICD-10-CM | POA: Diagnosis not present

## 2019-07-05 DIAGNOSIS — Z0000 Encounter for general adult medical examination without abnormal findings: Secondary | ICD-10-CM | POA: Diagnosis not present

## 2019-07-05 DIAGNOSIS — R0600 Dyspnea, unspecified: Secondary | ICD-10-CM | POA: Diagnosis not present

## 2019-07-05 DIAGNOSIS — M1990 Unspecified osteoarthritis, unspecified site: Secondary | ICD-10-CM | POA: Diagnosis not present

## 2019-07-05 DIAGNOSIS — Z23 Encounter for immunization: Secondary | ICD-10-CM | POA: Diagnosis not present

## 2019-07-05 DIAGNOSIS — M810 Age-related osteoporosis without current pathological fracture: Secondary | ICD-10-CM | POA: Diagnosis not present

## 2019-07-05 DIAGNOSIS — M6281 Muscle weakness (generalized): Secondary | ICD-10-CM | POA: Diagnosis not present

## 2019-07-05 DIAGNOSIS — Z8719 Personal history of other diseases of the digestive system: Secondary | ICD-10-CM | POA: Diagnosis not present

## 2019-07-05 DIAGNOSIS — M546 Pain in thoracic spine: Secondary | ICD-10-CM | POA: Diagnosis not present

## 2019-07-05 DIAGNOSIS — M5380 Other specified dorsopathies, site unspecified: Secondary | ICD-10-CM | POA: Diagnosis not present

## 2019-07-05 DIAGNOSIS — H8110 Benign paroxysmal vertigo, unspecified ear: Secondary | ICD-10-CM | POA: Diagnosis not present

## 2019-07-05 DIAGNOSIS — K219 Gastro-esophageal reflux disease without esophagitis: Secondary | ICD-10-CM | POA: Diagnosis not present

## 2019-07-05 DIAGNOSIS — W19XXXA Unspecified fall, initial encounter: Secondary | ICD-10-CM | POA: Diagnosis not present

## 2019-07-05 DIAGNOSIS — M8580 Other specified disorders of bone density and structure, unspecified site: Secondary | ICD-10-CM | POA: Diagnosis not present

## 2019-07-05 DIAGNOSIS — M545 Low back pain: Secondary | ICD-10-CM | POA: Diagnosis not present

## 2019-07-05 DIAGNOSIS — N390 Urinary tract infection, site not specified: Secondary | ICD-10-CM | POA: Diagnosis not present

## 2019-07-10 DIAGNOSIS — M545 Low back pain: Secondary | ICD-10-CM | POA: Diagnosis not present

## 2019-07-10 DIAGNOSIS — Z23 Encounter for immunization: Secondary | ICD-10-CM | POA: Diagnosis not present

## 2019-07-10 DIAGNOSIS — H9190 Unspecified hearing loss, unspecified ear: Secondary | ICD-10-CM | POA: Diagnosis not present

## 2019-07-10 DIAGNOSIS — Z008 Encounter for other general examination: Secondary | ICD-10-CM | POA: Diagnosis not present

## 2019-07-10 DIAGNOSIS — H8110 Benign paroxysmal vertigo, unspecified ear: Secondary | ICD-10-CM | POA: Diagnosis not present

## 2019-07-10 DIAGNOSIS — Z8719 Personal history of other diseases of the digestive system: Secondary | ICD-10-CM | POA: Diagnosis not present

## 2019-07-10 DIAGNOSIS — R0781 Pleurodynia: Secondary | ICD-10-CM | POA: Diagnosis not present

## 2019-07-10 DIAGNOSIS — W19XXXA Unspecified fall, initial encounter: Secondary | ICD-10-CM | POA: Diagnosis not present

## 2019-07-10 DIAGNOSIS — R2681 Unsteadiness on feet: Secondary | ICD-10-CM | POA: Diagnosis not present

## 2019-07-10 DIAGNOSIS — M546 Pain in thoracic spine: Secondary | ICD-10-CM | POA: Diagnosis not present

## 2019-07-10 DIAGNOSIS — N390 Urinary tract infection, site not specified: Secondary | ICD-10-CM | POA: Diagnosis not present

## 2019-07-10 DIAGNOSIS — M8580 Other specified disorders of bone density and structure, unspecified site: Secondary | ICD-10-CM | POA: Diagnosis not present

## 2019-07-10 DIAGNOSIS — M5380 Other specified dorsopathies, site unspecified: Secondary | ICD-10-CM | POA: Diagnosis not present

## 2019-07-10 DIAGNOSIS — Z0000 Encounter for general adult medical examination without abnormal findings: Secondary | ICD-10-CM | POA: Diagnosis not present

## 2019-07-10 DIAGNOSIS — Z1389 Encounter for screening for other disorder: Secondary | ICD-10-CM | POA: Diagnosis not present

## 2019-07-10 DIAGNOSIS — M6281 Muscle weakness (generalized): Secondary | ICD-10-CM | POA: Diagnosis not present

## 2019-07-10 DIAGNOSIS — K219 Gastro-esophageal reflux disease without esophagitis: Secondary | ICD-10-CM | POA: Diagnosis not present

## 2019-07-10 DIAGNOSIS — M810 Age-related osteoporosis without current pathological fracture: Secondary | ICD-10-CM | POA: Diagnosis not present

## 2019-07-10 DIAGNOSIS — R0600 Dyspnea, unspecified: Secondary | ICD-10-CM | POA: Diagnosis not present

## 2019-07-10 DIAGNOSIS — R269 Unspecified abnormalities of gait and mobility: Secondary | ICD-10-CM | POA: Diagnosis not present

## 2019-07-10 DIAGNOSIS — R5381 Other malaise: Secondary | ICD-10-CM | POA: Diagnosis not present

## 2019-07-10 DIAGNOSIS — I10 Essential (primary) hypertension: Secondary | ICD-10-CM | POA: Diagnosis not present

## 2019-07-10 DIAGNOSIS — M1990 Unspecified osteoarthritis, unspecified site: Secondary | ICD-10-CM | POA: Diagnosis not present

## 2019-07-11 DIAGNOSIS — R0600 Dyspnea, unspecified: Secondary | ICD-10-CM | POA: Diagnosis not present

## 2019-07-11 DIAGNOSIS — K219 Gastro-esophageal reflux disease without esophagitis: Secondary | ICD-10-CM | POA: Diagnosis not present

## 2019-07-11 DIAGNOSIS — I10 Essential (primary) hypertension: Secondary | ICD-10-CM | POA: Diagnosis not present

## 2019-07-11 DIAGNOSIS — Z0000 Encounter for general adult medical examination without abnormal findings: Secondary | ICD-10-CM | POA: Diagnosis not present

## 2019-07-11 DIAGNOSIS — M1990 Unspecified osteoarthritis, unspecified site: Secondary | ICD-10-CM | POA: Diagnosis not present

## 2019-07-11 DIAGNOSIS — Z1389 Encounter for screening for other disorder: Secondary | ICD-10-CM | POA: Diagnosis not present

## 2019-07-11 DIAGNOSIS — Z23 Encounter for immunization: Secondary | ICD-10-CM | POA: Diagnosis not present

## 2019-07-11 DIAGNOSIS — Z8719 Personal history of other diseases of the digestive system: Secondary | ICD-10-CM | POA: Diagnosis not present

## 2019-07-11 DIAGNOSIS — R2681 Unsteadiness on feet: Secondary | ICD-10-CM | POA: Diagnosis not present

## 2019-07-11 DIAGNOSIS — M6281 Muscle weakness (generalized): Secondary | ICD-10-CM | POA: Diagnosis not present

## 2019-07-11 DIAGNOSIS — Z008 Encounter for other general examination: Secondary | ICD-10-CM | POA: Diagnosis not present

## 2019-07-11 DIAGNOSIS — M8580 Other specified disorders of bone density and structure, unspecified site: Secondary | ICD-10-CM | POA: Diagnosis not present

## 2019-07-11 DIAGNOSIS — M810 Age-related osteoporosis without current pathological fracture: Secondary | ICD-10-CM | POA: Diagnosis not present

## 2019-07-11 DIAGNOSIS — R0781 Pleurodynia: Secondary | ICD-10-CM | POA: Diagnosis not present

## 2019-07-11 DIAGNOSIS — H8110 Benign paroxysmal vertigo, unspecified ear: Secondary | ICD-10-CM | POA: Diagnosis not present

## 2019-07-11 DIAGNOSIS — M545 Low back pain: Secondary | ICD-10-CM | POA: Diagnosis not present

## 2019-07-11 DIAGNOSIS — N390 Urinary tract infection, site not specified: Secondary | ICD-10-CM | POA: Diagnosis not present

## 2019-07-11 DIAGNOSIS — M546 Pain in thoracic spine: Secondary | ICD-10-CM | POA: Diagnosis not present

## 2019-07-11 DIAGNOSIS — H9190 Unspecified hearing loss, unspecified ear: Secondary | ICD-10-CM | POA: Diagnosis not present

## 2019-07-11 DIAGNOSIS — M5380 Other specified dorsopathies, site unspecified: Secondary | ICD-10-CM | POA: Diagnosis not present

## 2019-07-11 DIAGNOSIS — R5381 Other malaise: Secondary | ICD-10-CM | POA: Diagnosis not present

## 2019-07-11 DIAGNOSIS — W19XXXA Unspecified fall, initial encounter: Secondary | ICD-10-CM | POA: Diagnosis not present

## 2019-07-11 DIAGNOSIS — R269 Unspecified abnormalities of gait and mobility: Secondary | ICD-10-CM | POA: Diagnosis not present

## 2019-07-12 DIAGNOSIS — M1990 Unspecified osteoarthritis, unspecified site: Secondary | ICD-10-CM | POA: Diagnosis not present

## 2019-07-12 DIAGNOSIS — Z0000 Encounter for general adult medical examination without abnormal findings: Secondary | ICD-10-CM | POA: Diagnosis not present

## 2019-07-12 DIAGNOSIS — M8580 Other specified disorders of bone density and structure, unspecified site: Secondary | ICD-10-CM | POA: Diagnosis not present

## 2019-07-12 DIAGNOSIS — Z1389 Encounter for screening for other disorder: Secondary | ICD-10-CM | POA: Diagnosis not present

## 2019-07-12 DIAGNOSIS — H8110 Benign paroxysmal vertigo, unspecified ear: Secondary | ICD-10-CM | POA: Diagnosis not present

## 2019-07-12 DIAGNOSIS — Z23 Encounter for immunization: Secondary | ICD-10-CM | POA: Diagnosis not present

## 2019-07-12 DIAGNOSIS — K219 Gastro-esophageal reflux disease without esophagitis: Secondary | ICD-10-CM | POA: Diagnosis not present

## 2019-07-12 DIAGNOSIS — Z008 Encounter for other general examination: Secondary | ICD-10-CM | POA: Diagnosis not present

## 2019-07-12 DIAGNOSIS — M6281 Muscle weakness (generalized): Secondary | ICD-10-CM | POA: Diagnosis not present

## 2019-07-12 DIAGNOSIS — M545 Low back pain: Secondary | ICD-10-CM | POA: Diagnosis not present

## 2019-07-12 DIAGNOSIS — R2681 Unsteadiness on feet: Secondary | ICD-10-CM | POA: Diagnosis not present

## 2019-07-12 DIAGNOSIS — M5380 Other specified dorsopathies, site unspecified: Secondary | ICD-10-CM | POA: Diagnosis not present

## 2019-07-12 DIAGNOSIS — R269 Unspecified abnormalities of gait and mobility: Secondary | ICD-10-CM | POA: Diagnosis not present

## 2019-07-12 DIAGNOSIS — M810 Age-related osteoporosis without current pathological fracture: Secondary | ICD-10-CM | POA: Diagnosis not present

## 2019-07-12 DIAGNOSIS — I10 Essential (primary) hypertension: Secondary | ICD-10-CM | POA: Diagnosis not present

## 2019-07-12 DIAGNOSIS — M546 Pain in thoracic spine: Secondary | ICD-10-CM | POA: Diagnosis not present

## 2019-07-12 DIAGNOSIS — H9190 Unspecified hearing loss, unspecified ear: Secondary | ICD-10-CM | POA: Diagnosis not present

## 2019-07-12 DIAGNOSIS — Z8719 Personal history of other diseases of the digestive system: Secondary | ICD-10-CM | POA: Diagnosis not present

## 2019-07-12 DIAGNOSIS — W19XXXA Unspecified fall, initial encounter: Secondary | ICD-10-CM | POA: Diagnosis not present

## 2019-07-12 DIAGNOSIS — R0600 Dyspnea, unspecified: Secondary | ICD-10-CM | POA: Diagnosis not present

## 2019-07-12 DIAGNOSIS — N390 Urinary tract infection, site not specified: Secondary | ICD-10-CM | POA: Diagnosis not present

## 2019-07-12 DIAGNOSIS — R5381 Other malaise: Secondary | ICD-10-CM | POA: Diagnosis not present

## 2019-07-12 DIAGNOSIS — R0781 Pleurodynia: Secondary | ICD-10-CM | POA: Diagnosis not present

## 2019-07-13 DIAGNOSIS — Z0000 Encounter for general adult medical examination without abnormal findings: Secondary | ICD-10-CM | POA: Diagnosis not present

## 2019-07-13 DIAGNOSIS — Z8719 Personal history of other diseases of the digestive system: Secondary | ICD-10-CM | POA: Diagnosis not present

## 2019-07-13 DIAGNOSIS — M5380 Other specified dorsopathies, site unspecified: Secondary | ICD-10-CM | POA: Diagnosis not present

## 2019-07-13 DIAGNOSIS — M8580 Other specified disorders of bone density and structure, unspecified site: Secondary | ICD-10-CM | POA: Diagnosis not present

## 2019-07-13 DIAGNOSIS — R0781 Pleurodynia: Secondary | ICD-10-CM | POA: Diagnosis not present

## 2019-07-13 DIAGNOSIS — Z23 Encounter for immunization: Secondary | ICD-10-CM | POA: Diagnosis not present

## 2019-07-13 DIAGNOSIS — M6281 Muscle weakness (generalized): Secondary | ICD-10-CM | POA: Diagnosis not present

## 2019-07-13 DIAGNOSIS — R2681 Unsteadiness on feet: Secondary | ICD-10-CM | POA: Diagnosis not present

## 2019-07-13 DIAGNOSIS — H9190 Unspecified hearing loss, unspecified ear: Secondary | ICD-10-CM | POA: Diagnosis not present

## 2019-07-13 DIAGNOSIS — K219 Gastro-esophageal reflux disease without esophagitis: Secondary | ICD-10-CM | POA: Diagnosis not present

## 2019-07-13 DIAGNOSIS — R269 Unspecified abnormalities of gait and mobility: Secondary | ICD-10-CM | POA: Diagnosis not present

## 2019-07-13 DIAGNOSIS — M810 Age-related osteoporosis without current pathological fracture: Secondary | ICD-10-CM | POA: Diagnosis not present

## 2019-07-13 DIAGNOSIS — N390 Urinary tract infection, site not specified: Secondary | ICD-10-CM | POA: Diagnosis not present

## 2019-07-13 DIAGNOSIS — Z1389 Encounter for screening for other disorder: Secondary | ICD-10-CM | POA: Diagnosis not present

## 2019-07-13 DIAGNOSIS — M1990 Unspecified osteoarthritis, unspecified site: Secondary | ICD-10-CM | POA: Diagnosis not present

## 2019-07-13 DIAGNOSIS — W19XXXA Unspecified fall, initial encounter: Secondary | ICD-10-CM | POA: Diagnosis not present

## 2019-07-13 DIAGNOSIS — M546 Pain in thoracic spine: Secondary | ICD-10-CM | POA: Diagnosis not present

## 2019-07-13 DIAGNOSIS — Z008 Encounter for other general examination: Secondary | ICD-10-CM | POA: Diagnosis not present

## 2019-07-13 DIAGNOSIS — H8110 Benign paroxysmal vertigo, unspecified ear: Secondary | ICD-10-CM | POA: Diagnosis not present

## 2019-07-13 DIAGNOSIS — R0600 Dyspnea, unspecified: Secondary | ICD-10-CM | POA: Diagnosis not present

## 2019-07-13 DIAGNOSIS — R5381 Other malaise: Secondary | ICD-10-CM | POA: Diagnosis not present

## 2019-07-13 DIAGNOSIS — I10 Essential (primary) hypertension: Secondary | ICD-10-CM | POA: Diagnosis not present

## 2019-07-13 DIAGNOSIS — M545 Low back pain: Secondary | ICD-10-CM | POA: Diagnosis not present

## 2019-07-16 DIAGNOSIS — K219 Gastro-esophageal reflux disease without esophagitis: Secondary | ICD-10-CM | POA: Diagnosis not present

## 2019-07-16 DIAGNOSIS — W19XXXA Unspecified fall, initial encounter: Secondary | ICD-10-CM | POA: Diagnosis not present

## 2019-07-16 DIAGNOSIS — I10 Essential (primary) hypertension: Secondary | ICD-10-CM | POA: Diagnosis not present

## 2019-07-16 DIAGNOSIS — Z0000 Encounter for general adult medical examination without abnormal findings: Secondary | ICD-10-CM | POA: Diagnosis not present

## 2019-07-16 DIAGNOSIS — Z008 Encounter for other general examination: Secondary | ICD-10-CM | POA: Diagnosis not present

## 2019-07-16 DIAGNOSIS — Z1389 Encounter for screening for other disorder: Secondary | ICD-10-CM | POA: Diagnosis not present

## 2019-07-16 DIAGNOSIS — R5381 Other malaise: Secondary | ICD-10-CM | POA: Diagnosis not present

## 2019-07-16 DIAGNOSIS — M810 Age-related osteoporosis without current pathological fracture: Secondary | ICD-10-CM | POA: Diagnosis not present

## 2019-07-16 DIAGNOSIS — N390 Urinary tract infection, site not specified: Secondary | ICD-10-CM | POA: Diagnosis not present

## 2019-07-16 DIAGNOSIS — M5380 Other specified dorsopathies, site unspecified: Secondary | ICD-10-CM | POA: Diagnosis not present

## 2019-07-16 DIAGNOSIS — R0781 Pleurodynia: Secondary | ICD-10-CM | POA: Diagnosis not present

## 2019-07-16 DIAGNOSIS — H9190 Unspecified hearing loss, unspecified ear: Secondary | ICD-10-CM | POA: Diagnosis not present

## 2019-07-16 DIAGNOSIS — R2681 Unsteadiness on feet: Secondary | ICD-10-CM | POA: Diagnosis not present

## 2019-07-16 DIAGNOSIS — H8110 Benign paroxysmal vertigo, unspecified ear: Secondary | ICD-10-CM | POA: Diagnosis not present

## 2019-07-16 DIAGNOSIS — M1990 Unspecified osteoarthritis, unspecified site: Secondary | ICD-10-CM | POA: Diagnosis not present

## 2019-07-16 DIAGNOSIS — M8580 Other specified disorders of bone density and structure, unspecified site: Secondary | ICD-10-CM | POA: Diagnosis not present

## 2019-07-16 DIAGNOSIS — M545 Low back pain: Secondary | ICD-10-CM | POA: Diagnosis not present

## 2019-07-16 DIAGNOSIS — Z23 Encounter for immunization: Secondary | ICD-10-CM | POA: Diagnosis not present

## 2019-07-16 DIAGNOSIS — M6281 Muscle weakness (generalized): Secondary | ICD-10-CM | POA: Diagnosis not present

## 2019-07-16 DIAGNOSIS — R0600 Dyspnea, unspecified: Secondary | ICD-10-CM | POA: Diagnosis not present

## 2019-07-16 DIAGNOSIS — M546 Pain in thoracic spine: Secondary | ICD-10-CM | POA: Diagnosis not present

## 2019-07-16 DIAGNOSIS — Z8719 Personal history of other diseases of the digestive system: Secondary | ICD-10-CM | POA: Diagnosis not present

## 2019-07-16 DIAGNOSIS — R269 Unspecified abnormalities of gait and mobility: Secondary | ICD-10-CM | POA: Diagnosis not present

## 2019-07-17 DIAGNOSIS — R0600 Dyspnea, unspecified: Secondary | ICD-10-CM | POA: Diagnosis not present

## 2019-07-17 DIAGNOSIS — R0781 Pleurodynia: Secondary | ICD-10-CM | POA: Diagnosis not present

## 2019-07-17 DIAGNOSIS — Z1389 Encounter for screening for other disorder: Secondary | ICD-10-CM | POA: Diagnosis not present

## 2019-07-17 DIAGNOSIS — M8580 Other specified disorders of bone density and structure, unspecified site: Secondary | ICD-10-CM | POA: Diagnosis not present

## 2019-07-17 DIAGNOSIS — Z008 Encounter for other general examination: Secondary | ICD-10-CM | POA: Diagnosis not present

## 2019-07-17 DIAGNOSIS — Z8719 Personal history of other diseases of the digestive system: Secondary | ICD-10-CM | POA: Diagnosis not present

## 2019-07-17 DIAGNOSIS — M6281 Muscle weakness (generalized): Secondary | ICD-10-CM | POA: Diagnosis not present

## 2019-07-17 DIAGNOSIS — Z0000 Encounter for general adult medical examination without abnormal findings: Secondary | ICD-10-CM | POA: Diagnosis not present

## 2019-07-17 DIAGNOSIS — M545 Low back pain: Secondary | ICD-10-CM | POA: Diagnosis not present

## 2019-07-17 DIAGNOSIS — R2681 Unsteadiness on feet: Secondary | ICD-10-CM | POA: Diagnosis not present

## 2019-07-17 DIAGNOSIS — H9190 Unspecified hearing loss, unspecified ear: Secondary | ICD-10-CM | POA: Diagnosis not present

## 2019-07-17 DIAGNOSIS — I10 Essential (primary) hypertension: Secondary | ICD-10-CM | POA: Diagnosis not present

## 2019-07-17 DIAGNOSIS — M810 Age-related osteoporosis without current pathological fracture: Secondary | ICD-10-CM | POA: Diagnosis not present

## 2019-07-17 DIAGNOSIS — M546 Pain in thoracic spine: Secondary | ICD-10-CM | POA: Diagnosis not present

## 2019-07-17 DIAGNOSIS — H8110 Benign paroxysmal vertigo, unspecified ear: Secondary | ICD-10-CM | POA: Diagnosis not present

## 2019-07-17 DIAGNOSIS — M1990 Unspecified osteoarthritis, unspecified site: Secondary | ICD-10-CM | POA: Diagnosis not present

## 2019-07-17 DIAGNOSIS — K219 Gastro-esophageal reflux disease without esophagitis: Secondary | ICD-10-CM | POA: Diagnosis not present

## 2019-07-17 DIAGNOSIS — R269 Unspecified abnormalities of gait and mobility: Secondary | ICD-10-CM | POA: Diagnosis not present

## 2019-07-17 DIAGNOSIS — Z23 Encounter for immunization: Secondary | ICD-10-CM | POA: Diagnosis not present

## 2019-07-17 DIAGNOSIS — M5380 Other specified dorsopathies, site unspecified: Secondary | ICD-10-CM | POA: Diagnosis not present

## 2019-07-17 DIAGNOSIS — N390 Urinary tract infection, site not specified: Secondary | ICD-10-CM | POA: Diagnosis not present

## 2019-07-17 DIAGNOSIS — R5381 Other malaise: Secondary | ICD-10-CM | POA: Diagnosis not present

## 2019-07-17 DIAGNOSIS — W19XXXA Unspecified fall, initial encounter: Secondary | ICD-10-CM | POA: Diagnosis not present

## 2019-07-22 DIAGNOSIS — R2681 Unsteadiness on feet: Secondary | ICD-10-CM | POA: Diagnosis not present

## 2019-07-22 DIAGNOSIS — H9190 Unspecified hearing loss, unspecified ear: Secondary | ICD-10-CM | POA: Diagnosis not present

## 2019-07-22 DIAGNOSIS — M5380 Other specified dorsopathies, site unspecified: Secondary | ICD-10-CM | POA: Diagnosis not present

## 2019-07-22 DIAGNOSIS — Z0000 Encounter for general adult medical examination without abnormal findings: Secondary | ICD-10-CM | POA: Diagnosis not present

## 2019-07-22 DIAGNOSIS — M810 Age-related osteoporosis without current pathological fracture: Secondary | ICD-10-CM | POA: Diagnosis not present

## 2019-07-22 DIAGNOSIS — I10 Essential (primary) hypertension: Secondary | ICD-10-CM | POA: Diagnosis not present

## 2019-07-22 DIAGNOSIS — R0781 Pleurodynia: Secondary | ICD-10-CM | POA: Diagnosis not present

## 2019-07-22 DIAGNOSIS — N390 Urinary tract infection, site not specified: Secondary | ICD-10-CM | POA: Diagnosis not present

## 2019-07-22 DIAGNOSIS — Z23 Encounter for immunization: Secondary | ICD-10-CM | POA: Diagnosis not present

## 2019-07-22 DIAGNOSIS — M6281 Muscle weakness (generalized): Secondary | ICD-10-CM | POA: Diagnosis not present

## 2019-07-22 DIAGNOSIS — Z008 Encounter for other general examination: Secondary | ICD-10-CM | POA: Diagnosis not present

## 2019-07-22 DIAGNOSIS — H8110 Benign paroxysmal vertigo, unspecified ear: Secondary | ICD-10-CM | POA: Diagnosis not present

## 2019-07-22 DIAGNOSIS — R0600 Dyspnea, unspecified: Secondary | ICD-10-CM | POA: Diagnosis not present

## 2019-07-22 DIAGNOSIS — M8580 Other specified disorders of bone density and structure, unspecified site: Secondary | ICD-10-CM | POA: Diagnosis not present

## 2019-07-22 DIAGNOSIS — K219 Gastro-esophageal reflux disease without esophagitis: Secondary | ICD-10-CM | POA: Diagnosis not present

## 2019-07-22 DIAGNOSIS — Z1389 Encounter for screening for other disorder: Secondary | ICD-10-CM | POA: Diagnosis not present

## 2019-07-22 DIAGNOSIS — Z8719 Personal history of other diseases of the digestive system: Secondary | ICD-10-CM | POA: Diagnosis not present

## 2019-07-22 DIAGNOSIS — R5381 Other malaise: Secondary | ICD-10-CM | POA: Diagnosis not present

## 2019-07-22 DIAGNOSIS — M1990 Unspecified osteoarthritis, unspecified site: Secondary | ICD-10-CM | POA: Diagnosis not present

## 2019-07-22 DIAGNOSIS — M546 Pain in thoracic spine: Secondary | ICD-10-CM | POA: Diagnosis not present

## 2019-07-22 DIAGNOSIS — R269 Unspecified abnormalities of gait and mobility: Secondary | ICD-10-CM | POA: Diagnosis not present

## 2019-07-22 DIAGNOSIS — W19XXXA Unspecified fall, initial encounter: Secondary | ICD-10-CM | POA: Diagnosis not present

## 2019-07-22 DIAGNOSIS — M545 Low back pain: Secondary | ICD-10-CM | POA: Diagnosis not present

## 2019-07-24 DIAGNOSIS — R2681 Unsteadiness on feet: Secondary | ICD-10-CM | POA: Diagnosis not present

## 2019-07-24 DIAGNOSIS — Z1389 Encounter for screening for other disorder: Secondary | ICD-10-CM | POA: Diagnosis not present

## 2019-07-24 DIAGNOSIS — M810 Age-related osteoporosis without current pathological fracture: Secondary | ICD-10-CM | POA: Diagnosis not present

## 2019-07-24 DIAGNOSIS — H8110 Benign paroxysmal vertigo, unspecified ear: Secondary | ICD-10-CM | POA: Diagnosis not present

## 2019-07-24 DIAGNOSIS — R0781 Pleurodynia: Secondary | ICD-10-CM | POA: Diagnosis not present

## 2019-07-24 DIAGNOSIS — M546 Pain in thoracic spine: Secondary | ICD-10-CM | POA: Diagnosis not present

## 2019-07-24 DIAGNOSIS — I10 Essential (primary) hypertension: Secondary | ICD-10-CM | POA: Diagnosis not present

## 2019-07-24 DIAGNOSIS — N390 Urinary tract infection, site not specified: Secondary | ICD-10-CM | POA: Diagnosis not present

## 2019-07-24 DIAGNOSIS — Z0000 Encounter for general adult medical examination without abnormal findings: Secondary | ICD-10-CM | POA: Diagnosis not present

## 2019-07-24 DIAGNOSIS — M6281 Muscle weakness (generalized): Secondary | ICD-10-CM | POA: Diagnosis not present

## 2019-07-24 DIAGNOSIS — K219 Gastro-esophageal reflux disease without esophagitis: Secondary | ICD-10-CM | POA: Diagnosis not present

## 2019-07-24 DIAGNOSIS — M545 Low back pain: Secondary | ICD-10-CM | POA: Diagnosis not present

## 2019-07-24 DIAGNOSIS — Z8719 Personal history of other diseases of the digestive system: Secondary | ICD-10-CM | POA: Diagnosis not present

## 2019-07-24 DIAGNOSIS — Z008 Encounter for other general examination: Secondary | ICD-10-CM | POA: Diagnosis not present

## 2019-07-24 DIAGNOSIS — H9190 Unspecified hearing loss, unspecified ear: Secondary | ICD-10-CM | POA: Diagnosis not present

## 2019-07-24 DIAGNOSIS — R5381 Other malaise: Secondary | ICD-10-CM | POA: Diagnosis not present

## 2019-07-24 DIAGNOSIS — M5380 Other specified dorsopathies, site unspecified: Secondary | ICD-10-CM | POA: Diagnosis not present

## 2019-07-24 DIAGNOSIS — R0600 Dyspnea, unspecified: Secondary | ICD-10-CM | POA: Diagnosis not present

## 2019-07-24 DIAGNOSIS — M8580 Other specified disorders of bone density and structure, unspecified site: Secondary | ICD-10-CM | POA: Diagnosis not present

## 2019-07-24 DIAGNOSIS — R269 Unspecified abnormalities of gait and mobility: Secondary | ICD-10-CM | POA: Diagnosis not present

## 2019-07-24 DIAGNOSIS — M1990 Unspecified osteoarthritis, unspecified site: Secondary | ICD-10-CM | POA: Diagnosis not present

## 2019-07-24 DIAGNOSIS — Z23 Encounter for immunization: Secondary | ICD-10-CM | POA: Diagnosis not present

## 2019-07-24 DIAGNOSIS — W19XXXA Unspecified fall, initial encounter: Secondary | ICD-10-CM | POA: Diagnosis not present

## 2019-07-25 DIAGNOSIS — R269 Unspecified abnormalities of gait and mobility: Secondary | ICD-10-CM | POA: Diagnosis not present

## 2019-07-25 DIAGNOSIS — M6281 Muscle weakness (generalized): Secondary | ICD-10-CM | POA: Diagnosis not present

## 2019-07-25 DIAGNOSIS — N390 Urinary tract infection, site not specified: Secondary | ICD-10-CM | POA: Diagnosis not present

## 2019-07-25 DIAGNOSIS — I10 Essential (primary) hypertension: Secondary | ICD-10-CM | POA: Diagnosis not present

## 2019-07-25 DIAGNOSIS — R5381 Other malaise: Secondary | ICD-10-CM | POA: Diagnosis not present

## 2019-07-25 DIAGNOSIS — K219 Gastro-esophageal reflux disease without esophagitis: Secondary | ICD-10-CM | POA: Diagnosis not present

## 2019-07-25 DIAGNOSIS — M1990 Unspecified osteoarthritis, unspecified site: Secondary | ICD-10-CM | POA: Diagnosis not present

## 2019-07-25 DIAGNOSIS — M8580 Other specified disorders of bone density and structure, unspecified site: Secondary | ICD-10-CM | POA: Diagnosis not present

## 2019-07-25 DIAGNOSIS — Z0000 Encounter for general adult medical examination without abnormal findings: Secondary | ICD-10-CM | POA: Diagnosis not present

## 2019-07-25 DIAGNOSIS — R0781 Pleurodynia: Secondary | ICD-10-CM | POA: Diagnosis not present

## 2019-07-25 DIAGNOSIS — M546 Pain in thoracic spine: Secondary | ICD-10-CM | POA: Diagnosis not present

## 2019-07-25 DIAGNOSIS — M545 Low back pain: Secondary | ICD-10-CM | POA: Diagnosis not present

## 2019-07-25 DIAGNOSIS — R2681 Unsteadiness on feet: Secondary | ICD-10-CM | POA: Diagnosis not present

## 2019-07-25 DIAGNOSIS — H8110 Benign paroxysmal vertigo, unspecified ear: Secondary | ICD-10-CM | POA: Diagnosis not present

## 2019-07-25 DIAGNOSIS — W19XXXA Unspecified fall, initial encounter: Secondary | ICD-10-CM | POA: Diagnosis not present

## 2019-07-25 DIAGNOSIS — Z008 Encounter for other general examination: Secondary | ICD-10-CM | POA: Diagnosis not present

## 2019-07-25 DIAGNOSIS — Z8719 Personal history of other diseases of the digestive system: Secondary | ICD-10-CM | POA: Diagnosis not present

## 2019-07-25 DIAGNOSIS — R0600 Dyspnea, unspecified: Secondary | ICD-10-CM | POA: Diagnosis not present

## 2019-07-25 DIAGNOSIS — H9190 Unspecified hearing loss, unspecified ear: Secondary | ICD-10-CM | POA: Diagnosis not present

## 2019-07-25 DIAGNOSIS — Z23 Encounter for immunization: Secondary | ICD-10-CM | POA: Diagnosis not present

## 2019-07-25 DIAGNOSIS — Z1389 Encounter for screening for other disorder: Secondary | ICD-10-CM | POA: Diagnosis not present

## 2019-07-25 DIAGNOSIS — M810 Age-related osteoporosis without current pathological fracture: Secondary | ICD-10-CM | POA: Diagnosis not present

## 2019-07-25 DIAGNOSIS — M5380 Other specified dorsopathies, site unspecified: Secondary | ICD-10-CM | POA: Diagnosis not present

## 2019-07-30 ENCOUNTER — Non-Acute Institutional Stay: Payer: Medicare Other | Admitting: Internal Medicine

## 2019-07-30 ENCOUNTER — Encounter: Payer: Self-pay | Admitting: Internal Medicine

## 2019-07-30 DIAGNOSIS — I10 Essential (primary) hypertension: Secondary | ICD-10-CM | POA: Diagnosis not present

## 2019-07-30 DIAGNOSIS — Z008 Encounter for other general examination: Secondary | ICD-10-CM | POA: Diagnosis not present

## 2019-07-30 DIAGNOSIS — D638 Anemia in other chronic diseases classified elsewhere: Secondary | ICD-10-CM

## 2019-07-30 DIAGNOSIS — Z8719 Personal history of other diseases of the digestive system: Secondary | ICD-10-CM | POA: Diagnosis not present

## 2019-07-30 DIAGNOSIS — R0600 Dyspnea, unspecified: Secondary | ICD-10-CM | POA: Diagnosis not present

## 2019-07-30 DIAGNOSIS — M8580 Other specified disorders of bone density and structure, unspecified site: Secondary | ICD-10-CM | POA: Diagnosis not present

## 2019-07-30 DIAGNOSIS — Z23 Encounter for immunization: Secondary | ICD-10-CM | POA: Diagnosis not present

## 2019-07-30 DIAGNOSIS — E782 Mixed hyperlipidemia: Secondary | ICD-10-CM

## 2019-07-30 DIAGNOSIS — R269 Unspecified abnormalities of gait and mobility: Secondary | ICD-10-CM | POA: Diagnosis not present

## 2019-07-30 DIAGNOSIS — M810 Age-related osteoporosis without current pathological fracture: Secondary | ICD-10-CM | POA: Diagnosis not present

## 2019-07-30 DIAGNOSIS — D61818 Other pancytopenia: Secondary | ICD-10-CM | POA: Diagnosis not present

## 2019-07-30 DIAGNOSIS — M545 Low back pain: Secondary | ICD-10-CM | POA: Diagnosis not present

## 2019-07-30 DIAGNOSIS — N390 Urinary tract infection, site not specified: Secondary | ICD-10-CM | POA: Diagnosis not present

## 2019-07-30 DIAGNOSIS — M6281 Muscle weakness (generalized): Secondary | ICD-10-CM | POA: Diagnosis not present

## 2019-07-30 DIAGNOSIS — M546 Pain in thoracic spine: Secondary | ICD-10-CM | POA: Diagnosis not present

## 2019-07-30 DIAGNOSIS — E785 Hyperlipidemia, unspecified: Secondary | ICD-10-CM | POA: Diagnosis not present

## 2019-07-30 DIAGNOSIS — G8929 Other chronic pain: Secondary | ICD-10-CM

## 2019-07-30 DIAGNOSIS — S32020A Wedge compression fracture of second lumbar vertebra, initial encounter for closed fracture: Secondary | ICD-10-CM

## 2019-07-30 DIAGNOSIS — H8110 Benign paroxysmal vertigo, unspecified ear: Secondary | ICD-10-CM | POA: Diagnosis not present

## 2019-07-30 DIAGNOSIS — E039 Hypothyroidism, unspecified: Secondary | ICD-10-CM

## 2019-07-30 DIAGNOSIS — M5380 Other specified dorsopathies, site unspecified: Secondary | ICD-10-CM | POA: Diagnosis not present

## 2019-07-30 DIAGNOSIS — M549 Dorsalgia, unspecified: Secondary | ICD-10-CM

## 2019-07-30 DIAGNOSIS — N183 Chronic kidney disease, stage 3 unspecified: Secondary | ICD-10-CM

## 2019-07-30 DIAGNOSIS — M1990 Unspecified osteoarthritis, unspecified site: Secondary | ICD-10-CM | POA: Diagnosis not present

## 2019-07-30 DIAGNOSIS — Z0000 Encounter for general adult medical examination without abnormal findings: Secondary | ICD-10-CM | POA: Diagnosis not present

## 2019-07-30 DIAGNOSIS — M8000XG Age-related osteoporosis with current pathological fracture, unspecified site, subsequent encounter for fracture with delayed healing: Secondary | ICD-10-CM

## 2019-07-30 DIAGNOSIS — W19XXXA Unspecified fall, initial encounter: Secondary | ICD-10-CM | POA: Diagnosis not present

## 2019-07-30 DIAGNOSIS — K219 Gastro-esophageal reflux disease without esophagitis: Secondary | ICD-10-CM | POA: Diagnosis not present

## 2019-07-30 DIAGNOSIS — F321 Major depressive disorder, single episode, moderate: Secondary | ICD-10-CM

## 2019-07-30 DIAGNOSIS — R0781 Pleurodynia: Secondary | ICD-10-CM | POA: Diagnosis not present

## 2019-07-30 DIAGNOSIS — Z1389 Encounter for screening for other disorder: Secondary | ICD-10-CM | POA: Diagnosis not present

## 2019-07-30 DIAGNOSIS — H9190 Unspecified hearing loss, unspecified ear: Secondary | ICD-10-CM | POA: Diagnosis not present

## 2019-07-30 DIAGNOSIS — R5381 Other malaise: Secondary | ICD-10-CM | POA: Diagnosis not present

## 2019-07-30 DIAGNOSIS — R2681 Unsteadiness on feet: Secondary | ICD-10-CM | POA: Diagnosis not present

## 2019-07-30 NOTE — Progress Notes (Signed)
Location:   [place] Room Number: [place]:  ALF [phone]) Provider:  Virgie Dad, MD  Virgie Dad, MD  Patient Care Team: Virgie Dad, MD as PCP - General (Internal Medicine) Mast, Man X, NP as Nurse Practitioner (Internal Medicine)  Extended Emergency Contact Information Primary Emergency Contact: Mcelhiney,[name] Address: [address]          [place], [place] [address] [place] Phone: [phone] Mobile Phone: [phone] Relation: Son Secondary Emergency Contact: Bryant,[name]  [place] Phone: [phone] Relation: None  Code Status:  DNR Goals of care: Advanced Directive information Advanced Directives 05/15/2019  Does Patient Have a Medical Advance Directive? Yes  Type of Advance Directive Out of facility DNR (pink MOST or yellow form)  Does patient want to make changes to medical advance directive? No - Patient declined  Copy of [place] in Chart? -  Pre-existing out of facility DNR order (yellow form or pink MOST form) Yellow form placed in chart (order not valid for inpatient use)     Chief Complaint  Patient presents with  . Medical Management of Chronic Issues  . Health Maintenance     PCV13, TDAP    HPI:  Pt is a 86 y.o. female seen today for medical management of chronic diseases.   Patient has h/o Chronic Back Pain, Hypertension, Hypothyroidism, Hypertension, Recurrent Cystitis, Osteoporosis and Major Depression Resident of AL  Doing well in AL now Back pain better. Working with therapy Walks with [employer]. Her Only complain today was some discomfort in Left UQ Says going on for many weeks. No Nausea or Vomiting. Appetite is good Weight is stable. She denied any Constipation   Past Medical History:  Diagnosis Date  . Anemia   . Anxiety   . Chronotropic incompetence 12/2012    potentially medication related; noted on CPET   . DDD (degenerative disc disease)    . Eczema   . GERD (gastroesophageal reflux disease)   . H/O hiatal hernia   . Hx: UTI (urinary tract infection)   . Hyperlipidemia   . Hypertension   . Hypothyroidism   . Osteopenia   . Septicemia ([place]) 2002   following UTI  . Vertigo, benign positional    Past Surgical History:  Procedure Laterality Date  . BREAST SURGERY     left biopsy  . CATARACT EXTRACTION Right    2 weeks ago  . CPET / MET - PFTS     Consistent with chronotropic incompetence; See attached report in the results section  . DILATION AND CURETTAGE OF UTERUS     x 2  . DOPPLER ECHOCARDIOGRAPHY  01/10/2013   Normal LV size and function. Normal EF. Air sclerosis but no stenosis  . ESOPHAGOGASTRODUODENOSCOPY  05/04/2012   Procedure: ESOPHAGOGASTRODUODENOSCOPY (EGD);  Surgeon: Inda Castle, MD;  Location: [facility];  Service: Endoscopy;  Laterality: N/A;  . EYE SURGERY     cataract extraction with ILO  ? eye  . IR KYPHO EA ADDL LEVEL THORACIC OR LUMBAR  10/28/2016  . IR KYPHO LUMBAR INC FX REDUCE BONE BX UNI/BIL CANNULATION INC/IMAGING  10/28/2016  . IR KYPHO THORACIC WITH BONE BIOPSY  12/24/2016  . IR RADIOLOGIST EVAL & MGMT  11/11/2016  . KNEE ARTHROSCOPY  2011   right  . TONSILLECTOMY    . TOTAL KNEE ARTHROPLASTY  04/24/2012   Procedure: TOTAL KNEE ARTHROPLASTY;  Surgeon: Gearlean Alf, MD;  Location: [facility];  Service: Orthopedics;  Laterality: Right;  . TRANSTHORACIC ECHOCARDIOGRAM  02/2018   Ordered by PCP for "congestive heart failure " -EF 60 M 65%.  GR 1 DD.  No R WMA--- > ESSENTIALLY NORMAL    No Known Allergies  Allergies as of 07/30/2019   No Known Allergies     Medication List       Accurate as of July 30, 2019  1:18 PM. If you have any questions, ask your nurse or doctor.        acetaminophen 500 MG tablet Commonly known as: TYLENOL Take by mouth 2 (two) times a day. [phone] mg   ALPRAZolam 0.25 MG tablet Commonly known as: XANAX Take 1 tablet (0.25 mg total) by mouth at  bedtime.   amLODipine 5 MG tablet Commonly known as: NORVASC Take 5 mg by mouth daily. What changed: Another medication with the same name was removed. Continue taking this medication, and follow the directions you see here. Changed by: Virgie Dad, MD   atorvastatin 10 MG tablet Commonly known as: LIPITOR Take 5 mg by mouth daily.   buPROPion 150 MG 12 hr tablet Commonly known as: WELLBUTRIN SR Take 1 tablet (150 mg total) by mouth 2 (two) times daily after a meal.   Camphor-Menthol-Methyl Sal 3.07-31-08 % Ptch Place 1 patch onto the skin. Apply one to lower back daily for back pain   CITRACAL +D3 PO Take 2 capsules by mouth 2 (two) times a day. 65m-25mcg(1000u)   docusate sodium 100 MG capsule Commonly known as: COLACE Take 100 mg by mouth 2 (two) times daily.   Fiber Powd Take 15 mLs by mouth daily. Mix with 4-8 oz of water   fish oil-omega-3 fatty acids 1000 MG capsule Take 1 g by mouth daily.   gabapentin 100 MG capsule Commonly known as: NEURONTIN Take 100 mg by mouth 3 (three) times daily.   hydrALAZINE 25 MG tablet Commonly known as: APRESOLINE Take 1 tablet (25 mg total) by mouth 2 (two) times daily.   Klor-Con M20 20 MEQ tablet Generic drug: potassium chloride SA TAKE 1 TABLET BY MOUTH TWICE A DAY   levothyroxine 75 MCG tablet Commonly known as: SYNTHROID Take 1 tablet (75 mcg total) by mouth daily before breakfast.   losartan 100 MG tablet Commonly known as: COZAAR Take 1 tablet (100 mg total) by mouth daily.   methocarbamol 500 MG tablet Commonly known as: ROBAXIN Take 250 mg by mouth daily. For muscle spasms   nitrofurantoin 50 MG capsule Commonly known as: MACRODANTIN Take 1 capsule (50 mg total) by mouth daily.   nystatin powder Commonly known as: MYCOSTATIN/NYSTOP Apply 100,000 g topically 2 (two) times daily.   omeprazole 40 MG capsule Commonly known as: PRILOSEC Take 40 mg by mouth daily.   polyethylene glycol 17 g packet  Commonly known as: MIRALAX / GLYCOLAX Take 17 g by mouth daily. For constipation   sertraline 25 MG tablet Commonly known as: ZOLOFT Take 25 mg by mouth daily.   torsemide 20 MG tablet Commonly known as: DEMADEX TAKE 1 TABLET BY MOUTH TWICE A DAY   traMADol 50 MG tablet Commonly known as: ULTRAM Take by mouth daily.   traMADol 50 MG tablet Commonly known as: ULTRAM Take one tablet by mouth twice daily       Review of Systems  Constitutional: Negative for activity change.  HENT: Negative.   Respiratory: Negative.   Cardiovascular: Positive for leg swelling.  Gastrointestinal: Positive for  abdominal pain.  Genitourinary: Negative.   Musculoskeletal: Positive for back pain and gait problem.  Neurological: Positive for weakness.  Psychiatric/Behavioral: Positive for dysphoric mood.  All other systems reviewed and are negative.   Immunization History  Administered Date(s) Administered  . Influenza, High Dose Seasonal PF 04/26/2016, 03/22/2017  . Influenza,inj,Quad PF,6+ Mos 04/27/2018  . Influenza-Unspecified 04/22/2011, 04/04/2012, 05/03/2013, 05/04/2013, 04/17/2014, 04/19/2015, 04/26/2016, 04/22/2017  . Pneumococcal Polysaccharide-23 08/26/2003, 09/06/2011  . Pneumococcal-Unspecified 12/20/2013  . Td 09/26/2006  . Zoster 10/25/2007  . Zoster Recombinat (Shingrix) 11/21/2017, 04/08/2018   Pertinent  Health Maintenance Due  Topic Date Due  . PNA vac Low Risk Adult (2 of 2 - PCV13) 12/21/2014  . INFLUENZA VACCINE  Completed  . DEXA SCAN  Completed   Fall Risk  10/18/2018 08/23/2018 05/09/2018 05/01/2018 04/10/2018  Falls in the past year? 0 0 Yes Yes No  Comment - - - fell in June/2019 _0  Home (asst living) -  Number falls in past yr: 0 0 1 1 -  Injury with Fall? 0 0 No No -  Risk for fall due to : - - - Impaired balance/gait -  Follow up - - - Falls prevention discussed -   Functional Status Survey:    Vitals:   07/30/19 1142  BP: 110/64  Pulse: 68   Resp: 18  Temp: (!) 97.1 F (36.2 C)  SpO2: 93%  Weight: 180 lb 6.4 oz (81.8 kg)  Height: _1  (1.626 m)   Body mass index is 30.97 kg/m. Physical Exam Vitals reviewed.  HENT:     Head: Normocephalic.     Nose: Nose normal.     Mouth/Throat:     Mouth: Mucous membranes are moist.  Eyes:     Pupils: Pupils are equal, round, and reactive to light.  Cardiovascular:     Rate and Rhythm: Normal rate and regular rhythm.     Heart sounds: No murmur.  Pulmonary:     Effort: Pulmonary effort is normal.     Breath sounds: Normal breath sounds.  Abdominal:     General: Abdomen is flat. Bowel sounds are normal. There is no distension.     Palpations: Abdomen is soft. There is no mass.     Tenderness: There is no abdominal tenderness.  Musculoskeletal:     Cervical back: Neck supple.     Comments: Mild Edema Bilateral  Skin:    General: Skin is warm and dry.  Neurological:     General: No focal deficit present.     Mental Status: She is alert and oriented to person, place, and time.  Psychiatric:        Mood and Affect: Mood normal.     Labs reviewed: Recent Labs    08/07/18 0745 11/23/18 0000 01/04/19 0000  NA 139 134* 138  K 4.2 4.1 3.9  CL 100 96*  --   CO2 26 31  --   GLUCOSE 90 88  --   BUN 25 27* 28*  CREATININE 1.04* 1.30* 1.2*  CALCIUM 10.5* 10.2  --    Recent Labs    08/07/18 0745 11/23/18 0000 01/04/19 0000  AST _2 ALT _3 BILITOT 0.3 0.3  --   PROT 7.6 7.6  --    Recent Labs    08/07/18 0745 09/21/18 0815 11/23/18 0000 01/04/19 0000 01/19/19 0000  WBC 3.1* 2.9* 3.6*  --  3.6  NEUTROABS 1,494* 1,441* 2,048  --   --  HGB 10.5* 10.1* 10.7* 9.3* 8.9*  HCT 32.4* 30.6* 32.8* 29* 28*  MCV 86.9 86.0 85.4  --   --   PLT 79* 83* 88* 89* 74*   Lab Results  Component Value Date   TSH 2.90 11/23/2018   No results found for: HGBA1C Lab Results  Component Value Date   CHOL 227 (H) 11/23/2018   HDL 60 11/23/2018   LDLCALC 141 (H)  11/23/2018   TRIG 140 11/23/2018   CHOLHDL 3.8 11/23/2018    Significant Diagnostic Results in last 30 days:  No results found.  Assessment/Plan Essential hypertension Doing well on Norvasc, Cozaar and Hydralazine Acquired hypothyroidism TSH Normal in 4/20  Pancytopenia ([place]) No more work up by [employer] Repeat CBC  Chronic back pain, Doing well Tramadol and Robaxin  Stage 3 chronic kidney disease Repeat BMP  Depression, major, single episode, moderate ([place]) Has been stable on Wellbutrin Mixed hyperlipidemia On Lipitor LDL was 83 on Labs done in 10/20  Age-related osteoporosis Wants DEXA scan . Had refused before  Abdominal Pain NoN specific Will get labs CBC  CMP and Lipase  LE edema Doing good on Demadex Recurrent Cystitis Continue On Nitrofurantoin   Family/ staff Communication:   Labs/tests ordered:    Total time spent in this patient care encounter was  40_  minutes; greater than 50% of the visit spent counseling patient and staff, reviewing records , Labs and coordinating care for problems addressed at this encounter.

## 2019-07-31 DIAGNOSIS — H8110 Benign paroxysmal vertigo, unspecified ear: Secondary | ICD-10-CM | POA: Diagnosis not present

## 2019-07-31 DIAGNOSIS — Z1389 Encounter for screening for other disorder: Secondary | ICD-10-CM | POA: Diagnosis not present

## 2019-07-31 DIAGNOSIS — M810 Age-related osteoporosis without current pathological fracture: Secondary | ICD-10-CM | POA: Diagnosis not present

## 2019-07-31 DIAGNOSIS — M1990 Unspecified osteoarthritis, unspecified site: Secondary | ICD-10-CM | POA: Diagnosis not present

## 2019-07-31 DIAGNOSIS — H9190 Unspecified hearing loss, unspecified ear: Secondary | ICD-10-CM | POA: Diagnosis not present

## 2019-07-31 DIAGNOSIS — Z8719 Personal history of other diseases of the digestive system: Secondary | ICD-10-CM | POA: Diagnosis not present

## 2019-07-31 DIAGNOSIS — R0600 Dyspnea, unspecified: Secondary | ICD-10-CM | POA: Diagnosis not present

## 2019-07-31 DIAGNOSIS — N390 Urinary tract infection, site not specified: Secondary | ICD-10-CM | POA: Diagnosis not present

## 2019-07-31 DIAGNOSIS — M545 Low back pain: Secondary | ICD-10-CM | POA: Diagnosis not present

## 2019-07-31 DIAGNOSIS — I10 Essential (primary) hypertension: Secondary | ICD-10-CM | POA: Diagnosis not present

## 2019-07-31 DIAGNOSIS — M8580 Other specified disorders of bone density and structure, unspecified site: Secondary | ICD-10-CM | POA: Diagnosis not present

## 2019-07-31 DIAGNOSIS — Z0000 Encounter for general adult medical examination without abnormal findings: Secondary | ICD-10-CM | POA: Diagnosis not present

## 2019-07-31 DIAGNOSIS — Z23 Encounter for immunization: Secondary | ICD-10-CM | POA: Diagnosis not present

## 2019-07-31 DIAGNOSIS — R269 Unspecified abnormalities of gait and mobility: Secondary | ICD-10-CM | POA: Diagnosis not present

## 2019-07-31 DIAGNOSIS — R0781 Pleurodynia: Secondary | ICD-10-CM | POA: Diagnosis not present

## 2019-07-31 DIAGNOSIS — M6281 Muscle weakness (generalized): Secondary | ICD-10-CM | POA: Diagnosis not present

## 2019-07-31 DIAGNOSIS — E785 Hyperlipidemia, unspecified: Secondary | ICD-10-CM | POA: Diagnosis not present

## 2019-07-31 DIAGNOSIS — M5380 Other specified dorsopathies, site unspecified: Secondary | ICD-10-CM | POA: Diagnosis not present

## 2019-07-31 DIAGNOSIS — R5381 Other malaise: Secondary | ICD-10-CM | POA: Diagnosis not present

## 2019-07-31 DIAGNOSIS — M546 Pain in thoracic spine: Secondary | ICD-10-CM | POA: Diagnosis not present

## 2019-07-31 DIAGNOSIS — W19XXXA Unspecified fall, initial encounter: Secondary | ICD-10-CM | POA: Diagnosis not present

## 2019-07-31 DIAGNOSIS — R2681 Unsteadiness on feet: Secondary | ICD-10-CM | POA: Diagnosis not present

## 2019-07-31 DIAGNOSIS — Z008 Encounter for other general examination: Secondary | ICD-10-CM | POA: Diagnosis not present

## 2019-07-31 DIAGNOSIS — K219 Gastro-esophageal reflux disease without esophagitis: Secondary | ICD-10-CM | POA: Diagnosis not present

## 2019-08-01 ENCOUNTER — Other Ambulatory Visit: Payer: Self-pay | Admitting: *Deleted

## 2019-08-01 MED ORDER — TRAMADOL HCL 50 MG PO TABS: ORAL_TABLET | ORAL | 0 refills | Status: DC

## 2019-08-01 NOTE — Telephone Encounter (Signed)
Received fax from [facility] Pended Rx and sent to Dr. Gupta for approval.  

## 2019-08-02 ENCOUNTER — Non-Acute Institutional Stay: Payer: Medicare Other | Admitting: Nurse Practitioner

## 2019-08-02 ENCOUNTER — Encounter: Payer: Self-pay | Admitting: Nurse Practitioner

## 2019-08-02 ENCOUNTER — Other Ambulatory Visit: Payer: Self-pay | Admitting: *Deleted

## 2019-08-02 DIAGNOSIS — K219 Gastro-esophageal reflux disease without esophagitis: Secondary | ICD-10-CM | POA: Diagnosis not present

## 2019-08-02 DIAGNOSIS — Z1389 Encounter for screening for other disorder: Secondary | ICD-10-CM | POA: Diagnosis not present

## 2019-08-02 DIAGNOSIS — Z0000 Encounter for general adult medical examination without abnormal findings: Secondary | ICD-10-CM | POA: Diagnosis not present

## 2019-08-02 DIAGNOSIS — R0600 Dyspnea, unspecified: Secondary | ICD-10-CM | POA: Diagnosis not present

## 2019-08-02 DIAGNOSIS — M545 Low back pain: Secondary | ICD-10-CM | POA: Diagnosis not present

## 2019-08-02 DIAGNOSIS — N390 Urinary tract infection, site not specified: Secondary | ICD-10-CM | POA: Diagnosis not present

## 2019-08-02 DIAGNOSIS — M1990 Unspecified osteoarthritis, unspecified site: Secondary | ICD-10-CM | POA: Diagnosis not present

## 2019-08-02 DIAGNOSIS — R0781 Pleurodynia: Secondary | ICD-10-CM | POA: Diagnosis not present

## 2019-08-02 DIAGNOSIS — H9190 Unspecified hearing loss, unspecified ear: Secondary | ICD-10-CM | POA: Diagnosis not present

## 2019-08-02 DIAGNOSIS — E785 Hyperlipidemia, unspecified: Secondary | ICD-10-CM | POA: Diagnosis not present

## 2019-08-02 DIAGNOSIS — K5909 Other constipation: Secondary | ICD-10-CM | POA: Diagnosis not present

## 2019-08-02 DIAGNOSIS — R748 Abnormal levels of other serum enzymes: Secondary | ICD-10-CM | POA: Diagnosis not present

## 2019-08-02 DIAGNOSIS — D649 Anemia, unspecified: Secondary | ICD-10-CM | POA: Diagnosis not present

## 2019-08-02 DIAGNOSIS — I739 Peripheral vascular disease, unspecified: Secondary | ICD-10-CM

## 2019-08-02 DIAGNOSIS — H8110 Benign paroxysmal vertigo, unspecified ear: Secondary | ICD-10-CM | POA: Diagnosis not present

## 2019-08-02 DIAGNOSIS — Z8719 Personal history of other diseases of the digestive system: Secondary | ICD-10-CM | POA: Diagnosis not present

## 2019-08-02 DIAGNOSIS — M8580 Other specified disorders of bone density and structure, unspecified site: Secondary | ICD-10-CM | POA: Diagnosis not present

## 2019-08-02 DIAGNOSIS — I10 Essential (primary) hypertension: Secondary | ICD-10-CM | POA: Diagnosis not present

## 2019-08-02 DIAGNOSIS — M5380 Other specified dorsopathies, site unspecified: Secondary | ICD-10-CM | POA: Diagnosis not present

## 2019-08-02 DIAGNOSIS — M546 Pain in thoracic spine: Secondary | ICD-10-CM | POA: Diagnosis not present

## 2019-08-02 DIAGNOSIS — R269 Unspecified abnormalities of gait and mobility: Secondary | ICD-10-CM | POA: Diagnosis not present

## 2019-08-02 DIAGNOSIS — M6281 Muscle weakness (generalized): Secondary | ICD-10-CM | POA: Diagnosis not present

## 2019-08-02 DIAGNOSIS — M810 Age-related osteoporosis without current pathological fracture: Secondary | ICD-10-CM | POA: Diagnosis not present

## 2019-08-02 DIAGNOSIS — Z23 Encounter for immunization: Secondary | ICD-10-CM | POA: Diagnosis not present

## 2019-08-02 DIAGNOSIS — R2681 Unsteadiness on feet: Secondary | ICD-10-CM | POA: Diagnosis not present

## 2019-08-02 DIAGNOSIS — R5381 Other malaise: Secondary | ICD-10-CM | POA: Diagnosis not present

## 2019-08-02 DIAGNOSIS — Z008 Encounter for other general examination: Secondary | ICD-10-CM | POA: Diagnosis not present

## 2019-08-02 DIAGNOSIS — W19XXXA Unspecified fall, initial encounter: Secondary | ICD-10-CM | POA: Diagnosis not present

## 2019-08-02 MED ORDER — ALPRAZOLAM 0.25 MG PO TABS: 0.2500 mg | ORAL_TABLET | Freq: Every day | ORAL | 0 refills | Status: DC

## 2019-08-02 MED ORDER — TRAMADOL HCL 50 MG PO TABS: ORAL_TABLET | ORAL | 0 refills | Status: DC

## 2019-08-02 NOTE — Assessment & Plan Note (Addendum)
Low Bp measurements: 106/50, 108/57, 80s/? 30s, HR in 56 bpm upon my examination, will dc Amlodipine, continue Losartan, Hydralazine. VS q shift x 72 hours.

## 2019-08-02 NOTE — Assessment & Plan Note (Signed)
Will reduce MiraLax to qod in setting of occasional loose stools. Observe.

## 2019-08-02 NOTE — Assessment & Plan Note (Signed)
Chronic edema BLE, no open wounds, continue Torsemide 20mg  bid.

## 2019-08-02 NOTE — Assessment & Plan Note (Signed)
Stable, continue Omeprazole.  

## 2019-08-02 NOTE — Progress Notes (Signed)
Location:   [facility] Room Number: 75 Place of Service:  ALF (13) Provider: Lennie Odor  NP  Virgie Dad, MD  Patient Care Team: Virgie Dad, MD as PCP - General (Internal Medicine) ,  X, NP as Nurse Practitioner (Internal Medicine)  Extended Emergency Contact Information Primary Emergency Contact: Daigle,[name] Address: [address]          [place], [place] [address] [place] Phone: [phone] Mobile Phone: [phone] Relation: Son Secondary Emergency Contact: Sforza,[name]  [place] Phone: [phone] Relation: None  Code Status:  DNR Goals of care: Advanced Directive information Advanced Directives 05/15/2019  Does Patient Have a Medical Advance Directive? Yes  Type of Advance Directive Out of facility DNR (pink MOST or yellow form)  Does patient want to make changes to medical advance directive? No - Patient declined  Copy of [place] in Chart? -  Pre-existing out of facility DNR order (yellow form or pink MOST form) Yellow form placed in chart (order not valid for inpatient use)     Chief Complaint  Patient presents with  . Acute Visit    Low blood pressure    HPI:  Pt is a 86 y.o. female seen today for an acute visit for reported low blood pressure measurements, in 80s/50s, on Amlodipine, Losartan, Hydralazine.The patient admitted feeling tired, but denied dizziness, headache, change of vision, chest pain/pressrue or palpitation . Her appetite is at her baseline, denied nausea, vomiting, or abd pain. Reported loose stools this morning, taking Fiber, MiraLax, Colace, Prune juice. Hx of chronic edema BLE, on Torsemide 30m bid. GERD, stable, on Omeprazole 473mqd.    Past Medical History:  Diagnosis Date  . Anemia   . Anxiety   . Chronotropic incompetence 12/2012    potentially medication related; noted on CPET   . DDD (degenerative disc disease)   . Eczema   . GERD  (gastroesophageal reflux disease)   . H/O hiatal hernia   . Hx: UTI (urinary tract infection)   . Hyperlipidemia   . Hypertension   . Hypothyroidism   . Osteopenia   . [facility]icemia ([facility]   following UTI  . Vertigo, benign positional    Past Surgical History:  Procedure Laterality Date  . BREAST SURGERY     left biopsy  . CATARACT EXTRACTION Right    2 weeks ago  . CPET / MET - PFTS     Consistent with chronotropic incompetence; See attached report in the results section  . DILATION AND CURETTAGE OF UTERUS     x 2  . DOPPLER ECHOCARDIOGRAPHY  01/10/2013   Normal LV size and function. Normal EF. Air sclerosis but no stenosis  . ESOPHAGOGASTRODUODENOSCOPY  05/04/2012   Procedure: ESOPHAGOGASTRODUODENOSCOPY (EGD);  Surgeon: RoInda CastleMD;  [facility];  Service: Endoscopy;  Laterality: N/A;  . EYE SURGERY     cataract extraction with ILO  ? eye  . IR KYPHO EA ADDL LEVEL THORACIC OR LUMBAR  10/28/2016  . IR KYPHO LUMBAR INC FX REDUCE BONE BX UNI/BIL CANNULATION INC/IMAGING  10/28/2016  . IR KYPHO THORACIC WITH BONE BIOPSY  12/24/2016  . IR RADIOLOGIST EVAL & MGMT  11/11/2016  . KNEE ARTHROSCOPY  2011   right  . TONSILLECTOMY    . TOTAL KNEE ARTHROPLASTY  04/24/2012   Procedure: TOTAL KNEE ARTHROPLASTY;  Surgeon: FrGearlean AlfMD;  [facility];  Service: Orthopedics;  Laterality: Right;  .  TRANSTHORACIC ECHOCARDIOGRAM  02/2018   Ordered by PCP for "congestive heart failure " -EF 60 M 65%.  GR 1 DD.  No R WMA--- > ESSENTIALLY NORMAL    No Known Allergies  Allergies as of 08/02/2019   No Known Allergies     Medication List       Accurate as of August 02, 2019  1:32 PM. If you have any questions, ask your nurse or doctor.        STOP taking these medications   amLODipine 5 MG tablet Commonly known as: NORVASC Stopped by:  X , NP     TAKE these medications   acetaminophen 500 MG tablet Commonly known as: TYLENOL Take by mouth 2 (two) times  a day. [phone] mg   TYLENOL 500 MG tablet Generic drug: acetaminophen Take 1,000 mg by mouth 2 (two) times daily.   ALPRAZolam 0.25 MG tablet Commonly known as: XANAX Take 1 tablet (0.25 mg total) by mouth at bedtime.   atorvastatin 10 MG tablet Commonly known as: LIPITOR Take 5 mg by mouth daily.   buPROPion 150 MG 12 hr tablet Commonly known as: WELLBUTRIN SR Take 1 tablet (150 mg total) by mouth 2 (two) times daily after a meal.   Camphor-Menthol-Methyl Sal 3.07-31-08 % Ptch Place 1 patch onto the skin. Apply one to lower back daily for back pain   CITRACAL +D3 PO Take 2 capsules by mouth 2 (two) times a day. 654m-25mcg(1000u)   docusate sodium 100 MG capsule Commonly known as: COLACE Take 100 mg by mouth 2 (two) times daily.   Fiber Powd Take 15 mLs by mouth daily. Mix with 4-8 oz of water   fish oil-omega-3 fatty acids 1000 MG capsule Take 1 g by mouth daily.   gabapentin 100 MG capsule Commonly known as: NEURONTIN Take 100 mg by mouth 3 (three) times daily.   hydrALAZINE 25 MG tablet Commonly known as: APRESOLINE Take 1 tablet (25 mg total) by mouth 2 (two) times daily.   Klor-Con M20 20 MEQ tablet Generic drug: potassium chloride SA TAKE 1 TABLET BY MOUTH TWICE A DAY   levothyroxine 75 MCG tablet Commonly known as: SYNTHROID Take 1 tablet (75 mcg total) by mouth daily before breakfast.   losartan 100 MG tablet Commonly known as: COZAAR Take 1 tablet (100 mg total) by mouth daily.   methocarbamol 500 MG tablet Commonly known as: ROBAXIN Take 250 mg by mouth daily. For muscle spasms   nitrofurantoin 50 MG capsule Commonly known as: MACRODANTIN Take 1 capsule (50 mg total) by mouth daily.   nystatin powder Commonly known as: MYCOSTATIN/NYSTOP Apply 100,000 g topically 2 (two) times daily.   omeprazole 40 MG capsule Commonly known as: PRILOSEC Take 40 mg by mouth daily.   polyethylene glycol 17 g packet Commonly known as: MIRALAX /  GLYCOLAX Take 17 g by mouth daily. For constipation   sertraline 25 MG tablet Commonly known as: ZOLOFT Take 25 mg by mouth daily.   torsemide 20 MG tablet Commonly known as: DEMADEX TAKE 1 TABLET BY MOUTH TWICE A DAY   traMADol 50 MG tablet Commonly known as: ULTRAM Take one tablet by mouth twice daily   traMADol 50 MG tablet Commonly known as: ULTRAM Take one tablet by mouth every evening scheduled.      ROS was provided with assistance of staff.  Review of Systems  Constitutional: Positive for fatigue. Negative for activity change, appetite change, chills, diaphoresis and fever.  HENT: Positive for hearing  loss. Negative for congestion and voice change.   Eyes: Negative for visual disturbance.  Respiratory: Negative for cough, shortness of breath and wheezing.   Cardiovascular: Positive for leg swelling. Negative for chest pain and palpitations.  Gastrointestinal: Negative for abdominal distention, abdominal pain, constipation, diarrhea, nausea and vomiting.       Loose stools  Genitourinary: Negative for difficulty urinating, dysuria and urgency.  Musculoskeletal: Positive for arthralgias, back pain and gait problem.  Skin: Negative for color change and pallor.  Neurological: Negative for dizziness, speech difficulty, weakness and headaches.       Memory lapses.   Psychiatric/Behavioral: Negative for agitation, behavioral problems, hallucinations and sleep disturbance. The patient is not nervous/anxious.     Immunization History  Administered Date(s) Administered  . Influenza, High Dose Seasonal PF 04/26/2016, 03/22/2017  . Influenza,inj,Quad PF,6+ Mos 04/27/2018  . Influenza-Unspecified 04/22/2011, 04/04/2012, 05/03/2013, 05/04/2013, 04/17/2014, 04/19/2015, 04/26/2016, 04/22/2017  . Pneumococcal Polysaccharide-23 08/26/2003, 09/06/2011  . Pneumococcal-Unspecified 12/20/2013  . Td 09/26/2006  . Zoster 10/25/2007  . Zoster Recombinat (Shingrix) 11/21/2017, 04/08/2018    Pertinent  Health Maintenance Due  Topic Date Due  . PNA vac Low Risk Adult (2 of 2 - PCV13) 12/21/2014  . INFLUENZA VACCINE  Completed  . DEXA SCAN  Completed   Fall Risk  10/18/2018 08/23/2018 05/09/2018 05/01/2018 04/10/2018  Falls in the past year? 0 0 Yes Yes No  Comment - - - fell in June/2019 _0  Home (asst living) -  Number falls in past yr: 0 0 1 1 -  Injury with Fall? 0 0 No No -  Risk for fall due to : - - - Impaired balance/gait -  Follow up - - - Falls prevention discussed -   Functional Status Survey:    Vitals:   08/02/19 1315  BP: (!) 88/39  Pulse: (!) 56  Temp: 97.6 F (36.4 C)  SpO2: 96%  Weight: 180 lb 6.4 oz (81.8 kg)  Height: 5' 4" (1.626 m)   Body mass index is 30.97 kg/m. Physical Exam Vitals and nursing note reviewed.  Constitutional:      General: She is not in acute distress.    Appearance: Normal appearance. She is not ill-appearing, toxic-appearing or diaphoretic.  HENT:     Head: Normocephalic and atraumatic.     Nose: Nose normal.     Mouth/Throat:     Mouth: Mucous membranes are moist.  Eyes:     Extraocular Movements: Extraocular movements intact.     Conjunctiva/sclera: Conjunctivae normal.     Pupils: Pupils are equal, round, and reactive to light.  Cardiovascular:     Rate and Rhythm: Normal rate and regular rhythm.     Heart sounds: No murmur.  Pulmonary:     Breath sounds: Rales present.     Comments: Bibasilar rales.  Abdominal:     General: Bowel sounds are normal. There is no distension.     Palpations: Abdomen is soft.     Tenderness: There is no abdominal tenderness. There is no right CVA tenderness, left CVA tenderness, guarding or rebound.  Musculoskeletal:     Cervical back: Normal range of motion and neck supple.     Right lower leg: Edema present.     Left lower leg: Edema present.     Comments: 1+ edema BLE  Skin:    General: Skin is warm and dry.     Findings: Erythema present.  Neurological:      General: No focal deficit present.  Mental Status: She is alert. Mental status is at baseline.     Motor: No weakness.     Coordination: Coordination normal.     Gait: Gait abnormal.     Comments: Oriented to person, place.   Psychiatric:        Mood and Affect: Mood normal.        Behavior: Behavior normal.        Thought Content: Thought content normal.        Judgment: Judgment normal.     Labs reviewed: Recent Labs    08/07/18 0745 11/23/18 0000 01/04/19 0000  NA 139 134* 138  K 4.2 4.1 3.9  CL 100 96*  --   CO2 26 31  --   GLUCOSE 90 88  --   BUN 25 27* 28*  CREATININE 1.04* 1.30* 1.2*  CALCIUM 10.5* 10.2  --    Recent Labs    08/07/18 0745 11/23/18 0000 01/04/19 0000  AST _0 ALT _1 BILITOT 0.3 0.3  --   PROT 7.6 7.6  --    Recent Labs    08/07/18 0745 09/21/18 0815 11/23/18 0000 01/04/19 0000 01/19/19 0000  WBC 3.1* 2.9* 3.6*  --  3.6  NEUTROABS 1,494* 1,441* 2,048  --   --   HGB 10.5* 10.1* 10.7* 9.3* 8.9*  HCT 32.4* 30.6* 32.8* 29* 28*  MCV 86.9 86.0 85.4  --   --   PLT 79* 83* 88* 89* 74*   Lab Results  Component Value Date   TSH 2.90 11/23/2018   No results found for: HGBA1C Lab Results  Component Value Date   CHOL 227 (H) 11/23/2018   HDL 60 11/23/2018   LDLCALC 141 (H) 11/23/2018   TRIG 140 11/23/2018   CHOLHDL 3.8 11/23/2018    Significant Diagnostic Results in last 30 days:  No results found.  Assessment/Plan Essential hypertension Low Bp measurements: 106/50, 108/57, 80s/? 30s, HR in 56 bpm upon my examination, will dc Amlodipine, continue Losartan, Hydralazine. VS q shift x 72 hours.   PVD (peripheral vascular disease) (HCC) Chronic edema BLE, no open wounds, continue Torsemide 50m bid.   Chronic constipation Will reduce MiraLax to qod in setting of occasional loose stools. Observe.   GERD (gastroesophageal reflux disease) Stable, continue Omeprazole.     Family/ staff Communication: plan of care  reviewed with he patient and charge nurse.   Labs/tests ordered:  Pending CBC, CMP, lipase.   Time spend 40 minutes.

## 2019-08-02 NOTE — Telephone Encounter (Signed)
Received fax refill request (Tramadol sent to wrong pharmacy on 1/6) RePended Pended Rx and sent to Dr. [name] for approval.

## 2019-08-03 ENCOUNTER — Non-Acute Institutional Stay: Payer: Medicare Other | Admitting: Nurse Practitioner

## 2019-08-03 ENCOUNTER — Encounter: Payer: Self-pay | Admitting: Nurse Practitioner

## 2019-08-03 DIAGNOSIS — D638 Anemia in other chronic diseases classified elsewhere: Secondary | ICD-10-CM | POA: Diagnosis not present

## 2019-08-03 DIAGNOSIS — N183 Chronic kidney disease, stage 3 unspecified: Secondary | ICD-10-CM | POA: Diagnosis not present

## 2019-08-03 DIAGNOSIS — N3090 Cystitis, unspecified without hematuria: Secondary | ICD-10-CM

## 2019-08-03 DIAGNOSIS — I739 Peripheral vascular disease, unspecified: Secondary | ICD-10-CM

## 2019-08-03 DIAGNOSIS — K5909 Other constipation: Secondary | ICD-10-CM

## 2019-08-03 DIAGNOSIS — K219 Gastro-esophageal reflux disease without esophagitis: Secondary | ICD-10-CM

## 2019-08-03 DIAGNOSIS — I10 Essential (primary) hypertension: Secondary | ICD-10-CM | POA: Diagnosis not present

## 2019-08-03 NOTE — Progress Notes (Signed)
Location:   [facility] Room Number: 39 Place of Service:  ALF (13) Provider:  Marilee Ditommaso NP  Virgie Dad, MD  Patient Care Team: Virgie Dad, MD as PCP - General (Internal Medicine) Dream Nodal X, NP as Nurse Practitioner (Internal Medicine)  Extended Emergency Contact Information Primary Emergency Contact: Spirito,[name] Address: [address]          [place], [place] [address] [place] Phone: [phone] Mobile Phone: [phone] Relation: Son Secondary Emergency Contact: Flemister,[name]  [place] Phone: [phone] Relation: None  Code Status:  DNR Goals of care: Advanced Directive information Advanced Directives 05/15/2019  Does Patient Have a Medical Advance Directive? Yes  Type of Advance Directive Out of facility DNR (pink MOST or yellow form)  Does patient want to make changes to medical advance directive? No - Patient declined  Copy of [place] in Chart? -  Pre-existing out of facility DNR order (yellow form or pink MOST form) Yellow form placed in chart (order not valid for inpatient use)     Chief Complaint  Patient presents with  . Acute Visit    Acute renal injury on CKD, anemia, dropping Hgb 8 08/02/19     HPI:  Pt is a 86 y.o. female seen today for an acute visit for dropping Hgb 8.0 08/02/19 from 9.0 05/10/19, the patient denied blood in urine, stool, or bloody emesis, occasional abd pain, but not today. Hx of anemia, chronic disease in nature. CKD, creat has  trended up 1.83 1/721 from 1.2 05/10/19. Hx of chronic edema BLE, stable, on Torsemide 21m bid. Constipation, stable, on MIraLax qod, Colace bid, Fiber qd, . GERD, stable, on Omeprazole 492mqd. UTI suppression, on Nitrofurantoinin 5057md. HTN, blood pressure is controlled on Losartan 100m29m, Hydralazine 25mg63m.    Past Medical History:  Diagnosis Date  . Anemia   . Anxiety   . Chronotropic incompetence 12/2012   potentially medication related; noted on CPET   . DDD (degenerative disc disease)   . Eczema   . GERD (gastroesophageal reflux disease)   . H/O hiatal hernia   . Hx: UTI (urinary tract infection)   . Hyperlipidemia   . Hypertension   . Hypothyroidism   . Osteopenia   . Septicemia (HCC) [place]   following UTI  . Vertigo, benign positional    Past Surgical History:  Procedure Laterality Date  . BREAST SURGERY     left biopsy  . CATARACT EXTRACTION Right    2 weeks ago  . CPET / MET - PFTS     Consistent with chronotropic incompetence; See attached report in the results section  . DILATION AND CURETTAGE OF UTERUS     x 2  . DOPPLER ECHOCARDIOGRAPHY  01/10/2013   Normal LV size and function. Normal EF. Air sclerosis but no stenosis  . ESOPHAGOGASTRODUODENOSCOPY  05/04/2012   Procedure: ESOPHAGOGASTRODUODENOSCOPY (EGD);  Surgeon: RoberInda Castle  [facility];  Service: Endoscopy;  Laterality: N/A;  . EYE SURGERY     cataract extraction with ILO  ? eye  . IR KYPHO EA ADDL LEVEL THORACIC OR LUMBAR  10/28/2016  . IR KYPHO LUMBAR INC FX REDUCE BONE BX UNI/BIL CANNULATION INC/IMAGING  10/28/2016  . IR KYPHO THORACIC WITH BONE BIOPSY  12/24/2016  . IR RADIOLOGIST EVAL & MGMT  11/11/2016  . KNEE ARTHROSCOPY  2011   right  . TONSILLECTOMY    .  TOTAL KNEE ARTHROPLASTY  04/24/2012   Procedure: TOTAL KNEE ARTHROPLASTY;  Surgeon: Gearlean Alf, MD;  [facility];  Service: Orthopedics;  Laterality: Right;  . TRANSTHORACIC ECHOCARDIOGRAM  02/2018   Ordered by PCP for "congestive heart failure " -EF 60 M 65%.  GR 1 DD.  No R WMA--- > ESSENTIALLY NORMAL    No Known Allergies  Allergies as of 08/03/2019   No Known Allergies     Medication List       Accurate as of August 03, 2019 11:59 PM. If you have any questions, ask your nurse or doctor.        acetaminophen 500 MG tablet Commonly known as: TYLENOL Take by mouth 2 (two) times a day. [phone] mg   TYLENOL 500 MG  tablet Generic drug: acetaminophen Take 1,000 mg by mouth 2 (two) times daily.   ALPRAZolam 0.25 MG tablet Commonly known as: XANAX Take 1 tablet (0.25 mg total) by mouth at bedtime.   atorvastatin 10 MG tablet Commonly known as: LIPITOR Take 5 mg by mouth daily.   buPROPion 150 MG 12 hr tablet Commonly known as: WELLBUTRIN SR Take 1 tablet (150 mg total) by mouth 2 (two) times daily after a meal.   Camphor-Menthol-Methyl Sal 3.07-31-08 % Ptch Place 1 patch onto the skin. Apply one to lower back daily for back pain   CITRACAL +D3 PO Take 2 capsules by mouth 2 (two) times a day. 651m-25mcg(1000u)   docusate sodium 100 MG capsule Commonly known as: COLACE Take 100 mg by mouth 2 (two) times daily.   Fiber Powd Take 15 mLs by mouth daily. Mix with 4-8 oz of water   fish oil-omega-3 fatty acids 1000 MG capsule Take 1 g by mouth daily.   gabapentin 100 MG capsule Commonly known as: NEURONTIN Take 100 mg by mouth 3 (three) times daily.   hydrALAZINE 25 MG tablet Commonly known as: APRESOLINE Take 1 tablet (25 mg total) by mouth 2 (two) times daily.   Klor-Con M20 20 MEQ tablet Generic drug: potassium chloride SA TAKE 1 TABLET BY MOUTH TWICE A DAY   levothyroxine 75 MCG tablet Commonly known as: SYNTHROID Take 1 tablet (75 mcg total) by mouth daily before breakfast.   losartan 100 MG tablet Commonly known as: COZAAR Take 1 tablet (100 mg total) by mouth daily.   methocarbamol 500 MG tablet Commonly known as: ROBAXIN Take 250 mg by mouth daily. For muscle spasms   nitrofurantoin 50 MG capsule Commonly known as: MACRODANTIN Take 1 capsule (50 mg total) by mouth daily.   nystatin powder Commonly known as: MYCOSTATIN/NYSTOP Apply 100,000 g topically 2 (two) times daily.   omeprazole 40 MG capsule Commonly known as: PRILOSEC Take 40 mg by mouth daily.   polyethylene glycol 17 g packet Commonly known as: MIRALAX / GLYCOLAX Take 17 g by mouth daily. For  constipation   sertraline 25 MG tablet Commonly known as: ZOLOFT Take 25 mg by mouth daily.   torsemide 20 MG tablet Commonly known as: DEMADEX TAKE 1 TABLET BY MOUTH TWICE A DAY   traMADol 50 MG tablet Commonly known as: ULTRAM Take one tablet by mouth twice daily   traMADol 50 MG tablet Commonly known as: ULTRAM Take one tablet by mouth every evening scheduled.       Review of Systems  Constitutional: Negative for activity change, appetite change, chills, diaphoresis, fatigue and fever.  HENT: Positive for hearing loss. Negative for congestion and voice change.   Eyes:  Negative for visual disturbance.  Respiratory: Negative for cough, shortness of breath and wheezing.   Cardiovascular: Positive for leg swelling. Negative for chest pain and palpitations.  Gastrointestinal: Negative for abdominal distention, abdominal pain, blood in stool, constipation, diarrhea, nausea and vomiting.  Genitourinary: Negative for difficulty urinating, dysuria and urgency.  Musculoskeletal: Positive for arthralgias, back pain and gait problem.  Skin: Negative for color change and pallor.  Neurological: Negative for dizziness, speech difficulty, weakness and headaches.  Psychiatric/Behavioral: Negative for agitation, behavioral problems, hallucinations and sleep disturbance. The patient is not nervous/anxious.     Immunization History  Administered Date(s) Administered  . Influenza, High Dose Seasonal PF 04/26/2016, 03/22/2017  . Influenza,inj,Quad PF,6+ Mos 04/27/2018  . Influenza-Unspecified 04/22/2011, 04/04/2012, 05/03/2013, 05/04/2013, 04/17/2014, 04/19/2015, 04/26/2016, 04/22/2017  . Pneumococcal Polysaccharide-23 08/26/2003, 09/06/2011  . Pneumococcal-Unspecified 12/20/2013  . Td 09/26/2006  . Zoster 10/25/2007  . Zoster Recombinat (Shingrix) 11/21/2017, 04/08/2018   Pertinent  Health Maintenance Due  Topic Date Due  . PNA vac Low Risk Adult (2 of 2 - PCV13) 12/21/2014  .  INFLUENZA VACCINE  Completed  . DEXA SCAN  Completed   Fall Risk  10/18/2018 08/23/2018 05/09/2018 05/01/2018 04/10/2018  Falls in the past year? 0 0 Yes Yes No  Comment - - - [facility]/2019 [facility] -  Number falls in past yr: 0 0 1 1 -  Injury with Fall? 0 0 No No -  Risk for fall due to : - - - Impaired balance/gait -  Follow up - - - Falls prevention discussed -   Functional Status Survey:    Vitals:   08/03/19 1511  BP: 124/60  Pulse: 78  Resp: 16  Temp: (!) 97.5 F (36.4 C)  SpO2: 90%  Weight: 180 lb 6.4 oz (81.8 kg)  Height: '5\' 4"'  (1.626 m)   Body mass index is 30.97 kg/m. Physical Exam Vitals and nursing note reviewed.  Constitutional:      General: She is not in acute distress.    Appearance: Normal appearance. She is not ill-appearing, toxic-appearing or diaphoretic.  HENT:     Head: Normocephalic and atraumatic.     Nose: Nose normal.     Mouth/Throat:     Mouth: Mucous membranes are moist.  Eyes:     Extraocular Movements: Extraocular movements intact.     Conjunctiva/sclera: Conjunctivae normal.     Pupils: Pupils are equal, round, and reactive to light.  Cardiovascular:     Rate and Rhythm: Normal rate and regular rhythm.     Heart sounds: No murmur.  Pulmonary:     Effort: Pulmonary effort is normal.     Breath sounds: Rales present. No wheezing or rhonchi.     Comments: Bibasilar rales.  Abdominal:     General: Bowel sounds are normal. There is no distension.     Palpations: Abdomen is soft.     Tenderness: There is no abdominal tenderness. There is no right CVA tenderness, left CVA tenderness, guarding or rebound.  Musculoskeletal:     Cervical back: Normal range of motion and neck supple.     Right lower leg: Edema present.     Left lower leg: Edema present.     Comments: 1+ edema BLE  Skin:    General: Skin is warm and dry.  Neurological:     General: No focal deficit present.     Mental Status: She is alert. Mental  status is at baseline.  Cranial Nerves: No cranial nerve deficit.     Motor: No weakness.     Coordination: Coordination normal.     Gait: Gait abnormal.     Comments: Oriented to person, place.   Psychiatric:        Mood and Affect: Mood normal.        Behavior: Behavior normal.        Thought Content: Thought content normal.     Labs reviewed: Recent Labs    08/07/18 0745 11/23/18 0000 01/04/19 0000  NA 139 134* 138  K 4.2 4.1 3.9  CL 100 96*  --   CO2 26 31  --   GLUCOSE 90 88  --   BUN 25 27* 28*  CREATININE 1.04* 1.30* 1.2*  CALCIUM 10.5* 10.2  --    Recent Labs    08/07/18 0745 11/23/18 0000 01/04/19 0000  AST '30 29 21  ' ALT '22 22 17  ' BILITOT 0.3 0.3  --   PROT 7.6 7.6  --    Recent Labs    08/07/18 0745 09/21/18 0815 11/23/18 0000 01/04/19 0000 01/19/19 0000  WBC 3.1* 2.9* 3.6*  --  3.6  NEUTROABS 1,494* 1,441* 2,048  --   --   HGB 10.5* 10.1* 10.7* 9.3* 8.9*  HCT 32.4* 30.6* 32.8* 29* 28*  MCV 86.9 86.0 85.4  --   --   PLT 79* 83* 88* 89* 74*   Lab Results  Component Value Date   TSH 2.90 11/23/2018   No results found for: HGBA1C Lab Results  Component Value Date   CHOL 227 (H) 11/23/2018   HDL 60 11/23/2018   LDLCALC 141 (H) 11/23/2018   TRIG 140 11/23/2018   CHOLHDL 3.8 11/23/2018    Significant Diagnostic Results in last 30 days:  No results found.  Assessment/Plan CKD (chronic kidney disease) stage 3, GFR 30-59 ml/min 08/02/19 Na 139, K 4.2, Bun 48, creat 1.83, eGFR 24, wbc 3.7, Hgb 8.0, plt 56, neutrophils 51.1 08/03/19 decrease Torsemide 49m qd, repeat CMP/eGFR next wk.  Anemia of chronic disease 08/02/19 wbc 3.7, Hgb 8.0, plt 56, neutrophils 51.1 08/03/19 repeat CBC/diff, Iron, Fe sat, TIBC, ferritin, Retic count, FOBT, Vit B12, Folate.   Essential hypertension Blood pressure is controlled, continue Losartan, Hydralazine.   PVD (peripheral vascular disease) ([place] Persisted, will decrease Torsemide to 213mqd in setting of  trended up creat from 1.2 to 1.8 in the past 2 months. Observe.   Chronic constipation Stable, continue MiraLax, Colace, Fiber.   GERD (gastroesophageal reflux disease) Stable, continue Omeprazole.   Recurrent cystitis On Nitrofurantoin 5061md for UTI suppression therapy.      Family/ staff Communication: plan of care reviewed with the patient and charge nurse.   Labs/tests ordered:  CMP/eGFR, CBC/diff, Iron, Fe sat, TIBC, ferritin, Retic count, FOBT, Vit B12, Folate.   Time spend 40 minutes.

## 2019-08-05 ENCOUNTER — Encounter: Payer: Self-pay | Admitting: Nurse Practitioner

## 2019-08-05 NOTE — Assessment & Plan Note (Signed)
On Nitrofurantoin 50mg  qd for UTI suppression therapy.

## 2019-08-05 NOTE — Assessment & Plan Note (Signed)
Stable, continue MiraLax, Colace, Fiber.

## 2019-08-05 NOTE — Assessment & Plan Note (Signed)
Blood pressure is controlled, continue Losartan, Hydralazine.  

## 2019-08-05 NOTE — Assessment & Plan Note (Signed)
08/02/19 Na 139, K 4.2, Bun 48, creat 1.83, eGFR 24, wbc 3.7, Hgb 8.0, plt 56, neutrophils 51.1 08/03/19 decrease Torsemide 10m qd, repeat CMP/eGFR next wk.

## 2019-08-05 NOTE — Assessment & Plan Note (Signed)
Persisted, will decrease Torsemide to 20mg  qd in setting of trended up creat from 1.2 to 1.8 in the past 2 months. Observe.

## 2019-08-05 NOTE — Assessment & Plan Note (Signed)
08/02/19 wbc 3.7, Hgb 8.0, plt 56, neutrophils 51.1 08/03/19 repeat CBC/diff, Iron, Fe sat, TIBC, ferritin, Retic count, FOBT, Vit B12, Folate.

## 2019-08-05 NOTE — Assessment & Plan Note (Signed)
Stable, continue Omeprazole.  

## 2019-08-06 ENCOUNTER — Non-Acute Institutional Stay: Payer: Medicare Other | Admitting: Internal Medicine

## 2019-08-06 ENCOUNTER — Encounter: Payer: Self-pay | Admitting: Internal Medicine

## 2019-08-06 ENCOUNTER — Other Ambulatory Visit: Payer: Self-pay | Admitting: *Deleted

## 2019-08-06 DIAGNOSIS — H1033 Unspecified acute conjunctivitis, bilateral: Secondary | ICD-10-CM | POA: Diagnosis not present

## 2019-08-06 DIAGNOSIS — F418 Other specified anxiety disorders: Secondary | ICD-10-CM

## 2019-08-06 DIAGNOSIS — R5381 Other malaise: Secondary | ICD-10-CM | POA: Diagnosis not present

## 2019-08-06 DIAGNOSIS — I10 Essential (primary) hypertension: Secondary | ICD-10-CM | POA: Diagnosis not present

## 2019-08-06 DIAGNOSIS — N390 Urinary tract infection, site not specified: Secondary | ICD-10-CM | POA: Diagnosis not present

## 2019-08-06 DIAGNOSIS — M8580 Other specified disorders of bone density and structure, unspecified site: Secondary | ICD-10-CM | POA: Diagnosis not present

## 2019-08-06 DIAGNOSIS — M810 Age-related osteoporosis without current pathological fracture: Secondary | ICD-10-CM | POA: Diagnosis not present

## 2019-08-06 DIAGNOSIS — D649 Anemia, unspecified: Secondary | ICD-10-CM | POA: Diagnosis not present

## 2019-08-06 DIAGNOSIS — W19XXXA Unspecified fall, initial encounter: Secondary | ICD-10-CM | POA: Diagnosis not present

## 2019-08-06 DIAGNOSIS — H8110 Benign paroxysmal vertigo, unspecified ear: Secondary | ICD-10-CM | POA: Diagnosis not present

## 2019-08-06 DIAGNOSIS — E785 Hyperlipidemia, unspecified: Secondary | ICD-10-CM | POA: Diagnosis not present

## 2019-08-06 DIAGNOSIS — N183 Chronic kidney disease, stage 3 unspecified: Secondary | ICD-10-CM

## 2019-08-06 DIAGNOSIS — K219 Gastro-esophageal reflux disease without esophagitis: Secondary | ICD-10-CM | POA: Diagnosis not present

## 2019-08-06 DIAGNOSIS — M545 Low back pain: Secondary | ICD-10-CM | POA: Diagnosis not present

## 2019-08-06 DIAGNOSIS — H9190 Unspecified hearing loss, unspecified ear: Secondary | ICD-10-CM | POA: Diagnosis not present

## 2019-08-06 DIAGNOSIS — Z79899 Other long term (current) drug therapy: Secondary | ICD-10-CM | POA: Diagnosis not present

## 2019-08-06 DIAGNOSIS — M546 Pain in thoracic spine: Secondary | ICD-10-CM | POA: Diagnosis not present

## 2019-08-06 DIAGNOSIS — D510 Vitamin B12 deficiency anemia due to intrinsic factor deficiency: Secondary | ICD-10-CM | POA: Diagnosis not present

## 2019-08-06 DIAGNOSIS — Z23 Encounter for immunization: Secondary | ICD-10-CM | POA: Diagnosis not present

## 2019-08-06 DIAGNOSIS — M5380 Other specified dorsopathies, site unspecified: Secondary | ICD-10-CM | POA: Diagnosis not present

## 2019-08-06 DIAGNOSIS — Z8719 Personal history of other diseases of the digestive system: Secondary | ICD-10-CM | POA: Diagnosis not present

## 2019-08-06 DIAGNOSIS — Z008 Encounter for other general examination: Secondary | ICD-10-CM | POA: Diagnosis not present

## 2019-08-06 DIAGNOSIS — R2681 Unsteadiness on feet: Secondary | ICD-10-CM | POA: Diagnosis not present

## 2019-08-06 DIAGNOSIS — R0781 Pleurodynia: Secondary | ICD-10-CM | POA: Diagnosis not present

## 2019-08-06 DIAGNOSIS — R0600 Dyspnea, unspecified: Secondary | ICD-10-CM | POA: Diagnosis not present

## 2019-08-06 DIAGNOSIS — R197 Diarrhea, unspecified: Secondary | ICD-10-CM | POA: Diagnosis not present

## 2019-08-06 DIAGNOSIS — N19 Unspecified kidney failure: Secondary | ICD-10-CM | POA: Diagnosis not present

## 2019-08-06 DIAGNOSIS — D61818 Other pancytopenia: Secondary | ICD-10-CM

## 2019-08-06 DIAGNOSIS — R269 Unspecified abnormalities of gait and mobility: Secondary | ICD-10-CM | POA: Diagnosis not present

## 2019-08-06 DIAGNOSIS — M6281 Muscle weakness (generalized): Secondary | ICD-10-CM | POA: Diagnosis not present

## 2019-08-06 DIAGNOSIS — M1990 Unspecified osteoarthritis, unspecified site: Secondary | ICD-10-CM | POA: Diagnosis not present

## 2019-08-06 DIAGNOSIS — Z0000 Encounter for general adult medical examination without abnormal findings: Secondary | ICD-10-CM | POA: Diagnosis not present

## 2019-08-06 DIAGNOSIS — D509 Iron deficiency anemia, unspecified: Secondary | ICD-10-CM | POA: Diagnosis not present

## 2019-08-06 DIAGNOSIS — Z1389 Encounter for screening for other disorder: Secondary | ICD-10-CM | POA: Diagnosis not present

## 2019-08-06 LAB — IRON,TIBC AND FERRITIN PANEL: Ferritin: 158

## 2019-08-06 NOTE — Progress Notes (Signed)
Location:    [facility] Room Number: [place]:  [phone]) Provider: Virgie Dad, MD  Virgie Dad, MD  Patient Care Team: Virgie Dad, MD as PCP - General (Internal Medicine) Mast, Man X, NP as Nurse Practitioner (Internal Medicine)  Extended Emergency Contact Information Primary Emergency Contact: Bauer,[name] Address: [address]          [place], [place] [address] [place] Phone: [phone] Mobile Phone: [phone] Relation: Son Secondary Emergency Contact: Barnick,[name]  [place] Phone: [phone] Relation: None  Code Status:  DNR Goals of care: Advanced Directive information Advanced Directives 05/15/2019  Does Patient Have a Medical Advance Directive? Yes  Type of Advance Directive Out of facility DNR (pink MOST or yellow form)  Does patient want to make changes to medical advance directive? No - Patient declined  Copy of [place] in Chart? -  Pre-existing out of facility DNR order (yellow form or pink MOST form) Yellow form placed in chart (order not valid for inpatient use)     Chief Complaint  Patient presents with  . Acute Visit    Eye irritation     HPI:  Pt is a 86 y.o. female seen today for an acute visit for Eye Redness with Discharge. Also c/o Diarrhea and Weakness  Patient has h/o Chronic Back Pain, Hypertension, Hypothyroidism, Hypertension, Recurrent Cystitis, Osteoporosis and Major Depression Resident of AL  Eye Irritation C/o Redness and Discharge with Itching. Crusty eyes in the Morning C/o Diarrhea for Past Few days Going to bathroom Many times. Watery Stool. Did not want to give sample to Nurses  Left Lower Quadrant Pain but not related to Diarrhea NO Blood in Stools  No Abdominal Cramps. No Nocturnal Diarrhea Pancytopenia Worsening with Hgb of 8 and Platelets of 55 Has Myeloproliferative disorder and Per hematology there is not much  options for her for treatment Depression and Anxiety Crying and Very upset today Past Medical History:  Diagnosis Date  . Anemia   . Anxiety   . Chronotropic incompetence 12/2012    potentially medication related; noted on CPET   . DDD (degenerative disc disease)   . Eczema   . GERD (gastroesophageal reflux disease)   . H/O hiatal hernia   . Hx: UTI (urinary tract infection)   . Hyperlipidemia   . Hypertension   . Hypothyroidism   . Osteopenia   . Septicemia ([place]) 2002   following UTI  . Vertigo, benign positional    Past Surgical History:  Procedure Laterality Date  . BREAST SURGERY     left biopsy  . CATARACT EXTRACTION Right    2 weeks ago  . CPET / MET - PFTS     Consistent with chronotropic incompetence; See attached report in the results section  . DILATION AND CURETTAGE OF UTERUS     x 2  . DOPPLER ECHOCARDIOGRAPHY  01/10/2013   Normal LV size and function. Normal EF. Air sclerosis but no stenosis  . ESOPHAGOGASTRODUODENOSCOPY  05/04/2012   Procedure: ESOPHAGOGASTRODUODENOSCOPY (EGD);  Surgeon: Inda Castle, MD;  Location: [facility];  Service: Endoscopy;  Laterality: N/A;  . EYE SURGERY     cataract extraction with ILO  ? eye  . IR KYPHO EA ADDL LEVEL THORACIC OR LUMBAR  10/28/2016  . IR KYPHO LUMBAR INC FX REDUCE BONE BX UNI/BIL CANNULATION INC/IMAGING  10/28/2016  . IR KYPHO THORACIC WITH BONE BIOPSY  12/24/2016  .  IR RADIOLOGIST EVAL & MGMT  11/11/2016  . KNEE ARTHROSCOPY  2011   right  . TONSILLECTOMY    . TOTAL KNEE ARTHROPLASTY  04/24/2012   Procedure: TOTAL KNEE ARTHROPLASTY;  Surgeon: Gearlean Alf, MD;  Location: [facility];  Service: Orthopedics;  Laterality: Right;  . TRANSTHORACIC ECHOCARDIOGRAM  02/2018   Ordered by PCP for "congestive heart failure " -EF 60 M 65%.  GR 1 DD.  No R WMA--- > ESSENTIALLY NORMAL    No Known Allergies  Allergies as of 08/06/2019   No Known Allergies     Medication List       Accurate as of August 06, 2019  4:21  PM. If you have any questions, ask your nurse or doctor.        acetaminophen 500 MG tablet Commonly known as: TYLENOL Take by mouth 2 (two) times a day. [phone] mg   TYLENOL 500 MG tablet Generic drug: acetaminophen Take 1,000 mg by mouth 2 (two) times daily.   ALPRAZolam 0.25 MG tablet Commonly known as: XANAX Take 1 tablet (0.25 mg total) by mouth at bedtime.   atorvastatin 10 MG tablet Commonly known as: LIPITOR Take 5 mg by mouth daily.   buPROPion 150 MG 12 hr tablet Commonly known as: WELLBUTRIN SR Take 1 tablet (150 mg total) by mouth 2 (two) times daily after a meal.   Camphor-Menthol-Methyl Sal 3.07-31-08 % Ptch Place 1 patch onto the skin. Apply one to lower back daily for back pain   CITRACAL +D3 PO Take 2 capsules by mouth 2 (two) times a day. 671m-25mcg(1000u)   docusate sodium 100 MG capsule Commonly known as: COLACE Take 100 mg by mouth 2 (two) times daily.   Fiber Powd Take 15 mLs by mouth daily. Mix with 4-8 oz of water   fish oil-omega-3 fatty acids 1000 MG capsule Take 1 g by mouth daily.   gabapentin 100 MG capsule Commonly known as: NEURONTIN Take 100 mg by mouth 3 (three) times daily.   hydrALAZINE 25 MG tablet Commonly known as: APRESOLINE Take 1 tablet (25 mg total) by mouth 2 (two) times daily.   Klor-Con M20 20 MEQ tablet Generic drug: potassium chloride SA TAKE 1 TABLET BY MOUTH TWICE A DAY   levothyroxine 75 MCG tablet Commonly known as: SYNTHROID Take 1 tablet (75 mcg total) by mouth daily before breakfast.   loperamide 2 MG tablet Commonly known as: IMODIUM A-D Take 2 mg by mouth as needed for diarrhea or loose stools.   losartan 100 MG tablet Commonly known as: COZAAR Take 1 tablet (100 mg total) by mouth daily.   methocarbamol 500 MG tablet Commonly known as: ROBAXIN Take 250 mg by mouth daily. For muscle spasms   nitrofurantoin 50 MG capsule Commonly known as: MACRODANTIN Take 1 capsule (50 mg total) by mouth  daily.   nystatin powder Commonly known as: MYCOSTATIN/NYSTOP Apply 100,000 g topically 2 (two) times daily.   omeprazole 40 MG capsule Commonly known as: PRILOSEC Take 40 mg by mouth daily.   polyethylene glycol 17 g packet Commonly known as: MIRALAX / GLYCOLAX Take 17 g by mouth every other day. For constipation   sertraline 25 MG tablet Commonly known as: ZOLOFT Take 25 mg by mouth daily.   torsemide 20 MG tablet Commonly known as: DEMADEX Take 20 mg by mouth daily. What changed: Another medication with the same name was removed. Continue taking this medication, and follow the directions you see here. Changed by: Pegeen Stiger L  Lyndel Safe, MD   traMADol 50 MG tablet Commonly known as: ULTRAM Take one tablet by mouth every evening scheduled.       Review of Systems  Constitutional: Negative.   HENT: Negative.   Respiratory: Negative.   Cardiovascular: Positive for leg swelling.  Gastrointestinal: Positive for abdominal pain and diarrhea.  Genitourinary: Negative.   Musculoskeletal: Positive for back pain.  Skin: Negative.   Neurological: Positive for weakness.  Psychiatric/Behavioral: Positive for dysphoric mood. The patient is nervous/anxious.     Immunization History  Administered Date(s) Administered  . Influenza, High Dose Seasonal PF 04/26/2016, 03/22/2017  . Influenza,inj,Quad PF,6+ Mos 04/27/2018  . Influenza-Unspecified 04/22/2011, 04/04/2012, 05/03/2013, 05/04/2013, 04/17/2014, 04/19/2015, 04/26/2016, 04/22/2017  . Pneumococcal Polysaccharide-23 08/26/2003, 09/06/2011  . Pneumococcal-Unspecified 12/20/2013  . Td 09/26/2006  . Zoster 10/25/2007  . Zoster Recombinat (Shingrix) 11/21/2017, 04/08/2018   Pertinent  Health Maintenance Due  Topic Date Due  . PNA vac Low Risk Adult (2 of 2 - PCV13) 12/21/2014  . INFLUENZA VACCINE  Completed  . DEXA SCAN  Completed   Fall Risk  10/18/2018 08/23/2018 05/09/2018 05/01/2018 04/10/2018  Falls in the past year? 0 0 Yes Yes  No  Comment - - - fell in June/2019 [facility] -  Number falls in past yr: 0 0 1 1 -  Injury with Fall? 0 0 No No -  Risk for fall due to : - - - Impaired balance/gait -  Follow up - - - Falls prevention discussed -   Functional Status Survey:    Vitals:   08/06/19 1442  BP: 132/75  Pulse: 74  Resp: 20  Temp: 98.4 F (36.9 C)  SpO2: 92%  Weight: 180 lb 6.4 oz (81.8 kg)  Height: '5\' 4"'  (1.626 m)   Body mass index is 30.97 kg/m. Physical Exam Vitals reviewed.  Constitutional:      Appearance: She is obese.  HENT:     Head: Normocephalic.     Nose: Nose normal.     Mouth/Throat:     Mouth: Mucous membranes are moist.     Pharynx: Oropharynx is clear.  Eyes:     Pupils: Pupils are equal, round, and reactive to light.     Comments: Mild redness Bilateral. Vision at baseline. No Pain Just discharge  Cardiovascular:     Rate and Rhythm: Normal rate and regular rhythm.  Pulmonary:     Effort: Pulmonary effort is normal.     Breath sounds: Normal breath sounds.  Abdominal:     General: Abdomen is flat. Bowel sounds are normal. There is no distension.     Palpations: Abdomen is soft.     Tenderness: There is no abdominal tenderness. There is no guarding.  Musculoskeletal:        General: Swelling present.     Cervical back: Neck supple.  Skin:    General: Skin is warm.  Neurological:     General: No focal deficit present.     Mental Status: She is alert and oriented to person, place, and time.  Psychiatric:     Comments: Anxious And depressed today     Labs reviewed: Recent Labs    08/07/18 0745 11/23/18 0000 01/04/19 0000  NA 139 134* 138  K 4.2 4.1 3.9  CL 100 96*  --   CO2 26 31  --   GLUCOSE 90 88  --   BUN 25 27* 28*  CREATININE 1.04* 1.30* 1.2*  CALCIUM 10.5* 10.2  --  Recent Labs    08/07/18 0745 11/23/18 0000 01/04/19 0000  AST '30 29 21  ' ALT '22 22 17  ' BILITOT 0.3 0.3  --   PROT 7.6 7.6  --    Recent Labs     08/07/18 0745 09/21/18 0815 11/23/18 0000 01/04/19 0000 01/19/19 0000  WBC 3.1* 2.9* 3.6*  --  3.6  NEUTROABS 1,494* 1,441* 2,048  --   --   HGB 10.5* 10.1* 10.7* 9.3* 8.9*  HCT 32.4* 30.6* 32.8* 29* 28*  MCV 86.9 86.0 85.4  --   --   PLT 79* 83* 88* 89* 74*   Lab Results  Component Value Date   TSH 2.90 11/23/2018   No results found for: HGBA1C Lab Results  Component Value Date   CHOL 227 (H) 11/23/2018   HDL 60 11/23/2018   LDLCALC 141 (H) 11/23/2018   TRIG 140 11/23/2018   CHOLHDL 3.8 11/23/2018    Significant Diagnostic Results in last 30 days:  No results found.  Assessment/Plan Acute bacterial conjunctivitis of both eyes Ocuflox 2 drops QID for 7 Days  Pancytopenia ([place]) As per  Hematology she is not candidate for Bone Marrow Biopsy Continue to monitor and possible Hospice if needed later Weight is stable right now  Diarrhea, unspecified type Got Imodium by Nurses today She is reluctant to give stool sample Will collect sample if Diarrhea not better by tomorrow   Depression with anxiety Very Anxious today Will continue to monitor supportive care On Zoloft and Xanax and Wellbutrin No change in dose today CKD Worsening BUN and Creat on Labs few days ago Demadex Reduced by Manxi Repeat BMP Pending Hypertension Norvasc discontinued  BP stable on Cozaar and Hydralazine    Family/ staff Communication:   Labs/tests ordered:   Total time spent in this patient care encounter was  40_  minutes; greater than 50% of the visit spent counseling patient and staff, reviewing records , Labs and coordinating care for problems addressed at this encounter.

## 2019-08-07 LAB — BASIC METABOLIC PANEL
BUN: 31 — AB (ref 4–21)
CO2: 27 — AB (ref 13–22)
Chloride: 108 (ref 99–108)
Creatinine: 1.3 — AB (ref <=1.1)
Glucose: 77
Potassium: 4.3 (ref 3.4–5.3)
Sodium: 143 (ref 137–147)

## 2019-08-07 LAB — HEPATIC FUNCTION PANEL
ALT: 17 (ref 7–35)
AST: 24 (ref 13–35)
Alkaline Phosphatase: 102 (ref 25–125)
Bilirubin, Total: 0.3

## 2019-08-07 LAB — CBC AND DIFFERENTIAL
HCT: 27 — AB (ref 36–46)
Hemoglobin: 8.2 — AB (ref 12.0–16.0)
Platelets: 59 — AB (ref 150–399)
WBC: 3.3

## 2019-08-07 LAB — VITAMIN B12: Vitamin B-12: 1276

## 2019-08-08 DIAGNOSIS — R2681 Unsteadiness on feet: Secondary | ICD-10-CM | POA: Diagnosis not present

## 2019-08-08 DIAGNOSIS — M6281 Muscle weakness (generalized): Secondary | ICD-10-CM | POA: Diagnosis not present

## 2019-08-08 DIAGNOSIS — Z23 Encounter for immunization: Secondary | ICD-10-CM | POA: Diagnosis not present

## 2019-08-08 DIAGNOSIS — E785 Hyperlipidemia, unspecified: Secondary | ICD-10-CM | POA: Diagnosis not present

## 2019-08-08 DIAGNOSIS — R0600 Dyspnea, unspecified: Secondary | ICD-10-CM | POA: Diagnosis not present

## 2019-08-08 DIAGNOSIS — M545 Low back pain: Secondary | ICD-10-CM | POA: Diagnosis not present

## 2019-08-08 DIAGNOSIS — M8580 Other specified disorders of bone density and structure, unspecified site: Secondary | ICD-10-CM | POA: Diagnosis not present

## 2019-08-08 DIAGNOSIS — R269 Unspecified abnormalities of gait and mobility: Secondary | ICD-10-CM | POA: Diagnosis not present

## 2019-08-08 DIAGNOSIS — H8110 Benign paroxysmal vertigo, unspecified ear: Secondary | ICD-10-CM | POA: Diagnosis not present

## 2019-08-08 DIAGNOSIS — M1990 Unspecified osteoarthritis, unspecified site: Secondary | ICD-10-CM | POA: Diagnosis not present

## 2019-08-08 DIAGNOSIS — R5381 Other malaise: Secondary | ICD-10-CM | POA: Diagnosis not present

## 2019-08-08 DIAGNOSIS — H9190 Unspecified hearing loss, unspecified ear: Secondary | ICD-10-CM | POA: Diagnosis not present

## 2019-08-08 DIAGNOSIS — M546 Pain in thoracic spine: Secondary | ICD-10-CM | POA: Diagnosis not present

## 2019-08-08 DIAGNOSIS — M5380 Other specified dorsopathies, site unspecified: Secondary | ICD-10-CM | POA: Diagnosis not present

## 2019-08-08 DIAGNOSIS — Z008 Encounter for other general examination: Secondary | ICD-10-CM | POA: Diagnosis not present

## 2019-08-08 DIAGNOSIS — I10 Essential (primary) hypertension: Secondary | ICD-10-CM | POA: Diagnosis not present

## 2019-08-08 DIAGNOSIS — W19XXXA Unspecified fall, initial encounter: Secondary | ICD-10-CM | POA: Diagnosis not present

## 2019-08-08 DIAGNOSIS — M810 Age-related osteoporosis without current pathological fracture: Secondary | ICD-10-CM | POA: Diagnosis not present

## 2019-08-08 DIAGNOSIS — N390 Urinary tract infection, site not specified: Secondary | ICD-10-CM | POA: Diagnosis not present

## 2019-08-08 DIAGNOSIS — K219 Gastro-esophageal reflux disease without esophagitis: Secondary | ICD-10-CM | POA: Diagnosis not present

## 2019-08-08 DIAGNOSIS — Z8719 Personal history of other diseases of the digestive system: Secondary | ICD-10-CM | POA: Diagnosis not present

## 2019-08-08 DIAGNOSIS — Z0000 Encounter for general adult medical examination without abnormal findings: Secondary | ICD-10-CM | POA: Diagnosis not present

## 2019-08-08 DIAGNOSIS — Z1389 Encounter for screening for other disorder: Secondary | ICD-10-CM | POA: Diagnosis not present

## 2019-08-08 DIAGNOSIS — R0781 Pleurodynia: Secondary | ICD-10-CM | POA: Diagnosis not present

## 2019-08-12 DIAGNOSIS — W19XXXA Unspecified fall, initial encounter: Secondary | ICD-10-CM | POA: Diagnosis not present

## 2019-08-12 DIAGNOSIS — R2681 Unsteadiness on feet: Secondary | ICD-10-CM | POA: Diagnosis not present

## 2019-08-12 DIAGNOSIS — M810 Age-related osteoporosis without current pathological fracture: Secondary | ICD-10-CM | POA: Diagnosis not present

## 2019-08-12 DIAGNOSIS — M1990 Unspecified osteoarthritis, unspecified site: Secondary | ICD-10-CM | POA: Diagnosis not present

## 2019-08-12 DIAGNOSIS — R0781 Pleurodynia: Secondary | ICD-10-CM | POA: Diagnosis not present

## 2019-08-12 DIAGNOSIS — R0600 Dyspnea, unspecified: Secondary | ICD-10-CM | POA: Diagnosis not present

## 2019-08-12 DIAGNOSIS — Z0000 Encounter for general adult medical examination without abnormal findings: Secondary | ICD-10-CM | POA: Diagnosis not present

## 2019-08-12 DIAGNOSIS — E785 Hyperlipidemia, unspecified: Secondary | ICD-10-CM | POA: Diagnosis not present

## 2019-08-12 DIAGNOSIS — K219 Gastro-esophageal reflux disease without esophagitis: Secondary | ICD-10-CM | POA: Diagnosis not present

## 2019-08-12 DIAGNOSIS — M5380 Other specified dorsopathies, site unspecified: Secondary | ICD-10-CM | POA: Diagnosis not present

## 2019-08-12 DIAGNOSIS — N390 Urinary tract infection, site not specified: Secondary | ICD-10-CM | POA: Diagnosis not present

## 2019-08-12 DIAGNOSIS — Z8719 Personal history of other diseases of the digestive system: Secondary | ICD-10-CM | POA: Diagnosis not present

## 2019-08-12 DIAGNOSIS — R269 Unspecified abnormalities of gait and mobility: Secondary | ICD-10-CM | POA: Diagnosis not present

## 2019-08-12 DIAGNOSIS — R5381 Other malaise: Secondary | ICD-10-CM | POA: Diagnosis not present

## 2019-08-12 DIAGNOSIS — I10 Essential (primary) hypertension: Secondary | ICD-10-CM | POA: Diagnosis not present

## 2019-08-12 DIAGNOSIS — M6281 Muscle weakness (generalized): Secondary | ICD-10-CM | POA: Diagnosis not present

## 2019-08-12 DIAGNOSIS — M545 Low back pain: Secondary | ICD-10-CM | POA: Diagnosis not present

## 2019-08-12 DIAGNOSIS — M8580 Other specified disorders of bone density and structure, unspecified site: Secondary | ICD-10-CM | POA: Diagnosis not present

## 2019-08-12 DIAGNOSIS — Z23 Encounter for immunization: Secondary | ICD-10-CM | POA: Diagnosis not present

## 2019-08-12 DIAGNOSIS — H9190 Unspecified hearing loss, unspecified ear: Secondary | ICD-10-CM | POA: Diagnosis not present

## 2019-08-12 DIAGNOSIS — Z008 Encounter for other general examination: Secondary | ICD-10-CM | POA: Diagnosis not present

## 2019-08-12 DIAGNOSIS — M546 Pain in thoracic spine: Secondary | ICD-10-CM | POA: Diagnosis not present

## 2019-08-12 DIAGNOSIS — Z1389 Encounter for screening for other disorder: Secondary | ICD-10-CM | POA: Diagnosis not present

## 2019-08-12 DIAGNOSIS — H8110 Benign paroxysmal vertigo, unspecified ear: Secondary | ICD-10-CM | POA: Diagnosis not present

## 2019-08-13 DIAGNOSIS — D649 Anemia, unspecified: Secondary | ICD-10-CM | POA: Diagnosis not present

## 2019-08-14 ENCOUNTER — Encounter: Payer: Self-pay | Admitting: Nurse Practitioner

## 2019-08-14 ENCOUNTER — Non-Acute Institutional Stay: Payer: Medicare Other | Admitting: Nurse Practitioner

## 2019-08-14 DIAGNOSIS — M545 Low back pain, unspecified: Secondary | ICD-10-CM

## 2019-08-14 DIAGNOSIS — M5380 Other specified dorsopathies, site unspecified: Secondary | ICD-10-CM | POA: Diagnosis not present

## 2019-08-14 DIAGNOSIS — Z23 Encounter for immunization: Secondary | ICD-10-CM | POA: Diagnosis not present

## 2019-08-14 DIAGNOSIS — Z8719 Personal history of other diseases of the digestive system: Secondary | ICD-10-CM | POA: Diagnosis not present

## 2019-08-14 DIAGNOSIS — R0600 Dyspnea, unspecified: Secondary | ICD-10-CM | POA: Diagnosis not present

## 2019-08-14 DIAGNOSIS — M6281 Muscle weakness (generalized): Secondary | ICD-10-CM | POA: Diagnosis not present

## 2019-08-14 DIAGNOSIS — D638 Anemia in other chronic diseases classified elsewhere: Secondary | ICD-10-CM | POA: Diagnosis not present

## 2019-08-14 DIAGNOSIS — H524 Presbyopia: Secondary | ICD-10-CM | POA: Diagnosis not present

## 2019-08-14 DIAGNOSIS — R269 Unspecified abnormalities of gait and mobility: Secondary | ICD-10-CM | POA: Diagnosis not present

## 2019-08-14 DIAGNOSIS — M810 Age-related osteoporosis without current pathological fracture: Secondary | ICD-10-CM | POA: Diagnosis not present

## 2019-08-14 DIAGNOSIS — I10 Essential (primary) hypertension: Secondary | ICD-10-CM | POA: Diagnosis not present

## 2019-08-14 DIAGNOSIS — H52201 Unspecified astigmatism, right eye: Secondary | ICD-10-CM | POA: Diagnosis not present

## 2019-08-14 DIAGNOSIS — W19XXXA Unspecified fall, initial encounter: Secondary | ICD-10-CM | POA: Diagnosis not present

## 2019-08-14 DIAGNOSIS — Z1389 Encounter for screening for other disorder: Secondary | ICD-10-CM | POA: Diagnosis not present

## 2019-08-14 DIAGNOSIS — G8929 Other chronic pain: Secondary | ICD-10-CM

## 2019-08-14 DIAGNOSIS — E785 Hyperlipidemia, unspecified: Secondary | ICD-10-CM | POA: Diagnosis not present

## 2019-08-14 DIAGNOSIS — N390 Urinary tract infection, site not specified: Secondary | ICD-10-CM | POA: Diagnosis not present

## 2019-08-14 DIAGNOSIS — R0781 Pleurodynia: Secondary | ICD-10-CM | POA: Diagnosis not present

## 2019-08-14 DIAGNOSIS — R5381 Other malaise: Secondary | ICD-10-CM | POA: Diagnosis not present

## 2019-08-14 DIAGNOSIS — R2681 Unsteadiness on feet: Secondary | ICD-10-CM | POA: Diagnosis not present

## 2019-08-14 DIAGNOSIS — H8110 Benign paroxysmal vertigo, unspecified ear: Secondary | ICD-10-CM | POA: Diagnosis not present

## 2019-08-14 DIAGNOSIS — D61818 Other pancytopenia: Secondary | ICD-10-CM | POA: Diagnosis not present

## 2019-08-14 DIAGNOSIS — M546 Pain in thoracic spine: Secondary | ICD-10-CM | POA: Diagnosis not present

## 2019-08-14 DIAGNOSIS — Z0000 Encounter for general adult medical examination without abnormal findings: Secondary | ICD-10-CM | POA: Diagnosis not present

## 2019-08-14 DIAGNOSIS — Z008 Encounter for other general examination: Secondary | ICD-10-CM | POA: Diagnosis not present

## 2019-08-14 DIAGNOSIS — K219 Gastro-esophageal reflux disease without esophagitis: Secondary | ICD-10-CM | POA: Diagnosis not present

## 2019-08-14 DIAGNOSIS — H5211 Myopia, right eye: Secondary | ICD-10-CM | POA: Diagnosis not present

## 2019-08-14 DIAGNOSIS — I739 Peripheral vascular disease, unspecified: Secondary | ICD-10-CM

## 2019-08-14 DIAGNOSIS — Z961 Presence of intraocular lens: Secondary | ICD-10-CM | POA: Diagnosis not present

## 2019-08-14 DIAGNOSIS — M1990 Unspecified osteoarthritis, unspecified site: Secondary | ICD-10-CM | POA: Diagnosis not present

## 2019-08-14 DIAGNOSIS — H9190 Unspecified hearing loss, unspecified ear: Secondary | ICD-10-CM | POA: Diagnosis not present

## 2019-08-14 DIAGNOSIS — M8580 Other specified disorders of bone density and structure, unspecified site: Secondary | ICD-10-CM | POA: Diagnosis not present

## 2019-08-14 NOTE — Assessment & Plan Note (Signed)
08/06/19 wbc 3.3, Hgb 8.2, plt 59, neutrophils 50.9, Fe 141, ferritin 158, Vit B12 1276, Folate 9.4, Na 143, K 4.3, Bun 31, creat 1.27, CBC one wk 08/13/19 wbc 2.5, Hgb 7.6, plt 57, neutrophils 48.4 08/14/19 denied abd pain today, denied indigestion, nausea, vomiting, blood in urine or stool. Repeat CBC one wk, change Tylenol, Tramadol to prn to reduce possible GI irritation given Hx of GI bleed. Hx of pancytopenia, underwent hematology evaluation-not candidate for bone marrow biopsy/workup. May consider Hospice vs blood transfusion which the patient brought it up during today's visit.

## 2019-08-14 NOTE — Progress Notes (Signed)
Location:  [facility] Room Number: [place] of Service:  ALF [phone]) Provider: Javiana Anwar Otho Darner, NP   Virgie Dad, MD  Patient Care Team: Virgie Dad, MD as PCP - General (Internal Medicine) Irelyn Perfecto X, NP as Nurse Practitioner (Internal Medicine)  Extended Emergency Contact Information Primary Emergency Contact: Stanislawski,[name] Address: [address]          [place],  [address] [place] Phone: [phone] Mobile Phone: [phone] Relation: Son Secondary Emergency Contact: Joaquin,[name]  [place] Phone: [phone] Relation: None  Code Status:  DNR Goals of care: Advanced Directive information Advanced Directives 08/14/2019  Does Patient Have a Medical Advance Directive? Yes  Type of Advance Directive Out of facility DNR (pink MOST or yellow form);Healthcare Power of Attorney  Does patient want to make changes to medical advance directive? No - Patient declined  Copy of [place] in Chart? Yes - validated most recent copy scanned in chart (See row information)  Pre-existing out of facility DNR order (yellow form or pink MOST form) Yellow form placed in chart (order not valid for inpatient use)     Chief Complaint  Patient presents with  . Acute Visit    Anemia     HPI:  Pt is a 86 y.o. female seen today for an acute visit for persisted anemia, the patient's Hgb dropped to 7.6 08/13/19, she denied blood in urine or stool, refused FOBT, no abd pain today, she denied chest pain, SOB, palpitation,  indigestion, nausea, vomiting. Hx of GI bleed, blood transfusion/Pancytopenia. Lower back pain, managed on Tylenol, Tramadol, Gabapentin, Methocarbamol, ambulates with walker with therapy. HTN blood pressure is controlled on Hydralazine 52m bid, Losartan 1067mqd. GERD stable, on Omeprazole 4051md. Edema, chronic, BLE,  on Torsemide 67m53m.    Past Medical History:  Diagnosis Date  . Anemia   .  Anxiety   . Chronotropic incompetence 12/2012    potentially medication related; noted on CPET   . DDD (degenerative disc disease)   . Eczema   . GERD (gastroesophageal reflux disease)   . H/O hiatal hernia   . Hx: UTI (urinary tract infection)   . Hyperlipidemia   . Hypertension   . Hypothyroidism   . Osteopenia   . Septicemia (HCC)[place]02   following UTI  . Vertigo, benign positional    Past Surgical History:  Procedure Laterality Date  . BREAST SURGERY     left biopsy  . CATARACT EXTRACTION Right    2 weeks ago  . CPET / MET - PFTS     Consistent with chronotropic incompetence; See attached report in the results section  . DILATION AND CURETTAGE OF UTERUS     x 2  . DOPPLER ECHOCARDIOGRAPHY  01/10/2013   Normal LV size and function. Normal EF. Air sclerosis but no stenosis  . ESOPHAGOGASTRODUODENOSCOPY  05/04/2012   Procedure: ESOPHAGOGASTRODUODENOSCOPY (EGD);  Surgeon: RobeInda Castle;  [facility];  Service: Endoscopy;  Laterality: N/A;  . EYE SURGERY     cataract extraction with ILO  ? eye  . IR KYPHO EA ADDL LEVEL THORACIC OR LUMBAR  10/28/2016  . IR KYPHO LUMBAR INC FX REDUCE BONE BX UNI/BIL CANNULATION INC/IMAGING  10/28/2016  . IR KYPHO THORACIC WITH BONE BIOPSY  12/24/2016  . IR RADIOLOGIST EVAL & MGMT  11/11/2016  . KNEE ARTHROSCOPY  2011   right  . TONSILLECTOMY    .  TOTAL KNEE ARTHROPLASTY  04/24/2012   Procedure: TOTAL KNEE ARTHROPLASTY;  Surgeon: Gearlean Alf, MD;  Location: [facility];  Service: Orthopedics;  Laterality: Right;  . TRANSTHORACIC ECHOCARDIOGRAM  02/2018   Ordered by PCP for "congestive heart failure " -EF 60 M 65%.  GR 1 DD.  No R WMA--- > ESSENTIALLY NORMAL    No Known Allergies  Outpatient Encounter Medications as of 08/14/2019  Medication Sig  . acetaminophen (TYLENOL) 500 MG tablet Take by mouth 2 (two) times a day. [phone]  . acetaminophen (TYLENOL) 500 MG tablet Take 1,000 mg by mouth 2 (two) times daily.  Marland Kitchen ALPRAZolam  (XANAX) 0.25 MG tablet Take 1 tablet (0.25 mg total) by mouth at bedtime.  Marland Kitchen atorvastatin (LIPITOR) 10 MG tablet Take 5 mg by mouth daily.   Marland Kitchen buPROPion (WELLBUTRIN SR) 150 MG 12 hr tablet Take 1 tablet (150 mg total) by mouth 2 (two) times daily after a meal.  . Calcium-Phosphorus-Vitamin D (CITRACAL +D3 PO) Take 2 capsules by mouth 2 (two) times a day. 694m-25mcg(1000u)   . Camphor-Menthol-Methyl Sal 3.07-31-08 % PTCH Place 1 patch onto the skin. Apply one to lower back daily for back pain  . docusate sodium (COLACE) 100 MG capsule Take 100 mg by mouth 2 (two) times daily.  . Fiber POWD Take 15 mLs by mouth daily. Mix with 4-8 oz of water  . fish oil-omega-3 fatty acids 1000 MG capsule Take 1 g by mouth daily.  .Marland Kitchengabapentin (NEURONTIN) 100 MG capsule Take 100 mg by mouth 3 (three) times daily.  . hydrALAZINE (APRESOLINE) 25 MG tablet Take 1 tablet (25 mg total) by mouth 2 (two) times daily.  .Marland KitchenKLOR-CON M20 20 MEQ tablet TAKE 1 TABLET BY MOUTH TWICE A DAY  . levothyroxine (SYNTHROID, LEVOTHROID) 75 MCG tablet Take 1 tablet (75 mcg total) by mouth daily before breakfast.  . loperamide (IMODIUM A-D) 2 MG tablet Take 2 mg by mouth as needed for diarrhea or loose stools. Check rectum for retained stool. If >3 loose stools in 24 hrs., hold all lax. and stool softeners. Give Imodium AD (loperamide) 4 mg po 1st dose, then 2 mg po after each loose stool X 48 hrs. Not to exceed 155min 24 hrs. If not effective, notify MD.  . losartan (COZAAR) 100 MG tablet Take 1 tablet (100 mg total) by mouth daily.  . Melaton-Thean-Cham-PassF-LBalm (MELATONIN + L-THEANINE PO) See admin instructions. Olly Sleep Gummy Melatonin 4m1mL-Theanine 100m55mnd botanicals, blackberry Zen, Special Instructions: Take 2 gummies PO QHS as sleep aide.  . methocarbamol (ROBAXIN) 500 MG tablet Take 250 mg by mouth daily. For muscle spasms  . nitrofurantoin (MACRODANTIN) 50 MG capsule Take 1 capsule (50 mg total) by mouth daily.  .  nMarland Kitchenstatin (MYCOSTATIN/NYSTOP) powder Apply 100,000 g topically 2 (two) times daily.  . omMarland Kitchenprazole (PRILOSEC) 40 MG capsule Take 40 mg by mouth daily.  . polyethylene glycol (MIRALAX / GLYCOLAX) 17 g packet Take 17 g by mouth every other day. For constipation   . sertraline (ZOLOFT) 25 MG tablet Take 25 mg by mouth daily.  . toMarland Kitchensemide (DEMADEX) 20 MG tablet Take 20 mg by mouth daily.  . traMADol (ULTRAM) 50 MG tablet Take 75 mg by mouth 2 (two) times daily as needed.  . traMADol (ULTRAM) 50 MG tablet Take 50 mg by mouth daily.  . [DISCONTINUED] traMADol (ULTRAM) 50 MG tablet Take one tablet by mouth every evening scheduled.   No facility-administered encounter medications on file  as of 08/14/2019.    Review of Systems  Constitutional: Positive for fatigue. Negative for activity change, appetite change, chills, diaphoresis and fever.       Chronic fatigue.   HENT: Positive for hearing loss. Negative for congestion and voice change.   Eyes: Negative for visual disturbance.  Respiratory: Negative for cough, shortness of breath and wheezing.   Cardiovascular: Positive for leg swelling. Negative for chest pain and palpitations.  Gastrointestinal: Negative for abdominal distention, abdominal pain, constipation, diarrhea, nausea and vomiting.  Genitourinary: Negative for difficulty urinating, dysuria and urgency.  Musculoskeletal: Positive for arthralgias, back pain and gait problem.  Neurological: Negative for dizziness, syncope, facial asymmetry, speech difficulty, weakness and headaches.  Psychiatric/Behavioral: Negative for agitation, behavioral problems, hallucinations and sleep disturbance. The patient is not nervous/anxious.     Immunization History  Administered Date(s) Administered  . Influenza, High Dose Seasonal PF 04/26/2016, 03/22/2017  . Influenza,inj,Quad PF,6+ Mos 04/27/2018  . Influenza-Unspecified 04/22/2011, 04/04/2012, 05/03/2013, 05/04/2013, 04/17/2014, 04/19/2015,  04/26/2016, 04/22/2017  . Pneumococcal Polysaccharide-23 08/26/2003, 09/06/2011, 05/15/2019  . Pneumococcal-Unspecified 12/20/2013  . Td 09/26/2006  . Tdap 05/15/2019  . Zoster 10/25/2007  . Zoster Recombinat (Shingrix) 11/21/2017, 04/08/2018   Pertinent  Health Maintenance Due  Topic Date Due  . PNA vac Low Risk Adult (2 of 2 - PCV13) 05/14/2020  . INFLUENZA VACCINE  Completed  . DEXA SCAN  Completed   Fall Risk  10/18/2018 08/23/2018 05/09/2018 05/01/2018 04/10/2018  Falls in the past year? 0 0 Yes Yes No  Comment - - - fell in June/2019 _0  Home (asst living) -  Number falls in past yr: 0 0 1 1 -  Injury with Fall? 0 0 No No -  Risk for fall due to : - - - Impaired balance/gait -  Follow up - - - Falls prevention discussed -   Functional Status Survey:    Vitals:   08/14/19 1144  BP: 106/72  Pulse: (!) 53  Resp: 16  Temp: (!) 96.7 F (35.9 C)  TempSrc: Oral  SpO2: 93%  Weight: 180 lb 6.4 oz (81.8 kg)  Height: _1  (1.626 m)   Body mass index is 30.97 kg/m. Physical Exam Vitals and nursing note reviewed.  Constitutional:      General: She is not in acute distress.    Appearance: Normal appearance. She is not ill-appearing, toxic-appearing or diaphoretic.  HENT:     Head: Normocephalic and atraumatic.     Nose: Nose normal.     Mouth/Throat:     Mouth: Mucous membranes are moist.  Eyes:     Extraocular Movements: Extraocular movements intact.     Conjunctiva/sclera: Conjunctivae normal.     Pupils: Pupils are equal, round, and reactive to light.  Cardiovascular:     Rate and Rhythm: Normal rate and regular rhythm.     Heart sounds: No murmur.  Pulmonary:     Breath sounds: No wheezing, rhonchi or rales.  Abdominal:     General: Bowel sounds are normal. There is no distension.     Palpations: Abdomen is soft.     Tenderness: There is no abdominal tenderness. There is no right CVA tenderness, left CVA tenderness, guarding or rebound.  Musculoskeletal:      Cervical back: Normal range of motion and neck supple.     Right lower leg: Edema present.     Left lower leg: Edema present.     Comments: Edema 1+ BLE  Skin:    General: Skin is  warm and dry.  Neurological:     General: No focal deficit present.     Mental Status: She is alert. Mental status is at baseline.     Motor: No weakness.     Coordination: Coordination normal.     Gait: Gait abnormal.     Comments: Oriented to person, place.   Psychiatric:        Mood and Affect: Mood normal.        Behavior: Behavior normal.        Thought Content: Thought content normal.        Judgment: Judgment normal.     Labs reviewed: Recent Labs    11/23/18 0000 01/04/19 0000 08/07/19 0000  NA 134* 138 143  K 4.1 3.9 4.3  CL 96*  --  108  CO2 31  --  27*  GLUCOSE 88  --   --   BUN 27* 28* 31*  CREATININE 1.30* 1.2* 1.3*  CALCIUM 10.2  --   --    Recent Labs    11/23/18 0000 01/04/19 0000 08/07/19 0000  AST _0 ALT _1 ALKPHOS  --   --  102  BILITOT 0.3  --   --   PROT 7.6  --   --    Recent Labs    09/21/18 0815 09/21/18 0815 11/23/18 0000 11/23/18 0000 01/04/19 0000 01/19/19 0000 08/07/19 0000  WBC 2.9*   < > 3.6*  --   --  3.6 3.3  NEUTROABS 1,441*  --  2,048  --   --   --   --   HGB 10.1*   < > 10.7*   < > 9.3* 8.9* 8.2*  HCT 30.6*   < > 32.8*   < > 29* 28* 27*  MCV 86.0  --  85.4  --   --   --   --   PLT 83*   < > 88*   < > 89* 74* 59*   < > = values in this interval not displayed.   Lab Results  Component Value Date   TSH 2.90 11/23/2018   No results found for: HGBA1C Lab Results  Component Value Date   CHOL 227 (H) 11/23/2018   HDL 60 11/23/2018   LDLCALC 141 (H) 11/23/2018   TRIG 140 11/23/2018   CHOLHDL 3.8 11/23/2018    Significant Diagnostic Results in last 30 days:   Assessment/Plan Anemia of chronic disease 08/06/19 wbc 3.3, Hgb 8.2, plt 59, neutrophils 50.9, Fe 141, ferritin 158, Vit B12 1276, Folate 9.4, Na 143, K 4.3, Bun  31, creat 1.27, CBC one wk 08/13/19 wbc 2.5, Hgb 7.6, plt 57, neutrophils 48.4 08/14/19 denied abd pain today, denied indigestion, nausea, vomiting, blood in urine or stool. Repeat CBC one wk, change Tylenol, Tramadol to prn to reduce possible GI irritation given Hx of GI bleed. Hx of pancytopenia, underwent hematology evaluation-not candidate for bone marrow biopsy/workup. May consider Hospice vs blood transfusion which the patient brought it up during today's visit.    Chronic back pain Change Tylenol 1084m tid prn, Tramadol 543mqid prn to reduce possible GI irritation/bleed, continue Gabapentin, Methocarbamol.   Pancytopenia (HCReklawUnderwent Hematology eval, not a candidate for bone marrow biopsy/further workups, may consider Hospice vs blood transfusion if Hgb continues to drop.   GERD (gastroesophageal reflux disease) Stable, continue Omeprazole.   Essential hypertension Blood pressure is controlled, continue Hydralazine, Losartan.   PVD (peripheral vascular disease) (HCUlmTrace  to 1+ edema BLE, continue Torsemide 40m qd.       Family/ staff Communication: plan of care reviewed with the patient and charge nurse.   Labs/tests ordered:  CBC/diff one week  Time spend 40 minutes.

## 2019-08-14 NOTE — Assessment & Plan Note (Signed)
Stable, continue Omeprazole.  

## 2019-08-14 NOTE — Assessment & Plan Note (Signed)
Trace to 1+ edema BLE, continue Torsemide 20mg  qd.

## 2019-08-14 NOTE — Assessment & Plan Note (Signed)
Underwent Hematology eval, not a candidate for bone marrow biopsy/further workups, may consider Hospice vs blood transfusion if Hgb continues to drop.

## 2019-08-14 NOTE — Assessment & Plan Note (Signed)
Blood pressure is controlled, continue Hydralazine, Losartan.  °

## 2019-08-14 NOTE — Assessment & Plan Note (Signed)
Change Tylenol 1000mg  tid prn, Tramadol 50mg  qid prn to reduce possible GI irritation/bleed, continue Gabapentin, Methocarbamol.

## 2019-08-16 DIAGNOSIS — M6281 Muscle weakness (generalized): Secondary | ICD-10-CM | POA: Diagnosis not present

## 2019-08-16 DIAGNOSIS — M546 Pain in thoracic spine: Secondary | ICD-10-CM | POA: Diagnosis not present

## 2019-08-16 DIAGNOSIS — Z23 Encounter for immunization: Secondary | ICD-10-CM | POA: Diagnosis not present

## 2019-08-16 DIAGNOSIS — M810 Age-related osteoporosis without current pathological fracture: Secondary | ICD-10-CM | POA: Diagnosis not present

## 2019-08-16 DIAGNOSIS — H8110 Benign paroxysmal vertigo, unspecified ear: Secondary | ICD-10-CM | POA: Diagnosis not present

## 2019-08-16 DIAGNOSIS — Z0000 Encounter for general adult medical examination without abnormal findings: Secondary | ICD-10-CM | POA: Diagnosis not present

## 2019-08-16 DIAGNOSIS — R0781 Pleurodynia: Secondary | ICD-10-CM | POA: Diagnosis not present

## 2019-08-16 DIAGNOSIS — M1990 Unspecified osteoarthritis, unspecified site: Secondary | ICD-10-CM | POA: Diagnosis not present

## 2019-08-16 DIAGNOSIS — R269 Unspecified abnormalities of gait and mobility: Secondary | ICD-10-CM | POA: Diagnosis not present

## 2019-08-16 DIAGNOSIS — H9190 Unspecified hearing loss, unspecified ear: Secondary | ICD-10-CM | POA: Diagnosis not present

## 2019-08-16 DIAGNOSIS — I10 Essential (primary) hypertension: Secondary | ICD-10-CM | POA: Diagnosis not present

## 2019-08-16 DIAGNOSIS — M545 Low back pain: Secondary | ICD-10-CM | POA: Diagnosis not present

## 2019-08-16 DIAGNOSIS — R2681 Unsteadiness on feet: Secondary | ICD-10-CM | POA: Diagnosis not present

## 2019-08-16 DIAGNOSIS — Z008 Encounter for other general examination: Secondary | ICD-10-CM | POA: Diagnosis not present

## 2019-08-16 DIAGNOSIS — Z1389 Encounter for screening for other disorder: Secondary | ICD-10-CM | POA: Diagnosis not present

## 2019-08-16 DIAGNOSIS — N390 Urinary tract infection, site not specified: Secondary | ICD-10-CM | POA: Diagnosis not present

## 2019-08-16 DIAGNOSIS — K219 Gastro-esophageal reflux disease without esophagitis: Secondary | ICD-10-CM | POA: Diagnosis not present

## 2019-08-16 DIAGNOSIS — R5381 Other malaise: Secondary | ICD-10-CM | POA: Diagnosis not present

## 2019-08-16 DIAGNOSIS — M8580 Other specified disorders of bone density and structure, unspecified site: Secondary | ICD-10-CM | POA: Diagnosis not present

## 2019-08-16 DIAGNOSIS — R0600 Dyspnea, unspecified: Secondary | ICD-10-CM | POA: Diagnosis not present

## 2019-08-16 DIAGNOSIS — W19XXXA Unspecified fall, initial encounter: Secondary | ICD-10-CM | POA: Diagnosis not present

## 2019-08-16 DIAGNOSIS — M5380 Other specified dorsopathies, site unspecified: Secondary | ICD-10-CM | POA: Diagnosis not present

## 2019-08-16 DIAGNOSIS — Z8719 Personal history of other diseases of the digestive system: Secondary | ICD-10-CM | POA: Diagnosis not present

## 2019-08-16 DIAGNOSIS — E785 Hyperlipidemia, unspecified: Secondary | ICD-10-CM | POA: Diagnosis not present

## 2019-08-20 DIAGNOSIS — R2681 Unsteadiness on feet: Secondary | ICD-10-CM | POA: Diagnosis not present

## 2019-08-20 DIAGNOSIS — H8110 Benign paroxysmal vertigo, unspecified ear: Secondary | ICD-10-CM | POA: Diagnosis not present

## 2019-08-20 DIAGNOSIS — M546 Pain in thoracic spine: Secondary | ICD-10-CM | POA: Diagnosis not present

## 2019-08-20 DIAGNOSIS — M810 Age-related osteoporosis without current pathological fracture: Secondary | ICD-10-CM | POA: Diagnosis not present

## 2019-08-20 DIAGNOSIS — M5380 Other specified dorsopathies, site unspecified: Secondary | ICD-10-CM | POA: Diagnosis not present

## 2019-08-20 DIAGNOSIS — R0600 Dyspnea, unspecified: Secondary | ICD-10-CM | POA: Diagnosis not present

## 2019-08-20 DIAGNOSIS — M6281 Muscle weakness (generalized): Secondary | ICD-10-CM | POA: Diagnosis not present

## 2019-08-20 DIAGNOSIS — R0781 Pleurodynia: Secondary | ICD-10-CM | POA: Diagnosis not present

## 2019-08-20 DIAGNOSIS — I10 Essential (primary) hypertension: Secondary | ICD-10-CM | POA: Diagnosis not present

## 2019-08-20 DIAGNOSIS — E785 Hyperlipidemia, unspecified: Secondary | ICD-10-CM | POA: Diagnosis not present

## 2019-08-20 DIAGNOSIS — M1990 Unspecified osteoarthritis, unspecified site: Secondary | ICD-10-CM | POA: Diagnosis not present

## 2019-08-20 DIAGNOSIS — Z008 Encounter for other general examination: Secondary | ICD-10-CM | POA: Diagnosis not present

## 2019-08-20 DIAGNOSIS — R5381 Other malaise: Secondary | ICD-10-CM | POA: Diagnosis not present

## 2019-08-20 DIAGNOSIS — Z23 Encounter for immunization: Secondary | ICD-10-CM | POA: Diagnosis not present

## 2019-08-20 DIAGNOSIS — M8580 Other specified disorders of bone density and structure, unspecified site: Secondary | ICD-10-CM | POA: Diagnosis not present

## 2019-08-20 DIAGNOSIS — Z1389 Encounter for screening for other disorder: Secondary | ICD-10-CM | POA: Diagnosis not present

## 2019-08-20 DIAGNOSIS — W19XXXA Unspecified fall, initial encounter: Secondary | ICD-10-CM | POA: Diagnosis not present

## 2019-08-20 DIAGNOSIS — R269 Unspecified abnormalities of gait and mobility: Secondary | ICD-10-CM | POA: Diagnosis not present

## 2019-08-20 DIAGNOSIS — D638 Anemia in other chronic diseases classified elsewhere: Secondary | ICD-10-CM | POA: Diagnosis not present

## 2019-08-20 DIAGNOSIS — Z0000 Encounter for general adult medical examination without abnormal findings: Secondary | ICD-10-CM | POA: Diagnosis not present

## 2019-08-20 DIAGNOSIS — H9190 Unspecified hearing loss, unspecified ear: Secondary | ICD-10-CM | POA: Diagnosis not present

## 2019-08-20 DIAGNOSIS — K219 Gastro-esophageal reflux disease without esophagitis: Secondary | ICD-10-CM | POA: Diagnosis not present

## 2019-08-20 DIAGNOSIS — M545 Low back pain: Secondary | ICD-10-CM | POA: Diagnosis not present

## 2019-08-20 DIAGNOSIS — Z8719 Personal history of other diseases of the digestive system: Secondary | ICD-10-CM | POA: Diagnosis not present

## 2019-08-20 DIAGNOSIS — N390 Urinary tract infection, site not specified: Secondary | ICD-10-CM | POA: Diagnosis not present

## 2019-08-20 LAB — CBC AND DIFFERENTIAL
HCT: 26 — AB (ref 36–46)
Hemoglobin: 7.8 — AB (ref 12.0–16.0)
Platelets: 65 — AB (ref 150–399)
WBC: 3.4

## 2019-08-20 LAB — CBC: RBC: 2.96 — AB (ref 3.87–5.11)

## 2019-08-29 ENCOUNTER — Other Ambulatory Visit: Payer: Self-pay | Admitting: *Deleted

## 2019-08-29 MED ORDER — ALPRAZOLAM 0.25 MG PO TABS: 0.2500 mg | ORAL_TABLET | Freq: Every day | ORAL | 0 refills | Status: DC

## 2019-08-29 NOTE — Telephone Encounter (Signed)
Received fax from [facility] Pended Rx and sent to Dr. Gupta for approval.  

## 2019-09-07 ENCOUNTER — Encounter: Payer: Self-pay | Admitting: Internal Medicine

## 2019-09-07 ENCOUNTER — Non-Acute Institutional Stay: Payer: Medicare Other | Admitting: Internal Medicine

## 2019-09-07 DIAGNOSIS — I10 Essential (primary) hypertension: Secondary | ICD-10-CM

## 2019-09-07 DIAGNOSIS — G8929 Other chronic pain: Secondary | ICD-10-CM

## 2019-09-07 DIAGNOSIS — D61818 Other pancytopenia: Secondary | ICD-10-CM

## 2019-09-07 DIAGNOSIS — M549 Dorsalgia, unspecified: Secondary | ICD-10-CM | POA: Diagnosis not present

## 2019-09-07 DIAGNOSIS — R109 Unspecified abdominal pain: Secondary | ICD-10-CM | POA: Diagnosis not present

## 2019-09-07 DIAGNOSIS — R1012 Left upper quadrant pain: Secondary | ICD-10-CM | POA: Diagnosis not present

## 2019-09-07 NOTE — Progress Notes (Signed)
Location:   [facility] Room Number: 79 Place of Service:  ALF [phone]) Provider:  Veleta Miners, MD  Virgie Dad, MD  Patient Care Team: Virgie Dad, MD as PCP - General (Internal Medicine) Mast, Man X, NP as Nurse Practitioner (Internal Medicine)  Extended Emergency Contact Information Primary Emergency Contact: Donnell,[name] Address: [address]          [place], [place] [address] [place] Phone: [phone] Mobile Phone: [phone] Relation: Son Secondary Emergency Contact: Dearmond,[name]  [place] Phone: [phone] Relation: None  Code Status:  DNR Goals of care: Advanced Directive information Advanced Directives 09/07/2019  Does Patient Have a Medical Advance Directive? Yes  Type of Paramedic of [place];Out of facility DNR (pink MOST or yellow form)  Does patient want to make changes to medical advance directive? No - Patient declined  Copy of [place] in Chart? Yes - validated most recent copy scanned in chart (See row information)  Pre-existing out of facility DNR order (yellow form or pink MOST form) Pink MOST/Yellow Form most recent copy in chart - Physician notified to receive inpatient order     Chief Complaint  Patient presents with  . Acute Visit    Follow Up On BP    HPI:  Pt is a 86 y.o. female seen today for an acute visit for Low BP  Also c/o Some Left UQ pain  Patient has h/o Chronic Back Pain, Hypertension, Hypothyroidism, Hypertension, Recurrent Cystitis, Osteoporosis and Major Depression And Pancytopenia Resident of AL  Hypotension Her BP had been running sometime around Systolic 264 She does have h/o Dizziness Pancytopenia Per hematology no work up right anymore Last Hgb is 7.8 Whit Count 3.4 Platelets were 65 Left Upper Quadrant Pain Mild Pain in her Left Upper quadrant which has been her persistent complain recently Also Has low back  pain due to her Chronic Compression Fractures   Past Medical History:  Diagnosis Date  . Anemia   . Anxiety   . Chronotropic incompetence 12/2012    potentially medication related; noted on CPET   . DDD (degenerative disc disease)   . Eczema   . GERD (gastroesophageal reflux disease)   . H/O hiatal hernia   . Hx: UTI (urinary tract infection)   . Hyperlipidemia   . Hypertension   . Hypothyroidism   . Osteopenia   . Septicemia ([place]) 2002   following UTI  . Vertigo, benign positional    Past Surgical History:  Procedure Laterality Date  . BREAST SURGERY     left biopsy  . CATARACT EXTRACTION Right    2 weeks ago  . CPET / MET - PFTS     Consistent with chronotropic incompetence; See attached report in the results section  . DILATION AND CURETTAGE OF UTERUS     x 2  . DOPPLER ECHOCARDIOGRAPHY  01/10/2013   Normal LV size and function. Normal EF. Air sclerosis but no stenosis  . ESOPHAGOGASTRODUODENOSCOPY  05/04/2012   Procedure: ESOPHAGOGASTRODUODENOSCOPY (EGD);  Surgeon: Inda Castle, MD;  Location: [facility];  Service: Endoscopy;  Laterality: N/A;  . EYE SURGERY     cataract extraction with ILO  ? eye  . IR KYPHO EA ADDL LEVEL THORACIC OR LUMBAR  10/28/2016  . IR KYPHO LUMBAR INC FX REDUCE BONE BX UNI/BIL CANNULATION INC/IMAGING  10/28/2016  . IR KYPHO THORACIC WITH BONE BIOPSY  12/24/2016  . IR RADIOLOGIST  EVAL & MGMT  11/11/2016  . KNEE ARTHROSCOPY  2011   right  . TONSILLECTOMY    . TOTAL KNEE ARTHROPLASTY  04/24/2012   Procedure: TOTAL KNEE ARTHROPLASTY;  Surgeon: Gearlean Alf, MD;  Location: [facility];  Service: Orthopedics;  Laterality: Right;  . TRANSTHORACIC ECHOCARDIOGRAM  02/2018   Ordered by PCP for "congestive heart failure " -EF 60 M 65%.  GR 1 DD.  No R WMA--- > ESSENTIALLY NORMAL    No Known Allergies  Allergies as of 09/07/2019   No Known Allergies     Medication List       Accurate as of September 07, 2019 11:59 AM. If you have any questions, ask  your nurse or doctor.        acetaminophen 500 MG tablet Commonly known as: TYLENOL Take by mouth 2 (two) times a day. [phone]   TYLENOL 500 MG tablet Generic drug: acetaminophen Take 1,000 mg by mouth 2 (two) times daily.   ALPRAZolam 0.25 MG tablet Commonly known as: XANAX Take 1 tablet (0.25 mg total) by mouth at bedtime.   atorvastatin 10 MG tablet Commonly known as: LIPITOR Take 5 mg by mouth daily.   buPROPion 150 MG 12 hr tablet Commonly known as: WELLBUTRIN SR Take 1 tablet (150 mg total) by mouth 2 (two) times daily after a meal.   Camphor-Menthol-Methyl Sal 3.07-31-08 % Ptch Place 1 patch onto the skin. Apply one to lower back daily for back pain   CITRACAL +D3 PO Take 2 capsules by mouth 2 (two) times a day. 660m-25mcg(1000u)   docusate sodium 100 MG capsule Commonly known as: COLACE Take 100 mg by mouth 2 (two) times daily.   Fiber Powd Take 15 mLs by mouth daily. Mix with 4-8 oz of water   fish oil-omega-3 fatty acids 1000 MG capsule Take 1 g by mouth daily.   gabapentin 100 MG capsule Commonly known as: NEURONTIN Take 100 mg by mouth 3 (three) times daily.   hydrALAZINE 25 MG tablet Commonly known as: APRESOLINE Take 1 tablet (25 mg total) by mouth 2 (two) times daily.   Klor-Con M20 20 MEQ tablet Generic drug: potassium chloride SA TAKE 1 TABLET BY MOUTH TWICE A DAY   levothyroxine 75 MCG tablet Commonly known as: SYNTHROID Take 1 tablet (75 mcg total) by mouth daily before breakfast.   loperamide 2 MG tablet Commonly known as: IMODIUM A-D Take 2 mg by mouth as needed for diarrhea or loose stools. Check rectum for retained stool. If >3 loose stools in 24 hrs., hold all lax. and stool softeners. Give Imodium AD (loperamide) 4 mg po 1st dose, then 2 mg po after each loose stool X 48 hrs. Not to exceed 149min 24 hrs. If not effective, notify MD.   losartan 100 MG tablet Commonly known as: COZAAR Take 1 tablet (100 mg total) by mouth  daily.   MELATONIN + L-THEANINE PO See admin instructions. Olly Sleep Gummy Melatonin 68m12mL-Theanine 100m24mnd botanicals, blackberry Zen, Special Instructions: Take 2 gummies PO QHS as sleep aide.   methocarbamol 500 MG tablet Commonly known as: ROBAXIN Take 250 mg by mouth daily. For muscle spasms   nitrofurantoin 50 MG capsule Commonly known as: MACRODANTIN Take 1 capsule (50 mg total) by mouth daily.   nystatin powder Commonly known as: MYCOSTATIN/NYSTOP Apply 100,000 g topically 2 (two) times daily.   omeprazole 40 MG capsule Commonly known as: PRILOSEC Take 40 mg by mouth daily.   polyethylene  glycol 17 g packet Commonly known as: MIRALAX / GLYCOLAX Take 17 g by mouth every other day. For constipation   sertraline 25 MG tablet Commonly known as: ZOLOFT Take 25 mg by mouth daily.   torsemide 20 MG tablet Commonly known as: DEMADEX Take 20 mg by mouth daily.   traMADol 50 MG tablet Commonly known as: ULTRAM Take 75 mg by mouth 2 (two) times daily as needed.   traMADol 50 MG tablet Commonly known as: ULTRAM Take 50 mg by mouth daily.       Review of Systems  Constitutional: Negative.   HENT: Negative.   Respiratory: Negative.   Cardiovascular: Positive for leg swelling.  Gastrointestinal: Positive for abdominal pain. Negative for constipation and nausea.  Genitourinary: Negative.   Musculoskeletal: Positive for arthralgias, back pain and gait problem.  Skin: Negative.   Neurological: Positive for dizziness and weakness.  Psychiatric/Behavioral: Positive for dysphoric mood.  All other systems reviewed and are negative.   Immunization History  Administered Date(s) Administered  . Influenza, High Dose Seasonal PF 04/26/2016, 03/22/2017  . Influenza,inj,Quad PF,6+ Mos 04/27/2018  . Influenza-Unspecified 04/22/2011, 04/04/2012, 05/03/2013, 05/04/2013, 04/17/2014, 04/19/2015, 04/26/2016, 04/22/2017  . Moderna SARS-COVID-2 Vaccination 07/30/2019,  08/27/2019  . Pneumococcal Polysaccharide-23 08/26/2003, 09/06/2011, 05/15/2019  . Pneumococcal-Unspecified 12/20/2013  . Td 09/26/2006  . Tdap 05/15/2019  . Zoster 10/25/2007  . Zoster Recombinat (Shingrix) 11/21/2017, 04/08/2018   Pertinent  Health Maintenance Due  Topic Date Due  . PNA vac Low Risk Adult (2 of 2 - PCV13) 05/14/2020  . INFLUENZA VACCINE  Completed  . DEXA SCAN  Completed   Fall Risk  10/18/2018 08/23/2018 05/09/2018 05/01/2018 04/10/2018  Falls in the past year? 0 0 Yes Yes No  Comment - - - fell in June/2019 _0  Home (asst living) -  Number falls in past yr: 0 0 1 1 -  Injury with Fall? 0 0 No No -  Risk for fall due to : - - - Impaired balance/gait -  Follow up - - - Falls prevention discussed -   Functional Status Survey:    Vitals:   09/07/19 1153  BP: (!) 128/58  Pulse: 64  Resp: 16  Temp: 97.6 F (36.4 C)  SpO2: 98%  Weight: 178 lb 12.8 oz (81.1 kg)  Height: 5' 4" (1.626 m)   Body mass index is 30.69 kg/m. Physical Exam Vitals reviewed.  Constitutional:      Appearance: Normal appearance.  HENT:     Head: Normocephalic.     Nose: Nose normal.     Mouth/Throat:     Pharynx: Oropharynx is clear.  Eyes:     Pupils: Pupils are equal, round, and reactive to light.  Cardiovascular:     Rate and Rhythm: Normal rate and regular rhythm.     Pulses: Normal pulses.  Pulmonary:     Effort: Pulmonary effort is normal.     Breath sounds: Normal breath sounds.  Abdominal:     General: Abdomen is flat.     Palpations: Abdomen is soft.     Comments: C/o Mild  Tender in Left Upper quadrant  Musculoskeletal:        General: Swelling present.  Skin:    General: Skin is warm.  Neurological:     General: No focal deficit present.     Mental Status: She is alert and oriented to person, place, and time.  Psychiatric:        Mood and Affect: Mood normal.  Thought Content: Thought content normal.     Labs reviewed: Recent Labs     11/23/18 0000 01/04/19 0000 08/07/19 0000  NA 134* 138 143  K 4.1 3.9 4.3  CL 96*  --  108  CO2 31  --  27*  GLUCOSE 88  --   --   BUN 27* 28* 31*  CREATININE 1.30* 1.2* 1.3*  CALCIUM 10.2  --   --    Recent Labs    11/23/18 0000 01/04/19 0000 08/07/19 0000  AST _0 ALT _1 ALKPHOS  --   --  102  BILITOT 0.3  --   --   PROT 7.6  --   --    Recent Labs    09/21/18 0815 09/21/18 0815 11/23/18 0000 11/23/18 0000 01/04/19 0000 01/19/19 0000 08/07/19 0000  WBC 2.9*   < > 3.6*  --   --  3.6 3.3  NEUTROABS 1,441*  --  2,048  --   --   --   --   HGB 10.1*   < > 10.7*   < > 9.3* 8.9* 8.2*  HCT 30.6*   < > 32.8*   < > 29* 28* 27*  MCV 86.0  --  85.4  --   --   --   --   PLT 83*   < > 88*   < > 89* 74* 59*   < > = values in this interval not displayed.   Lab Results  Component Value Date   TSH 2.90 11/23/2018   No results found for: HGBA1C Lab Results  Component Value Date   CHOL 227 (H) 11/23/2018   HDL 60 11/23/2018   LDLCALC 141 (H) 11/23/2018   TRIG 140 11/23/2018   CHOLHDL 3.8 11/23/2018    Significant Diagnostic Results in last 30 days:  No results found.  Assessment/Plan  Essential hypertension Will discontinue Hydralazine Continue on Cozaar Norvasc stopped already  Acquired hypothyroidism TSH Normal in 4/20 Repeat TSH  Pancytopenia (HCC) It seems to be getting worse No More Work up per Hematologist Abdominal Pain Not sure the cause Can be coming from her Back.  She cannot go for CT scan at this time as she cannot even lie down for the test Will start with plain Abdominal Films  Chronic back pain, Doing well Tramadol and Robaxin and Neurontin  Stage 3 chronic kidney disease Creat Stable Depression, major, single episode, moderate ([place]) Has been stable on Wellbutrin Mixed hyperlipidemia On Lipitor LDL was 83 on Labs done in 10/20  Age-related osteoporosis Wants DEXA scan . Had refused before  LE edema Doing good  on Demadex Recurrent Cystitis Continue On Nitrofurantoin  Family/ staff Communication:   Labs/tests ordered:

## 2019-09-14 ENCOUNTER — Non-Acute Institutional Stay: Payer: Medicare Other | Admitting: Nurse Practitioner

## 2019-09-14 ENCOUNTER — Encounter: Payer: Self-pay | Admitting: Nurse Practitioner

## 2019-09-14 DIAGNOSIS — M545 Low back pain, unspecified: Secondary | ICD-10-CM

## 2019-09-14 DIAGNOSIS — E039 Hypothyroidism, unspecified: Secondary | ICD-10-CM

## 2019-09-14 DIAGNOSIS — F418 Other specified anxiety disorders: Secondary | ICD-10-CM

## 2019-09-14 DIAGNOSIS — I739 Peripheral vascular disease, unspecified: Secondary | ICD-10-CM | POA: Diagnosis not present

## 2019-09-14 DIAGNOSIS — R251 Tremor, unspecified: Secondary | ICD-10-CM | POA: Insufficient documentation

## 2019-09-14 DIAGNOSIS — G8929 Other chronic pain: Secondary | ICD-10-CM

## 2019-09-14 DIAGNOSIS — I10 Essential (primary) hypertension: Secondary | ICD-10-CM | POA: Diagnosis not present

## 2019-09-14 DIAGNOSIS — M170 Bilateral primary osteoarthritis of knee: Secondary | ICD-10-CM

## 2019-09-14 NOTE — Assessment & Plan Note (Signed)
TSH 2.9 10/2018, continue Levothyroxine, update TSH

## 2019-09-14 NOTE — Progress Notes (Signed)
Location:  [facility] Room Number: 62 Place of Service:  ALF [phone]) Provider:  Rie Mcneil Otho Darner, NP   Virgie Dad, MD  Patient Care Team: Virgie Dad, MD as PCP - General (Internal Medicine) Fotios Amos X, NP as Nurse Practitioner (Internal Medicine)  Extended Emergency Contact Information Primary Emergency Contact: Giannattasio,[name] Address: [address]          [place], [place] [address] [place] Phone: [phone] Mobile Phone: [phone] Relation: Son Secondary Emergency Contact: Winburn,[name]  [place] Phone: [phone] Relation: None  Code Status:  DNR Goals of care: Advanced Directive information Advanced Directives 09/14/2019  Does Patient Have a Medical Advance Directive? Yes  Type of Paramedic of [place];Out of facility DNR (pink MOST or yellow form)  Does patient want to make changes to medical advance directive? No - Patient declined  Copy of [place] in Chart? Yes - validated most recent copy scanned in chart (See row information)  Pre-existing out of facility DNR order (yellow form or pink MOST form) Yellow form placed in chart (order not valid for inpatient use)     Chief Complaint  Patient presents with  . Acute Visit    Complains of Shaky Hands    HPI:  Pt is a 86 y.o. female seen today for an acute visit for c/o hands shaking and trembling, gradual onset, resting tremor mostly in the right fingers, not disabling. The patient stated she feels jerking motion in arms sometimes. Hx of OA/lower back pain, taking Tylenol 1062m tid, Tramadol 536mq6h prn, prn Methocarbamol 25086maily, Gabapentin 100m26md,  depression/anxiety, her mood is stable, on Alprazolam 0.25mg41m, Wellbutrin 150mg 27m Sertraline 25mg q83mTN, blood pressure is controlled on Losartan 100mg qd78mdralazine 25mg bid9mpothyroidism, TSH wnl 10/2018, on Levothyroxine.    Past Medical History:    Diagnosis Date  . Anemia   . Anxiety   . Chronotropic incompetence 12/2012    potentially medication related; noted on CPET   . DDD (degenerative disc disease)   . Eczema   . GERD (gastroesophageal reflux disease)   . H/O hiatal hernia   . Hx: UTI (urinary tract infection)   . Hyperlipidemia   . Hypertension   . Hypothyroidism   . Osteopenia   . [place]  . Vertigo, benign positional    Past Surgical History:  Procedure Laterality Date  . BREAST SURGERY     left biopsy  . CATARACT EXTRACTION Right    2 weeks ago  . CPET / MET - PFTS     Consistent with chronotropic incompetence; See attached report in the results section  . DILATION AND CURETTAGE OF UTERUS     x 2  . DOPPLER ECHOCARDIOGRAPHY  01/10/2013   Normal LV size and function. Normal EF. Air sclerosis but no stenosis  . ESOPHAGOGASTRODUODENOSCOPY  05/04/2012   Procedure: ESOPHAGOGASTRODUODENOSCOPY (EGD);  Surgeon: Robert D Inda Castlecation[facility];  Service: Endoscopy;  Laterality: N/A;  . EYE SURGERY     cataract extraction with ILO  ? eye  . IR KYPHO EA ADDL LEVEL THORACIC OR LUMBAR  10/28/2016  . IR KYPHO LUMBAR INC FX REDUCE BONE BX UNI/BIL CANNULATION INC/IMAGING  10/28/2016  . IR KYPHO THORACIC WITH BONE BIOPSY  12/24/2016  . IR RADIOLOGIST EVAL & MGMT  11/11/2016  . KNEE ARTHROSCOPY  2011   right  .  TONSILLECTOMY    . TOTAL KNEE ARTHROPLASTY  04/24/2012   Procedure: TOTAL KNEE ARTHROPLASTY;  Surgeon: Gearlean Alf, MD;  Location: [facility];  Service: Orthopedics;  Laterality: Right;  . TRANSTHORACIC ECHOCARDIOGRAM  02/2018   Ordered by PCP for "congestive heart failure " -EF 60 M 65%.  GR 1 DD.  No R WMA--- > ESSENTIALLY NORMAL    No Known Allergies  Outpatient Encounter Medications as of 09/14/2019  Medication Sig  . acetaminophen (TYLENOL) 500 MG tablet Take 1,000 mg by mouth in the morning, at noon, and at bedtime.   . ALPRAZolam (XANAX) 0.25 MG tablet Take 1 tablet  (0.25 mg total) by mouth at bedtime.  Marland Kitchen atorvastatin (LIPITOR) 10 MG tablet Take 5 mg by mouth daily.   Marland Kitchen buPROPion (WELLBUTRIN SR) 150 MG 12 hr tablet Take 1 tablet (150 mg total) by mouth 2 (two) times daily after a meal.  . Calcium-Phosphorus-Vitamin D (CITRACAL +D3 PO) Take 2 capsules by mouth 2 (two) times a day. 655m-25mcg(1000u)   . Camphor-Menthol-Methyl Sal 3.07-31-08 % PTCH Place 1 patch onto the skin. Apply one to lower back daily for back pain  . docusate sodium (COLACE) 100 MG capsule Take 100 mg by mouth 2 (two) times daily.  . ferrous sulfate 325 (65 FE) MG EC tablet Take 325 mg by mouth every Monday, Wednesday, and Friday. With a meal  . Fiber POWD Take 15 mLs by mouth daily. Mix with 4-8 oz of water  . fish oil-omega-3 fatty acids 1000 MG capsule Take 1 g by mouth daily.  .Marland Kitchengabapentin (NEURONTIN) 100 MG capsule Take 100 mg by mouth 3 (three) times daily.  . hydrALAZINE (APRESOLINE) 25 MG tablet Take 1 tablet (25 mg total) by mouth 2 (two) times daily.  .Marland KitchenKLOR-CON M20 20 MEQ tablet TAKE 1 TABLET BY MOUTH TWICE A DAY  . levothyroxine (SYNTHROID, LEVOTHROID) 75 MCG tablet Take 1 tablet (75 mcg total) by mouth daily before breakfast.  . losartan (COZAAR) 100 MG tablet Take 1 tablet (100 mg total) by mouth daily.  . Melaton-Thean-Cham-PassF-LBalm (MELATONIN + L-THEANINE PO) See admin instructions. Olly Sleep Gummy Melatonin 315m L-Theanine 10049mand botanicals, blackberry Zen, Special Instructions: Take 2 gummies PO QHS as sleep aide.  . methocarbamol (ROBAXIN) 500 MG tablet Take 250 mg by mouth daily as needed. For muscle spasms   . nitrofurantoin (MACRODANTIN) 50 MG capsule Take 1 capsule (50 mg total) by mouth daily.  . nMarland Kitchenstatin (MYCOSTATIN/NYSTOP) powder Apply 100,000 g topically 2 (two) times daily as needed.   . oMarland Kitcheneprazole (PRILOSEC) 40 MG capsule Take 40 mg by mouth daily.  . polyethylene glycol (MIRALAX / GLYCOLAX) 17 g packet Take 17 g by mouth as directed. On Monday and  Thursday  . sertraline (ZOLOFT) 25 MG tablet Take 25 mg by mouth daily.  . tMarland Kitchenrsemide (DEMADEX) 20 MG tablet Take 20 mg by mouth daily.  . traMADol (ULTRAM) 50 MG tablet Take 50 mg by mouth every 6 (six) hours as needed.    No facility-administered encounter medications on file as of 09/14/2019.    Review of Systems  Constitutional: Positive for fatigue. Negative for activity change, appetite change, chills, diaphoresis and fever.       Chronic fatigue.   HENT: Positive for hearing loss. Negative for congestion and voice change.   Eyes: Negative for visual disturbance.  Respiratory: Negative for cough, shortness of breath and wheezing.   Cardiovascular: Positive for leg swelling. Negative for chest pain and palpitations.  Gastrointestinal: Negative for abdominal distention, abdominal pain, constipation, nausea and vomiting.  Genitourinary: Negative for difficulty urinating, dysuria and urgency.  Musculoskeletal: Positive for arthralgias, back pain and gait problem.  Skin: Negative for color change and pallor.  Neurological: Positive for tremors. Negative for dizziness, syncope, facial asymmetry, speech difficulty, weakness and headaches.  Psychiatric/Behavioral: Negative for agitation, behavioral problems, hallucinations and sleep disturbance. The patient is not nervous/anxious.     Immunization History  Administered Date(s) Administered  . Influenza, High Dose Seasonal PF 04/26/2016, 03/22/2017  . Influenza,inj,Quad PF,6+ Mos 04/27/2018  . Influenza-Unspecified 04/22/2011, 04/04/2012, 05/03/2013, 05/04/2013, 04/17/2014, 04/19/2015, 04/26/2016, 04/22/2017  . Moderna SARS-COVID-2 Vaccination 07/30/2019, 08/27/2019  . Pneumococcal Polysaccharide-23 08/26/2003, 09/06/2011, 05/15/2019  . Pneumococcal-Unspecified 12/20/2013  . Td 09/26/2006  . Tdap 05/15/2019  . Zoster 10/25/2007  . Zoster Recombinat (Shingrix) 11/21/2017, 04/08/2018   Pertinent  Health Maintenance Due  Topic Date Due    . PNA vac Low Risk Adult (2 of 2 - PCV13) 05/14/2020  . INFLUENZA VACCINE  Completed  . DEXA SCAN  Completed   Fall Risk  10/18/2018 08/23/2018 05/09/2018 05/01/2018 04/10/2018  Falls in the past year? 0 0 Yes Yes No  Comment - - - [facility]/2019 [facility] -  Number falls in past yr: 0 0 1 1 -  Injury with Fall? 0 0 No No -  Risk for fall due to : - - - Impaired balance/gait -  Follow up - - - Falls prevention discussed -   Functional Status Survey:    Vitals:   09/14/19 1107  BP: (!) 114/53  Pulse: 67  Resp: 18  Temp: (!) 96.6 F (35.9 C)  TempSrc: Oral  SpO2: 90%  Weight: 178 lb 12.8 oz (81.1 kg)  Height: '5\' 4"'  (1.626 m)   Body mass index is 30.69 kg/m. Physical Exam Vitals and nursing note reviewed.  Constitutional:      General: She is not in acute distress.    Appearance: Normal appearance. She is not ill-appearing, toxic-appearing or diaphoretic.  HENT:     Head: Normocephalic and atraumatic.     Nose: Nose normal.     Mouth/Throat:     Mouth: Mucous membranes are moist.  Eyes:     Extraocular Movements: Extraocular movements intact.     Conjunctiva/sclera: Conjunctivae normal.     Pupils: Pupils are equal, round, and reactive to light.  Cardiovascular:     Rate and Rhythm: Normal rate and regular rhythm.     Heart sounds: No murmur.  Pulmonary:     Breath sounds: No wheezing or rales.  Abdominal:     General: Bowel sounds are normal. There is no distension.     Palpations: Abdomen is soft.     Tenderness: There is no abdominal tenderness. There is no guarding or rebound.  Musculoskeletal:     Cervical back: Normal range of motion and neck supple.     Right lower leg: Edema present.     Left lower leg: Edema present.     Comments: Edema 1+ BLE  Skin:    General: Skin is warm and dry.  Neurological:     General: No focal deficit present.     Mental Status: She is alert. Mental status is at baseline.     Motor: No weakness.      Coordination: Coordination abnormal.     Gait: Gait abnormal.     Comments: Oriented to person, place. Resting tremor in fingers, mostly in the  right fingers at rest.   Psychiatric:        Mood and Affect: Mood normal.        Behavior: Behavior normal.        Thought Content: Thought content normal.        Judgment: Judgment normal.     Labs reviewed: Recent Labs    11/23/18 0000 01/04/19 0000 08/07/19 0000  NA 134* 138 143  K 4.1 3.9 4.3  CL 96*  --  108  CO2 31  --  27*  GLUCOSE 88  --   --   BUN 27* 28* 31*  CREATININE 1.30* 1.2* 1.3*  CALCIUM 10.2  --   --    Recent Labs    11/23/18 0000 01/04/19 0000 08/07/19 0000  AST '29 21 24  ' ALT '22 17 17  ' ALKPHOS  --   --  102  BILITOT 0.3  --   --   PROT 7.6  --   --    Recent Labs    09/21/18 0815 09/21/18 0815 11/23/18 0000 01/04/19 0000 01/19/19 0000 08/07/19 0000 08/20/19 0000  WBC 2.9*   < > 3.6*  --  3.6 3.3 3.4  NEUTROABS 1,441*  --  2,048  --   --   --   --   HGB 10.1*   < > 10.7*   < > 8.9* 8.2* 7.8*  HCT 30.6*   < > 32.8*   < > 28* 27* 26*  MCV 86.0  --  85.4  --   --   --   --   PLT 83*   < > 88*   < > 74* 59* 65*   < > = values in this interval not displayed.   Lab Results  Component Value Date   TSH 2.90 11/23/2018   No results found for: HGBA1C Lab Results  Component Value Date   CHOL 227 (H) 11/23/2018   HDL 60 11/23/2018   LDLCALC 141 (H) 11/23/2018   TRIG 140 11/23/2018   CHOLHDL 3.8 11/23/2018    Significant Diagnostic Results in last 30 days:  No results found.  Assessment/Plan Tremor of both hands Mild resting tremor mostly in right fingers, not disabling, metabolic vs EPS vs neurological etiology, will obtain CBC/diff, CMP/eGFR, TSH. GDR of psychotropic meds if no better. Observe.   Depression with anxiety Her mood is stable, continue Alprazolam, Wellbutrin, Sertraline.   Essential hypertension Blood pressure is controlled, continue Losartan, Hydralazine.   PVD (peripheral  vascular disease) (HCC) Chronic edema BLE 1+  Acquired hypothyroidism TSH 2.9 10/2018, continue Levothyroxine, update TSH  OA (osteoarthritis) of knee Continue Tylenol, Tramadol  Chronic back pain Continue Tylenol, Tramadol, Gabapentin, Methocarbamol.      Family/ staff Communication: plan of care reviewed with the patient and charge nurse.   Labs/tests ordered: CBC/diff, CMP/eGFR, TSH  Time spend 40 minutes.

## 2019-09-14 NOTE — Assessment & Plan Note (Signed)
Her mood is stable, continue Alprazolam, Wellbutrin, Sertraline.

## 2019-09-14 NOTE — Assessment & Plan Note (Signed)
Chronic edema BLE 1+

## 2019-09-14 NOTE — Assessment & Plan Note (Addendum)
Continue Tylenol, Tramadol, Gabapentin, Methocarbamol.

## 2019-09-14 NOTE — Assessment & Plan Note (Addendum)
Mild resting tremor mostly in right fingers, not disabling, metabolic vs EPS vs neurological etiology, will obtain CBC/diff, CMP/eGFR, TSH. GDR of psychotropic meds if no better. Observe.

## 2019-09-14 NOTE — Assessment & Plan Note (Signed)
Blood pressure is controlled, continue Losartan, Hydralazine.  

## 2019-09-14 NOTE — Assessment & Plan Note (Signed)
Continue Tylenol, Tramadol.  

## 2019-09-17 DIAGNOSIS — E871 Hypo-osmolality and hyponatremia: Secondary | ICD-10-CM | POA: Diagnosis not present

## 2019-09-17 DIAGNOSIS — D649 Anemia, unspecified: Secondary | ICD-10-CM | POA: Diagnosis not present

## 2019-09-17 DIAGNOSIS — E039 Hypothyroidism, unspecified: Secondary | ICD-10-CM | POA: Diagnosis not present

## 2019-09-17 DIAGNOSIS — E785 Hyperlipidemia, unspecified: Secondary | ICD-10-CM | POA: Diagnosis not present

## 2019-09-17 LAB — BASIC METABOLIC PANEL
BUN: 29 — AB (ref 4–21)
CO2: 28 — AB (ref 13–22)
Chloride: 105 (ref 99–108)
Creatinine: 1.4 — AB (ref 0.5–1.1)
Glucose: 81
Potassium: 4.2 (ref 3.4–5.3)
Sodium: 140 (ref 137–147)

## 2019-09-17 LAB — CBC AND DIFFERENTIAL
HCT: 27 — AB (ref 36–46)
Hemoglobin: 8.2 — AB (ref 12.0–16.0)
Neutrophils Absolute: 1406
Platelets: 72 — AB (ref 150–399)
WBC: 2.8

## 2019-09-17 LAB — HEPATIC FUNCTION PANEL
ALT: 4 — AB (ref 7–35)
AST: 12 — AB (ref 13–35)
Alkaline Phosphatase: 104 (ref 25–125)
Bilirubin, Total: 0.3

## 2019-09-17 LAB — CBC: RBC: 3.06 — AB (ref 3.87–5.11)

## 2019-09-17 LAB — COMPREHENSIVE METABOLIC PANEL
Albumin: 4 (ref 3.5–5.0)
Calcium: 9.8 (ref 8.7–10.7)
Globulin: 3.2

## 2019-09-17 LAB — TSH: TSH: 3.63 (ref 0.41–5.90)

## 2019-09-21 DIAGNOSIS — L84 Corns and callosities: Secondary | ICD-10-CM | POA: Diagnosis not present

## 2019-09-21 DIAGNOSIS — M79671 Pain in right foot: Secondary | ICD-10-CM | POA: Diagnosis not present

## 2019-09-21 DIAGNOSIS — B351 Tinea unguium: Secondary | ICD-10-CM | POA: Diagnosis not present

## 2019-09-21 DIAGNOSIS — M79672 Pain in left foot: Secondary | ICD-10-CM | POA: Diagnosis not present

## 2019-09-28 ENCOUNTER — Other Ambulatory Visit: Payer: Self-pay

## 2019-09-28 MED ORDER — ALPRAZOLAM 0.25 MG PO TABS: 0.2500 mg | ORAL_TABLET | Freq: Every day | ORAL | 0 refills | Status: DC

## 2019-09-28 NOTE — Telephone Encounter (Signed)
Fax was sent over by [facility] facility stating that patient needs more Alprazolam. Patient doesn't have Opioid Contract on file. Medication pend and routed to provider.

## 2019-10-01 DIAGNOSIS — D649 Anemia, unspecified: Secondary | ICD-10-CM | POA: Diagnosis not present

## 2019-10-01 LAB — CBC AND DIFFERENTIAL
HCT: 27 — AB (ref 36–46)
Hemoglobin: 8.2 — AB (ref 12.0–16.0)
Neutrophils Absolute: 1204
Platelets: 76 — AB (ref 150–399)
WBC: 2.7

## 2019-10-01 LAB — CBC: RBC: 3.08 — AB (ref 3.87–5.11)

## 2019-10-02 ENCOUNTER — Encounter: Payer: Self-pay | Admitting: General Practice

## 2019-10-23 ENCOUNTER — Non-Acute Institutional Stay: Payer: Medicare Other | Admitting: Nurse Practitioner

## 2019-10-23 ENCOUNTER — Encounter: Payer: Self-pay | Admitting: Nurse Practitioner

## 2019-10-23 DIAGNOSIS — I10 Essential (primary) hypertension: Secondary | ICD-10-CM | POA: Diagnosis not present

## 2019-10-23 DIAGNOSIS — R1032 Left lower quadrant pain: Secondary | ICD-10-CM | POA: Insufficient documentation

## 2019-10-23 DIAGNOSIS — K5909 Other constipation: Secondary | ICD-10-CM

## 2019-10-23 DIAGNOSIS — M545 Low back pain, unspecified: Secondary | ICD-10-CM

## 2019-10-23 DIAGNOSIS — I739 Peripheral vascular disease, unspecified: Secondary | ICD-10-CM

## 2019-10-23 DIAGNOSIS — G8929 Other chronic pain: Secondary | ICD-10-CM

## 2019-10-23 DIAGNOSIS — D638 Anemia in other chronic diseases classified elsewhere: Secondary | ICD-10-CM

## 2019-10-23 DIAGNOSIS — K219 Gastro-esophageal reflux disease without esophagitis: Secondary | ICD-10-CM | POA: Diagnosis not present

## 2019-10-23 DIAGNOSIS — N3090 Cystitis, unspecified without hematuria: Secondary | ICD-10-CM

## 2019-10-23 DIAGNOSIS — R21 Rash and other nonspecific skin eruption: Secondary | ICD-10-CM | POA: Insufficient documentation

## 2019-10-23 DIAGNOSIS — F339 Major depressive disorder, recurrent, unspecified: Secondary | ICD-10-CM

## 2019-10-23 DIAGNOSIS — E039 Hypothyroidism, unspecified: Secondary | ICD-10-CM

## 2019-10-23 DIAGNOSIS — N183 Chronic kidney disease, stage 3 unspecified: Secondary | ICD-10-CM

## 2019-10-23 NOTE — Assessment & Plan Note (Signed)
Heat and moist are contributory, apply Nystatin powder qd to under R+L breast skin folds x 4 weeks,

## 2019-10-23 NOTE — Assessment & Plan Note (Signed)
Blood pressure is controlled, continue Losartan 

## 2019-10-23 NOTE — Assessment & Plan Note (Signed)
09/17/19 eGFR 35

## 2019-10-23 NOTE — Assessment & Plan Note (Signed)
09/17/19 Hgb 8.2, 08/20/19 Hgb 7.8, pending f/u CBC, continue Fe

## 2019-10-23 NOTE — Assessment & Plan Note (Signed)
Chronic trace edema, continue Torsemide.

## 2019-10-23 NOTE — Assessment & Plan Note (Signed)
Stable, continue Colace bid,  prn MIraLax, Fiber powder daily.

## 2019-10-23 NOTE — Assessment & Plan Note (Signed)
Stable, continue Omeprazole.  

## 2019-10-23 NOTE — Progress Notes (Signed)
Location:   [facility] [place] Room Number: 39-A Place of Service: [facility] Provider:  [facility] Lenola Lockner NP  Virgie Dad, MD  Patient Care Team: Virgie Dad, MD as PCP - General (Internal Medicine) Azoria Abbett X, NP as Nurse Practitioner (Internal Medicine)  Extended Emergency Contact Information Primary Emergency Contact: Dudas,[name] Address: [address]          [place],  [address] [place] Phone: [phone] Mobile Phone: [phone] Relation: Son Secondary Emergency Contact: Eldredge,[name]  [place] Phone: [phone] Relation: None  Code Status:  DNR Goals of care: Advanced Directive information Advanced Directives 10/23/2019  Does Patient Have a Medical Advance Directive? Yes  Type of Advance Directive [place]  Does patient want to make changes to medical advance directive? No - Patient declined  Copy of [place] in Chart? Yes - validated most recent copy scanned in chart (See row information)  Pre-existing out of facility DNR order (yellow form or pink MOST form) -     Chief Complaint  Patient presents with  . Medical Management of Chronic Issues    Routine Visit     HPI:  Pt is a 86 y.o. female seen today for medical management of chronic diseases.    The patient resides in [facility] for safety, care assistance, ambulates with walker, chronic back pain, managed with Gabapentin 161m tid, prn Tylenol, Methocarbamol 250g qd prn Tramadol. PVD, trace edema BLE, on Torsemide 261mqd. Her mood is stable, on Sertraline 2570md, Wellbutrin 150m57md, Alprazolam 0.25mg57m.  GERD, stable, on Omeprazole 40mg 32mConstipation, stable, on Colace bid,  prn MIraLax, Fiber powder daily. UTI suppression therapy, on Nitrofurantoin 50mg q24mTN, blood pressure is controlled, on Losartan 100mg qd56mpothyroidism, stable, on Levothyroxine 75mcg qd95memia, stable, on Fe.    Past Medical History:    Diagnosis Date  . Anemia   . Anxiety   . Chronotropic incompetence 12/2012    potentially medication related; noted on CPET   . DDD (degenerative disc disease)   . Eczema   . GERD (gastroesophageal reflux disease)   . H/O hiatal hernia   . Hx: UTI (urinary tract infection)   . Hyperlipidemia   . Hypertension   . Hypothyroidism   . Osteopenia   . Septicemia (HCC) 2002Arvadafollowing UTI  . Vertigo, benign positional    Past Surgical History:  Procedure Laterality Date  . BREAST SURGERY     left biopsy  . CATARACT EXTRACTION Right    2 weeks ago  . CPET / MET - PFTS     Consistent with chronotropic incompetence; See attached report in the results section  . DILATION AND CURETTAGE OF UTERUS     x 2  . DOPPLER ECHOCARDIOGRAPHY  01/10/2013   Normal LV size and function. Normal EF. Air sclerosis but no stenosis  . ESOPHAGOGASTRODUODENOSCOPY  05/04/2012   Procedure: ESOPHAGOGASTRODUODENOSCOPY (EGD);  Surgeon: Robert D Inda Castlecation: [facility] ENDOSCDirk DressY;  Service: Endoscopy;  Laterality: N/A;  . EYE SURGERY     cataract extraction with ILO  ? eye  . IR KYPHO EA ADDL LEVEL THORACIC OR LUMBAR  10/28/2016  . IR KYPHO LUMBAR INC FX REDUCE BONE BX UNI/BIL CANNULATION INC/IMAGING  10/28/2016  . IR KYPHO THORACIC WITH BONE BIOPSY  12/24/2016  . IR RADIOLOGIST EVAL & MGMT  11/11/2016  . KNEE ARTHROSCOPY  2011   right  .  TONSILLECTOMY    . TOTAL KNEE ARTHROPLASTY  04/24/2012   Procedure: TOTAL KNEE ARTHROPLASTY;  Surgeon: Gearlean Alf, MD;  Location: [facility];  Service: Orthopedics;  Laterality: Right;  . TRANSTHORACIC ECHOCARDIOGRAM  02/2018   Ordered by PCP for "congestive heart failure " -EF 60 M 65%.  GR 1 DD.  No R WMA--- > ESSENTIALLY NORMAL    No Known Allergies  Allergies as of 10/23/2019   No Known Allergies     Medication List       Accurate as of October 23, 2019  2:00 PM. If you have any questions, ask your nurse or doctor.        STOP taking these medications    hydrALAZINE 25 MG tablet Commonly known as: APRESOLINE Stopped by: Vihaan Gloss X Bailyn Spackman, NP     TAKE these medications   ALPRAZolam 0.25 MG tablet Commonly known as: XANAX Take 1 tablet (0.25 mg total) by mouth at bedtime.   atorvastatin 10 MG tablet Commonly known as: LIPITOR Take 5 mg by mouth daily.   buPROPion 150 MG 12 hr tablet Commonly known as: WELLBUTRIN SR Take 1 tablet (150 mg total) by mouth 2 (two) times daily after a meal.   Camphor-Menthol-Methyl Sal 3.07-31-08 % Ptch Place 1 patch onto the skin. Apply one to lower back daily for back pain   CITRACAL +D3 PO Take 2 capsules by mouth 2 (two) times a day. 67m-25mcg(1000u)   docusate sodium 100 MG capsule Commonly known as: COLACE Take 100 mg by mouth 2 (two) times daily.   ferrous sulfate 325 (65 FE) MG EC tablet Take 325 mg by mouth every Monday, Wednesday, and Friday. With a meal   Fiber Powd Take 15 mLs by mouth daily. Mix with 4-8 oz of water   fish oil-omega-3 fatty acids 1000 MG capsule Take 1 g by mouth daily.   gabapentin 100 MG capsule Commonly known as: NEURONTIN Take 100 mg by mouth 3 (three) times daily.   Klor-Con M20 20 MEQ tablet Generic drug: potassium chloride SA TAKE 1 TABLET BY MOUTH TWICE A DAY   levothyroxine 75 MCG tablet Commonly known as: SYNTHROID Take 1 tablet (75 mcg total) by mouth daily before breakfast.   losartan 100 MG tablet Commonly known as: COZAAR Take 1 tablet (100 mg total) by mouth daily.   MELATONIN + L-THEANINE PO See admin instructions. Olly Sleep Gummy Melatonin 326m L-Theanine 10059mand botanicals, blackberry Zen, Special Instructions: Take 2 gummies PO QHS as sleep aide.   methocarbamol 500 MG tablet Commonly known as: ROBAXIN Take 250 mg by mouth daily as needed. For muscle spasms   nitrofurantoin 50 MG capsule Commonly known as: MACRODANTIN Take 1 capsule (50 mg total) by mouth daily.   nystatin powder Commonly known as: MYCOSTATIN/NYSTOP Apply  100,000 g topically 2 (two) times daily as needed.   omeprazole 40 MG capsule Commonly known as: PRILOSEC Take 40 mg by mouth daily.   polyethylene glycol 17 g packet Commonly known as: MIRALAX / GLYCOLAX Take 17 g by mouth as directed. On Monday and Thursday   sertraline 25 MG tablet Commonly known as: ZOLOFT Take 25 mg by mouth daily.   torsemide 20 MG tablet Commonly known as: DEMADEX Take 20 mg by mouth daily.   traMADol 50 MG tablet Commonly known as: ULTRAM Take 50 mg by mouth every 6 (six) hours as needed.   TYLENOL 500 MG tablet Generic drug: acetaminophen Take 1,000 mg by mouth 3 (three) times daily  as needed.       Review of Systems  Constitutional: Positive for fatigue. Negative for activity change, appetite change, fever and unexpected weight change.       Chronic fatigue.   HENT: Positive for hearing loss. Negative for congestion and voice change.   Eyes: Negative for visual disturbance.  Respiratory: Negative for cough, shortness of breath and wheezing.   Cardiovascular: Positive for leg swelling. Negative for chest pain and palpitations.  Gastrointestinal: Positive for abdominal pain. Negative for abdominal distention, constipation, nausea and vomiting.       LLQ pain associated with BM, on and off without intervention.   Genitourinary: Negative for difficulty urinating, dysuria and urgency.  Musculoskeletal: Positive for arthralgias, back pain and gait problem.  Skin: Positive for pallor and rash. Negative for color change.  Neurological: Positive for dizziness. Negative for tremors, facial asymmetry, speech difficulty, weakness and headaches.       Hx of vertigo, chronic  Psychiatric/Behavioral: Negative for agitation, behavioral problems, hallucinations and sleep disturbance. The patient is not nervous/anxious.     Immunization History  Administered Date(s) Administered  . Influenza, High Dose Seasonal PF 04/26/2016, 03/22/2017  . Influenza,inj,Quad  PF,6+ Mos 04/27/2018  . Influenza-Unspecified 04/22/2011, 04/04/2012, 05/03/2013, 05/04/2013, 04/17/2014, 04/19/2015, 04/26/2016, 04/22/2017  . Moderna SARS-COVID-2 Vaccination 07/30/2019, 08/27/2019  . Pneumococcal Polysaccharide-23 08/26/2003, 09/06/2011, 05/15/2019  . Pneumococcal-Unspecified 12/20/2013  . Td 09/26/2006  . Tdap 05/15/2019  . Zoster 10/25/2007  . Zoster Recombinat (Shingrix) 11/21/2017, 04/08/2018   Pertinent  Health Maintenance Due  Topic Date Due  . PNA vac Low Risk Adult (2 of 2 - PCV13) 05/14/2020  . INFLUENZA VACCINE  Completed  . DEXA SCAN  Completed   Fall Risk  10/18/2018 08/23/2018 05/09/2018 05/01/2018 04/10/2018  Falls in the past year? 0 0 Yes Yes No  Comment - - - fell in June/2019 _0  Home (asst living) -  Number falls in past yr: 0 0 1 1 -  Injury with Fall? 0 0 No No -  Risk for fall due to : - - - Impaired balance/gait -  Follow up - - - Falls prevention discussed -   Functional Status Survey:    Vitals:   10/23/19 1055 10/23/19 1056  BP: (!) 105/59 137/68  Pulse: 72   Resp: 16   Temp: (!) 96.4 F (35.8 C)   SpO2: 90%   Weight: 178 lb 9.6 oz (81 kg)   Height: _1  (1.626 m)    Body mass index is 30.66 kg/m. Physical Exam Vitals and nursing note reviewed.  Constitutional:      General: She is not in acute distress.    Appearance: Normal appearance. She is not ill-appearing.  HENT:     Head: Normocephalic and atraumatic.     Nose: Nose normal.     Mouth/Throat:     Mouth: Mucous membranes are moist.  Eyes:     Extraocular Movements: Extraocular movements intact.     Conjunctiva/sclera: Conjunctivae normal.     Pupils: Pupils are equal, round, and reactive to light.  Cardiovascular:     Rate and Rhythm: Normal rate and regular rhythm.     Heart sounds: No murmur.  Pulmonary:     Breath sounds: No wheezing.  Abdominal:     General: Bowel sounds are normal. There is no distension.     Palpations: Abdomen is soft.      Tenderness: There is no abdominal tenderness. There is no right CVA tenderness, left CVA tenderness, guarding  or rebound.  Musculoskeletal:     Cervical back: Normal range of motion and neck supple.     Right lower leg: Edema present.     Left lower leg: Edema present.     Comments: Edema trace to 1+ BLE  Skin:    General: Skin is warm and dry.     Findings: Erythema and rash present.     Comments: Under R+L breast skin folds.   Neurological:     General: No focal deficit present.     Mental Status: She is alert. Mental status is at baseline.     Motor: No weakness.     Coordination: Coordination abnormal.     Gait: Gait abnormal.     Comments: Oriented to person, place.   Psychiatric:        Mood and Affect: Mood normal.        Behavior: Behavior normal.        Thought Content: Thought content normal.        Judgment: Judgment normal.     Labs reviewed: Recent Labs    11/23/18 0000 01/04/19 0000 08/07/19 0000  NA 134* 138 143  K 4.1 3.9 4.3  CL 96*  --  108  CO2 31  --  27*  GLUCOSE 88  --   --   BUN 27* 28* 31*  CREATININE 1.30* 1.2* 1.3*  CALCIUM 10.2  --   --    Recent Labs    11/23/18 0000 01/04/19 0000 08/07/19 0000  AST _0 ALT _1 ALKPHOS  --   --  102  BILITOT 0.3  --   --   PROT 7.6  --   --    Recent Labs    11/23/18 0000 01/04/19 0000 01/19/19 0000 08/07/19 0000 08/20/19 0000  WBC 3.6*  --  3.6 3.3 3.4  NEUTROABS 2,048  --   --   --   --   HGB 10.7*   < > 8.9* 8.2* 7.8*  HCT 32.8*   < > 28* 27* 26*  MCV 85.4  --   --   --   --   PLT 88*   < > 74* 59* 65*   < > = values in this interval not displayed.   Lab Results  Component Value Date   TSH 2.90 11/23/2018   No results found for: HGBA1C Lab Results  Component Value Date   CHOL 227 (H) 11/23/2018   HDL 60 11/23/2018   LDLCALC 141 (H) 11/23/2018   TRIG 140 11/23/2018   CHOLHDL 3.8 11/23/2018    Significant Diagnostic Results in last 30 days:  No results  found.  Assessment/Plan  Essential hypertension Blood pressure is controlled, continue Losartan  PVD (peripheral vascular disease) (HCC) Chronic trace edema, continue Torsemide.   Chronic constipation Stable, continue Colace bid,  prn MIraLax, Fiber powder daily.   GERD (gastroesophageal reflux disease) Stable, continue Omeprazole.   Acquired hypothyroidism Stable, continue Levotyroxine 65mg qd, 09/17/19 TSH 3.62  CKD (chronic kidney disease) stage 3, GFR 30-59 ml/min 09/17/19 eGFR 35  Recurrent cystitis Stable, continue UTI suppression therapy, continue Nitrofurantoin 573mqd.   Anemia of chronic disease 09/17/19 Hgb 8.2, 08/20/19 Hgb 7.8, pending f/u CBC, continue Fe  Chronic bilateral low back pain without sciatica Managed, continue Gabapentin 10051mid, prn Tylenol, Methocarbamol 250g qd prn Tramadol  Depression, recurrent (HCC) Stable, continue Sertraline 81m1m, Wellbutrin 150mg2m, Alprazolam 0.81mg 52m   Rash Heat  and moist are contributory, apply Nystatin powder qd to under R+L breast skin folds x 4 weeks,   Intermittent left lower quadrant abdominal pain Not new, comes and goes, usually associated with BMP, no nausea, vomiting, change of appetite noted, she is afebrile, unable to do CT due to her back pain. X-ray abd unremarkable 08/2019   Family/ staff Communication: plan of care reviewed with the patient and charge nurse.   Labs/tests ordered:  none  Time spend 40 minutes.

## 2019-10-23 NOTE — Assessment & Plan Note (Signed)
Stable, continue UTI suppression therapy, continue Nitrofurantoin 50mg  qd.

## 2019-10-23 NOTE — Assessment & Plan Note (Signed)
Stable, continue Sertraline 25mg  qd, Wellbutrin 150mg  bid, Alprazolam 0.25mg  qhs.

## 2019-10-23 NOTE — Progress Notes (Signed)
This encounter was created in error - please disregard.

## 2019-10-23 NOTE — Assessment & Plan Note (Addendum)
Stable, continue Levotyroxine qd, 09/17/19 TSH 3.62

## 2019-10-23 NOTE — Assessment & Plan Note (Signed)
Managed, continue Gabapentin 100mg  tid, prn Tylenol, Methocarbamol 250g qd prn Tramadol

## 2019-10-23 NOTE — Assessment & Plan Note (Addendum)
Not new, comes and goes, usually associated with BMP, no nausea, vomiting, change of appetite noted, she is afebrile, unable to do CT due to her back pain. X-ray abd unremarkable 08/2019

## 2019-11-05 ENCOUNTER — Non-Acute Institutional Stay: Payer: Medicare Other | Admitting: Nurse Practitioner

## 2019-11-05 ENCOUNTER — Encounter: Payer: Self-pay | Admitting: Nurse Practitioner

## 2019-11-05 DIAGNOSIS — K644 Residual hemorrhoidal skin tags: Secondary | ICD-10-CM | POA: Diagnosis not present

## 2019-11-05 DIAGNOSIS — R21 Rash and other nonspecific skin eruption: Secondary | ICD-10-CM

## 2019-11-05 DIAGNOSIS — I10 Essential (primary) hypertension: Secondary | ICD-10-CM | POA: Diagnosis not present

## 2019-11-05 DIAGNOSIS — K5909 Other constipation: Secondary | ICD-10-CM

## 2019-11-05 NOTE — Assessment & Plan Note (Signed)
Stable, continue prn MiraLax, continue Colace bid, daily Fiber

## 2019-11-05 NOTE — Assessment & Plan Note (Signed)
No injury or bleeding, avoid constipation.

## 2019-11-05 NOTE — Assessment & Plan Note (Signed)
Blood pressure is controlled, continue Losartan 100mg qd.  

## 2019-11-05 NOTE — Progress Notes (Signed)
Location:     [facility] Room Number: 20 Place of Service:  ALF (13) Provider: Lennie Odor Goldy Calandra NP  Virgie Dad, MD  Patient Care Team: Virgie Dad, MD as PCP - General (Internal Medicine) Lucianne Smestad X, NP as Nurse Practitioner (Internal Medicine)  Extended Emergency Contact Information Primary Emergency Contact: Bar,[name] Address: [address]          [place], [place] [address] [place] Phone: [phone] Mobile Phone: [phone] Relation: Son Secondary Emergency Contact: Chretien,[name]  [place] Phone: [phone] Relation: None  Code Status:  DNR Goals of care: Advanced Directive information Advanced Directives 11/05/2019  Does Patient Have a Medical Advance Directive? Yes  Type of Advance Directive Out of facility DNR (pink MOST or yellow form)  Does patient want to make changes to medical advance directive? No - Patient declined  Copy of [place] in Chart? -  Pre-existing out of facility DNR order (yellow form or pink MOST form) Yellow form placed in chart (order not valid for inpatient use)     Chief Complaint  Patient presents with  . Acute Visit    Perineal irritation    HPI:  Pt is a 86 y.o. female seen today for an acute visit for c/o perineal area irritation, denied burning sensation, abd pain, she is afebrile. Hx of recurrent UTI, currently on Nitrofurantoin 34m qd for UTI suppression therapy. HTN, blood pressure is controlled on Losartan 1069mqd. Hemorrhoids, no injury. No constipation while taking prn MiraLax, Fiber daily, Colace bid.    Past Medical History:  Diagnosis Date  . Anemia   . Anxiety   . Chronotropic incompetence 12/2012    potentially medication related; noted on CPET   . DDD (degenerative disc disease)   . Eczema   . GERD (gastroesophageal reflux disease)   . H/O hiatal hernia   . Hx: UTI (urinary tract infection)   . Hyperlipidemia   . Hypertension   .  Hypothyroidism   . Osteopenia   . [place]   [place]  . Vertigo, benign positional    Past Surgical History:  Procedure Laterality Date  . BREAST SURGERY     left biopsy  . CATARACT EXTRACTION Right    2 weeks ago  . CPET / MET - PFTS     Consistent with chronotropic incompetence; See attached report in the results section  . DILATION AND CURETTAGE OF UTERUS     x 2  . DOPPLER ECHOCARDIOGRAPHY  01/10/2013   Normal LV size and function. Normal EF. Air sclerosis but no stenosis  . ESOPHAGOGASTRODUODENOSCOPY  05/04/2012   Procedure: ESOPHAGOGASTRODUODENOSCOPY (EGD);  Surgeon: RoInda CastleMD;  [facility];  Service: Endoscopy;  Laterality: N/A;  . EYE SURGERY     cataract extraction with ILO  ? eye  . IR KYPHO EA ADDL LEVEL THORACIC OR LUMBAR  10/28/2016  . IR KYPHO LUMBAR INC FX REDUCE BONE BX UNI/BIL CANNULATION INC/IMAGING  10/28/2016  . IR KYPHO THORACIC WITH BONE BIOPSY  12/24/2016  . IR RADIOLOGIST EVAL & MGMT  11/11/2016  . KNEE ARTHROSCOPY  2011   right  . TONSILLECTOMY    . TOTAL KNEE ARTHROPLASTY  04/24/2012   Procedure: TOTAL KNEE ARTHROPLASTY;  Surgeon: FrGearlean AlfMD;  [facility]: [facility];  Service: Orthopedics;  Laterality: Right;  . TRANSTHORACIC ECHOCARDIOGRAM  02/2018   Ordered by PCP for "congestive heart failure " -EF 6074  65%.  GR 1 DD.  No R WMA--- > ESSENTIALLY NORMAL    No Known Allergies  Allergies as of 11/05/2019   No Known Allergies     Medication List       Accurate as of November 05, 2019 11:59 PM. If you have any questions, ask your nurse or doctor.        STOP taking these medications   MELATONIN + L-THEANINE PO Stopped by: Jailey Booton X Theressa Piedra, NP     TAKE these medications   ALPRAZolam 0.25 MG tablet Commonly known as: XANAX Take 1 tablet (0.25 mg total) by mouth at bedtime.   atorvastatin 10 MG tablet Commonly known as: LIPITOR Take 5 mg by mouth daily.   buPROPion 150 MG 12 hr tablet Commonly known as:  WELLBUTRIN SR Take 1 tablet (150 mg total) by mouth 2 (two) times daily after a meal.   Camphor-Menthol-Methyl Sal 3.07-31-08 % Ptch Place 1 patch onto the skin. Apply one to lower back daily for back pain   CITRACAL +D3 PO Take 2 capsules by mouth 2 (two) times a day. 653m-25mcg(1000u)   docusate sodium 100 MG capsule Commonly known as: COLACE Take 100 mg by mouth 2 (two) times daily.   ferrous sulfate 325 (65 FE) MG EC tablet Take 325 mg by mouth every Monday, Wednesday, and Friday. With a meal   Fiber Powd Take 15 mLs by mouth daily. Mix with 4-8 oz of water   fish oil-omega-3 fatty acids 1000 MG capsule Take 1 g by mouth daily.   gabapentin 100 MG capsule Commonly known as: NEURONTIN Take 100 mg by mouth 3 (three) times daily.   Klor-Con M20 20 MEQ tablet Generic drug: potassium chloride SA TAKE 1 TABLET BY MOUTH TWICE A DAY   levothyroxine 75 MCG tablet Commonly known as: SYNTHROID Take 1 tablet (75 mcg total) by mouth daily before breakfast.   losartan 100 MG tablet Commonly known as: COZAAR Take 1 tablet (100 mg total) by mouth daily.   methocarbamol 500 MG tablet Commonly known as: ROBAXIN Take 250 mg by mouth daily as needed. For muscle spasms   nitrofurantoin 50 MG capsule Commonly known as: MACRODANTIN Take 1 capsule (50 mg total) by mouth daily.   nystatin powder Commonly known as: MYCOSTATIN/NYSTOP Apply 100,000 g topically 2 (two) times daily as needed.   nystatin cream Commonly known as: MYCOSTATIN Apply 1 application topically 2 (two) times daily. 100,000 unit/gram   omeprazole 40 MG capsule Commonly known as: PRILOSEC Take 40 mg by mouth daily.   polyethylene glycol 17 g packet Commonly known as: MIRALAX / GLYCOLAX Take 17 g by mouth as directed. On Monday and Thursday   sertraline 25 MG tablet Commonly known as: ZOLOFT Take 25 mg by mouth daily.   torsemide 20 MG tablet Commonly known as: DEMADEX Take 20 mg by mouth daily.     traMADol 50 MG tablet Commonly known as: ULTRAM Take 50 mg by mouth every 6 (six) hours as needed.   triamcinolone cream 0.5 % Commonly known as: KENALOG Apply 1 application topically 2 (two) times daily.   TYLENOL 500 MG tablet Generic drug: acetaminophen Take 1,000 mg by mouth 3 (three) times daily as needed.       Review of Systems  Constitutional: Negative for activity change, appetite change, fatigue and fever.       Chronic fatigue.   HENT: Positive for hearing loss. Negative for congestion and voice change.   Eyes: Negative for visual  disturbance.  Respiratory: Positive for shortness of breath. Negative for cough and wheezing.        DOE  Cardiovascular: Positive for leg swelling. Negative for chest pain and palpitations.  Gastrointestinal: Negative for abdominal distention, abdominal pain, constipation, nausea and vomiting.       LLQ pain associated with BM, on and off without intervention.   Genitourinary: Negative for difficulty urinating, dysuria and urgency.  Musculoskeletal: Positive for arthralgias, back pain and gait problem.  Skin: Positive for rash. Negative for color change and pallor.       Perineal, urogenital area redness  Neurological: Negative for tremors, facial asymmetry, speech difficulty and light-headedness.       Hx of vertigo, chronic  Psychiatric/Behavioral: Negative for agitation, behavioral problems and sleep disturbance. The patient is not nervous/anxious.     Immunization History  Administered Date(s) Administered  . Influenza, High Dose Seasonal PF 04/26/2016, 03/22/2017, 05/08/2019  . Influenza,inj,Quad PF,6+ Mos 04/27/2018  . Influenza-Unspecified 04/22/2011, 04/04/2012, 05/03/2013, 05/04/2013, 04/17/2014, 04/19/2015, 04/26/2016, 04/22/2017  . Moderna SARS-COVID-2 Vaccination 07/30/2019, 08/27/2019  . Pneumococcal Polysaccharide-23 08/26/2003, 09/06/2011, 05/15/2019  . Pneumococcal-Unspecified 12/20/2013  . Td 09/26/2006  . Tdap  05/15/2019  . Zoster 10/25/2007  . Zoster Recombinat (Shingrix) 11/21/2017, 04/08/2018   Pertinent  Health Maintenance Due  Topic Date Due  . INFLUENZA VACCINE  02/24/2020  . PNA vac Low Risk Adult (2 of 2 - PCV13) 05/14/2020  . DEXA SCAN  Completed   Fall Risk  10/18/2018 08/23/2018 05/09/2018 05/01/2018 04/10/2018  Falls in the past year? 0 0 Yes Yes No  Comment - - - [facility]/2019 [facility] -  Number falls in past yr: 0 0 1 1 -  Injury with Fall? 0 0 No No -  Risk for fall due to : - - - Impaired balance/gait -  Follow up - - - Falls prevention discussed -   Functional Status Survey:    Vitals:   11/05/19 1449  BP: 114/66  Pulse: 62  Resp: 18  Temp: (!) 96.8 F (36 C)  SpO2: 95%  Weight: 179 lb 3.2 oz (81.3 kg)  Height: '5\' 4"'  (1.626 m)   Body mass index is 30.76 kg/m. Physical Exam Vitals and nursing note reviewed.  Constitutional:      Appearance: Normal appearance.  HENT:     Head: Normocephalic and atraumatic.  Eyes:     Extraocular Movements: Extraocular movements intact.     Conjunctiva/sclera: Conjunctivae normal.     Pupils: Pupils are equal, round, and reactive to light.  Cardiovascular:     Rate and Rhythm: Normal rate and regular rhythm.     Heart sounds: No murmur.  Pulmonary:     Breath sounds: No wheezing.  Abdominal:     General: Bowel sounds are normal. There is no distension.     Palpations: Abdomen is soft.     Tenderness: There is no abdominal tenderness. There is no right CVA tenderness, left CVA tenderness, guarding or rebound.  Genitourinary:    Vagina: No vaginal discharge.     Comments: External hemorrhoid, no injury or bleeding.  Musculoskeletal:     Cervical back: Normal range of motion and neck supple.     Right lower leg: Edema present.     Left lower leg: Edema present.     Comments: Edema trace to 1+ BLE  Skin:    General: Skin is warm and dry.     Findings: Erythema present.  Comments:  Urogenital/perineal area redness, irritation.   Neurological:     General: No focal deficit present.     Mental Status: She is alert. Mental status is at baseline.     Motor: No weakness.     Coordination: Coordination abnormal.     Gait: Gait abnormal.     Comments: Oriented to person, place.   Psychiatric:        Mood and Affect: Mood normal.        Behavior: Behavior normal.        Thought Content: Thought content normal.        Judgment: Judgment normal.     Labs reviewed: Recent Labs    11/23/18 0000 11/23/18 0000 01/04/19 0000 08/07/19 0000 09/17/19 0000  NA 134*  --  138 143 140  K 4.1   < > 3.9 4.3 4.2  CL 96*  --   --  108 105  CO2 31  --   --  27* 28*  GLUCOSE 88  --   --   --   --   BUN 27*  --  28* 31* 29*  CREATININE 1.30*  --  1.2* 1.3* 1.4*  CALCIUM 10.2  --   --   --  9.8   < > = values in this interval not displayed.   Recent Labs    11/23/18 0000 11/23/18 0000 01/04/19 0000 08/07/19 0000 09/17/19 0000  AST 29   < > 21 24 12*  ALT 22   < > 17 17 4*  ALKPHOS  --   --   --  102 104  BILITOT 0.3  --   --   --   --   PROT 7.6  --   --   --   --   ALBUMIN  --   --   --   --  4.0   < > = values in this interval not displayed.   Recent Labs    11/23/18 0000 01/04/19 0000 08/20/19 0000 09/17/19 0000 10/01/19 0000  WBC 3.6*   < > 3.4 2.8 2.7  NEUTROABS 2,048  --   --  1,406 1,204  HGB 10.7*   < > 7.8* 8.2* 8.2*  HCT 32.8*   < > 26* 27* 27*  MCV 85.4  --   --   --   --   PLT 88*   < > 65* 72* 76*   < > = values in this interval not displayed.   Lab Results  Component Value Date   TSH 3.63 09/17/2019   No results found for: HGBA1C Lab Results  Component Value Date   CHOL 227 (H) 11/23/2018   HDL 60 11/23/2018   LDLCALC 141 (H) 11/23/2018   TRIG 140 11/23/2018   CHOLHDL 3.8 11/23/2018    Significant Diagnostic Results in last 30 days:  No results found.  Assessment/Plan Rash In urogenital, perineal areas, redness, no skin  breakdown, no vaginal discharge, will apply Nystatin cream bid, 0.5% Triamcinolone cream bid to affected areas x 7 days  Essential hypertension Blood pressure is controlled, continue Losartan 183m qd.   Chronic constipation Stable, continue prn MiraLax, continue Colace bid, daily Fiber  External hemorrhoids No injury or bleeding, avoid constipation.     Family/ staff Communication: plan of care reviewed with the patient and charge nurse.   Labs/tests ordered:  none  Time spend 40 minutes.

## 2019-11-05 NOTE — Assessment & Plan Note (Signed)
In urogenital, perineal areas, redness, no skin breakdown, no vaginal discharge, will apply Nystatin cream bid, 0.5% Triamcinolone cream bid to affected areas x 7 days

## 2019-11-06 ENCOUNTER — Encounter: Payer: Self-pay | Admitting: Nurse Practitioner

## 2019-11-26 DIAGNOSIS — D649 Anemia, unspecified: Secondary | ICD-10-CM | POA: Diagnosis not present

## 2019-11-26 LAB — CBC AND DIFFERENTIAL
HCT: 28 — AB (ref 36–46)
Hemoglobin: 8.7 — AB (ref 12.0–16.0)
Platelets: 73 — AB (ref 150–399)
WBC: 4.1

## 2019-11-26 LAB — CBC: RBC: 3.26 — AB (ref 3.87–5.11)

## 2019-11-30 ENCOUNTER — Other Ambulatory Visit: Payer: Self-pay | Admitting: *Deleted

## 2019-11-30 MED ORDER — ALPRAZOLAM 0.25 MG PO TABS: 0.2500 mg | ORAL_TABLET | Freq: Every day | ORAL | 0 refills | Status: DC

## 2019-11-30 NOTE — Telephone Encounter (Signed)
Received fax from [facility] Pended Rx and sent to Dr. Gupta for approval.  

## 2019-12-27 ENCOUNTER — Non-Acute Institutional Stay: Payer: Medicare Other | Admitting: Internal Medicine

## 2019-12-27 ENCOUNTER — Encounter: Payer: Self-pay | Admitting: Internal Medicine

## 2019-12-27 DIAGNOSIS — G8929 Other chronic pain: Secondary | ICD-10-CM | POA: Diagnosis not present

## 2019-12-27 DIAGNOSIS — M545 Low back pain, unspecified: Secondary | ICD-10-CM

## 2019-12-27 DIAGNOSIS — E039 Hypothyroidism, unspecified: Secondary | ICD-10-CM | POA: Diagnosis not present

## 2019-12-27 DIAGNOSIS — D61818 Other pancytopenia: Secondary | ICD-10-CM | POA: Diagnosis not present

## 2019-12-27 DIAGNOSIS — I10 Essential (primary) hypertension: Secondary | ICD-10-CM | POA: Diagnosis not present

## 2019-12-27 NOTE — Progress Notes (Signed)
Location:    [facility] Room Number: 38 Place of Service:  ALF [phone]) Provider:  Veleta Miners MD   Virgie Dad, MD  Patient Care Team: Virgie Dad, MD as PCP - General (Internal Medicine) Mast, Man X, NP as Nurse Practitioner (Internal Medicine)  Extended Emergency Contact Information Primary Emergency Contact: Jeffords,[name] Address: [address]          [place], [place] [address] [place] Phone: [phone] Mobile Phone: [phone] Relation: Son Secondary Emergency Contact: Tatro,[name]  [place] Phone: [phone] Relation: None  Code Status:  DNR Goals of care: Advanced Directive information Advanced Directives 12/27/2019  Does Patient Have a Medical Advance Directive? Yes  Type of Advance Directive Out of facility DNR (pink MOST or yellow form)  Does patient want to make changes to medical advance directive? No - Patient declined  Copy of [place] in Chart? -  Pre-existing out of facility DNR order (yellow form or pink MOST form) Yellow form placed in chart (order not valid for inpatient use)     Chief Complaint  Patient presents with  . Medical Management of Chronic Issues    HPI:  Pt is a 86 y.o. female seen today for medical management of chronic diseases.   Patient has h/o Chronic Back Pain, Hypertension, Hypothyroidism, Hypertension, Recurrent Cystitis, Osteoporosis and Major Depression And Pancytopenia Resident of [place]  Major depression Continues to be a problem with the patient she was complaining of depression again today. Chronic back pain This continues to be a problem also.  Patient refuses to take anything more stronger.  States she is managing with Tylenol Staying independent with her ADLs.  Has not had any recent falls Pancytopenia Has seen the hematologist no more work-up.  All other problem seems to be stable at this time.  Her weight is stable she is sleeping good  at night.  No nursing issues  Past Medical History:  Diagnosis Date  . Anemia   . Anxiety   . Chronotropic incompetence 12/2012    potentially medication related; noted on CPET   . DDD (degenerative disc disease)   . Eczema   . GERD (gastroesophageal reflux disease)   . H/O hiatal hernia   . Hx: UTI (urinary tract infection)   . Hyperlipidemia   . Hypertension   . Hypothyroidism   . Osteopenia   . Septicemia ([facility]) 2002   following UTI  . Vertigo, benign positional    Past Surgical History:  Procedure Laterality Date  . BREAST SURGERY     left biopsy  . CATARACT EXTRACTION Right    2 weeks ago  . CPET / MET - PFTS     Consistent with chronotropic incompetence; See attached report in the results section  . DILATION AND CURETTAGE OF UTERUS     x 2  . DOPPLER ECHOCARDIOGRAPHY  01/10/2013   Normal LV size and function. Normal EF. Air sclerosis but no stenosis  . ESOPHAGOGASTRODUODENOSCOPY  05/04/2012   Procedure: ESOPHAGOGASTRODUODENOSCOPY (EGD);  Surgeon: Inda Castle, MD;  [facility];  Service: Endoscopy;  Laterality: N/A;  . EYE SURGERY     cataract extraction with ILO  ? eye  . IR KYPHO EA ADDL LEVEL THORACIC OR LUMBAR  10/28/2016  . IR KYPHO LUMBAR INC FX REDUCE BONE BX UNI/BIL CANNULATION INC/IMAGING  10/28/2016  . IR KYPHO THORACIC WITH BONE BIOPSY  12/24/2016  . IR RADIOLOGIST EVAL & MGMT  11/11/2016  . KNEE ARTHROSCOPY  2011   right  . TONSILLECTOMY    . TOTAL KNEE ARTHROPLASTY  04/24/2012   Procedure: TOTAL KNEE ARTHROPLASTY;  Surgeon: Gearlean Alf, MD;  [facility];  Service: Orthopedics;  Laterality: Right;  . TRANSTHORACIC ECHOCARDIOGRAM  02/2018   Ordered by PCP for "congestive heart failure " -EF 60 M 65%.  GR 1 DD.  No R WMA--- > ESSENTIALLY NORMAL    No Known Allergies  Allergies as of 12/27/2019   No Known Allergies     Medication List       Accurate as of December 27, 2019 12:30 PM. If you have any questions, ask your nurse or doctor.         STOP taking these medications   triamcinolone cream 0.5 % Commonly known as: KENALOG Stopped by: Virgie Dad, MD     TAKE these medications   ALPRAZolam 0.25 MG tablet Commonly known as: XANAX Take 1 tablet (0.25 mg total) by mouth at bedtime.   atorvastatin 10 MG tablet Commonly known as: LIPITOR Take 5 mg by mouth daily.   buPROPion 150 MG 12 hr tablet Commonly known as: WELLBUTRIN SR Take 1 tablet (150 mg total) by mouth 2 (two) times daily after a meal.   Camphor-Menthol-Methyl Sal 3.07-31-08 % Ptch Place 1 patch onto the skin. Apply one to lower back daily for back pain   CITRACAL +D3 PO Take 2 capsules by mouth 2 (two) times a day. 656m-25mcg(1000u)   docusate sodium 100 MG capsule Commonly known as: COLACE Take 100 mg by mouth 2 (two) times daily.   ferrous sulfate 325 (65 FE) MG EC tablet Take 325 mg by mouth every Monday, Wednesday, and Friday. With a meal   Fiber Powd Take 15 mLs by mouth daily. Mix with 4-8 oz of water   fish oil-omega-3 fatty acids 1000 MG capsule Take 1 g by mouth daily.   gabapentin 100 MG capsule Commonly known as: NEURONTIN Take 100 mg by mouth 3 (three) times daily.   Klor-Con M20 20 MEQ tablet Generic drug: potassium chloride SA TAKE 1 TABLET BY MOUTH TWICE A DAY   levothyroxine 75 MCG tablet Commonly known as: SYNTHROID Take 1 tablet (75 mcg total) by mouth daily before breakfast.   losartan 25 MG tablet Commonly known as: COZAAR Take 75 mg by mouth daily. What changed: Another medication with the same name was removed. Continue taking this medication, and follow the directions you see here. Changed by: AVirgie Dad MD   methocarbamol 500 MG tablet Commonly known as: ROBAXIN Take 250 mg by mouth daily as needed. For muscle spasms   nitrofurantoin 50 MG capsule Commonly known as: MACRODANTIN Take 1 capsule (50 mg total) by mouth daily.   nystatin powder Commonly known as: MYCOSTATIN/NYSTOP Apply 100,000  g topically 2 (two) times daily as needed.   omeprazole 40 MG capsule Commonly known as: PRILOSEC Take 40 mg by mouth daily.   polyethylene glycol 17 g packet Commonly known as: MIRALAX / GLYCOLAX Take 17 g by mouth as directed. On Monday and Thursday   sertraline 25 MG tablet Commonly known as: ZOLOFT Take 25 mg by mouth daily.   torsemide 20 MG tablet Commonly known as: DEMADEX Take 20 mg by mouth daily.   traMADol 50 MG tablet Commonly known as: ULTRAM Take 50 mg by mouth every 6 (six) hours as needed.   TYLENOL 500 MG tablet Generic drug: acetaminophen Take 1,000 mg by  mouth 3 (three) times daily as needed.       Review of Systems  Constitutional: Positive for activity change.  HENT: Negative.   Respiratory: Negative.   Cardiovascular: Positive for leg swelling.  Gastrointestinal: Positive for constipation.  Genitourinary: Negative.   Musculoskeletal: Positive for back pain and gait problem.  Neurological: Positive for weakness.  Psychiatric/Behavioral: Positive for dysphoric mood and sleep disturbance. The patient is nervous/anxious.     Immunization History  Administered Date(s) Administered  . Influenza, High Dose Seasonal PF 04/26/2016, 03/22/2017, 05/08/2019  . Influenza,inj,Quad PF,6+ Mos 04/27/2018  . Influenza-Unspecified 04/22/2011, 04/04/2012, 05/03/2013, 05/04/2013, 04/17/2014, 04/19/2015, 04/26/2016, 04/22/2017  . Moderna SARS-COVID-2 Vaccination 07/30/2019, 08/27/2019  . Pneumococcal Polysaccharide-23 08/26/2003, 09/06/2011, 05/15/2019  . Pneumococcal-Unspecified 12/20/2013  . Td 09/26/2006  . Tdap 05/15/2019  . Zoster 10/25/2007  . Zoster Recombinat (Shingrix) 11/21/2017, 04/08/2018   Pertinent  Health Maintenance Due  Topic Date Due  . INFLUENZA VACCINE  02/24/2020  . PNA vac Low Risk Adult (2 of 2 - PCV13) 05/14/2020  . DEXA SCAN  Completed   Fall Risk  10/18/2018 08/23/2018 05/09/2018 05/01/2018 04/10/2018  Falls in the past year? 0 0 Yes  Yes No  Comment - - - fell in June/2019 _0  Home (asst living) -  Number falls in past yr: 0 0 1 1 -  Injury with Fall? 0 0 No No -  Risk for fall due to : - - - Impaired balance/gait -  Follow up - - - Falls prevention discussed -   Functional Status Survey:    Vitals:   12/27/19 1216  BP: 128/60  Pulse: 61  Resp: 20  Temp: (!) 97.5 F (36.4 C)  SpO2: 96%  Weight: 179 lb 3.2 oz (81.3 kg)  Height: 5' 4" (1.626 m)   Body mass index is 30.76 kg/m. Physical Exam Vitals reviewed.  Constitutional:      Appearance: Normal appearance.  HENT:     Head: Normocephalic.     Nose: Nose normal.     Mouth/Throat:     Mouth: Mucous membranes are moist.     Pharynx: Oropharynx is clear.  Eyes:     Pupils: Pupils are equal, round, and reactive to light.  Cardiovascular:     Rate and Rhythm: Normal rate and regular rhythm.     Pulses: Normal pulses.  Pulmonary:     Effort: Pulmonary effort is normal.     Breath sounds: Normal breath sounds.  Abdominal:     General: Abdomen is flat. Bowel sounds are normal.     Palpations: Abdomen is soft.  Musculoskeletal:        General: Swelling present.     Cervical back: Neck supple.  Skin:    General: Skin is warm.  Neurological:     General: No focal deficit present.     Mental Status: She is alert and oriented to person, place, and time.  Psychiatric:        Mood and Affect: Mood normal.        Thought Content: Thought content normal.     Labs reviewed: Recent Labs    01/04/19 0000 08/07/19 0000 09/17/19 0000  NA 138 143 140  K 3.9 4.3 4.2  CL  --  108 105  CO2  --  27* 28*  BUN 28* 31* 29*  CREATININE 1.2* 1.3* 1.4*  CALCIUM  --   --  9.8   Recent Labs    01/04/19 0000 08/07/19 0000 09/17/19 0000  AST  21 24 12*  ALT 17 17 4*  ALKPHOS  --  102 104  ALBUMIN  --   --  4.0   Recent Labs    09/17/19 0000 10/01/19 0000 11/26/19 0000  WBC 2.8 2.7 4.1  NEUTROABS 1,406 1,204  --   HGB 8.2* 8.2* 8.7*  HCT 27*  27* 28*  PLT 72* 76* 73*   Lab Results  Component Value Date   TSH 3.63 09/17/2019   No results found for: HGBA1C Lab Results  Component Value Date   CHOL 227 (H) 11/23/2018   HDL 60 11/23/2018   LDLCALC 141 (H) 11/23/2018   TRIG 140 11/23/2018   CHOLHDL 3.8 11/23/2018    Significant Diagnostic Results in last 30 days:  No results found.  Assessment/Plan  Essential hypertension Change Cozaar to 50 mg  Acquired hypothyroidism TSH Normal in 2/21  Pancytopenia ([place]) Stable No More Work up per hematologist  Depression Change Zoloft to 64m ON wellbutrin Psych consult On Xanax at night  Chronic back pain, Doing well Tramadol and Robaxin and Neurontin  Stage 3 chronic kidney disease Creat Stable  Mixed hyperlipidemia On Lipitor LDL was 83 on Labs done in 10/20   LE edema Doing good on Demadex Recurrent Cystitis Continue On Nitrofurantoin  Family/ staff Communication:   Labs/tests ordered:

## 2020-01-02 ENCOUNTER — Other Ambulatory Visit: Payer: Self-pay

## 2020-01-02 MED ORDER — ALPRAZOLAM 0.25 MG PO TABS: 0.2500 mg | ORAL_TABLET | Freq: Every day | ORAL | 0 refills | Status: DC

## 2020-01-02 NOTE — Telephone Encounter (Signed)
Refill request received from pharmacy for Xanax(alprazolam) 0.25 mg once daily

## 2020-02-11 ENCOUNTER — Non-Acute Institutional Stay: Payer: Medicare Other | Admitting: Nurse Practitioner

## 2020-02-11 ENCOUNTER — Encounter: Payer: Self-pay | Admitting: Nurse Practitioner

## 2020-02-11 DIAGNOSIS — K5909 Other constipation: Secondary | ICD-10-CM | POA: Diagnosis not present

## 2020-02-11 DIAGNOSIS — N3090 Cystitis, unspecified without hematuria: Secondary | ICD-10-CM

## 2020-02-11 DIAGNOSIS — E039 Hypothyroidism, unspecified: Secondary | ICD-10-CM

## 2020-02-11 DIAGNOSIS — L293 Anogenital pruritus, unspecified: Secondary | ICD-10-CM | POA: Diagnosis not present

## 2020-02-11 DIAGNOSIS — S32020A Wedge compression fracture of second lumbar vertebra, initial encounter for closed fracture: Secondary | ICD-10-CM

## 2020-02-11 DIAGNOSIS — I10 Essential (primary) hypertension: Secondary | ICD-10-CM | POA: Diagnosis not present

## 2020-02-11 DIAGNOSIS — D638 Anemia in other chronic diseases classified elsewhere: Secondary | ICD-10-CM

## 2020-02-11 DIAGNOSIS — K219 Gastro-esophageal reflux disease without esophagitis: Secondary | ICD-10-CM

## 2020-02-11 DIAGNOSIS — I739 Peripheral vascular disease, unspecified: Secondary | ICD-10-CM

## 2020-02-11 DIAGNOSIS — F418 Other specified anxiety disorders: Secondary | ICD-10-CM

## 2020-02-11 NOTE — Assessment & Plan Note (Signed)
Blood pressure is controlled, continue Losartan 

## 2020-02-11 NOTE — Assessment & Plan Note (Signed)
Baseline Hgb 8s, continue Fe 

## 2020-02-11 NOTE — Assessment & Plan Note (Signed)
Minimal edema BLE, continue Torsemide.  

## 2020-02-11 NOTE — Assessment & Plan Note (Signed)
Chronic lower back pain, continue Gabapentin, Methocarbamol

## 2020-02-11 NOTE — Assessment & Plan Note (Signed)
TSH 3.62 09/17/19, continue Levothyroxine.

## 2020-02-11 NOTE — Assessment & Plan Note (Signed)
Stable, continue Omeprazole.  

## 2020-02-11 NOTE — Assessment & Plan Note (Signed)
Apply Nystatin cream, 0.1% Hydrocortisone cream daily x 10 days, then prn.

## 2020-02-11 NOTE — Assessment & Plan Note (Signed)
Stable, continue Fiber, Colace, MiraLax.

## 2020-02-11 NOTE — Assessment & Plan Note (Signed)
Her mood is stable, continue Wellbutrin, Sertraline, Alprazolam.

## 2020-02-11 NOTE — Assessment & Plan Note (Signed)
Continue Nitrofurantoin daily for suppression tx.

## 2020-02-11 NOTE — Progress Notes (Signed)
Location:   [facility] Room Number: 61 Place of Service:  ALF (13) Provider: Lennie Odor Akyra Bouchie NP  Virgie Dad, MD  Patient Care Team: Virgie Dad, MD as PCP - General (Internal Medicine) Manjot Beumer X, NP as Nurse Practitioner (Internal Medicine)  Extended Emergency Contact Information Primary Emergency Contact: Canepa,[name] Address: [address]          [place], [place] [address] [place] Phone: [phone] Mobile Phone: [phone] Relation: Son Secondary Emergency Contact: Bert,[name]  [place] Phone: [phone] Relation: None  Code Status:  DNR Goals of care: Advanced Directive information Advanced Directives 12/27/2019  Does Patient Have a Medical Advance Directive? Yes  Type of Advance Directive Out of facility DNR (pink MOST or yellow form)  Does patient want to make changes to medical advance directive? No - Patient declined  Copy of [place] in Chart? -  Pre-existing out of facility DNR order (yellow form or pink MOST form) Yellow form placed in chart (order not valid for inpatient use)     Chief Complaint  Patient presents with  . Acute Visit    itching urogenital area    HPI:  Pt is a 86 y.o. female seen today for an acute visit for on and off urogenital area, no apparent rash, the patient is overweight, skin folds are tight, sometime she has leakage urine, not always clean self well after BM.   Hx of recurrent UTI, currently on Nitrofurantoin 85m qd for UTI suppression therapy.   HTN, blood pressure is controlled on Losartan 563mqd.  Hemorrhoids, no injury.   No constipation while taking prn MiraLax, Fiber daily, Colace bid.   Anxiety, takes Alprazolam qhs, Wellbutrin 15037mid, Sertraline 57m51m.   Anemia, takes Fe 3x/wk  Lower back pain, takes Gabapentin 100mg4m, prn Methocarbamol.   Hypothyroidism, takes Levothyroxine 75mcg8m TSH wnl 08/2019  GERD, takes Omeprazole 40mg q16mdema  BLE, minimal, takes Torsemide 20mg qd53mPast Medical History:  Diagnosis Date  . Anemia   . Anxiety   . Chronotropic incompetence 12/2012    potentially medication related; noted on CPET   . DDD (degenerative disc disease)   . Eczema   . GERD (gastroesophageal reflux disease)   . H/O hiatal hernia   . Hx: UTI (urinary tract infection)   . Hyperlipidemia   . Hypertension   . Hypothyroidism   . Osteopenia   . Septicemia (HCC) [address] following UTI  . Vertigo, benign positional    Past Surgical History:  Procedure Laterality Date  . BREAST SURGERY     left biopsy  . CATARACT EXTRACTION Right    2 weeks ago  . CPET / MET - PFTS     Consistent with chronotropic incompetence; See attached report in the results section  . DILATION AND CURETTAGE OF UTERUS     x 2  . DOPPLER ECHOCARDIOGRAPHY  01/10/2013   Normal LV size and function. Normal EF. Air sclerosis but no stenosis  . ESOPHAGOGASTRODUODENOSCOPY  05/04/2012   Procedure: ESOPHAGOGASTRODUODENOSCOPY (EGD);  Surgeon: Robert DInda Castleocation: [facility];  Service: Endoscopy;  Laterality: N/A;  . EYE SURGERY     cataract extraction with ILO  ? eye  . IR KYPHO EA ADDL LEVEL THORACIC OR LUMBAR  10/28/2016  . IR KYPHO LUMBAR INC FX REDUCE BONE BX UNI/BIL CANNULATION INC/IMAGING  10/28/2016  . IR KYPHO THORACIC WITH BONE  BIOPSY  12/24/2016  . IR RADIOLOGIST EVAL & MGMT  11/11/2016  . KNEE ARTHROSCOPY  2011   right  . TONSILLECTOMY    . TOTAL KNEE ARTHROPLASTY  04/24/2012   Procedure: TOTAL KNEE ARTHROPLASTY;  Surgeon: Gearlean Alf, MD;  Location: [facility];  Service: Orthopedics;  Laterality: Right;  . TRANSTHORACIC ECHOCARDIOGRAM  02/2018   Ordered by PCP for "congestive heart failure " -EF 60 M 65%.  GR 1 DD.  No R WMA--- > ESSENTIALLY NORMAL    No Known Allergies  Allergies as of 02/11/2020   No Known Allergies     Medication List       Accurate as of February 11, 2020 11:59 PM. If you have any questions, ask your nurse  or doctor.        ALPRAZolam 0.25 MG tablet Commonly known as: XANAX Take 1 tablet (0.25 mg total) by mouth at bedtime.   atorvastatin 10 MG tablet Commonly known as: LIPITOR Take 5 mg by mouth daily.   buPROPion 150 MG 12 hr tablet Commonly known as: WELLBUTRIN SR Take 1 tablet (150 mg total) by mouth 2 (two) times daily after a meal.   Camphor-Menthol-Methyl Sal 3.07-31-08 % Ptch Place 1 patch onto the skin. Apply one to lower back daily for back pain   CITRACAL +D3 PO Take 2 capsules by mouth 2 (two) times a day. 635m-25mcg(1000u)   docusate sodium 100 MG capsule Commonly known as: COLACE Take 100 mg by mouth 2 (two) times daily.   ferrous sulfate 325 (65 FE) MG EC tablet Take 325 mg by mouth every Monday, Wednesday, and Friday. With a meal   Fiber Powd Take 15 mLs by mouth daily. Mix with 4-8 oz of water   fish oil-omega-3 fatty acids 1000 MG capsule Take 1 g by mouth daily.   gabapentin 100 MG capsule Commonly known as: NEURONTIN Take 100 mg by mouth 3 (three) times daily.   Klor-Con M20 20 MEQ tablet Generic drug: potassium chloride SA TAKE 1 TABLET BY MOUTH TWICE A DAY   levothyroxine 75 MCG tablet Commonly known as: SYNTHROID Take 1 tablet (75 mcg total) by mouth daily before breakfast.   losartan 50 MG tablet Commonly known as: COZAAR Take 50 mg by mouth daily.   methocarbamol 500 MG tablet Commonly known as: ROBAXIN Take 250 mg by mouth daily as needed. For muscle spasms   nitrofurantoin 50 MG capsule Commonly known as: MACRODANTIN Take 1 capsule (50 mg total) by mouth daily.   nystatin powder Commonly known as: MYCOSTATIN/NYSTOP Apply 100,000 g topically 2 (two) times daily as needed.   omeprazole 40 MG capsule Commonly known as: PRILOSEC Take 40 mg by mouth daily.   polyethylene glycol 17 g packet Commonly known as: MIRALAX / GLYCOLAX Take 17 g by mouth as directed. On Monday and Thursday   sertraline 50 MG tablet Commonly known as:  ZOLOFT Take 50 mg by mouth daily.   torsemide 20 MG tablet Commonly known as: DEMADEX Take 20 mg by mouth daily.   traMADol 50 MG tablet Commonly known as: ULTRAM Take 50 mg by mouth every 6 (six) hours as needed.   TYLENOL 500 MG tablet Generic drug: acetaminophen Take 1,000 mg by mouth 3 (three) times daily as needed.       Review of Systems  Constitutional: Negative for appetite change, fatigue and fever.       Chronic fatigue.   HENT: Positive for hearing loss. Negative for congestion and voice  change.   Eyes: Negative for visual disturbance.  Respiratory: Positive for shortness of breath. Negative for cough and wheezing.        DOE  Cardiovascular: Positive for leg swelling. Negative for chest pain and palpitations.  Gastrointestinal: Negative for abdominal pain and constipation.  Genitourinary: Negative for difficulty urinating, dysuria and urgency.       Urinary leakage.   Musculoskeletal: Positive for arthralgias, back pain and gait problem.  Skin: Negative for color change and rash.       Perineal itching.   Neurological: Negative for dizziness, tremors and speech difficulty.       Hx of vertigo, chronic  Psychiatric/Behavioral: Negative for behavioral problems and sleep disturbance. The patient is not nervous/anxious.     Immunization History  Administered Date(s) Administered  . Influenza, High Dose Seasonal PF 04/26/2016, 03/22/2017, 05/08/2019  . Influenza,inj,Quad PF,6+ Mos 04/27/2018  . Influenza-Unspecified 04/22/2011, 04/04/2012, 05/03/2013, 05/04/2013, 04/17/2014, 04/19/2015, 04/26/2016, 04/22/2017  . Moderna SARS-COVID-2 Vaccination 07/30/2019, 08/27/2019  . Pneumococcal Polysaccharide-23 08/26/2003, 09/06/2011, 05/15/2019  . Pneumococcal-Unspecified 12/20/2013  . Td 09/26/2006  . Tdap 05/15/2019  . Zoster 10/25/2007  . Zoster Recombinat (Shingrix) 11/21/2017, 04/08/2018   Pertinent  Health Maintenance Due  Topic Date Due  . INFLUENZA VACCINE   02/24/2020  . PNA vac Low Risk Adult (2 of 2 - PCV13) 05/14/2020  . DEXA SCAN  Completed   Fall Risk  10/18/2018 08/23/2018 05/09/2018 05/01/2018 04/10/2018  Falls in the past year? 0 0 Yes Yes No  Comment - - - [facility] -  Number falls in past yr: 0 0 1 1 -  Injury with Fall? 0 0 No No -  Risk for fall due to : - - - Impaired balance/gait -  Follow up - - - Falls prevention discussed -   Functional Status Survey:    Vitals:   02/11/20 1528  BP: 131/67  Pulse: 64  Resp: 20  Temp: (!) 97.5 F (36.4 C)  SpO2: 95%  Weight: 178 lb 6.4 oz (80.9 kg)  Height: '5\' 4"'  (1.626 m)   Body mass index is 30.62 kg/m. Physical Exam Vitals and nursing note reviewed.  Constitutional:      Appearance: Normal appearance.  HENT:     Head: Normocephalic and atraumatic.  Eyes:     Extraocular Movements: Extraocular movements intact.     Conjunctiva/sclera: Conjunctivae normal.     Pupils: Pupils are equal, round, and reactive to light.  Cardiovascular:     Rate and Rhythm: Normal rate and regular rhythm.     Heart sounds: No murmur heard.   Pulmonary:     Breath sounds: No wheezing.  Abdominal:     General: Bowel sounds are normal.     Palpations: Abdomen is soft.     Tenderness: There is no abdominal tenderness. There is no rebound.  Genitourinary:    Vagina: No vaginal discharge.     Comments: External hemorrhoid, no injury or bleeding, no apparent rash in urogenital area.  Musculoskeletal:     Cervical back: Normal range of motion and neck supple.     Right lower leg: Edema present.     Left lower leg: Edema present.     Comments: Edema trace BLE  Skin:    General: Skin is warm and dry.     Findings: No rash.     Comments: Urogenital/perineal area redness, irritation.   Neurological:     General: No focal deficit present.  Mental Status: She is alert. Mental status is at baseline.     Motor: No weakness.     Coordination: Coordination  abnormal.     Gait: Gait abnormal.     Comments: Oriented to person, place.   Psychiatric:        Mood and Affect: Mood normal.        Behavior: Behavior normal.        Thought Content: Thought content normal.     Labs reviewed: Recent Labs    08/07/19 0000 09/17/19 0000  NA 143 140  K 4.3 4.2  CL 108 105  CO2 27* 28*  BUN 31* 29*  CREATININE 1.3* 1.4*  CALCIUM  --  9.8   Recent Labs    08/07/19 0000 09/17/19 0000  AST 24 12*  ALT 17 4*  ALKPHOS 102 104  ALBUMIN  --  4.0   Recent Labs    09/17/19 0000 10/01/19 0000 11/26/19 0000  WBC 2.8 2.7 4.1  NEUTROABS 1,406 1,204  --   HGB 8.2* 8.2* 8.7*  HCT 27* 27* 28*  PLT 72* 76* 73*   Lab Results  Component Value Date   TSH 3.63 09/17/2019   No results found for: HGBA1C Lab Results  Component Value Date   CHOL 227 (H) 11/23/2018   HDL 60 11/23/2018   LDLCALC 141 (H) 11/23/2018   TRIG 140 11/23/2018   CHOLHDL 3.8 11/23/2018    Significant Diagnostic Results in last 30 days:  No results found.  Assessment/Plan Perineal itching, female Apply Nystatin cream, 0.1% Hydrocortisone cream daily x 10 days, then prn.   Essential hypertension Blood pressure is controlled, continue Losartan.   PVD (peripheral vascular disease) (HCC) Minimal edema BLE, continue Torsemide.   Chronic constipation Stable, continue Fiber, Colace, MiraLax.   GERD (gastroesophageal reflux disease) Stable, continue Omeprazole.   Acquired hypothyroidism TSH 3.62 09/17/19, continue Levothyroxine.   Compression fracture of L2 lumbar vertebra, closed, initial encounter (HCC) Chronic lower back pain, continue Gabapentin, Methocarbamol  Anemia of chronic disease Baseline Hgb 8s, continue Fe  Depression with anxiety Her mood is stable, continue Wellbutrin, Sertraline, Alprazolam.   Recurrent cystitis Continue Nitrofurantoin daily for suppression tx.     Family/ staff Communication: plan of care reviewed with the patient and  charge nurse.   Labs/tests ordered:  None  Time spend 35 minutes.

## 2020-02-12 ENCOUNTER — Encounter: Payer: Self-pay | Admitting: Nurse Practitioner

## 2020-03-14 ENCOUNTER — Other Ambulatory Visit: Payer: Self-pay | Admitting: *Deleted

## 2020-03-14 MED ORDER — ALPRAZOLAM 0.25 MG PO TABS
0.2500 mg | ORAL_TABLET | Freq: Every day | ORAL | 0 refills | Status: DC
Start: 2020-03-14 — End: 2020-04-01

## 2020-03-14 NOTE — Telephone Encounter (Signed)
Received fax from [facility]. Pended Rx and sent to Dr. Gupta for approval.  

## 2020-04-01 ENCOUNTER — Other Ambulatory Visit: Payer: Self-pay

## 2020-04-01 MED ORDER — ALPRAZOLAM 0.25 MG PO TABS
0.2500 mg | ORAL_TABLET | Freq: Every day | ORAL | 0 refills | Status: DC
Start: 2020-04-01 — End: 2021-03-31

## 2020-04-08 ENCOUNTER — Non-Acute Institutional Stay: Payer: Medicare Other | Admitting: Internal Medicine

## 2020-04-08 ENCOUNTER — Encounter: Payer: Self-pay | Admitting: Internal Medicine

## 2020-04-08 DIAGNOSIS — I10 Essential (primary) hypertension: Secondary | ICD-10-CM | POA: Diagnosis not present

## 2020-04-08 DIAGNOSIS — D61818 Other pancytopenia: Secondary | ICD-10-CM

## 2020-04-08 DIAGNOSIS — E782 Mixed hyperlipidemia: Secondary | ICD-10-CM

## 2020-04-08 DIAGNOSIS — F418 Other specified anxiety disorders: Secondary | ICD-10-CM

## 2020-04-08 DIAGNOSIS — N183 Chronic kidney disease, stage 3 unspecified: Secondary | ICD-10-CM

## 2020-04-08 DIAGNOSIS — R42 Dizziness and giddiness: Secondary | ICD-10-CM | POA: Diagnosis not present

## 2020-04-08 NOTE — Progress Notes (Signed)
Location:    [facility] Room Number: 70 Place of Service:  ALF [phone]) Provider:  Veleta Miners MD  Virgie Dad, MD  Patient Care Team: Virgie Dad, MD as PCP - General (Internal Medicine) Mast, Man X, NP as Nurse Practitioner (Internal Medicine)  Extended Emergency Contact Information Primary Emergency Contact: Fricker,[name] Address: [address]          [place], [place] [address] [place] Phone: [phone] Mobile Phone: [phone] Relation: Son Secondary Emergency Contact: Parran,[name]  [place] Phone: [phone] Relation: None  Code Status:  DNR Goals of care: Advanced Directive information Advanced Directives 04/08/2020  Does Patient Have a Medical Advance Directive? Yes  Type of Advance Directive Out of facility DNR (pink MOST or yellow form)  Does patient want to make changes to medical advance directive? No - Patient declined  Copy of [place] in Chart? -  Pre-existing out of facility DNR order (yellow form or pink MOST form) Yellow form placed in chart (order not valid for inpatient use)     Chief Complaint  Patient presents with  . Medical Management of Chronic Issues    HPI:  Pt is a 86 y.o. female seen today for medical management of chronic diseases.   Patient has h/o Chronic Back Pain, Hypertension, Hypothyroidism, Hypertension, Recurrent Cystitis, Osteoporosis and Major DepressionAnd Pancytopenia  [facility]ident of [facility] Doing well Weight is stable Continues to struggle with Low Back Pain Was also c/o Vertigo. Says it is her Chronic Problem. Mood Better But feels tired and SOB on Exertion Walking with Walker. Staying independent with her ADLS  Past Medical History:  Diagnosis Date  . Anemia   . Anxiety   . Chronotropic incompetence 12/2012    potentially medication related; noted on CPET   . DDD (degenerative disc disease)   . Eczema   . GERD (gastroesophageal reflux  disease)   . H/O hiatal hernia   . Hx: UTI (urinary tract infection)   . Hyperlipidemia   . Hypertension   . Hypothyroidism   . Osteopenia   . Septicemia ([place]) 2002   following UTI  . Vertigo, benign positional    Past Surgical History:  Procedure Laterality Date  . BREAST SURGERY     left biopsy  . CATARACT EXTRACTION Right    2 weeks ago  . CPET / MET - PFTS     Consistent with chronotropic incompetence; See attached report in the results section  . DILATION AND CURETTAGE OF UTERUS     x 2  . DOPPLER ECHOCARDIOGRAPHY  01/10/2013   Normal LV size and function. Normal EF. Air sclerosis but no stenosis  . ESOPHAGOGASTRODUODENOSCOPY  05/04/2012   Procedure: ESOPHAGOGASTRODUODENOSCOPY (EGD);  Surgeon: Inda Castle, MD;  [facility];  Service: Endoscopy;  Laterality: N/A;  . EYE SURGERY     cataract extraction with ILO  ? eye  . IR KYPHO EA ADDL LEVEL THORACIC OR LUMBAR  10/28/2016  . IR KYPHO LUMBAR INC FX REDUCE BONE BX UNI/BIL CANNULATION INC/IMAGING  10/28/2016  . IR KYPHO THORACIC WITH BONE BIOPSY  12/24/2016  . IR RADIOLOGIST EVAL & MGMT  11/11/2016  . KNEE ARTHROSCOPY  2011   right  . TONSILLECTOMY    . TOTAL KNEE ARTHROPLASTY  04/24/2012   Procedure: TOTAL KNEE ARTHROPLASTY;  Surgeon: Gearlean Alf, MD;  [facility]: [facility];  Service: Orthopedics;  Laterality: Right;  . TRANSTHORACIC ECHOCARDIOGRAM  02/2018  Ordered by PCP for "congestive heart failure " -EF 60 M 65%.  GR 1 DD.  No R WMA--- > ESSENTIALLY NORMAL    No Known Allergies  Allergies as of 04/08/2020   No Known Allergies     Medication List       Accurate as of April 08, 2020 10:23 AM. If you have any questions, ask your nurse or doctor.        ALPRAZolam 0.25 MG tablet Commonly known as: XANAX Take 1 tablet (0.25 mg total) by mouth at bedtime.   atorvastatin 10 MG tablet Commonly known as: LIPITOR Take 5 mg by mouth daily.   buPROPion 150 MG 12 hr tablet Commonly known as:  WELLBUTRIN SR Take 1 tablet (150 mg total) by mouth 2 (two) times daily after a meal.   Camphor-Menthol-Methyl Sal 3.07-31-08 % Ptch Place 1 patch onto the skin. Apply one to lower back daily for back pain   CITRACAL +D3 PO Take 2 capsules by mouth 2 (two) times a day. 628m-25mcg(1000u)   docusate sodium 100 MG capsule Commonly known as: COLACE Take 100 mg by mouth 2 (two) times daily.   ferrous sulfate 325 (65 FE) MG EC tablet Take 325 mg by mouth every Monday, Wednesday, and Friday. With a meal   Fiber Powd Take 15 mLs by mouth daily. Mix with 4-8 oz of water   fish oil-omega-3 fatty acids 1000 MG capsule Take 1 g by mouth daily.   gabapentin 100 MG capsule Commonly known as: NEURONTIN Take 100 mg by mouth 3 (three) times daily.   hydrocortisone cream 1 % Apply 1 application topically daily as needed for itching. PRN to urogenital itchy area X 10 days than PRN   Klor-Con M20 20 MEQ tablet Generic drug: potassium chloride SA TAKE 1 TABLET BY MOUTH TWICE A DAY   levothyroxine 75 MCG tablet Commonly known as: SYNTHROID Take 1 tablet (75 mcg total) by mouth daily before breakfast.   losartan 50 MG tablet Commonly known as: COZAAR Take 50 mg by mouth daily.   methocarbamol 500 MG tablet Commonly known as: ROBAXIN Take 250 mg by mouth daily as needed. For muscle spasms   nitrofurantoin 50 MG capsule Commonly known as: MACRODANTIN Take 1 capsule (50 mg total) by mouth daily.   nystatin powder Commonly known as: MYCOSTATIN/NYSTOP Apply 100,000 g topically 2 (two) times daily as needed.   omeprazole 40 MG capsule Commonly known as: PRILOSEC Take 40 mg by mouth daily.   polyethylene glycol 17 g packet Commonly known as: MIRALAX / GLYCOLAX Take 17 g by mouth as directed. On Monday and Thursday   sertraline 50 MG tablet Commonly known as: ZOLOFT Take 50 mg by mouth daily.   torsemide 20 MG tablet Commonly known as: DEMADEX Take 20 mg by mouth daily.     traMADol 50 MG tablet Commonly known as: ULTRAM Take 50 mg by mouth every 6 (six) hours as needed.   TYLENOL 500 MG tablet Generic drug: acetaminophen Take 1,000 mg by mouth 3 (three) times daily as needed.       Review of Systems  Constitutional: Negative.   HENT: Negative.   Respiratory: Positive for shortness of breath.   Cardiovascular: Negative.   Gastrointestinal: Negative.   Genitourinary: Negative.   Musculoskeletal: Positive for arthralgias, back pain, gait problem and myalgias.  Skin: Negative.   Neurological: Positive for dizziness and weakness.  Psychiatric/Behavioral: Negative.     Immunization History  Administered Date(s) Administered  . Influenza,  High Dose Seasonal PF 04/26/2016, 03/22/2017, 05/08/2019  . Influenza,inj,Quad PF,6+ Mos 04/27/2018  . Influenza-Unspecified 04/22/2011, 04/04/2012, 05/03/2013, 05/04/2013, 04/17/2014, 04/19/2015, 04/26/2016, 04/22/2017  . Moderna SARS-COVID-2 Vaccination 07/30/2019, 08/27/2019  . Pneumococcal Polysaccharide-23 08/26/2003, 09/06/2011, 05/15/2019  . Pneumococcal-Unspecified 12/20/2013  . Td 09/26/2006  . Tdap 05/15/2019  . Zoster 10/25/2007  . Zoster Recombinat (Shingrix) 11/21/2017, 04/08/2018   Pertinent  Health Maintenance Due  Topic Date Due  . INFLUENZA VACCINE  02/24/2020  . PNA vac Low Risk Adult (2 of 2 - PCV13) 05/14/2020  . DEXA SCAN  Completed   Fall Risk  10/18/2018 08/23/2018 05/09/2018 05/01/2018 04/10/2018  Falls in the past year? 0 0 Yes Yes No  Comment - - - [facility] -  Number falls in past yr: 0 0 1 1 -  Injury with Fall? 0 0 No No -  Risk for fall due to : - - - Impaired balance/gait -  Follow up - - - Falls prevention discussed -   Functional Status Survey:    Vitals:   04/08/20 1016  BP: (!) 147/68  Pulse: 68  Resp: 20  Temp: (!) 97 F (36.1 C)  SpO2: 96%  Weight: 178 lb (80.7 kg)  Height: '5\' 4"'  (1.626 m)   Body mass index is 30.55  kg/m. Physical Exam  Constitutional: Oriented to person, place, and time. Well-developed and well-nourished.  HENT:  Head: Normocephalic.  Mouth/Throat: Oropharynx is clear and moist.  Eyes: Pupils are equal, round, and reactive to light.  Neck: Neck supple.  Cardiovascular: Normal rate and normal heart sounds.  No murmur heard. Pulmonary/Chest: Effort normal and breath sounds normal. No respiratory distress. No wheezes. She has no rales.  Abdominal: Soft. Bowel sounds are normal. No distension. There is no tenderness. There is no rebound.  Musculoskeletal: No edema.  Lymphadenopathy: none Neurological: Alert and oriented to person, place, and time.  Skin: Skin is warm and dry.  Psychiatric: Normal mood and affect. Behavior is normal. Thought content normal.    Labs reviewed: Recent Labs    08/07/19 0000 09/17/19 0000  NA 143 140  K 4.3 4.2  CL 108 105  CO2 27* 28*  BUN 31* 29*  CREATININE 1.3* 1.4*  CALCIUM  --  9.8   Recent Labs    08/07/19 0000 09/17/19 0000  AST 24 12*  ALT 17 4*  ALKPHOS 102 104  ALBUMIN  --  4.0   Recent Labs    09/17/19 0000 10/01/19 0000 11/26/19 0000  WBC 2.8 2.7 4.1  NEUTROABS 1,406 1,204  --   HGB 8.2* 8.2* 8.7*  HCT 27* 27* 28*  PLT 72* 76* 73*   Lab Results  Component Value Date   TSH 3.63 09/17/2019   No results found for: HGBA1C Lab Results  Component Value Date   CHOL 227 (H) 11/23/2018   HDL 60 11/23/2018   LDLCALC 141 (H) 11/23/2018   TRIG 140 11/23/2018   CHOLHDL 3.8 11/23/2018    Significant Diagnostic Results in last 30 days:  No results found.  Assessment/Plan Vertigo Meclizine 12.5 mg QHS for 2 weeks and then reval Essential hypertension On Cozaar Mixed hyperlipidemia Repeat Lipid panel On Lipitor Pancytopenia ([place]) Repeat CBC No More Work up per hematologist On Low Dose of Iron  Depression with anxiety On Zoloft and Wellbutrin and Low Dose of Xanax  Stage 3 chronic kidney disease,  Repeat  CMP Chronic Back Pain On Robaxin,Tramdol an dNeurontin Chronic Cystitis On Nitrofurantoin  GERD On Omeprzaole Acquired hypothyroidism TSH Normal in 2/21 Family/ staff Communication:   Labs/tests ordered:  CBC,CMP,Lipid panel

## 2020-04-10 DIAGNOSIS — E785 Hyperlipidemia, unspecified: Secondary | ICD-10-CM | POA: Diagnosis not present

## 2020-04-10 DIAGNOSIS — E039 Hypothyroidism, unspecified: Secondary | ICD-10-CM | POA: Diagnosis not present

## 2020-04-10 DIAGNOSIS — I10 Essential (primary) hypertension: Secondary | ICD-10-CM | POA: Diagnosis not present

## 2020-04-10 DIAGNOSIS — I119 Hypertensive heart disease without heart failure: Secondary | ICD-10-CM | POA: Diagnosis not present

## 2020-04-10 LAB — BASIC METABOLIC PANEL
BUN: 31 — AB (ref 4–21)
CO2: 30 — AB (ref 13–22)
Chloride: 104 (ref 99–108)
Creatinine: 1.3 — AB (ref 0.5–1.1)
Glucose: 90
Potassium: 4.1 (ref 3.4–5.3)
Sodium: 141 (ref 137–147)

## 2020-04-10 LAB — LIPID PANEL
Cholesterol: 154 (ref 0–200)
HDL: 47 (ref 35–70)
LDL Cholesterol: 87
LDl/HDL Ratio: 3.3
Triglycerides: 108 (ref 40–160)

## 2020-04-10 LAB — CBC AND DIFFERENTIAL
HCT: 32 — AB (ref 36–46)
Hemoglobin: 10 — AB (ref 12.0–16.0)
Neutrophils Absolute: 1510
Platelets: 92 — AB (ref 150–399)
WBC: 3.4

## 2020-04-10 LAB — HEPATIC FUNCTION PANEL
ALT: 8 (ref 7–35)
AST: 14 (ref 13–35)
Alkaline Phosphatase: 85 (ref 25–125)
Bilirubin, Total: 0.3

## 2020-04-10 LAB — CBC: RBC: 3.7 — AB (ref 3.87–5.11)

## 2020-04-10 LAB — COMPREHENSIVE METABOLIC PANEL
Albumin: 4 (ref 3.5–5.0)
Calcium: 9.7 (ref 8.7–10.7)
Globulin: 3

## 2020-05-12 ENCOUNTER — Other Ambulatory Visit: Payer: Self-pay

## 2020-05-12 MED ORDER — TRAMADOL HCL 50 MG PO TABS
50.0000 mg | ORAL_TABLET | Freq: Four times a day (QID) | ORAL | 0 refills | Status: DC | PRN
Start: 2020-05-12 — End: 2020-07-28

## 2020-05-12 NOTE — Telephone Encounter (Signed)
Fax received from [facility] for Tramadol 50 mg tablet one every 6 hours as needed for pain. Medication pended and sent to Dr. [name] for approval.

## 2020-06-03 ENCOUNTER — Encounter: Payer: Self-pay | Admitting: Nurse Practitioner

## 2020-06-03 ENCOUNTER — Non-Acute Institutional Stay: Payer: Medicare Other | Admitting: Nurse Practitioner

## 2020-06-03 DIAGNOSIS — S32020A Wedge compression fracture of second lumbar vertebra, initial encounter for closed fracture: Secondary | ICD-10-CM

## 2020-06-03 DIAGNOSIS — F418 Other specified anxiety disorders: Secondary | ICD-10-CM

## 2020-06-03 DIAGNOSIS — B3749 Other urogenital candidiasis: Secondary | ICD-10-CM | POA: Insufficient documentation

## 2020-06-03 DIAGNOSIS — I10 Essential (primary) hypertension: Secondary | ICD-10-CM

## 2020-06-03 DIAGNOSIS — R600 Localized edema: Secondary | ICD-10-CM

## 2020-06-03 DIAGNOSIS — K5909 Other constipation: Secondary | ICD-10-CM | POA: Diagnosis not present

## 2020-06-03 DIAGNOSIS — E039 Hypothyroidism, unspecified: Secondary | ICD-10-CM

## 2020-06-03 DIAGNOSIS — N3090 Cystitis, unspecified without hematuria: Secondary | ICD-10-CM

## 2020-06-03 DIAGNOSIS — D638 Anemia in other chronic diseases classified elsewhere: Secondary | ICD-10-CM

## 2020-06-03 DIAGNOSIS — K219 Gastro-esophageal reflux disease without esophagitis: Secondary | ICD-10-CM

## 2020-06-03 NOTE — Assessment & Plan Note (Signed)
HTN, blood pressure is controlled on Losartan 50mg qd.  

## 2020-06-03 NOTE — Assessment & Plan Note (Signed)
Edema BLE, minimal, takes Torsemide 20mg  qd.

## 2020-06-03 NOTE — Assessment & Plan Note (Signed)
c/o difficulty sleeping at night, frequent awakes, difficulty return to sleep, taking Melatonin sleep aid gummie, wants to try Tylenol PM-I don't recommend it given antihistamine property in elder patient. Already taking Alprazolam qhs, Sertraline and Wellbutrin.   Anxiety/insomnia, takes Alprazolam qhs, Wellbutrin 174m bid, Sertraline 539mqd.   Will increase Sertraline to 7540md, continue Wellbutrin to better manage mood, change Alprazolam to prn since it didn't help the patient to rest at night. May consider Mirtazapine if no better.   Update CBC/diff, CMP/eGFR, TSH

## 2020-06-03 NOTE — Assessment & Plan Note (Signed)
Lower back pain, takes Gabapentin 100mg  tid, prn Methocarbamol.

## 2020-06-03 NOTE — Assessment & Plan Note (Signed)
No constipation while taking prn MiraLax, Fiber daily, Colace bid.  

## 2020-06-03 NOTE — Assessment & Plan Note (Signed)
Anemia, takes Fe 3x/wk, Hgb 8.7 11/26/19

## 2020-06-03 NOTE — Assessment & Plan Note (Signed)
Hypothyroidism, takes Levothyroxine qd, TSH wnl 08/2019

## 2020-06-03 NOTE — Progress Notes (Signed)
Location:    [facility] Room Number: 39 Place of Service:  ALF [phone]) Provider: Marlana Latus NP  Virgie Dad, MD  Patient Care Team: Virgie Dad, MD as PCP - General (Internal Medicine) Taner Rzepka X, NP as Nurse Practitioner (Internal Medicine)  Extended Emergency Contact Information Primary Emergency Contact: Easterday,[name] Address: [address]          [place], [place] [address] [place] Phone: [phone] Mobile Phone: [phone] Relation: Son Secondary Emergency Contact: Midkiff,[name]  [place] Phone: [phone] Relation: None  Code Status:  DNR Goals of care: Advanced Directive information Advanced Directives 04/08/2020  Does Patient Have a Medical Advance Directive? Yes  Type of Advance Directive Out of facility DNR (pink MOST or yellow form)  Does patient want to make changes to medical advance directive? No - Patient declined  Copy of [place] in Chart? -  Pre-existing out of facility DNR order (yellow form or pink MOST form) Yellow form placed in chart (order not valid for inpatient use)     Chief Complaint  Patient presents with   Acute Visit    Insomnia, urogenital area itching    HPI:  Pt is a 86 y.o. female seen today for an acute visit for c/o difficulty sleeping at night, taking Melatonin sleep aid gummie, wants to try Tylenol PM. Already taking Alprazolam qhs, Sertraline and Wellbutrin.   Anxiety/insomnia, takes Alprazolam qhs, Wellbutrin 196m bid, Sertraline 525mqd.   Hx of recurrent UTI, currently on Nitrofurantoin 5034md for UTI suppression therapy.   Recurrent reddened urogenital area.             HTN, blood pressure is controlled on Losartan 17m25m.      Hemorrhoids, no injury.                No constipation while taking prn MiraLax, Fiber daily, Colace bid.             Anemia, takes Fe 3x/wk             Lower back pain, takes Gabapentin 100mg71m, prn  Methocarbamol.              Hypothyroidism, takes Levothyroxine 75mcg25m TSH wnl 08/2019             GERD, takes Omeprazole 40mg q57m          Edema BLE, minimal, takes Torsemide 20mg qd75m Past Medical History:  Diagnosis Date   Anemia    Anxiety    Chronotropic incompetence 12/2012    potentially medication related; noted on CPET    DDD (degenerative disc disease)    Eczema    GERD (gastroesophageal reflux disease)    H/O hiatal hernia    Hx: UTI (urinary tract infection)    Hyperlipidemia    Hypertension    Hypothyroidism    Osteopenia    Septicemia (HCC) [facility] following UTI   Vertigo, benign positional    Past Surgical History:  Procedure Laterality Date   BREAST SURGERY     left biopsy   CATARACT EXTRACTION Right    2 weeks ago   CPET / MET - PFTS     Consistent with chronotropic incompetence; See attached report in the results section   DILATION AND CURETTAGE OF UTERUS     x 2   DOPPLER ECHOCARDIOGRAPHY  01/10/2013   Normal LV  size and function. Normal EF. Air sclerosis but no stenosis   ESOPHAGOGASTRODUODENOSCOPY  05/04/2012   Procedure: ESOPHAGOGASTRODUODENOSCOPY (EGD);  Surgeon: Inda Castle, MD;  Location: [facility];  Service: Endoscopy;  Laterality: N/A;   EYE SURGERY     cataract extraction with ILO  ? eye   IR KYPHO EA ADDL LEVEL THORACIC OR LUMBAR  10/28/2016   IR KYPHO LUMBAR INC FX REDUCE BONE BX UNI/BIL CANNULATION INC/IMAGING  10/28/2016   IR KYPHO THORACIC WITH BONE BIOPSY  12/24/2016   IR RADIOLOGIST EVAL & MGMT  11/11/2016   KNEE ARTHROSCOPY  2011   right   TONSILLECTOMY     TOTAL KNEE ARTHROPLASTY  04/24/2012   Procedure: TOTAL KNEE ARTHROPLASTY;  Surgeon: Gearlean Alf, MD;  Location: [facility];  Service: Orthopedics;  Laterality: Right;   TRANSTHORACIC ECHOCARDIOGRAM  02/2018   Ordered by PCP for "congestive heart failure " -EF 60 M 65%.  GR 1 DD.  No R WMA--- > ESSENTIALLY NORMAL    No Known  Allergies  Allergies as of 06/03/2020   No Known Allergies     Medication List       Accurate as of June 03, 2020 11:59 PM. If you have any questions, ask your nurse or doctor.        ALPRAZolam 0.25 MG tablet Commonly known as: XANAX Take 1 tablet (0.25 mg total) by mouth at bedtime.   atorvastatin 10 MG tablet Commonly known as: LIPITOR Take 5 mg by mouth daily.   buPROPion 150 MG 12 hr tablet Commonly known as: WELLBUTRIN SR Take 1 tablet (150 mg total) by mouth 2 (two) times daily after a meal.   Camphor-Menthol-Methyl Sal 3.07-31-08 % Ptch Place 1 patch onto the skin. Apply one to lower back daily for back pain   CITRACAL +D3 PO Take 2 capsules by mouth 2 (two) times a day. 665m-25mcg(1000u)   docusate sodium 100 MG capsule Commonly known as: COLACE Take 100 mg by mouth 2 (two) times daily.   ferrous sulfate 325 (65 FE) MG EC tablet Take 325 mg by mouth every Monday, Wednesday, and Friday. With a meal   Fiber Powd Take 15 mLs by mouth daily. Mix with 4-8 oz of water   fish oil-omega-3 fatty acids 1000 MG capsule Take 1 g by mouth daily.   gabapentin 100 MG capsule Commonly known as: NEURONTIN Take 100 mg by mouth 3 (three) times daily.   hydrocortisone cream 1 % Apply 1 application topically daily as needed for itching. PRN to urogenital itchy area X 10 days than PRN   Klor-Con M20 20 MEQ tablet Generic drug: potassium chloride SA TAKE 1 TABLET BY MOUTH TWICE A DAY   levothyroxine 75 MCG tablet Commonly known as: SYNTHROID Take 1 tablet (75 mcg total) by mouth daily before breakfast.   losartan 50 MG tablet Commonly known as: COZAAR Take 50 mg by mouth daily.   methocarbamol 500 MG tablet Commonly known as: ROBAXIN Take 250 mg by mouth daily as needed. For muscle spasms   nitrofurantoin 50 MG capsule Commonly known as: MACRODANTIN Take 1 capsule (50 mg total) by mouth daily.   nystatin powder Commonly known as: MYCOSTATIN/NYSTOP Apply  100,000 g topically 2 (two) times daily as needed.   nystatin cream Commonly known as: MYCOSTATIN Apply 1 application topically as needed for dry skin.   omeprazole 40 MG capsule Commonly known as: PRILOSEC Take 40 mg by mouth daily.   polyethylene glycol 17 g packet Commonly  known as: MIRALAX / GLYCOLAX Take 17 g by mouth as directed. On Monday and Thursday   sertraline 50 MG tablet Commonly known as: ZOLOFT Take 50 mg by mouth daily.   torsemide 20 MG tablet Commonly known as: DEMADEX Take 20 mg by mouth daily.   traMADol 50 MG tablet Commonly known as: ULTRAM Take 1 tablet (50 mg total) by mouth every 6 (six) hours as needed.   TYLENOL 500 MG tablet Generic drug: acetaminophen Take 1,000 mg by mouth 3 (three) times daily as needed.       Review of Systems  Constitutional: Negative for appetite change, fatigue and fever.       Chronic fatigue.   HENT: Positive for hearing loss. Negative for congestion and voice change.   Eyes: Negative for visual disturbance.  Respiratory: Positive for shortness of breath. Negative for cough and wheezing.        DOE  Cardiovascular: Positive for leg swelling. Negative for chest pain and palpitations.  Gastrointestinal: Negative for abdominal pain and constipation.  Genitourinary: Negative for difficulty urinating, dysuria and urgency.       Urinary leakage.   Musculoskeletal: Positive for arthralgias, back pain and gait problem.  Skin: Positive for rash. Negative for color change.       Perineal itching and redness.   Neurological: Negative for tremors, speech difficulty, light-headedness and headaches.       Hx of vertigo, chronic  Psychiatric/Behavioral: Positive for sleep disturbance. Negative for behavioral problems. The patient is nervous/anxious.     Immunization History  Administered Date(s) Administered   Influenza, High Dose Seasonal PF 04/26/2016, 03/22/2017, 05/08/2019   Influenza,inj,Quad PF,6+ Mos 04/27/2018    Influenza-Unspecified 04/22/2011, 04/04/2012, 05/03/2013, 05/04/2013, 04/17/2014, 04/19/2015, 04/26/2016, 04/22/2017   Moderna SARS-COVID-2 Vaccination 07/30/2019, 08/27/2019   Pneumococcal Polysaccharide-23 08/26/2003, 09/06/2011, 05/15/2019   Pneumococcal-Unspecified 12/20/2013   Td 09/26/2006   Tdap 05/15/2019   Zoster 10/25/2007   Zoster Recombinat (Shingrix) 11/21/2017, 04/08/2018   Pertinent  Health Maintenance Due  Topic Date Due   INFLUENZA VACCINE  02/24/2020   PNA vac Low Risk Adult (2 of 2 - PCV13) 05/14/2020   DEXA SCAN  Completed   Fall Risk  10/18/2018 08/23/2018 05/09/2018 05/01/2018 04/10/2018  Falls in the past year? 0 0 Yes Yes No  Comment - - - [facility] June/2019 [facility] -  Number falls in past yr: 0 0 1 1 -  Injury with Fall? 0 0 No No -  Risk for fall due to : - - - Impaired balance/gait -  Follow up - - - Falls prevention discussed -   Functional Status Survey:    Vitals:   06/03/20 1111  BP: (!) 128/54  Pulse: 75  Resp: 20  Temp: (!) 97.5 F (36.4 C)  SpO2: 91%  Weight: 180 lb 3.2 oz (81.7 kg)  Height: '5\' 4"'  (1.626 m)   Body mass index is 30.93 kg/m. Physical Exam Vitals and nursing note reviewed.  Constitutional:      Appearance: Normal appearance.  HENT:     Head: Normocephalic and atraumatic.  Eyes:     Extraocular Movements: Extraocular movements intact.     Conjunctiva/sclera: Conjunctivae normal.     Pupils: Pupils are equal, round, and reactive to light.  Cardiovascular:     Rate and Rhythm: Normal rate and regular rhythm.     Heart sounds: No murmur heard.   Pulmonary:     Breath sounds: No wheezing.  Abdominal:  General: Bowel sounds are normal.     Palpations: Abdomen is soft.     Tenderness: There is no abdominal tenderness. There is no rebound.  Genitourinary:    Vagina: No vaginal discharge.     Comments: External hemorrhoid, no injury or bleeding, no apparent rash in urogenital area.    Musculoskeletal:     Cervical back: Normal range of motion and neck supple.     Right lower leg: Edema present.     Left lower leg: Edema present.     Comments: Edema trace BLE  Skin:    General: Skin is warm and dry.     Findings: Erythema and rash present.     Comments: Urogenital/perineal area redness, irritation.   Neurological:     General: No focal deficit present.     Mental Status: She is alert. Mental status is at baseline.     Motor: No weakness.     Coordination: Coordination abnormal.     Gait: Gait abnormal.     Comments: Oriented to person, place.   Psychiatric:        Mood and Affect: Mood normal.        Behavior: Behavior normal.        Thought Content: Thought content normal.     Labs reviewed: Recent Labs    08/07/19 0000 09/17/19 0000 04/10/20 0000  NA 143 140 141  K 4.3 4.2 4.1  CL 108 105 104  CO2 27* 28* 30*  BUN 31* 29* 31*  CREATININE 1.3* 1.4* 1.3*  CALCIUM  --  9.8 9.7   Recent Labs    08/07/19 0000 09/17/19 0000 04/10/20 0000  AST 24 12* 14  ALT 17 4* 8  ALKPHOS 102 104 85  ALBUMIN  --  4.0 4.0   Recent Labs    09/17/19 0000 09/17/19 0000 10/01/19 0000 11/26/19 0000 04/10/20 0000  WBC 2.8   < > 2.7 4.1 3.4  NEUTROABS 1,406  --  1,204  --  1,510.00  HGB 8.2*   < > 8.2* 8.7* 10.0*  HCT 27*   < > 27* 28* 32*  PLT 72*   < > 76* 73* 92*   < > = values in this interval not displayed.   Lab Results  Component Value Date   TSH 3.63 09/17/2019   No results found for: HGBA1C Lab Results  Component Value Date   CHOL 154 04/10/2020   HDL 47 04/10/2020   LDLCALC 87 04/10/2020   TRIG 108 04/10/2020   CHOLHDL 3.8 11/23/2018    Significant Diagnostic Results in last 30 days:  No results found.  Assessment/Plan Depression with anxiety  c/o difficulty sleeping at night, frequent awakes, difficulty return to sleep, taking Melatonin sleep aid gummie, wants to try Tylenol PM-I don't recommend it given antihistamine property in  elder patient. Already taking Alprazolam qhs, Sertraline and Wellbutrin.   Anxiety/insomnia, takes Alprazolam qhs, Wellbutrin 123m bid, Sertraline 594mqd.   Will increase Sertraline to 7570md, continue Wellbutrin to better manage mood, change Alprazolam to prn since it didn't help the patient to rest at night. May consider Mirtazapine if no better.   Update CBC/diff, CMP/eGFR, TSH    Urogenital candidiasis Chronic leaking urine and uses pads are contributory, will apply Nystatin powder bid to affected reddened urogenital area after cleansing and dry the area until healed.   Recurrent cystitis Hx of recurrent UTI, currently on Nitrofurantoin 71m63m for UTI suppression therapy.    Essential hypertension  HTN, blood pressure is controlled on Losartan 67m qd.      Chronic constipation No constipation while taking prn MiraLax, Fiber daily, Colace bid.   GERD (gastroesophageal reflux disease) Stable, continue Omeprazole.   Acquired hypothyroidism Hypothyroidism, takes Levothyroxine 726m qd, TSH wnl 08/2019   Compression fracture of L2 lumbar vertebra, closed[place], takes Gabapentin 10042mid, prn Methocarbamol.    Bilateral leg edema Edema BLE, minimal, takes Torsemide 31m79m.     Anemia of chronic disease Anemia, takes Fe 3x/wk, Hgb 8.7 11/26/19    Family/ staff Communication: plan of care reviewed with the patient and charge nurse.   Labs/tests ordered:  CBC/diff, CMP/eGFR, TSH   Time spend 40 minutes.

## 2020-06-03 NOTE — Assessment & Plan Note (Signed)
Hx of recurrent UTI, currently on Nitrofurantoin 50mg qd for UTI suppression therapy.   

## 2020-06-03 NOTE — Assessment & Plan Note (Signed)
Stable, continue Omeprazole.  

## 2020-06-03 NOTE — Assessment & Plan Note (Signed)
Chronic leaking urine and uses pads are contributory, will apply Nystatin powder bid to affected reddened urogenital area after cleansing and dry the area until healed.

## 2020-06-04 ENCOUNTER — Encounter: Payer: Self-pay | Admitting: Nurse Practitioner

## 2020-06-05 DIAGNOSIS — D649 Anemia, unspecified: Secondary | ICD-10-CM | POA: Diagnosis not present

## 2020-06-05 LAB — CBC AND DIFFERENTIAL
HCT: 31 — AB (ref 36–46)
Hemoglobin: 9.7 — AB (ref 12.0–16.0)
Neutrophils Absolute: 2213
Platelets: 77 — AB (ref 150–399)
WBC: 4.4

## 2020-06-05 LAB — BASIC METABOLIC PANEL
BUN: 27 — AB (ref 4–21)
CO2: 29 — AB (ref 13–22)
Chloride: 100 (ref 99–108)
Creatinine: 1.4 — AB (ref 0.5–1.1)
Glucose: 88
Potassium: 4 (ref 3.4–5.3)
Sodium: 137 (ref 137–147)

## 2020-06-05 LAB — COMPREHENSIVE METABOLIC PANEL
Albumin: 3.9 (ref 3.5–5.0)
Calcium: 9.6 (ref 8.7–10.7)
Globulin: 3

## 2020-06-05 LAB — HEPATIC FUNCTION PANEL
ALT: 14 (ref 7–35)
AST: 20 (ref 13–35)
Alkaline Phosphatase: 94 (ref 25–125)
Bilirubin, Total: 0.2

## 2020-06-05 LAB — CBC: RBC: 3.6 — AB (ref 3.87–5.11)

## 2020-06-05 LAB — TSH: TSH: 4.69 (ref 0.41–5.90)

## 2020-06-09 ENCOUNTER — Encounter: Payer: Self-pay | Admitting: Nurse Practitioner

## 2020-06-09 ENCOUNTER — Non-Acute Institutional Stay: Payer: Medicare Other | Admitting: Nurse Practitioner

## 2020-06-09 DIAGNOSIS — H0100A Unspecified blepharitis right eye, upper and lower eyelids: Secondary | ICD-10-CM | POA: Diagnosis not present

## 2020-06-09 DIAGNOSIS — K644 Residual hemorrhoidal skin tags: Secondary | ICD-10-CM

## 2020-06-09 DIAGNOSIS — I10 Essential (primary) hypertension: Secondary | ICD-10-CM | POA: Diagnosis not present

## 2020-06-09 DIAGNOSIS — N3090 Cystitis, unspecified without hematuria: Secondary | ICD-10-CM | POA: Diagnosis not present

## 2020-06-09 DIAGNOSIS — F418 Other specified anxiety disorders: Secondary | ICD-10-CM

## 2020-06-09 DIAGNOSIS — H0100B Unspecified blepharitis left eye, upper and lower eyelids: Secondary | ICD-10-CM

## 2020-06-09 DIAGNOSIS — K219 Gastro-esophageal reflux disease without esophagitis: Secondary | ICD-10-CM

## 2020-06-09 DIAGNOSIS — E039 Hypothyroidism, unspecified: Secondary | ICD-10-CM

## 2020-06-09 DIAGNOSIS — D638 Anemia in other chronic diseases classified elsewhere: Secondary | ICD-10-CM

## 2020-06-09 DIAGNOSIS — G8929 Other chronic pain: Secondary | ICD-10-CM

## 2020-06-09 DIAGNOSIS — R600 Localized edema: Secondary | ICD-10-CM

## 2020-06-09 DIAGNOSIS — M545 Low back pain, unspecified: Secondary | ICD-10-CM

## 2020-06-09 DIAGNOSIS — K5909 Other constipation: Secondary | ICD-10-CM

## 2020-06-09 NOTE — Progress Notes (Signed)
Location:    [facility] [place] Room Number: 28 Place of Service:  ALF [phone]) Provider:  Marlana Latus NP  Virgie Dad, MD  Patient Care Team: Virgie Dad, MD as PCP - General (Internal Medicine) Rahmel Nedved X, NP as Nurse Practitioner (Internal Medicine)  Extended Emergency Contact Information Primary Emergency Contact: Toren,[name] Address: [address]          [place], [place] [address] [place] Phone: [phone] Mobile Phone: [phone] Relation: Son Secondary Emergency Contact: Girardin,[name]  [place] Phone: [phone] Relation: None  Code Status:  DNR Goals of care: Advanced Directive information Advanced Directives 04/08/2020  Does Patient Have a Medical Advance Directive? Yes  Type of Advance Directive Out of facility DNR (pink MOST or yellow form)  Does patient want to make changes to medical advance directive? No - Patient declined  Copy of [place] in Chart? -  Pre-existing out of facility DNR order (yellow form or pink MOST form) Yellow form placed in chart (order not valid for inpatient use)     Chief Complaint  Patient presents with   Acute Visit    Itching eyes    HPI:  Pt is a 86 y.o. female seen today for an acute visit for itching eyes, no conjunctival redness, no change of vision or eye pain. Crusted eyelashes noted.   Anxiety/insomnia, takes Alprazolam qhs prn,  Wellbutrin 156m bid, Sertraline 718mqd since 06/03/20             Hx of recurrent UTI, currently on Nitrofurantoin 5060md for UTI suppression therapy.  HTN, blood pressure is controlled on Losartan 66m29m.       Hemorrhoids, no injury.             No constipation while taking prn MiraLax, Fiber daily, Colace bid. Anemia, takes Fe 3x/wk, Hgb 9.7 06/05/20 Lower back pain, takes Gabapentin 100mg57m, prn Methocarbamol.  Hypothyroidism, takes  Levothyroxine 75mcg62m TSH 4.69 06/05/20 GERD, takes Omeprazole 40mg q72mema BLE, minimal, takes Torsemide 20mg qd48mn/creat 27/1.4 06/05/20   Past Medical History:  Diagnosis Date   Anemia    Anxiety    Chronotropic incompetence 12/2012    potentially medication related; noted on CPET    DDD (degenerative disc disease)    Eczema    GERD (gastroesophageal reflux disease)    H/O hiatal hernia    Hx: UTI (urinary tract infection)    Hyperlipidemia    Hypertension    Hypothyroidism    Osteopenia    Septicemia (HCC) [facility] [place] following UTI   Vertigo, benign positional    Past Surgical History:  Procedure Laterality Date   BREAST SURGERY     left biopsy   CATARACT EXTRACTION Right    2 weeks ago   CPET / MET - PFTS     Consistent with chronotropic incompetence; See attached report in the results section   DILATION AND CURETTAGE OF UTERUS     x 2   DOPPLER ECHOCARDIOGRAPHY  01/10/2013   Normal LV size and function. Normal EF. Air sclerosis but no stenosis   ESOPHAGOGASTRODUODENOSCOPY  05/04/2012   Procedure: ESOPHAGOGASTRODUODENOSCOPY (EGD);  Surgeon: Robert DInda Castleocation: [facility];  Service: Endoscopy;  Laterality: N/A;   EYE SURGERY     cataract extraction with ILO  ? eye   IR KYPHO EA ADDL LEVEL THORACIC OR LUMBAR  10/28/2016   IR KYPHO LUMBAR  INC FX REDUCE BONE BX UNI/BIL CANNULATION INC/IMAGING  10/28/2016   IR KYPHO THORACIC WITH BONE BIOPSY  12/24/2016   IR RADIOLOGIST EVAL & MGMT  11/11/2016   KNEE ARTHROSCOPY  2011   right   TONSILLECTOMY     TOTAL KNEE ARTHROPLASTY  04/24/2012   Procedure: TOTAL KNEE ARTHROPLASTY;  Surgeon: Gearlean Alf, MD;  Location: [facility];  Service: Orthopedics;  Laterality: Right;   TRANSTHORACIC ECHOCARDIOGRAM  02/2018   Ordered by PCP for "congestive heart failure " -EF 60 M 65%.  GR 1 DD.  No R WMA--- > ESSENTIALLY NORMAL    No Known Allergies  Allergies as of 06/09/2020     No Known Allergies     Medication List       Accurate as of June 09, 2020 11:59 PM. If you have any questions, ask your nurse or doctor.        ALPRAZolam 0.25 MG tablet Commonly known as: XANAX Take 1 tablet (0.25 mg total) by mouth at bedtime.   atorvastatin 10 MG tablet Commonly known as: LIPITOR Take 5 mg by mouth daily.   buPROPion 150 MG 12 hr tablet Commonly known as: WELLBUTRIN SR Take 1 tablet (150 mg total) by mouth 2 (two) times daily after a meal.   Camphor-Menthol-Methyl Sal 3.07-31-08 % Ptch Place 1 patch onto the skin. Apply one to lower back daily for back pain   CITRACAL +D3 PO Take 2 capsules by mouth 2 (two) times a day. 610m-25mcg(1000u)   docusate sodium 100 MG capsule Commonly known as: COLACE Take 100 mg by mouth 2 (two) times daily.   ferrous sulfate 325 (65 FE) MG EC tablet Take 325 mg by mouth every Monday, Wednesday, and Friday. With a meal   Fiber Powd Take 15 mLs by mouth daily. Mix with 4-8 oz of water   fish oil-omega-3 fatty acids 1000 MG capsule Take 1 g by mouth daily.   gabapentin 100 MG capsule Commonly known as: NEURONTIN Take 100 mg by mouth 3 (three) times daily.   hydrocortisone cream 1 % Apply 1 application topically daily as needed for itching. PRN to urogenital itchy area X 10 days than PRN   Klor-Con M20 20 MEQ tablet Generic drug: potassium chloride SA TAKE 1 TABLET BY MOUTH TWICE A DAY   levothyroxine 75 MCG tablet Commonly known as: SYNTHROID Take 1 tablet (75 mcg total) by mouth daily before breakfast.   losartan 50 MG tablet Commonly known as: COZAAR Take 50 mg by mouth daily.   methocarbamol 500 MG tablet Commonly known as: ROBAXIN Take 250 mg by mouth daily as needed. For muscle spasms   nitrofurantoin 50 MG capsule Commonly known as: MACRODANTIN Take 1 capsule (50 mg total) by mouth daily.   nystatin powder Commonly known as: MYCOSTATIN/NYSTOP Apply 100,000 g topically 2 (two) times daily as  needed.   nystatin cream Commonly known as: MYCOSTATIN Apply 1 application topically as needed for dry skin.   omeprazole 40 MG capsule Commonly known as: PRILOSEC Take 40 mg by mouth daily.   polyethylene glycol 17 g packet Commonly known as: MIRALAX / GLYCOLAX Take 17 g by mouth as directed. On Monday and Thursday   sertraline 25 MG tablet Commonly known as: ZOLOFT Take 75 mg by mouth daily. 3 tablets=75 mg daily   torsemide 20 MG tablet Commonly known as: DEMADEX Take 20 mg by mouth daily.   traMADol 50 MG tablet Commonly known as: ULTRAM Take 1 tablet (50 mg  total) by mouth every 6 (six) hours as needed.   TYLENOL 500 MG tablet Generic drug: acetaminophen Take 1,000 mg by mouth 3 (three) times daily as needed.       Review of Systems  Constitutional: Negative for appetite change, fatigue and fever.       Chronic fatigue.   HENT: Positive for hearing loss. Negative for congestion and voice change.   Eyes: Positive for discharge and itching. Negative for pain, redness and visual disturbance.       Crusted eyelashes.   Respiratory: Positive for shortness of breath. Negative for cough and wheezing.        DOE  Cardiovascular: Positive for leg swelling. Negative for chest pain and palpitations.  Gastrointestinal: Negative for abdominal pain and constipation.  Genitourinary: Negative for difficulty urinating, dysuria and urgency.       Urinary leakage.   Musculoskeletal: Positive for arthralgias, back pain and gait problem.  Skin: Negative for color change.  Neurological: Negative for tremors, speech difficulty, light-headedness and headaches.       Hx of vertigo, chronic  Psychiatric/Behavioral: Negative for behavioral problems and sleep disturbance. The patient is not nervous/anxious.     Immunization History  Administered Date(s) Administered   Influenza, High Dose Seasonal PF 04/26/2016, 03/22/2017, 05/08/2019   Influenza,inj,Quad PF,6+ Mos 04/27/2018    Influenza-Unspecified 04/22/2011, 04/04/2012, 05/03/2013, 05/04/2013, 04/17/2014, 04/19/2015, 04/26/2016, 04/22/2017   Moderna SARS-COVID-2 Vaccination 07/30/2019, 08/27/2019   Pneumococcal Polysaccharide-23 08/26/2003, 09/06/2011, 05/15/2019   Pneumococcal-Unspecified 12/20/2013   Td 09/26/2006   Tdap 05/15/2019   Zoster 10/25/2007   Zoster Recombinat (Shingrix) 11/21/2017, 04/08/2018   Pertinent  Health Maintenance Due  Topic Date Due   INFLUENZA VACCINE  02/24/2020   PNA vac Low Risk Adult (2 of 2 - PCV13) 05/14/2020   DEXA SCAN  Completed   Fall Risk  10/18/2018 08/23/2018 05/09/2018 05/01/2018 04/10/2018  Falls in the past year? 0 0 Yes Yes No  Comment - - - fell in June/2019 [facility] -  Number falls in past yr: 0 0 1 1 -  Injury with Fall? 0 0 No No -  Risk for fall due to : - - - Impaired balance/gait -  Follow up - - - Falls prevention discussed -   Functional Status Survey:    Vitals:   06/09/20 0950  BP: (!) 115/56  Pulse: 62  Resp: 19  Temp: (!) 97.3 F (36.3 C)  SpO2: 91%  Weight: 180 lb 3.2 oz (81.7 kg)  Height: '5\' 4"'  (1.626 m)   Body mass index is 30.93 kg/m. Physical Exam Vitals and nursing note reviewed.  Constitutional:      Appearance: Normal appearance.  HENT:     Head: Normocephalic and atraumatic.  Eyes:     Extraocular Movements: Extraocular movements intact.     Conjunctiva/sclera: Conjunctivae normal.     Pupils: Pupils are equal, round, and reactive to light.     Comments: Crusted eyelashes.   Cardiovascular:     Rate and Rhythm: Normal rate and regular rhythm.     Heart sounds: No murmur heard.   Pulmonary:     Breath sounds: No wheezing.  Abdominal:     General: Bowel sounds are normal.     Palpations: Abdomen is soft.     Tenderness: There is no abdominal tenderness. There is no rebound.  Genitourinary:    Comments: External hemorrhoid per previous examination.  Musculoskeletal:     Cervical back:  Normal range of motion  and neck supple.     Right lower leg: Edema present.     Left lower leg: Edema present.     Comments: Edema trace BLE  Skin:    General: Skin is warm and dry.  Neurological:     General: No focal deficit present.     Mental Status: She is alert. Mental status is at baseline.     Motor: No weakness.     Coordination: Coordination abnormal.     Gait: Gait abnormal.     Comments: Oriented to person, place.   Psychiatric:        Mood and Affect: Mood normal.        Behavior: Behavior normal.        Thought Content: Thought content normal.     Labs reviewed: Recent Labs    09/17/19 0000 04/10/20 0000 06/05/20 0000  NA 140 141 137  K 4.2 4.1 4.0  CL 105 104 100  CO2 28* 30* 29*  BUN 29* 31* 27*  CREATININE 1.4* 1.3* 1.4*  CALCIUM 9.8 9.7 9.6   Recent Labs    09/17/19 0000 04/10/20 0000 06/05/20 0000  AST 12* 14 20  ALT 4* 8 14  ALKPHOS 104 85 94  ALBUMIN 4.0 4.0 3.9   Recent Labs    10/01/19 0000 10/01/19 0000 11/26/19 0000 04/10/20 0000 06/05/20 0000  WBC 2.7   < > 4.1 3.4 4.4  NEUTROABS 1,204  --   --  1,510.00 2,213.00  HGB 8.2*   < > 8.7* 10.0* 9.7*  HCT 27*   < > 28* 32* 31*  PLT 76*   < > 73* 92* 77*   < > = values in this interval not displayed.   Lab Results  Component Value Date   TSH 4.69 06/05/2020   No results found for: HGBA1C Lab Results  Component Value Date   CHOL 154 04/10/2020   HDL 47 04/10/2020   LDLCALC 87 04/10/2020   TRIG 108 04/10/2020   CHOLHDL 3.8 11/23/2018    Significant Diagnostic Results in last 30 days:  No results found.  Assessment/Plan Blepharitis of both eyes  itching eyes, no conjunctival redness, no change of vision or eye pain. Crusted eyelashes noted.  Will apply 0.5% Erythromycin ophthalmic oint 1cm ribbon to lower conjunctival sac nightly x 4 weeks.   Depression with anxiety Anxiety/insomnia, takes Alprazolam qhs prn,  Wellbutrin 116m bid, Sertraline 711mqd since  06/03/20  Recurrent cystitis Hx of recurrent UTI, currently on Nitrofurantoin 5033md for UTI suppression therapy.    Essential hypertension HTN, blood pressure is controlled on Losartan 12m33m.    External hemorrhoids Hemorrhoids, no injury.  Chronic constipation No constipation while taking prn MiraLax, Fiber daily, Colace bid.   Anemia of chronic disease Anemia, takes Fe 3x/wk, Hgb 9.7 06/05/20   Chronic back pain Lower back pain, takes Gabapentin 100mg44m, prn Methocarbamol.    Acquired hypothyroidism Hypothyroidism, takes Levothyroxine 75mcg66m TSH 4.69 06/05/20   GERD (gastroesophageal reflux disease) GERD, takes Omeprazole 40mg q68mBilateral leg edema Edema BLE, minimal, takes Torsemide 20mg qd11mn/creat 27/1.4 06/05/20      Family/ staff Communication: plan of care reviewed with the patient and charge nurse.   Labs/tests ordered:  None   Time spend 35 minutes.

## 2020-06-10 ENCOUNTER — Encounter: Payer: Self-pay | Admitting: Nurse Practitioner

## 2020-06-10 DIAGNOSIS — H01006 Unspecified blepharitis left eye, unspecified eyelid: Secondary | ICD-10-CM | POA: Insufficient documentation

## 2020-06-10 DIAGNOSIS — H01003 Unspecified blepharitis right eye, unspecified eyelid: Secondary | ICD-10-CM | POA: Insufficient documentation

## 2020-06-10 NOTE — Assessment & Plan Note (Signed)
itching eyes, no conjunctival redness, no change of vision or eye pain. Crusted eyelashes noted.  Will apply 0.5% Erythromycin ophthalmic oint 1cm ribbon to lower conjunctival sac nightly x 4 weeks.

## 2020-06-10 NOTE — Assessment & Plan Note (Signed)
Edema BLE, minimal, takes Torsemide 20mg  qd. Bun/creat 27/1.4 06/05/20

## 2020-06-10 NOTE — Assessment & Plan Note (Signed)
Anxiety/insomnia, takes Alprazolam qhs prn,  Wellbutrin 150mg bid, Sertraline 75mg qd since 06/03/20  

## 2020-06-10 NOTE — Assessment & Plan Note (Signed)
No constipation while taking prn MiraLax, Fiber daily, Colace bid.  

## 2020-06-10 NOTE — Assessment & Plan Note (Signed)
Hx of recurrent UTI, currently on Nitrofurantoin 50mg qd for UTI suppression therapy.   

## 2020-06-10 NOTE — Assessment & Plan Note (Signed)
Anemia, takes Fe 3x/wk, Hgb 9.7 06/05/20  

## 2020-06-10 NOTE — Assessment & Plan Note (Signed)
Hypothyroidism, takes Levothyroxine qd, TSH 4.69 06/05/20

## 2020-06-10 NOTE — Assessment & Plan Note (Signed)
Lower back pain, takes Gabapentin 100mg tid, prn Methocarbamol.   

## 2020-06-10 NOTE — Assessment & Plan Note (Signed)
HTN, blood pressure is controlled on Losartan 50mg qd.  

## 2020-06-10 NOTE — Assessment & Plan Note (Signed)
GERD, takes Omeprazole 40mg  qd

## 2020-06-10 NOTE — Assessment & Plan Note (Signed)
Hemorrhoids, no injury. 

## 2020-06-11 ENCOUNTER — Encounter: Payer: Self-pay | Admitting: Nurse Practitioner

## 2020-07-01 ENCOUNTER — Non-Acute Institutional Stay: Payer: Medicare Other | Admitting: Nurse Practitioner

## 2020-07-01 ENCOUNTER — Encounter: Payer: Self-pay | Admitting: Nurse Practitioner

## 2020-07-01 DIAGNOSIS — I10 Essential (primary) hypertension: Secondary | ICD-10-CM | POA: Diagnosis not present

## 2020-07-01 DIAGNOSIS — E039 Hypothyroidism, unspecified: Secondary | ICD-10-CM

## 2020-07-01 DIAGNOSIS — N3090 Cystitis, unspecified without hematuria: Secondary | ICD-10-CM | POA: Diagnosis not present

## 2020-07-01 DIAGNOSIS — K5909 Other constipation: Secondary | ICD-10-CM

## 2020-07-01 DIAGNOSIS — K644 Residual hemorrhoidal skin tags: Secondary | ICD-10-CM

## 2020-07-01 DIAGNOSIS — F339 Major depressive disorder, recurrent, unspecified: Secondary | ICD-10-CM

## 2020-07-01 DIAGNOSIS — M545 Low back pain, unspecified: Secondary | ICD-10-CM

## 2020-07-01 DIAGNOSIS — H0100B Unspecified blepharitis left eye, upper and lower eyelids: Secondary | ICD-10-CM

## 2020-07-01 DIAGNOSIS — G8929 Other chronic pain: Secondary | ICD-10-CM

## 2020-07-01 DIAGNOSIS — I739 Peripheral vascular disease, unspecified: Secondary | ICD-10-CM

## 2020-07-01 DIAGNOSIS — D638 Anemia in other chronic diseases classified elsewhere: Secondary | ICD-10-CM

## 2020-07-01 DIAGNOSIS — K219 Gastro-esophageal reflux disease without esophagitis: Secondary | ICD-10-CM

## 2020-07-01 DIAGNOSIS — H0100A Unspecified blepharitis right eye, upper and lower eyelids: Secondary | ICD-10-CM

## 2020-07-01 NOTE — Assessment & Plan Note (Signed)
Hx of recurrent UTI, currently on Nitrofurantoin 50mg qd for UTI suppression therapy.   

## 2020-07-01 NOTE — Assessment & Plan Note (Signed)
HTN, blood pressure is controlled on Losartan 50mg qd.  

## 2020-07-01 NOTE — Progress Notes (Signed)
Location:    [facility] Room Number: 39 Place of Service:  ALF [phone]) Provider: Marlana Latus NP  Virgie Dad, MD  Patient Care Team: Virgie Dad, MD as PCP - General (Internal Medicine) Havanna Groner X, NP as Nurse Practitioner (Internal Medicine)  Extended Emergency Contact Information Primary Emergency Contact: Marsolek,[name] Address: [address]          [place], [place] [address] [place] Phone: [phone] Mobile Phone: [phone] Relation: Son Secondary Emergency Contact: [place],[name]  [place] Phone: [phone] Relation: None  Code Status:  DNR Goals of care: Advanced Directive information Advanced Directives 04/08/2020  Does Patient Have a Medical Advance Directive? Yes  Type of Advance Directive Out of facility DNR (pink MOST or yellow form)  Does patient want to make changes to medical advance directive? No - Patient declined  Copy of [place] in Chart? -  Pre-existing out of facility DNR order (yellow form or pink MOST form) Yellow form placed in chart (order not valid for inpatient use)     Chief Complaint  Patient presents with  . Medical Management of Chronic Issues    HPI:  Pt is a 86 y.o. female seen today for medical management of chronic diseases.   Anxiety/insomnia, takes Alprazolam qhs prn,  Wellbutrin 175m bid, Sertraline 719mqd since 06/03/20 Hx of recurrent UTI, currently on Nitrofurantoin 5023md for UTI suppression therapy.  HTN, blood pressure is controlled on Losartan 60m12m.              Hemorrhoids, no injury. No constipation while taking prn MiraLax, Fiber daily, Colace bid. Anemia, takes Fe 3x/wk, Hgb 9.7 06/05/20 Lower back pain, takes Gabapentin 100mg8m, prn Methocarbamol.  Hypothyroidism, takes Levothyroxine 75mcg13m TSH 4.69 06/05/20 GERD, takes  Omeprazole 40mg q1mema BLE, minimal, takes Torsemide 20mg qd33mn/creat 27/1.4 06/05/20  Blepharitis, better, on Erythromycin ophthalmic oint   Past Medical History:  Diagnosis Date  . Anemia   . Anxiety   . Chronotropic incompetence 12/2012    potentially medication related; noted on CPET   . DDD (degenerative disc disease)   . Eczema   . GERD (gastroesophageal reflux disease)   . H/O hiatal hernia   . Hx: UTI (urinary tract infection)   . Hyperlipidemia   . Hypertension   . Hypothyroidism   . Osteopenia   . Septicemia (HCC) [address] following UTI  . Vertigo, benign positional    Past Surgical History:  Procedure Laterality Date  . BREAST SURGERY     left biopsy  . CATARACT EXTRACTION Right    2 weeks ago  . CPET / MET - PFTS     Consistent with chronotropic incompetence; See attached report in the results section  . DILATION AND CURETTAGE OF UTERUS     x 2  . DOPPLER ECHOCARDIOGRAPHY  01/10/2013   Normal LV size and function. Normal EF. Air sclerosis but no stenosis  . ESOPHAGOGASTRODUODENOSCOPY  05/04/2012   Procedure: ESOPHAGOGASTRODUODENOSCOPY (EGD);  Surgeon: Robert DInda Castleocation: [facility];  Service: Endoscopy;  Laterality: N/A;  . EYE SURGERY     cataract extraction with ILO  ? eye  . IR KYPHO EA ADDL LEVEL THORACIC OR LUMBAR  10/28/2016  . IR KYPHO LUMBAR INC FX REDUCE BONE BX UNI/BIL CANNULATION INC/IMAGING  10/28/2016  . IR KYPHO THORACIC WITH BONE BIOPSY  12/24/2016  . IR RADIOLOGIST EVAL & MGMT  11/11/2016  . KNEE ARTHROSCOPY  2011   right  . TONSILLECTOMY    . TOTAL KNEE ARTHROPLASTY  04/24/2012   Procedure: TOTAL KNEE ARTHROPLASTY;  Surgeon: Gearlean Alf, MD;  Location: [facility];  Service: Orthopedics;  Laterality: Right;  . TRANSTHORACIC ECHOCARDIOGRAM  02/2018   Ordered by PCP for "congestive heart failure " -EF 60 M 65%.  GR 1 DD.  No R WMA--- > ESSENTIALLY NORMAL    No Known Allergies  Allergies as of 07/01/2020   No Known  Allergies     Medication List       Accurate as of July 01, 2020  4:14 PM. If you have any questions, ask your nurse or doctor.        ALPRAZolam 0.25 MG tablet Commonly known as: XANAX Take 1 tablet (0.25 mg total) by mouth at bedtime.   atorvastatin 10 MG tablet Commonly known as: LIPITOR Take 5 mg by mouth daily.   buPROPion 150 MG 12 hr tablet Commonly known as: WELLBUTRIN SR Take 1 tablet (150 mg total) by mouth 2 (two) times daily after a meal.   Camphor-Menthol-Methyl Sal 3.07-31-08 % Ptch Place 1 patch onto the skin. Apply one to lower back daily for back pain   CITRACAL +D3 PO Take 2 capsules by mouth 2 (two) times a day. 670m-25mcg(1000u)   docusate sodium 100 MG capsule Commonly known as: COLACE Take 100 mg by mouth 2 (two) times daily.   erythromycin ophthalmic ointment Place 1 application into both eyes at bedtime.   ferrous sulfate 325 (65 FE) MG EC tablet Take 325 mg by mouth every Monday, Wednesday, and Friday. With a meal   Fiber Powd Take 15 mLs by mouth daily. Mix with 4-8 oz of water   fish oil-omega-3 fatty acids 1000 MG capsule Take 1 g by mouth daily.   gabapentin 100 MG capsule Commonly known as: NEURONTIN Take 100 mg by mouth 3 (three) times daily.   hydrocortisone cream 1 % Apply 1 application topically daily as needed for itching. PRN to urogenital itchy area X 10 days than PRN   Klor-Con M20 20 MEQ tablet Generic drug: potassium chloride SA TAKE 1 TABLET BY MOUTH TWICE A DAY   levothyroxine 75 MCG tablet Commonly known as: SYNTHROID Take 1 tablet (75 mcg total) by mouth daily before breakfast.   losartan 50 MG tablet Commonly known as: COZAAR Take 50 mg by mouth daily.   methocarbamol 500 MG tablet Commonly known as: ROBAXIN Take 250 mg by mouth daily as needed. For muscle spasms   nitrofurantoin 50 MG capsule Commonly known as: MACRODANTIN Take 1 capsule (50 mg total) by mouth daily.   nystatin powder Commonly  known as: MYCOSTATIN/NYSTOP Apply 100,000 g topically 2 (two) times daily as needed.   nystatin cream Commonly known as: MYCOSTATIN Apply 1 application topically as needed for dry skin.   omeprazole 40 MG capsule Commonly known as: PRILOSEC Take 40 mg by mouth daily.   polyethylene glycol 17 g packet Commonly known as: MIRALAX / GLYCOLAX Take 17 g by mouth as directed. On Monday and Thursday   sertraline 25 MG tablet Commonly known as: ZOLOFT Take 75 mg by mouth daily. 3 tablets=75 mg daily   torsemide 20 MG tablet Commonly known as: DEMADEX Take 20 mg by mouth daily.   traMADol 50 MG tablet Commonly known as: ULTRAM Take 1 tablet (50 mg total) by mouth every 6 (six) hours as needed.   TYLENOL 500 MG tablet  Generic drug: acetaminophen Take 1,000 mg by mouth 3 (three) times daily as needed.       Review of Systems  Constitutional: Negative for fatigue, fever and unexpected weight change.       Chronic fatigue.   HENT: Positive for hearing loss. Negative for congestion and voice change.   Eyes: Negative for visual disturbance.  Respiratory: Positive for shortness of breath. Negative for cough and wheezing.        DOE  Cardiovascular: Positive for leg swelling. Negative for chest pain and palpitations.  Gastrointestinal: Negative for abdominal pain and constipation.  Genitourinary: Negative for difficulty urinating, dysuria and urgency.       Urinary leakage.   Musculoskeletal: Positive for arthralgias, back pain and gait problem.  Skin: Negative for color change.  Neurological: Negative for tremors, speech difficulty, light-headedness and headaches.       Hx of vertigo, chronic  Psychiatric/Behavioral: Negative for behavioral problems and sleep disturbance. The patient is not nervous/anxious.     Immunization History  Administered Date(s) Administered  . Influenza, High Dose Seasonal PF 04/26/2016, 03/22/2017, 05/08/2019  . Influenza,inj,Quad PF,6+ Mos 04/27/2018   . Influenza-Unspecified 04/22/2011, 04/04/2012, 05/03/2013, 05/04/2013, 04/17/2014, 04/19/2015, 04/26/2016, 04/22/2017  . Moderna SARS-COVID-2 Vaccination 07/30/2019, 08/27/2019  . Pneumococcal Polysaccharide-23 08/26/2003, 09/06/2011, 05/15/2019  . Pneumococcal-Unspecified 12/20/2013  . Td 09/26/2006  . Tdap 05/15/2019  . Zoster 10/25/2007  . Zoster Recombinat (Shingrix) 11/21/2017, 04/08/2018   Pertinent  Health Maintenance Due  Topic Date Due  . INFLUENZA VACCINE  02/24/2020  . PNA vac Low Risk Adult (2 of 2 - PCV13) 05/14/2020  . DEXA SCAN  Completed   Fall Risk  10/18/2018 08/23/2018 05/09/2018 05/01/2018 04/10/2018  Falls in the past year? 0 0 Yes Yes No  Comment - - - [facility] June/2019 [facility] -  Number falls in past yr: 0 0 1 1 -  Injury with Fall? 0 0 No No -  Risk for fall due to : - - - Impaired balance/gait -  Follow up - - - Falls prevention discussed -   Functional Status Survey:    Vitals:   07/01/20 1540  BP: 139/63  Pulse: 62  Resp: 20  Temp: (!) 96.8 F (36 C)  SpO2: 91%  Weight: 176 lb 12.8 oz (80.2 kg)  Height: '5\' 4"'  (1.626 m)   Body mass index is 30.35 kg/m. Physical Exam Vitals and nursing note reviewed.  Constitutional:      Appearance: Normal appearance.  HENT:     Head: Normocephalic and atraumatic.  Eyes:     Extraocular Movements: Extraocular movements intact.     Conjunctiva/sclera: Conjunctivae normal.     Pupils: Pupils are equal, round, and reactive to light.  Cardiovascular:     Rate and Rhythm: Normal rate and regular rhythm.     Heart sounds: No murmur heard.   Pulmonary:     Breath sounds: No wheezing.  Abdominal:     General: Bowel sounds are normal.     Palpations: Abdomen is soft.     Tenderness: There is no abdominal tenderness. There is no rebound.  Genitourinary:    Comments: External hemorrhoid per previous examination.  Musculoskeletal:     Cervical back: Normal range of motion and neck supple.      Right lower leg: Edema present.     Left lower leg: Edema present.     Comments: Edema trace BLE  Skin:    General: Skin is warm and dry.  Neurological:     General: No focal deficit present.     Mental Status: She is alert. Mental status is at baseline.     Motor: No weakness.     Coordination: Coordination abnormal.     Gait: Gait abnormal.     Comments: Oriented to person, place.   Psychiatric:        Mood and Affect: Mood normal.        Behavior: Behavior normal.        Thought Content: Thought content normal.     Labs reviewed: Recent Labs    09/17/19 0000 04/10/20 0000 06/05/20 0000  NA 140 141 137  K 4.2 4.1 4.0  CL 105 104 100  CO2 28* 30* 29*  BUN 29* 31* 27*  CREATININE 1.4* 1.3* 1.4*  CALCIUM 9.8 9.7 9.6   Recent Labs    09/17/19 0000 04/10/20 0000 06/05/20 0000  AST 12* 14 20  ALT 4* 8 14  ALKPHOS 104 85 94  ALBUMIN 4.0 4.0 3.9   Recent Labs    10/01/19 0000 10/01/19 0000 11/26/19 0000 04/10/20 0000 06/05/20 0000  WBC 2.7   < > 4.1 3.4 4.4  NEUTROABS 1,204  --   --  1,510.00 2,213.00  HGB 8.2*   < > 8.7* 10.0* 9.7*  HCT 27*   < > 28* 32* 31*  PLT 76*   < > 73* 92* 77*   < > = values in this interval not displayed.   Lab Results  Component Value Date   TSH 4.69 06/05/2020   No results found for: HGBA1C Lab Results  Component Value Date   CHOL 154 04/10/2020   HDL 47 04/10/2020   LDLCALC 87 04/10/2020   TRIG 108 04/10/2020   CHOLHDL 3.8 11/23/2018    Significant Diagnostic Results in last 30 days:  No results found.  Assessment/Plan Depression, recurrent (HCC) Anxiety/insomnia, takes Alprazolam qhs prn,  Wellbutrin 119m bid, Sertraline 772mqd since 06/03/20   Recurrent cystitis Hx of recurrent UTI, currently on Nitrofurantoin 5069md for UTI suppression therapy.    Essential hypertension HTN, blood pressure is controlled on Losartan 67m72m.   External hemorrhoids Hemorrhoids, no injury.  Chronic  constipation No constipation while taking prn MiraLax, Fiber daily, Colace bid.   Anemia of chronic disease Anemia, takes Fe 3x/wk, Hgb 9.7 06/05/20   Chronic back pain Lower back pain, takes Gabapentin 100mg68m, prn Methocarbamol.    Acquired hypothyroidism Hypothyroidism, takes Levothyroxine 75mcg34m TSH 4.69 06/05/20   GERD (gastroesophageal reflux disease) GERD, takes Omeprazole 40mg q35mlepharitis of both eyes Better, continue Erythromycin ophthalmic oint.   PVD (peripheral vascular disease) (HCC) Edema BLE, minimal, takes Torsemide 20mg qd9mn/creat 27/1.4 06/05/20    Family/ staff Communication: plan of care reviewed with the patient and charge nurse.   Labs/tests ordered:  None   Time spend 40 minutes.

## 2020-07-01 NOTE — Assessment & Plan Note (Signed)
GERD, takes Omeprazole 40mg qd  

## 2020-07-01 NOTE — Assessment & Plan Note (Signed)
Hypothyroidism, takes Levothyroxine 75mcg qd, TSH 4.69 06/05/20  

## 2020-07-01 NOTE — Assessment & Plan Note (Signed)
Better, continue Erythromycin ophthalmic oint.

## 2020-07-01 NOTE — Assessment & Plan Note (Signed)
No constipation while taking prn MiraLax, Fiber daily, Colace bid.  

## 2020-07-01 NOTE — Assessment & Plan Note (Signed)
Anxiety/insomnia, takes Alprazolam qhs prn,  Wellbutrin 150mg  bid, Sertraline 75mg  qd since 06/03/20

## 2020-07-01 NOTE — Assessment & Plan Note (Signed)
Anemia, takes Fe 3x/wk, Hgb 9.7 06/05/20

## 2020-07-01 NOTE — Assessment & Plan Note (Signed)
Lower back pain, takes Gabapentin 100mg tid, prn Methocarbamol.   

## 2020-07-01 NOTE — Assessment & Plan Note (Signed)
Hemorrhoids, no injury.

## 2020-07-01 NOTE — Assessment & Plan Note (Signed)
Edema BLE, minimal, takes Torsemide 20mg qd. Bun/creat 27/1.4 06/05/20  

## 2020-07-28 ENCOUNTER — Other Ambulatory Visit: Payer: Self-pay | Admitting: *Deleted

## 2020-07-28 MED ORDER — TRAMADOL HCL 50 MG PO TABS: 50.0000 mg | ORAL_TABLET | Freq: Four times a day (QID) | ORAL | 0 refills | Status: DC | PRN

## 2020-07-28 NOTE — Telephone Encounter (Signed)
Received refill request from [facility] and sent to Dr. [name] for approval.

## 2020-07-29 DIAGNOSIS — M6281 Muscle weakness (generalized): Secondary | ICD-10-CM | POA: Diagnosis not present

## 2020-07-31 DIAGNOSIS — M6281 Muscle weakness (generalized): Secondary | ICD-10-CM | POA: Diagnosis not present

## 2020-08-07 DIAGNOSIS — M6281 Muscle weakness (generalized): Secondary | ICD-10-CM | POA: Diagnosis not present

## 2020-08-08 DIAGNOSIS — M6281 Muscle weakness (generalized): Secondary | ICD-10-CM | POA: Diagnosis not present

## 2020-08-19 ENCOUNTER — Non-Acute Institutional Stay (INDEPENDENT_AMBULATORY_CARE_PROVIDER_SITE_OTHER): Payer: Medicare Other | Admitting: Nurse Practitioner

## 2020-08-19 ENCOUNTER — Encounter: Payer: Self-pay | Admitting: Nurse Practitioner

## 2020-08-19 DIAGNOSIS — Z0000 Encounter for general adult medical examination without abnormal findings: Secondary | ICD-10-CM | POA: Diagnosis not present

## 2020-08-20 ENCOUNTER — Encounter: Payer: Self-pay | Admitting: Nurse Practitioner

## 2020-08-20 NOTE — Progress Notes (Signed)
Subjective:   [name] [name] is a 87 y.o. female who presents for Medicare Annual (Subsequent) preventive examination in Assisted Living at [facility].  Cardiac Risk Factors include: advanced age (>76mn, >>29women);sedentary lifestyle;obesity (BMI >30kg/m2);hypertension    Objective:    Today's Vitals   08/19/20 1115  BP: (!) 149/82  Pulse: 74  Resp: 16  Temp: (!) 96.6 F (35.9 C)  SpO2: 90%  Weight: 179 lb 9.6 oz (81.5 kg)  Height: 5' 4" (1.626 m)   Body mass index is 30.83 kg/m.  Advanced Directives 04/08/2020 12/27/2019 11/05/2019 10/23/2019 09/14/2019 09/07/2019 08/14/2019  Does Patient Have a Medical Advance Directive? _0  Yes Yes  Type of Advance Directive Out of facility DNR (pink MOST or yellow form) Out of facility DNR (pink MOST or yellow form) Out of facility DNR (pink MOST or yellow form) Healthcare Power of [place] (pink MOST or yellow form) [place] (pink MOST or yellow form) Out of facility DNR (pink MOST or yellow form);Healthcare Power of Attorney  Does patient want to make changes to medical advance directive? No - Patient declined No - Patient declined No - Patient declined No - Patient declined No - Patient declined No - Patient declined No - Patient declined  Copy of [place] Chart? - - - Yes - validated most recent copy scanned in chart (See row information) Yes - validated most recent copy scanned in chart (See row information) Yes - validated most recent copy scanned in chart (See row information) Yes - validated most recent copy scanned in chart (See row information)  Pre-existing out of facility DNR order (yellow form or pink MOST form) Yellow form placed in chart (order not valid for inpatient use) Yellow form placed in chart (order not valid for inpatient use) Yellow form placed in chart (order not valid for inpatient use) - Yellow  form placed in chart (order not valid for inpatient use) Pink MOST/Yellow Form most recent copy in chart - Physician notified to receive inpatient order Yellow form placed in chart (order not valid for inpatient use)    Current Medications (verified) Outpatient Encounter Medications as of 08/19/2020  Medication Sig  . acetaminophen (TYLENOL) 500 MG tablet Take 1,000 mg by mouth 3 (three) times daily as needed.   . ALPRAZolam (XANAX) 0.25 MG tablet Take 1 tablet (0.25 mg total) by mouth at bedtime.  .Marland Kitchenatorvastatin (LIPITOR) 10 MG tablet Take 5 mg by mouth daily.   .Marland KitchenbuPROPion (WELLBUTRIN SR) 150 MG 12 hr tablet Take 1 tablet (150 mg total) by mouth 2 (two) times daily after a meal.  . Calcium-Phosphorus-Vitamin D (CITRACAL +D3 PO) Take 2 capsules by mouth 2 (two) times a day. 6557m25mcg(1000u)  . Camphor-Menthol-Methyl Sal 3.07-31-08 % PTCH Place 1 patch onto the skin. Apply one to lower back daily for back pain  . docusate sodium (COLACE) 100 MG capsule Take 100 mg by mouth 2 (two) times daily.  . ferrous sulfate 325 (65 FE) MG EC tablet Take 325 mg by mouth every Monday, Wednesday, and Friday. With a meal  . Fiber POWD Take 15 mLs by mouth daily. Mix with 4-8 oz of water  . fish oil-omega-3 fatty acids 1000 MG capsule Take 1 g by mouth daily.  . Marland Kitchenabapentin (NEURONTIN) 100 MG capsule Take 100 mg by mouth 3 (three) times daily.  . hydrocortisone cream 1 %  Apply 1 application topically daily as needed for itching. PRN to urogenital itchy area X 10 days than PRN  . KLOR-CON M20 20 MEQ tablet TAKE 1 TABLET BY MOUTH TWICE A DAY  . levothyroxine (SYNTHROID, LEVOTHROID) 75 MCG tablet Take 1 tablet (75 mcg total) by mouth daily before breakfast.  . losartan (COZAAR) 50 MG tablet Take 50 mg by mouth daily.   . methocarbamol (ROBAXIN) 500 MG tablet Take 250 mg by mouth daily as needed. For muscle spasms  . nitrofurantoin (MACRODANTIN) 50 MG capsule Take 1 capsule (50 mg total) by mouth daily.  . nystatin  (MYCOSTATIN/NYSTOP) powder Apply 100,000 g topically 2 (two) times daily as needed.   . nystatin cream (MYCOSTATIN) Apply 1 application topically as needed for dry skin.  . omeprazole (PRILOSEC) 40 MG capsule Take 40 mg by mouth daily.  . polyethylene glycol (MIRALAX / GLYCOLAX) 17 g packet Take 17 g by mouth as directed. On Monday and Thursday  . sertraline (ZOLOFT) 25 MG tablet Take 75 mg by mouth daily. 3 tablets=75 mg daily  . torsemide (DEMADEX) 20 MG tablet Take 20 mg by mouth daily.  . traMADol (ULTRAM) 50 MG tablet Take 1 tablet (50 mg total) by mouth every 6 (six) hours as needed.   No facility-administered encounter medications on file as of 08/19/2020.    Allergies (verified) Patient has no known allergies.   History: Past Medical History:  Diagnosis Date  . Anemia   . Anxiety   . Chronotropic incompetence 12/2012    potentially medication related; noted on CPET   . DDD (degenerative disc disease)   . Eczema   . GERD (gastroesophageal reflux disease)   . H/O hiatal hernia   . Hx: UTI (urinary tract infection)   . Hyperlipidemia   . Hypertension   . Hypothyroidism   . Osteopenia   . Septicemia (HCC) 2002   following UTI  . Vertigo, benign positional    Past Surgical History:  Procedure Laterality Date  . BREAST SURGERY     left biopsy  . CATARACT EXTRACTION Right    2 weeks ago  . CPET / MET - PFTS     Consistent with chronotropic incompetence; See attached report in the results section  . DILATION AND CURETTAGE OF UTERUS     x 2  . DOPPLER ECHOCARDIOGRAPHY  01/10/2013   Normal LV size and function. Normal EF. Air sclerosis but no stenosis  . ESOPHAGOGASTRODUODENOSCOPY  05/04/2012   Procedure: ESOPHAGOGASTRODUODENOSCOPY (EGD);  Surgeon: Robert D Kaplan, MD;  Location: [facility];  Service: Endoscopy;  Laterality: N/A;  . EYE SURGERY     cataract extraction with ILO  ? eye  . IR KYPHO EA ADDL LEVEL THORACIC OR LUMBAR  10/28/2016  . IR KYPHO LUMBAR INC FX  REDUCE BONE BX UNI/BIL CANNULATION INC/IMAGING  10/28/2016  . IR KYPHO THORACIC WITH BONE BIOPSY  12/24/2016  . IR RADIOLOGIST EVAL & MGMT  11/11/2016  . KNEE ARTHROSCOPY  2011   right  . TONSILLECTOMY    . TOTAL KNEE ARTHROPLASTY  04/24/2012   Procedure: TOTAL KNEE ARTHROPLASTY;  Surgeon: Frank V Aluisio, MD;  Location: [facility];  Service: Orthopedics;  Laterality: Right;  . TRANSTHORACIC ECHOCARDIOGRAM  02/2018   Ordered by PCP for "congestive heart failure " -EF 60 M 65%.  GR 1 DD.  No R WMA--- > ESSENTIALLY NORMAL   Family History  Problem Relation Age of Onset  . Lung cancer Father 53         Died at age 54  . Stroke Mother 90       Died in her late 90s.  . Heart attack Mother 90  . Diabetes Mother   . Arthritis Mother    Social History   Socioeconomic History  . Marital status: Widowed    Spouse name: Harold  . Number of children: 3  . Years of education: Not on file  . Highest education level: Not on file  Occupational History  . Occupation: homemaker  Tobacco Use  . Smoking status: Former Smoker    Years: 0.00    Types: Cigarettes    Quit date: 05/03/1965    Years since quitting: 55.3  . Smokeless tobacco: Never Used  Vaping Use  . Vaping Use: Never used  Substance and Sexual Activity  . Alcohol use: No    Comment: occ glass wine  . Drug use: No  . Sexual activity: Not on file  Other Topics Concern  . Not on file  Social History Narrative   She is a widowed mother of 3.   She is a former smoker, with a distant history having quit in 1966.   She does drink an occasional glass of red wine.   She is just now started to try doing exercise with water aerobics.   Social Determinants of Health   Financial Resource Strain: Not on file  Food Insecurity: Not on file  Transportation Needs: Not on file  Physical Activity: Not on file  Stress: Not on file  Social Connections: Not on file    Tobacco Counseling Counseling given: Not Answered   Clinical  Intake:  Pre-visit preparation completed: Yes  Pain : No/denies pain     BMI - recorded: 30.83 Nutritional Status: BMI > 30  Obese Nutritional Risks: None Diabetes: No  How often do you need to have someone help you when you read instructions, pamphlets, or other written materials from your doctor or pharmacy?: 1 - Never What is the last grade level you completed in school?: 1 year college  Diabetic?no  Interpreter Needed?: No  Information entered by ::  Xie  NP   Activities of Daily Living In your present state of health, do you have any difficulty performing the following activities: 08/20/2020  Hearing? Y  Vision? N  Difficulty concentrating or making decisions? N  Walking or climbing stairs? Y  Dressing or bathing? Y  Doing errands, shopping? Y  Preparing Food and eating ? N  Using the Toilet? N  In the past six months, have you accidently leaked urine? Y  Do you have problems with loss of bowel control? N  aging your Medications? Y  aging your Finances? Y  Housekeeping or managing your Housekeeping? Y  Some recent data might be hidden    Patient Care Team: Gupta, Anjali L, MD as PCP - General (Internal Medicine) ,  X, NP as Nurse Practitioner (Internal Medicine)  Indicate any recent Medical Services you may have received from other than [facility] providers in the past year (date may be approximate).     Assessment:   This is a routine wellness examination for [name] [name].  Hearing/Vision screen No exam data present  Dietary issues and exercise activities discussed: Current Exercise Habits: The patient does not participate in regular exercise at present, Exercise limited by: cardiac condition(s);orthopedic condition(s);neurologic condition(s)  Goals    . Functional Decline Minimized     Evidence-based guidance:   Assess fall risk, including balance and gait impairment, muscle weakness, diminished   vision or hearing, environmental hazards,  effects of medication and dehydration.   Assess and review gait speed, decreased muscle strength or impaired functional mobility; consider use of validated tool if available.   Prepare patient for rehabilitation services to develop an individualized activity and exercise program as indicated, such as progressive resistance strengthening, balance and activity tolerance training or home exercise program.   Prevent falls with environmental adjustment; encourage compliance with exercise program, management of incontinence and adequate vitamin D and calcium intake from food or supplements.   Promote gradual increase in intensity of activity and exercise as tolerated, such as duration, frequency, exercise repetition and sets and intensity.   Provide guidance and interventions aimed at fall prevention.   Review or assess presence of reduced muscle mass or results of dual-energy x-ray absorptiometry.   Notes:       Depression Screen PHQ 2/9 Scores 05/01/2018 10/28/2017 09/14/2017 08/01/2017 06/29/2017  PHQ - 2 Score 6 1 4 6 2  PHQ- 9 Score 14 - 14 11 9    Fall Risk Fall Risk  10/18/2018 08/23/2018 05/09/2018 05/01/2018 04/10/2018  Falls in the past year? 0 0 Yes Yes No  Comment - - - fell in June/2019 [facility] (asst living) -  Number falls in past yr: 0 0 1 1 -  Injury with Fall? 0 0 No No -  Risk for fall due to : - - - Impaired balance/gait -  Follow up - - - Falls prevention discussed -    FALL RISK PREVENTION PERTAINING TO THE HOME:  Any stairs in or around the home? Yes  If so, are there any without handrails? No  Home free of loose throw rugs in walkways, pet beds, electrical cords, etc? Yes  Adequate lighting in your home to reduce risk of falls? Yes   ASSISTIVE DEVICES UTILIZED TO PREVENT FALLS:  Life alert? No  Use of a cane, walker or w/c? Yes  Grab bars in the bathroom? Yes  Shower chair or bench in shower? Yes  Elevated toilet seat or a handicapped toilet? Yes   TIMED UP  AND GO:  Was the test performed? Yes .  Length of time to ambulate 10 feet: 15 sec.   Gait slow and steady with assistive device  Cognitive Function: MMSE - Mini Mental State Exam 09/14/2017 06/29/2017  Not completed: (No Data) -  Orientation to time 5 5  Orientation to Place 5 5  Registration 3 3  Attention/ Calculation 5 5  Recall 3 3  Language- name 2 objects 2 2  Language- repeat 1 1  Language- follow 3 step command 3 3  Language- read & follow direction 1 1  Write a sentence 1 1  Copy design 1 1  Total score 30 30        Immunizations Immunization History  Administered Date(s) Administered  . Influenza, High Dose Seasonal PF 04/26/2016, 03/22/2017, 05/08/2019  . Influenza,inj,Quad PF,6+ Mos 04/27/2018  . Influenza-Unspecified 04/22/2011, 04/04/2012, 05/03/2013, 05/04/2013, 04/17/2014, 04/19/2015, 04/26/2016, 04/22/2017  . Moderna Sars-Covid-2 Vaccination 07/30/2019, 08/27/2019  . Pneumococcal Polysaccharide-23 08/26/2003, 09/06/2011, 05/15/2019  . Pneumococcal-Unspecified 12/20/2013  . Td 09/26/2006  . Tdap 05/15/2019  . Zoster 10/25/2007  . Zoster Recombinat (Shingrix) 11/21/2017, 04/08/2018    TDAP status: Up to date  Flu Vaccine status: Up to date  Pneumococcal vaccine status: Up to date  Covid-19 vaccine status: Completed vaccines  Qualifies for Shingles Vaccine? Yes   Zostavax completed Yes   Shingrix Completed?: Yes  Screening Tests   Health Maintenance  Topic Date Due  . COVID-19 Vaccine (3 - Moderna risk 4-dose series) 09/24/2019  . INFLUENZA VACCINE  02/24/2020  . PNA vac Low Risk Adult (2 of 2 - PCV13) 05/14/2020  . TETANUS/TDAP  05/14/2029  . DEXA SCAN  Completed    Health Maintenance  Health Maintenance Due  Topic Date Due  . COVID-19 Vaccine (3 - Moderna risk 4-dose series) 09/24/2019  . INFLUENZA VACCINE  02/24/2020  . PNA vac Low Risk Adult (2 of 2 - PCV13) 05/14/2020    Colorectal cancer screening: No longer required.    Mammogram status: No longer required due to age.  Bone Density status: Completed 2017. Results reflect: Bone density results: OSTEOPOROSIS. Repeat every prn  years.  Lung Cancer Screening: (Low Dose CT Chest recommended if Age 27-80 years, 30 pack-year currently smoking OR have quit w/in 15years.) does not qualify.   Lung Cancer Screening Referral: no  Additional Screening:  Hepatitis C Screening: does not qualify  Vision Screening: Recommended annual ophthalmology exams for early detection of glaucoma and other disorders of the eye. Is the patient up to date with their annual eye exam?  Yes  Who is the provider or what is the name of the office in which the patient attends annual eye exams? Dr. Gershon Crane If pt is not established with a provider, would they like to be referred to a provider to establish care? No .   Dental Screening: Recommended annual dental exams for proper oral hygiene  Community Resource Referral / Chronic Care Management: CRR required this visit?  No   CCM required this visit?  No      Plan:     I have personally reviewed and noted the following in the patient's chart:   . Medical and social history . Use of alcohol, tobacco or illicit drugs  . Current medications and supplements . Functional ability and status . Nutritional status . Physical activity . Advanced directives . List of other physicians . Hospitalizations, surgeries, and ER visits in previous 12 months . Vitals . Screenings to include cognitive, depression, and falls . Referrals and appointments  In addition, I have reviewed and discussed with patient certain preventive protocols, quality metrics, and best practice recommendations. A written personalized care plan for preventive services as well as general preventive health recommendations were provided to patient.     Bryden Darden X Xana Bradt, NP   08/20/2020

## 2020-08-30 DIAGNOSIS — M79651 Pain in right thigh: Secondary | ICD-10-CM | POA: Diagnosis not present

## 2020-09-01 ENCOUNTER — Non-Acute Institutional Stay: Payer: Medicare Other | Admitting: Nurse Practitioner

## 2020-09-01 ENCOUNTER — Encounter: Payer: Self-pay | Admitting: Nurse Practitioner

## 2020-09-01 DIAGNOSIS — H0100A Unspecified blepharitis right eye, upper and lower eyelids: Secondary | ICD-10-CM | POA: Diagnosis not present

## 2020-09-01 DIAGNOSIS — H0100B Unspecified blepharitis left eye, upper and lower eyelids: Secondary | ICD-10-CM

## 2020-09-01 DIAGNOSIS — K5909 Other constipation: Secondary | ICD-10-CM | POA: Diagnosis not present

## 2020-09-01 DIAGNOSIS — I739 Peripheral vascular disease, unspecified: Secondary | ICD-10-CM | POA: Diagnosis not present

## 2020-09-01 DIAGNOSIS — N3090 Cystitis, unspecified without hematuria: Secondary | ICD-10-CM | POA: Diagnosis not present

## 2020-09-01 DIAGNOSIS — E039 Hypothyroidism, unspecified: Secondary | ICD-10-CM | POA: Diagnosis not present

## 2020-09-01 DIAGNOSIS — I10 Essential (primary) hypertension: Secondary | ICD-10-CM

## 2020-09-01 DIAGNOSIS — G8929 Other chronic pain: Secondary | ICD-10-CM

## 2020-09-01 DIAGNOSIS — W19XXXA Unspecified fall, initial encounter: Secondary | ICD-10-CM

## 2020-09-01 DIAGNOSIS — M545 Low back pain, unspecified: Secondary | ICD-10-CM

## 2020-09-01 DIAGNOSIS — D638 Anemia in other chronic diseases classified elsewhere: Secondary | ICD-10-CM | POA: Diagnosis not present

## 2020-09-01 DIAGNOSIS — Z9181 History of falling: Secondary | ICD-10-CM | POA: Diagnosis not present

## 2020-09-01 DIAGNOSIS — M25551 Pain in right hip: Secondary | ICD-10-CM | POA: Diagnosis not present

## 2020-09-01 DIAGNOSIS — K219 Gastro-esophageal reflux disease without esophagitis: Secondary | ICD-10-CM

## 2020-09-01 DIAGNOSIS — M6281 Muscle weakness (generalized): Secondary | ICD-10-CM | POA: Diagnosis not present

## 2020-09-01 NOTE — Assessment & Plan Note (Signed)
No constipation while taking prn MiraLax, Fiber daily, Colace bid.

## 2020-09-01 NOTE — Assessment & Plan Note (Signed)
minimal, takes Torsemide 20mg  qd.Bun/creat 27/1.4 06/05/20

## 2020-09-01 NOTE — Progress Notes (Signed)
Location:    [facility] Room Number: 39 Place of Service:  ALF [phone]) Provider: Marlana Latus NP  Virgie Dad, MD  Patient Care Team: Virgie Dad, MD as PCP - General (Internal Medicine) Dannetta Lekas X, NP as Nurse Practitioner (Internal Medicine)  Extended Emergency Contact Information Primary Emergency Contact: Fare,[name] Address: [address]          [place], [place] [address] [place] Phone: [phone] Mobile Phone: [phone] Relation: Son Secondary Emergency Contact: Keil,[name]  [place] Phone: [phone] Relation: None  Code Status:  DNR Goals of care: Advanced Directive information Advanced Directives 04/08/2020  Does Patient Have a Medical Advance Directive? Yes  Type of Advance Directive Out of facility DNR (pink MOST or yellow form)  Does patient want to make changes to medical advance directive? No - Patient declined  Copy of [place] in Chart? -  Pre-existing out of facility DNR order (yellow form or pink MOST form) Yellow form placed in chart (order not valid for inpatient use)     Chief Complaint  Patient presents with  . Acute Visit    Fall    HPI:  Pt is a 87 y.o. female seen today for an acute visit for reported unwitnessed fall in shower 08/30/20 with stand by assist by a CNA, resulted in R hip pain, HPOA/patient declined ED evaluation, obtain X-ray R femur/pelvis showed no acute fracture, prn Tramadol/Tylenol available for pain.   Hx of recurrent UTI, currently on Nitrofurantoin 14m qd for UTI suppression therapy.  HTN, blood pressure is controlled on Losartan 557mqd.  Hemorrhoids, no injury. No constipation while taking prn MiraLax, Fiber daily, Colace bid. Anemia, takes Fe 3x/wk, Hgb 9.7 06/05/20 Lower back pain, takes Gabapentin 10079mid, prn Methocarbamol.   Hypothyroidism, takes Levothyroxine 59m61md, TSH4.69 06/05/20 GERD, takes Omeprazole 40mg21mEdema BLE, minimal, takes Torsemide 20mg 53mun/creat 27/1.4 06/05/20             Blepharitis, better, on Erythromycin ophthalmic oint     Past Medical History:  Diagnosis Date  . Anemia   . Anxiety   . Chronotropic incompetence 12/2012    potentially medication related; noted on CPET   . DDD (degenerative disc disease)   . Eczema   . GERD (gastroesophageal reflux disease)   . H/O hiatal hernia   . Hx: UTI (urinary tract infection)   . Hyperlipidemia   . Hypertension   . Hypothyroidism   . Osteopenia   . Septicemia (HCC) [facility]   following UTI  . Vertigo, benign positional    Past Surgical History:  Procedure Laterality Date  . BREAST SURGERY     left biopsy  . CATARACT EXTRACTION Right    2 weeks ago  . CPET / MET - PFTS     Consistent with chronotropic incompetence; See attached report in the results section  . DILATION AND CURETTAGE OF UTERUS     x 2  . DOPPLER ECHOCARDIOGRAPHY  01/10/2013   Normal LV size and function. Normal EF. Air sclerosis but no stenosis  . ESOPHAGOGASTRODUODENOSCOPY  05/04/2012   Procedure: ESOPHAGOGASTRODUODENOSCOPY (EGD);  Surgeon: RobertInda Castle [facility];  Service: Endoscopy;  Laterality: N/A;  . EYE SURGERY     cataract extraction with ILO  ? eye  . IR KYPHO EA ADDL LEVEL THORACIC OR LUMBAR  10/28/2016  . IR KYPHO LUMBAR INC FX REDUCE BONE BX UNI/BIL CANNULATION  INC/IMAGING  10/28/2016  . IR KYPHO THORACIC WITH BONE BIOPSY  12/24/2016  . IR RADIOLOGIST EVAL & MGMT  11/11/2016  . KNEE ARTHROSCOPY  2011   right  . TONSILLECTOMY    . TOTAL KNEE ARTHROPLASTY  04/24/2012   Procedure: TOTAL KNEE ARTHROPLASTY;  Surgeon: Gearlean Alf, MD;  Location: [facility];  Service: Orthopedics;  Laterality: Right;  . TRANSTHORACIC ECHOCARDIOGRAM  02/2018   Ordered by PCP for "congestive heart failure " -EF 60  M 65%.  GR 1 DD.  No R WMA--- > ESSENTIALLY NORMAL    No Known Allergies  Allergies as of 09/01/2020   No Known Allergies     Medication List       Accurate as of September 01, 2020 11:59 PM. If you have any questions, ask your nurse or doctor.        acetaminophen 500 MG tablet Commonly known as: TYLENOL Take 1,000 mg by mouth 3 (three) times daily as needed.   ALPRAZolam 0.25 MG tablet Commonly known as: XANAX Take 1 tablet (0.25 mg total) by mouth at bedtime.   atorvastatin 10 MG tablet Commonly known as: LIPITOR Take 5 mg by mouth daily.   buPROPion 150 MG 12 hr tablet Commonly known as: WELLBUTRIN SR Take 1 tablet (150 mg total) by mouth 2 (two) times daily after a meal.   Camphor-Menthol-Methyl Sal 3.07-31-08 % Ptch Place 1 patch onto the skin. Apply one to lower back daily for back pain   CITRACAL +D3 PO Take 2 capsules by mouth 2 (two) times a day. 685m-25mcg(1000u)   docusate sodium 100 MG capsule Commonly known as: COLACE Take 100 mg by mouth 2 (two) times daily.   ferrous sulfate 325 (65 FE) MG EC tablet Take 325 mg by mouth every Monday, Wednesday, and Friday. With a meal   Fiber Powd Take 15 mLs by mouth daily. Mix with 4-8 oz of water   fish oil-omega-3 fatty acids 1000 MG capsule Take 1 g by mouth daily.   gabapentin 100 MG capsule Commonly known as: NEURONTIN Take 100 mg by mouth 3 (three) times daily.   hydrocortisone cream 1 % Apply 1 application topically daily as needed for itching. PRN to urogenital itchy area X 10 days than PRN   Klor-Con M20 20 MEQ tablet Generic drug: potassium chloride SA TAKE 1 TABLET BY MOUTH TWICE A DAY   levothyroxine 75 MCG tablet Commonly known as: SYNTHROID Take 1 tablet (75 mcg total) by mouth daily before breakfast.   losartan 50 MG tablet Commonly known as: COZAAR Take 50 mg by mouth daily.   methocarbamol 500 MG tablet Commonly known as: ROBAXIN Take 250 mg by mouth daily as needed. For muscle  spasms   nitrofurantoin 50 MG capsule Commonly known as: MACRODANTIN Take 1 capsule (50 mg total) by mouth daily.   nystatin powder Commonly known as: MYCOSTATIN/NYSTOP Apply 100,000 g topically 2 (two) times daily as needed.   nystatin cream Commonly known as: MYCOSTATIN Apply 1 application topically as needed for dry skin.   omeprazole 40 MG capsule Commonly known as: PRILOSEC Take 40 mg by mouth daily.   polyethylene glycol 17 g packet Commonly known as: MIRALAX / GLYCOLAX Take 17 g by mouth as directed. On Monday and Thursday   sertraline 25 MG tablet Commonly known as: ZOLOFT Take 75 mg by mouth daily. 3 tablets=75 mg daily   torsemide 20 MG tablet Commonly known as: DEMADEX Take 20 mg by mouth daily.  traMADol 50 MG tablet Commonly known as: ULTRAM Take 1 tablet (50 mg total) by mouth every 6 (six) hours as needed.       Review of Systems  Constitutional: Negative for fatigue, fever and unexpected weight change.       Chronic fatigue.   HENT: Positive for hearing loss. Negative for congestion and voice change.   Eyes: Negative for visual disturbance.  Respiratory: Positive for shortness of breath. Negative for cough and wheezing.        DOE  Cardiovascular: Positive for leg swelling. Negative for chest pain and palpitations.  Gastrointestinal: Negative for abdominal pain and constipation.  Genitourinary: Negative for difficulty urinating, dysuria and urgency.       Urinary leakage.   Musculoskeletal: Positive for arthralgias, back pain and gait problem. Negative for joint swelling.       R hip pain  Skin: Negative for color change.  Neurological: Negative for tremors, speech difficulty, light-headedness and headaches.       Hx of vertigo, chronic  Psychiatric/Behavioral: Negative for behavioral problems and sleep disturbance. The patient is not nervous/anxious.     Immunization History  Administered Date(s) Administered  . Influenza, High Dose Seasonal  PF 04/26/2016, 03/22/2017, 05/08/2019  . Influenza,inj,Quad PF,6+ Mos 04/27/2018  . Influenza-Unspecified 04/22/2011, 04/04/2012, 05/03/2013, 05/04/2013, 04/17/2014, 04/19/2015, 04/26/2016, 04/22/2017  . Moderna Sars-Covid-2 Vaccination 07/30/2019, 08/27/2019  . Pneumococcal Polysaccharide-23 08/26/2003, 09/06/2011, 05/15/2019  . Pneumococcal-Unspecified 12/20/2013  . Td 09/26/2006  . Tdap 05/15/2019  . Zoster 10/25/2007  . Zoster Recombinat (Shingrix) 11/21/2017, 04/08/2018   Pertinent  Health Maintenance Due  Topic Date Due  . INFLUENZA VACCINE  02/24/2020  . PNA vac Low Risk Adult (2 of 2 - PCV13) 05/14/2020  . DEXA SCAN  Completed   Fall Risk  10/18/2018 08/23/2018 05/09/2018 05/01/2018 04/10/2018  Falls in the past year? 0 0 Yes Yes No  Comment - - - fell in June/2019 _0  Home (asst living) -  Number falls in past yr: 0 0 1 1 -  Injury with Fall? 0 0 No No -  Risk for fall due to : - - - Impaired balance/gait -  Follow up - - - Falls prevention discussed -   Functional Status Survey:    Vitals:   09/01/20 1030  BP: (!) 145/64  Pulse: 60  Resp: 20  Temp: (!) 96.7 F (35.9 C)  SpO2: 92%  Weight: 179 lb 9.6 oz (81.5 kg)  Height: _1  (1.626 m)   Body mass index is 30.83 kg/m. Physical Exam Vitals and nursing note reviewed.  Constitutional:      Appearance: Normal appearance.  HENT:     Head: Normocephalic and atraumatic.  Eyes:     Extraocular Movements: Extraocular movements intact.     Conjunctiva/sclera: Conjunctivae normal.     Pupils: Pupils are equal, round, and reactive to light.  Cardiovascular:     Rate and Rhythm: Normal rate and regular rhythm.     Heart sounds: No murmur heard.   Pulmonary:     Breath sounds: No wheezing.  Abdominal:     General: Bowel sounds are normal.     Palpations: Abdomen is soft.     Tenderness: There is no abdominal tenderness. There is no rebound.  Genitourinary:    Comments: External hemorrhoid per previous  examination.  Musculoskeletal:        General: Tenderness present.     Cervical back: Normal range of motion and neck supple.     Right  lower leg: Edema present.     Left lower leg: Edema present.     Comments: Edema trace BLE. R hip pain is better with weight bearing since fall 08/30/20  Skin:    General: Skin is warm and dry.  Neurological:     General: No focal deficit present.     Mental Status: She is alert. Mental status is at baseline.     Motor: No weakness.     Coordination: Coordination abnormal.     Gait: Gait abnormal.     Comments: Oriented to person, place.   Psychiatric:        Mood and Affect: Mood normal.        Behavior: Behavior normal.        Thought Content: Thought content normal.     Labs reviewed: Recent Labs    09/17/19 0000 04/10/20 0000 06/05/20 0000  NA 140 141 137  K 4.2 4.1 4.0  CL 105 104 100  CO2 28* 30* 29*  BUN 29* 31* 27*  CREATININE 1.4* 1.3* 1.4*  CALCIUM 9.8 9.7 9.6   Recent Labs    09/17/19 0000 04/10/20 0000 06/05/20 0000  AST 12* 14 20  ALT 4* 8 14  ALKPHOS 104 85 94  ALBUMIN 4.0 4.0 3.9   Recent Labs    10/01/19 0000 11/26/19 0000 04/10/20 0000 06/05/20 0000  WBC 2.7 4.1 3.4 4.4  NEUTROABS 1,204  --  1,510.00 2,213.00  HGB 8.2* 8.7* 10.0* 9.7*  HCT 27* 28* 32* 31*  PLT 76* 73* 92* 77*   Lab Results  Component Value Date   TSH 4.69 06/05/2020   No results found for: HGBA1C Lab Results  Component Value Date   CHOL 154 04/10/2020   HDL 47 04/10/2020   LDLCALC 87 04/10/2020   TRIG 108 04/10/2020   CHOLHDL 3.8 11/23/2018    Significant Diagnostic Results in last 30 days:  No results found.  Assessment/Plan Right hip pain unwitnessed fall in shower 08/30/20 with stand by assist by a CNA, resulted in R hip pain, HPOA/patient declined ED evaluation, obtain X-ray R femur/pelvis showed no acute fracture, prn Tramadol/Tylenol available for pain. May repeat X-ray if no better.    Fall unwitnessed fall in  shower 08/30/20 with stand by assist by a CNA, resulted in R hip pain, HPOA/patient declined ED evaluation, close supervision/assistance for safety.    Recurrent cystitis Hx of recurrent UTI, currently on Nitrofurantoin 6m qd for UTI suppression therapy.    Essential hypertension HTN, blood pressure is controlled on Losartan 535mqd.   Chronic constipation No constipation while taking prn MiraLax, Fiber daily, Colace bid.   Anemia of chronic disease takes Fe 3x/wk, Hgb 9.7 06/05/20   Chronic back pain takes Gabapentin 10065mid, prn Methocarbamol.    Acquired hypothyroidism takes Levothyroxine 76m68md, TSH4.69 06/05/20   GERD (gastroesophageal reflux disease)  takes Omeprazole 40mg34m PVD (peripheral vascular disease) (HCC)  minimal, takes Torsemide 20mg 53mun/creat 27/1.4 06/05/20   Blepharitis of both eyes  better, on Erythromycin ophthalmic oint      Family/ staff Communication: plan of care reviewed with the patient and charge nurse.   Labs/tests ordered:  none  Time spend 40 minutes.

## 2020-09-01 NOTE — Assessment & Plan Note (Signed)
HTN, blood pressure is controlled on Losartan 50mg  qd.

## 2020-09-01 NOTE — Assessment & Plan Note (Signed)
takes Fe 3x/wk, Hgb 9.7 06/05/20

## 2020-09-01 NOTE — Assessment & Plan Note (Signed)
unwitnessed fall in shower 08/30/20 with stand by assist by a CNA, resulted in R hip pain, HPOA/patient declined ED evaluation, obtain X-ray R femur/pelvis showed no acute fracture, prn Tramadol/Tylenol available for pain. May repeat X-ray if no better.

## 2020-09-01 NOTE — Assessment & Plan Note (Signed)
takes Gabapentin 100mg  tid, prn Methocarbamol.

## 2020-09-01 NOTE — Assessment & Plan Note (Signed)
takes Levothyroxine qd, TSH4.69 06/05/20

## 2020-09-01 NOTE — Assessment & Plan Note (Signed)
Hx of recurrent UTI, currently on Nitrofurantoin 50mg  qd for UTI suppression therapy.

## 2020-09-01 NOTE — Assessment & Plan Note (Signed)
unwitnessed fall in shower 08/30/20 with stand by assist by a CNA, resulted in R hip pain, HPOA/patient declined ED evaluation, close supervision/assistance for safety.

## 2020-09-01 NOTE — Assessment & Plan Note (Signed)
takes Omeprazole 40mg  qd

## 2020-09-01 NOTE — Assessment & Plan Note (Signed)
better, on Erythromycin ophthalmic oint

## 2020-09-02 ENCOUNTER — Encounter: Payer: Self-pay | Admitting: Nurse Practitioner

## 2020-09-03 DIAGNOSIS — Z9181 History of falling: Secondary | ICD-10-CM | POA: Diagnosis not present

## 2020-09-03 DIAGNOSIS — M6281 Muscle weakness (generalized): Secondary | ICD-10-CM | POA: Diagnosis not present

## 2020-09-09 DIAGNOSIS — M6281 Muscle weakness (generalized): Secondary | ICD-10-CM | POA: Diagnosis not present

## 2020-09-09 DIAGNOSIS — Z9181 History of falling: Secondary | ICD-10-CM | POA: Diagnosis not present

## 2020-09-12 DIAGNOSIS — M6281 Muscle weakness (generalized): Secondary | ICD-10-CM | POA: Diagnosis not present

## 2020-09-12 DIAGNOSIS — Z9181 History of falling: Secondary | ICD-10-CM | POA: Diagnosis not present

## 2020-09-19 DIAGNOSIS — Z961 Presence of intraocular lens: Secondary | ICD-10-CM | POA: Diagnosis not present

## 2020-09-19 DIAGNOSIS — M6281 Muscle weakness (generalized): Secondary | ICD-10-CM | POA: Diagnosis not present

## 2020-09-19 DIAGNOSIS — H524 Presbyopia: Secondary | ICD-10-CM | POA: Diagnosis not present

## 2020-09-19 DIAGNOSIS — Z9181 History of falling: Secondary | ICD-10-CM | POA: Diagnosis not present

## 2020-09-24 DIAGNOSIS — M6281 Muscle weakness (generalized): Secondary | ICD-10-CM | POA: Diagnosis not present

## 2020-09-24 DIAGNOSIS — R2681 Unsteadiness on feet: Secondary | ICD-10-CM | POA: Diagnosis not present

## 2020-09-24 DIAGNOSIS — Z9181 History of falling: Secondary | ICD-10-CM | POA: Diagnosis not present

## 2020-10-01 DIAGNOSIS — M6281 Muscle weakness (generalized): Secondary | ICD-10-CM | POA: Diagnosis not present

## 2020-10-01 DIAGNOSIS — Z9181 History of falling: Secondary | ICD-10-CM | POA: Diagnosis not present

## 2020-10-01 DIAGNOSIS — R2681 Unsteadiness on feet: Secondary | ICD-10-CM | POA: Diagnosis not present

## 2020-10-03 DIAGNOSIS — R2681 Unsteadiness on feet: Secondary | ICD-10-CM | POA: Diagnosis not present

## 2020-10-03 DIAGNOSIS — M6281 Muscle weakness (generalized): Secondary | ICD-10-CM | POA: Diagnosis not present

## 2020-10-03 DIAGNOSIS — Z9181 History of falling: Secondary | ICD-10-CM | POA: Diagnosis not present

## 2020-10-06 DIAGNOSIS — Z9181 History of falling: Secondary | ICD-10-CM | POA: Diagnosis not present

## 2020-10-06 DIAGNOSIS — M6281 Muscle weakness (generalized): Secondary | ICD-10-CM | POA: Diagnosis not present

## 2020-10-06 DIAGNOSIS — R2681 Unsteadiness on feet: Secondary | ICD-10-CM | POA: Diagnosis not present

## 2020-10-07 DIAGNOSIS — R2681 Unsteadiness on feet: Secondary | ICD-10-CM | POA: Diagnosis not present

## 2020-10-07 DIAGNOSIS — M6281 Muscle weakness (generalized): Secondary | ICD-10-CM | POA: Diagnosis not present

## 2020-10-07 DIAGNOSIS — Z9181 History of falling: Secondary | ICD-10-CM | POA: Diagnosis not present

## 2020-10-10 DIAGNOSIS — M6281 Muscle weakness (generalized): Secondary | ICD-10-CM | POA: Diagnosis not present

## 2020-10-10 DIAGNOSIS — Z9181 History of falling: Secondary | ICD-10-CM | POA: Diagnosis not present

## 2020-10-10 DIAGNOSIS — R2681 Unsteadiness on feet: Secondary | ICD-10-CM | POA: Diagnosis not present

## 2020-10-13 DIAGNOSIS — R2681 Unsteadiness on feet: Secondary | ICD-10-CM | POA: Diagnosis not present

## 2020-10-13 DIAGNOSIS — Z9181 History of falling: Secondary | ICD-10-CM | POA: Diagnosis not present

## 2020-10-13 DIAGNOSIS — M6281 Muscle weakness (generalized): Secondary | ICD-10-CM | POA: Diagnosis not present

## 2020-10-14 DIAGNOSIS — R2681 Unsteadiness on feet: Secondary | ICD-10-CM | POA: Diagnosis not present

## 2020-10-14 DIAGNOSIS — M6281 Muscle weakness (generalized): Secondary | ICD-10-CM | POA: Diagnosis not present

## 2020-10-14 DIAGNOSIS — Z9181 History of falling: Secondary | ICD-10-CM | POA: Diagnosis not present

## 2020-10-16 ENCOUNTER — Encounter (HOSPITAL_COMMUNITY): Payer: Self-pay

## 2020-10-16 ENCOUNTER — Other Ambulatory Visit: Payer: Self-pay

## 2020-10-16 ENCOUNTER — Emergency Department (HOSPITAL_COMMUNITY): Payer: Medicare Other

## 2020-10-16 ENCOUNTER — Emergency Department (HOSPITAL_COMMUNITY)
Admission: EM | Admit: 2020-10-16 | Discharge: 2020-10-16 | Disposition: A | Discharge status: Home / Self Care | Payer: Medicare Other | Attending: Emergency Medicine | Admitting: Emergency Medicine

## 2020-10-16 ENCOUNTER — Non-Acute Institutional Stay: Payer: Medicare Other | Admitting: Internal Medicine

## 2020-10-16 DIAGNOSIS — M545 Low back pain, unspecified: Secondary | ICD-10-CM

## 2020-10-16 DIAGNOSIS — M549 Dorsalgia, unspecified: Secondary | ICD-10-CM | POA: Diagnosis not present

## 2020-10-16 DIAGNOSIS — Z96651 Presence of right artificial knee joint: Secondary | ICD-10-CM | POA: Insufficient documentation

## 2020-10-16 DIAGNOSIS — E039 Hypothyroidism, unspecified: Secondary | ICD-10-CM | POA: Diagnosis not present

## 2020-10-16 DIAGNOSIS — M5459 Other low back pain: Secondary | ICD-10-CM | POA: Diagnosis not present

## 2020-10-16 DIAGNOSIS — R52 Pain, unspecified: Secondary | ICD-10-CM | POA: Diagnosis not present

## 2020-10-16 DIAGNOSIS — G8929 Other chronic pain: Secondary | ICD-10-CM

## 2020-10-16 DIAGNOSIS — Z79899 Other long term (current) drug therapy: Secondary | ICD-10-CM | POA: Insufficient documentation

## 2020-10-16 DIAGNOSIS — I10 Essential (primary) hypertension: Secondary | ICD-10-CM

## 2020-10-16 DIAGNOSIS — M6281 Muscle weakness (generalized): Secondary | ICD-10-CM | POA: Diagnosis not present

## 2020-10-16 DIAGNOSIS — N3090 Cystitis, unspecified without hematuria: Secondary | ICD-10-CM

## 2020-10-16 DIAGNOSIS — N183 Chronic kidney disease, stage 3 unspecified: Secondary | ICD-10-CM | POA: Insufficient documentation

## 2020-10-16 DIAGNOSIS — Z87891 Personal history of nicotine dependence: Secondary | ICD-10-CM | POA: Insufficient documentation

## 2020-10-16 DIAGNOSIS — E782 Mixed hyperlipidemia: Secondary | ICD-10-CM

## 2020-10-16 DIAGNOSIS — I129 Hypertensive chronic kidney disease with stage 1 through stage 4 chronic kidney disease, or unspecified chronic kidney disease: Secondary | ICD-10-CM | POA: Insufficient documentation

## 2020-10-16 DIAGNOSIS — F339 Major depressive disorder, recurrent, unspecified: Secondary | ICD-10-CM

## 2020-10-16 DIAGNOSIS — M546 Pain in thoracic spine: Secondary | ICD-10-CM | POA: Diagnosis not present

## 2020-10-16 DIAGNOSIS — R279 Unspecified lack of coordination: Secondary | ICD-10-CM | POA: Diagnosis not present

## 2020-10-16 DIAGNOSIS — R6889 Other general symptoms and signs: Secondary | ICD-10-CM | POA: Diagnosis not present

## 2020-10-16 DIAGNOSIS — R2681 Unsteadiness on feet: Secondary | ICD-10-CM | POA: Diagnosis not present

## 2020-10-16 DIAGNOSIS — D61818 Other pancytopenia: Secondary | ICD-10-CM

## 2020-10-16 DIAGNOSIS — W19XXXA Unspecified fall, initial encounter: Secondary | ICD-10-CM | POA: Insufficient documentation

## 2020-10-16 DIAGNOSIS — Z9181 History of falling: Secondary | ICD-10-CM | POA: Diagnosis not present

## 2020-10-16 DIAGNOSIS — Z743 Need for continuous supervision: Secondary | ICD-10-CM | POA: Diagnosis not present

## 2020-10-16 DIAGNOSIS — M160 Bilateral primary osteoarthritis of hip: Secondary | ICD-10-CM | POA: Diagnosis not present

## 2020-10-16 LAB — CBC
HCT: 33.7 % — ABNORMAL LOW (ref 36.0–46.0)
Hemoglobin: 10.1 g/dL — ABNORMAL LOW (ref 12.0–15.0)
MCH: 26.8 pg (ref 26.0–34.0)
MCHC: 30 g/dL (ref 30.0–36.0)
MCV: 89.4 fL (ref 80.0–100.0)
Platelets: 97 10*3/uL — ABNORMAL LOW (ref 150–400)
RBC: 3.77 MIL/uL — ABNORMAL LOW (ref 3.87–5.11)
RDW: 18.6 % — ABNORMAL HIGH (ref 11.5–15.5)
WBC: 8.5 10*3/uL (ref 4.0–10.5)
nRBC: 0 % (ref 0.0–0.2)

## 2020-10-16 LAB — BASIC METABOLIC PANEL
Anion gap: 8 (ref 5–15)
BUN: 35 mg/dL — ABNORMAL HIGH (ref 8–23)
CO2: 26 mmol/L (ref 22–32)
Calcium: 9.7 mg/dL (ref 8.9–10.3)
Chloride: 105 mmol/L (ref 98–111)
Creatinine, Ser: 1.38 mg/dL — ABNORMAL HIGH (ref 0.44–1.00)
GFR, Estimated: 37 mL/min — ABNORMAL LOW (ref >60)
Glucose, Bld: 111 mg/dL — ABNORMAL HIGH (ref 70–99)
Potassium: 4.2 mmol/L (ref 3.5–5.1)
Sodium: 139 mmol/L (ref 135–145)

## 2020-10-16 MED ORDER — MORPHINE SULFATE (PF) 2 MG/ML IV SOLN: 2.0000 mg | INTRAVENOUS | Status: DC | PRN

## 2020-10-16 MED ORDER — HYDROCODONE-ACETAMINOPHEN 5-325 MG PO TABS
1.0000 | ORAL_TABLET | Freq: Once | ORAL | Status: AC
  Administered 2020-10-16: 1 via ORAL
  Filled 2020-10-16: qty 1

## 2020-10-16 MED ORDER — LIDOCAINE 5 % EX PTCH
1.0000 | MEDICATED_PATCH | CUTANEOUS | Status: DC
  Administered 2020-10-16: 1 via TRANSDERMAL
  Filled 2020-10-16: qty 1

## 2020-10-16 MED ORDER — LIDOCAINE 4 % EX PTCH: 1.0000 | MEDICATED_PATCH | CUTANEOUS | 0 refills | Status: DC

## 2020-10-16 NOTE — ED Notes (Signed)
Son, of this patient would like an update from the nurse, Ahjanae Cassel, [phone].

## 2020-10-16 NOTE — ED Notes (Signed)
[facility]  [facility] RN states will be able to provide some addition assist to [name] in her assist living residence.

## 2020-10-16 NOTE — ED Provider Notes (Signed)
[facility] DEPT Provider Note   CSN: [id number] Arrival date & time: 10/16/20  1643     History Chief Complaint  Patient presents with  . Fall    Lower back pain     [name] [name] is a 87 y.o. female.  HPI   Patient lives at an assisted living facility.  She has some issues with her balance and has to use a walker.  Patient states she was using her walker when she was attempting to get change.  She ended up losing her balance and falling.  Patient fell hurting her lower back.  She was unable to get up on her own and states she was lying on the floor for a few hours before she got assistance.  Patient is complaining of pain in her lower back.  She denies any headache or head injury.  She did not lose consciousness.  She is not having any neck pain.  No chest pain or shortness of breath.  No focal numbness or weakness.  Past Medical History:  Diagnosis Date  . Anemia   . Anxiety   . Chronotropic incompetence 12/2012    potentially medication related; noted on CPET   . DDD (degenerative disc disease)   . Eczema   . GERD (gastroesophageal reflux disease)   . H/O hiatal hernia   . Hx: UTI (urinary tract infection)   . Hyperlipidemia   . Hypertension   . Hypothyroidism   . Osteopenia   . Septicemia ([place]) 2002   following UTI  . Vertigo, benign positional     Patient Active Problem List   Diagnosis Date Noted  . Right hip pain 09/01/2020  . Blepharitis of both eyes 06/10/2020  . Urogenital candidiasis 06/03/2020  . Perineal itching, female 02/11/2020  . External hemorrhoids 11/05/2019  . Rash 10/23/2019  . Intermittent left lower quadrant abdominal pain 10/23/2019  . Tremor of both hands 09/14/2019  . CKD (chronic kidney disease) stage 3, GFR 30-59 ml/min ([place]) 05/11/2019  . Fall 01/14/2018  . PVD (peripheral vascular disease) ([place]) 12/21/2017  . Chronic bilateral low back pain without sciatica 12/21/2017  . Depression, recurrent ([place])  08/01/2017  . Pancytopenia ([place]) 06/29/2017  . Depression, major, single episode, moderate ([place]) 06/29/2017  . Osteoporosis 05/25/2017  . Acquired hypothyroidism 05/25/2017  . B12 deficiency 05/25/2017  . Recurrent cystitis 05/25/2017  . Chronic back pain 05/25/2017  . GERD (gastroesophageal reflux disease) 12/22/2016  . Depression with anxiety 12/22/2016  . Chronic constipation 10/26/2016  . Compression fracture of L2 lumbar vertebra, closed, initial encounter ([place]) 10/21/2016  . Thrombocytopenia ([place]) 10/21/2016  . Hypertensive heart disease 05/02/2016  . Bilateral leg edema 04/30/2016  . Exertional dyspnea 09/29/2013  . Essential hypertension 01/29/2013  . Fatigue with decreased exercise tolerance 12/25/2012  . Chronotropic incompetence 12/24/2012  . Aortic ejection murmur 12/22/2012  . Esophagitis 05/04/2012  . GI bleed 05/03/2012  . Anemia of chronic disease 05/03/2012  . Postop Transfusion 04/27/2012  . OA (osteoarthritis) of knee 04/24/2012    Past Surgical History:  Procedure Laterality Date  . BREAST SURGERY     left biopsy  . CATARACT EXTRACTION Right    2 weeks ago  . CPET / MET - PFTS     Consistent with chronotropic incompetence; See attached report in the results section  . DILATION AND CURETTAGE OF UTERUS     x 2  . DOPPLER ECHOCARDIOGRAPHY  01/10/2013   Normal LV size and function. Normal EF. Pharmacist, community  sclerosis but no stenosis  . ESOPHAGOGASTRODUODENOSCOPY  05/04/2012   Procedure: ESOPHAGOGASTRODUODENOSCOPY (EGD);  Surgeon: Inda Castle, MD;  Location: [facility];  Service: Endoscopy;  Laterality: N/A;  . EYE SURGERY     cataract extraction with ILO  ? eye  . IR KYPHO EA ADDL LEVEL THORACIC OR LUMBAR  10/28/2016  . IR KYPHO LUMBAR INC FX REDUCE BONE BX UNI/BIL CANNULATION INC/IMAGING  10/28/2016  . IR KYPHO THORACIC WITH BONE BIOPSY  12/24/2016  . IR RADIOLOGIST EVAL & MGMT  11/11/2016  . KNEE ARTHROSCOPY  2011   right  . TONSILLECTOMY    . TOTAL KNEE  ARTHROPLASTY  04/24/2012   Procedure: TOTAL KNEE ARTHROPLASTY;  Surgeon: Gearlean Alf, MD;  Location: [facility];  Service: Orthopedics;  Laterality: Right;  . TRANSTHORACIC ECHOCARDIOGRAM  02/2018   Ordered by PCP for "congestive heart failure " -EF 60 M 65%.  GR 1 DD.  No R WMA--- > ESSENTIALLY NORMAL     OB History   No obstetric history on file.     Family History  Problem Relation Age of Onset  . Lung cancer Father 64       Died at age 89  . Stroke Mother 70       Died in her late 57s.  Marland Kitchen Heart attack Mother 49  . Diabetes Mother   . Arthritis Mother     Social History   Tobacco Use  . Smoking status: Former Smoker    Years: 0.00    Types: Cigarettes    Quit date: 05/03/1965    Years since quitting: 55.4  . Smokeless tobacco: Never Used  Vaping Use  . Vaping Use: Never used  Substance Use Topics  . Alcohol use: No    Comment: occ glass wine  . Drug use: No    Home Medications Prior to Admission medications   Medication Sig Start Date End Date Taking? Authorizing Provider  Lidocaine (HM LIDOCAINE PATCH) 4 % PTCH Apply 1 patch topically daily. 10/16/20  Yes Dorie Rank, MD  acetaminophen (TYLENOL) 500 MG tablet Take 1,000 mg by mouth 3 (three) times daily as needed.     [provider]  ALPRAZolam Duanne Moron) 0.25 MG tablet Take 1 tablet (0.25 mg total) by mouth at bedtime. 04/01/20   Virgie Dad, MD  atorvastatin (LIPITOR) 10 MG tablet Take 5 mg by mouth daily.     [provider]  buPROPion (WELLBUTRIN SR) 150 MG 12 hr tablet Take 1 tablet (150 mg total) by mouth 2 (two) times daily after a meal. 04/10/18   Blanchie Serve, MD  Calcium-Phosphorus-Vitamin D (CITRACAL +D3 PO) Take 2 capsules by mouth 2 (two) times a day. $Remo'650mg'sZKPZ$ -27mcg(1000u)    [provider]  Camphor-Menthol-Methyl Sal 3.07-31-08 % PTCH Place 1 patch onto the skin. Apply one to lower back daily for back pain    [provider]  docusate sodium (COLACE) 100 MG capsule Take  100 mg by mouth 2 (two) times daily.    [provider]  ferrous sulfate 325 (65 FE) MG EC tablet Take 325 mg by mouth every Monday, Wednesday, and Friday. With a meal    [provider]  Fiber POWD Take 15 mLs by mouth daily. Mix with 4-8 oz of water    [provider]  fish oil-omega-3 fatty acids 1000 MG capsule Take 1 g by mouth daily.    [provider]  gabapentin (NEURONTIN) 100 MG capsule Take 100 mg by  mouth 3 (three) times daily.    [provider]  hydrocortisone cream 1 % Apply 1 application topically daily as needed for itching. PRN to urogenital itchy area X 10 days than PRN    [provider]  KLOR-CON M20 20 MEQ tablet TAKE 1 TABLET BY MOUTH TWICE A DAY 11/17/18   Virgie Dad, MD  levothyroxine (SYNTHROID, LEVOTHROID) 75 MCG tablet Take 1 tablet (75 mcg total) by mouth daily before breakfast. 05/30/18   Ngetich, [name] C, NP  losartan (COZAAR) 50 MG tablet Take 50 mg by mouth daily.     [provider]  methocarbamol (ROBAXIN) 500 MG tablet Take 250 mg by mouth daily as needed. For muscle spasms    [provider]  nitrofurantoin (MACRODANTIN) 50 MG capsule Take 1 capsule (50 mg total) by mouth daily. 08/27/18   Virgie Dad, MD  nystatin (MYCOSTATIN/NYSTOP) powder Apply 100,000 g topically 2 (two) times daily as needed.     [provider]  nystatin cream (MYCOSTATIN) Apply 1 application topically as needed for dry skin.    [provider]  omeprazole (PRILOSEC) 40 MG capsule Take 40 mg by mouth daily.    [provider]  polyethylene glycol (MIRALAX / GLYCOLAX) 17 g packet Take 17 g by mouth as directed. On Monday and Thursday    [provider]  sertraline (ZOLOFT) 25 MG tablet Take 75 mg by mouth daily. 3 tablets=75 mg daily    [provider]  torsemide (DEMADEX) 20 MG tablet Take 20 mg by mouth daily.    [provider]  traMADol (ULTRAM) 50 MG tablet  Take 1 tablet (50 mg total) by mouth every 6 (six) hours as needed. 07/28/20   Virgie Dad, MD    Allergies    Patient has no known allergies.  Review of Systems   Review of Systems  All other systems reviewed and are negative.   Physical Exam Updated Vital Signs BP (!) 175/57 (BP Location: Right Arm)   Pulse 68   Temp 98.4 F (36.9 C) (Oral)   Resp 18   Ht 1.651 m (_0 )   Wt 77.1 kg   SpO2 (!) 87%   BMI 28.29 kg/m   Physical Exam Vitals and nursing note reviewed.  Constitutional:      General: She is not in acute distress.    Appearance: She is well-developed. She is not diaphoretic.  HENT:     Head: Normocephalic and atraumatic.     Right Ear: External ear normal.     Left Ear: External ear normal.  Eyes:     General: No scleral icterus.       Right eye: No discharge.        Left eye: No discharge.     Conjunctiva/sclera: Conjunctivae normal.  Neck:     Trachea: No tracheal deviation.  Cardiovascular:     Rate and Rhythm: Normal rate and regular rhythm.  Pulmonary:     Effort: Pulmonary effort is normal. No respiratory distress.     Breath sounds: Normal breath sounds. No stridor. No wheezing or rales.  Abdominal:     General: Bowel sounds are normal. There is no distension.     Palpations: Abdomen is soft.     Tenderness: There is no abdominal tenderness. There is no guarding or rebound.  Musculoskeletal:        General: No tenderness.     Cervical back: Neck supple.  Comments: No tenderness palpation bilateral hips, no tenderness into the upper extremities, tenderness palpation proximal lumbar and distal thoracic spine, no cervical spine tenderness  Skin:    General: Skin is warm and dry.     Findings: No rash.  Neurological:     Mental Status: She is alert and oriented to person, place, and time.     Cranial Nerves: No cranial nerve deficit (no facial droop, extraocular movements intact, no slurred speech).     Sensory: No sensory deficit.      Motor: No abnormal muscle tone or seizure activity.     Coordination: Coordination normal.     Comments: No pronator drift bilateral upper extrem, able to hold both legs off bed for 5 seconds, sensation intact in all extremities,no left or right sided neglect,      ED Results / Procedures / Treatments   Labs (all labs ordered are listed, but only abnormal results are displayed) Labs Reviewed  CBC - Abnormal; Notable for the following components:      Result Value   RBC 3.77 (*)    Hemoglobin 10.1 (*)    HCT 33.7 (*)    RDW 18.6 (*)    Platelets 97 (*)    All other components within normal limits  BASIC METABOLIC PANEL - Abnormal; Notable for the following components:   Glucose, Bld 111 (*)    BUN 35 (*)    Creatinine, Ser 1.38 (*)    GFR, Estimated 37 (*)    All other components within normal limits    EKG EKG Interpretation  Date/Time:  Thursday October 16 2020 17:55:18 EDT Ventricular Rate:  72 PR Interval:  208 QRS Duration: 92 QT Interval:  402 QTC Calculation: 440 R Axis:   39 Text Interpretation: Normal sinus rhythm Low voltage QRS Borderline ECG No significant change since last tracing Confirmed by Dorie Rank [phone]) on 10/16/2020 5:58:06 PM   Radiology DG Thoracic Spine 2 View  Result Date: 10/16/2020 CLINICAL DATA:  Fall, back pain EXAM: THORACIC SPINE 2 VIEWS COMPARISON:  CT 01/14/2018 FINDINGS: There is normal thoracic kyphosis. Vertebroplasty of T9, T11, L1, and L2 have been performed with stable compression deformities. No acute fracture or listhesis of the a thoracic spine. Remaining vertebral body height and intervertebral disc height has been preserved. The paraspinal soft tissues are unremarkable. IMPRESSION: No acute fracture or listhesis of the thoracic spine. Electronically Signed   By: Fidela Salisbury MD   On: 10/16/2020 18:05   DG Lumbar Spine Complete  Result Date: 10/16/2020 CLINICAL DATA:  Fall, back pain EXAM: LUMBAR SPINE - COMPLETE 4+ VIEW  COMPARISON:  MRI 12/23/2016 FINDINGS: Mild lumbar dextroscoliosis, apex right at L1 is again identified. T11, L1, and L2 vertebroplasty has been performed. Stable compression deformity. No acute fracture or listhesis of the lumbar spine. Remaining vertebral body height has been preserved. There is eccentric intervertebral disc space narrowing and endplate remodeling at Y4-0 in keeping with changes of moderate degenerative disc disease at this level. The paraspinal soft tissues are unremarkable. IMPRESSION: No acute fracture or listhesis. Electronically Signed   By: Fidela Salisbury MD   On: 10/16/2020 18:03   DG Hips Bilat W or Wo Pelvis 3-4 Views  Result Date: 10/16/2020 CLINICAL DATA:  Fall, bilateral hip pain EXAM: DG HIP (WITH OR WITHOUT PELVIS) 3-4V BILAT COMPARISON:  None. FINDINGS: Normal alignment. No fracture or dislocation. Mild bilateral degenerative hip arthritis. Soft tissues are unremarkable. IMPRESSION: Degenerative change.  No acute fracture or  dislocation. Electronically Signed   By: Fidela Salisbury MD   On: 10/16/2020 18:06    Procedures Procedures   Medications Ordered in ED Medications  lidocaine (LIDODERM) 5 % 1 patch (has no administration in time range)  HYDROcodone-acetaminophen (NORCO/VICODIN) 5-325 MG per tablet 1 tablet (1 tablet Oral Given 10/16/20 2024)    ED Course  I have reviewed the triage vital signs and the nursing notes.  Pertinent labs & imaging results that were available during my care of the patient were reviewed by me and considered in my medical decision making (see chart for details).  Clinical Course as of 10/16/20 2156  Thu Oct 16, 2020  1823 X-rays without signs of acute fracture [JK]  1912 Labs reviewed. [JK]  1913 Creatinine is stable compared to previous values. [JK]  1913 Hemoglobin is stable compared to previous values. [JK]  1913 Thrombocytopenia also noted on prior previous values [JK]  1949 Pain meds previously ordered have not been given.   Will try an oral dose at this point. [JK]  2044 Patient was able to ambulate but she did require assistance.  She had not taken her pain medication yet.  We will give her pain medication and retry.  Patient states they do have more assistance at her assistant living facility.  We will call facility double check. [JK]    Clinical Course User Index [JK] Dorie Rank, MD   MDM Rules/Calculators/A&P                          Patient presented to the ED for evaluation of back pain.  Patient's x-rays do not show any signs of acute fracture.  Laboratory tests are reassuring.  Patient was given pain medications.  She had some difficulty ambulating but was able to do so with assistance.  Pain has improved with treatment in the ED.  Patient is in assisted living facility.  We will attempt to call the home to make sure they are aware that the patient will be returning and to make sure they are assisting her after facility. Final Clinical Impression(s) / ED Diagnoses Final diagnoses:  Acute low back pain without sciatica, unspecified back pain laterality    Rx / DC Orders ED Discharge Orders         Ordered    Lidocaine (HM LIDOCAINE PATCH) 4 % PTCH  Every 24 hours        10/16/20 2155           Dorie Rank, MD 10/16/20 2159

## 2020-10-16 NOTE — ED Notes (Signed)
Phone calls x3 attempted to [facility] at [phone] with no answer x3 will wait 30 min and attempt call again.

## 2020-10-16 NOTE — Progress Notes (Signed)
Location:  [facility] Number: 39 Place of Service:  [facility] [phone])  Provider: Veleta Miners MD  Code Status: DNR Managed Care Goals of Care:  Advanced Directives 10/16/2020  Does Patient Have a Medical Advance Directive? Yes  Type of Advance Directive Out of facility DNR (pink MOST or yellow form)  Does patient want to make changes to medical advance directive? No - Patient declined  Copy of [place] in Chart? -  Pre-existing out of facility DNR order (yellow form or pink MOST form) Yellow form placed in chart (order not valid for inpatient use)     Chief Complaint  Patient presents with  . Medical Management of Chronic Issues    HPI: Patient is a 87 y.o. female seen today for medical management of chronic diseases.    Patient has h/o Chronic Back Pain, Hypertension, Hypothyroidism, Hypertension, Recurrent Cystitis, Osteoporosis and Major DepressionAnd Pancytopenia  [place] lives in [place] Doing well Has lost some weight . But Continues have issues with her back Does not want Hydrocodone Walks with her walker Independent in her ADLS  Past Medical History:  Diagnosis Date  . Anemia   . Anxiety   . Chronotropic incompetence 12/2012    potentially medication related; noted on CPET   . DDD (degenerative disc disease)   . Eczema   . GERD (gastroesophageal reflux disease)   . H/O hiatal hernia   . Hx: UTI (urinary tract infection)   . Hyperlipidemia   . Hypertension   . Hypothyroidism   . Osteopenia   . Septicemia ([place]) 2002   following UTI  . Vertigo, benign positional     Past Surgical History:  Procedure Laterality Date  . BREAST SURGERY     left biopsy  . CATARACT EXTRACTION Right    2 weeks ago  . CPET / MET - PFTS     Consistent with chronotropic incompetence; See attached report in the results section  . DILATION AND CURETTAGE OF UTERUS     x 2  . DOPPLER ECHOCARDIOGRAPHY  01/10/2013   Normal LV size and function.  Normal EF. Air sclerosis but no stenosis  . ESOPHAGOGASTRODUODENOSCOPY  05/04/2012   Procedure: ESOPHAGOGASTRODUODENOSCOPY (EGD);  Surgeon: Inda Castle, MD;  Location: [facility];  Service: Endoscopy;  Laterality: N/A;  . EYE SURGERY     cataract extraction with ILO  ? eye  . IR KYPHO EA ADDL LEVEL THORACIC OR LUMBAR  10/28/2016  . IR KYPHO LUMBAR INC FX REDUCE BONE BX UNI/BIL CANNULATION INC/IMAGING  10/28/2016  . IR KYPHO THORACIC WITH BONE BIOPSY  12/24/2016  . IR RADIOLOGIST EVAL & MGMT  11/11/2016  . KNEE ARTHROSCOPY  2011   right  . TONSILLECTOMY    . TOTAL KNEE ARTHROPLASTY  04/24/2012   Procedure: TOTAL KNEE ARTHROPLASTY;  Surgeon: Gearlean Alf, MD;  Location: [facility];  Service: Orthopedics;  Laterality: Right;  . TRANSTHORACIC ECHOCARDIOGRAM  02/2018   Ordered by PCP for "congestive heart failure " -EF 60 M 65%.  GR 1 DD.  No R WMA--- > ESSENTIALLY NORMAL    No Known Allergies  Outpatient Encounter Medications as of 10/16/2020  Medication Sig  . acetaminophen (TYLENOL) 500 MG tablet Take 1,000 mg by mouth 3 (three) times daily as needed.   . ALPRAZolam (XANAX) 0.25 MG tablet Take 1 tablet (0.25 mg total) by mouth at bedtime.  Marland Kitchen atorvastatin (LIPITOR) 10 MG tablet Take 5 mg by mouth daily.   Marland Kitchen  buPROPion (WELLBUTRIN SR) 150 MG 12 hr tablet Take 1 tablet (150 mg total) by mouth 2 (two) times daily after a meal.  . Calcium-Phosphorus-Vitamin D (CITRACAL +D3 PO) Take 2 capsules by mouth 2 (two) times a day. 659m-25mcg(1000u)  . Camphor-Menthol-Methyl Sal 3.07-31-08 % PTCH Place 1 patch onto the skin. Apply one to lower back daily for back pain  . docusate sodium (COLACE) 100 MG capsule Take 100 mg by mouth 2 (two) times daily.  . ferrous sulfate 325 (65 FE) MG EC tablet Take 325 mg by mouth every Monday, Wednesday, and Friday. With a meal  . Fiber POWD Take 15 mLs by mouth daily. Mix with 4-8 oz of water  . fish oil-omega-3 fatty acids 1000 MG capsule Take 1 g by mouth daily.  .Marland Kitchen gabapentin (NEURONTIN) 100 MG capsule Take 100 mg by mouth 3 (three) times daily.  . hydrocortisone cream 1 % Apply 1 application topically daily as needed for itching. PRN to urogenital itchy area X 10 days than PRN  . KLOR-CON M20 20 MEQ tablet TAKE 1 TABLET BY MOUTH TWICE A DAY  . levothyroxine (SYNTHROID, LEVOTHROID) 75 MCG tablet Take 1 tablet (75 mcg total) by mouth daily before breakfast.  . losartan (COZAAR) 50 MG tablet Take 50 mg by mouth daily.   . nitrofurantoin (MACRODANTIN) 50 MG capsule Take 1 capsule (50 mg total) by mouth daily.  .Marland Kitchennystatin (MYCOSTATIN/NYSTOP) powder Apply 100,000 g topically 2 (two) times daily as needed.   . nystatin cream (MYCOSTATIN) Apply 1 application topically as needed for dry skin.  .Marland Kitchenomeprazole (PRILOSEC) 40 MG capsule Take 40 mg by mouth daily.  . polyethylene glycol (MIRALAX / GLYCOLAX) 17 g packet Take 17 g by mouth as directed. On Monday and Thursday  . sertraline (ZOLOFT) 25 MG tablet Take 75 mg by mouth daily. 3 tablets=75 mg daily  . torsemide (DEMADEX) 20 MG tablet Take 20 mg by mouth daily.  . traMADol (ULTRAM) 50 MG tablet Take 75 mg by mouth every 6 (six) hours as needed.  . [DISCONTINUED] methocarbamol (ROBAXIN) 500 MG tablet Take 250 mg by mouth daily as needed. For muscle spasms  . [DISCONTINUED] traMADol (ULTRAM) 50 MG tablet Take 1 tablet (50 mg total) by mouth every 6 (six) hours as needed.   No facility-administered encounter medications on file as of 10/16/2020.    Review of Systems:  Review of Systems  Constitutional: Positive for activity change and appetite change.  HENT: Negative.   Respiratory: Negative.   Cardiovascular: Positive for leg swelling.  Gastrointestinal: Negative.   Genitourinary: Negative.   Musculoskeletal: Positive for arthralgias, back pain, gait problem and myalgias.  Skin: Negative.   Neurological: Positive for weakness.  Psychiatric/Behavioral: Positive for dysphoric mood.    Health Maintenance   Topic Date Due  . COVID-19 Vaccine (3 - Moderna risk 4-dose series) 09/24/2019  . INFLUENZA VACCINE  02/24/2020  . PNA vac Low Risk Adult (2 of 2 - PCV13) 05/14/2020  . TETANUS/TDAP  05/14/2029  . DEXA SCAN  Completed  . HPV VACCINES  Aged Out    Physical Exam: Vitals:   10/17/20 1626  BP: (!) 163/78  Pulse: 89  Resp: (!) 22  Temp: (!) 97.5 F (36.4 C)  SpO2: 95%  Weight: 168 lb 8 oz (76.4 kg)  Height: '5\' 4"'  (1.626 m)   Body mass index is 28.92 kg/m. Physical Exam Vitals reviewed.  Constitutional:      Appearance: Normal appearance. She is obese.  HENT:     Head: Normocephalic.     Nose: Nose normal.     Mouth/Throat:     Mouth: Mucous membranes are moist.     Pharynx: Oropharynx is clear.  Eyes:     Pupils: Pupils are equal, round, and reactive to light.  Cardiovascular:     Rate and Rhythm: Normal rate and regular rhythm.     Pulses: Normal pulses.     Heart sounds: Normal heart sounds.  Pulmonary:     Effort: Pulmonary effort is normal.     Breath sounds: Normal breath sounds.  Abdominal:     General: Abdomen is flat. Bowel sounds are normal.     Palpations: Abdomen is soft.  Musculoskeletal:     Cervical back: Neck supple.     Comments: Mild Swelling bilateral  Skin:    General: Skin is warm.  Neurological:     General: No focal deficit present.     Mental Status: She is alert.  Psychiatric:        Mood and Affect: Mood normal.        Thought Content: Thought content normal.     Labs reviewed: Basic Metabolic Panel: Recent Labs    04/10/20 0000 06/05/20 0000 10/16/20 1804  NA 141 137 139  K 4.1 4.0 4.2  CL 104 100 105  CO2 30* 29* 26  GLUCOSE  --   --  111*  BUN 31* 27* 35*  CREATININE 1.3* 1.4* 1.38*  CALCIUM 9.7 9.6 9.7  TSH  --  4.69  --    Liver Function Tests: Recent Labs    04/10/20 0000 06/05/20 0000  AST 14 20  ALT 8 14  ALKPHOS 85 94  ALBUMIN 4.0 3.9   No results for input(s): LIPASE, AMYLASE in the last 8760  hours. No results for input(s): AMMONIA in the last 8760 hours. CBC: Recent Labs    04/10/20 0000 06/05/20 0000 10/16/20 1804  WBC 3.4 4.4 8.5  NEUTROABS 1,510.00 2,213.00  --   HGB 10.0* 9.7* 10.1*  HCT 32* 31* 33.7*  MCV  --   --  89.4  PLT 92* 77* 97*   Lipid Panel: Recent Labs    04/10/20 0000  CHOL 154  HDL 47  LDLCALC 87  TRIG 108   No results found for: HGBA1C  Procedures since last visit: DG Thoracic Spine 2 View  Result Date: 10/16/2020 CLINICAL DATA:  Fall, back pain EXAM: THORACIC SPINE 2 VIEWS COMPARISON:  CT 01/14/2018 FINDINGS: There is normal thoracic kyphosis. Vertebroplasty of T9, T11, L1, and L2 have been performed with stable compression deformities. No acute fracture or listhesis of the a thoracic spine. Remaining vertebral body height and intervertebral disc height has been preserved. The paraspinal soft tissues are unremarkable. IMPRESSION: No acute fracture or listhesis of the thoracic spine. Electronically Signed   By: Fidela Salisbury MD   On: 10/16/2020 18:05   DG Lumbar Spine Complete  Result Date: 10/16/2020 CLINICAL DATA:  Fall, back pain EXAM: LUMBAR SPINE - COMPLETE 4+ VIEW COMPARISON:  MRI 12/23/2016 FINDINGS: Mild lumbar dextroscoliosis, apex right at L1 is again identified. T11, L1, and L2 vertebroplasty has been performed. Stable compression deformity. No acute fracture or listhesis of the lumbar spine. Remaining vertebral body height has been preserved. There is eccentric intervertebral disc space narrowing and endplate remodeling at O6-7 in keeping with changes of moderate degenerative disc disease at this level. The paraspinal soft tissues are unremarkable. IMPRESSION: No acute fracture  or listhesis. Electronically Signed   By: Fidela Salisbury MD   On: 10/16/2020 18:03   DG Hips Bilat W or Wo Pelvis 3-4 Views  Result Date: 10/16/2020 CLINICAL DATA:  Fall, bilateral hip pain EXAM: DG HIP (WITH OR WITHOUT PELVIS) 3-4V BILAT COMPARISON:  None.  FINDINGS: Normal alignment. No fracture or dislocation. Mild bilateral degenerative hip arthritis. Soft tissues are unremarkable. IMPRESSION: Degenerative change.  No acute fracture or dislocation. Electronically Signed   By: Fidela Salisbury MD   On: 10/16/2020 18:06    Assessment/Plan Essential hypertension BP mildily elevated but continue Cozaar for now Pancytopenia ([place]) No More Work up per [facility] Repeat CBC Acquired hypothyroidism Repeat TSH Chronic bilateral low back pain without sciatica Change Tramadol to 75 mg Q 6  PRN And Neurontin Recurrent cystitis On Nitrofurantoin Depression, recurrent (HCC) On Wellbutrin and Zoloft and Xanax Mixed hyperlipidemia Repeat Lipid panel On Lipitor LE edema On Demadex Repeat BMP   Labs/tests ordered:  CBC,CMP,TSH,Lipid panel,

## 2020-10-16 NOTE — ED Notes (Signed)
[facility] called for transport.  

## 2020-10-16 NOTE — ED Triage Notes (Signed)
Patient BIB [facility] from [facility] for a non-witnessed fall onto right side. EMS reports that patient denies no LOC and c/o lower back pain 10/10.

## 2020-10-16 NOTE — ED Notes (Signed)
Assisted pt to restroom using Toniann Ket. Pt aware to use callbell when she is done

## 2020-10-16 NOTE — Discharge Instructions (Signed)
Take the medications as needed for pain.  Follow-up with your doctor.

## 2020-10-16 NOTE — ED Notes (Signed)
Attempted to ambulate patient. Patient unable to walk unassisted. Patient was placed on Steady and taken to restroom. 2 people assisted pt to commode and back.

## 2020-10-17 ENCOUNTER — Encounter: Payer: Self-pay | Admitting: Nurse Practitioner

## 2020-10-17 ENCOUNTER — Non-Acute Institutional Stay: Payer: Medicare Other | Admitting: Nurse Practitioner

## 2020-10-17 ENCOUNTER — Encounter: Payer: Self-pay | Admitting: Internal Medicine

## 2020-10-17 DIAGNOSIS — H0100B Unspecified blepharitis left eye, upper and lower eyelids: Secondary | ICD-10-CM

## 2020-10-17 DIAGNOSIS — M6281 Muscle weakness (generalized): Secondary | ICD-10-CM | POA: Diagnosis not present

## 2020-10-17 DIAGNOSIS — Z9181 History of falling: Secondary | ICD-10-CM | POA: Diagnosis not present

## 2020-10-17 DIAGNOSIS — R600 Localized edema: Secondary | ICD-10-CM | POA: Diagnosis not present

## 2020-10-17 DIAGNOSIS — D638 Anemia in other chronic diseases classified elsewhere: Secondary | ICD-10-CM | POA: Diagnosis not present

## 2020-10-17 DIAGNOSIS — E039 Hypothyroidism, unspecified: Secondary | ICD-10-CM | POA: Diagnosis not present

## 2020-10-17 DIAGNOSIS — H0100A Unspecified blepharitis right eye, upper and lower eyelids: Secondary | ICD-10-CM

## 2020-10-17 DIAGNOSIS — N183 Chronic kidney disease, stage 3 unspecified: Secondary | ICD-10-CM | POA: Diagnosis not present

## 2020-10-17 DIAGNOSIS — K219 Gastro-esophageal reflux disease without esophagitis: Secondary | ICD-10-CM

## 2020-10-17 DIAGNOSIS — K644 Residual hemorrhoidal skin tags: Secondary | ICD-10-CM | POA: Diagnosis not present

## 2020-10-17 DIAGNOSIS — M545 Low back pain, unspecified: Secondary | ICD-10-CM | POA: Diagnosis not present

## 2020-10-17 DIAGNOSIS — K5909 Other constipation: Secondary | ICD-10-CM | POA: Diagnosis not present

## 2020-10-17 DIAGNOSIS — N3090 Cystitis, unspecified without hematuria: Secondary | ICD-10-CM

## 2020-10-17 DIAGNOSIS — R2681 Unsteadiness on feet: Secondary | ICD-10-CM | POA: Diagnosis not present

## 2020-10-17 DIAGNOSIS — I10 Essential (primary) hypertension: Secondary | ICD-10-CM

## 2020-10-17 DIAGNOSIS — R296 Repeated falls: Secondary | ICD-10-CM

## 2020-10-17 DIAGNOSIS — G8929 Other chronic pain: Secondary | ICD-10-CM

## 2020-10-17 NOTE — Assessment & Plan Note (Signed)
takes Omeprazole 40mg  qd

## 2020-10-17 NOTE — Assessment & Plan Note (Signed)
Hx of recurrent UTI, currently on Nitrofurantoin 50mg  qd for UTI suppression therapy.

## 2020-10-17 NOTE — Assessment & Plan Note (Signed)
blood pressure is controlled on Losartan 50mg  qd.

## 2020-10-17 NOTE — Assessment & Plan Note (Signed)
no injury.

## 2020-10-17 NOTE — Assessment & Plan Note (Signed)
No constipation while taking prn MiraLax, Fiber daily, Colace bid.

## 2020-10-17 NOTE — Assessment & Plan Note (Signed)
Bun/creat 35/1.38 10/16/20

## 2020-10-17 NOTE — Assessment & Plan Note (Signed)
minimal, takes Torsemide 20mg  qd.Bun/creat 35/1.38 10/16/20

## 2020-10-17 NOTE — Assessment & Plan Note (Signed)
takes Levothyroxine qd, TSH4.69 06/05/20

## 2020-10-17 NOTE — Assessment & Plan Note (Signed)
takes Fe 3x/wk, Hgb 10.1 10/16/20

## 2020-10-17 NOTE — Assessment & Plan Note (Addendum)
Lower back pain, takes Gabapentin 100mg  tid, prn Methocarbamol. 10/16/20 X-ray Vertebroplasty of T9, T11, L1, and L2 have been performed with stable compression deformities  ED eval 10/16/20 acute lower back pain w/o sciatica, BMP, CBC, X-ray hips, pelvis, L spine, T spine, EKG unremarkable. Prn Tramadol was increased to 75mg  q6hr prn.

## 2020-10-17 NOTE — Progress Notes (Signed)
Location:    [facility] Room Number: 39 Place of Service:  ALF [phone]) Provider: Marlana Latus NP  Virgie Dad, MD  Patient Care Team: Virgie Dad, MD as PCP - General (Internal Medicine) Donnarae Rae X, NP as Nurse Practitioner (Internal Medicine)  Extended Emergency Contact Information Primary Emergency Contact: Heringer,[name] Address: [address]          [place], [place] [address] [place] Phone: [phone] Mobile Phone: [phone] Relation: Son Secondary Emergency Contact: Anguiano, Kathi [place] Phone: [phone] Mobile Phone: [phone] Relation: Other  Code Status:  DNR Goals of care: Advanced Directive information Advanced Directives 10/16/2020  Does Patient Have a Medical Advance Directive? Yes  Type of Advance Directive Out of facility DNR (pink MOST or yellow form)  Does patient want to make changes to medical advance directive? No - Patient declined  Copy of [place] in Chart? -  Pre-existing out of facility DNR order (yellow form or pink MOST form) Yellow form placed in chart (order not valid for inpatient use)     Chief Complaint  Patient presents with  . Acute Visit    s/p ED eval for fall    HPI:  Pt is a 87 y.o. female seen today for an acute visit for ED eval 10/16/20 for unwitnessed fall when the patient was observed on the bedroom floor on her right side. The patient stated she lost her balance while looking in closet.   ED eval 10/16/20 acute lower back pain w/o sciatica, BMP, CBC, X-ray hips, pelvis, L spine, T spine, EKG unremarkable. Prn Norco used.   CKD, Bun/creat 35/1.38 10/16/20  Hx of recurrent UTI, currently on Nitrofurantoin 110m qd for UTI suppression therapy.  HTN, blood pressure is controlled on Losartan 560mqd.  Hemorrhoids, no injury. No constipation while taking prn MiraLax, Fiber daily, Colace  bid. Anemia, takes Fe 3x/wk, Hgb 10.1 10/16/20 Lower back pain, takes Gabapentin 10078mid, prn Methocarbamol. 10/16/20 X-ray Vertebroplasty of T9, T11, L1, and L2 have been performed with stable compression deformities Hypothyroidism, takes Levothyroxine 45m55md, TSH4.69 06/05/20 GERD, takes Omeprazole 40mg85mEdema BLE, minimal, takes Torsemide 20mg 71mun/creat 35/1.38 10/16/20 Blepharitis, better, on Erythromycin ophthalmic oint    Past Medical History:  Diagnosis Date  . Anemia   . Anxiety   . Chronotropic incompetence 12/2012    potentially medication related; noted on CPET   . DDD (degenerative disc disease)   . Eczema   . GERD (gastroesophageal reflux disease)   . H/O hiatal hernia   . Hx: UTI (urinary tract infection)   . Hyperlipidemia   . Hypertension   . Hypothyroidism   . Osteopenia   . Septicemia (HCC) [facility]   following UTI  . Vertigo, benign positional    Past Surgical History:  Procedure Laterality Date  . BREAST SURGERY     left biopsy  . CATARACT EXTRACTION Right    2 weeks ago  . CPET / MET - PFTS     Consistent with chronotropic incompetence; See attached report in the results section  . DILATION AND CURETTAGE OF UTERUS     x 2  . DOPPLER ECHOCARDIOGRAPHY  01/10/2013   Normal LV size and function. Normal EF. Air sclerosis but no stenosis  . ESOPHAGOGASTRODUODENOSCOPY  05/04/2012   Procedure: ESOPHAGOGASTRODUODENOSCOPY (EGD);  Surgeon: RobertInda Castle [facility];  Service: Endoscopy;  Laterality: N/A;  . EYE SURGERY  cataract extraction with ILO  ? eye  . IR KYPHO EA ADDL LEVEL THORACIC OR LUMBAR  10/28/2016  . IR KYPHO LUMBAR INC FX REDUCE BONE BX UNI/BIL CANNULATION INC/IMAGING  10/28/2016  . IR KYPHO THORACIC WITH BONE BIOPSY  12/24/2016  . IR RADIOLOGIST EVAL & MGMT  11/11/2016  . KNEE ARTHROSCOPY  2011   right  . TONSILLECTOMY    . TOTAL KNEE ARTHROPLASTY   04/24/2012   Procedure: TOTAL KNEE ARTHROPLASTY;  Surgeon: Gearlean Alf, MD;  Location: [facility];  Service: Orthopedics;  Laterality: Right;  . TRANSTHORACIC ECHOCARDIOGRAM  02/2018   Ordered by PCP for "congestive heart failure " -EF 60 M 65%.  GR 1 DD.  No R WMA--- > ESSENTIALLY NORMAL    No Known Allergies  Allergies as of 10/17/2020   No Known Allergies     Medication List       Accurate as of October 17, 2020 11:59 PM. If you have any questions, ask your nurse or doctor.        STOP taking these medications   Lidocaine 4 % Ptch Commonly known as: HM Lidocaine Patch Stopped by: Izak Anding X Latifa Noble, NP   methocarbamol 500 MG tablet Commonly known as: ROBAXIN Stopped by: Takeshi Teasdale X Salley Boxley, NP     TAKE these medications   acetaminophen 500 MG tablet Commonly known as: TYLENOL Take 1,000 mg by mouth 3 (three) times daily as needed.   ALPRAZolam 0.25 MG tablet Commonly known as: XANAX Take 1 tablet (0.25 mg total) by mouth at bedtime.   atorvastatin 10 MG tablet Commonly known as: LIPITOR Take 5 mg by mouth daily.   buPROPion 150 MG 12 hr tablet Commonly known as: WELLBUTRIN SR Take 1 tablet (150 mg total) by mouth 2 (two) times daily after a meal.   Camphor-Menthol-Methyl Sal 3.07-31-08 % Ptch Place 1 patch onto the skin. Apply one to lower back daily for back pain   CITRACAL +D3 PO Take 2 capsules by mouth 2 (two) times a day. 683m-25mcg(1000u)   docusate sodium 100 MG capsule Commonly known as: COLACE Take 100 mg by mouth 2 (two) times daily.   ferrous sulfate 325 (65 FE) MG EC tablet Take 325 mg by mouth every Monday, Wednesday, and Friday. With a meal   Fiber Powd Take 15 mLs by mouth daily. Mix with 4-8 oz of water   fish oil-omega-3 fatty acids 1000 MG capsule Take 1 g by mouth daily.   gabapentin 100 MG capsule Commonly known as: NEURONTIN Take 100 mg by mouth 3 (three) times daily.   hydrocortisone cream 1 % Apply 1 application topically daily as needed for  itching. PRN to urogenital itchy area X 10 days than PRN   Klor-Con M20 20 MEQ tablet Generic drug: potassium chloride SA TAKE 1 TABLET BY MOUTH TWICE A DAY   levothyroxine 75 MCG tablet Commonly known as: SYNTHROID Take 1 tablet (75 mcg total) by mouth daily before breakfast.   losartan 50 MG tablet Commonly known as: COZAAR Take 50 mg by mouth daily.   nitrofurantoin 50 MG capsule Commonly known as: MACRODANTIN Take 1 capsule (50 mg total) by mouth daily.   nystatin powder Commonly known as: MYCOSTATIN/NYSTOP Apply 100,000 g topically 2 (two) times daily as needed.   nystatin cream Commonly known as: MYCOSTATIN Apply 1 application topically as needed for dry skin.   omeprazole 40 MG capsule Commonly known as: PRILOSEC Take 40 mg by mouth daily.   polyethylene glycol 17  g packet Commonly known as: MIRALAX / GLYCOLAX Take 17 g by mouth as directed. On Monday and Thursday   sertraline 25 MG tablet Commonly known as: ZOLOFT Take 75 mg by mouth daily. 3 tablets=75 mg daily   torsemide 20 MG tablet Commonly known as: DEMADEX Take 20 mg by mouth daily.   traMADol 50 MG tablet Commonly known as: ULTRAM Take 75 mg by mouth every 6 (six) hours as needed. What changed: Another medication with the same name was removed. Continue taking this medication, and follow the directions you see here. Changed by: Eden Toohey X Woodley Petzold, NP       Review of Systems  Constitutional: Negative for activity change, appetite change and fever.       Chronic fatigue.   HENT: Positive for hearing loss. Negative for congestion and voice change.   Eyes: Negative for visual disturbance.  Respiratory: Positive for shortness of breath. Negative for cough and wheezing.        DOE  Cardiovascular: Positive for leg swelling. Negative for chest pain and palpitations.  Gastrointestinal: Negative for abdominal pain and constipation.  Genitourinary: Negative for difficulty urinating, dysuria and urgency.        Urinary leakage.   Musculoskeletal: Positive for arthralgias, back pain and gait problem. Negative for joint swelling.       R hip pain, lower back pain. Unsteady gait. Frequent falls.   Skin: Negative for color change.  Neurological: Negative for speech difficulty, weakness, light-headedness and headaches.       Hx of vertigo, chronic. Memory lapses.   Psychiatric/Behavioral: Negative for behavioral problems and sleep disturbance. The patient is not nervous/anxious.     Immunization History  Administered Date(s) Administered  . Influenza, High Dose Seasonal PF 04/26/2016, 03/22/2017, 05/08/2019  . Influenza,inj,Quad PF,6+ Mos 04/27/2018  . Influenza-Unspecified 04/22/2011, 04/04/2012, 05/03/2013, 05/04/2013, 04/17/2014, 04/19/2015, 04/26/2016, 04/22/2017  . Moderna Sars-Covid-2 Vaccination 07/30/2019, 08/27/2019  . Pneumococcal Polysaccharide-23 08/26/2003, 09/06/2011, 05/15/2019  . Pneumococcal-Unspecified 12/20/2013  . Td 09/26/2006  . Tdap 05/15/2019  . Zoster 10/25/2007  . Zoster Recombinat (Shingrix) 11/21/2017, 04/08/2018   Pertinent  Health Maintenance Due  Topic Date Due  . PNA vac Low Risk Adult (2 of 2 - PCV13) 05/14/2020  . INFLUENZA VACCINE  02/23/2021  . DEXA SCAN  Completed   Fall Risk  10/18/2018 08/23/2018 05/09/2018 05/01/2018 04/10/2018  Falls in the past year? 0 0 Yes Yes No  Comment - - - [facility] June/2019 [facility] -  Number falls in past yr: 0 0 1 1 -  Injury with Fall? 0 0 No No -  Risk for fall due to : - - - Impaired balance/gait -  Follow up - - - Falls prevention discussed -   Functional Status Survey:    Vitals:   10/17/20 1428  BP: (!) 163/78  Pulse: 89  Resp: (!) 22  Temp: (!) 97.5 F (36.4 C)  SpO2: 95%  Weight: 168 lb 8 oz (76.4 kg)  Height: '5\' 4"'  (1.626 m)   Body mass index is 28.92 kg/m. Physical Exam Vitals and nursing note reviewed.  Constitutional:      Appearance: Normal appearance.  HENT:     Head:  Normocephalic and atraumatic.     Mouth/Throat:     Mouth: Mucous membranes are moist.  Eyes:     Extraocular Movements: Extraocular movements intact.     Conjunctiva/sclera: Conjunctivae normal.     Pupils: Pupils are equal, round, and reactive to light.  Cardiovascular:     Rate and Rhythm: Normal rate and regular rhythm.     Heart sounds: No murmur heard.   Pulmonary:     Breath sounds: No wheezing.  Abdominal:     General: Bowel sounds are normal.     Palpations: Abdomen is soft.     Tenderness: There is no abdominal tenderness.  Genitourinary:    Comments: External hemorrhoid per previous examination.  Musculoskeletal:        General: Tenderness present.     Cervical back: Normal range of motion and neck supple.     Right lower leg: Edema present.     Left lower leg: Edema present.     Comments: Edema trace BLE. Chronic lower back, hip pain.   Skin:    General: Skin is warm and dry.  Neurological:     General: No focal deficit present.     Mental Status: She is alert. Mental status is at baseline.     Motor: No weakness.     Coordination: Coordination normal.     Gait: Gait abnormal.     Comments: Oriented to person, place.   Psychiatric:        Mood and Affect: Mood normal.        Behavior: Behavior normal.        Thought Content: Thought content normal.     Labs reviewed: Recent Labs    04/10/20 0000 06/05/20 0000 10/16/20 1804  NA 141 137 139  K 4.1 4.0 4.2  CL 104 100 105  CO2 30* 29* 26  GLUCOSE  --   --  111*  BUN 31* 27* 35*  CREATININE 1.3* 1.4* 1.38*  CALCIUM 9.7 9.6 9.7   Recent Labs    04/10/20 0000 06/05/20 0000  AST 14 20  ALT 8 14  ALKPHOS 85 94  ALBUMIN 4.0 3.9   Recent Labs    04/10/20 0000 06/05/20 0000 10/16/20 1804  WBC 3.4 4.4 8.5  NEUTROABS 1,510.00 2,213.00  --   HGB 10.0* 9.7* 10.1*  HCT 32* 31* 33.7*  MCV  --   --  89.4  PLT 92* 77* 97*   Lab Results  Component Value Date   TSH 4.69 06/05/2020   No results  found for: HGBA1C Lab Results  Component Value Date   CHOL 154 04/10/2020   HDL 47 04/10/2020   LDLCALC 87 04/10/2020   TRIG 108 04/10/2020   CHOLHDL 3.8 11/23/2018    Significant Diagnostic Results in last 30 days:  DG Thoracic Spine 2 View  Result Date: 10/16/2020 CLINICAL DATA:  Fall, back pain EXAM: THORACIC SPINE 2 VIEWS COMPARISON:  CT 01/14/2018 FINDINGS: There is normal thoracic kyphosis. Vertebroplasty of T9, T11, L1, and L2 have been performed with stable compression deformities. No acute fracture or listhesis of the a thoracic spine. Remaining vertebral body height and intervertebral disc height has been preserved. The paraspinal soft tissues are unremarkable. IMPRESSION: No acute fracture or listhesis of the thoracic spine. Electronically Signed   By: Fidela Salisbury MD   On: 10/16/2020 18:05   DG Lumbar Spine Complete  Result Date: 10/16/2020 CLINICAL DATA:  Fall, back pain EXAM: LUMBAR SPINE - COMPLETE 4+ VIEW COMPARISON:  MRI 12/23/2016 FINDINGS: Mild lumbar dextroscoliosis, apex right at L1 is again identified. T11, L1, and L2 vertebroplasty has been performed. Stable compression deformity. No acute fracture or listhesis of the lumbar spine. Remaining vertebral body height has been preserved. There is eccentric intervertebral disc space narrowing  and endplate remodeling at W8-8 in keeping with changes of moderate degenerative disc disease at this level. The paraspinal soft tissues are unremarkable. IMPRESSION: No acute fracture or listhesis. Electronically Signed   By: Fidela Salisbury MD   On: 10/16/2020 18:03   DG Hips Bilat W or Wo Pelvis 3-4 Views  Result Date: 10/16/2020 CLINICAL DATA:  Fall, bilateral hip pain EXAM: DG HIP (WITH OR WITHOUT PELVIS) 3-4V BILAT COMPARISON:  None. FINDINGS: Normal alignment. No fracture or dislocation. Mild bilateral degenerative hip arthritis. Soft tissues are unremarkable. IMPRESSION: Degenerative change.  No acute fracture or dislocation.  Electronically Signed   By: Fidela Salisbury MD   On: 10/16/2020 18:06    Assessment/Plan Chronic bilateral low back pain without sciatica Lower back pain, takes Gabapentin 17m tid, prn Methocarbamol. 10/16/20 X-ray Vertebroplasty of T9, T11, L1, and L2 have been performed with stable compression deformities  ED eval 10/16/20 acute lower back pain w/o sciatica, BMP, CBC, X-ray hips, pelvis, L spine, T spine, EKG unremarkable. Prn Tramadol was increased to 72mq6hr prn.    Recurrent falls ED eval 10/16/20 for unwitnessed fall when the patient was observed on the bedroom floor on her right side. The patient stated she lost her balance while looking in closet.    CKD (chronic kidney disease) stage 3, GFR 30-59 ml/min (HCC) Bun/creat 35/1.38 10/16/20   Recurrent cystitis Hx of recurrent UTI, currently on Nitrofurantoin 5099md for UTI suppression therapy.    Essential hypertension blood pressure is controlled on Losartan 49m47m.   External hemorrhoids no injury.  Chronic constipation No constipation while taking prn MiraLax, Fiber daily, Colace bid.   Anemia of chronic disease  takes Fe 3x/wk, Hgb 10.1 10/16/20  Acquired hypothyroidism takes Levothyroxine 75mc34m, TSH4.69 06/05/20   GERD (gastroesophageal reflux disease)  takes Omeprazole 40mg 69m Bilateral leg edema minimal, takes Torsemide 20mg q44mn/creat 35/1.38 10/16/20   Blepharitis of both eyes , better, on Erythromycin ophthalmic oint    Family/ staff Communication: plan of care reviewed with the patient and charge nurse.   Labs/tests ordered: none  Time spend 40 minutes.

## 2020-10-17 NOTE — Assessment & Plan Note (Signed)
,   better, on Erythromycin ophthalmic oint

## 2020-10-17 NOTE — Assessment & Plan Note (Signed)
ED eval 10/16/20 for unwitnessed fall when the patient was observed on the bedroom floor on her right side. The patient stated she lost her balance while looking in closet.

## 2020-10-20 DIAGNOSIS — D649 Anemia, unspecified: Secondary | ICD-10-CM | POA: Diagnosis not present

## 2020-10-20 DIAGNOSIS — R2681 Unsteadiness on feet: Secondary | ICD-10-CM | POA: Diagnosis not present

## 2020-10-20 DIAGNOSIS — Z9181 History of falling: Secondary | ICD-10-CM | POA: Diagnosis not present

## 2020-10-20 DIAGNOSIS — E785 Hyperlipidemia, unspecified: Secondary | ICD-10-CM | POA: Diagnosis not present

## 2020-10-20 DIAGNOSIS — M6281 Muscle weakness (generalized): Secondary | ICD-10-CM | POA: Diagnosis not present

## 2020-10-20 DIAGNOSIS — E039 Hypothyroidism, unspecified: Secondary | ICD-10-CM | POA: Diagnosis not present

## 2020-10-20 LAB — HEPATIC FUNCTION PANEL
ALT: 13 (ref 7–35)
AST: 17 (ref 13–35)
Alkaline Phosphatase: 97 (ref 25–125)
Bilirubin, Total: 0.2

## 2020-10-20 LAB — BASIC METABOLIC PANEL
BUN: 38 — AB (ref 4–21)
CO2: 27 — AB (ref 13–22)
Chloride: 103 (ref 99–108)
Creatinine: 1.6 — AB (ref 0.5–1.1)
Glucose: 95
Potassium: 4.2 (ref 3.4–5.3)
Sodium: 140 (ref 137–147)

## 2020-10-20 LAB — COMPREHENSIVE METABOLIC PANEL
Albumin: 3.9 (ref 3.5–5.0)
Calcium: 9.7 (ref 8.7–10.7)
GFR calc Af Amer: 33
GFR calc non Af Amer: 29
Globulin: 3.3

## 2020-10-20 LAB — CBC AND DIFFERENTIAL
HCT: 30 — AB (ref 36–46)
Hemoglobin: 9.3 — AB (ref 12.0–16.0)
Neutrophils Absolute: 4044
Platelets: 84 — AB (ref 150–399)
WBC: 6.1

## 2020-10-20 LAB — LIPID PANEL
Cholesterol: 165 (ref 0–200)
HDL: 44 (ref 35–70)
LDL Cholesterol: 94
LDl/HDL Ratio: 3.8
Triglycerides: 168 — AB (ref 40–160)

## 2020-10-20 LAB — CBC: RBC: 3.45 — AB (ref 3.87–5.11)

## 2020-10-20 LAB — TSH: TSH: 3.82 (ref 0.41–5.90)

## 2020-10-21 ENCOUNTER — Encounter: Payer: Self-pay | Admitting: Nurse Practitioner

## 2020-10-21 DIAGNOSIS — R2681 Unsteadiness on feet: Secondary | ICD-10-CM | POA: Diagnosis not present

## 2020-10-21 DIAGNOSIS — M6281 Muscle weakness (generalized): Secondary | ICD-10-CM | POA: Diagnosis not present

## 2020-10-21 DIAGNOSIS — Z9181 History of falling: Secondary | ICD-10-CM | POA: Diagnosis not present

## 2020-10-21 DIAGNOSIS — E785 Hyperlipidemia, unspecified: Secondary | ICD-10-CM | POA: Insufficient documentation

## 2020-10-22 DIAGNOSIS — Z9181 History of falling: Secondary | ICD-10-CM | POA: Diagnosis not present

## 2020-10-22 DIAGNOSIS — R2681 Unsteadiness on feet: Secondary | ICD-10-CM | POA: Diagnosis not present

## 2020-10-22 DIAGNOSIS — M6281 Muscle weakness (generalized): Secondary | ICD-10-CM | POA: Diagnosis not present

## 2020-10-23 DIAGNOSIS — Z9181 History of falling: Secondary | ICD-10-CM | POA: Diagnosis not present

## 2020-10-23 DIAGNOSIS — R2681 Unsteadiness on feet: Secondary | ICD-10-CM | POA: Diagnosis not present

## 2020-10-23 DIAGNOSIS — M6281 Muscle weakness (generalized): Secondary | ICD-10-CM | POA: Diagnosis not present

## 2020-10-24 DIAGNOSIS — Z9181 History of falling: Secondary | ICD-10-CM | POA: Diagnosis not present

## 2020-10-24 DIAGNOSIS — R2681 Unsteadiness on feet: Secondary | ICD-10-CM | POA: Diagnosis not present

## 2020-10-24 DIAGNOSIS — M6281 Muscle weakness (generalized): Secondary | ICD-10-CM | POA: Diagnosis not present

## 2020-10-27 DIAGNOSIS — R2681 Unsteadiness on feet: Secondary | ICD-10-CM | POA: Diagnosis not present

## 2020-10-27 DIAGNOSIS — Z9181 History of falling: Secondary | ICD-10-CM | POA: Diagnosis not present

## 2020-10-27 DIAGNOSIS — M6281 Muscle weakness (generalized): Secondary | ICD-10-CM | POA: Diagnosis not present

## 2020-10-28 DIAGNOSIS — M6281 Muscle weakness (generalized): Secondary | ICD-10-CM | POA: Diagnosis not present

## 2020-10-28 DIAGNOSIS — Z9181 History of falling: Secondary | ICD-10-CM | POA: Diagnosis not present

## 2020-10-28 DIAGNOSIS — R2681 Unsteadiness on feet: Secondary | ICD-10-CM | POA: Diagnosis not present

## 2020-10-29 DIAGNOSIS — Z9181 History of falling: Secondary | ICD-10-CM | POA: Diagnosis not present

## 2020-10-29 DIAGNOSIS — M6281 Muscle weakness (generalized): Secondary | ICD-10-CM | POA: Diagnosis not present

## 2020-10-29 DIAGNOSIS — R2681 Unsteadiness on feet: Secondary | ICD-10-CM | POA: Diagnosis not present

## 2020-10-31 DIAGNOSIS — R2681 Unsteadiness on feet: Secondary | ICD-10-CM | POA: Diagnosis not present

## 2020-10-31 DIAGNOSIS — M6281 Muscle weakness (generalized): Secondary | ICD-10-CM | POA: Diagnosis not present

## 2020-10-31 DIAGNOSIS — Z9181 History of falling: Secondary | ICD-10-CM | POA: Diagnosis not present

## 2020-11-03 DIAGNOSIS — M6281 Muscle weakness (generalized): Secondary | ICD-10-CM | POA: Diagnosis not present

## 2020-11-03 DIAGNOSIS — R2681 Unsteadiness on feet: Secondary | ICD-10-CM | POA: Diagnosis not present

## 2020-11-03 DIAGNOSIS — Z9181 History of falling: Secondary | ICD-10-CM | POA: Diagnosis not present

## 2020-11-04 DIAGNOSIS — Z9181 History of falling: Secondary | ICD-10-CM | POA: Diagnosis not present

## 2020-11-04 DIAGNOSIS — M6281 Muscle weakness (generalized): Secondary | ICD-10-CM | POA: Diagnosis not present

## 2020-11-04 DIAGNOSIS — R2681 Unsteadiness on feet: Secondary | ICD-10-CM | POA: Diagnosis not present

## 2020-11-05 DIAGNOSIS — M6281 Muscle weakness (generalized): Secondary | ICD-10-CM | POA: Diagnosis not present

## 2020-11-05 DIAGNOSIS — R2681 Unsteadiness on feet: Secondary | ICD-10-CM | POA: Diagnosis not present

## 2020-11-05 DIAGNOSIS — Z9181 History of falling: Secondary | ICD-10-CM | POA: Diagnosis not present

## 2020-11-10 DIAGNOSIS — R2681 Unsteadiness on feet: Secondary | ICD-10-CM | POA: Diagnosis not present

## 2020-11-10 DIAGNOSIS — Z9181 History of falling: Secondary | ICD-10-CM | POA: Diagnosis not present

## 2020-11-10 DIAGNOSIS — M6281 Muscle weakness (generalized): Secondary | ICD-10-CM | POA: Diagnosis not present

## 2020-11-11 DIAGNOSIS — Z9181 History of falling: Secondary | ICD-10-CM | POA: Diagnosis not present

## 2020-11-11 DIAGNOSIS — R2681 Unsteadiness on feet: Secondary | ICD-10-CM | POA: Diagnosis not present

## 2020-11-11 DIAGNOSIS — M6281 Muscle weakness (generalized): Secondary | ICD-10-CM | POA: Diagnosis not present

## 2020-11-13 DIAGNOSIS — Z9181 History of falling: Secondary | ICD-10-CM | POA: Diagnosis not present

## 2020-11-13 DIAGNOSIS — M6281 Muscle weakness (generalized): Secondary | ICD-10-CM | POA: Diagnosis not present

## 2020-11-13 DIAGNOSIS — R2681 Unsteadiness on feet: Secondary | ICD-10-CM | POA: Diagnosis not present

## 2020-11-14 DIAGNOSIS — Z9181 History of falling: Secondary | ICD-10-CM | POA: Diagnosis not present

## 2020-11-14 DIAGNOSIS — M79671 Pain in right foot: Secondary | ICD-10-CM | POA: Diagnosis not present

## 2020-11-14 DIAGNOSIS — M79672 Pain in left foot: Secondary | ICD-10-CM | POA: Diagnosis not present

## 2020-11-14 DIAGNOSIS — L84 Corns and callosities: Secondary | ICD-10-CM | POA: Diagnosis not present

## 2020-11-14 DIAGNOSIS — R2681 Unsteadiness on feet: Secondary | ICD-10-CM | POA: Diagnosis not present

## 2020-11-14 DIAGNOSIS — B351 Tinea unguium: Secondary | ICD-10-CM | POA: Diagnosis not present

## 2020-11-14 DIAGNOSIS — M6281 Muscle weakness (generalized): Secondary | ICD-10-CM | POA: Diagnosis not present

## 2020-11-17 ENCOUNTER — Non-Acute Institutional Stay: Payer: Medicare Other | Admitting: Orthopedic Surgery

## 2020-11-17 ENCOUNTER — Encounter: Payer: Self-pay | Admitting: Orthopedic Surgery

## 2020-11-17 DIAGNOSIS — Z9181 History of falling: Secondary | ICD-10-CM | POA: Diagnosis not present

## 2020-11-17 DIAGNOSIS — R2681 Unsteadiness on feet: Secondary | ICD-10-CM | POA: Diagnosis not present

## 2020-11-17 DIAGNOSIS — M6281 Muscle weakness (generalized): Secondary | ICD-10-CM | POA: Diagnosis not present

## 2020-11-17 DIAGNOSIS — R600 Localized edema: Secondary | ICD-10-CM

## 2020-11-17 NOTE — Addendum Note (Signed)
Addended byHazle Nordmann E on: 11/17/2020 01:27 PM   Modules accepted: Level of Service

## 2020-11-17 NOTE — Progress Notes (Signed)
Location:  [facility] Room Number: 76 Place of Service:  ALF [phone]) Provider: Windell Moulding, AGNP-C  Virgie Dad, MD  Patient Care Team: Virgie Dad, MD as PCP - General (Internal Medicine) Mast, Man X, NP as Nurse Practitioner (Internal Medicine)  Extended Emergency Contact Information Primary Emergency Contact: Hunt,[name] Address: [address]          [place], [place] [address] [place] Phone: [phone] Mobile Phone: [phone] Relation: Son Secondary Emergency Contact: Zbikowski, Kathi Der [place] Phone: [phone] Mobile Phone: [phone] Relation: Other  Code Status: DNR Goals of care: Advanced Directive information Advanced Directives 10/16/2020  Does Patient Have a Medical Advance Directive? Yes  Type of Advance Directive Out of facility DNR (pink MOST or yellow form)  Does patient want to make changes to medical advance directive? No - Patient declined  Copy of [place] in Chart? -  Pre-existing out of facility DNR order (yellow form or pink MOST form) Yellow form placed in chart (order not valid for inpatient use)     Chief Complaint  Patient presents with  . Acute Visit    Ankle edema    HPI:  Pt is a 87 y.o. female seen today for acute visit for ankle edema.   Nurses report increased bilateral ankle edema. Began a few days ago. She denies sob or chest pain. Reports some sob with exertion, she has had for years. Remains on regular diet, tries to limit sodium. Wears compression stockings during day. Does not elevate legs often. Receiving torsemide 20 mg po daily with potassium.   Monthly weights are as follows:  04/11- 181.8 lbs  03/06- 168.5 lbs  01/05- 179.6 lbs  Nurse does not report any other concerns.   Past Medical History:  Diagnosis Date  . Anemia   . Anxiety   . Chronotropic incompetence 12/2012    potentially medication related; noted on CPET   . DDD  (degenerative disc disease)   . Eczema   . GERD (gastroesophageal reflux disease)   . H/O hiatal hernia   . Hx: UTI (urinary tract infection)   . Hyperlipidemia   . Hypertension   . Hypothyroidism   . Osteopenia   . Septicemia ([facility]) 2002   following UTI  . Vertigo, benign positional    Past Surgical History:  Procedure Laterality Date  . BREAST SURGERY     left biopsy  . CATARACT EXTRACTION Right    2 weeks ago  . CPET / MET - PFTS     Consistent with chronotropic incompetence; See attached report in the results section  . DILATION AND CURETTAGE OF UTERUS     x 2  . DOPPLER ECHOCARDIOGRAPHY  01/10/2013   Normal LV size and function. Normal EF. Air sclerosis but no stenosis  . ESOPHAGOGASTRODUODENOSCOPY  05/04/2012   Procedure: ESOPHAGOGASTRODUODENOSCOPY (EGD);  Surgeon: Inda Castle, MD;  Location: [facility];  Service: Endoscopy;  Laterality: N/A;  . EYE SURGERY     cataract extraction with ILO  ? eye  . IR KYPHO EA ADDL LEVEL THORACIC OR LUMBAR  10/28/2016  . IR KYPHO LUMBAR INC FX REDUCE BONE BX UNI/BIL CANNULATION INC/IMAGING  10/28/2016  . IR KYPHO THORACIC WITH BONE BIOPSY  12/24/2016  . IR RADIOLOGIST EVAL & MGMT  11/11/2016  . KNEE ARTHROSCOPY  2011   right  . TONSILLECTOMY    . TOTAL KNEE ARTHROPLASTY  04/24/2012   Procedure: TOTAL  KNEE ARTHROPLASTY;  Surgeon: Gearlean Alf, MD;  Location: [facility];  Service: Orthopedics;  Laterality: Right;  . TRANSTHORACIC ECHOCARDIOGRAM  02/2018   Ordered by PCP for "congestive heart failure " -EF 60 M 65%.  GR 1 DD.  No R WMA--- > ESSENTIALLY NORMAL    No Known Allergies  Outpatient Encounter Medications as of 11/17/2020  Medication Sig  . acetaminophen (TYLENOL) 500 MG tablet Take 1,000 mg by mouth 3 (three) times daily as needed.   . ALPRAZolam (XANAX) 0.25 MG tablet Take 1 tablet (0.25 mg total) by mouth at bedtime.  Marland Kitchen atorvastatin (LIPITOR) 10 MG tablet Take 5 mg by mouth daily.   Marland Kitchen buPROPion (WELLBUTRIN SR) 150 MG 12 hr  tablet Take 1 tablet (150 mg total) by mouth 2 (two) times daily after a meal.  . Calcium-Phosphorus-Vitamin D (CITRACAL +D3 PO) Take 2 capsules by mouth 2 (two) times a day. 654m-25mcg(1000u)  . Camphor-Menthol-Methyl Sal 3.07-31-08 % PTCH Place 1 patch onto the skin. Apply one to lower back daily for back pain  . docusate sodium (COLACE) 100 MG capsule Take 100 mg by mouth 2 (two) times daily.  . ferrous sulfate 325 (65 FE) MG EC tablet Take 325 mg by mouth every Monday, Wednesday, and Friday. With a meal  . Fiber POWD Take 15 mLs by mouth daily. Mix with 4-8 oz of water  . fish oil-omega-3 fatty acids 1000 MG capsule Take 1 g by mouth daily.  .Marland Kitchengabapentin (NEURONTIN) 100 MG capsule Take 100 mg by mouth 3 (three) times daily.  . hydrocortisone cream 1 % Apply 1 application topically daily as needed for itching. PRN to urogenital itchy area X 10 days than PRN  . KLOR-CON M20 20 MEQ tablet TAKE 1 TABLET BY MOUTH TWICE A DAY  . levothyroxine (SYNTHROID, LEVOTHROID) 75 MCG tablet Take 1 tablet (75 mcg total) by mouth daily before breakfast.  . losartan (COZAAR) 50 MG tablet Take 50 mg by mouth daily.   . nitrofurantoin (MACRODANTIN) 50 MG capsule Take 1 capsule (50 mg total) by mouth daily.  .Marland Kitchennystatin (MYCOSTATIN/NYSTOP) powder Apply 100,000 g topically 2 (two) times daily as needed.   . nystatin cream (MYCOSTATIN) Apply 1 application topically as needed for dry skin.  .Marland Kitchenomeprazole (PRILOSEC) 40 MG capsule Take 40 mg by mouth daily.  . polyethylene glycol (MIRALAX / GLYCOLAX) 17 g packet Take 17 g by mouth as directed. On Monday and Thursday  . sertraline (ZOLOFT) 25 MG tablet Take 75 mg by mouth daily. 3 tablets=75 mg daily  . torsemide (DEMADEX) 20 MG tablet Take 20 mg by mouth daily.  . traMADol (ULTRAM) 50 MG tablet Take 75 mg by mouth every 6 (six) hours as needed.   No facility-administered encounter medications on file as of 11/17/2020.    Review of Systems  Constitutional: Negative for  activity change, appetite change, fatigue and fever.  Respiratory: Positive for shortness of breath. Negative for cough and wheezing.        Sob with exertion  Cardiovascular: Positive for leg swelling. Negative for chest pain.  Genitourinary: Positive for frequency. Negative for dysuria and urgency.  Skin: Negative.   Psychiatric/Behavioral: Negative for dysphoric mood. The patient is not nervous/anxious.     Immunization History  Administered Date(s) Administered  . Influenza, High Dose Seasonal PF 04/26/2016, 03/22/2017, 05/08/2019  . Influenza,inj,Quad PF,6+ Mos 04/27/2018  . Influenza-Unspecified 04/22/2011, 04/04/2012, 05/03/2013, 05/04/2013, 04/17/2014, 04/19/2015, 04/26/2016, 04/22/2017  . Moderna Sars-Covid-2 Vaccination 07/30/2019, 08/27/2019  .  Pneumococcal Polysaccharide-23 08/26/2003, 09/06/2011, 05/15/2019  . Pneumococcal-Unspecified 12/20/2013  . Td 09/26/2006  . Tdap 05/15/2019  . Zoster 10/25/2007  . Zoster Recombinat (Shingrix) 11/21/2017, 04/08/2018   Pertinent  Health Maintenance Due  Topic Date Due  . PNA vac Low Risk Adult (2 of 2 - PCV13) 05/14/2020  . INFLUENZA VACCINE  02/23/2021  . DEXA SCAN  Completed   Fall Risk  10/18/2018 08/23/2018 05/09/2018 05/01/2018 04/10/2018  Falls in the past year? 0 0 Yes Yes No  Comment - - - fell in June/2019 _0  Home (asst living) -  Number falls in past yr: 0 0 1 1 -  Injury with Fall? 0 0 No No -  Risk for fall due to : - - - Impaired balance/gait -  Follow up - - - Falls prevention discussed -   Functional Status Survey:    Vitals:   11/17/20 1116  BP: 137/66  Pulse: 60  Resp: 20  Temp: (!) 97.5 F (36.4 C)  SpO2: 92%  Weight: 181 lb 12.8 oz (82.5 kg)  Height: 5' 4" (1.626 m)   Body mass index is 31.21 kg/m. Physical Exam Vitals reviewed.  Constitutional:      General: She is not in acute distress. HENT:     Head: Normocephalic.  Cardiovascular:     Rate and Rhythm: Normal rate and regular  rhythm.     Pulses: Normal pulses.     Heart sounds: Murmur heard.    Pulmonary:     Effort: Pulmonary effort is normal. No respiratory distress.     Breath sounds: Normal breath sounds. No wheezing.  Abdominal:     General: Bowel sounds are normal. There is no distension.     Palpations: Abdomen is soft.     Tenderness: There is no abdominal tenderness.  Musculoskeletal:     Right lower leg: Edema present.     Left lower leg: Edema present.     Comments: 1+ pitting  Skin:    General: Skin is warm and dry.     Capillary Refill: Capillary refill takes less than 2 seconds.  Neurological:     General: No focal deficit present.     Mental Status: She is alert. Mental status is at baseline.     Motor: Weakness present.     Gait: Gait abnormal.  Psychiatric:        Mood and Affect: Mood normal.        Behavior: Behavior normal.     Labs reviewed: Recent Labs    04/10/20 0000 06/05/20 0000 10/16/20 1804  NA 141 137 139  K 4.1 4.0 4.2  CL 104 100 105  CO2 30* 29* 26  GLUCOSE  --   --  111*  BUN 31* 27* 35*  CREATININE 1.3* 1.4* 1.38*  CALCIUM 9.7 9.6 9.7   Recent Labs    04/10/20 0000 06/05/20 0000  AST 14 20  ALT 8 14  ALKPHOS 85 94  ALBUMIN 4.0 3.9   Recent Labs    04/10/20 0000 06/05/20 0000 10/16/20 1804  WBC 3.4 4.4 8.5  NEUTROABS 1,510.00 2,213.00  --   HGB 10.0* 9.7* 10.1*  HCT 32* 31* 33.7*  MCV  --   --  89.4  PLT 92* 77* 97*   Lab Results  Component Value Date   TSH 4.69 06/05/2020   No results found for: HGBA1C Lab Results  Component Value Date   CHOL 154 04/10/2020   HDL 47 04/10/2020   LDLCALC  87 04/10/2020   TRIG 108 04/10/2020   CHOLHDL 3.8 11/23/2018    Significant Diagnostic Results in last 30 days:  No results found.  Assessment/Plan 1. Bilateral leg edema - 1+ pitting edema to bilateral ankles, no increased sob - due to CKD stage 3, will recheck creatinine, 1.5 03/28 - recommend low sodium diet - recommend lower leg  elevation in recliner 2-3 hours daily - continue compression stockings daily - will increase torsemide to 40 mg po daily- give 04/26 and 04/27    Family/ staff Communication: plan discussed with patient and nurse  Labs/tests ordered:  Bmp in 1 week

## 2020-11-18 DIAGNOSIS — R2681 Unsteadiness on feet: Secondary | ICD-10-CM | POA: Diagnosis not present

## 2020-11-18 DIAGNOSIS — M6281 Muscle weakness (generalized): Secondary | ICD-10-CM | POA: Diagnosis not present

## 2020-11-18 DIAGNOSIS — Z9181 History of falling: Secondary | ICD-10-CM | POA: Diagnosis not present

## 2020-11-19 DIAGNOSIS — M6281 Muscle weakness (generalized): Secondary | ICD-10-CM | POA: Diagnosis not present

## 2020-11-19 DIAGNOSIS — R2681 Unsteadiness on feet: Secondary | ICD-10-CM | POA: Diagnosis not present

## 2020-11-19 DIAGNOSIS — Z9181 History of falling: Secondary | ICD-10-CM | POA: Diagnosis not present

## 2020-11-24 DIAGNOSIS — M6281 Muscle weakness (generalized): Secondary | ICD-10-CM | POA: Diagnosis not present

## 2020-11-24 DIAGNOSIS — R2681 Unsteadiness on feet: Secondary | ICD-10-CM | POA: Diagnosis not present

## 2020-11-24 DIAGNOSIS — I10 Essential (primary) hypertension: Secondary | ICD-10-CM | POA: Diagnosis not present

## 2020-11-24 DIAGNOSIS — Z9181 History of falling: Secondary | ICD-10-CM | POA: Diagnosis not present

## 2020-11-26 DIAGNOSIS — Z9181 History of falling: Secondary | ICD-10-CM | POA: Diagnosis not present

## 2020-11-26 DIAGNOSIS — R2681 Unsteadiness on feet: Secondary | ICD-10-CM | POA: Diagnosis not present

## 2020-11-26 DIAGNOSIS — M6281 Muscle weakness (generalized): Secondary | ICD-10-CM | POA: Diagnosis not present

## 2020-12-01 DIAGNOSIS — M6281 Muscle weakness (generalized): Secondary | ICD-10-CM | POA: Diagnosis not present

## 2020-12-01 DIAGNOSIS — Z9181 History of falling: Secondary | ICD-10-CM | POA: Diagnosis not present

## 2020-12-01 DIAGNOSIS — R2681 Unsteadiness on feet: Secondary | ICD-10-CM | POA: Diagnosis not present

## 2020-12-03 DIAGNOSIS — M6281 Muscle weakness (generalized): Secondary | ICD-10-CM | POA: Diagnosis not present

## 2020-12-03 DIAGNOSIS — R2681 Unsteadiness on feet: Secondary | ICD-10-CM | POA: Diagnosis not present

## 2020-12-03 DIAGNOSIS — Z9181 History of falling: Secondary | ICD-10-CM | POA: Diagnosis not present

## 2021-01-06 ENCOUNTER — Encounter: Payer: Self-pay | Admitting: Nurse Practitioner

## 2021-01-06 ENCOUNTER — Non-Acute Institutional Stay: Payer: Medicare Other | Admitting: Nurse Practitioner

## 2021-01-06 DIAGNOSIS — K5909 Other constipation: Secondary | ICD-10-CM | POA: Diagnosis not present

## 2021-01-06 DIAGNOSIS — D638 Anemia in other chronic diseases classified elsewhere: Secondary | ICD-10-CM

## 2021-01-06 DIAGNOSIS — I739 Peripheral vascular disease, unspecified: Secondary | ICD-10-CM

## 2021-01-06 DIAGNOSIS — N3090 Cystitis, unspecified without hematuria: Secondary | ICD-10-CM

## 2021-01-06 DIAGNOSIS — G8929 Other chronic pain: Secondary | ICD-10-CM

## 2021-01-06 DIAGNOSIS — N1831 Chronic kidney disease, stage 3a: Secondary | ICD-10-CM | POA: Diagnosis not present

## 2021-01-06 DIAGNOSIS — M545 Low back pain, unspecified: Secondary | ICD-10-CM | POA: Diagnosis not present

## 2021-01-06 DIAGNOSIS — K644 Residual hemorrhoidal skin tags: Secondary | ICD-10-CM

## 2021-01-06 DIAGNOSIS — F418 Other specified anxiety disorders: Secondary | ICD-10-CM

## 2021-01-06 DIAGNOSIS — E039 Hypothyroidism, unspecified: Secondary | ICD-10-CM | POA: Diagnosis not present

## 2021-01-06 DIAGNOSIS — I10 Essential (primary) hypertension: Secondary | ICD-10-CM

## 2021-01-06 DIAGNOSIS — K219 Gastro-esophageal reflux disease without esophagitis: Secondary | ICD-10-CM

## 2021-01-06 NOTE — Assessment & Plan Note (Signed)
takes Omeprazole 40mg  qd

## 2021-01-06 NOTE — Assessment & Plan Note (Signed)
takes Levothyroxine qd, TSH 4.69 06/05/20

## 2021-01-06 NOTE — Assessment & Plan Note (Signed)
Avoid constipation 

## 2021-01-06 NOTE — Assessment & Plan Note (Signed)
takes Gabapentin 100mg  tid, prn Methocarbamol. 10/16/20 X-ray Vertebroplasty of T9, T11, L1, and L2 have been performed with stable compression deformities

## 2021-01-06 NOTE — Progress Notes (Signed)
Location:   [name]: QF90 Place of Service:  ALF (13) Provider: Lennie Odor Dyllin Gulley NP  Virgie Dad, MD  Patient Care Team: Virgie Dad, MD as PCP - General (Internal Medicine) Shannah Conteh X, NP as Nurse Practitioner (Internal Medicine)  Extended Emergency Contact Information Primary Emergency Contact: Nabers,[name] Address: [address]          [place], [place] [address] [place] Phone: [phone] Mobile Phone: [phone] Relation: Son Secondary Emergency Contact: Baskett, Kathi Der [place] of [place] Phone: [phone] Mobile Phone: [phone] Relation: Other  Code Status:  DNR Goals of care: Advanced Directive information Advanced Directives 01/06/2021  Does Patient Have a Medical Advance Directive? Yes  Type of Paramedic of [place];Out of facility DNR (pink MOST or yellow form)  Does patient want to make changes to medical advance directive? No - Patient declined  Copy of [place] in Chart? Yes - validated most recent copy scanned in chart (See row information)  Pre-existing out of facility DNR order (yellow form or pink MOST form) Yellow form placed in chart (order not valid for inpatient use)     Chief Complaint  Patient presents with   Medical Management of Chronic Issues    Routine follow up.    Health Maintenance    Discuss need for PNA vaccine.      HPI:  Pt is a 87 y.o. female seen today for medical management of chronic diseases.   CKD, stage 3             Hx of recurrent UTI, currently on Nitrofurantoin 6m qd for UTI suppression therapy.             HTN, blood pressure is controlled on Losartan 554mqd.                      Hemorrhoids, no injury.                           No constipation while taking prn MiraLax, Fiber daily, Colace bid.              Anemia, takes Fe 3x/wk, stable.             Lower back pain, takes Gabapentin 10050mid, prn Methocarbamol.  10/16/20 X-ray Vertebroplasty of T9, T11, L1, and L2 have been performed with stable compression deformities             Hypothyroidism, takes Levothyroxine 44m4md, TSH 4.69 06/05/20             GERD, takes Omeprazole 40mg12m            Edema BLE, minimal, takes Torsemide 40mg 42mince 11/17/20  Depression, takes Sertraline, Bupropion, Alprazolam       Past Medical History:  Diagnosis Date   Anemia    Anxiety    Chronotropic incompetence 12/2012    potentially medication related; noted on CPET    DDD (degenerative disc disease)    Eczema    GERD (gastroesophageal reflux disease)    H/O hiatal hernia    Hx: UTI (urinary tract infection)    Hyperlipidemia    Hypertension    Hypothyroidism    Osteopenia    Septicemia ([facility]) [place]   following UTI   Vertigo, benign positional    Past Surgical History:  Procedure Laterality Date  BREAST SURGERY     left biopsy   CATARACT EXTRACTION Right    2 weeks ago   CPET / MET - PFTS     Consistent with chronotropic incompetence; See attached report in the results section   DILATION AND CURETTAGE OF UTERUS     x 2   DOPPLER ECHOCARDIOGRAPHY  01/10/2013   Normal LV size and function. Normal EF. Air sclerosis but no stenosis   ESOPHAGOGASTRODUODENOSCOPY  05/04/2012   Procedure: ESOPHAGOGASTRODUODENOSCOPY (EGD);  Surgeon: Inda Castle, MD;  Location: [facility];  Service: Endoscopy;  Laterality: N/A;   EYE SURGERY     cataract extraction with ILO  ? eye   IR KYPHO EA ADDL LEVEL THORACIC OR LUMBAR  10/28/2016   IR KYPHO LUMBAR INC FX REDUCE BONE BX UNI/BIL CANNULATION INC/IMAGING  10/28/2016   IR KYPHO THORACIC WITH BONE BIOPSY  12/24/2016   IR RADIOLOGIST EVAL & MGMT  11/11/2016   KNEE ARTHROSCOPY  2011   right   TONSILLECTOMY     TOTAL KNEE ARTHROPLASTY  04/24/2012   Procedure: TOTAL KNEE ARTHROPLASTY;  Surgeon: Gearlean Alf, MD;  Location: [facility];  Service: Orthopedics;  Laterality: Right;   TRANSTHORACIC ECHOCARDIOGRAM  02/2018    Ordered by PCP for "congestive heart failure " -EF 60 M 65%.  GR 1 DD.  No R WMA--- > ESSENTIALLY NORMAL    No Known Allergies  Allergies as of 01/06/2021   No Known Allergies      Medication List        Accurate as of January 06, 2021 11:59 PM. If you have any questions, ask your nurse or doctor.          STOP taking these medications    Fiber Powd Stopped by: Smith Potenza X Chrisa Hassan, NP   levothyroxine 75 MCG tablet Commonly known as: SYNTHROID Stopped by: Cleora Karnik X Chancey Ringel, NP       TAKE these medications    acetaminophen 500 MG tablet Commonly known as: TYLENOL Take 1,000 mg by mouth 3 (three) times daily as needed.   ALPRAZolam 0.25 MG tablet Commonly known as: XANAX Take 1 tablet (0.25 mg total) by mouth at bedtime.   ARTIFICIAL TEARS OP Apply 1 drop to eye 3 (three) times daily.   atorvastatin 10 MG tablet Commonly known as: LIPITOR Take 5 mg by mouth daily.   buPROPion 150 MG 12 hr tablet Commonly known as: WELLBUTRIN SR Take 1 tablet (150 mg total) by mouth 2 (two) times daily after a meal.   Camphor-Menthol-Methyl Sal 3.07-31-08 % Ptch Place 1 patch onto the skin. Apply one to lower back daily for back pain   CITRACAL +D3 PO Take 2 capsules by mouth 2 (two) times a day. $Remo'650mg'oaEEY$ -9mcg(1000u)   docusate sodium 100 MG capsule Commonly known as: COLACE Take 100 mg by mouth 2 (two) times daily.   ferrous sulfate 325 (65 FE) MG EC tablet Take 325 mg by mouth every Monday, Wednesday, and Friday. With a meal   fish oil-omega-3 fatty acids 1000 MG capsule Take 1 g by mouth daily.   gabapentin 100 MG capsule Commonly known as: NEURONTIN Take 100 mg by mouth 3 (three) times daily.   hydrocortisone cream 1 % Apply 1 application topically daily as needed for itching. PRN to urogenital itchy area X 10 days than PRN   Klor-Con M20 20 MEQ tablet Generic drug: potassium chloride SA TAKE 1 TABLET BY MOUTH TWICE A DAY   losartan 50 MG tablet Commonly  known as: COZAAR Take  50 mg by mouth daily.   nitrofurantoin 50 MG capsule Commonly known as: MACRODANTIN Take 1 capsule (50 mg total) by mouth daily.   nystatin powder Commonly known as: MYCOSTATIN/NYSTOP Apply 100,000 g topically 2 (two) times daily as needed.   nystatin cream Commonly known as: MYCOSTATIN Apply 1 application topically as needed for dry skin.   omeprazole 40 MG capsule Commonly known as: PRILOSEC Take 40 mg by mouth daily.   polyethylene glycol 17 g packet Commonly known as: MIRALAX / GLYCOLAX Take 17 g by mouth as directed. On Monday and Thursday   sertraline 25 MG tablet Commonly known as: ZOLOFT Take 75 mg by mouth daily. 3 tablets=75 mg daily   torsemide 20 MG tablet Commonly known as: DEMADEX Take 20 mg by mouth daily.   traMADol 50 MG tablet Commonly known as: ULTRAM Take 75 mg by mouth every 6 (six) hours as needed.        Review of Systems  Constitutional:  Negative for fatigue, fever and unexpected weight change.       Chronic fatigue.   HENT:  Positive for hearing loss. Negative for congestion and voice change.   Eyes:  Negative for visual disturbance.  Respiratory:  Positive for shortness of breath. Negative for cough and wheezing.        DOE  Cardiovascular:  Positive for leg swelling. Negative for palpitations.  Gastrointestinal:  Negative for abdominal pain and constipation.  Genitourinary:  Negative for dysuria and urgency.       Urinary leakage.   Musculoskeletal:  Positive for arthralgias, back pain and gait problem. Negative for joint swelling.       R hip pain, lower back pain. Unsteady gait. Frequent falls.   Skin:  Negative for color change.  Neurological:  Negative for speech difficulty, weakness and headaches.       Hx of vertigo, chronic. Memory lapses.   Psychiatric/Behavioral:  Negative for behavioral problems and sleep disturbance. The patient is not nervous/anxious.    Immunization History  Administered Date(s) Administered    Influenza, High Dose Seasonal PF 04/26/2016, 03/22/2017, 05/08/2019   Influenza,inj,Quad PF,6+ Mos 04/27/2018   Influenza-Unspecified 04/22/2011, 04/04/2012, 05/03/2013, 05/04/2013, 04/17/2014, 04/19/2015, 04/26/2016, 04/22/2017   Moderna SARS-COV2 Booster Vaccination 12/31/2020   Moderna Sars-Covid-2 Vaccination 07/30/2019, 08/27/2019   Pneumococcal Polysaccharide-23 08/26/2003, 09/06/2011, 05/15/2019   Pneumococcal-Unspecified 12/20/2013   Td 09/26/2006   Tdap 05/15/2019   Zoster Recombinat (Shingrix) 11/21/2017, 04/08/2018   Zoster, Live 10/25/2007   Pertinent  Health Maintenance Due  Topic Date Due   PNA vac Low Risk Adult (2 of 2 - PCV13) 05/14/2020   INFLUENZA VACCINE  02/23/2021   DEXA SCAN  Completed   Fall Risk  10/18/2018 08/23/2018 05/09/2018 05/01/2018 04/10/2018  Falls in the past year? 0 0 Yes Yes No  Comment - - - fell in June/2019 $RemoveBefo'@Friends'eGAzzyZfllk$  Home (asst living) -  Number falls in past yr: 0 0 1 1 -  Injury with Fall? 0 0 No No -  Risk for fall due to : - - - Impaired balance/gait -  Follow up - - - Falls prevention discussed -   Functional Status Survey:    Vitals:   01/06/21 1545  BP: 118/70  Pulse: (!) 59  Resp: 18  Temp: (!) 96.9 F (36.1 C)  SpO2: 90%  Weight: 174 lb 12.8 oz (79.3 kg)  Height: $Remove'5\' 4"'MhNSfxq$  (1.626 m)   Body mass index is 30 kg/m. Physical Exam Vitals and  nursing note reviewed.  Constitutional:      Appearance: Normal appearance.  HENT:     Head: Normocephalic and atraumatic.     Mouth/Throat:     Mouth: Mucous membranes are moist.  Eyes:     Extraocular Movements: Extraocular movements intact.     Conjunctiva/sclera: Conjunctivae normal.     Pupils: Pupils are equal, round, and reactive to light.  Cardiovascular:     Rate and Rhythm: Normal rate and regular rhythm.     Heart sounds: No murmur heard. Pulmonary:     Breath sounds: No wheezing.  Abdominal:     General: Bowel sounds are normal.     Palpations: Abdomen is soft.      Tenderness: There is no abdominal tenderness.  Genitourinary:    Comments: External hemorrhoid per previous examination.  Musculoskeletal:     Cervical back: Normal range of motion and neck supple.     Right lower leg: Edema present.     Left lower leg: Edema present.     Comments: Edema trace BLE. Chronic lower back, hip pain.   Skin:    General: Skin is warm and dry.  Neurological:     General: No focal deficit present.     Mental Status: She is alert. Mental status is at baseline.     Motor: No weakness.     Coordination: Coordination normal.     Gait: Gait abnormal.     Comments: Oriented to person, place.   Psychiatric:        Mood and Affect: Mood normal.        Behavior: Behavior normal.        Thought Content: Thought content normal.    Labs reviewed: Recent Labs    04/10/20 0000 06/05/20 0000 10/16/20 1804  NA 141 137 139  K 4.1 4.0 4.2  CL 104 100 105  CO2 30* 29* 26  GLUCOSE  --   --  111*  BUN 31* 27* 35*  CREATININE 1.3* 1.4* 1.38*  CALCIUM 9.7 9.6 9.7   Recent Labs    04/10/20 0000 06/05/20 0000  AST 14 20  ALT 8 14  ALKPHOS 85 94  ALBUMIN 4.0 3.9   Recent Labs    04/10/20 0000 06/05/20 0000 10/16/20 1804  WBC 3.4 4.4 8.5  NEUTROABS 1,510.00 2,213.00  --   HGB 10.0* 9.7* 10.1*  HCT 32* 31* 33.7*  MCV  --   --  89.4  PLT 92* 77* 97*   Lab Results  Component Value Date   TSH 4.69 06/05/2020   No results found for: HGBA1C Lab Results  Component Value Date   CHOL 154 04/10/2020   HDL 47 04/10/2020   LDLCALC 87 04/10/2020   TRIG 108 04/10/2020   CHOLHDL 3.8 11/23/2018    Significant Diagnostic Results in last 30 days:  No results found.  Assessment/Plan Anemia of chronic disease  takes Fe 3x/wk, stable.  Chronic back pain takes Gabapentin 146m tid, prn Methocarbamol. 10/16/20 X-ray Vertebroplasty of T9, T11, L1, and L2 have been performed with stable compression deformities  Acquired hypothyroidism takes Levothyroxine 759m  qd, TSH 4.69 06/05/20  GERD (gastroesophageal reflux disease) takes Omeprazole 4045md  PVD (peripheral vascular disease) (HCC) Edema BLE, minimal, takes Torsemide 6m82m since 11/17/20  Depression with anxiety  takes Sertraline, Bupropion, Alprazolam  Chronic constipation No constipation while taking prn MiraLax, Fiber daily, Colace bid  Essential hypertension blood pressure is controlled on Losartan 50mg43m  External hemorrhoids Avoid constipation.   Recurrent cystitis currently on Nitrofurantoin 33m qd for UTI suppression therapy.  CKD (chronic kidney disease) stage 3, GFR 30-59 ml/min (HCC) Stage 3, chronic diuretic use is contributory.    Family/ staff Communication: plan of care reviewed with the patient and charge nurse.   Labs/tests ordered:  none   Time spend 40 minutes.

## 2021-01-06 NOTE — Assessment & Plan Note (Signed)
blood pressure is controlled on Losartan 50mg  qd.

## 2021-01-06 NOTE — Assessment & Plan Note (Signed)
Stage 3, chronic diuretic use is contributory.

## 2021-01-06 NOTE — Assessment & Plan Note (Signed)
No constipation while taking prn MiraLax, Fiber daily, Colace bid

## 2021-01-06 NOTE — Assessment & Plan Note (Signed)
takes Fe 3x/wk, stable.

## 2021-01-06 NOTE — Assessment & Plan Note (Signed)
currently on Nitrofurantoin 50mg  qd for UTI suppression therapy.

## 2021-01-06 NOTE — Assessment & Plan Note (Signed)
Edema BLE, minimal, takes Torsemide 40mg  qd since 11/17/20

## 2021-01-06 NOTE — Assessment & Plan Note (Signed)
takes Sertraline, Bupropion, Alprazolam

## 2021-01-07 ENCOUNTER — Encounter: Payer: Self-pay | Admitting: Nurse Practitioner

## 2021-01-30 ENCOUNTER — Non-Acute Institutional Stay: Payer: Medicare Other | Admitting: Orthopedic Surgery

## 2021-01-30 ENCOUNTER — Encounter: Payer: Self-pay | Admitting: Orthopedic Surgery

## 2021-01-30 DIAGNOSIS — R251 Tremor, unspecified: Secondary | ICD-10-CM

## 2021-01-30 NOTE — Progress Notes (Signed)
Location:   [place] Room Number: 36 Place of Service:  ALF [phone]) Provider: Windell Moulding, NP  Virgie Dad, MD  Patient Care Team: Virgie Dad, MD as PCP - General (Internal Medicine) Mast, Man X, NP as Nurse Practitioner (Internal Medicine)  Extended Emergency Contact Information Primary Emergency Contact: Labrake,[name] Address: [address]          [place], [place] [address] [place] Phone: [phone] Mobile Phone: [phone] Relation: Son Secondary Emergency Contact: Span, Kathi Der [place] of [place] Phone: [phone] Mobile Phone: [phone] Relation: Other  Code Status:  DNR Managed Care Goals of care: Advanced Directive information Advanced Directives 01/30/2021  Does Patient Have a Medical Advance Directive? Yes  Type of Paramedic of [place];Out of facility DNR (pink MOST or yellow form)  Does patient want to make changes to medical advance directive? No - Patient declined  Copy of [place] in Chart? Yes - validated most recent copy scanned in chart (See row information)  Pre-existing out of facility DNR order (yellow form or pink MOST form) Yellow form placed in chart (order not valid for inpatient use)     Chief Complaint  Patient presents with   Acute Visit    Hand tremor    HPI:  Pt is a 87 y.o. female seen today for an acute visit for hand tremors.   She currently resides in assisted living at [facility]. Past medical history includes: HTN, PVD, HLD, constipation, GERD, hypothyroidism, OA, anemia, and depression.   Nursing staff reports patient having trouble taking medications this morning due to hand tremor. She has had a intermittent hand tremor for years. Patient reports not eating in the dinning room with others because she is embarrassed. Today, I observed her eat > 50% of her meal without difficulty. She was able to pick up a glass and drink  fluids without difficulty. She takes her pills whole in applesauce. She had difficulty using the spoon to scoop out her pills and take them. She denies pain, paresthesias or weakness.   Nurse does not report any other concerns, vitals stable.     Past Medical History:  Diagnosis Date   Anemia    Anxiety    Chronotropic incompetence 12/2012    potentially medication related; noted on CPET    DDD (degenerative disc disease)    Eczema    GERD (gastroesophageal reflux disease)    H/O hiatal hernia    Hx: UTI (urinary tract infection)    Hyperlipidemia    Hypertension    Hypothyroidism    Osteopenia    Septicemia ([place]) 2002   following UTI   Vertigo, benign positional    Past Surgical History:  Procedure Laterality Date   BREAST SURGERY     left biopsy   CATARACT EXTRACTION Right    2 weeks ago   CPET / MET - PFTS     Consistent with chronotropic incompetence; See attached report in the results section   DILATION AND CURETTAGE OF UTERUS     x 2   DOPPLER ECHOCARDIOGRAPHY  01/10/2013   Normal LV size and function. Normal EF. Air sclerosis but no stenosis   ESOPHAGOGASTRODUODENOSCOPY  05/04/2012   Procedure: ESOPHAGOGASTRODUODENOSCOPY (EGD);  Surgeon: Inda Castle, MD;  Location: [facility];  Service: Endoscopy;  Laterality: N/A;   EYE SURGERY     cataract extraction with ILO  ? eye   IR KYPHO  EA ADDL LEVEL THORACIC OR LUMBAR  10/28/2016   IR KYPHO LUMBAR INC FX REDUCE BONE BX UNI/BIL CANNULATION INC/IMAGING  10/28/2016   IR KYPHO THORACIC WITH BONE BIOPSY  12/24/2016   IR RADIOLOGIST EVAL & MGMT  11/11/2016   KNEE ARTHROSCOPY  2011   right   TONSILLECTOMY     TOTAL KNEE ARTHROPLASTY  04/24/2012   Procedure: TOTAL KNEE ARTHROPLASTY;  Surgeon: Gearlean Alf, MD;  Location: [facility];  Service: Orthopedics;  Laterality: Right;   TRANSTHORACIC ECHOCARDIOGRAM  02/2018   Ordered by PCP for "congestive heart failure " -EF 60 M 65%.  GR 1 DD.  No R WMA--- > ESSENTIALLY NORMAL    No  Known Allergies  Allergies as of 01/30/2021   No Known Allergies      Medication List        Accurate as of January 30, 2021  4:24 PM. If you have any questions, ask your nurse or doctor.          acetaminophen 500 MG tablet Commonly known as: TYLENOL Take 1,000 mg by mouth 3 (three) times daily as needed.   ALPRAZolam 0.25 MG tablet Commonly known as: XANAX Take 1 tablet (0.25 mg total) by mouth at bedtime.   ARTIFICIAL TEARS OP Apply 1 drop to eye 3 (three) times daily.   atorvastatin 10 MG tablet Commonly known as: LIPITOR Take 5 mg by mouth daily.   buPROPion 150 MG 12 hr tablet Commonly known as: WELLBUTRIN SR Take 1 tablet (150 mg total) by mouth 2 (two) times daily after a meal.   Camphor-Menthol-Methyl Sal 3.07-31-08 % Ptch Place 1 patch onto the skin. Apply one to lower back daily for back pain   CITRACAL +D3 PO Take 2 capsules by mouth 2 (two) times a day. 684m-25mcg(1000u)   docusate sodium 100 MG capsule Commonly known as: COLACE Take 100 mg by mouth 2 (two) times daily.   ferrous sulfate 325 (65 FE) MG EC tablet Take 325 mg by mouth every Monday, Wednesday, and Friday. With a meal   fish oil-omega-3 fatty acids 1000 MG capsule Take 1 g by mouth daily.   gabapentin 100 MG capsule Commonly known as: NEURONTIN Take 100 mg by mouth 3 (three) times daily.   hydrocortisone cream 1 % Apply 1 application topically daily as needed for itching. PRN to urogenital itchy area X 10 days than PRN   Klor-Con M20 20 MEQ tablet Generic drug: potassium chloride SA TAKE 1 TABLET BY MOUTH TWICE A DAY   losartan 50 MG tablet Commonly known as: COZAAR Take 50 mg by mouth daily.   nitrofurantoin 50 MG capsule Commonly known as: MACRODANTIN Take 1 capsule (50 mg total) by mouth daily.   nystatin powder Commonly known as: MYCOSTATIN/NYSTOP Apply 100,000 g topically 2 (two) times daily as needed.   nystatin cream Commonly known as: MYCOSTATIN Apply 1  application topically as needed for dry skin.   omeprazole 40 MG capsule Commonly known as: PRILOSEC Take 40 mg by mouth daily.   polyethylene glycol 17 g packet Commonly known as: MIRALAX / GLYCOLAX Take 17 g by mouth as directed. On Monday and Thursday   sertraline 25 MG tablet Commonly known as: ZOLOFT Take 75 mg by mouth daily. 3 tablets=75 mg daily   torsemide 20 MG tablet Commonly known as: DEMADEX Take 20 mg by mouth daily.   traMADol 50 MG tablet Commonly known as: ULTRAM Take 75 mg by mouth every 6 (six) hours as needed.  Review of Systems  Constitutional:  Negative for activity change, appetite change, fatigue and fever.  Respiratory:  Negative for cough, shortness of breath and wheezing.   Cardiovascular:  Negative for chest pain and leg swelling.  Neurological:  Positive for tremors. Negative for dizziness, weakness, numbness and headaches.  Psychiatric/Behavioral:  Negative for dysphoric mood. The patient is not nervous/anxious.    Immunization History  Administered Date(s) Administered   Influenza, High Dose Seasonal PF 04/26/2016, 03/22/2017, 05/08/2019   Influenza,inj,Quad PF,6+ Mos 04/27/2018   Influenza-Unspecified 04/22/2011, 04/04/2012, 05/03/2013, 05/04/2013, 04/17/2014, 04/19/2015, 04/26/2016, 04/22/2017   Moderna SARS-COV2 Booster Vaccination 12/31/2020   Moderna Sars-Covid-2 Vaccination 07/30/2019, 08/27/2019   Pneumococcal Polysaccharide-23 08/26/2003, 09/06/2011, 05/15/2019   Pneumococcal-Unspecified 12/20/2013   Td 09/26/2006   Tdap 05/15/2019   Zoster Recombinat (Shingrix) 11/21/2017, 04/08/2018   Zoster, Live 10/25/2007   Pertinent  Health Maintenance Due  Topic Date Due   PNA vac Low Risk Adult (2 of 2 - PCV13) 05/14/2020   INFLUENZA VACCINE  02/23/2021   DEXA SCAN  Completed   Fall Risk  10/18/2018 08/23/2018 05/09/2018 05/01/2018 04/10/2018  Falls in the past year? 0 0 Yes Yes No  Comment - - - fell in June/2019 _0  Home  (asst living) -  Number falls in past yr: 0 0 1 1 -  Injury with Fall? 0 0 No No -  Risk for fall due to : - - - Impaired balance/gait -  Follow up - - - Falls prevention discussed -   Functional Status Survey:    Vitals:   01/30/21 1619  BP: 104/79  Pulse: 65  Resp: 16  Temp: (!) 96.7 F (35.9 C)  SpO2: 93%  Weight: 169 lb 12.8 oz (77 kg)  Height: _1  (1.626 m)   Body mass index is 29.15 kg/m. Physical Exam Vitals reviewed.  Constitutional:      General: She is not in acute distress. HENT:     Head: Normocephalic.  Eyes:     General:        Right eye: No discharge.        Left eye: No discharge.     Extraocular Movements: Extraocular movements intact.     Pupils: Pupils are equal, round, and reactive to light.  Cardiovascular:     Rate and Rhythm: Normal rate and regular rhythm.     Pulses: Normal pulses.     Heart sounds: Normal heart sounds. No murmur heard. Pulmonary:     Effort: Pulmonary effort is normal. No respiratory distress.     Breath sounds: Normal breath sounds. No wheezing.  Musculoskeletal:     Cervical back: Normal range of motion.  Lymphadenopathy:     Cervical: No cervical adenopathy.  Skin:    General: Skin is warm and dry.     Capillary Refill: Capillary refill takes less than 2 seconds.  Neurological:     General: No focal deficit present.     Mental Status: She is alert and oriented to person, place, and time.     Sensory: Sensation is intact.     Motor: Tremor present.     Coordination: Coordination is intact.     Gait: Gait abnormal.     Comments: Hand tremor observed holding eating utensils.   Psychiatric:        Mood and Affect: Mood normal.        Behavior: Behavior normal.    Labs reviewed: Recent Labs    04/10/20 0000 06/05/20 0000 10/16/20 1804  NA 141 137 139  K 4.1 4.0 4.2  CL 104 100 105  CO2 30* 29* 26  GLUCOSE  --   --  111*  BUN 31* 27* 35*  CREATININE 1.3* 1.4* 1.38*  CALCIUM 9.7 9.6 9.7   Recent Labs     04/10/20 0000 06/05/20 0000  AST 14 20  ALT 8 14  ALKPHOS 85 94  ALBUMIN 4.0 3.9   Recent Labs    04/10/20 0000 06/05/20 0000 10/16/20 1804  WBC 3.4 4.4 8.5  NEUTROABS 1,510.00 2,213.00  --   HGB 10.0* 9.7* 10.1*  HCT 32* 31* 33.7*  MCV  --   --  89.4  PLT 92* 77* 97*   Lab Results  Component Value Date   TSH 4.69 06/05/2020   No results found for: HGBA1C Lab Results  Component Value Date   CHOL 154 04/10/2020   HDL 47 04/10/2020   LDLCALC 87 04/10/2020   TRIG 108 04/10/2020   CHOLHDL 3.8 11/23/2018    Significant Diagnostic Results in last 30 days:  No results found.  Assessment/Plan 1. Tremor of both hands - some difficulty using fork and spoon - she is able to drink without difficulty - cbc/diff - cmp - recommend OT to evaluate    Family/ staff Communication: plan discussed with patient and nurse  Labs/tests ordered:   cbc/diff, cmp

## 2021-02-02 DIAGNOSIS — D649 Anemia, unspecified: Secondary | ICD-10-CM | POA: Diagnosis not present

## 2021-02-16 ENCOUNTER — Encounter: Payer: Self-pay | Admitting: Orthopedic Surgery

## 2021-02-16 ENCOUNTER — Non-Acute Institutional Stay: Payer: Medicare Other | Admitting: Orthopedic Surgery

## 2021-02-16 DIAGNOSIS — I10 Essential (primary) hypertension: Secondary | ICD-10-CM | POA: Diagnosis not present

## 2021-02-16 DIAGNOSIS — R5383 Other fatigue: Secondary | ICD-10-CM

## 2021-02-16 NOTE — Progress Notes (Signed)
Location:   [place] Room Number: [place] of Service:  ALF [phone]) Provider:  Windell Moulding, NP    Patient Care Team: Virgie Dad, MD as PCP - General (Internal Medicine) Mast, Man X, NP as Nurse Practitioner (Internal Medicine)  Extended Emergency Contact Information Primary Emergency Contact: Postell,[name] Address: [address]          [place], [place] [address] [place] Phone: [phone] Mobile Phone: [phone] Relation: Son Secondary Emergency Contact: [name] [place] of [place] Phone: [phone] Mobile Phone: [phone] Relation: Other  Code Status:  DNR Goals of care: Advanced Directive information Advanced Directives 02/16/2021  Does Patient Have a Medical Advance Directive? Yes  Type of Paramedic of [place];Out of facility DNR (pink MOST or yellow form)  Does patient want to make changes to medical advance directive? No - Patient declined  Copy of [place] in Chart? Yes - validated most recent copy scanned in chart (See row information)  Pre-existing out of facility DNR order (yellow form or pink MOST form) Yellow form placed in chart (order not valid for inpatient use)     Chief Complaint  Patient presents with   Acute Visit    Patient presents for low blood pressure    HPI:  Pt is a 87 y.o. female seen today for an acute visit for low blood pressure and fatigue.   She currently resides in assisted living at [facility]. Past medical history includes: HTN, PVD, HLD, constipation, GERD, hypothyroidism, OA, anemia, and depression.  In the past few days, nursing has noticed blood pressures with SBP < 120. She takes torsemide 20 mg daily and losartan 50 mg for hypertension. She denies chest pain, sob, headaches, dizziness or lightheadedness. Remains on regular diet. BUN/creat 19/1.13 02/02/2021.  Recent blood pressures:  07/20- 111/52  07/13-  117/54  07/06- 104/79  06/29- 114/60  Hand tremors unchanged from last visit. Still able to eat and grasp things.   She reports increased fatigue today. Requesting to cancel PT later on. She reports feeling more tired and napping more during the day. She eventually did PT later on this morning. Receiving PT for transfers, strengthening and balance x 1 week. Ambulates with rollator.    No recent falls, injuries, or behavioral outbursts.   Nurse does not report any other concerns, vitals stable.   Past Medical History:  Diagnosis Date   Anemia    Anxiety    Chronotropic incompetence 12/2012    potentially medication related; noted on CPET    DDD (degenerative disc disease)    Eczema    GERD (gastroesophageal reflux disease)    H/O hiatal hernia    Hx: UTI (urinary tract infection)    Hyperlipidemia    Hypertension    Hypothyroidism    Osteopenia    Septicemia ([place]) 2002   following UTI   Vertigo, benign positional    Past Surgical History:  Procedure Laterality Date   BREAST SURGERY     left biopsy   CATARACT EXTRACTION Right    2 weeks ago   CPET / MET - PFTS     Consistent with chronotropic incompetence; See attached report in the results section   DILATION AND CURETTAGE OF UTERUS     x 2   DOPPLER ECHOCARDIOGRAPHY  01/10/2013   Normal LV size and function. Normal EF. Air sclerosis but no stenosis   ESOPHAGOGASTRODUODENOSCOPY  05/04/2012  Procedure: ESOPHAGOGASTRODUODENOSCOPY (EGD);  Surgeon: Inda Castle, MD;  Location: [facility];  Service: Endoscopy;  Laterality: N/A;   EYE SURGERY     cataract extraction with ILO  ? eye   IR KYPHO EA ADDL LEVEL THORACIC OR LUMBAR  10/28/2016   IR KYPHO LUMBAR INC FX REDUCE BONE BX UNI/BIL CANNULATION INC/IMAGING  10/28/2016   IR KYPHO THORACIC WITH BONE BIOPSY  12/24/2016   IR RADIOLOGIST EVAL & MGMT  11/11/2016   KNEE ARTHROSCOPY  2011   right   TONSILLECTOMY     TOTAL KNEE ARTHROPLASTY  04/24/2012   Procedure: TOTAL KNEE  ARTHROPLASTY;  Surgeon: Gearlean Alf, MD;  Location: [facility];  Service: Orthopedics;  Laterality: Right;   TRANSTHORACIC ECHOCARDIOGRAM  02/2018   Ordered by PCP for "congestive heart failure " -EF 60 M 65%.  GR 1 DD.  No R WMA--- > ESSENTIALLY NORMAL    No Known Allergies  Allergies as of 02/16/2021   No Known Allergies      Medication List        Accurate as of February 16, 2021 10:39 AM. If you have any questions, ask your nurse or doctor.          acetaminophen 500 MG tablet Commonly known as: TYLENOL Take 1,000 mg by mouth 3 (three) times daily as needed.   ALPRAZolam 0.25 MG tablet Commonly known as: XANAX Take 1 tablet (0.25 mg total) by mouth at bedtime.   ARTIFICIAL TEARS OP Apply 1 drop to eye 3 (three) times daily.   atorvastatin 10 MG tablet Commonly known as: LIPITOR Take 5 mg by mouth daily.   buPROPion 150 MG 12 hr tablet Commonly known as: WELLBUTRIN SR Take 1 tablet (150 mg total) by mouth 2 (two) times daily after a meal.   Camphor-Menthol-Methyl Sal 3.07-31-08 % Ptch Place 1 patch onto the skin. Apply one to lower back daily for back pain   CITRACAL +D3 PO Take 2 capsules by mouth 2 (two) times a day. 676m-25mcg(1000u)   docusate sodium 100 MG capsule Commonly known as: COLACE Take 100 mg by mouth 2 (two) times daily.   ferrous sulfate 325 (65 FE) MG EC tablet Take 325 mg by mouth every Monday, Wednesday, and Friday. With a meal   fish oil-omega-3 fatty acids 1000 MG capsule Take 1 g by mouth daily.   gabapentin 100 MG capsule Commonly known as: NEURONTIN Take 100 mg by mouth 3 (three) times daily.   hydrocortisone cream 1 % Apply 1 application topically daily as needed for itching. PRN to urogenital itchy area X 10 days than PRN   Klor-Con M20 20 MEQ tablet Generic drug: potassium chloride SA TAKE 1 TABLET BY MOUTH TWICE A DAY   losartan 50 MG tablet Commonly known as: COZAAR Take 50 mg by mouth daily.   nitrofurantoin 50 MG  capsule Commonly known as: MACRODANTIN Take 1 capsule (50 mg total) by mouth daily.   nystatin powder Commonly known as: MYCOSTATIN/NYSTOP Apply 100,000 g topically 2 (two) times daily as needed.   nystatin cream Commonly known as: MYCOSTATIN Apply 1 application topically as needed for dry skin.   omeprazole 40 MG capsule Commonly known as: PRILOSEC Take 40 mg by mouth daily.   polyethylene glycol 17 g packet Commonly known as: MIRALAX / GLYCOLAX Take 17 g by mouth as directed. On Monday and Thursday   sertraline 25 MG tablet Commonly known as: ZOLOFT Take 75 mg by mouth daily. 3 tablets=75 mg daily  torsemide 20 MG tablet Commonly known as: DEMADEX Take 20 mg by mouth daily.   traMADol 50 MG tablet Commonly known as: ULTRAM Take 75 mg by mouth every 6 (six) hours as needed.        Review of Systems  Constitutional:  Positive for fatigue. Negative for activity change, appetite change and fever.  Respiratory:  Negative for cough, shortness of breath and wheezing.   Cardiovascular:  Positive for leg swelling. Negative for chest pain.  Neurological:  Positive for tremors and weakness. Negative for dizziness and light-headedness.  Psychiatric/Behavioral:  Negative for dysphoric mood. The patient is not nervous/anxious.    Immunization History  Administered Date(s) Administered   Influenza, High Dose Seasonal PF 04/26/2016, 03/22/2017, 05/08/2019   Influenza,inj,Quad PF,6+ Mos 04/27/2018   Influenza-Unspecified 04/22/2011, 04/04/2012, 05/03/2013, 05/04/2013, 04/17/2014, 04/19/2015, 04/26/2016, 04/22/2017   Moderna SARS-COV2 Booster Vaccination 12/31/2020   Moderna Sars-Covid-2 Vaccination 07/30/2019, 08/27/2019   Pneumococcal Polysaccharide-23 08/26/2003, 09/06/2011, 05/15/2019   Pneumococcal-Unspecified 12/20/2013   Td 09/26/2006   Tdap 05/15/2019   Zoster Recombinat (Shingrix) 11/21/2017, 04/08/2018   Zoster, Live 10/25/2007   Pertinent  Health Maintenance Due   Topic Date Due   PNA vac Low Risk Adult (2 of 2 - PCV13) 05/14/2020   INFLUENZA VACCINE  02/23/2021   DEXA SCAN  Completed   Fall Risk  10/18/2018 08/23/2018 05/09/2018 05/01/2018 04/10/2018  Falls in the past year? 0 0 Yes Yes No  Comment - - - fell in June/2019 _0  Home (asst living) -  Number falls in past yr: 0 0 1 1 -  Injury with Fall? 0 0 No No -  Risk for fall due to : - - - Impaired balance/gait -  Follow up - - - Falls prevention discussed -   Functional Status Survey:    Vitals:   02/16/21 1032  BP: (!) 111/52  Pulse: (!) 54  Resp: 16  Temp: (!) 96.6 F (35.9 C)  SpO2: 96%  Weight: 169 lb 12.8 oz (77 kg)  Height: _1  (1.626 m)   Body mass index is 29.15 kg/m. Physical Exam Vitals reviewed.  Constitutional:      General: She is not in acute distress. HENT:     Head: Normocephalic.  Cardiovascular:     Rate and Rhythm: Normal rate and regular rhythm.     Pulses: Normal pulses.     Heart sounds: Normal heart sounds. No murmur heard. Pulmonary:     Effort: Pulmonary effort is normal. No respiratory distress.     Breath sounds: Normal breath sounds. No wheezing.  Musculoskeletal:     Right lower leg: Edema present.     Left lower leg: Edema present.     Comments: Non-pitting  Skin:    General: Skin is warm and dry.     Capillary Refill: Capillary refill takes less than 2 seconds.  Neurological:     General: No focal deficit present.     Mental Status: She is alert and oriented to person, place, and time.     Motor: Weakness present.     Gait: Gait abnormal.  Psychiatric:        Mood and Affect: Mood is depressed.        Behavior: Behavior normal.    Labs reviewed: Recent Labs    04/10/20 0000 06/05/20 0000 10/16/20 1804  NA 141 137 139  K 4.1 4.0 4.2  CL 104 100 105  CO2 30* 29* 26  GLUCOSE  --   --  111*  BUN 31* 27* 35*  CREATININE 1.3* 1.4* 1.38*  CALCIUM 9.7 9.6 9.7   Recent Labs    04/10/20 0000 06/05/20 0000  AST 14 20   ALT 8 14  ALKPHOS 85 94  ALBUMIN 4.0 3.9   Recent Labs    04/10/20 0000 06/05/20 0000 10/16/20 1804  WBC 3.4 4.4 8.5  NEUTROABS 1,510.00 2,213.00  --   HGB 10.0* 9.7* 10.1*  HCT 32* 31* 33.7*  MCV  --   --  89.4  PLT 92* 77* 97*   Lab Results  Component Value Date   TSH 4.69 06/05/2020   No results found for: HGBA1C Lab Results  Component Value Date   CHOL 154 04/10/2020   HDL 47 04/10/2020   LDLCALC 87 04/10/2020   TRIG 108 04/10/2020   CHOLHDL 3.8 11/23/2018    Significant Diagnostic Results in last 30 days:  No results found.  Assessment/Plan 1. Essential hypertension - SBP< 120 - denies dizziness, no recent falls - decrease losartan to 25 mg daily - cont torsemide 20 mg daily  2. Fatigue, unspecified type - wbc 5.2, hgb 9.8, mcv 86.1 02/02/2021 - TSH level     Family/ staff Communication: plan discussed with patient and nurse  Labs/tests ordered:  TSH

## 2021-02-19 DIAGNOSIS — E079 Disorder of thyroid, unspecified: Secondary | ICD-10-CM | POA: Diagnosis not present

## 2021-02-19 DIAGNOSIS — E039 Hypothyroidism, unspecified: Secondary | ICD-10-CM | POA: Diagnosis not present

## 2021-03-26 ENCOUNTER — Inpatient Hospital Stay (HOSPITAL_COMMUNITY)
Admission: EM | Admit: 2021-03-26 | Discharge: 2021-04-01 | DRG: 493 | Disposition: A | Discharge status: Skilled Nursing Facility | Payer: Medicare Other | Source: Skilled Nursing Facility | Attending: Family Medicine | Admitting: Family Medicine

## 2021-03-26 ENCOUNTER — Emergency Department (HOSPITAL_COMMUNITY): Payer: Medicare Other

## 2021-03-26 ENCOUNTER — Other Ambulatory Visit: Payer: Self-pay

## 2021-03-26 ENCOUNTER — Encounter (HOSPITAL_COMMUNITY): Payer: Self-pay

## 2021-03-26 DIAGNOSIS — S93491A Sprain of other ligament of right ankle, initial encounter: Secondary | ICD-10-CM | POA: Diagnosis not present

## 2021-03-26 DIAGNOSIS — S8261XA Displaced fracture of lateral malleolus of right fibula, initial encounter for closed fracture: Secondary | ICD-10-CM | POA: Diagnosis not present

## 2021-03-26 DIAGNOSIS — K219 Gastro-esophageal reflux disease without esophagitis: Secondary | ICD-10-CM | POA: Diagnosis present

## 2021-03-26 DIAGNOSIS — Z8249 Family history of ischemic heart disease and other diseases of the circulatory system: Secondary | ICD-10-CM

## 2021-03-26 DIAGNOSIS — Z96651 Presence of right artificial knee joint: Secondary | ICD-10-CM | POA: Diagnosis not present

## 2021-03-26 DIAGNOSIS — K5909 Other constipation: Secondary | ICD-10-CM | POA: Diagnosis not present

## 2021-03-26 DIAGNOSIS — S199XXA Unspecified injury of neck, initial encounter: Secondary | ICD-10-CM | POA: Diagnosis not present

## 2021-03-26 DIAGNOSIS — Z79899 Other long term (current) drug therapy: Secondary | ICD-10-CM

## 2021-03-26 DIAGNOSIS — B9620 Unspecified Escherichia coli [E. coli] as the cause of diseases classified elsewhere: Secondary | ICD-10-CM | POA: Diagnosis present

## 2021-03-26 DIAGNOSIS — R609 Edema, unspecified: Secondary | ICD-10-CM | POA: Diagnosis not present

## 2021-03-26 DIAGNOSIS — D649 Anemia, unspecified: Secondary | ICD-10-CM | POA: Diagnosis present

## 2021-03-26 DIAGNOSIS — D696 Thrombocytopenia, unspecified: Secondary | ICD-10-CM | POA: Diagnosis not present

## 2021-03-26 DIAGNOSIS — Z043 Encounter for examination and observation following other accident: Secondary | ICD-10-CM | POA: Diagnosis not present

## 2021-03-26 DIAGNOSIS — Z801 Family history of malignant neoplasm of trachea, bronchus and lung: Secondary | ICD-10-CM | POA: Diagnosis not present

## 2021-03-26 DIAGNOSIS — R6889 Other general symptoms and signs: Secondary | ICD-10-CM | POA: Diagnosis not present

## 2021-03-26 DIAGNOSIS — S82899A Other fracture of unspecified lower leg, initial encounter for closed fracture: Secondary | ICD-10-CM | POA: Diagnosis present

## 2021-03-26 DIAGNOSIS — Z20822 Contact with and (suspected) exposure to covid-19: Secondary | ICD-10-CM | POA: Diagnosis not present

## 2021-03-26 DIAGNOSIS — E039 Hypothyroidism, unspecified: Secondary | ICD-10-CM | POA: Diagnosis present

## 2021-03-26 DIAGNOSIS — R109 Unspecified abdominal pain: Secondary | ICD-10-CM

## 2021-03-26 DIAGNOSIS — G4489 Other headache syndrome: Secondary | ICD-10-CM | POA: Diagnosis not present

## 2021-03-26 DIAGNOSIS — Z87891 Personal history of nicotine dependence: Secondary | ICD-10-CM | POA: Diagnosis not present

## 2021-03-26 DIAGNOSIS — S8251XA Displaced fracture of medial malleolus of right tibia, initial encounter for closed fracture: Secondary | ICD-10-CM | POA: Diagnosis not present

## 2021-03-26 DIAGNOSIS — S82851A Displaced trimalleolar fracture of right lower leg, initial encounter for closed fracture: Principal | ICD-10-CM

## 2021-03-26 DIAGNOSIS — R52 Pain, unspecified: Secondary | ICD-10-CM

## 2021-03-26 DIAGNOSIS — M7989 Other specified soft tissue disorders: Secondary | ICD-10-CM | POA: Diagnosis not present

## 2021-03-26 DIAGNOSIS — Z823 Family history of stroke: Secondary | ICD-10-CM | POA: Diagnosis not present

## 2021-03-26 DIAGNOSIS — M79661 Pain in right lower leg: Secondary | ICD-10-CM | POA: Diagnosis present

## 2021-03-26 DIAGNOSIS — D638 Anemia in other chronic diseases classified elsewhere: Secondary | ICD-10-CM | POA: Diagnosis not present

## 2021-03-26 DIAGNOSIS — M4186 Other forms of scoliosis, lumbar region: Secondary | ICD-10-CM | POA: Diagnosis not present

## 2021-03-26 DIAGNOSIS — Y92121 Bathroom in nursing home as the place of occurrence of the external cause: Secondary | ICD-10-CM

## 2021-03-26 DIAGNOSIS — K862 Cyst of pancreas: Secondary | ICD-10-CM | POA: Diagnosis not present

## 2021-03-26 DIAGNOSIS — D631 Anemia in chronic kidney disease: Secondary | ICD-10-CM | POA: Diagnosis present

## 2021-03-26 DIAGNOSIS — Z8781 Personal history of (healed) traumatic fracture: Secondary | ICD-10-CM | POA: Diagnosis present

## 2021-03-26 DIAGNOSIS — E538 Deficiency of other specified B group vitamins: Secondary | ICD-10-CM | POA: Diagnosis present

## 2021-03-26 DIAGNOSIS — R519 Headache, unspecified: Secondary | ICD-10-CM | POA: Diagnosis not present

## 2021-03-26 DIAGNOSIS — N1832 Chronic kidney disease, stage 3b: Secondary | ICD-10-CM | POA: Diagnosis not present

## 2021-03-26 DIAGNOSIS — R10A2 Flank pain, left side: Secondary | ICD-10-CM

## 2021-03-26 DIAGNOSIS — M25562 Pain in left knee: Secondary | ICD-10-CM

## 2021-03-26 DIAGNOSIS — E785 Hyperlipidemia, unspecified: Secondary | ICD-10-CM | POA: Diagnosis not present

## 2021-03-26 DIAGNOSIS — I130 Hypertensive heart and chronic kidney disease with heart failure and stage 1 through stage 4 chronic kidney disease, or unspecified chronic kidney disease: Secondary | ICD-10-CM | POA: Diagnosis present

## 2021-03-26 DIAGNOSIS — M25551 Pain in right hip: Secondary | ICD-10-CM | POA: Diagnosis not present

## 2021-03-26 DIAGNOSIS — I5032 Chronic diastolic (congestive) heart failure: Secondary | ICD-10-CM | POA: Diagnosis present

## 2021-03-26 DIAGNOSIS — W010XXA Fall on same level from slipping, tripping and stumbling without subsequent striking against object, initial encounter: Secondary | ICD-10-CM | POA: Diagnosis present

## 2021-03-26 DIAGNOSIS — G8918 Other acute postprocedural pain: Secondary | ICD-10-CM | POA: Diagnosis not present

## 2021-03-26 DIAGNOSIS — Z833 Family history of diabetes mellitus: Secondary | ICD-10-CM

## 2021-03-26 DIAGNOSIS — F419 Anxiety disorder, unspecified: Secondary | ICD-10-CM | POA: Diagnosis present

## 2021-03-26 DIAGNOSIS — Z7401 Bed confinement status: Secondary | ICD-10-CM | POA: Diagnosis not present

## 2021-03-26 DIAGNOSIS — S82891A Other fracture of right lower leg, initial encounter for closed fracture: Secondary | ICD-10-CM | POA: Diagnosis not present

## 2021-03-26 DIAGNOSIS — Z743 Need for continuous supervision: Secondary | ICD-10-CM | POA: Diagnosis not present

## 2021-03-26 DIAGNOSIS — R296 Repeated falls: Secondary | ICD-10-CM | POA: Diagnosis present

## 2021-03-26 DIAGNOSIS — N3000 Acute cystitis without hematuria: Secondary | ICD-10-CM | POA: Diagnosis not present

## 2021-03-26 DIAGNOSIS — K449 Diaphragmatic hernia without obstruction or gangrene: Secondary | ICD-10-CM | POA: Diagnosis not present

## 2021-03-26 DIAGNOSIS — S82851D Displaced trimalleolar fracture of right lower leg, subsequent encounter for closed fracture with routine healing: Secondary | ICD-10-CM | POA: Diagnosis not present

## 2021-03-26 DIAGNOSIS — S3991XA Unspecified injury of abdomen, initial encounter: Secondary | ICD-10-CM | POA: Diagnosis not present

## 2021-03-26 DIAGNOSIS — N390 Urinary tract infection, site not specified: Secondary | ICD-10-CM | POA: Diagnosis present

## 2021-03-26 DIAGNOSIS — Z79890 Hormone replacement therapy (postmenopausal): Secondary | ICD-10-CM | POA: Diagnosis not present

## 2021-03-26 DIAGNOSIS — R1084 Generalized abdominal pain: Secondary | ICD-10-CM | POA: Diagnosis not present

## 2021-03-26 DIAGNOSIS — D61818 Other pancytopenia: Secondary | ICD-10-CM | POA: Diagnosis not present

## 2021-03-26 DIAGNOSIS — W19XXXA Unspecified fall, initial encounter: Secondary | ICD-10-CM | POA: Diagnosis not present

## 2021-03-26 DIAGNOSIS — I10 Essential (primary) hypertension: Secondary | ICD-10-CM | POA: Diagnosis not present

## 2021-03-26 DIAGNOSIS — R0902 Hypoxemia: Secondary | ICD-10-CM | POA: Diagnosis not present

## 2021-03-26 LAB — CBC WITH DIFFERENTIAL/PLATELET
Abs Immature Granulocytes: 0.23 10*3/uL — ABNORMAL HIGH (ref 0.00–0.07)
Basophils Absolute: 0.1 10*3/uL (ref 0.0–0.1)
Basophils Relative: 1 %
Eosinophils Absolute: 0 10*3/uL (ref 0.0–0.5)
Eosinophils Relative: 0 %
HCT: 33.7 % — ABNORMAL LOW (ref 36.0–46.0)
Hemoglobin: 10 g/dL — ABNORMAL LOW (ref 12.0–15.0)
Immature Granulocytes: 2 %
Lymphocytes Relative: 11 %
Lymphs Abs: 1.2 10*3/uL (ref 0.7–4.0)
MCH: 27.2 pg (ref 26.0–34.0)
MCHC: 29.7 g/dL — ABNORMAL LOW (ref 30.0–36.0)
MCV: 91.6 fL (ref 80.0–100.0)
Monocytes Absolute: 1 10*3/uL (ref 0.1–1.0)
Monocytes Relative: 9 %
Neutro Abs: 8.6 10*3/uL — ABNORMAL HIGH (ref 1.7–7.7)
Neutrophils Relative %: 77 %
Platelets: 46 10*3/uL — ABNORMAL LOW (ref 150–400)
RBC: 3.68 MIL/uL — ABNORMAL LOW (ref 3.87–5.11)
RDW: 17.8 % — ABNORMAL HIGH (ref 11.5–15.5)
WBC: 11 10*3/uL — ABNORMAL HIGH (ref 4.0–10.5)
nRBC: 0 % (ref 0.0–0.2)

## 2021-03-26 LAB — PROTIME-INR
INR: 1 (ref 0.8–1.2)
Prothrombin Time: 13 seconds (ref 11.4–15.2)

## 2021-03-26 LAB — BASIC METABOLIC PANEL
Anion gap: 9 (ref 5–15)
BUN: 32 mg/dL — ABNORMAL HIGH (ref 8–23)
CO2: 23 mmol/L (ref 22–32)
Calcium: 9.8 mg/dL (ref 8.9–10.3)
Chloride: 104 mmol/L (ref 98–111)
Creatinine, Ser: 1.36 mg/dL — ABNORMAL HIGH (ref 0.44–1.00)
GFR, Estimated: 37 mL/min — ABNORMAL LOW (ref >60)
Glucose, Bld: 110 mg/dL — ABNORMAL HIGH (ref 70–99)
Potassium: 4.3 mmol/L (ref 3.5–5.1)
Sodium: 136 mmol/L (ref 135–145)

## 2021-03-26 LAB — RESP PANEL BY RT-PCR (FLU A&B, COVID) ARPGX2
Influenza A by PCR: NEGATIVE
Influenza B by PCR: NEGATIVE
SARS Coronavirus 2 by RT PCR: NEGATIVE

## 2021-03-26 MED ORDER — BUPROPION HCL ER (SR) 150 MG PO TB12
150.0000 mg | ORAL_TABLET | Freq: Two times a day (BID) | ORAL | Status: DC
  Administered 2021-03-26 – 2021-03-31 (×11): 150 mg via ORAL
  Filled 2021-03-26 (×11): qty 1

## 2021-03-26 MED ORDER — LOSARTAN POTASSIUM 50 MG PO TABS
25.0000 mg | ORAL_TABLET | Freq: Every day | ORAL | Status: DC
  Administered 2021-03-27 – 2021-03-31 (×5): 25 mg via ORAL
  Filled 2021-03-26 (×5): qty 1

## 2021-03-26 MED ORDER — ACETAMINOPHEN 325 MG PO TABS
650.0000 mg | ORAL_TABLET | Freq: Four times a day (QID) | ORAL | Status: DC | PRN
  Administered 2021-03-29 – 2021-03-31 (×4): 650 mg via ORAL
  Filled 2021-03-26 (×4): qty 2

## 2021-03-26 MED ORDER — FERROUS SULFATE 325 (65 FE) MG PO TABS
325.0000 mg | ORAL_TABLET | ORAL | Status: DC
  Administered 2021-03-27 – 2021-03-30 (×2): 325 mg via ORAL
  Filled 2021-03-26 (×3): qty 1

## 2021-03-26 MED ORDER — OXYCODONE HCL 5 MG PO TABS
5.0000 mg | ORAL_TABLET | Freq: Once | ORAL | Status: AC
  Administered 2021-03-26: 5 mg via ORAL
  Filled 2021-03-26: qty 1

## 2021-03-26 MED ORDER — ACETAMINOPHEN 500 MG PO TABS: 1000.0000 mg | ORAL_TABLET | Freq: Three times a day (TID) | ORAL | Status: DC | PRN

## 2021-03-26 MED ORDER — TORSEMIDE 20 MG PO TABS
20.0000 mg | ORAL_TABLET | Freq: Every day | ORAL | Status: DC
  Administered 2021-03-26 – 2021-03-31 (×5): 20 mg via ORAL
  Filled 2021-03-26 (×6): qty 1

## 2021-03-26 MED ORDER — OMEGA-3-ACID ETHYL ESTERS 1 G PO CAPS
1.0000 g | ORAL_CAPSULE | Freq: Every day | ORAL | Status: DC
  Administered 2021-03-26 – 2021-03-31 (×5): 1 g via ORAL
  Filled 2021-03-26 (×6): qty 1

## 2021-03-26 MED ORDER — FENTANYL CITRATE PF 50 MCG/ML IJ SOSY
50.0000 ug | PREFILLED_SYRINGE | INTRAMUSCULAR | Status: DC | PRN
  Administered 2021-03-26: 50 ug via INTRAVENOUS
  Filled 2021-03-26: qty 1

## 2021-03-26 MED ORDER — ATORVASTATIN CALCIUM 10 MG PO TABS
5.0000 mg | ORAL_TABLET | Freq: Every day | ORAL | Status: DC
  Administered 2021-03-26 – 2021-03-31 (×6): 5 mg via ORAL
  Filled 2021-03-26 (×6): qty 1

## 2021-03-26 MED ORDER — ALPRAZOLAM 0.25 MG PO TABS
0.2500 mg | ORAL_TABLET | Freq: Every day | ORAL | Status: DC
  Administered 2021-03-26 – 2021-03-31 (×6): 0.25 mg via ORAL
  Filled 2021-03-26 (×6): qty 1

## 2021-03-26 MED ORDER — ACETAMINOPHEN 650 MG RE SUPP: 650.0000 mg | Freq: Four times a day (QID) | RECTAL | Status: DC | PRN

## 2021-03-26 MED ORDER — FENTANYL CITRATE PF 50 MCG/ML IJ SOSY
50.0000 ug | PREFILLED_SYRINGE | Freq: Once | INTRAMUSCULAR | Status: AC
  Administered 2021-03-26: 50 ug via INTRAVENOUS
  Filled 2021-03-26: qty 1

## 2021-03-26 MED ORDER — TRAMADOL HCL 50 MG PO TABS
75.0000 mg | ORAL_TABLET | Freq: Four times a day (QID) | ORAL | Status: DC | PRN
Start: 2021-03-26 — End: 2021-04-01
  Administered 2021-03-26 – 2021-03-31 (×8): 75 mg via ORAL
  Filled 2021-03-26 (×8): qty 2

## 2021-03-26 MED ORDER — SERTRALINE HCL 50 MG PO TABS
75.0000 mg | ORAL_TABLET | Freq: Every day | ORAL | Status: DC
  Administered 2021-03-26 – 2021-03-31 (×6): 75 mg via ORAL
  Filled 2021-03-26 (×3): qty 1
  Filled 2021-03-26: qty 2
  Filled 2021-03-26 (×2): qty 1

## 2021-03-26 MED ORDER — PANTOPRAZOLE SODIUM 40 MG PO TBEC
40.0000 mg | DELAYED_RELEASE_TABLET | Freq: Every day | ORAL | Status: DC
  Administered 2021-03-26 – 2021-03-31 (×6): 40 mg via ORAL
  Filled 2021-03-26 (×6): qty 1

## 2021-03-26 MED ORDER — GABAPENTIN 100 MG PO CAPS
100.0000 mg | ORAL_CAPSULE | Freq: Three times a day (TID) | ORAL | Status: DC
  Administered 2021-03-26 – 2021-03-31 (×17): 100 mg via ORAL
  Filled 2021-03-26 (×17): qty 1

## 2021-03-26 MED ORDER — DOCUSATE SODIUM 100 MG PO CAPS
100.0000 mg | ORAL_CAPSULE | Freq: Two times a day (BID) | ORAL | Status: DC
  Administered 2021-03-26 – 2021-03-31 (×12): 100 mg via ORAL
  Filled 2021-03-26 (×12): qty 1

## 2021-03-26 MED ORDER — LOSARTAN POTASSIUM 50 MG PO TABS
50.0000 mg | ORAL_TABLET | Freq: Every day | ORAL | Status: DC
  Administered 2021-03-26: 50 mg via ORAL
  Filled 2021-03-26: qty 2

## 2021-03-26 MED ORDER — MORPHINE SULFATE (PF) 2 MG/ML IV SOLN
2.0000 mg | INTRAVENOUS | Status: DC | PRN
  Administered 2021-03-26 – 2021-03-30 (×5): 2 mg via INTRAVENOUS
  Filled 2021-03-26 (×6): qty 1

## 2021-03-26 MED ORDER — LEVOTHYROXINE SODIUM 75 MCG PO TABS
75.0000 ug | ORAL_TABLET | Freq: Every day | ORAL | Status: DC
  Administered 2021-03-28 – 2021-03-31 (×4): 75 ug via ORAL
  Filled 2021-03-26 (×4): qty 1

## 2021-03-26 MED ORDER — LEVOTHYROXINE SODIUM 50 MCG PO TABS
75.0000 ug | ORAL_TABLET | Freq: Every day | ORAL | Status: DC
  Administered 2021-03-26: 75 ug via ORAL
  Filled 2021-03-26: qty 1

## 2021-03-26 MED ORDER — CALCIUM CARBONATE-VITAMIN D 500-200 MG-UNIT PO TABS
1.0000 | ORAL_TABLET | Freq: Two times a day (BID) | ORAL | Status: DC
  Administered 2021-03-26 – 2021-03-31 (×11): 1 via ORAL
  Filled 2021-03-26 (×11): qty 1

## 2021-03-26 NOTE — H&P (Signed)
History and Physical    [name] [name] XAJ:[id number] DOB: 1930-07-31 DOA: 03/26/2021  PCP: Virgie Dad, MD  Patient coming from: Assisted living facility [facility])  Chief Complaint: Fall, ankle pain  HPI: [name] [name] is a 87 y.o. female with medical history significant of anxiety, HLD, HTN, hypothyroidism. Presenting with ankle pain after a fall. She reports that last night she was getting out of bed to find one of her assistive devices. She moved to the bathroom and tripped over it. She fell on her stomach. She did not hit her head. There was no LOC and she remembers the entire fall. She was unable to get up d/t pain. She believes she was down for 15 - 20 minutes before help arrived. She was brought to the ED for evaluation. She reports multiple frequent falls over the last several years. She denies any other aggravating or alleviating factors.   ED Course: CTH/c-spine showed no acute changes. XR right ankle showed trimalleolar fracture. Ortho was consulted. TRH was called for admission.   Review of Systems:  Review of systems is otherwise negative for all not mentioned in HPI.   PMHx Past Medical History:  Diagnosis Date   Anemia    Anxiety    Chronotropic incompetence 12/2012    potentially medication related; noted on CPET    DDD (degenerative disc disease)    Eczema    GERD (gastroesophageal reflux disease)    H/O hiatal hernia    Hx: UTI (urinary tract infection)    Hyperlipidemia    Hypertension    Hypothyroidism    Osteopenia    Septicemia ([place]) 2002   following UTI   Vertigo, benign positional     PSHx Past Surgical History:  Procedure Laterality Date   BREAST SURGERY     left biopsy   CATARACT EXTRACTION Right    2 weeks ago   CPET / MET - PFTS     Consistent with chronotropic incompetence; See attached report in the results section   DILATION AND CURETTAGE OF UTERUS     x 2   DOPPLER ECHOCARDIOGRAPHY  01/10/2013   Normal LV size and function.  Normal EF. Air sclerosis but no stenosis   ESOPHAGOGASTRODUODENOSCOPY  05/04/2012   Procedure: ESOPHAGOGASTRODUODENOSCOPY (EGD);  Surgeon: Inda Castle, MD;  Location: [facility];  Service: Endoscopy;  Laterality: N/A;   EYE SURGERY     cataract extraction with ILO  ? eye   IR KYPHO EA ADDL LEVEL THORACIC OR LUMBAR  10/28/2016   IR KYPHO LUMBAR INC FX REDUCE BONE BX UNI/BIL CANNULATION INC/IMAGING  10/28/2016   IR KYPHO THORACIC WITH BONE BIOPSY  12/24/2016   IR RADIOLOGIST EVAL & MGMT  11/11/2016   KNEE ARTHROSCOPY  2011   right   TONSILLECTOMY     TOTAL KNEE ARTHROPLASTY  04/24/2012   Procedure: TOTAL KNEE ARTHROPLASTY;  Surgeon: Gearlean Alf, MD;  Location: [facility];  Service: Orthopedics;  Laterality: Right;   TRANSTHORACIC ECHOCARDIOGRAM  02/2018   Ordered by PCP for "congestive heart failure " -EF 60 M 65%.  GR 1 DD.  No R WMA--- > ESSENTIALLY NORMAL    SocHx  reports that she quit smoking about 55 years ago. Her smoking use included cigarettes. She has never used smokeless tobacco. She reports that she does not drink alcohol and does not use drugs.  No Known Allergies  FamHx Family History  Problem Relation Age of Onset   Lung cancer Father 62  Died at age 17   Stroke Mother 59       Died in her late 18s.   Heart attack Mother 64   Diabetes Mother    Arthritis Mother     Prior to Admission medications   Medication Sig Start Date End Date Taking? Authorizing Provider  acetaminophen (TYLENOL) 500 MG tablet Take 1,000 mg by mouth 3 (three) times daily as needed for mild pain.   Yes [provider]  ALPRAZolam (XANAX) 0.25 MG tablet Take 1 tablet (0.25 mg total) by mouth at bedtime. Patient taking differently: Take 0.25 mg by mouth at bedtime as needed for sleep or anxiety. 04/01/20  Yes Virgie Dad, MD  atorvastatin (LIPITOR) 10 MG tablet Take 5 mg by mouth daily.    Yes [provider]  buPROPion (WELLBUTRIN SR) 150 MG 12 hr tablet Take 1 tablet  (150 mg total) by mouth 2 (two) times daily after a meal. 04/10/18  Yes Blanchie Serve, MD  calcium citrate-vitamin D (CITRACAL+D) 315-200 MG-UNIT tablet Take 1 tablet by mouth 2 (two) times daily.   Yes [provider]  Camphor-Menthol-Methyl Sal 3.07-31-08 % PTCH Place 1 patch onto the skin daily. Apply one to lower back daily for back pain   Yes [provider]  Carboxymethylcellulose Sodium (ARTIFICIAL TEARS OP) Apply 1 drop to eye 3 (three) times daily as needed (dry eyes).   Yes [provider]  docusate sodium (COLACE) 100 MG capsule Take 100 mg by mouth 2 (two) times daily.   Yes [provider]  ferrous sulfate 325 (65 FE) MG EC tablet Take 325 mg by mouth every Monday, Wednesday, and Friday. With a meal   Yes [provider]  fish oil-omega-3 fatty acids 1000 MG capsule Take 1 g by mouth daily.   Yes [provider]  gabapentin (NEURONTIN) 100 MG capsule Take 100 mg by mouth 3 (three) times daily.   Yes [provider]  hydrocortisone cream 1 % Apply 1 application topically daily as needed for itching.   Yes [provider]  KLOR-CON M20 20 MEQ tablet TAKE 1 TABLET BY MOUTH TWICE A DAY Patient taking differently: Take 20 mEq by mouth 2 (two) times daily. 11/17/18  Yes Virgie Dad, MD  levothyroxine (SYNTHROID) 75 MCG tablet Take 75 mcg by mouth daily. 03/06/21  Yes [provider]  losartan (COZAAR) 25 MG tablet Take 25 mg by mouth daily. 02/16/21  Yes [provider]  nitrofurantoin (MACRODANTIN) 50 MG capsule Take 50 mg by mouth daily.   Yes [provider]  nystatin (MYCOSTATIN/NYSTOP) powder Apply 1 application topically 2 (two) times daily as needed (yeast).   Yes [provider]  nystatin cream (MYCOSTATIN) Apply 1 application topically as needed for dry skin.   Yes [provider]  omeprazole (PRILOSEC) 40 MG capsule Take 40 mg by mouth daily.   Yes [provider]  polyethylene glycol (MIRALAX / GLYCOLAX) 17 g packet Take 17 g by mouth as directed. On Monday and Thursday   Yes [provider]  sertraline (ZOLOFT) 25 MG tablet Take 75 mg by mouth daily.   Yes [provider]  torsemide (DEMADEX) 20 MG tablet Take 20 mg by mouth daily.   Yes [provider]  traMADol (ULTRAM) 50 MG tablet Take 75 mg by mouth every 6 (six) hours as needed for moderate pain.   Yes [provider]    Physical Exam: Vitals:   03/26/21 1100  03/26/21 1130 03/26/21 1230 03/26/21 1330  BP: (!) 153/46 (!) 128/56 (!) 121/44 (!) 121/40  Pulse: 62 75 61 66  Resp: _0 Temp:      TempSrc:      SpO2: 96% 98% 98% 98%  Weight:      Height:        General: 87 y.o. female resting in bed in NAD Eyes: PERRL, normal sclera ENMT: Nares patent w/o discharge, orophaynx clear, dentition normal, ears w/o discharge/lesions/ulcers Neck: Supple, trachea midline Cardiovascular: RRR, +S1, S2, no m/g/r, equal pulses throughout Respiratory: CTABL, no w/r/r, normal WOB GI: BS+, NDNT, no masses noted, no organomegaly noted MSK: No e/c/c; RLE in splint Skin: No rashes, bruises, ulcerations noted Neuro: A&O x 3, no focal deficits Psyc: Appropriate interaction and affect, calm/cooperative  Labs on Admission: I have personally reviewed following labs and imaging studies  CBC: Recent Labs  Lab 03/26/21 0334  WBC 11.0*  NEUTROABS 8.6*  HGB 10.0*  HCT 33.7*  MCV 91.6  PLT 46*   Basic Metabolic Panel: Recent Labs  Lab 03/26/21 0334  NA 136  K 4.3  CL 104  CO2 23  GLUCOSE 110*  BUN 32*  CREATININE 1.36*  CALCIUM 9.8   GFR: Estimated Creatinine Clearance: 29.1 mL/min (A) (by C-G formula based on SCr of 1.36 mg/dL (H)). Liver Function Tests: No results for input(s): AST, ALT, ALKPHOS, BILITOT, PROT, ALBUMIN in the last 168 hours. No results for input(s): LIPASE, AMYLASE in the last 168 hours. No results for input(s): AMMONIA in the  last 168 hours. Coagulation Profile: Recent Labs  Lab 03/26/21 0430  INR 1.0   Cardiac Enzymes: No results for input(s): CKTOTAL, CKMB, CKMBINDEX, TROPONINI in the last 168 hours. BNP (last 3 results) No results for input(s): PROBNP in the last 8760 hours. HbA1C: No results for input(s): HGBA1C in the last 72 hours. CBG: No results for input(s): GLUCAP in the last 168 hours. Lipid Profile: No results for input(s): CHOL, HDL, LDLCALC, TRIG, CHOLHDL, LDLDIRECT in the last 72 hours. Thyroid Function Tests: No results for input(s): TSH, T4TOTAL, FREET4, T3FREE, THYROIDAB in the last 72 hours. Anemia Panel: No results for input(s): VITAMINB12, FOLATE, FERRITIN, TIBC, IRON, RETICCTPCT in the last 72 hours. Urine analysis:    Component Value Date/Time   COLORURINE YELLOW 01/14/2018 1633   APPEARANCEUR HAZY (A) 01/14/2018 1633   LABSPEC >1.046 (H) 01/14/2018 1633   PHURINE 6.0 01/14/2018 1633   GLUCOSEU NEGATIVE 01/14/2018 1633   HGBUR SMALL (A) 01/14/2018 1633   BILIRUBINUR NEGATIVE 01/14/2018 1633   KETONESUR NEGATIVE 01/14/2018 1633   PROTEINUR NEGATIVE 01/14/2018 1633   UROBILINOGEN 0.2 03/24/2015 1527   NITRITE NEGATIVE 01/14/2018 1633   LEUKOCYTESUR MODERATE (A) 01/14/2018 1633    Radiological Exams on Admission: CT ABDOMEN PELVIS WO CONTRAST  Result Date: 03/26/2021 CLINICAL DATA:  Abdominal trauma.  Back pain after fall last night EXAM: CT ABDOMEN AND PELVIS WITHOUT CONTRAST TECHNIQUE: Multidetector CT imaging of the abdomen and pelvis was performed following the standard protocol without IV contrast. COMPARISON:  01/14/2018 FINDINGS: Lower chest: Atheromatous calcification of the aorta and coronaries. Small sliding hiatal hernia. Hepatobiliary: No focal liver abnormality.No evidence of biliary obstruction or stone. Pancreas: 2 cm cystic density projecting posteriorly from the pancreas, non progressed and non worrisome given patient age. No visible injury. Spleen:  Unremarkable. Adrenals/Urinary Tract: Negative adrenals. No hydronephrosis or stone. Unremarkable bladder with moderate distension. Stomach/Bowel:  No obstruction. No visible injury or inflammation. Vascular/Lymphatic: Diffuse  atheromatous calcification of the aorta, iliacs, and branch vessels. No acute vascular finding. No mass or adenopathy. Reproductive:No pathologic findings. Other: No ascites or pneumoperitoneum. Musculoskeletal: No acute finding. Prior T9, T11, L1, and L2 compression fractures with cement augmentation. Generalized spinal degeneration with lumbar dextroscoliosis. IMPRESSION: 1. No acute or interval finding. 2. Chronic findings are stable from 2019 and described above. Electronically Signed   By: Monte Fantasia M.D.   On: 03/26/2021 07:39   DG Chest 1 View  Result Date: 03/26/2021 CLINICAL DATA:  Fall. EXAM: CHEST  1 VIEW COMPARISON:  Chest radiograph dated 01/14/2018 FINDINGS: Evaluation is limited due to patient's positioning and superimposition of the mandible over the right upper lobe. No focal consolidation, pleural effusion pneumothorax. Stable cardiac silhouette. No acute osseous pathology. Osteopenia with degenerative changes of the spine. Multilevel thoracic vertebroplasty. IMPRESSION: No acute cardiopulmonary process. Electronically Signed   By: Anner Crete M.D.   On: 03/26/2021 02:45   DG Ankle Complete Right  Result Date: 03/26/2021 CLINICAL DATA:  Fall and right lower extremity pain. EXAM: RIGHT KNEE - COMPLETE 4+ VIEW; RIGHT ANKLE - COMPLETE 3+ VIEW COMPARISON:  None. FINDINGS: There is a mildly displaced fracture of the medial malleolus. Minimally displaced fracture of the lateral malleolus. There is a nondisplaced fracture of the posterior malleolus. No other acute fracture. The bones are osteopenic. There is no dislocation. There is a total right knee arthroplasty. The arthroplasty components appear intact and in anatomic alignment. There is diffuse subcutaneous  edema. IMPRESSION: 1. Trimalleolar fracture.  No dislocation. 2. Right knee arthroplasty appears intact. Electronically Signed   By: Anner Crete M.D.   On: 03/26/2021 02:44   CT HEAD WO CONTRAST (5MM)  Result Date: 03/26/2021 CLINICAL DATA:  Headache, new or worsening (Age >= 50y); Neck trauma (Age >= 65y) Neck trauma (Age >= 65y) EXAM: CT HEAD WITHOUT CONTRAST CT CERVICAL SPINE WITHOUT CONTRAST TECHNIQUE: Multidetector CT imaging of the head and cervical spine was performed following the standard protocol without intravenous contrast. Multiplanar CT image reconstructions of the cervical spine were also generated. COMPARISON:  January 14, 2018. FINDINGS: CT HEAD FINDINGS Brain: No evidence of acute infarction, hemorrhage, hydrocephalus, extra-axial collection or mass lesion/mass effect. Mild to moderate for age patchy and confluent white matter hypoattenuation, nonspecific but compatible with chronic microvascular ischemic disease. Mild for age atrophy with ex vacuo ventricular dilation. Partially empty sella. Vascular: Calcific intracranial atherosclerosis. No hyperdense vessel identified. Skull: No acute fracture. Sinuses/Orbits: Clear sinuses.  Unremarkable orbits. Other: No mastoid effusions. CT CERVICAL SPINE FINDINGS Alignment: Similar alignment. Similar mild anterolisthesis of C7 on T1, likely degenerative given stability and facet arthropathy at this level. Skull base and vertebrae: No evidence of acute fracture. Similar vertebral body heights. Osteopenia. Soft tissues and spinal canal: No prevertebral fluid or swelling. No visible canal hematoma. Disc levels: Similar moderate degenerative change at C5-C6 and C6-C7. Similar likely moderate foraminal stenosis at multiple levels due to facet/uncovertebral hypertrophy. Upper chest: Biapical pleuroparenchymal scarring and mild dependent probable atelectasis. Otherwise, lung apices are clear. Other: Calcific atherosclerosis. IMPRESSION: CT Head 1. No  evidence of acute intracranial abnormality. 2. Mild-to-moderate chronic microvascular ischemic disease and atrophy. CT Cervical Spine 1. No evidence of acute fracture or traumatic malalignment. 2. Similar moderate degenerative change at C5-C6 and C6-C7 and multilevel facet arthropathy with likely moderate multilevel foraminal stenosis. Electronically Signed   By: Margaretha Sheffield M.D.   On: 03/26/2021 07:37   CT Cervical Spine Wo Contrast  Result Date: 03/26/2021 CLINICAL  DATA:  Headache, new or worsening (Age >= 50y); Neck trauma (Age >= 65y) Neck trauma (Age >= 65y) EXAM: CT HEAD WITHOUT CONTRAST CT CERVICAL SPINE WITHOUT CONTRAST TECHNIQUE: Multidetector CT imaging of the head and cervical spine was performed following the standard protocol without intravenous contrast. Multiplanar CT image reconstructions of the cervical spine were also generated. COMPARISON:  January 14, 2018. FINDINGS: CT HEAD FINDINGS Brain: No evidence of acute infarction, hemorrhage, hydrocephalus, extra-axial collection or mass lesion/mass effect. Mild to moderate for age patchy and confluent white matter hypoattenuation, nonspecific but compatible with chronic microvascular ischemic disease. Mild for age atrophy with ex vacuo ventricular dilation. Partially empty sella. Vascular: Calcific intracranial atherosclerosis. No hyperdense vessel identified. Skull: No acute fracture. Sinuses/Orbits: Clear sinuses.  Unremarkable orbits. Other: No mastoid effusions. CT CERVICAL SPINE FINDINGS Alignment: Similar alignment. Similar mild anterolisthesis of C7 on T1, likely degenerative given stability and facet arthropathy at this level. Skull base and vertebrae: No evidence of acute fracture. Similar vertebral body heights. Osteopenia. Soft tissues and spinal canal: No prevertebral fluid or swelling. No visible canal hematoma. Disc levels: Similar moderate degenerative change at C5-C6 and C6-C7. Similar likely moderate foraminal stenosis at multiple  levels due to facet/uncovertebral hypertrophy. Upper chest: Biapical pleuroparenchymal scarring and mild dependent probable atelectasis. Otherwise, lung apices are clear. Other: Calcific atherosclerosis. IMPRESSION: CT Head 1. No evidence of acute intracranial abnormality. 2. Mild-to-moderate chronic microvascular ischemic disease and atrophy. CT Cervical Spine 1. No evidence of acute fracture or traumatic malalignment. 2. Similar moderate degenerative change at C5-C6 and C6-C7 and multilevel facet arthropathy with likely moderate multilevel foraminal stenosis. Electronically Signed   By: Margaretha Sheffield M.D.   On: 03/26/2021 07:37   DG Knee Complete 4 Views Right  Result Date: 03/26/2021 CLINICAL DATA:  Fall and right lower extremity pain. EXAM: RIGHT KNEE - COMPLETE 4+ VIEW; RIGHT ANKLE - COMPLETE 3+ VIEW COMPARISON:  None. FINDINGS: There is a mildly displaced fracture of the medial malleolus. Minimally displaced fracture of the lateral malleolus. There is a nondisplaced fracture of the posterior malleolus. No other acute fracture. The bones are osteopenic. There is no dislocation. There is a total right knee arthroplasty. The arthroplasty components appear intact and in anatomic alignment. There is diffuse subcutaneous edema. IMPRESSION: 1. Trimalleolar fracture.  No dislocation. 2. Right knee arthroplasty appears intact. Electronically Signed   By: Anner Crete M.D.   On: 03/26/2021 02:44   DG Ankle Right Port  Result Date: 03/26/2021 CLINICAL DATA:  Postreduction EXAM: PORTABLE RIGHT ANKLE - 2 VIEW COMPARISON:  Earlier today FINDINGS: Acute trimalleolar fracture with mild posterior displacement at the distal fibula. No change in alignment when compared to prior. Generalized osteopenia soft tissue swelling. IMPRESSION: Stable ankle fracture alignment after splinting. Electronically Signed   By: Monte Fantasia M.D.   On: 03/26/2021 05:46   DG Hip Unilat With Pelvis 2-3 Views Right  Result Date:  03/26/2021 CLINICAL DATA:  Fall and right hip pain. EXAM: DG HIP (WITH OR WITHOUT PELVIS) 2-3V RIGHT COMPARISON:  Right hip radiograph dated 10/16/2020 FINDINGS: There is no acute fracture or dislocation. The bones are osteopenic. Mild bilateral hip arthritic changes. The soft tissues are unremarkable. IMPRESSION: No acute fracture or dislocation. Electronically Signed   By: Anner Crete M.D.   On: 03/26/2021 02:42    EKG: Independently reviewed. Sinus, no st elevation  Assessment/Plan Fall Trimalleolar Fx     - admit to inpt, tele     - orthopedics to take for  ORIF tomorrow     - ok to eat tonight     - PT/OT     - low plts, use SCDs for DVT Ppx     - pain control  Thrombocytopenia Normocytic anemia     - she has chronically low plts; but she is just off her baseline     - no evidence of bleed     - check peripheral smear  HTN     - continue home regimen  HLD     - continue home statin  CKD3b     - she is at baseline, follow  Anxiety     - continue home regimen  DVT prophylaxis: SCDs  Code Status: DNI  Family Communication: w/ son at bedside  Consults called: Ortho   Status is: Inpatient  Remains inpatient appropriate because:Inpatient level of care appropriate due to severity of illness  Dispo: The patient is from: ALF              Anticipated d/c is to:  TBD              Patient currently is not medically stable to d/c.   Difficult to place patient No  Time spent coordinating admission: 70 minutes  [facility]  If 7PM-7AM, please contact night-coverage [email or web address]  03/26/2021, 2:25 PM

## 2021-03-26 NOTE — Discharge Instructions (Addendum)
Diet: As you were doing prior to hospitalization   Shower:  May shower but keep the wounds dry, use an occlusive plastic wrap, NO SOAKING IN TUB.  If the bandage gets wet, change with a clean dry gauze.  If you have a splint on, leave the splint in place and keep the splint dry with a plastic bag.  Dressing:  You may change your dressing 3-5 days after surgery, unless you have a splint.  If you have a splint, then just leave the splint in place and we will change your bandages during your first follow-up appointment.    If you had hand or foot surgery, we will plan to remove your stitches in about 2 weeks in the office.  For all other surgeries, there are sticky tapes (steri-strips) on your wounds and all the stitches are absorbable.  Leave the steri-strips in place when changing your dressings, they will peel off with time, usually 2-3 weeks.  Activity:  Increase activity slowly as tolerated, but follow the weight bearing instructions below.  The rules on driving is that you can not be taking narcotics while you drive, and you must feel in control of the vehicle.    Weight Bearing: non weight bearing on right leg  To prevent constipation: you may use a stool softener such as -  Colace (over the counter) 100 mg by mouth twice a day  Drink plenty of fluids (prune juice may be helpful) and high fiber foods Miralax (over the counter) for constipation as needed.    Itching:  If you experience itching with your medications, try taking only a single pain pill, or even half a pain pill at a time.  You may take up to 10 pain pills per day, and you can also use benadryl over the counter for itching or also to help with sleep.   Precautions:  If you experience chest pain or shortness of breath - call 911 immediately for transfer to the hospital emergency department!!  If you develop a fever greater that 101 F, purulent drainage from wound, increased redness or drainage from wound, or calf pain -- Call  the office at [phone]                                                Follow- Up Appointment:  Please call for an appointment to be seen in 2 weeks [place] - [phone]

## 2021-03-26 NOTE — Progress Notes (Signed)
Patient ID: [name] [name], female   DOB: 1931-01-05, 87 y.o.   MRN: [id number]  Orthopedics aware of pt. Plan ORIF tomorrow afternoon with Dr. Dion Saucier. Full consult note to follow in AM.    Freeman Caldron, PA-C Orthopedic Surgery [phone]

## 2021-03-26 NOTE — ED Provider Notes (Addendum)
Patient has trimalleolar fracture that has been splinted.  CT scan of head, neck, abdomen and pelvis is pending.  She lives at assisted living facility.  Seems like she can likely go back to facility with higher level of care there.  She will need a wheelchair.  Orthopedics aware and will follow up outpatient.  We will rule out other traumatic injuries.  Anticipate discharge back to facility.  Social work/case management has been consulted to help arrange for safety plan at home.  Wheelchair has been ordered.  Patient will need wheelchair due to ankle fracture with poor mobility at baseline.  CT imaging unremarkable.  Awaiting for social work/case management to evaluate for home needs.  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    Social worker evaluated the patient.  She will need prior authorization for increased care at facility.  Does not appear like this will be approved today.  She will need evaluation by physical therapy as well.    Will retouch base with orthopedics.  If they are willing to do surgery more emergently/urgently can be admitted to medicine Per medicine team, if not will await placement back to facility in the ED.  Orthopedics to give final plan if they have time to do surgery today or tomorrow.  If they do not patient will await placement back to facility in ED.  If they will do surgery can be admitted to medicine.  1:59 PM orthopedics called back and states that they are able to do surgery tomorrow afternoon.  We will admit her to medicine.  This chart was dictated using voice recognition software.  Despite best efforts to proofread,  errors can occur which can change the documentation meaning.    Virgina Norfolk, DO 03/26/21 0757    Virgina Norfolk, DO 03/26/21 1043    Lockie Mola, Estela Vinal, DO 03/26/21 1134    Taiven Greenley, DO 03/26/21 1256    Virgina Norfolk, DO 03/26/21 1359

## 2021-03-26 NOTE — ED Triage Notes (Signed)
Patient BIB EMS for evaluation of R ankle and knee pain s/p mechanical fall.  Patient reports she tripped and fell.  No reports of LOC.  Able to recall event.  Has pain and deformity to R ankle

## 2021-03-26 NOTE — Progress Notes (Signed)
Orthopedic Tech Progress Note Patient Details:  [name] [name] 09/04/1930 [id number]  Ortho Devices Type of Ortho Device: Stirrup splint, Post (short) splint Splint Material: Fiberglass Ortho Device/Splint Location: RLE Ortho Device/Splint Interventions: Ordered, Application, Adjustment   Post Interventions Patient Tolerated: Well Instructions Provided: Care of device, Poper ambulation with device  Lynn Sissel 03/26/2021, 6:21 AM

## 2021-03-26 NOTE — ED Notes (Signed)
Patient taken to xray at this time.

## 2021-03-26 NOTE — Plan of Care (Signed)
  Problem: Clinical Measurements: Goal: Will remain free from infection Outcome: Progressing   Problem: Activity: Goal: Risk for activity intolerance will decrease Outcome: Progressing   Problem: Nutrition: Goal: Adequate nutrition will be maintained Outcome: Progressing   Problem: Coping: Goal: Level of anxiety will decrease Outcome: Progressing   Problem: Elimination: Goal: Will not experience complications related to bowel motility Outcome: Progressing   Problem: Pain Managment: Goal: General experience of comfort will improve Outcome: Progressing

## 2021-03-26 NOTE — Evaluation (Signed)
Physical Therapy Evaluation Patient Details Name: TORREY HORSEMAN MRN: [id number] DOB: May 05, 1931 Today's Date: 03/26/2021   History of Present Illness  87 yo female presented to ED 03/26/2021 after fall in her Assited living apartment at [facility]. She sustained a R trimalleolar fracture , minimally displaced and is scheduled for ORIF of R ankle on 03/27/2021. PMH: R TKA 2013, and compression fx lumbar with kyphoplast 2018.  Clinical Impression  Pt in ED with R ankle fracture, from assisted living at [facility], and will need higher level of care and rehab prior to returning  back to her assisted living apartment. After our session we learned she was going to be admitted and have surgery tomorrow. We will keep her on caseload while she is here in acute care.     Follow Up Recommendations SNF    Equipment Recommendations       Recommendations for Other Services       Precautions / Restrictions Precautions Required Braces or Orthoses: Splint/Cast Splint/Cast: R ankle in soft cast Restrictions Weight Bearing Restrictions: Yes RLE Weight Bearing: Non weight bearing (assuming R LE NWB due to fracture. No order at this time.)      Mobility  Bed Mobility Overal bed mobility: Needs Assistance Bed Mobility: Supine to Sit     Supine to sit: +2 for physical assistance;Mod assist     General bed mobility comments: pt needed assistance with lower body and upper body, however did fairly well considering it was a stretcher in ED and she normally sleeps in recliner and  doesn't get in and out of a bed . Sat EOB for about 5 minutes, no assistance needed for sitting and attemtpted scooting with B UEs on bed laterally , had some strength and pushing power but not enough for scoot on the mattress on the ED stretcher surface. I wanted to simulate ability to begin tranining to lateral scoots on sliding board for future transfer ability.    Transfers Overall transfer level:  (unable to tranfer  today due to height of stretcher and R LE NWB)                  Ambulation/Gait                Stairs            Wheelchair Mobility    Modified Rankin (Stroke Patients Only)       Balance Overall balance assessment: Needs assistance Sitting-balance support: No upper extremity supported;Feet unsupported Sitting balance-Leahy Scale: Good       Standing balance-Leahy Scale: Zero (unable to perform standing)                               Pertinent Vitals/Pain Pain Assessment: Faces Faces Pain Scale: No hurt (pt stated it was hurting A LOT, but now since elevated and slinted and pain meds, it is not hurting much at all.) Pain Location: R LE ankle area Pain Descriptors / Indicators: Aching (very little)    Home Living Family/patient expects to be discharged to:: Skilled nursing facility                      Prior Function Level of Independence: Needs assistance   Gait / Transfers Assistance Needed: ambulates with rollator  ( 4 wheeled RW)  in room and short distance in the hallway.  ADL's / Homemaking Assistance Needed: performs some  basic ADLs, but has somoene help with bathing, stockings, etc daily  Comments: sleeps in a recliner , and it is a lift chair     Hand Dominance        Extremity/Trunk Assessment        Lower Extremity Assessment Lower Extremity Assessment: Generalized weakness (pt requires min Assist for SLR bliateral LEs , so grossly 3-/5 for LE strength at this time)       Communication   Communication: No difficulties  Cognition Arousal/Alertness: Awake/alert Behavior During Therapy: WFL for tasks assessed/performed Overall Cognitive Status: Within Functional Limits for tasks assessed                                        General Comments      Exercises General Exercises - Lower Extremity Ankle Circles/Pumps: AROM;Left;10 reps;Supine Heel Slides: AAROM;Both;10 reps;Supine Hip  ABduction/ADduction: AAROM;Supine;Both;10 reps Straight Leg Raises: AAROM;Supine;Both;10 reps   Assessment/Plan    PT Assessment Patient needs continued PT services  PT Problem List Decreased strength;Decreased activity tolerance;Decreased mobility       PT Treatment Interventions DME instruction;Functional mobility training;Therapeutic activities;Therapeutic exercise;Patient/family education    PT Goals (Current goals can be found in the Care Plan section)  Acute Rehab PT Goals Patient Stated Goal: I want to stay well and do as much as I can for myself PT Goal Formulation: With patient Time For Goal Achievement: 04/09/21 Potential to Achieve Goals: Good    Frequency Min 2X/week   Barriers to discharge        Co-evaluation               AM-PAC PT "6 Clicks" Mobility  Outcome Measure Help needed turning from your back to your side while in a flat bed without using bedrails?: Total Help needed moving from lying on your back to sitting on the side of a flat bed without using bedrails?: Total Help needed moving to and from a bed to a chair (including a wheelchair)?: Total Help needed standing up from a chair using your arms (e.g., wheelchair or bedside chair)?: Total Help needed to walk in hospital room?: Total Help needed climbing 3-5 steps with a railing? : Total 6 Click Score: 6    End of Session   Activity Tolerance: Patient tolerated treatment well Patient left: in bed (stretcher in ED) Nurse Communication: Mobility status PT Visit Diagnosis: Other abnormalities of gait and mobility (R26.89);Muscle weakness (generalized) (M62.81);History of falling (Z91.81)    Time: 1440-1510 PT Time Calculation (min) (ACUTE ONLY): 30 min   Charges:   PT Evaluation $PT Eval Low Complexity: 1 Low PT Treatments $Therapeutic Activity: 8-22 mins        Janoah Menna, PT, MPT Acute Rehabilitation Services Office: [phone] Pager: [phone] 03/26/2021   Marella Bile 03/26/2021, 3:17 PM

## 2021-03-26 NOTE — Progress Notes (Signed)
.Transition of Care [facility]) - Emergency Department Mini Assessment   Patient Details  Name: [name] [name] MRN: [id number] Date of Birth: [birth year]  Transition of Care [facility]) CM/SW Contact:    Larrie Kass, LCSW Phone Number: 03/26/2021, 11:36 AM   Clinical Narrative: CSW spoke with pt she stated she currently  lives at [facility]. Pt stated she was trying to pick up something off the floor and fell. Pt stated she agrees that she needs a higher level of care. Pt agrees to go back to [facility] for short term rehab, when they have a bed available.  CSW spoke with Ma Hillock [phone]) [facility], she stated pt can return to facility for short term rehab. Pt will need PT evel, prior insurance auth , FL2 , to be completed. CSW informed MD of this.   Valentina Shaggy.Jurney Overacker, MSW, LCSWA [facility]  Transitions of Care Clinical Social Worker I Direct Dial: [phone]  Fax: [phone] [email or web address]   ED Mini Assessment: What brought you to the Emergency Department? : fall               Patient Contact and Communications        ,                 Admission diagnosis:  Fall; Leg Injury Patient Active Problem List   Diagnosis Date Noted   Hyperlipidemia 10/21/2020   Right hip pain 09/01/2020   Blepharitis of both eyes 06/10/2020   Urogenital candidiasis 06/03/2020   Perineal itching, female 02/11/2020   External hemorrhoids 11/05/2019   Rash 10/23/2019   Intermittent left lower quadrant abdominal pain 10/23/2019   Tremor of both hands 09/14/2019   CKD (chronic kidney disease) stage 3, GFR 30-59 ml/min (HCC) 05/11/2019   Recurrent falls 01/14/2018   PVD (peripheral vascular disease) (HCC) 12/21/2017   Chronic bilateral low back pain without sciatica 12/21/2017   Depression, recurrent (HCC) 08/01/2017   Pancytopenia (HCC) 06/29/2017   Depression, major, single episode, moderate  (HCC) 06/29/2017   Osteoporosis 05/25/2017   Acquired hypothyroidism 05/25/2017   B12 deficiency 05/25/2017   Recurrent cystitis 05/25/2017   Chronic back pain 05/25/2017   GERD (gastroesophageal reflux disease) 12/22/2016   Depression with anxiety 12/22/2016   Chronic constipation 10/26/2016   Compression fracture of L2 lumbar vertebra, closed, initial encounter (HCC) 10/21/2016   Thrombocytopenia (HCC) 10/21/2016   Hypertensive heart disease 05/02/2016   Bilateral leg edema 04/30/2016   Exertional dyspnea 09/29/2013   Essential hypertension 01/29/2013   Fatigue with decreased exercise tolerance 12/25/2012   Chronotropic incompetence 12/24/2012   Aortic ejection murmur 12/22/2012   Esophagitis 05/04/2012   GI bleed 05/03/2012   Anemia of chronic disease 05/03/2012   Postop Transfusion 04/27/2012   OA (osteoarthritis) of knee 04/24/2012   PCP:  Mahlon Gammon, MD Pharmacy:   [facility]) - [place], [place] - [address] [facility] [address] [place] [address] [place] [address] Phone: [phone] Fax: [phone]  [id number] [place], [place] - [place] [address] [place] [address] Phone: [phone] Fax: [phone]  [facility] [place], [place] - [address] [place] [address] Phone: [phone] Fax: [phone]  [facility] [place], [place] - [address] [place] [address] Phone: [phone] Fax: [phone]  [facility] - [place], [place] - [address]  [place] [address] Phone: [phone] Fax: [phone]

## 2021-03-26 NOTE — ED Provider Notes (Signed)
[facility] DEPT Provider Note   CSN: [id number] Arrival date & time: 03/26/21  0109     History Chief Complaint  Patient presents with   Fall   Ankle Pain    [name] [name] is a 87 y.o. female.  The history is provided by the patient.  Fall This is a new problem. Episode onset: just prior to arrival. The problem occurs constantly. The problem has been gradually worsening. Pertinent negatives include no chest pain, no abdominal pain and no headaches. The symptoms are aggravated by walking. The symptoms are relieved by rest.  Ankle Pain Associated symptoms: no fever and no neck pain   Patient with history of anxiety, hypertension presents with fall.  Patient reports she tripped and fell on her right leg.  She reports pain throughout the leg.  Denies any head injury or LOC.  No neck pain or any new back pain. She is not anticoagulated.  Patient states a friend's home last    Past Medical History:  Diagnosis Date   Anemia    Anxiety    Chronotropic incompetence 12/2012    potentially medication related; noted on CPET    DDD (degenerative disc disease)    Eczema    GERD (gastroesophageal reflux disease)    H/O hiatal hernia    Hx: UTI (urinary tract infection)    Hyperlipidemia    Hypertension    Hypothyroidism    Osteopenia    Septicemia ([place]) 2002   following UTI   Vertigo, benign positional     Patient Active Problem List   Diagnosis Date Noted   Hyperlipidemia 10/21/2020   Right hip pain 09/01/2020   Blepharitis of both eyes 06/10/2020   Urogenital candidiasis 06/03/2020   Perineal itching, female 02/11/2020   External hemorrhoids 11/05/2019   Rash 10/23/2019   Intermittent left lower quadrant abdominal pain 10/23/2019   Tremor of both hands 09/14/2019   CKD (chronic kidney disease) stage 3, GFR 30-59 ml/min ([place]) 05/11/2019   Recurrent falls 01/14/2018   PVD (peripheral vascular disease) ([place]) 12/21/2017   Chronic bilateral low  back pain without sciatica 12/21/2017   Depression, recurrent ([place]) 08/01/2017   Pancytopenia ([place]) 06/29/2017   Depression, major, single episode, moderate ([place]) 06/29/2017   Osteoporosis 05/25/2017   Acquired hypothyroidism 05/25/2017   B12 deficiency 05/25/2017   Recurrent cystitis 05/25/2017   Chronic back pain 05/25/2017   GERD (gastroesophageal reflux disease) 12/22/2016   Depression with anxiety 12/22/2016   Chronic constipation 10/26/2016   Compression fracture of L2 lumbar vertebra, closed, initial encounter ([place]) 10/21/2016   Thrombocytopenia ([place]) 10/21/2016   Hypertensive heart disease 05/02/2016   Bilateral leg edema 04/30/2016   Exertional dyspnea 09/29/2013   Essential hypertension 01/29/2013   Fatigue with decreased exercise tolerance 12/25/2012   Chronotropic incompetence 12/24/2012   Aortic ejection murmur 12/22/2012   Esophagitis 05/04/2012   GI bleed 05/03/2012   Anemia of chronic disease 05/03/2012   Postop Transfusion 04/27/2012   OA (osteoarthritis) of knee 04/24/2012    Past Surgical History:  Procedure Laterality Date   BREAST SURGERY     left biopsy   CATARACT EXTRACTION Right    2 weeks ago   CPET / MET - PFTS     Consistent with chronotropic incompetence; See attached report in the results section   DILATION AND CURETTAGE OF UTERUS     x 2   DOPPLER ECHOCARDIOGRAPHY  01/10/2013   Normal LV size and function. Normal EF. Air sclerosis but  no stenosis   ESOPHAGOGASTRODUODENOSCOPY  05/04/2012   Procedure: ESOPHAGOGASTRODUODENOSCOPY (EGD);  Surgeon: Inda Castle, MD;  Location: [facility];  Service: Endoscopy;  Laterality: N/A;   EYE SURGERY     cataract extraction with ILO  ? eye   IR KYPHO EA ADDL LEVEL THORACIC OR LUMBAR  10/28/2016   IR KYPHO LUMBAR INC FX REDUCE BONE BX UNI/BIL CANNULATION INC/IMAGING  10/28/2016   IR KYPHO THORACIC WITH BONE BIOPSY  12/24/2016   IR RADIOLOGIST EVAL & MGMT  11/11/2016   KNEE ARTHROSCOPY  2011   right    TONSILLECTOMY     TOTAL KNEE ARTHROPLASTY  04/24/2012   Procedure: TOTAL KNEE ARTHROPLASTY;  Surgeon: Gearlean Alf, MD;  Location: [facility];  Service: Orthopedics;  Laterality: Right;   TRANSTHORACIC ECHOCARDIOGRAM  02/2018   Ordered by PCP for "congestive heart failure " -EF 60 M 65%.  GR 1 DD.  No R WMA--- > ESSENTIALLY NORMAL     OB History   No obstetric history on file.     Family History  Problem Relation Age of Onset   Lung cancer Father 40       Died at age 9   Stroke Mother 58       Died in her late 78s.   Heart attack Mother 33   Diabetes Mother    Arthritis Mother     Social History   Tobacco Use   Smoking status: Former    Years: 0.00    Types: Cigarettes    Quit date: 05/03/1965    Years since quitting: 55.9   Smokeless tobacco: Never  Vaping Use   Vaping Use: Never used  Substance Use Topics   Alcohol use: No    Comment: occ glass wine   Drug use: No    Home Medications Prior to Admission medications   Medication Sig Start Date End Date Taking? Authorizing Provider  acetaminophen (TYLENOL) 500 MG tablet Take 1,000 mg by mouth 3 (three) times daily as needed.     [provider]  ALPRAZolam Duanne Moron) 0.25 MG tablet Take 1 tablet (0.25 mg total) by mouth at bedtime. 04/01/20   Virgie Dad, MD  atorvastatin (LIPITOR) 10 MG tablet Take 5 mg by mouth daily.     [provider]  buPROPion (WELLBUTRIN SR) 150 MG 12 hr tablet Take 1 tablet (150 mg total) by mouth 2 (two) times daily after a meal. 04/10/18   Blanchie Serve, MD  Calcium-Phosphorus-Vitamin D (CITRACAL +D3 PO) Take 2 capsules by mouth 2 (two) times a day. 64m-25mcg(1000u)    [provider]  Camphor-Menthol-Methyl Sal 3.07-31-08 % PTCH Place 1 patch onto the skin. Apply one to lower back daily for back pain    [provider]  Carboxymethylcellulose Sodium (ARTIFICIAL TEARS OP) Apply 1 drop to eye 3 (three) times daily.    [provider]  docusate sodium  (COLACE) 100 MG capsule Take 100 mg by mouth 2 (two) times daily.    [provider]  ferrous sulfate 325 (65 FE) MG EC tablet Take 325 mg by mouth every Monday, Wednesday, and Friday. With a meal    [provider]  fish oil-omega-3 fatty acids 1000 MG capsule Take 1 g by mouth daily.    [provider]  gabapentin (NEURONTIN) 100 MG capsule Take 100 mg by mouth 3 (three) times daily.    [provider]  hydrocortisone cream 1 % Apply 1 application topically daily as needed  for itching. PRN to urogenital itchy area X 10 days than PRN    [provider]  KLOR-CON M20 20 MEQ tablet TAKE 1 TABLET BY MOUTH TWICE A DAY 11/17/18   Virgie Dad, MD  losartan (COZAAR) 50 MG tablet Take 50 mg by mouth daily.     [provider]  nitrofurantoin (MACRODANTIN) 50 MG capsule Take 1 capsule (50 mg total) by mouth daily. 08/27/18   Virgie Dad, MD  nystatin (MYCOSTATIN/NYSTOP) powder Apply 100,000 g topically 2 (two) times daily as needed.     [provider]  nystatin cream (MYCOSTATIN) Apply 1 application topically as needed for dry skin.    [provider]  omeprazole (PRILOSEC) 40 MG capsule Take 40 mg by mouth daily.    [provider]  polyethylene glycol (MIRALAX / GLYCOLAX) 17 g packet Take 17 g by mouth as directed. On Monday and Thursday    [provider]  sertraline (ZOLOFT) 25 MG tablet Take 75 mg by mouth daily. 3 tablets=75 mg daily    [provider]  torsemide (DEMADEX) 20 MG tablet Take 20 mg by mouth daily.    [provider]  traMADol (ULTRAM) 50 MG tablet Take 75 mg by mouth every 6 (six) hours as needed.    [provider]    Allergies    Patient has no known allergies.  Review of Systems   Review of Systems  Constitutional:  Negative for fever.  Cardiovascular:  Negative for chest pain.  Gastrointestinal:  Negative for abdominal pain.  Musculoskeletal:  Positive  for arthralgias and joint swelling. Negative for neck pain.       Chronic back pain   Neurological:  Negative for headaches.  All other systems reviewed and are negative.  Physical Exam Updated Vital Signs BP (!) 146/60   Pulse 66   Temp 97.9 F (36.6 C) (Oral)   Resp 15   Ht 1.626 m ('5\' 4"' )   Wt 78.9 kg   SpO2 99%   BMI 29.87 kg/m   Physical Exam CONSTITUTIONAL: Elderly, uncomfortable appearing HEAD: Normocephalic/atraumatic EYES: EOMI/PERRL ENMT: Mucous membranes moist NECK: supple no meningeal signs SPINE/BACK: Kyphotic spine No C/T/L tenderness, no bruising/crepitance/stepoffs noted to spine CV: S1/S2 noted, no loud murmurs LUNGS: Lungs are clear to auscultation bilaterally, no apparent distress Chest-no bruising or tenderness ABDOMEN: soft, nontender NEURO: Pt is awake/alert/appropriate, moves all extremitiesx4.  No facial droop.  GCS 15 EXTREMITIES: pulses normal/equal, full ROM Tenderness with palpation of right hip.  Tenderness and bruising noted to right knee.  Tenderness and swelling noted to the right ankle.  There are no lacerations  distal cap refill less than 2 seconds and equal in both feet. All other extremities/joints palpated/ranged and nontender SKIN: warm, color normal PSYCH: no abnormalities of mood noted, alert and oriented to situation  ED Results / Procedures / Treatments   Labs (all labs ordered are listed, but only abnormal results are displayed) Labs Reviewed  BASIC METABOLIC PANEL - Abnormal; Notable for the following components:      Result Value   Glucose, Bld 110 (*)    BUN 32 (*)    Creatinine, Ser 1.36 (*)    GFR, Estimated 37 (*)    All other components within normal limits  CBC WITH DIFFERENTIAL/PLATELET - Abnormal; Notable for the following components:   WBC 11.0 (*)    RBC 3.68 (*)    Hemoglobin 10.0 (*)    HCT 33.7 (*)  MCHC 29.7 (*)    RDW 17.8 (*)    Platelets 46 (*)    Neutro Abs 8.6 (*)    Abs Immature Granulocytes  0.23 (*)    All other components within normal limits  RESP PANEL BY RT-PCR (FLU A&B, COVID) [id number]  PROTIME-INR  TYPE AND SCREEN    EKG EKG Interpretation  Date/Time:  Thursday March 26 2021 02:51:34 EDT Ventricular Rate:  63 PR Interval:  213 QRS Duration: 107 QT Interval:  453 QTC Calculation: 464 R Axis:   -28 Text Interpretation: Sinus rhythm Borderline prolonged PR interval Borderline left axis deviation Low voltage, precordial leads Confirmed by Ripley Fraise [phone]) on 03/26/2021 3:22:03 AM  Radiology DG Chest 1 View  Result Date: 03/26/2021 CLINICAL DATA:  Fall. EXAM: CHEST  1 VIEW COMPARISON:  Chest radiograph dated 01/14/2018 FINDINGS: Evaluation is limited due to patient's positioning and superimposition of the mandible over the right upper lobe. No focal consolidation, pleural effusion pneumothorax. Stable cardiac silhouette. No acute osseous pathology. Osteopenia with degenerative changes of the spine. Multilevel thoracic vertebroplasty. IMPRESSION: No acute cardiopulmonary process. Electronically Signed   By: Anner Crete M.D.   On: 03/26/2021 02:45   DG Ankle Complete Right  Result Date: 03/26/2021 CLINICAL DATA:  Fall and right lower extremity pain. EXAM: RIGHT KNEE - COMPLETE 4+ VIEW; RIGHT ANKLE - COMPLETE 3+ VIEW COMPARISON:  None. FINDINGS: There is a mildly displaced fracture of the medial malleolus. Minimally displaced fracture of the lateral malleolus. There is a nondisplaced fracture of the posterior malleolus. No other acute fracture. The bones are osteopenic. There is no dislocation. There is a total right knee arthroplasty. The arthroplasty components appear intact and in anatomic alignment. There is diffuse subcutaneous edema. IMPRESSION: 1. Trimalleolar fracture.  No dislocation. 2. Right knee arthroplasty appears intact. Electronically Signed   By: Anner Crete M.D.   On: 03/26/2021 02:44   DG Knee Complete 4 Views Right  Result Date:  03/26/2021 CLINICAL DATA:  Fall and right lower extremity pain. EXAM: RIGHT KNEE - COMPLETE 4+ VIEW; RIGHT ANKLE - COMPLETE 3+ VIEW COMPARISON:  None. FINDINGS: There is a mildly displaced fracture of the medial malleolus. Minimally displaced fracture of the lateral malleolus. There is a nondisplaced fracture of the posterior malleolus. No other acute fracture. The bones are osteopenic. There is no dislocation. There is a total right knee arthroplasty. The arthroplasty components appear intact and in anatomic alignment. There is diffuse subcutaneous edema. IMPRESSION: 1. Trimalleolar fracture.  No dislocation. 2. Right knee arthroplasty appears intact. Electronically Signed   By: Anner Crete M.D.   On: 03/26/2021 02:44   DG Hip Unilat With Pelvis 2-3 Views Right  Result Date: 03/26/2021 CLINICAL DATA:  Fall and right hip pain. EXAM: DG HIP (WITH OR WITHOUT PELVIS) 2-3V RIGHT COMPARISON:  Right hip radiograph dated 10/16/2020 FINDINGS: There is no acute fracture or dislocation. The bones are osteopenic. Mild bilateral hip arthritic changes. The soft tissues are unremarkable. IMPRESSION: No acute fracture or dislocation. Electronically Signed   By: Anner Crete M.D.   On: 03/26/2021 02:42    Procedures .Ortho Injury Treatment  Date/Time: 03/26/2021 5:00 AM Performed by: Ripley Fraise, MD Authorized by: Ripley Fraise, MD   Consent:    Consent obtained:  VerbalInjury location: ankle Location details: right ankle Injury type: fracture Pre-procedure neurovascular assessment: neurovascularly intact Pre-procedure distal perfusion: normal Pre-procedure neurological function: normal Pre-procedure range of motion: reduced  Anesthesia: Local anesthesia used: no  Patient sedated: NoManipulation performed:  no Immobilization: splint Splint type: short leg Splint Applied by: Ortho Tech Supplies used: Ortho-Glass Post-procedure neurovascular assessment: post-procedure neurovascularly  intact Post-procedure distal perfusion: normal Post-procedure neurological function: normal Post-procedure range of motion: unchanged     Medications Ordered in ED Medications  fentaNYL (SUBLIMAZE) injection 50 mcg (50 mcg Intravenous Given 03/26/21 0453)    ED Course  I have reviewed the triage vital signs and the nursing notes.  Pertinent labs & imaging results that were available during my care of the patient were reviewed by me and considered in my medical decision making (see chart for details).    MDM Rules/Calculators/A&P                           5:25 AM Patient presents after mechanical fall at the nursing facility.  She did not hit her head or have any LOC.  She denies any new back or neck pain.  She had pain throughout her right lower extremity.  Ankle x-ray confirms a stable right trimalleolar fracture.  Patient is neurovascularly intact.  She had bruising over the right knee but there is no acute fracture.  No fracture noted to the hip Patient has been splinted and feels improved.  I discussed the case with Dr. Zachery Dakins w/ Sports Med He requests repeat x-ray after splint placement.  We will follow-up in about an hour to determine final disposition but she may be able to be discharged for delayed repair. 6:24 AM Discussed with Dr. Zachery Dakins with sports medicine He is reviewed x-rays.  Patient has appropriate alignment and will not need immediate operative management.  She can follow-up next week On reassessment, patient is now having more pain in her left side.  She has no tenderness on  range of motion of left hip and there is no obvious fracture on previous x-rays.  She appears to have pain in her left flank but no obvious bruising.  She is now complaining headaches but she did not have previously.  Patient is now an unreliable historian and will need to proceed with CT imaging. 7:13 AM Pt reports "pain all over" CT imaging pending Signed out to Dr. Ronnald Nian at shift  change If imaging negative, anticipate d/c back to facility Final Clinical Impression(s) / ED Diagnoses Final diagnoses:  Closed trimalleolar fracture of right ankle, initial encounter  Pain  Other headache syndrome  Left flank pain    Rx / DC Orders ED Discharge Orders     None        Ripley Fraise, MD 03/26/21 [phone]

## 2021-03-26 NOTE — NC FL2 (Signed)
[place] MEDICAID FL2 LEVEL OF CARE SCREENING TOOL     IDENTIFICATION  Patient Name: [name] [name] Birthdate: 08-29-1930 Sex: female Admission Date (Current Location): 03/26/2021  [facility] and [place] Number:  Producer, television/film/video and Address:  [facility],  [address] [place], [place] [address]      Provider Number: [id number]  Attending Physician Name and Address:  Virgina Norfolk, DO  Relative Name and Phone Number:  ,Inis Sizer [phone]   [phone] ,Jerald Kief- [phone] ,[phone] , Chanetta Marshall- Son [phone]  [phone]  Guadelupe Sabin     [phone]    Current Level of Care: Hospital Recommended Level of Care: Skilled Nursing Facility Prior Approval Number:    Date Approved/Denied:   PASRR Number: [id number]  Discharge Plan: SNF    Current Diagnoses: Patient Active Problem List   Diagnosis Date Noted   Hyperlipidemia 10/21/2020   Right hip pain 09/01/2020   Blepharitis of both eyes 06/10/2020   Urogenital candidiasis 06/03/2020   Perineal itching, female 02/11/2020   External hemorrhoids 11/05/2019   Rash 10/23/2019   Intermittent left lower quadrant abdominal pain 10/23/2019   Tremor of both hands 09/14/2019   CKD (chronic kidney disease) stage 3, GFR 30-59 ml/min ([facility]) 05/11/2019   Recurrent falls 01/14/2018   PVD (peripheral vascular disease) ([facility]) 12/21/2017   Chronic bilateral low back pain without sciatica 12/21/2017   Depression, recurrent ([facility]) 08/01/2017   Pancytopenia ([facility]) 06/29/2017   Depression, major, single episode, moderate ([facility]) 06/29/2017   Osteoporosis 05/25/2017   Acquired hypothyroidism 05/25/2017   B12 deficiency 05/25/2017   Recurrent cystitis 05/25/2017   Chronic back pain 05/25/2017   GERD (gastroesophageal reflux disease) 12/22/2016   Depression with anxiety 12/22/2016   Chronic constipation 10/26/2016   Compression fracture of L2 lumbar vertebra, closed, initial encounter ([facility]) 10/21/2016   Thrombocytopenia ([facility])  10/21/2016   Hypertensive heart disease 05/02/2016   Bilateral leg edema 04/30/2016   Exertional dyspnea 09/29/2013   Essential hypertension 01/29/2013   Fatigue with decreased exercise tolerance 12/25/2012   Chronotropic incompetence 12/24/2012   Aortic ejection murmur 12/22/2012   Esophagitis 05/04/2012   GI bleed 05/03/2012   Anemia of chronic disease 05/03/2012   Postop Transfusion 04/27/2012   OA (osteoarthritis) of knee 04/24/2012    Orientation RESPIRATION BLADDER Height & Weight     Self, Time, Situation, Place  Normal Continent Weight: 174 lb (78.9 kg) Height:  5\' 4"  (162.6 cm)  BEHAVIORAL SYMPTOMS/MOOD NEUROLOGICAL BOWEL NUTRITION STATUS      Continent Diet (regular)  AMBULATORY STATUS COMMUNICATION OF NEEDS Skin   Extensive Assist Verbally Normal                       Personal Care Assistance Level of Assistance  Bathing, Feeding, Dressing, Total care Bathing Assistance: Maximum assistance Feeding assistance: Independent Dressing Assistance: Limited assistance Total Care Assistance: Maximum assistance   Functional Limitations Info  Sight, Hearing, Speech Sight Info: Adequate Hearing Info: Adequate Speech Info: Adequate    SPECIAL CARE FACTORS FREQUENCY  PT (By licensed PT), OT (By licensed OT)     PT Frequency: 5x a week OT Frequency: 5x a week            Contractures Contractures Info: Not present    Additional Factors Info  Code Status, Allergies Code Status Info: DNR Allergies Info: No Known Allergies           Current Medications (03/26/2021):  This is the current hospital active medication list  Current Facility-Administered Medications  Medication Dose Route Frequency Provider Last Rate Last Admin   acetaminophen (TYLENOL) tablet 1,000 mg  1,000 mg Oral TID PRN Curatolo, Adam, DO       ALPRAZolam Prudy Feeler) tablet 0.25 mg  0.25 mg Oral QHS Curatolo, Adam, DO       atorvastatin (LIPITOR) tablet 5 mg  5 mg Oral Daily Curatolo, Adam, DO    5 mg at 03/26/21 1155   buPROPion (WELLBUTRIN SR) 12 hr tablet 150 mg  150 mg Oral BID PC Curatolo, Adam, DO   150 mg at 03/26/21 1153   docusate sodium (COLACE) capsule 100 mg  100 mg Oral BID Curatolo, Adam, DO   100 mg at 03/26/21 1154   fentaNYL (SUBLIMAZE) injection 50 mcg  50 mcg Intravenous Q30 min PRN Zadie Rhine, MD   50 mcg at 03/26/21 0453   [START ON 03/27/2021] ferrous sulfate tablet 325 mg  325 mg Oral Q M,W,F Curatolo, Adam, DO       gabapentin (NEURONTIN) capsule 100 mg  100 mg Oral TID Lockie Mola, Adam, DO   100 mg at 03/26/21 1153   [START ON 03/27/2021] levothyroxine (SYNTHROID) tablet 75 mcg  75 mcg Oral Q0600 Rexford Maus, RPH       losartan (COZAAR) tablet 50 mg  50 mg Oral Daily Curatolo, Adam, DO   50 mg at 03/26/21 1303   pantoprazole (PROTONIX) EC tablet 40 mg  40 mg Oral Daily Curatolo, Adam, DO   40 mg at 03/26/21 1155   sertraline (ZOLOFT) tablet 75 mg  75 mg Oral Daily Curatolo, Adam, DO   75 mg at 03/26/21 1157   torsemide (DEMADEX) tablet 20 mg  20 mg Oral Daily Curatolo, Adam, DO   20 mg at 03/26/21 1303   traMADol (ULTRAM) tablet 75 mg  75 mg Oral Q6H PRN Virgina Norfolk, DO       Current Outpatient Medications  Medication Sig Dispense Refill   acetaminophen (TYLENOL) 500 MG tablet Take 1,000 mg by mouth 3 (three) times daily as needed for mild pain.     ALPRAZolam (XANAX) 0.25 MG tablet Take 1 tablet (0.25 mg total) by mouth at bedtime. (Patient taking differently: Take 0.25 mg by mouth at bedtime as needed for sleep or anxiety.) 30 tablet 0   atorvastatin (LIPITOR) 10 MG tablet Take 5 mg by mouth daily.      buPROPion (WELLBUTRIN SR) 150 MG 12 hr tablet Take 1 tablet (150 mg total) by mouth 2 (two) times daily after a meal. 180 tablet 3   calcium citrate-vitamin D (CALCIUM CITRATE + D) 315-200 MG-UNIT tablet Take 1 tablet by mouth 2 (two) times daily.     Camphor-Menthol-Methyl Sal 3.07-31-08 % PTCH Place 1 patch onto the skin daily. Apply one to lower back  daily for back pain     Carboxymethylcellulose Sodium (ARTIFICIAL TEARS OP) Apply 1 drop to eye 3 (three) times daily as needed (dry eyes).     docusate sodium (COLACE) 100 MG capsule Take 100 mg by mouth 2 (two) times daily.     ferrous sulfate 325 (65 FE) MG EC tablet Take 325 mg by mouth every Monday, Wednesday, and Friday. With a meal     fish oil-omega-3 fatty acids 1000 MG capsule Take 1 g by mouth daily.     gabapentin (NEURONTIN) 100 MG capsule Take 100 mg by mouth 3 (three) times daily.     hydrocortisone cream 1 % Apply 1 application topically daily as needed  for itching.     KLOR-CON M20 20 MEQ tablet TAKE 1 TABLET BY MOUTH TWICE A DAY (Patient taking differently: Take 20 mEq by mouth 2 (two) times daily.) 60 tablet 1   levothyroxine (SYNTHROID) 75 MCG tablet Take 75 mcg by mouth daily.     losartan (COZAAR) 25 MG tablet Take 25 mg by mouth daily.     Melaton-Thean-Cham-PassF-LBalm (MELATONIN + L-THEANINE PO) Take 1 tablet by mouth at bedtime.     nitrofurantoin (MACRODANTIN) 50 MG capsule Take 50 mg by mouth daily.     nystatin (MYCOSTATIN/NYSTOP) powder Apply 1 application topically 2 (two) times daily as needed (yeast).     nystatin cream (MYCOSTATIN) Apply 1 application topically as needed for dry skin.     omeprazole (PRILOSEC) 40 MG capsule Take 40 mg by mouth daily.     polyethylene glycol (MIRALAX / GLYCOLAX) 17 g packet Take 17 g by mouth as directed. On Monday and Thursday     sertraline (ZOLOFT) 25 MG tablet Take 75 mg by mouth daily.     torsemide (DEMADEX) 20 MG tablet Take 20 mg by mouth daily.     traMADol (ULTRAM) 50 MG tablet Take 75 mg by mouth every 6 (six) hours as needed for moderate pain.       Discharge Medications: Please see discharge summary for a list of discharge medications.  Relevant Imaging Results:  Relevant Lab Results:   Additional Information SSN [id number]  Valentina Shaggy Brenly Trawick, LCSW

## 2021-03-27 ENCOUNTER — Inpatient Hospital Stay (HOSPITAL_COMMUNITY): Payer: Medicare Other | Admitting: Anesthesiology

## 2021-03-27 ENCOUNTER — Encounter (HOSPITAL_COMMUNITY)
Admission: EM | Disposition: A | Discharge status: Skilled Nursing Facility | Payer: Self-pay | Source: Skilled Nursing Facility | Attending: Family Medicine

## 2021-03-27 DIAGNOSIS — S82851A Displaced trimalleolar fracture of right lower leg, initial encounter for closed fracture: Principal | ICD-10-CM

## 2021-03-27 HISTORY — PX: ORIF ANKLE FRACTURE: SHX5408

## 2021-03-27 LAB — COMPREHENSIVE METABOLIC PANEL
ALT: 16 U/L (ref 0–44)
AST: 21 U/L (ref 15–41)
Albumin: 3.7 g/dL (ref 3.5–5.0)
Alkaline Phosphatase: 69 U/L (ref 38–126)
Anion gap: 10 (ref 5–15)
BUN: 31 mg/dL — ABNORMAL HIGH (ref 8–23)
CO2: 27 mmol/L (ref 22–32)
Calcium: 9.6 mg/dL (ref 8.9–10.3)
Chloride: 104 mmol/L (ref 98–111)
Creatinine, Ser: 1.29 mg/dL — ABNORMAL HIGH (ref 0.44–1.00)
GFR, Estimated: 40 mL/min — ABNORMAL LOW (ref >60)
Glucose, Bld: 112 mg/dL — ABNORMAL HIGH (ref 70–99)
Potassium: 3.6 mmol/L (ref 3.5–5.1)
Sodium: 141 mmol/L (ref 135–145)
Total Bilirubin: 0.4 mg/dL (ref 0.3–1.2)
Total Protein: 7.4 g/dL (ref 6.5–8.1)

## 2021-03-27 LAB — CBC
HCT: 27.5 % — ABNORMAL LOW (ref 36.0–46.0)
Hemoglobin: 8.5 g/dL — ABNORMAL LOW (ref 12.0–15.0)
MCH: 27 pg (ref 26.0–34.0)
MCHC: 30.9 g/dL (ref 30.0–36.0)
MCV: 87.3 fL (ref 80.0–100.0)
Platelets: 95 10*3/uL — ABNORMAL LOW (ref 150–400)
RBC: 3.15 MIL/uL — ABNORMAL LOW (ref 3.87–5.11)
RDW: 17.6 % — ABNORMAL HIGH (ref 11.5–15.5)
WBC: 9.4 10*3/uL (ref 4.0–10.5)
nRBC: 0 % (ref 0.0–0.2)

## 2021-03-27 LAB — PATHOLOGIST SMEAR REVIEW

## 2021-03-27 SURGERY — OPEN REDUCTION INTERNAL FIXATION (ORIF) ANKLE FRACTURE
Anesthesia: General | Site: Ankle | Laterality: Right

## 2021-03-27 MED ORDER — 0.9 % SODIUM CHLORIDE (POUR BTL) OPTIME
TOPICAL | Status: DC | PRN
  Administered 2021-03-27: 1000 mL

## 2021-03-27 MED ORDER — PROPOFOL 10 MG/ML IV BOLUS
INTRAVENOUS | Status: DC | PRN
  Administered 2021-03-27: 100 mg via INTRAVENOUS

## 2021-03-27 MED ORDER — FENTANYL CITRATE (PF) 100 MCG/2ML IJ SOLN
INTRAMUSCULAR | Status: DC | PRN
  Administered 2021-03-27: 50 ug via INTRAVENOUS

## 2021-03-27 MED ORDER — OXYCODONE HCL 5 MG PO TABS: 5.0000 mg | ORAL_TABLET | Freq: Once | ORAL | Status: DC | PRN

## 2021-03-27 MED ORDER — FENTANYL CITRATE PF 50 MCG/ML IJ SOSY: 25.0000 ug | PREFILLED_SYRINGE | INTRAMUSCULAR | Status: DC | PRN

## 2021-03-27 MED ORDER — POTASSIUM CHLORIDE IN NACL 20-0.45 MEQ/L-% IV SOLN
INTRAVENOUS | Status: DC
  Filled 2021-03-27 (×2): qty 1000

## 2021-03-27 MED ORDER — LACTATED RINGERS IV SOLN: INTRAVENOUS | Status: DC

## 2021-03-27 MED ORDER — POVIDONE-IODINE 10 % EX SWAB
2.0000 "application " | Freq: Once | CUTANEOUS | Status: AC
  Administered 2021-03-27: 2 via TOPICAL

## 2021-03-27 MED ORDER — EPHEDRINE SULFATE-NACL 50-0.9 MG/10ML-% IV SOSY
PREFILLED_SYRINGE | INTRAVENOUS | Status: DC | PRN
  Administered 2021-03-27: 10 mg via INTRAVENOUS
  Administered 2021-03-27: 5 mg via INTRAVENOUS
  Administered 2021-03-27: 10 mg via INTRAVENOUS

## 2021-03-27 MED ORDER — METOCLOPRAMIDE HCL 5 MG PO TABS: 5.0000 mg | ORAL_TABLET | Freq: Three times a day (TID) | ORAL | Status: DC | PRN

## 2021-03-27 MED ORDER — METOCLOPRAMIDE HCL 5 MG/ML IJ SOLN: 5.0000 mg | Freq: Three times a day (TID) | INTRAMUSCULAR | Status: DC | PRN

## 2021-03-27 MED ORDER — FLEET ENEMA 7-19 GM/118ML RE ENEM: 1.0000 | ENEMA | Freq: Once | RECTAL | Status: DC | PRN

## 2021-03-27 MED ORDER — PROPOFOL 10 MG/ML IV BOLUS
INTRAVENOUS | Status: AC
  Filled 2021-03-27: qty 20

## 2021-03-27 MED ORDER — FENTANYL CITRATE (PF) 100 MCG/2ML IJ SOLN
INTRAMUSCULAR | Status: AC
  Filled 2021-03-27: qty 2

## 2021-03-27 MED ORDER — LIDOCAINE 2% (20 MG/ML) 5 ML SYRINGE
INTRAMUSCULAR | Status: DC | PRN
  Administered 2021-03-27: 40 mg via INTRAVENOUS

## 2021-03-27 MED ORDER — GLYCOPYRROLATE 0.2 MG/ML IJ SOLN
INTRAMUSCULAR | Status: AC
  Filled 2021-03-27: qty 1

## 2021-03-27 MED ORDER — ONDANSETRON HCL 4 MG/2ML IJ SOLN: 4.0000 mg | Freq: Once | INTRAMUSCULAR | Status: DC | PRN

## 2021-03-27 MED ORDER — PHENYLEPHRINE 40 MCG/ML (10ML) SYRINGE FOR IV PUSH (FOR BLOOD PRESSURE SUPPORT)
PREFILLED_SYRINGE | INTRAVENOUS | Status: DC | PRN
  Administered 2021-03-27: 160 ug via INTRAVENOUS

## 2021-03-27 MED ORDER — DEXAMETHASONE SODIUM PHOSPHATE 10 MG/ML IJ SOLN
INTRAMUSCULAR | Status: AC
  Filled 2021-03-27: qty 1

## 2021-03-27 MED ORDER — ONDANSETRON HCL 4 MG/2ML IJ SOLN
INTRAMUSCULAR | Status: AC
  Filled 2021-03-27: qty 2

## 2021-03-27 MED ORDER — PHENYLEPHRINE HCL (PRESSORS) 10 MG/ML IV SOLN
INTRAVENOUS | Status: AC
  Filled 2021-03-27: qty 2

## 2021-03-27 MED ORDER — ONDANSETRON HCL 4 MG/2ML IJ SOLN: 4.0000 mg | Freq: Four times a day (QID) | INTRAMUSCULAR | Status: DC | PRN

## 2021-03-27 MED ORDER — SENNA 8.6 MG PO TABS
1.0000 | ORAL_TABLET | Freq: Two times a day (BID) | ORAL | Status: DC
  Administered 2021-03-27 – 2021-03-31 (×9): 8.6 mg via ORAL
  Filled 2021-03-27 (×9): qty 1

## 2021-03-27 MED ORDER — OXYCODONE HCL 5 MG/5ML PO SOLN
5.0000 mg | Freq: Once | ORAL | Status: DC | PRN
Start: 2021-03-27 — End: 2021-03-27

## 2021-03-27 MED ORDER — ONDANSETRON HCL 4 MG/2ML IJ SOLN
INTRAMUSCULAR | Status: DC | PRN
Start: 2021-03-27 — End: 2021-03-27
  Administered 2021-03-27: 4 mg via INTRAVENOUS

## 2021-03-27 MED ORDER — DEXAMETHASONE SODIUM PHOSPHATE 10 MG/ML IJ SOLN
INTRAMUSCULAR | Status: DC | PRN
  Administered 2021-03-27: 5 mg

## 2021-03-27 MED ORDER — GLYCOPYRROLATE PF 0.2 MG/ML IJ SOSY
PREFILLED_SYRINGE | INTRAMUSCULAR | Status: DC | PRN
Start: 2021-03-27 — End: 2021-03-27
  Administered 2021-03-27: .2 mg via INTRAVENOUS

## 2021-03-27 MED ORDER — BISACODYL 10 MG RE SUPP: 10.0000 mg | Freq: Every day | RECTAL | Status: DC | PRN

## 2021-03-27 MED ORDER — EPHEDRINE 5 MG/ML INJ
INTRAVENOUS | Status: AC
  Filled 2021-03-27: qty 5

## 2021-03-27 MED ORDER — CEFAZOLIN SODIUM-DEXTROSE 2-4 GM/100ML-% IV SOLN
2.0000 g | INTRAVENOUS | Status: AC
  Administered 2021-03-27: 2 g via INTRAVENOUS
  Filled 2021-03-27: qty 100

## 2021-03-27 MED ORDER — CHLORHEXIDINE GLUCONATE 0.12 % MT SOLN
15.0000 mL | Freq: Once | OROMUCOSAL | Status: AC
  Administered 2021-03-27: 15 mL via OROMUCOSAL

## 2021-03-27 MED ORDER — LIDOCAINE 2% (20 MG/ML) 5 ML SYRINGE
INTRAMUSCULAR | Status: AC
  Filled 2021-03-27: qty 5

## 2021-03-27 MED ORDER — ACETAMINOPHEN 500 MG PO TABS
1000.0000 mg | ORAL_TABLET | Freq: Once | ORAL | Status: AC
  Administered 2021-03-27: 1000 mg via ORAL
  Filled 2021-03-27: qty 2

## 2021-03-27 MED ORDER — CEFAZOLIN SODIUM-DEXTROSE 2-4 GM/100ML-% IV SOLN
2.0000 g | Freq: Four times a day (QID) | INTRAVENOUS | Status: AC
Start: 2021-03-27 — End: 2021-03-28
  Administered 2021-03-27 – 2021-03-28 (×3): 2 g via INTRAVENOUS
  Filled 2021-03-27 (×5): qty 100

## 2021-03-27 MED ORDER — PHENYLEPHRINE HCL-NACL 20-0.9 MG/250ML-% IV SOLN
INTRAVENOUS | Status: DC | PRN
  Administered 2021-03-27: 50 ug/min via INTRAVENOUS

## 2021-03-27 MED ORDER — PHENYLEPHRINE 40 MCG/ML (10ML) SYRINGE FOR IV PUSH (FOR BLOOD PRESSURE SUPPORT)
PREFILLED_SYRINGE | INTRAVENOUS | Status: AC
  Filled 2021-03-27: qty 10

## 2021-03-27 MED ORDER — ROPIVACAINE HCL 5 MG/ML IJ SOLN
INTRAMUSCULAR | Status: DC | PRN
  Administered 2021-03-27: 30 mL via PERINEURAL
  Administered 2021-03-27: 10 mL via PERINEURAL

## 2021-03-27 MED ORDER — POLYETHYLENE GLYCOL 3350 17 G PO PACK: 17.0000 g | PACK | Freq: Every day | ORAL | Status: DC | PRN

## 2021-03-27 MED ORDER — ONDANSETRON HCL 4 MG PO TABS: 4.0000 mg | ORAL_TABLET | Freq: Four times a day (QID) | ORAL | Status: DC | PRN

## 2021-03-27 MED ORDER — CHLORHEXIDINE GLUCONATE 4 % EX LIQD
60.0000 mL | Freq: Once | CUTANEOUS | Status: DC
  Filled 2021-03-27: qty 60

## 2021-03-27 MED ORDER — FENTANYL CITRATE PF 50 MCG/ML IJ SOSY
100.0000 ug | PREFILLED_SYRINGE | Freq: Once | INTRAMUSCULAR | Status: AC
  Administered 2021-03-27: 25 ug via INTRAVENOUS
  Filled 2021-03-27: qty 2

## 2021-03-27 MED ORDER — MIDAZOLAM HCL 2 MG/2ML IJ SOLN
1.0000 mg | INTRAMUSCULAR | Status: DC
Start: 2021-03-27 — End: 2021-03-27
  Filled 2021-03-27: qty 2

## 2021-03-27 MED ORDER — DIPHENHYDRAMINE HCL 12.5 MG/5ML PO ELIX: 12.5000 mg | ORAL_SOLUTION | ORAL | Status: DC | PRN

## 2021-03-27 SURGICAL SUPPLY — 58 items
ANCH SUT 2 SHRT 1.45 DRLBT (Anchor) ×1 IMPLANT
ANCHOR JUGGERKNOT W/DRL 2/1.45 (Anchor) ×1 IMPLANT
APL SKNCLS STERI-STRIP NONHPOA (GAUZE/BANDAGES/DRESSINGS) ×1
BAG COUNTER SPONGE SURGICOUNT (BAG) IMPLANT
BAG SPEC THK2 15X12 ZIP CLS (MISCELLANEOUS) ×1
BAG SPNG CNTER NS LX DISP (BAG)
BAG ZIPLOCK 12X15 (MISCELLANEOUS) ×2 IMPLANT
BENZOIN TINCTURE PRP APPL 2/3 (GAUZE/BANDAGES/DRESSINGS) ×2 IMPLANT
BIT DRILL 110X2.5XQCK CNCT (BIT) IMPLANT
BIT DRILL 2.5 (BIT) ×2
BIT DRILL 2.7XCANN QCK CNCT (BIT) IMPLANT
BIT DRILL CANN 2.7 (BIT) ×2
BIT DRILL STD 2.0MM (DRILL) IMPLANT
BIT DRL 110X2.5XQCK CNCT (BIT) ×1
BIT DRL 2.7XCANN QCK CNCT (BIT) ×1
BNDG ELASTIC 4X5.8 VLCR STR LF (GAUZE/BANDAGES/DRESSINGS) ×1 IMPLANT
BNDG ELASTIC 6X5.8 VLCR STR LF (GAUZE/BANDAGES/DRESSINGS) ×1 IMPLANT
COVER SURGICAL LIGHT HANDLE (MISCELLANEOUS) ×2 IMPLANT
CUFF TOURN SGL QUICK 34 (TOURNIQUET CUFF) ×2
CUFF TRNQT CYL 34X4.125X (TOURNIQUET CUFF) ×1 IMPLANT
DRAPE C-ARM 42X120 X-RAY (DRAPES) ×2 IMPLANT
DRAPE OEC MINIVIEW 54X84 (DRAPES) ×1 IMPLANT
DRAPE U-SHAPE 47X51 STRL (DRAPES) ×2 IMPLANT
DRILL STANDARD 2.0MM (DRILL) ×2
DRSG ADAPTIC 3X8 NADH LF (GAUZE/BANDAGES/DRESSINGS) ×2 IMPLANT
DRSG PAD ABDOMINAL 8X10 ST (GAUZE/BANDAGES/DRESSINGS) ×3 IMPLANT
ELECT REM PT RETURN 15FT ADLT (MISCELLANEOUS) ×2 IMPLANT
GAUZE SPONGE 4X4 12PLY STRL (GAUZE/BANDAGES/DRESSINGS) ×2 IMPLANT
GAUZE XEROFORM 5X9 LF (GAUZE/BANDAGES/DRESSINGS) ×1 IMPLANT
GLOVE SRG 8 PF TXTR STRL LF DI (GLOVE) ×1 IMPLANT
GLOVE SURG NEOP MICRO LF SZ7.5 (GLOVE) ×2 IMPLANT
GLOVE SURG ORTHO LTX SZ7.5 (GLOVE) ×2 IMPLANT
GLOVE SURG UNDER POLY LF SZ8 (GLOVE) ×2
GOWN STRL REUS W/TWL LRG LVL3 (GOWN DISPOSABLE) ×2 IMPLANT
GUIDEWIRE PIN ORTH 6X1.6XSMTH (WIRE) IMPLANT
K-WIRE 1.6 (WIRE) ×2
KIT TURNOVER KIT A (KITS) ×2 IMPLANT
PACK ORTHO EXTREMITY (CUSTOM PROCEDURE TRAY) ×2 IMPLANT
PAD CAST 4YDX4 CTTN HI CHSV (CAST SUPPLIES) ×2 IMPLANT
PADDING CAST COTTON 4X4 STRL (CAST SUPPLIES) ×2
PADDING CAST COTTON 6X4 STRL (CAST SUPPLIES) ×1 IMPLANT
PENCIL SMOKE EVACUATOR (MISCELLANEOUS) IMPLANT
PLATE TIBULA RT PERIART 4H (Plate) ×1 IMPLANT
PROTECTOR NERVE ULNAR (MISCELLANEOUS) ×2 IMPLANT
SCREW CANCELLOUS FT 4.0X14 (Screw) ×1 IMPLANT
SCREW CANN 1/3 THRD RVRS CT (Screw) IMPLANT
SCREW CANNULATED 4.0X40 (Screw) ×2 IMPLANT
SCREW LOCK 14X2.7X NS (Screw) IMPLANT
SCREW LOCK 16X2.7X (Screw) IMPLANT
SCREW LOCKING 2.7X14 (Screw) ×2 IMPLANT
SCREW LOCKING 2.7X16 (Screw) ×6 IMPLANT
SCREW PERI 3.5X14MM W/2.7 (Screw) ×3 IMPLANT
STRIP CLOSURE SKIN 1/2X4 (GAUZE/BANDAGES/DRESSINGS) ×2 IMPLANT
SUT ETHILON 3 0 PS 1 (SUTURE) ×3 IMPLANT
SUT MNCRL AB 4-0 PS2 18 (SUTURE) ×2 IMPLANT
SUT VIC AB 3-0 SH 8-18 (SUTURE) ×2 IMPLANT
TOWEL OR 17X26 10 PK STRL BLUE (TOWEL DISPOSABLE) ×4 IMPLANT
WASHER SM (Washer) ×1 IMPLANT

## 2021-03-27 NOTE — Progress Notes (Signed)
AssistedDr. Carolyn Witman with right, ultrasound guided, popliteal, adductor canal block. Side rails up, monitors on throughout procedure. See vital signs in flow sheet. Tolerated Procedure well.  

## 2021-03-27 NOTE — Progress Notes (Signed)
[facility]  PROGRESS NOTE  [name] [name] ZOX:[id number] DOB: 10/31/1930 DOA: 03/26/2021 PCP: Mahlon GammonGupta, Anjali L, MD   Brief HPI:   87 year old female with history of anxiety, hyperlipidemia, hypertension, hypothyroidism presented with ankle pain after fall.  X-ray of ankle showed trimalleolar fracture.  Orthopedics was consulted.  Plan for ORIF today.    Subjective   Complains of ankle pain.   Assessment/Plan:     Right trimalleolar fracture -Orthopedics consulted -ORIF today -Pain control  Pancytopenia -Unclear etiology -Follow CBC in a.m. -Peripheral smear shows normocytic anemia and thrombocytopenia -We will need hematology consultation as outpatient  Hypertension -Blood pressure is stable -Continue Demadex  Anxiety -Continue home regimen  CKD stage III -Creatinine at baseline  Hypothyroidism -Continue Synthroid  Scheduled medications:    [MAR Hold] ALPRAZolam  0.25 mg Oral QHS   [MAR Hold] atorvastatin  5 mg Oral Daily   [MAR Hold] buPROPion  150 mg Oral BID PC   [MAR Hold] calcium-vitamin D  1 tablet Oral BID   chlorhexidine  60 mL Topical Once   [MAR Hold] docusate sodium  100 mg Oral BID   [MAR Hold] ferrous sulfate  325 mg Oral Q M,W,F   [MAR Hold] gabapentin  100 mg Oral TID   [MAR Hold] levothyroxine  75 mcg Oral Q0600   [MAR Hold] losartan  25 mg Oral Daily   midazolam  1-2 mg Intravenous UD   [MAR Hold] omega-3 acid ethyl esters  1 g Oral Daily   [MAR Hold] pantoprazole  40 mg Oral Daily   [MAR Hold] sertraline  75 mg Oral Daily   [MAR Hold] torsemide  20 mg Oral Daily         Data Reviewed:   CBG:  No results for input(s): GLUCAP in the last 168 hours.  SpO2: 95 % O2 Flow Rate (L/min): 2 L/min    Vitals:   03/27/21 1246 03/27/21 1342 03/27/21 1343 03/27/21 1358  BP: (!) 140/44 (!) 125/52  (!) 128/58  Pulse: 72 63 67 64  Resp: 16 14 (!) 24 16  Temp: 97.8 F (36.6 C)     TempSrc: Oral     SpO2: 97% 96% 96% 95%  Weight:       Height:         Intake/Output Summary (Last 24 hours) at 03/27/2021 1422 Last data filed at 03/27/2021 0100 Gross per 24 hour  Intake 340 ml  Output 700 ml  Net -360 ml    08/31 1901 - 09/02 0700 In: 340 [P.O.:340] Out: 700   Filed Weights   03/26/21 0119 03/26/21 1645  Weight: 78.9 kg 82.9 kg    CBC:  Recent Labs  Lab 03/26/21 0334 03/27/21 0543  WBC 11.0* 9.4  HGB 10.0* 8.5*  HCT 33.7* 27.5*  PLT 46* 95*  MCV 91.6 87.3  MCH 27.2 27.0  MCHC 29.7* 30.9  RDW 17.8* 17.6*  LYMPHSABS 1.2  --   MONOABS 1.0  --   EOSABS 0.0  --   BASOSABS 0.1  --     Complete metabolic panel:  Recent Labs  Lab 03/26/21 0334 03/26/21 0430 03/27/21 0543  NA 136  --  141  K 4.3  --  3.6  CL 104  --  104  CO2 23  --  27  GLUCOSE 110*  --  112*  BUN 32*  --  31*  CREATININE 1.36*  --  1.29*  CALCIUM 9.8  --  9.6  AST  --   --  21  ALT  --   --  16  ALKPHOS  --   --  69  BILITOT  --   --  0.4  ALBUMIN  --   --  3.7  INR  --  1.0  --     No results for input(s): LIPASE, AMYLASE in the last 168 hours.  Recent Labs  Lab 03/26/21 0334  SARSCOV2NAA NEGATIVE    ------------------------------------------------------------------------------------------------------------------ No results for input(s): CHOL, HDL, LDLCALC, TRIG, CHOLHDL, LDLDIRECT in the last 72 hours.  No results found for: HGBA1C ------------------------------------------------------------------------------------------------------------------ No results for input(s): TSH, T4TOTAL, T3FREE, THYROIDAB in the last 72 hours.  Invalid input(s): FREET3 ------------------------------------------------------------------------------------------------------------------ No results for input(s): VITAMINB12, FOLATE, FERRITIN, TIBC, IRON, RETICCTPCT in the last 72 hours.  Coagulation profile Recent Labs  Lab 03/26/21 0430  INR 1.0   No results for input(s): DDIMER in the last 72 hours.  Cardiac Enzymes No results  for input(s): CKTOTAL, CKMB, CKMBINDEX, TROPONINI in the last 168 hours.  ------------------------------------------------------------------------------------------------------------------ No results found for: BNP   Antibiotics: Anti-infectives (From admission, onward)    Start     Dose/Rate Route Frequency Ordered Stop   03/27/21 0630  ceFAZolin (ANCEF) IVPB 2g/100 mL premix        2 g 200 mL/hr over 30 Minutes Intravenous On call to O.R. 03/27/21 0537 03/28/21 0559        Radiology Reports  CT ABDOMEN PELVIS WO CONTRAST  Result Date: 03/26/2021 CLINICAL DATA:  Abdominal trauma.  Back pain after fall last night EXAM: CT ABDOMEN AND PELVIS WITHOUT CONTRAST TECHNIQUE: Multidetector CT imaging of the abdomen and pelvis was performed following the standard protocol without IV contrast. COMPARISON:  01/14/2018 FINDINGS: Lower chest: Atheromatous calcification of the aorta and coronaries. Small sliding hiatal hernia. Hepatobiliary: No focal liver abnormality.No evidence of biliary obstruction or stone. Pancreas: 2 cm cystic density projecting posteriorly from the pancreas, non progressed and non worrisome given patient age. No visible injury. Spleen: Unremarkable. Adrenals/Urinary Tract: Negative adrenals. No hydronephrosis or stone. Unremarkable bladder with moderate distension. Stomach/Bowel:  No obstruction. No visible injury or inflammation. Vascular/Lymphatic: Diffuse atheromatous calcification of the aorta, iliacs, and branch vessels. No acute vascular finding. No mass or adenopathy. Reproductive:No pathologic findings. Other: No ascites or pneumoperitoneum. Musculoskeletal: No acute finding. Prior T9, T11, L1, and L2 compression fractures with cement augmentation. Generalized spinal degeneration with lumbar dextroscoliosis. IMPRESSION: 1. No acute or interval finding. 2. Chronic findings are stable from 2019 and described above. Electronically Signed   By: Marnee Spring M.D.   On:  03/26/2021 07:39   DG Chest 1 View  Result Date: 03/26/2021 CLINICAL DATA:  Fall. EXAM: CHEST  1 VIEW COMPARISON:  Chest radiograph dated 01/14/2018 FINDINGS: Evaluation is limited due to patient's positioning and superimposition of the mandible over the right upper lobe. No focal consolidation, pleural effusion pneumothorax. Stable cardiac silhouette. No acute osseous pathology. Osteopenia with degenerative changes of the spine. Multilevel thoracic vertebroplasty. IMPRESSION: No acute cardiopulmonary process. Electronically Signed   By: Elgie Collard M.D.   On: 03/26/2021 02:45   DG Ankle Complete Right  Result Date: 03/26/2021 CLINICAL DATA:  Fall and right lower extremity pain. EXAM: RIGHT KNEE - COMPLETE 4+ VIEW; RIGHT ANKLE - COMPLETE 3+ VIEW COMPARISON:  None. FINDINGS: There is a mildly displaced fracture of the medial malleolus. Minimally displaced fracture of the lateral malleolus. There is a nondisplaced fracture of the posterior malleolus. No other acute fracture. The bones are osteopenic. There is no dislocation. There  is a total right knee arthroplasty. The arthroplasty components appear intact and in anatomic alignment. There is diffuse subcutaneous edema. IMPRESSION: 1. Trimalleolar fracture.  No dislocation. 2. Right knee arthroplasty appears intact. Electronically Signed   By: Elgie Collard M.D.   On: 03/26/2021 02:44   CT HEAD WO CONTRAST ( )  Result Date: 03/26/2021 CLINICAL DATA:  Headache, new or worsening (Age >= 50y); Neck trauma (Age >= 65y) Neck trauma (Age >= 65y) EXAM: CT HEAD WITHOUT CONTRAST CT CERVICAL SPINE WITHOUT CONTRAST TECHNIQUE: Multidetector CT imaging of the head and cervical spine was performed following the standard protocol without intravenous contrast. Multiplanar CT image reconstructions of the cervical spine were also generated. COMPARISON:  January 14, 2018. FINDINGS: CT HEAD FINDINGS Brain: No evidence of acute infarction, hemorrhage, hydrocephalus,  extra-axial collection or mass lesion/mass effect. Mild to moderate for age patchy and confluent white matter hypoattenuation, nonspecific but compatible with chronic microvascular ischemic disease. Mild for age atrophy with ex vacuo ventricular dilation. Partially empty sella. Vascular: Calcific intracranial atherosclerosis. No hyperdense vessel identified. Skull: No acute fracture. Sinuses/Orbits: Clear sinuses.  Unremarkable orbits. Other: No mastoid effusions. CT CERVICAL SPINE FINDINGS Alignment: Similar alignment. Similar mild anterolisthesis of C7 on T1, likely degenerative given stability and facet arthropathy at this level. Skull base and vertebrae: No evidence of acute fracture. Similar vertebral body heights. Osteopenia. Soft tissues and spinal canal: No prevertebral fluid or swelling. No visible canal hematoma. Disc levels: Similar moderate degenerative change at C5-C6 and C6-C7. Similar likely moderate foraminal stenosis at multiple levels due to facet/uncovertebral hypertrophy. Upper chest: Biapical pleuroparenchymal scarring and mild dependent probable atelectasis. Otherwise, lung apices are clear. Other: Calcific atherosclerosis. IMPRESSION: CT Head 1. No evidence of acute intracranial abnormality. 2. Mild-to-moderate chronic microvascular ischemic disease and atrophy. CT Cervical Spine 1. No evidence of acute fracture or traumatic malalignment. 2. Similar moderate degenerative change at C5-C6 and C6-C7 and multilevel facet arthropathy with likely moderate multilevel foraminal stenosis. Electronically Signed   By: Feliberto Harts M.D.   On: 03/26/2021 07:37   CT Cervical Spine Wo Contrast  Result Date: 03/26/2021 CLINICAL DATA:  Headache, new or worsening (Age >= 50y); Neck trauma (Age >= 65y) Neck trauma (Age >= 65y) EXAM: CT HEAD WITHOUT CONTRAST CT CERVICAL SPINE WITHOUT CONTRAST TECHNIQUE: Multidetector CT imaging of the head and cervical spine was performed following the standard protocol  without intravenous contrast. Multiplanar CT image reconstructions of the cervical spine were also generated. COMPARISON:  January 14, 2018. FINDINGS: CT HEAD FINDINGS Brain: No evidence of acute infarction, hemorrhage, hydrocephalus, extra-axial collection or mass lesion/mass effect. Mild to moderate for age patchy and confluent white matter hypoattenuation, nonspecific but compatible with chronic microvascular ischemic disease. Mild for age atrophy with ex vacuo ventricular dilation. Partially empty sella. Vascular: Calcific intracranial atherosclerosis. No hyperdense vessel identified. Skull: No acute fracture. Sinuses/Orbits: Clear sinuses.  Unremarkable orbits. Other: No mastoid effusions. CT CERVICAL SPINE FINDINGS Alignment: Similar alignment. Similar mild anterolisthesis of C7 on T1, likely degenerative given stability and facet arthropathy at this level. Skull base and vertebrae: No evidence of acute fracture. Similar vertebral body heights. Osteopenia. Soft tissues and spinal canal: No prevertebral fluid or swelling. No visible canal hematoma. Disc levels: Similar moderate degenerative change at C5-C6 and C6-C7. Similar likely moderate foraminal stenosis at multiple levels due to facet/uncovertebral hypertrophy. Upper chest: Biapical pleuroparenchymal scarring and mild dependent probable atelectasis. Otherwise, lung apices are clear. Other: Calcific atherosclerosis. IMPRESSION: CT Head 1. No evidence of acute intracranial abnormality.  2. Mild-to-moderate chronic microvascular ischemic disease and atrophy. CT Cervical Spine 1. No evidence of acute fracture or traumatic malalignment. 2. Similar moderate degenerative change at C5-C6 and C6-C7 and multilevel facet arthropathy with likely moderate multilevel foraminal stenosis. Electronically Signed   By: Feliberto Harts M.D.   On: 03/26/2021 07:37   DG Knee Complete 4 Views Right  Result Date: 03/26/2021 CLINICAL DATA:  Fall and right lower extremity pain.  EXAM: RIGHT KNEE - COMPLETE 4+ VIEW; RIGHT ANKLE - COMPLETE 3+ VIEW COMPARISON:  None. FINDINGS: There is a mildly displaced fracture of the medial malleolus. Minimally displaced fracture of the lateral malleolus. There is a nondisplaced fracture of the posterior malleolus. No other acute fracture. The bones are osteopenic. There is no dislocation. There is a total right knee arthroplasty. The arthroplasty components appear intact and in anatomic alignment. There is diffuse subcutaneous edema. IMPRESSION: 1. Trimalleolar fracture.  No dislocation. 2. Right knee arthroplasty appears intact. Electronically Signed   By: Elgie Collard M.D.   On: 03/26/2021 02:44   DG Ankle Right Port  Result Date: 03/26/2021 CLINICAL DATA:  Postreduction EXAM: PORTABLE RIGHT ANKLE - 2 VIEW COMPARISON:  Earlier today FINDINGS: Acute trimalleolar fracture with mild posterior displacement at the distal fibula. No change in alignment when compared to prior. Generalized osteopenia soft tissue swelling. IMPRESSION: Stable ankle fracture alignment after splinting. Electronically Signed   By: Marnee Spring M.D.   On: 03/26/2021 05:46   DG Hip Unilat With Pelvis 2-3 Views Right  Result Date: 03/26/2021 CLINICAL DATA:  Fall and right hip pain. EXAM: DG HIP (WITH OR WITHOUT PELVIS) 2-3V RIGHT COMPARISON:  Right hip radiograph dated 10/16/2020 FINDINGS: There is no acute fracture or dislocation. The bones are osteopenic. Mild bilateral hip arthritic changes. The soft tissues are unremarkable. IMPRESSION: No acute fracture or dislocation. Electronically Signed   By: Elgie Collard M.D.   On: 03/26/2021 02:42      DVT prophylaxis: SCDs  Code Status: Partial code  Family Communication: No family at bedside   Consultants: Orthopedics  Procedures:     Objective    Physical Examination:   General-appears in no acute distress Heart-S1-S2, regular, no murmur auscultated Lungs-clear to auscultation bilaterally, no  wheezing or crackles auscultated Abdomen-soft, nontender, no organomegaly Extremities-right ankle in splint Neuro-alert, oriented x3, no focal deficit noted   Status is: Inpatient  Dispo: The patient is from: Home              Anticipated d/c is to: Skilled nursing facility              Anticipated d/c date is: 03/30/2021              Patient currently not stable for discharge  Barrier to discharge-ORIF for trimalleolar fracture  COVID-19 Labs  No results for input(s): DDIMER, FERRITIN, LDH, CRP in the last 72 hours.  Lab Results  Component Value Date   SARSCOV2NAA NEGATIVE 03/26/2021   SARSCOV2NAA Not Detected 01/22/2019    Microbiology  Recent Results (from the past 240 hour(s))  Resp Panel by RT-PCR (Flu A&B, Covid) Nasopharyngeal Swab     Status: None   Collection Time: 03/26/21  3:34 AM   Specimen: Nasopharyngeal Swab; Nasopharyngeal(NP) swabs in vial transport medium  Result Value Ref Range Status   SARS Coronavirus 2 by RT PCR NEGATIVE NEGATIVE Final    Comment: (NOTE) SARS-CoV-2 target nucleic acids are NOT DETECTED.  The SARS-CoV-2 RNA is generally detectable in upper respiratory specimens  during the acute phase of infection. The lowest concentration of SARS-CoV-2 viral copies this assay can detect is 138 copies/mL. A negative result does not preclude SARS-Cov-2 infection and should not be used as the sole basis for treatment or other patient management decisions. A negative result may occur with  improper specimen collection/handling, submission of specimen other than nasopharyngeal swab, presence of viral mutation(s) within the areas targeted by this assay, and inadequate number of viral copies(<138 copies/mL). A negative result must be combined with clinical observations, patient history, and epidemiological information. The expected result is Negative.  Fact Sheet for Patients:  BloggerCourse.com  Fact Sheet for Healthcare  Providers:  SeriousBroker.it  This test is no t yet approved or cleared by the [place] FDA and  has been authorized for detection and/or diagnosis of SARS-CoV-2 by FDA under an Emergency Use Authorization (EUA). This EUA will remain  in effect (meaning this test can be used) for the duration of the COVID-19 declaration under Section 564(b)(1) of the Act, 21 U.S.C.section 360bbb-3(b)(1), unless the authorization is terminated  or revoked sooner.       Influenza A by PCR NEGATIVE NEGATIVE Final   Influenza B by PCR NEGATIVE NEGATIVE Final    Comment: (NOTE) The Xpert Xpress SARS-CoV-2/FLU/RSV plus assay is intended as an aid in the diagnosis of influenza from Nasopharyngeal swab specimens and should not be used as a sole basis for treatment. Nasal washings and aspirates are unacceptable for Xpert Xpress SARS-CoV-2/FLU/RSV testing.  Fact Sheet for Patients: BloggerCourse.com  Fact Sheet for Healthcare Providers: SeriousBroker.it  This test is not yet approved or cleared by the [place] FDA and has been authorized for detection and/or diagnosis of SARS-CoV-2 by FDA under an Emergency Use Authorization (EUA). This EUA will remain in effect (meaning this test can be used) for the duration of the COVID-19 declaration under Section 564(b)(1) of the Act, 21 U.S.C. section 360bbb-3(b)(1), unless the authorization is terminated or revoked.  Performed at [facility], [address], [place], [place] [address]              Meredeth Ide   [facility] If 7PM-7AM, please contact night-coverage at [email or web address], Office  [phone]   03/27/2021, 2:22 PM  LOS: 1 day

## 2021-03-27 NOTE — Op Note (Signed)
03/26/2021 - 03/27/2021  PATIENT:  [name] [name]    PRE-OPERATIVE DIAGNOSIS: Right trimalleolar ankle fracture  POST-OPERATIVE DIAGNOSIS: Right trimalleolar ankle fracture with complete avulsion of anterior talofibular ligament  PROCEDURE: 1.  Open reduction internal fixation right trimalleolar ankle fracture without fixation of the posterior lip 2.  Primary repair anterior talofibular ligament 3.  3 views of the right ankle plus a stress view taken postoperatively demonstrate anatomic alignment with stable syndesmotic stability.  SURGEON:  Eulas Post, MD  PHYSICIAN ASSISTANT: Janine Ores, PA-C, present and scrubbed throughout the case, critical for completion in a timely fashion, and for retraction, instrumentation, and closure.  ANESTHESIA:   General  ESTIMATED BLOOD LOSS: 75 mL  PREOPERATIVE INDICATIONS:  [name] [name] is a  87 y.o. female with a diagnosis of RIGHT ANKLE FRACTURE who elected for surgical management to minimize the risk for malunion and nonunion and post-traumatic arthritis.  She is a household ambulator with a walker, and had inability to ambulate.  The risks benefits and alternatives were discussed with the patient preoperatively including but not limited to the risks of infection, bleeding, nerve injury, cardiopulmonary complications, the need for revision surgery, the need for hardware removal, among others, and the patient was willing to proceed.  OPERATIVE IMPLANTS: Zimmer anatomic locking fibular plate with proximal nonlocking screws and distal locking screws with a single 4.0 mm cannulated screw for the medial malleolus and a #2 FiberWire in a juggernaut anchor for the anterior talofibular ligament   OPERATIVE PROCEDURE: The patient was brought to the operating room and placed in the supine position. All bony prominences were padded. General anesthesia was administered. The lower extremity was prepped and draped in the usual sterile fashion.  Tourniquet was not  utilized. Time out was performed.   Incision was made over the distal fibula and the fracture was exposed and reduced anatomically with a clamp.  The anterior talofibular ligament was avulsed off of the fibula and the talus was completely exposed.  This was a very unusual fracture pattern.  The fibula itself had a longitudinal fracture line that was fairly posterior, it extended with a split all the way down to the very tip.  I reduced this as anatomically as possible, applied the plate, I selected the anatomic plate to get as much posterior fixation as possible, and then secured the plate proximally and distally.  I then turned my attention to the medial malleolus. Incision was made over the medial malleolus and the fracture exposed and held provisionally with a clamp.  A guidepin was placed for the 4.0 mm cannulated screw and then confirmation of reduction was made with fluoroscopy. I then placed a 76mm screw which had satisfactory fixation.   The syndesmosis was stressed using live fluoroscopy and found to be stable.  I turned my attention to the anterior talofibular ligament.  I drilled and attempt to miss the screws, and at first place an anchor which did not hold because the bone quality was not good enough in the very distal tip where the avulsion was present.  I went slightly more proximal where there was some cortical bone and then got the anchor to secure, passed 2 limbs through the anterior talofibular ligament tissue, and then tied these together which restored some degree of soft tissue integrity to the anterior fibula.  The wounds were irrigated, and closed with vicryl with routine closure for the skin. The wounds were injected with local anesthetic. Sterile gauze was applied followed by a  posterior splint. She was awakened and returned to the PACU in stable and satisfactory condition. There were no complications.

## 2021-03-27 NOTE — Anesthesia Preprocedure Evaluation (Addendum)
Anesthesia Evaluation  Patient identified by MRN, date of birth, ID band Patient awake    Reviewed: Allergy & Precautions, NPO status , Patient's Chart, lab work & pertinent test results  History of Anesthesia Complications Negative for: history of anesthetic complications  Airway Mallampati: II  TM Distance: >3 FB Neck ROM: Full    Dental  (+) Dental Advisory Given, Teeth Intact   Pulmonary neg pulmonary ROS, former smoker,    Pulmonary exam normal        Cardiovascular hypertension, + Peripheral Vascular Disease  Normal cardiovascular exam     Neuro/Psych Anxiety Depression negative neurological ROS     GI/Hepatic Neg liver ROS, hiatal hernia, GERD  ,  Endo/Other  Hypothyroidism   Renal/GU CRFRenal disease  negative genitourinary   Musculoskeletal  (+) Arthritis ,   Abdominal   Peds  Hematology  (+) anemia , Thrombocytopenia- plts 95   Anesthesia Other Findings   Reproductive/Obstetrics                           Anesthesia Physical Anesthesia Plan  ASA: 3 and emergent  Anesthesia Plan: General   Post-op Pain Management: GA combined w/ Regional for post-op pain   Induction: Intravenous  PONV Risk Score and Plan: 3 and Ondansetron, Dexamethasone, Midazolam and Treatment may vary due to age or medical condition  Airway Management Planned: LMA  Additional Equipment: None  Intra-op Plan:   Post-operative Plan: Extubation in OR  Informed Consent: I have reviewed the patients History and Physical, chart, labs and discussed the procedure including the risks, benefits and alternatives for the proposed anesthesia with the patient or authorized representative who has indicated his/her understanding and acceptance.     Dental advisory given  Plan Discussed with:   Anesthesia Plan Comments:         Anesthesia Quick Evaluation

## 2021-03-27 NOTE — Anesthesia Procedure Notes (Signed)
Anesthesia Regional Block: Adductor canal block   Pre-Anesthetic Checklist: , timeout performed,  Correct Patient, Correct Site, Correct Laterality,  Correct Procedure, Correct Position, site marked,  Risks and benefits discussed,  Surgical consent,  Pre-op evaluation,  At surgeon's request and post-op pain management  Laterality: Right  Prep: chloraprep       Needles:  Injection technique: Single-shot  Needle Type: Echogenic Stimulator Needle     Needle Length: 10cm  Needle Gauge: 20     Additional Needles:   Procedures:,,,, ultrasound used (permanent image in chart),,    Narrative:  Start time: 03/27/2021 1:50 PM End time: 03/27/2021 1:55 PM Injection made incrementally with aspirations every 5 mL.  Performed by: Personally  Anesthesiologist: Lucretia Kern, MD  Additional Notes: Standard monitors applied. Skin prepped. Good needle visualization with ultrasound. Injection made in 5cc increments with no resistance to injection. Patient tolerated the procedure well.

## 2021-03-27 NOTE — Progress Notes (Signed)
Pt taken to OR for ORIF of Right ankle.

## 2021-03-27 NOTE — Progress Notes (Signed)
Pt returned from OR to room 1510. Pt has no complaints of pain at this time.

## 2021-03-27 NOTE — Anesthesia Procedure Notes (Signed)
Anesthesia Regional Block: Popliteal block   Pre-Anesthetic Checklist: , timeout performed,  Correct Patient, Correct Site, Correct Laterality,  Correct Procedure, Correct Position, site marked,  Risks and benefits discussed,  Surgical consent,  Pre-op evaluation,  At surgeon's request and post-op pain management  Laterality: Right  Prep: chloraprep       Needles:  Injection technique: Single-shot  Needle Type: Echogenic Stimulator Needle     Needle Length: 10cm  Needle Gauge: 20     Additional Needles:   Procedures:,,,, ultrasound used (permanent image in chart),,    Narrative:  Start time: 03/27/2021 1:55 PM End time: 03/27/2021 1:58 PM  Performed by: Personally  Anesthesiologist: Lucretia Kern, MD  Additional Notes: Standard monitors applied. Skin prepped. Good needle visualization with ultrasound. Injection made in 5cc increments with no resistance to injection. Patient tolerated the procedure well.

## 2021-03-27 NOTE — Consult Note (Signed)
Reason for Consult:Right ankle fx Referring Physician: Eleonore Chiquito Time called: 0730 Time at bedside: [name] is an 87 y.o. female.  HPI: [name] [name] was in [name] [name] apartment in ALF at [facility] and tripped over one of [name] [name] assistive devices and fell. [name] [name] had immediate right ankle pain and could not get up or bear weight. [name] [name] was brought to the ED where x-rays showed an ankle fx and orthopedic surgery was consulted. [name] [name] normally ambulates with a RW.  Past Medical History:  Diagnosis Date   Anemia    Anxiety    Chronotropic incompetence 12/2012    potentially medication related; noted on CPET    DDD (degenerative disc disease)    Eczema    GERD (gastroesophageal reflux disease)    H/O hiatal hernia    Hx: UTI (urinary tract infection)    Hyperlipidemia    Hypertension    Hypothyroidism    Osteopenia    Septicemia ([place]) 2002   following UTI   Vertigo, benign positional     Past Surgical History:  Procedure Laterality Date   BREAST SURGERY     left biopsy   CATARACT EXTRACTION Right    2 weeks ago   CPET / MET - PFTS     Consistent with chronotropic incompetence; See attached report in the results section   DILATION AND CURETTAGE OF UTERUS     x 2   DOPPLER ECHOCARDIOGRAPHY  01/10/2013   Normal LV size and function. Normal EF. Air sclerosis but no stenosis   ESOPHAGOGASTRODUODENOSCOPY  05/04/2012   Procedure: ESOPHAGOGASTRODUODENOSCOPY (EGD);  Surgeon: Inda Castle, MD;  Location: [facility];  Service: Endoscopy;  Laterality: N/A;   EYE SURGERY     cataract extraction with ILO  ? eye   IR KYPHO EA ADDL LEVEL THORACIC OR LUMBAR  10/28/2016   IR KYPHO LUMBAR INC FX REDUCE BONE BX UNI/BIL CANNULATION INC/IMAGING  10/28/2016   IR KYPHO THORACIC WITH BONE BIOPSY  12/24/2016   IR RADIOLOGIST EVAL & MGMT  11/11/2016   KNEE ARTHROSCOPY  2011   right   TONSILLECTOMY     TOTAL KNEE ARTHROPLASTY  04/24/2012   Procedure: TOTAL KNEE ARTHROPLASTY;  Surgeon: Gearlean Alf,  MD;  Location: [facility];  Service: Orthopedics;  Laterality: Right;   TRANSTHORACIC ECHOCARDIOGRAM  02/2018   Ordered by PCP for "congestive heart failure " -EF 60 M 65%.  GR 1 DD.  No R WMA--- > ESSENTIALLY NORMAL    Family History  Problem Relation Age of Onset   Lung cancer Father 78       Died at age 21   Stroke Mother 70       Died in [name] [name] late 81s.   Heart attack Mother 30   Diabetes Mother    Arthritis Mother     Social History:  reports that [name] [name] quit smoking about 55 years ago. [name] [name] smoking use included cigarettes. [name] [name] has never used smokeless tobacco. [name] [name] reports that [name] [name] does not drink alcohol and does not use drugs.  Allergies: No Known Allergies  Medications: I have reviewed the patient's current medications.  Results for orders placed or performed during the hospital encounter of 03/26/21 (from the past 48 hour(s))  Basic metabolic panel     Status: Abnormal   Collection Time: 03/26/21  3:34 AM  Result Value Ref Range   Sodium 136 135 - 145 mmol/L   Potassium 4.3 3.5 - 5.1 mmol/L   Chloride 104  98 - 111 mmol/L   CO2 23 22 - 32 mmol/L   Glucose, Bld 110 (H) 70 - 99 mg/dL    Comment: Glucose reference range applies only to samples taken after fasting for at least 8 hours.   BUN 32 (H) 8 - 23 mg/dL   Creatinine, Ser 1.36 (H) 0.44 - 1.00 mg/dL   Calcium 9.8 8.9 - 10.3 mg/dL   GFR, Estimated 37 (L) >60 mL/min    Comment: (NOTE) Calculated using the CKD-EPI Creatinine Equation (2021)    Anion gap 9 5 - 15    Comment: Performed at [facility], [place] [address], [place], [place] [address]  CBC WITH DIFFERENTIAL     Status: Abnormal   Collection Time: 03/26/21  3:34 AM  Result Value Ref Range   WBC 11.0 (H) 4.0 - 10.5 K/uL    Comment: WHITE COUNT CONFIRMED ON SMEAR   RBC 3.68 (L) 3.87 - 5.11 MIL/uL   Hemoglobin 10.0 (L) 12.0 - 15.0 g/dL   HCT 33.7 (L) 36.0 - 46.0 %   MCV 91.6 80.0 - 100.0 fL   MCH 27.2 26.0 - 34.0 pg   MCHC 29.7 (L) 30.0 - 36.0  g/dL   RDW 17.8 (H) 11.5 - 15.5 %   Platelets 46 (L) 150 - 400 K/uL    Comment: SPECIMEN CHECKED FOR CLOTS CONSISTENT WITH PREVIOUS RESULT REPEATED TO VERIFY PLATELET COUNT CONFIRMED BY SMEAR    nRBC 0.0 0.0 - 0.2 %   Neutrophils Relative % 77 %   Neutro Abs 8.6 (H) 1.7 - 7.7 K/uL   Lymphocytes Relative 11 %   Lymphs Abs 1.2 0.7 - 4.0 K/uL   Monocytes Relative 9 %   Monocytes Absolute 1.0 0.1 - 1.0 K/uL   Eosinophils Relative 0 %   Eosinophils Absolute 0.0 0.0 - 0.5 K/uL   Basophils Relative 1 %   Basophils Absolute 0.1 0.0 - 0.1 K/uL   Immature Granulocytes 2 %   Abs Immature Granulocytes 0.23 (H) 0.00 - 0.07 K/uL    Comment: Performed at [facility], [place] [address] [place], [place] [address]  Resp Panel by RT-PCR (Flu A&B, Covid) Nasopharyngeal Swab     Status: None   Collection Time: 03/26/21  3:34 AM   Specimen: Nasopharyngeal Swab; Nasopharyngeal(NP) swabs in vial transport medium  Result Value Ref Range   SARS Coronavirus 2 by RT PCR NEGATIVE NEGATIVE    Comment: (NOTE) SARS-CoV-2 target nucleic acids are NOT DETECTED.  The SARS-CoV-2 RNA is generally detectable in upper respiratory specimens during the acute phase of infection. The lowest concentration of SARS-CoV-2 viral copies this assay can detect is 138 copies/mL. A negative result does not preclude SARS-Cov-2 infection and should not be used as the sole basis for treatment or other patient management decisions. A negative result may occur with  improper specimen collection/handling, submission of specimen other than nasopharyngeal swab, presence of viral mutation(s) within the areas targeted by this assay, and inadequate number of viral copies(<138 copies/mL). A negative result must be combined with clinical observations, patient history, and epidemiological information. The expected result is Negative.  Fact Sheet for Patients:  EntrepreneurPulse.com.au  Fact Sheet for  Healthcare Providers:  IncredibleEmployment.be  This test is no t yet approved or cleared by the [place] FDA and  has been authorized for detection and/or diagnosis of SARS-CoV-2 by FDA under an Emergency Use Authorization (EUA). This EUA will remain  in effect (meaning this test can be used)  for the duration of the COVID-19 declaration under Section 564(b)(1) of the Act, 21 U.S.C.section 360bbb-3(b)(1), unless the authorization is terminated  or revoked sooner.       Influenza A by PCR NEGATIVE NEGATIVE   Influenza B by PCR NEGATIVE NEGATIVE    Comment: (NOTE) The Xpert Xpress SARS-CoV-2/FLU/RSV plus assay is intended as an aid in the diagnosis of influenza from Nasopharyngeal swab specimens and should not be used as a sole basis for treatment. Nasal washings and aspirates are unacceptable for Xpert Xpress SARS-CoV-2/FLU/RSV testing.  Fact Sheet for Patients: EntrepreneurPulse.com.au  Fact Sheet for Healthcare Providers: IncredibleEmployment.be  This test is not yet approved or cleared by the [place] FDA and has been authorized for detection and/or diagnosis of SARS-CoV-2 by FDA under an Emergency Use Authorization (EUA). This EUA will remain in effect (meaning this test can be used) for the duration of the COVID-19 declaration under Section 564(b)(1) of the Act, 21 U.S.C. section 360bbb-3(b)(1), unless the authorization is terminated or revoked.  Performed at [facility], [place] [address], [place], [place] [address]   Type and screen [place]     Status: None   Collection Time: 03/26/21  4:30 AM  Result Value Ref Range   ABO/RH(D) O POS    Antibody Screen NEG    Sample Expiration      03/29/2021,2359 Performed at [facility], [place] [address] [place], [place] [address]   Protime-INR     Status: None   Collection Time: 03/26/21  4:30 AM  Result  Value Ref Range   Prothrombin Time 13.0 11.4 - 15.2 seconds   INR 1.0 0.8 - 1.2    Comment: (NOTE) INR goal varies based on device and disease states. Performed at [facility], [place] [address], [place], [place] [address]   Comprehensive metabolic panel     Status: Abnormal   Collection Time: 03/27/21  5:43 AM  Result Value Ref Range   Sodium 141 135 - 145 mmol/L   Potassium 3.6 3.5 - 5.1 mmol/L   Chloride 104 98 - 111 mmol/L   CO2 27 22 - 32 mmol/L   Glucose, Bld 112 (H) 70 - 99 mg/dL    Comment: Glucose reference range applies only to samples taken after fasting for at least 8 hours.   BUN 31 (H) 8 - 23 mg/dL   Creatinine, Ser 1.29 (H) 0.44 - 1.00 mg/dL   Calcium 9.6 8.9 - 10.3 mg/dL   Total Protein 7.4 6.5 - 8.1 g/dL   Albumin 3.7 3.5 - 5.0 g/dL   AST 21 15 - 41 U/L   ALT 16 0 - 44 U/L   Alkaline Phosphatase 69 38 - 126 U/L   Total Bilirubin 0.4 0.3 - 1.2 mg/dL   GFR, Estimated 40 (L) >60 mL/min    Comment: (NOTE) Calculated using the CKD-EPI Creatinine Equation (2021)    Anion gap 10 5 - 15    Comment: Performed at [facility], [place] [address], [place], [place] [address]  CBC     Status: Abnormal   Collection Time: 03/27/21  5:43 AM  Result Value Ref Range   WBC 9.4 4.0 - 10.5 K/uL   RBC 3.15 (L) 3.87 - 5.11 MIL/uL   Hemoglobin 8.5 (L) 12.0 - 15.0 g/dL   HCT 27.5 (L) 36.0 - 46.0 %   MCV 87.3 80.0 - 100.0 fL   MCH 27.0 26.0 - 34.0 pg   MCHC  30.9 30.0 - 36.0 g/dL   RDW 17.6 (H) 11.5 - 15.5 %   Platelets 95 (L) 150 - 400 K/uL    Comment: Immature Platelet Fraction may be clinically indicated, consider ordering this additional test [id number]    nRBC 0.0 0.0 - 0.2 %    Comment: Performed at [facility], [place] [address] [place], [place] [address]    CT ABDOMEN PELVIS WO CONTRAST  Result Date: 03/26/2021 CLINICAL DATA:  Abdominal trauma.  Back pain after fall last night EXAM: CT ABDOMEN AND PELVIS WITHOUT  CONTRAST TECHNIQUE: Multidetector CT imaging of the abdomen and pelvis was performed following the standard protocol without IV contrast. COMPARISON:  01/14/2018 FINDINGS: Lower chest: Atheromatous calcification of the aorta and coronaries. Small sliding hiatal hernia. Hepatobiliary: No focal liver abnormality.No evidence of biliary obstruction or stone. Pancreas: 2 cm cystic density projecting posteriorly from the pancreas, non progressed and non worrisome given patient age. No visible injury. Spleen: Unremarkable. Adrenals/Urinary Tract: Negative adrenals. No hydronephrosis or stone. Unremarkable bladder with moderate distension. Stomach/Bowel:  No obstruction. No visible injury or inflammation. Vascular/Lymphatic: Diffuse atheromatous calcification of the aorta, iliacs, and branch vessels. No acute vascular finding. No mass or adenopathy. Reproductive:No pathologic findings. Other: No ascites or pneumoperitoneum. Musculoskeletal: No acute finding. Prior T9, T11, L1, and L2 compression fractures with cement augmentation. Generalized spinal degeneration with lumbar dextroscoliosis. IMPRESSION: 1. No acute or interval finding. 2. Chronic findings are stable from 2019 and described above. Electronically Signed   By: Monte Fantasia M.D.   On: 03/26/2021 07:39   DG Chest 1 View  Result Date: 03/26/2021 CLINICAL DATA:  Fall. EXAM: CHEST  1 VIEW COMPARISON:  Chest radiograph dated 01/14/2018 FINDINGS: Evaluation is limited due to patient's positioning and superimposition of the mandible over the right upper lobe. No focal consolidation, pleural effusion pneumothorax. Stable cardiac silhouette. No acute osseous pathology. Osteopenia with degenerative changes of the spine. Multilevel thoracic vertebroplasty. IMPRESSION: No acute cardiopulmonary process. Electronically Signed   By: Anner Crete M.D.   On: 03/26/2021 02:45   DG Ankle Complete Right  Result Date: 03/26/2021 CLINICAL DATA:  Fall and right lower  extremity pain. EXAM: RIGHT KNEE - COMPLETE 4+ VIEW; RIGHT ANKLE - COMPLETE 3+ VIEW COMPARISON:  None. FINDINGS: There is a mildly displaced fracture of the medial malleolus. Minimally displaced fracture of the lateral malleolus. There is a nondisplaced fracture of the posterior malleolus. No other acute fracture. The bones are osteopenic. There is no dislocation. There is a total right knee arthroplasty. The arthroplasty components appear intact and in anatomic alignment. There is diffuse subcutaneous edema. IMPRESSION: 1. Trimalleolar fracture.  No dislocation. 2. Right knee arthroplasty appears intact. Electronically Signed   By: Anner Crete M.D.   On: 03/26/2021 02:44   CT HEAD WO CONTRAST (5MM)  Result Date: 03/26/2021 CLINICAL DATA:  Headache, new or worsening (Age >= 50y); Neck trauma (Age >= 65y) Neck trauma (Age >= 65y) EXAM: CT HEAD WITHOUT CONTRAST CT CERVICAL SPINE WITHOUT CONTRAST TECHNIQUE: Multidetector CT imaging of the head and cervical spine was performed following the standard protocol without intravenous contrast. Multiplanar CT image reconstructions of the cervical spine were also generated. COMPARISON:  January 14, 2018. FINDINGS: CT HEAD FINDINGS Brain: No evidence of acute infarction, hemorrhage, hydrocephalus, extra-axial collection or mass lesion/mass effect. Mild to moderate for age patchy and confluent white matter hypoattenuation, nonspecific but compatible with chronic microvascular ischemic disease. Mild for age atrophy with ex vacuo ventricular dilation. Partially empty  sella. Vascular: Calcific intracranial atherosclerosis. No hyperdense vessel identified. Skull: No acute fracture. Sinuses/Orbits: Clear sinuses.  Unremarkable orbits. Other: No mastoid effusions. CT CERVICAL SPINE FINDINGS Alignment: Similar alignment. Similar mild anterolisthesis of C7 on T1, likely degenerative given stability and facet arthropathy at this level. Skull base and vertebrae: No evidence of acute  fracture. Similar vertebral body heights. Osteopenia. Soft tissues and spinal canal: No prevertebral fluid or swelling. No visible canal hematoma. Disc levels: Similar moderate degenerative change at C5-C6 and C6-C7. Similar likely moderate foraminal stenosis at multiple levels due to facet/uncovertebral hypertrophy. Upper chest: Biapical pleuroparenchymal scarring and mild dependent probable atelectasis. Otherwise, lung apices are clear. Other: Calcific atherosclerosis. IMPRESSION: CT Head 1. No evidence of acute intracranial abnormality. 2. Mild-to-moderate chronic microvascular ischemic disease and atrophy. CT Cervical Spine 1. No evidence of acute fracture or traumatic malalignment. 2. Similar moderate degenerative change at C5-C6 and C6-C7 and multilevel facet arthropathy with likely moderate multilevel foraminal stenosis. Electronically Signed   By: Margaretha Sheffield M.D.   On: 03/26/2021 07:37   CT Cervical Spine Wo Contrast  Result Date: 03/26/2021 CLINICAL DATA:  Headache, new or worsening (Age >= 50y); Neck trauma (Age >= 65y) Neck trauma (Age >= 65y) EXAM: CT HEAD WITHOUT CONTRAST CT CERVICAL SPINE WITHOUT CONTRAST TECHNIQUE: Multidetector CT imaging of the head and cervical spine was performed following the standard protocol without intravenous contrast. Multiplanar CT image reconstructions of the cervical spine were also generated. COMPARISON:  January 14, 2018. FINDINGS: CT HEAD FINDINGS Brain: No evidence of acute infarction, hemorrhage, hydrocephalus, extra-axial collection or mass lesion/mass effect. Mild to moderate for age patchy and confluent white matter hypoattenuation, nonspecific but compatible with chronic microvascular ischemic disease. Mild for age atrophy with ex vacuo ventricular dilation. Partially empty sella. Vascular: Calcific intracranial atherosclerosis. No hyperdense vessel identified. Skull: No acute fracture. Sinuses/Orbits: Clear sinuses.  Unremarkable orbits. Other: No mastoid  effusions. CT CERVICAL SPINE FINDINGS Alignment: Similar alignment. Similar mild anterolisthesis of C7 on T1, likely degenerative given stability and facet arthropathy at this level. Skull base and vertebrae: No evidence of acute fracture. Similar vertebral body heights. Osteopenia. Soft tissues and spinal canal: No prevertebral fluid or swelling. No visible canal hematoma. Disc levels: Similar moderate degenerative change at C5-C6 and C6-C7. Similar likely moderate foraminal stenosis at multiple levels due to facet/uncovertebral hypertrophy. Upper chest: Biapical pleuroparenchymal scarring and mild dependent probable atelectasis. Otherwise, lung apices are clear. Other: Calcific atherosclerosis. IMPRESSION: CT Head 1. No evidence of acute intracranial abnormality. 2. Mild-to-moderate chronic microvascular ischemic disease and atrophy. CT Cervical Spine 1. No evidence of acute fracture or traumatic malalignment. 2. Similar moderate degenerative change at C5-C6 and C6-C7 and multilevel facet arthropathy with likely moderate multilevel foraminal stenosis. Electronically Signed   By: Margaretha Sheffield M.D.   On: 03/26/2021 07:37   DG Knee Complete 4 Views Right  Result Date: 03/26/2021 CLINICAL DATA:  Fall and right lower extremity pain. EXAM: RIGHT KNEE - COMPLETE 4+ VIEW; RIGHT ANKLE - COMPLETE 3+ VIEW COMPARISON:  None. FINDINGS: There is a mildly displaced fracture of the medial malleolus. Minimally displaced fracture of the lateral malleolus. There is a nondisplaced fracture of the posterior malleolus. No other acute fracture. The bones are osteopenic. There is no dislocation. There is a total right knee arthroplasty. The arthroplasty components appear intact and in anatomic alignment. There is diffuse subcutaneous edema. IMPRESSION: 1. Trimalleolar fracture.  No dislocation. 2. Right knee arthroplasty appears intact. Electronically Signed   By: Anner Crete  M.D.   On: 03/26/2021 02:44   DG Ankle Right  Port  Result Date: 03/26/2021 CLINICAL DATA:  Postreduction EXAM: PORTABLE RIGHT ANKLE - 2 VIEW COMPARISON:  Earlier today FINDINGS: Acute trimalleolar fracture with mild posterior displacement at the distal fibula. No change in alignment when compared to prior. Generalized osteopenia soft tissue swelling. IMPRESSION: Stable ankle fracture alignment after splinting. Electronically Signed   By: Monte Fantasia M.D.   On: 03/26/2021 05:46   DG Hip Unilat With Pelvis 2-3 Views Right  Result Date: 03/26/2021 CLINICAL DATA:  Fall and right hip pain. EXAM: DG HIP (WITH OR WITHOUT PELVIS) 2-3V RIGHT COMPARISON:  Right hip radiograph dated 10/16/2020 FINDINGS: There is no acute fracture or dislocation. The bones are osteopenic. Mild bilateral hip arthritic changes. The soft tissues are unremarkable. IMPRESSION: No acute fracture or dislocation. Electronically Signed   By: Anner Crete M.D.   On: 03/26/2021 02:42    Review of Systems  HENT:  Negative for ear discharge, ear pain, hearing loss and tinnitus.   Eyes:  Negative for photophobia and pain.  Respiratory:  Negative for cough and shortness of breath.   Cardiovascular:  Negative for chest pain.  Gastrointestinal:  Negative for abdominal pain, nausea and vomiting.  Genitourinary:  Negative for dysuria, flank pain, frequency and urgency.  Musculoskeletal:  Positive for arthralgias (Right ankle) and back pain. Negative for myalgias and neck pain.  Neurological:  Negative for dizziness and headaches.  Hematological:  Does not bruise/bleed easily.  Psychiatric/Behavioral:  The patient is not nervous/anxious.   Blood pressure (!) 131/54, pulse 77, temperature 98.1 F (36.7 C), temperature source Oral, resp. rate 20, height _0  (1.626 m), weight 82.9 kg, SpO2 98 %. Physical Exam Constitutional:      General: [name] [name] is not in acute distress.    Appearance: [name] [name] is well-developed. [name] [name] is not diaphoretic.  HENT:     Head: Normocephalic and atraumatic.   Eyes:     General: No scleral icterus.       Right eye: No discharge.        Left eye: No discharge.     Conjunctiva/sclera: Conjunctivae normal.  Cardiovascular:     Rate and Rhythm: Normal rate and regular rhythm.  Pulmonary:     Effort: Pulmonary effort is normal. No respiratory distress.  Musculoskeletal:     Cervical back: Normal range of motion.     Comments: RLE No traumatic wounds, ecchymosis, or rash  Short leg splint in place  No knee effusion  Knee stable to varus/ valgus and anterior/posterior stress  Sens DPN, SPN, TN intact  Motor EHL 5/5  Toes perfused, No significant edema  Skin:    General: Skin is warm and dry.  Neurological:     Mental Status: [name] [name] is alert.  Psychiatric:        Mood and Affect: Mood normal.        Behavior: Behavior normal.    Assessment/Plan: Right ankle fx -- Plan ORIF today by Dr. Mardelle Matte. Please keep NPO. Multiple medical problems including anxiety, HLD, HTN, and hypothyroidism -- per primary service    Lisette Abu, PA-C Orthopedic Surgery [phone] 03/27/2021, 11:38 AM

## 2021-03-27 NOTE — Transfer of Care (Signed)
Immediate Anesthesia Transfer of Care Note  Patient: [name] [name]  Procedure(s) Performed: OPEN REDUCTION INTERNAL FIXATION (ORIF) ANKLE FRACTURE (Right: Ankle)  Patient Location: PACU  Anesthesia Type:General and Regional  Level of Consciousness: drowsy  Airway & Oxygen Therapy: Patient Spontanous Breathing and Patient connected to face mask  Post-op Assessment: Report given to RN and Post -op Vital signs reviewed and stable  Post vital signs: Reviewed and stable  Last Vitals:  Vitals Value Taken Time  BP 165/51 03/27/21 1658  Temp    Pulse 60 03/27/21 1700  Resp 17 03/27/21 1700  SpO2 100 % 03/27/21 1700  Vitals shown include unvalidated device data.  Last Pain:  Vitals:   03/27/21 1246  TempSrc: Oral  PainSc:       Patients Stated Pain Goal: 0 (03/27/21 1241)  Complications: No notable events documented.

## 2021-03-27 NOTE — H&P (Signed)
PREOPERATIVE H&P  Chief Complaint: right ankle pain  HPI: [name] [name] is a 87 y.o. female who presents for preoperative history and physical with a diagnosis of right ankle pain. 2 days ago she tripped over one of her assistive devices and fell. She had immediate right ankle pain and could not get up or bear weight. She was brought to the ED where x-rays showed an ankle fx and orthopedic surgery was consulted. She normally ambulates with a rolling walker.   This is significantly impairing activities of daily living.  She has elected for surgical management.   Past Medical History:  Diagnosis Date   Anemia    Anxiety    Chronotropic incompetence 12/2012    potentially medication related; noted on CPET    DDD (degenerative disc disease)    Eczema    GERD (gastroesophageal reflux disease)    H/O hiatal hernia    Hx: UTI (urinary tract infection)    Hyperlipidemia    Hypertension    Hypothyroidism    Osteopenia    Septicemia ([place]) 2002   following UTI   Vertigo, benign positional    Past Surgical History:  Procedure Laterality Date   BREAST SURGERY     left biopsy   CATARACT EXTRACTION Right    2 weeks ago   CPET / MET - PFTS     Consistent with chronotropic incompetence; See attached report in the results section   DILATION AND CURETTAGE OF UTERUS     x 2   DOPPLER ECHOCARDIOGRAPHY  01/10/2013   Normal LV size and function. Normal EF. Air sclerosis but no stenosis   ESOPHAGOGASTRODUODENOSCOPY  05/04/2012   Procedure: ESOPHAGOGASTRODUODENOSCOPY (EGD);  Surgeon: Inda Castle, MD;  Location: [facility];  Service: Endoscopy;  Laterality: N/A;   EYE SURGERY     cataract extraction with ILO  ? eye   IR KYPHO EA ADDL LEVEL THORACIC OR LUMBAR  10/28/2016   IR KYPHO LUMBAR INC FX REDUCE BONE BX UNI/BIL CANNULATION INC/IMAGING  10/28/2016   IR KYPHO THORACIC WITH BONE BIOPSY  12/24/2016   IR RADIOLOGIST EVAL & MGMT  11/11/2016   KNEE ARTHROSCOPY  2011   right   TONSILLECTOMY      TOTAL KNEE ARTHROPLASTY  04/24/2012   Procedure: TOTAL KNEE ARTHROPLASTY;  Surgeon: Gearlean Alf, MD;  Location: [facility];  Service: Orthopedics;  Laterality: Right;   TRANSTHORACIC ECHOCARDIOGRAM  02/2018   Ordered by PCP for "congestive heart failure " -EF 60 M 65%.  GR 1 DD.  No R WMA--- > ESSENTIALLY NORMAL   Social History   Socioeconomic History   Marital status: Widowed    Spouse name: [name]   Number of children: 3   Years of education: Not on file   Highest education level: Not on file  Occupational History   Occupation: homemaker  Tobacco Use   Smoking status: Former    Years: 0.00    Types: Cigarettes    Quit date: 05/03/1965    Years since quitting: 55.9   Smokeless tobacco: Never  Vaping Use   Vaping Use: Never used  Substance and Sexual Activity   Alcohol use: No    Comment: occ glass wine   Drug use: No   Sexual activity: Not on file  Other Topics Concern   Not on file  Social History Narrative   She is a widowed mother of 3.   She is a former smoker, with a distant history having quit in 1966.  She does drink an occasional glass of red wine.   She is just now started to try doing exercise with water aerobics.   Social Determinants of Health   Financial Resource Strain: Not on file  Food Insecurity: Not on file  Transportation Needs: Not on file  Physical Activity: Not on file  Stress: Not on file  Social Connections: Not on file   Family History  Problem Relation Age of Onset   Lung cancer Father 34       Died at age 95   Stroke Mother 43       Died in her late 35s.   Heart attack Mother 3   Diabetes Mother    Arthritis Mother    No Known Allergies Prior to Admission medications   Medication Sig Start Date End Date Taking? Authorizing Provider  acetaminophen (TYLENOL) 500 MG tablet Take 1,000 mg by mouth 3 (three) times daily as needed for mild pain.   Yes [provider]  ALPRAZolam (XANAX) 0.25 MG tablet Take 1 tablet (0.25 mg  total) by mouth at bedtime. Patient taking differently: Take 0.25 mg by mouth at bedtime as needed for sleep or anxiety. 04/01/20  Yes Virgie Dad, MD  atorvastatin (LIPITOR) 10 MG tablet Take 5 mg by mouth daily.    Yes [provider]  buPROPion (WELLBUTRIN SR) 150 MG 12 hr tablet Take 1 tablet (150 mg total) by mouth 2 (two) times daily after a meal. 04/10/18  Yes Blanchie Serve, MD  calcium citrate-vitamin D (CITRACAL+D) 315-200 MG-UNIT tablet Take 1 tablet by mouth 2 (two) times daily.   Yes [provider]  Camphor-Menthol-Methyl Sal 3.07-31-08 % PTCH Place 1 patch onto the skin daily. Apply one to lower back daily for back pain   Yes [provider]  Carboxymethylcellulose Sodium (ARTIFICIAL TEARS OP) Apply 1 drop to eye 3 (three) times daily as needed (dry eyes).   Yes [provider]  docusate sodium (COLACE) 100 MG capsule Take 100 mg by mouth 2 (two) times daily.   Yes [provider]  ferrous sulfate 325 (65 FE) MG EC tablet Take 325 mg by mouth every Monday, Wednesday, and Friday. With a meal   Yes [provider]  fish oil-omega-3 fatty acids 1000 MG capsule Take 1 g by mouth daily.   Yes [provider]  gabapentin (NEURONTIN) 100 MG capsule Take 100 mg by mouth 3 (three) times daily.   Yes [provider]  hydrocortisone cream 1 % Apply 1 application topically daily as needed for itching.   Yes [provider]  KLOR-CON M20 20 MEQ tablet TAKE 1 TABLET BY MOUTH TWICE A DAY Patient taking differently: Take 20 mEq by mouth 2 (two) times daily. 11/17/18  Yes Virgie Dad, MD  levothyroxine (SYNTHROID) 75 MCG tablet Take 75 mcg by mouth daily. 03/06/21  Yes [provider]  losartan (COZAAR) 25 MG tablet Take 25 mg by mouth daily. 02/16/21  Yes [provider]  nitrofurantoin (MACRODANTIN) 50 MG capsule Take 50 mg by mouth daily.   Yes [provider]  nystatin (MYCOSTATIN/NYSTOP)  powder Apply 1 application topically 2 (two) times daily as needed (yeast).   Yes [provider]  nystatin cream (MYCOSTATIN) Apply 1 application topically as needed for dry skin.   Yes [provider]  omeprazole (PRILOSEC) 40 MG capsule Take 40 mg by mouth daily.   Yes [provider]  polyethylene glycol (MIRALAX / GLYCOLAX) 17 g  packet Take 17 g by mouth as directed. On Monday and Thursday   Yes [provider]  sertraline (ZOLOFT) 25 MG tablet Take 75 mg by mouth daily.   Yes [provider]  torsemide (DEMADEX) 20 MG tablet Take 20 mg by mouth daily.   Yes [provider]  traMADol (ULTRAM) 50 MG tablet Take 75 mg by mouth every 6 (six) hours as needed for moderate pain.   Yes [provider]     Positive ROS: All other systems have been reviewed and were otherwise negative with the exception of those mentioned in the HPI and as above.  Physical Exam: General: Alert, no acute distress Cardiovascular: No pedal edema Respiratory: No cyanosis, no use of accessory musculature GI: No organomegaly, abdomen is soft and non-tender Skin: No lesions in the area of chief complaint Neurologic: Sensation intact distally Psychiatric: Patient is competent for consent with normal mood and affect Lymphatic: No axillary or cervical lymphadenopathy  MUSCULOSKELETAL: Able to flex and extend toes. Distal sensation intact. RLE splint intact.  Imaging: 3 views right ankle shows trimalleolar ankle fracture  Assessment: Right trimalleolar ankle fracture   Plan: Plan for Procedure(s): OPEN REDUCTION INTERNAL FIXATION (ORIF) ANKLE FRACTURE  The risks benefits and alternatives were discussed with the patient including but not limited to the risks of nonoperative treatment, versus surgical intervention including infection, bleeding, nerve injury,  blood clots, cardiopulmonary complications, morbidity, mortality, among others, and they were  willing to proceed.     Ventura Bruns, PA-C    03/27/2021 1:59 PM

## 2021-03-27 NOTE — Anesthesia Procedure Notes (Signed)
Procedure Name: LMA Insertion Date/Time: 03/27/2021 3:10 PM Performed by: Sudie Grumbling, CRNA Pre-anesthesia Checklist: Patient identified, Emergency Drugs available, Suction available and Patient being monitored Patient Re-evaluated:Patient Re-evaluated prior to induction Oxygen Delivery Method: Circle system utilized Preoxygenation: Pre-oxygenation with 100% oxygen Induction Type: IV induction LMA: LMA inserted LMA Size: 4.0 Number of attempts: 1 Placement Confirmation: positive ETCO2 and breath sounds checked- equal and bilateral Tube secured with: Tape Dental Injury: Teeth and Oropharynx as per pre-operative assessment

## 2021-03-28 DIAGNOSIS — S82851A Displaced trimalleolar fracture of right lower leg, initial encounter for closed fracture: Secondary | ICD-10-CM | POA: Diagnosis not present

## 2021-03-28 DIAGNOSIS — S82891A Other fracture of right lower leg, initial encounter for closed fracture: Secondary | ICD-10-CM | POA: Diagnosis not present

## 2021-03-28 LAB — CBC
HCT: 21.9 % — ABNORMAL LOW (ref 36.0–46.0)
Hemoglobin: 6.6 g/dL — CL (ref 12.0–15.0)
MCH: 26.6 pg (ref 26.0–34.0)
MCHC: 30.1 g/dL (ref 30.0–36.0)
MCV: 88.3 fL (ref 80.0–100.0)
Platelets: 89 10*3/uL — ABNORMAL LOW (ref 150–400)
RBC: 2.48 MIL/uL — ABNORMAL LOW (ref 3.87–5.11)
RDW: 17.5 % — ABNORMAL HIGH (ref 11.5–15.5)
WBC: 11.8 10*3/uL — ABNORMAL HIGH (ref 4.0–10.5)
nRBC: 0 % (ref 0.0–0.2)

## 2021-03-28 LAB — BASIC METABOLIC PANEL
Anion gap: 5 (ref 5–15)
BUN: 22 mg/dL (ref 8–23)
CO2: 28 mmol/L (ref 22–32)
Calcium: 9.2 mg/dL (ref 8.9–10.3)
Chloride: 104 mmol/L (ref 98–111)
Creatinine, Ser: 1 mg/dL (ref 0.44–1.00)
GFR, Estimated: 54 mL/min — ABNORMAL LOW (ref >60)
Glucose, Bld: 117 mg/dL — ABNORMAL HIGH (ref 70–99)
Potassium: 4 mmol/L (ref 3.5–5.1)
Sodium: 137 mmol/L (ref 135–145)

## 2021-03-28 LAB — HEMOGLOBIN AND HEMATOCRIT, BLOOD
HCT: 25.8 % — ABNORMAL LOW (ref 36.0–46.0)
Hemoglobin: 7.8 g/dL — ABNORMAL LOW (ref 12.0–15.0)

## 2021-03-28 LAB — URINALYSIS, ROUTINE W REFLEX MICROSCOPIC
Bilirubin Urine: NEGATIVE
Glucose, UA: NEGATIVE mg/dL
Hgb urine dipstick: NEGATIVE
Ketones, ur: NEGATIVE mg/dL
Nitrite: POSITIVE — AB
Protein, ur: NEGATIVE mg/dL
Specific Gravity, Urine: 1.01 (ref 1.005–1.030)
WBC, UA: >50 WBC/hpf — ABNORMAL HIGH (ref 0–5)
pH: 6 (ref 5.0–8.0)

## 2021-03-28 LAB — PREPARE RBC (CROSSMATCH)

## 2021-03-28 MED ORDER — FUROSEMIDE 10 MG/ML IJ SOLN
20.0000 mg | Freq: Once | INTRAMUSCULAR | Status: AC
  Administered 2021-03-28: 20 mg via INTRAVENOUS
  Filled 2021-03-28: qty 2

## 2021-03-28 MED ORDER — SODIUM CHLORIDE 0.9% IV SOLUTION: Freq: Once | INTRAVENOUS | Status: DC

## 2021-03-28 MED ORDER — ALUM & MAG HYDROXIDE-SIMETH 200-200-20 MG/5ML PO SUSP
15.0000 mL | Freq: Four times a day (QID) | ORAL | Status: DC | PRN
  Administered 2021-03-28: 15 mL via ORAL
  Filled 2021-03-28: qty 30

## 2021-03-28 NOTE — Plan of Care (Signed)
  Problem: Clinical Measurements: Goal: Will remain free from infection Outcome: Progressing   Problem: Nutrition: Goal: Adequate nutrition will be maintained Outcome: Progressing   Problem: Coping: Goal: Level of anxiety will decrease Outcome: Progressing   Problem: Pain Managment: Goal: General experience of comfort will improve Outcome: Progressing   

## 2021-03-28 NOTE — Progress Notes (Signed)
   ORTHOPAEDIC PROGRESS NOTE  s/p Procedure(s): OPEN REDUCTION INTERNAL FIXATION (ORIF) ANKLE FRACTURE  SUBJECTIVE: Reports mild pain about operative site. Nerve block is still in effect. No chest pain. No SOB. No nausea/vomiting. No other complaints.  OBJECTIVE: PE: RLE: splint CDI, unable to test neuro status due to nerve block, warm well perfused foot, no pain w passive stretch   Vitals:   03/28/21 1441 03/28/21 1715  BP: (!) 127/46 (!) 143/43  Pulse: 65 62  Resp: 18 15  Temp: 98.6 F (37 C) 97.9 F (36.6 C)  SpO2: 95% 100%     ASSESSMENT: [name] [name] is a 87 y.o. female s/p ORIF right ankle fracture  PLAN: Weightbearing: NWB RLE Insicional and dressing care: Dressings left intact until follow-up Orthopedic device(s): Splint Showering: Hold for now VTE prophylaxis: SCDs Pain control: PRN pain medications Follow - up plan: 2 weeks in office Dispo: TBD. PT/OT evals pending Contact information:  After hours and holidays please check [email or web address] for group call information for [facility]  Alfonse Alpers, PA-C 03/28/2021

## 2021-03-28 NOTE — Progress Notes (Addendum)
[facility]  PROGRESS NOTE  [name] [name] UXL:[id number] DOB: 1930/12/02 DOA: 03/26/2021 PCP: Mahlon Gammon, MD   Brief HPI:   87 year old female with history of anxiety, hyperlipidemia, hypertension, hypothyroidism presented with ankle pain after fall.  X-ray of ankle showed trimalleolar fracture.  Orthopedics was consulted.  Plan for ORIF today.    Subjective   Complains of numbness in the right foot, also has dysuria.   Assessment/Plan:     Right trimalleolar fracture -Orthopedics following -Discussed with orthopedics on-call, patient had nerve block during surgery so she has numbness of right foot. -ORIF today -Pain control  Pancytopenia -Unclear etiology -Follow CBC in a.m. -Peripheral smear shows normocytic anemia and thrombocytopenia -We will need hematology consultation as outpatient  Dysuria -We will check UA and urine culture  Anemia of  chronic disease -Today hemoglobin of 6.6, will transfuse 1 unit PRBC -Also give 1 dose of Lasix 20 mg IV  Hypertension -Blood pressure is stable -Since patient received Lasix today; will hold Demadex for today and start p.o. Demadex from tomorrow  Anxiety -Continue home regimen  CKD stage III -Creatinine at baseline  Hypothyroidism -Continue Synthroid  Scheduled medications:    sodium chloride   Intravenous Once   ALPRAZolam  0.25 mg Oral QHS   atorvastatin  5 mg Oral Daily   buPROPion  150 mg Oral BID PC   calcium-vitamin D  1 tablet Oral BID   docusate sodium  100 mg Oral BID   ferrous sulfate  325 mg Oral Q M,W,F   gabapentin  100 mg Oral TID   levothyroxine  75 mcg Oral Q0600   losartan  25 mg Oral Daily   omega-3 acid ethyl esters  1 g Oral Daily   pantoprazole  40 mg Oral Daily   senna  1 tablet Oral BID   sertraline  75 mg Oral Daily   torsemide  20 mg Oral Daily         Data Reviewed:   CBG:  No results for input(s): GLUCAP in the last 168 hours.  SpO2: 95 % O2 Flow Rate (L/min): 2  L/min    Vitals:   03/27/21 1755 03/27/21 2027 03/28/21 0603 03/28/21 1441  BP: (!) 106/47 (!) 126/55 (!) 143/52 (!) 127/46  Pulse: 63 77 64 65  Resp: 17 20 20 18   Temp: 97.7 F (36.5 C) 97.7 F (36.5 C) 97.7 F (36.5 C) 98.6 F (37 C)  TempSrc: Oral Oral Oral   SpO2: 98% 94% 97% 95%  Weight:      Height:         Intake/Output Summary (Last 24 hours) at 03/28/2021 1541 Last data filed at 03/28/2021 0900 Gross per 24 hour  Intake 1695.99 ml  Output 50 ml  Net 1645.99 ml    09/01 1901 - 09/03 0700 In: 1576 [P.O.:120; I.V.:1356] Out: 750   Filed Weights   03/26/21 0119 03/26/21 1645  Weight: 78.9 kg 82.9 kg    CBC:  Recent Labs  Lab 03/26/21 0334 03/27/21 0543 03/28/21 0830  WBC 11.0* 9.4 11.8*  HGB 10.0* 8.5* 6.6*  HCT 33.7* 27.5* 21.9*  PLT 46* 95* 89*  MCV 91.6 87.3 88.3  MCH 27.2 27.0 26.6  MCHC 29.7* 30.9 30.1  RDW 17.8* 17.6* 17.5*  LYMPHSABS 1.2  --   --   MONOABS 1.0  --   --   EOSABS 0.0  --   --   BASOSABS 0.1  --   --  Complete metabolic panel:  Recent Labs  Lab 03/26/21 0334 03/26/21 0430 03/27/21 0543 03/28/21 0547  NA 136  --  141 137  K 4.3  --  3.6 4.0  CL 104  --  104 104  CO2 23  --  27 28  GLUCOSE 110*  --  112* 117*  BUN 32*  --  31* 22  CREATININE 1.36*  --  1.29* 1.00  CALCIUM 9.8  --  9.6 9.2  AST  --   --  21  --   ALT  --   --  16  --   ALKPHOS  --   --  69  --   BILITOT  --   --  0.4  --   ALBUMIN  --   --  3.7  --   INR  --  1.0  --   --     No results for input(s): LIPASE, AMYLASE in the last 168 hours.  Recent Labs  Lab 03/26/21 0334  SARSCOV2NAA NEGATIVE    ------------------------------------------------------------------------------------------------------------------ No results for input(s): CHOL, HDL, LDLCALC, TRIG, CHOLHDL, LDLDIRECT in the last 72 hours.  No results found for:  HGBA1C ------------------------------------------------------------------------------------------------------------------ No results for input(s): TSH, T4TOTAL, T3FREE, THYROIDAB in the last 72 hours.  Invalid input(s): FREET3 ------------------------------------------------------------------------------------------------------------------ No results for input(s): VITAMINB12, FOLATE, FERRITIN, TIBC, IRON, RETICCTPCT in the last 72 hours.  Coagulation profile Recent Labs  Lab 03/26/21 0430  INR 1.0   No results for input(s): DDIMER in the last 72 hours.  Cardiac Enzymes No results for input(s): CKTOTAL, CKMB, CKMBINDEX, TROPONINI in the last 168 hours.  ------------------------------------------------------------------------------------------------------------------ No results found for: BNP   Antibiotics: Anti-infectives (From admission, onward)    Start     Dose/Rate Route Frequency Ordered Stop   03/27/21 2100  ceFAZolin (ANCEF) IVPB 2g/100 mL premix        2 g 200 mL/hr over 30 Minutes Intravenous Every 6 hours 03/27/21 1753 03/28/21 1359   03/27/21 0630  ceFAZolin (ANCEF) IVPB 2g/100 mL premix        2 g 200 mL/hr over 30 Minutes Intravenous On call to O.R. 03/27/21 0537 03/27/21 1455        Radiology Reports  No results found.    DVT prophylaxis: SCDs  Code Status: Partial code  Family Communication: No family at bedside   Consultants: Orthopedics  Procedures:     Objective    Physical Examination:   General-appears in no acute distress Heart-S1-S2, regular, no murmur auscultated Lungs-clear to auscultation bilaterally, no wheezing or crackles auscultated Abdomen-soft, nontender, no organomegaly Extremities-decreased sensation right toes, able to move her toes.  Right  foot in compression bandage dressing Neuro-alert, oriented x3, no focal deficit noted   Status is: Inpatient  Dispo: The patient is from: Home              Anticipated  d/c is to: Skilled nursing facility              Anticipated d/c date is: 03/30/2021              Patient currently not stable for discharge  Barrier to discharge-ORIF for trimalleolar fracture  COVID-19 Labs  No results for input(s): DDIMER, FERRITIN, LDH, CRP in the last 72 hours.  Lab Results  Component Value Date   SARSCOV2NAA NEGATIVE 03/26/2021   SARSCOV2NAA Not Detected 01/22/2019    Microbiology  Recent Results (from the past 240 hour(s))  Resp Panel by RT-PCR (Flu A&B, Covid) Nasopharyngeal Swab  Status: None   Collection Time: 03/26/21  3:34 AM   Specimen: Nasopharyngeal Swab; Nasopharyngeal(NP) swabs in vial transport medium  Result Value Ref Range Status   SARS Coronavirus 2 by RT PCR NEGATIVE NEGATIVE Final    Comment: (NOTE) SARS-CoV-2 target nucleic acids are NOT DETECTED.  The SARS-CoV-2 RNA is generally detectable in upper respiratory specimens during the acute phase of infection. The lowest concentration of SARS-CoV-2 viral copies this assay can detect is 138 copies/mL. A negative result does not preclude SARS-Cov-2 infection and should not be used as the sole basis for treatment or other patient management decisions. A negative result may occur with  improper specimen collection/handling, submission of specimen other than nasopharyngeal swab, presence of viral mutation(s) within the areas targeted by this assay, and inadequate number of viral copies(<138 copies/mL). A negative result must be combined with clinical observations, patient history, and epidemiological information. The expected result is Negative.  Fact Sheet for Patients:  BloggerCourse.com  Fact Sheet for Healthcare Providers:  SeriousBroker.it  This test is no t yet approved or cleared by the [place] FDA and  has been authorized for detection and/or diagnosis of SARS-CoV-2 by FDA under an Emergency Use Authorization (EUA). This  EUA will remain  in effect (meaning this test can be used) for the duration of the COVID-19 declaration under Section 564(b)(1) of the Act, 21 U.S.C.section 360bbb-3(b)(1), unless the authorization is terminated  or revoked sooner.       Influenza A by PCR NEGATIVE NEGATIVE Final   Influenza B by PCR NEGATIVE NEGATIVE Final    Comment: (NOTE) The Xpert Xpress SARS-CoV-2/FLU/RSV plus assay is intended as an aid in the diagnosis of influenza from Nasopharyngeal swab specimens and should not be used as a sole basis for treatment. Nasal washings and aspirates are unacceptable for Xpert Xpress SARS-CoV-2/FLU/RSV testing.  Fact Sheet for Patients: BloggerCourse.com  Fact Sheet for Healthcare Providers: SeriousBroker.it  This test is not yet approved or cleared by the [place] FDA and has been authorized for detection and/or diagnosis of SARS-CoV-2 by FDA under an Emergency Use Authorization (EUA). This EUA will remain in effect (meaning this test can be used) for the duration of the COVID-19 declaration under Section 564(b)(1) of the Act, 21 U.S.C. section 360bbb-3(b)(1), unless the authorization is terminated or revoked.  Performed at [facility], [address], [place], [place] [address]              Meredeth Ide   [facility] If 7PM-7AM, please contact night-coverage at [email or web address], Office  [phone]   03/28/2021, 3:41 PM  LOS: 2 days

## 2021-03-28 NOTE — Progress Notes (Signed)
Physical Therapy Treatment Patient Details Name: [name] [name] MRN: [id number] DOB: 1931/03/31 Today's Date: 03/28/2021    History of Present Illness 87 yo female presented to ED 03/26/2021 after fall in her Assited living apartment at [facility]. She sustained a R trimalleolar fracture and is s/p ORIF of R ankle on 03/27/2021. PMH: R TKA 2013, and compression fx lumbar with kyphoplast 2018.    PT Comments    Pt now s/p R ankle ORIF with NWB orders (03/27/21). Goals from initial eval still appropriate.  Pt with 6.6 Hgb and nursing stated no transfers, so worked on bed mobility and therex.  SNF continues to be an appropriate d/c.   Follow Up Recommendations  SNF     Equipment Recommendations  None recommended by PT    Recommendations for Other Services       Precautions / Restrictions Precautions Precautions: Fall Required Braces or Orthoses: Splint/Cast Splint/Cast: R ankl Restrictions Weight Bearing Restrictions: Yes RLE Weight Bearing: Non weight bearing    Mobility  Bed Mobility   Bed Mobility: Rolling Rolling: Min assist         General bed mobility comments: rolling with use of bed rails and A for R LE    Transfers                 General transfer comment: deferred due to Hgb  Ambulation/Gait                 Stairs             Wheelchair Mobility    Modified Rankin (Stroke Patients Only)       Balance                                            Cognition Arousal/Alertness: Awake/alert Behavior During Therapy: WFL for tasks assessed/performed Overall Cognitive Status: Within Functional Limits for tasks assessed                                        Exercises Total Joint Exercises Quad Sets: Strengthening;5 reps Gluteal Sets: Strengthening;5 reps General Exercises - Lower Extremity Heel Slides: AAROM;Both;5 reps Hip ABduction/ADduction: AAROM;Both;5 reps    General Comments         Pertinent Vitals/Pain Pain Assessment: Faces Faces Pain Scale: Hurts little more Pain Location: R LE with lifting in bed and re-positioning Pain Descriptors / Indicators: Grimacing Pain Intervention(s): Limited activity within patient's tolerance;Monitored during session;Repositioned    Home Living                      Prior Function            PT Goals (current goals can now be found in the care plan section) Acute Rehab PT Goals PT Goal Formulation: With patient Time For Goal Achievement: 04/09/21 Potential to Achieve Goals: Good Progress towards PT goals: Not progressing toward goals - comment (limited by low Hgb today)    Frequency    Min 2X/week      PT Plan Current plan remains appropriate    Co-evaluation              AM-PAC PT "6 Clicks" Mobility   Outcome Measure  Help needed turning from your back to your  side while in a flat bed without using bedrails?: Total Help needed moving from lying on your back to sitting on the side of a flat bed without using bedrails?: Total Help needed moving to and from a bed to a chair (including a wheelchair)?: Total Help needed standing up from a chair using your arms (e.g., wheelchair or bedside chair)?: Total Help needed to walk in hospital room?: Total Help needed climbing 3-5 steps with a railing? : Total 6 Click Score: 6    End of Session     Patient left: in bed;with call bell/phone within reach;with bed alarm set Nurse Communication: Other (comment) (Hgb) PT Visit Diagnosis: Other abnormalities of gait and mobility (R26.89);Muscle weakness (generalized) (M62.81);History of falling (Z91.81)     Time: 1610-9604 PT Time Calculation (min) (ACUTE ONLY): 16 min  Charges:  $Therapeutic Exercise: 8-22 mins                     [name] [name], PT  03/28/2021    [name] [name] 03/28/2021, 10:16 AM

## 2021-03-29 ENCOUNTER — Inpatient Hospital Stay (HOSPITAL_COMMUNITY): Payer: Medicare Other

## 2021-03-29 DIAGNOSIS — S82851A Displaced trimalleolar fracture of right lower leg, initial encounter for closed fracture: Secondary | ICD-10-CM | POA: Diagnosis not present

## 2021-03-29 DIAGNOSIS — S82891A Other fracture of right lower leg, initial encounter for closed fracture: Secondary | ICD-10-CM | POA: Diagnosis not present

## 2021-03-29 MED ORDER — SODIUM CHLORIDE 0.9 % IV SOLN
1.0000 g | INTRAVENOUS | Status: DC
  Administered 2021-03-29 – 2021-03-31 (×3): 1 g via INTRAVENOUS
  Filled 2021-03-29: qty 1
  Filled 2021-03-29 (×3): qty 10

## 2021-03-29 NOTE — Progress Notes (Signed)
Physical Therapy Treatment Patient Details Name: [name] [name] MRN: [id number] DOB: 07/31/1930 Today's Date: 03/29/2021    History of Present Illness 87 yo female presented to ED 03/26/2021 after fall in her Assited living apartment at [facility]. She sustained a R trimalleolar fracture and is s/p ORIF of R ankle on 03/27/2021. PMH: R TKA 2013, and compression fx lumbar with kyphoplast 2018.    PT Comments    Pt seen today fo rPT at request of staff and pt son. Pt son with multiple appropriate questions regarding d/c plan, goals, pt rehab potential etc.  Pt and family may benefit from Palliative Care Consult as this is certainly a life changing injury for pt;   she was minimally ambulatory prior to fall and is severely deconditioned, cooperative with PT however pt is not overly motivated to rehab/mobilize. Will continue to follow, recommend pt transition to Healthcare (SNF) at [facility].   Follow Up Recommendations  SNF     Equipment Recommendations  None recommended by PT    Recommendations for Other Services       Precautions / Restrictions Precautions Precautions: Fall Required Braces or Orthoses: Splint/Cast Splint/Cast: R ankle Restrictions RLE Weight Bearing: Non weight bearing    Mobility  Bed Mobility Overal bed mobility: Needs Assistance Bed Mobility: Supine to Sit     Supine to sit: Mod assist;+2 for physical assistance;+2 for safety/equipment;HOB elevated     General bed mobility comments: assist with trunk and LEs, decr initiation and incr time, HOB elevated, bed pad used to scoot to EOB. pt sleeps in recliner at baseline    Transfers Overall transfer level: Needs assistance   Transfers: Lateral/Scoot Transfers          Lateral/Scoot Transfers: +2 physical assistance;+2 safety/equipment;Max assist General transfer comment: bed pad used to assist with lateral scooting transfer bed to chair to pt L, verbal cues for pt to self assist with LLE and  UEs  Ambulation/Gait             General Gait Details: per pt son pt was minimally amb prior to fall   Stairs             Wheelchair Mobility    Modified Rankin (Stroke Patients Only)       Balance   Sitting-balance support: No upper extremity supported;Feet unsupported                                        Cognition Arousal/Alertness: Awake/alert Behavior During Therapy: Flat affect Overall Cognitive Status: Impaired/Different from baseline Area of Impairment: Following commands;Problem solving                       Following Commands: Follows one step commands with increased time;Follows multi-step commands with increased time     Problem Solving: Slow processing;Difficulty sequencing;Requires verbal cues;Requires tactile cues;Decreased initiation        Exercises General Exercises - Lower Extremity Long Arc Quad: AROM;Strengthening;Left;10 reps;Seated    General Comments        Pertinent Vitals/Pain Pain Assessment: Faces Faces Pain Scale: Hurts even more Pain Location: R LE with lifting in bed and re-positioning, back, L ankle and leg (diffuse) Pain Descriptors / Indicators: Grimacing;Moaning Pain Intervention(s): Limited activity within patient's tolerance;Monitored during session;Repositioned    Home Living  Prior Function            PT Goals (current goals can now be found in the care plan section) Acute Rehab PT Goals Patient Stated Goal: I want to stay well and do as much as I can for myself PT Goal Formulation: With patient Time For Goal Achievement: 04/09/21 Potential to Achieve Goals: Fair Progress towards PT goals: Progressing toward goals    Frequency    Min 2X/week      PT Plan Current plan remains appropriate    Co-evaluation              AM-PAC PT "6 Clicks" Mobility   Outcome Measure  Help needed turning from your back to your side while in a flat  bed without using bedrails?: Total Help needed moving from lying on your back to sitting on the side of a flat bed without using bedrails?: Total Help needed moving to and from a bed to a chair (including a wheelchair)?: Total Help needed standing up from a chair using your arms (e.g., wheelchair or bedside chair)?: Total Help needed to walk in hospital room?: Total Help needed climbing 3-5 steps with a railing? : Total 6 Click Score: 6    End of Session Equipment Utilized During Treatment: Oxygen (left O2 on at 3L) Activity Tolerance: Patient tolerated treatment well Patient left: with call bell/phone within reach;in chair;with chair alarm set Nurse Communication: Mobility status;Need for lift equipment PT Visit Diagnosis: Other abnormalities of gait and mobility (R26.89);Muscle weakness (generalized) (M62.81);History of falling (Z91.81)     Time: 0177-9390 PT Time Calculation (min) (ACUTE ONLY): 25 min  Charges:  $Therapeutic Activity: 23-37 mins                     Delice Bison, PT  Acute Rehab Dept ([facility]) [phone] Pager [phone]  03/29/2021    [facility] 03/29/2021, 1:40 PM

## 2021-03-29 NOTE — Progress Notes (Signed)
Subjective: 2 Days Post-Op s/p Procedure(s): OPEN REDUCTION INTERNAL FIXATION (ORIF) ANKLE FRACTURE   Patient is alert, oriented, sitting up in bed.  Patient reports pain as mild, worse at left knee than at right ankle.  Denies chest pain, SOB, Calf pain. No nausea/vomiting. No other complaints.  Objective:  PE: VITALS:   Vitals:   03/28/21 1715 03/28/21 1800 03/28/21 2119 03/29/21 0539  BP: (!) 143/43 (!) 141/51 (!) 141/40 (!) 153/44  Pulse: 62 71 62 63  Resp: 15 16 20 20   Temp: 97.9 F (36.6 C) 98.4 F (36.9 C) 97.7 F (36.5 C) 97.6 F (36.4 C)  TempSrc: Oral Oral Oral Oral  SpO2: 100% 97% 94% 96%  Weight:      Height:       General: sitting up in bed, in no acute distress Resp: no use of accessory musculature MSK; RLE  in splint. Able to flex and extend all toes. Sensation intact to all toes. Capillary refill intact. Ecchymosis at knee that is tender. LLE - ecchymosis to medial right knee. Able to flex approximately 20 degrees while supine. Pain with Passive flexion beyond 40 degrees.   LABS  Results for orders placed or performed during the hospital encounter of 03/26/21 (from the past 24 hour(s))  Prepare RBC (crossmatch)     Status: None   Collection Time: 03/28/21 10:20 AM  Result Value Ref Range   Order Confirmation      ORDER PROCESSED BY BLOOD BANK Performed at [facility], [address] [place], [place]   CBC     Status: Abnormal   Collection Time: 03/28/21  3:32 PM  Result Value Ref Range   WBC 13.5 (H) 4.0 - 10.5 K/uL   RBC 2.39 (L) 3.87 - 5.11 MIL/uL   Hemoglobin 6.6 (LL) 12.0 - 15.0 g/dL   HCT 05/28/21 (L) 36.6 - 44.0 %   MCV 89.1 80.0 - 100.0 fL   MCH 27.6 26.0 - 34.0 pg   MCHC 31.0 30.0 - 36.0 g/dL   RDW 34.7 (H) 42.5 - 95.6 %   Platelets 92 (L) 150 - 400 K/uL   nRBC 0.0 0.0 - 0.2 %  Urinalysis, Routine w reflex microscopic Urine, Clean Catch     Status: Abnormal   Collection Time: 03/28/21  4:54 PM  Result  Value Ref Range   Color, Urine YELLOW (A) YELLOW   APPearance HAZY (A) CLEAR   Specific Gravity, Urine 1.010 1.005 - 1.030   pH 6.0 5.0 - 8.0   Glucose, UA NEGATIVE NEGATIVE mg/dL   Hgb urine dipstick NEGATIVE NEGATIVE   Bilirubin Urine NEGATIVE NEGATIVE   Ketones, ur NEGATIVE NEGATIVE mg/dL   Protein, ur NEGATIVE NEGATIVE mg/dL   Nitrite POSITIVE (A) NEGATIVE   Leukocytes,Ua LARGE (A) NEGATIVE   WBC, UA >50 (H) 0 - 5 WBC/hpf   Bacteria, UA FEW (A) NONE SEEN  Hemoglobin and hematocrit, blood     Status: Abnormal   Collection Time: 03/28/21 10:48 PM  Result Value Ref Range   Hemoglobin 7.8 (L) 12.0 - 15.0 g/dL   HCT 05/28/21 (L) 56.4 - 33.2 %    No results found.  Assessment/Plan:  Active Problems:   Ankle fracture  2 Days Post-Op s/p Procedure(s): OPEN REDUCTION INTERNAL FIXATION (ORIF) ANKLE FRACTURE Weightbearing: NWB RLE Orthopedic device(s): Splint Showering: Hold for now  Left knee pain: - will get x-rays of left knee today  VTE prophylaxis: SCDs - will start lovenox while inpatient  when Hbg stable. Currently increased 6.6 to 7.8 after transfusion yesterday, holding lovenox at this time Pain control: PRN pain medications Follow - up plan: 2 weeks in office Dispo: TBD. PT/OT evals pending, son request healthcare [facility]   Contact information:  After hours and holidays please check [email or web address] for group call information for [facility] Contact information:   Weekdays 8-5 Janine Ores, [place] [phone] A fter hours and holidays please check [email or web address] for group call information for [facility]  Armida Sans 03/29/2021, 9:29 AM

## 2021-03-29 NOTE — Progress Notes (Signed)
[facility]  PROGRESS NOTE  [name] [name] ZOX:[id number] DOB: 03/12/1931 DOA: 03/26/2021 PCP: Mahlon GammonGupta, Anjali L, MD   Brief HPI:   87 year old female with history of anxiety, hyperlipidemia, hypertension, hypothyroidism presented with ankle pain after fall.  X-ray of ankle showed trimalleolar fracture.  Orthopedics was consulted.  Plan for ORIF today.    Subjective   Complain of dysuria yesterday, she was well.  Urine culture obtained is growing 20,000 colonies per mL of E. coli.  Patient started on IV ceftriaxone.   Assessment/Plan:     Right trimalleolar fracture -S/p ORIF -Orthopedics has cleared for discharge to skilled nursing facility   Pancytopenia -Unclear etiology -Follow CBC in a.m. -Peripheral smear shows normocytic anemia and thrombocytopenia -We will need hematology consultation as outpatient  UTI -Patient complains of dysuria, urine culture obtained which is growing E. coli, 20,000 colonies per mL -IV ceftriaxone has been started. -We will follow final culture results.  Anemia of  chronic disease -Hemoglobin 6.6 yesterday -Received 1 unit PRBC -Today hemoglobin is 7.8  Hypertension -Blood pressure is stable -Continue Demadex  Anxiety -Continue home regimen  CKD stage III -Creatinine at baseline  Hypothyroidism -Continue Synthroid  Scheduled medications:    sodium chloride   Intravenous Once   ALPRAZolam  0.25 mg Oral QHS   atorvastatin  5 mg Oral Daily   buPROPion  150 mg Oral BID PC   calcium-vitamin D  1 tablet Oral BID   docusate sodium  100 mg Oral BID   ferrous sulfate  325 mg Oral Q M,W,F   gabapentin  100 mg Oral TID   levothyroxine  75 mcg Oral Q0600   losartan  25 mg Oral Daily   omega-3 acid ethyl esters  1 g Oral Daily   pantoprazole  40 mg Oral Daily   senna  1 tablet Oral BID   sertraline  75 mg Oral Daily   torsemide  20 mg Oral Daily         Data Reviewed:   CBG:  No results for input(s): GLUCAP in the last  168 hours.  SpO2: 96 % O2 Flow Rate (L/min): 3 L/min    Vitals:   03/28/21 1715 03/28/21 1800 03/28/21 2119 03/29/21 0539  BP: (!) 143/43 (!) 141/51 (!) 141/40 (!) 153/44  Pulse: 62 71 62 63  Resp: 15 16 20 20   Temp: 97.9 F (36.6 C) 98.4 F (36.9 C) 97.7 F (36.5 C) 97.6 F (36.4 C)  TempSrc: Oral Oral Oral Oral  SpO2: 100% 97% 94% 96%  Weight:      Height:         Intake/Output Summary (Last 24 hours) at 03/29/2021 1504 Last data filed at 03/29/2021 1300 Gross per 24 hour  Intake 720 ml  Output 850 ml  Net -130 ml    09/02 1901 - 09/04 0700 In: 1396 [P.O.:480; I.V.:456] Out: 850 [Urine:850]  Filed Weights   03/26/21 0119 03/26/21 1645  Weight: 78.9 kg 82.9 kg    CBC:  Recent Labs  Lab 03/26/21 0334 03/27/21 0543 03/28/21 0830 03/28/21 1532 03/28/21 2248  WBC 11.0* 9.4 11.8* 13.5*  --   HGB 10.0* 8.5* 6.6* 6.6* 7.8*  HCT 33.7* 27.5* 21.9* 21.3* 25.8*  PLT 46* 95* 89* 92*  --   MCV 91.6 87.3 88.3 89.1  --   MCH 27.2 27.0 26.6 27.6  --   MCHC 29.7* 30.9 30.1 31.0  --   RDW 17.8* 17.6* 17.5* 17.8*  --   LYMPHSABS  1.2  --   --   --   --   MONOABS 1.0  --   --   --   --   EOSABS 0.0  --   --   --   --   BASOSABS 0.1  --   --   --   --     Complete metabolic panel:  Recent Labs  Lab 03/26/21 0334 03/26/21 0430 03/27/21 0543 03/28/21 0547  NA 136  --  141 137  K 4.3  --  3.6 4.0  CL 104  --  104 104  CO2 23  --  27 28  GLUCOSE 110*  --  112* 117*  BUN 32*  --  31* 22  CREATININE 1.36*  --  1.29* 1.00  CALCIUM 9.8  --  9.6 9.2  AST  --   --  21  --   ALT  --   --  16  --   ALKPHOS  --   --  69  --   BILITOT  --   --  0.4  --   ALBUMIN  --   --  3.7  --   INR  --  1.0  --   --     No results for input(s): LIPASE, AMYLASE in the last 168 hours.  Recent Labs  Lab 03/26/21 0334  SARSCOV2NAA NEGATIVE    ------------------------------------------------------------------------------------------------------------------ No results for  input(s): CHOL, HDL, LDLCALC, TRIG, CHOLHDL, LDLDIRECT in the last 72 hours.  No results found for: HGBA1C ------------------------------------------------------------------------------------------------------------------ No results for input(s): TSH, T4TOTAL, T3FREE, THYROIDAB in the last 72 hours.  Invalid input(s): FREET3 ------------------------------------------------------------------------------------------------------------------ No results for input(s): VITAMINB12, FOLATE, FERRITIN, TIBC, IRON, RETICCTPCT in the last 72 hours.  Coagulation profile Recent Labs  Lab 03/26/21 0430  INR 1.0   No results for input(s): DDIMER in the last 72 hours.  Cardiac Enzymes No results for input(s): CKTOTAL, CKMB, CKMBINDEX, TROPONINI in the last 168 hours.  ------------------------------------------------------------------------------------------------------------------ No results found for: BNP   Antibiotics: Anti-infectives (From admission, onward)    Start     Dose/Rate Route Frequency Ordered Stop   03/29/21 1000  cefTRIAXone (ROCEPHIN) 1 g in sodium chloride 0.9 % 100 mL IVPB        1 g 200 mL/hr over 30 Minutes Intravenous Every 24 hours 03/29/21 0855     03/27/21 2100  ceFAZolin (ANCEF) IVPB 2g/100 mL premix        2 g 200 mL/hr over 30 Minutes Intravenous Every 6 hours 03/27/21 1753 03/28/21 1359   03/27/21 0630  ceFAZolin (ANCEF) IVPB 2g/100 mL premix        2 g 200 mL/hr over 30 Minutes Intravenous On call to O.R. 03/27/21 0537 03/27/21 1455        Radiology Reports  No results found.    DVT prophylaxis: SCDs  Code Status: Partial code  Family Communication: No family at bedside   Consultants: Orthopedics  Procedures:     Objective    Physical Examination:   General-appears in no acute distress Heart-S1-S2, regular, no murmur auscultated Lungs-clear to auscultation bilaterally, no wheezing or crackles auscultated Abdomen-soft, nontender, no  organomegaly Extremities-no edema in the lower extremities Neuro-alert, oriented x3, no focal deficit noted   Status is: Inpatient  Dispo: The patient is from: Home              Anticipated d/c is to: Skilled nursing facility  Anticipated d/c date is: 03/31/2021              Patient currently  stable for discharge  Barrier to discharge-awaiting bed at skilled nursing facility  COVID-19 Labs  No results for input(s): DDIMER, FERRITIN, LDH, CRP in the last 72 hours.  Lab Results  Component Value Date   SARSCOV2NAA NEGATIVE 03/26/2021   SARSCOV2NAA Not Detected 01/22/2019    Microbiology  Recent Results (from the past 240 hour(s))  Resp Panel by RT-PCR (Flu A&B, Covid) Nasopharyngeal Swab     Status: None   Collection Time: 03/26/21  3:34 AM   Specimen: Nasopharyngeal Swab; Nasopharyngeal(NP) swabs in vial transport medium  Result Value Ref Range Status   SARS Coronavirus 2 by RT PCR NEGATIVE NEGATIVE Final    Comment: (NOTE) SARS-CoV-2 target nucleic acids are NOT DETECTED.  The SARS-CoV-2 RNA is generally detectable in upper respiratory specimens during the acute phase of infection. The lowest concentration of SARS-CoV-2 viral copies this assay can detect is 138 copies/mL. A negative result does not preclude SARS-Cov-2 infection and should not be used as the sole basis for treatment or other patient management decisions. A negative result may occur with  improper specimen collection/handling, submission of specimen other than nasopharyngeal swab, presence of viral mutation(s) within the areas targeted by this assay, and inadequate number of viral copies(<138 copies/mL). A negative result must be combined with clinical observations, patient history, and epidemiological information. The expected result is Negative.  Fact Sheet for Patients:  BloggerCourse.com  Fact Sheet for Healthcare Providers:   SeriousBroker.it  This test is no t yet approved or cleared by the [place] FDA and  has been authorized for detection and/or diagnosis of SARS-CoV-2 by FDA under an Emergency Use Authorization (EUA). This EUA will remain  in effect (meaning this test can be used) for the duration of the COVID-19 declaration under Section 564(b)(1) of the Act, 21 U.S.C.section 360bbb-3(b)(1), unless the authorization is terminated  or revoked sooner.       Influenza A by PCR NEGATIVE NEGATIVE Final   Influenza B by PCR NEGATIVE NEGATIVE Final    Comment: (NOTE) The Xpert Xpress SARS-CoV-2/FLU/RSV plus assay is intended as an aid in the diagnosis of influenza from Nasopharyngeal swab specimens and should not be used as a sole basis for treatment. Nasal washings and aspirates are unacceptable for Xpert Xpress SARS-CoV-2/FLU/RSV testing.  Fact Sheet for Patients: BloggerCourse.com  Fact Sheet for Healthcare Providers: SeriousBroker.it  This test is not yet approved or cleared by the [place] FDA and has been authorized for detection and/or diagnosis of SARS-CoV-2 by FDA under an Emergency Use Authorization (EUA). This EUA will remain in effect (meaning this test can be used) for the duration of the COVID-19 declaration under Section 564(b)(1) of the Act, 21 U.S.C. section 360bbb-3(b)(1), unless the authorization is terminated or revoked.  Performed at [facility], [address] [place], [place] [address]   Urine Culture     Status: Abnormal (Preliminary result)   Collection Time: 03/28/21  4:54 PM   Specimen: Urine, Clean Catch  Result Value Ref Range Status   Specimen Description   Final    URINE, CLEAN CATCH Performed at [facility], [address] [place], [place] [address]    Special Requests   Final    NONE Performed at [facility], [address] [place], [place] [address]    Culture (A)  Final    20,000  COLONIES/mL ESCHERICHIA COLI SUSCEPTIBILITIES TO FOLLOW Performed at [facility], [address] [place], [place] [address]    Report Status PENDING  Incomplete         Meredeth Ide   [facility] If 7PM-7AM, please contact night-coverage at [email or web address], Office  [phone]   03/29/2021, 3:04 PM  LOS: 3 days

## 2021-03-30 DIAGNOSIS — S82851A Displaced trimalleolar fracture of right lower leg, initial encounter for closed fracture: Secondary | ICD-10-CM | POA: Diagnosis not present

## 2021-03-30 DIAGNOSIS — N390 Urinary tract infection, site not specified: Secondary | ICD-10-CM

## 2021-03-30 DIAGNOSIS — S82891A Other fracture of right lower leg, initial encounter for closed fracture: Secondary | ICD-10-CM | POA: Diagnosis not present

## 2021-03-30 LAB — CBC
HCT: 24.6 % — ABNORMAL LOW (ref 36.0–46.0)
Hemoglobin: 7.6 g/dL — ABNORMAL LOW (ref 12.0–15.0)
MCH: 25.8 pg — ABNORMAL LOW (ref 26.0–34.0)
MCHC: 30.9 g/dL (ref 30.0–36.0)
MCV: 83.4 fL (ref 80.0–100.0)
Platelets: 83 10*3/uL — ABNORMAL LOW (ref 150–400)
RBC: 2.95 MIL/uL — ABNORMAL LOW (ref 3.87–5.11)
RDW: 20.6 % — ABNORMAL HIGH (ref 11.5–15.5)
WBC: 10.7 10*3/uL — ABNORMAL HIGH (ref 4.0–10.5)
nRBC: 0 % (ref 0.0–0.2)

## 2021-03-30 LAB — URINE CULTURE: Culture: 20000 — AB

## 2021-03-30 LAB — TYPE AND SCREEN
ABO/RH(D): O POS
Antibody Screen: NEGATIVE
Unit division: 0

## 2021-03-30 LAB — BASIC METABOLIC PANEL
Anion gap: 6 (ref 5–15)
BUN: 36 mg/dL — ABNORMAL HIGH (ref 8–23)
CO2: 31 mmol/L (ref 22–32)
Calcium: 8.9 mg/dL (ref 8.9–10.3)
Chloride: 100 mmol/L (ref 98–111)
Creatinine, Ser: 1.31 mg/dL — ABNORMAL HIGH (ref 0.44–1.00)
GFR, Estimated: 39 mL/min — ABNORMAL LOW (ref >60)
Glucose, Bld: 101 mg/dL — ABNORMAL HIGH (ref 70–99)
Potassium: 3.5 mmol/L (ref 3.5–5.1)
Sodium: 137 mmol/L (ref 135–145)

## 2021-03-30 LAB — SARS CORONAVIRUS 2 (TAT 6-24 HRS): SARS Coronavirus 2: NEGATIVE

## 2021-03-30 LAB — BPAM RBC
Blood Product Expiration Date: 202210032359
ISSUE DATE / TIME: 202209031737
Unit Type and Rh: 5100

## 2021-03-30 MED ORDER — ASPIRIN EC 81 MG PO TBEC: 81.0000 mg | DELAYED_RELEASE_TABLET | Freq: Two times a day (BID) | ORAL | Status: DC

## 2021-03-30 MED ORDER — ASPIRIN EC 81 MG PO TBEC
81.0000 mg | DELAYED_RELEASE_TABLET | Freq: Two times a day (BID) | ORAL | Status: DC
  Administered 2021-03-30 – 2021-03-31 (×3): 81 mg via ORAL
  Filled 2021-03-30 (×3): qty 1

## 2021-03-30 NOTE — Progress Notes (Addendum)
[facility]  PROGRESS NOTE  [name] [name] BZJ:[id number] DOB: 08-26-1930 DOA: 03/26/2021 PCP: Mahlon Gammon, MD   Brief HPI:   87 year old female with history of anxiety, hyperlipidemia, hypertension, hypothyroidism presented with ankle pain after fall.  X-ray of ankle showed trimalleolar fracture.  Orthopedics was consulted.  Plan for ORIF today.    Subjective   Patient seen and examined, no new complaints.  Urine culture growing E. coli 20,000 colonies per mL.   Assessment/Plan:     Right trimalleolar fracture -S/p ORIF -Orthopedics has cleared for discharge to skilled nursing facility   Pancytopenia -Unclear etiology -Follow CBC in a.m. -Peripheral smear shows normocytic anemia and thrombocytopenia -We will need hematology consultation as outpatient  UTI -Patient complains of dysuria, urine culture obtained which is growing E. coli, 20,000 colonies per mL -IV ceftriaxone has been started. -Urine culture grew E. coli, sensitive to ceftriaxone and cefazolin. -We will switch to Keflex on discharge in a.m.  Anemia of  chronic disease -Hemoglobin 6.6 yesterday -Received 1 unit PRBC -Today hemoglobin is 7.6 -Patient is on ferrous sulfate supplementation  Hypertension -Blood pressure is stable -Continue Demadex  Anxiety -Continue home regimen  CKD stage III -Creatinine at baseline  Hypothyroidism -Continue Synthroid  Scheduled medications:    sodium chloride   Intravenous Once   ALPRAZolam  0.25 mg Oral QHS   aspirin EC  81 mg Oral BID   atorvastatin  5 mg Oral Daily   buPROPion  150 mg Oral BID PC   calcium-vitamin D  1 tablet Oral BID   docusate sodium  100 mg Oral BID   ferrous sulfate  325 mg Oral Q M,W,F   gabapentin  100 mg Oral TID   levothyroxine  75 mcg Oral Q0600   losartan  25 mg Oral Daily   omega-3 acid ethyl esters  1 g Oral Daily   pantoprazole  40 mg Oral Daily   senna  1 tablet Oral BID   sertraline  75 mg Oral Daily   torsemide   20 mg Oral Daily         Data Reviewed:   CBG:  No results for input(s): GLUCAP in the last 168 hours.  SpO2: 99 % O2 Flow Rate (L/min): 2 L/min    Vitals:   03/29/21 0539 03/29/21 2057 03/30/21 0618 03/30/21 1234  BP: (!) 153/44 (!) 138/51 (!) 133/57 (!) 151/52  Pulse: 63 66 66 67  Resp: 20 20 20 18   Temp: 97.6 F (36.4 C) 97.6 F (36.4 C) 97.9 F (36.6 C) 98.4 F (36.9 C)  TempSrc: Oral Oral Oral Oral  SpO2: 96% 98% 96% 99%  Weight:      Height:         Intake/Output Summary (Last 24 hours) at 03/30/2021 1606 Last data filed at 03/30/2021 1500 Gross per 24 hour  Intake 700 ml  Output --  Net 700 ml    09/03 1901 - 09/05 0700 In: 840 [P.O.:480] Out: 850 [Urine:850]  Filed Weights   03/26/21 0119 03/26/21 1645  Weight: 78.9 kg 82.9 kg    CBC:  Recent Labs  Lab 03/26/21 0334 03/27/21 0543 03/28/21 0830 03/28/21 1532 03/28/21 2248 03/30/21 0503  WBC 11.0* 9.4 11.8* 13.5*  --  10.7*  HGB 10.0* 8.5* 6.6* 6.6* 7.8* 7.6*  HCT 33.7* 27.5* 21.9* 21.3* 25.8* 24.6*  PLT 46* 95* 89* 92*  --  83*  MCV 91.6 87.3 88.3 89.1  --  83.4  MCH 27.2 27.0 26.6  27.6  --  25.8*  MCHC 29.7* 30.9 30.1 31.0  --  30.9  RDW 17.8* 17.6* 17.5* 17.8*  --  20.6*  LYMPHSABS 1.2  --   --   --   --   --   MONOABS 1.0  --   --   --   --   --   EOSABS 0.0  --   --   --   --   --   BASOSABS 0.1  --   --   --   --   --     Complete metabolic panel:  Recent Labs  Lab 03/26/21 0334 03/26/21 0430 03/27/21 0543 03/28/21 0547 03/30/21 0503  NA 136  --  141 137 137  K 4.3  --  3.6 4.0 3.5  CL 104  --  104 104 100  CO2 23  --  27 28 31   GLUCOSE 110*  --  112* 117* 101*  BUN 32*  --  31* 22 36*  CREATININE 1.36*  --  1.29* 1.00 1.31*  CALCIUM 9.8  --  9.6 9.2 8.9  AST  --   --  21  --   --   ALT  --   --  16  --   --   ALKPHOS  --   --  69  --   --   BILITOT  --   --  0.4  --   --   ALBUMIN  --   --  3.7  --   --   INR  --  1.0  --   --   --     No results for  input(s): LIPASE, AMYLASE in the last 168 hours.  Recent Labs  Lab 03/26/21 0334  SARSCOV2NAA NEGATIVE    ------------------------------------------------------------------------------------------------------------------ No results for input(s): CHOL, HDL, LDLCALC, TRIG, CHOLHDL, LDLDIRECT in the last 72 hours.  No results found for: HGBA1C ------------------------------------------------------------------------------------------------------------------ No results for input(s): TSH, T4TOTAL, T3FREE, THYROIDAB in the last 72 hours.  Invalid input(s): FREET3 ------------------------------------------------------------------------------------------------------------------ No results for input(s): VITAMINB12, FOLATE, FERRITIN, TIBC, IRON, RETICCTPCT in the last 72 hours.  Coagulation profile Recent Labs  Lab 03/26/21 0430  INR 1.0   No results for input(s): DDIMER in the last 72 hours.  Cardiac Enzymes No results for input(s): CKTOTAL, CKMB, CKMBINDEX, TROPONINI in the last 168 hours.  ------------------------------------------------------------------------------------------------------------------ No results found for: BNP   Antibiotics: Anti-infectives (From admission, onward)    Start     Dose/Rate Route Frequency Ordered Stop   03/29/21 1000  cefTRIAXone (ROCEPHIN) 1 g in sodium chloride 0.9 % 100 mL IVPB        1 g 200 mL/hr over 30 Minutes Intravenous Every 24 hours 03/29/21 0855     03/27/21 2100  ceFAZolin (ANCEF) IVPB 2g/100 mL premix        2 g 200 mL/hr over 30 Minutes Intravenous Every 6 hours 03/27/21 1753 03/28/21 1359   03/27/21 0630  ceFAZolin (ANCEF) IVPB 2g/100 mL premix        2 g 200 mL/hr over 30 Minutes Intravenous On call to O.R. 03/27/21 0537 03/27/21 1455        Radiology Reports  DG Knee Complete 4 Views Left  Result Date: 03/29/2021 CLINICAL DATA:  Left knee pain after fall 3 days ago. EXAM: LEFT KNEE - COMPLETE 4+ VIEW COMPARISON:  None.  FINDINGS: No evidence of fracture, dislocation, or joint effusion. Mild tricompartmental peripheral spurring. There is medial and lateral tibiofemoral chondrocalcinosis. Small  quadriceps tendon enthesophyte. Soft tissues are unremarkable. IMPRESSION: 1. No acute fracture or subluxation of the left knee. 2. Mild tricompartmental osteoarthritis with chondrocalcinosis. Electronically Signed   By: Narda Rutherford M.D.   On: 03/29/2021 15:12      DVT prophylaxis: Aspirin  Code Status: Partial code  Family Communication: No family at bedside   Consultants: Orthopedics  Procedures:     Objective    Physical Examination:   General-appears in no acute distress Heart-S1-S2, regular, no murmur auscultated Lungs-clear to auscultation bilaterally, no wheezing or crackles auscultated Abdomen-soft, nontender, no organomegaly Extremities-right ankle in splint Neuro-alert, oriented x3, no focal deficit noted   Status is: Inpatient  Dispo: The patient is from: Home              Anticipated d/c is to: Skilled nursing facility              Anticipated d/c date is: 03/31/2021              Patient currently  stable for discharge  Barrier to discharge-awaiting bed at skilled nursing facility  COVID-19 Labs  No results for input(s): DDIMER, FERRITIN, LDH, CRP in the last 72 hours.  Lab Results  Component Value Date   SARSCOV2NAA NEGATIVE 03/26/2021   SARSCOV2NAA Not Detected 01/22/2019    Microbiology  Recent Results (from the past 240 hour(s))  Resp Panel by RT-PCR (Flu A&B, Covid) Nasopharyngeal Swab     Status: None   Collection Time: 03/26/21  3:34 AM   Specimen: Nasopharyngeal Swab; Nasopharyngeal(NP) swabs in vial transport medium  Result Value Ref Range Status   SARS Coronavirus 2 by RT PCR NEGATIVE NEGATIVE Final    Comment: (NOTE) SARS-CoV-2 target nucleic acids are NOT DETECTED.  The SARS-CoV-2 RNA is generally detectable in upper respiratory specimens during the  acute phase of infection. The lowest concentration of SARS-CoV-2 viral copies this assay can detect is 138 copies/mL. A negative result does not preclude SARS-Cov-2 infection and should not be used as the sole basis for treatment or other patient management decisions. A negative result may occur with  improper specimen collection/handling, submission of specimen other than nasopharyngeal swab, presence of viral mutation(s) within the areas targeted by this assay, and inadequate number of viral copies(<138 copies/mL). A negative result must be combined with clinical observations, patient history, and epidemiological information. The expected result is Negative.  Fact Sheet for Patients:  BloggerCourse.com  Fact Sheet for Healthcare Providers:  SeriousBroker.it  This test is no t yet approved or cleared by the [place] FDA and  has been authorized for detection and/or diagnosis of SARS-CoV-2 by FDA under an Emergency Use Authorization (EUA). This EUA will remain  in effect (meaning this test can be used) for the duration of the COVID-19 declaration under Section 564(b)(1) of the Act, 21 U.S.C.section 360bbb-3(b)(1), unless the authorization is terminated  or revoked sooner.       Influenza A by PCR NEGATIVE NEGATIVE Final   Influenza B by PCR NEGATIVE NEGATIVE Final    Comment: (NOTE) The Xpert Xpress SARS-CoV-2/FLU/RSV plus assay is intended as an aid in the diagnosis of influenza from Nasopharyngeal swab specimens and should not be used as a sole basis for treatment. Nasal washings and aspirates are unacceptable for Xpert Xpress SARS-CoV-2/FLU/RSV testing.  Fact Sheet for Patients: BloggerCourse.com  Fact Sheet for Healthcare Providers: SeriousBroker.it  This test is not yet approved or cleared by the [place] FDA and has been authorized for detection and/or  diagnosis of SARS-CoV-2 by FDA under an Emergency Use Authorization (EUA). This EUA will remain in effect (meaning this test can be used) for the duration of the COVID-19 declaration under Section 564(b)(1) of the Act, 21 U.S.C. section 360bbb-3(b)(1), unless the authorization is terminated or revoked.  Performed at [facility], [address], [place], [place] [address]   Urine Culture     Status: Abnormal   Collection Time: 03/28/21  4:54 PM   Specimen: Urine, Clean Catch  Result Value Ref Range Status   Specimen Description   Final    URINE, CLEAN CATCH Performed at [facility], [address], [place], [place] [address]    Special Requests   Final    NONE Performed at [facility], [address], [place], [place] [address]    Culture 20,000 COLONIES/mL ESCHERICHIA COLI (A)  Final   Report Status 03/30/2021 FINAL  Final   Organism ID, Bacteria ESCHERICHIA COLI (A)  Final      Susceptibility   Escherichia coli - MIC*    AMPICILLIN <=2 SENSITIVE Sensitive     CEFAZOLIN <=4 SENSITIVE Sensitive     CEFEPIME <=0.12 SENSITIVE Sensitive     CEFTRIAXONE <=0.25 SENSITIVE Sensitive     CIPROFLOXACIN <=0.25 SENSITIVE Sensitive     GENTAMICIN <=1 SENSITIVE Sensitive     IMIPENEM <=0.25 SENSITIVE Sensitive     NITROFURANTOIN 128 RESISTANT Resistant     TRIMETH/SULFA <=20 SENSITIVE Sensitive     AMPICILLIN/SULBACTAM <=2 SENSITIVE Sensitive     PIP/TAZO <=4 SENSITIVE Sensitive     * 20,000 COLONIES/mL ESCHERICHIA COLI         Michelangelo Rindfleisch S Tranell Wojtkiewicz   [facility] If 7PM-7AM, please contact night-coverage at [email or web address], Office  [phone]   03/30/2021, 4:06 PM  LOS: 4 days

## 2021-03-30 NOTE — Care Management Important Message (Signed)
Important Message  Patient Details IM Letter given to the Patient. Name: [name] [name] MRN: [id number] Date of Birth: Nov 03, 1930   Medicare Important Message Given:  Yes     Caren Macadam 03/30/2021, 12:01 PM

## 2021-03-30 NOTE — Progress Notes (Signed)
Subjective: 3 Days Post-Op s/p Procedure(s): OPEN REDUCTION INTERNAL FIXATION (ORIF) ANKLE FRACTURE   Patient is sitting up in bed. Falls asleep twice during exam but able to answer all questions. Patient states left knee pain has improved but continues to have intermittent moderate-severe right ankle pain, improved by pain medication.  Denies chest pain, SOB, Calf pain. No nausea/vomiting. No other complaints.  Objective:  PE: VITALS:   Vitals:   03/29/21 0539 03/29/21 2057 03/30/21 0618 03/30/21 1234  BP: (!) 153/44 (!) 138/51 (!) 133/57 (!) 151/52  Pulse: 63 66 66 67  Resp: 20 20 20 18   Temp: 97.6 F (36.4 C) 97.6 F (36.4 C) 97.9 F (36.6 C) 98.4 F (36.9 C)  TempSrc: Oral Oral Oral Oral  SpO2: 96% 98% 96% 99%  Weight:      Height:       General: sitting up in bed, in no acute distress Resp: no use of accessory musculature MSK; RLE  in splint. Able to flex and extend all toes. Sensation intact to all toes. Capillary refill intact. Ecchymosis at knee that is tender. Calf compressible. LLE - ecchymosis to medial left knee. Able to flex approximately 20 degrees while supine. Pain with Passive flexion beyond 40 degrees.   LABS  Results for orders placed or performed during the hospital encounter of 03/26/21 (from the past 24 hour(s))  CBC     Status: Abnormal   Collection Time: 03/30/21  5:03 AM  Result Value Ref Range   WBC 10.7 (H) 4.0 - 10.5 K/uL   RBC 2.95 (L) 3.87 - 5.11 MIL/uL   Hemoglobin 7.6 (L) 12.0 - 15.0 g/dL   HCT 05/30/21 (L) 44.3 - 15.4 %   MCV 83.4 80.0 - 100.0 fL   MCH 25.8 (L) 26.0 - 34.0 pg   MCHC 30.9 30.0 - 36.0 g/dL   RDW 00.8 (H) 67.6 - 19.5 %   Platelets 83 (L) 150 - 400 K/uL   nRBC 0.0 0.0 - 0.2 %  Basic metabolic panel     Status: Abnormal   Collection Time: 03/30/21  5:03 AM  Result Value Ref Range   Sodium 137 135 - 145 mmol/L   Potassium 3.5 3.5 - 5.1 mmol/L   Chloride 100 98 - 111 mmol/L   CO2 31 22 - 32 mmol/L   Glucose, Bld 101  (H) 70 - 99 mg/dL   BUN 36 (H) 8 - 23 mg/dL   Creatinine, Ser 05/30/21 (H) 0.44 - 1.00 mg/dL   Calcium 8.9 8.9 - 2.67 mg/dL   GFR, Estimated 39 (L) >60 mL/min   Anion gap 6 5 - 15    DG Knee Complete 4 Views Left  Result Date: 03/29/2021 CLINICAL DATA:  Left knee pain after fall 3 days ago. EXAM: LEFT KNEE - COMPLETE 4+ VIEW COMPARISON:  None. FINDINGS: No evidence of fracture, dislocation, or joint effusion. Mild tricompartmental peripheral spurring. There is medial and lateral tibiofemoral chondrocalcinosis. Small quadriceps tendon enthesophyte. Soft tissues are unremarkable. IMPRESSION: 1. No acute fracture or subluxation of the left knee. 2. Mild tricompartmental osteoarthritis with chondrocalcinosis. Electronically Signed   By: 05/29/2021 M.D.   On: 03/29/2021 15:12    Assessment/Plan: Right ankle fracture 3 Days Post-Op s/p Procedure(s): OPEN REDUCTION INTERNAL FIXATION (ORIF) ANKLE FRACTURE Weightbearing: NWB RLE Orthopedic device(s): Splint Showering: Hold for now  Left knee pain: - x-rays taken yesterday show mild tricompartmental arthritis without acute fracture, patient notes pain has improved today  VTE prophylaxis:  SCDs, Hbg 7.6 today, will start on 81 mg aspirin bid which she will discharge on Pain control: PRN pain medications Follow - up plan: 2 weeks in office Dispo: [facility] available tomorrow [facility], will likely d/c tomorrow   Contact information:   Weekdays 8-5 Janine Ores, [place] [phone] A fter hours and holidays please check [email or web address] for group call information for [facility]  Armida Sans 03/30/2021, 12:49 PM

## 2021-03-31 DIAGNOSIS — S82891A Other fracture of right lower leg, initial encounter for closed fracture: Secondary | ICD-10-CM | POA: Diagnosis not present

## 2021-03-31 DIAGNOSIS — S82851A Displaced trimalleolar fracture of right lower leg, initial encounter for closed fracture: Secondary | ICD-10-CM | POA: Diagnosis not present

## 2021-03-31 LAB — CBC
HCT: 26.1 % — ABNORMAL LOW (ref 36.0–46.0)
Hemoglobin: 8.1 g/dL — ABNORMAL LOW (ref 12.0–15.0)
MCH: 25.7 pg — ABNORMAL LOW (ref 26.0–34.0)
MCHC: 31 g/dL (ref 30.0–36.0)
MCV: 82.9 fL (ref 80.0–100.0)
Platelets: 97 10*3/uL — ABNORMAL LOW (ref 150–400)
RBC: 3.15 MIL/uL — ABNORMAL LOW (ref 3.87–5.11)
RDW: 19.9 % — ABNORMAL HIGH (ref 11.5–15.5)
WBC: 9.3 10*3/uL (ref 4.0–10.5)
nRBC: 0 % (ref 0.0–0.2)

## 2021-03-31 MED ORDER — ALPRAZOLAM 0.25 MG PO TABS
0.2500 mg | ORAL_TABLET | Freq: Every evening | ORAL | 0 refills | Status: DC | PRN
Start: 2021-03-31 — End: 2021-06-15

## 2021-03-31 MED ORDER — TRAMADOL HCL 50 MG PO TABS: 75.0000 mg | ORAL_TABLET | Freq: Four times a day (QID) | ORAL | 0 refills | Status: DC | PRN

## 2021-03-31 MED ORDER — ASPIRIN EC 81 MG PO TBEC: 81.0000 mg | DELAYED_RELEASE_TABLET | Freq: Two times a day (BID) | ORAL | 0 refills | Status: DC

## 2021-03-31 MED ORDER — CEPHALEXIN 500 MG PO CAPS: 500.0000 mg | ORAL_CAPSULE | Freq: Two times a day (BID) | ORAL | 0 refills | Status: AC

## 2021-03-31 NOTE — TOC Transition Note (Signed)
Transition of Care [facility]) - CM/SW Discharge Note   Patient Details  Name: [name] [name] MRN: [id number] Date of Birth: 1930/09/22  [facility] CM[facility]SW Contact:  Ida Rogue, LCSW Phone Number: 03/31/2021, 2:42 PM   Clinical Narrative:   Patient who is stable for discharge will transfer to [facility] for rehab today.  PTAR arranged.  Nursing, please call [phone], ask for nursing facility ofice, room 35, for report.  TOC sign off.    Final next level of care: Skilled Nursing Facility Barriers to Discharge: Barriers Resolved   Patient Goals and CMS Choice        Discharge Placement                       Discharge Plan and Services                                     Social Determinants of Health (SDOH) Interventions     Readmission Risk Interventions No flowsheet data found.

## 2021-03-31 NOTE — Anesthesia Postprocedure Evaluation (Signed)
Anesthesia Post Note  Patient: Khamiyah Grefe Muffley  Procedure(s) Performed: OPEN REDUCTION INTERNAL FIXATION (ORIF) ANKLE FRACTURE (Right: Ankle)     Patient location during evaluation: PACU Anesthesia Type: General Level of consciousness: awake and alert Pain management: pain level controlled Vital Signs Assessment: post-procedure vital signs reviewed and stable Respiratory status: spontaneous breathing, nonlabored ventilation and respiratory function stable Cardiovascular status: blood pressure returned to baseline and stable Postop Assessment: no apparent nausea or vomiting Anesthetic complications: no   No notable events documented.  Last Vitals:  Vitals:   03/30/21 2041 03/31/21 0520  BP: 128/60 (!) 141/79  Pulse: 80 63  Resp: 20 18  Temp: 37.1 C 36.5 C  SpO2: 94% 98%    Last Pain:  Vitals:   03/31/21 0520  TempSrc: Oral  PainSc:                  Lucretia Kern

## 2021-03-31 NOTE — H&P (Signed)
Physician Discharge Summary  [name] [name] STM:[id number] DOB: [birth year] DOA: 03/26/2021  PCP: Mahlon Gammon, MD  Admit date: 03/26/2021 Discharge date: 03/31/2021  Time spent: 60 minutes  Recommendations for Outpatient Follow-up:  Follow-up orthopedics in 2 weeks Continue Keflex 500 mg p.o. twice daily for 5 more days till 04/05/2021   Discharge Diagnoses:  Active Problems:   Ankle fracture   Discharge Condition: Stable  Diet recommendation: Heart healthy diet  Filed Weights   03/26/21 0119 03/26/21 1645  Weight: 78.9 kg 82.9 kg    History of present illness:  87 year old female with history of anxiety, hyperlipidemia, hypertension, hypothyroidism presented with ankle pain after fall.  X-ray of ankle showed trimalleolar fracture.  Orthopedics was consulted for ORIF.    Hospital Course:   Right trimalleolar fracture -S/p ORIF -Follow-up orthopedics in 2 weeks     Pancytopenia -Unclear etiology -Follow CBC in a.m. -Peripheral smear shows normocytic anemia and thrombocytopenia -We will need hematology consultation as outpatient   UTI -Patient complains of dysuria, urine culture obtained which is growing E. coli, 20,000 colonies per mL -IV ceftriaxone has been started. -Urine culture grew E. coli, sensitive to ceftriaxone and cefazolin. -We will switch to Keflex 500 mg p.o. twice daily for 5 more days.   Anemia of  chronic disease -Hemoglobin was 6.6, patient received 1 unit PRBC -Today hemoglobin is up to 7.6 -Patient is on ferrous sulfate supplementation   Hypertension/chronic diastolic CHF -Blood pressure is stable -Continue Demadex -Continue potassium supplementation   Anxiety -Continue home regimen   CKD stage III -Creatinine at baseline   Hypothyroidism -Continue Synthroid    Procedures: ORIF for right trimalleolar fracture  Consultations: Orthopedic surgery  Discharge Exam: Vitals:   03/30/21 2041 03/31/21 0520  BP: 128/60 (!) 141/79   Pulse: 80 63  Resp: 20 18  Temp: 98.7 F (37.1 C) 97.7 F (36.5 C)  SpO2: 94% 98%    General: Appears in no acute distress Cardiovascular: S1-S2, regular Respiratory: Clear to auscultation bilaterally  Discharge Instructions   Discharge Instructions     Diet - low sodium heart healthy   Complete by: As directed    Diet regular   Complete by: As directed    Increase activity slowly   Complete by: As directed    No wound care   Complete by: As directed       Allergies as of 03/31/2021   No Known Allergies      Medication List     STOP taking these medications    nitrofurantoin 50 MG capsule Commonly known as: MACRODANTIN       TAKE these medications    acetaminophen 500 MG tablet Commonly known as: TYLENOL Take 1,000 mg by mouth 3 (three) times daily as needed for mild pain.   ALPRAZolam 0.25 MG tablet Commonly known as: XANAX Take 1 tablet (0.25 mg total) by mouth at bedtime as needed for sleep or anxiety.   ARTIFICIAL TEARS OP Apply 1 drop to eye 3 (three) times daily as needed (dry eyes).   aspirin EC 81 MG tablet Take 1 tablet (81 mg total) by mouth 2 (two) times daily. For DVT prophylaxis for 30 days after surgery.   atorvastatin 10 MG tablet Commonly known as: LIPITOR Take 5 mg by mouth daily.   buPROPion 150 MG 12 hr tablet Commonly known as: WELLBUTRIN SR Take 1 tablet (150 mg total) by mouth 2 (two) times daily after a meal.   calcium citrate-vitamin D 315-200 MG-UNIT  tablet Commonly known as: CITRACAL+D Take 1 tablet by mouth 2 (two) times daily.   Camphor-Menthol-Methyl Sal 3.07-31-08 % Ptch Place 1 patch onto the skin daily. Apply one to lower back daily for back pain   cephALEXin 500 MG capsule Commonly known as: KEFLEX Take 1 capsule (500 mg total) by mouth 2 (two) times daily for 5 days.   docusate sodium 100 MG capsule Commonly known as: COLACE Take 100 mg by mouth 2 (two) times daily.   ferrous sulfate 325 (65 FE) MG EC  tablet Take 325 mg by mouth every Monday, Wednesday, and Friday. With a meal   fish oil-omega-3 fatty acids 1000 MG capsule Take 1 g by mouth daily.   gabapentin 100 MG capsule Commonly known as: NEURONTIN Take 100 mg by mouth 3 (three) times daily.   hydrocortisone cream 1 % Apply 1 application topically daily as needed for itching.   Klor-Con M20 20 MEQ tablet Generic drug: potassium chloride SA TAKE 1 TABLET BY MOUTH TWICE A DAY What changed: how much to take   levothyroxine 75 MCG tablet Commonly known as: SYNTHROID Take 75 mcg by mouth daily.   losartan 25 MG tablet Commonly known as: COZAAR Take 25 mg by mouth daily.   nystatin powder Commonly known as: MYCOSTATIN/NYSTOP Apply 1 application topically 2 (two) times daily as needed (yeast). What changed: Another medication with the same name was removed. Continue taking this medication, and follow the directions you see here.   omeprazole 40 MG capsule Commonly known as: PRILOSEC Take 40 mg by mouth daily.   polyethylene glycol 17 g packet Commonly known as: MIRALAX / GLYCOLAX Take 17 g by mouth as directed. On Monday and Thursday   sertraline 25 MG tablet Commonly known as: ZOLOFT Take 75 mg by mouth daily.   torsemide 20 MG tablet Commonly known as: DEMADEX Take 20 mg by mouth daily.   traMADol 50 MG tablet Commonly known as: ULTRAM Take 1.5 tablets (75 mg total) by mouth every 6 (six) hours as needed for moderate pain.       No Known Allergies  Follow-up Information     Teryl LucyLandau, Joshua, MD. Schedule an appointment as soon as possible for a visit in 2 week(s).   Specialty: Orthopedic Surgery Contact information: [address] [place] [address] [phone]                  The results of significant diagnostics from this hospitalization (including imaging, microbiology, ancillary and laboratory) are listed below for reference.    Significant Diagnostic Studies: CT  ABDOMEN PELVIS WO CONTRAST  Result Date: 03/26/2021 CLINICAL DATA:  Abdominal trauma.  Back pain after fall last night EXAM: CT ABDOMEN AND PELVIS WITHOUT CONTRAST TECHNIQUE: Multidetector CT imaging of the abdomen and pelvis was performed following the standard protocol without IV contrast. COMPARISON:  01/14/2018 FINDINGS: Lower chest: Atheromatous calcification of the aorta and coronaries. Small sliding hiatal hernia. Hepatobiliary: No focal liver abnormality.No evidence of biliary obstruction or stone. Pancreas: 2 cm cystic density projecting posteriorly from the pancreas, non progressed and non worrisome given patient age. No visible injury. Spleen: Unremarkable. Adrenals/Urinary Tract: Negative adrenals. No hydronephrosis or stone. Unremarkable bladder with moderate distension. Stomach/Bowel:  No obstruction. No visible injury or inflammation. Vascular/Lymphatic: Diffuse atheromatous calcification of the aorta, iliacs, and branch vessels. No acute vascular finding. No mass or adenopathy. Reproductive:No pathologic findings. Other: No ascites or pneumoperitoneum. Musculoskeletal: No acute finding. Prior T9, T11, L1, and L2  compression fractures with cement augmentation. Generalized spinal degeneration with lumbar dextroscoliosis. IMPRESSION: 1. No acute or interval finding. 2. Chronic findings are stable from 2019 and described above. Electronically Signed   By: Marnee Spring M.D.   On: 03/26/2021 07:39   DG Chest 1 View  Result Date: 03/26/2021 CLINICAL DATA:  Fall. EXAM: CHEST  1 VIEW COMPARISON:  Chest radiograph dated 01/14/2018 FINDINGS: Evaluation is limited due to patient's positioning and superimposition of the mandible over the right upper lobe. No focal consolidation, pleural effusion pneumothorax. Stable cardiac silhouette. No acute osseous pathology. Osteopenia with degenerative changes of the spine. Multilevel thoracic vertebroplasty. IMPRESSION: No acute cardiopulmonary process.  Electronically Signed   By: Elgie Collard M.D.   On: 03/26/2021 02:45   DG Ankle Complete Right  Result Date: 03/26/2021 CLINICAL DATA:  Fall and right lower extremity pain. EXAM: RIGHT KNEE - COMPLETE 4+ VIEW; RIGHT ANKLE - COMPLETE 3+ VIEW COMPARISON:  None. FINDINGS: There is a mildly displaced fracture of the medial malleolus. Minimally displaced fracture of the lateral malleolus. There is a nondisplaced fracture of the posterior malleolus. No other acute fracture. The bones are osteopenic. There is no dislocation. There is a total right knee arthroplasty. The arthroplasty components appear intact and in anatomic alignment. There is diffuse subcutaneous edema. IMPRESSION: 1. Trimalleolar fracture.  No dislocation. 2. Right knee arthroplasty appears intact. Electronically Signed   By: Elgie Collard M.D.   On: 03/26/2021 02:44   CT HEAD WO CONTRAST ( )  Result Date: 03/26/2021 CLINICAL DATA:  Headache, new or worsening (Age >= 50y); Neck trauma (Age >= 65y) Neck trauma (Age >= 65y) EXAM: CT HEAD WITHOUT CONTRAST CT CERVICAL SPINE WITHOUT CONTRAST TECHNIQUE: Multidetector CT imaging of the head and cervical spine was performed following the standard protocol without intravenous contrast. Multiplanar CT image reconstructions of the cervical spine were also generated. COMPARISON:  January 14, 2018. FINDINGS: CT HEAD FINDINGS Brain: No evidence of acute infarction, hemorrhage, hydrocephalus, extra-axial collection or mass lesion/mass effect. Mild to moderate for age patchy and confluent white matter hypoattenuation, nonspecific but compatible with chronic microvascular ischemic disease. Mild for age atrophy with ex vacuo ventricular dilation. Partially empty sella. Vascular: Calcific intracranial atherosclerosis. No hyperdense vessel identified. Skull: No acute fracture. Sinuses/Orbits: Clear sinuses.  Unremarkable orbits. Other: No mastoid effusions. CT CERVICAL SPINE FINDINGS Alignment: Similar alignment.  Similar mild anterolisthesis of C7 on T1, likely degenerative given stability and facet arthropathy at this level. Skull base and vertebrae: No evidence of acute fracture. Similar vertebral body heights. Osteopenia. Soft tissues and spinal canal: No prevertebral fluid or swelling. No visible canal hematoma. Disc levels: Similar moderate degenerative change at C5-C6 and C6-C7. Similar likely moderate foraminal stenosis at multiple levels due to facet/uncovertebral hypertrophy. Upper chest: Biapical pleuroparenchymal scarring and mild dependent probable atelectasis. Otherwise, lung apices are clear. Other: Calcific atherosclerosis. IMPRESSION: CT Head 1. No evidence of acute intracranial abnormality. 2. Mild-to-moderate chronic microvascular ischemic disease and atrophy. CT Cervical Spine 1. No evidence of acute fracture or traumatic malalignment. 2. Similar moderate degenerative change at C5-C6 and C6-C7 and multilevel facet arthropathy with likely moderate multilevel foraminal stenosis. Electronically Signed   By: Feliberto Harts M.D.   On: 03/26/2021 07:37   CT Cervical Spine Wo Contrast  Result Date: 03/26/2021 CLINICAL DATA:  Headache, new or worsening (Age >= 50y); Neck trauma (Age >= 65y) Neck trauma (Age >= 65y) EXAM: CT HEAD WITHOUT CONTRAST CT CERVICAL SPINE WITHOUT CONTRAST TECHNIQUE: Multidetector CT imaging of the  head and cervical spine was performed following the standard protocol without intravenous contrast. Multiplanar CT image reconstructions of the cervical spine were also generated. COMPARISON:  January 14, 2018. FINDINGS: CT HEAD FINDINGS Brain: No evidence of acute infarction, hemorrhage, hydrocephalus, extra-axial collection or mass lesion/mass effect. Mild to moderate for age patchy and confluent white matter hypoattenuation, nonspecific but compatible with chronic microvascular ischemic disease. Mild for age atrophy with ex vacuo ventricular dilation. Partially empty sella. Vascular:  Calcific intracranial atherosclerosis. No hyperdense vessel identified. Skull: No acute fracture. Sinuses/Orbits: Clear sinuses.  Unremarkable orbits. Other: No mastoid effusions. CT CERVICAL SPINE FINDINGS Alignment: Similar alignment. Similar mild anterolisthesis of C7 on T1, likely degenerative given stability and facet arthropathy at this level. Skull base and vertebrae: No evidence of acute fracture. Similar vertebral body heights. Osteopenia. Soft tissues and spinal canal: No prevertebral fluid or swelling. No visible canal hematoma. Disc levels: Similar moderate degenerative change at C5-C6 and C6-C7. Similar likely moderate foraminal stenosis at multiple levels due to facet/uncovertebral hypertrophy. Upper chest: Biapical pleuroparenchymal scarring and mild dependent probable atelectasis. Otherwise, lung apices are clear. Other: Calcific atherosclerosis. IMPRESSION: CT Head 1. No evidence of acute intracranial abnormality. 2. Mild-to-moderate chronic microvascular ischemic disease and atrophy. CT Cervical Spine 1. No evidence of acute fracture or traumatic malalignment. 2. Similar moderate degenerative change at C5-C6 and C6-C7 and multilevel facet arthropathy with likely moderate multilevel foraminal stenosis. Electronically Signed   By: Feliberto Harts M.D.   On: 03/26/2021 07:37   DG Knee Complete 4 Views Left  Result Date: 03/29/2021 CLINICAL DATA:  Left knee pain after fall 3 days ago. EXAM: LEFT KNEE - COMPLETE 4+ VIEW COMPARISON:  None. FINDINGS: No evidence of fracture, dislocation, or joint effusion. Mild tricompartmental peripheral spurring. There is medial and lateral tibiofemoral chondrocalcinosis. Small quadriceps tendon enthesophyte. Soft tissues are unremarkable. IMPRESSION: 1. No acute fracture or subluxation of the left knee. 2. Mild tricompartmental osteoarthritis with chondrocalcinosis. Electronically Signed   By: Narda Rutherford M.D.   On: 03/29/2021 15:12   DG Knee Complete 4  Views Right  Result Date: 03/26/2021 CLINICAL DATA:  Fall and right lower extremity pain. EXAM: RIGHT KNEE - COMPLETE 4+ VIEW; RIGHT ANKLE - COMPLETE 3+ VIEW COMPARISON:  None. FINDINGS: There is a mildly displaced fracture of the medial malleolus. Minimally displaced fracture of the lateral malleolus. There is a nondisplaced fracture of the posterior malleolus. No other acute fracture. The bones are osteopenic. There is no dislocation. There is a total right knee arthroplasty. The arthroplasty components appear intact and in anatomic alignment. There is diffuse subcutaneous edema. IMPRESSION: 1. Trimalleolar fracture.  No dislocation. 2. Right knee arthroplasty appears intact. Electronically Signed   By: Elgie Collard M.D.   On: 03/26/2021 02:44   DG Ankle Right Port  Result Date: 03/26/2021 CLINICAL DATA:  Postreduction EXAM: PORTABLE RIGHT ANKLE - 2 VIEW COMPARISON:  Earlier today FINDINGS: Acute trimalleolar fracture with mild posterior displacement at the distal fibula. No change in alignment when compared to prior. Generalized osteopenia soft tissue swelling. IMPRESSION: Stable ankle fracture alignment after splinting. Electronically Signed   By: Marnee Spring M.D.   On: 03/26/2021 05:46   DG Hip Unilat With Pelvis 2-3 Views Right  Result Date: 03/26/2021 CLINICAL DATA:  Fall and right hip pain. EXAM: DG HIP (WITH OR WITHOUT PELVIS) 2-3V RIGHT COMPARISON:  Right hip radiograph dated 10/16/2020 FINDINGS: There is no acute fracture or dislocation. The bones are osteopenic. Mild bilateral hip arthritic changes. The  soft tissues are unremarkable. IMPRESSION: No acute fracture or dislocation. Electronically Signed   By: Elgie Collard M.D.   On: 03/26/2021 02:42    Microbiology: Recent Results (from the past 240 hour(s))  Resp Panel by RT-PCR (Flu A&B, Covid) Nasopharyngeal Swab     Status: None   Collection Time: 03/26/21  3:34 AM   Specimen: Nasopharyngeal Swab; Nasopharyngeal(NP) swabs in  vial transport medium  Result Value Ref Range Status   SARS Coronavirus 2 by RT PCR NEGATIVE NEGATIVE Final    Comment: (NOTE) SARS-CoV-2 target nucleic acids are NOT DETECTED.  The SARS-CoV-2 RNA is generally detectable in upper respiratory specimens during the acute phase of infection. The lowest concentration of SARS-CoV-2 viral copies this assay can detect is 138 copies/mL. A negative result does not preclude SARS-Cov-2 infection and should not be used as the sole basis for treatment or other patient management decisions. A negative result may occur with  improper specimen collection/handling, submission of specimen other than nasopharyngeal swab, presence of viral mutation(s) within the areas targeted by this assay, and inadequate number of viral copies(<138 copies/mL). A negative result must be combined with clinical observations, patient history, and epidemiological information. The expected result is Negative.  Fact Sheet for Patients:  BloggerCourse.com  Fact Sheet for Healthcare Providers:  SeriousBroker.it  This test is no t yet approved or cleared by the [place] FDA and  has been authorized for detection and/or diagnosis of SARS-CoV-2 by FDA under an Emergency Use Authorization (EUA). This EUA will remain  in effect (meaning this test can be used) for the duration of the COVID-19 declaration under Section 564(b)(1) of the Act, 21 U.S.C.section 360bbb-3(b)(1), unless the authorization is terminated  or revoked sooner.       Influenza A by PCR NEGATIVE NEGATIVE Final   Influenza B by PCR NEGATIVE NEGATIVE Final    Comment: (NOTE) The Xpert Xpress SARS-CoV-2/FLU/RSV plus assay is intended as an aid in the diagnosis of influenza from Nasopharyngeal swab specimens and should not be used as a sole basis for treatment. Nasal washings and aspirates are unacceptable for Xpert Xpress SARS-CoV-2/FLU/RSV testing.  Fact  Sheet for Patients: BloggerCourse.com  Fact Sheet for Healthcare Providers: SeriousBroker.it  This test is not yet approved or cleared by the [place] FDA and has been authorized for detection and/or diagnosis of SARS-CoV-2 by FDA under an Emergency Use Authorization (EUA). This EUA will remain in effect (meaning this test can be used) for the duration of the COVID-19 declaration under Section 564(b)(1) of the Act, 21 U.S.C. section 360bbb-3(b)(1), unless the authorization is terminated or revoked.  Performed at [facility], [address], [place], [place] [address]   Urine Culture     Status: Abnormal   Collection Time: 03/28/21  4:54 PM   Specimen: Urine, Clean Catch  Result Value Ref Range Status   Specimen Description   Final    URINE, CLEAN CATCH Performed at [facility], [address], [place], [place] [address]    Special Requests   Final    NONE Performed at [facility], [address] [place], [place] [address]    Culture 20,000 COLONIES/mL ESCHERICHIA COLI (A)  Final   Report Status 03/30/2021 FINAL  Final   Organism ID, Bacteria ESCHERICHIA COLI (A)  Final      Susceptibility   Escherichia coli - MIC*    AMPICILLIN <=2 SENSITIVE Sensitive     CEFAZOLIN <=4 SENSITIVE Sensitive  CEFEPIME <=0.12 SENSITIVE Sensitive     CEFTRIAXONE <=0.25 SENSITIVE Sensitive     CIPROFLOXACIN <=0.25 SENSITIVE Sensitive     GENTAMICIN <=1 SENSITIVE Sensitive     IMIPENEM <=0.25 SENSITIVE Sensitive     NITROFURANTOIN 128 RESISTANT Resistant     TRIMETH/SULFA <=20 SENSITIVE Sensitive     AMPICILLIN/SULBACTAM <=2 SENSITIVE Sensitive     PIP/TAZO <=4 SENSITIVE Sensitive     * 20,000 COLONIES/mL ESCHERICHIA COLI  SARS CORONAVIRUS 2 (TAT 6-24 HRS) Nasopharyngeal Nasopharyngeal Swab     Status: None   Collection Time: 03/30/21  3:27 PM   Specimen: Nasopharyngeal Swab   Result Value Ref Range Status   SARS Coronavirus 2 NEGATIVE NEGATIVE Final    Comment: (NOTE) SARS-CoV-2 target nucleic acids are NOT DETECTED.  The SARS-CoV-2 RNA is generally detectable in upper and lower respiratory specimens during the acute phase of infection. Negative results do not preclude SARS-CoV-2 infection, do not rule out co-infections with other pathogens, and should not be used as the sole basis for treatment or other patient management decisions. Negative results must be combined with clinical observations, patient history, and epidemiological information. The expected result is Negative.  Fact Sheet for Patients: HairSlick.no  Fact Sheet for Healthcare Providers: quierodirigir.com  This test is not yet approved or cleared by the [place] FDA and  has been authorized for detection and/or diagnosis of SARS-CoV-2 by FDA under an Emergency Use Authorization (EUA). This EUA will remain  in effect (meaning this test can be used) for the duration of the COVID-19 declaration under Se ction 564(b)(1) of the Act, 21 U.S.C. section 360bbb-3(b)(1), unless the authorization is terminated or revoked sooner.  Performed at [facility], [address], [place], [place] [address]      Labs: Basic Metabolic Panel: Recent Labs  Lab 03/26/21 0334 03/27/21 0543 03/28/21 0547 03/30/21 0503  NA 136 141 137 137  K 4.3 3.6 4.0 3.5  CL 104 104 104 100  CO2 23 27 28 31   GLUCOSE 110* 112* 117* 101*  BUN 32* 31* 22 36*  CREATININE 1.36* 1.29* 1.00 1.31*  CALCIUM 9.8 9.6 9.2 8.9   Liver Function Tests: Recent Labs  Lab 03/27/21 0543  AST 21  ALT 16  ALKPHOS 69  BILITOT 0.4  PROT 7.4  ALBUMIN 3.7   No results for input(s): LIPASE, AMYLASE in the last 168 hours. No results for input(s): AMMONIA in the last 168 hours. CBC: Recent Labs  Lab 03/26/21 0334 03/27/21 0543 03/28/21 0830 03/28/21 1532  03/28/21 2248 03/30/21 0503 03/31/21 0444  WBC 11.0* 9.4 11.8* 13.5*  --  10.7* 9.3  NEUTROABS 8.6*  --   --   --   --   --   --   HGB 10.0* 8.5* 6.6* 6.6* 7.8* 7.6* 8.1*  HCT 33.7* 27.5* 21.9* 21.3* 25.8* 24.6* 26.1*  MCV 91.6 87.3 88.3 89.1  --  83.4 82.9  PLT 46* 95* 89* 92*  --  83* 97*       Signed:  05/31/21 MD.  [facility] 03/31/2021, 12:05 PM

## 2021-04-01 ENCOUNTER — Encounter: Payer: Self-pay | Admitting: Orthopedic Surgery

## 2021-04-01 ENCOUNTER — Other Ambulatory Visit: Payer: Self-pay | Admitting: *Deleted

## 2021-04-01 ENCOUNTER — Non-Acute Institutional Stay (SKILLED_NURSING_FACILITY): Payer: Medicare Other | Admitting: Orthopedic Surgery

## 2021-04-01 DIAGNOSIS — S82851D Displaced trimalleolar fracture of right lower leg, subsequent encounter for closed fracture with routine healing: Secondary | ICD-10-CM

## 2021-04-01 DIAGNOSIS — S82851A Displaced trimalleolar fracture of right lower leg, initial encounter for closed fracture: Principal | ICD-10-CM

## 2021-04-01 DIAGNOSIS — E039 Hypothyroidism, unspecified: Secondary | ICD-10-CM

## 2021-04-01 DIAGNOSIS — D61818 Other pancytopenia: Secondary | ICD-10-CM

## 2021-04-01 DIAGNOSIS — N1832 Chronic kidney disease, stage 3b: Secondary | ICD-10-CM | POA: Diagnosis not present

## 2021-04-01 DIAGNOSIS — D638 Anemia in other chronic diseases classified elsewhere: Secondary | ICD-10-CM

## 2021-04-01 DIAGNOSIS — K5909 Other constipation: Secondary | ICD-10-CM | POA: Diagnosis not present

## 2021-04-01 DIAGNOSIS — I10 Essential (primary) hypertension: Secondary | ICD-10-CM

## 2021-04-01 DIAGNOSIS — N3000 Acute cystitis without hematuria: Secondary | ICD-10-CM | POA: Diagnosis not present

## 2021-04-01 DIAGNOSIS — F418 Other specified anxiety disorders: Secondary | ICD-10-CM

## 2021-04-01 MED ORDER — TRAMADOL HCL 50 MG PO TABS: 75.0000 mg | ORAL_TABLET | Freq: Four times a day (QID) | ORAL | 0 refills | Status: DC | PRN

## 2021-04-01 NOTE — Telephone Encounter (Signed)
Received refill Request from [facility].  Pended Rx and sent to [name] for approval.

## 2021-04-01 NOTE — Progress Notes (Signed)
Location:   [place] Room Number: [place] of Service:  SNF [phone]) Provider:  Windell Moulding, NP   Virgie Dad, MD  Patient Care Team: Virgie Dad, MD as PCP - General (Internal Medicine) Mast, Man X, NP as Nurse Practitioner (Internal Medicine)  Extended Emergency Contact Information Primary Emergency Contact: Carie,[name] Address: [address]          [place], [place] [address] [place] Phone: [phone] Mobile Phone: [phone] Relation: Son Secondary Emergency Contact: Balch, Kathi [place] Phone: [phone] Mobile Phone: [phone] Relation: Other  Code Status:  DNR Goals of care: Advanced Directive information Advanced Directives 04/01/2021  Does Patient Have a Medical Advance Directive? Yes  Type of Paramedic of [place];Out of facility DNR (pink MOST or yellow form)  Does patient want to make changes to medical advance directive? No - Patient declined  Copy of [place] in Chart? Yes - validated most recent copy scanned in chart (See row information)  Pre-existing out of facility DNR order (yellow form or pink MOST form) Yellow form placed in chart (order not valid for inpatient use)     Chief Complaint  Patient presents with   Hospitalization Follow-up    Patient following up after hospitalization    HPI:  Pt is a 87 y.o. female seen today for a hospital f/u s/p admission from [facility] 09/01- 09/07.   Prior to incident, she lived on the assisted living unit at [facility].   09/01 she was getting out of bed and fell over assistive device. She fell forward and landed on her stomach. No injury to head. She was unable to get up due to right ankle pain and was sent to ED for further evaluation. Xray right knee and ankle revealed right trimalleolar fracture. 09/02 she underwent open reduction internal fixation of the right trimalleolar  ankle fracture without fixation of posterior lip by Dr. Mardelle Matte. She tolerated procedure well, EBL 75cc. Hemoglobin dropped after surgrery and she received 1 unit PRBC, improved to 7.6.  During her hospital course she was noted to have pancytopenia, peripheral smear revealed normocytic anemia and thrombocytopenia, hematology consult advised. In addition she was treated for UTI, culture revealed E.Coli, IV ceftriaxone given, remains on Keflex at this time. She was discharged to skilled nursing unit at [facility] for additional PT/OT. RLE NWB at this time. She remains 2 person assist with transfers.   Today, she is very tired during our encounter. She denies pain to right lower leg. Upset she has not had a bowel movement recently.     Past Medical History:  Diagnosis Date   Anemia    Anxiety    Chronotropic incompetence 12/2012    potentially medication related; noted on CPET    DDD (degenerative disc disease)    Eczema    GERD (gastroesophageal reflux disease)    H/O hiatal hernia    Hx: UTI (urinary tract infection)    Hyperlipidemia    Hypertension    Hypothyroidism    Osteopenia    Septicemia ([place]) 2002   following UTI   Vertigo, benign positional    Past Surgical History:  Procedure Laterality Date   BREAST SURGERY     left biopsy   CATARACT EXTRACTION Right    2 weeks ago   CPET / MET - PFTS     Consistent with chronotropic incompetence; See attached  report in the results section   DILATION AND CURETTAGE OF UTERUS     x 2   DOPPLER ECHOCARDIOGRAPHY  01/10/2013   Normal LV size and function. Normal EF. Air sclerosis but no stenosis   ESOPHAGOGASTRODUODENOSCOPY  05/04/2012   Procedure: ESOPHAGOGASTRODUODENOSCOPY (EGD);  Surgeon: Inda Castle, MD;  Location: [facility];  Service: Endoscopy;  Laterality: N/A;   EYE SURGERY     cataract extraction with ILO  ? eye   IR KYPHO EA ADDL LEVEL THORACIC OR LUMBAR  10/28/2016   IR KYPHO LUMBAR INC FX REDUCE BONE BX UNI/BIL  CANNULATION INC/IMAGING  10/28/2016   IR KYPHO THORACIC WITH BONE BIOPSY  12/24/2016   IR RADIOLOGIST EVAL & MGMT  11/11/2016   KNEE ARTHROSCOPY  2011   right   TONSILLECTOMY     TOTAL KNEE ARTHROPLASTY  04/24/2012   Procedure: TOTAL KNEE ARTHROPLASTY;  Surgeon: Gearlean Alf, MD;  Location: [facility];  Service: Orthopedics;  Laterality: Right;   TRANSTHORACIC ECHOCARDIOGRAM  02/2018   Ordered by PCP for "congestive heart failure " -EF 60 M 65%.  GR 1 DD.  No R WMA--- > ESSENTIALLY NORMAL    No Known Allergies  Allergies as of 04/01/2021   No Known Allergies      Medication List        Accurate as of April 01, 2021 10:49 AM. If you have any questions, ask your nurse or doctor.          acetaminophen 500 MG tablet Commonly known as: TYLENOL Take 1,000 mg by mouth 3 (three) times daily as needed for mild pain.   ALPRAZolam 0.25 MG tablet Commonly known as: XANAX Take 1 tablet (0.25 mg total) by mouth at bedtime as needed for sleep or anxiety.   ARTIFICIAL TEARS OP Apply 1 drop to eye 3 (three) times daily as needed (dry eyes).   aspirin EC 81 MG tablet Take 1 tablet (81 mg total) by mouth 2 (two) times daily. For DVT prophylaxis for 30 days after surgery.   atorvastatin 10 MG tablet Commonly known as: LIPITOR Take 5 mg by mouth daily.   buPROPion 150 MG 12 hr tablet Commonly known as: WELLBUTRIN SR Take 1 tablet (150 mg total) by mouth 2 (two) times daily after a meal.   calcium citrate-vitamin D 315-200 MG-UNIT tablet Commonly known as: CITRACAL+D Take 1 tablet by mouth 2 (two) times daily.   Camphor-Menthol-Methyl Sal 3.07-31-08 % Ptch Place 1 patch onto the skin daily. Apply one to lower back daily for back pain   cephALEXin 500 MG capsule Commonly known as: KEFLEX Take 1 capsule (500 mg total) by mouth 2 (two) times daily for 5 days.   docusate sodium 100 MG capsule Commonly known as: COLACE Take 100 mg by mouth 2 (two) times daily.   ferrous sulfate 325  (65 FE) MG EC tablet Take 325 mg by mouth every Monday, Wednesday, and Friday. With a meal   fish oil-omega-3 fatty acids 1000 MG capsule Take 1 g by mouth daily.   gabapentin 100 MG capsule Commonly known as: NEURONTIN Take 100 mg by mouth 3 (three) times daily.   hydrocortisone cream 1 % Apply 1 application topically daily as needed for itching.   levothyroxine 75 MCG tablet Commonly known as: SYNTHROID Take 75 mcg by mouth daily.   losartan 25 MG tablet Commonly known as: COZAAR Take 25 mg by mouth daily.   nystatin powder Commonly known as: MYCOSTATIN/NYSTOP Apply 1 application topically  2 (two) times daily as needed (yeast).   omeprazole 40 MG capsule Commonly known as: PRILOSEC Take 40 mg by mouth daily.   polyethylene glycol 17 g packet Commonly known as: MIRALAX / GLYCOLAX Take 17 g by mouth as directed. On Monday and Thursday   potassium chloride SA 20 MEQ tablet Commonly known as: KLOR-CON Take 20 mEq by mouth 2 (two) times daily. What changed: Another medication with the same name was removed. Continue taking this medication, and follow the directions you see here.   sertraline 25 MG tablet Commonly known as: ZOLOFT Take 75 mg by mouth daily.   torsemide 20 MG tablet Commonly known as: DEMADEX Take 20 mg by mouth daily.   traMADol 50 MG tablet Commonly known as: ULTRAM Take 1.5 tablets (75 mg total) by mouth every 6 (six) hours as needed for moderate pain.   zinc oxide 20 % ointment Apply 1 application topically as needed for irritation.        Review of Systems  Constitutional:  Negative for activity change, appetite change, fatigue and fever.  HENT:  Negative for dental problem, hearing loss and trouble swallowing.   Eyes:  Negative for photophobia and visual disturbance.  Respiratory:  Negative for cough, shortness of breath and wheezing.   Cardiovascular:  Negative for chest pain and leg swelling.  Gastrointestinal:  Positive for abdominal  distention and constipation. Negative for abdominal pain, blood in stool, diarrhea and nausea.  Genitourinary:  Negative for dysuria, frequency and hematuria.  Musculoskeletal:  Positive for arthralgias, gait problem, joint swelling and myalgias.       Right ankle fracture  Skin:        Surgical incision  Neurological:  Positive for weakness. Negative for dizziness and light-headedness.  Psychiatric/Behavioral:  Negative for dysphoric mood and sleep disturbance. The patient is not nervous/anxious.    Immunization History  Administered Date(s) Administered   Influenza, High Dose Seasonal PF 04/26/2016, 03/22/2017, 05/08/2019   Influenza,inj,Quad PF,6+ Mos 04/27/2018   Influenza-Unspecified 04/22/2011, 04/04/2012, 05/03/2013, 05/04/2013, 04/17/2014, 04/19/2015, 04/26/2016, 04/22/2017   Moderna SARS-COV2 Booster Vaccination 12/31/2020   Moderna Sars-Covid-2 Vaccination 07/30/2019, 08/27/2019   Pneumococcal Polysaccharide-23 08/26/2003, 09/06/2011, 05/15/2019   Pneumococcal-Unspecified 12/20/2013   Td 09/26/2006   Tdap 05/15/2019   Zoster Recombinat (Shingrix) 11/21/2017, 04/08/2018   Zoster, Live 10/25/2007   Pertinent  Health Maintenance Due  Topic Date Due   PNA vac Low Risk Adult (2 of 2 - PCV13) 05/14/2020   INFLUENZA VACCINE  02/23/2021   DEXA SCAN  Completed   Fall Risk  10/18/2018 08/23/2018 05/09/2018 05/01/2018 04/10/2018  Falls in the past year? 0 0 Yes Yes No  Comment - - - [facility]/2019 [facility] -  Number falls in past yr: 0 0 1 1 -  Injury with Fall? 0 0 No No -  Risk for fall due to : - - - Impaired balance/gait -  Follow up - - - Falls prevention discussed -   Functional Status Survey:    Vitals:   04/01/21 1041  BP: (!) 145/62  Pulse: 69  Resp: 14  Temp: 97.7 F (36.5 C)  SpO2: 97%  Weight: 182 lb 1.9 oz (82.6 kg)  Height: '5\' 4"'  (1.626 m)   Body mass index is 31.26 kg/m. Physical Exam Vitals reviewed.  Constitutional:       General: She is not in acute distress. HENT:     Head: Normocephalic.     Right Ear: There is no impacted  cerumen.     Left Ear: There is no impacted cerumen.     Nose: Nose normal.     Mouth/Throat:     Mouth: Mucous membranes are moist.  Eyes:     General:        Right eye: No discharge.        Left eye: No discharge.  Neck:     Vascular: No carotid bruit.  Cardiovascular:     Rate and Rhythm: Normal rate and regular rhythm.     Pulses: Normal pulses.     Heart sounds: Normal heart sounds. No murmur heard. Pulmonary:     Effort: Pulmonary effort is normal. No respiratory distress.     Breath sounds: Normal breath sounds. No wheezing.  Abdominal:     General: Bowel sounds are normal. There is distension.     Palpations: There is no mass.     Tenderness: There is no abdominal tenderness.     Hernia: No hernia is present.  Musculoskeletal:     Cervical back: Normal range of motion.     Right lower leg: Edema present.     Left lower leg: No edema.     Comments: Right lower leg in cast. Upper right knee with bruising. Toes with full sensation and movement, cap refill < 3 sec.   Lymphadenopathy:     Cervical: No cervical adenopathy.  Skin:    General: Skin is warm and dry.  Neurological:     General: No focal deficit present.     Mental Status: She is alert and oriented to person, place, and time.     Motor: Weakness present.     Gait: Gait abnormal.  Psychiatric:        Mood and Affect: Mood normal.        Behavior: Behavior normal.    Labs reviewed: Recent Labs    03/27/21 0543 03/28/21 0547 03/30/21 0503  NA 141 137 137  K 3.6 4.0 3.5  CL 104 104 100  CO2 '27 28 31  ' GLUCOSE 112* 117* 101*  BUN 31* 22 36*  CREATININE 1.29* 1.00 1.31*  CALCIUM 9.6 9.2 8.9   Recent Labs    04/10/20 0000 06/05/20 0000 03/27/21 0543  AST '14 20 21  ' ALT '8 14 16  ' ALKPHOS 85 94 69  BILITOT  --   --  0.4  PROT  --   --  7.4  ALBUMIN 4.0 3.9 3.7   Recent Labs     04/10/20 0000 06/05/20 0000 10/16/20 1804 03/26/21 0334 03/27/21 0543 03/28/21 1532 03/28/21 2248 03/30/21 0503 03/31/21 0444  WBC 3.4 4.4   < > 11.0*   < > 13.5*  --  10.7* 9.3  NEUTROABS 1,510.00 2,213.00  --  8.6*  --   --   --   --   --   HGB 10.0* 9.7*   < > 10.0*   < > 6.6* 7.8* 7.6* 8.1*  HCT 32* 31*   < > 33.7*   < > 21.3* 25.8* 24.6* 26.1*  MCV  --   --    < > 91.6   < > 89.1  --  83.4 82.9  PLT 92* 77*   < > 46*   < > 92*  --  83* 97*   < > = values in this interval not displayed.   Lab Results  Component Value Date   TSH 4.69 06/05/2020   No results found for: HGBA1C Lab Results  Component  Value Date   CHOL 154 04/10/2020   HDL 47 04/10/2020   LDLCALC 87 04/10/2020   TRIG 108 04/10/2020   CHOLHDL 3.8 11/23/2018    Significant Diagnostic Results in last 30 days:  CT ABDOMEN PELVIS WO CONTRAST  Result Date: 03/26/2021 CLINICAL DATA:  Abdominal trauma.  Back pain after fall last night EXAM: CT ABDOMEN AND PELVIS WITHOUT CONTRAST TECHNIQUE: Multidetector CT imaging of the abdomen and pelvis was performed following the standard protocol without IV contrast. COMPARISON:  01/14/2018 FINDINGS: Lower chest: Atheromatous calcification of the aorta and coronaries. Small sliding hiatal hernia. Hepatobiliary: No focal liver abnormality.No evidence of biliary obstruction or stone. Pancreas: 2 cm cystic density projecting posteriorly from the pancreas, non progressed and non worrisome given patient age. No visible injury. Spleen: Unremarkable. Adrenals/Urinary Tract: Negative adrenals. No hydronephrosis or stone. Unremarkable bladder with moderate distension. Stomach/Bowel:  No obstruction. No visible injury or inflammation. Vascular/Lymphatic: Diffuse atheromatous calcification of the aorta, iliacs, and branch vessels. No acute vascular finding. No mass or adenopathy. Reproductive:No pathologic findings. Other: No ascites or pneumoperitoneum. Musculoskeletal: No acute finding. Prior  T9, T11, L1, and L2 compression fractures with cement augmentation. Generalized spinal degeneration with lumbar dextroscoliosis. IMPRESSION: 1. No acute or interval finding. 2. Chronic findings are stable from 2019 and described above. Electronically Signed   By: Monte Fantasia M.D.   On: 03/26/2021 07:39   DG Chest 1 View  Result Date: 03/26/2021 CLINICAL DATA:  Fall. EXAM: CHEST  1 VIEW COMPARISON:  Chest radiograph dated 01/14/2018 FINDINGS: Evaluation is limited due to patient's positioning and superimposition of the mandible over the right upper lobe. No focal consolidation, pleural effusion pneumothorax. Stable cardiac silhouette. No acute osseous pathology. Osteopenia with degenerative changes of the spine. Multilevel thoracic vertebroplasty. IMPRESSION: No acute cardiopulmonary process. Electronically Signed   By: Anner Crete M.D.   On: 03/26/2021 02:45   DG Ankle Complete Right  Result Date: 03/26/2021 CLINICAL DATA:  Fall and right lower extremity pain. EXAM: RIGHT KNEE - COMPLETE 4+ VIEW; RIGHT ANKLE - COMPLETE 3+ VIEW COMPARISON:  None. FINDINGS: There is a mildly displaced fracture of the medial malleolus. Minimally displaced fracture of the lateral malleolus. There is a nondisplaced fracture of the posterior malleolus. No other acute fracture. The bones are osteopenic. There is no dislocation. There is a total right knee arthroplasty. The arthroplasty components appear intact and in anatomic alignment. There is diffuse subcutaneous edema. IMPRESSION: 1. Trimalleolar fracture.  No dislocation. 2. Right knee arthroplasty appears intact. Electronically Signed   By: Anner Crete M.D.   On: 03/26/2021 02:44   CT HEAD WO CONTRAST (5MM)  Result Date: 03/26/2021 CLINICAL DATA:  Headache, new or worsening (Age >= 50y); Neck trauma (Age >= 65y) Neck trauma (Age >= 65y) EXAM: CT HEAD WITHOUT CONTRAST CT CERVICAL SPINE WITHOUT CONTRAST TECHNIQUE: Multidetector CT imaging of the head and cervical  spine was performed following the standard protocol without intravenous contrast. Multiplanar CT image reconstructions of the cervical spine were also generated. COMPARISON:  January 14, 2018. FINDINGS: CT HEAD FINDINGS Brain: No evidence of acute infarction, hemorrhage, hydrocephalus, extra-axial collection or mass lesion/mass effect. Mild to moderate for age patchy and confluent white matter hypoattenuation, nonspecific but compatible with chronic microvascular ischemic disease. Mild for age atrophy with ex vacuo ventricular dilation. Partially empty sella. Vascular: Calcific intracranial atherosclerosis. No hyperdense vessel identified. Skull: No acute fracture. Sinuses/Orbits: Clear sinuses.  Unremarkable orbits. Other: No mastoid effusions. CT CERVICAL SPINE FINDINGS Alignment: Similar alignment. Similar  mild anterolisthesis of C7 on T1, likely degenerative given stability and facet arthropathy at this level. Skull base and vertebrae: No evidence of acute fracture. Similar vertebral body heights. Osteopenia. Soft tissues and spinal canal: No prevertebral fluid or swelling. No visible canal hematoma. Disc levels: Similar moderate degenerative change at C5-C6 and C6-C7. Similar likely moderate foraminal stenosis at multiple levels due to facet/uncovertebral hypertrophy. Upper chest: Biapical pleuroparenchymal scarring and mild dependent probable atelectasis. Otherwise, lung apices are clear. Other: Calcific atherosclerosis. IMPRESSION: CT Head 1. No evidence of acute intracranial abnormality. 2. Mild-to-moderate chronic microvascular ischemic disease and atrophy. CT Cervical Spine 1. No evidence of acute fracture or traumatic malalignment. 2. Similar moderate degenerative change at C5-C6 and C6-C7 and multilevel facet arthropathy with likely moderate multilevel foraminal stenosis. Electronically Signed   By: Margaretha Sheffield M.D.   On: 03/26/2021 07:37   CT Cervical Spine Wo Contrast  Result Date:  03/26/2021 CLINICAL DATA:  Headache, new or worsening (Age >= 50y); Neck trauma (Age >= 65y) Neck trauma (Age >= 65y) EXAM: CT HEAD WITHOUT CONTRAST CT CERVICAL SPINE WITHOUT CONTRAST TECHNIQUE: Multidetector CT imaging of the head and cervical spine was performed following the standard protocol without intravenous contrast. Multiplanar CT image reconstructions of the cervical spine were also generated. COMPARISON:  January 14, 2018. FINDINGS: CT HEAD FINDINGS Brain: No evidence of acute infarction, hemorrhage, hydrocephalus, extra-axial collection or mass lesion/mass effect. Mild to moderate for age patchy and confluent white matter hypoattenuation, nonspecific but compatible with chronic microvascular ischemic disease. Mild for age atrophy with ex vacuo ventricular dilation. Partially empty sella. Vascular: Calcific intracranial atherosclerosis. No hyperdense vessel identified. Skull: No acute fracture. Sinuses/Orbits: Clear sinuses.  Unremarkable orbits. Other: No mastoid effusions. CT CERVICAL SPINE FINDINGS Alignment: Similar alignment. Similar mild anterolisthesis of C7 on T1, likely degenerative given stability and facet arthropathy at this level. Skull base and vertebrae: No evidence of acute fracture. Similar vertebral body heights. Osteopenia. Soft tissues and spinal canal: No prevertebral fluid or swelling. No visible canal hematoma. Disc levels: Similar moderate degenerative change at C5-C6 and C6-C7. Similar likely moderate foraminal stenosis at multiple levels due to facet/uncovertebral hypertrophy. Upper chest: Biapical pleuroparenchymal scarring and mild dependent probable atelectasis. Otherwise, lung apices are clear. Other: Calcific atherosclerosis. IMPRESSION: CT Head 1. No evidence of acute intracranial abnormality. 2. Mild-to-moderate chronic microvascular ischemic disease and atrophy. CT Cervical Spine 1. No evidence of acute fracture or traumatic malalignment. 2. Similar moderate degenerative  change at C5-C6 and C6-C7 and multilevel facet arthropathy with likely moderate multilevel foraminal stenosis. Electronically Signed   By: Margaretha Sheffield M.D.   On: 03/26/2021 07:37   DG Knee Complete 4 Views Right  Result Date: 03/26/2021 CLINICAL DATA:  Fall and right lower extremity pain. EXAM: RIGHT KNEE - COMPLETE 4+ VIEW; RIGHT ANKLE - COMPLETE 3+ VIEW COMPARISON:  None. FINDINGS: There is a mildly displaced fracture of the medial malleolus. Minimally displaced fracture of the lateral malleolus. There is a nondisplaced fracture of the posterior malleolus. No other acute fracture. The bones are osteopenic. There is no dislocation. There is a total right knee arthroplasty. The arthroplasty components appear intact and in anatomic alignment. There is diffuse subcutaneous edema. IMPRESSION: 1. Trimalleolar fracture.  No dislocation. 2. Right knee arthroplasty appears intact. Electronically Signed   By: Anner Crete M.D.   On: 03/26/2021 02:44   DG Ankle Right Port  Result Date: 03/26/2021 CLINICAL DATA:  Postreduction EXAM: PORTABLE RIGHT ANKLE - 2 VIEW COMPARISON:  Earlier today  FINDINGS: Acute trimalleolar fracture with mild posterior displacement at the distal fibula. No change in alignment when compared to prior. Generalized osteopenia soft tissue swelling. IMPRESSION: Stable ankle fracture alignment after splinting. Electronically Signed   By: Monte Fantasia M.D.   On: 03/26/2021 05:46   DG Hip Unilat With Pelvis 2-3 Views Right  Result Date: 03/26/2021 CLINICAL DATA:  Fall and right hip pain. EXAM: DG HIP (WITH OR WITHOUT PELVIS) 2-3V RIGHT COMPARISON:  Right hip radiograph dated 10/16/2020 FINDINGS: There is no acute fracture or dislocation. The bones are osteopenic. Mild bilateral hip arthritic changes. The soft tissues are unremarkable. IMPRESSION: No acute fracture or dislocation. Electronically Signed   By: Anner Crete M.D.   On: 03/26/2021 02:42    Assessment/Plan 1. Closed  trimalleolar fracture of right ankle with routine healing, subsequent encounter -ORIF right ankle 09/02 by Dr. Mardelle Matte - needs 2 week f/u with ortho - NWB RLE - aspirin 81 mg bid x 30 days for DVT prophylaxis - cont tramadol and tylenol prn for pain - cont PT/OT - cont skilled nursing care  2. Pancytopenia (HCC) - hgb 8.1, platelets 97 - hematology referral recommended  3. Acute cystitis without hematuria - urine culture positive for E.Coli - given IV ceftriaxone  - cont keflex x 5 days  4. Anemia of chronic disease - EBL 75 cc - given 1 unit PRBC postop - hgb 8.1 03/31/2021 - cbc/diff in 1 week  5. Essential hypertension - controlled - cont losartan 25 mg daily  6. Depression with anxiety - cont zoloft 25 mg daily - cont xanax 0.25 mg qhs prn  7. Acquired hypothyroidism - TSH 4.69 06/05/2020  8. Chronic constipation - abdomen distended, LBM unknown - nurse gave suppository this morning- per [facility] protocol - discontinue colace - start senna 8.6- 2 tablets qhs - start miralax daily prn  9. Stage 3b chronic kidney disease ([place]) - BUN/creat 36/1.3109/11/2020 - GFR 39 03/30/2021 - continue to avoid nephrotoxic drugs like NSAIDS and dose adjust medication to be renally excreted - bmp in 1 week   Family/ staff Communication: plan discussed with patient and nurse  Labs/tests ordered:  cbc/diff, cmp

## 2021-04-02 ENCOUNTER — Encounter (HOSPITAL_COMMUNITY): Payer: Self-pay | Admitting: Orthopedic Surgery

## 2021-04-03 LAB — CBC AND DIFFERENTIAL
HCT: 26 — AB (ref 36–46)
Hemoglobin: 8.2 — AB (ref 12.0–16.0)
Neutrophils Absolute: 12630
Platelets: 137 — AB (ref 150–399)
WBC: 15

## 2021-04-03 LAB — CBC: RBC: 3.23 — AB (ref 3.87–5.11)

## 2021-04-04 ENCOUNTER — Emergency Department (HOSPITAL_COMMUNITY): Payer: Medicare Other

## 2021-04-04 ENCOUNTER — Emergency Department (HOSPITAL_COMMUNITY)
Admission: EM | Admit: 2021-04-04 | Discharge: 2021-04-04 | Disposition: A | Discharge status: Skilled Nursing Facility | Payer: Medicare Other | Attending: Student | Admitting: Student

## 2021-04-04 ENCOUNTER — Encounter (HOSPITAL_COMMUNITY): Payer: Self-pay

## 2021-04-04 ENCOUNTER — Telehealth: Payer: Self-pay | Admitting: Adult Health

## 2021-04-04 ENCOUNTER — Other Ambulatory Visit: Payer: Self-pay

## 2021-04-04 DIAGNOSIS — E039 Hypothyroidism, unspecified: Secondary | ICD-10-CM | POA: Diagnosis not present

## 2021-04-04 DIAGNOSIS — M25552 Pain in left hip: Secondary | ICD-10-CM | POA: Diagnosis not present

## 2021-04-04 DIAGNOSIS — S22080A Wedge compression fracture of T11-T12 vertebra, initial encounter for closed fracture: Secondary | ICD-10-CM | POA: Diagnosis not present

## 2021-04-04 DIAGNOSIS — Z743 Need for continuous supervision: Secondary | ICD-10-CM | POA: Diagnosis not present

## 2021-04-04 DIAGNOSIS — M79672 Pain in left foot: Secondary | ICD-10-CM | POA: Diagnosis not present

## 2021-04-04 DIAGNOSIS — R531 Weakness: Secondary | ICD-10-CM | POA: Diagnosis not present

## 2021-04-04 DIAGNOSIS — S199XXA Unspecified injury of neck, initial encounter: Secondary | ICD-10-CM | POA: Diagnosis not present

## 2021-04-04 DIAGNOSIS — N183 Chronic kidney disease, stage 3 unspecified: Secondary | ICD-10-CM | POA: Insufficient documentation

## 2021-04-04 DIAGNOSIS — Z7982 Long term (current) use of aspirin: Secondary | ICD-10-CM | POA: Insufficient documentation

## 2021-04-04 DIAGNOSIS — Z7401 Bed confinement status: Secondary | ICD-10-CM | POA: Diagnosis not present

## 2021-04-04 DIAGNOSIS — R519 Headache, unspecified: Secondary | ICD-10-CM | POA: Diagnosis not present

## 2021-04-04 DIAGNOSIS — I129 Hypertensive chronic kidney disease with stage 1 through stage 4 chronic kidney disease, or unspecified chronic kidney disease: Secondary | ICD-10-CM | POA: Diagnosis not present

## 2021-04-04 DIAGNOSIS — S32009A Unspecified fracture of unspecified lumbar vertebra, initial encounter for closed fracture: Secondary | ICD-10-CM

## 2021-04-04 DIAGNOSIS — Z043 Encounter for examination and observation following other accident: Secondary | ICD-10-CM | POA: Diagnosis not present

## 2021-04-04 DIAGNOSIS — Y92128 Other place in nursing home as the place of occurrence of the external cause: Secondary | ICD-10-CM | POA: Diagnosis not present

## 2021-04-04 DIAGNOSIS — Z79899 Other long term (current) drug therapy: Secondary | ICD-10-CM | POA: Insufficient documentation

## 2021-04-04 DIAGNOSIS — M549 Dorsalgia, unspecified: Secondary | ICD-10-CM | POA: Insufficient documentation

## 2021-04-04 DIAGNOSIS — Z96651 Presence of right artificial knee joint: Secondary | ICD-10-CM | POA: Insufficient documentation

## 2021-04-04 DIAGNOSIS — M545 Low back pain, unspecified: Secondary | ICD-10-CM | POA: Diagnosis not present

## 2021-04-04 DIAGNOSIS — W19XXXA Unspecified fall, initial encounter: Secondary | ICD-10-CM

## 2021-04-04 DIAGNOSIS — W06XXXA Fall from bed, initial encounter: Secondary | ICD-10-CM | POA: Insufficient documentation

## 2021-04-04 DIAGNOSIS — S32010A Wedge compression fracture of first lumbar vertebra, initial encounter for closed fracture: Secondary | ICD-10-CM | POA: Diagnosis not present

## 2021-04-04 DIAGNOSIS — Z87891 Personal history of nicotine dependence: Secondary | ICD-10-CM | POA: Insufficient documentation

## 2021-04-04 DIAGNOSIS — S32020A Wedge compression fracture of second lumbar vertebra, initial encounter for closed fracture: Secondary | ICD-10-CM | POA: Diagnosis not present

## 2021-04-04 DIAGNOSIS — R0902 Hypoxemia: Secondary | ICD-10-CM | POA: Diagnosis not present

## 2021-04-04 LAB — COMPREHENSIVE METABOLIC PANEL
ALT: 24 U/L (ref 0–44)
AST: 37 U/L (ref 15–41)
Albumin: 3.2 g/dL — ABNORMAL LOW (ref 3.5–5.0)
Alkaline Phosphatase: 95 U/L (ref 38–126)
Anion gap: 11 (ref 5–15)
BUN: 27 mg/dL — ABNORMAL HIGH (ref 8–23)
CO2: 31 mmol/L (ref 22–32)
Calcium: 9.7 mg/dL (ref 8.9–10.3)
Chloride: 103 mmol/L (ref 98–111)
Creatinine, Ser: 0.98 mg/dL (ref 0.44–1.00)
GFR, Estimated: 56 mL/min — ABNORMAL LOW (ref >60)
Glucose, Bld: 104 mg/dL — ABNORMAL HIGH (ref 70–99)
Potassium: 4.5 mmol/L (ref 3.5–5.1)
Sodium: 145 mmol/L (ref 135–145)
Total Bilirubin: 0.5 mg/dL (ref 0.3–1.2)
Total Protein: 7.9 g/dL (ref 6.5–8.1)

## 2021-04-04 LAB — CBC WITH DIFFERENTIAL/PLATELET
Abs Immature Granulocytes: 0.33 10*3/uL — ABNORMAL HIGH (ref 0.00–0.07)
Basophils Absolute: 0.1 10*3/uL (ref 0.0–0.1)
Basophils Relative: 1 %
Eosinophils Absolute: 0 10*3/uL (ref 0.0–0.5)
Eosinophils Relative: 0 %
HCT: 27.8 % — ABNORMAL LOW (ref 36.0–46.0)
Hemoglobin: 8.2 g/dL — ABNORMAL LOW (ref 12.0–15.0)
Immature Granulocytes: 3 %
Lymphocytes Relative: 10 %
Lymphs Abs: 1.1 10*3/uL (ref 0.7–4.0)
MCH: 25.2 pg — ABNORMAL LOW (ref 26.0–34.0)
MCHC: 29.5 g/dL — ABNORMAL LOW (ref 30.0–36.0)
MCV: 85.3 fL (ref 80.0–100.0)
Monocytes Absolute: 0.9 10*3/uL (ref 0.1–1.0)
Monocytes Relative: 8 %
Neutro Abs: 8 10*3/uL — ABNORMAL HIGH (ref 1.7–7.7)
Neutrophils Relative %: 78 %
Platelets: 226 10*3/uL (ref 150–400)
RBC: 3.26 MIL/uL — ABNORMAL LOW (ref 3.87–5.11)
RDW: 19.7 % — ABNORMAL HIGH (ref 11.5–15.5)
WBC: 10.3 10*3/uL (ref 4.0–10.5)
nRBC: 0 % (ref 0.0–0.2)

## 2021-04-04 NOTE — ED Provider Notes (Signed)
[facility] DEPT Provider Note   CSN: [id number] Arrival date & time: 04/04/21  1755     History No chief complaint on file.   [name] [name] is a 87 y.o. female with PMH HTN, HLD who presents the emergency department for evaluation of a fall.  Patient is currently at a nursing facility after suffering a right ankle fracture and was found on the ground this morning by nursing home staff.  Patient given tramadol prior to arrival and per EMS was found to be 87% on room air.  Patient does not wear oxygen at home and arrives on 4 L nasal cannula.  She complains of left hip pain and arrives in a c-collar.  Patient is very somnolent on initial examination but arouses and answers questions with loud voice.  HPI     Past Medical History:  Diagnosis Date   Anemia    Anxiety    Chronotropic incompetence 12/2012    potentially medication related; noted on CPET    DDD (degenerative disc disease)    Eczema    GERD (gastroesophageal reflux disease)    H/O hiatal hernia    Hx: UTI (urinary tract infection)    Hyperlipidemia    Hypertension    Hypothyroidism    Osteopenia    Septicemia ([place]) 2002   following UTI   Vertigo, benign positional     Patient Active Problem List   Diagnosis Date Noted   Ankle fracture 03/26/2021   Hyperlipidemia 10/21/2020   Right hip pain 09/01/2020   Blepharitis of both eyes 06/10/2020   Urogenital candidiasis 06/03/2020   Perineal itching, female 02/11/2020   External hemorrhoids 11/05/2019   Rash 10/23/2019   Intermittent left lower quadrant abdominal pain 10/23/2019   Tremor of both hands 09/14/2019   CKD (chronic kidney disease) stage 3, GFR 30-59 ml/min ([place]) 05/11/2019   Recurrent falls 01/14/2018   PVD (peripheral vascular disease) ([place]) 12/21/2017   Chronic bilateral low back pain without sciatica 12/21/2017   Depression, recurrent ([place]) 08/01/2017   Pancytopenia ([place]) 06/29/2017   Depression, major, single  episode, moderate ([place]) 06/29/2017   Osteoporosis 05/25/2017   Acquired hypothyroidism 05/25/2017   B12 deficiency 05/25/2017   Recurrent cystitis 05/25/2017   Chronic back pain 05/25/2017   GERD (gastroesophageal reflux disease) 12/22/2016   Depression with anxiety 12/22/2016   Chronic constipation 10/26/2016   Compression fracture of L2 lumbar vertebra, closed, initial encounter ([place]) 10/21/2016   Thrombocytopenia ([place]) 10/21/2016   Hypertensive heart disease 05/02/2016   Bilateral leg edema 04/30/2016   Exertional dyspnea 09/29/2013   Essential hypertension 01/29/2013   Fatigue with decreased exercise tolerance 12/25/2012   Chronotropic incompetence 12/24/2012   Aortic ejection murmur 12/22/2012   Esophagitis 05/04/2012   GI bleed 05/03/2012   Anemia of chronic disease 05/03/2012   Postop Transfusion 04/27/2012   OA (osteoarthritis) of knee 04/24/2012    Past Surgical History:  Procedure Laterality Date   BREAST SURGERY     left biopsy   CATARACT EXTRACTION Right    2 weeks ago   CPET / MET - PFTS     Consistent with chronotropic incompetence; See attached report in the results section   DILATION AND CURETTAGE OF UTERUS     x 2   DOPPLER ECHOCARDIOGRAPHY  01/10/2013   Normal LV size and function. Normal EF. Air sclerosis but no stenosis   ESOPHAGOGASTRODUODENOSCOPY  05/04/2012   Procedure: ESOPHAGOGASTRODUODENOSCOPY (EGD);  Surgeon: Inda Castle, MD;  Location: [facility];  Service: Endoscopy;  Laterality: N/A;   EYE SURGERY     cataract extraction with ILO  ? eye   IR KYPHO EA ADDL LEVEL THORACIC OR LUMBAR  10/28/2016   IR KYPHO LUMBAR INC FX REDUCE BONE BX UNI/BIL CANNULATION INC/IMAGING  10/28/2016   IR KYPHO THORACIC WITH BONE BIOPSY  12/24/2016   IR RADIOLOGIST EVAL & MGMT  11/11/2016   KNEE ARTHROSCOPY  2011   right   ORIF ANKLE FRACTURE Right 03/27/2021   Procedure: OPEN REDUCTION INTERNAL FIXATION (ORIF) ANKLE FRACTURE;  Surgeon: Marchia Bond, MD;  Location:  [facility] ORS;  Service: Orthopedics;  Laterality: Right;   TONSILLECTOMY     TOTAL KNEE ARTHROPLASTY  04/24/2012   Procedure: TOTAL KNEE ARTHROPLASTY;  Surgeon: Gearlean Alf, MD;  Location: [facility] ORS;  Service: Orthopedics;  Laterality: Right;   TRANSTHORACIC ECHOCARDIOGRAM  02/2018   Ordered by PCP for "congestive heart failure " -EF 60 M 65%.  GR 1 DD.  No R WMA--- > ESSENTIALLY NORMAL     OB History   No obstetric history on file.     Family History  Problem Relation Age of Onset   Lung cancer Father 57       Died at age 49   Stroke Mother 72       Died in her late 48s.   Heart attack Mother 67   Diabetes Mother    Arthritis Mother     Social History   Tobacco Use   Smoking status: Former    Years: 0.00    Types: Cigarettes    Quit date: 05/03/1965    Years since quitting: 55.9   Smokeless tobacco: Never  Vaping Use   Vaping Use: Never used  Substance Use Topics   Alcohol use: No    Comment: occ glass wine   Drug use: No    Home Medications Prior to Admission medications   Medication Sig Start Date End Date Taking? Authorizing Provider  acetaminophen (TYLENOL) 500 MG tablet Take 1,000 mg by mouth 3 (three) times daily as needed for mild pain.    [provider]  ALPRAZolam Duanne Moron) 0.25 MG tablet Take 1 tablet (0.25 mg total) by mouth at bedtime as needed for sleep or anxiety. 03/31/21   Oswald Hillock, MD  aspirin EC 81 MG tablet Take 1 tablet (81 mg total) by mouth 2 (two) times daily. For DVT prophylaxis for 30 days after surgery. 03/31/21   Merlene Pulling K, PA-C  atorvastatin (LIPITOR) 10 MG tablet Take 5 mg by mouth daily.     [provider]  buPROPion (WELLBUTRIN SR) 150 MG 12 hr tablet Take 1 tablet (150 mg total) by mouth 2 (two) times daily after a meal. 04/10/18   Blanchie Serve, MD  calcium citrate-vitamin D (CITRACAL+D) 315-200 MG-UNIT tablet Take 1 tablet by mouth 2 (two) times daily.    [provider]  Camphor-Menthol-Methyl Sal  3.07-31-08 % PTCH Place 1 patch onto the skin daily. Apply one to lower back daily for back pain    [provider]  Carboxymethylcellulose Sodium (ARTIFICIAL TEARS OP) Apply 1 drop to eye 3 (three) times daily as needed (dry eyes).    [provider]  cephALEXin (KEFLEX) 500 MG capsule Take 1 capsule (500 mg total) by mouth 2 (two) times daily for 5 days. 03/31/21 04/05/21  Oswald Hillock, MD  docusate sodium (COLACE) 100 MG capsule Take 100 mg by mouth 2 (two) times daily.    [provider]  ferrous sulfate 325 (65 FE) MG EC tablet Take 325 mg by mouth every Monday, Wednesday, and Friday. With a meal    [provider]  fish oil-omega-3 fatty acids 1000 MG capsule Take 1 g by mouth daily.    [provider]  gabapentin (NEURONTIN) 100 MG capsule Take 100 mg by mouth 3 (three) times daily.    [provider]  hydrocortisone cream 1 % Apply 1 application topically daily as needed for itching.    [provider]  levothyroxine (SYNTHROID) 75 MCG tablet Take 75 mcg by mouth daily. 03/06/21   [provider]  losartan (COZAAR) 25 MG tablet Take 25 mg by mouth daily. 02/16/21   [provider]  nystatin (MYCOSTATIN/NYSTOP) powder Apply 1 application topically 2 (two) times daily as needed (yeast).    [provider]  omeprazole (PRILOSEC) 40 MG capsule Take 40 mg by mouth daily.    [provider]  polyethylene glycol (MIRALAX / GLYCOLAX) 17 g packet Take 17 g by mouth as directed. On Monday and Thursday    [provider]  potassium chloride SA (KLOR-CON) 20 MEQ tablet Take 20 mEq by mouth 2 (two) times daily.    [provider]  sertraline (ZOLOFT) 25 MG tablet Take 75 mg by mouth daily.    [provider]  torsemide (DEMADEX) 20 MG tablet Take 20 mg by mouth daily.    [provider]  traMADol (ULTRAM) 50 MG tablet Take 1.5 tablets (75 mg total) by mouth every 6 (six) hours  as needed for moderate pain. 04/01/21   Fargo, [name] E, NP  zinc oxide 20 % ointment Apply 1 application topically as needed for irritation.    [provider]    Allergies    Patient has no known allergies.  Review of Systems   Review of Systems  Constitutional:  Negative for chills and fever.  HENT:  Negative for ear pain and sore throat.   Eyes:  Negative for pain and visual disturbance.  Respiratory:  Negative for cough and shortness of breath.   Cardiovascular:  Negative for chest pain and palpitations.  Gastrointestinal:  Negative for abdominal pain and vomiting.  Genitourinary:  Negative for dysuria and hematuria.  Musculoskeletal:  Positive for back pain. Negative for arthralgias.       Left hip pain  Skin:  Negative for color change and rash.  Neurological:  Negative for seizures and syncope.  All other systems reviewed and are negative.  Physical Exam Updated Vital Signs There were no vitals taken for this visit.  Physical Exam Vitals and nursing note reviewed.  Constitutional:      General: She is not in acute distress.    Appearance: She is well-developed.  HENT:     Head: Normocephalic and atraumatic.  Eyes:     Conjunctiva/sclera: Conjunctivae normal.  Cardiovascular:     Rate and Rhythm: Normal rate and regular rhythm.     Heart sounds: No murmur heard. Pulmonary:     Effort: Pulmonary effort is normal. No respiratory distress.     Breath sounds: Normal breath sounds.  Abdominal:     Palpations: Abdomen is soft.     Tenderness: There is no abdominal tenderness.  Musculoskeletal:        General: Tenderness (Left hip, worse with external lower extremity rotation) present.     Cervical back: Neck supple.  Skin:    General: Skin is warm and dry.  Neurological:  Mental Status: She is alert and oriented to person, place, and time.     Comments: Oriented but somnolent    ED Results / Procedures / Treatments   Labs (all labs ordered are listed,  but only abnormal results are displayed) Labs Reviewed - No data to display  EKG None  Radiology No results found.  Procedures Procedures   Medications Ordered in ED Medications - No data to display  ED Course  I have reviewed the triage vital signs and the nursing notes.  Pertinent labs & imaging results that were available during my care of the patient were reviewed by me and considered in my medical decision making (see chart for details).    MDM Rules/Calculators/A&P                           Patient seen the emergency department for evaluation of a fall back and hip pain.  Physical exam reveals a somnolent but arousable patient with tenderness to the left hip with external rotation of the lower extremity.  Right lower extremity in a splint with expected postsurgical tenderness. no additional external signs of trauma.  Laboratory evaluation reveals a hemoglobin of 8.2 which is baseline for this patient.  Chest x-ray, CT cervical and CT head unremarkable.  X-ray of the pelvis and left hip is unremarkable with no fracture.  CT L-spine with transverse process fractures but no acute vertebral body fractures.  X-ray of the left foot was obtained after reevaluation and patient had left foot tenderness.  These were also negative.  On reevaluation, patient appears to be intermittently hypoxic when sleeping, but this hypoxia resolves when the patient wakes.  She will be discharged back to her facility at this time.  Long discussion with the patient's son about polypharmacy and dosing for tramadol.  The patient appears to be oversedated on my exam. Final Clinical Impression(s) / ED Diagnoses Final diagnoses:  None    Rx / DC Orders ED Discharge Orders     None        Bishop Vanderwerf, Debe Coder, MD 04/04/21 2234

## 2021-04-04 NOTE — Discharge Instructions (Addendum)
You were seen in the emergency department for evaluation of a fall and low back pain.  Your imaging here in the emergency department was reassuringly negative outside of some transverse process fractures in the lumbar spine.  These do not require surgery and you will be safe to go back to her facility today.  I am concerned about the total amount of sedative medications that you have been taking your facility and I would recommend discussing polypharmacy with your primary care physician Dr. [name].  I think tramadol is a poor choice in an 87 year old female due to its significant side effect profile.  Scheduled regimens of ibuprofen and Tylenol with oxycodone for breakthrough pain may be a better option, but I will leave this choice to your primary care physician.  You will be discharged on 1 L home oxygen as your oxygen levels appear to drop when you are asleep.

## 2021-04-04 NOTE — Telephone Encounter (Signed)
Nurse called to report this resident fell and is not able to get off the floor. She is having left hip pain and back pain and also thinks she hit her head. When they checked her vitals her 02 sats were 88%.  Nurse [facility] at [facility] called to give me this information and I requested that we call 911 to help get her off the floor and evaluating her after the fall.

## 2021-04-04 NOTE — ED Triage Notes (Signed)
Patient BIB [facility] from nursing facility, [facility]. Patient fell out of bed and is c/o back and right hip pain. Patient had a recent fall  which led to rods in her right leg/ankle. Patient is AxOx4. Nursing facility gave patient tramadol.  150/70 64-HR 99% on 4L (87% on room air)

## 2021-04-04 NOTE — ED Notes (Signed)
RN attempted to call report at [facility], no answer.

## 2021-04-04 NOTE — ED Notes (Signed)
[facility] called for patient 

## 2021-04-06 ENCOUNTER — Encounter: Payer: Self-pay | Admitting: Nurse Practitioner

## 2021-04-06 ENCOUNTER — Encounter: Payer: Self-pay | Admitting: Internal Medicine

## 2021-04-06 ENCOUNTER — Non-Acute Institutional Stay (SKILLED_NURSING_FACILITY): Payer: Medicare Other | Admitting: Internal Medicine

## 2021-04-06 DIAGNOSIS — I739 Peripheral vascular disease, unspecified: Secondary | ICD-10-CM

## 2021-04-06 DIAGNOSIS — N3000 Acute cystitis without hematuria: Secondary | ICD-10-CM | POA: Diagnosis not present

## 2021-04-06 DIAGNOSIS — M545 Low back pain, unspecified: Secondary | ICD-10-CM

## 2021-04-06 DIAGNOSIS — K219 Gastro-esophageal reflux disease without esophagitis: Secondary | ICD-10-CM

## 2021-04-06 DIAGNOSIS — R5383 Other fatigue: Secondary | ICD-10-CM | POA: Diagnosis not present

## 2021-04-06 DIAGNOSIS — F321 Major depressive disorder, single episode, moderate: Secondary | ICD-10-CM

## 2021-04-06 DIAGNOSIS — I10 Essential (primary) hypertension: Secondary | ICD-10-CM

## 2021-04-06 DIAGNOSIS — K5909 Other constipation: Secondary | ICD-10-CM

## 2021-04-06 DIAGNOSIS — N3090 Cystitis, unspecified without hematuria: Secondary | ICD-10-CM

## 2021-04-06 DIAGNOSIS — E039 Hypothyroidism, unspecified: Secondary | ICD-10-CM | POA: Diagnosis not present

## 2021-04-06 DIAGNOSIS — S82851D Displaced trimalleolar fracture of right lower leg, subsequent encounter for closed fracture with routine healing: Secondary | ICD-10-CM

## 2021-04-06 DIAGNOSIS — D61818 Other pancytopenia: Secondary | ICD-10-CM

## 2021-04-06 DIAGNOSIS — N1832 Chronic kidney disease, stage 3b: Secondary | ICD-10-CM

## 2021-04-06 DIAGNOSIS — N1831 Chronic kidney disease, stage 3a: Secondary | ICD-10-CM

## 2021-04-06 DIAGNOSIS — S82891A Other fracture of right lower leg, initial encounter for closed fracture: Secondary | ICD-10-CM

## 2021-04-06 DIAGNOSIS — D638 Anemia in other chronic diseases classified elsewhere: Secondary | ICD-10-CM

## 2021-04-06 DIAGNOSIS — F418 Other specified anxiety disorders: Secondary | ICD-10-CM

## 2021-04-06 DIAGNOSIS — G8929 Other chronic pain: Secondary | ICD-10-CM

## 2021-04-06 LAB — CBC
HCT: 21.3 % — ABNORMAL LOW (ref 36.0–46.0)
Hemoglobin: 6.6 g/dL — CL (ref 12.0–15.0)
MCH: 27.6 pg (ref 26.0–34.0)
MCHC: 31 g/dL (ref 30.0–36.0)
MCV: 89.1 fL (ref 80.0–100.0)
Platelets: 92 10*3/uL — ABNORMAL LOW (ref 150–400)
RBC: 2.39 MIL/uL — ABNORMAL LOW (ref 3.87–5.11)
RDW: 17.8 % — ABNORMAL HIGH (ref 11.5–15.5)
WBC: 13.5 10*3/uL — ABNORMAL HIGH (ref 4.0–10.5)
nRBC: 0 % (ref 0.0–0.2)

## 2021-04-06 NOTE — Assessment & Plan Note (Signed)
takes Levothyroxine qd, TSH 3.82 03/22/21

## 2021-04-06 NOTE — Assessment & Plan Note (Signed)
stage 3 Bun/creat 27/0.98, eGFR 56 04/04/21

## 2021-04-06 NOTE — Assessment & Plan Note (Addendum)
ED evaluation 04/04/21 for fall, resulted in closed fracture of transverse process of lumbar vertebra L1, L2 CT lumbar 04/04/21. May consider Oxycodone if needed. Continue Tylenol.  Lower back pain, takes Gabapentin 100mg  tid. 10/16/20 X-ray Vertebroplasty of T9, T11, L1, and L2 have been performed with stable compression deformities

## 2021-04-06 NOTE — Assessment & Plan Note (Signed)
takes Fe 3x/wk, Hgb 8.2 04/04/21, recommended hematology eval

## 2021-04-06 NOTE — Assessment & Plan Note (Signed)
takes Omeprazole 40mg qd  

## 2021-04-06 NOTE — Progress Notes (Signed)
Provider:  Veleta Miners MD Location:   [facility] Room Number: 35 Place of Service:  SNF (31)  PCP: Virgie Dad, MD Patient Care Team: Virgie Dad, MD as PCP - General (Internal Medicine) Mast, Man X, NP as Nurse Practitioner (Internal Medicine)  Extended Emergency Contact Information Primary Emergency Contact: Mcdermid,[name] Address: [address]          [place], [place] [address] [place] Phone: [phone] Mobile Phone: [phone] Relation: Son Secondary Emergency Contact: [name] [place] Phone: [phone] Mobile Phone: [phone] Relation: Other  Code Status: DNR Managed Care Goals of Care: Advanced Directive information Advanced Directives 04/06/2021  Does Patient Have a Medical Advance Directive? Yes  Type of Paramedic of [place];Out of facility DNR (pink MOST or yellow form)  Does patient want to make changes to medical advance directive? No - Patient declined  Copy of [place] in Chart? Yes - validated most recent copy scanned in chart (See row information)  Pre-existing out of facility DNR order (yellow form or pink MOST form) Yellow form placed in chart (order not valid for inpatient use);Pink MOST form placed in chart (order not valid for inpatient use)      Chief Complaint  Patient presents with   New Admit To SNF    Admission to SNF    HPI: Patient is a 87 y.o. female seen today for admission to SNF  Admitted in the hospital From 9/1-9/7 for Right Trimalleolar Fracture. S/P ORIF   Patient has h/o Pancytopenia , h/o Chronic Back Pain, Hypertension, Hypothyroidism, Hypertension, Recurrent Cystitis, Osteoporosis and Major Depression   She fell in her Room in [place]. Was found to have Right Trimalleolar Ankle Fracture Underwent ORIF and Anterior Talofibular Ligament repair on 09/02  She is now in SNF for Therapy Un fortunately Patient fell  on 09/10 after she was trying to get to go the bathroom. She is NWB She was send to ED where she was found to be lethargic Her CT scan of head o showed Atrophy with chronic microvascular disease Her lethargy was thought  due to high-dose of tramadol.  Per son patient has also they have also noticed some Visual  hallucinations. Her tramadol was discontinued.  Patient is now only on Tylenol for pain control Her son wanted to meet with me today to discuss pain management. .  Patient also had hemoglobin of 6.6 needing 1 unit of PRBC She was also on Keflex for 5 days for E. coli UTI Also He POX was 88 5 in RA in ED and she is now on 1 litre She denies cough or Chest pain or Fever or SOB  Patient herself is more alert responsive.  She states her pain level is 7 out of 10.  She does remember having hallucinations.  Appetite has been poor.  Bowels have now started working.   Past Medical History:  Diagnosis Date   Anemia    Anxiety    Chronotropic incompetence 12/2012    potentially medication related; noted on CPET    DDD (degenerative disc disease)    Eczema    GERD (gastroesophageal reflux disease)    H/O hiatal hernia    Hx: UTI (urinary tract infection)    Hyperlipidemia    Hypertension    Hypothyroidism    Osteopenia    Septicemia ([place]) 2002   following UTI   Vertigo, benign positional  Past Surgical History:  Procedure Laterality Date   BREAST SURGERY     left biopsy   CATARACT EXTRACTION Right    2 weeks ago   CPET / MET - PFTS     Consistent with chronotropic incompetence; See attached report in the results section   DILATION AND CURETTAGE OF UTERUS     x 2   DOPPLER ECHOCARDIOGRAPHY  01/10/2013   Normal LV size and function. Normal EF. Air sclerosis but no stenosis   ESOPHAGOGASTRODUODENOSCOPY  05/04/2012   Procedure: ESOPHAGOGASTRODUODENOSCOPY (EGD);  Surgeon: Inda Castle, MD;  Location: [facility];  Service: Endoscopy;  Laterality: N/A;   EYE SURGERY      cataract extraction with ILO  ? eye   IR KYPHO EA ADDL LEVEL THORACIC OR LUMBAR  10/28/2016   IR KYPHO LUMBAR INC FX REDUCE BONE BX UNI/BIL CANNULATION INC/IMAGING  10/28/2016   IR KYPHO THORACIC WITH BONE BIOPSY  12/24/2016   IR RADIOLOGIST EVAL & MGMT  11/11/2016   KNEE ARTHROSCOPY  2011   right   ORIF ANKLE FRACTURE Right 03/27/2021   Procedure: OPEN REDUCTION INTERNAL FIXATION (ORIF) ANKLE FRACTURE;  Surgeon: Marchia Bond, MD;  Location: [facility];  Service: Orthopedics;  Laterality: Right;   TONSILLECTOMY     TOTAL KNEE ARTHROPLASTY  04/24/2012   Procedure: TOTAL KNEE ARTHROPLASTY;  Surgeon: Gearlean Alf, MD;  Location: [facility];  Service: Orthopedics;  Laterality: Right;   TRANSTHORACIC ECHOCARDIOGRAM  02/2018   Ordered by PCP for "congestive heart failure " -EF 60 M 65%.  GR 1 DD.  No R WMA--- > ESSENTIALLY NORMAL    reports that she quit smoking about 55 years ago. Her smoking use included cigarettes. She has never used smokeless tobacco. She reports that she does not drink alcohol and does not use drugs. Social History   Socioeconomic History   Marital status: Widowed    Spouse name: [name]   Number of children: 3   Years of education: Not on file   Highest education level: Not on file  Occupational History   Occupation: homemaker  Tobacco Use   Smoking status: Former    Years: 0.00    Types: Cigarettes    Quit date: 05/03/1965    Years since quitting: 55.9   Smokeless tobacco: Never  Vaping Use   Vaping Use: Never used  Substance and Sexual Activity   Alcohol use: No    Comment: occ glass wine   Drug use: No   Sexual activity: Not on file  Other Topics Concern   Not on file  Social History Narrative   She is a widowed mother of 3.   She is a former smoker, with a distant history having quit in 1966.   She does drink an occasional glass of red wine.   She is just now started to try doing exercise with water aerobics.   Social Determinants of Health   Financial Resource  Strain: Not on file  Food Insecurity: Not on file  Transportation Needs: Not on file  Physical Activity: Not on file  Stress: Not on file  Social Connections: Not on file  Intimate Partner Violence: Not on file    Functional Status Survey:    Family History  Problem Relation Age of Onset   Lung cancer Father 52       Died at age 56   Stroke Mother 30       Died in her late 31s.   Heart attack Mother  51   Diabetes Mother    Arthritis Mother     Health Maintenance  Topic Date Due   PNA vac Low Risk Adult (2 of 2 - PCV13) 05/14/2020   INFLUENZA VACCINE  02/23/2021   COVID-19 Vaccine (5 - Booster for Moderna series) 05/02/2021   TETANUS/TDAP  05/14/2029   DEXA SCAN  Completed   Zoster Vaccines- Shingrix  Completed   HPV VACCINES  Aged Out    No Known Allergies  Allergies as of 04/06/2021   No Known Allergies      Medication List        Accurate as of April 06, 2021  3:56 PM. If you have any questions, ask your nurse or doctor.          STOP taking these medications    ARTIFICIAL TEARS OP Stopped by: Virgie Dad, MD   traMADol 50 MG tablet Commonly known as: ULTRAM Stopped by: Man X Mast, NP       TAKE these medications    acetaminophen 500 MG tablet Commonly known as: TYLENOL Take 1,000 mg by mouth 3 (three) times daily as needed for mild pain.   ALPRAZolam 0.25 MG tablet Commonly known as: XANAX Take 1 tablet (0.25 mg total) by mouth at bedtime as needed for sleep or anxiety.   aspirin EC 81 MG tablet Take 1 tablet (81 mg total) by mouth 2 (two) times daily. For DVT prophylaxis for 30 days after surgery.   atorvastatin 10 MG tablet Commonly known as: LIPITOR Take 5 mg by mouth at bedtime.   buPROPion 150 MG 12 hr tablet Commonly known as: WELLBUTRIN SR Take 1 tablet (150 mg total) by mouth 2 (two) times daily after a meal.   calcium citrate-vitamin D 315-200 MG-UNIT tablet Commonly known as: CITRACAL+D Take 1 tablet by mouth 2  (two) times daily.   Camphor-Menthol-Methyl Sal 3.07-31-08 % Ptch Place 1 patch onto the skin See admin instructions. Apply one patch onto the skin of the  lower back daily for back pain   ferrous sulfate 325 (65 FE) MG EC tablet Take 325 mg by mouth every Monday, Wednesday, and Friday. With a meal   fish oil-omega-3 fatty acids 1000 MG capsule Take 1 g by mouth every morning.   gabapentin 100 MG capsule Commonly known as: NEURONTIN Take 100 mg by mouth 3 (three) times daily.   hydrocortisone cream 1 % Apply 1 application topically daily as needed for itching.   levothyroxine 75 MCG tablet Commonly known as: SYNTHROID Take 75 mcg by mouth every morning.   losartan 25 MG tablet Commonly known as: COZAAR Take 25 mg by mouth every morning.   nystatin powder Commonly known as: MYCOSTATIN/NYSTOP Apply 1 application topically 2 (two) times daily as needed (yeast).   omeprazole 40 MG capsule Commonly known as: PRILOSEC Take 40 mg by mouth every morning.   polyethylene glycol 17 g packet Commonly known as: MIRALAX / GLYCOLAX Take 17 g by mouth daily as needed (constipation).   polyvinyl alcohol 1.4 % ophthalmic solution Commonly known as: LIQUIFILM TEARS Place 1 drop into both eyes 3 (three) times daily.   potassium chloride SA 20 MEQ tablet Commonly known as: KLOR-CON Take 20 mEq by mouth 2 (two) times daily.   senna 8.6 MG Tabs tablet Commonly known as: SENOKOT Take 2 tablets by mouth at bedtime.   sertraline 25 MG tablet Commonly known as: ZOLOFT Take 75 mg by mouth every morning.   torsemide 20 MG tablet  Commonly known as: DEMADEX Take 20 mg by mouth every morning.   Tubersol 5 UNIT/0.1ML injection Generic drug: tuberculin Inject 5 Units into the skin every 14 (fourteen) days.   zinc oxide 20 % ointment Apply 1 application topically See admin instructions. Apply topically to per/buttocks after every incontinent episode and as needed for redness         Review of Systems  Constitutional:  Positive for activity change and appetite change.  HENT: Negative.    Respiratory: Negative.    Cardiovascular: Negative.   Gastrointestinal:  Positive for constipation.  Genitourinary: Negative.   Musculoskeletal:  Positive for arthralgias, back pain, gait problem and myalgias.  Skin: Negative.   Neurological:  Positive for weakness.  Psychiatric/Behavioral:  Positive for hallucinations. The patient is nervous/anxious.    Vitals:   04/06/21 1330  BP: 131/60  Pulse: 84  Resp: 16  Temp: (!) 97.5 F (36.4 C)  SpO2: 90%  Weight: 182 lb 1.9 oz (82.6 kg)  Height: '5\' 4"'  (1.626 m)   Body mass index is 31.26 kg/m. Physical Exam Vitals reviewed.  Constitutional:      Appearance: She is obese.  HENT:     Head: Normocephalic.     Nose: Nose normal.     Mouth/Throat:     Mouth: Mucous membranes are moist.     Pharynx: Oropharynx is clear.  Eyes:     Pupils: Pupils are equal, round, and reactive to light.  Cardiovascular:     Rate and Rhythm: Normal rate and regular rhythm.  Pulmonary:     Effort: Pulmonary effort is normal.     Breath sounds: Normal breath sounds. No wheezing or rales.     Comments: Has few rales in Right lower lung Abdominal:     General: Abdomen is flat. Bowel sounds are normal.     Palpations: Abdomen is soft.  Musculoskeletal:        General: Normal range of motion.     Cervical back: Neck supple.  Skin:    General: Skin is warm and dry.  Neurological:     General: No focal deficit present.     Mental Status: She is alert and oriented to person, place, and time.  Psychiatric:        Mood and Affect: Mood normal.        Thought Content: Thought content normal.    Labs reviewed: Basic Metabolic Panel: Recent Labs    03/28/21 0547 03/30/21 0503 04/04/21 1827  NA 137 137 145  K 4.0 3.5 4.5  CL 104 100 103  CO2 '28 31 31  ' GLUCOSE 117* 101* 104*  BUN 22 36* 27*  CREATININE 1.00 1.31* 0.98  CALCIUM 9.2  8.9 9.7   Liver Function Tests: Recent Labs    06/05/20 0000 03/27/21 0543 04/04/21 1827  AST 20 21 37  ALT '14 16 24  ' ALKPHOS 94 69 95  BILITOT  --  0.4 0.5  PROT  --  7.4 7.9  ALBUMIN 3.9 3.7 3.2*   No results for input(s): LIPASE, AMYLASE in the last 8760 hours. No results for input(s): AMMONIA in the last 8760 hours. CBC: Recent Labs    06/05/20 0000 10/16/20 1804 03/26/21 0334 03/27/21 0543 03/30/21 0503 03/31/21 0444 04/04/21 1827  WBC 4.4   < > 11.0*   < > 10.7* 9.3 10.3  NEUTROABS 2,213.00  --  8.6*  --   --   --  8.0*  HGB 9.7*   < > 10.0*   < >  7.6* 8.1* 8.2*  HCT 31*   < > 33.7*   < > 24.6* 26.1* 27.8*  MCV  --    < > 91.6   < > 83.4 82.9 85.3  PLT 77*   < > 46*   < > 83* 97* 226   < > = values in this interval not displayed.   Cardiac Enzymes: No results for input(s): CKTOTAL, CKMB, CKMBINDEX, TROPONINI in the last 8760 hours. BNP: Invalid input(s): POCBNP No results found for: HGBA1C Lab Results  Component Value Date   TSH 4.69 06/05/2020   Lab Results  Component Value Date   VITAMINB12 1,276 08/07/2019   Lab Results  Component Value Date   FOLATE 23.0 10/25/2017   Lab Results  Component Value Date   IRON 94 10/25/2017   TIBC 366 10/25/2017   FERRITIN 158 08/06/2019    Imaging and Procedures obtained prior to SNF admission: CT HEAD WO CONTRAST (5MM)  Result Date: 04/04/2021 CLINICAL DATA:  Head trauma.  Altered mental status. EXAM: CT HEAD WITHOUT CONTRAST TECHNIQUE: Contiguous axial images were obtained from the base of the skull through the vertex without intravenous contrast. COMPARISON:  March 26, 2021 FINDINGS: Brain: No evidence of acute infarction, hemorrhage, hydrocephalus, extra-axial collection or mass lesion/mass effect. Marked brain parenchymal volume loss and deep white matter microangiopathy. Vascular: No hyperdense vessel or unexpected calcification. Skull: Normal. Negative for fracture or focal lesion. Sinuses/Orbits: No  acute finding. Other: None. IMPRESSION: 1. No acute intracranial abnormality. 2. Marked brain parenchymal atrophy and chronic microvascular disease. Electronically Signed   By: Fidela Salisbury M.D.   On: 04/04/2021 18:51   CT Cervical Spine Wo Contrast  Result Date: 04/04/2021 CLINICAL DATA:  Head trauma, fall. Patient in a cervical spine collar. EXAM: CT CERVICAL SPINE WITHOUT CONTRAST TECHNIQUE: Multidetector CT imaging of the cervical spine was performed without intravenous contrast. Multiplanar CT image reconstructions were also generated. COMPARISON:  March 26, 2021. FINDINGS: Alignment: Accentuation of normal cervical lordotic curvature is unchanged compared to recent imaging in the setting of multilevel degenerative change. Skull base and vertebrae: No acute fracture. No primary bone lesion or focal pathologic process. Soft tissues and spinal canal: No prevertebral fluid or swelling. No visible canal hematoma. Disc levels: Multilevel spinal degenerative changes are unchanged compared to prior imaging greatest in the mid cervical spine with respect to disc space narrowing and greatest in the upper to mid cervical spine with respect to facet arthropathy Upper chest: Biapical scarring similar to prior imaging. Other: None IMPRESSION: No acute fracture or static subluxation of the cervical spine. Multilevel spinal degenerative changes are unchanged compared to prior imaging. Electronically Signed   By: Zetta Bills M.D.   On: 04/04/2021 18:56   CT Lumbar Spine Wo Contrast  Result Date: 04/04/2021 CLINICAL DATA:  Fall back pain EXAM: CT LUMBAR SPINE WITHOUT CONTRAST TECHNIQUE: Multidetector CT imaging of the lumbar spine was performed without intravenous contrast administration. Multiplanar CT image reconstructions were also generated. COMPARISON:  Radiograph 10/16/2020, CT 10/24/2016, CT 03/26/2021 FINDINGS: Segmentation: 5 lumbar type vertebrae. Alignment: S shaped scoliosis. Sagittal alignment  within normal limits. Vertebrae: Treated compression deformities at T11, L1 and L2. Remaining vertebra demonstrate normal stature. Subacute appearing fracture right transverse process at L1, acute to subacute right transverse process fracture at L2. Paraspinal and other soft tissues: No paravertebral or paraspinal soft tissue abnormality. Aortic atherosclerosis. Fatty atrophy of paraspinous muscles. Disc levels: At T12-L1, maintained disc space.  No canal stenosis. At L1-L2, maintained disc  space. No canal stenosis. The foramen patent. At L2-L3, maintained disc space. Diffuse disc bulge, asymmetric to the left. No canal stenosis. No high-grade foraminal narrowing. At L3-L4, maintained disc space. No canal stenosis. Facet degenerative changes. No high-grade foraminal narrowing. At L4-L5, moderate to marked disc space narrowing. Diffuse disc bulge. Ligamentum flavum thickening and facet degeneration with mild canal stenosis. Right greater than left foraminal narrowing. At L5-S1, maintained disc space. No canal stenosis. Foramen are patent bilaterally. IMPRESSION: 1. Chronic treated compression fractures at T11, L1 and L2. 2. Acute to subacute right transverse process fractures at L1 and L2 3. Scoliosis and degenerative changes of the lumbar spine Electronically Signed   By: Donavan Foil M.D.   On: 04/04/2021 20:45   DG Chest Portable 1 View  Result Date: 04/04/2021 CLINICAL DATA:  Fall. EXAM: PORTABLE CHEST 1 VIEW COMPARISON:  Chest radiograph dated 03/26/2021. FINDINGS: Patchy area of diffuse density primarily involving the right upper lobe as well as areas of increased interstitial and hazy density in the left lung may represent parenchymal crowding related to shallow inspiration. Developing infiltrate is not excluded. Clinical correlation is recommended. No pleural effusion pneumothorax. The cardiac silhouette is within limits. Atherosclerotic calcification of the aorta. Osteopenia with degenerative changes of  the spine. No acute osseous pathology. Multilevel vertebroplasties. IMPRESSION: Areas of possible atelectasis. Developing infiltrate is not excluded. Clinical correlation is recommended. Electronically Signed   By: Anner Crete M.D.   On: 04/04/2021 19:21   DG Foot 2 Views Left  Result Date: 04/04/2021 CLINICAL DATA:  Fall.  Left foot pain. EXAM: LEFT FOOT - 2 VIEW COMPARISON:  None. FINDINGS: There is no acute fracture or dislocation. The bones are osteopenic. Old healed fracture deformity of the fifth metatarsal. The soft tissues are grossly unremarkable. No radiopaque foreign object or soft tissue gas. IMPRESSION: 1. No acute fracture or dislocation. 2. Osteopenia. Electronically Signed   By: Anner Crete M.D.   On: 04/04/2021 21:38   DG Hip Unilat W or Wo Pelvis 2-3 Views Left  Result Date: 04/04/2021 CLINICAL DATA:  Status post fall.  Concern for fracture. EXAM: DG HIP (WITH OR WITHOUT PELVIS) 2-3V LEFT COMPARISON:  None. FINDINGS: There is no evidence of hip fracture or dislocation. Osteoarthritic changes of bilateral hips. IMPRESSION: No acute fracture or dislocation identified about the left hip. Electronically Signed   By: Fidela Salisbury M.D.   On: 04/04/2021 19:14    Assessment/Plan Closed trimalleolar fracture of right ankle with routine healing, subsequent encounter NWB Will start working with therapy On only tylenol for pain control  Off tramadol now On Aspirin for DVT prophylaxis Follow with Dr Mardelle Matte  Lethargy Mental status at baseline off Tramadol CT in ED was negative Discussed with son. He does not want And other Pain meds beside Tylenol for now Cannot do Advil due to her anemia and Low Platelets Will also Continue Neurontin Pancytopenia ([place]) She has had work up for this by Dr Alen Blew in the past He said no BM biopsy due to her age We will continue to follow and support her  Acute cystitis without hematuria Finishing Keflex Essential hypertension On  Losartan  Depression with anxiety On Wellbutrin and Zoloft Also on Ativan PRn  Acquired hypothyroidism TSH normal in 11/21 Chronic constipation On Miralax and Senna Stage 3a chronic kidney disease (HCC) Creat stable Edema On Demadex Creat stable Low POX in ED Will check her POX at night when she is sleeping  ? If continues rule out Sleep  apnea Low Back pain On Tylenol and Neurontin   Family/ staff Communication:   Labs/tests ordered:  Discussed in details with the son and Daughter in [employer]

## 2021-04-06 NOTE — Assessment & Plan Note (Signed)
takes Sertraline, Alprazolam

## 2021-04-06 NOTE — Assessment & Plan Note (Signed)
S/p ORIF right ankle 9/2 by Dr. Dion Saucier, NWB, ASA 81mg  bid x 30days, Tylenol for pain

## 2021-04-06 NOTE — Progress Notes (Signed)
Location:   [facility] Room Number: 35 A Place of Service:  SNF (31) Provider:  Marquinn Meschke X, NP  Virgie Dad, MD  Patient Care Team: Virgie Dad, MD as PCP - General (Internal Medicine) Aletheia Tangredi X, NP as Nurse Practitioner (Internal Medicine)  Extended Emergency Contact Information Primary Emergency Contact: Donson,[name] Address: [address]          [place], [place] [address] [place] Phone: [phone] Mobile Phone: [phone] Relation: Son Secondary Emergency Contact: [name] [place] Phone: [phone] Mobile Phone: [phone] Relation: Other  Code Status:  DNR Goals of care: Advanced Directive information Advanced Directives 04/06/2021  Does Patient Have a Medical Advance Directive? Yes  Type of Paramedic of [place];Out of facility DNR (pink MOST or yellow form)  Does patient want to make changes to medical advance directive? No - Patient declined  Copy of [place] in Chart? Yes - validated most recent copy scanned in chart (See row information)  Pre-existing out of facility DNR order (yellow form or pink MOST form) Yellow form placed in chart (order not valid for inpatient use);Pink MOST form placed in chart (order not valid for inpatient use)     Chief Complaint  Patient presents with   Acute Visit    ED follow up    HPI:  Pt is a 87 y.o. female seen today for an acute visit for ED evaluation 04/04/21 for fall, resulted in closed fracture of transverse process of lumbar vertebra L1, L2 CT lumbar 04/04/21  S/p ORIF right ankle 9/2 by Dr. Mardelle Matte, NWB, ASA 19m bid x 30days, Tylenol for pain CKD, stage 3 Bun/creat 27/0.98, eGFR 56 04/04/21             Hx of recurrent UTI, treated with Keflex x 5 days since 04/01/21             HTN, blood pressure is controlled on Losartan 516mqd.                      Hemorrhoids, no injury.                            No constipation while taking prn MiraLax, Fiber daily, Senna II since 04/01/21             Anemia, takes Fe 3x/wk, Hgb 8.2 04/04/21, recommended hematology eval             Lower back pain, takes Gabapentin 10076mid. 10/16/20 X-ray Vertebroplasty of T9, T11, L1, and L2 have been performed with stable compression deformities             Hypothyroidism, takes Levothyroxine 66m79md, TSH 3.82 03/17/21             GERD, takes Omeprazole 40mg63m            Edema BLE, minimal, takes Torsemide             Depression, takes Sertraline, Alprazolam  Past Medical History:  Diagnosis Date   Anemia    Anxiety    Chronotropic incompetence 12/2012    potentially medication related; noted on CPET    DDD (degenerative disc disease)    Eczema    GERD (gastroesophageal reflux disease)    H/O hiatal hernia    Hx: UTI (urinary tract infection)  Hyperlipidemia    Hypertension    Hypothyroidism    Osteopenia    Septicemia ([place]) 2002   following UTI   Vertigo, benign positional    Past Surgical History:  Procedure Laterality Date   BREAST SURGERY     left biopsy   CATARACT EXTRACTION Right    2 weeks ago   CPET / MET - PFTS     Consistent with chronotropic incompetence; See attached report in the results section   DILATION AND CURETTAGE OF UTERUS     x 2   DOPPLER ECHOCARDIOGRAPHY  01/10/2013   Normal LV size and function. Normal EF. Air sclerosis but no stenosis   ESOPHAGOGASTRODUODENOSCOPY  05/04/2012   Procedure: ESOPHAGOGASTRODUODENOSCOPY (EGD);  Surgeon: Inda Castle, MD;  Location: [facility];  Service: Endoscopy;  Laterality: N/A;   EYE SURGERY     cataract extraction with ILO  ? eye   IR KYPHO EA ADDL LEVEL THORACIC OR LUMBAR  10/28/2016   IR KYPHO LUMBAR INC FX REDUCE BONE BX UNI/BIL CANNULATION INC/IMAGING  10/28/2016   IR KYPHO THORACIC WITH BONE BIOPSY  12/24/2016   IR RADIOLOGIST EVAL & MGMT  11/11/2016   KNEE ARTHROSCOPY  2011   right   ORIF ANKLE FRACTURE Right 03/27/2021    Procedure: OPEN REDUCTION INTERNAL FIXATION (ORIF) ANKLE FRACTURE;  Surgeon: Marchia Bond, MD;  Location: [facility];  Service: Orthopedics;  Laterality: Right;   TONSILLECTOMY     TOTAL KNEE ARTHROPLASTY  04/24/2012   Procedure: TOTAL KNEE ARTHROPLASTY;  Surgeon: Gearlean Alf, MD;  Location: [facility];  Service: Orthopedics;  Laterality: Right;   TRANSTHORACIC ECHOCARDIOGRAM  02/2018   Ordered by PCP for "congestive heart failure " -EF 60 M 65%.  GR 1 DD.  No R WMA--- > ESSENTIALLY NORMAL    No Known Allergies  Allergies as of 04/06/2021   No Known Allergies      Medication List        Accurate as of April 06, 2021 11:27 AM. If you have any questions, ask your nurse or doctor.          STOP taking these medications    traMADol 50 MG tablet Commonly known as: ULTRAM Stopped by: Deontrey Massi X Cross Jorge, NP       TAKE these medications    acetaminophen 500 MG tablet Commonly known as: TYLENOL Take 1,000 mg by mouth 3 (three) times daily as needed for mild pain.   ALPRAZolam 0.25 MG tablet Commonly known as: XANAX Take 1 tablet (0.25 mg total) by mouth at bedtime as needed for sleep or anxiety. What changed: when to take this   aspirin EC 81 MG tablet Take 1 tablet (81 mg total) by mouth 2 (two) times daily. For DVT prophylaxis for 30 days after surgery.   atorvastatin 10 MG tablet Commonly known as: LIPITOR Take 5 mg by mouth at bedtime.   buPROPion 150 MG 12 hr tablet Commonly known as: WELLBUTRIN SR Take 1 tablet (150 mg total) by mouth 2 (two) times daily after a meal.   calcium citrate-vitamin D 315-200 MG-UNIT tablet Commonly known as: CITRACAL+D Take 1 tablet by mouth 2 (two) times daily.   Camphor-Menthol-Methyl Sal 3.07-31-08 % Ptch Place 1 patch onto the skin See admin instructions. Apply one patch onto the skin of the  lower back daily for back pain   ferrous sulfate 325 (65 FE) MG EC tablet Take 325 mg by mouth every Monday, Wednesday, and Friday. With a  meal  fish oil-omega-3 fatty acids 1000 MG capsule Take 1 g by mouth every morning.   gabapentin 100 MG capsule Commonly known as: NEURONTIN Take 100 mg by mouth 3 (three) times daily.   hydrocortisone cream 1 % Apply 1 application topically daily as needed for itching.   levothyroxine 75 MCG tablet Commonly known as: SYNTHROID Take 75 mcg by mouth every morning.   losartan 25 MG tablet Commonly known as: COZAAR Take 25 mg by mouth every morning.   nystatin powder Commonly known as: MYCOSTATIN/NYSTOP Apply 1 application topically 2 (two) times daily as needed (yeast).   omeprazole 40 MG capsule Commonly known as: PRILOSEC Take 40 mg by mouth every morning.   polyethylene glycol 17 g packet Commonly known as: MIRALAX / GLYCOLAX Take 17 g by mouth daily as needed (constipation).   polyvinyl alcohol 1.4 % ophthalmic solution Commonly known as: LIQUIFILM TEARS Place 1 drop into both eyes 3 (three) times daily.   potassium chloride SA 20 MEQ tablet Commonly known as: KLOR-CON Take 20 mEq by mouth 2 (two) times daily.   senna 8.6 MG Tabs tablet Commonly known as: SENOKOT Take 2 tablets by mouth at bedtime.   sertraline 25 MG tablet Commonly known as: ZOLOFT Take 75 mg by mouth every morning.   torsemide 20 MG tablet Commonly known as: DEMADEX Take 20 mg by mouth every morning.   zinc oxide 20 % ointment Apply 1 application topically See admin instructions. Apply topically to per/buttocks after every incontinent episode and as needed for redness        Review of Systems  Constitutional:  Negative for appetite change, fatigue and fever.       Chronic fatigue.   HENT:  Positive for hearing loss. Negative for congestion and voice change.   Eyes:  Negative for visual disturbance.  Respiratory:  Positive for shortness of breath. Negative for cough and wheezing.        DOE  Cardiovascular:  Positive for leg swelling. Negative for palpitations.  Gastrointestinal:   Negative for abdominal pain and constipation.  Genitourinary:  Negative for dysuria and urgency.       Urinary leakage.   Musculoskeletal:  Positive for arthralgias, back pain and gait problem. Negative for joint swelling.       R hip pain, lower back pain. Unsteady gait. Frequent falls, right ankle s/p ORIF NWB  Skin:  Negative for color change.  Neurological:  Negative for speech difficulty, weakness and headaches.       Hx of vertigo, chronic. Memory lapses.   Psychiatric/Behavioral:  Negative for behavioral problems and sleep disturbance. The patient is not nervous/anxious.    Immunization History  Administered Date(s) Administered   Influenza, High Dose Seasonal PF 04/26/2016, 03/22/2017, 05/08/2019   Influenza,inj,Quad PF,6+ Mos 04/27/2018   Influenza-Unspecified 04/22/2011, 04/04/2012, 05/03/2013, 05/04/2013, 04/17/2014, 04/19/2015, 04/26/2016, 04/22/2017   Moderna SARS-COV2 Booster Vaccination 06/09/2020, 12/31/2020   Moderna Sars-Covid-2 Vaccination 07/30/2019, 08/27/2019   Pneumococcal Polysaccharide-23 08/26/2003, 09/06/2011, 05/15/2019   Pneumococcal-Unspecified 12/20/2013   Td 09/26/2006   Tdap 05/15/2019   Zoster Recombinat (Shingrix) 11/21/2017, 04/08/2018   Zoster, Live 10/25/2007   Pertinent  Health Maintenance Due  Topic Date Due   PNA vac Low Risk Adult (2 of 2 - PCV13) 05/14/2020   INFLUENZA VACCINE  02/23/2021   DEXA SCAN  Completed   Fall Risk  10/18/2018 08/23/2018 05/09/2018 05/01/2018 04/10/2018  Falls in the past year? 0 0 Yes Yes No  Comment - - - [facility]/2019 [facility] -  Number falls in past yr: 0 0 1 1 -  Injury with Fall? 0 0 No No -  Risk for fall due to : - - - Impaired balance/gait -  Follow up - - - Falls prevention discussed -   Functional Status Survey:    Vitals:   04/06/21 1008  BP: (!) 153/67  Pulse: 68  Resp: 14  Temp: (!) 97.5 F (36.4 C)  SpO2: 92%  Weight: 182 lb 1.9 oz (82.6 kg)  Height: '5\' 4"'  (1.626 m)    Body mass index is 31.26 kg/m. Physical Exam Vitals and nursing note reviewed.  Constitutional:      Appearance: Normal appearance.  HENT:     Head: Normocephalic and atraumatic.     Mouth/Throat:     Mouth: Mucous membranes are moist.  Eyes:     Extraocular Movements: Extraocular movements intact.     Conjunctiva/sclera: Conjunctivae normal.     Pupils: Pupils are equal, round, and reactive to light.  Cardiovascular:     Rate and Rhythm: Normal rate and regular rhythm.     Heart sounds: No murmur heard. Pulmonary:     Breath sounds: No wheezing.  Abdominal:     General: Bowel sounds are normal.     Palpations: Abdomen is soft.     Tenderness: There is no abdominal tenderness.  Genitourinary:    Comments: External hemorrhoid per previous examination.  Musculoskeletal:     Cervical back: Normal range of motion and neck supple.     Right lower leg: Edema present.     Left lower leg: Edema present.     Comments: Edema trace BLE. Chronic lower back, hip pain, s/p R ankle ORIF, NWB  Skin:    General: Skin is warm and dry.  Neurological:     General: No focal deficit present.     Mental Status: She is alert. Mental status is at baseline.     Motor: No weakness.     Coordination: Coordination normal.     Gait: Gait abnormal.     Comments: Oriented to person, place.   Psychiatric:        Mood and Affect: Mood normal.        Behavior: Behavior normal.        Thought Content: Thought content normal.    Labs reviewed: Recent Labs    03/28/21 0547 03/30/21 0503 04/04/21 1827  NA 137 137 145  K 4.0 3.5 4.5  CL 104 100 103  CO2 '28 31 31  ' GLUCOSE 117* 101* 104*  BUN 22 36* 27*  CREATININE 1.00 1.31* 0.98  CALCIUM 9.2 8.9 9.7   Recent Labs    06/05/20 0000 03/27/21 0543 04/04/21 1827  AST 20 21 37  ALT '14 16 24  ' ALKPHOS 94 69 95  BILITOT  --  0.4 0.5  PROT  --  7.4 7.9  ALBUMIN 3.9 3.7 3.2*   Recent Labs    06/05/20 0000 10/16/20 1804 03/26/21 0334  03/27/21 0543 03/30/21 0503 03/31/21 0444 04/04/21 1827  WBC 4.4   < > 11.0*   < > 10.7* 9.3 10.3  NEUTROABS 2,213.00  --  8.6*  --   --   --  8.0*  HGB 9.7*   < > 10.0*   < > 7.6* 8.1* 8.2*  HCT 31*   < > 33.7*   < > 24.6* 26.1* 27.8*  MCV  --    < > 91.6   < >  83.4 82.9 85.3  PLT 77*   < > 46*   < > 83* 97* 226   < > = values in this interval not displayed.   Lab Results  Component Value Date   TSH 4.69 06/05/2020   No results found for: HGBA1C Lab Results  Component Value Date   CHOL 154 04/10/2020   HDL 47 04/10/2020   LDLCALC 87 04/10/2020   TRIG 108 04/10/2020   CHOLHDL 3.8 11/23/2018    Significant Diagnostic Results in last 30 days:  CT ABDOMEN PELVIS WO CONTRAST  Result Date: 03/26/2021 CLINICAL DATA:  Abdominal trauma.  Back pain after fall last night EXAM: CT ABDOMEN AND PELVIS WITHOUT CONTRAST TECHNIQUE: Multidetector CT imaging of the abdomen and pelvis was performed following the standard protocol without IV contrast. COMPARISON:  01/14/2018 FINDINGS: Lower chest: Atheromatous calcification of the aorta and coronaries. Small sliding hiatal hernia. Hepatobiliary: No focal liver abnormality.No evidence of biliary obstruction or stone. Pancreas: 2 cm cystic density projecting posteriorly from the pancreas, non progressed and non worrisome given patient age. No visible injury. Spleen: Unremarkable. Adrenals/Urinary Tract: Negative adrenals. No hydronephrosis or stone. Unremarkable bladder with moderate distension. Stomach/Bowel:  No obstruction. No visible injury or inflammation. Vascular/Lymphatic: Diffuse atheromatous calcification of the aorta, iliacs, and branch vessels. No acute vascular finding. No mass or adenopathy. Reproductive:No pathologic findings. Other: No ascites or pneumoperitoneum. Musculoskeletal: No acute finding. Prior T9, T11, L1, and L2 compression fractures with cement augmentation. Generalized spinal degeneration with lumbar dextroscoliosis. IMPRESSION:  1. No acute or interval finding. 2. Chronic findings are stable from 2019 and described above. Electronically Signed   By: Monte Fantasia M.D.   On: 03/26/2021 07:39   DG Chest 1 View  Result Date: 03/26/2021 CLINICAL DATA:  Fall. EXAM: CHEST  1 VIEW COMPARISON:  Chest radiograph dated 01/14/2018 FINDINGS: Evaluation is limited due to patient's positioning and superimposition of the mandible over the right upper lobe. No focal consolidation, pleural effusion pneumothorax. Stable cardiac silhouette. No acute osseous pathology. Osteopenia with degenerative changes of the spine. Multilevel thoracic vertebroplasty. IMPRESSION: No acute cardiopulmonary process. Electronically Signed   By: Anner Crete M.D.   On: 03/26/2021 02:45   DG Ankle Complete Right  Result Date: 03/26/2021 CLINICAL DATA:  Fall and right lower extremity pain. EXAM: RIGHT KNEE - COMPLETE 4+ VIEW; RIGHT ANKLE - COMPLETE 3+ VIEW COMPARISON:  None. FINDINGS: There is a mildly displaced fracture of the medial malleolus. Minimally displaced fracture of the lateral malleolus. There is a nondisplaced fracture of the posterior malleolus. No other acute fracture. The bones are osteopenic. There is no dislocation. There is a total right knee arthroplasty. The arthroplasty components appear intact and in anatomic alignment. There is diffuse subcutaneous edema. IMPRESSION: 1. Trimalleolar fracture.  No dislocation. 2. Right knee arthroplasty appears intact. Electronically Signed   By: Anner Crete M.D.   On: 03/26/2021 02:44   CT HEAD WO CONTRAST (5MM)  Result Date: 04/04/2021 CLINICAL DATA:  Head trauma.  Altered mental status. EXAM: CT HEAD WITHOUT CONTRAST TECHNIQUE: Contiguous axial images were obtained from the base of the skull through the vertex without intravenous contrast. COMPARISON:  March 26, 2021 FINDINGS: Brain: No evidence of acute infarction, hemorrhage, hydrocephalus, extra-axial collection or mass lesion/mass effect.  Marked brain parenchymal volume loss and deep white matter microangiopathy. Vascular: No hyperdense vessel or unexpected calcification. Skull: Normal. Negative for fracture or focal lesion. Sinuses/Orbits: No acute finding. Other: None. IMPRESSION: 1. No acute intracranial abnormality. 2. Marked  brain parenchymal atrophy and chronic microvascular disease. Electronically Signed   By: Fidela Salisbury M.D.   On: 04/04/2021 18:51   CT HEAD WO CONTRAST (5MM)  Result Date: 03/26/2021 CLINICAL DATA:  Headache, new or worsening (Age >= 50y); Neck trauma (Age >= 65y) Neck trauma (Age >= 65y) EXAM: CT HEAD WITHOUT CONTRAST CT CERVICAL SPINE WITHOUT CONTRAST TECHNIQUE: Multidetector CT imaging of the head and cervical spine was performed following the standard protocol without intravenous contrast. Multiplanar CT image reconstructions of the cervical spine were also generated. COMPARISON:  January 14, 2018. FINDINGS: CT HEAD FINDINGS Brain: No evidence of acute infarction, hemorrhage, hydrocephalus, extra-axial collection or mass lesion/mass effect. Mild to moderate for age patchy and confluent white matter hypoattenuation, nonspecific but compatible with chronic microvascular ischemic disease. Mild for age atrophy with ex vacuo ventricular dilation. Partially empty sella. Vascular: Calcific intracranial atherosclerosis. No hyperdense vessel identified. Skull: No acute fracture. Sinuses/Orbits: Clear sinuses.  Unremarkable orbits. Other: No mastoid effusions. CT CERVICAL SPINE FINDINGS Alignment: Similar alignment. Similar mild anterolisthesis of C7 on T1, likely degenerative given stability and facet arthropathy at this level. Skull base and vertebrae: No evidence of acute fracture. Similar vertebral body heights. Osteopenia. Soft tissues and spinal canal: No prevertebral fluid or swelling. No visible canal hematoma. Disc levels: Similar moderate degenerative change at C5-C6 and C6-C7. Similar likely moderate foraminal  stenosis at multiple levels due to facet/uncovertebral hypertrophy. Upper chest: Biapical pleuroparenchymal scarring and mild dependent probable atelectasis. Otherwise, lung apices are clear. Other: Calcific atherosclerosis. IMPRESSION: CT Head 1. No evidence of acute intracranial abnormality. 2. Mild-to-moderate chronic microvascular ischemic disease and atrophy. CT Cervical Spine 1. No evidence of acute fracture or traumatic malalignment. 2. Similar moderate degenerative change at C5-C6 and C6-C7 and multilevel facet arthropathy with likely moderate multilevel foraminal stenosis. Electronically Signed   By: Margaretha Sheffield M.D.   On: 03/26/2021 07:37   CT Cervical Spine Wo Contrast  Result Date: 04/04/2021 CLINICAL DATA:  Head trauma, fall. Patient in a cervical spine collar. EXAM: CT CERVICAL SPINE WITHOUT CONTRAST TECHNIQUE: Multidetector CT imaging of the cervical spine was performed without intravenous contrast. Multiplanar CT image reconstructions were also generated. COMPARISON:  March 26, 2021. FINDINGS: Alignment: Accentuation of normal cervical lordotic curvature is unchanged compared to recent imaging in the setting of multilevel degenerative change. Skull base and vertebrae: No acute fracture. No primary bone lesion or focal pathologic process. Soft tissues and spinal canal: No prevertebral fluid or swelling. No visible canal hematoma. Disc levels: Multilevel spinal degenerative changes are unchanged compared to prior imaging greatest in the mid cervical spine with respect to disc space narrowing and greatest in the upper to mid cervical spine with respect to facet arthropathy Upper chest: Biapical scarring similar to prior imaging. Other: None IMPRESSION: No acute fracture or static subluxation of the cervical spine. Multilevel spinal degenerative changes are unchanged compared to prior imaging. Electronically Signed   By: Zetta Bills M.D.   On: 04/04/2021 18:56   CT Cervical Spine Wo  Contrast  Result Date: 03/26/2021 CLINICAL DATA:  Headache, new or worsening (Age >= 50y); Neck trauma (Age >= 65y) Neck trauma (Age >= 65y) EXAM: CT HEAD WITHOUT CONTRAST CT CERVICAL SPINE WITHOUT CONTRAST TECHNIQUE: Multidetector CT imaging of the head and cervical spine was performed following the standard protocol without intravenous contrast. Multiplanar CT image reconstructions of the cervical spine were also generated. COMPARISON:  January 14, 2018. FINDINGS: CT HEAD FINDINGS Brain: No evidence of acute infarction, hemorrhage, hydrocephalus,  extra-axial collection or mass lesion/mass effect. Mild to moderate for age patchy and confluent white matter hypoattenuation, nonspecific but compatible with chronic microvascular ischemic disease. Mild for age atrophy with ex vacuo ventricular dilation. Partially empty sella. Vascular: Calcific intracranial atherosclerosis. No hyperdense vessel identified. Skull: No acute fracture. Sinuses/Orbits: Clear sinuses.  Unremarkable orbits. Other: No mastoid effusions. CT CERVICAL SPINE FINDINGS Alignment: Similar alignment. Similar mild anterolisthesis of C7 on T1, likely degenerative given stability and facet arthropathy at this level. Skull base and vertebrae: No evidence of acute fracture. Similar vertebral body heights. Osteopenia. Soft tissues and spinal canal: No prevertebral fluid or swelling. No visible canal hematoma. Disc levels: Similar moderate degenerative change at C5-C6 and C6-C7. Similar likely moderate foraminal stenosis at multiple levels due to facet/uncovertebral hypertrophy. Upper chest: Biapical pleuroparenchymal scarring and mild dependent probable atelectasis. Otherwise, lung apices are clear. Other: Calcific atherosclerosis. IMPRESSION: CT Head 1. No evidence of acute intracranial abnormality. 2. Mild-to-moderate chronic microvascular ischemic disease and atrophy. CT Cervical Spine 1. No evidence of acute fracture or traumatic malalignment. 2. Similar  moderate degenerative change at C5-C6 and C6-C7 and multilevel facet arthropathy with likely moderate multilevel foraminal stenosis. Electronically Signed   By: Margaretha Sheffield M.D.   On: 03/26/2021 07:37   CT Lumbar Spine Wo Contrast  Result Date: 04/04/2021 CLINICAL DATA:  Fall back pain EXAM: CT LUMBAR SPINE WITHOUT CONTRAST TECHNIQUE: Multidetector CT imaging of the lumbar spine was performed without intravenous contrast administration. Multiplanar CT image reconstructions were also generated. COMPARISON:  Radiograph 10/16/2020, CT 10/24/2016, CT 03/26/2021 FINDINGS: Segmentation: 5 lumbar type vertebrae. Alignment: S shaped scoliosis. Sagittal alignment within normal limits. Vertebrae: Treated compression deformities at T11, L1 and L2. Remaining vertebra demonstrate normal stature. Subacute appearing fracture right transverse process at L1, acute to subacute right transverse process fracture at L2. Paraspinal and other soft tissues: No paravertebral or paraspinal soft tissue abnormality. Aortic atherosclerosis. Fatty atrophy of paraspinous muscles. Disc levels: At T12-L1, maintained disc space.  No canal stenosis. At L1-L2, maintained disc space. No canal stenosis. The foramen patent. At L2-L3, maintained disc space. Diffuse disc bulge, asymmetric to the left. No canal stenosis. No high-grade foraminal narrowing. At L3-L4, maintained disc space. No canal stenosis. Facet degenerative changes. No high-grade foraminal narrowing. At L4-L5, moderate to marked disc space narrowing. Diffuse disc bulge. Ligamentum flavum thickening and facet degeneration with mild canal stenosis. Right greater than left foraminal narrowing. At L5-S1, maintained disc space. No canal stenosis. Foramen are patent bilaterally. IMPRESSION: 1. Chronic treated compression fractures at T11, L1 and L2. 2. Acute to subacute right transverse process fractures at L1 and L2 3. Scoliosis and degenerative changes of the lumbar spine  Electronically Signed   By: Donavan Foil M.D.   On: 04/04/2021 20:45   DG Chest Portable 1 View  Result Date: 04/04/2021 CLINICAL DATA:  Fall. EXAM: PORTABLE CHEST 1 VIEW COMPARISON:  Chest radiograph dated 03/26/2021. FINDINGS: Patchy area of diffuse density primarily involving the right upper lobe as well as areas of increased interstitial and hazy density in the left lung may represent parenchymal crowding related to shallow inspiration. Developing infiltrate is not excluded. Clinical correlation is recommended. No pleural effusion pneumothorax. The cardiac silhouette is within limits. Atherosclerotic calcification of the aorta. Osteopenia with degenerative changes of the spine. No acute osseous pathology. Multilevel vertebroplasties. IMPRESSION: Areas of possible atelectasis. Developing infiltrate is not excluded. Clinical correlation is recommended. Electronically Signed   By: Anner Crete M.D.   On: 04/04/2021 19:21  DG Knee Complete 4 Views Left  Result Date: 03/29/2021 CLINICAL DATA:  Left knee pain after fall 3 days ago. EXAM: LEFT KNEE - COMPLETE 4+ VIEW COMPARISON:  None. FINDINGS: No evidence of fracture, dislocation, or joint effusion. Mild tricompartmental peripheral spurring. There is medial and lateral tibiofemoral chondrocalcinosis. Small quadriceps tendon enthesophyte. Soft tissues are unremarkable. IMPRESSION: 1. No acute fracture or subluxation of the left knee. 2. Mild tricompartmental osteoarthritis with chondrocalcinosis. Electronically Signed   By: Keith Rake M.D.   On: 03/29/2021 15:12   DG Knee Complete 4 Views Right  Result Date: 03/26/2021 CLINICAL DATA:  Fall and right lower extremity pain. EXAM: RIGHT KNEE - COMPLETE 4+ VIEW; RIGHT ANKLE - COMPLETE 3+ VIEW COMPARISON:  None. FINDINGS: There is a mildly displaced fracture of the medial malleolus. Minimally displaced fracture of the lateral malleolus. There is a nondisplaced fracture of the posterior malleolus. No  other acute fracture. The bones are osteopenic. There is no dislocation. There is a total right knee arthroplasty. The arthroplasty components appear intact and in anatomic alignment. There is diffuse subcutaneous edema. IMPRESSION: 1. Trimalleolar fracture.  No dislocation. 2. Right knee arthroplasty appears intact. Electronically Signed   By: Anner Crete M.D.   On: 03/26/2021 02:44   DG Ankle Right Port  Result Date: 03/26/2021 CLINICAL DATA:  Postreduction EXAM: PORTABLE RIGHT ANKLE - 2 VIEW COMPARISON:  Earlier today FINDINGS: Acute trimalleolar fracture with mild posterior displacement at the distal fibula. No change in alignment when compared to prior. Generalized osteopenia soft tissue swelling. IMPRESSION: Stable ankle fracture alignment after splinting. Electronically Signed   By: Monte Fantasia M.D.   On: 03/26/2021 05:46   DG Foot 2 Views Left  Result Date: 04/04/2021 CLINICAL DATA:  Fall.  Left foot pain. EXAM: LEFT FOOT - 2 VIEW COMPARISON:  None. FINDINGS: There is no acute fracture or dislocation. The bones are osteopenic. Old healed fracture deformity of the fifth metatarsal. The soft tissues are grossly unremarkable. No radiopaque foreign object or soft tissue gas. IMPRESSION: 1. No acute fracture or dislocation. 2. Osteopenia. Electronically Signed   By: Anner Crete M.D.   On: 04/04/2021 21:38   DG Hip Unilat W or Wo Pelvis 2-3 Views Left  Result Date: 04/04/2021 CLINICAL DATA:  Status post fall.  Concern for fracture. EXAM: DG HIP (WITH OR WITHOUT PELVIS) 2-3V LEFT COMPARISON:  None. FINDINGS: There is no evidence of hip fracture or dislocation. Osteoarthritic changes of bilateral hips. IMPRESSION: No acute fracture or dislocation identified about the left hip. Electronically Signed   By: Fidela Salisbury M.D.   On: 04/04/2021 19:14   DG Hip Unilat With Pelvis 2-3 Views Right  Result Date: 03/26/2021 CLINICAL DATA:  Fall and right hip pain. EXAM: DG HIP (WITH OR WITHOUT  PELVIS) 2-3V RIGHT COMPARISON:  Right hip radiograph dated 10/16/2020 FINDINGS: There is no acute fracture or dislocation. The bones are osteopenic. Mild bilateral hip arthritic changes. The soft tissues are unremarkable. IMPRESSION: No acute fracture or dislocation. Electronically Signed   By: Anner Crete M.D.   On: 03/26/2021 02:42    Assessment/Plan There are no diagnoses linked to this encounter.   Family/ staff Communication:   Labs/tests ordered:   This encounter was created in error - please disregard.

## 2021-04-06 NOTE — Assessment & Plan Note (Signed)
Hx of recurrent UTI, treated with Keflex x 5 days since 04/01/21

## 2021-04-06 NOTE — Assessment & Plan Note (Signed)
No constipation while taking prn MiraLax, Fiber daily, Senna II since 04/01/21

## 2021-04-06 NOTE — Assessment & Plan Note (Signed)
blood pressure is controlled on Losartan 50mg qd.  

## 2021-04-06 NOTE — Assessment & Plan Note (Signed)
minimal, takes Torsemide

## 2021-04-14 NOTE — Discharge Summary (Signed)
Physician Discharge Summary  [name] [name] QAS:[id number] DOB: [birth year] DOA: 03/26/2021  PCP: Mahlon Gammon, MD  Admit date: 03/26/2021 Discharge date: 04/14/2021  Time spent: 60 minutes  Recommendations for Outpatient Follow-up:  Follow-up orthopedics in 2 weeks Continue Keflex 500 mg p.o. twice daily for 5 more days till 04/05/2021   Discharge Diagnoses:  Active Problems:   Ankle fracture   Discharge Condition: Stable  Diet recommendation: Heart healthy diet  Filed Weights   03/26/21 0119 03/26/21 1645  Weight: 78.9 kg 82.9 kg    History of present illness:  87 year old female with history of anxiety, hyperlipidemia, hypertension, hypothyroidism presented with ankle pain after fall.  X-ray of ankle showed trimalleolar fracture.  Orthopedics was consulted for ORIF.    Hospital Course:   Right trimalleolar fracture -S/p ORIF -Follow-up orthopedics in 2 weeks     Pancytopenia -Unclear etiology -Follow CBC in a.m. -Peripheral smear shows normocytic anemia and thrombocytopenia -We will need hematology consultation as outpatient   UTI -Patient complains of dysuria, urine culture obtained which is growing E. coli, 20,000 colonies per mL -IV ceftriaxone has been started. -Urine culture grew E. coli, sensitive to ceftriaxone and cefazolin. -We will switch to Keflex 500 mg p.o. twice daily for 5 more days.   Anemia of  chronic disease -Hemoglobin was 6.6, patient received 1 unit PRBC -Today hemoglobin is up to 7.6 -Patient is on ferrous sulfate supplementation   Hypertension/chronic diastolic CHF -Blood pressure is stable -Continue Demadex -Continue potassium supplementation   Anxiety -Continue home regimen   CKD stage III -Creatinine at baseline   Hypothyroidism -Continue Synthroid    Procedures: ORIF for right trimalleolar fracture  Consultations: Orthopedic surgery  Discharge Exam: Vitals:   03/31/21 1217 03/31/21 1958  BP: (!) 147/42 (!) 142/52   Pulse: 63 93  Resp: 18 18  Temp: 98.6 F (37 C) 98.6 F (37 C)  SpO2: 95% 94%    General: Appears in no acute distress Cardiovascular: S1-S2, regular Respiratory: Clear to auscultation bilaterally  Discharge Instructions   Discharge Instructions     Diet - low sodium heart healthy   Complete by: As directed    Diet regular   Complete by: As directed    Increase activity slowly   Complete by: As directed    No wound care   Complete by: As directed       Allergies as of 04/01/2021   No Known Allergies      Medication List     STOP taking these medications    nitrofurantoin 50 MG capsule Commonly known as: MACRODANTIN   traMADol 50 MG tablet Commonly known as: ULTRAM       TAKE these medications    acetaminophen 500 MG tablet Commonly known as: TYLENOL Take 1,000 mg by mouth 3 (three) times daily as needed for mild pain.   ALPRAZolam 0.25 MG tablet Commonly known as: XANAX Take 1 tablet (0.25 mg total) by mouth at bedtime as needed for sleep or anxiety.   aspirin EC 81 MG tablet Take 1 tablet (81 mg total) by mouth 2 (two) times daily. For DVT prophylaxis for 30 days after surgery.   atorvastatin 10 MG tablet Commonly known as: LIPITOR Take 5 mg by mouth at bedtime.   buPROPion 150 MG 12 hr tablet Commonly known as: WELLBUTRIN SR Take 1 tablet (150 mg total) by mouth 2 (two) times daily after a meal.   calcium citrate-vitamin D 315-200 MG-UNIT tablet Commonly known as: CITRACAL+D Take  1 tablet by mouth 2 (two) times daily.   Camphor-Menthol-Methyl Sal 3.07-31-08 % Ptch Place 1 patch onto the skin See admin instructions. Apply one patch onto the skin of the  lower back daily for back pain   ferrous sulfate 325 (65 FE) MG EC tablet Take 325 mg by mouth every Monday, Wednesday, and Friday. With a meal   fish oil-omega-3 fatty acids 1000 MG capsule Take 1 g by mouth every morning.   gabapentin 100 MG capsule Commonly known as: NEURONTIN Take  100 mg by mouth 3 (three) times daily.   hydrocortisone cream 1 % Apply 1 application topically daily as needed for itching.   levothyroxine 75 MCG tablet Commonly known as: SYNTHROID Take 75 mcg by mouth every morning.   losartan 25 MG tablet Commonly known as: COZAAR Take 25 mg by mouth every morning.   nystatin powder Commonly known as: MYCOSTATIN/NYSTOP Apply 1 application topically 2 (two) times daily as needed (yeast). What changed: Another medication with the same name was removed. Continue taking this medication, and follow the directions you see here.   omeprazole 40 MG capsule Commonly known as: PRILOSEC Take 40 mg by mouth every morning.   polyethylene glycol 17 g packet Commonly known as: MIRALAX / GLYCOLAX Take 17 g by mouth daily as needed (constipation).   sertraline 25 MG tablet Commonly known as: ZOLOFT Take 75 mg by mouth every morning.   torsemide 20 MG tablet Commonly known as: DEMADEX Take 20 mg by mouth every morning.       ASK your doctor about these medications    cephALEXin 500 MG capsule Commonly known as: KEFLEX Take 1 capsule (500 mg total) by mouth 2 (two) times daily for 5 days. Ask about: Should I take this medication?       No Known Allergies  Contact information for follow-up providers     Teryl Lucy, MD. Schedule an appointment as soon as possible for a visit in 2 week(s).   Specialty: Orthopedic Surgery Contact information: [address] [place] [address] [phone]              Contact information for after-discharge care     Destination     [facility] .   Service: Skilled Nursing Contact information: [address] [place] [address] [phone]                      The results of significant diagnostics from this hospitalization (including imaging, microbiology, ancillary and laboratory) are listed below for reference.     Significant Diagnostic Studies: CT ABDOMEN PELVIS WO CONTRAST  Result Date: 03/26/2021 CLINICAL DATA:  Abdominal trauma.  Back pain after fall last night EXAM: CT ABDOMEN AND PELVIS WITHOUT CONTRAST TECHNIQUE: Multidetector CT imaging of the abdomen and pelvis was performed following the standard protocol without IV contrast. COMPARISON:  01/14/2018 FINDINGS: Lower chest: Atheromatous calcification of the aorta and coronaries. Small sliding hiatal hernia. Hepatobiliary: No focal liver abnormality.No evidence of biliary obstruction or stone. Pancreas: 2 cm cystic density projecting posteriorly from the pancreas, non progressed and non worrisome given patient age. No visible injury. Spleen: Unremarkable. Adrenals/Urinary Tract: Negative adrenals. No hydronephrosis or stone. Unremarkable bladder with moderate distension. Stomach/Bowel:  No obstruction. No visible injury or inflammation. Vascular/Lymphatic: Diffuse atheromatous calcification of the aorta, iliacs, and branch vessels. No acute vascular finding. No mass or adenopathy. Reproductive:No pathologic findings. Other: No ascites or pneumoperitoneum. Musculoskeletal: No  acute finding. Prior T9, T11, L1, and L2 compression fractures with cement augmentation. Generalized spinal degeneration with lumbar dextroscoliosis. IMPRESSION: 1. No acute or interval finding. 2. Chronic findings are stable from 2019 and described above. Electronically Signed   By: Marnee Spring M.D.   On: 03/26/2021 07:39   DG Chest 1 View  Result Date: 03/26/2021 CLINICAL DATA:  Fall. EXAM: CHEST  1 VIEW COMPARISON:  Chest radiograph dated 01/14/2018 FINDINGS: Evaluation is limited due to patient's positioning and superimposition of the mandible over the right upper lobe. No focal consolidation, pleural effusion pneumothorax. Stable cardiac silhouette. No acute osseous pathology. Osteopenia with degenerative changes of the spine. Multilevel thoracic vertebroplasty. IMPRESSION: No  acute cardiopulmonary process. Electronically Signed   By: Elgie Collard M.D.   On: 03/26/2021 02:45   DG Ankle Complete Right  Result Date: 03/26/2021 CLINICAL DATA:  Fall and right lower extremity pain. EXAM: RIGHT KNEE - COMPLETE 4+ VIEW; RIGHT ANKLE - COMPLETE 3+ VIEW COMPARISON:  None. FINDINGS: There is a mildly displaced fracture of the medial malleolus. Minimally displaced fracture of the lateral malleolus. There is a nondisplaced fracture of the posterior malleolus. No other acute fracture. The bones are osteopenic. There is no dislocation. There is a total right knee arthroplasty. The arthroplasty components appear intact and in anatomic alignment. There is diffuse subcutaneous edema. IMPRESSION: 1. Trimalleolar fracture.  No dislocation. 2. Right knee arthroplasty appears intact. Electronically Signed   By: Elgie Collard M.D.   On: 03/26/2021 02:44   CT HEAD WO CONTRAST ( )  Result Date: 04/04/2021 CLINICAL DATA:  Head trauma.  Altered mental status. EXAM: CT HEAD WITHOUT CONTRAST TECHNIQUE: Contiguous axial images were obtained from the base of the skull through the vertex without intravenous contrast. COMPARISON:  March 26, 2021 FINDINGS: Brain: No evidence of acute infarction, hemorrhage, hydrocephalus, extra-axial collection or mass lesion/mass effect. Marked brain parenchymal volume loss and deep white matter microangiopathy. Vascular: No hyperdense vessel or unexpected calcification. Skull: Normal. Negative for fracture or focal lesion. Sinuses/Orbits: No acute finding. Other: None. IMPRESSION: 1. No acute intracranial abnormality. 2. Marked brain parenchymal atrophy and chronic microvascular disease. Electronically Signed   By: Ted Mcalpine M.D.   On: 04/04/2021 18:51   CT HEAD WO CONTRAST ( )  Result Date: 03/26/2021 CLINICAL DATA:  Headache, new or worsening (Age >= 50y); Neck trauma (Age >= 65y) Neck trauma (Age >= 65y) EXAM: CT HEAD WITHOUT CONTRAST CT CERVICAL  SPINE WITHOUT CONTRAST TECHNIQUE: Multidetector CT imaging of the head and cervical spine was performed following the standard protocol without intravenous contrast. Multiplanar CT image reconstructions of the cervical spine were also generated. COMPARISON:  January 14, 2018. FINDINGS: CT HEAD FINDINGS Brain: No evidence of acute infarction, hemorrhage, hydrocephalus, extra-axial collection or mass lesion/mass effect. Mild to moderate for age patchy and confluent white matter hypoattenuation, nonspecific but compatible with chronic microvascular ischemic disease. Mild for age atrophy with ex vacuo ventricular dilation. Partially empty sella. Vascular: Calcific intracranial atherosclerosis. No hyperdense vessel identified. Skull: No acute fracture. Sinuses/Orbits: Clear sinuses.  Unremarkable orbits. Other: No mastoid effusions. CT CERVICAL SPINE FINDINGS Alignment: Similar alignment. Similar mild anterolisthesis of C7 on T1, likely degenerative given stability and facet arthropathy at this level. Skull base and vertebrae: No evidence of acute fracture. Similar vertebral body heights. Osteopenia. Soft tissues and spinal canal: No prevertebral fluid or swelling. No visible canal hematoma. Disc levels: Similar moderate degenerative change at C5-C6 and C6-C7. Similar likely moderate foraminal stenosis at multiple levels due  to facet/uncovertebral hypertrophy. Upper chest: Biapical pleuroparenchymal scarring and mild dependent probable atelectasis. Otherwise, lung apices are clear. Other: Calcific atherosclerosis. IMPRESSION: CT Head 1. No evidence of acute intracranial abnormality. 2. Mild-to-moderate chronic microvascular ischemic disease and atrophy. CT Cervical Spine 1. No evidence of acute fracture or traumatic malalignment. 2. Similar moderate degenerative change at C5-C6 and C6-C7 and multilevel facet arthropathy with likely moderate multilevel foraminal stenosis. Electronically Signed   By: Feliberto Harts M.D.    On: 03/26/2021 07:37   CT Cervical Spine Wo Contrast  Result Date: 04/04/2021 CLINICAL DATA:  Head trauma, fall. Patient in a cervical spine collar. EXAM: CT CERVICAL SPINE WITHOUT CONTRAST TECHNIQUE: Multidetector CT imaging of the cervical spine was performed without intravenous contrast. Multiplanar CT image reconstructions were also generated. COMPARISON:  March 26, 2021. FINDINGS: Alignment: Accentuation of normal cervical lordotic curvature is unchanged compared to recent imaging in the setting of multilevel degenerative change. Skull base and vertebrae: No acute fracture. No primary bone lesion or focal pathologic process. Soft tissues and spinal canal: No prevertebral fluid or swelling. No visible canal hematoma. Disc levels: Multilevel spinal degenerative changes are unchanged compared to prior imaging greatest in the mid cervical spine with respect to disc space narrowing and greatest in the upper to mid cervical spine with respect to facet arthropathy Upper chest: Biapical scarring similar to prior imaging. Other: None IMPRESSION: No acute fracture or static subluxation of the cervical spine. Multilevel spinal degenerative changes are unchanged compared to prior imaging. Electronically Signed   By: Donzetta Kohut M.D.   On: 04/04/2021 18:56   CT Cervical Spine Wo Contrast  Result Date: 03/26/2021 CLINICAL DATA:  Headache, new or worsening (Age >= 50y); Neck trauma (Age >= 65y) Neck trauma (Age >= 65y) EXAM: CT HEAD WITHOUT CONTRAST CT CERVICAL SPINE WITHOUT CONTRAST TECHNIQUE: Multidetector CT imaging of the head and cervical spine was performed following the standard protocol without intravenous contrast. Multiplanar CT image reconstructions of the cervical spine were also generated. COMPARISON:  January 14, 2018. FINDINGS: CT HEAD FINDINGS Brain: No evidence of acute infarction, hemorrhage, hydrocephalus, extra-axial collection or mass lesion/mass effect. Mild to moderate for age patchy and  confluent white matter hypoattenuation, nonspecific but compatible with chronic microvascular ischemic disease. Mild for age atrophy with ex vacuo ventricular dilation. Partially empty sella. Vascular: Calcific intracranial atherosclerosis. No hyperdense vessel identified. Skull: No acute fracture. Sinuses/Orbits: Clear sinuses.  Unremarkable orbits. Other: No mastoid effusions. CT CERVICAL SPINE FINDINGS Alignment: Similar alignment. Similar mild anterolisthesis of C7 on T1, likely degenerative given stability and facet arthropathy at this level. Skull base and vertebrae: No evidence of acute fracture. Similar vertebral body heights. Osteopenia. Soft tissues and spinal canal: No prevertebral fluid or swelling. No visible canal hematoma. Disc levels: Similar moderate degenerative change at C5-C6 and C6-C7. Similar likely moderate foraminal stenosis at multiple levels due to facet/uncovertebral hypertrophy. Upper chest: Biapical pleuroparenchymal scarring and mild dependent probable atelectasis. Otherwise, lung apices are clear. Other: Calcific atherosclerosis. IMPRESSION: CT Head 1. No evidence of acute intracranial abnormality. 2. Mild-to-moderate chronic microvascular ischemic disease and atrophy. CT Cervical Spine 1. No evidence of acute fracture or traumatic malalignment. 2. Similar moderate degenerative change at C5-C6 and C6-C7 and multilevel facet arthropathy with likely moderate multilevel foraminal stenosis. Electronically Signed   By: Feliberto Harts M.D.   On: 03/26/2021 07:37   CT Lumbar Spine Wo Contrast  Result Date: 04/04/2021 CLINICAL DATA:  Fall back pain EXAM: CT LUMBAR SPINE WITHOUT CONTRAST TECHNIQUE: Multidetector  CT imaging of the lumbar spine was performed without intravenous contrast administration. Multiplanar CT image reconstructions were also generated. COMPARISON:  Radiograph 10/16/2020, CT 10/24/2016, CT 03/26/2021 FINDINGS: Segmentation: 5 lumbar type vertebrae. Alignment: S  shaped scoliosis. Sagittal alignment within normal limits. Vertebrae: Treated compression deformities at T11, L1 and L2. Remaining vertebra demonstrate normal stature. Subacute appearing fracture right transverse process at L1, acute to subacute right transverse process fracture at L2. Paraspinal and other soft tissues: No paravertebral or paraspinal soft tissue abnormality. Aortic atherosclerosis. Fatty atrophy of paraspinous muscles. Disc levels: At T12-L1, maintained disc space.  No canal stenosis. At L1-L2, maintained disc space. No canal stenosis. The foramen patent. At L2-L3, maintained disc space. Diffuse disc bulge, asymmetric to the left. No canal stenosis. No high-grade foraminal narrowing. At L3-L4, maintained disc space. No canal stenosis. Facet degenerative changes. No high-grade foraminal narrowing. At L4-L5, moderate to marked disc space narrowing. Diffuse disc bulge. Ligamentum flavum thickening and facet degeneration with mild canal stenosis. Right greater than left foraminal narrowing. At L5-S1, maintained disc space. No canal stenosis. Foramen are patent bilaterally. IMPRESSION: 1. Chronic treated compression fractures at T11, L1 and L2. 2. Acute to subacute right transverse process fractures at L1 and L2 3. Scoliosis and degenerative changes of the lumbar spine Electronically Signed   By: Jasmine Pang M.D.   On: 04/04/2021 20:45   DG Chest Portable 1 View  Result Date: 04/04/2021 CLINICAL DATA:  Fall. EXAM: PORTABLE CHEST 1 VIEW COMPARISON:  Chest radiograph dated 03/26/2021. FINDINGS: Patchy area of diffuse density primarily involving the right upper lobe as well as areas of increased interstitial and hazy density in the left lung may represent parenchymal crowding related to shallow inspiration. Developing infiltrate is not excluded. Clinical correlation is recommended. No pleural effusion pneumothorax. The cardiac silhouette is within limits. Atherosclerotic calcification of the aorta.  Osteopenia with degenerative changes of the spine. No acute osseous pathology. Multilevel vertebroplasties. IMPRESSION: Areas of possible atelectasis. Developing infiltrate is not excluded. Clinical correlation is recommended. Electronically Signed   By: Elgie Collard M.D.   On: 04/04/2021 19:21   DG Knee Complete 4 Views Left  Result Date: 03/29/2021 CLINICAL DATA:  Left knee pain after fall 3 days ago. EXAM: LEFT KNEE - COMPLETE 4+ VIEW COMPARISON:  None. FINDINGS: No evidence of fracture, dislocation, or joint effusion. Mild tricompartmental peripheral spurring. There is medial and lateral tibiofemoral chondrocalcinosis. Small quadriceps tendon enthesophyte. Soft tissues are unremarkable. IMPRESSION: 1. No acute fracture or subluxation of the left knee. 2. Mild tricompartmental osteoarthritis with chondrocalcinosis. Electronically Signed   By: Narda Rutherford M.D.   On: 03/29/2021 15:12   DG Knee Complete 4 Views Right  Result Date: 03/26/2021 CLINICAL DATA:  Fall and right lower extremity pain. EXAM: RIGHT KNEE - COMPLETE 4+ VIEW; RIGHT ANKLE - COMPLETE 3+ VIEW COMPARISON:  None. FINDINGS: There is a mildly displaced fracture of the medial malleolus. Minimally displaced fracture of the lateral malleolus. There is a nondisplaced fracture of the posterior malleolus. No other acute fracture. The bones are osteopenic. There is no dislocation. There is a total right knee arthroplasty. The arthroplasty components appear intact and in anatomic alignment. There is diffuse subcutaneous edema. IMPRESSION: 1. Trimalleolar fracture.  No dislocation. 2. Right knee arthroplasty appears intact. Electronically Signed   By: Elgie Collard M.D.   On: 03/26/2021 02:44   DG Ankle Right Port  Result Date: 03/26/2021 CLINICAL DATA:  Postreduction EXAM: PORTABLE RIGHT ANKLE - 2 VIEW COMPARISON:  Earlier  today FINDINGS: Acute trimalleolar fracture with mild posterior displacement at the distal fibula. No change in  alignment when compared to prior. Generalized osteopenia soft tissue swelling. IMPRESSION: Stable ankle fracture alignment after splinting. Electronically Signed   By: Marnee Spring M.D.   On: 03/26/2021 05:46   DG Foot 2 Views Left  Result Date: 04/04/2021 CLINICAL DATA:  Fall.  Left foot pain. EXAM: LEFT FOOT - 2 VIEW COMPARISON:  None. FINDINGS: There is no acute fracture or dislocation. The bones are osteopenic. Old healed fracture deformity of the fifth metatarsal. The soft tissues are grossly unremarkable. No radiopaque foreign object or soft tissue gas. IMPRESSION: 1. No acute fracture or dislocation. 2. Osteopenia. Electronically Signed   By: Elgie Collard M.D.   On: 04/04/2021 21:38   DG Hip Unilat W or Wo Pelvis 2-3 Views Left  Result Date: 04/04/2021 CLINICAL DATA:  Status post fall.  Concern for fracture. EXAM: DG HIP (WITH OR WITHOUT PELVIS) 2-3V LEFT COMPARISON:  None. FINDINGS: There is no evidence of hip fracture or dislocation. Osteoarthritic changes of bilateral hips. IMPRESSION: No acute fracture or dislocation identified about the left hip. Electronically Signed   By: Ted Mcalpine M.D.   On: 04/04/2021 19:14   DG Hip Unilat With Pelvis 2-3 Views Right  Result Date: 03/26/2021 CLINICAL DATA:  Fall and right hip pain. EXAM: DG HIP (WITH OR WITHOUT PELVIS) 2-3V RIGHT COMPARISON:  Right hip radiograph dated 10/16/2020 FINDINGS: There is no acute fracture or dislocation. The bones are osteopenic. Mild bilateral hip arthritic changes. The soft tissues are unremarkable. IMPRESSION: No acute fracture or dislocation. Electronically Signed   By: Elgie Collard M.D.   On: 03/26/2021 02:42    Microbiology: No results found for this or any previous visit (from the past 240 hour(s)).    Labs: Basic Metabolic Panel: No results for input(s): NA, K, CL, CO2, GLUCOSE, BUN, CREATININE, CALCIUM, MG, PHOS in the last 168 hours.  Liver Function Tests: No results for input(s): AST,  ALT, ALKPHOS, BILITOT, PROT, ALBUMIN in the last 168 hours.  No results for input(s): LIPASE, AMYLASE in the last 168 hours. No results for input(s): AMMONIA in the last 168 hours. CBC: No results for input(s): WBC, NEUTROABS, HGB, HCT, MCV, PLT in the last 168 hours.      Signed:  Meredeth Ide MD.  [facility] 04/14/2021, 7:20 PM

## 2021-04-15 DIAGNOSIS — S82851D Displaced trimalleolar fracture of right lower leg, subsequent encounter for closed fracture with routine healing: Secondary | ICD-10-CM | POA: Diagnosis not present

## 2021-04-21 DIAGNOSIS — S82851D Displaced trimalleolar fracture of right lower leg, subsequent encounter for closed fracture with routine healing: Secondary | ICD-10-CM | POA: Diagnosis not present

## 2021-04-21 DIAGNOSIS — M545 Low back pain, unspecified: Secondary | ICD-10-CM | POA: Diagnosis not present

## 2021-04-21 DIAGNOSIS — Z9181 History of falling: Secondary | ICD-10-CM | POA: Diagnosis not present

## 2021-04-21 DIAGNOSIS — M6281 Muscle weakness (generalized): Secondary | ICD-10-CM | POA: Diagnosis not present

## 2021-04-22 DIAGNOSIS — Z9181 History of falling: Secondary | ICD-10-CM | POA: Diagnosis not present

## 2021-04-22 DIAGNOSIS — S82851D Displaced trimalleolar fracture of right lower leg, subsequent encounter for closed fracture with routine healing: Secondary | ICD-10-CM | POA: Diagnosis not present

## 2021-04-22 DIAGNOSIS — M545 Low back pain, unspecified: Secondary | ICD-10-CM | POA: Diagnosis not present

## 2021-04-22 DIAGNOSIS — M6281 Muscle weakness (generalized): Secondary | ICD-10-CM | POA: Diagnosis not present

## 2021-04-23 DIAGNOSIS — S82851D Displaced trimalleolar fracture of right lower leg, subsequent encounter for closed fracture with routine healing: Secondary | ICD-10-CM | POA: Diagnosis not present

## 2021-04-23 DIAGNOSIS — Z9181 History of falling: Secondary | ICD-10-CM | POA: Diagnosis not present

## 2021-04-23 DIAGNOSIS — M6281 Muscle weakness (generalized): Secondary | ICD-10-CM | POA: Diagnosis not present

## 2021-04-23 DIAGNOSIS — M545 Low back pain, unspecified: Secondary | ICD-10-CM | POA: Diagnosis not present

## 2021-04-27 DIAGNOSIS — M545 Low back pain, unspecified: Secondary | ICD-10-CM | POA: Diagnosis not present

## 2021-04-27 DIAGNOSIS — S82851D Displaced trimalleolar fracture of right lower leg, subsequent encounter for closed fracture with routine healing: Secondary | ICD-10-CM | POA: Diagnosis not present

## 2021-04-27 DIAGNOSIS — M6281 Muscle weakness (generalized): Secondary | ICD-10-CM | POA: Diagnosis not present

## 2021-04-27 DIAGNOSIS — Z9181 History of falling: Secondary | ICD-10-CM | POA: Diagnosis not present

## 2021-04-28 DIAGNOSIS — M545 Low back pain, unspecified: Secondary | ICD-10-CM | POA: Diagnosis not present

## 2021-04-28 DIAGNOSIS — M6281 Muscle weakness (generalized): Secondary | ICD-10-CM | POA: Diagnosis not present

## 2021-04-28 DIAGNOSIS — S82851D Displaced trimalleolar fracture of right lower leg, subsequent encounter for closed fracture with routine healing: Secondary | ICD-10-CM | POA: Diagnosis not present

## 2021-04-28 DIAGNOSIS — Z9181 History of falling: Secondary | ICD-10-CM | POA: Diagnosis not present

## 2021-04-29 DIAGNOSIS — M6281 Muscle weakness (generalized): Secondary | ICD-10-CM | POA: Diagnosis not present

## 2021-04-29 DIAGNOSIS — Z9181 History of falling: Secondary | ICD-10-CM | POA: Diagnosis not present

## 2021-04-29 DIAGNOSIS — S82851D Displaced trimalleolar fracture of right lower leg, subsequent encounter for closed fracture with routine healing: Secondary | ICD-10-CM | POA: Diagnosis not present

## 2021-04-29 DIAGNOSIS — M545 Low back pain, unspecified: Secondary | ICD-10-CM | POA: Diagnosis not present

## 2021-05-01 DIAGNOSIS — S82851D Displaced trimalleolar fracture of right lower leg, subsequent encounter for closed fracture with routine healing: Secondary | ICD-10-CM | POA: Diagnosis not present

## 2021-05-01 DIAGNOSIS — M545 Low back pain, unspecified: Secondary | ICD-10-CM | POA: Diagnosis not present

## 2021-05-01 DIAGNOSIS — M6281 Muscle weakness (generalized): Secondary | ICD-10-CM | POA: Diagnosis not present

## 2021-05-01 DIAGNOSIS — Z9181 History of falling: Secondary | ICD-10-CM | POA: Diagnosis not present

## 2021-05-04 ENCOUNTER — Non-Acute Institutional Stay (SKILLED_NURSING_FACILITY): Payer: Medicare Other | Admitting: Orthopedic Surgery

## 2021-05-04 ENCOUNTER — Encounter: Payer: Self-pay | Admitting: Orthopedic Surgery

## 2021-05-04 DIAGNOSIS — G8929 Other chronic pain: Secondary | ICD-10-CM

## 2021-05-04 DIAGNOSIS — R5383 Other fatigue: Secondary | ICD-10-CM

## 2021-05-04 DIAGNOSIS — M545 Low back pain, unspecified: Secondary | ICD-10-CM | POA: Diagnosis not present

## 2021-05-04 DIAGNOSIS — D61818 Other pancytopenia: Secondary | ICD-10-CM

## 2021-05-04 DIAGNOSIS — E039 Hypothyroidism, unspecified: Secondary | ICD-10-CM

## 2021-05-04 DIAGNOSIS — R413 Other amnesia: Secondary | ICD-10-CM | POA: Diagnosis not present

## 2021-05-04 DIAGNOSIS — N1832 Chronic kidney disease, stage 3b: Secondary | ICD-10-CM

## 2021-05-04 DIAGNOSIS — I10 Essential (primary) hypertension: Secondary | ICD-10-CM

## 2021-05-04 DIAGNOSIS — F418 Other specified anxiety disorders: Secondary | ICD-10-CM

## 2021-05-04 DIAGNOSIS — K5909 Other constipation: Secondary | ICD-10-CM

## 2021-05-04 DIAGNOSIS — Z9181 History of falling: Secondary | ICD-10-CM | POA: Diagnosis not present

## 2021-05-04 DIAGNOSIS — S82851D Displaced trimalleolar fracture of right lower leg, subsequent encounter for closed fracture with routine healing: Secondary | ICD-10-CM

## 2021-05-04 DIAGNOSIS — M6281 Muscle weakness (generalized): Secondary | ICD-10-CM | POA: Diagnosis not present

## 2021-05-04 NOTE — Progress Notes (Signed)
Location:   [facility] Room Number: 35 A Place of Service:  SNF (31) Provider:  Windell Moulding, NP  Virgie Dad, MD  Patient Care Team: Virgie Dad, MD as PCP - General (Internal Medicine) Mast, Man X, NP as Nurse Practitioner (Internal Medicine)  Extended Emergency Contact Information Primary Emergency Contact: Colee,[name] Address: [address]          [place], [place] [address] [place] Phone: [phone] Mobile Phone: [phone] Relation: Son Secondary Emergency Contact: Towell, Kathi Der [place] Phone: [phone] Mobile Phone: [phone] Relation: Other  Code Status:  DNR Goals of care: Advanced Directive information Advanced Directives 05/04/2021  Does Patient Have a Medical Advance Directive? Yes  Type of Paramedic of [place];Out of facility DNR (pink MOST or yellow form)  Does patient want to make changes to medical advance directive? No - Patient declined  Copy of [place] in Chart? Yes - validated most recent copy scanned in chart (See row information)  Pre-existing out of facility DNR order (yellow form or pink MOST form) Pink MOST form placed in chart (order not valid for inpatient use);Yellow form placed in chart (order not valid for inpatient use)     Chief Complaint  Patient presents with   Medical Management of Chronic Issues    Routine follow up visit.   Health Maintenance    Flu vaccine, 3rd COVID booster    HPI:  Pt is a 87 y.o. female seen today for medical management of chronic diseases.    She currently resides on the skilled nursing unit at [facility]. Past medical history includes: HTN, PVD, constipation, GERD, hypothyroidism, OA, osteoporosis, CKD3, cystitis, pancytopenia, depression/anxiety, weakness.   Closed Trimalleolar fracture- she fell in her room in [place], hospitalized 09/01-09/07, she was admitted to snf after incident, she  had another fall 09/10- suspect due to lethargy and high dose tramadol, right lower leg casted, remains NWB, pain controlled with gabapentin, oxycodone and tylenol Lethargy- suspect due to high dose tramadol, 87% in ED 09/10, she remains on 1 liter oxygen today, CT head 09/10 noted parenchymal atrophy and microvascular disease Pancytopenia- seen by Dr. Alen Blew in past, f/u biopsy not recommended due to age HTN- BUN/creat 27/0.98 04/04/2021, remains on torsemide and losartan CKD 3a- see above Hypothyroidism- TSH 4.69 05/2021, levothyroxine 75 mcg Constipation- LBM 10/10, remains on senna and miralax prn Back pain- ongoing since fall 09/10, CT spine noted acute to subacute right transverse fractures to L1 and L2, remains on gabapentin/ tylenol and oxycodone prn for pain Depression/anxiety- no recent panic attacks, remains on zoloft  No recent falls, injuries or behavioral outbursts.   Recent blood pressures:  10/04- 140/63  09/27- 134/53  09/20- 128/55  Recent weights:  10/04- 171.1 lbs  09/14- 173 lbs  09/07- 182.1 lbs    Past Medical History:  Diagnosis Date   Anemia    Anxiety    Chronotropic incompetence 12/2012    potentially medication related; noted on CPET    DDD (degenerative disc disease)    Eczema    GERD (gastroesophageal reflux disease)    H/O hiatal hernia    Hx: UTI (urinary tract infection)    Hyperlipidemia    Hypertension    Hypothyroidism    Osteopenia    Septicemia ([place]) 2002   following UTI   Vertigo, benign positional    Past Surgical History:  Procedure Laterality Date  BREAST SURGERY     left biopsy   CATARACT EXTRACTION Right    2 weeks ago   CPET / MET - PFTS     Consistent with chronotropic incompetence; See attached report in the results section   DILATION AND CURETTAGE OF UTERUS     x 2   DOPPLER ECHOCARDIOGRAPHY  01/10/2013   Normal LV size and function. Normal EF. Air sclerosis but no stenosis   ESOPHAGOGASTRODUODENOSCOPY  05/04/2012    Procedure: ESOPHAGOGASTRODUODENOSCOPY (EGD);  Surgeon: Inda Castle, MD;  Location: [facility];  Service: Endoscopy;  Laterality: N/A;   EYE SURGERY     cataract extraction with ILO  ? eye   IR KYPHO EA ADDL LEVEL THORACIC OR LUMBAR  10/28/2016   IR KYPHO LUMBAR INC FX REDUCE BONE BX UNI/BIL CANNULATION INC/IMAGING  10/28/2016   IR KYPHO THORACIC WITH BONE BIOPSY  12/24/2016   IR RADIOLOGIST EVAL & MGMT  11/11/2016   KNEE ARTHROSCOPY  2011   right   ORIF ANKLE FRACTURE Right 03/27/2021   Procedure: OPEN REDUCTION INTERNAL FIXATION (ORIF) ANKLE FRACTURE;  Surgeon: Marchia Bond, MD;  Location: [facility];  Service: Orthopedics;  Laterality: Right;   TONSILLECTOMY     TOTAL KNEE ARTHROPLASTY  04/24/2012   Procedure: TOTAL KNEE ARTHROPLASTY;  Surgeon: Gearlean Alf, MD;  Location: [facility];  Service: Orthopedics;  Laterality: Right;   TRANSTHORACIC ECHOCARDIOGRAM  02/2018   Ordered by PCP for "congestive heart failure " -EF 60 M 65%.  GR 1 DD.  No R WMA--- > ESSENTIALLY NORMAL    No Known Allergies  Allergies as of 05/04/2021   No Known Allergies      Medication List        Accurate as of May 04, 2021 11:48 AM. If you have any questions, ask your nurse or doctor.          STOP taking these medications    aspirin EC 81 MG tablet Stopped by: Yvonna Alanis, NP       TAKE these medications    acetaminophen 500 MG tablet Commonly known as: TYLENOL Take 1,000 mg by mouth 3 (three) times daily as needed for mild pain.   ALPRAZolam 0.25 MG tablet Commonly known as: XANAX Take 1 tablet (0.25 mg total) by mouth at bedtime as needed for sleep or anxiety.   atorvastatin 10 MG tablet Commonly known as: LIPITOR Take 5 mg by mouth at bedtime.   buPROPion 150 MG 12 hr tablet Commonly known as: WELLBUTRIN SR Take 1 tablet (150 mg total) by mouth 2 (two) times daily after a meal.   calcium citrate-vitamin D 315-200 MG-UNIT tablet Commonly known as: CITRACAL+D Take 1 tablet by  mouth 2 (two) times daily.   Camphor-Menthol-Methyl Sal 3.07-31-08 % Ptch Place 1 patch onto the skin See admin instructions. Apply one patch onto the skin of the  lower back daily for back pain   ferrous sulfate 325 (65 FE) MG EC tablet Take 325 mg by mouth every Monday, Wednesday, and Friday. With a meal   fish oil-omega-3 fatty acids 1000 MG capsule Take 1 g by mouth every morning.   gabapentin 100 MG capsule Commonly known as: NEURONTIN Take 100 mg by mouth 3 (three) times daily.   hydrocortisone cream 1 % Apply 1 application topically daily as needed for itching.   levothyroxine 75 MCG tablet Commonly known as: SYNTHROID Take 75 mcg by mouth every morning.   losartan 25 MG tablet Commonly known as:  COZAAR Take 25 mg by mouth every morning.   nystatin powder Commonly known as: MYCOSTATIN/NYSTOP Apply 1 application topically 2 (two) times daily as needed (yeast).   omeprazole 40 MG capsule Commonly known as: PRILOSEC Take 40 mg by mouth every morning.   polyethylene glycol 17 g packet Commonly known as: MIRALAX / GLYCOLAX Take 17 g by mouth daily as needed (constipation).   polyvinyl alcohol 1.4 % ophthalmic solution Commonly known as: LIQUIFILM TEARS Place 1 drop into both eyes 3 (three) times daily.   potassium chloride SA 20 MEQ tablet Commonly known as: KLOR-CON Take 20 mEq by mouth 2 (two) times daily.   PRO-STAT PO Take 30 mLs by mouth daily.   senna 8.6 MG Tabs tablet Commonly known as: SENOKOT Take 2 tablets by mouth at bedtime.   sertraline 25 MG tablet Commonly known as: ZOLOFT Take 75 mg by mouth every morning.   torsemide 20 MG tablet Commonly known as: DEMADEX Take 20 mg by mouth every morning.   zinc oxide 20 % ointment Apply 1 application topically See admin instructions. Apply topically to per/buttocks after every incontinent episode and as needed for redness        Review of Systems  Constitutional:  Negative for activity change,  appetite change, chills and fatigue.  HENT:  Negative for congestion, dental problem, hearing loss and trouble swallowing.   Eyes:  Negative for visual disturbance.  Respiratory:  Negative for cough, shortness of breath and wheezing.        Oxygen use  Cardiovascular:  Negative for chest pain and leg swelling.  Gastrointestinal:  Positive for constipation. Negative for abdominal distention, abdominal pain, blood in stool, diarrhea, nausea and vomiting.  Genitourinary:  Negative for dysuria, frequency and hematuria.  Musculoskeletal:  Positive for arthralgias, back pain and gait problem.  Skin: Negative.   Neurological:  Positive for weakness. Negative for dizziness, light-headedness and headaches.  Psychiatric/Behavioral:  Positive for decreased concentration. Negative for sleep disturbance. The patient is not nervous/anxious.    Immunization History  Administered Date(s) Administered   Influenza, High Dose Seasonal PF 04/26/2016, 03/22/2017, 05/08/2019   Influenza,inj,Quad PF,6+ Mos 04/27/2018   Influenza-Unspecified 04/22/2011, 04/04/2012, 05/03/2013, 05/04/2013, 04/17/2014, 04/19/2015, 04/26/2016, 04/22/2017   Moderna SARS-COV2 Booster Vaccination 06/09/2020, 12/31/2020   Moderna Sars-Covid-2 Vaccination 07/30/2019, 08/27/2019   PFIZER(Purple Top)SARS-COV-2 Vaccination 04/15/2021   Pneumococcal Polysaccharide-23 08/26/2003, 09/06/2011, 05/15/2019   Pneumococcal-Unspecified 12/20/2013   Td 09/26/2006   Tdap 05/15/2019   Zoster Recombinat (Shingrix) 11/21/2017, 04/08/2018   Zoster, Live 10/25/2007   Pertinent  Health Maintenance Due  Topic Date Due   INFLUENZA VACCINE  02/23/2021   DEXA SCAN  Completed   Fall Risk  10/18/2018 08/23/2018 05/09/2018 05/01/2018 04/10/2018  Falls in the past year? 0 0 Yes Yes No  Comment - - - fell in June/2019 _0  Home (asst living) -  Number falls in past yr: 0 0 1 1 -  Injury with Fall? 0 0 No No -  Risk for fall due to : - - - Impaired  balance/gait -  Follow up - - - Falls prevention discussed -   Functional Status Survey:    Vitals:   05/04/21 1127  BP: 140/63  Pulse: 74  Resp: 20  Temp: (!) 97.5 F (36.4 C)  SpO2: 95%  Weight: 171 lb 1.6 oz (77.6 kg)  Height: _1  (1.626 m)   Body mass index is 29.37 kg/m. Physical Exam Vitals reviewed.  Constitutional:      General: She  is not in acute distress. HENT:     Head: Normocephalic.     Right Ear: There is no impacted cerumen.     Left Ear: There is no impacted cerumen.     Nose: Nose normal.     Mouth/Throat:     Mouth: Mucous membranes are moist.  Eyes:     General:        Right eye: No discharge.        Left eye: No discharge.  Neck:     Vascular: No carotid bruit.  Cardiovascular:     Rate and Rhythm: Normal rate and regular rhythm.     Pulses: Normal pulses.     Heart sounds: Normal heart sounds. No murmur heard. Pulmonary:     Effort: Pulmonary effort is normal. No respiratory distress.     Breath sounds: Normal breath sounds. No wheezing.     Comments: 1 liter oxygen Abdominal:     General: Bowel sounds are normal. There is no distension.     Palpations: Abdomen is soft.     Tenderness: There is no abdominal tenderness.  Musculoskeletal:     Cervical back: Normal range of motion.     Left lower leg: Edema present.     Comments: RLE with hard cast, toes with full sensation/ROM, skin blanchable, cast cool to touch. Non-pitting edema to LLE  Lymphadenopathy:     Cervical: No cervical adenopathy.  Skin:    General: Skin is warm and dry.     Capillary Refill: Capillary refill takes less than 2 seconds.  Neurological:     General: No focal deficit present.     Mental Status: She is alert and oriented to person, place, and time. Mental status is at baseline.     Motor: Weakness present.     Gait: Gait abnormal.     Comments: Bed bound  Psychiatric:        Mood and Affect: Mood normal.        Behavior: Behavior normal.    Labs  reviewed: Recent Labs    03/28/21 0547 03/30/21 0503 04/04/21 1827  NA 137 137 145  K 4.0 3.5 4.5  CL 104 100 103  CO2 _0 GLUCOSE 117* 101* 104*  BUN 22 36* 27*  CREATININE 1.00 1.31* 0.98  CALCIUM 9.2 8.9 9.7   Recent Labs    06/05/20 0000 03/27/21 0543 04/04/21 1827  AST 20 21 37  ALT _1 ALKPHOS 94 69 95  BILITOT  --  0.4 0.5  PROT  --  7.4 7.9  ALBUMIN 3.9 3.7 3.2*   Recent Labs    06/05/20 0000 10/16/20 1804 03/26/21 0334 03/27/21 0543 03/30/21 0503 03/31/21 0444 04/04/21 1827  WBC 4.4   < > 11.0*   < > 10.7* 9.3 10.3  NEUTROABS 2,213.00  --  8.6*  --   --   --  8.0*  HGB 9.7*   < > 10.0*   < > 7.6* 8.1* 8.2*  HCT 31*   < > 33.7*   < > 24.6* 26.1* 27.8*  MCV  --    < > 91.6   < > 83.4 82.9 85.3  PLT 77*   < > 46*   < > 83* 97* 226   < > = values in this interval not displayed.   Lab Results  Component Value Date   TSH 4.69 06/05/2020   No results found for: HGBA1C Lab Results  Component  Value Date   CHOL 154 04/10/2020   HDL 47 04/10/2020   LDLCALC 87 04/10/2020   TRIG 108 04/10/2020   CHOLHDL 3.8 11/23/2018    Significant Diagnostic Results in last 30 days:  CT HEAD WO CONTRAST (5MM)  Result Date: 04/04/2021 CLINICAL DATA:  Head trauma.  Altered mental status. EXAM: CT HEAD WITHOUT CONTRAST TECHNIQUE: Contiguous axial images were obtained from the base of the skull through the vertex without intravenous contrast. COMPARISON:  March 26, 2021 FINDINGS: Brain: No evidence of acute infarction, hemorrhage, hydrocephalus, extra-axial collection or mass lesion/mass effect. Marked brain parenchymal volume loss and deep white matter microangiopathy. Vascular: No hyperdense vessel or unexpected calcification. Skull: Normal. Negative for fracture or focal lesion. Sinuses/Orbits: No acute finding. Other: None. IMPRESSION: 1. No acute intracranial abnormality. 2. Marked brain parenchymal atrophy and chronic microvascular disease. Electronically  Signed   By: Fidela Salisbury M.D.   On: 04/04/2021 18:51   CT Cervical Spine Wo Contrast  Result Date: 04/04/2021 CLINICAL DATA:  Head trauma, fall. Patient in a cervical spine collar. EXAM: CT CERVICAL SPINE WITHOUT CONTRAST TECHNIQUE: Multidetector CT imaging of the cervical spine was performed without intravenous contrast. Multiplanar CT image reconstructions were also generated. COMPARISON:  March 26, 2021. FINDINGS: Alignment: Accentuation of normal cervical lordotic curvature is unchanged compared to recent imaging in the setting of multilevel degenerative change. Skull base and vertebrae: No acute fracture. No primary bone lesion or focal pathologic process. Soft tissues and spinal canal: No prevertebral fluid or swelling. No visible canal hematoma. Disc levels: Multilevel spinal degenerative changes are unchanged compared to prior imaging greatest in the mid cervical spine with respect to disc space narrowing and greatest in the upper to mid cervical spine with respect to facet arthropathy Upper chest: Biapical scarring similar to prior imaging. Other: None IMPRESSION: No acute fracture or static subluxation of the cervical spine. Multilevel spinal degenerative changes are unchanged compared to prior imaging. Electronically Signed   By: Zetta Bills M.D.   On: 04/04/2021 18:56   CT Lumbar Spine Wo Contrast  Result Date: 04/04/2021 CLINICAL DATA:  Fall back pain EXAM: CT LUMBAR SPINE WITHOUT CONTRAST TECHNIQUE: Multidetector CT imaging of the lumbar spine was performed without intravenous contrast administration. Multiplanar CT image reconstructions were also generated. COMPARISON:  Radiograph 10/16/2020, CT 10/24/2016, CT 03/26/2021 FINDINGS: Segmentation: 5 lumbar type vertebrae. Alignment: S shaped scoliosis. Sagittal alignment within normal limits. Vertebrae: Treated compression deformities at T11, L1 and L2. Remaining vertebra demonstrate normal stature. Subacute appearing fracture right  transverse process at L1, acute to subacute right transverse process fracture at L2. Paraspinal and other soft tissues: No paravertebral or paraspinal soft tissue abnormality. Aortic atherosclerosis. Fatty atrophy of paraspinous muscles. Disc levels: At T12-L1, maintained disc space.  No canal stenosis. At L1-L2, maintained disc space. No canal stenosis. The foramen patent. At L2-L3, maintained disc space. Diffuse disc bulge, asymmetric to the left. No canal stenosis. No high-grade foraminal narrowing. At L3-L4, maintained disc space. No canal stenosis. Facet degenerative changes. No high-grade foraminal narrowing. At L4-L5, moderate to marked disc space narrowing. Diffuse disc bulge. Ligamentum flavum thickening and facet degeneration with mild canal stenosis. Right greater than left foraminal narrowing. At L5-S1, maintained disc space. No canal stenosis. Foramen are patent bilaterally. IMPRESSION: 1. Chronic treated compression fractures at T11, L1 and L2. 2. Acute to subacute right transverse process fractures at L1 and L2 3. Scoliosis and degenerative changes of the lumbar spine Electronically Signed   By: Donavan Foil  M.D.   On: 04/04/2021 20:45   DG Chest Portable 1 View  Result Date: 04/04/2021 CLINICAL DATA:  Fall. EXAM: PORTABLE CHEST 1 VIEW COMPARISON:  Chest radiograph dated 03/26/2021. FINDINGS: Patchy area of diffuse density primarily involving the right upper lobe as well as areas of increased interstitial and hazy density in the left lung may represent parenchymal crowding related to shallow inspiration. Developing infiltrate is not excluded. Clinical correlation is recommended. No pleural effusion pneumothorax. The cardiac silhouette is within limits. Atherosclerotic calcification of the aorta. Osteopenia with degenerative changes of the spine. No acute osseous pathology. Multilevel vertebroplasties. IMPRESSION: Areas of possible atelectasis. Developing infiltrate is not excluded. Clinical  correlation is recommended. Electronically Signed   By: Anner Crete M.D.   On: 04/04/2021 19:21   DG Foot 2 Views Left  Result Date: 04/04/2021 CLINICAL DATA:  Fall.  Left foot pain. EXAM: LEFT FOOT - 2 VIEW COMPARISON:  None. FINDINGS: There is no acute fracture or dislocation. The bones are osteopenic. Old healed fracture deformity of the fifth metatarsal. The soft tissues are grossly unremarkable. No radiopaque foreign object or soft tissue gas. IMPRESSION: 1. No acute fracture or dislocation. 2. Osteopenia. Electronically Signed   By: Anner Crete M.D.   On: 04/04/2021 21:38   DG Hip Unilat W or Wo Pelvis 2-3 Views Left  Result Date: 04/04/2021 CLINICAL DATA:  Status post fall.  Concern for fracture. EXAM: DG HIP (WITH OR WITHOUT PELVIS) 2-3V LEFT COMPARISON:  None. FINDINGS: There is no evidence of hip fracture or dislocation. Osteoarthritic changes of bilateral hips. IMPRESSION: No acute fracture or dislocation identified about the left hip. Electronically Signed   By: Fidela Salisbury M.D.   On: 04/04/2021 19:14    Assessment/Plan 1. Closed trimalleolar fracture of right ankle with routine healing, subsequent encounter - NWB RLE, hard cast intact - pain controlled with tylenol, gabapentin and oxycodone prn - LBM 10/10 - cont f/u with ortho  2. Lethargy - suspect due to high tramadol use - using 1 liter oxygen since last ED visit - wean oxygen  3. Pancytopenia ([place]) - followed by Dr. Alen Blew in past - f/u biopsy declined due to advanced age  54. Essential hypertension - controlled - cont losartan and torsemide  5. CKD stage 3a - BUN/creat 27/0.98 04/04/2021  6. Acquired hypothyroidism - TSH 4.69 05/2020 - cont levothyroxine - TSH- next month  7. Chronic constipation - LBM 10/10 - cont senna and miralax prn  8. Chronic midline low back pain without sciatica - ongoing since fall 09/10,  -CT spine noted acute to subacute right transverse fractures to L1 and  L2 - cont gabapentin/ tylenol and oxycodone prn for pain  9. Depression with anxiety - cont zoloft   10. Memory impairment - CT head 09/10 noted parenchymal atrophy and microvascular disease - appropriate today - cont skilled nursing care    Family/ staff Communication: plan discussed with patient and nurse  Labs/tests ordered:  none

## 2021-05-06 DIAGNOSIS — S82851D Displaced trimalleolar fracture of right lower leg, subsequent encounter for closed fracture with routine healing: Secondary | ICD-10-CM | POA: Diagnosis not present

## 2021-05-06 DIAGNOSIS — M6281 Muscle weakness (generalized): Secondary | ICD-10-CM | POA: Diagnosis not present

## 2021-05-06 DIAGNOSIS — M545 Low back pain, unspecified: Secondary | ICD-10-CM | POA: Diagnosis not present

## 2021-05-06 DIAGNOSIS — Z9181 History of falling: Secondary | ICD-10-CM | POA: Diagnosis not present

## 2021-05-08 DIAGNOSIS — M545 Low back pain, unspecified: Secondary | ICD-10-CM | POA: Diagnosis not present

## 2021-05-08 DIAGNOSIS — Z9181 History of falling: Secondary | ICD-10-CM | POA: Diagnosis not present

## 2021-05-08 DIAGNOSIS — S82851D Displaced trimalleolar fracture of right lower leg, subsequent encounter for closed fracture with routine healing: Secondary | ICD-10-CM | POA: Diagnosis not present

## 2021-05-08 DIAGNOSIS — M6281 Muscle weakness (generalized): Secondary | ICD-10-CM | POA: Diagnosis not present

## 2021-05-11 ENCOUNTER — Encounter: Payer: Self-pay | Admitting: Orthopedic Surgery

## 2021-05-11 ENCOUNTER — Non-Acute Institutional Stay (SKILLED_NURSING_FACILITY): Payer: Medicare Other | Admitting: Orthopedic Surgery

## 2021-05-11 DIAGNOSIS — G8929 Other chronic pain: Secondary | ICD-10-CM

## 2021-05-11 DIAGNOSIS — Z9181 History of falling: Secondary | ICD-10-CM | POA: Diagnosis not present

## 2021-05-11 DIAGNOSIS — K5909 Other constipation: Secondary | ICD-10-CM

## 2021-05-11 DIAGNOSIS — L89626 Pressure-induced deep tissue damage of left heel: Secondary | ICD-10-CM

## 2021-05-11 DIAGNOSIS — I10 Essential (primary) hypertension: Secondary | ICD-10-CM

## 2021-05-11 DIAGNOSIS — D61818 Other pancytopenia: Secondary | ICD-10-CM

## 2021-05-11 DIAGNOSIS — E039 Hypothyroidism, unspecified: Secondary | ICD-10-CM | POA: Diagnosis not present

## 2021-05-11 DIAGNOSIS — M6281 Muscle weakness (generalized): Secondary | ICD-10-CM | POA: Diagnosis not present

## 2021-05-11 DIAGNOSIS — S82851D Displaced trimalleolar fracture of right lower leg, subsequent encounter for closed fracture with routine healing: Secondary | ICD-10-CM | POA: Diagnosis not present

## 2021-05-11 DIAGNOSIS — M545 Low back pain, unspecified: Secondary | ICD-10-CM | POA: Diagnosis not present

## 2021-05-11 DIAGNOSIS — N1832 Chronic kidney disease, stage 3b: Secondary | ICD-10-CM | POA: Diagnosis not present

## 2021-05-11 NOTE — Progress Notes (Signed)
Location:  [facility] Room Number: [place] of Service:  SNF [phone]) Provider:  Windell Moulding, AGNP-C  Virgie Dad, MD  Patient Care Team: Virgie Dad, MD as PCP - General (Internal Medicine) Mast, Man X, NP as Nurse Practitioner (Internal Medicine)  Extended Emergency Contact Information Primary Emergency Contact: Circle,[name] Address: [address]          [place], [place] [address] [place] Phone: [phone] Mobile Phone: [phone] Relation: Son Secondary Emergency Contact: Bollard, Kathi [place] Phone: [phone] Mobile Phone: [phone] Relation: Other  Code Status:  DNR Goals of care: Advanced Directive information Advanced Directives 05/04/2021  Does Patient Have a Medical Advance Directive? Yes  Type of Paramedic of [place];Out of facility DNR (pink MOST or yellow form)  Does patient want to make changes to medical advance directive? No - Patient declined  Copy of [place] in Chart? Yes - validated most recent copy scanned in chart (See row information)  Pre-existing out of facility DNR order (yellow form or pink MOST form) Pink MOST form placed in chart (order not valid for inpatient use);Yellow form placed in chart (order not valid for inpatient use)     Chief Complaint  Patient presents with   Acute Visit    Left heel injury    HPI:  Pt is a 87 y.o. female seen today for acute visit due to ongoing left heel injury.   She currently resides on the skilled nursing unit at [facility]. Past medical history includes: HTN, PVD, constipation, GERD, hypothyroidism, OA, osteoporosis, CKD3, cystitis, pancytopenia, depression/anxiety, weakness   Wound nurse reports deep tissue injury to left heel. At this time, the wound is not open, dark hard scab noted. Patient denies any pain or discomfort. She has been bed bound since right trimalleolar fracture. RLE  casted, remains NWB x 3 weeks per Dr. Mardelle Matte. Prevalon boot has been placed to left heel for prevention. Her left heel is elevated in bed today when seen. Nursing reports she has been refusing Prostat protein supplement. Discussed the importance of protein with patient. She would like to try Boost protein supplement shake.Treatment options discussed with son in care meeting. He would like to consult wound care. Appointment scheduled for 07/01/2021.   Pancytopenia- seen by Dr. Alen Blew in past, f/u biopsy not recommended due to age, hgb 8.2 04/02/2021, remains on ferous sulfate HTN- BUN/creat 27/0.98, remains on torsemide and losartan CKD 3a- see above Hypothyroidism- TSH 4.69 05/2020, levothyroxine 75 mcg daily Constipation- having daily bowel movements, senna daily and miralax prn Back pain- 04/04/2021 CT spine noted acute to subacute right transverse fractures to L1 and L2, intermittent pain, reports feeling stiff at times, remains on gabapentin/tylenol and oxycodone prn for pain Depression/anxiety- remains on zoloft   Past Medical History:  Diagnosis Date   Anemia    Anxiety    Chronotropic incompetence 12/2012    potentially medication related; noted on CPET    DDD (degenerative disc disease)    Eczema    GERD (gastroesophageal reflux disease)    H/O hiatal hernia    Hx: UTI (urinary tract infection)    Hyperlipidemia    Hypertension    Hypothyroidism    Osteopenia    Septicemia ([place]) 2002   following UTI   Vertigo, benign positional    Past Surgical History:  Procedure Laterality Date   BREAST SURGERY     left biopsy  CATARACT EXTRACTION Right    2 weeks ago   CPET / MET - PFTS     Consistent with chronotropic incompetence; See attached report in the results section   DILATION AND CURETTAGE OF UTERUS     x 2   DOPPLER ECHOCARDIOGRAPHY  01/10/2013   Normal LV size and function. Normal EF. Air sclerosis but no stenosis   ESOPHAGOGASTRODUODENOSCOPY  05/04/2012   Procedure:  ESOPHAGOGASTRODUODENOSCOPY (EGD);  Surgeon: Inda Castle, MD;  Location: [facility];  Service: Endoscopy;  Laterality: N/A;   EYE SURGERY     cataract extraction with ILO  ? eye   IR KYPHO EA ADDL LEVEL THORACIC OR LUMBAR  10/28/2016   IR KYPHO LUMBAR INC FX REDUCE BONE BX UNI/BIL CANNULATION INC/IMAGING  10/28/2016   IR KYPHO THORACIC WITH BONE BIOPSY  12/24/2016   IR RADIOLOGIST EVAL & MGMT  11/11/2016   KNEE ARTHROSCOPY  2011   right   ORIF ANKLE FRACTURE Right 03/27/2021   Procedure: OPEN REDUCTION INTERNAL FIXATION (ORIF) ANKLE FRACTURE;  Surgeon: Marchia Bond, MD;  Location: [facility];  Service: Orthopedics;  Laterality: Right;   TONSILLECTOMY     TOTAL KNEE ARTHROPLASTY  04/24/2012   Procedure: TOTAL KNEE ARTHROPLASTY;  Surgeon: Gearlean Alf, MD;  Location: [facility];  Service: Orthopedics;  Laterality: Right;   TRANSTHORACIC ECHOCARDIOGRAM  02/2018   Ordered by PCP for "congestive heart failure " -EF 60 M 65%.  GR 1 DD.  No R WMA--- > ESSENTIALLY NORMAL    No Known Allergies  Outpatient Encounter Medications as of 05/11/2021  Medication Sig   acetaminophen (TYLENOL) 500 MG tablet Take 1,000 mg by mouth 3 (three) times daily as needed for mild pain.   ALPRAZolam (XANAX) 0.25 MG tablet Take 1 tablet (0.25 mg total) by mouth at bedtime as needed for sleep or anxiety.   Amino Acids-Protein Hydrolys (PRO-STAT PO) Take 30 mLs by mouth daily.   atorvastatin (LIPITOR) 10 MG tablet Take 5 mg by mouth at bedtime.   buPROPion (WELLBUTRIN SR) 150 MG 12 hr tablet Take 1 tablet (150 mg total) by mouth 2 (two) times daily after a meal.   calcium citrate-vitamin D (CITRACAL+D) 315-200 MG-UNIT tablet Take 1 tablet by mouth 2 (two) times daily.   Camphor-Menthol-Methyl Sal 3.07-31-08 % PTCH Place 1 patch onto the skin See admin instructions. Apply one patch onto the skin of the  lower back daily for back pain   ferrous sulfate 325 (65 FE) MG EC tablet Take 325 mg by mouth every Monday, Wednesday, and  Friday. With a meal   fish oil-omega-3 fatty acids 1000 MG capsule Take 1 g by mouth every morning.   gabapentin (NEURONTIN) 100 MG capsule Take 100 mg by mouth 3 (three) times daily.   hydrocortisone cream 1 % Apply 1 application topically daily as needed for itching.   levothyroxine (SYNTHROID) 75 MCG tablet Take 75 mcg by mouth every morning.   losartan (COZAAR) 25 MG tablet Take 25 mg by mouth every morning.   nystatin (MYCOSTATIN/NYSTOP) powder Apply 1 application topically 2 (two) times daily as needed (yeast).   omeprazole (PRILOSEC) 40 MG capsule Take 40 mg by mouth every morning.   OxyCODONE HCl, Abuse Deter, (OXAYDO) 5 MG TABA Take 2.5 mg by mouth 2 (two) times daily as needed. Give 30 minutes before transportation to ortho appointments.   polyethylene glycol (MIRALAX / GLYCOLAX) 17 g packet Take 17 g by mouth daily as needed (constipation).   polyvinyl alcohol (  LIQUIFILM TEARS) 1.4 % ophthalmic solution Place 1 drop into both eyes 3 (three) times daily.   potassium chloride SA (KLOR-CON) 20 MEQ tablet Take 20 mEq by mouth 2 (two) times daily.   senna (SENOKOT) 8.6 MG TABS tablet Take 2 tablets by mouth at bedtime.   sertraline (ZOLOFT) 25 MG tablet Take 75 mg by mouth every morning.   torsemide (DEMADEX) 20 MG tablet Take 20 mg by mouth every morning.   zinc oxide 20 % ointment Apply 1 application topically See admin instructions. Apply topically to per/buttocks after every incontinent episode and as needed for redness   No facility-administered encounter medications on file as of 05/11/2021.    Review of Systems  Constitutional:  Negative for activity change, appetite change, chills, fatigue and fever.  HENT:  Negative for dental problem, hearing loss and trouble swallowing.   Eyes:  Negative for visual disturbance.  Respiratory:  Negative for cough, shortness of breath and wheezing.   Cardiovascular:  Negative for chest pain and leg swelling.  Gastrointestinal:  Positive for  constipation. Negative for abdominal distention, abdominal pain, blood in stool, diarrhea, nausea and vomiting.  Genitourinary:  Negative for dysuria, frequency and hematuria.  Musculoskeletal:  Positive for arthralgias, gait problem and myalgias.  Skin:        Left heel injury  Neurological:  Positive for weakness. Negative for dizziness and headaches.  Psychiatric/Behavioral:  Positive for dysphoric mood. Negative for confusion and sleep disturbance. The patient is not nervous/anxious.    Immunization History  Administered Date(s) Administered   Influenza, High Dose Seasonal PF 04/26/2016, 03/22/2017, 05/08/2019   Influenza,inj,Quad PF,6+ Mos 04/27/2018   Influenza-Unspecified 04/22/2011, 04/04/2012, 05/03/2013, 05/04/2013, 04/17/2014, 04/19/2015, 04/26/2016, 04/22/2017   Moderna SARS-COV2 Booster Vaccination 06/09/2020, 12/31/2020   Moderna Sars-Covid-2 Vaccination 07/30/2019, 08/27/2019   PFIZER(Purple Top)SARS-COV-2 Vaccination 04/15/2021   Pneumococcal Polysaccharide-23 08/26/2003, 09/06/2011, 05/15/2019   Pneumococcal-Unspecified 12/20/2013   Td 09/26/2006   Tdap 05/15/2019   Zoster Recombinat (Shingrix) 11/21/2017, 04/08/2018   Zoster, Live 10/25/2007   Pertinent  Health Maintenance Due  Topic Date Due   INFLUENZA VACCINE  02/23/2021   DEXA SCAN  Completed   Fall Risk  10/18/2018 08/23/2018 05/09/2018 05/01/2018 04/10/2018  Falls in the past year? 0 0 Yes Yes No  Comment - - - fell in June/2019 [facility] -  Number falls in past yr: 0 0 1 1 -  Injury with Fall? 0 0 No No -  Risk for fall due to : - - - Impaired balance/gait -  Follow up - - - Falls prevention discussed -   Functional Status Survey:    Vitals:   05/11/21 1139  BP: (!) 129/53  Pulse: 61  Resp: 18  Temp: 97.9 F (36.6 C)  SpO2: 95%  Weight: 168 lb 4.8 oz (76.3 kg)  Height: '5\' 4"'  (1.626 m)   Body mass index is 28.89 kg/m. Physical Exam Vitals reviewed.  Constitutional:       General: She is not in acute distress.    Appearance: She is obese.  HENT:     Head: Normocephalic.  Eyes:     General:        Right eye: No discharge.        Left eye: No discharge.  Neck:     Vascular: No carotid bruit.  Cardiovascular:     Rate and Rhythm: Normal rate and regular rhythm.     Pulses: Normal pulses.     Heart sounds: Normal heart  sounds. No murmur heard. Pulmonary:     Effort: Pulmonary effort is normal. No respiratory distress.     Breath sounds: Normal breath sounds. No wheezing.  Abdominal:     General: Bowel sounds are normal. There is no distension.     Palpations: Abdomen is soft.     Tenderness: There is no abdominal tenderness.  Musculoskeletal:     Cervical back: Normal range of motion.     Right lower leg: No edema.     Left lower leg: No edema.     Comments: Cast to RLE, intact, no warmth, toes with full sensation, cap refill < 3sec.   Lymphadenopathy:     Cervical: No cervical adenopathy.  Skin:    General: Skin is warm and dry.     Capillary Refill: Capillary refill takes less than 2 seconds.     Comments: Left heel with DTI measuring 4 x 2 cm, skin black and hard, no skin breakdown/drainage or odor, some fine cracks in skin noted, surrounding skin absent of erythema, warmth, tenderness or swelling.   Neurological:     General: No focal deficit present.     Mental Status: She is alert and oriented to person, place, and time.     Motor: Weakness present.     Gait: Gait abnormal.     Comments: Bed bound  Psychiatric:        Mood and Affect: Mood normal. Affect is flat.        Behavior: Behavior normal.    Labs reviewed: Recent Labs    03/28/21 0547 03/30/21 0503 04/04/21 1827  NA 137 137 145  K 4.0 3.5 4.5  CL 104 100 103  CO2 '28 31 31  ' GLUCOSE 117* 101* 104*  BUN 22 36* 27*  CREATININE 1.00 1.31* 0.98  CALCIUM 9.2 8.9 9.7   Recent Labs    06/05/20 0000 03/27/21 0543 04/04/21 1827  AST 20 21 37  ALT '14 16 24  ' ALKPHOS 94 69  95  BILITOT  --  0.4 0.5  PROT  --  7.4 7.9  ALBUMIN 3.9 3.7 3.2*   Recent Labs    06/05/20 0000 10/16/20 1804 03/26/21 0334 03/27/21 0543 03/30/21 0503 03/31/21 0444 04/04/21 1827  WBC 4.4   < > 11.0*   < > 10.7* 9.3 10.3  NEUTROABS 2,213.00  --  8.6*  --   --   --  8.0*  HGB 9.7*   < > 10.0*   < > 7.6* 8.1* 8.2*  HCT 31*   < > 33.7*   < > 24.6* 26.1* 27.8*  MCV  --    < > 91.6   < > 83.4 82.9 85.3  PLT 77*   < > 46*   < > 83* 97* 226   < > = values in this interval not displayed.   Lab Results  Component Value Date   TSH 4.69 06/05/2020   No results found for: HGBA1C Lab Results  Component Value Date   CHOL 154 04/10/2020   HDL 47 04/10/2020   LDLCALC 87 04/10/2020   TRIG 108 04/10/2020   CHOLHDL 3.8 11/23/2018    Significant Diagnostic Results in last 30 days:  No results found.  Assessment/Plan 1. Pressure injury of deep tissue of left heel - measuring 4 x 2 cm, remains closed at this time with hard scab - continue to use prevalon boot while in bed - cont heel elevation throughout day - non compliant with Prostat protein  supplement - start Boost protein shakes daily - wound consult scheduled for 07/01/2021 - recommend applying Santyl to heel if wound becomes necrotic/ open   2. Closed trimalleolar fracture of right ankle with routine healing, subsequent encounter - RLE cast CDI, no warmth - remains NWB per Dr. Mardelle Matte - pain controlled  - cont PT/OT - cont tylenol and oxycodone prn for pain  3. Pancytopenia ([place]) - followed by Dr. Alen Blew in past- no further workup due to advanced age - hgb 8.2 04/02/2021  4. Essential hypertension - controlled  - cont torsemide  5. CKD stage 3a - continue to avoid nephrotoxic drugs like NSAIDS and dose adjust medications to be renally excreted  6. Acquired hypothyroidism - TSH normal - cont levothyroxine  7. Chronic constipation - stable with senna and miralax  8. Chronic midline low back pain without  sciatica - reports some stiffness - cont gabapentin, tylenol and oxycodone prn    Family/ staff Communication: plan discussed with patient and nurse  Labs/tests ordered:  none

## 2021-05-12 ENCOUNTER — Encounter: Payer: Self-pay | Admitting: Nurse Practitioner

## 2021-05-12 ENCOUNTER — Non-Acute Institutional Stay (SKILLED_NURSING_FACILITY): Payer: Medicare Other | Admitting: Nurse Practitioner

## 2021-05-12 DIAGNOSIS — I10 Essential (primary) hypertension: Secondary | ICD-10-CM | POA: Diagnosis not present

## 2021-05-12 DIAGNOSIS — G8929 Other chronic pain: Secondary | ICD-10-CM

## 2021-05-12 DIAGNOSIS — K5909 Other constipation: Secondary | ICD-10-CM

## 2021-05-12 DIAGNOSIS — F418 Other specified anxiety disorders: Secondary | ICD-10-CM

## 2021-05-12 DIAGNOSIS — E039 Hypothyroidism, unspecified: Secondary | ICD-10-CM | POA: Diagnosis not present

## 2021-05-12 DIAGNOSIS — K219 Gastro-esophageal reflux disease without esophagitis: Secondary | ICD-10-CM | POA: Diagnosis not present

## 2021-05-12 DIAGNOSIS — D638 Anemia in other chronic diseases classified elsewhere: Secondary | ICD-10-CM | POA: Diagnosis not present

## 2021-05-12 DIAGNOSIS — R600 Localized edema: Secondary | ICD-10-CM

## 2021-05-12 DIAGNOSIS — N1831 Chronic kidney disease, stage 3a: Secondary | ICD-10-CM | POA: Diagnosis not present

## 2021-05-12 DIAGNOSIS — M545 Low back pain, unspecified: Secondary | ICD-10-CM

## 2021-05-12 DIAGNOSIS — M6281 Muscle weakness (generalized): Secondary | ICD-10-CM | POA: Diagnosis not present

## 2021-05-12 DIAGNOSIS — Z9181 History of falling: Secondary | ICD-10-CM | POA: Diagnosis not present

## 2021-05-12 DIAGNOSIS — S82851D Displaced trimalleolar fracture of right lower leg, subsequent encounter for closed fracture with routine healing: Secondary | ICD-10-CM | POA: Diagnosis not present

## 2021-05-12 DIAGNOSIS — S82891A Other fracture of right lower leg, initial encounter for closed fracture: Secondary | ICD-10-CM

## 2021-05-12 NOTE — Assessment & Plan Note (Signed)
takes Levothyroxine, TSH 3.82 10/20/20, pending f/u TSH

## 2021-05-12 NOTE — Assessment & Plan Note (Signed)
Minimal LLE, mild swelling right toes, takes Torsemide, Bun/creat 27/0.98 04/04/21

## 2021-05-12 NOTE — Assessment & Plan Note (Signed)
HPOA reported the patient's constipation. The patient stated it was x1 a couple of days ago, last BM was 05/11/21, takes Senna II qhs, will schedule MiraLax qod.

## 2021-05-12 NOTE — Assessment & Plan Note (Signed)
Stable,  takes Omeprazole 

## 2021-05-12 NOTE — Progress Notes (Signed)
Location:   SNF [place] Room Number: 35 Place of Service:  SNF (31) Provider: [facility]  NP  Virgie Dad, MD  Patient Care Team: Virgie Dad, MD as PCP - General (Internal Medicine) ,  X, NP as Nurse Practitioner (Internal Medicine)  Extended Emergency Contact Information Primary Emergency Contact: Ranum,[name] Address: [address]          [place], [place] [address] [place] Phone: [phone] Mobile Phone: [phone] Relation: Son Secondary Emergency Contact: [name] [place] Phone: [phone] Mobile Phone: [phone] Relation: Other  Code Status:  DNR Goals of care: Advanced Directive information Advanced Directives 05/04/2021  Does Patient Have a Medical Advance Directive? Yes  Type of Paramedic of [place];Out of facility DNR (pink MOST or yellow form)  Does patient want to make changes to medical advance directive? No - Patient declined  Copy of [place] in Chart? Yes - validated most recent copy scanned in chart (See row information)  Pre-existing out of facility DNR order (yellow form or pink MOST form) Pink MOST form placed in chart (order not valid for inpatient use);Yellow form placed in chart (order not valid for inpatient use)     Chief Complaint  Patient presents with   Acute Visit    Constipation.     HPI:  Pt is a 87 y.o. female seen today for an acute visit for HPOA reported the patient's constipation. The patient stated it was x1 a couple of days ago, last BM was 05/11/21, takes Senna II qhs, prn MiraLax. The patient stated she will ask her family to provide apples for her.   Closed trimalleolar fracture of right ankle: hard cast, able to wiggle toes, Tylenol, Oxycodone for pain, off Tramadol CKD, stage 3 eGFR 56 03/2021             Hx of recurrent UTI             HTN, blood pressure is controlled on Losartan              Hemorrhoids, no  injury.                          Anemia, had work up but no biopsy 2/2 her age, takes Fe 3x/wk, stable. Hgb 8.2 04/04/21             Lower back pain, takes Gabapentin, Tylenol. 10/16/20 X-ray Vertebroplasty of T9, T11, L1, and L2 have been performed with stable compression deformities             Hypothyroidism, takes Levothyroxine, TSH 3.82 10/20/20, pending f/u TSH             GERD, takes Omeprazole              Edema BLE, minimal, takes Torsemide, Bun/creat 27/0.98 04/04/21             Depression, takes Sertraline, Bupropion, Alprazolam    Past Medical History:  Diagnosis Date   Anemia    Anxiety    Chronotropic incompetence 12/2012    potentially medication related; noted on CPET    DDD (degenerative disc disease)    Eczema    GERD (gastroesophageal reflux disease)    H/O hiatal hernia    Hx: UTI (urinary tract infection)    Hyperlipidemia    Hypertension    Hypothyroidism    Osteopenia  Septicemia ([place]) 2002   following UTI   Vertigo, benign positional    Past Surgical History:  Procedure Laterality Date   BREAST SURGERY     left biopsy   CATARACT EXTRACTION Right    2 weeks ago   CPET / MET - PFTS     Consistent with chronotropic incompetence; See attached report in the results section   DILATION AND CURETTAGE OF UTERUS     x 2   DOPPLER ECHOCARDIOGRAPHY  01/10/2013   Normal LV size and function. Normal EF. Air sclerosis but no stenosis   ESOPHAGOGASTRODUODENOSCOPY  05/04/2012   Procedure: ESOPHAGOGASTRODUODENOSCOPY (EGD);  Surgeon: Inda Castle, MD;  Location: [facility];  Service: Endoscopy;  Laterality: N/A;   EYE SURGERY     cataract extraction with ILO  ? eye   IR KYPHO EA ADDL LEVEL THORACIC OR LUMBAR  10/28/2016   IR KYPHO LUMBAR INC FX REDUCE BONE BX UNI/BIL CANNULATION INC/IMAGING  10/28/2016   IR KYPHO THORACIC WITH BONE BIOPSY  12/24/2016   IR RADIOLOGIST EVAL & MGMT  11/11/2016   KNEE ARTHROSCOPY  2011   right   ORIF ANKLE FRACTURE Right 03/27/2021    Procedure: OPEN REDUCTION INTERNAL FIXATION (ORIF) ANKLE FRACTURE;  Surgeon: Marchia Bond, MD;  Location: [facility];  Service: Orthopedics;  Laterality: Right;   TONSILLECTOMY     TOTAL KNEE ARTHROPLASTY  04/24/2012   Procedure: TOTAL KNEE ARTHROPLASTY;  Surgeon: Gearlean Alf, MD;  Location: [facility];  Service: Orthopedics;  Laterality: Right;   TRANSTHORACIC ECHOCARDIOGRAM  02/2018   Ordered by PCP for "congestive heart failure " -EF 60 M 65%.  GR 1 DD.  No R WMA--- > ESSENTIALLY NORMAL    No Known Allergies  Allergies as of 05/12/2021   No Known Allergies      Medication List        Accurate as of May 12, 2021 11:59 PM. If you have any questions, ask your nurse or doctor.          acetaminophen 500 MG tablet Commonly known as: TYLENOL Take 1,000 mg by mouth 3 (three) times daily as needed for mild pain.   ALPRAZolam 0.25 MG tablet Commonly known as: XANAX Take 1 tablet (0.25 mg total) by mouth at bedtime as needed for sleep or anxiety.   atorvastatin 10 MG tablet Commonly known as: LIPITOR Take 5 mg by mouth at bedtime.   buPROPion 150 MG 12 hr tablet Commonly known as: WELLBUTRIN SR Take 1 tablet (150 mg total) by mouth 2 (two) times daily after a meal.   calcium citrate-vitamin D 315-200 MG-UNIT tablet Commonly known as: CITRACAL+D Take 1 tablet by mouth 2 (two) times daily.   Camphor-Menthol-Methyl Sal 3.07-31-08 % Ptch Place 1 patch onto the skin See admin instructions. Apply one patch onto the skin of the  lower back daily for back pain   ferrous sulfate 325 (65 FE) MG EC tablet Take 325 mg by mouth every Monday, Wednesday, and Friday. With a meal   fish oil-omega-3 fatty acids 1000 MG capsule Take 1 g by mouth every morning.   gabapentin 100 MG capsule Commonly known as: NEURONTIN Take 100 mg by mouth 3 (three) times daily.   hydrocortisone cream 1 % Apply 1 application topically daily as needed for itching.   levothyroxine 75 MCG tablet Commonly  known as: SYNTHROID Take 75 mcg by mouth every morning.   losartan 25 MG tablet Commonly known as: COZAAR Take 25 mg by mouth  every morning.   nystatin powder Commonly known as: MYCOSTATIN/NYSTOP Apply 1 application topically 2 (two) times daily as needed (yeast).   omeprazole 40 MG capsule Commonly known as: PRILOSEC Take 40 mg by mouth every morning.   OxyCODONE HCl (Abuse Deter) 5 MG Taba Commonly known as: OXAYDO Take 2.5 mg by mouth 2 (two) times daily as needed. Give 30 minutes before transportation to ortho appointments.   polyethylene glycol 17 g packet Commonly known as: MIRALAX / GLYCOLAX Take 17 g by mouth daily as needed (constipation).   polyvinyl alcohol 1.4 % ophthalmic solution Commonly known as: LIQUIFILM TEARS Place 1 drop into both eyes 3 (three) times daily.   potassium chloride SA 20 MEQ tablet Commonly known as: KLOR-CON Take 20 mEq by mouth 2 (two) times daily.   PRO-STAT PO Take 30 mLs by mouth daily.   senna 8.6 MG Tabs tablet Commonly known as: SENOKOT Take 2 tablets by mouth at bedtime.   sertraline 25 MG tablet Commonly known as: ZOLOFT Take 75 mg by mouth every morning.   torsemide 20 MG tablet Commonly known as: DEMADEX Take 20 mg by mouth every morning.   zinc oxide 20 % ointment Apply 1 application topically See admin instructions. Apply topically to per/buttocks after every incontinent episode and as needed for redness        Review of Systems  Constitutional:  Negative for appetite change, fatigue and fever.       Chronic fatigue.   HENT:  Positive for hearing loss. Negative for congestion and voice change.   Eyes:  Negative for visual disturbance.  Respiratory:  Positive for shortness of breath. Negative for cough and wheezing.        DOE  Cardiovascular:  Positive for leg swelling. Negative for palpitations.  Gastrointestinal:  Positive for constipation. Negative for abdominal pain, nausea and vomiting.  Genitourinary:   Negative for dysuria and urgency.       Urinary leakage.   Musculoskeletal:  Positive for arthralgias, back pain and gait problem.       R hip pain, lower back pain. Unsteady gait. Frequent falls. Right ankle hard cast.   Skin:  Negative for color change.  Neurological:  Negative for speech difficulty, weakness and light-headedness.       Hx of vertigo, chronic. Memory lapses.   Psychiatric/Behavioral:  Negative for behavioral problems and sleep disturbance. The patient is not nervous/anxious.    Immunization History  Administered Date(s) Administered   Influenza, High Dose Seasonal PF 04/26/2016, 03/22/2017, 05/08/2019   Influenza,inj,Quad PF,6+ Mos 04/27/2018   Influenza-Unspecified 04/22/2011, 04/04/2012, 05/03/2013, 05/04/2013, 04/17/2014, 04/19/2015, 04/26/2016, 04/22/2017   Moderna SARS-COV2 Booster Vaccination 06/09/2020, 12/31/2020   Moderna Sars-Covid-2 Vaccination 07/30/2019, 08/27/2019   PFIZER(Purple Top)SARS-COV-2 Vaccination 04/15/2021   Pneumococcal Polysaccharide-23 08/26/2003, 09/06/2011, 05/15/2019   Pneumococcal-Unspecified 12/20/2013   Td 09/26/2006   Tdap 05/15/2019   Zoster Recombinat (Shingrix) 11/21/2017, 04/08/2018   Zoster, Live 10/25/2007   Pertinent  Health Maintenance Due  Topic Date Due   INFLUENZA VACCINE  02/23/2021   DEXA SCAN  Completed   Fall Risk  10/18/2018 08/23/2018 05/09/2018 05/01/2018 04/10/2018  Falls in the past year? 0 0 Yes Yes No  Comment - - - fell in June/2019 _0  Home (asst living) -  Number falls in past yr: 0 0 1 1 -  Injury with Fall? 0 0 No No -  Risk for fall due to : - - - Impaired balance/gait -  Follow up - - - Falls prevention  discussed -   Functional Status Survey:    Vitals:   05/12/21 1135  BP: (!) 129/53  Pulse: 66  Resp: 18  Temp: 97.9 F (36.6 C)  SpO2: 95%  Weight: 168 lb 4.8 oz (76.3 kg)   Body mass index is 28.89 kg/m. Physical Exam Vitals and nursing note reviewed.  Constitutional:       Appearance: Normal appearance.  HENT:     Head: Normocephalic and atraumatic.     Mouth/Throat:     Mouth: Mucous membranes are moist.  Eyes:     Extraocular Movements: Extraocular movements intact.     Conjunctiva/sclera: Conjunctivae normal.     Pupils: Pupils are equal, round, and reactive to light.  Cardiovascular:     Rate and Rhythm: Normal rate and regular rhythm.     Heart sounds: No murmur heard. Pulmonary:     Breath sounds: No wheezing.  Abdominal:     General: Bowel sounds are normal.     Palpations: Abdomen is soft.     Tenderness: There is no abdominal tenderness. There is no right CVA tenderness, left CVA tenderness, guarding or rebound.  Genitourinary:    Comments: External hemorrhoid per previous examination.  Musculoskeletal:     Cervical back: Normal range of motion and neck supple.     Right lower leg: Edema present.     Left lower leg: Edema present.     Comments: Minimal swelling LLE. Mild swelling right toes, able to wiggle toes. Hard cast R ankle.   Skin:    General: Skin is warm and dry.  Neurological:     General: No focal deficit present.     Mental Status: She is alert. Mental status is at baseline.     Motor: No weakness.     Coordination: Coordination normal.     Gait: Gait abnormal.     Comments: Oriented to person, place.   Psychiatric:        Mood and Affect: Mood normal.        Behavior: Behavior normal.        Thought Content: Thought content normal.    Labs reviewed: Recent Labs    03/28/21 0547 03/30/21 0503 04/04/21 1827  NA 137 137 145  K 4.0 3.5 4.5  CL 104 100 103  CO2 _0 GLUCOSE 117* 101* 104*  BUN 22 36* 27*  CREATININE 1.00 1.31* 0.98  CALCIUM 9.2 8.9 9.7   Recent Labs    06/05/20 0000 03/27/21 0543 04/04/21 1827  AST 20 21 37  ALT _1 ALKPHOS 94 69 95  BILITOT  --  0.4 0.5  PROT  --  7.4 7.9  ALBUMIN 3.9 3.7 3.2*   Recent Labs    06/05/20 0000 10/16/20 1804 03/26/21 0334 03/27/21 0543  03/30/21 0503 03/31/21 0444 04/04/21 1827  WBC 4.4   < > 11.0*   < > 10.7* 9.3 10.3  NEUTROABS 2,213.00  --  8.6*  --   --   --  8.0*  HGB 9.7*   < > 10.0*   < > 7.6* 8.1* 8.2*  HCT 31*   < > 33.7*   < > 24.6* 26.1* 27.8*  MCV  --    < > 91.6   < > 83.4 82.9 85.3  PLT 77*   < > 46*   < > 83* 97* 226   < > = values in this interval not displayed.   Lab Results  Component  Value Date   TSH 4.69 06/05/2020   No results found for: HGBA1C Lab Results  Component Value Date   CHOL 154 04/10/2020   HDL 47 04/10/2020   LDLCALC 87 04/10/2020   TRIG 108 04/10/2020   CHOLHDL 3.8 11/23/2018    Significant Diagnostic Results in last 30 days:  No results found.  Assessment/Plan Chronic constipation HPOA reported the patient's constipation. The patient stated it was x1 a couple of days ago, last BM was 05/11/21, takes Senna II qhs, will schedule MiraLax qod.   Ankle fracture Closed trimalleolar fracture of right ankle: hard cast, able to wiggle toes, Tylenol, Oxycodone for pain, off Tramadol  CKD (chronic kidney disease) stage 3, GFR 30-59 ml/min (HCC) stage 3 eGFR 56 03/2021  Essential hypertension blood pressure is controlled on Losartan   Anemia of chronic disease had work up but no biopsy 2/2 her age, takes Fe 3x/wk, stable. Hgb 8.2 04/04/21  Chronic bilateral low back pain without sciatica  takes Gabapentin, Tylenol. 10/16/20 X-ray Vertebroplasty of T9, T11, L1, and L2 have been performed with stable compression deformities  Acquired hypothyroidism takes Levothyroxine, TSH 3.82 10/20/20, pending f/u TSH  GERD (gastroesophageal reflux disease) Stable, takes Omeprazole   Bilateral leg edema Minimal LLE, mild swelling right toes, takes Torsemide, Bun/creat 27/0.98 04/04/21  Depression with anxiety Her mood is managed,  takes Sertraline, Bupropion, prn Alprazolam-needed.     Family/ staff Communication: plan of care reviewed with the patient and charge nurse.   Labs/tests  ordered: none  Time spend 35 minutes.

## 2021-05-12 NOTE — Assessment & Plan Note (Signed)
stage 3 eGFR 56 03/2021

## 2021-05-12 NOTE — Assessment & Plan Note (Signed)
Her mood is managed,  takes Sertraline, Bupropion, prn Alprazolam-needed.

## 2021-05-12 NOTE — Assessment & Plan Note (Signed)
Closed trimalleolar fracture of right ankle: hard cast, able to wiggle toes, Tylenol, Oxycodone for pain, off Tramadol

## 2021-05-12 NOTE — Assessment & Plan Note (Signed)
takes Gabapentin, Tylenol. 10/16/20 X-ray Vertebroplasty of T9, T11, L1, and L2 have been performed with stable compression deformities

## 2021-05-12 NOTE — Assessment & Plan Note (Signed)
blood pressure is controlled on Losartan 

## 2021-05-12 NOTE — Assessment & Plan Note (Signed)
had work up but no biopsy 2/2 her age, takes Fe 3x/wk, stable. Hgb 8.2 04/04/21

## 2021-05-13 DIAGNOSIS — M6281 Muscle weakness (generalized): Secondary | ICD-10-CM | POA: Diagnosis not present

## 2021-05-13 DIAGNOSIS — S82851D Displaced trimalleolar fracture of right lower leg, subsequent encounter for closed fracture with routine healing: Secondary | ICD-10-CM | POA: Diagnosis not present

## 2021-05-13 DIAGNOSIS — Z9181 History of falling: Secondary | ICD-10-CM | POA: Diagnosis not present

## 2021-05-13 DIAGNOSIS — M545 Low back pain, unspecified: Secondary | ICD-10-CM | POA: Diagnosis not present

## 2021-05-14 DIAGNOSIS — S82851D Displaced trimalleolar fracture of right lower leg, subsequent encounter for closed fracture with routine healing: Secondary | ICD-10-CM | POA: Diagnosis not present

## 2021-05-14 DIAGNOSIS — M6281 Muscle weakness (generalized): Secondary | ICD-10-CM | POA: Diagnosis not present

## 2021-05-14 DIAGNOSIS — M545 Low back pain, unspecified: Secondary | ICD-10-CM | POA: Diagnosis not present

## 2021-05-14 DIAGNOSIS — Z9181 History of falling: Secondary | ICD-10-CM | POA: Diagnosis not present

## 2021-05-18 DIAGNOSIS — S82851D Displaced trimalleolar fracture of right lower leg, subsequent encounter for closed fracture with routine healing: Secondary | ICD-10-CM | POA: Diagnosis not present

## 2021-05-18 DIAGNOSIS — M545 Low back pain, unspecified: Secondary | ICD-10-CM | POA: Diagnosis not present

## 2021-05-18 DIAGNOSIS — Z9181 History of falling: Secondary | ICD-10-CM | POA: Diagnosis not present

## 2021-05-18 DIAGNOSIS — M6281 Muscle weakness (generalized): Secondary | ICD-10-CM | POA: Diagnosis not present

## 2021-05-19 DIAGNOSIS — S82851D Displaced trimalleolar fracture of right lower leg, subsequent encounter for closed fracture with routine healing: Secondary | ICD-10-CM | POA: Diagnosis not present

## 2021-05-19 DIAGNOSIS — M545 Low back pain, unspecified: Secondary | ICD-10-CM | POA: Diagnosis not present

## 2021-05-19 DIAGNOSIS — M6281 Muscle weakness (generalized): Secondary | ICD-10-CM | POA: Diagnosis not present

## 2021-05-19 DIAGNOSIS — Z9181 History of falling: Secondary | ICD-10-CM | POA: Diagnosis not present

## 2021-05-20 DIAGNOSIS — S82851D Displaced trimalleolar fracture of right lower leg, subsequent encounter for closed fracture with routine healing: Secondary | ICD-10-CM | POA: Diagnosis not present

## 2021-05-20 DIAGNOSIS — Z9181 History of falling: Secondary | ICD-10-CM | POA: Diagnosis not present

## 2021-05-20 DIAGNOSIS — M545 Low back pain, unspecified: Secondary | ICD-10-CM | POA: Diagnosis not present

## 2021-05-20 DIAGNOSIS — M6281 Muscle weakness (generalized): Secondary | ICD-10-CM | POA: Diagnosis not present

## 2021-05-22 DIAGNOSIS — S82851D Displaced trimalleolar fracture of right lower leg, subsequent encounter for closed fracture with routine healing: Secondary | ICD-10-CM | POA: Diagnosis not present

## 2021-05-22 DIAGNOSIS — M545 Low back pain, unspecified: Secondary | ICD-10-CM | POA: Diagnosis not present

## 2021-05-22 DIAGNOSIS — Z9181 History of falling: Secondary | ICD-10-CM | POA: Diagnosis not present

## 2021-05-22 DIAGNOSIS — M6281 Muscle weakness (generalized): Secondary | ICD-10-CM | POA: Diagnosis not present

## 2021-05-25 DIAGNOSIS — M6281 Muscle weakness (generalized): Secondary | ICD-10-CM | POA: Diagnosis not present

## 2021-05-25 DIAGNOSIS — M545 Low back pain, unspecified: Secondary | ICD-10-CM | POA: Diagnosis not present

## 2021-05-25 DIAGNOSIS — S82851D Displaced trimalleolar fracture of right lower leg, subsequent encounter for closed fracture with routine healing: Secondary | ICD-10-CM | POA: Diagnosis not present

## 2021-05-25 DIAGNOSIS — Z9181 History of falling: Secondary | ICD-10-CM | POA: Diagnosis not present

## 2021-05-26 DIAGNOSIS — Z9181 History of falling: Secondary | ICD-10-CM | POA: Diagnosis not present

## 2021-05-26 DIAGNOSIS — S82851D Displaced trimalleolar fracture of right lower leg, subsequent encounter for closed fracture with routine healing: Secondary | ICD-10-CM | POA: Diagnosis not present

## 2021-05-26 DIAGNOSIS — M545 Low back pain, unspecified: Secondary | ICD-10-CM | POA: Diagnosis not present

## 2021-05-26 DIAGNOSIS — M6281 Muscle weakness (generalized): Secondary | ICD-10-CM | POA: Diagnosis not present

## 2021-05-28 DIAGNOSIS — M545 Low back pain, unspecified: Secondary | ICD-10-CM | POA: Diagnosis not present

## 2021-05-28 DIAGNOSIS — Z9181 History of falling: Secondary | ICD-10-CM | POA: Diagnosis not present

## 2021-05-28 DIAGNOSIS — M6281 Muscle weakness (generalized): Secondary | ICD-10-CM | POA: Diagnosis not present

## 2021-05-28 DIAGNOSIS — S82851D Displaced trimalleolar fracture of right lower leg, subsequent encounter for closed fracture with routine healing: Secondary | ICD-10-CM | POA: Diagnosis not present

## 2021-05-29 DIAGNOSIS — Z9181 History of falling: Secondary | ICD-10-CM | POA: Diagnosis not present

## 2021-05-29 DIAGNOSIS — M6281 Muscle weakness (generalized): Secondary | ICD-10-CM | POA: Diagnosis not present

## 2021-05-29 DIAGNOSIS — M545 Low back pain, unspecified: Secondary | ICD-10-CM | POA: Diagnosis not present

## 2021-05-29 DIAGNOSIS — S82851D Displaced trimalleolar fracture of right lower leg, subsequent encounter for closed fracture with routine healing: Secondary | ICD-10-CM | POA: Diagnosis not present

## 2021-06-01 DIAGNOSIS — S82851D Displaced trimalleolar fracture of right lower leg, subsequent encounter for closed fracture with routine healing: Secondary | ICD-10-CM | POA: Diagnosis not present

## 2021-06-01 DIAGNOSIS — M6281 Muscle weakness (generalized): Secondary | ICD-10-CM | POA: Diagnosis not present

## 2021-06-01 DIAGNOSIS — Z9181 History of falling: Secondary | ICD-10-CM | POA: Diagnosis not present

## 2021-06-01 DIAGNOSIS — M545 Low back pain, unspecified: Secondary | ICD-10-CM | POA: Diagnosis not present

## 2021-06-02 ENCOUNTER — Encounter: Payer: Self-pay | Admitting: Nurse Practitioner

## 2021-06-02 ENCOUNTER — Non-Acute Institutional Stay (SKILLED_NURSING_FACILITY): Payer: Medicare Other | Admitting: Nurse Practitioner

## 2021-06-02 DIAGNOSIS — G8929 Other chronic pain: Secondary | ICD-10-CM | POA: Diagnosis not present

## 2021-06-02 DIAGNOSIS — N1831 Chronic kidney disease, stage 3a: Secondary | ICD-10-CM

## 2021-06-02 DIAGNOSIS — E039 Hypothyroidism, unspecified: Secondary | ICD-10-CM | POA: Diagnosis not present

## 2021-06-02 DIAGNOSIS — M545 Low back pain, unspecified: Secondary | ICD-10-CM | POA: Diagnosis not present

## 2021-06-02 DIAGNOSIS — I10 Essential (primary) hypertension: Secondary | ICD-10-CM

## 2021-06-02 DIAGNOSIS — Z8781 Personal history of (healed) traumatic fracture: Secondary | ICD-10-CM

## 2021-06-02 DIAGNOSIS — K5909 Other constipation: Secondary | ICD-10-CM

## 2021-06-02 DIAGNOSIS — K219 Gastro-esophageal reflux disease without esophagitis: Secondary | ICD-10-CM

## 2021-06-02 DIAGNOSIS — D638 Anemia in other chronic diseases classified elsewhere: Secondary | ICD-10-CM

## 2021-06-02 DIAGNOSIS — I739 Peripheral vascular disease, unspecified: Secondary | ICD-10-CM | POA: Diagnosis not present

## 2021-06-02 DIAGNOSIS — S82851D Displaced trimalleolar fracture of right lower leg, subsequent encounter for closed fracture with routine healing: Secondary | ICD-10-CM | POA: Diagnosis not present

## 2021-06-02 DIAGNOSIS — M6281 Muscle weakness (generalized): Secondary | ICD-10-CM | POA: Diagnosis not present

## 2021-06-02 DIAGNOSIS — F418 Other specified anxiety disorders: Secondary | ICD-10-CM

## 2021-06-02 DIAGNOSIS — Z9181 History of falling: Secondary | ICD-10-CM | POA: Diagnosis not present

## 2021-06-02 NOTE — Assessment & Plan Note (Signed)
takes Gabapentin, Tylenol. 10/16/20 X-ray Vertebroplasty of T9, T11, L1, and L2 have been performed with stable compression deformities

## 2021-06-02 NOTE — Assessment & Plan Note (Signed)
blood pressure is controlled on Losartan 

## 2021-06-02 NOTE — Progress Notes (Signed)
Location:   [facility] Room Number: 35A Place of Service:  SNF (31) Provider:  Deiontae Rabel X, NP  Virgie Dad, MD  Patient Care Team: Virgie Dad, MD as PCP - General (Internal Medicine) Phila Shoaf X, NP as Nurse Practitioner (Internal Medicine)  Extended Emergency Contact Information Primary Emergency Contact: Uher,[name] Address: [address]          [place], [place] [address] [place] Phone: [phone] Mobile Phone: [phone] Relation: Son Secondary Emergency Contact: Klump, Kathi [place] Phone: [phone] Mobile Phone: [phone] Relation: Other  Code Status:  DNR Goals of care: Advanced Directive information Advanced Directives 06/04/2021  Does Patient Have a Medical Advance Directive? Yes  Type of Paramedic of [place];Out of facility DNR (pink MOST or yellow form)  Does patient want to make changes to medical advance directive? No - Patient declined  Copy of [place] in Chart? Yes - validated most recent copy scanned in chart (See row information)  Pre-existing out of facility DNR order (yellow form or pink MOST form) Pink MOST form placed in chart (order not valid for inpatient use);Yellow form placed in chart (order not valid for inpatient use)     Chief Complaint  Patient presents with   Medical Management of Chronic Issues    Routine   Quality Metric Gaps    Pneumonia, Flu    HPI:  Pt is a 87 y.o. female seen today for medical management of chronic diseases.    Closed trimalleolar fracture of right ankle: healing, takes Tylenol, Oxycodone for pain, off Tramadol.  05/18/21 Ortho WB with boot 50%.  CKD, stage 3 eGFR 56 03/2021             Hx of recurrent UTI             HTN, blood pressure is controlled on Losartan              Hemorrhoids, no injury.                          Anemia, had work up but no biopsy 2/2 her age, takes Fe 3x/wk,  stable. Hgb 8.2 04/04/21             Lower back pain, takes Gabapentin, Tylenol. 10/16/20 X-ray Vertebroplasty of T9, T11, L1, and L2 have been performed with stable compression deformities             Hypothyroidism, takes Levothyroxine, TSH 3.82 10/20/20             GERD, takes Omeprazole              Edema BLE, minimal, takes Torsemide, Bun/creat 27/0.98 04/04/21             Depression, takes Sertraline, Bupropion, Alprazolam             constipation, takes MiraLax, Senna.    Past Medical History:  Diagnosis Date   Anemia    Anxiety    Chronotropic incompetence 12/2012    potentially medication related; noted on CPET    DDD (degenerative disc disease)    Eczema    GERD (gastroesophageal reflux disease)    H/O hiatal hernia    Hx: UTI (urinary tract infection)    Hyperlipidemia    Hypertension    Hypothyroidism    Osteopenia    Septicemia ([facility]) 2002  following UTI   Vertigo, benign positional    Past Surgical History:  Procedure Laterality Date   BREAST SURGERY     left biopsy   CATARACT EXTRACTION Right    2 weeks ago   CPET / MET - PFTS     Consistent with chronotropic incompetence; See attached report in the results section   DILATION AND CURETTAGE OF UTERUS     x 2   DOPPLER ECHOCARDIOGRAPHY  01/10/2013   Normal LV size and function. Normal EF. Air sclerosis but no stenosis   ESOPHAGOGASTRODUODENOSCOPY  05/04/2012   Procedure: ESOPHAGOGASTRODUODENOSCOPY (EGD);  Surgeon: Inda Castle, MD;  Location: [facility];  Service: Endoscopy;  Laterality: N/A;   EYE SURGERY     cataract extraction with ILO  ? eye   IR KYPHO EA ADDL LEVEL THORACIC OR LUMBAR  10/28/2016   IR KYPHO LUMBAR INC FX REDUCE BONE BX UNI/BIL CANNULATION INC/IMAGING  10/28/2016   IR KYPHO THORACIC WITH BONE BIOPSY  12/24/2016   IR RADIOLOGIST EVAL & MGMT  11/11/2016   KNEE ARTHROSCOPY  2011   right   ORIF ANKLE FRACTURE Right 03/27/2021   Procedure: OPEN REDUCTION INTERNAL FIXATION (ORIF) ANKLE FRACTURE;   Surgeon: Marchia Bond, MD;  Location: [facility];  Service: Orthopedics;  Laterality: Right;   TONSILLECTOMY     TOTAL KNEE ARTHROPLASTY  04/24/2012   Procedure: TOTAL KNEE ARTHROPLASTY;  Surgeon: Gearlean Alf, MD;  Location: [facility];  Service: Orthopedics;  Laterality: Right;   TRANSTHORACIC ECHOCARDIOGRAM  02/2018   Ordered by PCP for "congestive heart failure " -EF 60 M 65%.  GR 1 DD.  No R WMA--- > ESSENTIALLY NORMAL    No Known Allergies  Allergies as of 06/02/2021   No Known Allergies      Medication List        Accurate as of June 02, 2021 11:59 PM. If you have any questions, ask your nurse or doctor.          acetaminophen 500 MG tablet Commonly known as: TYLENOL Take 1,000 mg by mouth 3 (three) times daily as needed for mild pain.   ALPRAZolam 0.25 MG tablet Commonly known as: XANAX Take 1 tablet (0.25 mg total) by mouth at bedtime as needed for sleep or anxiety.   atorvastatin 10 MG tablet Commonly known as: LIPITOR Take 5 mg by mouth at bedtime.   Biofreeze 4 % Gel Generic drug: Menthol (Topical Analgesic) Apply topically 2 (two) times daily as needed. Apply thin layer to left thigh   buPROPion 150 MG 12 hr tablet Commonly known as: WELLBUTRIN SR Take 1 tablet (150 mg total) by mouth 2 (two) times daily after a meal.   calcium citrate-vitamin D 315-200 MG-UNIT tablet Commonly known as: CITRACAL+D Take 1 tablet by mouth 2 (two) times daily.   Camphor-Menthol-Methyl Sal 3.07-31-08 % Ptch Place 1 patch onto the skin See admin instructions. Apply one patch onto the skin of the  lower back daily for back pain   ferrous sulfate 325 (65 FE) MG EC tablet Take 325 mg by mouth every Monday, Wednesday, and Friday. With a meal   fish oil-omega-3 fatty acids 1000 MG capsule Take 1 g by mouth every morning.   gabapentin 100 MG capsule Commonly known as: NEURONTIN Take 100 mg by mouth 3 (three) times daily.   hydrocortisone cream 1 % Apply 1 application  topically daily as needed for itching.   levothyroxine 75 MCG tablet Commonly known as: SYNTHROID Take 75  mcg by mouth every morning.   losartan 25 MG tablet Commonly known as: COZAAR Take 25 mg by mouth every morning.   nystatin powder Commonly known as: MYCOSTATIN/NYSTOP Apply 1 application topically 2 (two) times daily as needed (yeast).   omeprazole 40 MG capsule Commonly known as: PRILOSEC Take 40 mg by mouth every morning.   OxyCODONE HCl (Abuse Deter) 5 MG Taba Commonly known as: OXAYDO Take 2.5 mg by mouth 2 (two) times daily as needed. Give 30 minutes before transportation to ortho appointments.   polyethylene glycol 17 g packet Commonly known as: MIRALAX / GLYCOLAX Take 17 g by mouth daily as needed (constipation).   polyvinyl alcohol 1.4 % ophthalmic solution Commonly known as: LIQUIFILM TEARS Place 1 drop into both eyes 3 (three) times daily.   potassium chloride SA 20 MEQ tablet Commonly known as: KLOR-CON Take 20 mEq by mouth 2 (two) times daily.   PRO-STAT PO Take 30 mLs by mouth daily.   senna 8.6 MG Tabs tablet Commonly known as: SENOKOT Take 2 tablets by mouth at bedtime.   sertraline 25 MG tablet Commonly known as: ZOLOFT Take 75 mg by mouth every morning.   torsemide 20 MG tablet Commonly known as: DEMADEX Take 20 mg by mouth every morning.   zinc oxide 20 % ointment Apply 1 application topically See admin instructions. Apply topically to per/buttocks after every incontinent episode and as needed for redness        Review of Systems  Constitutional:  Negative for appetite change, fatigue and fever.       Chronic fatigue.   HENT:  Positive for hearing loss. Negative for congestion and voice change.   Eyes:  Negative for visual disturbance.  Respiratory:  Positive for shortness of breath. Negative for cough and wheezing.        DOE  Cardiovascular:  Positive for leg swelling. Negative for palpitations.  Gastrointestinal:  Negative for  abdominal pain and constipation.  Genitourinary:  Negative for dysuria and urgency.       Urinary leakage.   Musculoskeletal:  Positive for arthralgias, back pain and gait problem.       R hip pain, lower back pain. Unsteady gait. Frequent falls. Right ankle in boot.   Skin:  Negative for color change.  Neurological:  Negative for speech difficulty, weakness and headaches.       Hx of vertigo, chronic. Memory lapses.   Psychiatric/Behavioral:  Negative for behavioral problems and sleep disturbance. The patient is not nervous/anxious.    Immunization History  Administered Date(s) Administered   Influenza, High Dose Seasonal PF 04/26/2016, 03/22/2017, 05/08/2019   Influenza,inj,Quad PF,6+ Mos 04/27/2018   Influenza-Unspecified 04/22/2011, 04/04/2012, 05/03/2013, 05/04/2013, 04/17/2014, 04/19/2015, 04/26/2016, 04/22/2017, 05/20/2021   Moderna SARS-COV2 Booster Vaccination 06/09/2020, 12/31/2020   Moderna Sars-Covid-2 Vaccination 07/30/2019, 08/27/2019   PFIZER(Purple Top)SARS-COV-2 Vaccination 04/15/2021   Pneumococcal Polysaccharide-23 08/26/2003, 09/06/2011, 05/15/2019   Pneumococcal-Unspecified 12/20/2013   Td 09/26/2006   Tdap 05/15/2019   Zoster Recombinat (Shingrix) 11/21/2017, 04/08/2018   Zoster, Live 10/25/2007   Pertinent  Health Maintenance Due  Topic Date Due   INFLUENZA VACCINE  Completed   DEXA SCAN  Completed   Fall Risk 03/30/2021 03/31/2021 03/31/2021 03/31/2021 04/04/2021  Falls in the past year? - - - - -  Was there an injury with Fall? - - - - -  Fall Risk Category Calculator - - - - -  Fall Risk Category - - - - -  Patient Fall Risk Level High fall  risk High fall risk High fall risk High fall risk High fall risk  Patient at Risk for Falls Due to - - - - -  Fall risk Follow up - - - - -   Functional Status Survey:    Vitals:   06/02/21 1310  BP: (!) 137/57  Pulse: 64  Resp: 20  Temp: (!) 97.5 F (36.4 C)  SpO2: 97%  Weight: 179 lb 1.6 oz (81.2 kg)  Height:  '5\' 4"'  (1.626 m)   Body mass index is 30.74 kg/m. Physical Exam Vitals and nursing note reviewed.  Constitutional:      Appearance: Normal appearance.  HENT:     Head: Normocephalic and atraumatic.     Mouth/Throat:     Mouth: Mucous membranes are moist.  Eyes:     Extraocular Movements: Extraocular movements intact.     Conjunctiva/sclera: Conjunctivae normal.     Pupils: Pupils are equal, round, and reactive to light.  Cardiovascular:     Rate and Rhythm: Normal rate and regular rhythm.     Heart sounds: No murmur heard. Pulmonary:     Breath sounds: No wheezing.  Abdominal:     General: Bowel sounds are normal.     Palpations: Abdomen is soft.     Tenderness: There is no abdominal tenderness. There is no right CVA tenderness, left CVA tenderness, guarding or rebound.  Genitourinary:    Comments: External hemorrhoid per previous examination.  Musculoskeletal:        General: Swelling present.     Cervical back: Normal range of motion and neck supple.     Right lower leg: Edema present.     Left lower leg: Edema present.     Comments: Minimal swelling BLE, R>L, mostly the right ankle swelling, pain with movement  Skin:    General: Skin is warm and dry.     Comments: Right right ankle surgical incisions are healed.   Neurological:     General: No focal deficit present.     Mental Status: She is alert. Mental status is at baseline.     Motor: No weakness.     Coordination: Coordination normal.     Gait: Gait abnormal.     Comments: Oriented to person, place.   Psychiatric:        Mood and Affect: Mood normal.        Behavior: Behavior normal.        Thought Content: Thought content normal.    Labs reviewed: Recent Labs    03/28/21 0547 03/30/21 0503 04/04/21 1827  NA 137 137 145  K 4.0 3.5 4.5  CL 104 100 103  CO2 '28 31 31  ' GLUCOSE 117* 101* 104*  BUN 22 36* 27*  CREATININE 1.00 1.31* 0.98  CALCIUM 9.2 8.9 9.7   Recent Labs    06/05/20 0000  03/27/21 0543 04/04/21 1827  AST 20 21 37  ALT '14 16 24  ' ALKPHOS 94 69 95  BILITOT  --  0.4 0.5  PROT  --  7.4 7.9  ALBUMIN 3.9 3.7 3.2*   Recent Labs    03/26/21 0334 03/27/21 0543 03/30/21 0503 03/31/21 0444 04/03/21 0000 04/04/21 1827  WBC 11.0*   < > 10.7* 9.3 15.0 10.3  NEUTROABS 8.6*  --   --   --  12,630.00 8.0*  HGB 10.0*   < > 7.6* 8.1* 8.2* 8.2*  HCT 33.7*   < > 24.6* 26.1* 26* 27.8*  MCV 91.6   < >  83.4 82.9  --  85.3  PLT 46*   < > 83* 97* 137* 226   < > = values in this interval not displayed.   Lab Results  Component Value Date   TSH 4.69 06/05/2020   No results found for: HGBA1C Lab Results  Component Value Date   CHOL 154 04/10/2020   HDL 47 04/10/2020   LDLCALC 87 04/10/2020   TRIG 108 04/10/2020   CHOLHDL 3.8 11/23/2018    Significant Diagnostic Results in last 30 days:  No results found.  Assessment/Plan Essential hypertension blood pressure is controlled on Losartan   Anemia of chronic disease had work up but no biopsy 2/2 her age, takes Fe 3x/wk, stable. Hgb 8.2 04/04/21  Chronic back pain  takes Gabapentin, Tylenol. 10/16/20 X-ray Vertebroplasty of T9, T11, L1, and L2 have been performed with stable compression deformities  Acquired hypothyroidism takes Levothyroxine, TSH 3.82 10/20/20  GERD (gastroesophageal reflux disease) Stable, takes Omeprazole   PVD (peripheral vascular disease) (HCC)  minimal, takes Torsemide, Bun/creat 27/0.98 04/04/21  Depression with anxiety Her mood is stable,  takes Sertraline, Bupropion, Alprazolam  Chronic constipation Stable,  takes MiraLax, Senna.    CKD (chronic kidney disease) stage 3, GFR 30-59 ml/min (HCC) stage 3 eGFR 56 03/2021  History of fracture of right ankle Healing closed trimalleolar fracture of right ankle, takes Tylenol, Oxycodone for pain, off Tramadol. 05/18/21 Ortho WB with boot 50%.     Family/ staff Communication: plan of care reviewed with the patient and charge nurse.    Labs/tests ordered:  none  Time spend 35 minutes.

## 2021-06-02 NOTE — Assessment & Plan Note (Signed)
Stable,  takes Omeprazole 

## 2021-06-02 NOTE — Assessment & Plan Note (Signed)
had work up but no biopsy 2/2 her age, takes Fe 3x/wk, stable. Hgb 8.2 04/04/21

## 2021-06-02 NOTE — Assessment & Plan Note (Signed)
Her mood is stable,  takes Sertraline, Bupropion, Alprazolam

## 2021-06-02 NOTE — Assessment & Plan Note (Signed)
minimal, takes Torsemide, Bun/creat 27/0.98 04/04/21

## 2021-06-02 NOTE — Assessment & Plan Note (Addendum)
Healing closed trimalleolar fracture of right ankle, takes Tylenol, Oxycodone for pain, off Tramadol. 05/18/21 Ortho WB with boot 50%.

## 2021-06-02 NOTE — Assessment & Plan Note (Signed)
stage 3 eGFR 56 03/2021

## 2021-06-02 NOTE — Assessment & Plan Note (Signed)
Stable, takes MiraLax, Senna. 

## 2021-06-02 NOTE — Assessment & Plan Note (Signed)
takes Levothyroxine, TSH 3.82 10/20/20

## 2021-06-03 DIAGNOSIS — M545 Low back pain, unspecified: Secondary | ICD-10-CM | POA: Diagnosis not present

## 2021-06-03 DIAGNOSIS — S82851D Displaced trimalleolar fracture of right lower leg, subsequent encounter for closed fracture with routine healing: Secondary | ICD-10-CM | POA: Diagnosis not present

## 2021-06-03 DIAGNOSIS — Z9181 History of falling: Secondary | ICD-10-CM | POA: Diagnosis not present

## 2021-06-03 DIAGNOSIS — M6281 Muscle weakness (generalized): Secondary | ICD-10-CM | POA: Diagnosis not present

## 2021-06-04 ENCOUNTER — Non-Acute Institutional Stay (SKILLED_NURSING_FACILITY): Payer: Medicare Other | Admitting: Internal Medicine

## 2021-06-04 ENCOUNTER — Encounter: Payer: Self-pay | Admitting: Nurse Practitioner

## 2021-06-04 ENCOUNTER — Encounter: Payer: Self-pay | Admitting: Internal Medicine

## 2021-06-04 DIAGNOSIS — I10 Essential (primary) hypertension: Secondary | ICD-10-CM | POA: Diagnosis not present

## 2021-06-04 DIAGNOSIS — F418 Other specified anxiety disorders: Secondary | ICD-10-CM

## 2021-06-04 DIAGNOSIS — G8929 Other chronic pain: Secondary | ICD-10-CM | POA: Diagnosis not present

## 2021-06-04 DIAGNOSIS — E039 Hypothyroidism, unspecified: Secondary | ICD-10-CM

## 2021-06-04 DIAGNOSIS — L89626 Pressure-induced deep tissue damage of left heel: Secondary | ICD-10-CM

## 2021-06-04 DIAGNOSIS — D638 Anemia in other chronic diseases classified elsewhere: Secondary | ICD-10-CM

## 2021-06-04 DIAGNOSIS — S82851D Displaced trimalleolar fracture of right lower leg, subsequent encounter for closed fracture with routine healing: Secondary | ICD-10-CM | POA: Diagnosis not present

## 2021-06-04 DIAGNOSIS — M545 Low back pain, unspecified: Secondary | ICD-10-CM

## 2021-06-04 DIAGNOSIS — M6281 Muscle weakness (generalized): Secondary | ICD-10-CM | POA: Diagnosis not present

## 2021-06-04 DIAGNOSIS — R600 Localized edema: Secondary | ICD-10-CM

## 2021-06-04 DIAGNOSIS — K219 Gastro-esophageal reflux disease without esophagitis: Secondary | ICD-10-CM

## 2021-06-04 DIAGNOSIS — Z9181 History of falling: Secondary | ICD-10-CM | POA: Diagnosis not present

## 2021-06-04 NOTE — Progress Notes (Signed)
Location:   [facility] Room Number: 35 Place of Service:  SNF [phone]) Provider:  Veleta Miners MD  Virgie Dad, MD  Patient Care Team: Virgie Dad, MD as PCP - General (Internal Medicine) Mast, Man X, NP as Nurse Practitioner (Internal Medicine)  Extended Emergency Contact Information Primary Emergency Contact: Quinto,[name] Address: [address]          [place], [place] [address] [place] Phone: [phone] Mobile Phone: [phone] Relation: Son Secondary Emergency Contact: Neenan, Kathi [place] Phone: [phone] Mobile Phone: [phone] Relation: Other  Code Status:  DNR Managed Care Goals of care: Advanced Directive information Advanced Directives 06/04/2021  Does Patient Have a Medical Advance Directive? Yes  Type of Paramedic of [place];Out of facility DNR (pink MOST or yellow form)  Does patient want to make changes to medical advance directive? No - Patient declined  Copy of [place] in Chart? Yes - validated most recent copy scanned in chart (See row information)  Pre-existing out of facility DNR order (yellow form or pink MOST form) Pink MOST form placed in chart (order not valid for inpatient use);Yellow form placed in chart (order not valid for inpatient use)     Chief Complaint  Patient presents with   Acute Visit    Wound    HPI:  Pt is a 87 y.o. female seen today for an acute visit for Follow wound  Admitted in the hospital From 9/1-9/7 for Right Trimalleolar Fracture. S/P ORIF   Patient has h/o Pancytopenia , h/o Chronic Back Pain, Hypertension, Hypothyroidism, Hypertension, Recurrent Cystitis, Osteoporosis and Major Depression    She [place] in [place]. Was found to have Right Trimalleolar Ankle Fracture Underwent ORIF and Anterior Talofibular Ligament repair on 09/02   She is now in SNF for Therapy and will now stay here She is TDWB  on that leg  She had developed a pressure DTI in left leg The scab has now come off and she has small area open now with Bleeding She c/o Some pain but no discharge or Odor  Patient continues to make slow progress Pain is controlled. Says her Back pain is now much better Mood was good today Appetite is good. Has lost few pounds but overall stable   Past Medical History:  Diagnosis Date   Anemia    Anxiety    Chronotropic incompetence 12/2012    potentially medication related; noted on CPET    DDD (degenerative disc disease)    Eczema    GERD (gastroesophageal reflux disease)    H/O hiatal hernia    Hx: UTI (urinary tract infection)    Hyperlipidemia    Hypertension    Hypothyroidism    Osteopenia    Septicemia ([facility]) 2002   following UTI   Vertigo, benign positional    Past Surgical History:  Procedure Laterality Date   BREAST SURGERY     left biopsy   CATARACT EXTRACTION Right    2 weeks ago   CPET / MET - PFTS     Consistent with chronotropic incompetence; See attached report in the results section   DILATION AND CURETTAGE OF UTERUS     x 2   DOPPLER ECHOCARDIOGRAPHY  01/10/2013   Normal LV size and function. Normal EF. Air sclerosis but no stenosis   ESOPHAGOGASTRODUODENOSCOPY  05/04/2012   Procedure: ESOPHAGOGASTRODUODENOSCOPY (EGD);  Surgeon: Inda Castle, MD;  Location:  [facility];  Service: Endoscopy;  Laterality: N/A;   EYE SURGERY     cataract extraction with ILO  ? eye   IR KYPHO EA ADDL LEVEL THORACIC OR LUMBAR  10/28/2016   IR KYPHO LUMBAR INC FX REDUCE BONE BX UNI/BIL CANNULATION INC/IMAGING  10/28/2016   IR KYPHO THORACIC WITH BONE BIOPSY  12/24/2016   IR RADIOLOGIST EVAL & MGMT  11/11/2016   KNEE ARTHROSCOPY  2011   right   ORIF ANKLE FRACTURE Right 03/27/2021   Procedure: OPEN REDUCTION INTERNAL FIXATION (ORIF) ANKLE FRACTURE;  Surgeon: Marchia Bond, MD;  Location: [facility];  Service: Orthopedics;  Laterality: Right;   TONSILLECTOMY     TOTAL KNEE  ARTHROPLASTY  04/24/2012   Procedure: TOTAL KNEE ARTHROPLASTY;  Surgeon: Gearlean Alf, MD;  Location: [facility];  Service: Orthopedics;  Laterality: Right;   TRANSTHORACIC ECHOCARDIOGRAM  02/2018   Ordered by PCP for "congestive heart failure " -EF 60 M 65%.  GR 1 DD.  No R WMA--- > ESSENTIALLY NORMAL    No Known Allergies  Allergies as of 06/04/2021   No Known Allergies      Medication List        Accurate as of June 04, 2021  9:52 AM. If you have any questions, ask your nurse or doctor.          acetaminophen 500 MG tablet Commonly known as: TYLENOL Take 1,000 mg by mouth 3 (three) times daily as needed for mild pain.   ALPRAZolam 0.25 MG tablet Commonly known as: XANAX Take 1 tablet (0.25 mg total) by mouth at bedtime as needed for sleep or anxiety.   atorvastatin 10 MG tablet Commonly known as: LIPITOR Take 5 mg by mouth at bedtime.   Biofreeze 4 % Gel Generic drug: Menthol (Topical Analgesic) Apply topically 2 (two) times daily as needed. Apply thin layer to left thigh   buPROPion 150 MG 12 hr tablet Commonly known as: WELLBUTRIN SR Take 1 tablet (150 mg total) by mouth 2 (two) times daily after a meal.   calcium citrate-vitamin D 315-200 MG-UNIT tablet Commonly known as: CITRACAL+D Take 1 tablet by mouth 2 (two) times daily.   Camphor-Menthol-Methyl Sal 3.07-31-08 % Ptch Place 1 patch onto the skin See admin instructions. Apply one patch onto the skin of the  lower back daily for back pain   ferrous sulfate 325 (65 FE) MG EC tablet Take 325 mg by mouth every Monday, Wednesday, and Friday. With a meal   fish oil-omega-3 fatty acids 1000 MG capsule Take 1 g by mouth every morning.   gabapentin 100 MG capsule Commonly known as: NEURONTIN Take 100 mg by mouth 3 (three) times daily.   hydrocortisone cream 1 % Apply 1 application topically daily as needed for itching.   levothyroxine 75 MCG tablet Commonly known as: SYNTHROID Take 75 mcg by mouth  every morning.   losartan 25 MG tablet Commonly known as: COZAAR Take 25 mg by mouth every morning.   nystatin powder Commonly known as: MYCOSTATIN/NYSTOP Apply 1 application topically 2 (two) times daily as needed (yeast).   omeprazole 40 MG capsule Commonly known as: PRILOSEC Take 40 mg by mouth every morning.   OxyCODONE HCl (Abuse Deter) 5 MG Taba Commonly known as: OXAYDO Take 2.5 mg by mouth 2 (two) times daily as needed. Give 30 minutes before transportation to ortho appointments.   polyethylene glycol 17 g packet Commonly known as: MIRALAX / GLYCOLAX Take 17 g by mouth daily as  needed (constipation).   polyvinyl alcohol 1.4 % ophthalmic solution Commonly known as: LIQUIFILM TEARS Place 1 drop into both eyes 3 (three) times daily.   potassium chloride SA 20 MEQ tablet Commonly known as: KLOR-CON Take 20 mEq by mouth 2 (two) times daily.   PRO-STAT PO Take 30 mLs by mouth daily.   senna 8.6 MG Tabs tablet Commonly known as: SENOKOT Take 2 tablets by mouth at bedtime.   sertraline 25 MG tablet Commonly known as: ZOLOFT Take 75 mg by mouth every morning.   torsemide 20 MG tablet Commonly known as: DEMADEX Take 20 mg by mouth every morning.   zinc oxide 20 % ointment Apply 1 application topically See admin instructions. Apply topically to per/buttocks after every incontinent episode and as needed for redness        Review of Systems  Constitutional: Negative.   HENT: Negative.    Respiratory: Negative.    Cardiovascular: Negative.   Gastrointestinal: Negative.   Genitourinary: Negative.   Musculoskeletal:  Positive for arthralgias, back pain, gait problem and myalgias.  Skin:  Positive for wound.  Neurological:  Positive for weakness.  Psychiatric/Behavioral: Negative.     Immunization History  Administered Date(s) Administered   Influenza, High Dose Seasonal PF 04/26/2016, 03/22/2017, 05/08/2019   Influenza,inj,Quad PF,6+ Mos 04/27/2018    Influenza-Unspecified 04/22/2011, 04/04/2012, 05/03/2013, 05/04/2013, 04/17/2014, 04/19/2015, 04/26/2016, 04/22/2017, 05/20/2021   Moderna SARS-COV2 Booster Vaccination 06/09/2020, 12/31/2020   Moderna Sars-Covid-2 Vaccination 07/30/2019, 08/27/2019   PFIZER(Purple Top)SARS-COV-2 Vaccination 04/15/2021   Pneumococcal Polysaccharide-23 08/26/2003, 09/06/2011, 05/15/2019   Pneumococcal-Unspecified 12/20/2013   Td 09/26/2006   Tdap 05/15/2019   Zoster Recombinat (Shingrix) 11/21/2017, 04/08/2018   Zoster, Live 10/25/2007   Pertinent  Health Maintenance Due  Topic Date Due   INFLUENZA VACCINE  Completed   DEXA SCAN  Completed   Fall Risk 03/30/2021 03/31/2021 03/31/2021 03/31/2021 04/04/2021  Falls in the past year? - - - - -  Was there an injury with Fall? - - - - -  Fall Risk Category Calculator - - - - -  Fall Risk Category - - - - -  Patient Fall Risk Level High fall risk High fall risk High fall risk High fall risk High fall risk  Patient at Risk for Falls Due to - - - - -  Fall risk Follow up - - - - -   Functional Status Survey:    Vitals:   06/04/21 0942  BP: 135/60  Pulse: 60  Resp: 18  Temp: 97.7 F (36.5 C)  SpO2: 96%  Weight: 174 lb 1.6 oz (79 kg)  Height: '5\' 4"'  (1.626 m)   Body mass index is 29.88 kg/m. Physical Exam Vitals reviewed.  Constitutional:      Appearance: Normal appearance.  HENT:     Head: Normocephalic.     Nose: Nose normal.     Mouth/Throat:     Mouth: Mucous membranes are moist.     Pharynx: Oropharynx is clear.  Eyes:     Pupils: Pupils are equal, round, and reactive to light.  Cardiovascular:     Rate and Rhythm: Normal rate and regular rhythm.     Pulses: Normal pulses.     Heart sounds: Normal heart sounds.  Pulmonary:     Effort: Pulmonary effort is normal.     Breath sounds: Normal breath sounds.  Abdominal:     General: Abdomen is flat. Bowel sounds are normal.     Palpations: Abdomen is soft.  Musculoskeletal:  Cervical back:  Neck supple.     Comments: Left Heel Scab off the pressure wound Small clear area with no necrosis Some bleeding which stopped with pressure  Skin:    General: Skin is warm and dry.  Neurological:     General: No focal deficit present.     Mental Status: She is alert and oriented to person, place, and time.  Psychiatric:        Mood and Affect: Mood normal.        Thought Content: Thought content normal.    Labs reviewed: Recent Labs    03/28/21 0547 03/30/21 0503 04/04/21 1827  NA 137 137 145  K 4.0 3.5 4.5  CL 104 100 103  CO2 '28 31 31  ' GLUCOSE 117* 101* 104*  BUN 22 36* 27*  CREATININE 1.00 1.31* 0.98  CALCIUM 9.2 8.9 9.7   Recent Labs    06/05/20 0000 03/27/21 0543 04/04/21 1827  AST 20 21 37  ALT '14 16 24  ' ALKPHOS 94 69 95  BILITOT  --  0.4 0.5  PROT  --  7.4 7.9  ALBUMIN 3.9 3.7 3.2*   Recent Labs    03/26/21 0334 03/27/21 0543 03/30/21 0503 03/31/21 0444 04/03/21 0000 04/04/21 1827  WBC 11.0*   < > 10.7* 9.3 15.0 10.3  NEUTROABS 8.6*  --   --   --  12,630.00 8.0*  HGB 10.0*   < > 7.6* 8.1* 8.2* 8.2*  HCT 33.7*   < > 24.6* 26.1* 26* 27.8*  MCV 91.6   < > 83.4 82.9  --  85.3  PLT 46*   < > 83* 97* 137* 226   < > = values in this interval not displayed.   Lab Results  Component Value Date   TSH 4.69 06/05/2020   No results found for: HGBA1C Lab Results  Component Value Date   CHOL 154 04/10/2020   HDL 47 04/10/2020   LDLCALC 87 04/10/2020   TRIG 108 04/10/2020   CHOLHDL 3.8 11/23/2018    Significant Diagnostic Results in last 30 days:  No results found.  Assessment/Plan Pressure injury of deep tissue of left heel Continue Hydrogel with Foam dressing Wears booty when in bed to keep pressure off Essential hypertension Continue Losartan Pancytopenia She has had work up for this by Dr Alen Blew in the past He said no BM biopsy due to her age We will continue to follow and support her  Chronic bilateral low back pain without  sciatica Says it is much better On oxycodone PRn Acquired hypothyroidism TSH normal in 11/21 GERD On Prilosec Depression with anxiety On Wellbutrin and Zoloft Bilateral leg edema Torsemide  Closed trimalleolar fracture of right ankle  TDWB Doing well with therapy Plans to stay in SNF   Family/ staff Communication:   Labs/tests ordered:

## 2021-06-05 DIAGNOSIS — M545 Low back pain, unspecified: Secondary | ICD-10-CM | POA: Diagnosis not present

## 2021-06-05 DIAGNOSIS — S82851D Displaced trimalleolar fracture of right lower leg, subsequent encounter for closed fracture with routine healing: Secondary | ICD-10-CM | POA: Diagnosis not present

## 2021-06-05 DIAGNOSIS — Z9181 History of falling: Secondary | ICD-10-CM | POA: Diagnosis not present

## 2021-06-05 DIAGNOSIS — M6281 Muscle weakness (generalized): Secondary | ICD-10-CM | POA: Diagnosis not present

## 2021-06-08 DIAGNOSIS — S82851D Displaced trimalleolar fracture of right lower leg, subsequent encounter for closed fracture with routine healing: Secondary | ICD-10-CM | POA: Diagnosis not present

## 2021-06-08 DIAGNOSIS — M545 Low back pain, unspecified: Secondary | ICD-10-CM | POA: Diagnosis not present

## 2021-06-08 DIAGNOSIS — Z9181 History of falling: Secondary | ICD-10-CM | POA: Diagnosis not present

## 2021-06-08 DIAGNOSIS — M6281 Muscle weakness (generalized): Secondary | ICD-10-CM | POA: Diagnosis not present

## 2021-06-09 DIAGNOSIS — S82851D Displaced trimalleolar fracture of right lower leg, subsequent encounter for closed fracture with routine healing: Secondary | ICD-10-CM | POA: Diagnosis not present

## 2021-06-09 DIAGNOSIS — Z9181 History of falling: Secondary | ICD-10-CM | POA: Diagnosis not present

## 2021-06-09 DIAGNOSIS — M545 Low back pain, unspecified: Secondary | ICD-10-CM | POA: Diagnosis not present

## 2021-06-09 DIAGNOSIS — M6281 Muscle weakness (generalized): Secondary | ICD-10-CM | POA: Diagnosis not present

## 2021-06-10 DIAGNOSIS — M545 Low back pain, unspecified: Secondary | ICD-10-CM | POA: Diagnosis not present

## 2021-06-10 DIAGNOSIS — Z9181 History of falling: Secondary | ICD-10-CM | POA: Diagnosis not present

## 2021-06-10 DIAGNOSIS — S82851D Displaced trimalleolar fracture of right lower leg, subsequent encounter for closed fracture with routine healing: Secondary | ICD-10-CM | POA: Diagnosis not present

## 2021-06-10 DIAGNOSIS — M6281 Muscle weakness (generalized): Secondary | ICD-10-CM | POA: Diagnosis not present

## 2021-06-11 DIAGNOSIS — S82851D Displaced trimalleolar fracture of right lower leg, subsequent encounter for closed fracture with routine healing: Secondary | ICD-10-CM | POA: Diagnosis not present

## 2021-06-11 DIAGNOSIS — Z9181 History of falling: Secondary | ICD-10-CM | POA: Diagnosis not present

## 2021-06-11 DIAGNOSIS — M6281 Muscle weakness (generalized): Secondary | ICD-10-CM | POA: Diagnosis not present

## 2021-06-11 DIAGNOSIS — M545 Low back pain, unspecified: Secondary | ICD-10-CM | POA: Diagnosis not present

## 2021-06-12 DIAGNOSIS — M545 Low back pain, unspecified: Secondary | ICD-10-CM | POA: Diagnosis not present

## 2021-06-12 DIAGNOSIS — S82851D Displaced trimalleolar fracture of right lower leg, subsequent encounter for closed fracture with routine healing: Secondary | ICD-10-CM | POA: Diagnosis not present

## 2021-06-12 DIAGNOSIS — M6281 Muscle weakness (generalized): Secondary | ICD-10-CM | POA: Diagnosis not present

## 2021-06-12 DIAGNOSIS — Z9181 History of falling: Secondary | ICD-10-CM | POA: Diagnosis not present

## 2021-06-14 DIAGNOSIS — S82851D Displaced trimalleolar fracture of right lower leg, subsequent encounter for closed fracture with routine healing: Secondary | ICD-10-CM | POA: Diagnosis not present

## 2021-06-14 DIAGNOSIS — M6281 Muscle weakness (generalized): Secondary | ICD-10-CM | POA: Diagnosis not present

## 2021-06-14 DIAGNOSIS — M545 Low back pain, unspecified: Secondary | ICD-10-CM | POA: Diagnosis not present

## 2021-06-14 DIAGNOSIS — Z9181 History of falling: Secondary | ICD-10-CM | POA: Diagnosis not present

## 2021-06-15 ENCOUNTER — Other Ambulatory Visit: Payer: Self-pay | Admitting: *Deleted

## 2021-06-15 DIAGNOSIS — M545 Low back pain, unspecified: Secondary | ICD-10-CM | POA: Diagnosis not present

## 2021-06-15 DIAGNOSIS — M6281 Muscle weakness (generalized): Secondary | ICD-10-CM | POA: Diagnosis not present

## 2021-06-15 DIAGNOSIS — Z9181 History of falling: Secondary | ICD-10-CM | POA: Diagnosis not present

## 2021-06-15 DIAGNOSIS — S82851D Displaced trimalleolar fracture of right lower leg, subsequent encounter for closed fracture with routine healing: Secondary | ICD-10-CM | POA: Diagnosis not present

## 2021-06-15 MED ORDER — ALPRAZOLAM 0.25 MG PO TABS: 0.2500 mg | ORAL_TABLET | Freq: Every evening | ORAL | 0 refills | Status: DC | PRN

## 2021-06-15 NOTE — Telephone Encounter (Signed)
[facility] Requested refill °Pended Rx and sent to [facility] for approval.  °

## 2021-06-16 DIAGNOSIS — M545 Low back pain, unspecified: Secondary | ICD-10-CM | POA: Diagnosis not present

## 2021-06-16 DIAGNOSIS — Z9181 History of falling: Secondary | ICD-10-CM | POA: Diagnosis not present

## 2021-06-16 DIAGNOSIS — S82851D Displaced trimalleolar fracture of right lower leg, subsequent encounter for closed fracture with routine healing: Secondary | ICD-10-CM | POA: Diagnosis not present

## 2021-06-16 DIAGNOSIS — M6281 Muscle weakness (generalized): Secondary | ICD-10-CM | POA: Diagnosis not present

## 2021-06-17 DIAGNOSIS — S82851D Displaced trimalleolar fracture of right lower leg, subsequent encounter for closed fracture with routine healing: Secondary | ICD-10-CM | POA: Diagnosis not present

## 2021-06-19 DIAGNOSIS — Z9181 History of falling: Secondary | ICD-10-CM | POA: Diagnosis not present

## 2021-06-19 DIAGNOSIS — S82851D Displaced trimalleolar fracture of right lower leg, subsequent encounter for closed fracture with routine healing: Secondary | ICD-10-CM | POA: Diagnosis not present

## 2021-06-19 DIAGNOSIS — M6281 Muscle weakness (generalized): Secondary | ICD-10-CM | POA: Diagnosis not present

## 2021-06-19 DIAGNOSIS — M545 Low back pain, unspecified: Secondary | ICD-10-CM | POA: Diagnosis not present

## 2021-06-22 DIAGNOSIS — Z9181 History of falling: Secondary | ICD-10-CM | POA: Diagnosis not present

## 2021-06-22 DIAGNOSIS — M6281 Muscle weakness (generalized): Secondary | ICD-10-CM | POA: Diagnosis not present

## 2021-06-22 DIAGNOSIS — S82851D Displaced trimalleolar fracture of right lower leg, subsequent encounter for closed fracture with routine healing: Secondary | ICD-10-CM | POA: Diagnosis not present

## 2021-06-22 DIAGNOSIS — M545 Low back pain, unspecified: Secondary | ICD-10-CM | POA: Diagnosis not present

## 2021-06-23 DIAGNOSIS — M6281 Muscle weakness (generalized): Secondary | ICD-10-CM | POA: Diagnosis not present

## 2021-06-23 DIAGNOSIS — S82851D Displaced trimalleolar fracture of right lower leg, subsequent encounter for closed fracture with routine healing: Secondary | ICD-10-CM | POA: Diagnosis not present

## 2021-06-23 DIAGNOSIS — Z9181 History of falling: Secondary | ICD-10-CM | POA: Diagnosis not present

## 2021-06-23 DIAGNOSIS — M545 Low back pain, unspecified: Secondary | ICD-10-CM | POA: Diagnosis not present

## 2021-07-01 ENCOUNTER — Encounter (HOSPITAL_BASED_OUTPATIENT_CLINIC_OR_DEPARTMENT_OTHER): Payer: Medicare Other | Admitting: Internal Medicine

## 2021-07-06 ENCOUNTER — Encounter: Payer: Self-pay | Admitting: Orthopedic Surgery

## 2021-07-06 ENCOUNTER — Non-Acute Institutional Stay (SKILLED_NURSING_FACILITY): Payer: Medicare Other | Admitting: Orthopedic Surgery

## 2021-07-06 DIAGNOSIS — K219 Gastro-esophageal reflux disease without esophagitis: Secondary | ICD-10-CM

## 2021-07-06 DIAGNOSIS — M545 Low back pain, unspecified: Secondary | ICD-10-CM | POA: Diagnosis not present

## 2021-07-06 DIAGNOSIS — L89626 Pressure-induced deep tissue damage of left heel: Secondary | ICD-10-CM

## 2021-07-06 DIAGNOSIS — R600 Localized edema: Secondary | ICD-10-CM

## 2021-07-06 DIAGNOSIS — K5909 Other constipation: Secondary | ICD-10-CM

## 2021-07-06 DIAGNOSIS — D61818 Other pancytopenia: Secondary | ICD-10-CM | POA: Diagnosis not present

## 2021-07-06 DIAGNOSIS — I10 Essential (primary) hypertension: Secondary | ICD-10-CM | POA: Diagnosis not present

## 2021-07-06 DIAGNOSIS — G8929 Other chronic pain: Secondary | ICD-10-CM

## 2021-07-06 DIAGNOSIS — F418 Other specified anxiety disorders: Secondary | ICD-10-CM

## 2021-07-06 DIAGNOSIS — E039 Hypothyroidism, unspecified: Secondary | ICD-10-CM

## 2021-07-06 NOTE — Progress Notes (Signed)
Location:   [facility] Room Number: 35 A Place of Service:  SNF (31) Provider:  Windell Moulding, NP  Virgie Dad, MD  Patient Care Team: Virgie Dad, MD as PCP - General (Internal Medicine) Mast, Man X, NP as Nurse Practitioner (Internal Medicine)  Extended Emergency Contact Information Primary Emergency Contact: Snelson,[name] Address: [address]          [place], [place] [address] [place] Phone: [phone] Mobile Phone: [phone] Relation: Son Secondary Emergency Contact: Obenchain, Kathi [place] Phone: [phone] Mobile Phone: [phone] Relation: Other  Code Status:  DNR Goals of care: Advanced Directive information Advanced Directives 07/06/2021  Does Patient Have a Medical Advance Directive? Yes  Type of Paramedic of [place];Out of facility DNR (pink MOST or yellow form)  Does patient want to make changes to medical advance directive? No - Patient declined  Copy of [place] in Chart? Yes - validated most recent copy scanned in chart (See row information)  Pre-existing out of facility DNR order (yellow form or pink MOST form) Pink MOST form placed in chart (order not valid for inpatient use);Yellow form placed in chart (order not valid for inpatient use)     Chief Complaint  Patient presents with   Medical Management of Chronic Issues    Routine Visit   Quality Metric Gaps    Pneumonia Vaccine, COVID #4    HPI:  Pt is a 87 y.o. female seen today for medical management of chronic diseases.    She currently resides on the skilled nursing unit at [facility]. Past medical history includes: HTN, PVD, constipation, GERD, hypothyroidism, OA, osteoporosis, CKD3, cystitis, pancytopenia, depression/anxiety, weakness  Left heel pressure injury- slow healing, doing well with Boost protein shake, continues to use Podon boot in bed, dressing changes daily-  cleanse with saline/cover with hydrogel and foam dressing daily Closed trimalleolar right ankle fracture- WBAT with brace, scheduled f/u 12/21 Pancytopenia- seen by Dr. Alen Blew in past, no further biopsies due to age Chronic back pain- pain stimulated with movement more than sitting, stable with scheduled tylenol, gabapentin and oxycodone prn HTN- BUN/creat 27/0.98 04/04/2021, remains on losartan BLE- non pitting, wears teds, elevates legs when she can, remains on torsemide daily Hypothyroidism- TSH 3.82 10/20/2020, remains on levothyroxine GERD- hgb 10.1 10/20/2020, remains on Prilosec Depression with anxiety- no mood changes, remains on Wellbutrin, Zoloft and xanax prn Constipation- LBM 12/12, remains on senna daily and miralax prn  No recent falls, injuries or behavioral outbursts.   Recent blood pressures:  12/06- 138/61  11/29- 126/59  11/22- 143/52  Recent weights:  12/07- 175.4 lbs  11/09- 174.1 lbs  10/04- 171.1 lbs  09/07- 182.1 lbs         Past Medical History:  Diagnosis Date   Anemia    Anxiety    Chronotropic incompetence 12/2012    potentially medication related; noted on CPET    DDD (degenerative disc disease)    Eczema    GERD (gastroesophageal reflux disease)    H/O hiatal hernia    Hx: UTI (urinary tract infection)    Hyperlipidemia    Hypertension    Hypothyroidism    Osteopenia    Septicemia ([place]) 2002   following UTI   Vertigo, benign positional    Past Surgical History:  Procedure Laterality Date   BREAST SURGERY     left biopsy   CATARACT EXTRACTION Right  2 weeks ago   CPET / MET - PFTS     Consistent with chronotropic incompetence; See attached report in the results section   DILATION AND CURETTAGE OF UTERUS     x 2   DOPPLER ECHOCARDIOGRAPHY  01/10/2013   Normal LV size and function. Normal EF. Air sclerosis but no stenosis   ESOPHAGOGASTRODUODENOSCOPY  05/04/2012   Procedure: ESOPHAGOGASTRODUODENOSCOPY (EGD);  Surgeon: Inda Castle, MD;  Location: [facility];  Service: Endoscopy;  Laterality: N/A;   EYE SURGERY     cataract extraction with ILO  ? eye   IR KYPHO EA ADDL LEVEL THORACIC OR LUMBAR  10/28/2016   IR KYPHO LUMBAR INC FX REDUCE BONE BX UNI/BIL CANNULATION INC/IMAGING  10/28/2016   IR KYPHO THORACIC WITH BONE BIOPSY  12/24/2016   IR RADIOLOGIST EVAL & MGMT  11/11/2016   KNEE ARTHROSCOPY  2011   right   ORIF ANKLE FRACTURE Right 03/27/2021   Procedure: OPEN REDUCTION INTERNAL FIXATION (ORIF) ANKLE FRACTURE;  Surgeon: Marchia Bond, MD;  Location: [facility];  Service: Orthopedics;  Laterality: Right;   TONSILLECTOMY     TOTAL KNEE ARTHROPLASTY  04/24/2012   Procedure: TOTAL KNEE ARTHROPLASTY;  Surgeon: Gearlean Alf, MD;  Location: [facility];  Service: Orthopedics;  Laterality: Right;   TRANSTHORACIC ECHOCARDIOGRAM  02/2018   Ordered by PCP for "congestive heart failure " -EF 60 M 65%.  GR 1 DD.  No R WMA--- > ESSENTIALLY NORMAL    No Known Allergies  Allergies as of 07/06/2021   No Known Allergies      Medication List        Accurate as of July 06, 2021  1:36 PM. If you have any questions, ask your nurse or doctor.          acetaminophen 500 MG tablet Commonly known as: TYLENOL Take 1,000 mg by mouth every 8 (eight) hours.   ALPRAZolam 0.25 MG tablet Commonly known as: XANAX Take 1 tablet (0.25 mg total) by mouth at bedtime as needed for sleep or anxiety.   atorvastatin 10 MG tablet Commonly known as: LIPITOR Take 5 mg by mouth at bedtime.   Biofreeze 4 % Gel Generic drug: Menthol (Topical Analgesic) Apply topically 2 (two) times daily as needed. Apply thin layer to left thigh   buPROPion 150 MG 12 hr tablet Commonly known as: WELLBUTRIN SR Take 1 tablet (150 mg total) by mouth 2 (two) times daily after a meal.   calcium citrate-vitamin D 315-200 MG-UNIT tablet Commonly known as: CITRACAL+D Take 1 tablet by mouth 2 (two) times daily.   Camphor-Menthol-Methyl Sal 3.07-31-08 %  Ptch Place 1 patch onto the skin See admin instructions. Apply one patch onto the skin of the  lower back daily for back pain   ferrous sulfate 325 (65 FE) MG EC tablet Take 325 mg by mouth every Monday, Wednesday, and Friday. With a meal   fish oil-omega-3 fatty acids 1000 MG capsule Take 1 g by mouth every morning.   gabapentin 100 MG capsule Commonly known as: NEURONTIN Take 100 mg by mouth 3 (three) times daily.   hydrocortisone cream 1 % Apply 1 application topically daily as needed for itching.   levothyroxine 75 MCG tablet Commonly known as: SYNTHROID Take 75 mcg by mouth every morning.   losartan 25 MG tablet Commonly known as: COZAAR Take 25 mg by mouth every morning.   nystatin powder Commonly known as: MYCOSTATIN/NYSTOP Apply 1 application topically 2 (two) times daily as  needed (yeast).   omeprazole 40 MG capsule Commonly known as: PRILOSEC Take 40 mg by mouth every morning.   OxyCODONE HCl (Abuse Deter) 5 MG Taba Commonly known as: OXAYDO Take 2.5 mg by mouth 2 (two) times daily as needed. Give 30 minutes before transportation to ortho appointments.   polyethylene glycol 17 g packet Commonly known as: MIRALAX / GLYCOLAX Take 17 g by mouth daily as needed (constipation).   polyvinyl alcohol 1.4 % ophthalmic solution Commonly known as: LIQUIFILM TEARS Place 1 drop into both eyes 3 (three) times daily.   potassium chloride SA 20 MEQ tablet Commonly known as: KLOR-CON M Take 20 mEq by mouth 2 (two) times daily.   senna 8.6 MG Tabs tablet Commonly known as: SENOKOT Take 2 tablets by mouth at bedtime.   sertraline 25 MG tablet Commonly known as: ZOLOFT Take 75 mg by mouth every morning.   torsemide 20 MG tablet Commonly known as: DEMADEX Take 20 mg by mouth every morning.   zinc oxide 20 % ointment Apply 1 application topically See admin instructions. Apply topically to per/buttocks after every incontinent episode and as needed for redness         Review of Systems  Constitutional:  Negative for activity change, appetite change, chills, fatigue and fever.  HENT:  Negative for congestion and trouble swallowing.   Eyes:  Negative for visual disturbance.  Respiratory:  Negative for cough, shortness of breath and wheezing.   Cardiovascular:  Positive for leg swelling. Negative for chest pain.  Gastrointestinal:  Positive for constipation. Negative for abdominal distention, abdominal pain, blood in stool, diarrhea, nausea and vomiting.  Genitourinary:  Negative for dysuria, frequency and hematuria.  Musculoskeletal:  Positive for arthralgias, back pain and gait problem.  Skin:  Positive for wound.  Neurological:  Positive for weakness. Negative for dizziness and headaches.  Psychiatric/Behavioral:  Positive for dysphoric mood. Negative for confusion and sleep disturbance. The patient is nervous/anxious.    Immunization History  Administered Date(s) Administered   Influenza, High Dose Seasonal PF 04/26/2016, 03/22/2017, 05/08/2019   Influenza,inj,Quad PF,6+ Mos 04/27/2018   Influenza-Unspecified 04/22/2011, 04/04/2012, 05/03/2013, 05/04/2013, 04/17/2014, 04/19/2015, 04/26/2016, 04/22/2017, 05/20/2021   Moderna SARS-COV2 Booster Vaccination 06/09/2020, 12/31/2020   Moderna Sars-Covid-2 Vaccination 07/30/2019, 08/27/2019   PFIZER(Purple Top)SARS-COV-2 Vaccination 04/15/2021   Pneumococcal Polysaccharide-23 08/26/2003, 09/06/2011, 05/15/2019   Pneumococcal-Unspecified 12/20/2013   Td 09/26/2006   Tdap 05/15/2019   Zoster Recombinat (Shingrix) 11/21/2017, 04/08/2018   Zoster, Live 10/25/2007   Pertinent  Health Maintenance Due  Topic Date Due   INFLUENZA VACCINE  Completed   DEXA SCAN  Completed   Fall Risk 03/30/2021 03/31/2021 03/31/2021 03/31/2021 04/04/2021  Falls in the past year? - - - - -  Was there an injury with Fall? - - - - -  Fall Risk Category Calculator - - - - -  Fall Risk Category - - - - -  Patient Fall Risk Level  _0   Patient at Risk for Falls Due to - - - - -  Fall risk Follow up - - - - -   Functional Status Survey:    Vitals:   07/06/21 1312  BP: 138/61  Pulse: 93  Resp: 18  Temp: 97.7 F (36.5 C)  SpO2: 95%  Weight: 175 lb 6.4 oz (79.6 kg)  Height: _1  (1.626 m)   Body mass index is 30.11 kg/m. Physical Exam Vitals reviewed.  Constitutional:  General: She is not in acute distress. HENT:     Head: Normocephalic.     Right Ear: There is no impacted cerumen.     Left Ear: There is no impacted cerumen.     Nose: Nose normal.     Mouth/Throat:     Mouth: Mucous membranes are moist.  Eyes:     General:        Right eye: No discharge.        Left eye: No discharge.  Neck:     Vascular: No carotid bruit.  Cardiovascular:     Rate and Rhythm: Normal rate and regular rhythm.     Pulses: Normal pulses.     Heart sounds: Normal heart sounds. No murmur heard. Pulmonary:     Effort: Pulmonary effort is normal. No respiratory distress.     Breath sounds: Normal breath sounds. No wheezing.  Abdominal:     General: Bowel sounds are normal. There is no distension.     Palpations: Abdomen is soft.     Tenderness: There is no abdominal tenderness.  Musculoskeletal:     Cervical back: Normal range of motion.     Right lower leg: Edema present.     Left lower leg: Edema present.     Comments: Non pitting  Lymphadenopathy:     Cervical: No cervical adenopathy.  Skin:    General: Skin is warm and dry.     Capillary Refill: Capillary refill takes less than 2 seconds.     Comments: Bruising to left upper arm and left calf, yellow/purple discoloration, no skin breakdown or tenderness. Left heel injury CDI, granulation tissue present, no drainage, surrounding skin intact.   Neurological:     General: No focal deficit present.     Mental Status: She is alert and oriented to person, place, and time.     Motor:  Weakness present.     Gait: Gait abnormal.     Comments: wheelchair  Psychiatric:        Mood and Affect: Mood normal.        Behavior: Behavior normal.    Labs reviewed: Recent Labs    03/28/21 0547 03/30/21 0503 04/04/21 1827  NA 137 137 145  K 4.0 3.5 4.5  CL 104 100 103  CO2 _0 GLUCOSE 117* 101* 104*  BUN 22 36* 27*  CREATININE 1.00 1.31* 0.98  CALCIUM 9.2 8.9 9.7   Recent Labs    10/20/20 0000 03/27/21 0543 04/04/21 1827  AST 17 21 37  ALT _1 ALKPHOS 97 69 95  BILITOT  --  0.4 0.5  PROT  --  7.4 7.9  ALBUMIN 3.9 3.7 3.2*   Recent Labs    03/26/21 0334 03/27/21 0543 03/30/21 0503 03/31/21 0444 04/03/21 0000 04/04/21 1827  WBC 11.0*   < > 10.7* 9.3 15.0 10.3  NEUTROABS 8.6*  --   --   --  12,630.00 8.0*  HGB 10.0*   < > 7.6* 8.1* 8.2* 8.2*  HCT 33.7*   < > 24.6* 26.1* 26* 27.8*  MCV 91.6   < > 83.4 82.9  --  85.3  PLT 46*   < > 83* 97* 137* 226   < > = values in this interval not displayed.   Lab Results  Component Value Date   TSH 3.82 10/20/2020   No results found for: HGBA1C Lab Results  Component Value Date   CHOL 165 10/20/2020   HDL  44 10/20/2020   LDLCALC 94 10/20/2020   TRIG 168 (A) 10/20/2020   CHOLHDL 3.8 11/23/2018    Significant Diagnostic Results in last 30 days:  No results found.  Assessment/Plan 1. Pressure injury of deep tissue of left heel - slow healing, granulation tissue present - cont to elevate heels, use Podon boot - cont Boost protein supplement - cont daily dressing changes  2. Pancytopenia ([place]) - seen by Dr. Alen Blew in past- no further biopsies due to age  90. Chronic bilateral low back pain without sciatica - stable with scheduled tylenol, gabapentin and oxycodone prn  4. Essential hypertension - controlled - cont losartan  5. Bilateral leg edema - non pitting - cont torsemide  - encourage elevation   6. Acquired hypothyroidism - TSH stable -cont levothyroxine  7. Gastroesophageal  reflux disease, unspecified whether esophagitis present - cont Prilosec  8. Depression with anxiety - no mood changes or panic attacks - cont Wellbutrin, zoloft and xanax prn  9. Chronic constipation - LBM 12/12, abdomen soft - cont senna daily and miralax prn    Family/ staff Communication: plan discussed with patient and nurse  Labs/tests ordered:  none

## 2021-07-10 LAB — CBC AND DIFFERENTIAL
HCT: 29 — AB (ref 36–46)
Hemoglobin: 9.4 — AB (ref 12.0–16.0)
Neutrophils Absolute: 3671
Platelets: 95 — AB (ref 150–399)
WBC: 5.7

## 2021-07-10 LAB — HEPATIC FUNCTION PANEL
ALT: 19 (ref 7–35)
AST: 22 (ref 13–35)
Alkaline Phosphatase: 91 (ref 25–125)
Bilirubin, Total: 0.3

## 2021-07-10 LAB — CBC: RBC: 3.39 — AB (ref 3.87–5.11)

## 2021-07-10 LAB — COMPREHENSIVE METABOLIC PANEL
Albumin: 3.8 (ref 3.5–5.0)
Calcium: 9.6 (ref 8.7–10.7)
Globulin: 2.8

## 2021-07-10 LAB — BASIC METABOLIC PANEL
BUN: 21 (ref 4–21)
CO2: 27 — AB (ref 13–22)
Chloride: 103 (ref 99–108)
Creatinine: 1.1 (ref 0.5–1.1)
Glucose: 80
Potassium: 4.3 (ref 3.4–5.3)
Sodium: 138 (ref 137–147)

## 2021-07-31 ENCOUNTER — Non-Acute Institutional Stay (SKILLED_NURSING_FACILITY): Payer: Medicare Other | Admitting: Orthopedic Surgery

## 2021-07-31 ENCOUNTER — Encounter: Payer: Self-pay | Admitting: Orthopedic Surgery

## 2021-07-31 DIAGNOSIS — S82851D Displaced trimalleolar fracture of right lower leg, subsequent encounter for closed fracture with routine healing: Secondary | ICD-10-CM

## 2021-07-31 DIAGNOSIS — D61818 Other pancytopenia: Secondary | ICD-10-CM | POA: Diagnosis not present

## 2021-07-31 DIAGNOSIS — R600 Localized edema: Secondary | ICD-10-CM | POA: Diagnosis not present

## 2021-07-31 DIAGNOSIS — G8929 Other chronic pain: Secondary | ICD-10-CM

## 2021-07-31 DIAGNOSIS — E039 Hypothyroidism, unspecified: Secondary | ICD-10-CM | POA: Diagnosis not present

## 2021-07-31 DIAGNOSIS — M545 Low back pain, unspecified: Secondary | ICD-10-CM

## 2021-07-31 DIAGNOSIS — K219 Gastro-esophageal reflux disease without esophagitis: Secondary | ICD-10-CM

## 2021-07-31 DIAGNOSIS — I10 Essential (primary) hypertension: Secondary | ICD-10-CM

## 2021-07-31 DIAGNOSIS — L03032 Cellulitis of left toe: Secondary | ICD-10-CM

## 2021-07-31 DIAGNOSIS — F418 Other specified anxiety disorders: Secondary | ICD-10-CM

## 2021-07-31 DIAGNOSIS — K5909 Other constipation: Secondary | ICD-10-CM

## 2021-07-31 NOTE — Progress Notes (Signed)
Location:   [facility] Room Number: 35 Place of Service:  SNF ([phone]) Provider:  Yvonna Alanis, NP   Virgie Dad, MD  Patient Care Team: Virgie Dad, MD as PCP - General (Internal Medicine) Mast, Man X, NP as Nurse Practitioner (Internal Medicine)  Extended Emergency Contact Information Primary Emergency Contact: Butters,[name] Address: [address]          [place], [place] [address] [place] Phone: [phone] Mobile Phone: [phone] Relation: Son Secondary Emergency Contact: Vazquez, Kathi [place] Phone: [phone] Mobile Phone: [phone] Relation: Other  Code Status: DNR Managed Care Goals of care: Advanced Directive information Advanced Directives 07/31/2021  Does Patient Have a Medical Advance Directive? Yes  Type of Paramedic of [place];Out of facility DNR (pink MOST or yellow form)  Does patient want to make changes to medical advance directive? No - Patient declined  Copy of [place] in Chart? Yes - validated most recent copy scanned in chart (See row information)  Pre-existing out of facility DNR order (yellow form or pink MOST form) Pink MOST form placed in chart (order not valid for inpatient use);Yellow form placed in chart (order not valid for inpatient use)     Chief Complaint  Patient presents with   Acute Visit    left great toe pain    HPI:  Pt is a 88 y.o. female seen today for an acute visit for left great tow pain.   She reports increased pain and redness to left great toe within the past day. Pain intermittent, rated 4/10, tender to touch. Currently being treated for left heel pressure injury. She continues to use Podon boot. Dressing changes daily with foam dressing.   Closed trimalleolar right ankle fracture- WBAT, brace discontinued, advanced to normal shoe with ankle brace, continues with work with PT/OT Pancytopenia- seen by Dr. Alen Blew  in past, no further biopsies due to age Chronic back pain- pain stimulated with movement more than sitting, stable with scheduled tylenol, gabapentin and oxycodone prn HTN- BUN/creat 21/1.1 07/10/2021, remains on losartan, NAS diet BLE- non pitting, wears teds, elevates legs when she can, remains on torsemide daily Hypothyroidism- TSH 3.82 10/20/2020, remains on levothyroxine GERD- hgb 9.4 07/10/2021, remains on Prilosec Depression with anxiety- no mood changes, remains on Wellbutrin, Zoloft and xanax prn Constipation- LBM 01/05, remains on senna daily and miralax prn    Past Medical History:  Diagnosis Date   Anemia    Anxiety    Chronotropic incompetence 12/2012    potentially medication related; noted on CPET    DDD (degenerative disc disease)    Eczema    GERD (gastroesophageal reflux disease)    H/O hiatal hernia    Hx: UTI (urinary tract infection)    Hyperlipidemia    Hypertension    Hypothyroidism    Osteopenia    Septicemia ([place]) 2002   following UTI   Vertigo, benign positional    Past Surgical History:  Procedure Laterality Date   BREAST SURGERY     left biopsy   CATARACT EXTRACTION Right    2 weeks ago   CPET / MET - PFTS     Consistent with chronotropic incompetence; See attached report in the results section   DILATION AND CURETTAGE OF UTERUS     x 2   DOPPLER ECHOCARDIOGRAPHY  01/10/2013   Normal LV size and function. Normal EF. Air sclerosis but no stenosis  ESOPHAGOGASTRODUODENOSCOPY  05/04/2012   Procedure: ESOPHAGOGASTRODUODENOSCOPY (EGD);  Surgeon: Inda Castle, MD;  Location: [facility];  Service: Endoscopy;  Laterality: N/A;   EYE SURGERY     cataract extraction with ILO  ? eye   IR KYPHO EA ADDL LEVEL THORACIC OR LUMBAR  10/28/2016   IR KYPHO LUMBAR INC FX REDUCE BONE BX UNI/BIL CANNULATION INC/IMAGING  10/28/2016   IR KYPHO THORACIC WITH BONE BIOPSY  12/24/2016   IR RADIOLOGIST EVAL & MGMT  11/11/2016   KNEE ARTHROSCOPY  2011   right   ORIF  ANKLE FRACTURE Right 03/27/2021   Procedure: OPEN REDUCTION INTERNAL FIXATION (ORIF) ANKLE FRACTURE;  Surgeon: Marchia Bond, MD;  Location: [facility];  Service: Orthopedics;  Laterality: Right;   TONSILLECTOMY     TOTAL KNEE ARTHROPLASTY  04/24/2012   Procedure: TOTAL KNEE ARTHROPLASTY;  Surgeon: Gearlean Alf, MD;  Location: [facility];  Service: Orthopedics;  Laterality: Right;   TRANSTHORACIC ECHOCARDIOGRAM  02/2018   Ordered by PCP for "congestive heart failure " -EF 60 M 65%.  GR 1 DD.  No R WMA--- > ESSENTIALLY NORMAL    No Known Allergies  Allergies as of 07/31/2021   No Known Allergies      Medication List        Accurate as of July 31, 2021  3:34 PM. If you have any questions, ask your nurse or doctor.          acetaminophen 500 MG tablet Commonly known as: TYLENOL Take 1,000 mg by mouth every 8 (eight) hours.   ALPRAZolam 0.25 MG tablet Commonly known as: XANAX Take 1 tablet (0.25 mg total) by mouth at bedtime as needed for sleep or anxiety.   atorvastatin 10 MG tablet Commonly known as: LIPITOR Take 5 mg by mouth at bedtime.   Biofreeze 4 % Gel Generic drug: Menthol (Topical Analgesic) Apply topically 2 (two) times daily as needed. Apply thin layer to left thigh   buPROPion 150 MG 12 hr tablet Commonly known as: WELLBUTRIN SR Take 1 tablet (150 mg total) by mouth 2 (two) times daily after a meal.   calcium citrate-vitamin D 315-200 MG-UNIT tablet Commonly known as: CITRACAL+D Take 1 tablet by mouth 2 (two) times daily.   Camphor-Menthol-Methyl Sal 3.07-31-08 % Ptch Place 1 patch onto the skin See admin instructions. Apply one patch onto the skin of the  lower back daily for back pain   ferrous sulfate 325 (65 FE) MG EC tablet Take 325 mg by mouth every Monday, Wednesday, and Friday. With a meal   fish oil-omega-3 fatty acids 1000 MG capsule Take 1 g by mouth every morning.   gabapentin 100 MG capsule Commonly known as: NEURONTIN Take 100 mg by mouth 3  (three) times daily.   hydrocortisone cream 1 % Apply 1 application topically daily as needed for itching.   levothyroxine 75 MCG tablet Commonly known as: SYNTHROID Take 75 mcg by mouth every morning.   losartan 25 MG tablet Commonly known as: COZAAR Take 25 mg by mouth every morning.   nystatin powder Commonly known as: MYCOSTATIN/NYSTOP Apply 1 application topically 2 (two) times daily as needed (yeast).   omeprazole 40 MG capsule Commonly known as: PRILOSEC Take 40 mg by mouth every morning.   OxyCODONE HCl (Abuse Deter) 5 MG Taba Commonly known as: OXAYDO Take 2.5 mg by mouth 2 (two) times daily as needed. Give 30 minutes before transportation to ortho appointments.   polyethylene glycol 17 g packet Commonly known  as: MIRALAX / GLYCOLAX Take 17 g by mouth daily as needed (constipation).   polyvinyl alcohol 1.4 % ophthalmic solution Commonly known as: LIQUIFILM TEARS Place 1 drop into both eyes 3 (three) times daily.   potassium chloride SA 20 MEQ tablet Commonly known as: KLOR-CON M Take 20 mEq by mouth 2 (two) times daily.   senna 8.6 MG Tabs tablet Commonly known as: SENOKOT Take 2 tablets by mouth at bedtime.   sertraline 25 MG tablet Commonly known as: ZOLOFT Take 75 mg by mouth every morning.   torsemide 20 MG tablet Commonly known as: DEMADEX Take 20 mg by mouth every morning.   zinc oxide 20 % ointment Apply 1 application topically See admin instructions. Apply topically to per/buttocks after every incontinent episode and as needed for redness        Review of Systems  Constitutional:  Negative for activity change, appetite change, chills, fatigue and fever.  HENT:  Negative for congestion and trouble swallowing.   Eyes:  Negative for visual disturbance.  Respiratory:  Negative for cough, shortness of breath and wheezing.   Cardiovascular:  Negative for chest pain and leg swelling.  Gastrointestinal:  Positive for constipation. Negative for  abdominal distention, abdominal pain, diarrhea and nausea.  Genitourinary:  Negative for dysuria, frequency and hematuria.  Musculoskeletal:  Positive for arthralgias and gait problem.  Skin:  Positive for wound.  Neurological:  Positive for weakness. Negative for dizziness and headaches.  Psychiatric/Behavioral:  Positive for dysphoric mood. Negative for confusion and sleep disturbance. The patient is nervous/anxious.    Immunization History  Administered Date(s) Administered   Influenza, High Dose Seasonal PF 04/26/2016, 03/22/2017, 05/08/2019   Influenza,inj,Quad PF,6+ Mos 04/27/2018   Influenza-Unspecified 04/22/2011, 04/04/2012, 05/03/2013, 05/04/2013, 04/17/2014, 04/19/2015, 04/26/2016, 04/22/2017, 05/20/2021   Moderna SARS-COV2 Booster Vaccination 06/09/2020, 12/31/2020   Moderna Sars-Covid-2 Vaccination 07/30/2019, 08/27/2019   PFIZER(Purple Top)SARS-COV-2 Vaccination 04/15/2021   Pneumococcal Polysaccharide-23 08/26/2003, 09/06/2011, 05/15/2019   Pneumococcal-Unspecified 12/20/2013   Td 09/26/2006   Tdap 05/15/2019   Zoster Recombinat (Shingrix) 11/21/2017, 04/08/2018   Zoster, Live 10/25/2007   Pertinent  Health Maintenance Due  Topic Date Due   INFLUENZA VACCINE  Completed   DEXA SCAN  Completed   Fall Risk 03/30/2021 03/31/2021 03/31/2021 03/31/2021 04/04/2021  Falls in the past year? - - - - -  Was there an injury with Fall? - - - - -  Fall Risk Category Calculator - - - - -  Fall Risk Category - - - - -  Patient Fall Risk Level High fall risk High fall risk High fall risk High fall risk High fall risk  Patient at Risk for Falls Due to - - - - -  Fall risk Follow up - - - - -   Functional Status Survey:    Vitals:   07/31/21 1523  BP: 122/78  Pulse: 69  Resp: 18  Temp: 97.9 F (36.6 C)  SpO2: 92%  Weight: 170 lb 8 oz (77.3 kg)  Height: '5\' 4"'  (1.626 m)   Body mass index is 29.27 kg/m. Physical Exam Vitals reviewed.  Constitutional:      General: She is not in  acute distress. HENT:     Head: Normocephalic.  Eyes:     General:        Right eye: No discharge.        Left eye: No discharge.  Neck:     Vascular: No carotid bruit.  Cardiovascular:     Rate and Rhythm:  Normal rate and regular rhythm.     Pulses: Normal pulses.     Heart sounds: Normal heart sounds. No murmur heard. Pulmonary:     Effort: Pulmonary effort is normal. No respiratory distress.     Breath sounds: Normal breath sounds. No wheezing.  Abdominal:     General: Bowel sounds are normal. There is no distension.     Palpations: Abdomen is soft.     Tenderness: There is no abdominal tenderness.  Musculoskeletal:     Cervical back: Normal range of motion.     Right lower leg: Edema present.     Left lower leg: Edema present.     Comments: Non pitting  Lymphadenopathy:     Cervical: No cervical adenopathy.  Skin:    General: Skin is warm and dry.     Capillary Refill: Capillary refill takes less than 2 seconds.     Findings: Lesion present.     Comments: Left great toe with erythema, slight swelling, tenderness and some warmth, no skin breakdown. Left heel with stage 1 ulcer, granulation tissue present, CDI, no drainage.   Neurological:     General: No focal deficit present.     Mental Status: She is alert and oriented to person, place, and time.     Motor: Weakness present.     Gait: Gait abnormal.  Psychiatric:        Mood and Affect: Mood normal.        Behavior: Behavior normal.    Labs reviewed: Recent Labs    03/28/21 0547 03/30/21 0503 04/04/21 1827 07/10/21 0000  NA 137 137 145 138  K 4.0 3.5 4.5 4.3  CL 104 100 103 103  CO2 '28 31 31 ' 27*  GLUCOSE 117* 101* 104*  --   BUN 22 36* 27* 21  CREATININE 1.00 1.31* 0.98 1.1  CALCIUM 9.2 8.9 9.7 9.6   Recent Labs    03/27/21 0543 04/04/21 1827 07/10/21 0000  AST 21 37 22  ALT '16 24 19  ' ALKPHOS 69 95 91  BILITOT 0.4 0.5  --   PROT 7.4 7.9  --   ALBUMIN 3.7 3.2* 3.8   Recent Labs     03/30/21 0503 03/31/21 0444 04/03/21 0000 04/04/21 1827 07/10/21 0000  WBC 10.7* 9.3 15.0 10.3 5.7  NEUTROABS  --   --  12,630.00 8.0* 3,671.00  HGB 7.6* 8.1* 8.2* 8.2* 9.4*  HCT 24.6* 26.1* 26* 27.8* 29*  MCV 83.4 82.9  --  85.3  --   PLT 83* 97* 137* 226 95*   Lab Results  Component Value Date   TSH 3.82 10/20/2020   No results found for: HGBA1C Lab Results  Component Value Date   CHOL 165 10/20/2020   HDL 44 10/20/2020   LDLCALC 94 10/20/2020   TRIG 168 (A) 10/20/2020   CHOLHDL 3.8 11/23/2018    Significant Diagnostic Results in last 30 days:  No results found.  Assessment/Plan 1. Cellulitis of great toe, left - left great toe with erythema, tenderness, swelling and warmth - afebrile - start doxycycline 100 mg po bid x 7 days  2. Closed trimalleolar fracture of right ankle with routine healing, subsequent encounter - brace discontinued, shoe with ankle brace started - remains WBAT  3. Pancytopenia ([place]) - followed by Dr. Manuella Ghazi- no further workup at this time  4. Chronic bilateral low back pain without sciatica - ongoing - cont scheduled tylenol, gabapentin and oxycodone prn  5. Essential hypertension - controlled -  cont losartan  6. Bilateral leg edema - non pitting - cont torsemide  7. Acquired hypothyroidism - TSH stable - cont levothyroxine  8. Gastroesophageal reflux disease, unspecified whether esophagitis present - cont Prilosec  9. Depression with anxiety - no mood changes - cont Wellbutrin, Zoloft and xanax prn  10. Chronic constipation - LBM 01/05, abdomen soft - cont senna and miralax prn    Family/ staff Communication: plan discussed with patient and nurse  Labs/tests ordered:  none

## 2021-08-07 DIAGNOSIS — D638 Anemia in other chronic diseases classified elsewhere: Secondary | ICD-10-CM | POA: Diagnosis not present

## 2021-08-07 DIAGNOSIS — M179 Osteoarthritis of knee, unspecified: Secondary | ICD-10-CM | POA: Diagnosis not present

## 2021-08-07 LAB — CBC AND DIFFERENTIAL
HCT: 29 — AB (ref 36–46)
Hemoglobin: 9.1 — AB (ref 12.0–16.0)
Neutrophils Absolute: 6771
Platelets: 131 — AB (ref 150–399)
WBC: 9.2

## 2021-08-07 LAB — CBC: RBC: 3.32 — AB (ref 3.87–5.11)

## 2021-08-10 ENCOUNTER — Non-Acute Institutional Stay (SKILLED_NURSING_FACILITY): Payer: Medicare Other | Admitting: Orthopedic Surgery

## 2021-08-10 ENCOUNTER — Encounter: Payer: Self-pay | Admitting: Orthopedic Surgery

## 2021-08-10 DIAGNOSIS — M545 Low back pain, unspecified: Secondary | ICD-10-CM

## 2021-08-10 DIAGNOSIS — I10 Essential (primary) hypertension: Secondary | ICD-10-CM | POA: Diagnosis not present

## 2021-08-10 DIAGNOSIS — K5909 Other constipation: Secondary | ICD-10-CM

## 2021-08-10 DIAGNOSIS — S82851D Displaced trimalleolar fracture of right lower leg, subsequent encounter for closed fracture with routine healing: Secondary | ICD-10-CM

## 2021-08-10 DIAGNOSIS — L89626 Pressure-induced deep tissue damage of left heel: Secondary | ICD-10-CM

## 2021-08-10 DIAGNOSIS — G8929 Other chronic pain: Secondary | ICD-10-CM

## 2021-08-10 DIAGNOSIS — E039 Hypothyroidism, unspecified: Secondary | ICD-10-CM | POA: Diagnosis not present

## 2021-08-10 DIAGNOSIS — K219 Gastro-esophageal reflux disease without esophagitis: Secondary | ICD-10-CM | POA: Diagnosis not present

## 2021-08-10 DIAGNOSIS — D61818 Other pancytopenia: Secondary | ICD-10-CM

## 2021-08-10 DIAGNOSIS — F418 Other specified anxiety disorders: Secondary | ICD-10-CM

## 2021-08-10 DIAGNOSIS — R600 Localized edema: Secondary | ICD-10-CM

## 2021-08-10 DIAGNOSIS — M109 Gout, unspecified: Secondary | ICD-10-CM

## 2021-08-10 NOTE — Progress Notes (Signed)
Location:   [facility] Room Number: 35-A Place of Service:  SNF (31) Provider:  Windell Moulding, NP    Patient Care Team: Virgie Dad, MD as PCP - General (Internal Medicine) Mast, Man X, NP as Nurse Practitioner (Internal Medicine)  Extended Emergency Contact Information Primary Emergency Contact: Tesmer,[name] Address: [address]          [place], [place] [address] [place] Phone: [phone] Mobile Phone: [phone] Relation: Son Secondary Emergency Contact: [name] [place] Phone: [phone] Mobile Phone: [phone] Relation: Other  Code Status:  DNR Goals of care: Advanced Directive information Advanced Directives 08/10/2021  Does Patient Have a Medical Advance Directive? Yes  Type of Paramedic of [place];Out of facility DNR (pink MOST or yellow form)  Does patient want to make changes to medical advance directive? No - Patient declined  Copy of [place] in Chart? Yes - validated most recent copy scanned in chart (See row information)  Pre-existing out of facility DNR order (yellow form or pink MOST form) -     Chief Complaint  Patient presents with   Acute Visit    Left Toe Pain    HPI:  Pt is a 88 y.o. female seen today for an acute visit for ongoing left toe pain.   01/06 she was seen for left great toe/foot pain. She was started on doxycycline x 7 days. 01/13 redness to left toe decreased, ulcer to left heel scabbed. She continued to complain of intermittent left toe pain, cbc/diff and uric acid level ordered. WBC 9.2, uric acid level 8.2 08/07/2020. Today she does not report any pain to her left great toe. We discussed treatment options for gout. At this time she would like to refrain from taking allopurinol. She is open to starting medication if another flare occurs.   Closed trimalleolar right ankle fracture- WBAT, brace discontinued, advanced to  normal shoe with ankle brace, continues with work with PT/OT Pancytopenia- Hgb 9.1, Hct 28.9, platelets 131 08/07/2020, seen by Dr. Alen Blew in past, no further biopsies due to age Chronic back pain- pain stimulated with movement more than sitting, stable with scheduled tylenol, gabapentin and oxycodone prn HTN- BUN/creat 21/1.1 07/10/2021, remains on losartan, NAS diet BLE- non pitting, wears teds, elevates legs when she can, remains on torsemide daily Hypothyroidism- TSH 3.82 10/20/2020, remains on levothyroxine GERD- hgb 9.1 08/07/2020, remains on Prilosec Depression with anxiety- no mood changes, remains on Wellbutrin, Zoloft and xanax prn Constipation- LBM 01/14, remains on senna daily and miralax prn   Past Medical History:  Diagnosis Date   Anemia    Anxiety    Chronotropic incompetence 12/2012    potentially medication related; noted on CPET    DDD (degenerative disc disease)    Eczema    GERD (gastroesophageal reflux disease)    H/O hiatal hernia    Hx: UTI (urinary tract infection)    Hyperlipidemia    Hypertension    Hypothyroidism    Osteopenia    Septicemia ([facility]) 2002   following UTI   Vertigo, benign positional    Past Surgical History:  Procedure Laterality Date   BREAST SURGERY     left biopsy   CATARACT EXTRACTION Right    2 weeks ago   CPET / MET - PFTS     Consistent with chronotropic incompetence; See attached report in the results section   DILATION AND CURETTAGE OF UTERUS  x 2   DOPPLER ECHOCARDIOGRAPHY  01/10/2013   Normal LV size and function. Normal EF. Air sclerosis but no stenosis   ESOPHAGOGASTRODUODENOSCOPY  05/04/2012   Procedure: ESOPHAGOGASTRODUODENOSCOPY (EGD);  Surgeon: Inda Castle, MD;  Location: [facility];  Service: Endoscopy;  Laterality: N/A;   EYE SURGERY     cataract extraction with ILO  ? eye   IR KYPHO EA ADDL LEVEL THORACIC OR LUMBAR  10/28/2016   IR KYPHO LUMBAR INC FX REDUCE BONE BX UNI/BIL CANNULATION INC/IMAGING   10/28/2016   IR KYPHO THORACIC WITH BONE BIOPSY  12/24/2016   IR RADIOLOGIST EVAL & MGMT  11/11/2016   KNEE ARTHROSCOPY  2011   right   ORIF ANKLE FRACTURE Right 03/27/2021   Procedure: OPEN REDUCTION INTERNAL FIXATION (ORIF) ANKLE FRACTURE;  Surgeon: Marchia Bond, MD;  Location: [facility];  Service: Orthopedics;  Laterality: Right;   TONSILLECTOMY     TOTAL KNEE ARTHROPLASTY  04/24/2012   Procedure: TOTAL KNEE ARTHROPLASTY;  Surgeon: Gearlean Alf, MD;  Location: [facility];  Service: Orthopedics;  Laterality: Right;   TRANSTHORACIC ECHOCARDIOGRAM  02/2018   Ordered by PCP for "congestive heart failure " -EF 60 M 65%.  GR 1 DD.  No R WMA--- > ESSENTIALLY NORMAL    No Known Allergies  Allergies as of 08/10/2021   No Known Allergies      Medication List        Accurate as of August 10, 2021 10:22 AM. If you have any questions, ask your nurse or doctor.          acetaminophen 500 MG tablet Commonly known as: TYLENOL Take 1,000 mg by mouth every 8 (eight) hours.   ALPRAZolam 0.25 MG tablet Commonly known as: XANAX Take 0.25 mg by mouth daily. What changed: Another medication with the same name was removed. Continue taking this medication, and follow the directions you see here. Changed by: Yvonna Alanis, NP   atorvastatin 10 MG tablet Commonly known as: LIPITOR Take 5 mg by mouth at bedtime.   Biofreeze 4 % Gel Generic drug: Menthol (Topical Analgesic) Apply topically 2 (two) times daily as needed. Apply thin layer to left thigh   buPROPion 150 MG 12 hr tablet Commonly known as: WELLBUTRIN SR Take 1 tablet (150 mg total) by mouth 2 (two) times daily after a meal.   calcium citrate-vitamin D 315-200 MG-UNIT tablet Commonly known as: CITRACAL+D Take 1 tablet by mouth 2 (two) times daily.   Camphor-Menthol-Methyl Sal 3.07-31-08 % Ptch Place 1 patch onto the skin See admin instructions. Apply one patch onto the skin of the  lower back daily for back pain   ferrous sulfate 325  (65 FE) MG EC tablet Take 325 mg by mouth every Monday, Wednesday, and Friday. With a meal   fish oil-omega-3 fatty acids 1000 MG capsule Take 1 g by mouth every morning.   gabapentin 100 MG capsule Commonly known as: NEURONTIN Take 100 mg by mouth 3 (three) times daily.   hydrocortisone cream 1 % Apply 1 application topically daily as needed for itching.   levothyroxine 75 MCG tablet Commonly known as: SYNTHROID Take 75 mcg by mouth every morning.   losartan 25 MG tablet Commonly known as: COZAAR Take 25 mg by mouth every morning.   nystatin powder Commonly known as: MYCOSTATIN/NYSTOP Apply 1 application topically 2 (two) times daily as needed (yeast).   omeprazole 40 MG capsule Commonly known as: PRILOSEC Take 40 mg by mouth every morning.  OxyCODONE HCl (Abuse Deter) 5 MG Taba Commonly known as: OXAYDO Take 2.5 mg by mouth 2 (two) times daily as needed. Give 30 minutes before transportation to ortho appointments.   polyethylene glycol 17 g packet Commonly known as: MIRALAX / GLYCOLAX Take 17 g by mouth daily as needed (constipation).   polyvinyl alcohol 1.4 % ophthalmic solution Commonly known as: LIQUIFILM TEARS Place 1 drop into both eyes 3 (three) times daily.   potassium chloride SA 20 MEQ tablet Commonly known as: KLOR-CON M Take 20 mEq by mouth 2 (two) times daily.   senna 8.6 MG Tabs tablet Commonly known as: SENOKOT Take 2 tablets by mouth at bedtime.   sertraline 25 MG tablet Commonly known as: ZOLOFT Take 75 mg by mouth every morning.   torsemide 20 MG tablet Commonly known as: DEMADEX Take 20 mg by mouth every morning.   zinc oxide 20 % ointment Apply 1 application topically See admin instructions. Apply topically to per/buttocks after every incontinent episode and as needed for redness        Review of Systems  Constitutional:  Negative for activity change, appetite change, chills, fatigue and fever.  HENT:  Negative for congestion and  trouble swallowing.   Eyes:  Negative for visual disturbance.  Respiratory:  Negative for cough, shortness of breath and wheezing.   Cardiovascular:  Positive for leg swelling. Negative for chest pain.  Gastrointestinal:  Positive for constipation. Negative for abdominal distention, abdominal pain, diarrhea and nausea.  Genitourinary:  Negative for dysuria, frequency and hematuria.  Musculoskeletal:  Positive for arthralgias, back pain and gait problem.  Skin:  Positive for wound.  Neurological:  Positive for weakness. Negative for dizziness and headaches.  Psychiatric/Behavioral:  Positive for dysphoric mood. Negative for sleep disturbance. The patient is nervous/anxious.    Immunization History  Administered Date(s) Administered   Influenza, High Dose Seasonal PF 04/26/2016, 03/22/2017, 05/08/2019   Influenza,inj,Quad PF,6+ Mos 04/27/2018   Influenza-Unspecified 04/22/2011, 04/04/2012, 05/03/2013, 05/04/2013, 04/17/2014, 04/19/2015, 04/26/2016, 04/22/2017, 05/20/2021   Moderna SARS-COV2 Booster Vaccination 06/09/2020, 12/31/2020   Moderna Sars-Covid-2 Vaccination 07/30/2019, 08/27/2019   PFIZER(Purple Top)SARS-COV-2 Vaccination 04/15/2021   Pneumococcal Polysaccharide-23 08/26/2003, 09/06/2011, 05/15/2019   Pneumococcal-Unspecified 12/20/2013   Td 09/26/2006   Tdap 05/15/2019   Zoster Recombinat (Shingrix) 11/21/2017, 04/08/2018   Zoster, Live 10/25/2007   Pertinent  Health Maintenance Due  Topic Date Due   INFLUENZA VACCINE  Completed   DEXA SCAN  Completed   Fall Risk 03/30/2021 03/31/2021 03/31/2021 03/31/2021 04/04/2021  Falls in the past year? - - - - -  Was there an injury with Fall? - - - - -  Fall Risk Category Calculator - - - - -  Fall Risk Category - - - - -  Patient Fall Risk Level High fall risk High fall risk High fall risk High fall risk High fall risk  Patient at Risk for Falls Due to - - - - -  Fall risk Follow up - - - - -   Functional Status Survey:    Vitals:    08/10/21 1013  BP: 106/66  Pulse: 72  Resp: 18  Temp: (!) 97 F (36.1 C)  SpO2: 94%  Weight: 170 lb 8 oz (77.3 kg)  Height: '5\' 4"'  (1.626 m)   Body mass index is 29.27 kg/m. Physical Exam Vitals reviewed.  Constitutional:      General: She is not in acute distress. Eyes:     General:        Right  eye: No discharge.        Left eye: No discharge.  Cardiovascular:     Rate and Rhythm: Normal rate and regular rhythm.     Pulses: Normal pulses.     Heart sounds: Normal heart sounds. No murmur heard. Pulmonary:     Effort: Pulmonary effort is normal. No respiratory distress.     Breath sounds: Normal breath sounds. No wheezing.  Abdominal:     General: Bowel sounds are normal. There is no distension.     Palpations: Abdomen is soft.     Tenderness: There is no abdominal tenderness.  Musculoskeletal:     Right lower leg: Edema present.     Left lower leg: Edema present.     Comments: Non-pitting  Skin:    General: Skin is warm and dry.     Findings: Lesion present.     Comments: Left toe with mild redness near toenail, skin appears less cracked, no warmth. No redness to left dorsal foot. Left heel ulcer scabbed, no drainage, surrounding skin intact.   Neurological:     General: No focal deficit present.     Mental Status: She is alert and oriented to person, place, and time.     Motor: Weakness present.     Gait: Gait abnormal.  Psychiatric:        Mood and Affect: Mood normal.        Behavior: Behavior normal.    Labs reviewed: Recent Labs    03/28/21 0547 03/30/21 0503 04/04/21 1827 07/10/21 0000  NA 137 137 145 138  K 4.0 3.5 4.5 4.3  CL 104 100 103 103  CO2 '28 31 31 ' 27*  GLUCOSE 117* 101* 104*  --   BUN 22 36* 27* 21  CREATININE 1.00 1.31* 0.98 1.1  CALCIUM 9.2 8.9 9.7 9.6   Recent Labs    03/27/21 0543 04/04/21 1827 07/10/21 0000  AST 21 37 22  ALT '16 24 19  ' ALKPHOS 69 95 91  BILITOT 0.4 0.5  --   PROT 7.4 7.9  --   ALBUMIN 3.7 3.2* 3.8    Recent Labs    03/30/21 0503 03/31/21 0444 04/03/21 0000 04/04/21 1827 07/10/21 0000  WBC 10.7* 9.3 15.0 10.3 5.7  NEUTROABS  --   --  12,630.00 8.0* 3,671.00  HGB 7.6* 8.1* 8.2* 8.2* 9.4*  HCT 24.6* 26.1* 26* 27.8* 29*  MCV 83.4 82.9  --  85.3  --   PLT 83* 97* 137* 226 95*   Lab Results  Component Value Date   TSH 3.82 10/20/2020   No results found for: HGBA1C Lab Results  Component Value Date   CHOL 165 10/20/2020   HDL 44 10/20/2020   LDLCALC 94 10/20/2020   TRIG 168 (A) 10/20/2020   CHOLHDL 3.8 11/23/2018    Significant Diagnostic Results in last 30 days:  No results found.  Assessment/Plan 1. Acute gout involving toe of left foot, unspecified cause - onset 01/06, no previous episodes - Uric acid 8.2 08/07/2020 - suspect from dehydration - she is on torsemide/ ARB- will reduce torsemide to QOD - plan to start allopurinol if second flare occurs  2. Closed trimalleolar fracture of right ankle with routine healing, subsequent encounter - cont PT/OT - using shoe with brace - remains WBAT  3. Pancytopenia ([facility]) - Hgb 9.1, Hct 28.9, platelets 131 08/07/2020 - followed by Dr. Manuella Ghazi in past- no further workup at this time  4. Chronic bilateral low back pain without  sciatica - cont scheduled tylenol, gabapentin, and oxycodone prn  5. Essential hypertension - controlled - cont losartan and torsemide  6. Bilateral leg edema - non pitting- baseline - will reduce torsemide to 20 mg po QOD  7. Acquired hypothyroidism - TSH normal - cont levothyroxine  8. Gastroesophageal reflux disease, unspecified whether esophagitis present - Hgb stable - cont omeprazole  9. Depression with anxiety - no mood changes or panic attacks - cont Wellbutrin, xanax, and zoloft  10. Chronic constipation - LBM 01/14, abdomen soft - cont senna and miralax  11. Pressure injury of deep tissue of left heel - slow healing - left heel scab intact - cont daily dressing changes  and Podon boot in bed    Family/ staff Communication: plan discussed with patient and nurse  Labs/tests ordered:  none

## 2021-08-24 ENCOUNTER — Encounter: Payer: Self-pay | Admitting: Orthopedic Surgery

## 2021-08-24 ENCOUNTER — Non-Acute Institutional Stay (SKILLED_NURSING_FACILITY): Payer: Medicare Other | Admitting: Orthopedic Surgery

## 2021-08-24 DIAGNOSIS — L89626 Pressure-induced deep tissue damage of left heel: Secondary | ICD-10-CM

## 2021-08-24 DIAGNOSIS — R600 Localized edema: Secondary | ICD-10-CM

## 2021-08-24 DIAGNOSIS — M545 Low back pain, unspecified: Secondary | ICD-10-CM | POA: Diagnosis not present

## 2021-08-24 DIAGNOSIS — I10 Essential (primary) hypertension: Secondary | ICD-10-CM | POA: Diagnosis not present

## 2021-08-24 DIAGNOSIS — E039 Hypothyroidism, unspecified: Secondary | ICD-10-CM

## 2021-08-24 DIAGNOSIS — D638 Anemia in other chronic diseases classified elsewhere: Secondary | ICD-10-CM

## 2021-08-24 DIAGNOSIS — G8929 Other chronic pain: Secondary | ICD-10-CM

## 2021-08-24 DIAGNOSIS — M109 Gout, unspecified: Secondary | ICD-10-CM

## 2021-08-24 DIAGNOSIS — F418 Other specified anxiety disorders: Secondary | ICD-10-CM

## 2021-08-24 DIAGNOSIS — L602 Onychogryphosis: Secondary | ICD-10-CM

## 2021-08-24 DIAGNOSIS — K219 Gastro-esophageal reflux disease without esophagitis: Secondary | ICD-10-CM

## 2021-08-24 DIAGNOSIS — S82851D Displaced trimalleolar fracture of right lower leg, subsequent encounter for closed fracture with routine healing: Secondary | ICD-10-CM | POA: Diagnosis not present

## 2021-08-24 NOTE — Progress Notes (Signed)
Location:   [facility] Room Number: 35-A Place of Service:  SNF (31) Provider:  Windell Moulding, NP    Patient Care Team: Virgie Dad, MD as PCP - General (Internal Medicine) Mast, Man X, NP as Nurse Practitioner (Internal Medicine)  Extended Emergency Contact Information Primary Emergency Contact: Bastone,[name] Address: [address]          [place], [place] [address] [place] Phone: [phone] Mobile Phone: [phone] Relation: Son Secondary Emergency Contact: [name] [place] Phone: [phone] Mobile Phone: [phone] Relation: Other  Code Status:  DNR Goals of care: Advanced Directive information Advanced Directives 08/24/2021  Does Patient Have a Medical Advance Directive? Yes  Type of Paramedic of [place];Out of facility DNR (pink MOST or yellow form)  Does patient want to make changes to medical advance directive? No - Patient declined  Copy of [place] in Chart? Yes - validated most recent copy scanned in chart (See row information)  Pre-existing out of facility DNR order (yellow form or pink MOST form) -     Chief Complaint  Patient presents with   Medical Management of Chronic Issues    Routine Visit.    Immunizations    Discuss the need for Pne vaccine, and Covid Booster.    HPI:  Pt is a 88 y.o. female seen today for medical management of chronic diseases.    She currently resides on the skilled nursing unit at [facility]. Past medical history includes: HTN, PVD, constipation, GERD, hypothyroidism, OA, osteoporosis, CKD3, cystitis, pancytopenia, depression/anxiety, weakness  HTN- BUN/creat 21/1.1 07/10/2021, remains on losartan, NAS diet BLE- non pitting, wears teds, elevates legs when she can, remains on torsemide daily Closed trimalleolar right ankle fracture- WBAT, brace discontinued, advanced to normal shoe with ankle brace, continues  with work with PT/OT, reports some mild pain to both ankle- requesting medication today Chronic back pain- pain stimulated with movement more than sitting, stable with scheduled tylenol, gabapentin and oxycodone prn Pancytopenia- Hgb 9.1, Hct 28.9, platelets 131 08/07/2020, seen by Dr. Alen Blew in past, no further biopsies due to age Hypothyroidism- TSH 3.82 10/20/2020, remains on levothyroxine GERD- hgb 9.1 08/07/2020, remains on Prilosec Depression with anxiety- no mood changes, remains on Wellbutrin, Zoloft and xanax prn Constipation- LBM 01/14, remains on senna daily and miralax prn Acute gout left great toe- resolved, uric acid level 8.2 08/07/2021, she denies pain today, she would like to start medication if she has second flare  No recent falls or injuries. Ambulates with wheelchair.   Recent blood pressures:  01/27- 110/56  01/26- 134/52  01/24- 150/84  Recent weights:  01/02- 170.5 lbs  12/07- 175.4 lbs  11/02- 179.1    Past Medical History:  Diagnosis Date   Anemia    Anxiety    Chronotropic incompetence 12/2012    potentially medication related; noted on CPET    DDD (degenerative disc disease)    Eczema    GERD (gastroesophageal reflux disease)    H/O hiatal hernia    Hx: UTI (urinary tract infection)    Hyperlipidemia    Hypertension    Hypothyroidism    Osteopenia    Septicemia ([place]) 2002   following UTI   Vertigo, benign positional    Past Surgical History:  Procedure Laterality Date   BREAST SURGERY     left biopsy   CATARACT EXTRACTION Right    2 weeks ago  CPET / MET - PFTS     Consistent with chronotropic incompetence; See attached report in the results section   DILATION AND CURETTAGE OF UTERUS     x 2   DOPPLER ECHOCARDIOGRAPHY  01/10/2013   Normal LV size and function. Normal EF. Air sclerosis but no stenosis   ESOPHAGOGASTRODUODENOSCOPY  05/04/2012   Procedure: ESOPHAGOGASTRODUODENOSCOPY (EGD);  Surgeon: Inda Castle, MD;  Location: [facility];  Service: Endoscopy;  Laterality: N/A;   EYE SURGERY     cataract extraction with ILO  ? eye   IR KYPHO EA ADDL LEVEL THORACIC OR LUMBAR  10/28/2016   IR KYPHO LUMBAR INC FX REDUCE BONE BX UNI/BIL CANNULATION INC/IMAGING  10/28/2016   IR KYPHO THORACIC WITH BONE BIOPSY  12/24/2016   IR RADIOLOGIST EVAL & MGMT  11/11/2016   KNEE ARTHROSCOPY  2011   right   ORIF ANKLE FRACTURE Right 03/27/2021   Procedure: OPEN REDUCTION INTERNAL FIXATION (ORIF) ANKLE FRACTURE;  Surgeon: Marchia Bond, MD;  Location: [facility] ORS;  Service: Orthopedics;  Laterality: Right;   TONSILLECTOMY     TOTAL KNEE ARTHROPLASTY  04/24/2012   Procedure: TOTAL KNEE ARTHROPLASTY;  Surgeon: Gearlean Alf, MD;  Location: [facility] ORS;  Service: Orthopedics;  Laterality: Right;   TRANSTHORACIC ECHOCARDIOGRAM  02/2018   Ordered by PCP for "congestive heart failure " -EF 60 M 65%.  GR 1 DD.  No R WMA--- > ESSENTIALLY NORMAL    No Known Allergies  Allergies as of 08/24/2021   No Known Allergies      Medication List        Accurate as of August 24, 2021 10:18 AM. If you have any questions, ask your nurse or doctor.          STOP taking these medications    polyvinyl alcohol 1.4 % ophthalmic solution Commonly known as: LIQUIFILM TEARS Stopped by: Yvonna Alanis, NP       TAKE these medications    acetaminophen 500 MG tablet Commonly known as: TYLENOL Take 1,000 mg by mouth every 8 (eight) hours.   ALPRAZolam 0.25 MG tablet Commonly known as: XANAX Take 0.25 mg by mouth daily.   ARTIFICIAL TEARS OP Place 1 drop into both eyes in the morning, at noon, and at bedtime.   atorvastatin 10 MG tablet Commonly known as: LIPITOR Take 5 mg by mouth at bedtime.   Biofreeze 4 % Gel Generic drug: Menthol (Topical Analgesic) Apply topically 2 (two) times daily as needed. Apply thin layer to left thigh   buPROPion 150 MG 12 hr tablet Commonly known as: WELLBUTRIN SR Take 1 tablet (150 mg total) by mouth 2 (two) times  daily after a meal.   calcium citrate-vitamin D 315-200 MG-UNIT tablet Commonly known as: CITRACAL+D Take 1 tablet by mouth 2 (two) times daily.   Camphor-Menthol-Methyl Sal 3.07-31-08 % Ptch Place 1 patch onto the skin See admin instructions. Apply one patch onto the skin of the  lower back daily for back pain   ferrous sulfate 325 (65 FE) MG EC tablet Take 325 mg by mouth every Monday, Wednesday, and Friday. With a meal   fish oil-omega-3 fatty acids 1000 MG capsule Take 1 g by mouth every morning.   gabapentin 100 MG capsule Commonly known as: NEURONTIN Take 100 mg by mouth 3 (three) times daily.   hydrocortisone cream 1 % Apply 1 application topically daily as needed for itching.   levothyroxine 75 MCG tablet Commonly known as: SYNTHROID Take 75  mcg by mouth every morning.   losartan 25 MG tablet Commonly known as: COZAAR Take 25 mg by mouth every morning.   nystatin powder Commonly known as: MYCOSTATIN/NYSTOP Apply 1 application topically 2 (two) times daily as needed (yeast).   omeprazole 40 MG capsule Commonly known as: PRILOSEC Take 40 mg by mouth every morning.   OxyCODONE HCl (Abuse Deter) 5 MG Taba Commonly known as: OXAYDO Take 2.5 mg by mouth 2 (two) times daily as needed. Give 30 minutes before transportation to ortho appointments.   polyethylene glycol 17 g packet Commonly known as: MIRALAX / GLYCOLAX Take 17 g by mouth daily as needed (constipation).   potassium chloride SA 20 MEQ tablet Commonly known as: KLOR-CON M Take 20 mEq by mouth 2 (two) times daily.   senna 8.6 MG Tabs tablet Commonly known as: SENOKOT Take 2 tablets by mouth at bedtime.   sertraline 25 MG tablet Commonly known as: ZOLOFT Take 75 mg by mouth every morning.   torsemide 20 MG tablet Commonly known as: DEMADEX Take 20 mg by mouth every other day.   zinc oxide 20 % ointment Apply 1 application topically See admin instructions. Apply topically to per/buttocks after  every incontinent episode and as needed for redness        Review of Systems  Constitutional:  Negative for activity change, appetite change, chills, fatigue and fever.  HENT:  Negative for congestion, hearing loss and trouble swallowing.   Eyes:  Negative for visual disturbance.  Respiratory:  Negative for cough, shortness of breath and wheezing.   Cardiovascular:  Positive for leg swelling. Negative for chest pain.  Gastrointestinal:  Positive for constipation. Negative for abdominal distention, abdominal pain, anal bleeding, blood in stool, diarrhea, nausea and vomiting.  Genitourinary:  Negative for dysuria, frequency and hematuria.  Musculoskeletal:  Positive for arthralgias and gait problem.  Skin:  Negative for wound.  Neurological:  Positive for weakness. Negative for dizziness and headaches.  Psychiatric/Behavioral:  Negative for confusion, dysphoric mood and sleep disturbance. The patient is not nervous/anxious.    Immunization History  Administered Date(s) Administered   Influenza, High Dose Seasonal PF 04/26/2016, 03/22/2017, 05/08/2019   Influenza,inj,Quad PF,6+ Mos 04/27/2018   Influenza-Unspecified 04/22/2011, 04/04/2012, 05/03/2013, 05/04/2013, 04/17/2014, 04/19/2015, 04/26/2016, 04/22/2017, 05/20/2021   Moderna SARS-COV2 Booster Vaccination 06/09/2020, 12/31/2020   Moderna Sars-Covid-2 Vaccination 07/30/2019, 08/27/2019   PFIZER(Purple Top)SARS-COV-2 Vaccination 04/15/2021   Pneumococcal Polysaccharide-23 08/26/2003, 09/06/2011, 05/15/2019   Pneumococcal-Unspecified 12/20/2013   Td 09/26/2006   Tdap 05/15/2019   Zoster Recombinat (Shingrix) 11/21/2017, 04/08/2018   Zoster, Live 10/25/2007   Pertinent  Health Maintenance Due  Topic Date Due   INFLUENZA VACCINE  Completed   DEXA SCAN  Completed   Fall Risk 03/30/2021 03/31/2021 03/31/2021 03/31/2021 04/04/2021  Falls in the past year? - - - - -  Was there an injury with Fall? - - - - -  Fall Risk Category Calculator -  - - - -  Fall Risk Category - - - - -  Patient Fall Risk Level High fall risk High fall risk High fall risk High fall risk High fall risk  Patient at Risk for Falls Due to - - - - -  Fall risk Follow up - - - - -   Functional Status Survey:    Vitals:   08/24/21 1012  BP: (!) 110/56  Pulse: 68  Resp: 18  Temp: 97.8 F (36.6 C)  SpO2: 95%  Weight: 170 lb 8 oz (77.3 kg)  Height: '5\' 4"'  (1.626 m)   Body mass index is 29.27 kg/m. Physical Exam Vitals reviewed.  Constitutional:      General: She is not in acute distress. HENT:     Head: Normocephalic.     Right Ear: There is no impacted cerumen.     Left Ear: There is no impacted cerumen.     Nose: Nose normal.     Mouth/Throat:     Mouth: Mucous membranes are moist.  Eyes:     General:        Right eye: No discharge.        Left eye: No discharge.  Neck:     Vascular: No carotid bruit.  Cardiovascular:     Rate and Rhythm: Normal rate and regular rhythm.     Pulses:          Dorsalis pedis pulses are 1+ on the right side and 1+ on the left side.       Posterior tibial pulses are 1+ on the right side and 1+ on the left side.     Heart sounds: Normal heart sounds. No murmur heard. Pulmonary:     Effort: Pulmonary effort is normal. No respiratory distress.     Breath sounds: Normal breath sounds. No wheezing.  Abdominal:     General: Bowel sounds are normal. There is no distension.     Palpations: Abdomen is soft.     Tenderness: There is no abdominal tenderness.  Musculoskeletal:     Cervical back: Neck supple.     Right lower leg: No edema.     Left lower leg: No edema.  Feet:     Right foot:     Protective Sensation: 10 sites tested.  10 sites sensed.     Skin integrity: Dry skin present. No skin breakdown, erythema or warmth.     Toenail Condition: Right toenails are abnormally thick. Fungal disease present.    Left foot:     Protective Sensation: 10 sites tested.  10 sites sensed.     Skin integrity:  Erythema and dry skin present. No skin breakdown or warmth.     Toenail Condition: Left toenails are abnormally thick. Fungal disease present.    Comments: Left great toe with mild erythema, no skin breakdown to heels Lymphadenopathy:     Cervical: No cervical adenopathy.  Skin:    General: Skin is warm and dry.     Capillary Refill: Capillary refill takes less than 2 seconds.  Neurological:     General: No focal deficit present.     Mental Status: She is alert and oriented to person, place, and time.     Motor: Weakness present.     Gait: Gait abnormal.     Comments: wheelchair  Psychiatric:        Mood and Affect: Mood normal.        Behavior: Behavior normal.    Labs reviewed: Recent Labs    03/28/21 0547 03/30/21 0503 04/04/21 1827 07/10/21 0000  NA 137 137 145 138  K 4.0 3.5 4.5 4.3  CL 104 100 103 103  CO2 '28 31 31 ' 27*  GLUCOSE 117* 101* 104*  --   BUN 22 36* 27* 21  CREATININE 1.00 1.31* 0.98 1.1  CALCIUM 9.2 8.9 9.7 9.6   Recent Labs    03/27/21 0543 04/04/21 1827 07/10/21 0000  AST 21 37 22  ALT '16 24 19  ' ALKPHOS 69 95 91  BILITOT 0.4 0.5  --  PROT 7.4 7.9  --   ALBUMIN 3.7 3.2* 3.8   Recent Labs    03/30/21 0503 03/31/21 0444 04/03/21 0000 04/04/21 1827 07/10/21 0000  WBC 10.7* 9.3 15.0 10.3 5.7  NEUTROABS  --   --  12,630.00 8.0* 3,671.00  HGB 7.6* 8.1* 8.2* 8.2* 9.4*  HCT 24.6* 26.1* 26* 27.8* 29*  MCV 83.4 82.9  --  85.3  --   PLT 83* 97* 137* 226 95*   Lab Results  Component Value Date   TSH 3.82 10/20/2020   No results found for: HGBA1C Lab Results  Component Value Date   CHOL 165 10/20/2020   HDL 44 10/20/2020   LDLCALC 94 10/20/2020   TRIG 168 (A) 10/20/2020   CHOLHDL 3.8 11/23/2018    Significant Diagnostic Results in last 30 days:  No results found.  Assessment/Plan 1. Essential hypertension - controlled - cont losartan  2. Bilateral leg edema - cont torsemide QOD  3. Closed trimalleolar fracture of right ankle  with routine healing, subsequent encounter - reports some intermittent mild pain to right ankle - cont scheduled tylenol - start voltaren gel- apply to both ankles bid  4. Chronic bilateral low back pain without sciatica - cont scheduled tylenol, gabapentin and oxycodone prn  5. Anemia of chronic disease - Hgb 9.1 08/07/2021 - cont ferrous sulfate  6. Acquired hypothyroidism - TSH normal -cont levothyroxine  7. Gastroesophageal reflux disease, unspecified whether esophagitis present - cont omeprazole  8. Depression with anxiety - no recent mood changes or panic attacks  9. Acute gout involving toe of left foot, unspecified cause - uric acid level 8.2 08/07/2021 - left great toe with mild erythema, denies pain today - will consider starting allopurinol if she has another flare  10. Pressure injury of deep tissue of left heel - resolved - cont Podon boots and skin prep  11. Onychauxis - toenails thick and long L>R - podiatry consult    Family/ staff Communication: plan discussed with patient and nurse  Labs/tests ordered: none

## 2021-08-28 DIAGNOSIS — M79672 Pain in left foot: Secondary | ICD-10-CM | POA: Diagnosis not present

## 2021-08-28 DIAGNOSIS — M79671 Pain in right foot: Secondary | ICD-10-CM | POA: Diagnosis not present

## 2021-08-28 DIAGNOSIS — B351 Tinea unguium: Secondary | ICD-10-CM | POA: Diagnosis not present

## 2021-08-28 DIAGNOSIS — L84 Corns and callosities: Secondary | ICD-10-CM | POA: Diagnosis not present

## 2021-09-02 DIAGNOSIS — R2681 Unsteadiness on feet: Secondary | ICD-10-CM | POA: Diagnosis not present

## 2021-09-02 DIAGNOSIS — M545 Low back pain, unspecified: Secondary | ICD-10-CM | POA: Diagnosis not present

## 2021-09-02 DIAGNOSIS — Z9181 History of falling: Secondary | ICD-10-CM | POA: Diagnosis not present

## 2021-09-02 DIAGNOSIS — M6281 Muscle weakness (generalized): Secondary | ICD-10-CM | POA: Diagnosis not present

## 2021-09-03 DIAGNOSIS — Z9181 History of falling: Secondary | ICD-10-CM | POA: Diagnosis not present

## 2021-09-03 DIAGNOSIS — S82851D Displaced trimalleolar fracture of right lower leg, subsequent encounter for closed fracture with routine healing: Secondary | ICD-10-CM | POA: Diagnosis not present

## 2021-09-03 DIAGNOSIS — M6281 Muscle weakness (generalized): Secondary | ICD-10-CM | POA: Diagnosis not present

## 2021-09-07 DIAGNOSIS — M6281 Muscle weakness (generalized): Secondary | ICD-10-CM | POA: Diagnosis not present

## 2021-09-07 DIAGNOSIS — S82851D Displaced trimalleolar fracture of right lower leg, subsequent encounter for closed fracture with routine healing: Secondary | ICD-10-CM | POA: Diagnosis not present

## 2021-09-07 DIAGNOSIS — Z9181 History of falling: Secondary | ICD-10-CM | POA: Diagnosis not present

## 2021-09-09 DIAGNOSIS — R2681 Unsteadiness on feet: Secondary | ICD-10-CM | POA: Diagnosis not present

## 2021-09-09 DIAGNOSIS — M545 Low back pain, unspecified: Secondary | ICD-10-CM | POA: Diagnosis not present

## 2021-09-09 DIAGNOSIS — Z9181 History of falling: Secondary | ICD-10-CM | POA: Diagnosis not present

## 2021-09-09 DIAGNOSIS — M6281 Muscle weakness (generalized): Secondary | ICD-10-CM | POA: Diagnosis not present

## 2021-09-10 ENCOUNTER — Other Ambulatory Visit: Payer: Self-pay

## 2021-09-10 MED ORDER — ALPRAZOLAM 0.25 MG PO TABS: 0.2500 mg | ORAL_TABLET | Freq: Every day | ORAL | 5 refills | Status: DC

## 2021-09-10 NOTE — Telephone Encounter (Signed)
Incoming fax received from [facility] for refill on [facility] skilled patient

## 2021-09-14 DIAGNOSIS — S82851D Displaced trimalleolar fracture of right lower leg, subsequent encounter for closed fracture with routine healing: Secondary | ICD-10-CM | POA: Diagnosis not present

## 2021-09-14 DIAGNOSIS — Z9181 History of falling: Secondary | ICD-10-CM | POA: Diagnosis not present

## 2021-09-14 DIAGNOSIS — M6281 Muscle weakness (generalized): Secondary | ICD-10-CM | POA: Diagnosis not present

## 2021-09-16 ENCOUNTER — Non-Acute Institutional Stay (SKILLED_NURSING_FACILITY): Payer: Medicare Other | Admitting: Orthopedic Surgery

## 2021-09-16 ENCOUNTER — Encounter: Payer: Self-pay | Admitting: Orthopedic Surgery

## 2021-09-16 DIAGNOSIS — M109 Gout, unspecified: Secondary | ICD-10-CM | POA: Diagnosis not present

## 2021-09-16 DIAGNOSIS — F418 Other specified anxiety disorders: Secondary | ICD-10-CM

## 2021-09-16 DIAGNOSIS — M6281 Muscle weakness (generalized): Secondary | ICD-10-CM | POA: Diagnosis not present

## 2021-09-16 DIAGNOSIS — D638 Anemia in other chronic diseases classified elsewhere: Secondary | ICD-10-CM | POA: Diagnosis not present

## 2021-09-16 DIAGNOSIS — Z66 Do not resuscitate: Secondary | ICD-10-CM | POA: Diagnosis not present

## 2021-09-16 DIAGNOSIS — I10 Essential (primary) hypertension: Secondary | ICD-10-CM

## 2021-09-16 DIAGNOSIS — K219 Gastro-esophageal reflux disease without esophagitis: Secondary | ICD-10-CM | POA: Diagnosis not present

## 2021-09-16 DIAGNOSIS — R413 Other amnesia: Secondary | ICD-10-CM | POA: Diagnosis not present

## 2021-09-16 DIAGNOSIS — Z9181 History of falling: Secondary | ICD-10-CM | POA: Diagnosis not present

## 2021-09-16 DIAGNOSIS — R600 Localized edema: Secondary | ICD-10-CM | POA: Diagnosis not present

## 2021-09-16 DIAGNOSIS — R2681 Unsteadiness on feet: Secondary | ICD-10-CM | POA: Diagnosis not present

## 2021-09-16 DIAGNOSIS — G4700 Insomnia, unspecified: Secondary | ICD-10-CM

## 2021-09-16 DIAGNOSIS — G8929 Other chronic pain: Secondary | ICD-10-CM

## 2021-09-16 DIAGNOSIS — S82851D Displaced trimalleolar fracture of right lower leg, subsequent encounter for closed fracture with routine healing: Secondary | ICD-10-CM | POA: Diagnosis not present

## 2021-09-16 DIAGNOSIS — E039 Hypothyroidism, unspecified: Secondary | ICD-10-CM

## 2021-09-16 DIAGNOSIS — M545 Low back pain, unspecified: Secondary | ICD-10-CM | POA: Diagnosis not present

## 2021-09-16 MED ORDER — MELATONIN 5 MG PO TABS: 5.0000 mg | ORAL_TABLET | Freq: Every day | ORAL | 0 refills | Status: DC

## 2021-09-16 NOTE — Progress Notes (Signed)
Location:  [name]: N35/A Place of Service:  SNF (31) Provider: Yvonna Alanis, NP  Patient Care Team: Virgie Dad, MD as PCP - General (Internal Medicine) Mast, Man X, NP as Nurse Practitioner (Internal Medicine)  Extended Emergency Contact Information Primary Emergency Contact: [name] [name] Address: [address]          [place], [place] [address] [place] Phone: [phone] Mobile Phone: [phone] Relation: Son Secondary Emergency Contact: [name] [name] [place] of [place] Phone: [phone] Mobile Phone: [phone] Relation: Other  Code Status:  DNR Goals of care: Advanced Directive information Advanced Directives 09/16/2021  Does Patient Have a Medical Advance Directive? Yes  Type of Paramedic of [place];Out of facility DNR (pink MOST or yellow form)  Does patient want to make changes to medical advance directive? No - Patient declined  Copy of [place] in Chart? Yes - validated most recent copy scanned in chart (See row information)  Pre-existing out of facility DNR order (yellow form or pink MOST form) -     Chief Complaint  Patient presents with   Medical Management of Chronic Issues    Routine visit. Discuss the need for Pneumonia vaccine and  additional COVID booster or post pone if patient refuses.      HPI:  Pt is a 88 y.o. female seen today for medical management of chronic diseases.    She currently resides on the skilled nursing unit at [facility]. Past medical history includes: HTN, PVD, constipation, GERD, hypothyroidism, OA, osteoporosis, CKD3, cystitis, pancytopenia, depression/anxiety, weakness  HTN- BUN/creat 21/1.1 07/10/2021, remains on losartan, NAS diet BLE- non pitting, wears teds, elevates legs in bed, remains on torsemide daily Insomnia- reports she is not sleeping well in the past week- having trouble falling asleep,  averaging a few hours a night Impaired memory- BIMS score 15 06/30/2021, reports feeling more forgetful lately- she has cried to nursing a few times about it, appropriate today  Closed trimalleolar right ankle fracture- WBAT, brace discontinued, advanced to normal shoe with ankle brace, continues with work with PT once a week Chronic back pain- increased with movement, remains on scheduled tylenol, gabapentin and oxycodone prn Anemia - Hgb 9.1, Hct 28.9, platelets 131 08/07/2020, remains on ferrous sulfate Hypothyroidism- TSH 3.82 10/20/2020, remains on levothyroxine GERD- hgb 9.1 08/07/2020, remains on Prilosec Depression with anxiety- no mood changes, remains on Wellbutrin, Zoloft and xanax prn Constipation- LBM 02/21, remains on senna daily and miralax prn Acute gout left great toe- last flare 07/2021, uric acid level 8.2 08/07/2021, she would like to start medication if a second flare occurs  Requesting yearly eye exam in-house.   Recent blood pressures:  02/21- 151/74, 157/74  02/20- 133/62  02/19- 120/66  Recent weights:  02/01- 168.8 lbs  01/02- 170.5 lbs  12/21- 172.8 lbs   Past Medical History:  Diagnosis Date   Anemia    Anxiety    Chronotropic incompetence 12/2012    potentially medication related; noted on CPET    DDD (degenerative disc disease)    Eczema    GERD (gastroesophageal reflux disease)    H/O hiatal hernia    Hx: UTI (urinary tract infection)    Hyperlipidemia    Hypertension    Hypothyroidism    Osteopenia    Septicemia ([place]) 2002   following UTI   Vertigo, benign positional    Past Surgical History:  Procedure Laterality Date  BREAST SURGERY     left biopsy   CATARACT EXTRACTION Right    2 weeks ago   CPET / MET - PFTS     Consistent with chronotropic incompetence; See attached report in the results section   DILATION AND CURETTAGE OF UTERUS     x 2   DOPPLER ECHOCARDIOGRAPHY  01/10/2013   Normal LV size and function. Normal EF. Air  sclerosis but no stenosis   ESOPHAGOGASTRODUODENOSCOPY  05/04/2012   Procedure: ESOPHAGOGASTRODUODENOSCOPY (EGD);  Surgeon: Inda Castle, MD;  Location: [facility];  Service: Endoscopy;  Laterality: N/A;   EYE SURGERY     cataract extraction with ILO  ? eye   IR KYPHO EA ADDL LEVEL THORACIC OR LUMBAR  10/28/2016   IR KYPHO LUMBAR INC FX REDUCE BONE BX UNI/BIL CANNULATION INC/IMAGING  10/28/2016   IR KYPHO THORACIC WITH BONE BIOPSY  12/24/2016   IR RADIOLOGIST EVAL & MGMT  11/11/2016   KNEE ARTHROSCOPY  2011   right   ORIF ANKLE FRACTURE Right 03/27/2021   Procedure: OPEN REDUCTION INTERNAL FIXATION (ORIF) ANKLE FRACTURE;  Surgeon: Marchia Bond, MD;  Location: [facility];  Service: Orthopedics;  Laterality: Right;   TONSILLECTOMY     TOTAL KNEE ARTHROPLASTY  04/24/2012   Procedure: TOTAL KNEE ARTHROPLASTY;  Surgeon: Gearlean Alf, MD;  Location: [facility];  Service: Orthopedics;  Laterality: Right;   TRANSTHORACIC ECHOCARDIOGRAM  02/2018   Ordered by PCP for "congestive heart failure " -EF 60 M 65%.  GR 1 DD.  No R WMA--- > ESSENTIALLY NORMAL    No Known Allergies  Outpatient Encounter Medications as of 09/16/2021  Medication Sig   acetaminophen (TYLENOL) 500 MG tablet Take 1,000 mg by mouth every 8 (eight) hours.   ALPRAZolam (XANAX) 0.25 MG tablet Take 1 tablet (0.25 mg total) by mouth daily.   atorvastatin (LIPITOR) 10 MG tablet Take 5 mg by mouth at bedtime.   buPROPion (WELLBUTRIN SR) 150 MG 12 hr tablet Take 1 tablet (150 mg total) by mouth 2 (two) times daily after a meal.   calcium citrate-vitamin D (CITRACAL+D) 315-200 MG-UNIT tablet Take 1 tablet by mouth 2 (two) times daily.   Camphor-Menthol-Methyl Sal 3.07-31-08 % PTCH Place 1 patch onto the skin See admin instructions. Apply one patch onto the skin of the  lower back daily for back pain   Carboxymethylcellulose Sodium (ARTIFICIAL TEARS OP) Place 1 drop into both eyes in the morning, at noon, and at bedtime.   ferrous sulfate 325 (65  FE) MG EC tablet Take 325 mg by mouth every Monday, Wednesday, and Friday. With a meal   fish oil-omega-3 fatty acids 1000 MG capsule Take 1 g by mouth every morning.   gabapentin (NEURONTIN) 100 MG capsule Take 100 mg by mouth 3 (three) times daily.   hydrocortisone cream 1 % Apply 1 application topically daily as needed for itching.   levothyroxine (SYNTHROID) 75 MCG tablet Take 75 mcg by mouth every morning.   losartan (COZAAR) 25 MG tablet Take 25 mg by mouth every morning.   Menthol, Topical Analgesic, (BIOFREEZE) 4 % GEL Apply topically 2 (two) times daily as needed. Apply thin layer to left thigh   nystatin (MYCOSTATIN/NYSTOP) powder Apply 1 application topically 2 (two) times daily as needed (yeast).   omeprazole (PRILOSEC) 40 MG capsule Take 40 mg by mouth every morning.   OxyCODONE HCl, Abuse Deter, (OXAYDO) 5 MG TABA Take 2.5 mg by mouth 2 (two) times daily as needed. Give 30  minutes before transportation to ortho appointments.   polyethylene glycol (MIRALAX / GLYCOLAX) 17 g packet Take 17 g by mouth daily as needed (constipation).   senna (SENOKOT) 8.6 MG TABS tablet Take 2 tablets by mouth at bedtime.   sertraline (ZOLOFT) 25 MG tablet Take 75 mg by mouth every morning.   torsemide (DEMADEX) 20 MG tablet Take 20 mg by mouth every other day.   zinc oxide 20 % ointment Apply 1 application topically See admin instructions. Apply topically to per/buttocks after every incontinent episode and as needed for redness   [DISCONTINUED] potassium chloride SA (KLOR-CON) 20 MEQ tablet Take 20 mEq by mouth 2 (two) times daily. (Patient not taking: Reported on 09/16/2021)   No facility-administered encounter medications on file as of 09/16/2021.    Review of Systems  Constitutional:  Negative for activity change, appetite change, chills, fatigue and fever.  HENT:  Negative for hearing loss and trouble swallowing.   Eyes:  Negative for photophobia and visual disturbance.  Respiratory:  Negative for  cough, shortness of breath and wheezing.   Cardiovascular:  Positive for leg swelling. Negative for chest pain.  Gastrointestinal:  Positive for constipation. Negative for abdominal distention, abdominal pain, blood in stool, diarrhea, nausea and vomiting.  Genitourinary:  Negative for dysuria, frequency and hematuria.  Musculoskeletal:  Positive for arthralgias, back pain and gait problem.  Skin:  Negative for wound.  Neurological:  Positive for weakness. Negative for dizziness and headaches.  Psychiatric/Behavioral:  Positive for confusion, dysphoric mood and sleep disturbance. The patient is nervous/anxious.    Immunization History  Administered Date(s) Administered   Influenza, High Dose Seasonal PF 04/26/2016, 03/22/2017, 05/08/2019   Influenza,inj,Quad PF,6+ Mos 04/27/2018   Influenza-Unspecified 04/22/2011, 04/04/2012, 05/03/2013, 05/04/2013, 04/17/2014, 04/19/2015, 04/26/2016, 04/22/2017, 05/20/2021   Moderna SARS-COV2 Booster Vaccination 06/09/2020, 12/31/2020   Moderna Sars-Covid-2 Vaccination 07/30/2019, 08/27/2019   Pfizer Covid-19 Vaccine Bivalent Booster 71yr & up 04/15/2021   Pneumococcal Polysaccharide-23 08/26/2003, 09/06/2011, 05/15/2019   Pneumococcal-Unspecified 12/20/2013   Td 09/26/2006   Tdap 05/15/2019   Zoster Recombinat (Shingrix) 11/21/2017, 04/08/2018   Zoster, Live 10/25/2007   Pertinent  Health Maintenance Due  Topic Date Due   INFLUENZA VACCINE  Completed   DEXA SCAN  Completed   Fall Risk 03/30/2021 03/31/2021 03/31/2021 03/31/2021 04/04/2021  Falls in the past year? - - - - -  Was there an injury with Fall? - - - - -  Fall Risk Category Calculator - - - - -  Fall Risk Category - - - - -  Patient Fall Risk Level _0   Patient at Risk for Falls Due to - - - - -  Fall risk Follow up - - - - -   Functional Status Survey:    Vitals:   09/16/21 0950  BP: (!) 151/74  Pulse: 62  Resp:  20  Temp: (!) 97.5 F (36.4 C)  SpO2: 92%  Weight: 168 lb 12.8 oz (76.6 kg)  Height: _1  (1.626 m)   Body mass index is 28.97 kg/m. Physical Exam Vitals reviewed.  Constitutional:      General: She is not in acute distress. HENT:     Head: Normocephalic.     Right Ear: There is no impacted cerumen.     Left Ear: There is no impacted cerumen.     Nose: Nose normal.     Mouth/Throat:     Mouth: Mucous membranes are  moist.  Eyes:     General:        Right eye: No discharge.        Left eye: No discharge.  Cardiovascular:     Rate and Rhythm: Normal rate and regular rhythm.     Pulses: Normal pulses.     Heart sounds: Normal heart sounds.  Pulmonary:     Effort: Pulmonary effort is normal. No respiratory distress.     Breath sounds: Normal breath sounds. No wheezing.  Abdominal:     General: Bowel sounds are normal. There is no distension.     Palpations: Abdomen is soft.     Tenderness: There is no abdominal tenderness.  Musculoskeletal:     Cervical back: Neck supple.     Right lower leg: Edema present.     Left lower leg: Edema present.     Comments: Non pitting  Skin:    General: Skin is warm and dry.     Capillary Refill: Capillary refill takes less than 2 seconds.     Comments: Ecchymosis to right anterior shin, purple yellow in color, no skin breakdown. Left great toe with mild erythema, non tender, no skin breakdown  Neurological:     General: No focal deficit present.     Mental Status: She is alert and oriented to person, place, and time.     Motor: Weakness present.     Gait: Gait abnormal.     Comments: Walker/ wheelchair  Psychiatric:        Mood and Affect: Mood normal.        Behavior: Behavior normal.    Labs reviewed: Recent Labs    03/28/21 0547 03/30/21 0503 04/04/21 1827 07/10/21 0000  NA 137 137 145 138  K 4.0 3.5 4.5 4.3  CL 104 100 103 103  CO2 _0 27*  GLUCOSE 117* 101* 104*  --   BUN 22 36* 27* 21  CREATININE 1.00 1.31*  0.98 1.1  CALCIUM 9.2 8.9 9.7 9.6   Recent Labs    03/27/21 0543 04/04/21 1827 07/10/21 0000  AST 21 37 22  ALT _1 ALKPHOS 69 95 91  BILITOT 0.4 0.5  --   PROT 7.4 7.9  --   ALBUMIN 3.7 3.2* 3.8   Recent Labs    03/30/21 0503 03/31/21 0444 04/03/21 0000 04/04/21 1827 07/10/21 0000 08/07/21 0000  WBC 10.7* 9.3   < > 10.3 5.7 9.2  NEUTROABS  --   --    < > 8.0* 3,671.00 6,771.00  HGB 7.6* 8.1*   < > 8.2* 9.4* 9.1*  HCT 24.6* 26.1*   < > 27.8* 29* 29*  MCV 83.4 82.9  --  85.3  --   --   PLT 83* 97*   < > 226 95* 131*   < > = values in this interval not displayed.   Lab Results  Component Value Date   TSH 3.82 10/20/2020   No results found for: HGBA1C Lab Results  Component Value Date   CHOL 165 10/20/2020   HDL 44 10/20/2020   LDLCALC 94 10/20/2020   TRIG 168 (A) 10/20/2020   CHOLHDL 3.8 11/23/2018    Significant Diagnostic Results in last 30 days:  No results found.  Assessment/Plan 1. Do not resuscitate  2. Essential hypertension - controlled - cont losartan - cont NAS diet  3. Bilateral leg edema - non pitting today - cont teds and leg elevation - cont torsemide  4. Insomnia, unspecified type - trouble falling asleep  - will trial melatonin 5 mg po qhs  5. Memory impairment - recent BIMS 15 06/2021 - consider MMSE in future  6. Closed trimalleolar fracture of right ankle with routine healing, subsequent encounter - continues to work with PT   7. Chronic bilateral low back pain without sciatica -cont scheduled tylenol, gabapentin and oxycodone prn  8. Anemia of chronic disease - hgb stable - cont ferrous sulfate  9. Acquired hypothyroidism - tsh stable - cont levothyroxine - TSH- 09/2021  10. Gastroesophageal reflux disease, unspecified whether esophagitis present - cont omeprazole  11. Depression with anxiety - no mood changes, sometimes cries - cont Zoloft and xanax  12. Acute gout involving toe of left foot,  unspecified cause - 1st flare 07/2021, uric acid > 8 07/2021 - interested in starting medication if second flare occurs  - encourage to stay hydrated    Family/ staff Communication: plan discussed with patient and nurse  Labs/tests ordered:  yearly eye exam

## 2021-09-17 LAB — BASIC METABOLIC PANEL
BUN: 20 (ref 4–21)
CO2: 27 — AB (ref 13–22)
Chloride: 106 (ref 99–108)
Creatinine: 1 (ref 0.5–1.1)
Glucose: 69
Potassium: 4.2 (ref 3.4–5.3)
Sodium: 138 (ref 137–147)

## 2021-09-17 LAB — COMPREHENSIVE METABOLIC PANEL
Calcium: 9.3 (ref 8.7–10.7)
eGFR: 53

## 2021-09-17 LAB — TSH: TSH: 3.49 (ref 0.41–5.90)

## 2021-09-17 LAB — CBC AND DIFFERENTIAL
HCT: 31 — AB (ref 36–46)
Hemoglobin: 9.1 — AB (ref 12.0–16.0)
Neutrophils Absolute: 2601
Platelets: 109 — AB (ref 150–399)
WBC: 5.1

## 2021-09-17 LAB — CBC: RBC: 3.32 — AB (ref 3.87–5.11)

## 2021-09-18 ENCOUNTER — Encounter: Payer: Self-pay | Admitting: Orthopedic Surgery

## 2021-09-18 ENCOUNTER — Non-Acute Institutional Stay (INDEPENDENT_AMBULATORY_CARE_PROVIDER_SITE_OTHER): Payer: Medicare Other | Admitting: Orthopedic Surgery

## 2021-09-18 DIAGNOSIS — Z0000 Encounter for general adult medical examination without abnormal findings: Secondary | ICD-10-CM

## 2021-09-18 NOTE — Patient Instructions (Signed)
°  [name] [name] , Thank you for taking time to come for your Medicare Wellness Visit. I appreciate your ongoing commitment to your health goals. Please review the following plan we discussed and let me know if I can assist you in the future.   These are the goals we discussed:  Goals      Absence of Fall and Fall-Related Injury     Evidence-based guidance:  Assess fall risk using a validated tool when available. Consider balance and gait impairment, muscle weakness, diminished vision or hearing, environmental hazards, presence of urinary or bowel urgency and/or incontinence.  Communicate fall injury risk to interprofessional healthcare team.  Develop a fall prevention plan with the patient and family.  Promote use of personal vision and auditory aids.  Promote reorientation, appropriate sensory stimulation, and routines to decrease risk of fall when changes in mental status are present.  Assess assistance level required for safe and effective self-care; consider referral for home care.  Encourage physical activity, such as performance of self-care at highest level of ability, strength and balance exercise program, and provision of appropriate assistive devices; refer to rehabilitation therapy.  Refer to community-based fall prevention program where available.  If fall occurs, determine the cause and revise fall injury prevention plan.  Regularly review medication contribution to fall risk; consider risk related to polypharmacy and age.  Refer to pharmacist for consultation when concerns about medications are revealed.  Balance adequate pain management with potential for oversedation.  Provide guidance related to environmental modifications.  Consider supplementation with Vitamin D.   Notes:      Functional Decline Minimized     Evidence-based guidance:  Assess fall risk, including balance and gait impairment, muscle weakness, diminished vision or hearing, environmental hazards, effects of  medication and dehydration.  Assess and review gait speed, decreased muscle strength or impaired functional mobility; consider use of validated tool if available.  Prepare patient for rehabilitation services to develop an individualized activity and exercise program as indicated, such as progressive resistance strengthening, balance and activity tolerance training or home exercise program.  Prevent falls with environmental adjustment; encourage compliance with exercise program, management of incontinence and adequate vitamin D and calcium intake from food or supplements.  Promote gradual increase in intensity of activity and exercise as tolerated, such as duration, frequency, exercise repetition and sets and intensity.  Provide guidance and interventions aimed at fall prevention.  Review or assess presence of reduced muscle mass or results of dual-energy x-ray absorptiometry.   Notes:         This is a list of the screening recommended for you and due dates:  Health Maintenance  Topic Date Due   Pneumonia Vaccine (2 - PCV) 05/14/2020   COVID-19 Vaccine (4 - Booster) 06/10/2021   Tetanus Vaccine  05/14/2029   Flu Shot  Completed   DEXA scan (bone density measurement)  Completed   Zoster (Shingles) Vaccine  Completed   HPV Vaccine  Aged Out

## 2021-09-18 NOTE — Progress Notes (Signed)
Subjective:   [name] [name] is a 88 y.o. female who presents for Medicare Annual (Subsequent) preventive examination.  Review of Systems     Cardiac Risk Factors include: advanced age (>81mn, >>19women);hypertension;sedentary lifestyle     Objective:    Today's Vitals   09/18/21 0932  BP: (!) 141/71  Pulse: 62  Resp: 20  Temp: 97.8 F (36.6 C)  SpO2: 95%  Weight: 168 lb 12.8 oz (76.6 kg)  Height: '5\' 4"'  (1.626 m)   Body mass index is 28.97 kg/m.  Advanced Directives 09/18/2021 09/16/2021 08/24/2021 08/10/2021 07/31/2021 07/06/2021 06/04/2021  Does Patient Have a Medical Advance Directive? Yes Yes Yes Yes Yes Yes Yes  Type of AParamed[place]of [place] of facility DNR (pink MOST or yellow form) [place] of facility DNR (pink MOST or yellow form) HFort WayneOut of facility DNR (pink MOST or yellow form) [place] of facility DNR (pink MOST or yellow form) [place] of facility DNR (pink MOST or yellow form) H[place] of facility DNR (pink MOST or yellow form) HC[place]leyOut of facility DNR (pink MOST or yellow form)  Does patient want to make changes to medical advance directive? No - Patient declined No - Patient declined No - Patient declined No - Patient declined No - Patient declined No - Patient declined No - Patient declined  Copy of [place] Chart? Yes - validated most recent copy scanned in chart (See row information) Yes - validated most recent copy scanned in chart (See row information) Yes - validated most recent copy scanned in chart (See row information) Yes - validated most recent copy scanned in chart (See row information) Yes - validated most recent copy scanned in chart (See row information) Yes - validated most recent copy scanned in chart (See row information) Yes - validated most recent copy scanned  in chart (See row information)  Pre-existing out of facility DNR order (yellow form or pink MOST form) - - - - Pink MOST form placed in chart (order not valid for inpatient use);Yellow form placed in chart (order not valid for inpatient use) Pink MOST form placed in chart (order not valid for inpatient use);Yellow form placed in chart (order not valid for inpatient use) Pink MOST form placed in chart (order not valid for inpatient use);Yellow form placed in chart (order not valid for inpatient use)    Current Medications (verified) Outpatient Encounter Medications as of 09/18/2021  Medication Sig   acetaminophen (TYLENOL) 500 MG tablet Take 1,000 mg by mouth every 8 (eight) hours. For pain   ALPRAZolam (XANAX) 0.25 MG tablet Take 1 tablet (0.25 mg total) by mouth daily.   atorvastatin (LIPITOR) 10 MG tablet Take 5 mg by mouth at bedtime.   buPROPion (WELLBUTRIN SR) 150 MG 12 hr tablet Take 1 tablet (150 mg total) by mouth 2 (two) times daily after a meal.   calcium citrate-vitamin D (CITRACAL+D) 315-200 MG-UNIT tablet Take 1 tablet by mouth 2 (two) times daily.   Camphor-Menthol-Methyl Sal 3.07-31-08 % PTCH Place 1 patch onto the skin See admin instructions. Apply one patch onto the skin of the  lower back daily for back pain   Carboxymethylcellulose Sodium (ARTIFICIAL TEARS OP) Place 1 drop into both eyes in the morning, at noon, and at bedtime.   diclofenac Sodium (VOLTAREN) 1 % GEL Apply 4 g topically in the morning and at bedtime. Apply  to bilateral ankles BID   ferrous sulfate 325 (65 FE) MG EC tablet Take 325 mg by mouth every Monday, Wednesday, and Friday. With a meal   fish oil-omega-3 fatty acids 1000 MG capsule Take 1 g by mouth every morning.   gabapentin (NEURONTIN) 100 MG capsule Take 100 mg by mouth 3 (three) times daily.   hydrocortisone cream 1 % Apply 1 application topically daily as needed for itching.   levothyroxine (SYNTHROID) 75 MCG tablet Take 75 mcg by mouth every morning.    losartan (COZAAR) 25 MG tablet Take 25 mg by mouth every morning.   melatonin 5 MG TABS Take 1 tablet (5 mg total) by mouth at bedtime.   Menthol, Topical Analgesic, (BIOFREEZE) 4 % GEL Apply topically 2 (two) times daily as needed. Apply thin layer to left thigh   nystatin (MYCOSTATIN/NYSTOP) powder Apply 1 application topically 2 (two) times daily as needed (yeast).   omeprazole (PRILOSEC) 40 MG capsule Take 40 mg by mouth every morning.   OxyCODONE HCl, Abuse Deter, (OXAYDO) 5 MG TABA Take 2.5 mg by mouth 2 (two) times daily as needed. Give 30 minutes before transportation to ortho appointments.   polyethylene glycol (MIRALAX / GLYCOLAX) 17 g packet Take 17 g by mouth daily as needed (constipation).   potassium chloride SA (KLOR-CON M) 20 MEQ tablet Take 20 mEq by mouth in the morning.   senna (SENOKOT) 8.6 MG TABS tablet Take 2 tablets by mouth at bedtime.   sertraline (ZOLOFT) 25 MG tablet Take 75 mg by mouth every morning.   torsemide (DEMADEX) 20 MG tablet Take 20 mg by mouth every other day.   zinc oxide 20 % ointment Apply 1 application topically See admin instructions. Apply topically to per/buttocks after every incontinent episode and as needed for redness   No facility-administered encounter medications on file as of 09/18/2021.    Allergies (verified) Patient has no known allergies.   History: Past Medical History:  Diagnosis Date   Anemia    Anxiety    Chronotropic incompetence 12/2012    potentially medication related; noted on CPET    DDD (degenerative disc disease)    Eczema    GERD (gastroesophageal reflux disease)    H/O hiatal hernia    Hx: UTI (urinary tract infection)    Hyperlipidemia    Hypertension    Hypothyroidism    Osteopenia    Septicemia ([place]) 2002   following UTI   Vertigo, benign positional    Past Surgical History:  Procedure Laterality Date   BREAST SURGERY     left biopsy   CATARACT EXTRACTION Right    2 weeks ago   CPET / MET - PFTS      Consistent with chronotropic incompetence; See attached report in the results section   DILATION AND CURETTAGE OF UTERUS     x 2   DOPPLER ECHOCARDIOGRAPHY  01/10/2013   Normal LV size and function. Normal EF. Air sclerosis but no stenosis   ESOPHAGOGASTRODUODENOSCOPY  05/04/2012   Procedure: ESOPHAGOGASTRODUODENOSCOPY (EGD);  Surgeon: Inda Castle, MD;  Location: [facility];  Service: Endoscopy;  Laterality: N/A;   EYE SURGERY     cataract extraction with ILO  ? eye   IR KYPHO EA ADDL LEVEL THORACIC OR LUMBAR  10/28/2016   IR KYPHO LUMBAR INC FX REDUCE BONE BX UNI/BIL CANNULATION INC/IMAGING  10/28/2016   IR KYPHO THORACIC WITH BONE BIOPSY  12/24/2016   IR RADIOLOGIST EVAL & MGMT  11/11/2016  KNEE ARTHROSCOPY  2011   right   ORIF ANKLE FRACTURE Right 03/27/2021   Procedure: OPEN REDUCTION INTERNAL FIXATION (ORIF) ANKLE FRACTURE;  Surgeon: Marchia Bond, MD;  Location: [facility];  Service: Orthopedics;  Laterality: Right;   TONSILLECTOMY     TOTAL KNEE ARTHROPLASTY  04/24/2012   Procedure: TOTAL KNEE ARTHROPLASTY;  Surgeon: Gearlean Alf, MD;  Location: [facility];  Service: Orthopedics;  Laterality: Right;   TRANSTHORACIC ECHOCARDIOGRAM  02/2018   Ordered by PCP for "congestive heart failure " -EF 60 M 65%.  GR 1 DD.  No R WMA--- > ESSENTIALLY NORMAL   Family History  Problem Relation Age of Onset   Lung cancer Father 34       Died at age 46   Stroke Mother 39       Died in her late 3s.   Heart attack Mother 55   Diabetes Mother    Arthritis Mother    Social History   Socioeconomic History   Marital status: Widowed    Spouse name: [name]   Number of children: 3   Years of education: Not on file   Highest education level: Not on file  Occupational History   Occupation: homemaker  Tobacco Use   Smoking status: Former    Years: 0.00    Types: Cigarettes    Quit date: 05/03/1965    Years since quitting: 56.4   Smokeless tobacco: Never  Vaping Use   Vaping Use: Never used   Substance and Sexual Activity   Alcohol use: No    Comment: occ glass wine   Drug use: No   Sexual activity: Not on file  Other Topics Concern   Not on file  Social History Narrative   She is a widowed mother of 3.   She is a former smoker, with a distant history having quit in 1966.   She does drink an occasional glass of red wine.   She is just now started to try doing exercise with water aerobics.   Social Determinants of Health   Financial Resource Strain: Low Risk    Difficulty of Paying Living Expenses: Not hard at all  Food Insecurity: No Food Insecurity   Worried About Charity fundraiser in the Last Year: Never true   [place] in the Last Year: Never true  Transportation Needs: No Transportation Needs   Lack of Transportation (Medical): No   Lack of Transportation (Non-Medical): No  Physical Activity: Insufficiently Active   Days of Exercise per Week: 1 day   Minutes of Exercise per Session: 20 min  Stress: No Stress Concern Present   Feeling of Stress : Only a little  Social Connections: Socially Isolated   Frequency of Communication with Friends and Family: More than three times a week   Frequency of Social Gatherings with Friends and Family: Once a week   Attends Religious Services: Never   Marine scientist or Organizations: No   Attends Archivist Meetings: Never   Marital Status: Widowed    Tobacco Counseling Counseling given: Not Answered   Clinical Intake:  Pre-visit preparation completed: Yes  Pain : No/denies pain     BMI - recorded: 28.97 Nutritional Status: BMI 25 -29 Overweight Nutritional Risks: None Diabetes: No  How often do you need to have someone help you when you read instructions, pamphlets, or other written materials from your doctor or pharmacy?: 3 - Sometimes What is the last grade level you  completed in school?: 1 years college  Diabetic?No  Interpreter Needed?: No      Activities of Daily  Living In your present state of health, do you have any difficulty performing the following activities: 09/18/2021 03/26/2021  Hearing? N -  Vision? N -  Difficulty concentrating or making decisions? Y -  Walking or climbing stairs? Y -  Comment - -  Dressing or bathing? Y -  Doing errands, shopping? [place]pie Donning  Preparing Food and eating ? Y -  Using the Toilet? Y -  In the past six months, have you accidently leaked urine? Y -  Do you have problems with loss of bowel control? N -  Managing your Medications? Y -  Managing your Finances? Y -  Housekeeping or managing your Housekeeping? Y -  Some recent data might be hidden    Patient Care Team: Virgie Dad, MD as PCP - General (Internal Medicine) Mast, Man X, NP as Nurse Practitioner (Internal Medicine)  Indicate any recent Medical Services you may have received from other than [facility] providers in the past year (date may be approximate).     Assessment:   This is a routine wellness examination for Alexza.  Hearing/Vision screen No results found.  Dietary issues and exercise activities discussed: Current Exercise Habits: The patient does not participate in regular exercise at present, Exercise limited by: cardiac condition(s);orthopedic condition(s)   Goals Addressed             This Visit's Progress    Absence of Fall and Fall-Related Injury   Not on track    Evidence-based guidance:  Assess fall risk using a validated tool when available. Consider balance and gait impairment, muscle weakness, diminished vision or hearing, environmental hazards, presence of urinary or bowel urgency and/or incontinence.  Communicate fall injury risk to interprofessional healthcare team.  Develop a fall prevention plan with the patient and family.  Promote use of personal vision and auditory aids.  Promote reorientation, appropriate sensory stimulation, and routines to decrease risk of fall when changes in mental status are present.  Assess  assistance level required for safe and effective self-care; consider referral for home care.  Encourage physical activity, such as performance of self-care at highest level of ability, strength and balance exercise program, and provision of appropriate assistive devices; refer to rehabilitation therapy.  Refer to community-based fall prevention program where available.  If fall occurs, determine the cause and revise fall injury prevention plan.  Regularly review medication contribution to fall risk; consider risk related to polypharmacy and age.  Refer to pharmacist for consultation when concerns about medications are revealed.  Balance adequate pain management with potential for oversedation.  Provide guidance related to environmental modifications.  Consider supplementation with Vitamin D.   Notes:        Depression Screen PHQ 2/9 Scores 09/18/2021 05/01/2018 10/28/2017 09/14/2017 08/01/2017 06/29/2017  PHQ - 2 Score '1 6 1 4 6 2  ' PHQ- 9 Score - 14 - '14 11 9    ' Fall Risk Fall Risk  09/18/2021 10/18/2018 08/23/2018 05/09/2018 05/01/2018  Falls in the past year? 1 0 0 Yes Yes  Comment - - - - [facility]2019 [facility]  Number falls in past yr: 1 0 0 1 1  Injury with Fall? 1 0 0 No No  Risk for fall due to : History of fall(s);Impaired balance/gait;Impaired mobility;Orthopedic patient - - - Impaired balance/gait  Follow up Falls evaluation completed;Education provided;Falls prevention discussed - - -  Falls prevention discussed    FALL RISK PREVENTION PERTAINING TO THE HOME:  Any stairs in or around the home? No  If so, are there any without handrails? No  Home free of loose throw rugs in walkways, pet beds, electrical cords, etc? Yes  Adequate lighting in your home to reduce risk of falls? Yes   ASSISTIVE DEVICES UTILIZED TO PREVENT FALLS:  Life alert? No  Use of a cane, walker or w/c? Yes  Grab bars in the bathroom? Yes  Shower chair or bench in shower? Yes  Elevated  toilet seat or a handicapped toilet? Yes   TIMED UP AND GO:  Was the test performed? No .  Length of time to ambulate 10 feet: N/A sec.   Gait unsteady without use of assistive device, provider informed and interventions were implemented  Cognitive Function: MMSE - Mini Mental State Exam 09/18/2021 09/14/2017 06/29/2017  Not completed: Refused (No Data) -  Orientation to time - 5 5  Orientation to Place - 5 5  Registration - 3 3  Attention/ Calculation - 5 5  Recall - 3 3  Language- name 2 objects - 2 2  Language- repeat - 1 1  Language- follow 3 step command - 3 3  Language- read & follow direction - 1 1  Write a sentence - 1 1  Copy design - 1 1  Total score - 30 30     6CIT Screen 09/18/2021  What Year? 0 points  What month? 0 points  What time? 3 points  Count back from 20 2 points  Months in reverse 2 points  Repeat phrase 0 points  Total Score 7    Immunizations Immunization History  Administered Date(s) Administered   Influenza, High Dose Seasonal PF 04/26/2016, 03/22/2017, 05/08/2019   Influenza,inj,Quad PF,6+ Mos 04/27/2018   Influenza-Unspecified 04/22/2011, 04/04/2012, 05/03/2013, 05/04/2013, 04/17/2014, 04/19/2015, 04/26/2016, 04/22/2017, 05/20/2021   Moderna SARS-COV2 Booster Vaccination 06/09/2020, 12/31/2020   Moderna Sars-Covid-2 Vaccination 07/30/2019, 08/27/2019   Pfizer Covid-19 Vaccine Bivalent Booster 72yr & up 04/15/2021   Pneumococcal Polysaccharide-23 08/26/2003, 09/06/2011, 05/15/2019   Pneumococcal-Unspecified 12/20/2013   Td 09/26/2006   Tdap 05/15/2019   Zoster Recombinat (Shingrix) 11/21/2017, 04/08/2018   Zoster, Live 10/25/2007    TDAP status: Up to date  Flu Vaccine status: Up to date  Pneumococcal vaccine status: Up to date  Covid-19 vaccine status: Completed vaccines  Qualifies for Shingles Vaccine? Yes   Zostavax completed Yes   Shingrix Completed?: Yes  Screening Tests Health Maintenance  Topic Date Due   Pneumonia  Vaccine 88 Years old (2 - PCV) 05/14/2020   COVID-19 Vaccine (4 - Booster) 06/10/2021   TETANUS/TDAP  05/14/2029   INFLUENZA VACCINE  Completed   DEXA SCAN  Completed   Zoster Vaccines- Shingrix  Completed   HPV VACCINES  Aged Out    Health Maintenance  Health Maintenance Due  Topic Date Due   Pneumonia Vaccine 88 Years old (2 - PCV) 05/14/2020   COVID-19 Vaccine (4 - Booster) 06/10/2021    Colorectal cancer screening: No longer required.   Mammogram status: No longer required due to advanced age.  Bone Density status: Completed 2017. Results reflect: Bone density results: OSTEOPOROSIS. Repeat every 2- no future studies per patient years.  Lung Cancer Screening: (Low Dose CT Chest recommended if Age 86-80years, 30 pack-year currently smoking OR have quit w/in 15years.) does not qualify.   Lung Cancer Screening Referral: No  Additional Screening:  Hepatitis C Screening: does not qualify;  advanced age  Vision Screening: Recommended annual ophthalmology exams for early detection of glaucoma and other disorders of the eye. Is the patient up to date with their annual eye exam?  No  Who is the provider or what is the name of the office in which the patient attends annual eye exams? Orders placed for in-house eye exam If pt is not established with a provider, would they like to be referred to a provider to establish care? No .   Dental Screening: Recommended annual dental exams for proper oral hygiene  Community Resource Referral / Chronic Care Management: CRR required this visit?  No   CCM required this visit?  No      Plan:     I have personally reviewed and noted the following in the patients chart:   Medical and social history Use of alcohol, tobacco or illicit drugs  Current medications and supplements including opioid prescriptions.  Functional ability and status Nutritional status Physical activity Advanced directives List of other  physicians Hospitalizations, surgeries, and ER visits in previous 12 months Vitals Screenings to include cognitive, depression, and falls Referrals and appointments  In addition, I have reviewed and discussed with patient certain preventive protocols, quality metrics, and best practice recommendations. A written personalized care plan for preventive services as well as general preventive health recommendations were provided to patient.     Yvonna Alanis, NP   09/18/2021

## 2021-09-21 DIAGNOSIS — S82851D Displaced trimalleolar fracture of right lower leg, subsequent encounter for closed fracture with routine healing: Secondary | ICD-10-CM | POA: Diagnosis not present

## 2021-09-21 DIAGNOSIS — Z9181 History of falling: Secondary | ICD-10-CM | POA: Diagnosis not present

## 2021-09-21 DIAGNOSIS — M6281 Muscle weakness (generalized): Secondary | ICD-10-CM | POA: Diagnosis not present

## 2021-09-23 ENCOUNTER — Encounter: Payer: Self-pay | Admitting: Orthopedic Surgery

## 2021-09-23 ENCOUNTER — Non-Acute Institutional Stay (SKILLED_NURSING_FACILITY): Payer: Medicare Other | Admitting: Orthopedic Surgery

## 2021-09-23 DIAGNOSIS — R600 Localized edema: Secondary | ICD-10-CM

## 2021-09-23 DIAGNOSIS — G4700 Insomnia, unspecified: Secondary | ICD-10-CM | POA: Diagnosis not present

## 2021-09-23 DIAGNOSIS — Z9181 History of falling: Secondary | ICD-10-CM | POA: Diagnosis not present

## 2021-09-23 DIAGNOSIS — I10 Essential (primary) hypertension: Secondary | ICD-10-CM

## 2021-09-23 DIAGNOSIS — R443 Hallucinations, unspecified: Secondary | ICD-10-CM | POA: Diagnosis not present

## 2021-09-23 DIAGNOSIS — R0602 Shortness of breath: Secondary | ICD-10-CM | POA: Diagnosis not present

## 2021-09-23 DIAGNOSIS — M6281 Muscle weakness (generalized): Secondary | ICD-10-CM | POA: Diagnosis not present

## 2021-09-23 DIAGNOSIS — R413 Other amnesia: Secondary | ICD-10-CM | POA: Diagnosis not present

## 2021-09-23 DIAGNOSIS — F418 Other specified anxiety disorders: Secondary | ICD-10-CM

## 2021-09-23 DIAGNOSIS — M545 Low back pain, unspecified: Secondary | ICD-10-CM | POA: Diagnosis not present

## 2021-09-23 DIAGNOSIS — R2681 Unsteadiness on feet: Secondary | ICD-10-CM | POA: Diagnosis not present

## 2021-09-23 NOTE — Progress Notes (Signed)
Location:  [facility] Room Number: N35/A Place of Service:  SNF (31) Provider: Yvonna Alanis, NP  Patient Care Team: Virgie Dad, MD as PCP - General (Internal Medicine) Mast, Man X, NP as Nurse Practitioner (Internal Medicine)  Extended Emergency Contact Information Primary Emergency Contact: Travaglini,[name] Address: [address]          [place], [place] [address] [place] Phone: [phone] Mobile Phone: [phone] Relation: Son Secondary Emergency Contact: [name] [place] Phone: [phone] Mobile Phone: [phone] Relation: Other  Code Status:  DNR Goals of care: Advanced Directive information Advanced Directives 09/23/2021  Does Patient Have a Medical Advance Directive? Yes  Type of [facility] of [place];Out of facility DNR (pink MOST or yellow form)  Does patient want to make changes to medical advance directive? No - Patient declined  Copy of [place] in Chart? Yes - validated most recent copy scanned in chart (See row information)  Pre-existing out of facility DNR order (yellow form or pink MOST form) -     Chief Complaint  Patient presents with   Acute Visit    Shortness of breath     HPI:  Pt is a 88 y.o. female seen today for an acute visit for shortness of breath.   This morning she was working with PT and O2 sats dropped to 70's while laying flat. Sats improved to 88% after sitting up. She was placed on 2 liters oxygen and sats improved > 90%. She admits to increased shortness of breath during encounter. Denies chest pain or cold symptoms. In addition, she has had 2 episodes of hallucinations in the past week. She called 911 last week because she thought someone was in her closet.   HTN- BUN/creat 21/1.1 07/10/2021, remains on losartan, NAS diet BLE- non pitting, wears teds, elevates legs in bed, remains on torsemide daily Insomnia- reports she is  not sleeping well in the past week- having trouble falling asleep, averaging a few hours a night Impaired memory- BIMS score 15 06/30/2021, see above  Depression with anxiety- no mood changes, remains on Wellbutrin, Zoloft and xanax prn    Past Medical History:  Diagnosis Date   Anemia    Anxiety    Chronotropic incompetence 12/2012    potentially medication related; noted on CPET    DDD (degenerative disc disease)    Eczema    GERD (gastroesophageal reflux disease)    H/O hiatal hernia    Hx: UTI (urinary tract infection)    Hyperlipidemia    Hypertension    Hypothyroidism    Osteopenia    Septicemia ([place]) 2002   following UTI   Vertigo, benign positional    Past Surgical History:  Procedure Laterality Date   BREAST SURGERY     left biopsy   CATARACT EXTRACTION Right    2 weeks ago   CPET / MET - PFTS     Consistent with chronotropic incompetence; See attached report in the results section   DILATION AND CURETTAGE OF UTERUS     x 2   DOPPLER ECHOCARDIOGRAPHY  01/10/2013   Normal LV size and function. Normal EF. Air sclerosis but no stenosis   ESOPHAGOGASTRODUODENOSCOPY  05/04/2012   Procedure: ESOPHAGOGASTRODUODENOSCOPY (EGD);  Surgeon: Inda Castle, MD;  Location: [facility];  Service: Endoscopy;  Laterality: N/A;   EYE SURGERY     cataract extraction with ILO  ? eye  IR KYPHO EA ADDL LEVEL THORACIC OR LUMBAR  10/28/2016   IR KYPHO LUMBAR INC FX REDUCE BONE BX UNI/BIL CANNULATION INC/IMAGING  10/28/2016   IR KYPHO THORACIC WITH BONE BIOPSY  12/24/2016   IR RADIOLOGIST EVAL & MGMT  11/11/2016   KNEE ARTHROSCOPY  2011   right   ORIF ANKLE FRACTURE Right 03/27/2021   Procedure: OPEN REDUCTION INTERNAL FIXATION (ORIF) ANKLE FRACTURE;  Surgeon: Marchia Bond, MD;  Location: [facility];  Service: Orthopedics;  Laterality: Right;   TONSILLECTOMY     TOTAL KNEE ARTHROPLASTY  04/24/2012   Procedure: TOTAL KNEE ARTHROPLASTY;  Surgeon: Gearlean Alf, MD;  Location: [facility];   Service: Orthopedics;  Laterality: Right;   TRANSTHORACIC ECHOCARDIOGRAM  02/2018   Ordered by PCP for "congestive heart failure " -EF 60 M 65%.  GR 1 DD.  No R WMA--- > ESSENTIALLY NORMAL    No Known Allergies  Outpatient Encounter Medications as of 09/23/2021  Medication Sig   acetaminophen (TYLENOL) 500 MG tablet Take 1,000 mg by mouth every 8 (eight) hours. For pain   Albuterol Sulfate 2.5 MG/0.5ML NEBU Inhale into the lungs every 6 (six) hours as needed (As needed for wheezing or shortness of breath).   ALPRAZolam (XANAX) 0.25 MG tablet Take 1 tablet (0.25 mg total) by mouth daily.   atorvastatin (LIPITOR) 10 MG tablet Take 5 mg by mouth at bedtime.   buPROPion (WELLBUTRIN SR) 150 MG 12 hr tablet Take 1 tablet (150 mg total) by mouth 2 (two) times daily after a meal.   calcium citrate-vitamin D (CITRACAL+D) 315-200 MG-UNIT tablet Take 1 tablet by mouth 2 (two) times daily.   Camphor-Menthol-Methyl Sal 3.07-31-08 % PTCH Place 1 patch onto the skin See admin instructions. Apply one patch onto the skin of the  lower back daily for back pain   Carboxymethylcellulose Sodium (ARTIFICIAL TEARS OP) Place 1 drop into both eyes in the morning, at noon, and at bedtime.   diclofenac Sodium (VOLTAREN) 1 % GEL Apply 4 g topically in the morning and at bedtime. Apply to bilateral ankles BID   ferrous sulfate 325 (65 FE) MG EC tablet Take 325 mg by mouth every Monday, Wednesday, and Friday. With a meal   fish oil-omega-3 fatty acids 1000 MG capsule Take 1 g by mouth every morning.   gabapentin (NEURONTIN) 100 MG capsule Take 100 mg by mouth 3 (three) times daily.   hydrocortisone cream 1 % Apply 1 application topically daily as needed for itching.   levothyroxine (SYNTHROID) 75 MCG tablet Take 75 mcg by mouth every morning.   losartan (COZAAR) 25 MG tablet Take 25 mg by mouth every morning.   melatonin 5 MG TABS Take 1 tablet (5 mg total) by mouth at bedtime.   Menthol, Topical Analgesic, (BIOFREEZE) 4 %  GEL Apply topically 2 (two) times daily as needed. Apply thin layer to left thigh   Nutritional Supplements (ARGINAID EXTRA) LIQD Take 8 oz by mouth daily.   nystatin (MYCOSTATIN/NYSTOP) powder Apply 1 application topically 2 (two) times daily as needed (yeast).   omeprazole (PRILOSEC) 40 MG capsule Take 40 mg by mouth every morning.   OxyCODONE HCl, Abuse Deter, (OXAYDO) 5 MG TABA Take 2.5 mg by mouth 2 (two) times daily as needed. Give 30 minutes before transportation to ortho appointments.   polyethylene glycol (MIRALAX / GLYCOLAX) 17 g packet Take 17 g by mouth daily as needed (constipation).   potassium chloride SA (KLOR-CON M) 20 MEQ tablet Take 20 mEq  by mouth daily.   senna (SENOKOT) 8.6 MG TABS tablet Take 2 tablets by mouth at bedtime.   sertraline (ZOLOFT) 25 MG tablet Take 75 mg by mouth every morning.   torsemide (DEMADEX) 20 MG tablet Take 20 mg by mouth every other day.   zinc oxide 20 % ointment Apply 1 application topically See admin instructions. Apply topically to per/buttocks after every incontinent episode and as needed for redness   [DISCONTINUED] potassium chloride SA (KLOR-CON M) 20 MEQ tablet Take 20 mEq by mouth in the morning.   No facility-administered encounter medications on file as of 09/23/2021.    Review of Systems  Constitutional:  Negative for activity change, appetite change, chills, fatigue and fever.  HENT:  Negative for congestion, sore throat and trouble swallowing.   Eyes:  Negative for visual disturbance.  Respiratory:  Positive for shortness of breath and wheezing. Negative for cough.   Cardiovascular:  Positive for leg swelling. Negative for chest pain.  Gastrointestinal:  Negative for abdominal distention, abdominal pain, constipation, diarrhea and nausea.  Genitourinary:  Negative for dysuria, frequency and hematuria.  Musculoskeletal:  Positive for arthralgias and gait problem.  Skin:  Negative for wound.  Neurological:  Positive for weakness.  Negative for dizziness and headaches.  Psychiatric/Behavioral:  Positive for confusion, dysphoric mood, hallucinations and sleep disturbance. The patient is nervous/anxious.    Immunization History  Administered Date(s) Administered   Influenza, High Dose Seasonal PF 04/26/2016, 03/22/2017, 05/08/2019   Influenza,inj,Quad PF,6+ Mos 04/27/2018   Influenza-Unspecified 04/22/2011, 04/04/2012, 05/03/2013, 05/04/2013, 04/17/2014, 04/19/2015, 04/26/2016, 04/22/2017, 05/20/2021   Moderna SARS-COV2 Booster Vaccination 06/09/2020, 12/31/2020   Moderna Sars-Covid-2 Vaccination 07/30/2019, 08/27/2019   Pfizer Covid-19 Vaccine Bivalent Booster 70yr & up 04/15/2021   Pneumococcal Polysaccharide-23 08/26/2003, 09/06/2011, 05/15/2019   Pneumococcal-Unspecified 12/20/2013   Td 09/26/2006   Tdap 05/15/2019   Zoster Recombinat (Shingrix) 11/21/2017, 04/08/2018   Zoster, Live 10/25/2007   Pertinent  Health Maintenance Due  Topic Date Due   INFLUENZA VACCINE  Completed   DEXA SCAN  Completed   Fall Risk 03/31/2021 03/31/2021 03/31/2021 04/04/2021 09/18/2021  Falls in the past year? - - - - 1  Was there an injury with Fall? - - - - 1  Fall Risk Category Calculator - - - - 3  Fall Risk Category - - - - High  Patient Fall Risk Level _0   Patient at Risk for Falls Due to - - - - History of fall(s);Impaired balance/gait;Impaired mobility;Orthopedic patient  Fall risk Follow up - - - - Falls evaluation completed;Education provided;Falls prevention discussed   Functional Status Survey:    Vitals:   09/23/21 1131  BP: (!) 106/58  Pulse: 80  Resp: (!) 23  Temp: (!) 97.3 F (36.3 C)  SpO2: 92%  Weight: 168 lb 12.8 oz (76.6 kg)  Height: _1  (1.626 m)   Body mass index is 28.97 kg/m. Physical Exam Vitals reviewed.  Constitutional:      General: She is not in acute distress. HENT:     Head: Normocephalic.     Nose: Nose normal.      Mouth/Throat:     Mouth: Mucous membranes are moist.  Eyes:     General:        Right eye: No discharge.        Left eye: No discharge.  Neck:     Vascular: No carotid bruit.  Cardiovascular:  Rate and Rhythm: Normal rate and regular rhythm.     Pulses: Normal pulses.     Heart sounds: Normal heart sounds.  Pulmonary:     Effort: Pulmonary effort is normal. No respiratory distress.     Breath sounds: Examination of the right-upper field reveals wheezing. Examination of the left-upper field reveals wheezing. Examination of the right-middle field reveals rales. Examination of the left-middle field reveals rales. Wheezing and rales present.     Comments: Expiratory wheezing Abdominal:     General: Bowel sounds are normal. There is no distension.     Palpations: Abdomen is soft.     Tenderness: There is no abdominal tenderness.  Musculoskeletal:     Cervical back: Neck supple.     Right lower leg: Edema present.     Left lower leg: Edema present.     Comments: Non pitting  Lymphadenopathy:     Cervical: No cervical adenopathy.  Skin:    General: Skin is warm and dry.     Capillary Refill: Capillary refill takes less than 2 seconds.  Neurological:     General: No focal deficit present.     Mental Status: She is alert and oriented to person, place, and time.     Motor: Weakness present.     Gait: Gait abnormal.  Psychiatric:        Mood and Affect: Mood normal.        Behavior: Behavior normal.        Cognition and Memory: Memory is impaired.     Comments: Very pleasant, answers appropriately, alert to self/person/place    Labs reviewed: Recent Labs    03/28/21 0547 03/30/21 0503 04/04/21 1827 07/10/21 0000 09/17/21 0000  NA 137 137 145 138 138  K 4.0 3.5 4.5 4.3 4.2  CL 104 100 103 103 106  CO2 _0 27* 27*  GLUCOSE 117* 101* 104*  --   --   BUN 22 36* 27* 21 20  CREATININE 1.00 1.31* 0.98 1.1 1.0  CALCIUM 9.2 8.9 9.7 9.6 9.3   Recent Labs     03/27/21 0543 04/04/21 1827 07/10/21 0000  AST 21 37 22  ALT _1 ALKPHOS 69 95 91  BILITOT 0.4 0.5  --   PROT 7.4 7.9  --   ALBUMIN 3.7 3.2* 3.8   Recent Labs    03/30/21 0503 03/31/21 0444 04/03/21 0000 04/04/21 1827 07/10/21 0000 08/07/21 0000 09/17/21 0000  WBC 10.7* 9.3   < > 10.3 5.7 9.2 5.1  NEUTROABS  --   --    < > 8.0* 3,671.00 6,771.00 2,601.00  HGB 7.6* 8.1*   < > 8.2* 9.4* 9.1* 9.1*  HCT 24.6* 26.1*   < > 27.8* 29* 29* 31*  MCV 83.4 82.9  --  85.3  --   --   --   PLT 83* 97*   < > 226 95* 131* 109*   < > = values in this interval not displayed.   Lab Results  Component Value Date   TSH 3.49 09/17/2021   No results found for: HGBA1C Lab Results  Component Value Date   CHOL 165 10/20/2020   HDL 44 10/20/2020   LDLCALC 94 10/20/2020   TRIG 168 (A) 10/20/2020   CHOLHDL 3.8 11/23/2018    Significant Diagnostic Results in last 30 days:  No results found.  Assessment/Plan 1. Shortness of breath - expiratory wheezing and slight rales noted on exam - on torsemide  -  cont oxygen prn - CXR - start albuterol nebulizer q6hrs prn  2. Hallucinations - appropriate today - cbc/diff - cmp  3. Essential hypertension - controlled - cont losartan  4. Bilateral leg edema - non pitting  - cont torsemide   5. Insomnia, unspecified type - recently started on melatonin  6. Memory impairment - more forgetful  - not sleeping well- ? anxiety  7. Depression with anxiety - tearful at times - cont Zoloft and xanax    Family/ staff Communication: plan discussed with patient and nurse  Labs/tests ordered:  cbc/diff, cmp, CXR

## 2021-09-24 ENCOUNTER — Non-Acute Institutional Stay (SKILLED_NURSING_FACILITY): Payer: Medicare Other | Admitting: Internal Medicine

## 2021-09-24 ENCOUNTER — Encounter: Payer: Self-pay | Admitting: Internal Medicine

## 2021-09-24 DIAGNOSIS — M545 Low back pain, unspecified: Secondary | ICD-10-CM

## 2021-09-24 DIAGNOSIS — E039 Hypothyroidism, unspecified: Secondary | ICD-10-CM | POA: Diagnosis not present

## 2021-09-24 DIAGNOSIS — L89626 Pressure-induced deep tissue damage of left heel: Secondary | ICD-10-CM

## 2021-09-24 DIAGNOSIS — G8929 Other chronic pain: Secondary | ICD-10-CM

## 2021-09-24 DIAGNOSIS — N1831 Chronic kidney disease, stage 3a: Secondary | ICD-10-CM

## 2021-09-24 DIAGNOSIS — I119 Hypertensive heart disease without heart failure: Secondary | ICD-10-CM | POA: Diagnosis not present

## 2021-09-24 DIAGNOSIS — I517 Cardiomegaly: Secondary | ICD-10-CM | POA: Diagnosis not present

## 2021-09-24 DIAGNOSIS — D61818 Other pancytopenia: Secondary | ICD-10-CM | POA: Diagnosis not present

## 2021-09-24 DIAGNOSIS — R918 Other nonspecific abnormal finding of lung field: Secondary | ICD-10-CM

## 2021-09-24 DIAGNOSIS — I10 Essential (primary) hypertension: Secondary | ICD-10-CM | POA: Diagnosis not present

## 2021-09-24 DIAGNOSIS — S82851D Displaced trimalleolar fracture of right lower leg, subsequent encounter for closed fracture with routine healing: Secondary | ICD-10-CM

## 2021-09-24 DIAGNOSIS — R0602 Shortness of breath: Secondary | ICD-10-CM | POA: Diagnosis not present

## 2021-09-24 DIAGNOSIS — F418 Other specified anxiety disorders: Secondary | ICD-10-CM

## 2021-09-24 DIAGNOSIS — I509 Heart failure, unspecified: Secondary | ICD-10-CM | POA: Diagnosis not present

## 2021-09-24 LAB — COMPREHENSIVE METABOLIC PANEL
Albumin: 3.5 (ref 3.5–5.0)
Calcium: 9.3 (ref 8.7–10.7)
Globulin: 3
eGFR: 44

## 2021-09-24 LAB — BASIC METABOLIC PANEL
BUN: 26 — AB (ref 4–21)
CO2: 25 — AB (ref 13–22)
Chloride: 103 (ref 99–108)
Creatinine: 1.2 — AB (ref 0.5–1.1)
Glucose: 93
Potassium: 4.3 mEq/L (ref 3.5–5.1)
Sodium: 138 (ref 137–147)

## 2021-09-24 LAB — CBC AND DIFFERENTIAL
HCT: 28 — AB (ref 36–46)
Hemoglobin: 8.3 — AB (ref 12.0–16.0)
Neutrophils Absolute: 10206
Platelets: 21 10*3/uL — AB (ref 150–400)
WBC: 12.6

## 2021-09-24 LAB — HEPATIC FUNCTION PANEL
ALT: 10 U/L (ref 7–35)
AST: 18 (ref 13–35)
Alkaline Phosphatase: 86 (ref 25–125)
Bilirubin, Total: 0.3

## 2021-09-24 LAB — CBC: RBC: 3.08 — AB (ref 3.87–5.11)

## 2021-09-24 NOTE — Progress Notes (Signed)
Location:   [facility] Room Number: [id number] Place of Service:  SNF [phone]) Provider:  Veleta Miners MD   Virgie Dad, MD  Patient Care Team: Virgie Dad, MD as PCP - General (Internal Medicine) Mast, Man X, NP as Nurse Practitioner (Internal Medicine)  Extended Emergency Contact Information Primary Emergency Contact: Wilhoite,[name] Address: [address]          [place], [place] [address] [place] Phone: [phone] Mobile Phone: [phone] Relation: Son Secondary Emergency Contact: Scarfone, Kathi Der [place] Phone: [phone] Mobile Phone: [phone] Relation: Other  Code Status:  DNR Managed Care Goals of care: Advanced Directive information Advanced Directives 09/24/2021  Does Patient Have a Medical Advance Directive? Yes  Type of Paramedic of [place];Out of facility DNR (pink MOST or yellow form)  Does patient want to make changes to medical advance directive? No - Patient declined  Copy of [place] in Chart? Yes - validated most recent copy scanned in chart (See row information)  Pre-existing out of facility DNR order (yellow form or pink MOST form) -     Chief Complaint  Patient presents with   Acute Visit    HPI:  Pt is a 88 y.o. female seen today for an acute visit for Cough SOB and Hallucinations    Patient has h/o Pancytopenia , h/o Chronic Back Pain, Hypertension, Hypothyroidism, Hypertension, Recurrent Cystitis, Osteoporosis and Major Depression  Admitted in the hospital From 9/1-9/7 for Right Trimalleolar Fracture. S/P ORIF  Since then she has been in SNF Yesterday she was was working with therapy and her Sats were down to 70% But did improve when she sits to crest and went above 90% on 2 L She complained of some cough but no fever chest pain.  Patient also had noticed some hallucination over the past few days she said that they feel real and do not scare  her.  Patient is ambulatory and weightbearing with brace on her right leg.  Per therapy she is able to walk short distance with her walker and mild assist.  Has a very poor endurance. Past Medical History:  Diagnosis Date   Anemia    Anxiety    Chronotropic incompetence 12/2012    potentially medication related; noted on CPET    DDD (degenerative disc disease)    Eczema    GERD (gastroesophageal reflux disease)    H/O hiatal hernia    Hx: UTI (urinary tract infection)    Hyperlipidemia    Hypertension    Hypothyroidism    Osteopenia    Septicemia ([place]) 2002   following UTI   Vertigo, benign positional    Past Surgical History:  Procedure Laterality Date   BREAST SURGERY     left biopsy   CATARACT EXTRACTION Right    2 weeks ago   CPET / MET - PFTS     Consistent with chronotropic incompetence; See attached report in the results section   DILATION AND CURETTAGE OF UTERUS     x 2   DOPPLER ECHOCARDIOGRAPHY  01/10/2013   Normal LV size and function. Normal EF. Air sclerosis but no stenosis   ESOPHAGOGASTRODUODENOSCOPY  05/04/2012   Procedure: ESOPHAGOGASTRODUODENOSCOPY (EGD);  Surgeon: Inda Castle, MD;  Location: [facility];  Service: Endoscopy;  Laterality: N/A;   EYE SURGERY     cataract extraction with ILO  ? eye   IR KYPHO EA ADDL LEVEL THORACIC  OR LUMBAR  10/28/2016   IR KYPHO LUMBAR INC FX REDUCE BONE BX UNI/BIL CANNULATION INC/IMAGING  10/28/2016   IR KYPHO THORACIC WITH BONE BIOPSY  12/24/2016   IR RADIOLOGIST EVAL & MGMT  11/11/2016   KNEE ARTHROSCOPY  2011   right   ORIF ANKLE FRACTURE Right 03/27/2021   Procedure: OPEN REDUCTION INTERNAL FIXATION (ORIF) ANKLE FRACTURE;  Surgeon: Marchia Bond, MD;  Location: [facility];  Service: Orthopedics;  Laterality: Right;   TONSILLECTOMY     TOTAL KNEE ARTHROPLASTY  04/24/2012   Procedure: TOTAL KNEE ARTHROPLASTY;  Surgeon: Gearlean Alf, MD;  Location: [facility];  Service: Orthopedics;  Laterality: Right;   TRANSTHORACIC  ECHOCARDIOGRAM  02/2018   Ordered by PCP for "congestive heart failure " -EF 60 M 65%.  GR 1 DD.  No R WMA--- > ESSENTIALLY NORMAL    No Known Allergies  Allergies as of 09/24/2021   No Known Allergies      Medication List        Accurate as of September 24, 2021  1:34 PM. If you have any questions, ask your nurse or doctor.          acetaminophen 500 MG tablet Commonly known as: TYLENOL Take 1,000 mg by mouth every 8 (eight) hours. For pain   Albuterol Sulfate 2.5 MG/0.5ML Nebu Inhale into the lungs every 6 (six) hours as needed (As needed for wheezing or shortness of breath).   ALPRAZolam 0.25 MG tablet Commonly known as: XANAX Take 1 tablet (0.25 mg total) by mouth daily.   Arginaid Extra Liqd Take 8 oz by mouth daily.   ARTIFICIAL TEARS OP Place 1 drop into both eyes in the morning, at noon, and at bedtime.   atorvastatin 10 MG tablet Commonly known as: LIPITOR Take 5 mg by mouth at bedtime.   Biofreeze 4 % Gel Generic drug: Menthol (Topical Analgesic) Apply topically 2 (two) times daily as needed. Apply thin layer to left thigh   buPROPion 150 MG 12 hr tablet Commonly known as: WELLBUTRIN SR Take 1 tablet (150 mg total) by mouth 2 (two) times daily after a meal.   calcium citrate-vitamin D 315-200 MG-UNIT tablet Commonly known as: CITRACAL+D Take 1 tablet by mouth 2 (two) times daily.   Camphor-Menthol-Methyl Sal 3.07-31-08 % Ptch Place 1 patch onto the skin See admin instructions. Apply one patch onto the skin of the  lower back daily for back pain   diclofenac Sodium 1 % Gel Commonly known as: VOLTAREN Apply 4 g topically in the morning and at bedtime. Apply to bilateral ankles BID   ferrous sulfate 325 (65 FE) MG EC tablet Take 325 mg by mouth every Monday, Wednesday, and Friday. With a meal   fish oil-omega-3 fatty acids 1000 MG capsule Take 1 g by mouth every morning.   gabapentin 100 MG capsule Commonly known as: NEURONTIN Take 100 mg by mouth 3  (three) times daily.   hydrocortisone cream 1 % Apply 1 application topically daily as needed for itching.   levothyroxine 75 MCG tablet Commonly known as: SYNTHROID Take 75 mcg by mouth every morning.   losartan 25 MG tablet Commonly known as: COZAAR Take 25 mg by mouth every morning.   melatonin 5 MG Tabs Take 1 tablet (5 mg total) by mouth at bedtime.   nystatin powder Commonly known as: MYCOSTATIN/NYSTOP Apply 1 application topically 2 (two) times daily as needed (yeast).   omeprazole 40 MG capsule Commonly known as: PRILOSEC Take 40 mg  by mouth every morning.   OxyCODONE HCl (Abuse Deter) 5 MG Taba Commonly known as: OXAYDO Take 2.5 mg by mouth 2 (two) times daily as needed. Give 30 minutes before transportation to ortho appointments.   polyethylene glycol 17 g packet Commonly known as: MIRALAX / GLYCOLAX Take 17 g by mouth daily as needed (constipation).   potassium chloride SA 20 MEQ tablet Commonly known as: KLOR-CON M Take 20 mEq by mouth daily.   senna 8.6 MG Tabs tablet Commonly known as: SENOKOT Take 2 tablets by mouth at bedtime.   sertraline 25 MG tablet Commonly known as: ZOLOFT Take 75 mg by mouth every morning.   torsemide 20 MG tablet Commonly known as: DEMADEX Take 20 mg by mouth every other day.   zinc oxide 20 % ointment Apply 1 application topically See admin instructions. Apply topically to per/buttocks after every incontinent episode and as needed for redness        Review of Systems  Constitutional:  Negative for activity change and appetite change.  HENT: Negative.    Respiratory:  Positive for cough and shortness of breath.   Cardiovascular:  Negative for leg swelling.  Gastrointestinal:  Negative for constipation.  Genitourinary: Negative.   Musculoskeletal:  Positive for arthralgias, gait problem and myalgias.  Skin:  Positive for wound.  Neurological:  Positive for weakness. Negative for dizziness.  Psychiatric/Behavioral:   Positive for hallucinations and sleep disturbance. Negative for confusion and dysphoric mood.    Immunization History  Administered Date(s) Administered   Influenza, High Dose Seasonal PF 04/26/2016, 03/22/2017, 05/08/2019   Influenza,inj,Quad PF,6+ Mos 04/27/2018   Influenza-Unspecified 04/22/2011, 04/04/2012, 05/03/2013, 05/04/2013, 04/17/2014, 04/19/2015, 04/26/2016, 04/22/2017, 05/20/2021   Moderna SARS-COV2 Booster Vaccination 06/09/2020, 12/31/2020   Moderna Sars-Covid-2 Vaccination 07/30/2019, 08/27/2019   Pfizer Covid-19 Vaccine Bivalent Booster 33yr & up 04/15/2021   Pneumococcal Polysaccharide-23 08/26/2003, 09/06/2011, 05/15/2019   Pneumococcal-Unspecified 12/20/2013   Td 09/26/2006   Tdap 05/15/2019   Zoster Recombinat (Shingrix) 11/21/2017, 04/08/2018   Zoster, Live 10/25/2007   Pertinent  Health Maintenance Due  Topic Date Due   INFLUENZA VACCINE  Completed   DEXA SCAN  Completed   Fall Risk 03/31/2021 03/31/2021 03/31/2021 04/04/2021 09/18/2021  Falls in the past year? - - - - 1  Was there an injury with Fall? - - - - 1  Fall Risk Category Calculator - - - - 3  Fall Risk Category - - - - High  Patient Fall Risk Level High fall risk High fall risk High fall risk High fall risk High fall risk  Patient at Risk for Falls Due to - - - - History of fall(s);Impaired balance/gait;Impaired mobility;Orthopedic patient  Fall risk Follow up - - - - Falls evaluation completed;Education provided;Falls prevention discussed   Functional Status Survey:    Vitals:   09/24/21 1313  BP: (!) 106/58  Pulse: 80  Resp: (!) 23  Temp: (!) 97.4 F (36.3 C)  SpO2: 94%  Weight: 169 lb 8 oz (76.9 kg)  Height: '5\' 4"'  (1.626 m)   Body mass index is 29.09 kg/m. Physical Exam Vitals reviewed.  Constitutional:      Appearance: Normal appearance.  HENT:     Head: Normocephalic.     Nose: Nose normal.     Mouth/Throat:     Mouth: Mucous membranes are moist.     Pharynx: Oropharynx is  clear.  Eyes:     Pupils: Pupils are equal, round, and reactive to light.  Cardiovascular:  Rate and Rhythm: Normal rate and regular rhythm.     Pulses: Normal pulses.     Heart sounds: Normal heart sounds. No murmur heard. Pulmonary:     Effort: Pulmonary effort is normal.     Breath sounds: Rales present.  Abdominal:     General: Abdomen is flat. Bowel sounds are normal.     Palpations: Abdomen is soft.  Musculoskeletal:        General: No swelling.     Cervical back: Neck supple.  Skin:    General: Skin is warm.     Comments: Stage 2 pressure Wound in Left heel. Almost closed  Neurological:     General: No focal deficit present.     Mental Status: She is alert and oriented to person, place, and time.  Psychiatric:        Mood and Affect: Mood normal.        Thought Content: Thought content normal.    Labs reviewed: Recent Labs    03/28/21 0547 03/30/21 0503 04/04/21 1827 07/10/21 0000 09/17/21 0000  NA 137 137 145 138 138  K 4.0 3.5 4.5 4.3 4.2  CL 104 100 103 103 106  CO2 '28 31 31 ' 27* 27*  GLUCOSE 117* 101* 104*  --   --   BUN 22 36* 27* 21 20  CREATININE 1.00 1.31* 0.98 1.1 1.0  CALCIUM 9.2 8.9 9.7 9.6 9.3   Recent Labs    03/27/21 0543 04/04/21 1827 07/10/21 0000  AST 21 37 22  ALT '16 24 19  ' ALKPHOS 69 95 91  BILITOT 0.4 0.5  --   PROT 7.4 7.9  --   ALBUMIN 3.7 3.2* 3.8   Recent Labs    03/30/21 0503 03/31/21 0444 04/03/21 0000 04/04/21 1827 07/10/21 0000 08/07/21 0000 09/17/21 0000  WBC 10.7* 9.3   < > 10.3 5.7 9.2 5.1  NEUTROABS  --   --    < > 8.0* 3,671.00 6,771.00 2,601.00  HGB 7.6* 8.1*   < > 8.2* 9.4* 9.1* 9.1*  HCT 24.6* 26.1*   < > 27.8* 29* 29* 31*  MCV 83.4 82.9  --  85.3  --   --   --   PLT 83* 97*   < > 226 95* 131* 109*   < > = values in this interval not displayed.   Lab Results  Component Value Date   TSH 3.49 09/17/2021   No results found for: HGBA1C Lab Results  Component Value Date   CHOL 165 10/20/2020   HDL  44 10/20/2020   LDLCALC 94 10/20/2020   TRIG 168 (A) 10/20/2020   CHOLHDL 3.8 11/23/2018    Significant Diagnostic Results in last 30 days:  No results found.  Assessment/Plan 1. Right upper lobe pulmonary infiltrate Will start on Levaquin 500 mg for 7 days  2. Shortness of breath On Albuterol Nebs as needed  Xray did say mild CHF but she does not look clinically Volume overload Continue Demadex QOD for now 3. Pancytopenia (HCC) WBC 12.6 HGB 8.3 Platelets 97 Has had Work up before with Hematology Continue Iron Repeat Labs in 2 weeks  4. Closed trimalleolar fracture of right ankle with routine healing, subsequent encounter Doing well with Pain control and Weight bearing now with brace Walking 100 feet with Therapy  5. Depression with anxiety On Wellbutrin and Xanax and Zoloft  6. Stage 3a chronic kidney disease (HCC) Creat slightly worse Repeat in 2 weeks  7. Chronic bilateral low back  pain without sciatica On Low Dose of Oxycodone  8. Acquired hypothyroidism TSH normal in 02/23  9. Pressure injury of deep tissue of left heel Almost healed Covered with Foam dressing 10 LE edema Doing well with QOD Demadex 11 Hypertension BP on Lower side If continues change Cozaar dose    Family/ staff Communication: CBC,CMP in 2 weeks  Total time spent in this patient care encounter was  45_  minutes; greater than 50% of the visit spent counseling patient and staff, reviewing records , Labs and coordinating care for problems addressed at this encounter.    Labs/tests ordered:

## 2021-09-28 DIAGNOSIS — M6281 Muscle weakness (generalized): Secondary | ICD-10-CM | POA: Diagnosis not present

## 2021-09-28 DIAGNOSIS — S82851D Displaced trimalleolar fracture of right lower leg, subsequent encounter for closed fracture with routine healing: Secondary | ICD-10-CM | POA: Diagnosis not present

## 2021-09-28 DIAGNOSIS — Z9181 History of falling: Secondary | ICD-10-CM | POA: Diagnosis not present

## 2021-09-30 DIAGNOSIS — Z9181 History of falling: Secondary | ICD-10-CM | POA: Diagnosis not present

## 2021-09-30 DIAGNOSIS — M6281 Muscle weakness (generalized): Secondary | ICD-10-CM | POA: Diagnosis not present

## 2021-09-30 DIAGNOSIS — R2681 Unsteadiness on feet: Secondary | ICD-10-CM | POA: Diagnosis not present

## 2021-09-30 DIAGNOSIS — M545 Low back pain, unspecified: Secondary | ICD-10-CM | POA: Diagnosis not present

## 2021-10-01 ENCOUNTER — Non-Acute Institutional Stay (SKILLED_NURSING_FACILITY): Payer: Medicare Other | Admitting: Internal Medicine

## 2021-10-01 DIAGNOSIS — N1831 Chronic kidney disease, stage 3a: Secondary | ICD-10-CM | POA: Diagnosis not present

## 2021-10-01 DIAGNOSIS — F418 Other specified anxiety disorders: Secondary | ICD-10-CM

## 2021-10-01 DIAGNOSIS — R0602 Shortness of breath: Secondary | ICD-10-CM

## 2021-10-01 DIAGNOSIS — R918 Other nonspecific abnormal finding of lung field: Secondary | ICD-10-CM | POA: Diagnosis not present

## 2021-10-01 DIAGNOSIS — D61818 Other pancytopenia: Secondary | ICD-10-CM

## 2021-10-01 DIAGNOSIS — R5383 Other fatigue: Secondary | ICD-10-CM

## 2021-10-02 ENCOUNTER — Non-Acute Institutional Stay (SKILLED_NURSING_FACILITY): Payer: Medicare Other | Admitting: Orthopedic Surgery

## 2021-10-02 ENCOUNTER — Encounter: Payer: Self-pay | Admitting: Orthopedic Surgery

## 2021-10-02 ENCOUNTER — Encounter: Payer: Self-pay | Admitting: Internal Medicine

## 2021-10-02 DIAGNOSIS — R918 Other nonspecific abnormal finding of lung field: Secondary | ICD-10-CM

## 2021-10-02 DIAGNOSIS — R5383 Other fatigue: Secondary | ICD-10-CM | POA: Diagnosis not present

## 2021-10-02 DIAGNOSIS — I10 Essential (primary) hypertension: Secondary | ICD-10-CM | POA: Diagnosis not present

## 2021-10-02 DIAGNOSIS — N1831 Chronic kidney disease, stage 3a: Secondary | ICD-10-CM | POA: Diagnosis not present

## 2021-10-02 DIAGNOSIS — R0602 Shortness of breath: Secondary | ICD-10-CM

## 2021-10-02 LAB — BASIC METABOLIC PANEL
BUN: 19 (ref 4–21)
CO2: 28 — AB (ref 13–22)
Chloride: 102 (ref 99–108)
Creatinine: 1 (ref 0.5–1.1)
Glucose: 143
Potassium: 4 mEq/L (ref 3.5–5.1)
Sodium: 137 (ref 137–147)

## 2021-10-02 LAB — CBC AND DIFFERENTIAL
HCT: 26 — AB (ref 36–46)
Hemoglobin: 7.8 — AB (ref 12.0–16.0)
Neutrophils Absolute: 9050
Platelets: 181 10*3/uL (ref 150–400)
WBC: 10.8

## 2021-10-02 LAB — CBC: RBC: 2.89 — AB (ref 3.87–5.11)

## 2021-10-02 LAB — COMPREHENSIVE METABOLIC PANEL: Calcium: 8.9 (ref 8.7–10.7)

## 2021-10-02 NOTE — Progress Notes (Signed)
Location: [facility] features [facility] of Service:  SNF (31)  Provider:   Code Status: DNR Goals of Care:  Advanced Directives 09/24/2021  Does Patient Have a Medical Advance Directive? Yes  Type of Paramedic of [place];Out of facility DNR (pink MOST or yellow form)  Does patient want to make changes to medical advance directive? No - Patient declined  Copy of [place] in Chart? Yes - validated most recent copy scanned in chart (See row information)  Pre-existing out of facility DNR order (yellow form or pink MOST form) -     Chief Complaint  Patient presents with   Acute Visit    HPI: Patient is a 88 y.o. female seen today for an acute visit for Lethargy Poor Appetite  Patient has h/o Pancytopenia , h/o Chronic Back Pain, Hypertension, Hypothyroidism, Hypertension, Recurrent Cystitis, Osteoporosis and Major Depression  Admitted in the hospital From 9/1-9/7 for Right Trimalleolar Fracture. S/P ORIF   Since then she has been in SNF Diagnosed with RUL infiltrate Pneumonia 1 week ago  Started on Levaquin.  By the facility nurse and her son say that she continues to stay very sleepy.  Her appetite is poor. When I saw her today she did  get up.  Said she feels very weak ,tired ,short of breath.   hurt all over.  And then she went to sleep again. Vitals have been stable Cough Present. Pox stable on Low Oxygen  Past Medical History:  Diagnosis Date   Anemia    Anxiety    Chronotropic incompetence 12/2012    potentially medication related; noted on CPET    DDD (degenerative disc disease)    Eczema    GERD (gastroesophageal reflux disease)    H/O hiatal hernia    Hx: UTI (urinary tract infection)    Hyperlipidemia    Hypertension    Hypothyroidism    Osteopenia    Septicemia ([place]) 2002   following UTI   Vertigo, benign positional     Past Surgical History:  Procedure Laterality Date   BREAST SURGERY     left biopsy    CATARACT EXTRACTION Right    2 weeks ago   CPET / MET - PFTS     Consistent with chronotropic incompetence; See attached report in the results section   DILATION AND CURETTAGE OF UTERUS     x 2   DOPPLER ECHOCARDIOGRAPHY  01/10/2013   Normal LV size and function. Normal EF. Air sclerosis but no stenosis   ESOPHAGOGASTRODUODENOSCOPY  05/04/2012   Procedure: ESOPHAGOGASTRODUODENOSCOPY (EGD);  Surgeon: Inda Castle, MD;  Location: [facility];  Service: Endoscopy;  Laterality: N/A;   EYE SURGERY     cataract extraction with ILO  ? eye   IR KYPHO EA ADDL LEVEL THORACIC OR LUMBAR  10/28/2016   IR KYPHO LUMBAR INC FX REDUCE BONE BX UNI/BIL CANNULATION INC/IMAGING  10/28/2016   IR KYPHO THORACIC WITH BONE BIOPSY  12/24/2016   IR RADIOLOGIST EVAL & MGMT  11/11/2016   KNEE ARTHROSCOPY  2011   right   ORIF ANKLE FRACTURE Right 03/27/2021   Procedure: OPEN REDUCTION INTERNAL FIXATION (ORIF) ANKLE FRACTURE;  Surgeon: Marchia Bond, MD;  Location: [facility];  Service: Orthopedics;  Laterality: Right;   TONSILLECTOMY     TOTAL KNEE ARTHROPLASTY  04/24/2012   Procedure: TOTAL KNEE ARTHROPLASTY;  Surgeon: Gearlean Alf, MD;  Location: [facility];  Service: Orthopedics;  Laterality: Right;  ECHOCARDIOGRAM  02/2018  ° Ordered by PCP for "congestive heart failure " -EF 60 M 65%.  GR 1 DD.  No R WMA--- > ESSENTIALLY NORMAL  ° ° °No Known Allergies ° °Outpatient Encounter Medications as of 10/01/2021  °Medication Sig  ° acetaminophen (TYLENOL) 500 MG tablet Take 1,000 mg by mouth every 8 (eight) hours. For pain  ° Albuterol Sulfate 2.5 MG/0.5ML NEBU Inhale into the lungs every 6 (six) hours as needed (As needed for wheezing or shortness of breath).  ° ALPRAZolam (XANAX) 0.25 MG tablet Take 1 tablet (0.25 mg total) by mouth daily.  ° atorvastatin (LIPITOR) 10 MG tablet Take 5 mg by mouth at bedtime.  ° buPROPion (WELLBUTRIN SR) 150 MG 12 hr tablet Take 1 tablet (150 mg total) by mouth 2 (two) times daily after a  meal.  ° calcium citrate-vitamin D (CITRACAL+D) 315-200 MG-UNIT tablet Take 1 tablet by mouth 2 (two) times daily.  ° Camphor-Menthol-Methyl Sal 3.07-31-08 % PTCH Place 1 patch onto the skin See admin instructions. Apply one patch onto the skin of the  lower back daily for back pain  ° Carboxymethylcellulose Sodium (ARTIFICIAL TEARS OP) Place 1 drop into both eyes in the morning, at noon, and at bedtime.  ° diclofenac Sodium (VOLTAREN) 1 % GEL Apply 4 g topically in the morning and at bedtime. Apply to bilateral ankles BID  ° ferrous sulfate 325 (65 FE) MG EC tablet Take 325 mg by mouth every Monday, Wednesday, and Friday. With a meal  ° fish oil-omega-3 fatty acids 1000 MG capsule Take 1 g by mouth every morning.  ° gabapentin (NEURONTIN) 100 MG capsule Take 100 mg by mouth 3 (three) times daily.  ° hydrocortisone cream 1 % Apply 1 application topically daily as needed for itching.  ° [EXPIRED] levofloxacin (LEVAQUIN) 500 MG tablet Take 500 mg by mouth daily.  ° levothyroxine (SYNTHROID) 75 MCG tablet Take 75 mcg by mouth every morning.  ° losartan (COZAAR) 25 MG tablet Take 25 mg by mouth every morning.  ° melatonin 5 MG TABS Take 1 tablet (5 mg total) by mouth at bedtime.  ° Menthol, Topical Analgesic, (BIOFREEZE) 4 % GEL Apply topically 2 (two) times daily as needed. Apply thin layer to left thigh  ° Nutritional Supplements (ARGINAID EXTRA) LIQD Take 8 oz by mouth daily.  ° nystatin (MYCOSTATIN/NYSTOP) powder Apply 1 application topically 2 (two) times daily as needed (yeast).  ° omeprazole (PRILOSEC) 40 MG capsule Take 40 mg by mouth every morning.  ° OxyCODONE HCl, Abuse Deter, (OXAYDO) 5 MG TABA Take 2.5 mg by mouth 2 (two) times daily as needed. Give 30 minutes before transportation to ortho appointments.  ° polyethylene glycol (MIRALAX / GLYCOLAX) 17 g packet Take 17 g by mouth daily as needed (constipation).  ° potassium chloride SA (KLOR-CON M) 20 MEQ tablet Take 20 mEq by mouth daily.  ° senna (SENOKOT)  8.6 MG TABS tablet Take 2 tablets by mouth at bedtime.  ° sertraline (ZOLOFT) 25 MG tablet Take 75 mg by mouth every morning.  ° torsemide (DEMADEX) 20 MG tablet Take 20 mg by mouth every other day.  ° zinc oxide 20 % ointment Apply 1 application topically See admin instructions. Apply topically to per/buttocks after every incontinent episode and as needed for redness  ° °No facility-administered encounter medications on file as of 10/01/2021.  ° ° °Review of Systems:  °Review of Systems  °Constitutional:  Positive for activity change and appetite change.  °HENT: Negative.    °  Respiratory:  Positive for cough and shortness of breath.   °Cardiovascular:  Negative for leg swelling.  °Gastrointestinal:  Negative for constipation.  °Genitourinary: Negative.   °Musculoskeletal:  Positive for gait problem. Negative for arthralgias and myalgias.  °Skin: Negative.   °Neurological:  Positive for weakness. Negative for dizziness.  °Psychiatric/Behavioral:  Positive for confusion and sleep disturbance. Negative for dysphoric mood.   ° °Health Maintenance  °Topic Date Due  ° Pneumonia Vaccine 65+ Years old (2 - PCV) 05/14/2020  ° COVID-19 Vaccine (4 - Booster) 06/10/2021  ° TETANUS/TDAP  05/14/2029  ° INFLUENZA VACCINE  Completed  ° DEXA SCAN  Completed  ° Zoster Vaccines- Shingrix  Completed  ° HPV VACCINES  Aged Out  ° ° °Physical Exam: °Vitals:  ° 10/01/21 0937  °BP: (!) 147/66  °Pulse: 82  °Resp: (!) 22  °Temp: 97.8 °F (36.6 °C)  °SpO2: 91%  °Weight: 169 lb 8 oz (76.9 kg)  ° °Body mass index is 29.09 kg/m². °Physical Exam °Vitals reviewed.  °Constitutional:   °   Appearance: Normal appearance.  °   Comments: Somnolence °  °HENT:  °   Head: Normocephalic.  °   Nose: Nose normal.  °   Mouth/Throat:  °   Mouth: Mucous membranes are moist.  °   Pharynx: Oropharynx is clear.  °Eyes:  °   Pupils: Pupils are equal, round, and reactive to light.  °Cardiovascular:  °   Rate and Rhythm: Normal rate and regular rhythm.  °   Pulses:  Normal pulses.  °   Heart sounds: Normal heart sounds. No murmur heard. °Pulmonary:  °   Effort: Pulmonary effort is normal.  °   Breath sounds: Rales present.  °   Comments: Bilateral Rales °Abdominal:  °   General: Abdomen is flat. Bowel sounds are normal.  °   Palpations: Abdomen is soft.  °Musculoskeletal:     °   General: No swelling.  °   Cervical back: Neck supple.  °   Comments: Chronic Venous changes  °Skin: °   General: Skin is warm.  °Neurological:  °   General: No focal deficit present.  °   Mental Status: She is oriented to person, place, and time.  °   Comments: Sleepy  °Psychiatric:     °   Mood and Affect: Mood normal.     °   Thought Content: Thought content normal.  ° ° °Labs reviewed: °Basic Metabolic Panel: °Recent Labs  °  10/20/20 °0000 03/26/21 °0334 03/28/21 °0547 03/30/21 °0503 04/04/21 °1827 07/10/21 °0000 09/17/21 °0000  °NA 140   < > 137 137 145 138 138  °K 4.2   < > 4.0 3.5 4.5 4.3 4.2  °CL 103   < > 104 100 103 103 106  °CO2 27*   < > 28 31 31 27* 27*  °GLUCOSE  --    < > 117* 101* 104*  --   --   °BUN 38*   < > 22 36* 27* 21 20  °CREATININE 1.6*   < > 1.00 1.31* 0.98 1.1 1.0  °CALCIUM 9.7   < > 9.2 8.9 9.7 9.6 9.3  °TSH 3.82  --   --   --   --   --  3.49  ° < > = values in this interval not displayed.  ° °Liver Function Tests: °Recent Labs  °  03/27/21 °0543 04/04/21 °1827 07/10/21 °0000  °AST 21 37 22  °ALT 16   24 19  °ALKPHOS 69 95 91  °BILITOT 0.4 0.5  --   °PROT 7.4 7.9  --   °ALBUMIN 3.7 3.2* 3.8  ° °No results for input(s): LIPASE, AMYLASE in the last 8760 hours. °No results for input(s): AMMONIA in the last 8760 hours. °CBC: °Recent Labs  °  03/30/21 °0503 03/31/21 °0444 04/03/21 °0000 04/04/21 °1827 07/10/21 °0000 08/07/21 °0000 09/17/21 °0000  °WBC 10.7* 9.3   < > 10.3 5.7 9.2 5.1  °NEUTROABS  --   --    < > 8.0* 3,671.00 6,771.00 2,601.00  °HGB 7.6* 8.1*   < > 8.2* 9.4* 9.1* 9.1*  °HCT 24.6* 26.1*   < > 27.8* 29* 29* 31*  °MCV 83.4 82.9  --  85.3  --   --   --   °PLT 83* 97*    < > 226 95* 131* 109*  ° < > = values in this interval not displayed.  ° °Lipid Panel: °Recent Labs  °  10/20/20 °0000  °CHOL 165  °HDL 44  °LDLCALC 94  °TRIG 168*  ° °No results found for: HGBA1C ° °Procedures since last visit: °No results found. ° °Assessment/Plan °1. Right upper lobe pulmonary infiltrate °Finished Antibiotics °No Fever but continues to be weak and sleepy °Repeat Chest Xray ° °2. Shortness of breath °Repeat Chest Xray °On Nebs °POX stable on Oxygen ° °3. Pancytopenia (HCC) °On Iron °Previous Work with Hematology No Aggressive  °Repeat CBC ° °4. Lethargy °Repeat CBC,CMP °Discussed with the patient's son goals of care.  He does not want her to go to the hospital right now wants to wait for the x-ray and the lab results. °Also making her Xanax as needed and Stop melatonin °If she does not get better then  can consider hospice ° °5. Depression with anxiety °Continue Wellbutrin and Zoloft ° °6. Stage 3a chronic kidney disease (HCC) °Stable. Slightly up last visit °7 diastolic CHF °On low-dose of Demadex °Repeating the x-ray and labs.  No change of the dose today ° °8. Acquired hypothyroidism °TSH normal in 02/23 °  °9. Pressure injury of deep tissue of left heel °Almost healed °Covered with Foam dressing °10 LE edema °Doing well with QOD Demadex °11 Hypertension °On cozaar °12 Chronic bilateral low back pain without sciatica °On Low Dose of Oxycodone PRn °  °Labs/tests ordered:   °Next appt:  Visit date not found ° °

## 2021-10-02 NOTE — Progress Notes (Signed)
Location:  [facility] Room Number: N35/A Place of Service:  SNF (31) Provider: Yvonna Alanis, NP  Patient Care Team: Virgie Dad, MD as PCP - General (Internal Medicine) Mast, Man X, NP as Nurse Practitioner (Internal Medicine)  Extended Emergency Contact Information Primary Emergency Contact: Hillier,[name] Address: [address]          [place], [place] [address] [place] Phone: [phone] Mobile Phone: [phone] Relation: Son Secondary Emergency Contact: [name] [place] Phone: [phone] Mobile Phone: [phone] Relation: Other  Code Status:  DNR Goals of care: Advanced Directive information Advanced Directives 10/02/2021  Does Patient Have a Medical Advance Directive? Yes  Type of Paramedic of [place];Out of facility DNR (pink MOST or yellow form)  Does patient want to make changes to medical advance directive? No - Patient declined  Copy of [place] in Chart? Yes - validated most recent copy scanned in chart (See row information)  Pre-existing out of facility DNR order (yellow form or pink MOST form) -     Chief Complaint  Patient presents with   Acute Visit    Fatigue    HPI:  Pt is a 88 y.o. female seen today for an acute visit for pneumonia.   Recently diagnosed with right upper lobe pneumonia. She was started on Levaquin 500 mg and mucinex. She continues to have increased sleepiness. Scheduled Xanax recently discontinued. She is easily aroused and appropriate today. Denies chest pain or sob. CXR, cbc/diff, cmp ordered 03/09- pending. Remains on continuous oxygen 2-3 liters. Sats > 90%.    Past Medical History:  Diagnosis Date   Anemia    Anxiety    Chronotropic incompetence 12/2012    potentially medication related; noted on CPET    DDD (degenerative disc disease)    Eczema    GERD (gastroesophageal reflux disease)    H/O hiatal hernia     Hx: UTI (urinary tract infection)    Hyperlipidemia    Hypertension    Hypothyroidism    Osteopenia    Septicemia ([place]) 2002   following UTI   Vertigo, benign positional    Past Surgical History:  Procedure Laterality Date   BREAST SURGERY     left biopsy   CATARACT EXTRACTION Right    2 weeks ago   CPET / MET - PFTS     Consistent with chronotropic incompetence; See attached report in the results section   DILATION AND CURETTAGE OF UTERUS     x 2   DOPPLER ECHOCARDIOGRAPHY  01/10/2013   Normal LV size and function. Normal EF. Air sclerosis but no stenosis   ESOPHAGOGASTRODUODENOSCOPY  05/04/2012   Procedure: ESOPHAGOGASTRODUODENOSCOPY (EGD);  Surgeon: Inda Castle, MD;  Location: [facility];  Service: Endoscopy;  Laterality: N/A;   EYE SURGERY     cataract extraction with ILO  ? eye   IR KYPHO EA ADDL LEVEL THORACIC OR LUMBAR  10/28/2016   IR KYPHO LUMBAR INC FX REDUCE BONE BX UNI/BIL CANNULATION INC/IMAGING  10/28/2016   IR KYPHO THORACIC WITH BONE BIOPSY  12/24/2016   IR RADIOLOGIST EVAL & MGMT  11/11/2016   KNEE ARTHROSCOPY  2011   right   ORIF ANKLE FRACTURE Right 03/27/2021   Procedure: OPEN REDUCTION INTERNAL FIXATION (ORIF) ANKLE FRACTURE;  Surgeon: Marchia Bond, MD;  Location: [facility];  Service: Orthopedics;  Laterality: Right;   TONSILLECTOMY     TOTAL KNEE ARTHROPLASTY  04/24/2012   Procedure: TOTAL KNEE ARTHROPLASTY;  Surgeon: Gearlean Alf, MD;  Location: [facility];  Service: Orthopedics;  Laterality: Right;   TRANSTHORACIC ECHOCARDIOGRAM  02/2018   Ordered by PCP for "congestive heart failure " -EF 60 M 65%.  GR 1 DD.  No R WMA--- > ESSENTIALLY NORMAL    No Known Allergies  Outpatient Encounter Medications as of 10/02/2021  Medication Sig   acetaminophen (TYLENOL) 500 MG tablet Take 1,000 mg by mouth every 8 (eight) hours. For pain   Albuterol Sulfate 2.5 MG/0.5ML NEBU Inhale into the lungs every 6 (six) hours as needed (As needed for wheezing or shortness of  breath).   ALPRAZolam (XANAX) 0.25 MG tablet Take 1 tablet (0.25 mg total) by mouth daily.   atorvastatin (LIPITOR) 10 MG tablet Take 5 mg by mouth at bedtime.   buPROPion (WELLBUTRIN SR) 150 MG 12 hr tablet Take 1 tablet (150 mg total) by mouth 2 (two) times daily after a meal.   calcium citrate-vitamin D (CITRACAL+D) 315-200 MG-UNIT tablet Take 1 tablet by mouth 2 (two) times daily.   Camphor-Menthol-Methyl Sal 3.07-31-08 % PTCH Place 1 patch onto the skin See admin instructions. Apply one patch onto the skin of the  lower back daily for back pain   Carboxymethylcellulose Sodium (ARTIFICIAL TEARS OP) Place 1 drop into both eyes in the morning, at noon, and at bedtime.   diclofenac Sodium (VOLTAREN) 1 % GEL Apply 4 g topically in the morning and at bedtime. Apply to bilateral ankles BID   ferrous sulfate 325 (65 FE) MG EC tablet Take 325 mg by mouth every Monday, Wednesday, and Friday. With a meal   fish oil-omega-3 fatty acids 1000 MG capsule Take 1 g by mouth every morning.   gabapentin (NEURONTIN) 100 MG capsule Take 100 mg by mouth 3 (three) times daily.   guaiFENesin (MUCINEX) 600 MG 12 hr tablet Take 600 mg by mouth every 12 (twelve) hours as needed.   hydrocortisone cream 1 % Apply 1 application topically daily as needed for itching.   levothyroxine (SYNTHROID) 75 MCG tablet Take 75 mcg by mouth every morning.   losartan (COZAAR) 25 MG tablet Take 25 mg by mouth every morning.   Menthol, Topical Analgesic, (BIOFREEZE) 4 % GEL Apply topically 2 (two) times daily as needed. Apply thin layer to left thigh   Nutritional Supplements (ARGINAID EXTRA) LIQD Take 8 oz by mouth daily.   nystatin (MYCOSTATIN/NYSTOP) powder Apply 1 application topically 2 (two) times daily as needed (yeast).   omeprazole (PRILOSEC) 40 MG capsule Take 40 mg by mouth every morning.   OxyCODONE HCl, Abuse Deter, (OXAYDO) 5 MG TABA Take 2.5 mg by mouth 2 (two) times daily as needed. Give 30 minutes before transportation to  ortho appointments.   polyethylene glycol (MIRALAX / GLYCOLAX) 17 g packet Take 17 g by mouth daily as needed (constipation).   potassium chloride SA (KLOR-CON M) 20 MEQ tablet Take 20 mEq by mouth daily.   senna (SENOKOT) 8.6 MG TABS tablet Take 2 tablets by mouth at bedtime.   sertraline (ZOLOFT) 25 MG tablet Take 75 mg by mouth every morning.   torsemide (DEMADEX) 20 MG tablet Take 20 mg by mouth every other day.   zinc oxide 20 % ointment Apply 1 application topically See admin instructions. Apply topically to per/buttocks after every incontinent episode and as needed for redness   [DISCONTINUED] melatonin 5 MG TABS Take 1 tablet (5 mg total) by mouth at bedtime. (Patient  not taking: Reported on 10/02/2021)   No facility-administered encounter medications on file as of 10/02/2021.    Review of Systems  Constitutional:  Positive for fatigue. Negative for activity change, appetite change, chills and fever.  HENT:  Negative for congestion and trouble swallowing.   Eyes:  Negative for visual disturbance.  Respiratory:  Positive for cough, shortness of breath and wheezing.   Cardiovascular:  Negative for chest pain and leg swelling.  Gastrointestinal:  Negative for abdominal distention, abdominal pain, constipation, diarrhea and nausea.  Genitourinary:  Negative for dysuria, frequency and hematuria.  Musculoskeletal:  Positive for arthralgias and gait problem.  Skin:  Negative for wound.  Neurological:  Positive for weakness. Negative for dizziness and headaches.  Psychiatric/Behavioral:  Positive for confusion and dysphoric mood. Negative for sleep disturbance. The patient is nervous/anxious.    Immunization History  Administered Date(s) Administered   Influenza, High Dose Seasonal PF 04/26/2016, 03/22/2017, 05/08/2019   Influenza,inj,Quad PF,6+ Mos 04/27/2018   Influenza-Unspecified 04/22/2011, 04/04/2012, 05/03/2013, 05/04/2013, 04/17/2014, 04/19/2015, 04/26/2016, 04/22/2017, 05/20/2021    Moderna SARS-COV2 Booster Vaccination 06/09/2020, 12/31/2020   Moderna Sars-Covid-2 Vaccination 07/30/2019, 08/27/2019   Pfizer Covid-19 Vaccine Bivalent Booster 16yr & up 04/15/2021   Pneumococcal Polysaccharide-23 08/26/2003, 09/06/2011, 05/15/2019   Pneumococcal-Unspecified 12/20/2013   Td 09/26/2006   Tdap 05/15/2019   Zoster Recombinat (Shingrix) 11/21/2017, 04/08/2018   Zoster, Live 10/25/2007   Pertinent  Health Maintenance Due  Topic Date Due   INFLUENZA VACCINE  Completed   DEXA SCAN  Completed   Fall Risk 03/31/2021 03/31/2021 03/31/2021 04/04/2021 09/18/2021  Falls in the past year? - - - - 1  Was there an injury with Fall? - - - - 1  Fall Risk Category Calculator - - - - 3  Fall Risk Category - - - - High  Patient Fall Risk Level High fall risk High fall risk High fall risk High fall risk High fall risk  Patient at Risk for Falls Due to - - - - History of fall(s);Impaired balance/gait;Impaired mobility;Orthopedic patient  Fall risk Follow up - - - - Falls evaluation completed;Education provided;Falls prevention discussed   Functional Status Survey:    Vitals:   10/02/21 1142  BP: (!) 147/66  Pulse: 82  Resp: (!) 22  Temp: (!) 97.4 F (36.3 C)  SpO2: 95%  Weight: 169 lb 8 oz (76.9 kg)  Height: '5\' 4"'  (1.626 m)   Body mass index is 29.09 kg/m. Physical Exam Vitals reviewed.  Constitutional:      General: She is not in acute distress. HENT:     Head: Normocephalic.  Eyes:     General:        Right eye: No discharge.        Left eye: No discharge.  Cardiovascular:     Rate and Rhythm: Normal rate and regular rhythm.     Pulses: Normal pulses.     Heart sounds: Normal heart sounds.  Pulmonary:     Effort: Pulmonary effort is normal. No respiratory distress.     Breath sounds: Examination of the right-upper field reveals rhonchi. Examination of the left-upper field reveals rhonchi. Examination of the right-middle field reveals decreased breath sounds.  Decreased breath sounds and rhonchi present. No wheezing.     Comments: 3 liters oxygen Abdominal:     General: Bowel sounds are normal. There is no distension.     Palpations: Abdomen is soft.     Tenderness: There is no abdominal tenderness.  Musculoskeletal:  Cervical back: Neck supple.     Right lower leg: No edema.     Left lower leg: No edema.  Skin:    General: Skin is warm and dry.     Capillary Refill: Capillary refill takes less than 2 seconds.  Neurological:     General: No focal deficit present.     Mental Status: She is alert. Mental status is at baseline.     Motor: Weakness present.     Gait: Gait abnormal.  Psychiatric:        Mood and Affect: Mood normal.        Behavior: Behavior normal.    Labs reviewed: Recent Labs    03/28/21 0547 03/30/21 0503 04/04/21 1827 07/10/21 0000 09/17/21 0000 09/24/21 0000  NA 137 137 145 138 138 138  K 4.0 3.5 4.5 4.3 4.2 4.3  CL 104 100 103 103 106 103  CO2 '28 31 31 ' 27* 27* 25*  GLUCOSE 117* 101* 104*  --   --   --   BUN 22 36* 27* 21 20 26*  CREATININE 1.00 1.31* 0.98 1.1 1.0 1.2*  CALCIUM 9.2 8.9 9.7 9.6 9.3 9.3   Recent Labs    03/27/21 0543 04/04/21 1827 07/10/21 0000 09/24/21 0000  AST 21 37 22 18  ALT '16 24 19 10  ' ALKPHOS 69 95 91 86  BILITOT 0.4 0.5  --   --   PROT 7.4 7.9  --   --   ALBUMIN 3.7 3.2* 3.8 3.5   Recent Labs    03/30/21 0503 03/31/21 0444 04/03/21 0000 04/04/21 1827 07/10/21 0000 08/07/21 0000 09/17/21 0000 09/24/21 0000  WBC 10.7* 9.3   < > 10.3   < > 9.2 5.1 12.6  NEUTROABS  --   --    < > 8.0*   < > 6,771.00 2,601.00 10,206.00  HGB 7.6* 8.1*   < > 8.2*   < > 9.1* 9.1* 8.3*  HCT 24.6* 26.1*   < > 27.8*   < > 29* 31* 28*  MCV 83.4 82.9  --  85.3  --   --   --   --   PLT 83* 97*   < > 226   < > 131* 109* 21*   < > = values in this interval not displayed.   Lab Results  Component Value Date   TSH 3.49 09/17/2021   No results found for: HGBA1C Lab Results  Component  Value Date   CHOL 165 10/20/2020   HDL 44 10/20/2020   LDLCALC 94 10/20/2020   TRIG 168 (A) 10/20/2020   CHOLHDL 3.8 11/23/2018    Significant Diagnostic Results in last 30 days:  No results found.  Assessment/Plan 1. Right upper lobe pulmonary infiltrate - diminished lung sounds to right middle lobe - remains on oxygen - repeat CXR- pending - cbc/diff- pending - Levaquin complete - cont mucinex - start incentive spirometer- 10x/ QID and prn  2. Shortness of breath - see above  3. Lethargy - improved without scheduled xanax - cmp- pending   Family/ staff Communication: plan discussed with patient and nurse  Labs/tests ordered:  cbc/diff, cmp, CXR- pending

## 2021-10-05 DIAGNOSIS — M6281 Muscle weakness (generalized): Secondary | ICD-10-CM | POA: Diagnosis not present

## 2021-10-05 DIAGNOSIS — Z9181 History of falling: Secondary | ICD-10-CM | POA: Diagnosis not present

## 2021-10-05 DIAGNOSIS — S82851D Displaced trimalleolar fracture of right lower leg, subsequent encounter for closed fracture with routine healing: Secondary | ICD-10-CM | POA: Diagnosis not present

## 2021-10-06 ENCOUNTER — Non-Acute Institutional Stay (SKILLED_NURSING_FACILITY): Payer: Medicare Other | Admitting: Nurse Practitioner

## 2021-10-06 ENCOUNTER — Encounter: Payer: Self-pay | Admitting: Nurse Practitioner

## 2021-10-06 DIAGNOSIS — D638 Anemia in other chronic diseases classified elsewhere: Secondary | ICD-10-CM

## 2021-10-06 DIAGNOSIS — I10 Essential (primary) hypertension: Secondary | ICD-10-CM

## 2021-10-06 DIAGNOSIS — I5189 Other ill-defined heart diseases: Secondary | ICD-10-CM | POA: Diagnosis not present

## 2021-10-06 DIAGNOSIS — E039 Hypothyroidism, unspecified: Secondary | ICD-10-CM

## 2021-10-06 DIAGNOSIS — G8929 Other chronic pain: Secondary | ICD-10-CM

## 2021-10-06 DIAGNOSIS — K219 Gastro-esophageal reflux disease without esophagitis: Secondary | ICD-10-CM | POA: Diagnosis not present

## 2021-10-06 DIAGNOSIS — K5909 Other constipation: Secondary | ICD-10-CM

## 2021-10-06 DIAGNOSIS — F339 Major depressive disorder, recurrent, unspecified: Secondary | ICD-10-CM

## 2021-10-06 DIAGNOSIS — J189 Pneumonia, unspecified organism: Secondary | ICD-10-CM

## 2021-10-06 DIAGNOSIS — N1831 Chronic kidney disease, stage 3a: Secondary | ICD-10-CM | POA: Diagnosis not present

## 2021-10-06 DIAGNOSIS — E785 Hyperlipidemia, unspecified: Secondary | ICD-10-CM | POA: Diagnosis not present

## 2021-10-06 DIAGNOSIS — M545 Low back pain, unspecified: Secondary | ICD-10-CM | POA: Diagnosis not present

## 2021-10-06 NOTE — Assessment & Plan Note (Signed)
takes Gabapentin, Tylenol, Oxycodone,  10/16/20 X-ray Vertebroplasty of T9, T11, L1, ?and L2 have been performed with stable compression deformities ?

## 2021-10-06 NOTE — Assessment & Plan Note (Signed)
takes Levothyroxine, TSH 3.49 09/17/21 ?

## 2021-10-06 NOTE — Progress Notes (Signed)
?Location:   [facility] ?[facility] Room Number: 35 ?Place of Service:  SNF (31) ?Provider: Marlana Latus NP ? ?Virgie Dad, MD ? ?Patient Care Team: ?Virgie Dad, MD as PCP - General (Internal Medicine) ?Skylen Spiering X, NP as Nurse Practitioner (Internal Medicine) ? ?Extended Emergency Contact Information ?Primary Emergency Contact: Sundell,[name] ?Address: [address] ?         [place], [place] [address] [place] ?Home Phone: [phone] ?Mobile Phone: [phone] ?Relation: Son ?Secondary Emergency Contact: Glaeser, Dino ? [place] ?Home Phone: [phone] ?Mobile Phone: [phone] ?Relation: Other ? ?Code Status:  DNR ?Goals of care: Advanced Directive information ?Advanced Directives 10/06/2021  ?Does Patient Have a Medical Advance Directive? Yes  ?Type of Paramedic of [place];Out of facility DNR (pink MOST or yellow form)  ?Does patient want to make changes to medical advance directive? No - Patient declined  ?Copy of [place] in Chart? Yes - validated most recent copy scanned in chart (See row information)  ?Pre-existing out of facility DNR order (yellow form or pink MOST form) -  ? ? ? ?Chief Complaint  ?Patient presents with  ? Acute Visit  ?  SOB  ? ? ?HPI:  ?Pt is a 88 y.o. female seen today for an acute visit for persisted SOB, feeling weak. 03/24/2018 echocardiogram showed mild diastolic dysfunction, EF 67-67%.  ?  ?CKD, stage 3 eGFR Bun/creat 26/1.2 eGFR 44 09/24/21 ?            Hx of recurrent UTI ?            HTN, blood pressure is controlled on Losartan  ?            Hemorrhoids, no injury.              ?            Anemia, had work up but no biopsy 2/2 her age, takes Fe, stable. Hgb 8.3 09/24/21 ?            Lower back pain, takes Gabapentin, Tylenol, Oxycodone,  10/16/20 X-ray Vertebroplasty of T9, T11, L1, ?and L2 have been performed with stable compression deformities ?            Hypothyroidism, takes Levothyroxine, TSH 3.49  09/17/21 ?            GERD, stable, off Omeprazole  ? Hyperlipidemia, takes Atorvastatin, LDL 94 10/20/20 ?            Edema BLE, minimal, takes Torsemide ?            Depression, takes Sertraline, Bupropion, Alprazolam ?            constipation, takes MiraLax, Senna.  ?  ?  ? ?Past Medical History:  ?Diagnosis Date  ? Anemia   ? Anxiety   ? Chronotropic incompetence 12/2012  ?  potentially medication related; noted on CPET   ? DDD (degenerative disc disease)   ? Eczema   ? GERD (gastroesophageal reflux disease)   ? H/O hiatal hernia   ? Hx: UTI (urinary tract infection)   ? Hyperlipidemia   ? Hypertension   ? Hypothyroidism   ? Osteopenia   ? Septicemia ([place]) 2002  ? following UTI  ? Vertigo, benign positional   ? ?Past Surgical History:  ?Procedure Laterality Date  ? BREAST SURGERY    ? left biopsy  ? CATARACT EXTRACTION Right   ? 2  weeks ago  ? CPET / MET - PFTS    ? Consistent with chronotropic incompetence; See attached report in the results section  ? DILATION AND CURETTAGE OF UTERUS    ? x 2  ? DOPPLER ECHOCARDIOGRAPHY  01/10/2013  ? Normal LV size and function. Normal EF. Air sclerosis but no stenosis  ? ESOPHAGOGASTRODUODENOSCOPY  05/04/2012  ? Procedure: ESOPHAGOGASTRODUODENOSCOPY (EGD);  Surgeon: Inda Castle, MD;  Location: [facility];  Service: Endoscopy;  Laterality: N/A;  ? EYE SURGERY    ? cataract extraction with ILO  ? eye  ? IR KYPHO EA ADDL LEVEL THORACIC OR LUMBAR  10/28/2016  ? IR KYPHO LUMBAR INC FX REDUCE BONE BX UNI/BIL CANNULATION INC/IMAGING  10/28/2016  ? IR KYPHO THORACIC WITH BONE BIOPSY  12/24/2016  ? IR RADIOLOGIST EVAL & MGMT  11/11/2016  ? KNEE ARTHROSCOPY  2011  ? right  ? ORIF ANKLE FRACTURE Right 03/27/2021  ? Procedure: OPEN REDUCTION INTERNAL FIXATION (ORIF) ANKLE FRACTURE;  Surgeon: Marchia Bond, MD;  Location: [facility];  Service: Orthopedics;  Laterality: Right;  ? TONSILLECTOMY    ? TOTAL KNEE ARTHROPLASTY  04/24/2012  ? Procedure: TOTAL KNEE ARTHROPLASTY;  Surgeon: Gearlean Alf,  MD;  Location: [facility];  Service: Orthopedics;  Laterality: Right;  ? TRANSTHORACIC ECHOCARDIOGRAM  02/2018  ? Ordered by PCP for "congestive heart failure " -EF 60 M 65%.  GR 1 DD.  No R WMA--- > ESSENTIALLY NORMAL  ? ? ?No Known Allergies ? ?Allergies as of 10/06/2021   ?No Known Allergies ?  ? ?  ?Medication List  ?  ? ?  ? Accurate as of October 06, 2021 11:59 PM. If you have any questions, ask your nurse or doctor.  ?  ?  ? ?  ? ?STOP taking these medications   ? ?Arginaid Extra Liqd ?Stopped by: Takara Sermons X Keelyn Fjelstad, NP ?  ?guaiFENesin 600 MG 12 hr tablet ?Commonly known as: [place] ?Stopped by: Abanoub Hanken X Laelia Angelo, NP ?  ? ?  ? ?TAKE these medications   ? ?acetaminophen 500 MG tablet ?Commonly known as: TYLENOL ?Take 1,000 mg by mouth every 8 (eight) hours. For pain ?  ?Albuterol Sulfate 2.5 MG/0.5ML Nebu ?Inhale into the lungs every 6 (six) hours as needed (As needed for wheezing or shortness of breath). ?  ?ALPRAZolam 0.25 MG tablet ?Commonly known as: Duanne Moron ?Take 1 tablet (0.25 mg total) by mouth daily. ?  ?ARTIFICIAL TEARS OP ?Place 1 drop into both eyes in the morning, at noon, and at bedtime. ?  ?atorvastatin 10 MG tablet ?Commonly known as: LIPITOR ?Take 5 mg by mouth at bedtime. ?  ?Biofreeze 4 % Gel ?Generic drug: Menthol (Topical Analgesic) ?Apply topically 2 (two) times daily as needed. Apply thin layer to left thigh ?  ?buPROPion 150 MG 12 hr tablet ?Commonly known as: WELLBUTRIN SR ?Take 1 tablet (150 mg total) by mouth 2 (two) times daily after a meal. ?  ?calcium citrate-vitamin D 315-200 MG-UNIT tablet ?Commonly known as: CITRACAL+D ?Take 1 tablet by mouth 2 (two) times daily. ?  ?Camphor-Menthol-Methyl Sal 3.07-31-08 % Ptch ?Place 1 patch onto the skin See admin instructions. Apply one patch onto the skin of the  lower back daily for back pain ?  ?diclofenac Sodium 1 % Gel ?Commonly known as: VOLTAREN ?Apply 4 g topically in the morning and at bedtime. Apply to bilateral ankles BID ?  ?ferrous sulfate 325 (65 FE) MG  EC tablet ?Take 325 mg by mouth  every Monday, Wednesday, and Friday. With a meal ?  ?fish oil-omega-3 fatty acids 1000 MG capsule ?Take 1 g by mouth every morning. ?  ?gabapentin 100 MG capsule ?Commonly known as: NEURONTIN ?Take 100 mg by mouth 3 (three) times daily. ?  ?hydrocortisone cream 1 % ?Apply 1 application topically daily as needed for itching. ?  ?levothyroxine 75 MCG tablet ?Commonly known as: SYNTHROID ?Take 75 mcg by mouth every morning. ?  ?losartan 25 MG tablet ?Commonly known as: COZAAR ?Take 25 mg by mouth every morning. ?  ?nystatin powder ?Commonly known as: MYCOSTATIN/NYSTOP ?Apply 1 application topically 2 (two) times daily as needed (yeast). ?  ?omeprazole 40 MG capsule ?Commonly known as: PRILOSEC ?Take 40 mg by mouth every morning. ?  ?OxyCODONE HCl (Abuse Deter) 5 MG Taba ?Commonly known as: OXAYDO ?Take 2.5 mg by mouth 2 (two) times daily as needed. Give 30 minutes before transportation to ortho appointments. ?  ?polyethylene glycol 17 g packet ?Commonly known as: MIRALAX / GLYCOLAX ?Take 17 g by mouth daily as needed (constipation). ?  ?potassium chloride SA 20 MEQ tablet ?Commonly known as: KLOR-CON M ?Take 20 mEq by mouth daily. ?  ?senna 8.6 MG Tabs tablet ?Commonly known as: SENOKOT ?Take 2 tablets by mouth at bedtime. ?  ?sertraline 25 MG tablet ?Commonly known as: ZOLOFT ?Take 75 mg by mouth every morning. ?  ?torsemide 20 MG tablet ?Commonly known as: DEMADEX ?Take 20 mg by mouth every other day. ?  ?zinc oxide 20 % ointment ?Apply 1 application topically See admin instructions. Apply topically to per/buttocks after every incontinent episode and as needed for redness ?  ? ?  ? ? ?Review of Systems  ?Constitutional:  Positive for fatigue. Negative for appetite change and fever.  ?     Chronic fatigue.   ?HENT:  Positive for hearing loss. Negative for congestion and voice change.   ?Eyes:  Negative for visual disturbance.  ?Respiratory:  Positive for cough and shortness of breath.  Negative for wheezing.   ?     DOE  ?Cardiovascular:  Positive for leg swelling. Negative for palpitations.  ?Gastrointestinal:  Negative for abdominal pain and constipation.  ?Genitourinary:  Negative f

## 2021-10-06 NOTE — Assessment & Plan Note (Signed)
Stable, takes MiraLax, Senna. 

## 2021-10-06 NOTE — Assessment & Plan Note (Signed)
persisted SOB, feeling weak. 03/24/2018 echocardiogram showed mild diastolic dysfunction, EF 09-47%. CXR 10/02/21 showed mild CHF. Noted wet crackles posterior mid to lower lungs. Will increase Torsemide 57m +Kcl 274m po 5x/wk. Update echocardiogram, BNP, CMP/eGFR, CBC/diff.  ?

## 2021-10-06 NOTE — Assessment & Plan Note (Signed)
stable, off Omeprazole 

## 2021-10-06 NOTE — Assessment & Plan Note (Signed)
Bun/creat 26/1.2 eGFR 44 09/24/21 ?

## 2021-10-06 NOTE — Assessment & Plan Note (Signed)
Her mood is stable,  takes Sertraline, Bupropion, Alprazolam 

## 2021-10-06 NOTE — Assessment & Plan Note (Signed)
had work up but no biopsy 2/2 her age, takes Fe, stable. Hgb 8.3 09/24/21 ?

## 2021-10-06 NOTE — Assessment & Plan Note (Signed)
09/24/21 right upper lung PNA, treated with 7 day course of Levaquin.  ?10/06/21 CXR left lung base, right upper lung infiltrate, will treat with Doxycycline 100mg  bid x 7 days if HPOA the patient son declined ED ?

## 2021-10-06 NOTE — Assessment & Plan Note (Signed)
blood pressure is controlled on Losartan 

## 2021-10-06 NOTE — Assessment & Plan Note (Signed)
takes Atorvastatin, LDL 94 10/20/20 ?

## 2021-10-07 DIAGNOSIS — M6281 Muscle weakness (generalized): Secondary | ICD-10-CM | POA: Diagnosis not present

## 2021-10-07 DIAGNOSIS — S82851D Displaced trimalleolar fracture of right lower leg, subsequent encounter for closed fracture with routine healing: Secondary | ICD-10-CM | POA: Diagnosis not present

## 2021-10-07 DIAGNOSIS — M545 Low back pain, unspecified: Secondary | ICD-10-CM | POA: Diagnosis not present

## 2021-10-07 DIAGNOSIS — R2681 Unsteadiness on feet: Secondary | ICD-10-CM | POA: Diagnosis not present

## 2021-10-07 DIAGNOSIS — Z9181 History of falling: Secondary | ICD-10-CM | POA: Diagnosis not present

## 2021-10-08 DIAGNOSIS — E039 Hypothyroidism, unspecified: Secondary | ICD-10-CM | POA: Diagnosis not present

## 2021-10-08 DIAGNOSIS — I5031 Acute diastolic (congestive) heart failure: Secondary | ICD-10-CM | POA: Diagnosis not present

## 2021-10-08 DIAGNOSIS — D649 Anemia, unspecified: Secondary | ICD-10-CM | POA: Diagnosis not present

## 2021-10-08 DIAGNOSIS — R0609 Other forms of dyspnea: Secondary | ICD-10-CM | POA: Diagnosis not present

## 2021-10-08 DIAGNOSIS — I5189 Other ill-defined heart diseases: Secondary | ICD-10-CM | POA: Diagnosis not present

## 2021-10-08 DIAGNOSIS — I517 Cardiomegaly: Secondary | ICD-10-CM | POA: Diagnosis not present

## 2021-10-08 DIAGNOSIS — I10 Essential (primary) hypertension: Secondary | ICD-10-CM | POA: Diagnosis not present

## 2021-10-08 DIAGNOSIS — R600 Localized edema: Secondary | ICD-10-CM | POA: Diagnosis not present

## 2021-10-08 LAB — COMPREHENSIVE METABOLIC PANEL
Albumin: 3.2 — AB (ref 3.5–5.0)
Calcium: 8.9 (ref 8.7–10.7)
Globulin: 2.9

## 2021-10-08 LAB — HEPATIC FUNCTION PANEL
ALT: 7 U/L (ref 7–35)
AST: 16 (ref 13–35)
Alkaline Phosphatase: 81 (ref 25–125)
Bilirubin, Total: 0.2

## 2021-10-08 LAB — CBC AND DIFFERENTIAL
HCT: 26 — AB (ref 36–46)
Hemoglobin: 8 — AB (ref 12.0–16.0)
Platelets: 122 10*3/uL — AB (ref 150–400)
WBC: 11

## 2021-10-08 LAB — BASIC METABOLIC PANEL
BUN: 16 (ref 4–21)
CO2: 36 — AB (ref 13–22)
Chloride: 99 (ref 99–108)
Creatinine: 0.9 (ref 0.5–1.1)
Glucose: 74
Potassium: 4.4 mEq/L (ref 3.5–5.1)
Sodium: 137 (ref 137–147)

## 2021-10-08 LAB — CBC: RBC: 2.99 — AB (ref 3.87–5.11)

## 2021-10-08 NOTE — Progress Notes (Signed)
This encounter was created in error - please disregard.

## 2021-10-09 ENCOUNTER — Encounter: Payer: Self-pay | Admitting: Nurse Practitioner

## 2021-10-09 ENCOUNTER — Non-Acute Institutional Stay (SKILLED_NURSING_FACILITY): Payer: Medicare Other | Admitting: Orthopedic Surgery

## 2021-10-09 ENCOUNTER — Encounter: Payer: Self-pay | Admitting: Orthopedic Surgery

## 2021-10-09 DIAGNOSIS — R630 Anorexia: Secondary | ICD-10-CM

## 2021-10-09 DIAGNOSIS — I5189 Other ill-defined heart diseases: Secondary | ICD-10-CM | POA: Diagnosis not present

## 2021-10-09 DIAGNOSIS — J189 Pneumonia, unspecified organism: Secondary | ICD-10-CM | POA: Diagnosis not present

## 2021-10-09 DIAGNOSIS — L89626 Pressure-induced deep tissue damage of left heel: Secondary | ICD-10-CM

## 2021-10-09 NOTE — Progress Notes (Deleted)
?Location:    ?[facility] Home Room Number: 35 ?Place of Service:  SNF (31) ?Provider:  Windell Moulding, NP ? ?[name] Dad, MD ? ?Patient Care Team: ?[name] Dad, MD as PCP - General (Internal Medicine) ?Mast, Man X, NP as Nurse Practitioner (Internal Medicine) ? ?Extended Emergency Contact Information ?Primary Emergency Contact: Roldan,[name] ?Address: [address] ?         [place], [place] [address] [place] ?Home Phone: [phone] ?Mobile Phone: [phone] ?Relation: Son ?Secondary Emergency Contact: Lollar, Dino ? [place] ?Home Phone: [phone] ?Mobile Phone: [phone] ?Relation: Other ? ?Code Status:  DNR ?Goals of care: Advanced Directive information ?Advanced Directives 10/09/2021  ?Does Patient Have a Medical Advance Directive? Yes  ?Type of Paramedic of [place];Out of facility DNR (pink MOST or yellow form)  ?Does patient want to make changes to medical advance directive? No - Patient declined  ?Copy of [place] in Chart? Yes - validated most recent copy scanned in chart (See row information)  ?Pre-existing out of facility DNR order (yellow form or pink MOST form) Yellow form placed in chart (order not valid for inpatient use)  ? ? ? ?Chief Complaint  ?Patient presents with  ? Acute Visit  ?  Left foot pain  ? ? ?HPI:  ?Pt is a 88 y.o. female seen today for medical management of chronic diseases.   ? ? ?Past Medical History:  ?Diagnosis Date  ? Anemia   ? Anxiety   ? Chronotropic incompetence 12/2012  ?  potentially medication related; noted on CPET   ? DDD (degenerative disc disease)   ? Eczema   ? GERD (gastroesophageal reflux disease)   ? H/O hiatal hernia   ? Hx: UTI (urinary tract infection)   ? Hyperlipidemia   ? Hypertension   ? Hypothyroidism   ? Osteopenia   ? Septicemia ([place]) 2002  ? following UTI  ? Vertigo, benign positional   ? ?Past Surgical History:  ?Procedure Laterality Date  ? BREAST SURGERY    ? left biopsy  ?  CATARACT EXTRACTION Right   ? 2 weeks ago  ? CPET / MET - PFTS    ? Consistent with chronotropic incompetence; See attached report in the results section  ? DILATION AND CURETTAGE OF UTERUS    ? x 2  ? DOPPLER ECHOCARDIOGRAPHY  01/10/2013  ? Normal LV size and function. Normal EF. Air sclerosis but no stenosis  ? ESOPHAGOGASTRODUODENOSCOPY  05/04/2012  ? Procedure: ESOPHAGOGASTRODUODENOSCOPY (EGD);  Surgeon: Inda Castle, MD;  Location: [facility];  Service: Endoscopy;  Laterality: N/A;  ? EYE SURGERY    ? cataract extraction with ILO  ? eye  ? IR KYPHO EA ADDL LEVEL THORACIC OR LUMBAR  10/28/2016  ? IR KYPHO LUMBAR INC FX REDUCE BONE BX UNI/BIL CANNULATION INC/IMAGING  10/28/2016  ? IR KYPHO THORACIC WITH BONE BIOPSY  12/24/2016  ? IR RADIOLOGIST EVAL & MGMT  11/11/2016  ? KNEE ARTHROSCOPY  2011  ? right  ? ORIF ANKLE FRACTURE Right 03/27/2021  ? Procedure: OPEN REDUCTION INTERNAL FIXATION (ORIF) ANKLE FRACTURE;  Surgeon: Marchia Bond, MD;  Location: [facility];  Service: Orthopedics;  Laterality: Right;  ? TONSILLECTOMY    ? TOTAL KNEE ARTHROPLASTY  04/24/2012  ? Procedure: TOTAL KNEE ARTHROPLASTY;  Surgeon: Gearlean Alf, MD;  Location: [facility];  Service: Orthopedics;  Laterality: Right;  ? TRANSTHORACIC ECHOCARDIOGRAM  02/2018  ? Ordered by PCP for "congestive heart  failure " -EF 60 M 65%.  GR 1 DD.  No R WMA--- > ESSENTIALLY NORMAL  ? ? ?No Known Allergies ? ?Allergies as of 10/09/2021   ?No Known Allergies ?  ? ?  ?Medication List  ?  ? ?  ? Accurate as of October 09, 2021  1:40 PM. If you have any questions, ask your nurse or doctor.  ?  ?  ? ?  ? ?acetaminophen 500 MG tablet ?Commonly known as: TYLENOL ?Take 1,000 mg by mouth every 8 (eight) hours. For pain ?  ?Albuterol Sulfate 2.5 MG/0.5ML Nebu ?Inhale into the lungs every 6 (six) hours as needed (As needed for wheezing or shortness of breath). ?  ?ALPRAZolam 0.25 MG tablet ?Commonly known as: Duanne Moron ?Take 1 tablet (0.25 mg total) by mouth daily. ?  ?ARTIFICIAL  TEARS OP ?Place 1 drop into both eyes in the morning, at noon, and at bedtime. ?  ?atorvastatin 10 MG tablet ?Commonly known as: LIPITOR ?Take 5 mg by mouth at bedtime. ?  ?Biofreeze 4 % Gel ?Generic drug: Menthol (Topical Analgesic) ?Apply topically 2 (two) times daily as needed. Apply thin layer to left thigh ?  ?buPROPion 150 MG 12 hr tablet ?Commonly known as: WELLBUTRIN SR ?Take 1 tablet (150 mg total) by mouth 2 (two) times daily after a meal. ?  ?calcium citrate-vitamin D 315-200 MG-UNIT tablet ?Commonly known as: CITRACAL+D ?Take 1 tablet by mouth 2 (two) times daily. ?  ?Camphor-Menthol-Methyl Sal 3.07-31-08 % Ptch ?Place 1 patch onto the skin See admin instructions. Apply one patch onto the skin of the  lower back daily for back pain ?  ?diclofenac Sodium 1 % Gel ?Commonly known as: VOLTAREN ?Apply 4 g topically in the morning and at bedtime. Apply to bilateral ankles BID ?  ?DOXYCYCLINE HYCLATE PO ?Take 100 mg by mouth 2 (two) times daily. ?  ?ferrous sulfate 325 (65 FE) MG EC tablet ?Take 325 mg by mouth every Monday, Wednesday, and Friday. With a meal ?  ?fish oil-omega-3 fatty acids 1000 MG capsule ?Take 1 g by mouth every morning. ?  ?gabapentin 100 MG capsule ?Commonly known as: NEURONTIN ?Take 100 mg by mouth 3 (three) times daily. ?  ?hydrocortisone cream 1 % ?Apply 1 application topically daily as needed for itching. ?  ?levothyroxine 75 MCG tablet ?Commonly known as: SYNTHROID ?Take 75 mcg by mouth every morning. ?  ?losartan 25 MG tablet ?Commonly known as: COZAAR ?Take 25 mg by mouth every morning. ?  ?nystatin powder ?Commonly known as: MYCOSTATIN/NYSTOP ?Apply 1 application topically 2 (two) times daily as needed (yeast). ?  ?omeprazole 40 MG capsule ?Commonly known as: PRILOSEC ?Take 40 mg by mouth every morning. ?  ?OxyCODONE HCl (Abuse Deter) 5 MG Taba ?Commonly known as: OXAYDO ?Take 2.5 mg by mouth 2 (two) times daily as needed. Give 30 minutes before transportation to ortho  appointments. ?  ?polyethylene glycol 17 g packet ?Commonly known as: MIRALAX / GLYCOLAX ?Take 17 g by mouth daily as needed (constipation). ?  ?potassium chloride SA 20 MEQ tablet ?Commonly known as: KLOR-CON M ?Take 20 mEq by mouth daily. ?  ?saccharomyces boulardii 250 MG capsule ?Commonly known as: FLORASTOR ?Take 250 mg by mouth 2 (two) times daily. ?  ?senna 8.6 MG Tabs tablet ?Commonly known as: SENOKOT ?Take 2 tablets by mouth at bedtime. ?  ?sertraline 25 MG tablet ?Commonly known as: ZOLOFT ?Take 75 mg by mouth every morning. ?  ?torsemide 20 MG tablet ?Commonly known  as: DEMADEX ?Take 20 mg by mouth daily. Once A Day on Mon, Tue, Wed, Thu, Fri ?  ?zinc oxide 20 % ointment ?Apply 1 application topically See admin instructions. Apply topically to per/buttocks after every incontinent episode and as needed for redness ?  ? ?  ? ? ?Review of Systems ? ?Immunization History  ?Administered Date(s) Administered  ? Influenza, High Dose Seasonal PF 04/26/2016, 03/22/2017, 05/08/2019  ? Influenza,inj,Quad PF,6+ Mos 04/27/2018  ? Influenza-Unspecified 04/22/2011, 04/04/2012, 05/03/2013, 05/04/2013, 04/17/2014, 04/19/2015, 04/26/2016, 04/22/2017, 05/20/2021  ? Moderna SARS-COV2 Booster Vaccination 06/09/2020, 12/31/2020  ? Moderna Sars-Covid-2 Vaccination 07/30/2019, 08/27/2019  ? Pension scheme manager 67yr & up 04/15/2021  ? Pneumococcal Polysaccharide-23 08/26/2003, 09/06/2011, 05/15/2019  ? Pneumococcal-Unspecified 12/20/2013  ? Td 09/26/2006  ? Tdap 05/15/2019  ? Zoster Recombinat (Shingrix) 11/21/2017, 04/08/2018  ? Zoster, Live 10/25/2007  ? ?Pertinent  Health Maintenance Due  ?Topic Date Due  ? INFLUENZA VACCINE  Completed  ? DEXA SCAN  Completed  ? ?Fall Risk 03/31/2021 03/31/2021 03/31/2021 04/04/2021 09/18/2021  ?Falls in the past year? - - - - 1  ?Was there an injury with Fall? - - - - 1  ?Fall Risk Category Calculator - - - - 3  ?Fall Risk Category - - - - High  ?Patient Fall Risk Level High  fall risk High fall risk High fall risk High fall risk High fall risk  ?Patient at Risk for Falls Due to - - - - History of fall(s);Impaired balance/gait;Impaired mobility;Orthopedic patient  ?Fall risk Follow up - - - -

## 2021-10-09 NOTE — Progress Notes (Signed)
?Location:   [facility] Room Number: 35 ?Place of Service:  SNF (31) ?Provider:  Windell Moulding, NP ? ?Virgie Dad, MD ? ?Patient Care Team: ?Virgie Dad, MD as PCP - General (Internal Medicine) ?Mast, Man X, NP as Nurse Practitioner (Internal Medicine) ? ?Extended Emergency Contact Information ?Primary Emergency Contact: Coltrain,[name] ?Address: [address] ?         [place], [place] [address] [place] ?Home Phone: [phone] ?Mobile Phone: [phone] ?Relation: Son ?Secondary Emergency Contact: Ivy, Dino ? [place] ?Home Phone: [phone] ?Mobile Phone: [phone] ?Relation: Other ? ?Code Status:  DNR ?Goals of care: Advanced Directive information ?Advanced Directives 10/09/2021  ?Does Patient Have a Medical Advance Directive? Yes  ?Type of Paramedic of [place];Out of facility DNR (pink MOST or yellow form)  ?Does patient want to make changes to medical advance directive? No - Patient declined  ?Copy of [place] in Chart? Yes - validated most recent copy scanned in chart (See row information)  ?Pre-existing out of facility DNR order (yellow form or pink MOST form) Yellow form placed in chart (order not valid for inpatient use)  ? ? ? ?Chief Complaint  ?Patient presents with  ? Acute Visit  ?  Left foot pain  ? ? ?HPI:  ?Pt is a 88 y.o. female seen today for an acute visit for left heel pain.  ? ?This morning she c/o left heel pain. Pain described as burning. H/o deep tissue injury to left heel. Heel covered with foam dressing. Podis booties on while in bed. Receiving Boost shakes daily. She is given scheduled tylenol 1000 mg TID, just received first dose.  ? ?Pneumonia- 03/02 CXR revealed right upper lung PNA, given levaquin x 7 days, repeat CXR 03/14 revealed left lung base infiltrate, remains on doxycycline x 7 days, currently on 2 liters oxygen, denies sob and cough, reports feeling better after starting  doxycycline, HPOA wants to avoid ED/hospitalization at this time ?Diastolic dysfunction- CXR indicated mild CHF 03/10, torsemide increased to 20 meq 5x/week, echocardiogram completed 03/16- results pending, BNP/cbc/diff/cmp- pending ? ?Appetite remains poor, son brought milkshake today, she drank all of it.  ? ?Past Medical History:  ?Diagnosis Date  ? Anemia   ? Anxiety   ? Chronotropic incompetence 12/2012  ?  potentially medication related; noted on CPET   ? DDD (degenerative disc disease)   ? Eczema   ? GERD (gastroesophageal reflux disease)   ? H/O hiatal hernia   ? Hx: UTI (urinary tract infection)   ? Hyperlipidemia   ? Hypertension   ? Hypothyroidism   ? Osteopenia   ? Septicemia ([place]) 2002  ? following UTI  ? Vertigo, benign positional   ? ?Past Surgical History:  ?Procedure Laterality Date  ? BREAST SURGERY    ? left biopsy  ? CATARACT EXTRACTION Right   ? 2 weeks ago  ? CPET / MET - PFTS    ? Consistent with chronotropic incompetence; See attached report in the results section  ? DILATION AND CURETTAGE OF UTERUS    ? x 2  ? DOPPLER ECHOCARDIOGRAPHY  01/10/2013  ? Normal LV size and function. Normal EF. Air sclerosis but no stenosis  ? ESOPHAGOGASTRODUODENOSCOPY  05/04/2012  ? Procedure: ESOPHAGOGASTRODUODENOSCOPY (EGD);  Surgeon: Inda Castle, MD;  Location: [facility];  Service: Endoscopy;  Laterality: N/A;  ? EYE SURGERY    ? cataract extraction with ILO  ? eye  ?  IR KYPHO EA ADDL LEVEL THORACIC OR LUMBAR  10/28/2016  ? IR KYPHO LUMBAR INC FX REDUCE BONE BX UNI/BIL CANNULATION INC/IMAGING  10/28/2016  ? IR KYPHO THORACIC WITH BONE BIOPSY  12/24/2016  ? IR RADIOLOGIST EVAL & MGMT  11/11/2016  ? KNEE ARTHROSCOPY  2011  ? right  ? ORIF ANKLE FRACTURE Right 03/27/2021  ? Procedure: OPEN REDUCTION INTERNAL FIXATION (ORIF) ANKLE FRACTURE;  Surgeon: Marchia Bond, MD;  Location: [facility];  Service: Orthopedics;  Laterality: Right;  ? TONSILLECTOMY    ? TOTAL KNEE ARTHROPLASTY  04/24/2012  ? Procedure: TOTAL KNEE  ARTHROPLASTY;  Surgeon: Gearlean Alf, MD;  Location: [facility];  Service: Orthopedics;  Laterality: Right;  ? TRANSTHORACIC ECHOCARDIOGRAM  02/2018  ? Ordered by PCP for "congestive heart failure " -EF 60 M 65%.  GR 1 DD.  No R WMA--- > ESSENTIALLY NORMAL  ? ? ?No Known Allergies ? ?Allergies as of 10/09/2021   ?No Known Allergies ?  ? ?  ?Medication List  ?  ? ?  ? Accurate as of October 09, 2021  1:46 PM. If you have any questions, ask your nurse or doctor.  ?  ?  ? ?  ? ?acetaminophen 500 MG tablet ?Commonly known as: TYLENOL ?Take 1,000 mg by mouth every 8 (eight) hours. For pain ?  ?Albuterol Sulfate 2.5 MG/0.5ML Nebu ?Inhale into the lungs every 6 (six) hours as needed (As needed for wheezing or shortness of breath). ?  ?ALPRAZolam 0.25 MG tablet ?Commonly known as: Duanne Moron ?Take 1 tablet (0.25 mg total) by mouth daily. ?  ?ARTIFICIAL TEARS OP ?Place 1 drop into both eyes in the morning, at noon, and at bedtime. ?  ?atorvastatin 10 MG tablet ?Commonly known as: LIPITOR ?Take 5 mg by mouth at bedtime. ?  ?Biofreeze 4 % Gel ?Generic drug: Menthol (Topical Analgesic) ?Apply topically 2 (two) times daily as needed. Apply thin layer to left thigh ?  ?buPROPion 150 MG 12 hr tablet ?Commonly known as: WELLBUTRIN SR ?Take 1 tablet (150 mg total) by mouth 2 (two) times daily after a meal. ?  ?calcium citrate-vitamin D 315-200 MG-UNIT tablet ?Commonly known as: CITRACAL+D ?Take 1 tablet by mouth 2 (two) times daily. ?  ?Camphor-Menthol-Methyl Sal 3.07-31-08 % Ptch ?Place 1 patch onto the skin See admin instructions. Apply one patch onto the skin of the  lower back daily for back pain ?  ?diclofenac Sodium 1 % Gel ?Commonly known as: VOLTAREN ?Apply 4 g topically in the morning and at bedtime. Apply to bilateral ankles BID ?  ?DOXYCYCLINE HYCLATE PO ?Take 100 mg by mouth 2 (two) times daily. ?  ?ferrous sulfate 325 (65 FE) MG EC tablet ?Take 325 mg by mouth every Monday, Wednesday, and Friday. With a meal ?  ?fish oil-omega-3  fatty acids 1000 MG capsule ?Take 1 g by mouth every morning. ?  ?gabapentin 100 MG capsule ?Commonly known as: NEURONTIN ?Take 100 mg by mouth 3 (three) times daily. ?  ?hydrocortisone cream 1 % ?Apply 1 application topically daily as needed for itching. ?  ?levothyroxine 75 MCG tablet ?Commonly known as: SYNTHROID ?Take 75 mcg by mouth every morning. ?  ?losartan 25 MG tablet ?Commonly known as: COZAAR ?Take 25 mg by mouth every morning. ?  ?nystatin powder ?Commonly known as: MYCOSTATIN/NYSTOP ?Apply 1 application topically 2 (two) times daily as needed (yeast). ?  ?omeprazole 40 MG capsule ?Commonly known as: PRILOSEC ?Take 40 mg by mouth every morning. ?  ?OxyCODONE  HCl (Abuse Deter) 5 MG Taba ?Commonly known as: OXAYDO ?Take 2.5 mg by mouth 2 (two) times daily as needed. Give 30 minutes before transportation to ortho appointments. ?  ?polyethylene glycol 17 g packet ?Commonly known as: MIRALAX / GLYCOLAX ?Take 17 g by mouth daily as needed (constipation). ?  ?potassium chloride SA 20 MEQ tablet ?Commonly known as: KLOR-CON M ?Take 20 mEq by mouth daily. ?  ?saccharomyces boulardii 250 MG capsule ?Commonly known as: FLORASTOR ?Take 250 mg by mouth 2 (two) times daily. ?  ?senna 8.6 MG Tabs tablet ?Commonly known as: SENOKOT ?Take 2 tablets by mouth at bedtime. ?  ?sertraline 25 MG tablet ?Commonly known as: ZOLOFT ?Take 75 mg by mouth every morning. ?  ?torsemide 20 MG tablet ?Commonly known as: DEMADEX ?Take 20 mg by mouth daily. Once A Day on Mon, Tue, Wed, Thu, Fri ?  ?zinc oxide 20 % ointment ?Apply 1 application topically See admin instructions. Apply topically to per/buttocks after every incontinent episode and as needed for redness ?  ? ?  ? ? ?Review of Systems  ?Constitutional:  Positive for fatigue. Negative for activity change, appetite change, chills, diaphoresis and fever.  ?HENT:  Positive for hearing loss. Negative for trouble swallowing.   ?Eyes:  Negative for visual disturbance.  ?Respiratory:   Negative for cough, shortness of breath and wheezing.   ?Cardiovascular:  Negative for chest pain and leg swelling.  ?Gastrointestinal:  Positive for constipation. Negative for abdominal distention, abdominal

## 2021-10-12 ENCOUNTER — Other Ambulatory Visit: Payer: Self-pay

## 2021-10-12 MED ORDER — ALPRAZOLAM 0.25 MG PO TABS: 0.2500 mg | ORAL_TABLET | Freq: Every day | ORAL | 5 refills | Status: DC

## 2021-10-12 NOTE — Telephone Encounter (Signed)
Refill request received from [facility] requesting a refill on a skilled resident at [facility]. ? ?Request is for Alprazolam 0.25 mg, take 1 by mouth twice daily as needed for anxiety which differs from current medication list ? ?Patient was recently seen 10/09/2021  ? ?Please advise  ?

## 2021-10-13 DIAGNOSIS — M6281 Muscle weakness (generalized): Secondary | ICD-10-CM | POA: Diagnosis not present

## 2021-10-13 DIAGNOSIS — Z9181 History of falling: Secondary | ICD-10-CM | POA: Diagnosis not present

## 2021-10-13 DIAGNOSIS — S82851D Displaced trimalleolar fracture of right lower leg, subsequent encounter for closed fracture with routine healing: Secondary | ICD-10-CM | POA: Diagnosis not present

## 2021-10-14 DIAGNOSIS — Z9181 History of falling: Secondary | ICD-10-CM | POA: Diagnosis not present

## 2021-10-14 DIAGNOSIS — S82851D Displaced trimalleolar fracture of right lower leg, subsequent encounter for closed fracture with routine healing: Secondary | ICD-10-CM | POA: Diagnosis not present

## 2021-10-14 DIAGNOSIS — R2681 Unsteadiness on feet: Secondary | ICD-10-CM | POA: Diagnosis not present

## 2021-10-14 DIAGNOSIS — M6281 Muscle weakness (generalized): Secondary | ICD-10-CM | POA: Diagnosis not present

## 2021-10-14 DIAGNOSIS — M545 Low back pain, unspecified: Secondary | ICD-10-CM | POA: Diagnosis not present

## 2021-10-15 ENCOUNTER — Non-Acute Institutional Stay (SKILLED_NURSING_FACILITY): Payer: Medicare Other | Admitting: Internal Medicine

## 2021-10-15 ENCOUNTER — Encounter: Payer: Self-pay | Admitting: Internal Medicine

## 2021-10-15 DIAGNOSIS — M545 Low back pain, unspecified: Secondary | ICD-10-CM | POA: Diagnosis not present

## 2021-10-15 DIAGNOSIS — D638 Anemia in other chronic diseases classified elsewhere: Secondary | ICD-10-CM | POA: Diagnosis not present

## 2021-10-15 DIAGNOSIS — J189 Pneumonia, unspecified organism: Secondary | ICD-10-CM

## 2021-10-15 DIAGNOSIS — I5189 Other ill-defined heart diseases: Secondary | ICD-10-CM

## 2021-10-15 DIAGNOSIS — G8929 Other chronic pain: Secondary | ICD-10-CM | POA: Diagnosis not present

## 2021-10-15 DIAGNOSIS — I10 Essential (primary) hypertension: Secondary | ICD-10-CM

## 2021-10-15 DIAGNOSIS — E039 Hypothyroidism, unspecified: Secondary | ICD-10-CM | POA: Diagnosis not present

## 2021-10-15 DIAGNOSIS — K219 Gastro-esophageal reflux disease without esophagitis: Secondary | ICD-10-CM

## 2021-10-15 DIAGNOSIS — N1831 Chronic kidney disease, stage 3a: Secondary | ICD-10-CM | POA: Diagnosis not present

## 2021-10-15 DIAGNOSIS — E785 Hyperlipidemia, unspecified: Secondary | ICD-10-CM | POA: Diagnosis not present

## 2021-10-15 DIAGNOSIS — F339 Major depressive disorder, recurrent, unspecified: Secondary | ICD-10-CM

## 2021-10-15 DIAGNOSIS — R630 Anorexia: Secondary | ICD-10-CM

## 2021-10-15 NOTE — Progress Notes (Signed)
? ?Location: [facility] Room Number: 35 ?Place of Service:  SNF (31) ? ?Provider:  ? ?Code Status: DNR ?Goals of Care:  ? ?  10/16/2021  ? 10:23 AM  ?Advanced Directives  ?Does Patient Have a Medical Advance Directive? Yes  ?Type of Paramedic of [place];Out of facility DNR (pink MOST or yellow form)  ?Does patient want to make changes to medical advance directive? No - Patient declined  ?Copy of [place] in Chart? Yes - validated most recent copy scanned in chart (See row information)  ?Pre-existing out of facility DNR order (yellow form or pink MOST form) Yellow form placed in chart (order not valid for inpatient use)  ? ? ? ?Chief Complaint  ?Patient presents with  ? Acute Visit  ? ? ?HPI: Patient is a 88 y.o. female seen today for an acute visit for Follow up as requested by her son ? ?Patient has h/o Pancytopenia , h/o Chronic Back Pain, Hypertension, Hypothyroidism, Hypertension, Recurrent Cystitis, Osteoporosis and Major Depression  ?Admitted in the hospital From 9/1-9/7 for Right Trimalleolar Fracture. S/P ORIF ?  ?Since then she has been in SNF ?Diagnised with RUL infilterate on 03/02 Treated with Levaquin ?Not better repeat Xray showed Bilateral Infiterate ?Doxycyline for 7 days ?Still son thinks she stays lethargic Weak  ?No Fever. Stays on Oxygen. No Coughing or chest pain ? ?She looked more alert today still sleeping a lot ?Denied any acute complains. Poor appetite and wanted to know if she can have Boost  ?No Dysphagia ?Son does not want Hospital visit ?Wants to continue with Palliative approach here  ?.l ? ?Past Medical History:  ?Diagnosis Date  ? Anemia   ? Anxiety   ? Chronotropic incompetence 12/2012  ?  potentially medication related; noted on CPET   ? DDD (degenerative disc disease)   ? Eczema   ? GERD (gastroesophageal reflux disease)   ? H/O hiatal hernia   ? Hx: UTI (urinary tract infection)   ? Hyperlipidemia   ? Hypertension    ? Hypothyroidism   ? Osteopenia   ? Septicemia ([place]) 2002  ? following UTI  ? Vertigo, benign positional   ? ? ?Past Surgical History:  ?Procedure Laterality Date  ? BREAST SURGERY    ? left biopsy  ? CATARACT EXTRACTION Right   ? 2 weeks ago  ? CPET / MET - PFTS    ? Consistent with chronotropic incompetence; See attached report in the results section  ? DILATION AND CURETTAGE OF UTERUS    ? x 2  ? DOPPLER ECHOCARDIOGRAPHY  01/10/2013  ? Normal LV size and function. Normal EF. Air sclerosis but no stenosis  ? ESOPHAGOGASTRODUODENOSCOPY  05/04/2012  ? Procedure: ESOPHAGOGASTRODUODENOSCOPY (EGD);  Surgeon: Inda Castle, MD;  Location: [facility];  Service: Endoscopy;  Laterality: N/A;  ? EYE SURGERY    ? cataract extraction with ILO  ? eye  ? IR KYPHO EA ADDL LEVEL THORACIC OR LUMBAR  10/28/2016  ? IR KYPHO LUMBAR INC FX REDUCE BONE BX UNI/BIL CANNULATION INC/IMAGING  10/28/2016  ? IR KYPHO THORACIC WITH BONE BIOPSY  12/24/2016  ? IR RADIOLOGIST EVAL & MGMT  11/11/2016  ? KNEE ARTHROSCOPY  2011  ? right  ? ORIF ANKLE FRACTURE Right 03/27/2021  ? Procedure: OPEN REDUCTION INTERNAL FIXATION (ORIF) ANKLE FRACTURE;  Surgeon: Marchia Bond, MD;  Location: [facility];  Service: Orthopedics;  Laterality: Right;  ? TONSILLECTOMY    ? TOTAL  KNEE ARTHROPLASTY  04/24/2012  ? Procedure: TOTAL KNEE ARTHROPLASTY;  Surgeon: Gearlean Alf, MD;  Location: [facility];  Service: Orthopedics;  Laterality: Right;  ? TRANSTHORACIC ECHOCARDIOGRAM  02/2018  ? Ordered by PCP for "congestive heart failure " -EF 60 M 65%.  GR 1 DD.  No R WMA--- > ESSENTIALLY NORMAL  ? ? ?No Known Allergies ? ?Outpatient Encounter Medications as of 10/15/2021  ?Medication Sig  ? acetaminophen (TYLENOL) 500 MG tablet Take 1,000 mg by mouth every 8 (eight) hours. For pain  ? Albuterol Sulfate 2.5 MG/0.5ML NEBU Inhale into the lungs every 6 (six) hours as needed (As needed for wheezing or shortness of breath).  ? ALPRAZolam (XANAX) 0.25 MG tablet Take 1 tablet (0.25 mg  total) by mouth daily. (Patient taking differently: Take 0.25 mg by mouth 2 (two) times daily as needed.)  ? atorvastatin (LIPITOR) 10 MG tablet Take 5 mg by mouth at bedtime.  ? buPROPion (WELLBUTRIN SR) 150 MG 12 hr tablet Take 1 tablet (150 mg total) by mouth 2 (two) times daily after a meal.  ? calcium citrate-vitamin D (CITRACAL+D) 315-200 MG-UNIT tablet Take 1 tablet by mouth 2 (two) times daily.  ? Camphor-Menthol-Methyl Sal 3.07-31-08 % PTCH Place 1 patch onto the skin See admin instructions. Apply one patch onto the skin of the  lower back daily for back pain  ? Carboxymethylcellulose Sodium (ARTIFICIAL TEARS OP) Place 1 drop into both eyes in the morning, at noon, and at bedtime.  ? diclofenac Sodium (VOLTAREN) 1 % GEL Apply 4 g topically in the morning and at bedtime. Apply to bilateral ankles BID  ? ferrous sulfate 325 (65 FE) MG EC tablet Take 325 mg by mouth every Monday, Wednesday, and Friday. With a meal  ? fish oil-omega-3 fatty acids 1000 MG capsule Take 1 g by mouth every morning.  ? gabapentin (NEURONTIN) 100 MG capsule Take 100 mg by mouth 3 (three) times daily.  ? hydrocortisone cream 1 % Apply 1 application topically daily as needed for itching.  ? levothyroxine (SYNTHROID) 75 MCG tablet Take 75 mcg by mouth every morning.  ? losartan (COZAAR) 25 MG tablet Take 25 mg by mouth every morning.  ? Menthol, Topical Analgesic, (BIOFREEZE) 4 % GEL Apply topically 2 (two) times daily as needed. Apply thin layer to left thigh  ? nystatin (MYCOSTATIN/NYSTOP) powder Apply 1 application topically 2 (two) times daily as needed (yeast).  ? omeprazole (PRILOSEC) 40 MG capsule Take 40 mg by mouth every morning.  ? OxyCODONE HCl, Abuse Deter, (OXAYDO) 5 MG TABA Take 2.5 mg by mouth 2 (two) times daily as needed. Give 30 minutes before transportation to ortho appointments.  ? polyethylene glycol (MIRALAX / GLYCOLAX) 17 g packet Take 17 g by mouth daily as needed (constipation).  ? potassium chloride SA (KLOR-CON  M) 20 MEQ tablet Take 20 mEq by mouth daily.  ? senna (SENOKOT) 8.6 MG TABS tablet Take 2 tablets by mouth at bedtime.  ? sertraline (ZOLOFT) 25 MG tablet Take 75 mg by mouth every morning.  ? torsemide (DEMADEX) 20 MG tablet Take 20 mg by mouth daily. Once A Day on Mon, Tue, Wed, Thu, Fri  ? zinc oxide 20 % ointment Apply 1 application topically See admin instructions. Apply topically to per/buttocks after every incontinent episode and as needed for redness  ? [DISCONTINUED] DOXYCYCLINE HYCLATE PO Take 100 mg by mouth 2 (two) times daily.  ? [DISCONTINUED] saccharomyces boulardii (FLORASTOR) 250 MG capsule Take 250 mg  by mouth 2 (two) times daily.  ? ?No facility-administered encounter medications on file as of 10/15/2021.  ? ? ?Review of Systems:  ?Review of Systems  ?Constitutional:  Positive for activity change and appetite change.  ?HENT: Negative.    ?Respiratory:  Negative for cough and shortness of breath.   ?Cardiovascular:  Negative for leg swelling.  ?Gastrointestinal:  Negative for constipation.  ?Genitourinary: Negative.   ?Musculoskeletal:  Positive for arthralgias, gait problem and myalgias.  ?Skin: Negative.   ?Neurological:  Positive for weakness. Negative for dizziness.  ?Psychiatric/Behavioral:  Negative for confusion, dysphoric mood and sleep disturbance. The patient is nervous/anxious.   ? ?Health Maintenance  ?Topic Date Due  ? Pneumonia Vaccine 5+ Years old (2 - PCV) 05/14/2020  ? COVID-19 Vaccine (4 - Booster) 06/10/2021  ? TETANUS/TDAP  05/14/2029  ? INFLUENZA VACCINE  Completed  ? DEXA SCAN  Completed  ? Zoster Vaccines- Shingrix  Completed  ? HPV VACCINES  Aged Out  ? ? ?Physical Exam: ?Vitals:  ? 10/15/21 1442  ?BP: 136/70  ?Pulse: 63  ?Resp: 16  ?Temp: 97.8 ?F (36.6 ?C)  ?SpO2: 97%  ?Weight: 169 lb 8 oz (76.9 kg)  ?Height: '5\' 4"'  (1.626 m)  ? ?Body mass index is 29.09 kg/m?Marland Kitchen ?Physical Exam ?Vitals reviewed.  ?Constitutional:   ?   Appearance: Normal appearance.  ?HENT:  ?   Head:  Normocephalic.  ?   Nose: Nose normal.  ?   Mouth/Throat:  ?   Mouth: Mucous membranes are moist.  ?   Pharynx: Oropharynx is clear.  ?Eyes:  ?   Pupils: Pupils are equal, round, and reactive to light.  ?[place]

## 2021-10-16 ENCOUNTER — Encounter: Payer: Self-pay | Admitting: Orthopedic Surgery

## 2021-10-16 ENCOUNTER — Non-Acute Institutional Stay (SKILLED_NURSING_FACILITY): Payer: Medicare Other | Admitting: Orthopedic Surgery

## 2021-10-16 DIAGNOSIS — I5189 Other ill-defined heart diseases: Secondary | ICD-10-CM

## 2021-10-16 DIAGNOSIS — I10 Essential (primary) hypertension: Secondary | ICD-10-CM | POA: Diagnosis not present

## 2021-10-16 DIAGNOSIS — K219 Gastro-esophageal reflux disease without esophagitis: Secondary | ICD-10-CM

## 2021-10-16 DIAGNOSIS — E039 Hypothyroidism, unspecified: Secondary | ICD-10-CM

## 2021-10-16 DIAGNOSIS — S82851D Displaced trimalleolar fracture of right lower leg, subsequent encounter for closed fracture with routine healing: Secondary | ICD-10-CM

## 2021-10-16 DIAGNOSIS — K5909 Other constipation: Secondary | ICD-10-CM

## 2021-10-16 DIAGNOSIS — R600 Localized edema: Secondary | ICD-10-CM | POA: Diagnosis not present

## 2021-10-16 DIAGNOSIS — R531 Weakness: Secondary | ICD-10-CM | POA: Diagnosis not present

## 2021-10-16 DIAGNOSIS — M545 Low back pain, unspecified: Secondary | ICD-10-CM

## 2021-10-16 DIAGNOSIS — R413 Other amnesia: Secondary | ICD-10-CM

## 2021-10-16 DIAGNOSIS — D638 Anemia in other chronic diseases classified elsewhere: Secondary | ICD-10-CM | POA: Diagnosis not present

## 2021-10-16 DIAGNOSIS — J189 Pneumonia, unspecified organism: Secondary | ICD-10-CM

## 2021-10-16 DIAGNOSIS — F339 Major depressive disorder, recurrent, unspecified: Secondary | ICD-10-CM

## 2021-10-16 DIAGNOSIS — G8929 Other chronic pain: Secondary | ICD-10-CM

## 2021-10-16 LAB — BRAIN NATRIURETIC PEPTIDE: B Natriuretic Peptide: 289

## 2021-10-16 NOTE — Progress Notes (Signed)
?Location:  [facility] Room Number: N35/A ?Place of Service:  SNF (31) ?Provider: Yvonna Alanis, NP ? ?Patient Care Team: ?Virgie Dad, MD as PCP - General (Internal Medicine) ?Mast, Man X, NP as Nurse Practitioner (Internal Medicine) ? ?Extended Emergency Contact Information ?Primary Emergency Contact: Henneman,[name] ?Address: [address] ?         [place], [place] [address] [place] ?Home Phone: [phone] ?Mobile Phone: [phone] ?Relation: Son ?Secondary Emergency Contact: Kuang, Dino ? [place] ?Home Phone: [phone] ?Mobile Phone: [phone] ?Relation: Other ? ?Code Status:  DNR ?Goals of care: Advanced Directive information ? ?  10/16/2021  ? 10:23 AM  ?Advanced Directives  ?Does Patient Have a Medical Advance Directive? Yes  ?Type of Paramedic of [place];Out of facility DNR (pink MOST or yellow form)  ?Does patient want to make changes to medical advance directive? No - Patient declined  ?Copy of [place] in Chart? Yes - validated most recent copy scanned in chart (See row information)  ?Pre-existing out of facility DNR order (yellow form or pink MOST form) Yellow form placed in chart (order not valid for inpatient use)  ? ? ? ?Chief Complaint  ?Patient presents with  ? Medical Management of Chronic Issues  ?  Routine visit.  ? Quality Metric Gaps  ?  Discuss the need for Pneumonia vaccine and additional Covid booster, or post pone if patient refuses.   ? ? ?HPI:  ?Pt is a 88 y.o. female seen today for medical management of chronic diseases.   ? ?She currently resides on the skilled nursing unit at [facility]. Past medical history includes: HTN, PVD, constipation, GERD, hypothyroidism, OA, osteoporosis, CKD3, cystitis, pancytopenia, depression/anxiety, weakness ?  ?Pneumonia- O2 sat 99% on 1 liter oxygen, 98% without O2 today, feeling much better, denies sob and cough, treated with levaquin and  doxycycline ?Diastolic dysfunction- BNP 289 10/08/2021, Echo 03/18 noted normal LV systolic function, mild LV hypertrophy noted, torsemide increased ?Weakness- related to recent pneumonia, OOB today, not working with PT at this time ?HTN- BUN/creat 16/0.9 10/08/2021, remains on losartan, NAS diet ?BLE- non pitting, wears teds, elevates legs in bed, remains on torsemide daily ?Impaired memory- BIMS score 15 06/30/2021, reports feeling more forgetful lately- she has cried to nursing a few times about it, appropriate today  ?Closed trimalleolar right ankle fracture- WBAT, brace discontinued, advanced to normal shoe with ankle brace ?Chronic back pain- increased with movement, remains on scheduled tylenol, gabapentin and oxycodone prn ?Anemia - Hgb 8.0 (was 9.1), Hct 26 (was 28.9), platelets 122 (was 131) 10/08/2021, remains on ferrous sulfate ?Hypothyroidism- TSH 3.82 10/20/2020, remains on levothyroxine ?GERD- hgb 8.0 10/08/2021, remains on Prilosec ?Depression with anxiety- no mood changes, remains on Wellbutrin, Zoloft and xanax prn ?Constipation- remains on senna daily and miralax prn ?  ?No recent falls or injuries. Ambulates with wheelchair.  ? ?Recent blood pressures: ? 03/21- 136/70 ? 03/14- 161/68 ? 03/13- 137/58 ? ?Recent weights: ? 03/01- 169.5 lbs ? 02/01- 168.8 lbs ? 01/02- 170.5 lbs ? ? ? ? ? ? ?Past Medical History:  ?Diagnosis Date  ? Anemia   ? Anxiety   ? Chronotropic incompetence 12/2012  ?  potentially medication related; noted on CPET   ? DDD (degenerative disc disease)   ? Eczema   ? GERD (gastroesophageal reflux disease)   ? H/O hiatal hernia   ? Hx: UTI (urinary tract infection)   ?  Hyperlipidemia   ? Hypertension   ? Hypothyroidism   ? Osteopenia   ? Septicemia ([place]) 2002  ? following UTI  ? Vertigo, benign positional   ? ?Past Surgical History:  ?Procedure Laterality Date  ? BREAST SURGERY    ? left biopsy  ? CATARACT EXTRACTION Right   ? 2 weeks ago  ? CPET / MET - PFTS    ? Consistent with  chronotropic incompetence; See attached report in the results section  ? DILATION AND CURETTAGE OF UTERUS    ? x 2  ? DOPPLER ECHOCARDIOGRAPHY  01/10/2013  ? Normal LV size and function. Normal EF. Air sclerosis but no stenosis  ? ESOPHAGOGASTRODUODENOSCOPY  05/04/2012  ? Procedure: ESOPHAGOGASTRODUODENOSCOPY (EGD);  Surgeon: Inda Castle, MD;  Location: [facility];  Service: Endoscopy;  Laterality: N/A;  ? EYE SURGERY    ? cataract extraction with ILO  ? eye  ? IR KYPHO EA ADDL LEVEL THORACIC OR LUMBAR  10/28/2016  ? IR KYPHO LUMBAR INC FX REDUCE BONE BX UNI/BIL CANNULATION INC/IMAGING  10/28/2016  ? IR KYPHO THORACIC WITH BONE BIOPSY  12/24/2016  ? IR RADIOLOGIST EVAL & MGMT  11/11/2016  ? KNEE ARTHROSCOPY  2011  ? right  ? ORIF ANKLE FRACTURE Right 03/27/2021  ? Procedure: OPEN REDUCTION INTERNAL FIXATION (ORIF) ANKLE FRACTURE;  Surgeon: Marchia Bond, MD;  Location: [facility];  Service: Orthopedics;  Laterality: Right;  ? TONSILLECTOMY    ? TOTAL KNEE ARTHROPLASTY  04/24/2012  ? Procedure: TOTAL KNEE ARTHROPLASTY;  Surgeon: Gearlean Alf, MD;  Location: [facility];  Service: Orthopedics;  Laterality: Right;  ? TRANSTHORACIC ECHOCARDIOGRAM  02/2018  ? Ordered by PCP for "congestive heart failure " -EF 60 M 65%.  GR 1 DD.  No R WMA--- > ESSENTIALLY NORMAL  ? ? ?No Known Allergies ? ?Outpatient Encounter Medications as of 10/16/2021  ?Medication Sig  ? acetaminophen (TYLENOL) 500 MG tablet Take 1,000 mg by mouth every 8 (eight) hours. For pain  ? Albuterol Sulfate 2.5 MG/0.5ML NEBU Inhale into the lungs every 6 (six) hours as needed (As needed for wheezing or shortness of breath).  ? ALPRAZolam (XANAX) 0.25 MG tablet Take 0.25 mg by mouth 2 (two) times daily as needed for anxiety. Don't give unless very anxious  ? atorvastatin (LIPITOR) 10 MG tablet Take 5 mg by mouth at bedtime.  ? buPROPion (WELLBUTRIN SR) 150 MG 12 hr tablet Take 1 tablet (150 mg total) by mouth 2 (two) times daily after a meal.  ? calcium citrate-vitamin  D (CITRACAL+D) 315-200 MG-UNIT tablet Take 1 tablet by mouth 2 (two) times daily.  ? Camphor-Menthol-Methyl Sal 3.07-31-08 % PTCH Place 1 patch onto the skin See admin instructions. Apply one patch onto the skin of the  lower back daily for back pain  ? Carboxymethylcellulose Sodium (ARTIFICIAL TEARS OP) Place 1 drop into both eyes in the morning, at noon, and at bedtime.  ? diclofenac Sodium (VOLTAREN) 1 % GEL Apply 4 g topically in the morning and at bedtime. Apply to bilateral ankles BID  ? ferrous sulfate 325 (65 FE) MG EC tablet Take 325 mg by mouth every Monday, Wednesday, and Friday. With a meal  ? fish oil-omega-3 fatty acids 1000 MG capsule Take 1 g by mouth every morning.  ? gabapentin (NEURONTIN) 100 MG capsule Take 100 mg by mouth 3 (three) times daily.  ? hydrocortisone cream 1 % Apply 1 application topically daily as needed for itching.  ? lactose free nutrition (  BOOST) LIQD Take 237 mLs by mouth 2 (two) times daily between meals.  ? levothyroxine (SYNTHROID) 75 MCG tablet Take 75 mcg by mouth every morning.  ? losartan (COZAAR) 25 MG tablet Take 25 mg by mouth every morning.  ? Menthol, Topical Analgesic, (BIOFREEZE) 4 % GEL Apply topically 2 (two) times daily as needed. Apply thin layer to left thigh  ? nystatin (MYCOSTATIN/NYSTOP) powder Apply 1 application topically 2 (two) times daily as needed (yeast).  ? omeprazole (PRILOSEC) 40 MG capsule Take 40 mg by mouth every morning.  ? OxyCODONE HCl, Abuse Deter, (OXAYDO) 5 MG TABA Take 2.5 mg by mouth 2 (two) times daily as needed. Give 30 minutes before transportation to ortho appointments.  ? polyethylene glycol (MIRALAX / GLYCOLAX) 17 g packet Take 17 g by mouth daily as needed (constipation).  ? potassium chloride SA (KLOR-CON M) 20 MEQ tablet Take 20 mEq by mouth daily.  ? senna (SENOKOT) 8.6 MG TABS tablet Take 2 tablets by mouth at bedtime.  ? sertraline (ZOLOFT) 25 MG tablet Take 75 mg by mouth every morning.  ? torsemide (DEMADEX) 20 MG tablet  Take 20 mg by mouth daily. Once A Day on Mon, Tue, Wed, Thu, Fri  ? zinc oxide 20 % ointment Apply 1 application topically See admin instructions. Apply topically to per/buttocks after every incontinent episode

## 2021-10-20 DIAGNOSIS — S82851D Displaced trimalleolar fracture of right lower leg, subsequent encounter for closed fracture with routine healing: Secondary | ICD-10-CM | POA: Diagnosis not present

## 2021-10-20 DIAGNOSIS — Z9181 History of falling: Secondary | ICD-10-CM | POA: Diagnosis not present

## 2021-10-20 DIAGNOSIS — M6281 Muscle weakness (generalized): Secondary | ICD-10-CM | POA: Diagnosis not present

## 2021-10-21 DIAGNOSIS — R2681 Unsteadiness on feet: Secondary | ICD-10-CM | POA: Diagnosis not present

## 2021-10-21 DIAGNOSIS — M6281 Muscle weakness (generalized): Secondary | ICD-10-CM | POA: Diagnosis not present

## 2021-10-21 DIAGNOSIS — Z9181 History of falling: Secondary | ICD-10-CM | POA: Diagnosis not present

## 2021-10-21 DIAGNOSIS — S82851D Displaced trimalleolar fracture of right lower leg, subsequent encounter for closed fracture with routine healing: Secondary | ICD-10-CM | POA: Diagnosis not present

## 2021-10-21 DIAGNOSIS — M545 Low back pain, unspecified: Secondary | ICD-10-CM | POA: Diagnosis not present

## 2021-10-22 DIAGNOSIS — Z961 Presence of intraocular lens: Secondary | ICD-10-CM | POA: Diagnosis not present

## 2021-10-23 DIAGNOSIS — M6281 Muscle weakness (generalized): Secondary | ICD-10-CM | POA: Diagnosis not present

## 2021-10-23 DIAGNOSIS — Z9181 History of falling: Secondary | ICD-10-CM | POA: Diagnosis not present

## 2021-10-23 DIAGNOSIS — S82851D Displaced trimalleolar fracture of right lower leg, subsequent encounter for closed fracture with routine healing: Secondary | ICD-10-CM | POA: Diagnosis not present

## 2021-10-27 DIAGNOSIS — Z9181 History of falling: Secondary | ICD-10-CM | POA: Diagnosis not present

## 2021-10-27 DIAGNOSIS — S82851D Displaced trimalleolar fracture of right lower leg, subsequent encounter for closed fracture with routine healing: Secondary | ICD-10-CM | POA: Diagnosis not present

## 2021-10-27 DIAGNOSIS — M6281 Muscle weakness (generalized): Secondary | ICD-10-CM | POA: Diagnosis not present

## 2021-10-28 DIAGNOSIS — M545 Low back pain, unspecified: Secondary | ICD-10-CM | POA: Diagnosis not present

## 2021-10-28 DIAGNOSIS — R2681 Unsteadiness on feet: Secondary | ICD-10-CM | POA: Diagnosis not present

## 2021-10-28 DIAGNOSIS — Z9181 History of falling: Secondary | ICD-10-CM | POA: Diagnosis not present

## 2021-10-28 DIAGNOSIS — M6281 Muscle weakness (generalized): Secondary | ICD-10-CM | POA: Diagnosis not present

## 2021-10-30 DIAGNOSIS — Z9181 History of falling: Secondary | ICD-10-CM | POA: Diagnosis not present

## 2021-10-30 DIAGNOSIS — S82851D Displaced trimalleolar fracture of right lower leg, subsequent encounter for closed fracture with routine healing: Secondary | ICD-10-CM | POA: Diagnosis not present

## 2021-10-30 DIAGNOSIS — M6281 Muscle weakness (generalized): Secondary | ICD-10-CM | POA: Diagnosis not present

## 2021-10-30 LAB — CBC AND DIFFERENTIAL
HCT: 28 — AB (ref 36–46)
Hemoglobin: 8.4 — AB (ref 12.0–16.0)
Neutrophils Absolute: 4256
Platelets: 104 10*3/uL — AB (ref 150–400)
WBC: 5.6

## 2021-10-30 LAB — LIPID PANEL
Cholesterol: 125 (ref 0–200)
HDL: 38 (ref 35–70)
LDL Cholesterol: 67
LDl/HDL Ratio: 3.3
Triglycerides: 123 (ref 40–160)

## 2021-10-30 LAB — CBC: RBC: 3.09 — AB (ref 3.87–5.11)

## 2021-11-04 DIAGNOSIS — S82851D Displaced trimalleolar fracture of right lower leg, subsequent encounter for closed fracture with routine healing: Secondary | ICD-10-CM | POA: Diagnosis not present

## 2021-11-04 DIAGNOSIS — M545 Low back pain, unspecified: Secondary | ICD-10-CM | POA: Diagnosis not present

## 2021-11-04 DIAGNOSIS — Z9181 History of falling: Secondary | ICD-10-CM | POA: Diagnosis not present

## 2021-11-04 DIAGNOSIS — M6281 Muscle weakness (generalized): Secondary | ICD-10-CM | POA: Diagnosis not present

## 2021-11-04 DIAGNOSIS — R2681 Unsteadiness on feet: Secondary | ICD-10-CM | POA: Diagnosis not present

## 2021-11-06 DIAGNOSIS — S82851D Displaced trimalleolar fracture of right lower leg, subsequent encounter for closed fracture with routine healing: Secondary | ICD-10-CM | POA: Diagnosis not present

## 2021-11-06 DIAGNOSIS — M6281 Muscle weakness (generalized): Secondary | ICD-10-CM | POA: Diagnosis not present

## 2021-11-06 DIAGNOSIS — Z9181 History of falling: Secondary | ICD-10-CM | POA: Diagnosis not present

## 2021-11-09 DIAGNOSIS — M545 Low back pain, unspecified: Secondary | ICD-10-CM | POA: Diagnosis not present

## 2021-11-09 DIAGNOSIS — M6281 Muscle weakness (generalized): Secondary | ICD-10-CM | POA: Diagnosis not present

## 2021-11-09 DIAGNOSIS — R2681 Unsteadiness on feet: Secondary | ICD-10-CM | POA: Diagnosis not present

## 2021-11-09 DIAGNOSIS — Z9181 History of falling: Secondary | ICD-10-CM | POA: Diagnosis not present

## 2021-11-11 DIAGNOSIS — S82851D Displaced trimalleolar fracture of right lower leg, subsequent encounter for closed fracture with routine healing: Secondary | ICD-10-CM | POA: Diagnosis not present

## 2021-11-11 DIAGNOSIS — Z9181 History of falling: Secondary | ICD-10-CM | POA: Diagnosis not present

## 2021-11-11 DIAGNOSIS — M545 Low back pain, unspecified: Secondary | ICD-10-CM | POA: Diagnosis not present

## 2021-11-11 DIAGNOSIS — R2681 Unsteadiness on feet: Secondary | ICD-10-CM | POA: Diagnosis not present

## 2021-11-11 DIAGNOSIS — M6281 Muscle weakness (generalized): Secondary | ICD-10-CM | POA: Diagnosis not present

## 2021-11-12 ENCOUNTER — Non-Acute Institutional Stay (SKILLED_NURSING_FACILITY): Payer: Medicare Other | Admitting: Internal Medicine

## 2021-11-12 ENCOUNTER — Encounter: Payer: Self-pay | Admitting: Internal Medicine

## 2021-11-12 DIAGNOSIS — D61818 Other pancytopenia: Secondary | ICD-10-CM

## 2021-11-12 DIAGNOSIS — K219 Gastro-esophageal reflux disease without esophagitis: Secondary | ICD-10-CM

## 2021-11-12 DIAGNOSIS — G8929 Other chronic pain: Secondary | ICD-10-CM | POA: Diagnosis not present

## 2021-11-12 DIAGNOSIS — I5189 Other ill-defined heart diseases: Secondary | ICD-10-CM

## 2021-11-12 DIAGNOSIS — F418 Other specified anxiety disorders: Secondary | ICD-10-CM

## 2021-11-12 DIAGNOSIS — M545 Low back pain, unspecified: Secondary | ICD-10-CM | POA: Diagnosis not present

## 2021-11-12 DIAGNOSIS — I10 Essential (primary) hypertension: Secondary | ICD-10-CM | POA: Diagnosis not present

## 2021-11-12 DIAGNOSIS — N1831 Chronic kidney disease, stage 3a: Secondary | ICD-10-CM

## 2021-11-12 DIAGNOSIS — J189 Pneumonia, unspecified organism: Secondary | ICD-10-CM

## 2021-11-12 DIAGNOSIS — E039 Hypothyroidism, unspecified: Secondary | ICD-10-CM

## 2021-11-12 NOTE — Progress Notes (Signed)
?Location:   [facility] Room Number: 35 ?Place of Service:  SNF (31) ?Provider:  Veleta Miners MD ? ?Virgie Dad, MD ? ?Patient Care Team: ?Virgie Dad, MD as PCP - General (Internal Medicine) ?Mast, Man X, NP as Nurse Practitioner (Internal Medicine) ? ?Extended Emergency Contact Information ?Primary Emergency Contact: Tuazon,[name] ?Address: [address] ?         [place], [place] [address] [place] ?Home Phone: [phone] ?Mobile Phone: [phone] ?Relation: Son ?Secondary Emergency Contact: Heritage, Dino ? [place] ?Home Phone: [phone] ?Mobile Phone: [phone] ?Relation: Other ? ?Code Status:  DNR Managed Care ?Goals of care: Advanced Directive information ? ?  11/12/2021  ? 11:51 AM  ?Advanced Directives  ?Does Patient Have a Medical Advance Directive? Yes  ?Type of Paramedic of [place];Out of facility DNR (pink MOST or yellow form)  ?Does patient want to make changes to medical advance directive? No - Patient declined  ?Copy of [place] in Chart? Yes - validated most recent copy scanned in chart (See row information)  ?Pre-existing out of facility DNR order (yellow form or pink MOST form) Yellow form placed in chart (order not valid for inpatient use)  ? ? ? ?Chief Complaint  ?Patient presents with  ? Medical Management of Chronic Issues  ? Quality Metric Gaps  ?  Verified [facility] and [facility] patient is due for PCV. ?  ? ? ?HPI:  ?Pt is a 88 y.o. female seen today for medical management of chronic diseases.   ? ?Patient has h/o Pancytopenia , h/o Chronic Back Pain, Hypertension, Hypothyroidism, Hypertension, Recurrent Cystitis, Osteoporosis and Major Depression  ?Admitted in the hospital From 9/1-9/7 for Right Trimalleolar Fracture. S/P ORIF ? ?Was Diagnosed with Pneumonia Twice in past month and needed 2 rounds of Antibiotics ?Doing better now ?Still on Low Oxygen but denies SOB ?Needs helps with her ADLS  and transfers ?Is still not walking much with there walker ?Stays mostly in Wheelchair ?Appetite is good. Has lost some weight ?Cognition is good. Mental status at baseline ?Wt Readings from Last 3 Encounters:  ?11/12/21 161 lb 14.4 oz (73.4 kg)  ?10/16/21 169 lb 8 oz (76.9 kg)  ?10/15/21 169 lb 8 oz (76.9 kg)  ?  ?Past Medical History:  ?Diagnosis Date  ? Anemia   ? Anxiety   ? Chronotropic incompetence 12/2012  ?  potentially medication related; noted on CPET   ? DDD (degenerative disc disease)   ? Eczema   ? GERD (gastroesophageal reflux disease)   ? H/O hiatal hernia   ? Hx: UTI (urinary tract infection)   ? Hyperlipidemia   ? Hypertension   ? Hypothyroidism   ? Osteopenia   ? Septicemia ([place]) 2002  ? following UTI  ? Vertigo, benign positional   ? ?Past Surgical History:  ?Procedure Laterality Date  ? BREAST SURGERY    ? left biopsy  ? CATARACT EXTRACTION Right   ? 2 weeks ago  ? CPET / MET - PFTS    ? Consistent with chronotropic incompetence; See attached report in the results section  ? DILATION AND CURETTAGE OF UTERUS    ? x 2  ? DOPPLER ECHOCARDIOGRAPHY  01/10/2013  ? Normal LV size and function. Normal EF. Air sclerosis but no stenosis  ? ESOPHAGOGASTRODUODENOSCOPY  05/04/2012  ? Procedure: ESOPHAGOGASTRODUODENOSCOPY (EGD);  Surgeon: Inda Castle, MD;  Location: [facility];  Service: Endoscopy;  Laterality: N/A;  ?  EYE SURGERY    ? cataract extraction with ILO  ? eye  ? IR KYPHO EA ADDL LEVEL THORACIC OR LUMBAR  10/28/2016  ? IR KYPHO LUMBAR INC FX REDUCE BONE BX UNI/BIL CANNULATION INC/IMAGING  10/28/2016  ? IR KYPHO THORACIC WITH BONE BIOPSY  12/24/2016  ? IR RADIOLOGIST EVAL & MGMT  11/11/2016  ? KNEE ARTHROSCOPY  2011  ? right  ? ORIF ANKLE FRACTURE Right 03/27/2021  ? Procedure: OPEN REDUCTION INTERNAL FIXATION (ORIF) ANKLE FRACTURE;  Surgeon: Marchia Bond, MD;  Location: [facility];  Service: Orthopedics;  Laterality: Right;  ? TONSILLECTOMY    ? TOTAL KNEE ARTHROPLASTY  04/24/2012  ? Procedure: TOTAL KNEE  ARTHROPLASTY;  Surgeon: Gearlean Alf, MD;  Location: [facility];  Service: Orthopedics;  Laterality: Right;  ? TRANSTHORACIC ECHOCARDIOGRAM  02/2018  ? Ordered by PCP for "congestive heart failure " -EF 60 M 65%.  GR 1 DD.  No R WMA--- > ESSENTIALLY NORMAL  ? ? ?No Known Allergies ? ?Allergies as of 11/12/2021   ?No Known Allergies ?  ? ?  ?Medication List  ?  ? ?  ? Accurate as of November 12, 2021 11:52 AM. If you have any questions, ask your nurse or doctor.  ?  ?  ? ?  ? ?acetaminophen 500 MG tablet ?Commonly known as: TYLENOL ?Take 1,000 mg by mouth every 8 (eight) hours. For pain ?  ?Albuterol Sulfate 2.5 MG/0.5ML Nebu ?Inhale into the lungs every 6 (six) hours as needed (As needed for wheezing or shortness of breath). ?  ?ALPRAZolam 0.25 MG tablet ?Commonly known as: Duanne Moron ?Take 0.25 mg by mouth 2 (two) times daily as needed for anxiety. Don't give unless very anxious ?  ?ARTIFICIAL TEARS OP ?Place 1 drop into both eyes in the morning, at noon, and at bedtime. ?  ?atorvastatin 10 MG tablet ?Commonly known as: LIPITOR ?Take 5 mg by mouth at bedtime. ?  ?Biofreeze 4 % Gel ?Generic drug: Menthol (Topical Analgesic) ?Apply topically 2 (two) times daily as needed. Apply thin layer to left thigh ?  ?buPROPion 150 MG 12 hr tablet ?Commonly known as: WELLBUTRIN SR ?Take 1 tablet (150 mg total) by mouth 2 (two) times daily after a meal. ?  ?calcium citrate-vitamin D 315-200 MG-UNIT tablet ?Commonly known as: CITRACAL+D ?Take 1 tablet by mouth 2 (two) times daily. ?  ?Camphor-Menthol-Methyl Sal 3.07-31-08 % Ptch ?Place 1 patch onto the skin See admin instructions. Apply one patch onto the skin of the  lower back daily for back pain ?  ?diclofenac Sodium 1 % Gel ?Commonly known as: VOLTAREN ?Apply 4 g topically in the morning and at bedtime. Apply to bilateral ankles BID ?  ?ferrous sulfate 325 (65 FE) MG EC tablet ?Take 325 mg by mouth every Monday, Wednesday, and Friday. With a meal ?  ?fish oil-omega-3 fatty acids 1000 MG  capsule ?Take 1 g by mouth every morning. ?  ?gabapentin 100 MG capsule ?Commonly known as: NEURONTIN ?Take 100 mg by mouth 3 (three) times daily. ?  ?hydrocortisone cream 1 % ?Apply 1 application topically daily as needed for itching. ?  ?lactose free nutrition Liqd ?Take 237 mLs by mouth 2 (two) times daily between meals. ?  ?levothyroxine 75 MCG tablet ?Commonly known as: SYNTHROID ?Take 75 mcg by mouth every morning. ?  ?losartan 25 MG tablet ?Commonly known as: COZAAR ?Take 25 mg by mouth every morning. ?  ?nystatin powder ?Commonly known as: MYCOSTATIN/NYSTOP ?Apply 1 application topically 2 (  two) times daily as needed (yeast). ?  ?omeprazole 40 MG capsule ?Commonly known as: PRILOSEC ?Take 40 mg by mouth every morning. ?  ?OxyCODONE HCl (Abuse Deter) 5 MG Taba ?Commonly known as: OXAYDO ?Take 2.5 mg by mouth 2 (two) times daily as needed. Give 30 minutes before transportation to ortho appointments. ?  ?polyethylene glycol 17 g packet ?Commonly known as: MIRALAX / GLYCOLAX ?Take 17 g by mouth daily as needed (constipation). ?  ?potassium chloride SA 20 MEQ tablet ?Commonly known as: KLOR-CON M ?Take 20 mEq by mouth daily. ?  ?senna 8.6 MG Tabs tablet ?Commonly known as: SENOKOT ?Take 2 tablets by mouth at bedtime. ?  ?sertraline 25 MG tablet ?Commonly known as: ZOLOFT ?Take 75 mg by mouth every morning. ?  ?torsemide 20 MG tablet ?Commonly known as: DEMADEX ?Take 20 mg by mouth daily. Once A Day on Mon, Tue, Wed, Thu, Fri ?  ?zinc oxide 20 % ointment ?Apply 1 application topically See admin instructions. Apply topically to per/buttocks after every incontinent episode and as needed for redness ?  ? ?  ? ? ?Review of Systems  ?Constitutional:  Negative for activity change and appetite change.  ?HENT: Negative.    ?Respiratory:  Negative for cough and shortness of breath.   ?Cardiovascular:  Negative for leg swelling.  ?Gastrointestinal:  Negative for constipation.  ?Genitourinary: Negative.   ?Musculoskeletal:   Positive for back pain and gait problem. Negative for arthralgias and myalgias.  ?Skin: Negative.   ?Neurological:  Positive for weakness. Negative for dizziness.  ?Psychiatric/Behavioral:  Negative for

## 2021-11-13 DIAGNOSIS — M6281 Muscle weakness (generalized): Secondary | ICD-10-CM | POA: Diagnosis not present

## 2021-11-13 DIAGNOSIS — Z9181 History of falling: Secondary | ICD-10-CM | POA: Diagnosis not present

## 2021-11-13 DIAGNOSIS — S82851D Displaced trimalleolar fracture of right lower leg, subsequent encounter for closed fracture with routine healing: Secondary | ICD-10-CM | POA: Diagnosis not present

## 2021-11-17 DIAGNOSIS — M6281 Muscle weakness (generalized): Secondary | ICD-10-CM | POA: Diagnosis not present

## 2021-11-17 DIAGNOSIS — R2681 Unsteadiness on feet: Secondary | ICD-10-CM | POA: Diagnosis not present

## 2021-11-17 DIAGNOSIS — M545 Low back pain, unspecified: Secondary | ICD-10-CM | POA: Diagnosis not present

## 2021-11-17 DIAGNOSIS — Z9181 History of falling: Secondary | ICD-10-CM | POA: Diagnosis not present

## 2021-11-18 DIAGNOSIS — M545 Low back pain, unspecified: Secondary | ICD-10-CM | POA: Diagnosis not present

## 2021-11-18 DIAGNOSIS — S82851D Displaced trimalleolar fracture of right lower leg, subsequent encounter for closed fracture with routine healing: Secondary | ICD-10-CM | POA: Diagnosis not present

## 2021-11-18 DIAGNOSIS — Z9181 History of falling: Secondary | ICD-10-CM | POA: Diagnosis not present

## 2021-11-18 DIAGNOSIS — M6281 Muscle weakness (generalized): Secondary | ICD-10-CM | POA: Diagnosis not present

## 2021-11-18 DIAGNOSIS — R2681 Unsteadiness on feet: Secondary | ICD-10-CM | POA: Diagnosis not present

## 2021-11-20 DIAGNOSIS — S82851D Displaced trimalleolar fracture of right lower leg, subsequent encounter for closed fracture with routine healing: Secondary | ICD-10-CM | POA: Diagnosis not present

## 2021-11-20 DIAGNOSIS — M6281 Muscle weakness (generalized): Secondary | ICD-10-CM | POA: Diagnosis not present

## 2021-11-20 DIAGNOSIS — Z9181 History of falling: Secondary | ICD-10-CM | POA: Diagnosis not present

## 2021-11-24 DIAGNOSIS — M6281 Muscle weakness (generalized): Secondary | ICD-10-CM | POA: Diagnosis not present

## 2021-11-24 DIAGNOSIS — R2681 Unsteadiness on feet: Secondary | ICD-10-CM | POA: Diagnosis not present

## 2021-11-24 DIAGNOSIS — M545 Low back pain, unspecified: Secondary | ICD-10-CM | POA: Diagnosis not present

## 2021-11-24 DIAGNOSIS — Z9181 History of falling: Secondary | ICD-10-CM | POA: Diagnosis not present

## 2021-11-25 DIAGNOSIS — Z9181 History of falling: Secondary | ICD-10-CM | POA: Diagnosis not present

## 2021-11-25 DIAGNOSIS — M6281 Muscle weakness (generalized): Secondary | ICD-10-CM | POA: Diagnosis not present

## 2021-11-25 DIAGNOSIS — S82851D Displaced trimalleolar fracture of right lower leg, subsequent encounter for closed fracture with routine healing: Secondary | ICD-10-CM | POA: Diagnosis not present

## 2021-11-26 DIAGNOSIS — M6281 Muscle weakness (generalized): Secondary | ICD-10-CM | POA: Diagnosis not present

## 2021-11-26 DIAGNOSIS — R2681 Unsteadiness on feet: Secondary | ICD-10-CM | POA: Diagnosis not present

## 2021-11-26 DIAGNOSIS — Z9181 History of falling: Secondary | ICD-10-CM | POA: Diagnosis not present

## 2021-11-26 DIAGNOSIS — M545 Low back pain, unspecified: Secondary | ICD-10-CM | POA: Diagnosis not present

## 2021-11-27 DIAGNOSIS — M6281 Muscle weakness (generalized): Secondary | ICD-10-CM | POA: Diagnosis not present

## 2021-11-27 DIAGNOSIS — S82851D Displaced trimalleolar fracture of right lower leg, subsequent encounter for closed fracture with routine healing: Secondary | ICD-10-CM | POA: Diagnosis not present

## 2021-11-27 DIAGNOSIS — Z9181 History of falling: Secondary | ICD-10-CM | POA: Diagnosis not present

## 2021-12-01 DIAGNOSIS — M6281 Muscle weakness (generalized): Secondary | ICD-10-CM | POA: Diagnosis not present

## 2021-12-01 DIAGNOSIS — M545 Low back pain, unspecified: Secondary | ICD-10-CM | POA: Diagnosis not present

## 2021-12-01 DIAGNOSIS — R2681 Unsteadiness on feet: Secondary | ICD-10-CM | POA: Diagnosis not present

## 2021-12-01 DIAGNOSIS — Z9181 History of falling: Secondary | ICD-10-CM | POA: Diagnosis not present

## 2021-12-02 ENCOUNTER — Encounter: Payer: Self-pay | Admitting: Orthopedic Surgery

## 2021-12-02 ENCOUNTER — Non-Acute Institutional Stay (SKILLED_NURSING_FACILITY): Payer: Medicare Other | Admitting: Orthopedic Surgery

## 2021-12-02 DIAGNOSIS — E039 Hypothyroidism, unspecified: Secondary | ICD-10-CM

## 2021-12-02 DIAGNOSIS — S82851D Displaced trimalleolar fracture of right lower leg, subsequent encounter for closed fracture with routine healing: Secondary | ICD-10-CM | POA: Diagnosis not present

## 2021-12-02 DIAGNOSIS — I5189 Other ill-defined heart diseases: Secondary | ICD-10-CM

## 2021-12-02 DIAGNOSIS — R413 Other amnesia: Secondary | ICD-10-CM

## 2021-12-02 DIAGNOSIS — E782 Mixed hyperlipidemia: Secondary | ICD-10-CM

## 2021-12-02 DIAGNOSIS — G8929 Other chronic pain: Secondary | ICD-10-CM

## 2021-12-02 DIAGNOSIS — K219 Gastro-esophageal reflux disease without esophagitis: Secondary | ICD-10-CM | POA: Diagnosis not present

## 2021-12-02 DIAGNOSIS — R634 Abnormal weight loss: Secondary | ICD-10-CM | POA: Diagnosis not present

## 2021-12-02 DIAGNOSIS — Z9181 History of falling: Secondary | ICD-10-CM | POA: Diagnosis not present

## 2021-12-02 DIAGNOSIS — I10 Essential (primary) hypertension: Secondary | ICD-10-CM

## 2021-12-02 DIAGNOSIS — D61818 Other pancytopenia: Secondary | ICD-10-CM | POA: Diagnosis not present

## 2021-12-02 DIAGNOSIS — M545 Low back pain, unspecified: Secondary | ICD-10-CM

## 2021-12-02 DIAGNOSIS — D638 Anemia in other chronic diseases classified elsewhere: Secondary | ICD-10-CM

## 2021-12-02 DIAGNOSIS — M6281 Muscle weakness (generalized): Secondary | ICD-10-CM | POA: Diagnosis not present

## 2021-12-02 DIAGNOSIS — R600 Localized edema: Secondary | ICD-10-CM

## 2021-12-02 DIAGNOSIS — F418 Other specified anxiety disorders: Secondary | ICD-10-CM

## 2021-12-02 NOTE — Progress Notes (Signed)
?Location:  [facility] Room Number: N35/A ?Place of Service:  SNF (31) ?Provider: Yvonna Alanis, NP ? ?Patient Care Team: ?Virgie Dad, MD as PCP - General (Internal Medicine) ?Mast, Man X, NP as Nurse Practitioner (Internal Medicine) ? ?Extended Emergency Contact Information ?Primary Emergency Contact: Decker,[name] ?Address: [address] ?         [place], [place] [address] [place] ?Home Phone: [phone] ?Mobile Phone: [phone] ?Relation: Son ?Secondary Emergency Contact: Maione, Dino ? [place] ?Home Phone: [phone] ?Mobile Phone: [phone] ?Relation: Other ? ?Code Status:  DNR ?Goals of care: Advanced Directive information ? ?  12/02/2021  ?  9:57 AM  ?Advanced Directives  ?Does Patient Have a Medical Advance Directive? Yes  ?Type of Paramedic of [place];Out of facility DNR (pink MOST or yellow form)  ?Does patient want to make changes to medical advance directive? No - Patient declined  ?Copy of [place] in Chart? Yes - validated most recent copy scanned in chart (See row information)  ?Pre-existing out of facility DNR order (yellow form or pink MOST form) Yellow form placed in chart (order not valid for inpatient use)  ? ? ? ?Chief Complaint  ?Patient presents with  ? Medical Management of Chronic Issues  ?  Routine visit.  ? Quality Metric Gaps  ?  Discuss the need for Pneumonia vaccine, or post pone if patient refuses.   ? ? ?HPI:  ?Pt is a 88 y.o. female seen today for medical management of chronic diseases.   ? ?She currently resides on the skilled nursing unit at [facility]. Past medical history includes: HTN, PVD, constipation, GERD, hypothyroidism, OA, osteoporosis, CKD3, cystitis, pancytopenia, depression/anxiety, weakness ? ?HTN- BUN/creat 16/0.9 10/08/2021, remains on losartan, NAS diet ?HLD- LDL 67 10/30/2021, remains on Lipitor ?Diastolic dysfunction- BNP 289 10/08/2021, Echo 03/18 noted  normal LV systolic function, mild LV hypertrophy noted, remains in torsemide ?BLE- non pitting, wears teds, elevates legs in bed, see above ?Weight loss- see trends below, appetite has improved ?Impaired memory- BIMS score 15 06/30/2021, reports feeling more forgetful lately- she has cried to nursing a few times about it, appropriate today  ?Chronic back pain- increased with movement, remains on scheduled tylenol, gabapentin and oxycodone prn ?Anemia/pancytopenia - Hgb 8.4 (was 8.0), Hct 26 (was 28.9), platelets 104 (was 122) 10/30/2021, remains on ferrous sulfate ?Hypothyroidism- TSH 3.49 09/17/2021, remains on levothyroxine ?GERD- hgb 8.4 10/30/2021, remains on Prilosec ?Depression with anxiety- no mood changes, remains on Wellbutrin, Zoloft and xanax prn ? ?No recent falls or injuries. Ambulates with wheelchair.  ? ?Pneumonia 09/2021. Resolved. Denies difficulty breathing.  ? ?Recent blood pressures: ? 05/09- 157/70 ? 05/02- 144/60 ? 04/25- 141/70 ? ?Recent weights: ? 05/01- 160.8 lbs ? 04/06- 161.9 lbs ? 03/01- 169.5 lbs ? ?Past Medical History:  ?Diagnosis Date  ? Anemia   ? Anxiety   ? Chronotropic incompetence 12/2012  ?  potentially medication related; noted on CPET   ? DDD (degenerative disc disease)   ? Eczema   ? GERD (gastroesophageal reflux disease)   ? H/O hiatal hernia   ? Hx: UTI (urinary tract infection)   ? Hyperlipidemia   ? Hypertension   ? Hypothyroidism   ? Osteopenia   ? Septicemia ([place]) 2002  ? following UTI  ? Vertigo, benign positional   ? ?Past Surgical History:  ?Procedure Laterality Date  ? BREAST SURGERY    ? left biopsy  ?  CATARACT EXTRACTION Right   ? 2 weeks ago  ? CPET / MET - PFTS    ? Consistent with chronotropic incompetence; See attached report in the results section  ? DILATION AND CURETTAGE OF UTERUS    ? x 2  ? DOPPLER ECHOCARDIOGRAPHY  01/10/2013  ? Normal LV size and function. Normal EF. Air sclerosis but no stenosis  ? ESOPHAGOGASTRODUODENOSCOPY  05/04/2012  ? Procedure:  ESOPHAGOGASTRODUODENOSCOPY (EGD);  Surgeon: Inda Castle, MD;  Location: [facility];  Service: Endoscopy;  Laterality: N/A;  ? EYE SURGERY    ? cataract extraction with ILO  ? eye  ? IR KYPHO EA ADDL LEVEL THORACIC OR LUMBAR  10/28/2016  ? IR KYPHO LUMBAR INC FX REDUCE BONE BX UNI/BIL CANNULATION INC/IMAGING  10/28/2016  ? IR KYPHO THORACIC WITH BONE BIOPSY  12/24/2016  ? IR RADIOLOGIST EVAL & MGMT  11/11/2016  ? KNEE ARTHROSCOPY  2011  ? right  ? ORIF ANKLE FRACTURE Right 03/27/2021  ? Procedure: OPEN REDUCTION INTERNAL FIXATION (ORIF) ANKLE FRACTURE;  Surgeon: Marchia Bond, MD;  Location: [facility];  Service: Orthopedics;  Laterality: Right;  ? TONSILLECTOMY    ? TOTAL KNEE ARTHROPLASTY  04/24/2012  ? Procedure: TOTAL KNEE ARTHROPLASTY;  Surgeon: Gearlean Alf, MD;  Location: [facility];  Service: Orthopedics;  Laterality: Right;  ? TRANSTHORACIC ECHOCARDIOGRAM  02/2018  ? Ordered by PCP for "congestive heart failure " -EF 60 M 65%.  GR 1 DD.  No R WMA--- > ESSENTIALLY NORMAL  ? ? ?No Known Allergies ? ?Outpatient Encounter Medications as of 12/02/2021  ?Medication Sig  ? acetaminophen (TYLENOL) 500 MG tablet Take 1,000 mg by mouth every 8 (eight) hours. For pain  ? Albuterol Sulfate 2.5 MG/0.5ML NEBU Inhale into the lungs every 6 (six) hours as needed (As needed for wheezing or shortness of breath).  ? ALPRAZolam (XANAX) 0.25 MG tablet Take 0.25 mg by mouth 2 (two) times daily as needed for anxiety. Don't give unless very anxious  ? atorvastatin (LIPITOR) 10 MG tablet Take 5 mg by mouth at bedtime.  ? buPROPion (WELLBUTRIN SR) 150 MG 12 hr tablet Take 1 tablet (150 mg total) by mouth 2 (two) times daily after a meal.  ? calcium citrate-vitamin D (CITRACAL+D) 315-200 MG-UNIT tablet Take 1 tablet by mouth 2 (two) times daily.  ? Camphor-Menthol-Methyl Sal 3.07-31-08 % PTCH Place 1 patch onto the skin See admin instructions. Apply one patch onto the skin of the  lower back daily for back pain  ? Carboxymethylcellulose Sodium  (ARTIFICIAL TEARS OP) Place 1 drop into both eyes in the morning, at noon, and at bedtime.  ? diclofenac Sodium (VOLTAREN) 1 % GEL Apply 4 g topically in the morning and at bedtime. Apply to bilateral ankles BID  ? ferrous sulfate 325 (65 FE) MG EC tablet Take 325 mg by mouth every Monday, Wednesday, and Friday. With a meal  ? fish oil-omega-3 fatty acids 1000 MG capsule Take 1 g by mouth every morning.  ? gabapentin (NEURONTIN) 100 MG capsule Take 100 mg by mouth 3 (three) times daily.  ? hydrocortisone cream 1 % Apply 1 application topically daily as needed for itching.  ? levothyroxine (SYNTHROID) 75 MCG tablet Take 75 mcg by mouth every morning.  ? losartan (COZAAR) 25 MG tablet Take 25 mg by mouth every morning.  ? Menthol, Topical Analgesic, (BIOFREEZE) 4 % GEL Apply topically 2 (two) times daily as needed. Apply thin layer to left thigh  ? nystatin (MYCOSTATIN/NYSTOP)  powder Apply 1 application topically 2 (two) times daily as needed (yeast).  ? omeprazole (PRILOSEC) 40 MG capsule Take 40 mg by mouth every morning.  ? OxyCODONE HCl, Abuse Deter, (OXAYDO) 5 MG TABA Take 2.5 mg by mouth 2 (two) times daily as needed. Give 30 minutes before transportation to ortho appointments.  ? polyethylene glycol (MIRALAX / GLYCOLAX) 17 g packet Take 17 g by mouth daily as needed (constipation).  ? potassium chloride SA (KLOR-CON M) 20 MEQ tablet Take 20 mEq by mouth daily.  ? senna (SENOKOT) 8.6 MG TABS tablet Take 2 tablets by mouth at bedtime.  ? sertraline (ZOLOFT) 25 MG tablet Take 75 mg by mouth every morning.  ? torsemide (DEMADEX) 20 MG tablet Take 20 mg by mouth daily. Once A Day on Mon, Tue, Wed, Thu, Fri  ? zinc oxide 20 % ointment Apply 1 application topically See admin instructions. Apply topically to per/buttocks after every incontinent episode and as needed for redness  ? [DISCONTINUED] lactose free nutrition (BOOST) LIQD Take 237 mLs by mouth 2 (two) times daily between meals.  ? ?No facility-administered  encounter medications on file as of 12/02/2021.  ? ? ?Review of Systems ? ?Immunization History  ?Administered Date(s) Administered  ? Influenza, High Dose Seasonal PF 04/26/2016, 03/22/2017, 05/08/2019  ? I

## 2021-12-03 DIAGNOSIS — R2681 Unsteadiness on feet: Secondary | ICD-10-CM | POA: Diagnosis not present

## 2021-12-03 DIAGNOSIS — M6281 Muscle weakness (generalized): Secondary | ICD-10-CM | POA: Diagnosis not present

## 2021-12-03 DIAGNOSIS — Z9181 History of falling: Secondary | ICD-10-CM | POA: Diagnosis not present

## 2021-12-03 DIAGNOSIS — M545 Low back pain, unspecified: Secondary | ICD-10-CM | POA: Diagnosis not present

## 2021-12-08 DIAGNOSIS — M545 Low back pain, unspecified: Secondary | ICD-10-CM | POA: Diagnosis not present

## 2021-12-08 DIAGNOSIS — M6281 Muscle weakness (generalized): Secondary | ICD-10-CM | POA: Diagnosis not present

## 2021-12-08 DIAGNOSIS — Z9181 History of falling: Secondary | ICD-10-CM | POA: Diagnosis not present

## 2021-12-08 DIAGNOSIS — R2681 Unsteadiness on feet: Secondary | ICD-10-CM | POA: Diagnosis not present

## 2021-12-10 DIAGNOSIS — M6281 Muscle weakness (generalized): Secondary | ICD-10-CM | POA: Diagnosis not present

## 2021-12-10 DIAGNOSIS — M545 Low back pain, unspecified: Secondary | ICD-10-CM | POA: Diagnosis not present

## 2021-12-10 DIAGNOSIS — Z9181 History of falling: Secondary | ICD-10-CM | POA: Diagnosis not present

## 2021-12-10 DIAGNOSIS — R2681 Unsteadiness on feet: Secondary | ICD-10-CM | POA: Diagnosis not present

## 2021-12-15 DIAGNOSIS — Z9181 History of falling: Secondary | ICD-10-CM | POA: Diagnosis not present

## 2021-12-15 DIAGNOSIS — R2681 Unsteadiness on feet: Secondary | ICD-10-CM | POA: Diagnosis not present

## 2021-12-15 DIAGNOSIS — M6281 Muscle weakness (generalized): Secondary | ICD-10-CM | POA: Diagnosis not present

## 2021-12-15 DIAGNOSIS — M545 Low back pain, unspecified: Secondary | ICD-10-CM | POA: Diagnosis not present

## 2021-12-17 DIAGNOSIS — M6281 Muscle weakness (generalized): Secondary | ICD-10-CM | POA: Diagnosis not present

## 2021-12-17 DIAGNOSIS — M545 Low back pain, unspecified: Secondary | ICD-10-CM | POA: Diagnosis not present

## 2021-12-17 DIAGNOSIS — Z9181 History of falling: Secondary | ICD-10-CM | POA: Diagnosis not present

## 2021-12-17 DIAGNOSIS — R2681 Unsteadiness on feet: Secondary | ICD-10-CM | POA: Diagnosis not present

## 2021-12-22 DIAGNOSIS — M545 Low back pain, unspecified: Secondary | ICD-10-CM | POA: Diagnosis not present

## 2021-12-22 DIAGNOSIS — Z9181 History of falling: Secondary | ICD-10-CM | POA: Diagnosis not present

## 2021-12-22 DIAGNOSIS — R2681 Unsteadiness on feet: Secondary | ICD-10-CM | POA: Diagnosis not present

## 2021-12-22 DIAGNOSIS — M6281 Muscle weakness (generalized): Secondary | ICD-10-CM | POA: Diagnosis not present

## 2021-12-24 DIAGNOSIS — M6281 Muscle weakness (generalized): Secondary | ICD-10-CM | POA: Diagnosis not present

## 2021-12-24 DIAGNOSIS — R2681 Unsteadiness on feet: Secondary | ICD-10-CM | POA: Diagnosis not present

## 2021-12-24 DIAGNOSIS — M545 Low back pain, unspecified: Secondary | ICD-10-CM | POA: Diagnosis not present

## 2021-12-24 DIAGNOSIS — Z9181 History of falling: Secondary | ICD-10-CM | POA: Diagnosis not present

## 2021-12-29 DIAGNOSIS — R2681 Unsteadiness on feet: Secondary | ICD-10-CM | POA: Diagnosis not present

## 2021-12-29 DIAGNOSIS — Z9181 History of falling: Secondary | ICD-10-CM | POA: Diagnosis not present

## 2021-12-29 DIAGNOSIS — M545 Low back pain, unspecified: Secondary | ICD-10-CM | POA: Diagnosis not present

## 2021-12-29 DIAGNOSIS — M6281 Muscle weakness (generalized): Secondary | ICD-10-CM | POA: Diagnosis not present

## 2021-12-31 DIAGNOSIS — M545 Low back pain, unspecified: Secondary | ICD-10-CM | POA: Diagnosis not present

## 2021-12-31 DIAGNOSIS — M6281 Muscle weakness (generalized): Secondary | ICD-10-CM | POA: Diagnosis not present

## 2021-12-31 DIAGNOSIS — R2681 Unsteadiness on feet: Secondary | ICD-10-CM | POA: Diagnosis not present

## 2021-12-31 DIAGNOSIS — Z9181 History of falling: Secondary | ICD-10-CM | POA: Diagnosis not present

## 2022-01-05 DIAGNOSIS — Z9181 History of falling: Secondary | ICD-10-CM | POA: Diagnosis not present

## 2022-01-05 DIAGNOSIS — M6281 Muscle weakness (generalized): Secondary | ICD-10-CM | POA: Diagnosis not present

## 2022-01-05 DIAGNOSIS — R2681 Unsteadiness on feet: Secondary | ICD-10-CM | POA: Diagnosis not present

## 2022-01-05 DIAGNOSIS — M545 Low back pain, unspecified: Secondary | ICD-10-CM | POA: Diagnosis not present

## 2022-01-06 ENCOUNTER — Non-Acute Institutional Stay (SKILLED_NURSING_FACILITY): Payer: Medicare Other | Admitting: Orthopedic Surgery

## 2022-01-06 ENCOUNTER — Encounter: Payer: Self-pay | Admitting: Orthopedic Surgery

## 2022-01-06 DIAGNOSIS — R413 Other amnesia: Secondary | ICD-10-CM

## 2022-01-06 DIAGNOSIS — Z9181 History of falling: Secondary | ICD-10-CM | POA: Diagnosis not present

## 2022-01-06 DIAGNOSIS — M6281 Muscle weakness (generalized): Secondary | ICD-10-CM | POA: Diagnosis not present

## 2022-01-06 DIAGNOSIS — E782 Mixed hyperlipidemia: Secondary | ICD-10-CM

## 2022-01-06 DIAGNOSIS — I5189 Other ill-defined heart diseases: Secondary | ICD-10-CM

## 2022-01-06 DIAGNOSIS — E039 Hypothyroidism, unspecified: Secondary | ICD-10-CM | POA: Diagnosis not present

## 2022-01-06 DIAGNOSIS — R600 Localized edema: Secondary | ICD-10-CM

## 2022-01-06 DIAGNOSIS — I10 Essential (primary) hypertension: Secondary | ICD-10-CM

## 2022-01-06 DIAGNOSIS — R2681 Unsteadiness on feet: Secondary | ICD-10-CM

## 2022-01-06 DIAGNOSIS — D61818 Other pancytopenia: Secondary | ICD-10-CM | POA: Diagnosis not present

## 2022-01-06 DIAGNOSIS — F418 Other specified anxiety disorders: Secondary | ICD-10-CM

## 2022-01-06 DIAGNOSIS — K219 Gastro-esophageal reflux disease without esophagitis: Secondary | ICD-10-CM | POA: Diagnosis not present

## 2022-01-06 DIAGNOSIS — G8929 Other chronic pain: Secondary | ICD-10-CM

## 2022-01-06 DIAGNOSIS — D638 Anemia in other chronic diseases classified elsewhere: Secondary | ICD-10-CM | POA: Diagnosis not present

## 2022-01-06 DIAGNOSIS — M545 Low back pain, unspecified: Secondary | ICD-10-CM

## 2022-01-06 NOTE — Progress Notes (Signed)
Location:  [facility] Room Number: N35/A Place of Service:  SNF [id number]) Provider:  Yvonna Alanis, NP   Virgie Dad, MD  Patient Care Team: Virgie Dad, MD as PCP - General (Internal Medicine) Mast, Man X, NP as Nurse Practitioner (Internal Medicine)  Extended Emergency Contact Information Primary Emergency Contact: Aki,[name] Address: [address]          [place], [place] [address] [place] Phone: [phone] Mobile Phone: [phone] Relation: Son Secondary Emergency Contact: Whitsitt, Kathi [place] Phone: [phone] Mobile Phone: [phone] Relation: Other  Code Status:  DNR Goals of care: Advanced Directive information    12/02/2021    9:57 AM  Advanced Directives  Does Patient Have a Medical Advance Directive? Yes  Type of Paramed[facility] of [place];Out of facility DNR (pink MOST or yellow form)  Does patient want to make changes to medical advance directive? No - Patient declined  Copy of [place] in Chart? Yes - validated most recent copy scanned in chart (See row information)  Pre-existing out of facility DNR order (yellow form or pink MOST form) Yellow form placed in chart (order not valid for inpatient use)     Chief Complaint  Patient presents with   Medical Management of Chronic Issues    HPI:  Pt is a 88 y.o. female seen today for medical management of chronic diseases.    She currently resides on the skilled nursing unit at [facility]. Past medical history includes: HTN, PVD, constipation, GERD, hypothyroidism, OA, osteoporosis, CKD3, cystitis, pancytopenia, depression/anxiety, weakness  Unstable gait- ambulates with wheelchair, working with PT 2x/week, lives on skilled nursing unit, no recent falls HTN- BUN/creat 16/0.9 10/08/2021, remains on losartan, NAS diet HLD- LDL 67 10/30/2021, remains on Lipitor Diastolic dysfunction- BNP 289  10/08/2021, Echo 03/18 noted normal LV systolic function, mild LV hypertrophy noted, remains in torsemide BLE- non pitting, wears teds, elevates legs in bed, see above Impaired memory- BIMS score 15 12/16/2021, reports feeling more forgetful lately- she has cried to nursing a few times about it, appropriate today  Chronic back pain- increased with movement, remains on scheduled tylenol, gabapentin and oxycodone prn Anemia/pancytopenia - Hgb 8.4 (was 8.0), Hct 26 (was 28.9), platelets 104 (was 122) 10/30/2021, no further workup, remains on ferrous sulfate Hypothyroidism- TSH 3.49 09/17/2021, remains on levothyroxine GERD- hgb 8.4 10/30/2021, remains on Prilosec Depression with anxiety- no mood changes, remains on Wellbutrin, Zoloft and xanax prn  Recent blood pressures:  06/13- 134/63  06/06- 132/50  05/30- 128/57  Recent weights:  06/01- 159.9 lbs  05/03- 160.8 lbs  04/06- 161.9 lbs    Past Medical History:  Diagnosis Date   Anemia    Anxiety    Chronotropic incompetence 12/2012    potentially medication related; noted on CPET    DDD (degenerative disc disease)    Eczema    GERD (gastroesophageal reflux disease)    H/O hiatal hernia    Hx: UTI (urinary tract infection)    Hyperlipidemia    Hypertension    Hypothyroidism    Osteopenia    Septicemia ([place]) 2002   following UTI   Vertigo, benign positional    Past Surgical History:  Procedure Laterality Date   BREAST SURGERY     left biopsy   CATARACT EXTRACTION Right    2 weeks ago   CPET / MET - PFTS     Consistent with  chronotropic incompetence; See attached report in the results section   DILATION AND CURETTAGE OF UTERUS     x 2   DOPPLER ECHOCARDIOGRAPHY  01/10/2013   Normal LV size and function. Normal EF. Air sclerosis but no stenosis   ESOPHAGOGASTRODUODENOSCOPY  05/04/2012   Procedure: ESOPHAGOGASTRODUODENOSCOPY (EGD);  Surgeon: Inda Castle, MD;  Location: [facility];  Service: Endoscopy;  Laterality:  N/A;   EYE SURGERY     cataract extraction with ILO  ? eye   IR KYPHO EA ADDL LEVEL THORACIC OR LUMBAR  10/28/2016   IR KYPHO LUMBAR INC FX REDUCE BONE BX UNI/BIL CANNULATION INC/IMAGING  10/28/2016   IR KYPHO THORACIC WITH BONE BIOPSY  12/24/2016   IR RADIOLOGIST EVAL & MGMT  11/11/2016   KNEE ARTHROSCOPY  2011   right   ORIF ANKLE FRACTURE Right 03/27/2021   Procedure: OPEN REDUCTION INTERNAL FIXATION (ORIF) ANKLE FRACTURE;  Surgeon: Marchia Bond, MD;  Location: [facility];  Service: Orthopedics;  Laterality: Right;   TONSILLECTOMY     TOTAL KNEE ARTHROPLASTY  04/24/2012   Procedure: TOTAL KNEE ARTHROPLASTY;  Surgeon: Gearlean Alf, MD;  Location: [facility];  Service: Orthopedics;  Laterality: Right;   TRANSTHORACIC ECHOCARDIOGRAM  02/2018   Ordered by PCP for "congestive heart failure " -EF 60 M 65%.  GR 1 DD.  No R WMA--- > ESSENTIALLY NORMAL    No Known Allergies  Outpatient Encounter Medications as of 01/06/2022  Medication Sig   acetaminophen (TYLENOL) 500 MG tablet Take 1,000 mg by mouth every 8 (eight) hours. For pain   Albuterol Sulfate 2.5 MG/0.5ML NEBU Inhale into the lungs every 6 (six) hours as needed (As needed for wheezing or shortness of breath).   ALPRAZolam (XANAX) 0.25 MG tablet Take 0.25 mg by mouth 2 (two) times daily as needed for anxiety. Don't give unless very anxious   atorvastatin (LIPITOR) 10 MG tablet Take 5 mg by mouth at bedtime.   buPROPion (WELLBUTRIN SR) 150 MG 12 hr tablet Take 1 tablet (150 mg total) by mouth 2 (two) times daily after a meal.   calcium citrate-vitamin D (CITRACAL+D) 315-200 MG-UNIT tablet Take 1 tablet by mouth 2 (two) times daily.   Camphor-Menthol-Methyl Sal 3.07-31-08 % PTCH Place 1 patch onto the skin See admin instructions. Apply one patch onto the skin of the  lower back daily for back pain   Carboxymethylcellulose Sodium (ARTIFICIAL TEARS OP) Place 1 drop into both eyes in the morning, at noon, and at bedtime.   diclofenac Sodium (VOLTAREN) 1  % GEL Apply 4 g topically in the morning and at bedtime. Apply to bilateral ankles BID   ferrous sulfate 325 (65 FE) MG EC tablet Take 325 mg by mouth every Monday, Wednesday, and Friday. With a meal   fish oil-omega-3 fatty acids 1000 MG capsule Take 1 g by mouth every morning.   gabapentin (NEURONTIN) 100 MG capsule Take 100 mg by mouth 3 (three) times daily.   hydrocortisone cream 1 % Apply 1 application topically daily as needed for itching.   levothyroxine (SYNTHROID) 75 MCG tablet Take 75 mcg by mouth every morning.   losartan (COZAAR) 25 MG tablet Take 25 mg by mouth every morning.   Menthol, Topical Analgesic, (BIOFREEZE) 4 % GEL Apply topically 2 (two) times daily as needed. Apply thin layer to left thigh   nystatin (MYCOSTATIN/NYSTOP) powder Apply 1 application topically 2 (two) times daily as needed (yeast).   omeprazole (PRILOSEC) 40 MG capsule Take 40 mg  by mouth every morning.   OxyCODONE HCl, Abuse Deter, (OXAYDO) 5 MG TABA Take 2.5 mg by mouth 2 (two) times daily as needed. Give 30 minutes before transportation to ortho appointments.   polyethylene glycol (MIRALAX / GLYCOLAX) 17 g packet Take 17 g by mouth daily as needed (constipation).   potassium chloride SA (KLOR-CON M) 20 MEQ tablet Take 20 mEq by mouth daily.   senna (SENOKOT) 8.6 MG TABS tablet Take 2 tablets by mouth at bedtime.   sertraline (ZOLOFT) 25 MG tablet Take 75 mg by mouth every morning.   torsemide (DEMADEX) 20 MG tablet Take 20 mg by mouth daily. Once A Day on Mon, Tue, Wed, Thu, Fri   zinc oxide 20 % ointment Apply 1 application topically See admin instructions. Apply topically to per/buttocks after every incontinent episode and as needed for redness   No facility-administered encounter medications on file as of 01/06/2022.    Review of Systems  Constitutional:  Negative for activity change, appetite change, chills, fatigue and fever.  HENT:  Negative for congestion and trouble swallowing.   Eyes:  Negative  for visual disturbance.  Respiratory:  Negative for cough, shortness of breath and wheezing.   Cardiovascular:  Positive for leg swelling. Negative for chest pain.  Gastrointestinal:  Negative for abdominal distention, abdominal pain, constipation, diarrhea, nausea and vomiting.  Genitourinary:  Negative for dysuria, frequency and hematuria.  Musculoskeletal:  Positive for arthralgias, back pain and gait problem.  Skin:  Negative for wound.  Neurological:  Positive for weakness. Negative for dizziness and headaches.  Psychiatric/Behavioral:  Negative for confusion, dysphoric mood and sleep disturbance. The patient is not nervous/anxious.     Immunization History  Administered Date(s) Administered   Influenza, High Dose Seasonal PF 04/26/2016, 03/22/2017, 05/08/2019   Influenza,inj,Quad PF,6+ Mos 04/27/2018   Influenza-Unspecified 04/22/2011, 04/04/2012, 05/03/2013, 05/04/2013, 04/17/2014, 04/19/2015, 04/26/2016, 04/22/2017, 05/20/2021   Moderna SARS-COV2 Booster Vaccination 06/09/2020, 12/31/2020   Moderna Sars-Covid-2 Vaccination 07/30/2019, 08/27/2019   Pfizer Covid-19 Vaccine Bivalent Booster 68yr & up 04/15/2021   Pneumococcal Polysaccharide-23 08/26/2003, 09/06/2011, 05/15/2019   Pneumococcal-Unspecified 12/20/2013   Td 09/26/2006   Tdap 05/15/2019   Zoster Recombinat (Shingrix) 11/21/2017, 04/08/2018   Zoster, Live 10/25/2007   Pertinent  Health Maintenance Due  Topic Date Due   INFLUENZA VACCINE  02/23/2022   DEXA SCAN  Completed      03/31/2021    1:00 AM 03/31/2021    7:47 AM 03/31/2021   10:00 PM 04/04/2021    6:13 PM 09/18/2021   12:48 PM  Fall Risk  Falls in the past year?     1  Was there an injury with Fall?     1  Fall Risk Category Calculator     3  Fall Risk Category     High  Patient Fall Risk Level High fall risk High fall risk High fall risk High fall risk High fall risk  Patient at Risk for Falls Due to     History of fall(s);Impaired balance/gait;Impaired  mobility;Orthopedic patient  Fall risk Follow up     Falls evaluation completed;Education provided;Falls prevention discussed   Functional Status Survey:    Vitals:   01/06/22 1122  BP: 134/63  Pulse: 82  Resp: 18  Temp: (!) 96.9 F (36.1 C)  SpO2: 93%  Weight: 159 lb 14.4 oz (72.5 kg)  Height: '5\' 4"'  (1.626 m)   Body mass index is 27.45 kg/m. Physical Exam Vitals reviewed.  Constitutional:      General:  She is not in acute distress. HENT:     Head: Normocephalic.     Right Ear: There is no impacted cerumen.     Left Ear: There is no impacted cerumen.     Nose: Nose normal.     Mouth/Throat:     Mouth: Mucous membranes are moist.  Eyes:     General:        Right eye: No discharge.        Left eye: No discharge.  Cardiovascular:     Rate and Rhythm: Normal rate and regular rhythm.     Pulses: Normal pulses.     Heart sounds: Normal heart sounds.  Pulmonary:     Effort: Pulmonary effort is normal. No respiratory distress.     Breath sounds: Normal breath sounds. No wheezing.  Abdominal:     General: Bowel sounds are normal. There is no distension.     Palpations: Abdomen is soft.     Tenderness: There is no abdominal tenderness.  Musculoskeletal:     Cervical back: Neck supple.     Right lower leg: Edema present.     Left lower leg: Edema present.     Comments: Non pitting   Skin:    General: Skin is warm and dry.     Capillary Refill: Capillary refill takes less than 2 seconds.  Neurological:     General: No focal deficit present.     Mental Status: She is alert and oriented to person, place, and time.     Motor: Weakness present.     Gait: Gait abnormal.     Comments: Wheelchair  Psychiatric:        Mood and Affect: Mood normal.        Behavior: Behavior normal.    Labs reviewed: Recent Labs    03/28/21 0547 03/30/21 0503 04/04/21 1827 07/10/21 0000 09/24/21 0000 10/02/21 0000 10/08/21 0000  NA 137 137 145   < > 138 137 137  K 4.0 3.5 4.5   <  > 4.3 4.0 4.4  CL 104 100 103   < > 103 102 99  CO2 '28 31 31   ' < > 25* 28* 36*  GLUCOSE 117* 101* 104*  --   --   --   --   BUN 22 36* 27*   < > 26* 19 16  CREATININE 1.00 1.31* 0.98   < > 1.2* 1.0 0.9  CALCIUM 9.2 8.9 9.7   < > 9.3 8.9 8.9   < > = values in this interval not displayed.   Recent Labs    03/27/21 0543 04/04/21 1827 07/10/21 0000 09/24/21 0000 10/08/21 0000  AST 21 37 '22 18 16  ' ALT '16 24 19 10 7  ' ALKPHOS 69 95 91 86 81  BILITOT 0.4 0.5  --   --   --   PROT 7.4 7.9  --   --   --   ALBUMIN 3.7 3.2* 3.8 3.5 3.2*   Recent Labs    03/30/21 0503 03/31/21 0444 04/03/21 0000 04/04/21 1827 07/10/21 0000 09/24/21 0000 10/02/21 0000 10/08/21 0000 10/30/21 0000  WBC 10.7* 9.3   < > 10.3   < > 12.6 10.8 11.0 5.6  NEUTROABS  --   --    < > 8.0*   < > 10,206.00 9,050.00  --  4,256.00  HGB 7.6* 8.1*   < > 8.2*   < > 8.3* 7.8* 8.0* 8.4*  HCT 24.6* 26.1*   < >  27.8*   < > 28* 26* 26* 28*  MCV 83.4 82.9  --  85.3  --   --   --   --   --   PLT 83* 97*   < > 226   < > 21* 181 122* 104*   < > = values in this interval not displayed.   Lab Results  Component Value Date   TSH 3.49 09/17/2021   No results found for: "HGBA1C" Lab Results  Component Value Date   CHOL 125 10/30/2021   HDL 38 10/30/2021   LDLCALC 67 10/30/2021   TRIG 123 10/30/2021   CHOLHDL 3.8 11/23/2018    Significant Diagnostic Results in last 30 days:  No results found.  Assessment/Plan: 1. Unstable gait - ambulates with w/c - no recent falls - working with PT 2x/week  2. Essential hypertension - controlled  - cont losartan  3. Mixed hyperlipidemia - LDL 67 10/2021 - cont Lipitor  4. Diastolic dysfunction - BNP 289 09/2021 - cont torsemide  5. Bilateral leg edema - non pitting today - cont torsemide - cont ted hose and leg elevation  6. Memory impairment - BIMS 15 11/2021 - cont skilled nursing care  7. Chronic bilateral low back pain without sciatica - cont scheduled  tylenol, gabapentin and oxycodone  8. Anemia of chronic disease - hgb stable - cont ferrous sulfate  9. Acquired hypothyroidism - TSH stable - cont levothyroxine  10. Pancytopenia ([place]) - no further workup   11. Gastroesophageal reflux disease, unspecified whether esophagitis present - cont omeprazole  12. Depression with anxiety - no mood changes - cont Zoloft, Wellbutrin and xanax    Family/ staff Communication: plan discussed with patient and nurse  Labs/tests ordered:  none

## 2022-01-12 DIAGNOSIS — M6281 Muscle weakness (generalized): Secondary | ICD-10-CM | POA: Diagnosis not present

## 2022-01-12 DIAGNOSIS — R2681 Unsteadiness on feet: Secondary | ICD-10-CM | POA: Diagnosis not present

## 2022-01-12 DIAGNOSIS — Z9181 History of falling: Secondary | ICD-10-CM | POA: Diagnosis not present

## 2022-01-12 DIAGNOSIS — M545 Low back pain, unspecified: Secondary | ICD-10-CM | POA: Diagnosis not present

## 2022-01-13 ENCOUNTER — Other Ambulatory Visit: Payer: Self-pay | Admitting: Orthopedic Surgery

## 2022-01-13 DIAGNOSIS — F418 Other specified anxiety disorders: Secondary | ICD-10-CM

## 2022-01-13 MED ORDER — ALPRAZOLAM 0.25 MG PO TABS: 0.2500 mg | ORAL_TABLET | Freq: Two times a day (BID) | ORAL | 0 refills | Status: DC | PRN

## 2022-01-14 DIAGNOSIS — M6281 Muscle weakness (generalized): Secondary | ICD-10-CM | POA: Diagnosis not present

## 2022-01-14 DIAGNOSIS — Z9181 History of falling: Secondary | ICD-10-CM | POA: Diagnosis not present

## 2022-01-14 DIAGNOSIS — M545 Low back pain, unspecified: Secondary | ICD-10-CM | POA: Diagnosis not present

## 2022-01-14 DIAGNOSIS — R2681 Unsteadiness on feet: Secondary | ICD-10-CM | POA: Diagnosis not present

## 2022-01-19 DIAGNOSIS — Z9181 History of falling: Secondary | ICD-10-CM | POA: Diagnosis not present

## 2022-01-19 DIAGNOSIS — M545 Low back pain, unspecified: Secondary | ICD-10-CM | POA: Diagnosis not present

## 2022-01-19 DIAGNOSIS — M6281 Muscle weakness (generalized): Secondary | ICD-10-CM | POA: Diagnosis not present

## 2022-01-19 DIAGNOSIS — R2681 Unsteadiness on feet: Secondary | ICD-10-CM | POA: Diagnosis not present

## 2022-01-21 DIAGNOSIS — M545 Low back pain, unspecified: Secondary | ICD-10-CM | POA: Diagnosis not present

## 2022-01-21 DIAGNOSIS — M6281 Muscle weakness (generalized): Secondary | ICD-10-CM | POA: Diagnosis not present

## 2022-01-21 DIAGNOSIS — Z9181 History of falling: Secondary | ICD-10-CM | POA: Diagnosis not present

## 2022-01-21 DIAGNOSIS — R2681 Unsteadiness on feet: Secondary | ICD-10-CM | POA: Diagnosis not present

## 2022-01-25 DIAGNOSIS — M6281 Muscle weakness (generalized): Secondary | ICD-10-CM | POA: Diagnosis not present

## 2022-01-25 DIAGNOSIS — R2681 Unsteadiness on feet: Secondary | ICD-10-CM | POA: Diagnosis not present

## 2022-01-25 DIAGNOSIS — Z9181 History of falling: Secondary | ICD-10-CM | POA: Diagnosis not present

## 2022-01-25 DIAGNOSIS — M545 Low back pain, unspecified: Secondary | ICD-10-CM | POA: Diagnosis not present

## 2022-01-27 DIAGNOSIS — Z9181 History of falling: Secondary | ICD-10-CM | POA: Diagnosis not present

## 2022-01-27 DIAGNOSIS — M545 Low back pain, unspecified: Secondary | ICD-10-CM | POA: Diagnosis not present

## 2022-01-27 DIAGNOSIS — M6281 Muscle weakness (generalized): Secondary | ICD-10-CM | POA: Diagnosis not present

## 2022-01-27 DIAGNOSIS — R2681 Unsteadiness on feet: Secondary | ICD-10-CM | POA: Diagnosis not present

## 2022-02-01 DIAGNOSIS — Z9181 History of falling: Secondary | ICD-10-CM | POA: Diagnosis not present

## 2022-02-01 DIAGNOSIS — R2681 Unsteadiness on feet: Secondary | ICD-10-CM | POA: Diagnosis not present

## 2022-02-01 DIAGNOSIS — M545 Low back pain, unspecified: Secondary | ICD-10-CM | POA: Diagnosis not present

## 2022-02-01 DIAGNOSIS — M6281 Muscle weakness (generalized): Secondary | ICD-10-CM | POA: Diagnosis not present

## 2022-02-03 DIAGNOSIS — R2681 Unsteadiness on feet: Secondary | ICD-10-CM | POA: Diagnosis not present

## 2022-02-03 DIAGNOSIS — M6281 Muscle weakness (generalized): Secondary | ICD-10-CM | POA: Diagnosis not present

## 2022-02-03 DIAGNOSIS — M545 Low back pain, unspecified: Secondary | ICD-10-CM | POA: Diagnosis not present

## 2022-02-03 DIAGNOSIS — Z9181 History of falling: Secondary | ICD-10-CM | POA: Diagnosis not present

## 2022-02-08 DIAGNOSIS — M545 Low back pain, unspecified: Secondary | ICD-10-CM | POA: Diagnosis not present

## 2022-02-08 DIAGNOSIS — R2681 Unsteadiness on feet: Secondary | ICD-10-CM | POA: Diagnosis not present

## 2022-02-08 DIAGNOSIS — M6281 Muscle weakness (generalized): Secondary | ICD-10-CM | POA: Diagnosis not present

## 2022-02-08 DIAGNOSIS — Z9181 History of falling: Secondary | ICD-10-CM | POA: Diagnosis not present

## 2022-02-10 DIAGNOSIS — Z9181 History of falling: Secondary | ICD-10-CM | POA: Diagnosis not present

## 2022-02-10 DIAGNOSIS — M6281 Muscle weakness (generalized): Secondary | ICD-10-CM | POA: Diagnosis not present

## 2022-02-10 DIAGNOSIS — R2681 Unsteadiness on feet: Secondary | ICD-10-CM | POA: Diagnosis not present

## 2022-02-10 DIAGNOSIS — M545 Low back pain, unspecified: Secondary | ICD-10-CM | POA: Diagnosis not present

## 2022-02-15 DIAGNOSIS — M545 Low back pain, unspecified: Secondary | ICD-10-CM | POA: Diagnosis not present

## 2022-02-15 DIAGNOSIS — R2681 Unsteadiness on feet: Secondary | ICD-10-CM | POA: Diagnosis not present

## 2022-02-15 DIAGNOSIS — M6281 Muscle weakness (generalized): Secondary | ICD-10-CM | POA: Diagnosis not present

## 2022-02-15 DIAGNOSIS — Z9181 History of falling: Secondary | ICD-10-CM | POA: Diagnosis not present

## 2022-02-16 DIAGNOSIS — N390 Urinary tract infection, site not specified: Secondary | ICD-10-CM | POA: Diagnosis not present

## 2022-02-17 DIAGNOSIS — R2681 Unsteadiness on feet: Secondary | ICD-10-CM | POA: Diagnosis not present

## 2022-02-17 DIAGNOSIS — M545 Low back pain, unspecified: Secondary | ICD-10-CM | POA: Diagnosis not present

## 2022-02-17 DIAGNOSIS — M6281 Muscle weakness (generalized): Secondary | ICD-10-CM | POA: Diagnosis not present

## 2022-02-17 DIAGNOSIS — Z9181 History of falling: Secondary | ICD-10-CM | POA: Diagnosis not present

## 2022-02-18 ENCOUNTER — Encounter: Payer: Self-pay | Admitting: Internal Medicine

## 2022-02-18 ENCOUNTER — Non-Acute Institutional Stay (SKILLED_NURSING_FACILITY): Payer: Medicare Other | Admitting: Internal Medicine

## 2022-02-18 DIAGNOSIS — G8929 Other chronic pain: Secondary | ICD-10-CM

## 2022-02-18 DIAGNOSIS — E782 Mixed hyperlipidemia: Secondary | ICD-10-CM

## 2022-02-18 DIAGNOSIS — F418 Other specified anxiety disorders: Secondary | ICD-10-CM | POA: Diagnosis not present

## 2022-02-18 DIAGNOSIS — R2681 Unsteadiness on feet: Secondary | ICD-10-CM

## 2022-02-18 DIAGNOSIS — E039 Hypothyroidism, unspecified: Secondary | ICD-10-CM | POA: Diagnosis not present

## 2022-02-18 DIAGNOSIS — M545 Low back pain, unspecified: Secondary | ICD-10-CM | POA: Diagnosis not present

## 2022-02-18 DIAGNOSIS — I10 Essential (primary) hypertension: Secondary | ICD-10-CM | POA: Diagnosis not present

## 2022-02-18 DIAGNOSIS — N3000 Acute cystitis without hematuria: Secondary | ICD-10-CM

## 2022-02-18 DIAGNOSIS — D638 Anemia in other chronic diseases classified elsewhere: Secondary | ICD-10-CM

## 2022-02-18 DIAGNOSIS — I5189 Other ill-defined heart diseases: Secondary | ICD-10-CM

## 2022-02-18 NOTE — Progress Notes (Signed)
Location:   [facility] Room Number: [place] of Service:  SNF [phone]) Provider:  Veleta Miners, MD  Virgie Dad, MD  Patient Care Team: Virgie Dad, MD as PCP - General (Internal Medicine) Mast, Man X, NP as Nurse Practitioner (Internal Medicine)  Extended Emergency Contact Information Primary Emergency Contact: Jann,[name] Address: [address]          [place], [place] [address] [place] Phone: [phone] Mobile Phone: [phone] Relation: Son Secondary Emergency Contact: Schirmer, Kathi [place] Phone: [phone] Mobile Phone: [phone] Relation: Other  Code Status:  DNR Goals of care: Advanced Directive information    02/18/2022   12:22 PM  Advanced Directives  Does Patient Have a Medical Advance Directive? Yes  Type of Par[employer] of [place];Out of facility DNR (pink MOST or yellow form)  Does patient want to make changes to medical advance directive? No - Patient declined  Copy of [place] in Chart? Yes - validated most recent copy scanned in chart (See row information)  Pre-existing out of facility DNR order (yellow form or pink MOST form) Pink MOST form placed in chart (order not valid for inpatient use);Yellow form placed in chart (order not valid for inpatient use)     Chief Complaint  Patient presents with   Medical Management of Chronic Issues    Routine follow up visit.   Immunizations    Pneumonia vaccine due    HPI:  Pt is a 88 y.o. female seen today for medical management of chronic diseases.    Lives in SNF Patient has h/o Pancytopenia , h/o Chronic Back Pain, Hypertension, Hypothyroidism, Hypertension, Recurrent Cystitis, Osteoporosis and Major Depression   Right Trimalleolar Fracture. S/P ORIF in 09/22  Dysuria Patient had a urine done which was came positive for Klebsiella.  More than 100,000 colony. Unstable gait Since her ORIF  patient has not been able to walk without assist she feels very anxious about it next Continues to have low back pain and also burning pain in both her feet specially at night Wt Readings from Last 3 Encounters:  02/18/22 158 lb 11.2 oz (72 kg)  01/06/22 159 lb 14.4 oz (72.5 kg)  12/02/21 160 lb 11.2 oz (72.9 kg)  Has lost some weight. Can do her transfers with assist. No falls Continues to struggle with depression Cognitively continues to do well.    Past Medical History:  Diagnosis Date   Anemia    Anxiety    Chronotropic incompetence 12/2012    potentially medication related; noted on CPET    DDD (degenerative disc disease)    Eczema    GERD (gastroesophageal reflux disease)    H/O hiatal hernia    Hx: UTI (urinary tract infection)    Hyperlipidemia    Hypertension    Hypothyroidism    Osteopenia    Septicemia ([place]) 2002   following UTI   Vertigo, benign positional    Past Surgical History:  Procedure Laterality Date   BREAST SURGERY     left biopsy   CATARACT EXTRACTION Right    2 weeks ago   CPET / MET - PFTS     Consistent with chronotropic incompetence; See attached report in the results section   DILATION AND CURETTAGE OF UTERUS     x 2   DOPPLER ECHOCARDIOGRAPHY  01/10/2013   Normal LV size and function. Normal EF. Air sclerosis but no stenosis  ESOPHAGOGASTRODUODENOSCOPY  05/04/2012   Procedure: ESOPHAGOGASTRODUODENOSCOPY (EGD);  Surgeon: Inda Castle, MD;  Location: [facility];  Service: Endoscopy;  Laterality: N/A;   EYE SURGERY     cataract extraction with ILO  ? eye   IR KYPHO EA ADDL LEVEL THORACIC OR LUMBAR  10/28/2016   IR KYPHO LUMBAR INC FX REDUCE BONE BX UNI/BIL CANNULATION INC/IMAGING  10/28/2016   IR KYPHO THORACIC WITH BONE BIOPSY  12/24/2016   IR RADIOLOGIST EVAL & MGMT  11/11/2016   KNEE ARTHROSCOPY  2011   right   ORIF ANKLE FRACTURE Right 03/27/2021   Procedure: OPEN REDUCTION INTERNAL FIXATION (ORIF) ANKLE FRACTURE;  Surgeon: Marchia Bond, MD;  Location: [facility];  Service: Orthopedics;  Laterality: Right;   TONSILLECTOMY     TOTAL KNEE ARTHROPLASTY  04/24/2012   Procedure: TOTAL KNEE ARTHROPLASTY;  Surgeon: Gearlean Alf, MD;  Location: [facility];  Service: Orthopedics;  Laterality: Right;   TRANSTHORACIC ECHOCARDIOGRAM  02/2018   Ordered by PCP for "congestive heart failure " -EF 60 M 65%.  GR 1 DD.  No R WMA--- > ESSENTIALLY NORMAL    No Known Allergies  Allergies as of 02/18/2022   No Known Allergies      Medication List        Accurate as of February 18, 2022 12:23 PM. If you have any questions, ask your nurse or doctor.          acetaminophen 500 MG tablet Commonly known as: TYLENOL Take 1,000 mg by mouth every 8 (eight) hours. For pain   Albuterol Sulfate 2.5 MG/0.5ML Nebu Inhale into the lungs every 6 (six) hours as needed (As needed for wheezing or shortness of breath).   ALPRAZolam 0.25 MG tablet Commonly known as: XANAX Take 1 tablet (0.25 mg total) by mouth 2 (two) times daily as needed for anxiety. Don't give unless very anxious   ARTIFICIAL TEARS OP Place 1 drop into both eyes in the morning, at noon, and at bedtime.   atorvastatin 10 MG tablet Commonly known as: LIPITOR Take 5 mg by mouth at bedtime.   Biofreeze 4 % Gel Generic drug: Menthol (Topical Analgesic) Apply topically 2 (two) times daily as needed. Apply thin layer to left thigh   buPROPion 150 MG 12 hr tablet Commonly known as: WELLBUTRIN SR Take 1 tablet (150 mg total) by mouth 2 (two) times daily after a meal.   calcium citrate-vitamin D 315-200 MG-UNIT tablet Commonly known as: CITRACAL+D Take 1 tablet by mouth 2 (two) times daily.   Camphor-Menthol-Methyl Sal 3.07-31-08 % Ptch Place 1 patch onto the skin See admin instructions. Apply one patch onto the skin of the  lower back daily for back pain   diclofenac Sodium 1 % Gel Commonly known as: VOLTAREN Apply 4 g topically in the morning and at bedtime. Apply to  bilateral ankles BID   ferrous sulfate 325 (65 FE) MG EC tablet Take 325 mg by mouth every Monday, Wednesday, and Friday. With a meal   fish oil-omega-3 fatty acids 1000 MG capsule Take 1 g by mouth every morning.   gabapentin 100 MG capsule Commonly known as: NEURONTIN Take 100 mg by mouth 3 (three) times daily.   hydrocortisone cream 1 % Apply 1 application topically daily as needed for itching.   levothyroxine 75 MCG tablet Commonly known as: SYNTHROID Take 75 mcg by mouth every morning.   losartan 25 MG tablet Commonly known as: COZAAR Take 25 mg by mouth every morning.  nystatin powder Commonly known as: MYCOSTATIN/NYSTOP Apply 1 application topically 2 (two) times daily as needed (yeast).   nystatin cream Commonly known as: MYCOSTATIN Apply 1 Application topically 2 (two) times daily.   omeprazole 40 MG capsule Commonly known as: PRILOSEC Take 40 mg by mouth every morning.   OxyCODONE HCl (Abuse Deter) 5 MG Taba Commonly known as: OXAYDO Take 2.5 mg by mouth 2 (two) times daily as needed. Give 30 minutes before transportation to ortho appointments.   polyethylene glycol 17 g packet Commonly known as: MIRALAX / GLYCOLAX Take 17 g by mouth daily as needed (constipation).   potassium chloride SA 20 MEQ tablet Commonly known as: KLOR-CON M Take 20 mEq by mouth daily.   senna 8.6 MG Tabs tablet Commonly known as: SENOKOT Take 2 tablets by mouth at bedtime.   sertraline 25 MG tablet Commonly known as: ZOLOFT Take 75 mg by mouth every morning.   torsemide 20 MG tablet Commonly known as: DEMADEX Take 20 mg by mouth daily. Once A Day on Mon, Tue, Wed, Thu, Fri   zinc oxide 20 % ointment Apply 1 application topically See admin instructions. Apply topically to per/buttocks after every incontinent episode and as needed for redness        Review of Systems  Constitutional:  Positive for activity change. Negative for appetite change.  HENT: Negative.     Respiratory:  Negative for cough and shortness of breath.   Cardiovascular:  Negative for leg swelling.  Gastrointestinal:  Negative for constipation.  Genitourinary: Negative.   Musculoskeletal:  Positive for back pain and gait problem. Negative for arthralgias and myalgias.  Skin: Negative.   Neurological:  Positive for numbness. Negative for dizziness and weakness.  Psychiatric/Behavioral:  Negative for confusion, dysphoric mood and sleep disturbance.     Immunization History  Administered Date(s) Administered   Influenza, High Dose Seasonal PF 04/26/2016, 03/22/2017, 05/08/2019   Influenza,inj,Quad PF,6+ Mos 04/27/2018   Influenza-Unspecified 04/22/2011, 04/04/2012, 05/03/2013, 05/04/2013, 04/17/2014, 04/19/2015, 04/26/2016, 04/22/2017, 05/20/2021   Moderna SARS-COV2 Booster Vaccination 06/09/2020, 12/31/2020   Moderna Sars-Covid-2 Vaccination 07/30/2019, 08/27/2019   Pfizer Covid-19 Vaccine Bivalent Booster 38yr & up 04/15/2021   Pneumococcal Polysaccharide-23 08/26/2003, 09/06/2011, 05/15/2019   Pneumococcal-Unspecified 12/20/2013   Td 09/26/2006   Tdap 05/15/2019   Zoster Recombinat (Shingrix) 11/21/2017, 04/08/2018   Zoster, Live 10/25/2007   Pertinent  Health Maintenance Due  Topic Date Due   INFLUENZA VACCINE  02/23/2022   DEXA SCAN  Completed      03/31/2021    1:00 AM 03/31/2021    7:47 AM 03/31/2021   10:00 PM 04/04/2021    6:13 PM 09/18/2021   12:48 PM  Fall Risk  Falls in the past year?     1  Was there an injury with Fall?     1  Fall Risk Category Calculator     3  Fall Risk Category     High  Patient Fall Risk Level _0   Patient at Risk for Falls Due to     History of fall(s);Impaired balance/gait;Impaired mobility;Orthopedic patient  Fall risk Follow up     Falls evaluation completed;Education provided;Falls prevention discussed   Functional Status Survey:    Vitals:   02/18/22 1209   BP: 112/62  Pulse: 85  Resp: 20  Temp: (!) 96 F (35.6 C)  SpO2: 94%  Weight: 158 lb 11.2 oz (72 kg)  Height: _1  (  1.626 m)   Body mass index is 27.24 kg/m. Physical Exam Vitals reviewed.  Constitutional:      Appearance: Normal appearance.  HENT:     Head: Normocephalic.     Nose: Nose normal.     Mouth/Throat:     Mouth: Mucous membranes are moist.     Pharynx: Oropharynx is clear.  Eyes:     Pupils: Pupils are equal, round, and reactive to light.  Cardiovascular:     Rate and Rhythm: Normal rate and regular rhythm.     Pulses: Normal pulses.     Heart sounds: Normal heart sounds. No murmur heard. Pulmonary:     Effort: Pulmonary effort is normal.     Breath sounds: Normal breath sounds.  Abdominal:     General: Abdomen is flat. Bowel sounds are normal.     Palpations: Abdomen is soft.  Musculoskeletal:        General: No swelling.     Cervical back: Neck supple.  Skin:    General: Skin is warm.  Neurological:     General: No focal deficit present.     Mental Status: She is alert and oriented to person, place, and time.  Psychiatric:        Mood and Affect: Mood normal.        Thought Content: Thought content normal.     Labs reviewed: Recent Labs    03/28/21 0547 03/30/21 0503 04/04/21 1827 07/10/21 0000 09/24/21 0000 10/02/21 0000 10/08/21 0000  NA 137 137 145   < > 138 137 137  K 4.0 3.5 4.5   < > 4.3 4.0 4.4  CL 104 100 103   < > 103 102 99  CO2 _0 < > 25* 28* 36*  GLUCOSE 117* 101* 104*  --   --   --   --   BUN 22 36* 27*   < > 26* 19 16  CREATININE 1.00 1.31* 0.98   < > 1.2* 1.0 0.9  CALCIUM 9.2 8.9 9.7   < > 9.3 8.9 8.9   < > = values in this interval not displayed.   Recent Labs    03/27/21 0543 04/04/21 1827 07/10/21 0000 09/24/21 0000 10/08/21 0000  AST 21 37 _1 ALT _2 ALKPHOS 69 95 91 86 81  BILITOT 0.4 0.5  --   --   --   PROT 7.4 7.9  --   --   --   ALBUMIN 3.7 3.2* 3.8 3.5 3.2*   Recent  Labs    03/30/21 0503 03/31/21 0444 04/03/21 0000 04/04/21 1827 07/10/21 0000 09/24/21 0000 10/02/21 0000 10/08/21 0000 10/30/21 0000  WBC 10.7* 9.3   < > 10.3   < > 12.6 10.8 11.0 5.6  NEUTROABS  --   --    < > 8.0*   < > 10,206.00 9,050.00  --  4,256.00  HGB 7.6* 8.1*   < > 8.2*   < > 8.3* 7.8* 8.0* 8.4*  HCT 24.6* 26.1*   < > 27.8*   < > 28* 26* 26* 28*  MCV 83.4 82.9  --  85.3  --   --   --   --   --   PLT 83* 97*   < > 226   < > 21* 181 122* 104*   < > = values in this interval not displayed.   Lab Results  Component Value Date  TSH 3.49 09/17/2021   No results found for: "HGBA1C" Lab Results  Component Value Date   CHOL 125 10/30/2021   HDL 38 10/30/2021   LDLCALC 67 10/30/2021   TRIG 123 10/30/2021   CHOLHDL 3.8 11/23/2018    Significant Diagnostic Results in last 30 days:  No results found.  Assessment/Plan  1. Acute cystitis without hematuria Keflex 500 TID 5 days Encourage PO fluids 2. Chronic bilateral low back pain without sciatica Increase Neurontin at night to 200 mg  3. Depression with anxiety Stable on Wellbutrin and Zoloft Also Needs Xanax PRN  4. Unstable gait Is working with therapy I have talked to therapy to evaluate her for Power Chair  5. Essential hypertension Continue Cozaar  6. Diastolic dysfunction On low-dose of Demadex  7. Mixed hyperlipidemia On statin LDL good level in 4/23  8. Anemia of chronic disease Repeat CBC Has seen hematology before.  No further work-up On iron  9. Acquired hypothyroidism TSH normal 2/23   Family/ staff Communication:   Labs/tests ordered:  CBC,BMP

## 2022-02-22 DIAGNOSIS — M6281 Muscle weakness (generalized): Secondary | ICD-10-CM | POA: Diagnosis not present

## 2022-02-22 DIAGNOSIS — I10 Essential (primary) hypertension: Secondary | ICD-10-CM | POA: Diagnosis not present

## 2022-02-22 DIAGNOSIS — M545 Low back pain, unspecified: Secondary | ICD-10-CM | POA: Diagnosis not present

## 2022-02-22 DIAGNOSIS — Z9181 History of falling: Secondary | ICD-10-CM | POA: Diagnosis not present

## 2022-02-22 DIAGNOSIS — R2681 Unsteadiness on feet: Secondary | ICD-10-CM | POA: Diagnosis not present

## 2022-02-23 DIAGNOSIS — Z9181 History of falling: Secondary | ICD-10-CM | POA: Diagnosis not present

## 2022-02-23 DIAGNOSIS — M6281 Muscle weakness (generalized): Secondary | ICD-10-CM | POA: Diagnosis not present

## 2022-02-23 DIAGNOSIS — R293 Abnormal posture: Secondary | ICD-10-CM | POA: Diagnosis not present

## 2022-02-24 DIAGNOSIS — R2681 Unsteadiness on feet: Secondary | ICD-10-CM | POA: Diagnosis not present

## 2022-02-24 DIAGNOSIS — M545 Low back pain, unspecified: Secondary | ICD-10-CM | POA: Diagnosis not present

## 2022-02-24 DIAGNOSIS — Z9181 History of falling: Secondary | ICD-10-CM | POA: Diagnosis not present

## 2022-02-24 DIAGNOSIS — M6281 Muscle weakness (generalized): Secondary | ICD-10-CM | POA: Diagnosis not present

## 2022-03-01 DIAGNOSIS — Z9181 History of falling: Secondary | ICD-10-CM | POA: Diagnosis not present

## 2022-03-01 DIAGNOSIS — M6281 Muscle weakness (generalized): Secondary | ICD-10-CM | POA: Diagnosis not present

## 2022-03-01 DIAGNOSIS — R2681 Unsteadiness on feet: Secondary | ICD-10-CM | POA: Diagnosis not present

## 2022-03-01 DIAGNOSIS — M545 Low back pain, unspecified: Secondary | ICD-10-CM | POA: Diagnosis not present

## 2022-03-02 DIAGNOSIS — Z9181 History of falling: Secondary | ICD-10-CM | POA: Diagnosis not present

## 2022-03-02 DIAGNOSIS — R293 Abnormal posture: Secondary | ICD-10-CM | POA: Diagnosis not present

## 2022-03-02 DIAGNOSIS — M6281 Muscle weakness (generalized): Secondary | ICD-10-CM | POA: Diagnosis not present

## 2022-03-03 DIAGNOSIS — M6281 Muscle weakness (generalized): Secondary | ICD-10-CM | POA: Diagnosis not present

## 2022-03-03 DIAGNOSIS — M545 Low back pain, unspecified: Secondary | ICD-10-CM | POA: Diagnosis not present

## 2022-03-03 DIAGNOSIS — Z9181 History of falling: Secondary | ICD-10-CM | POA: Diagnosis not present

## 2022-03-03 DIAGNOSIS — R2681 Unsteadiness on feet: Secondary | ICD-10-CM | POA: Diagnosis not present

## 2022-03-05 DIAGNOSIS — Z9181 History of falling: Secondary | ICD-10-CM | POA: Diagnosis not present

## 2022-03-05 DIAGNOSIS — R293 Abnormal posture: Secondary | ICD-10-CM | POA: Diagnosis not present

## 2022-03-05 DIAGNOSIS — M6281 Muscle weakness (generalized): Secondary | ICD-10-CM | POA: Diagnosis not present

## 2022-03-08 DIAGNOSIS — R2681 Unsteadiness on feet: Secondary | ICD-10-CM | POA: Diagnosis not present

## 2022-03-08 DIAGNOSIS — M6281 Muscle weakness (generalized): Secondary | ICD-10-CM | POA: Diagnosis not present

## 2022-03-08 DIAGNOSIS — R293 Abnormal posture: Secondary | ICD-10-CM | POA: Diagnosis not present

## 2022-03-08 DIAGNOSIS — Z9181 History of falling: Secondary | ICD-10-CM | POA: Diagnosis not present

## 2022-03-08 DIAGNOSIS — M545 Low back pain, unspecified: Secondary | ICD-10-CM | POA: Diagnosis not present

## 2022-03-10 DIAGNOSIS — R2681 Unsteadiness on feet: Secondary | ICD-10-CM | POA: Diagnosis not present

## 2022-03-10 DIAGNOSIS — M545 Low back pain, unspecified: Secondary | ICD-10-CM | POA: Diagnosis not present

## 2022-03-10 DIAGNOSIS — M6281 Muscle weakness (generalized): Secondary | ICD-10-CM | POA: Diagnosis not present

## 2022-03-10 DIAGNOSIS — Z9181 History of falling: Secondary | ICD-10-CM | POA: Diagnosis not present

## 2022-03-15 DIAGNOSIS — Z9181 History of falling: Secondary | ICD-10-CM | POA: Diagnosis not present

## 2022-03-15 DIAGNOSIS — R2681 Unsteadiness on feet: Secondary | ICD-10-CM | POA: Diagnosis not present

## 2022-03-15 DIAGNOSIS — R293 Abnormal posture: Secondary | ICD-10-CM | POA: Diagnosis not present

## 2022-03-15 DIAGNOSIS — M545 Low back pain, unspecified: Secondary | ICD-10-CM | POA: Diagnosis not present

## 2022-03-15 DIAGNOSIS — M6281 Muscle weakness (generalized): Secondary | ICD-10-CM | POA: Diagnosis not present

## 2022-03-17 ENCOUNTER — Non-Acute Institutional Stay (SKILLED_NURSING_FACILITY): Payer: Medicare Other | Admitting: Orthopedic Surgery

## 2022-03-17 ENCOUNTER — Encounter: Payer: Self-pay | Admitting: Orthopedic Surgery

## 2022-03-17 DIAGNOSIS — R600 Localized edema: Secondary | ICD-10-CM

## 2022-03-17 DIAGNOSIS — D638 Anemia in other chronic diseases classified elsewhere: Secondary | ICD-10-CM

## 2022-03-17 DIAGNOSIS — R634 Abnormal weight loss: Secondary | ICD-10-CM | POA: Diagnosis not present

## 2022-03-17 DIAGNOSIS — J302 Other seasonal allergic rhinitis: Secondary | ICD-10-CM

## 2022-03-17 DIAGNOSIS — I10 Essential (primary) hypertension: Secondary | ICD-10-CM | POA: Diagnosis not present

## 2022-03-17 DIAGNOSIS — R2681 Unsteadiness on feet: Secondary | ICD-10-CM

## 2022-03-17 DIAGNOSIS — Z9181 History of falling: Secondary | ICD-10-CM | POA: Diagnosis not present

## 2022-03-17 DIAGNOSIS — M545 Low back pain, unspecified: Secondary | ICD-10-CM

## 2022-03-17 DIAGNOSIS — I5189 Other ill-defined heart diseases: Secondary | ICD-10-CM

## 2022-03-17 DIAGNOSIS — R293 Abnormal posture: Secondary | ICD-10-CM | POA: Diagnosis not present

## 2022-03-17 DIAGNOSIS — E782 Mixed hyperlipidemia: Secondary | ICD-10-CM | POA: Diagnosis not present

## 2022-03-17 DIAGNOSIS — E039 Hypothyroidism, unspecified: Secondary | ICD-10-CM

## 2022-03-17 DIAGNOSIS — F418 Other specified anxiety disorders: Secondary | ICD-10-CM

## 2022-03-17 DIAGNOSIS — R413 Other amnesia: Secondary | ICD-10-CM

## 2022-03-17 DIAGNOSIS — G8929 Other chronic pain: Secondary | ICD-10-CM

## 2022-03-17 DIAGNOSIS — M6281 Muscle weakness (generalized): Secondary | ICD-10-CM | POA: Diagnosis not present

## 2022-03-17 NOTE — Progress Notes (Signed)
Location:   [facility] Room Number: 35-A Place of Service:  SNF [phone]) Provider:  Windell Moulding, NP  PCP: Virgie Dad, MD  Patient Care Team: Virgie Dad, MD as PCP - General (Internal Medicine) Mast, Man X, NP as Nurse Practitioner (Internal Medicine)  Extended Emergency Contact Information Primary Emergency Contact: Sheehy,[name] Address: [address]          [place], [place] [address] [place] Phone: [phone] Mobile Phone: [phone] Relation: Son Secondary Emergency Contact: Griswold, Kathi Der [place] Phone: [phone] Mobile Phone: [phone] Relation: Other  Code Status:  DNR Goals of care: Advanced Directive information    03/17/2022   11:41 AM  Advanced Directives  Does Patient Have a Medical Advance Directive? Yes  Type of Paramedic of [place];Living will;Out of facility DNR (pink MOST or yellow form)  Does patient want to make changes to medical advance directive? No - Patient declined  Copy of [place] in Chart? Yes - validated most recent copy scanned in chart (See row information)     Chief Complaint  Patient presents with   Medical Management of Chronic Issues    Routine Visit.    HPI:  Pt is a 88 y.o. female seen today for medical management of chronic diseases.    She currently resides on the skilled nursing unit at [facility]. Past medical history includes: HTN, PVD, constipation, GERD, hypothyroidism, OA, osteoporosis, CKD3, cystitis, pancytopenia, depression/anxiety, weakness   Allergies- increased nasal congestion/watery eyes x 1 week, reports intermittent allergies in past  Weight loss- see trends below, reports trying to lose extra weight and limiting snacking HTN- BUN/creat 24/10.3 02/22/2022, remains on losartan, NAS diet HLD- LDL 67 10/30/2021, remains on Lipitor Diastolic dysfunction- BNP 289 10/08/2021, Echo 03/18 noted  normal LV systolic function, mild LV hypertrophy noted, remains in torsemide BLE- non pitting, wears teds, elevates legs in bed, see above Impaired memory- BIMS score 13 03/09/2022, has periods of increased anxiety/confusion Unstable gait- ambulates with wheelchair, working with PT 2x/week, lives on skilled nursing unit, no recent falls Chronic back pain- increased with movement, remains on scheduled tylenol, gabapentin and oxycodone prn Anemia -h/o pancytopenia- no further workup, hgb 9.3 (07/31)> was hgb 8.4, Hct 29.4 > was 26, platelets 96 > was 104, no further workup, remains on ferrous sulfate Hypothyroidism- TSH 3.49 09/17/2021, remains on levothyroxine GERD- hgb 9.3 02/22/2022, remains on Prilosec Depression with anxiety- no mood changes, remains on Wellbutrin, Zoloft and xanax prn  Acute cystitis- > 100,000 cfu/ML klebsiella pneumoniae 02/16/2022, resolved with Keflex 500 mg TID x 5 days.   Recent blood pressures:  08/22- 132/55  08/15- 106/61  08/08- 130/55  Recent weights:  08/02- 151.7 lbs  07/01- 158.7 lbs  06/01- 159.9  02/01- 168.8 lbs  Past Medical History:  Diagnosis Date   Anemia    Anxiety    Chronotropic incompetence 12/2012    potentially medication related; noted on CPET    DDD (degenerative disc disease)    Eczema    GERD (gastroesophageal reflux disease)    H/O hiatal hernia    Hx: UTI (urinary tract infection)    Hyperlipidemia    Hypertension    Hypothyroidism    Osteopenia    Septicemia ([place]) 2002   following UTI   Vertigo, benign positional    Past Surgical History:  Procedure Laterality Date   BREAST SURGERY     left biopsy  CATARACT EXTRACTION Right    2 weeks ago   CPET / MET - PFTS     Consistent with chronotropic incompetence; See attached report in the results section   DILATION AND CURETTAGE OF UTERUS     x 2   DOPPLER ECHOCARDIOGRAPHY  01/10/2013   Normal LV size and function. Normal EF. Air sclerosis but no stenosis    ESOPHAGOGASTRODUODENOSCOPY  05/04/2012   Procedure: ESOPHAGOGASTRODUODENOSCOPY (EGD);  Surgeon: Inda Castle, MD;  Location: [facility];  Service: Endoscopy;  Laterality: N/A;   EYE SURGERY     cataract extraction with ILO  ? eye   IR KYPHO EA ADDL LEVEL THORACIC OR LUMBAR  10/28/2016   IR KYPHO LUMBAR INC FX REDUCE BONE BX UNI/BIL CANNULATION INC/IMAGING  10/28/2016   IR KYPHO THORACIC WITH BONE BIOPSY  12/24/2016   IR RADIOLOGIST EVAL & MGMT  11/11/2016   KNEE ARTHROSCOPY  2011   right   ORIF ANKLE FRACTURE Right 03/27/2021   Procedure: OPEN REDUCTION INTERNAL FIXATION (ORIF) ANKLE FRACTURE;  Surgeon: Marchia Bond, MD;  Location: [facility];  Service: Orthopedics;  Laterality: Right;   TONSILLECTOMY     TOTAL KNEE ARTHROPLASTY  04/24/2012   Procedure: TOTAL KNEE ARTHROPLASTY;  Surgeon: Gearlean Alf, MD;  Location: [facility];  Service: Orthopedics;  Laterality: Right;   TRANSTHORACIC ECHOCARDIOGRAM  02/2018   Ordered by PCP for "congestive heart failure " -EF 60 M 65%.  GR 1 DD.  No R WMA--- > ESSENTIALLY NORMAL    No Known Allergies  Allergies as of 03/17/2022   No Known Allergies      Medication List        Accurate as of March 17, 2022 11:41 AM. If you have any questions, ask your nurse or doctor.          acetaminophen 500 MG tablet Commonly known as: TYLENOL Take 1,000 mg by mouth every 8 (eight) hours. For pain   Albuterol Sulfate 2.5 MG/0.5ML Nebu Inhale into the lungs every 6 (six) hours as needed (As needed for wheezing or shortness of breath).   ALPRAZolam 0.25 MG tablet Commonly known as: XANAX Take 1 tablet (0.25 mg total) by mouth 2 (two) times daily as needed for anxiety. Don't give unless very anxious   ARTIFICIAL TEARS OP Place 1 drop into both eyes in the morning, at noon, and at bedtime.   atorvastatin 10 MG tablet Commonly known as: LIPITOR Take 5 mg by mouth at bedtime.   Biofreeze 4 % Gel Generic drug: Menthol (Topical Analgesic) Apply topically  2 (two) times daily as needed. Apply thin layer to left thigh   buPROPion 150 MG 12 hr tablet Commonly known as: WELLBUTRIN SR Take 1 tablet (150 mg total) by mouth 2 (two) times daily after a meal.   calcium citrate-vitamin D 315-200 MG-UNIT tablet Commonly known as: CITRACAL+D Take 1 tablet by mouth 2 (two) times daily.   Camphor-Menthol-Methyl Sal 3.07-31-08 % Ptch Place 1 patch onto the skin See admin instructions. Apply one patch onto the skin of the  lower back daily for back pain   diclofenac Sodium 1 % Gel Commonly known as: VOLTAREN Apply 4 g topically in the morning and at bedtime. Apply to bilateral ankles BID   ferrous sulfate 325 (65 FE) MG EC tablet Take 325 mg by mouth every Monday, Wednesday, and Friday. With a meal   fish oil-omega-3 fatty acids 1000 MG capsule Take 1 g by mouth every morning.   gabapentin  100 MG capsule Commonly known as: NEURONTIN Take 100 mg by mouth 2 (two) times daily.   gabapentin 100 MG capsule Commonly known as: NEURONTIN Take 100 mg by mouth daily.   hydrocortisone cream 1 % Apply 1 application topically daily as needed for itching.   levothyroxine 75 MCG tablet Commonly known as: SYNTHROID Take 75 mcg by mouth every morning.   losartan 25 MG tablet Commonly known as: COZAAR Take 25 mg by mouth every morning.   nystatin powder Commonly known as: MYCOSTATIN/NYSTOP Apply 1 application topically 2 (two) times daily as needed (yeast).   nystatin cream Commonly known as: MYCOSTATIN Apply 1 Application topically 3 (three) times daily.   omeprazole 40 MG capsule Commonly known as: PRILOSEC Take 40 mg by mouth every morning.   OxyCODONE HCl (Abuse Deter) 5 MG Taba Commonly known as: OXAYDO Take 2.5 mg by mouth 2 (two) times daily as needed. Give 30 minutes before transportation to ortho appointments.   polyethylene glycol 17 g packet Commonly known as: MIRALAX / GLYCOLAX Take 17 g by mouth every other day.   potassium  chloride SA 20 MEQ tablet Commonly known as: KLOR-CON M Take 20 mEq by mouth daily.   senna 8.6 MG Tabs tablet Commonly known as: SENOKOT Take 2 tablets by mouth at bedtime.   sertraline 25 MG tablet Commonly known as: ZOLOFT Take 75 mg by mouth every morning.   torsemide 20 MG tablet Commonly known as: DEMADEX Take 20 mg by mouth daily. Once A Day on Mon, Tue, Wed, Thu, Fri   zinc oxide 20 % ointment Apply 1 application topically See admin instructions. Apply topically to per/buttocks after every incontinent episode and as needed for redness        Review of Systems  Constitutional:  Negative for activity change, appetite change, chills, fatigue and fever.  HENT:  Positive for congestion. Negative for sinus pressure, sinus pain, sore throat and trouble swallowing.   Eyes:  Positive for discharge. Negative for visual disturbance.  Respiratory:  Negative for cough, shortness of breath and wheezing.   Cardiovascular:  Positive for leg swelling. Negative for chest pain.  Gastrointestinal:  Negative for abdominal distention, abdominal pain, constipation, diarrhea, nausea and vomiting.  Genitourinary:  Negative for dysuria, frequency and hematuria.  Musculoskeletal:  Positive for arthralgias and gait problem.  Skin:  Negative for wound.  Neurological:  Positive for weakness. Negative for dizziness and headaches.  Psychiatric/Behavioral:  Positive for confusion and dysphoric mood. Negative for sleep disturbance. The patient is nervous/anxious.     Immunization History  Administered Date(s) Administered   Influenza, High Dose Seasonal PF 04/26/2016, 03/22/2017, 05/08/2019   Influenza,inj,Quad PF,6+ Mos 04/27/2018   Influenza-Unspecified 04/22/2011, 04/04/2012, 05/03/2013, 05/04/2013, 04/17/2014, 04/19/2015, 04/26/2016, 04/22/2017, 05/20/2021   Moderna SARS-COV2 Booster Vaccination 06/09/2020, 12/31/2020   Moderna Sars-Covid-2 Vaccination 07/30/2019, 08/27/2019   Pfizer Covid-19  Vaccine Bivalent Booster 22yr & up 04/15/2021   Pneumococcal Polysaccharide-23 08/26/2003, 09/06/2011, 05/15/2019   Pneumococcal-Unspecified 12/20/2013   Td 09/26/2006   Tdap 05/15/2019   Zoster Recombinat (Shingrix) 11/21/2017, 04/08/2018   Zoster, Live 10/25/2007   Pertinent  Health Maintenance Due  Topic Date Due   INFLUENZA VACCINE  02/23/2022   DEXA SCAN  Completed      03/31/2021    1:00 AM 03/31/2021    7:47 AM 03/31/2021   10:00 PM 04/04/2021    6:13 PM 09/18/2021   12:48 PM  Fall Risk  Falls in the past year?     1  Was there an injury with Fall?     1  Fall Risk Category Calculator     3  Fall Risk Category     High  Patient Fall Risk Level High fall risk High fall risk High fall risk High fall risk High fall risk  Patient at Risk for Falls Due to     History of fall(s);Impaired balance/gait;Impaired mobility;Orthopedic patient  Fall risk Follow up     Falls evaluation completed;Education provided;Falls prevention discussed   Functional Status Survey:    Vitals:   03/17/22 1135  BP: (!) 132/55  Pulse: 62  Resp: 19  Temp: (!) 95.4 F (35.2 C)  SpO2: 94%  Weight: 151 lb 11.2 oz (68.8 kg)  Height: '5\' 4"'  (1.626 m)   Body mass index is 26.04 kg/m. Physical Exam Vitals reviewed.  Constitutional:      General: She is not in acute distress. HENT:     Head: Normocephalic.     Right Ear: There is no impacted cerumen.     Left Ear: There is no impacted cerumen.     Nose: Rhinorrhea present.     Mouth/Throat:     Mouth: Mucous membranes are moist.  Eyes:     General:        Right eye: No discharge.        Left eye: No discharge.     Comments: Watery eyes  Cardiovascular:     Rate and Rhythm: Normal rate and regular rhythm.     Pulses: Normal pulses.     Heart sounds: Normal heart sounds.  Pulmonary:     Effort: Pulmonary effort is normal. No respiratory distress.     Breath sounds: Normal breath sounds. No wheezing or rales.  Abdominal:     General: Bowel  sounds are normal. There is no distension.     Palpations: Abdomen is soft.     Tenderness: There is no abdominal tenderness.  Musculoskeletal:     Cervical back: Neck supple.     Right lower leg: Edema present.     Left lower leg: Edema present.     Comments: Non pitting  Skin:    General: Skin is warm and dry.     Capillary Refill: Capillary refill takes less than 2 seconds.  Neurological:     General: No focal deficit present.     Mental Status: She is alert. Mental status is at baseline.     Motor: Weakness present.     Gait: Gait abnormal.     Comments: wheelchair  Psychiatric:        Mood and Affect: Mood normal.        Behavior: Behavior normal.     Comments: Very pleasant, follows commands, alert to self/person/place/situation     Labs reviewed: Recent Labs    03/28/21 0547 03/30/21 0503 04/04/21 1827 07/10/21 0000 09/24/21 0000 10/02/21 0000 10/08/21 0000  NA 137 137 145   < > 138 137 137  K 4.0 3.5 4.5   < > 4.3 4.0 4.4  CL 104 100 103   < > 103 102 99  CO2 '28 31 31   ' < > 25* 28* 36*  GLUCOSE 117* 101* 104*  --   --   --   --   BUN 22 36* 27*   < > 26* 19 16  CREATININE 1.00 1.31* 0.98   < > 1.2* 1.0 0.9  CALCIUM 9.2 8.9 9.7   < > 9.3 8.9 8.9   < > =  values in this interval not displayed.   Recent Labs    03/27/21 0543 04/04/21 1827 07/10/21 0000 09/24/21 0000 10/08/21 0000  AST 21 37 '22 18 16  ' ALT '16 24 19 10 7  ' ALKPHOS 69 95 91 86 81  BILITOT 0.4 0.5  --   --   --   PROT 7.4 7.9  --   --   --   ALBUMIN 3.7 3.2* 3.8 3.5 3.2*   Recent Labs    03/30/21 0503 03/31/21 0444 04/03/21 0000 04/04/21 1827 07/10/21 0000 09/24/21 0000 10/02/21 0000 10/08/21 0000 10/30/21 0000  WBC 10.7* 9.3   < > 10.3   < > 12.6 10.8 11.0 5.6  NEUTROABS  --   --    < > 8.0*   < > 10,206.00 9,050.00  --  4,256.00  HGB 7.6* 8.1*   < > 8.2*   < > 8.3* 7.8* 8.0* 8.4*  HCT 24.6* 26.1*   < > 27.8*   < > 28* 26* 26* 28*  MCV 83.4 82.9  --  85.3  --   --   --   --   --    PLT 83* 97*   < > 226   < > 21* 181 122* 104*   < > = values in this interval not displayed.   Lab Results  Component Value Date   TSH 3.49 09/17/2021   No results found for: "HGBA1C" Lab Results  Component Value Date   CHOL 125 10/30/2021   HDL 38 10/30/2021   LDLCALC 67 10/30/2021   TRIG 123 10/30/2021   CHOLHDL 3.8 11/23/2018    Significant Diagnostic Results in last 30 days:  No results found.  Assessment/Plan 1. Seasonal allergies - clear nasal congestion/watery eyes - start Zyrtec 10 mg po qhs x 1 month  2. Weight loss - lost 7 lbs in past month - she is limiting snacks - cont monthly weights  3. Essential hypertension - controlled - cont losartan  4. Mixed hyperlipidemia - cont statin  5. Diastolic dysfunction - no weight fluctuations, sob, non pitting edema - cont torsemide  6. Bilateral leg edema - cont torsemide - cont leg elevation  7. Memory impairment - recent BIMS 66 - appropriate today  8. Unstable gait - cont PT - cont skilled nursing  9. Chronic bilateral low back pain without sciatica - cont tylenol, gabapentin and oxycodone prn  10. Anemia of chronic disease - hgb stable  11. Acquired hypothyroidism - tsh stable - cont levothyroxine  12. Depression with anxiety - no mood changes  - cont Wellbutrin/Zoloft/Xanax prn    Family/ staff Communication: plan discussed with patient and nurse  Labs/tests ordered:  none

## 2022-03-22 DIAGNOSIS — R2681 Unsteadiness on feet: Secondary | ICD-10-CM | POA: Diagnosis not present

## 2022-03-22 DIAGNOSIS — Z9181 History of falling: Secondary | ICD-10-CM | POA: Diagnosis not present

## 2022-03-22 DIAGNOSIS — M545 Low back pain, unspecified: Secondary | ICD-10-CM | POA: Diagnosis not present

## 2022-03-22 DIAGNOSIS — M6281 Muscle weakness (generalized): Secondary | ICD-10-CM | POA: Diagnosis not present

## 2022-03-24 DIAGNOSIS — Z9181 History of falling: Secondary | ICD-10-CM | POA: Diagnosis not present

## 2022-03-24 DIAGNOSIS — M545 Low back pain, unspecified: Secondary | ICD-10-CM | POA: Diagnosis not present

## 2022-03-24 DIAGNOSIS — R2681 Unsteadiness on feet: Secondary | ICD-10-CM | POA: Diagnosis not present

## 2022-03-24 DIAGNOSIS — M6281 Muscle weakness (generalized): Secondary | ICD-10-CM | POA: Diagnosis not present

## 2022-03-24 DIAGNOSIS — R293 Abnormal posture: Secondary | ICD-10-CM | POA: Diagnosis not present

## 2022-03-26 ENCOUNTER — Ambulatory Visit: Payer: Self-pay | Admitting: Orthopedic Surgery

## 2022-03-26 ENCOUNTER — Encounter: Payer: Self-pay | Admitting: Orthopedic Surgery

## 2022-03-26 ENCOUNTER — Non-Acute Institutional Stay (SKILLED_NURSING_FACILITY): Payer: Medicare Other | Admitting: Orthopedic Surgery

## 2022-03-26 DIAGNOSIS — R2681 Unsteadiness on feet: Secondary | ICD-10-CM

## 2022-03-26 DIAGNOSIS — I10 Essential (primary) hypertension: Secondary | ICD-10-CM

## 2022-03-26 DIAGNOSIS — E782 Mixed hyperlipidemia: Secondary | ICD-10-CM

## 2022-03-26 DIAGNOSIS — R413 Other amnesia: Secondary | ICD-10-CM | POA: Diagnosis not present

## 2022-03-26 DIAGNOSIS — M546 Pain in thoracic spine: Secondary | ICD-10-CM

## 2022-03-26 DIAGNOSIS — W19XXXA Unspecified fall, initial encounter: Secondary | ICD-10-CM

## 2022-03-26 DIAGNOSIS — I5189 Other ill-defined heart diseases: Secondary | ICD-10-CM

## 2022-03-26 DIAGNOSIS — M545 Low back pain, unspecified: Secondary | ICD-10-CM | POA: Diagnosis not present

## 2022-03-26 DIAGNOSIS — R600 Localized edema: Secondary | ICD-10-CM | POA: Diagnosis not present

## 2022-03-26 DIAGNOSIS — M542 Cervicalgia: Secondary | ICD-10-CM | POA: Diagnosis not present

## 2022-03-26 NOTE — Progress Notes (Signed)
Location:   [facility] Room Number: 35-A Place of Service:  SNF [id number]) Provider:  Windell Moulding, NP  PCP: Virgie Dad, MD  Patient Care Team: Virgie Dad, MD as PCP - General (Internal Medicine) Mast, Man X, NP as Nurse Practitioner (Internal Medicine)  Extended Emergency Contact Information Primary Emergency Contact: Kistner,[name] Address: [address]          [place], [place] [address] [place] Phone: [phone] Mobile Phone: [phone] Relation: Son Secondary Emergency Contact: Leinberger, Kathi [place] Phone: [phone] Mobile Phone: [phone] Relation: Other  Code Status:  DNR Goals of care: Advanced Directive information    03/26/2022   11:56 AM  Advanced Directives  Does Patient Have a Medical Advance Directive? Yes  Type of Paramedic of [place];Out of facility DNR (pink MOST or yellow form)  Does patient want to make changes to medical advance directive? No - Patient declined  Copy of [place] in Chart? Yes - validated most recent copy scanned in chart (See row information)     Chief Complaint  Patient presents with   Acute Visit    Back pain.    HPI:  [name] [name] is a 88 y.o. female seen today for an acute visit for increased back pain.   This morning she was getting out of bed with staff and slipped. She landed on her back. Denies hitting her head. Answered all questions appropriately. Staff was able to assist her back to bed. She reported increased back pain after incident. She takes oxycodone 2.5 mg prn for chronic back pain. She also takes scheduled tylenol and gabapentin. Family notified, would like work up in SNF, refusing hospitalization at this time.   HTN- BUN/creat 24/10.3 02/22/2022, remains on losartan, NAS diet HLD- LDL 67 10/30/2021, remains on Lipitor Diastolic dysfunction- BNP 289 10/08/2021, Echo 03/18 noted normal LV systolic function, mild LV  hypertrophy noted, remains in torsemide BLE- non pitting, wears teds, elevates legs in bed, see above Impaired memory- BIMS score 13 03/09/2022, has periods of increased anxiety/confusion Unstable gait- ambulates with wheelchair, working with [name] [name] 2x/week, lives on skilled nursing unit, no recent falls   Past Medical History:  Diagnosis Date   Anemia    Anxiety    Chronotropic incompetence 12/2012    potentially medication related; noted on CPET    DDD (degenerative disc disease)    Eczema    GERD (gastroesophageal reflux disease)    H/O hiatal hernia    Hx: UTI (urinary tract infection)    Hyperlipidemia    Hypertension    Hypothyroidism    Osteopenia    Septicemia ([place]) 2002   following UTI   Vertigo, benign positional    Past Surgical History:  Procedure Laterality Date   BREAST SURGERY     left biopsy   CATARACT EXTRACTION Right    2 weeks ago   CPET / MET - PFTS     Consistent with chronotropic incompetence; See attached report in the results section   DILATION AND CURETTAGE OF UTERUS     x 2   DOPPLER ECHOCARDIOGRAPHY  01/10/2013   Normal LV size and function. Normal EF. Air sclerosis but no stenosis   ESOPHAGOGASTRODUODENOSCOPY  05/04/2012   Procedure: ESOPHAGOGASTRODUODENOSCOPY (EGD);  Surgeon: Inda Castle, MD;  Location: [facility];  Service: Endoscopy;  Laterality: N/A;   EYE SURGERY     cataract extraction with ILO  ? eye  IR KYPHO EA ADDL LEVEL THORACIC OR LUMBAR  10/28/2016   IR KYPHO LUMBAR INC FX REDUCE BONE BX UNI/BIL CANNULATION INC/IMAGING  10/28/2016   IR KYPHO THORACIC WITH BONE BIOPSY  12/24/2016   IR RADIOLOGIST EVAL & MGMT  11/11/2016   KNEE ARTHROSCOPY  2011   right   ORIF ANKLE FRACTURE Right 03/27/2021   Procedure: OPEN REDUCTION INTERNAL FIXATION (ORIF) ANKLE FRACTURE;  Surgeon: Marchia Bond, MD;  Location: [facility];  Service: Orthopedics;  Laterality: Right;   TONSILLECTOMY     TOTAL KNEE ARTHROPLASTY  04/24/2012   Procedure: TOTAL KNEE  ARTHROPLASTY;  Surgeon: Gearlean Alf, MD;  Location: [facility];  Service: Orthopedics;  Laterality: Right;   TRANSTHORACIC ECHOCARDIOGRAM  02/2018   Ordered by PCP for "congestive heart failure " -EF 60 M 65%.  GR 1 DD.  No R WMA--- > ESSENTIALLY NORMAL    No Known Allergies  Allergies as of 03/26/2022   No Known Allergies      Medication List        Accurate as of March 26, 2022 11:56 AM. If you have any questions, ask your nurse or doctor.          acetaminophen 500 MG tablet Commonly known as: TYLENOL Take 1,000 mg by mouth every 8 (eight) hours. For pain   Albuterol Sulfate 2.5 MG/0.5ML Nebu Inhale into the lungs every 6 (six) hours as needed (As needed for wheezing or shortness of breath).   ALPRAZolam 0.25 MG tablet Commonly known as: XANAX Take 1 tablet (0.25 mg total) by mouth 2 (two) times daily as needed for anxiety. Don't give unless very anxious   ARTIFICIAL TEARS OP Place 1 drop into both eyes in the morning, at noon, and at bedtime.   atorvastatin 10 MG tablet Commonly known as: LIPITOR Take 5 mg by mouth at bedtime.   Biofreeze 4 % Gel Generic drug: Menthol (Topical Analgesic) Apply topically 2 (two) times daily as needed. Apply thin layer to left thigh   buPROPion 150 MG 12 hr tablet Commonly known as: WELLBUTRIN SR Take 1 tablet (150 mg total) by mouth 2 (two) times daily after a meal.   calcium citrate-vitamin D 315-200 MG-UNIT tablet Commonly known as: CITRACAL+D Take 1 tablet by mouth 2 (two) times daily.   Camphor-Menthol-Methyl Sal 3.07-31-08 % Ptch Place 1 patch onto the skin See admin instructions. Apply one patch onto the skin of the  lower back daily for back pain   cetirizine 10 MG tablet Commonly known as: ZYRTEC Take 10 mg by mouth daily.   diclofenac Sodium 1 % Gel Commonly known as: VOLTAREN Apply 4 g topically in the morning and at bedtime. Apply to bilateral ankles BID   ferrous sulfate 325 (65 FE) MG EC tablet Take 325  mg by mouth every Monday, Wednesday, and Friday. With a meal   fish oil-omega-3 fatty acids 1000 MG capsule Take 1 g by mouth every morning.   gabapentin 100 MG capsule Commonly known as: NEURONTIN Take 100 mg by mouth 2 (two) times daily.   gabapentin 100 MG capsule Commonly known as: NEURONTIN Take 100 mg by mouth daily.   hydrocortisone cream 1 % Apply 1 application topically daily as needed for itching.   levothyroxine 75 MCG tablet Commonly known as: SYNTHROID Take 75 mcg by mouth every morning.   losartan 25 MG tablet Commonly known as: COZAAR Take 25 mg by mouth every morning.   nystatin powder Commonly known as: MYCOSTATIN/NYSTOP Apply 1  application topically 2 (two) times daily as needed (yeast).   nystatin cream Commonly known as: MYCOSTATIN Apply 1 Application topically 3 (three) times daily as needed.   omeprazole 40 MG capsule Commonly known as: PRILOSEC Take 40 mg by mouth every morning.   OxyCODONE HCl (Abuse Deter) 5 MG Taba Commonly known as: OXAYDO Take 2.5 mg by mouth 2 (two) times daily as needed. Give 30 minutes before transportation to ortho appointments.   polyethylene glycol 17 g packet Commonly known as: MIRALAX / GLYCOLAX Take 17 g by mouth every other day.   potassium chloride SA 20 MEQ tablet Commonly known as: KLOR-CON M Take 20 mEq by mouth as directed. Monday, Tuesday, Wednesday, Thursday, and Friday.   senna 8.6 MG Tabs tablet Commonly known as: SENOKOT Take 2 tablets by mouth at bedtime.   sertraline 25 MG tablet Commonly known as: ZOLOFT Take 75 mg by mouth every morning.   torsemide 20 MG tablet Commonly known as: DEMADEX Take 20 mg by mouth as directed. Once A Day on Mon, Tue, Wed, Thu, Fri   zinc oxide 20 % ointment Apply 1 application topically See admin instructions. Apply topically to per/buttocks after every incontinent episode and as needed for redness        Review of Systems  Constitutional:  Negative for  activity change, appetite change, chills, fatigue and fever.  HENT:  Negative for congestion and trouble swallowing.   Eyes:  Negative for visual disturbance.  Respiratory:  Negative for cough, shortness of breath and wheezing.   Cardiovascular:  Positive for leg swelling. Negative for chest pain.  Gastrointestinal:  Positive for constipation. Negative for abdominal distention, abdominal pain, diarrhea, nausea and vomiting.  Genitourinary:  Negative for dysuria, frequency and hematuria.  Musculoskeletal:  Positive for arthralgias, back pain and gait problem.  Skin:  Negative for wound.  Neurological:  Positive for weakness. Negative for dizziness and headaches.  Psychiatric/Behavioral:  Positive for confusion and dysphoric mood. Negative for sleep disturbance. The patient is nervous/anxious.     Immunization History  Administered Date(s) Administered   Influenza, High Dose Seasonal PF 04/26/2016, 03/22/2017, 05/08/2019   Influenza,inj,Quad PF,6+ Mos 04/27/2018   Influenza-Unspecified 04/22/2011, 04/04/2012, 05/03/2013, 05/04/2013, 04/17/2014, 04/19/2015, 04/26/2016, 04/22/2017, 05/20/2021   Moderna SARS-COV2 Booster Vaccination 06/09/2020, 12/31/2020   Moderna Sars-Covid-2 Vaccination 07/30/2019, 08/27/2019   Pfizer Covid-19 Vaccine Bivalent Booster 1yr & up 04/15/2021   Pneumococcal Polysaccharide-23 08/26/2003, 09/06/2011, 05/15/2019   Pneumococcal-Unspecified 12/20/2013   Td 09/26/2006   Tdap 05/15/2019   Zoster Recombinat (Shingrix) 11/21/2017, 04/08/2018   Zoster, Live 10/25/2007   Pertinent  Health Maintenance Due  Topic Date Due   INFLUENZA VACCINE  02/23/2022   DEXA SCAN  Completed      03/31/2021    1:00 AM 03/31/2021    7:47 AM 03/31/2021   10:00 PM 04/04/2021    6:13 PM 09/18/2021   12:48 PM  Fall Risk  Falls in the past year?     1  Was there an injury with Fall?     1  Fall Risk Category Calculator     3  Fall Risk Category     High  Patient Fall Risk Level High  fall risk High fall risk High fall risk High fall risk High fall risk  Patient at Risk for Falls Due to     History of fall(s);Impaired balance/gait;Impaired mobility;Orthopedic patient  Fall risk Follow up     Falls evaluation completed;Education provided;Falls prevention discussed   Functional Status Survey:  Vitals:   03/26/22 1136  BP: 130/70  Pulse: 74  Resp: 16  Temp: (!) 96.4 F (35.8 C)  SpO2: 92%  Weight: 151 lb 11.2 oz (68.8 kg)  Height: '5\' 4"'  (1.626 m)   Body mass index is 26.04 kg/m. Physical Exam  Labs reviewed: Recent Labs    03/28/21 0547 03/30/21 0503 04/04/21 1827 07/10/21 0000 09/24/21 0000 10/02/21 0000 10/08/21 0000  NA 137 137 145   < > 138 137 137  K 4.0 3.5 4.5   < > 4.3 4.0 4.4  CL 104 100 103   < > 103 102 99  CO2 '28 31 31   ' < > 25* 28* 36*  GLUCOSE 117* 101* 104*  --   --   --   --   BUN 22 36* 27*   < > 26* 19 16  CREATININE 1.00 1.31* 0.98   < > 1.2* 1.0 0.9  CALCIUM 9.2 8.9 9.7   < > 9.3 8.9 8.9   < > = values in this interval not displayed.   Recent Labs    03/27/21 0543 04/04/21 1827 07/10/21 0000 09/24/21 0000 10/08/21 0000  AST 21 37 '22 18 16  ' ALT '16 24 19 10 7  ' ALKPHOS 69 95 91 86 81  BILITOT 0.4 0.5  --   --   --   PROT 7.4 7.9  --   --   --   ALBUMIN 3.7 3.2* 3.8 3.5 3.2*   Recent Labs    03/30/21 0503 03/31/21 0444 04/03/21 0000 04/04/21 1827 07/10/21 0000 09/24/21 0000 10/02/21 0000 10/08/21 0000 10/30/21 0000  WBC 10.7* 9.3   < > 10.3   < > 12.6 10.8 11.0 5.6  NEUTROABS  --   --    < > 8.0*   < > 10,206.00 9,050.00  --  4,256.00  HGB 7.6* 8.1*   < > 8.2*   < > 8.3* 7.8* 8.0* 8.4*  HCT 24.6* 26.1*   < > 27.8*   < > 28* 26* 26* 28*  MCV 83.4 82.9  --  85.3  --   --   --   --   --   PLT 83* 97*   < > 226   < > 21* 181 122* 104*   < > = values in this interval not displayed.   Lab Results  Component Value Date   TSH 3.49 09/17/2021   No results found for: "HGBA1C" Lab Results  Component Value Date    CHOL 125 10/30/2021   HDL 38 10/30/2021   LDLCALC 67 10/30/2021   TRIG 123 10/30/2021   CHOLHDL 3.8 11/23/2018    Significant Diagnostic Results in last 30 days:  No results found.  Assessment/Plan 1. Acute midline thoracic back pain - mechanical fall 09/01, increased upper back pain after, no injury to head - stat x-ray spine - cont scheduled tylenol, oxycodone prn and gabapentin - family would like to hold off on hospitalization  2. Fall, initial encounter - see above  3. Essential hypertension - controlled losartan  4. Mixed hyperlipidemia - cont Lipitor  5. Diastolic dysfunction - compensated - cont torsemide  6. Bilateral leg edema - see above - cont leg elevation  7. Memory impairment - no behaviors - cont skilled nursing care  8. Unstable gait - working with [name] [name] - cont skilled nursing    Family/ staff Communication: plan discussed with patient and nurse  Labs/tests ordered: xray spine

## 2022-03-29 DIAGNOSIS — Z9181 History of falling: Secondary | ICD-10-CM | POA: Diagnosis not present

## 2022-03-29 DIAGNOSIS — M6281 Muscle weakness (generalized): Secondary | ICD-10-CM | POA: Diagnosis not present

## 2022-03-29 DIAGNOSIS — R293 Abnormal posture: Secondary | ICD-10-CM | POA: Diagnosis not present

## 2022-03-30 ENCOUNTER — Non-Acute Institutional Stay (SKILLED_NURSING_FACILITY): Payer: Medicare Other | Admitting: Orthopedic Surgery

## 2022-03-30 ENCOUNTER — Encounter: Payer: Self-pay | Admitting: Orthopedic Surgery

## 2022-03-30 DIAGNOSIS — R600 Localized edema: Secondary | ICD-10-CM | POA: Diagnosis not present

## 2022-03-30 DIAGNOSIS — M545 Low back pain, unspecified: Secondary | ICD-10-CM | POA: Diagnosis not present

## 2022-03-30 DIAGNOSIS — I10 Essential (primary) hypertension: Secondary | ICD-10-CM | POA: Diagnosis not present

## 2022-03-30 DIAGNOSIS — E782 Mixed hyperlipidemia: Secondary | ICD-10-CM

## 2022-03-30 DIAGNOSIS — M546 Pain in thoracic spine: Secondary | ICD-10-CM | POA: Diagnosis not present

## 2022-03-30 DIAGNOSIS — I5189 Other ill-defined heart diseases: Secondary | ICD-10-CM

## 2022-03-30 DIAGNOSIS — S22080A Wedge compression fracture of T11-T12 vertebra, initial encounter for closed fracture: Secondary | ICD-10-CM | POA: Diagnosis not present

## 2022-03-30 DIAGNOSIS — M6281 Muscle weakness (generalized): Secondary | ICD-10-CM | POA: Diagnosis not present

## 2022-03-30 DIAGNOSIS — R2681 Unsteadiness on feet: Secondary | ICD-10-CM

## 2022-03-30 DIAGNOSIS — R413 Other amnesia: Secondary | ICD-10-CM | POA: Diagnosis not present

## 2022-03-30 DIAGNOSIS — Z9181 History of falling: Secondary | ICD-10-CM | POA: Diagnosis not present

## 2022-03-30 NOTE — Progress Notes (Signed)
Location:  [facility] Number: 35/A Place of Service:  SNF [phone]) Provider:  Yvonna Alanis, NP   Virgie Dad, MD  Patient Care Team: Virgie Dad, MD as PCP - General (Internal Medicine) Mast, Man X, NP as Nurse Practitioner (Internal Medicine)  Extended Emergency Contact Information Primary Emergency Contact: Raven,[name] Address: [address]          [place], [place] [address] [place] Phone: [phone] Mobile Phone: [phone] Relation: Son Secondary Emergency Contact: Hallmon, Kathi [place] Phone: [phone] Mobile Phone: [phone] Relation: Other  Code Status:  DNR Goals of care: Advanced Directive information    03/26/2022   11:56 AM  Advanced Directives  Does Patient Have a Medical Advance Directive? Yes  Type of [facility];Out of facility DNR (pink MOST or yellow form)  Does patient want to make changes to medical advance directive? No - Patient declined  Copy of [place] in Chart? Yes - validated most recent copy scanned in chart (See row information)     Chief Complaint  Patient presents with   Acute Visit    Back pain     HPI:  Pt is a 88 y.o. female seen today for acute visit due to back pain.   09/01 she had a mechanical fall and landed on her back. No injury to head. Treatment options discussed with family, refused ED evaluation. 09/01 x ray cervical/thoracic/lumbosacral spine negative for acute abnormality, compression fracture of indeterminate age to L1, L2, T11 and T12 noted. 04/04/2021 CT lumbar spine noted chronic compression fractures to T11, L1 and L2. Today, she denies back pain. She is taking scheduled tylenol, lidocaine patch and voltaren gel for pain. She has also received a few doses of oxycodone 2.5 mg for breakthrough pain. She started working with PT yesterday. She is able to stand and ambulate short distances with  assist.   HTN- BUN/creat 24/10.3 02/22/2022, remains on losartan, NAS diet HLD- LDL 67 10/30/2021, remains on Lipitor Diastolic dysfunction- BNP 289 10/08/2021, Echo 03/18 noted normal LV systolic function, mild LV hypertrophy noted, remains in torsemide BLE- non pitting, wears teds, elevates legs in bed, see above Impaired memory- BIMS score 13 03/09/2022, has periods of increased anxiety/confusion  Past Medical History:  Diagnosis Date   Anemia    Anxiety    Chronotropic incompetence 12/2012    potentially medication related; noted on CPET    DDD (degenerative disc disease)    Eczema    GERD (gastroesophageal reflux disease)    H/O hiatal hernia    Hx: UTI (urinary tract infection)    Hyperlipidemia    Hypertension    Hypothyroidism    Osteopenia    Septicemia ([place]) 2002   following UTI   Vertigo, benign positional    Past Surgical History:  Procedure Laterality Date   BREAST SURGERY     left biopsy   CATARACT EXTRACTION Right    2 weeks ago   CPET / MET - PFTS     Consistent with chronotropic incompetence; See attached report in the results section   DILATION AND CURETTAGE OF UTERUS     x 2   DOPPLER ECHOCARDIOGRAPHY  01/10/2013   Normal LV size and function. Normal EF. Air sclerosis but no stenosis   ESOPHAGOGASTRODUODENOSCOPY  05/04/2012   Procedure: ESOPHAGOGASTRODUODENOSCOPY (EGD);  Surgeon: Inda Castle, MD;  Location: [facility];  Service: Endoscopy;  Laterality:  N/A;   EYE SURGERY     cataract extraction with ILO  ? eye   IR KYPHO EA ADDL LEVEL THORACIC OR LUMBAR  10/28/2016   IR KYPHO LUMBAR INC FX REDUCE BONE BX UNI/BIL CANNULATION INC/IMAGING  10/28/2016   IR KYPHO THORACIC WITH BONE BIOPSY  12/24/2016   IR RADIOLOGIST EVAL & MGMT  11/11/2016   KNEE ARTHROSCOPY  2011   right   ORIF ANKLE FRACTURE Right 03/27/2021   Procedure: OPEN REDUCTION INTERNAL FIXATION (ORIF) ANKLE FRACTURE;  Surgeon: Marchia Bond, MD;  Location: [facility];  Service: Orthopedics;   Laterality: Right;   TONSILLECTOMY     TOTAL KNEE ARTHROPLASTY  04/24/2012   Procedure: TOTAL KNEE ARTHROPLASTY;  Surgeon: Gearlean Alf, MD;  Location: [facility];  Service: Orthopedics;  Laterality: Right;   TRANSTHORACIC ECHOCARDIOGRAM  02/2018   Ordered by PCP for "congestive heart failure " -EF 60 M 65%.  GR 1 DD.  No R WMA--- > ESSENTIALLY NORMAL    No Known Allergies  Outpatient Encounter Medications as of 03/30/2022  Medication Sig   acetaminophen (TYLENOL) 500 MG tablet Take 1,000 mg by mouth every 8 (eight) hours. For pain   Albuterol Sulfate 2.5 MG/0.5ML NEBU Inhale into the lungs every 6 (six) hours as needed (As needed for wheezing or shortness of breath).   ALPRAZolam (XANAX) 0.25 MG tablet Take 1 tablet (0.25 mg total) by mouth 2 (two) times daily as needed for anxiety. Don't give unless very anxious   atorvastatin (LIPITOR) 10 MG tablet Take 5 mg by mouth at bedtime.   buPROPion (WELLBUTRIN SR) 150 MG 12 hr tablet Take 1 tablet (150 mg total) by mouth 2 (two) times daily after a meal.   calcium citrate-vitamin D (CITRACAL+D) 315-200 MG-UNIT tablet Take 1 tablet by mouth 2 (two) times daily.   Camphor-Menthol-Methyl Sal 3.07-31-08 % PTCH Place 1 patch onto the skin See admin instructions. Apply one patch onto the skin of the  lower back daily for back pain   Carboxymethylcellulose Sodium (ARTIFICIAL TEARS OP) Place 1 drop into both eyes in the morning, at noon, and at bedtime.   cetirizine (ZYRTEC) 10 MG tablet Take 10 mg by mouth daily.   diclofenac Sodium (VOLTAREN) 1 % GEL Apply 4 g topically in the morning and at bedtime. Apply to bilateral ankles BID   ferrous sulfate 325 (65 FE) MG EC tablet Take 325 mg by mouth every Monday, Wednesday, and Friday. With a meal   fish oil-omega-3 fatty acids 1000 MG capsule Take 1 g by mouth every morning.   gabapentin (NEURONTIN) 100 MG capsule Take 100 mg by mouth 2 (two) times daily.   gabapentin (NEURONTIN) 100 MG capsule Take 100 mg by  mouth daily.   hydrocortisone cream 1 % Apply 1 application topically daily as needed for itching.   levothyroxine (SYNTHROID) 75 MCG tablet Take 75 mcg by mouth every morning.   losartan (COZAAR) 25 MG tablet Take 25 mg by mouth every morning.   Menthol, Topical Analgesic, (BIOFREEZE) 4 % GEL Apply topically 2 (two) times daily as needed. Apply thin layer to left thigh   nystatin (MYCOSTATIN/NYSTOP) powder Apply 1 application topically 2 (two) times daily as needed (yeast).   nystatin cream (MYCOSTATIN) Apply 1 Application topically 3 (three) times daily as needed.   omeprazole (PRILOSEC) 40 MG capsule Take 40 mg by mouth every morning.   OxyCODONE HCl, Abuse Deter, (OXAYDO) 5 MG TABA Take 2.5 mg by mouth 2 (two) times daily  as needed. Give 30 minutes before transportation to ortho appointments.   polyethylene glycol (MIRALAX / GLYCOLAX) 17 g packet Take 17 g by mouth every other day.   potassium chloride SA (KLOR-CON M) 20 MEQ tablet Take 20 mEq by mouth as directed. Monday, Tuesday, Wednesday, Thursday, and Friday.   senna (SENOKOT) 8.6 MG TABS tablet Take 2 tablets by mouth at bedtime.   sertraline (ZOLOFT) 25 MG tablet Take 75 mg by mouth every morning.   torsemide (DEMADEX) 20 MG tablet Take 20 mg by mouth as directed. Once A Day on Mon, Tue, Wed, Thu, Fri   zinc oxide 20 % ointment Apply 1 application topically See admin instructions. Apply topically to per/buttocks after every incontinent episode and as needed for redness   No facility-administered encounter medications on file as of 03/30/2022.    Review of Systems  Constitutional:  Negative for activity change, appetite change, chills, fatigue and fever.  HENT:  Negative for congestion and trouble swallowing.   Eyes:  Negative for visual disturbance.  Respiratory:  Negative for cough, shortness of breath and wheezing.   Cardiovascular:  Positive for leg swelling. Negative for chest pain.  Gastrointestinal:  Positive for constipation.  Negative for abdominal distention, abdominal pain, diarrhea, nausea and vomiting.  Genitourinary:  Negative for dysuria and frequency.  Musculoskeletal:  Positive for arthralgias, back pain and gait problem.  Skin:  Negative for wound.  Neurological:  Positive for weakness. Negative for dizziness, numbness and headaches.  Psychiatric/Behavioral:  Positive for confusion and dysphoric mood. Negative for sleep disturbance. The patient is nervous/anxious.     Immunization History  Administered Date(s) Administered   Influenza, High Dose Seasonal PF 04/26/2016, 03/22/2017, 05/08/2019   Influenza,inj,Quad PF,6+ Mos 04/27/2018   Influenza-Unspecified 04/22/2011, 04/04/2012, 05/03/2013, 05/04/2013, 04/17/2014, 04/19/2015, 04/26/2016, 04/22/2017, 05/20/2021   Moderna SARS-COV2 Booster Vaccination 06/09/2020, 12/31/2020   Moderna Sars-Covid-2 Vaccination 07/30/2019, 08/27/2019   Pfizer Covid-19 Vaccine Bivalent Booster 68yr & up 04/15/2021   Pneumococcal Polysaccharide-23 08/26/2003, 09/06/2011, 05/15/2019   Pneumococcal-Unspecified 12/20/2013   Td 09/26/2006   Tdap 05/15/2019   Zoster Recombinat (Shingrix) 11/21/2017, 04/08/2018   Zoster, Live 10/25/2007   Pertinent  Health Maintenance Due  Topic Date Due   INFLUENZA VACCINE  02/23/2022   DEXA SCAN  Completed      03/31/2021    1:00 AM 03/31/2021    7:47 AM 03/31/2021   10:00 PM 04/04/2021    6:13 PM 09/18/2021   12:48 PM  Fall Risk  Falls in the past year?     1  Was there an injury with Fall?     1  Fall Risk Category Calculator     3  Fall Risk Category     High  Patient Fall Risk Level High fall risk High fall risk High fall risk High fall risk High fall risk  Patient at Risk for Falls Due to     History of fall(s);Impaired balance/gait;Impaired mobility;Orthopedic patient  Fall risk Follow up     Falls evaluation completed;Education provided;Falls prevention discussed   Functional Status Survey:    Vitals:   03/30/22 1144  BP:  (!) 152/59  Pulse: 66  Resp: 16  Temp: (!) 96.6 F (35.9 C)  SpO2: 93%  Weight: 156 lb 11.2 oz (71.1 kg)  Height: '5\' 4"'  (1.626 m)   Body mass index is 26.9 kg/m. Physical Exam Vitals reviewed.  Constitutional:      General: She is not in acute distress. HENT:     Head:  Normocephalic.  Eyes:     General:        Right eye: No discharge.        Left eye: No discharge.  Cardiovascular:     Rate and Rhythm: Normal rate and regular rhythm.     Pulses: Normal pulses.     Heart sounds: Normal heart sounds.  Pulmonary:     Effort: Pulmonary effort is normal. No respiratory distress.     Breath sounds: Normal breath sounds. No wheezing.  Abdominal:     General: Bowel sounds are normal. There is no distension.     Palpations: Abdomen is soft.     Tenderness: There is no abdominal tenderness.  Musculoskeletal:     Cervical back: Normal and neck supple.     Thoracic back: Tenderness present. No swelling or deformity. Normal range of motion.     Lumbar back: Tenderness present. No swelling or deformity. Normal range of motion.     Right lower leg: Edema present.     Left lower leg: Edema present.     Comments: Non pitting  Skin:    General: Skin is warm and dry.     Capillary Refill: Capillary refill takes less than 2 seconds.  Neurological:     General: No focal deficit present.     Mental Status: She is alert. Mental status is at baseline.     Motor: Weakness present.     Gait: Gait abnormal.     Comments: wheelchair  Psychiatric:        Mood and Affect: Mood normal.        Behavior: Behavior normal.     Comments: Very pleasant, follows commands, alert to self/familiar face     Labs reviewed: Recent Labs    04/04/21 1827 07/10/21 0000 09/24/21 0000 10/02/21 0000 10/08/21 0000  NA 145   < > 138 137 137  K 4.5   < > 4.3 4.0 4.4  CL 103   < > 103 102 99  CO2 31   < > 25* 28* 36*  GLUCOSE 104*  --   --   --   --   BUN 27*   < > 26* 19 16  CREATININE 0.98   < >  1.2* 1.0 0.9  CALCIUM 9.7   < > 9.3 8.9 8.9   < > = values in this interval not displayed.   Recent Labs    04/04/21 1827 07/10/21 0000 09/24/21 0000 10/08/21 0000  AST 37 '22 18 16  ' ALT '24 19 10 7  ' ALKPHOS 95 91 86 81  BILITOT 0.5  --   --   --   PROT 7.9  --   --   --   ALBUMIN 3.2* 3.8 3.5 3.2*   Recent Labs    03/31/21 0444 04/03/21 0000 04/04/21 1827 07/10/21 0000 09/24/21 0000 10/02/21 0000 10/08/21 0000 10/30/21 0000  WBC 9.3   < > 10.3   < > 12.6 10.8 11.0 5.6  NEUTROABS  --    < > 8.0*   < > 10,206.00 9,050.00  --  4,256.00  HGB 8.1*   < > 8.2*   < > 8.3* 7.8* 8.0* 8.4*  HCT 26.1*   < > 27.8*   < > 28* 26* 26* 28*  MCV 82.9  --  85.3  --   --   --   --   --   PLT 97*   < > 226   < > 21*  181 122* 104*   < > = values in this interval not displayed.   Lab Results  Component Value Date   TSH 3.49 09/17/2021   No results found for: "HGBA1C" Lab Results  Component Value Date   CHOL 125 10/30/2021   HDL 38 10/30/2021   LDLCALC 67 10/30/2021   TRIG 123 10/30/2021   CHOLHDL 3.8 11/23/2018    Significant Diagnostic Results in last 30 days:  No results found.  Assessment/Plan 1. Compression fracture of T12 vertebra, initial encounter ([place]) - 09/01 mechanical fall, increased back pain after event - 09/01 x ray cervical/thoracic/lumbosacral spine negative for acute abnormality, compression fracture of indeterminate age to L1, L2, T11 and T12 noted - 04/04/2021 CT lumbar spine noted chronic compression fractures to T11, L1 and L2  - ? T12 fracture  - cont scheduled tylenol, lidocaine and voltaren gel - cont oxycodone prn for breakthrough pain - cont PT  2. Acute midline thoracic back pain - see above  3. Unstable gait - see above  4. Essential hypertension - controlled with losartan  5. Mixed hyperlipidemia - cont Lipitor  6. Diastolic dysfunction - compensated - cont torsemide  7. Bilateral leg edema - non pitting - cont torsemide  8.  Memory impairment - no behaviors - cont skilled nursing    Family/ staff Communication: plan discussed with patient and nurse  Labs/tests ordered:  none

## 2022-03-31 DIAGNOSIS — M6281 Muscle weakness (generalized): Secondary | ICD-10-CM | POA: Diagnosis not present

## 2022-03-31 DIAGNOSIS — Z9181 History of falling: Secondary | ICD-10-CM | POA: Diagnosis not present

## 2022-03-31 DIAGNOSIS — R293 Abnormal posture: Secondary | ICD-10-CM | POA: Diagnosis not present

## 2022-04-01 DIAGNOSIS — M545 Low back pain, unspecified: Secondary | ICD-10-CM | POA: Diagnosis not present

## 2022-04-01 DIAGNOSIS — R2681 Unsteadiness on feet: Secondary | ICD-10-CM | POA: Diagnosis not present

## 2022-04-01 DIAGNOSIS — M6281 Muscle weakness (generalized): Secondary | ICD-10-CM | POA: Diagnosis not present

## 2022-04-01 DIAGNOSIS — Z9181 History of falling: Secondary | ICD-10-CM | POA: Diagnosis not present

## 2022-04-02 DIAGNOSIS — M6281 Muscle weakness (generalized): Secondary | ICD-10-CM | POA: Diagnosis not present

## 2022-04-02 DIAGNOSIS — R293 Abnormal posture: Secondary | ICD-10-CM | POA: Diagnosis not present

## 2022-04-02 DIAGNOSIS — Z9181 History of falling: Secondary | ICD-10-CM | POA: Diagnosis not present

## 2022-04-05 DIAGNOSIS — M6281 Muscle weakness (generalized): Secondary | ICD-10-CM | POA: Diagnosis not present

## 2022-04-05 DIAGNOSIS — Z9181 History of falling: Secondary | ICD-10-CM | POA: Diagnosis not present

## 2022-04-05 DIAGNOSIS — R2681 Unsteadiness on feet: Secondary | ICD-10-CM | POA: Diagnosis not present

## 2022-04-05 DIAGNOSIS — M545 Low back pain, unspecified: Secondary | ICD-10-CM | POA: Diagnosis not present

## 2022-04-06 ENCOUNTER — Non-Acute Institutional Stay (SKILLED_NURSING_FACILITY): Payer: Medicare Other | Admitting: Orthopedic Surgery

## 2022-04-06 ENCOUNTER — Encounter: Payer: Self-pay | Admitting: Orthopedic Surgery

## 2022-04-06 DIAGNOSIS — E782 Mixed hyperlipidemia: Secondary | ICD-10-CM | POA: Diagnosis not present

## 2022-04-06 DIAGNOSIS — R2681 Unsteadiness on feet: Secondary | ICD-10-CM | POA: Diagnosis not present

## 2022-04-06 DIAGNOSIS — R051 Acute cough: Secondary | ICD-10-CM

## 2022-04-06 DIAGNOSIS — I10 Essential (primary) hypertension: Secondary | ICD-10-CM | POA: Diagnosis not present

## 2022-04-06 DIAGNOSIS — R413 Other amnesia: Secondary | ICD-10-CM | POA: Diagnosis not present

## 2022-04-06 DIAGNOSIS — I5189 Other ill-defined heart diseases: Secondary | ICD-10-CM | POA: Diagnosis not present

## 2022-04-06 DIAGNOSIS — Z9181 History of falling: Secondary | ICD-10-CM | POA: Diagnosis not present

## 2022-04-06 DIAGNOSIS — R600 Localized edema: Secondary | ICD-10-CM | POA: Diagnosis not present

## 2022-04-06 DIAGNOSIS — S22080A Wedge compression fracture of T11-T12 vertebra, initial encounter for closed fracture: Secondary | ICD-10-CM

## 2022-04-06 DIAGNOSIS — M6281 Muscle weakness (generalized): Secondary | ICD-10-CM | POA: Diagnosis not present

## 2022-04-06 DIAGNOSIS — M545 Low back pain, unspecified: Secondary | ICD-10-CM | POA: Diagnosis not present

## 2022-04-06 NOTE — Progress Notes (Signed)
Location:   [facility] Room Number: 35-A Place of Service:  SNF [phone]) Provider:  Windell Moulding, NP  PCP: Virgie Dad, MD  Patient Care Team: Virgie Dad, MD as PCP - General (Internal Medicine) Mast, Man X, NP as Nurse Practitioner (Internal Medicine)  Extended Emergency Contact Information Primary Emergency Contact: Serafin,[name] Address: [address]          [place], [place] [address] [place] Phone: [phone] Mobile Phone: [phone] Relation: Son Secondary Emergency Contact: Ginty, Kathi [place] Phone: [phone] Mobile Phone: [phone] Relation: Other  Code Status:  DNR Goals of care: Advanced Directive information    04/06/2022   11:19 AM  Advanced Directives  Does Patient Have a Medical Advance Directive? Yes  Type of Paramedic of [place];Out of facility DNR (pink MOST or yellow form)  Does patient want to make changes to medical advance directive? No - Patient declined  Copy of [place] in Chart? Yes - validated most recent copy scanned in chart (See row information)     Chief Complaint  Patient presents with   Acute Visit    Cough    HPI:  Pt is a 88 y.o. female seen today for an acute visit for acute cough.   Nursing reports O2 sat 80% on room air. She also reports acute non productive cough. Rapid covid test negative. She denies chest pain, sob or cold symptoms. O2 sat rebounded to 95% after deep breathing and coughing. Afebrile. Vitals stable.   09/01 she had a mechanical fall and landed on her back. 09/01 x ray cervical/thoracic/lumbosacral spine negative for acute abnormality, compression fracture of indeterminate age to L1, L2, T11 and T12 noted. 04/04/2021 CT lumbar spine noted chronic compression fractures to T11, L1 and L2. She reports improved pain. She is able to participate in PT. Pain tolerated with tylenol and oxycodone prn.   HTN-  BUN/creat 24/10.3 02/22/2022, remains on losartan, NAS diet HLD- LDL 67 10/30/2021, remains on Lipitor Diastolic dysfunction- BNP 289 10/08/2021, Echo 03/18 noted normal LV systolic function, mild LV hypertrophy noted, remains in torsemide BLE- non pitting, wears teds, elevates legs in bed, see above Impaired memory- BIMS score 13 03/09/2022, has periods of increased anxiety/confusion  Past Medical History:  Diagnosis Date   Anemia    Anxiety    Chronotropic incompetence 12/2012    potentially medication related; noted on CPET    DDD (degenerative disc disease)    Eczema    GERD (gastroesophageal reflux disease)    H/O hiatal hernia    Hx: UTI (urinary tract infection)    Hyperlipidemia    Hypertension    Hypothyroidism    Osteopenia    Septicemia ([place]) 2002   following UTI   Vertigo, benign positional    Past Surgical History:  Procedure Laterality Date   BREAST SURGERY     left biopsy   CATARACT EXTRACTION Right    2 weeks ago   CPET / MET - PFTS     Consistent with chronotropic incompetence; See attached report in the results section   DILATION AND CURETTAGE OF UTERUS     x 2   DOPPLER ECHOCARDIOGRAPHY  01/10/2013   Normal LV size and function. Normal EF. Air sclerosis but no stenosis   ESOPHAGOGASTRODUODENOSCOPY  05/04/2012   Procedure: ESOPHAGOGASTRODUODENOSCOPY (EGD);  Surgeon: Inda Castle, MD;  Location: [facility];  Service: Endoscopy;  Laterality: N/A;  EYE SURGERY     cataract extraction with ILO  ? eye   IR KYPHO EA ADDL LEVEL THORACIC OR LUMBAR  10/28/2016   IR KYPHO LUMBAR INC FX REDUCE BONE BX UNI/BIL CANNULATION INC/IMAGING  10/28/2016   IR KYPHO THORACIC WITH BONE BIOPSY  12/24/2016   IR RADIOLOGIST EVAL & MGMT  11/11/2016   KNEE ARTHROSCOPY  2011   right   ORIF ANKLE FRACTURE Right 03/27/2021   Procedure: OPEN REDUCTION INTERNAL FIXATION (ORIF) ANKLE FRACTURE;  Surgeon: Marchia Bond, MD;  Location: [facility];  Service: Orthopedics;  Laterality: Right;    TONSILLECTOMY     TOTAL KNEE ARTHROPLASTY  04/24/2012   Procedure: TOTAL KNEE ARTHROPLASTY;  Surgeon: Gearlean Alf, MD;  Location: [facility];  Service: Orthopedics;  Laterality: Right;   TRANSTHORACIC ECHOCARDIOGRAM  02/2018   Ordered by PCP for "congestive heart failure " -EF 60 M 65%.  GR 1 DD.  No R WMA--- > ESSENTIALLY NORMAL    No Known Allergies  Allergies as of 04/06/2022   No Known Allergies      Medication List        Accurate as of April 06, 2022 11:19 AM. If you have any questions, ask your nurse or doctor.          acetaminophen 500 MG tablet Commonly known as: TYLENOL Take 1,000 mg by mouth every 8 (eight) hours. For pain   Albuterol Sulfate 2.5 MG/0.5ML Nebu Inhale into the lungs every 6 (six) hours as needed (As needed for wheezing or shortness of breath).   ALPRAZolam 0.25 MG tablet Commonly known as: XANAX Take 1 tablet (0.25 mg total) by mouth 2 (two) times daily as needed for anxiety. Don't give unless very anxious   ARTIFICIAL TEARS OP Place 1 drop into both eyes in the morning, at noon, and at bedtime.   atorvastatin 10 MG tablet Commonly known as: LIPITOR Take 5 mg by mouth at bedtime.   Biofreeze 4 % Gel Generic drug: Menthol (Topical Analgesic) Apply topically 2 (two) times daily as needed. Apply thin layer to left thigh   buPROPion 150 MG 12 hr tablet Commonly known as: WELLBUTRIN SR Take 1 tablet (150 mg total) by mouth 2 (two) times daily after a meal.   calcium citrate-vitamin D 315-200 MG-UNIT tablet Commonly known as: CITRACAL+D Take 1 tablet by mouth 2 (two) times daily.   Camphor-Menthol-Methyl Sal 3.07-31-08 % Ptch Place 1 patch onto the skin See admin instructions. Apply one patch onto the skin of the  lower back daily for back pain   cetirizine 10 MG tablet Commonly known as: ZYRTEC Take 10 mg by mouth daily.   diclofenac Sodium 1 % Gel Commonly known as: VOLTAREN Apply 4 g topically in the morning and at bedtime. Apply  to bilateral ankles BID   ferrous sulfate 325 (65 FE) MG EC tablet Take 325 mg by mouth every Monday, Wednesday, and Friday. With a meal   fish oil-omega-3 fatty acids 1000 MG capsule Take 1 g by mouth every morning.   gabapentin 100 MG capsule Commonly known as: NEURONTIN Take 100 mg by mouth 2 (two) times daily.   gabapentin 100 MG capsule Commonly known as: NEURONTIN Take 100 mg by mouth daily.   hydrocortisone cream 1 % Apply 1 application topically daily as needed for itching.   levothyroxine 75 MCG tablet Commonly known as: SYNTHROID Take 75 mcg by mouth every morning.   losartan 25 MG tablet Commonly known as: COZAAR Take 25  mg by mouth every morning.   nystatin powder Commonly known as: MYCOSTATIN/NYSTOP Apply 1 application topically 2 (two) times daily as needed (yeast).   nystatin cream Commonly known as: MYCOSTATIN Apply 1 Application topically 3 (three) times daily as needed.   omeprazole 40 MG capsule Commonly known as: PRILOSEC Take 40 mg by mouth every morning.   OxyCODONE HCl (Abuse Deter) 5 MG Taba Commonly known as: OXAYDO Take 2.5 mg by mouth 2 (two) times daily as needed. Give 30 minutes before transportation to ortho appointments.   polyethylene glycol 17 g packet Commonly known as: MIRALAX / GLYCOLAX Take 17 g by mouth every other day.   potassium chloride SA 20 MEQ tablet Commonly known as: KLOR-CON M Take 20 mEq by mouth as directed. Monday, Tuesday, Wednesday, Thursday, and Friday.   senna 8.6 MG Tabs tablet Commonly known as: SENOKOT Take 2 tablets by mouth at bedtime.   sertraline 25 MG tablet Commonly known as: ZOLOFT Take 75 mg by mouth every morning.   torsemide 20 MG tablet Commonly known as: DEMADEX Take 20 mg by mouth as directed. Once A Day on Mon, Tue, Wed, Thu, Fri   zinc oxide 20 % ointment Apply 1 application topically See admin instructions. Apply topically to per/buttocks after every incontinent episode and as  needed for redness        Review of Systems  Constitutional:  Negative for activity change, appetite change, chills, fatigue and fever.  HENT:  Negative for congestion and sore throat.   Eyes:  Negative for visual disturbance.  Respiratory:  Positive for cough. Negative for shortness of breath and wheezing.   Cardiovascular:  Positive for leg swelling. Negative for chest pain.  Gastrointestinal:  Negative for abdominal distention, abdominal pain, constipation, diarrhea, nausea and vomiting.  Genitourinary:  Negative for dysuria, frequency and hematuria.  Musculoskeletal:  Positive for arthralgias, back pain and gait problem.  Skin:  Negative for wound.  Neurological:  Positive for weakness. Negative for dizziness and headaches.  Psychiatric/Behavioral:  Positive for confusion and dysphoric mood. Negative for sleep disturbance. The patient is nervous/anxious.     Immunization History  Administered Date(s) Administered   Influenza, High Dose Seasonal PF 04/26/2016, 03/22/2017, 05/08/2019   Influenza,inj,Quad PF,6+ Mos 04/27/2018   Influenza-Unspecified 04/22/2011, 04/04/2012, 05/03/2013, 05/04/2013, 04/17/2014, 04/19/2015, 04/26/2016, 04/22/2017, 05/20/2021   Moderna SARS-COV2 Booster Vaccination 06/09/2020, 12/31/2020   Moderna Sars-Covid-2 Vaccination 07/30/2019, 08/27/2019, 01/06/2022   Pfizer Covid-19 Vaccine Bivalent Booster 62yr & up 04/15/2021   Pneumococcal Polysaccharide-23 08/26/2003, 09/06/2011, 05/15/2019   Pneumococcal-Unspecified 12/20/2013   Td 09/26/2006   Tdap 05/15/2019   Zoster Recombinat (Shingrix) 11/21/2017, 04/08/2018   Zoster, Live 10/25/2007   Pertinent  Health Maintenance Due  Topic Date Due   INFLUENZA VACCINE  02/23/2022   DEXA SCAN  Completed      03/31/2021    1:00 AM 03/31/2021    7:47 AM 03/31/2021   10:00 PM 04/04/2021    6:13 PM 09/18/2021   12:48 PM  Fall Risk  Falls in the past year?     1  Was there an injury with Fall?     1  Fall Risk  Category Calculator     3  Fall Risk Category     High  Patient Fall Risk Level _0   Patient at Risk for Falls Due to     History of fall(s);Impaired balance/gait;Impaired mobility;Orthopedic patient  Fall risk Follow  up     Falls evaluation completed;Education provided;Falls prevention discussed   Functional Status Survey:    Vitals:   04/06/22 1112  BP: (!) 130/59  Pulse: 63  Resp: 17  Temp: (!) 96.4 F (35.8 C)  SpO2: 95%  Weight: 156 lb 11.2 oz (71.1 kg)  Height: _0  (1.626 m)   Body mass index is 26.9 kg/m. Physical Exam Vitals reviewed.  Constitutional:      General: She is not in acute distress. HENT:     Head: Normocephalic.     Nose: Nose normal.     Mouth/Throat:     Mouth: Mucous membranes are dry.     Pharynx: No posterior oropharyngeal erythema.  Eyes:     General:        Right eye: No discharge.        Left eye: No discharge.  Cardiovascular:     Rate and Rhythm: Normal rate and regular rhythm.     Pulses: Normal pulses.     Heart sounds: Normal heart sounds.  Pulmonary:     Effort: Pulmonary effort is normal. No respiratory distress.     Breath sounds: Normal breath sounds. No wheezing or rales.  Abdominal:     General: Bowel sounds are normal. There is no distension.     Palpations: Abdomen is soft.     Tenderness: There is no abdominal tenderness.  Musculoskeletal:     Cervical back: Normal and neck supple.     Thoracic back: Tenderness present. No swelling or deformity. Decreased range of motion.     Lumbar back: Tenderness present. No swelling or deformity. Decreased range of motion.     Right lower leg: Edema present.     Left lower leg: Edema present.     Comments: Non pitting  Lymphadenopathy:     Cervical: No cervical adenopathy.  Skin:    General: Skin is warm and dry.     Capillary Refill: Capillary refill takes less than 2 seconds.  Neurological:     General:  No focal deficit present.     Mental Status: She is alert. Mental status is at baseline.     Motor: Weakness present.     Gait: Gait abnormal.  Psychiatric:        Mood and Affect: Mood normal.        Behavior: Behavior normal.     Comments: Very pleasant, follows commands, alert to self/person/place     Labs reviewed: Recent Labs    09/24/21 0000 10/02/21 0000 10/08/21 0000  NA 138 137 137  K 4.3 4.0 4.4  CL 103 102 99  CO2 25* 28* 36*  BUN 26* 19 16  CREATININE 1.2* 1.0 0.9  CALCIUM 9.3 8.9 8.9   Recent Labs    07/10/21 0000 09/24/21 0000 10/08/21 0000  AST _1 ALT _2 ALKPHOS 91 86 81  ALBUMIN 3.8 3.5 3.2*   Recent Labs    09/24/21 0000 10/02/21 0000 10/08/21 0000 10/30/21 0000  WBC 12.6 10.8 11.0 5.6  NEUTROABS 10,206.00 9,050.00  --  4,256.00  HGB 8.3* 7.8* 8.0* 8.4*  HCT 28* 26* 26* 28*  PLT 21* 181 122* 104*   Lab Results  Component Value Date   TSH 3.49 09/17/2021   No results found for: "HGBA1C" Lab Results  Component Value Date   CHOL 125 10/30/2021   HDL 38 10/30/2021   LDLCALC 67 10/30/2021   TRIG 123 10/30/2021   CHOLHDL  3.8 11/23/2018    Significant Diagnostic Results in last 30 days:  No results found.  Assessment/Plan 1. Acute cough - O2 sat 80%, rebounded to 95% once more awake - rapid covid- negative - suspect cough from waking up, dry mouth - lung sounds clear  2. Compression fracture of T12 vertebra, initial encounter ([place]) - 09/01 mechanical fall - 09/01 xray cervical/thoracic/lumbosacral spine negative for acute abnormality, compression fracture of indeterminate age to L1, L2, T11 and T12 noted - 04/04/2021 CT lumbar spine noted chronic compression fractures to T11, L1 and L2  - pain improved - participating in PT - cont tylenol and oxycodone prn  3. Unstable gait - see above - cont PT  4. Essential hypertension - controlled with losartan  5. Mixed hyperlipidemia - cont Lipitor  6. Diastolic  dysfunction - compensated - cont torsemide  7. Bilateral leg edema - see above  8. Memory impairment - no behaviors - cont skilled nursing    Family/ staff Communication: plan discussed with patient and nurse  Labs/tests ordered: rapid covid test

## 2022-04-07 DIAGNOSIS — R293 Abnormal posture: Secondary | ICD-10-CM | POA: Diagnosis not present

## 2022-04-07 DIAGNOSIS — Z9181 History of falling: Secondary | ICD-10-CM | POA: Diagnosis not present

## 2022-04-07 DIAGNOSIS — M6281 Muscle weakness (generalized): Secondary | ICD-10-CM | POA: Diagnosis not present

## 2022-04-08 DIAGNOSIS — M545 Low back pain, unspecified: Secondary | ICD-10-CM | POA: Diagnosis not present

## 2022-04-08 DIAGNOSIS — M6281 Muscle weakness (generalized): Secondary | ICD-10-CM | POA: Diagnosis not present

## 2022-04-08 DIAGNOSIS — R2681 Unsteadiness on feet: Secondary | ICD-10-CM | POA: Diagnosis not present

## 2022-04-08 DIAGNOSIS — Z9181 History of falling: Secondary | ICD-10-CM | POA: Diagnosis not present

## 2022-04-09 DIAGNOSIS — R293 Abnormal posture: Secondary | ICD-10-CM | POA: Diagnosis not present

## 2022-04-09 DIAGNOSIS — M6281 Muscle weakness (generalized): Secondary | ICD-10-CM | POA: Diagnosis not present

## 2022-04-09 DIAGNOSIS — Z9181 History of falling: Secondary | ICD-10-CM | POA: Diagnosis not present

## 2022-04-12 DIAGNOSIS — M6281 Muscle weakness (generalized): Secondary | ICD-10-CM | POA: Diagnosis not present

## 2022-04-12 DIAGNOSIS — M545 Low back pain, unspecified: Secondary | ICD-10-CM | POA: Diagnosis not present

## 2022-04-12 DIAGNOSIS — R2681 Unsteadiness on feet: Secondary | ICD-10-CM | POA: Diagnosis not present

## 2022-04-12 DIAGNOSIS — R293 Abnormal posture: Secondary | ICD-10-CM | POA: Diagnosis not present

## 2022-04-12 DIAGNOSIS — Z9181 History of falling: Secondary | ICD-10-CM | POA: Diagnosis not present

## 2022-04-13 DIAGNOSIS — R2681 Unsteadiness on feet: Secondary | ICD-10-CM | POA: Diagnosis not present

## 2022-04-13 DIAGNOSIS — M545 Low back pain, unspecified: Secondary | ICD-10-CM | POA: Diagnosis not present

## 2022-04-13 DIAGNOSIS — Z9181 History of falling: Secondary | ICD-10-CM | POA: Diagnosis not present

## 2022-04-13 DIAGNOSIS — M6281 Muscle weakness (generalized): Secondary | ICD-10-CM | POA: Diagnosis not present

## 2022-04-14 ENCOUNTER — Encounter: Payer: Self-pay | Admitting: Orthopedic Surgery

## 2022-04-14 ENCOUNTER — Non-Acute Institutional Stay (SKILLED_NURSING_FACILITY): Payer: Medicare Other | Admitting: Orthopedic Surgery

## 2022-04-14 DIAGNOSIS — E782 Mixed hyperlipidemia: Secondary | ICD-10-CM | POA: Diagnosis not present

## 2022-04-14 DIAGNOSIS — R413 Other amnesia: Secondary | ICD-10-CM | POA: Diagnosis not present

## 2022-04-14 DIAGNOSIS — R0602 Shortness of breath: Secondary | ICD-10-CM | POA: Diagnosis not present

## 2022-04-14 DIAGNOSIS — K219 Gastro-esophageal reflux disease without esophagitis: Secondary | ICD-10-CM

## 2022-04-14 DIAGNOSIS — S22080A Wedge compression fracture of T11-T12 vertebra, initial encounter for closed fracture: Secondary | ICD-10-CM | POA: Diagnosis not present

## 2022-04-14 DIAGNOSIS — I10 Essential (primary) hypertension: Secondary | ICD-10-CM

## 2022-04-14 DIAGNOSIS — Z9181 History of falling: Secondary | ICD-10-CM | POA: Diagnosis not present

## 2022-04-14 DIAGNOSIS — M6281 Muscle weakness (generalized): Secondary | ICD-10-CM | POA: Diagnosis not present

## 2022-04-14 DIAGNOSIS — R600 Localized edema: Secondary | ICD-10-CM

## 2022-04-14 DIAGNOSIS — E039 Hypothyroidism, unspecified: Secondary | ICD-10-CM | POA: Diagnosis not present

## 2022-04-14 DIAGNOSIS — I5189 Other ill-defined heart diseases: Secondary | ICD-10-CM

## 2022-04-14 DIAGNOSIS — D638 Anemia in other chronic diseases classified elsewhere: Secondary | ICD-10-CM | POA: Diagnosis not present

## 2022-04-14 DIAGNOSIS — R062 Wheezing: Secondary | ICD-10-CM | POA: Diagnosis not present

## 2022-04-14 DIAGNOSIS — R293 Abnormal posture: Secondary | ICD-10-CM | POA: Diagnosis not present

## 2022-04-14 DIAGNOSIS — F418 Other specified anxiety disorders: Secondary | ICD-10-CM

## 2022-04-14 MED ORDER — IPRATROPIUM-ALBUTEROL 0.5-2.5 (3) MG/3ML IN SOLN: 3.0000 mL | Freq: Two times a day (BID) | RESPIRATORY_TRACT | 0 refills | Status: DC | PRN

## 2022-04-14 NOTE — Progress Notes (Signed)
Location:  [facility] Room Number: 35/A Place of Service:  SNF (31) Provider:   E , NP   Gupta, Anjali L, MD  Patient Care Team: Gupta, Anjali L, MD as PCP - General (Internal Medicine) Mast, Man X, NP as Nurse Practitioner (Internal Medicine)  Extended Emergency Contact Information Primary Emergency Contact: Crumby,[name] Address: [address]          [place], [place] [address] [place] Home Phone: [phone] Mobile Phone: [phone] Relation: Son Secondary Emergency Contact: Mckelvin, Dino  [place] Home Phone: [phone] Mobile Phone: [phone] Relation: Other  Code Status:  DNR Goals of care: Advanced Directive information    04/06/2022   11:19 AM  Advanced Directives  Does Patient Have a Medical Advance Directive? Yes  Type of Advance Directive Healthcare Power of Attorney;Out of facility DNR (pink MOST or yellow form)  Does patient want to make changes to medical advance directive? No - Patient declined  Copy of Healthcare Power of Attorney in Chart? Yes - validated most recent copy scanned in chart (See row information)     Chief Complaint  Patient presents with   Medical Management of Chronic Issues    HPI:  Pt is a 88 y.o. female seen today for medical management of chronic diseases.    SOB- desats x 1 week (80-84% recorded), intermittent oxygen use, covid test negative, also has non productive cough, sats 96% on 2LNC, afebrile, on torsemide due to diastolic dysfunction Compression fracture- 09/01 mechanical fall, x ray cervical/thoracic/lumbosacral spine negative for acute abnormality, compression fracture of indeterminate age to L1, L2, T11 and T12 noted, 04/04/2021 CT lumbar spine noted chronic compression fractures to T11, L1 and L2, pain controlled with oxycodone prn,doing well with PT/OT HTN- BUN/creat 24/10.3 02/22/2022, remains on losartan, NAS diet HLD- LDL 67 10/30/2021, remains on  Lipitor Diastolic dysfunction- BNP 289 10/08/2021, Echo 03/18 noted normal LV systolic function, mild LV hypertrophy noted, remains in torsemide BLE- non pitting, wears teds, elevates legs in bed, see above Impaired memory- BIMS score 13 03/09/2022, has periods of increased anxiety/confusion Anemia/pancytopenia - Hgb 9.3 (was 8.4), Hct 29.4 (was 26), platelets 96 (was 104), no further workup, remains on ferrous sulfate Hypothyroidism- TSH 3.49 09/17/2021, remains on levothyroxine GERD- hgb 9.3 02/22/2022, remains on Prilosec Depression with anxiety- no mood changes, remains on Wellbutrin, Zoloft and xanax prn  Recent blood pressures:  09/19- 121/62  09/12- 123/61  09/05- 130/59  Recent weights:  09/04- 156.7 lbs  08/02- 151.7 lbs  07/01- 158.7 lbs    Past Medical History:  Diagnosis Date   Anemia    Anxiety    Chronotropic incompetence 12/2012    potentially medication related; noted on CPET    DDD (degenerative disc disease)    Eczema    GERD (gastroesophageal reflux disease)    H/O hiatal hernia    Hx: UTI (urinary tract infection)    Hyperlipidemia    Hypertension    Hypothyroidism    Osteopenia    Septicemia (HCC) 2002   following UTI   Vertigo, benign positional    Past Surgical History:  Procedure Laterality Date   BREAST SURGERY     left biopsy   CATARACT EXTRACTION Right    2 weeks ago   CPET / MET - PFTS     Consistent with chronotropic incompetence; See attached report in the results section   DILATION AND CURETTAGE OF UTERUS     x 2     DOPPLER ECHOCARDIOGRAPHY  01/10/2013   Normal LV size and function. Normal EF. Air sclerosis but no stenosis   ESOPHAGOGASTRODUODENOSCOPY  05/04/2012   Procedure: ESOPHAGOGASTRODUODENOSCOPY (EGD);  Surgeon: Robert D Kaplan, MD;  Location: [facility];  Service: Endoscopy;  Laterality: N/A;   EYE SURGERY     cataract extraction with ILO  ? eye   IR KYPHO EA ADDL LEVEL THORACIC OR LUMBAR  10/28/2016   IR KYPHO LUMBAR INC  FX REDUCE BONE BX UNI/BIL CANNULATION INC/IMAGING  10/28/2016   IR KYPHO THORACIC WITH BONE BIOPSY  12/24/2016   IR RADIOLOGIST EVAL & MGMT  11/11/2016   KNEE ARTHROSCOPY  2011   right   ORIF ANKLE FRACTURE Right 03/27/2021   Procedure: OPEN REDUCTION INTERNAL FIXATION (ORIF) ANKLE FRACTURE;  Surgeon: Landau, Joshua, MD;  Location: [facility];  Service: Orthopedics;  Laterality: Right;   TONSILLECTOMY     TOTAL KNEE ARTHROPLASTY  04/24/2012   Procedure: TOTAL KNEE ARTHROPLASTY;  Surgeon: Frank V Aluisio, MD;  Location: [facility];  Service: Orthopedics;  Laterality: Right;   TRANSTHORACIC ECHOCARDIOGRAM  02/2018   Ordered by PCP for "congestive heart failure " -EF 60 M 65%.  GR 1 DD.  No R WMA--- > ESSENTIALLY NORMAL    No Known Allergies  Outpatient Encounter Medications as of 04/14/2022  Medication Sig   acetaminophen (TYLENOL) 500 MG tablet Take 1,000 mg by mouth every 8 (eight) hours. For pain   Albuterol Sulfate 2.5 MG/0.5ML NEBU Inhale into the lungs every 6 (six) hours as needed (As needed for wheezing or shortness of breath).   ALPRAZolam (XANAX) 0.25 MG tablet Take 1 tablet (0.25 mg total) by mouth 2 (two) times daily as needed for anxiety. Don't give unless very anxious   atorvastatin (LIPITOR) 10 MG tablet Take 5 mg by mouth at bedtime.   buPROPion (WELLBUTRIN SR) 150 MG 12 hr tablet Take 1 tablet (150 mg total) by mouth 2 (two) times daily after a meal.   calcium citrate-vitamin D (CITRACAL+D) 315-200 MG-UNIT tablet Take 1 tablet by mouth 2 (two) times daily.   Camphor-Menthol-Methyl Sal 3.07-31-08 % PTCH Place 1 patch onto the skin See admin instructions. Apply one patch onto the skin of the  lower back daily for back pain   Carboxymethylcellulose Sodium (ARTIFICIAL TEARS OP) Place 1 drop into both eyes in the morning, at noon, and at bedtime.   cetirizine (ZYRTEC) 10 MG tablet Take 10 mg by mouth daily.   diclofenac Sodium (VOLTAREN) 1 % GEL Apply 4 g topically in the morning and at bedtime.  Apply to bilateral ankles BID   ferrous sulfate 325 (65 FE) MG EC tablet Take 325 mg by mouth every Monday, Wednesday, and Friday. With a meal   fish oil-omega-3 fatty acids 1000 MG capsule Take 1 g by mouth every morning.   gabapentin (NEURONTIN) 100 MG capsule Take 100 mg by mouth 2 (two) times daily.   gabapentin (NEURONTIN) 100 MG capsule Take 100 mg by mouth daily.   hydrocortisone cream 1 % Apply 1 application topically daily as needed for itching.   levothyroxine (SYNTHROID) 75 MCG tablet Take 75 mcg by mouth every morning.   losartan (COZAAR) 25 MG tablet Take 25 mg by mouth every morning.   Menthol, Topical Analgesic, (BIOFREEZE) 4 % GEL Apply topically 2 (two) times daily as needed. Apply thin layer to left thigh   nystatin (MYCOSTATIN/NYSTOP) powder Apply 1 application topically 2 (two) times daily as needed (yeast).   nystatin cream (MYCOSTATIN)   Apply 1 Application topically 3 (three) times daily as needed.   omeprazole (PRILOSEC) 40 MG capsule Take 40 mg by mouth every morning.   OxyCODONE HCl, Abuse Deter, (OXAYDO) 5 MG TABA Take 2.5 mg by mouth 2 (two) times daily as needed. Give 30 minutes before transportation to ortho appointments.   polyethylene glycol (MIRALAX / GLYCOLAX) 17 g packet Take 17 g by mouth every other day.   potassium chloride SA (KLOR-CON M) 20 MEQ tablet Take 20 mEq by mouth as directed. Monday, Tuesday, Wednesday, Thursday, and Friday.   senna (SENOKOT) 8.6 MG TABS tablet Take 2 tablets by mouth at bedtime.   sertraline (ZOLOFT) 25 MG tablet Take 75 mg by mouth every morning.   torsemide (DEMADEX) 20 MG tablet Take 20 mg by mouth as directed. Once A Day on Mon, Tue, Wed, Thu, Fri   zinc oxide 20 % ointment Apply 1 application topically See admin instructions. Apply topically to per/buttocks after every incontinent episode and as needed for redness   No facility-administered encounter medications on file as of 04/14/2022.    Review of Systems  Constitutional:   Negative for activity change, appetite change, chills, fatigue and fever.  HENT:  Negative for congestion and sore throat.   Eyes:  Negative for visual disturbance.  Respiratory:  Positive for cough, shortness of breath and wheezing.   Cardiovascular:  Negative for chest pain and leg swelling.  Gastrointestinal:  Negative for abdominal distention, abdominal pain, constipation, diarrhea, nausea and vomiting.  Genitourinary:  Negative for dysuria, frequency and hematuria.  Musculoskeletal:  Positive for arthralgias, back pain and gait problem.  Skin:  Negative for wound.  Neurological:  Positive for weakness. Negative for dizziness and headaches.  Psychiatric/Behavioral:  Positive for confusion and dysphoric mood. Negative for sleep disturbance. The patient is nervous/anxious.     Immunization History  Administered Date(s) Administered   Influenza, High Dose Seasonal PF 04/26/2016, 03/22/2017, 05/08/2019   Influenza,inj,Quad PF,6+ Mos 04/27/2018   Influenza-Unspecified 04/22/2011, 04/04/2012, 05/03/2013, 05/04/2013, 04/17/2014, 04/19/2015, 04/26/2016, 04/22/2017, 05/20/2021   Moderna SARS-COV2 Booster Vaccination 06/09/2020, 12/31/2020   Moderna Sars-Covid-2 Vaccination 07/30/2019, 08/27/2019, 01/06/2022   Pfizer Covid-19 Vaccine Bivalent Booster 87yr & up 04/15/2021   Pneumococcal Polysaccharide-23 08/26/2003, 09/06/2011, 05/15/2019   Pneumococcal-Unspecified 12/20/2013   Td 09/26/2006   Tdap 05/15/2019   Zoster Recombinat (Shingrix) 11/21/2017, 04/08/2018   Zoster, Live 10/25/2007   Pertinent  Health Maintenance Due  Topic Date Due   INFLUENZA VACCINE  02/23/2022   DEXA SCAN  Completed      03/31/2021    1:00 AM 03/31/2021    7:47 AM 03/31/2021   10:00 PM 04/04/2021    6:13 PM 09/18/2021   12:48 PM  Fall Risk  Falls in the past year?     1  Was there an injury with Fall?     1  Fall Risk Category Calculator     3  Fall Risk Category     High  Patient Fall Risk Level High fall  risk High fall risk High fall risk High fall risk High fall risk  Patient at Risk for Falls Due to     History of fall(s);Impaired balance/gait;Impaired mobility;Orthopedic patient  Fall risk Follow up     Falls evaluation completed;Education provided;Falls prevention discussed   Functional Status Survey:    Vitals:   04/14/22 1339  BP: 121/62  Pulse: 62  Resp: 12  Temp: (!) 97.5 F (36.4 C)  SpO2: 96%  Weight: 156 lb 11.2 oz (  71.1 kg)  Height: 5' 4" (1.626 m)   Body mass index is 26.9 kg/m. Physical Exam Vitals reviewed.  Constitutional:      General: She is not in acute distress. HENT:     Head: Normocephalic.     Right Ear: There is no impacted cerumen.     Left Ear: There is no impacted cerumen.     Nose: Nose normal.     Mouth/Throat:     Mouth: Mucous membranes are moist.  Eyes:     General:        Right eye: No discharge.        Left eye: No discharge.  Cardiovascular:     Rate and Rhythm: Normal rate and regular rhythm.     Pulses: Normal pulses.     Heart sounds: Normal heart sounds.  Pulmonary:     Effort: Pulmonary effort is normal.     Breath sounds: Examination of the right-upper field reveals wheezing. Examination of the left-upper field reveals wheezing. Examination of the right-middle field reveals rales. Examination of the left-middle field reveals rales. Wheezing and rales present.  Abdominal:     General: Bowel sounds are normal. There is no distension.     Palpations: Abdomen is soft.     Tenderness: There is no abdominal tenderness.  Musculoskeletal:     Cervical back: Neck supple.     Right lower leg: No edema.     Left lower leg: No edema.  Skin:    General: Skin is warm and dry.     Capillary Refill: Capillary refill takes less than 2 seconds.  Neurological:     General: No focal deficit present.     Mental Status: She is alert and oriented to person, place, and time.     Motor: Weakness present.     Gait: Gait abnormal.     Comments:  wheelchair  Psychiatric:        Mood and Affect: Mood normal.        Behavior: Behavior normal.     Labs reviewed: Recent Labs    09/24/21 0000 10/02/21 0000 10/08/21 0000  NA 138 137 137  K 4.3 4.0 4.4  CL 103 102 99  CO2 25* 28* 36*  BUN 26* 19 16  CREATININE 1.2* 1.0 0.9  CALCIUM 9.3 8.9 8.9   Recent Labs    07/10/21 0000 09/24/21 0000 10/08/21 0000  AST _0 ALT _1 ALKPHOS 91 86 81  ALBUMIN 3.8 3.5 3.2*   Recent Labs    09/24/21 0000 10/02/21 0000 10/08/21 0000 10/30/21 0000  WBC 12.6 10.8 11.0 5.6  NEUTROABS 10,206.00 9,050.00  --  4,256.00  HGB 8.3* 7.8* 8.0* 8.4*  HCT 28* 26* 26* 28*  PLT 21* 181 122* 104*   Lab Results  Component Value Date   TSH 3.49 09/17/2021   No results found for: "HGBA1C" Lab Results  Component Value Date   CHOL 125 10/30/2021   HDL 38 10/30/2021   LDLCALC 67 10/30/2021   TRIG 123 10/30/2021   CHOLHDL 3.8 11/23/2018    Significant Diagnostic Results in last 30 days:  No results found.  Assessment/Plan 1. Shortness of breath - wearing oxygen more- desats to 80's in past week - wheezing/rales noted on exam - covid negative - start Duonebs BID prn x 1 month - discontinue albuterol nebs - CXR  2. Compression fracture of T12 vertebra, initial encounter ([place]) - 09/01 mechanical fall - suspect  T12 fracture - 09/01 xray cervical/thoracic/lumbosacral spine negative for acute abnormality, compression fracture of indeterminate age to L1, L2, T11 and T12 noted - 04/04/2021 CT lumbar spine noted chronic compression fractures to T11, L1 and L2  - pain improved - participating in PT - cont tylenol and oxycodone prn  3. Essential hypertension - controlled - cont losartan  4. Mixed hyperlipidemia - LDL stable - cont Lipitor  5. Diastolic dysfunction - see above - cont torsemide  6. Bilateral leg edema - no edema noted  7. Memory impairment - no behaviors - cont skilled nursing  8. Anemia of  chronic disease - no further workup - cont ferrous sulfate  9. Acquired hypothyroidism - TSH stable - cont levothyroxine  10. Gastroesophageal reflux disease, unspecified whether esophagitis present - hgb stable - cont omeprazole  11. Depression with anxiety - no mood changes - cont zoloft, xanax and Wellbutrin    Family/ staff Communication: plan discussed with patient and nurse  Labs/tests ordered:  CXR

## 2022-04-15 DIAGNOSIS — R2681 Unsteadiness on feet: Secondary | ICD-10-CM | POA: Diagnosis not present

## 2022-04-15 DIAGNOSIS — M545 Low back pain, unspecified: Secondary | ICD-10-CM | POA: Diagnosis not present

## 2022-04-15 DIAGNOSIS — M6281 Muscle weakness (generalized): Secondary | ICD-10-CM | POA: Diagnosis not present

## 2022-04-15 DIAGNOSIS — Z9181 History of falling: Secondary | ICD-10-CM | POA: Diagnosis not present

## 2022-04-19 DIAGNOSIS — M6281 Muscle weakness (generalized): Secondary | ICD-10-CM | POA: Diagnosis not present

## 2022-04-19 DIAGNOSIS — R293 Abnormal posture: Secondary | ICD-10-CM | POA: Diagnosis not present

## 2022-04-19 DIAGNOSIS — Z9181 History of falling: Secondary | ICD-10-CM | POA: Diagnosis not present

## 2022-04-20 DIAGNOSIS — R2681 Unsteadiness on feet: Secondary | ICD-10-CM | POA: Diagnosis not present

## 2022-04-20 DIAGNOSIS — M6281 Muscle weakness (generalized): Secondary | ICD-10-CM | POA: Diagnosis not present

## 2022-04-20 DIAGNOSIS — Z9181 History of falling: Secondary | ICD-10-CM | POA: Diagnosis not present

## 2022-04-20 DIAGNOSIS — M545 Low back pain, unspecified: Secondary | ICD-10-CM | POA: Diagnosis not present

## 2022-04-22 DIAGNOSIS — R293 Abnormal posture: Secondary | ICD-10-CM | POA: Diagnosis not present

## 2022-04-22 DIAGNOSIS — Z9181 History of falling: Secondary | ICD-10-CM | POA: Diagnosis not present

## 2022-04-22 DIAGNOSIS — R2681 Unsteadiness on feet: Secondary | ICD-10-CM | POA: Diagnosis not present

## 2022-04-22 DIAGNOSIS — M545 Low back pain, unspecified: Secondary | ICD-10-CM | POA: Diagnosis not present

## 2022-04-22 DIAGNOSIS — M6281 Muscle weakness (generalized): Secondary | ICD-10-CM | POA: Diagnosis not present

## 2022-04-26 DIAGNOSIS — R293 Abnormal posture: Secondary | ICD-10-CM | POA: Diagnosis not present

## 2022-04-26 DIAGNOSIS — M6281 Muscle weakness (generalized): Secondary | ICD-10-CM | POA: Diagnosis not present

## 2022-04-26 DIAGNOSIS — Z9181 History of falling: Secondary | ICD-10-CM | POA: Diagnosis not present

## 2022-04-27 DIAGNOSIS — M545 Low back pain, unspecified: Secondary | ICD-10-CM | POA: Diagnosis not present

## 2022-04-27 DIAGNOSIS — M6281 Muscle weakness (generalized): Secondary | ICD-10-CM | POA: Diagnosis not present

## 2022-04-27 DIAGNOSIS — Z9181 History of falling: Secondary | ICD-10-CM | POA: Diagnosis not present

## 2022-04-27 DIAGNOSIS — R2681 Unsteadiness on feet: Secondary | ICD-10-CM | POA: Diagnosis not present

## 2022-04-29 DIAGNOSIS — M6281 Muscle weakness (generalized): Secondary | ICD-10-CM | POA: Diagnosis not present

## 2022-04-29 DIAGNOSIS — Z9181 History of falling: Secondary | ICD-10-CM | POA: Diagnosis not present

## 2022-04-29 DIAGNOSIS — R2681 Unsteadiness on feet: Secondary | ICD-10-CM | POA: Diagnosis not present

## 2022-04-29 DIAGNOSIS — M545 Low back pain, unspecified: Secondary | ICD-10-CM | POA: Diagnosis not present

## 2022-05-04 DIAGNOSIS — Z9181 History of falling: Secondary | ICD-10-CM | POA: Diagnosis not present

## 2022-05-04 DIAGNOSIS — M545 Low back pain, unspecified: Secondary | ICD-10-CM | POA: Diagnosis not present

## 2022-05-04 DIAGNOSIS — M6281 Muscle weakness (generalized): Secondary | ICD-10-CM | POA: Diagnosis not present

## 2022-05-04 DIAGNOSIS — R2681 Unsteadiness on feet: Secondary | ICD-10-CM | POA: Diagnosis not present

## 2022-05-06 DIAGNOSIS — Z9181 History of falling: Secondary | ICD-10-CM | POA: Diagnosis not present

## 2022-05-06 DIAGNOSIS — M6281 Muscle weakness (generalized): Secondary | ICD-10-CM | POA: Diagnosis not present

## 2022-05-06 DIAGNOSIS — M545 Low back pain, unspecified: Secondary | ICD-10-CM | POA: Diagnosis not present

## 2022-05-06 DIAGNOSIS — R2681 Unsteadiness on feet: Secondary | ICD-10-CM | POA: Diagnosis not present

## 2022-05-11 DIAGNOSIS — R2681 Unsteadiness on feet: Secondary | ICD-10-CM | POA: Diagnosis not present

## 2022-05-11 DIAGNOSIS — M6281 Muscle weakness (generalized): Secondary | ICD-10-CM | POA: Diagnosis not present

## 2022-05-11 DIAGNOSIS — Z9181 History of falling: Secondary | ICD-10-CM | POA: Diagnosis not present

## 2022-05-11 DIAGNOSIS — M545 Low back pain, unspecified: Secondary | ICD-10-CM | POA: Diagnosis not present

## 2022-05-13 DIAGNOSIS — M6281 Muscle weakness (generalized): Secondary | ICD-10-CM | POA: Diagnosis not present

## 2022-05-13 DIAGNOSIS — M545 Low back pain, unspecified: Secondary | ICD-10-CM | POA: Diagnosis not present

## 2022-05-13 DIAGNOSIS — Z9181 History of falling: Secondary | ICD-10-CM | POA: Diagnosis not present

## 2022-05-13 DIAGNOSIS — R2681 Unsteadiness on feet: Secondary | ICD-10-CM | POA: Diagnosis not present

## 2022-05-18 DIAGNOSIS — Z9181 History of falling: Secondary | ICD-10-CM | POA: Diagnosis not present

## 2022-05-18 DIAGNOSIS — M545 Low back pain, unspecified: Secondary | ICD-10-CM | POA: Diagnosis not present

## 2022-05-18 DIAGNOSIS — R2681 Unsteadiness on feet: Secondary | ICD-10-CM | POA: Diagnosis not present

## 2022-05-18 DIAGNOSIS — M6281 Muscle weakness (generalized): Secondary | ICD-10-CM | POA: Diagnosis not present

## 2022-05-20 ENCOUNTER — Non-Acute Institutional Stay (SKILLED_NURSING_FACILITY): Payer: Medicare Other | Admitting: Internal Medicine

## 2022-05-20 ENCOUNTER — Encounter: Payer: Self-pay | Admitting: Internal Medicine

## 2022-05-20 DIAGNOSIS — I5189 Other ill-defined heart diseases: Secondary | ICD-10-CM | POA: Diagnosis not present

## 2022-05-20 DIAGNOSIS — M545 Low back pain, unspecified: Secondary | ICD-10-CM | POA: Diagnosis not present

## 2022-05-20 DIAGNOSIS — E039 Hypothyroidism, unspecified: Secondary | ICD-10-CM | POA: Diagnosis not present

## 2022-05-20 DIAGNOSIS — K219 Gastro-esophageal reflux disease without esophagitis: Secondary | ICD-10-CM

## 2022-05-20 DIAGNOSIS — I10 Essential (primary) hypertension: Secondary | ICD-10-CM

## 2022-05-20 DIAGNOSIS — D61818 Other pancytopenia: Secondary | ICD-10-CM | POA: Diagnosis not present

## 2022-05-20 DIAGNOSIS — Z9181 History of falling: Secondary | ICD-10-CM | POA: Diagnosis not present

## 2022-05-20 DIAGNOSIS — E782 Mixed hyperlipidemia: Secondary | ICD-10-CM | POA: Diagnosis not present

## 2022-05-20 DIAGNOSIS — F418 Other specified anxiety disorders: Secondary | ICD-10-CM

## 2022-05-20 DIAGNOSIS — R413 Other amnesia: Secondary | ICD-10-CM

## 2022-05-20 DIAGNOSIS — F339 Major depressive disorder, recurrent, unspecified: Secondary | ICD-10-CM

## 2022-05-20 DIAGNOSIS — M6281 Muscle weakness (generalized): Secondary | ICD-10-CM | POA: Diagnosis not present

## 2022-05-20 DIAGNOSIS — R2681 Unsteadiness on feet: Secondary | ICD-10-CM | POA: Diagnosis not present

## 2022-05-20 NOTE — Progress Notes (Signed)
Location:   [place] Room Number: [id number] Place of Service:  SNF [phone]) Provider:  Veleta Miners, MD  Virgie Dad, MD  Patient Care Team: Virgie Dad, MD as PCP - General (Internal Medicine) Mast, Man X, NP as Nurse Practitioner (Internal Medicine)  Extended Emergency Contact Information Primary Emergency Contact: Rinks,[name] Address: [address]          [place],  [address] [place] Phone: [phone] Mobile Phone: [phone] Relation: Son Secondary Emergency Contact: Harbison, Kathi [place] Phone: [phone] Mobile Phone: [phone] Relation: Other  Code Status:  DNR Goals of care: Advanced Directive information    05/20/2022    3:12 PM  Advanced Directives  Does Patient Have a Medical Advance Directive? Yes  Type of [employer] of [place];Out of facility DNR (pink MOST or yellow form)  Does patient want to make changes to medical advance directive? No - Patient declined  Copy of [place] in Chart? Yes - validated most recent copy scanned in chart (See row information)     Chief Complaint  Patient presents with   Medical Management of Chronic Issues    Routine follow up   Quality Metric Gaps    Medicare annual wellness visit due   Immunizations    Pneumonia vaccine, flu vaccine and COVID booster due    HPI:  [name] [name] is a 88 y.o. female seen today for medical management of chronic diseases.    Lives in SNF Patient has h/o Pancytopenia , h/o Chronic Back Pain, Hypertension, Hypothyroidism, Hypertension, Recurrent Cystitis, Osteoporosis and Major Depression   Right Trimalleolar Fracture. S/P ORIF in 09/22    She is stable. No new Nursing issues. No Behavior issues Per his son today he gets confused in the morning and usually calls them But then gets better as day goes by Just recovered from Pneumonia and seems to be doing well Off oxygen NO SOB Her  weight is stable Do transfers with mild assist Mostly wheelchair based  No Falls Wt Readings from Last 3 Encounters:  05/20/22 156 lb 3.2 oz (70.9 kg)  04/14/22 156 lb 11.2 oz (71.1 kg)  04/06/22 156 lb 11.2 oz (71.1 kg)   Past Medical History:  Diagnosis Date   Anemia    Anxiety    Chronotropic incompetence 12/2012    potentially medication related; noted on CPET    DDD (degenerative disc disease)    Eczema    GERD (gastroesophageal reflux disease)    H/O hiatal hernia    Hx: UTI (urinary tract infection)    Hyperlipidemia    Hypertension    Hypothyroidism    Osteopenia    Septicemia ([place]) 2002   following UTI   Vertigo, benign positional    Past Surgical History:  Procedure Laterality Date   BREAST SURGERY     left biopsy   CATARACT EXTRACTION Right    2 weeks ago   CPET / MET - PFTS     Consistent with chronotropic incompetence; See attached report in the results section   DILATION AND CURETTAGE OF UTERUS     x 2   DOPPLER ECHOCARDIOGRAPHY  01/10/2013   Normal LV size and function. Normal EF. Air sclerosis but no stenosis   ESOPHAGOGASTRODUODENOSCOPY  05/04/2012   Procedure: ESOPHAGOGASTRODUODENOSCOPY (EGD);  Surgeon: Inda Castle, MD;  Location: [facility];  Service: Endoscopy;  Laterality: N/A;   EYE SURGERY  cataract extraction with ILO  ? eye   IR KYPHO EA ADDL LEVEL THORACIC OR LUMBAR  10/28/2016   IR KYPHO LUMBAR INC FX REDUCE BONE BX UNI/BIL CANNULATION INC/IMAGING  10/28/2016   IR KYPHO THORACIC WITH BONE BIOPSY  12/24/2016   IR RADIOLOGIST EVAL & MGMT  11/11/2016   KNEE ARTHROSCOPY  2011   right   ORIF ANKLE FRACTURE Right 03/27/2021   Procedure: OPEN REDUCTION INTERNAL FIXATION (ORIF) ANKLE FRACTURE;  Surgeon: Marchia Bond, MD;  Location: [facility];  Service: Orthopedics;  Laterality: Right;   TONSILLECTOMY     TOTAL KNEE ARTHROPLASTY  04/24/2012   Procedure: TOTAL KNEE ARTHROPLASTY;  Surgeon: Gearlean Alf, MD;  Location: [facility];  Service:  Orthopedics;  Laterality: Right;   TRANSTHORACIC ECHOCARDIOGRAM  02/2018   Ordered by PCP for "congestive heart failure " -EF 60 M 65%.  GR 1 DD.  No R WMA--- > ESSENTIALLY NORMAL    No Known Allergies  Allergies as of 05/20/2022   No Known Allergies      Medication List        Accurate as of May 20, 2022  3:47 PM. If you have any questions, ask your nurse or doctor.          STOP taking these medications    cetirizine 10 MG tablet Commonly known as: ZYRTEC Stopped by: Virgie Dad, MD   fish oil-omega-3 fatty acids 1000 MG capsule Stopped by: Virgie Dad, MD   ipratropium-albuterol 0.5-2.5 (3) MG/3ML Soln Commonly known as: DUONEB Stopped by: Virgie Dad, MD   lidocaine 4 % Stopped by: Virgie Dad, MD       TAKE these medications    acetaminophen 500 MG tablet Commonly known as: TYLENOL Take 1,000 mg by mouth every 8 (eight) hours. For pain   ALPRAZolam 0.25 MG tablet Commonly known as: XANAX Take 1 tablet (0.25 mg total) by mouth 2 (two) times daily as needed for anxiety. Don't give unless very anxious   ARTIFICIAL TEARS OP Place 1 drop into both eyes in the morning, at noon, and at bedtime.   atorvastatin 10 MG tablet Commonly known as: LIPITOR Take 5 mg by mouth at bedtime.   Biofreeze 4 % Gel Generic drug: Menthol (Topical Analgesic) Apply topically 2 (two) times daily as needed. Apply thin layer to left thigh   buPROPion 150 MG 12 hr tablet Commonly known as: WELLBUTRIN SR Take 1 tablet (150 mg total) by mouth 2 (two) times daily after a meal.   calcium citrate-vitamin D 315-200 MG-UNIT tablet Commonly known as: CITRACAL+D Take 1 tablet by mouth 2 (two) times daily.   Camphor-Menthol-Methyl Sal 3.07-31-08 % Ptch Place 1 patch onto the skin See admin instructions. Apply one patch onto the skin of the  lower back daily for back pain   diclofenac Sodium 1 % Gel Commonly known as: VOLTAREN Apply 4 g topically in the morning and  at bedtime. Apply to bilateral ankles BID   ferrous sulfate 325 (65 FE) MG EC tablet Take 325 mg by mouth every Monday, Wednesday, and Friday. With a meal   gabapentin 100 MG capsule Commonly known as: NEURONTIN Take 100 mg by mouth 2 (two) times daily.   gabapentin 100 MG capsule Commonly known as: NEURONTIN Take 100 mg by mouth daily.   hydrocortisone cream 1 % Apply 1 application topically daily as needed for itching.   levothyroxine 75 MCG tablet Commonly known as: SYNTHROID Take 75 mcg by mouth  every morning.   losartan 25 MG tablet Commonly known as: COZAAR Take 25 mg by mouth every morning.   nystatin powder Commonly known as: MYCOSTATIN/NYSTOP Apply 1 application  topically as needed (yeast).   nystatin cream Commonly known as: MYCOSTATIN Apply 1 Application topically as needed.   omeprazole 40 MG capsule Commonly known as: PRILOSEC Take 40 mg by mouth every morning.   OxyCODONE HCl (Abuse Deter) 5 MG Taba Commonly known as: OXAYDO Take 2.5 mg by mouth as needed. Give 30 minutes before transportation to ortho appointments.   polyethylene glycol 17 g packet Commonly known as: MIRALAX / GLYCOLAX Take 17 g by mouth every other day.   potassium chloride SA 20 MEQ tablet Commonly known as: KLOR-CON M Take 20 mEq by mouth as directed. Monday, Tuesday, Wednesday, Thursday, and Friday.   senna 8.6 MG Tabs tablet Commonly known as: SENOKOT Take 2 tablets by mouth at bedtime.   sertraline 25 MG tablet Commonly known as: ZOLOFT Take 75 mg by mouth every morning.   torsemide 20 MG tablet Commonly known as: DEMADEX Take 20 mg by mouth as directed. Once A Day on Mon, Tue, Wed, Thu, Fri   zinc oxide 20 % ointment Apply 1 application topically See admin instructions. Apply topically to per/buttocks after every incontinent episode and as needed for redness        Review of Systems  Constitutional:  Negative for activity change and appetite change.  HENT:  Negative.    Respiratory:  Negative for cough and shortness of breath.   Cardiovascular:  Negative for leg swelling.  Gastrointestinal:  Negative for constipation.  Genitourinary: Negative.   Musculoskeletal:  Positive for gait problem. Negative for arthralgias and myalgias.  Skin: Negative.   Neurological:  Negative for dizziness and weakness.  Psychiatric/Behavioral:  Positive for confusion. Negative for dysphoric mood and sleep disturbance.     Immunization History  Administered Date(s) Administered   Influenza, High Dose Seasonal PF 04/26/2016, 03/22/2017, 05/08/2019   Influenza,inj,Quad PF,6+ Mos 04/27/2018   Influenza-Unspecified 04/22/2011, 04/04/2012, 05/03/2013, 05/04/2013, 04/17/2014, 04/19/2015, 04/26/2016, 04/22/2017, 05/20/2021   Moderna SARS-COV2 Booster Vaccination 06/09/2020, 12/31/2020   Moderna Sars-Covid-2 Vaccination 07/30/2019, 08/27/2019, 01/06/2022   Pfizer Covid-19 Vaccine Bivalent Booster 39yr & up 04/15/2021   Pneumococcal Polysaccharide-23 08/26/2003, 09/06/2011, 05/15/2019   Pneumococcal-Unspecified 12/20/2013   Td 09/26/2006   Tdap 05/15/2019   Zoster Recombinat (Shingrix) 11/21/2017, 04/08/2018   Zoster, Live 10/25/2007   Pertinent  Health Maintenance Due  Topic Date Due   INFLUENZA VACCINE  02/23/2022   DEXA SCAN  Completed      03/31/2021    1:00 AM 03/31/2021    7:47 AM 03/31/2021   10:00 PM 04/04/2021    6:13 PM 09/18/2021   12:48 PM  Fall Risk  Falls in the past year?     1  Was there an injury with Fall?     1  Fall Risk Category Calculator     3  Fall Risk Category     High  Patient Fall Risk Level High fall risk High fall risk High fall risk High fall risk High fall risk  Patient at Risk for Falls Due to     History of fall(s);Impaired balance/gait;Impaired mobility;Orthopedic patient  Fall risk Follow up     Falls evaluation completed;Education provided;Falls prevention discussed   Functional Status Survey:    Vitals:   05/20/22 1506   BP: 126/75  Pulse: 78  Resp: 18  Temp: (!) 96.2 F (35.7 C)  SpO2: 97%  Weight: 156 lb 3.2 oz (70.9 kg)  Height: '5\' 4"'  (1.626 m)   Body mass index is 26.81 kg/m. Physical Exam Vitals reviewed.  Constitutional:      Appearance: Normal appearance.  HENT:     Head: Normocephalic.     Nose: Nose normal.     Mouth/Throat:     Mouth: Mucous membranes are moist.     Pharynx: Oropharynx is clear.  Eyes:     Pupils: Pupils are equal, round, and reactive to light.  Cardiovascular:     Rate and Rhythm: Normal rate and regular rhythm.     Pulses: Normal pulses.     Heart sounds: Normal heart sounds. No murmur heard. Pulmonary:     Effort: Pulmonary effort is normal.     Breath sounds: Normal breath sounds.  Abdominal:     General: Abdomen is flat. Bowel sounds are normal.     Palpations: Abdomen is soft.  Musculoskeletal:        General: No swelling.     Cervical back: Neck supple.  Skin:    General: Skin is warm.  Neurological:     General: No focal deficit present.     Mental Status: She is alert and oriented to person, place, and time.  Psychiatric:        Mood and Affect: Mood normal.        Thought Content: Thought content normal.     Labs reviewed: Recent Labs    09/24/21 0000 10/02/21 0000 10/08/21 0000  NA 138 137 137  K 4.3 4.0 4.4  CL 103 102 99  CO2 25* 28* 36*  BUN 26* 19 16  CREATININE 1.2* 1.0 0.9  CALCIUM 9.3 8.9 8.9   Recent Labs    07/10/21 0000 09/24/21 0000 10/08/21 0000  AST '22 18 16  ' ALT '19 10 7  ' ALKPHOS 91 86 81  ALBUMIN 3.8 3.5 3.2*   Recent Labs    09/24/21 0000 10/02/21 0000 10/08/21 0000 10/30/21 0000  WBC 12.6 10.8 11.0 5.6  NEUTROABS 10,206.00 9,050.00  --  4,256.00  HGB 8.3* 7.8* 8.0* 8.4*  HCT 28* 26* 26* 28*  PLT 21* 181 122* 104*   Lab Results  Component Value Date   TSH 3.49 09/17/2021   No results found for: "HGBA1C" Lab Results  Component Value Date   CHOL 125 10/30/2021   HDL 38 10/30/2021   LDLCALC  67 10/30/2021   TRIG 123 10/30/2021   CHOLHDL 3.8 11/23/2018    Significant Diagnostic Results in last 30 days:  No results found.  Assessment/Plan 1. Essential hypertension On Losartan  2. Memory impairment Continue support in SNF Family does not  want anything aggressive  3. Diastolic dysfunction On Torsemide PRN  4. Acquired hypothyroidism Tsh normal in 02/23  5. Gastroesophageal reflux disease, unspecified whether esophagitis present On Prilosec  6. Depression with anxiety Wellbutrin and Zoloft  7. Mixed hyperlipidemia Continue statin  8. Pancytopenia (HCC) Repeat CBC  9 Midline Back pain Has h/o Compression fractures On Oxycodone and Neurontin     Family/ staff Communication:   Labs/tests ordered:  CBC,CMP,TSH

## 2022-05-24 DIAGNOSIS — R2681 Unsteadiness on feet: Secondary | ICD-10-CM | POA: Diagnosis not present

## 2022-05-24 DIAGNOSIS — M545 Low back pain, unspecified: Secondary | ICD-10-CM | POA: Diagnosis not present

## 2022-05-24 DIAGNOSIS — Z9181 History of falling: Secondary | ICD-10-CM | POA: Diagnosis not present

## 2022-05-24 DIAGNOSIS — M6281 Muscle weakness (generalized): Secondary | ICD-10-CM | POA: Diagnosis not present

## 2022-05-27 DIAGNOSIS — R2681 Unsteadiness on feet: Secondary | ICD-10-CM | POA: Diagnosis not present

## 2022-05-27 DIAGNOSIS — Z9181 History of falling: Secondary | ICD-10-CM | POA: Diagnosis not present

## 2022-05-27 DIAGNOSIS — M6281 Muscle weakness (generalized): Secondary | ICD-10-CM | POA: Diagnosis not present

## 2022-05-27 DIAGNOSIS — M545 Low back pain, unspecified: Secondary | ICD-10-CM | POA: Diagnosis not present

## 2022-05-30 DIAGNOSIS — Z9181 History of falling: Secondary | ICD-10-CM | POA: Diagnosis not present

## 2022-05-30 DIAGNOSIS — M545 Low back pain, unspecified: Secondary | ICD-10-CM | POA: Diagnosis not present

## 2022-05-30 DIAGNOSIS — M6281 Muscle weakness (generalized): Secondary | ICD-10-CM | POA: Diagnosis not present

## 2022-05-30 DIAGNOSIS — R2681 Unsteadiness on feet: Secondary | ICD-10-CM | POA: Diagnosis not present

## 2022-05-31 DIAGNOSIS — E039 Hypothyroidism, unspecified: Secondary | ICD-10-CM | POA: Diagnosis not present

## 2022-05-31 DIAGNOSIS — D649 Anemia, unspecified: Secondary | ICD-10-CM | POA: Diagnosis not present

## 2022-05-31 LAB — CBC AND DIFFERENTIAL
HCT: 26 — AB (ref 36–46)
Hemoglobin: 7.9 — AB (ref 12.0–16.0)
Platelets: 96 10*3/uL — AB (ref 150–400)
WBC: 5.7

## 2022-05-31 LAB — BASIC METABOLIC PANEL
BUN: 17 (ref 4–21)
CO2: 28 — AB (ref 13–22)
Chloride: 107 (ref 99–108)
Creatinine: 1.2 — AB (ref 0.5–1.1)
Glucose: 97
Potassium: 3.7 mEq/L (ref 3.5–5.1)
Sodium: 139 (ref 137–147)

## 2022-05-31 LAB — COMPREHENSIVE METABOLIC PANEL
Albumin: 3.7 (ref 3.5–5.0)
Calcium: 9.1 (ref 8.7–10.7)
Globulin: 2.7
eGFR: 46

## 2022-05-31 LAB — TSH: TSH: 3.25 (ref 0.41–5.90)

## 2022-05-31 LAB — CBC: RBC: 2.87 — AB (ref 3.87–5.11)

## 2022-05-31 LAB — HEPATIC FUNCTION PANEL
ALT: 16 U/L (ref 7–35)
AST: 18 (ref 13–35)
Alkaline Phosphatase: 92 (ref 25–125)
Bilirubin, Total: 0.2

## 2022-06-02 DIAGNOSIS — Z9181 History of falling: Secondary | ICD-10-CM | POA: Diagnosis not present

## 2022-06-02 DIAGNOSIS — R2681 Unsteadiness on feet: Secondary | ICD-10-CM | POA: Diagnosis not present

## 2022-06-02 DIAGNOSIS — M545 Low back pain, unspecified: Secondary | ICD-10-CM | POA: Diagnosis not present

## 2022-06-02 DIAGNOSIS — M6281 Muscle weakness (generalized): Secondary | ICD-10-CM | POA: Diagnosis not present

## 2022-06-08 DIAGNOSIS — Z9181 History of falling: Secondary | ICD-10-CM | POA: Diagnosis not present

## 2022-06-08 DIAGNOSIS — R2681 Unsteadiness on feet: Secondary | ICD-10-CM | POA: Diagnosis not present

## 2022-06-08 DIAGNOSIS — M545 Low back pain, unspecified: Secondary | ICD-10-CM | POA: Diagnosis not present

## 2022-06-08 DIAGNOSIS — M6281 Muscle weakness (generalized): Secondary | ICD-10-CM | POA: Diagnosis not present

## 2022-06-09 DIAGNOSIS — Z9181 History of falling: Secondary | ICD-10-CM | POA: Diagnosis not present

## 2022-06-09 DIAGNOSIS — M545 Low back pain, unspecified: Secondary | ICD-10-CM | POA: Diagnosis not present

## 2022-06-09 DIAGNOSIS — M6281 Muscle weakness (generalized): Secondary | ICD-10-CM | POA: Diagnosis not present

## 2022-06-09 DIAGNOSIS — R2681 Unsteadiness on feet: Secondary | ICD-10-CM | POA: Diagnosis not present

## 2022-06-14 ENCOUNTER — Encounter: Payer: Self-pay | Admitting: Orthopedic Surgery

## 2022-06-14 ENCOUNTER — Non-Acute Institutional Stay (SKILLED_NURSING_FACILITY): Payer: Medicare Other | Admitting: Orthopedic Surgery

## 2022-06-14 DIAGNOSIS — R413 Other amnesia: Secondary | ICD-10-CM

## 2022-06-14 DIAGNOSIS — D61818 Other pancytopenia: Secondary | ICD-10-CM

## 2022-06-14 DIAGNOSIS — E039 Hypothyroidism, unspecified: Secondary | ICD-10-CM

## 2022-06-14 DIAGNOSIS — G8929 Other chronic pain: Secondary | ICD-10-CM

## 2022-06-14 DIAGNOSIS — K219 Gastro-esophageal reflux disease without esophagitis: Secondary | ICD-10-CM | POA: Diagnosis not present

## 2022-06-14 DIAGNOSIS — M6281 Muscle weakness (generalized): Secondary | ICD-10-CM | POA: Diagnosis not present

## 2022-06-14 DIAGNOSIS — E782 Mixed hyperlipidemia: Secondary | ICD-10-CM

## 2022-06-14 DIAGNOSIS — H6123 Impacted cerumen, bilateral: Secondary | ICD-10-CM

## 2022-06-14 DIAGNOSIS — Z9181 History of falling: Secondary | ICD-10-CM | POA: Diagnosis not present

## 2022-06-14 DIAGNOSIS — F418 Other specified anxiety disorders: Secondary | ICD-10-CM

## 2022-06-14 DIAGNOSIS — M545 Low back pain, unspecified: Secondary | ICD-10-CM | POA: Diagnosis not present

## 2022-06-14 DIAGNOSIS — R2681 Unsteadiness on feet: Secondary | ICD-10-CM | POA: Diagnosis not present

## 2022-06-14 DIAGNOSIS — I5189 Other ill-defined heart diseases: Secondary | ICD-10-CM | POA: Diagnosis not present

## 2022-06-14 DIAGNOSIS — I10 Essential (primary) hypertension: Secondary | ICD-10-CM | POA: Diagnosis not present

## 2022-06-14 NOTE — Progress Notes (Signed)
Location:  [facility] Room Number: 35 Place of Service:  SNF [phone]) Provider:  Yvonna Alanis, NP   Virgie Dad, MD  Patient Care Team: Virgie Dad, MD as PCP - General (Internal Medicine) Mast, Man X, NP as Nurse Practitioner (Internal Medicine)  Extended Emergency Contact Information Primary Emergency Contact: Kopec,[name] Address: [address]          [place], [place] [address] [place] Phone: [phone] Mobile Phone: [phone] Relation: Son Secondary Emergency Contact: Balsam, Kathi [place] Phone: [phone] Mobile Phone: [phone] Relation: Other  Code Status:  DNR Goals of care: Advanced Directive information    05/20/2022    3:12 PM  Advanced Directives  Does Patient Have a Medical Advance Directive? Yes  Type of Par[facility] of [place];Out of facility DNR (pink MOST or yellow form)  Does patient want to make changes to medical advance directive? No - Patient declined  Copy of [place] in Chart? Yes - validated most recent copy scanned in chart (See row information)     Chief Complaint  Patient presents with   Medical Management of Chronic Issues    HPI:  Pt is a 88 y.o. female seen today for medical management of chronic diseases.    HTN- BUN/creat 17/1.15 06/01/2022, remains on losartan, NAS diet HLD- LDL 67 10/30/2021, remains on Lipitor Diastolic dysfunction- BNP 289 10/08/2021, Echo 03/18 noted normal LV systolic function and mild LV hypertrophy, remains in torsemide prn Impaired memory- BIMS score 13/15 03/09/2022, has periods of increased anxiety/confusion Anemia/pancytopenia - Hgb 7.9 (11/07)> was 9.3,  platelets 96 (was 104), no further workup, remains on ferrous sulfate Hypothyroidism- TSH 3.49 09/17/2021, remains on levothyroxine Chronic back pain- 04/04/2021 CT lumbar spine noted chronic compression fractures to T11, L1 and L2, reports  intermittent pain to lower back, working with PT/OT, no recent falls GERD- hgb 7.9 06/01/2022, remains on Prilosec Depression with anxiety- no mood changes, Na+ 139 06/01/2022, remains on Wellbutrin, Zoloft and xanax prn  Recent blood pressures:  11/14- 128/63  11/07- 118/65  10/31- 128/60  Recent weights:  11/01- not recorded  10/03- 156.2 lbs  09/04- 156.7 lbs     Past Medical History:  Diagnosis Date   Anemia    Anxiety    Chronotropic incompetence 12/2012    potentially medication related; noted on CPET    DDD (degenerative disc disease)    Eczema    GERD (gastroesophageal reflux disease)    H/O hiatal hernia    Hx: UTI (urinary tract infection)    Hyperlipidemia    Hypertension    Hypothyroidism    Osteopenia    Septicemia ([facility]) 2002   following UTI   Vertigo, benign positional    Past Surgical History:  Procedure Laterality Date   BREAST SURGERY     left biopsy   CATARACT EXTRACTION Right    2 weeks ago   CPET / MET - PFTS     Consistent with chronotropic incompetence; See attached report in the results section   DILATION AND CURETTAGE OF UTERUS     x 2   DOPPLER ECHOCARDIOGRAPHY  01/10/2013   Normal LV size and function. Normal EF. Air sclerosis but no stenosis   ESOPHAGOGASTRODUODENOSCOPY  05/04/2012   Procedure: ESOPHAGOGASTRODUODENOSCOPY (EGD);  Surgeon: Inda Castle, MD;  Location: [facility];  Service: Endoscopy;  Laterality: N/A;   EYE SURGERY     cataract extraction  with ILO  ? eye   IR KYPHO EA ADDL LEVEL THORACIC OR LUMBAR  10/28/2016   IR KYPHO LUMBAR INC FX REDUCE BONE BX UNI/BIL CANNULATION INC/IMAGING  10/28/2016   IR KYPHO THORACIC WITH BONE BIOPSY  12/24/2016   IR RADIOLOGIST EVAL & MGMT  11/11/2016   KNEE ARTHROSCOPY  2011   right   ORIF ANKLE FRACTURE Right 03/27/2021   Procedure: OPEN REDUCTION INTERNAL FIXATION (ORIF) ANKLE FRACTURE;  Surgeon: Marchia Bond, MD;  Location: [facility];  Service: Orthopedics;  Laterality: Right;    TONSILLECTOMY     TOTAL KNEE ARTHROPLASTY  04/24/2012   Procedure: TOTAL KNEE ARTHROPLASTY;  Surgeon: Gearlean Alf, MD;  Location: [facility];  Service: Orthopedics;  Laterality: Right;   TRANSTHORACIC ECHOCARDIOGRAM  02/2018   Ordered by PCP for "congestive heart failure " -EF 60 M 65%.  GR 1 DD.  No R WMA--- > ESSENTIALLY NORMAL    No Known Allergies  Outpatient Encounter Medications as of 06/14/2022  Medication Sig   acetaminophen (TYLENOL) 500 MG tablet Take 1,000 mg by mouth every 8 (eight) hours. For pain   ALPRAZolam (XANAX) 0.25 MG tablet Take 1 tablet (0.25 mg total) by mouth 2 (two) times daily as needed for anxiety. Don't give unless very anxious   atorvastatin (LIPITOR) 10 MG tablet Take 5 mg by mouth at bedtime.   buPROPion (WELLBUTRIN SR) 150 MG 12 hr tablet Take 1 tablet (150 mg total) by mouth 2 (two) times daily after a meal.   calcium citrate-vitamin D (CITRACAL+D) 315-200 MG-UNIT tablet Take 1 tablet by mouth 2 (two) times daily.   Camphor-Menthol-Methyl Sal 3.07-31-08 % PTCH Place 1 patch onto the skin See admin instructions. Apply one patch onto the skin of the  lower back daily for back pain   Carboxymethylcellulose Sodium (ARTIFICIAL TEARS OP) Place 1 drop into both eyes in the morning, at noon, and at bedtime.   diclofenac Sodium (VOLTAREN) 1 % GEL Apply 4 g topically in the morning and at bedtime. Apply to bilateral ankles BID   ferrous sulfate 325 (65 FE) MG EC tablet Take 325 mg by mouth every Monday, Wednesday, and Friday. With a meal   gabapentin (NEURONTIN) 100 MG capsule Take 100 mg by mouth 2 (two) times daily.   gabapentin (NEURONTIN) 100 MG capsule Take 200 mg by mouth at bedtime.   hydrocortisone cream 1 % Apply 1 application topically daily as needed for itching.   levothyroxine (SYNTHROID) 75 MCG tablet Take 75 mcg by mouth every morning.   losartan (COZAAR) 25 MG tablet Take 25 mg by mouth every morning.   Menthol, Topical Analgesic, (BIOFREEZE) 4 % GEL  Apply topically 2 (two) times daily as needed. Apply thin layer to left thigh   nystatin (MYCOSTATIN/NYSTOP) powder Apply 1 application  topically as needed (yeast).   nystatin cream (MYCOSTATIN) Apply 1 Application topically as needed.   omeprazole (PRILOSEC) 40 MG capsule Take 40 mg by mouth every morning.   OxyCODONE HCl, Abuse Deter, (OXAYDO) 5 MG TABA Take 2.5 mg by mouth as needed. Give 30 minutes before transportation to ortho appointments.   polyethylene glycol (MIRALAX / GLYCOLAX) 17 g packet Take 17 g by mouth every other day.   potassium chloride SA (KLOR-CON M) 20 MEQ tablet Take 20 mEq by mouth as directed. Monday, Tuesday, Wednesday, Thursday, and Friday.   senna (SENOKOT) 8.6 MG TABS tablet Take 2 tablets by mouth at bedtime.   sertraline (ZOLOFT) 25 MG tablet Take 75  mg by mouth every morning.   torsemide (DEMADEX) 20 MG tablet Take 20 mg by mouth as directed. Once A Day on Mon, Tue, Wed, Thu, Fri   zinc oxide 20 % ointment Apply 1 application topically See admin instructions. Apply topically to per/buttocks after every incontinent episode and as needed for redness   No facility-administered encounter medications on file as of 06/14/2022.    Review of Systems  Constitutional:  Negative for activity change, appetite change, chills, fatigue and fever.  HENT:  Negative for congestion and trouble swallowing.   Eyes:  Negative for visual disturbance.  Respiratory:  Positive for shortness of breath. Negative for cough and wheezing.   Cardiovascular:  Positive for leg swelling. Negative for chest pain.  Gastrointestinal:  Positive for constipation. Negative for abdominal distention, abdominal pain, diarrhea, nausea and vomiting.  Genitourinary:  Negative for dysuria, frequency and hematuria.  Musculoskeletal:  Positive for arthralgias, back pain and gait problem.  Skin:  Negative for wound.  Neurological:  Positive for weakness. Negative for dizziness and headaches.   Psychiatric/Behavioral:  Positive for confusion and dysphoric mood. Negative for sleep disturbance. The patient is nervous/anxious.     Immunization History  Administered Date(s) Administered   Influenza, High Dose Seasonal PF 04/26/2016, 03/22/2017, 05/08/2019   Influenza,inj,Quad PF,6+ Mos 04/27/2018   Influenza-Unspecified 04/22/2011, 04/04/2012, 05/03/2013, 05/04/2013, 04/17/2014, 04/19/2015, 04/26/2016, 04/22/2017, 05/20/2021   Moderna SARS-COV2 Booster Vaccination 06/09/2020, 12/31/2020   Moderna Sars-Covid-2 Vaccination 07/30/2019, 08/27/2019, 01/06/2022   Pfizer Covid-19 Vaccine Bivalent Booster 76yr & up 04/15/2021   Pneumococcal Polysaccharide-23 08/26/2003, 09/06/2011, 05/15/2019   Pneumococcal-Unspecified 12/20/2013   Td 09/26/2006   Tdap 05/15/2019   Zoster Recombinat (Shingrix) 11/21/2017, 04/08/2018   Zoster, Live 10/25/2007   Pertinent  Health Maintenance Due  Topic Date Due   INFLUENZA VACCINE  02/23/2022   DEXA SCAN  Completed      03/31/2021    1:00 AM 03/31/2021    7:47 AM 03/31/2021   10:00 PM 04/04/2021    6:13 PM 09/18/2021   12:48 PM  Fall Risk  Falls in the past year?     1  Was there an injury with Fall?     1  Fall Risk Category Calculator     3  Fall Risk Category     High  Patient Fall Risk Level _0   Patient at Risk for Falls Due to     History of fall(s);Impaired balance/gait;Impaired mobility;Orthopedic patient  Fall risk Follow up     Falls evaluation completed;Education provided;Falls prevention discussed   Functional Status Survey:    Vitals:   06/14/22 1420  BP: 128/63  Pulse: 75  Resp: 15  Temp: (!) 96.8 F (36 C)  SpO2: 91%  Weight: 156 lb 3.2 oz (70.9 kg)  Height: _1  (1.626 m)   Body mass index is 26.81 kg/m. Physical Exam Vitals reviewed.  Constitutional:      General: She is not in acute distress. HENT:     Head: Normocephalic.     Right Ear: There  is impacted cerumen.     Left Ear: There is impacted cerumen.     Nose: Nose normal.     Mouth/Throat:     Mouth: Mucous membranes are moist.  Eyes:     General:        Right eye: No discharge.        Left eye: No discharge.  Cardiovascular:  Rate and Rhythm: Normal rate and regular rhythm.     Pulses: Normal pulses.     Heart sounds: Normal heart sounds.  Pulmonary:     Effort: Pulmonary effort is normal. No respiratory distress.     Breath sounds: Normal breath sounds. No wheezing.  Abdominal:     General: Bowel sounds are normal. There is no distension.     Palpations: Abdomen is soft.     Tenderness: There is no abdominal tenderness. There is no guarding.  Musculoskeletal:     Cervical back: Neck supple.     Right lower leg: No edema.     Left lower leg: No edema.  Skin:    General: Skin is warm and dry.     Capillary Refill: Capillary refill takes less than 2 seconds.  Neurological:     General: No focal deficit present.     Mental Status: She is alert. Mental status is at baseline.     Motor: Weakness present.     Gait: Gait abnormal.     Comments: Wheelchair/walker  Psychiatric:        Mood and Affect: Mood normal.        Behavior: Behavior normal.     Labs reviewed: Recent Labs    09/24/21 0000 10/02/21 0000 10/08/21 0000  NA 138 137 137  K 4.3 4.0 4.4  CL 103 102 99  CO2 25* 28* 36*  BUN 26* 19 16  CREATININE 1.2* 1.0 0.9  CALCIUM 9.3 8.9 8.9   Recent Labs    07/10/21 0000 09/24/21 0000 10/08/21 0000  AST _0 ALT _1 ALKPHOS 91 86 81  ALBUMIN 3.8 3.5 3.2*   Recent Labs    09/24/21 0000 10/02/21 0000 10/08/21 0000 10/30/21 0000  WBC 12.6 10.8 11.0 5.6  NEUTROABS 10,206.00 9,050.00  --  4,256.00  HGB 8.3* 7.8* 8.0* 8.4*  HCT 28* 26* 26* 28*  PLT 21* 181 122* 104*   Lab Results  Component Value Date   TSH 3.49 09/17/2021   No results found for: "HGBA1C" Lab Results  Component Value Date   CHOL 125 10/30/2021   HDL  38 10/30/2021   LDLCALC 67 10/30/2021   TRIG 123 10/30/2021   CHOLHDL 3.8 11/23/2018    Significant Diagnostic Results in last 30 days:  No results found.  Assessment/Plan 1. Bilateral impacted cerumen - start debrox 5gtts to both ears BID x 5 days - flush ears with warm water when debrox complete  2. Essential hypertension - controlled with losartan  3. Mixed hyperlipidemia - con statin  4. Diastolic dysfunction - cont torsemide prn  5. Memory impairment - BIMS 13/15 - forgetful at times - no behaviors    6. Pancytopenia (HCC) - hgb 7.9, platelets 96 06/01/2022 - cont ferrous sulfate  7. Acquired hypothyroidism - TSH stable - cont levothyroxine  8. Chronic bilateral low back pain without sciatica - 03/2022 compression fx to T11/L1/L2 - intermittent pain - cont tylenol, Neurontin, and oxycodone  9. Gastroesophageal reflux disease, unspecified whether esophagitis present - cont omeprazole  10. Depression with anxiety - no mood changes - Na+ stable - cont wellbutrin, Zoloft and Xanax    Family/ staff Communication: plan discussed with patient and nurse  Labs/tests ordered:  none

## 2022-06-16 DIAGNOSIS — R2681 Unsteadiness on feet: Secondary | ICD-10-CM | POA: Diagnosis not present

## 2022-06-16 DIAGNOSIS — M545 Low back pain, unspecified: Secondary | ICD-10-CM | POA: Diagnosis not present

## 2022-06-16 DIAGNOSIS — M6281 Muscle weakness (generalized): Secondary | ICD-10-CM | POA: Diagnosis not present

## 2022-06-16 DIAGNOSIS — Z9181 History of falling: Secondary | ICD-10-CM | POA: Diagnosis not present

## 2022-06-22 DIAGNOSIS — R2681 Unsteadiness on feet: Secondary | ICD-10-CM | POA: Diagnosis not present

## 2022-06-22 DIAGNOSIS — M545 Low back pain, unspecified: Secondary | ICD-10-CM | POA: Diagnosis not present

## 2022-06-22 DIAGNOSIS — Z9181 History of falling: Secondary | ICD-10-CM | POA: Diagnosis not present

## 2022-06-22 DIAGNOSIS — M6281 Muscle weakness (generalized): Secondary | ICD-10-CM | POA: Diagnosis not present

## 2022-06-23 DIAGNOSIS — R2681 Unsteadiness on feet: Secondary | ICD-10-CM | POA: Diagnosis not present

## 2022-06-23 DIAGNOSIS — M545 Low back pain, unspecified: Secondary | ICD-10-CM | POA: Diagnosis not present

## 2022-06-23 DIAGNOSIS — M6281 Muscle weakness (generalized): Secondary | ICD-10-CM | POA: Diagnosis not present

## 2022-06-23 DIAGNOSIS — Z9181 History of falling: Secondary | ICD-10-CM | POA: Diagnosis not present

## 2022-06-29 DIAGNOSIS — R2681 Unsteadiness on feet: Secondary | ICD-10-CM | POA: Diagnosis not present

## 2022-06-29 DIAGNOSIS — M545 Low back pain, unspecified: Secondary | ICD-10-CM | POA: Diagnosis not present

## 2022-06-29 DIAGNOSIS — Z9181 History of falling: Secondary | ICD-10-CM | POA: Diagnosis not present

## 2022-06-29 DIAGNOSIS — M6281 Muscle weakness (generalized): Secondary | ICD-10-CM | POA: Diagnosis not present

## 2022-07-01 DIAGNOSIS — Z9181 History of falling: Secondary | ICD-10-CM | POA: Diagnosis not present

## 2022-07-01 DIAGNOSIS — M6281 Muscle weakness (generalized): Secondary | ICD-10-CM | POA: Diagnosis not present

## 2022-07-01 DIAGNOSIS — M545 Low back pain, unspecified: Secondary | ICD-10-CM | POA: Diagnosis not present

## 2022-07-01 DIAGNOSIS — R2681 Unsteadiness on feet: Secondary | ICD-10-CM | POA: Diagnosis not present

## 2022-07-05 DIAGNOSIS — Z9181 History of falling: Secondary | ICD-10-CM | POA: Diagnosis not present

## 2022-07-05 DIAGNOSIS — M545 Low back pain, unspecified: Secondary | ICD-10-CM | POA: Diagnosis not present

## 2022-07-05 DIAGNOSIS — R2681 Unsteadiness on feet: Secondary | ICD-10-CM | POA: Diagnosis not present

## 2022-07-05 DIAGNOSIS — M6281 Muscle weakness (generalized): Secondary | ICD-10-CM | POA: Diagnosis not present

## 2022-07-07 ENCOUNTER — Non-Acute Institutional Stay (SKILLED_NURSING_FACILITY): Payer: Medicare Other | Admitting: Orthopedic Surgery

## 2022-07-07 ENCOUNTER — Encounter: Payer: Self-pay | Admitting: Orthopedic Surgery

## 2022-07-07 DIAGNOSIS — K219 Gastro-esophageal reflux disease without esophagitis: Secondary | ICD-10-CM | POA: Diagnosis not present

## 2022-07-07 DIAGNOSIS — I10 Essential (primary) hypertension: Secondary | ICD-10-CM

## 2022-07-07 DIAGNOSIS — M545 Low back pain, unspecified: Secondary | ICD-10-CM | POA: Diagnosis not present

## 2022-07-07 DIAGNOSIS — R2681 Unsteadiness on feet: Secondary | ICD-10-CM | POA: Diagnosis not present

## 2022-07-07 DIAGNOSIS — E039 Hypothyroidism, unspecified: Secondary | ICD-10-CM | POA: Diagnosis not present

## 2022-07-07 DIAGNOSIS — F418 Other specified anxiety disorders: Secondary | ICD-10-CM

## 2022-07-07 DIAGNOSIS — D61818 Other pancytopenia: Secondary | ICD-10-CM | POA: Diagnosis not present

## 2022-07-07 DIAGNOSIS — I5189 Other ill-defined heart diseases: Secondary | ICD-10-CM

## 2022-07-07 DIAGNOSIS — R4189 Other symptoms and signs involving cognitive functions and awareness: Secondary | ICD-10-CM

## 2022-07-07 DIAGNOSIS — E782 Mixed hyperlipidemia: Secondary | ICD-10-CM | POA: Diagnosis not present

## 2022-07-07 DIAGNOSIS — Z9181 History of falling: Secondary | ICD-10-CM | POA: Diagnosis not present

## 2022-07-07 DIAGNOSIS — M6281 Muscle weakness (generalized): Secondary | ICD-10-CM | POA: Diagnosis not present

## 2022-07-07 DIAGNOSIS — G8929 Other chronic pain: Secondary | ICD-10-CM

## 2022-07-07 NOTE — Progress Notes (Signed)
Location:  [facility] Room Number: [place] of Service:  SNF [phone]) Provider:  Yvonna Alanis, NP   Virgie Dad, MD  Patient Care Team: Virgie Dad, MD as PCP - General (Internal Medicine) Mast, Man X, NP as Nurse Practitioner (Internal Medicine)  Extended Emergency Contact Information Primary Emergency Contact: Beber,[name] Address: [address]          [place], [place] [address] [place] Phone: [phone] Mobile Phone: [phone] Relation: Son Secondary Emergency Contact: Jaffer, Kathi Der [place] Phone: [phone] Mobile Phone: [phone] Relation: Other  Code Status:  DNR Goals of care: Advanced Directive information    07/07/2022   10:26 AM  Advanced Directives  Does Patient Have a Medical Advance Directive? Yes  Type of Paramedic of [place];Out of facility DNR (pink MOST or yellow form)  Does patient want to make changes to medical advance directive? No - Patient declined  Copy of [place] in Chart? Yes - validated most recent copy scanned in chart (See row information)     Chief Complaint  Patient presents with   Medical Management of Chronic Issues    Routine Visit with provider on site at [facility].   Quality Metric Gaps    Needs to  Discuss Pneumonia Vaccine.    HPI:  Pt is a 88 y.o. female seen today for medical management of chronic diseases.    Cognitive impairment- BIMS score 11/15 (11/23)> was 13/15 (08/15), CT head noted chronic microvascular ischemic disease 04/16/2022, no behaviors, not on medication HTN- BUN/creat 17/1.15 06/01/2022, remains on losartan, NAS diet HLD- LDL 67 10/30/2021, remains on Lipitor Diastolic dysfunction- BNP 289 10/08/2021, Echo 03/18 noted normal LV systolic function and mild LV hypertrophy, remains in torsemide prn Anemia/pancytopenia - Hgb 7.9 (11/07)> was 9.3,  platelets 96 (was 104), no further workup,  remains on ferrous sulfate Hypothyroidism- TSH 3.49 09/17/2021, remains on levothyroxine Chronic back pain- 04/04/2021 CT lumbar spine noted chronic compression fractures to T11, L1 and L2, remains on tylenol/gabapentin/voltaren gel/oxycodone Unstable gait- continues to work with PT, ambulating > 100 ft with walker/1+ assist GERD- hgb 7.9 06/01/2022, remains on Prilosec Depression with anxiety- no mood changes, Na+ 139 06/01/2022, remains on Wellbutrin, Zoloft and xanax prn  No recent falls or injuries.   Recent weights:  12/01- not recorded  11/08- 166.3 lbs  10/03- 156.2 lbs  09/07- 156.7 lbs  Recent blood pressures:  12/12- 130/71  12/05- 132/63  11/28- 123/60     Past Surgical History:  Procedure Laterality Date   BREAST SURGERY     left biopsy   CATARACT EXTRACTION Right    2 weeks ago   CPET / MET - PFTS     Consistent with chronotropic incompetence; See attached report in the results section   DILATION AND CURETTAGE OF UTERUS     x 2   DOPPLER ECHOCARDIOGRAPHY  01/10/2013   Normal LV size and function. Normal EF. Air sclerosis but no stenosis   ESOPHAGOGASTRODUODENOSCOPY  05/04/2012   Procedure: ESOPHAGOGASTRODUODENOSCOPY (EGD);  Surgeon: Inda Castle, MD;  Location: [facility];  Service: Endoscopy;  Laterality: N/A;   EYE SURGERY     cataract extraction with ILO  ? eye   IR KYPHO EA ADDL LEVEL THORACIC OR LUMBAR  10/28/2016   IR KYPHO LUMBAR INC FX REDUCE BONE BX UNI/BIL CANNULATION INC/IMAGING  10/28/2016   IR KYPHO THORACIC WITH BONE BIOPSY  12/24/2016   IR RADIOLOGIST EVAL & MGMT  11/11/2016   KNEE ARTHROSCOPY  2011   right   ORIF ANKLE FRACTURE Right 03/27/2021   Procedure: OPEN REDUCTION INTERNAL FIXATION (ORIF) ANKLE FRACTURE;  Surgeon: Marchia Bond, MD;  Location: [facility];  Service: Orthopedics;  Laterality: Right;   TONSILLECTOMY     TOTAL KNEE ARTHROPLASTY  04/24/2012   Procedure: TOTAL KNEE ARTHROPLASTY;  Surgeon: Gearlean Alf, MD;  Location: [facility];   Service: Orthopedics;  Laterality: Right;   TRANSTHORACIC ECHOCARDIOGRAM  02/2018   Ordered by PCP for "congestive heart failure " -EF 60 M 65%.  GR 1 DD.  No R WMA--- > ESSENTIALLY NORMAL    No Known Allergies  Outpatient Encounter Medications as of 07/07/2022  Medication Sig   acetaminophen (TYLENOL) 500 MG tablet Take 1,000 mg by mouth every 8 (eight) hours. For pain   ALPRAZolam (XANAX) 0.25 MG tablet Take 1 tablet (0.25 mg total) by mouth 2 (two) times daily as needed for anxiety. Don't give unless very anxious   atorvastatin (LIPITOR) 10 MG tablet Take 5 mg by mouth at bedtime.   buPROPion (WELLBUTRIN SR) 150 MG 12 hr tablet Take 1 tablet (150 mg total) by mouth 2 (two) times daily after a meal.   calcium citrate-vitamin D (CITRACAL+D) 315-200 MG-UNIT tablet Take 1 tablet by mouth 2 (two) times daily.   Camphor-Menthol-Methyl Sal 3.07-31-08 % PTCH Place 1 patch onto the skin See admin instructions. Apply one patch onto the skin of the  lower back daily for back pain   Carboxymethylcellulose Sodium (ARTIFICIAL TEARS OP) Place 1 drop into both eyes in the morning, at noon, and at bedtime.   diclofenac Sodium (VOLTAREN) 1 % GEL Apply 4 g topically in the morning and at bedtime. Apply to bilateral ankles BID   ferrous sulfate 325 (65 FE) MG EC tablet Take 325 mg by mouth every Monday, Wednesday, and Friday. With a meal   gabapentin (NEURONTIN) 100 MG capsule Take 100 mg by mouth 2 (two) times daily.   gabapentin (NEURONTIN) 100 MG capsule Take 200 mg by mouth at bedtime.   hydrocortisone cream 1 % Apply 1 application topically daily as needed for itching.   levothyroxine (SYNTHROID) 75 MCG tablet Take 75 mcg by mouth every morning.   losartan (COZAAR) 25 MG tablet Take 25 mg by mouth every morning.   Menthol, Topical Analgesic, (BIOFREEZE) 4 % GEL Apply topically 2 (two) times daily as needed. Apply thin layer to left thigh   nystatin (MYCOSTATIN/NYSTOP) powder Apply 1 application  topically  as needed (yeast).   nystatin cream (MYCOSTATIN) Apply 1 Application topically as needed.   omeprazole (PRILOSEC) 40 MG capsule Take 40 mg by mouth every morning.   OxyCODONE HCl, Abuse Deter, (OXAYDO) 5 MG TABA Take 2.5 mg by mouth as needed. Give 30 minutes before transportation to ortho appointments.   polyethylene glycol (MIRALAX / GLYCOLAX) 17 g packet Take 17 g by mouth every other day.   potassium chloride SA (KLOR-CON M) 20 MEQ tablet Take 20 mEq by mouth as directed. Monday, Tuesday, Wednesday, Thursday, and Friday.   senna (SENOKOT) 8.6 MG TABS tablet Take 2 tablets by mouth at bedtime.   sertraline (ZOLOFT) 25 MG tablet Take 75 mg by mouth every morning.   torsemide (DEMADEX) 20 MG tablet Take 20 mg by mouth as directed. Once A Day on Mon, Tue, Wed, Thu, Fri   zinc oxide 20 % ointment Apply 1 application topically See admin  instructions. Apply topically to per/buttocks after every incontinent episode and as needed for redness   No facility-administered encounter medications on file as of 07/07/2022.    Review of Systems  Constitutional:  Negative for activity change, appetite change, fatigue and fever.  HENT:  Negative for congestion and trouble swallowing.   Eyes:  Negative for visual disturbance.  Respiratory:  Negative for cough, shortness of breath and wheezing.   Cardiovascular:  Negative for chest pain and leg swelling.  Gastrointestinal:  Negative for abdominal distention, abdominal pain, constipation, diarrhea, nausea and vomiting.  Genitourinary:  Negative for dysuria, frequency and hematuria.  Musculoskeletal:  Positive for arthralgias, back pain and gait problem.  Skin:  Negative for wound.  Neurological:  Positive for weakness. Negative for dizziness and headaches.  Psychiatric/Behavioral:  Positive for confusion, dysphoric mood and sleep disturbance. The patient is nervous/anxious.     Immunization History  Administered Date(s) Administered   Fluad Quad(high Dose  65+) 05/19/2022   Influenza, High Dose Seasonal PF 04/26/2016, 03/22/2017, 05/08/2019   Influenza,inj,Quad PF,6+ Mos 04/27/2018   Influenza-Unspecified 04/22/2011, 04/04/2012, 05/03/2013, 05/04/2013, 04/17/2014, 04/19/2015, 04/26/2016, 04/22/2017, 05/20/2021   Moderna SARS-COV2 Booster Vaccination 06/09/2020, 12/31/2020   Moderna Sars-Covid-2 Vaccination 07/30/2019, 08/27/2019, 01/06/2022   PFIZER(Purple Top)SARS-COV-2 Vaccination 06/01/2022   Pfizer Covid-19 Vaccine Bivalent Booster 35yr & up 04/15/2021   Pneumococcal Polysaccharide-23 08/26/2003, 09/06/2011, 05/15/2019   Pneumococcal-Unspecified 12/20/2013   Td 09/26/2006   Tdap 05/15/2019   Zoster Recombinat (Shingrix) 11/21/2017, 04/08/2018   Zoster, Live 10/25/2007   Pertinent  Health Maintenance Due  Topic Date Due   INFLUENZA VACCINE  Completed   DEXA SCAN  Completed      03/31/2021    7:47 AM 03/31/2021   10:00 PM 04/04/2021    6:13 PM 09/18/2021   12:48 PM 07/07/2022   10:23 AM  Fall Risk  Falls in the past year?    1 1  Was there an injury with Fall?    1 1  Fall Risk Category Calculator    3 3  Fall Risk Category    High High  Patient Fall Risk Level _0   Patient at Risk for Falls Due to    History of fall(s);Impaired balance/gait;Impaired mobility;Orthopedic patient History of fall(s);Impaired balance/gait;Impaired mobility;Orthopedic patient  Fall risk Follow up    Falls evaluation completed;Education provided;Falls prevention discussed Falls evaluation completed   Functional Status Survey:    Vitals:   07/07/22 1016  BP: 130/71  Pulse: 69  Resp: 13  Temp: (!) 96.4 F (35.8 C)  SpO2: 92%  Weight: 166 lb 3 oz (75.4 kg)  Height: _1  (1.626 m)   Body mass index is 28.53 kg/m. Physical Exam Vitals reviewed.  Constitutional:      General: She is not in acute distress. HENT:     Head: Normocephalic.     Right Ear: There is no impacted  cerumen.     Left Ear: There is no impacted cerumen.     Nose: Nose normal.     Mouth/Throat:     Mouth: Mucous membranes are moist.  Eyes:     General:        Right eye: No discharge.        Left eye: No discharge.  Cardiovascular:     Rate and Rhythm: Normal rate and regular rhythm.     Pulses: Normal pulses.     Heart sounds: Normal heart sounds.  Pulmonary:     Effort: Pulmonary effort is normal. No respiratory distress.     Breath sounds: Normal breath sounds. No wheezing.  Abdominal:     General: Bowel sounds are normal. There is no distension.     Palpations: Abdomen is soft.     Tenderness: There is no abdominal tenderness.  Musculoskeletal:     Cervical back: Neck supple.     Right lower leg: No edema.     Left lower leg: No edema.  Skin:    General: Skin is warm and dry.     Capillary Refill: Capillary refill takes less than 2 seconds.  Neurological:     General: No focal deficit present.     Mental Status: She is alert. Mental status is at baseline.     Motor: Weakness present.     Gait: Gait abnormal.     Comments: Walker/wheelchair  Psychiatric:        Mood and Affect: Mood normal.        Behavior: Behavior normal.     Labs reviewed: Recent Labs    09/24/21 0000 10/02/21 0000 10/08/21 0000  NA 138 137 137  K 4.3 4.0 4.4  CL 103 102 99  CO2 25* 28* 36*  BUN 26* 19 16  CREATININE 1.2* 1.0 0.9  CALCIUM 9.3 8.9 8.9   Recent Labs    07/10/21 0000 09/24/21 0000 10/08/21 0000  AST _0 ALT _1 ALKPHOS 91 86 81  ALBUMIN 3.8 3.5 3.2*   Recent Labs    09/24/21 0000 10/02/21 0000 10/08/21 0000 10/30/21 0000  WBC 12.6 10.8 11.0 5.6  NEUTROABS 10,206.00 9,050.00  --  4,256.00  HGB 8.3* 7.8* 8.0* 8.4*  HCT 28* 26* 26* 28*  PLT 21* 181 122* 104*   Lab Results  Component Value Date   TSH 3.49 09/17/2021   No results found for: "HGBA1C" Lab Results  Component Value Date   CHOL 125 10/30/2021   HDL 38 10/30/2021   LDLCALC 67  10/30/2021   TRIG 123 10/30/2021   CHOLHDL 3.8 11/23/2018    Significant Diagnostic Results in last 30 days:  No results found.  Assessment/Plan 1. Cognitive impairment - BIMS 11/15 (11/23)> was 13/15 (08/15) - CT head noted chronic microvascular ischemia 03/2021 - no behaviors - not on medication - cont skilled nursing   2. Essential hypertension - controlled with losartan   3. Mixed hyperlipidemia - cont Lipitor  4. Diastolic dysfunction - compensated - cont torsemide  5. Pancytopenia ([place]) - hgb/platelets stable - cont ferrous sulfate  6. Acquired hypothyroidism - cont Levothyroxine  7. Chronic bilateral low back pain without sciatica - 03/2022 compression fx to T11/L1/L2  - pain improved - cont tylenol, gabapentin, oxycodone and voltaren gel  8. Unstable gait - working with PT - ambulating > 100 ft with walker/1+ assist  9. Gastroesophageal reflux disease, unspecified whether esophagitis present - hgb stable - cont omeprazole  10. Depression with anxiety - no mood changes, very pleasant - cont Wellbutrin, Zoloft and xanax prn    Family/ staff Communication: plan discussed with patient and nurse  Labs/tests ordered:  none

## 2022-07-07 NOTE — Progress Notes (Deleted)
Location:  [facility] Room Number: 35A Place of Service:  SNF (31) Provider:  Windell Moulding, NP   Patient Care Team: Virgie Dad, MD as PCP - General (Internal Medicine) Mast, Man X, NP as Nurse Practitioner (Internal Medicine)  Extended Emergency Contact Information Primary Emergency Contact: Donica,[name] Address: [address]          [place], [place] [address] [place] Phone: [phone] Mobile Phone: [phone] Relation: Son Secondary Emergency Contact: [name] [place] of [place] Phone: [phone] Mobile Phone: [phone] Relation: Other  Code Status:  DNR Goals of care: Advanced Directive information    07/07/2022   10:26 AM  Advanced Directives  Does Patient Have a Medical Advance Directive? Yes  Type of Paramedic of [place];Out of facility DNR (pink MOST or yellow form)  Does patient want to make changes to medical advance directive? No - Patient declined  Copy of [place] in Chart? Yes - validated most recent copy scanned in chart (See row information)     Chief Complaint  Patient presents with   Medical Management of Chronic Issues    Routine Visit with provider on site at [facility].   Quality Metric Gaps    Needs to  Discuss Pneumonia Vaccine.    HPI:  Pt is a 88 y.o. female seen today for medical management of chronic diseases.     Past Medical History:  Diagnosis Date   Anemia    Anxiety    Chronotropic incompetence 12/2012    potentially medication related; noted on CPET    DDD (degenerative disc disease)    Eczema    GERD (gastroesophageal reflux disease)    H/O hiatal hernia    Hx: UTI (urinary tract infection)    Hyperlipidemia    Hypertension    Hypothyroidism    Osteopenia    Septicemia ([place]) 2002   following UTI   Vertigo, benign positional    Past Surgical History:  Procedure Laterality Date   BREAST SURGERY     left  biopsy   CATARACT EXTRACTION Right    2 weeks ago   CPET / MET - PFTS     Consistent with chronotropic incompetence; See attached report in the results section   DILATION AND CURETTAGE OF UTERUS     x 2   DOPPLER ECHOCARDIOGRAPHY  01/10/2013   Normal LV size and function. Normal EF. Air sclerosis but no stenosis   ESOPHAGOGASTRODUODENOSCOPY  05/04/2012   Procedure: ESOPHAGOGASTRODUODENOSCOPY (EGD);  Surgeon: Inda Castle, MD;  Location: [facility];  Service: Endoscopy;  Laterality: N/A;   EYE SURGERY     cataract extraction with ILO  ? eye   IR KYPHO EA ADDL LEVEL THORACIC OR LUMBAR  10/28/2016   IR KYPHO LUMBAR INC FX REDUCE BONE BX UNI/BIL CANNULATION INC/IMAGING  10/28/2016   IR KYPHO THORACIC WITH BONE BIOPSY  12/24/2016   IR RADIOLOGIST EVAL & MGMT  11/11/2016   KNEE ARTHROSCOPY  2011   right   ORIF ANKLE FRACTURE Right 03/27/2021   Procedure: OPEN REDUCTION INTERNAL FIXATION (ORIF) ANKLE FRACTURE;  Surgeon: Marchia Bond, MD;  Location: [facility];  Service: Orthopedics;  Laterality: Right;   TONSILLECTOMY     TOTAL KNEE ARTHROPLASTY  04/24/2012   Procedure: TOTAL KNEE ARTHROPLASTY;  Surgeon: Gearlean Alf, MD;  [facility];  Service: Orthopedics;  Laterality: Right;   TRANSTHORACIC ECHOCARDIOGRAM  02/2018   Ordered  by PCP for "congestive heart failure " -EF 60 M 65%.  GR 1 DD.  No R WMA--- > ESSENTIALLY NORMAL    No Known Allergies  Outpatient Encounter Medications as of 07/07/2022  Medication Sig   acetaminophen (TYLENOL) 500 MG tablet Take 1,000 mg by mouth every 8 (eight) hours. For pain   ALPRAZolam (XANAX) 0.25 MG tablet Take 1 tablet (0.25 mg total) by mouth 2 (two) times daily as needed for anxiety. Don't give unless very anxious   atorvastatin (LIPITOR) 10 MG tablet Take 5 mg by mouth at bedtime.   buPROPion (WELLBUTRIN SR) 150 MG 12 hr tablet Take 1 tablet (150 mg total) by mouth 2 (two) times daily after a meal.   calcium citrate-vitamin D (CITRACAL+D) 315-200  MG-UNIT tablet Take 1 tablet by mouth 2 (two) times daily.   Camphor-Menthol-Methyl Sal 3.07-31-08 % PTCH Place 1 patch onto the skin See admin instructions. Apply one patch onto the skin of the  lower back daily for back pain   Carboxymethylcellulose Sodium (ARTIFICIAL TEARS OP) Place 1 drop into both eyes in the morning, at noon, and at bedtime.   diclofenac Sodium (VOLTAREN) 1 % GEL Apply 4 g topically in the morning and at bedtime. Apply to bilateral ankles BID   ferrous sulfate 325 (65 FE) MG EC tablet Take 325 mg by mouth every Monday, Wednesday, and Friday. With a meal   gabapentin (NEURONTIN) 100 MG capsule Take 100 mg by mouth 2 (two) times daily.   gabapentin (NEURONTIN) 100 MG capsule Take 200 mg by mouth at bedtime.   hydrocortisone cream 1 % Apply 1 application topically daily as needed for itching.   levothyroxine (SYNTHROID) 75 MCG tablet Take 75 mcg by mouth every morning.   losartan (COZAAR) 25 MG tablet Take 25 mg by mouth every morning.   Menthol, Topical Analgesic, (BIOFREEZE) 4 % GEL Apply topically 2 (two) times daily as needed. Apply thin layer to left thigh   nystatin (MYCOSTATIN/NYSTOP) powder Apply 1 application  topically as needed (yeast).   nystatin cream (MYCOSTATIN) Apply 1 Application topically as needed.   omega-3 fish oil (MAXEPA) 1000 MG CAPS capsule Take 1 capsule by mouth daily. EVERY MORNING *TAKE WITH FOOD*   omeprazole (PRILOSEC) 40 MG capsule Take 40 mg by mouth every morning.   OxyCODONE HCl, Abuse Deter, (OXAYDO) 5 MG TABA Take 2.5 mg by mouth as needed. Give 30 minutes before transportation to ortho appointments.   polyethylene glycol (MIRALAX / GLYCOLAX) 17 g packet Take 17 g by mouth every other day.   potassium chloride SA (KLOR-CON M) 20 MEQ tablet Take 20 mEq by mouth as directed. Monday, Tuesday, Wednesday, Thursday, and Friday.   senna (SENOKOT) 8.6 MG TABS tablet Take 2 tablets by mouth at bedtime.   sertraline (ZOLOFT) 25 MG tablet Take 75 mg by  mouth every morning.   torsemide (DEMADEX) 20 MG tablet Take 20 mg by mouth as directed. Once A Day on Mon, Tue, Wed, Thu, Fri   zinc oxide 20 % ointment Apply 1 application topically See admin instructions. Apply topically to per/buttocks after every incontinent episode and as needed for redness   No facility-administered encounter medications on file as of 07/07/2022.    Review of Systems  Immunization History  Administered Date(s) Administered   Fluad Quad(high Dose 65+) 05/19/2022   Influenza, High Dose Seasonal PF 04/26/2016, 03/22/2017, 05/08/2019   Influenza,inj,Quad PF,6+ Mos 04/27/2018   Influenza-Unspecified 04/22/2011, 04/04/2012, 05/03/2013, 05/04/2013, 04/17/2014, 04/19/2015, 04/26/2016, 04/22/2017, 05/20/2021  Moderna SARS-COV2 Booster Vaccination 06/09/2020, 12/31/2020   Moderna Sars-Covid-2 Vaccination 07/30/2019, 08/27/2019, 01/06/2022   PFIZER(Purple Top)SARS-COV-2 Vaccination 06/01/2022   Pfizer Covid-19 Vaccine Bivalent Booster 64yr & up 04/15/2021   Pneumococcal Polysaccharide-23 08/26/2003, 09/06/2011, 05/15/2019   Pneumococcal-Unspecified 12/20/2013   Td 09/26/2006   Tdap 05/15/2019   Zoster Recombinat (Shingrix) 11/21/2017, 04/08/2018   Zoster, Live 10/25/2007   Pertinent  Health Maintenance Due  Topic Date Due   INFLUENZA VACCINE  Completed   DEXA SCAN  Completed      03/31/2021    7:47 AM 03/31/2021   10:00 PM 04/04/2021    6:13 PM 09/18/2021   12:48 PM 07/07/2022   10:23 AM  Fall Risk  Falls in the past year?    1 1  Was there an injury with Fall?    1 1  Fall Risk Category Calculator    3 3  Fall Risk Category    High High  Patient Fall Risk Level _0   Patient at Risk for Falls Due to    History of fall(s);Impaired balance/gait;Impaired mobility;Orthopedic patient History of fall(s);Impaired balance/gait;Impaired mobility;Orthopedic patient  Fall risk Follow up    Falls  evaluation completed;Education provided;Falls prevention discussed Falls evaluation completed   Functional Status Survey:    Vitals:   07/07/22 1016  BP: 130/71  Pulse: 69  Resp: 13  Temp: (!) 96.4 F (35.8 C)  SpO2: 92%  Weight: 166 lb 3 oz (75.4 kg)  Height: _1  (1.626 m)   Body mass index is 28.53 kg/m. Physical Exam  Labs reviewed: Recent Labs    10/02/21 0000 10/08/21 0000 05/31/22 0850  NA 137 137 139  K 4.0 4.4 3.7  CL 102 99 107  CO2 28* 36* 28*  BUN _2 CREATININE 1.0 0.9 1.2*  CALCIUM 8.9 8.9 9.1   Recent Labs    09/24/21 0000 10/08/21 0000 05/31/22 0850  AST _3 ALT _4 ALKPHOS 86 81 92  ALBUMIN 3.5 3.2* 3.7   Recent Labs    09/24/21 0000 10/02/21 0000 10/08/21 0000 10/30/21 0000 05/31/22 0850  WBC 12.6 10.8 11.0 5.6 5.7  NEUTROABS 10,206.00 9,050.00  --  4,256.00  --   HGB 8.3* 7.8* 8.0* 8.4* 7.9*  HCT 28* 26* 26* 28* 26*  PLT 21* 181 122* 104* 96*   Lab Results  Component Value Date   TSH 3.25 05/31/2022   No results found for: "HGBA1C" Lab Results  Component Value Date   CHOL 125 10/30/2021   HDL 38 10/30/2021   LDLCALC 67 10/30/2021   TRIG 123 10/30/2021   CHOLHDL 3.8 11/23/2018    Significant Diagnostic Results in last 30 days:  No results found.  Assessment/Plan 1. Cognitive impairment ***  2. Essential hypertension ***  3. Mixed hyperlipidemia ***  4. Diastolic dysfunction ***  5. Pancytopenia ([facility]) ***  6. Acquired hypothyroidism ***  7. Chronic bilateral low back pain without sciatica ***  8. Gastroesophageal reflux disease, unspecified whether esophagitis present ***  9. Depression with anxiety ***    Family/ staff Communication: ***  Labs/tests ordered:  ***

## 2022-07-08 DIAGNOSIS — D649 Anemia, unspecified: Secondary | ICD-10-CM | POA: Diagnosis not present

## 2022-07-09 DIAGNOSIS — M79672 Pain in left foot: Secondary | ICD-10-CM | POA: Diagnosis not present

## 2022-07-09 DIAGNOSIS — M79671 Pain in right foot: Secondary | ICD-10-CM | POA: Diagnosis not present

## 2022-07-09 DIAGNOSIS — L84 Corns and callosities: Secondary | ICD-10-CM | POA: Diagnosis not present

## 2022-07-09 DIAGNOSIS — B351 Tinea unguium: Secondary | ICD-10-CM | POA: Diagnosis not present

## 2022-07-13 DIAGNOSIS — M6281 Muscle weakness (generalized): Secondary | ICD-10-CM | POA: Diagnosis not present

## 2022-07-13 DIAGNOSIS — R2681 Unsteadiness on feet: Secondary | ICD-10-CM | POA: Diagnosis not present

## 2022-07-13 DIAGNOSIS — M545 Low back pain, unspecified: Secondary | ICD-10-CM | POA: Diagnosis not present

## 2022-07-13 DIAGNOSIS — Z9181 History of falling: Secondary | ICD-10-CM | POA: Diagnosis not present

## 2022-07-14 DIAGNOSIS — M545 Low back pain, unspecified: Secondary | ICD-10-CM | POA: Diagnosis not present

## 2022-07-14 DIAGNOSIS — M6281 Muscle weakness (generalized): Secondary | ICD-10-CM | POA: Diagnosis not present

## 2022-07-14 DIAGNOSIS — Z9181 History of falling: Secondary | ICD-10-CM | POA: Diagnosis not present

## 2022-07-14 DIAGNOSIS — R2681 Unsteadiness on feet: Secondary | ICD-10-CM | POA: Diagnosis not present

## 2022-08-05 ENCOUNTER — Non-Acute Institutional Stay (SKILLED_NURSING_FACILITY): Payer: Medicare Other | Admitting: Internal Medicine

## 2022-08-05 ENCOUNTER — Encounter: Payer: Self-pay | Admitting: Internal Medicine

## 2022-08-05 DIAGNOSIS — G8929 Other chronic pain: Secondary | ICD-10-CM | POA: Diagnosis not present

## 2022-08-05 DIAGNOSIS — D61818 Other pancytopenia: Secondary | ICD-10-CM

## 2022-08-05 DIAGNOSIS — M545 Low back pain, unspecified: Secondary | ICD-10-CM

## 2022-08-05 DIAGNOSIS — K219 Gastro-esophageal reflux disease without esophagitis: Secondary | ICD-10-CM

## 2022-08-05 DIAGNOSIS — F418 Other specified anxiety disorders: Secondary | ICD-10-CM

## 2022-08-05 DIAGNOSIS — R4189 Other symptoms and signs involving cognitive functions and awareness: Secondary | ICD-10-CM

## 2022-08-05 DIAGNOSIS — E039 Hypothyroidism, unspecified: Secondary | ICD-10-CM | POA: Diagnosis not present

## 2022-08-05 DIAGNOSIS — E782 Mixed hyperlipidemia: Secondary | ICD-10-CM

## 2022-08-05 DIAGNOSIS — R2681 Unsteadiness on feet: Secondary | ICD-10-CM | POA: Diagnosis not present

## 2022-08-05 DIAGNOSIS — I5189 Other ill-defined heart diseases: Secondary | ICD-10-CM

## 2022-08-05 DIAGNOSIS — F339 Major depressive disorder, recurrent, unspecified: Secondary | ICD-10-CM

## 2022-08-05 DIAGNOSIS — I10 Essential (primary) hypertension: Secondary | ICD-10-CM

## 2022-08-05 NOTE — Progress Notes (Signed)
Location:  [facility] Room Number: [place] of Service:  SNF [phone]) Provider:  Virgie Dad, MD   Virgie Dad, MD  Patient Care Team: Virgie Dad, MD as PCP - General (Internal Medicine) Mast, Man X, NP as Nurse Practitioner (Internal Medicine)  Extended Emergency Contact Information Primary Emergency Contact: Varelas,[name] Address: [address]          [place], [place] [address] [place] Phone: [phone] Mobile Phone: [phone] Relation: Son Secondary Emergency Contact: Wheatley, Kathi Der [place] Phone: [phone] Mobile Phone: [phone] Relation: Other  Code Status:  DNR Goals of care: Advanced Directive information    08/05/2022   10:35 AM  Advanced Directives  Does Patient Have a Medical Advance Directive? Yes  Type of Paramedic of [place];Out of facility DNR (pink MOST or yellow form)  Does patient want to make changes to medical advance directive? No - Patient declined  Copy of [place] in Chart? Yes - validated most recent copy scanned in chart (See row information)     Chief Complaint  Patient presents with   Medical Management of Chronic Issues    Patient is being seen for a routine visit   Quality Metric Gaps    Discussed the need for AWv    HPI:  Pt is a 89 y.o. female seen today for medical management of chronic diseases.   Lives in SNF  Patient has h/o Pancytopenia , h/o Chronic Back Pain, Hypertension, Hypothyroidism, Hypertension, Recurrent Cystitis, Osteoporosis and Major Depression   Right Trimalleolar Fracture. S/P ORIF in 09/22 Since then Wheelchair dependent  Recent Cognitive decline   She is stable. No new Nursing issues. No Behavior issues Her weight is stable Does not walk anymore Stays in her wheelchair Needs help with her transfers Mild Assist No Falls Wt Readings from Last 3 Encounters:  08/05/22 155 lb 1.6 oz  (70.4 kg)  07/07/22 166 lb 3 oz (75.4 kg)  06/14/22 156 lb 3.2 oz (70.9 kg)    Past Medical History:  Diagnosis Date   Anemia    Anxiety    Chronotropic incompetence 12/2012    potentially medication related; noted on CPET    DDD (degenerative disc disease)    Eczema    GERD (gastroesophageal reflux disease)    H/O hiatal hernia    Hx: UTI (urinary tract infection)    Hyperlipidemia    Hypertension    Hypothyroidism    Osteopenia    Septicemia ([facility]) 2002   following UTI   Vertigo, benign positional    Past Surgical History:  Procedure Laterality Date   BREAST SURGERY     left biopsy   CATARACT EXTRACTION Right    2 weeks ago   CPET / MET - PFTS     Consistent with chronotropic incompetence; See attached report in the results section   DILATION AND CURETTAGE OF UTERUS     x 2   DOPPLER ECHOCARDIOGRAPHY  01/10/2013   Normal LV size and function. Normal EF. Air sclerosis but no stenosis   ESOPHAGOGASTRODUODENOSCOPY  05/04/2012   Procedure: ESOPHAGOGASTRODUODENOSCOPY (EGD);  Surgeon: Inda Castle, MD;  Location: [facility];  Service: Endoscopy;  Laterality: N/A;   EYE SURGERY     cataract extraction with ILO  ? eye   IR KYPHO EA ADDL LEVEL THORACIC OR LUMBAR  10/28/2016   IR KYPHO LUMBAR INC FX REDUCE BONE BX UNI/BIL CANNULATION  INC/IMAGING  10/28/2016   IR KYPHO THORACIC WITH BONE BIOPSY  12/24/2016   IR RADIOLOGIST EVAL & MGMT  11/11/2016   KNEE ARTHROSCOPY  2011   right   ORIF ANKLE FRACTURE Right 03/27/2021   Procedure: OPEN REDUCTION INTERNAL FIXATION (ORIF) ANKLE FRACTURE;  Surgeon: Teryl Lucy, MD;  Location: [facility];  Service: Orthopedics;  Laterality: Right;   TONSILLECTOMY     TOTAL KNEE ARTHROPLASTY  04/24/2012   Procedure: TOTAL KNEE ARTHROPLASTY;  Surgeon: Loanne Drilling, MD;  Location: [facility];  Service: Orthopedics;  Laterality: Right;   TRANSTHORACIC ECHOCARDIOGRAM  02/2018   Ordered by PCP for "congestive heart failure " -EF 60 M 65%.  GR 1 DD.  No R  WMA--- > ESSENTIALLY NORMAL    No Known Allergies  Outpatient Encounter Medications as of 08/05/2022  Medication Sig   acetaminophen (TYLENOL) 500 MG tablet Take 1,000 mg by mouth every 8 (eight) hours. For pain   atorvastatin (LIPITOR) 10 MG tablet Take 5 mg by mouth at bedtime.   buPROPion (WELLBUTRIN SR) 150 MG 12 hr tablet Take 1 tablet (150 mg total) by mouth 2 (two) times daily after a meal.   calcium citrate-vitamin D (CITRACAL+D) 315-200 MG-UNIT tablet Take 1 tablet by mouth 2 (two) times daily.   Carboxymethylcellulose Sodium (ARTIFICIAL TEARS OP) Place 1 drop into both eyes in the morning, at noon, and at bedtime.   diclofenac Sodium (VOLTAREN) 1 % GEL Apply 4 g topically in the morning and at bedtime. Apply to bilateral ankles BID   ferrous sulfate 325 (65 FE) MG EC tablet Take 325 mg by mouth every Monday, Wednesday, and Friday. With a meal   gabapentin (NEURONTIN) 100 MG capsule Take 100 mg by mouth 2 (two) times daily.   gabapentin (NEURONTIN) 100 MG capsule Take 200 mg by mouth at bedtime.   hydrocortisone cream 1 % Apply 1 application topically daily as needed for itching.   levothyroxine (SYNTHROID) 75 MCG tablet Take 75 mcg by mouth every morning.   losartan (COZAAR) 25 MG tablet Take 25 mg by mouth every morning.   Menthol, Topical Analgesic, (BIOFREEZE) 4 % GEL Apply topically 2 (two) times daily as needed. Apply thin layer to left thigh   nystatin (MYCOSTATIN/NYSTOP) powder Apply 1 application  topically as needed (yeast).   nystatin cream (MYCOSTATIN) Apply 1 Application topically as needed.   omega-3 fish oil (MAXEPA) 1000 MG CAPS capsule Take 1 capsule by mouth daily. EVERY MORNING *TAKE WITH FOOD*   omeprazole (PRILOSEC) 40 MG capsule Take 40 mg by mouth every morning.   OxyCODONE HCl, Abuse Deter, (OXAYDO) 5 MG TABA Take 2.5 mg by mouth as needed. Give 30 minutes before transportation to ortho appointments.   polyethylene glycol (MIRALAX / GLYCOLAX) 17 g packet Take  17 g by mouth every other day.   potassium chloride SA (KLOR-CON M) 20 MEQ tablet Take 20 mEq by mouth as directed. Monday, Tuesday, Wednesday, Thursday, and Friday.   senna (SENOKOT) 8.6 MG TABS tablet Take 2 tablets by mouth at bedtime.   sertraline (ZOLOFT) 25 MG tablet Take 75 mg by mouth every morning.   torsemide (DEMADEX) 20 MG tablet Take 20 mg by mouth as directed. Once A Day on Mon, Tue, Wed, Thu, Fri   zinc oxide 20 % ointment Apply 1 application topically See admin instructions. Apply topically to per/buttocks after every incontinent episode and as needed for redness   ALPRAZolam (XANAX) 0.25 MG tablet Take 1 tablet (0.25 mg  total) by mouth 2 (two) times daily as needed for anxiety. Don't give unless very anxious   Camphor-Menthol-Methyl Sal 3.07-31-08 % PTCH Place 1 patch onto the skin See admin instructions. Apply one patch onto the skin of the  lower back daily for back pain   No facility-administered encounter medications on file as of 08/05/2022.    Review of Systems  Constitutional:  Negative for activity change and appetite change.  HENT: Negative.    Respiratory:  Negative for cough and shortness of breath.   Cardiovascular:  Negative for leg swelling.  Gastrointestinal:  Negative for constipation.  Genitourinary: Negative.   Musculoskeletal:  Positive for back pain and gait problem. Negative for arthralgias and myalgias.  Skin: Negative.   Neurological:  Positive for weakness. Negative for dizziness.  Psychiatric/Behavioral:  Negative for confusion, dysphoric mood and sleep disturbance.     Immunization History  Administered Date(s) Administered   Fluad Quad(high Dose 65+) 05/19/2022   Influenza, High Dose Seasonal PF 04/26/2016, 03/22/2017, 05/08/2019   Influenza,inj,Quad PF,6+ Mos 04/27/2018   Influenza-Unspecified 04/22/2011, 04/04/2012, 05/03/2013, 05/04/2013, 04/17/2014, 04/19/2015, 04/26/2016, 04/22/2017, 05/20/2021   Moderna SARS-COV2 Booster Vaccination  06/09/2020, 12/31/2020   Moderna Sars-Covid-2 Vaccination 07/30/2019, 08/27/2019, 01/06/2022   PFIZER(Purple Top)SARS-COV-2 Vaccination 06/01/2022   Pfizer Covid-19 Vaccine Bivalent Booster 88yrs & up 04/15/2021   Pneumococcal Polysaccharide-23 08/26/2003, 09/06/2011, 05/15/2019   Pneumococcal-Unspecified 12/20/2013   Td 09/26/2006   Tdap 05/15/2019   Zoster Recombinat (Shingrix) 11/21/2017, 04/08/2018   Zoster, Live 10/25/2007   Pertinent  Health Maintenance Due  Topic Date Due   INFLUENZA VACCINE  Completed   DEXA SCAN  Completed      03/31/2021    7:47 AM 03/31/2021   10:00 PM 04/04/2021    6:13 PM 09/18/2021   12:48 PM 07/07/2022   10:23 AM  Fall Risk  Falls in the past year?    1 1  Was there an injury with Fall?    1 1  Fall Risk Category Calculator    3 3  Fall Risk Category    High High  Patient Fall Risk Level High fall risk High fall risk High fall risk High fall risk High fall risk  Patient at Risk for Falls Due to    History of fall(s);Impaired balance/gait;Impaired mobility;Orthopedic patient History of fall(s);Impaired balance/gait;Impaired mobility;Orthopedic patient  Fall risk Follow up    Falls evaluation completed;Education provided;Falls prevention discussed Falls evaluation completed   Functional Status Survey:    Vitals:   08/05/22 1015  BP: 129/67  Pulse: 61  Resp: 18  Temp: (!) 96.6 F (35.9 C)  TempSrc: Temporal  SpO2: 90%  Weight: 155 lb 1.6 oz (70.4 kg)  Height: 5\' 4"  (1.626 m)   Body mass index is 26.62 kg/m. Physical Exam Vitals reviewed.  Constitutional:      Appearance: Normal appearance.  HENT:     Head: Normocephalic.     Nose: Nose normal.     Mouth/Throat:     Mouth: Mucous membranes are moist.     Pharynx: Oropharynx is clear.  Eyes:     Pupils: Pupils are equal, round, and reactive to light.  Cardiovascular:     Rate and Rhythm: Normal rate and regular rhythm.     Pulses: Normal pulses.     Heart sounds: Normal heart  sounds. No murmur heard. Pulmonary:     Effort: Pulmonary effort is normal.     Breath sounds: Normal breath sounds.  Abdominal:     General: Abdomen  is flat. Bowel sounds are normal.     Palpations: Abdomen is soft.  Musculoskeletal:        General: No swelling.     Cervical back: Neck supple.  Skin:    General: Skin is warm.  Neurological:     General: No focal deficit present.     Mental Status: She is alert.  Psychiatric:        Mood and Affect: Mood normal.        Thought Content: Thought content normal.     Labs reviewed: Recent Labs    10/02/21 0000 10/08/21 0000 05/31/22 0850  NA 137 137 139  K 4.0 4.4 3.7  CL 102 99 107  CO2 28* 36* 28*  BUN 19 16 17   CREATININE 1.0 0.9 1.2*  CALCIUM 8.9 8.9 9.1   Recent Labs    09/24/21 0000 10/08/21 0000 05/31/22 0850  AST 18 16 18   ALT 10 7 16   ALKPHOS 86 81 92  ALBUMIN 3.5 3.2* 3.7   Recent Labs    09/24/21 0000 10/02/21 0000 10/08/21 0000 10/30/21 0000 05/31/22 0850  WBC 12.6 10.8 11.0 5.6 5.7  NEUTROABS 10,206.00 9,050.00  --  4,256.00  --   HGB 8.3* 7.8* 8.0* 8.4* 7.9*  HCT 28* 26* 26* 28* 26*  PLT 21* 181 122* 104* 96*   Lab Results  Component Value Date   TSH 3.25 05/31/2022   No results found for: "HGBA1C" Lab Results  Component Value Date   CHOL 125 10/30/2021   HDL 38 10/30/2021   LDLCALC 67 10/30/2021   TRIG 123 10/30/2021   CHOLHDL 3.8 11/23/2018    Significant Diagnostic Results in last 30 days:  No results found.  Assessment/Plan 1. Essential hypertension Losartan  2. Cognitive impairment Some worsening Continue to give her support  3. Diastolic dysfunction Low dose of Torsemide  4. Pancytopenia (HCC) No More work up per Hematology On Iron   5. Acquired hypothyroidism TSH normal in 11/23  6. Mixed hyperlipidemia On statin LDL 67 in 4/23   7. Chronic bilateral low back pain without sciatica Oxycodone prn and Gabapentin  8. Unstable gait Wheelchair  dependent  9. Gastroesophageal reflux disease, unspecified whether esophagitis present Prilosec  10. Depression with anxiety Zoloft and Wellbutrin Also Xanax prn     Family/ staff Communication:   Labs/tests ordered:

## 2022-08-11 ENCOUNTER — Encounter: Payer: Self-pay | Admitting: Orthopedic Surgery

## 2022-08-11 ENCOUNTER — Non-Acute Institutional Stay (SKILLED_NURSING_FACILITY): Payer: Medicare Other | Admitting: Orthopedic Surgery

## 2022-08-11 DIAGNOSIS — R4189 Other symptoms and signs involving cognitive functions and awareness: Secondary | ICD-10-CM | POA: Diagnosis not present

## 2022-08-11 DIAGNOSIS — R350 Frequency of micturition: Secondary | ICD-10-CM

## 2022-08-11 NOTE — Progress Notes (Signed)
Location:  [facility] Room Number: 35A Place of Service:  SNF (31) Provider: Hazle Nordmann, NP   Patient Care Team: Mahlon Gammon, MD as PCP - General (Internal Medicine) Mast, Man X, NP as Nurse Practitioner (Internal Medicine)  Extended Emergency Contact Information Primary Emergency Contact: Currie,[name] Address: [address]          [place], [place] [address] [place] Home Phone: [phone] Mobile Phone: [phone] Relation: Son Secondary Emergency Contact: Dawes, Cindra Eves [place] Home Phone: [phone] Mobile Phone: [phone] Relation: Other  Code Status:  DNR Goals of care: Advanced Directive information    08/11/2022    4:01 PM  Advanced Directives  Does Patient Have a Medical Advance Directive? Yes  Type of Estate agent of [place];Out of facility DNR (pink MOST or yellow form)  Does patient want to make changes to medical advance directive? No - Patient declined  Copy of Healthcare Power of Attorney in Chart? Yes - validated most recent copy scanned in chart (See row information)     Chief Complaint  Patient presents with   Acute Visit    dysuria    HPI:  Pt is a 89 y.o. female seen today for acute visit due to urinary frequency.   She currently resides on the skilled nursing unit at [facility]. PMH: HTN, HLD, CKD, pancytopenia, thrombocytopenia, PVD, GERD, constipation, hypothyroidism, breast surgery, chronic back pain, osteoporosis, right trimalleolar fracture s/p ORIF 03/2021, depression, anxiety and unstable gait.   Nursing reports she did not sleep last night due to urinary frequency. Urine described as tea colored with mild odor.This morning she is observed wondering halls asking for her baby. H/o cognitive impairment and recurrent cystitis. Incontinent of urine and bowels at times. She denies cva tenderness. Afebrile. Vitals stable.     She currently resides on the skilled  nursing unit at [facility]. Past medical history includes: HTN, PVD, constipation, GERD, hypothyroidism, OA, osteoporosis, CKD3, cystitis, pancytopenia, depression/anxiety, weakness  01/2022 diagnosed with acute cystitis- > 100,000 cfu/ML klebsiella pneumoniae 02/16/2022, resolved with Keflex 500 mg TID x 5 days.   Recent blood pressures:  Past Medical History:  Diagnosis Date   Anemia    Anxiety    Chronotropic incompetence 12/2012    potentially medication related; noted on CPET    DDD (degenerative disc disease)    Eczema    GERD (gastroesophageal reflux disease)    H/O hiatal hernia    Hx: UTI (urinary tract infection)    Hyperlipidemia    Hypertension    Hypothyroidism    Osteopenia    Septicemia ([facility]) 2002   following UTI   Vertigo, benign positional    Past Surgical History:  Procedure Laterality Date   BREAST SURGERY     left biopsy   CATARACT EXTRACTION Right    2 weeks ago   CPET / MET - PFTS     Consistent with chronotropic incompetence; See attached report in the results section   DILATION AND CURETTAGE OF UTERUS     x 2   DOPPLER ECHOCARDIOGRAPHY  01/10/2013   Normal LV size and function. Normal EF. Air sclerosis but no stenosis   ESOPHAGOGASTRODUODENOSCOPY  05/04/2012   Procedure: ESOPHAGOGASTRODUODENOSCOPY (EGD);  Surgeon: Louis Meckel, MD;  Location: [facility];  Service: Endoscopy;  Laterality: N/A;   EYE SURGERY     cataract extraction with ILO  ? eye   IR KYPHO EA ADDL LEVEL  THORACIC OR LUMBAR  10/28/2016   IR KYPHO LUMBAR INC FX REDUCE BONE BX UNI/BIL CANNULATION INC/IMAGING  10/28/2016   IR KYPHO THORACIC WITH BONE BIOPSY  12/24/2016   IR RADIOLOGIST EVAL & MGMT  11/11/2016   KNEE ARTHROSCOPY  2011   right   ORIF ANKLE FRACTURE Right 03/27/2021   Procedure: OPEN REDUCTION INTERNAL FIXATION (ORIF) ANKLE FRACTURE;  Surgeon: Marchia Bond, MD;  Location: [facility];  Service: Orthopedics;  Laterality: Right;   TONSILLECTOMY     TOTAL KNEE ARTHROPLASTY   04/24/2012   Procedure: TOTAL KNEE ARTHROPLASTY;  Surgeon: Gearlean Alf, MD;  Location: [facility];  Service: Orthopedics;  Laterality: Right;   TRANSTHORACIC ECHOCARDIOGRAM  02/2018   Ordered by PCP for "congestive heart failure " -EF 60 M 65%.  GR 1 DD.  No R WMA--- > ESSENTIALLY NORMAL    No Known Allergies  Outpatient Encounter Medications as of 08/11/2022  Medication Sig   acetaminophen (TYLENOL) 500 MG tablet Take 1,000 mg by mouth every 8 (eight) hours. For pain   ALPRAZolam (XANAX) 0.25 MG tablet Take 1 tablet (0.25 mg total) by mouth 2 (two) times daily as needed for anxiety. Don't give unless very anxious   atorvastatin (LIPITOR) 10 MG tablet Take 5 mg by mouth at bedtime.   buPROPion (WELLBUTRIN SR) 150 MG 12 hr tablet Take 1 tablet (150 mg total) by mouth 2 (two) times daily after a meal.   calcium citrate-vitamin D (CITRACAL+D) 315-200 MG-UNIT tablet Take 1 tablet by mouth 2 (two) times daily.   Camphor-Menthol-Methyl Sal 3.07-31-08 % PTCH Place 1 patch onto the skin See admin instructions. Apply one patch onto the skin of the  lower back daily for back pain   Carboxymethylcellulose Sodium (ARTIFICIAL TEARS OP) Place 1 drop into both eyes in the morning, at noon, and at bedtime.   diclofenac Sodium (VOLTAREN) 1 % GEL Apply 4 g topically in the morning and at bedtime. Apply to bilateral ankles BID   ferrous sulfate 325 (65 FE) MG EC tablet Take 325 mg by mouth every Monday, Wednesday, and Friday. With a meal   gabapentin (NEURONTIN) 100 MG capsule Take 100 mg by mouth 2 (two) times daily.   gabapentin (NEURONTIN) 100 MG capsule Take 200 mg by mouth at bedtime.   hydrocortisone cream 1 % Apply 1 application topically daily as needed for itching.   levothyroxine (SYNTHROID) 75 MCG tablet Take 75 mcg by mouth every morning.   losartan (COZAAR) 25 MG tablet Take 25 mg by mouth every morning.   Menthol, Topical Analgesic, (BIOFREEZE) 4 % GEL Apply topically 2 (two) times daily as needed.  Apply thin layer to left thigh   nystatin (MYCOSTATIN/NYSTOP) powder Apply 1 application  topically as needed (yeast).   nystatin cream (MYCOSTATIN) Apply 1 Application topically as needed.   omega-3 fish oil (MAXEPA) 1000 MG CAPS capsule Take 1 capsule by mouth daily. EVERY MORNING *TAKE WITH FOOD*   omeprazole (PRILOSEC) 40 MG capsule Take 40 mg by mouth every morning.   OxyCODONE HCl, Abuse Deter, (OXAYDO) 5 MG TABA Take 2.5 mg by mouth as needed. Give 30 minutes before transportation to ortho appointments.   polyethylene glycol (MIRALAX / GLYCOLAX) 17 g packet Take 17 g by mouth every other day.   potassium chloride SA (KLOR-CON M) 20 MEQ tablet Take 20 mEq by mouth as directed. Monday, Tuesday, Wednesday, Thursday, and Friday.   senna (SENOKOT) 8.6 MG TABS tablet Take 2 tablets by mouth at bedtime.  sertraline (ZOLOFT) 25 MG tablet Take 75 mg by mouth every morning.   torsemide (DEMADEX) 20 MG tablet Take 20 mg by mouth as directed. Once A Day on Mon, Tue, Wed, Thu, Fri   zinc oxide 20 % ointment Apply 1 application topically See admin instructions. Apply topically to per/buttocks after every incontinent episode and as needed for redness   No facility-administered encounter medications on file as of 08/11/2022.    Review of Systems  Unable to perform ROS: Dementia    Immunization History  Administered Date(s) Administered   Fluad Quad(high Dose 65+) 05/19/2022   Influenza, High Dose Seasonal PF 04/26/2016, 03/22/2017, 05/08/2019   Influenza,inj,Quad PF,6+ Mos 04/27/2018   Influenza-Unspecified 04/22/2011, 04/04/2012, 05/03/2013, 05/04/2013, 04/17/2014, 04/19/2015, 04/26/2016, 04/22/2017, 05/20/2021   Moderna SARS-COV2 Booster Vaccination 06/09/2020, 12/31/2020   Moderna Sars-Covid-2 Vaccination 07/30/2019, 08/27/2019, 01/06/2022   PFIZER(Purple Top)SARS-COV-2 Vaccination 06/01/2022   Pfizer Covid-19 Vaccine Bivalent Booster 28yrs & up 04/15/2021   Pneumococcal Polysaccharide-23  08/26/2003, 09/06/2011, 05/15/2019   Pneumococcal-Unspecified 12/20/2013   Td 09/26/2006   Tdap 05/15/2019   Zoster Recombinat (Shingrix) 11/21/2017, 04/08/2018   Zoster, Live 10/25/2007   Pertinent  Health Maintenance Due  Topic Date Due   INFLUENZA VACCINE  Completed   DEXA SCAN  Completed      03/31/2021   10:00 PM 04/04/2021    6:13 PM 09/18/2021   12:48 PM 07/07/2022   10:23 AM 08/11/2022    4:00 PM  Fall Risk  Falls in the past year?   1 1 0  Was there an injury with Fall?   1 1 0  Fall Risk Category Calculator   3 3 0  Fall Risk Category (Retired)   [employer]   (RETIRED) Patient Fall Risk Level High fall risk High fall risk High fall risk High fall risk   Patient at Risk for Falls Due to   History of fall(s);Impaired balance/gait;Impaired mobility;Orthopedic patient History of fall(s);Impaired balance/gait;Impaired mobility;Orthopedic patient History of fall(s);Impaired balance/gait;Impaired mobility;Orthopedic patient  Fall risk Follow up   Falls evaluation completed;Education provided;Falls prevention discussed Falls evaluation completed Falls evaluation completed   Functional Status Survey:    Vitals:   08/11/22 1602  BP: 127/61  Pulse: (!) 55  Resp: 14  Temp: (!) 97 F (36.1 C)  SpO2: 91%  Weight: 155 lb 1 oz (70.3 kg)  Height: 5\' 4"  (1.626 m)   Body mass index is 26.62 kg/m. Physical Exam Vitals reviewed.  Constitutional:      General: She is not in acute distress. HENT:     Head: Normocephalic.  Eyes:     General:        Right eye: No discharge.        Left eye: No discharge.  Cardiovascular:     Rate and Rhythm: Normal rate and regular rhythm.     Pulses: Normal pulses.     Heart sounds: Normal heart sounds.  Pulmonary:     Effort: Pulmonary effort is normal. No respiratory distress.     Breath sounds: Normal breath sounds. No wheezing.  Abdominal:     General: Bowel sounds are normal. There is no distension.     Palpations: Abdomen is soft.      Tenderness: There is no abdominal tenderness.  Musculoskeletal:     Cervical back: Neck supple.     Right lower leg: No edema.     Left lower leg: No edema.  Skin:    General: Skin is warm and dry.  Capillary Refill: Capillary refill takes less than 2 seconds.  Neurological:     General: No focal deficit present.     Mental Status: She is alert. Mental status is at baseline.     Motor: Weakness present.     Gait: Gait abnormal.     Comments: wheelchair  Psychiatric:        Mood and Affect: Mood normal.     Comments: Alert to self, disorientated to person, place and time.      Labs reviewed: Recent Labs    10/02/21 0000 10/08/21 0000 05/31/22 0850  NA 137 137 139  K 4.0 4.4 3.7  CL 102 99 107  CO2 28* 36* 28*  BUN 19 16 17   CREATININE 1.0 0.9 1.2*  CALCIUM 8.9 8.9 9.1   Recent Labs    09/24/21 0000 10/08/21 0000 05/31/22 0850  AST 18 16 18   ALT 10 7 16   ALKPHOS 86 81 92  ALBUMIN 3.5 3.2* 3.7   Recent Labs    09/24/21 0000 10/02/21 0000 10/08/21 0000 10/30/21 0000 05/31/22 0850  WBC 12.6 10.8 11.0 5.6 5.7  NEUTROABS 10,206.00 9,050.00  --  4,256.00  --   HGB 8.3* 7.8* 8.0* 8.4* 7.9*  HCT 28* 26* 26* 28* 26*  PLT 21* 181 122* 104* 96*   Lab Results  Component Value Date   TSH 3.25 05/31/2022   No results found for: "HGBA1C" Lab Results  Component Value Date   CHOL 125 10/30/2021   HDL 38 10/30/2021   LDLCALC 67 10/30/2021   TRIG 123 10/30/2021   CHOLHDL 3.8 11/23/2018    Significant Diagnostic Results in last 30 days:  No results found.  Assessment/Plan 1. Urinary frequency - increased confusion x 1 day, urine tea colored with odor - UA/culture - no CVA tenderness, afebrile - encourage fluids   2. Cognitive impairment - see above - BIMS 11/15 (11/23)> was 13/15 (08/15) - CT head noted chronic microvascular ischemia 03/2021 - ambulates with w/c - not on medication - cont skilled nursing care   Family/ staff Communication: plan  discussed with patient and nurse  Labs/tests ordered:  UA/culture

## 2022-08-12 DIAGNOSIS — N390 Urinary tract infection, site not specified: Secondary | ICD-10-CM | POA: Diagnosis not present

## 2022-08-20 ENCOUNTER — Other Ambulatory Visit: Payer: Self-pay | Admitting: Orthopedic Surgery

## 2022-08-20 DIAGNOSIS — F418 Other specified anxiety disorders: Secondary | ICD-10-CM

## 2022-08-20 MED ORDER — ALPRAZOLAM 0.25 MG PO TABS: 0.2500 mg | ORAL_TABLET | Freq: Two times a day (BID) | ORAL | 0 refills | Status: DC | PRN

## 2022-09-15 ENCOUNTER — Encounter: Payer: Self-pay | Admitting: Orthopedic Surgery

## 2022-09-15 ENCOUNTER — Non-Acute Institutional Stay (SKILLED_NURSING_FACILITY): Payer: Medicare Other | Admitting: Orthopedic Surgery

## 2022-09-15 DIAGNOSIS — E039 Hypothyroidism, unspecified: Secondary | ICD-10-CM

## 2022-09-15 DIAGNOSIS — F418 Other specified anxiety disorders: Secondary | ICD-10-CM

## 2022-09-15 DIAGNOSIS — G8929 Other chronic pain: Secondary | ICD-10-CM | POA: Diagnosis not present

## 2022-09-15 DIAGNOSIS — D61818 Other pancytopenia: Secondary | ICD-10-CM | POA: Diagnosis not present

## 2022-09-15 DIAGNOSIS — F0392 Unspecified dementia, unspecified severity, with psychotic disturbance: Secondary | ICD-10-CM | POA: Diagnosis not present

## 2022-09-15 DIAGNOSIS — R2681 Unsteadiness on feet: Secondary | ICD-10-CM | POA: Diagnosis not present

## 2022-09-15 DIAGNOSIS — M545 Low back pain, unspecified: Secondary | ICD-10-CM | POA: Diagnosis not present

## 2022-09-15 DIAGNOSIS — E782 Mixed hyperlipidemia: Secondary | ICD-10-CM | POA: Diagnosis not present

## 2022-09-15 DIAGNOSIS — I10 Essential (primary) hypertension: Secondary | ICD-10-CM | POA: Diagnosis not present

## 2022-09-15 DIAGNOSIS — K219 Gastro-esophageal reflux disease without esophagitis: Secondary | ICD-10-CM | POA: Diagnosis not present

## 2022-09-15 DIAGNOSIS — I5189 Other ill-defined heart diseases: Secondary | ICD-10-CM

## 2022-09-15 NOTE — Progress Notes (Signed)
Location:   [facility] Room Number: 35-A Place of Service:  SNF [phone]) Provider:  Windell Moulding, NP  PCP: Virgie Dad, MD  Patient Care Team: Virgie Dad, MD as PCP - General (Internal Medicine) Mast, Man X, NP as Nurse Practitioner (Internal Medicine)  Extended Emergency Contact Information Primary Emergency Contact: Emigh,[name] Address: [address]          [place],  [address] [place] Phone: [phone] Mobile Phone: [phone] Relation: Son Secondary Emergency Contact: Figueira, Kathi [place] Phone: [phone] Mobile Phone: [phone] Relation: Other  Code Status:  DNR Goals of care: Advanced Directive information    09/15/2022   10:28 AM  Advanced Directives  Does Patient Have a Medical Advance Directive? Yes  Type of Paramedic of [place];Out of facility DNR (pink MOST or yellow form)  Does patient want to make changes to medical advance directive? No - Patient declined  Copy of [place] in Chart? Yes - validated most recent copy scanned in chart (See row information)     Chief Complaint  Patient presents with   Medical Management of Chronic Issues    Routine Visit.   Health Maintenance    Discuss the need for AWV.   Immunizations    Discuss the need for [employer].    HPI:  Pt is a 89 y.o. female seen today for medical management of chronic diseases.    HTN- BUN/creat 17/1.15 06/01/2022, remains on losartan, see trends below, NAS diet HLD- LDL 67 10/30/2021, remains on Lipitor Diastolic dysfunction- BNP 289 10/08/2021, Echo 03/18 noted normal LV systolic function and mild LV hypertrophy, remains in torsemide prn Dementia- BIMS score 11/15 (11/23)> was 13/15 (08/15), CT head noted chronic microvascular ischemic disease 04/16/2022, periods of increased confusion with hallucinations, remains on xanax prn Anemia/pancytopenia - Hgb 7.9 (11/07)>  was 9.3, platelets 96 (was 104), no further workup, remains on ferrous sulfate Hypothyroidism- TSH 3.25 05/31/2022, remains on levothyroxine Chronic back pain- H/o chronic compression fractures to T11, L1 and L2, remains on tylenol/gabapentin/voltaren gel/oxycodone Unstable gait- ambulates mainly with wheelchair, 1+ assist with walker GERD- hgb 7.9 06/01/2022, remains on Prilosec Depression with anxiety- no mood changes, Na+ 139 06/01/2022, remains on Wellbutrin, Zoloft and xanax prn  No recent falls or injuries.   Recent blood pressures:  02/20- 133/71  02/13- 148/72  02/06- 112/55  Recent weights:  02/01- 155.8 lbs  01/01- 155.1 lbs  12/13- 158.2 lbs    Past Medical History:  Diagnosis Date   Anemia    Anxiety    Chronotropic incompetence 12/2012    potentially medication related; noted on CPET    DDD (degenerative disc disease)    Eczema    GERD (gastroesophageal reflux disease)    H/O hiatal hernia    Hx: UTI (urinary tract infection)    Hyperlipidemia    Hypertension    Hypothyroidism    Osteopenia    Septicemia ([place]) 2002   following UTI   Vertigo, benign positional    Past Surgical History:  Procedure Laterality Date   BREAST SURGERY     left biopsy   CATARACT EXTRACTION Right    2 weeks ago   CPET / MET - PFTS     Consistent with chronotropic incompetence; See attached report in the results section   DILATION AND CURETTAGE OF UTERUS     x 2   DOPPLER ECHOCARDIOGRAPHY  01/10/2013  Normal LV size and function. Normal EF. Air sclerosis but no stenosis   ESOPHAGOGASTRODUODENOSCOPY  05/04/2012   Procedure: ESOPHAGOGASTRODUODENOSCOPY (EGD);  Surgeon: Inda Castle, MD;  Location: [facility];  Service: Endoscopy;  Laterality: N/A;   EYE SURGERY     cataract extraction with ILO  ? eye   IR KYPHO EA ADDL LEVEL THORACIC OR LUMBAR  10/28/2016   IR KYPHO LUMBAR INC FX REDUCE BONE BX UNI/BIL CANNULATION INC/IMAGING  10/28/2016   IR KYPHO THORACIC WITH BONE BIOPSY   12/24/2016   IR RADIOLOGIST EVAL & MGMT  11/11/2016   KNEE ARTHROSCOPY  2011   right   ORIF ANKLE FRACTURE Right 03/27/2021   Procedure: OPEN REDUCTION INTERNAL FIXATION (ORIF) ANKLE FRACTURE;  Surgeon: Marchia Bond, MD;  Location: [facility];  Service: Orthopedics;  Laterality: Right;   TONSILLECTOMY     TOTAL KNEE ARTHROPLASTY  04/24/2012   Procedure: TOTAL KNEE ARTHROPLASTY;  Surgeon: Gearlean Alf, MD;  Location: [facility];  Service: Orthopedics;  Laterality: Right;   TRANSTHORACIC ECHOCARDIOGRAM  02/2018   Ordered by PCP for "congestive heart failure " -EF 60 M 65%.  GR 1 DD.  No R WMA--- > ESSENTIALLY NORMAL    No Known Allergies  Allergies as of 09/15/2022   No Known Allergies      Medication List        Accurate as of September 15, 2022 10:29 AM. If you have any questions, ask your nurse or doctor.          acetaminophen 500 MG tablet Commonly known as: TYLENOL Take 1,000 mg by mouth every 8 (eight) hours. For pain   ALPRAZolam 0.25 MG tablet Commonly known as: XANAX Take 1 tablet (0.25 mg total) by mouth 2 (two) times daily as needed for anxiety. Don't give unless very anxious   ARTIFICIAL TEARS OP Place 1 drop into both eyes in the morning, at noon, and at bedtime.   atorvastatin 10 MG tablet Commonly known as: LIPITOR Take 5 mg by mouth at bedtime.   Biofreeze 4 % Gel Generic drug: Menthol (Topical Analgesic) Apply topically 2 (two) times daily as needed. Apply thin layer to left thigh   buPROPion 150 MG 12 hr tablet Commonly known as: WELLBUTRIN SR Take 1 tablet (150 mg total) by mouth 2 (two) times daily after a meal.   calcium citrate-vitamin D 315-200 MG-UNIT tablet Commonly known as: CITRACAL+D Take 1 tablet by mouth 2 (two) times daily.   Camphor-Menthol-Methyl Sal 3.07-31-08 % Ptch Place 1 patch onto the skin See admin instructions. Apply one patch onto the skin of the  lower back daily for back pain   diclofenac Sodium 1 % Gel Commonly known as:  VOLTAREN Apply 4 g topically in the morning and at bedtime. Apply to bilateral ankles BID   ferrous sulfate 325 (65 FE) MG EC tablet Take 325 mg by mouth every Monday, Wednesday, and Friday. With a meal   gabapentin 100 MG capsule Commonly known as: NEURONTIN Take 100 mg by mouth 2 (two) times daily.   gabapentin 100 MG capsule Commonly known as: NEURONTIN Take 200 mg by mouth at bedtime.   hydrocortisone cream 1 % Apply 1 application topically daily as needed for itching.   levothyroxine 75 MCG tablet Commonly known as: SYNTHROID Take 75 mcg by mouth every morning.   losartan 25 MG tablet Commonly known as: COZAAR Take 25 mg by mouth every morning.   nystatin powder Commonly known as: MYCOSTATIN/NYSTOP Apply 1 application  topically as needed (yeast).   nystatin cream Commonly known as: MYCOSTATIN Apply 1 Application topically as needed.   omega-3 fish oil 1000 MG Caps capsule Commonly known as: MAXEPA Take 1 capsule by mouth daily. EVERY MORNING *TAKE WITH FOOD*   omeprazole 40 MG capsule Commonly known as: PRILOSEC Take 40 mg by mouth every morning.   OxyCODONE HCl (Abuse Deter) 5 MG Taba Commonly known as: OXAYDO Take 2.5 mg by mouth as needed. Give 30 minutes before transportation to ortho appointments.   polyethylene glycol 17 g packet Commonly known as: MIRALAX / GLYCOLAX Take 17 g by mouth every other day.   potassium chloride SA 20 MEQ tablet Commonly known as: KLOR-CON M Take 20 mEq by mouth as directed. Monday, Tuesday, Wednesday, Thursday, and Friday.   senna 8.6 MG Tabs tablet Commonly known as: SENOKOT Take 2 tablets by mouth at bedtime.   sertraline 25 MG tablet Commonly known as: ZOLOFT Take 75 mg by mouth every morning.   torsemide 20 MG tablet Commonly known as: DEMADEX Take 20 mg by mouth as directed. Once A Day on Mon, Tue, Wed, Thu, Fri   zinc oxide 20 % ointment Apply 1 application topically See admin instructions. Apply  topically to per/buttocks after every incontinent episode and as needed for redness        Review of Systems  Constitutional:  Positive for fatigue. Negative for activity change and appetite change.  HENT:  Negative for congestion and trouble swallowing.   Eyes:  Negative for visual disturbance.  Respiratory:  Positive for shortness of breath. Negative for cough and wheezing.   Cardiovascular:  Negative for chest pain and leg swelling.  Gastrointestinal:  Negative for abdominal distention, abdominal pain, blood in stool, constipation, diarrhea, nausea and vomiting.  Genitourinary:  Negative for dysuria, frequency and hematuria.  Musculoskeletal:  Positive for arthralgias, back pain and gait problem.  Skin:  Negative for wound.  Neurological:  Positive for weakness. Negative for dizziness and headaches.  Psychiatric/Behavioral:  Positive for confusion, dysphoric mood and sleep disturbance. The patient is nervous/anxious.     Immunization History  Administered Date(s) Administered   Fluad Quad(high Dose 65+) 05/19/2022   Influenza, High Dose Seasonal PF 04/26/2016, 03/22/2017, 05/08/2019   Influenza,inj,Quad PF,6+ Mos 04/27/2018   Influenza-Unspecified 04/22/2011, 04/04/2012, 05/03/2013, 05/04/2013, 04/17/2014, 04/19/2015, 04/26/2016, 04/22/2017, 05/20/2021   Moderna SARS-COV2 Booster Vaccination 06/09/2020, 12/31/2020   Moderna Sars-Covid-2 Vaccination 07/30/2019, 08/27/2019, 01/06/2022   PFIZER(Purple Top)SARS-COV-2 Vaccination 06/01/2022   Pfizer Covid-19 Vaccine Bivalent Booster 24yr & up 04/15/2021   Pneumococcal Polysaccharide-23 08/26/2003, 09/06/2011, 05/15/2019   Pneumococcal-Unspecified 12/20/2013   Td 09/26/2006   Tdap 05/15/2019   Zoster Recombinat (Shingrix) 11/21/2017, 04/08/2018   Zoster, Live 10/25/2007   Pertinent  Health Maintenance Due  Topic Date Due   INFLUENZA VACCINE  Completed   DEXA SCAN  Completed      03/31/2021   10:00 PM 04/04/2021    6:13 PM  09/18/2021   12:48 PM 07/07/2022   10:23 AM 08/11/2022    4:00 PM  Fall Risk  Falls in the past year?   1 1 0  Was there an injury with Fall?   1 1 0  Fall Risk Category Calculator   3 3 0  Fall Risk Category (Retired)   [employer]  (RETIRED) Patient Fall Risk Level High fall risk High fall risk High fall risk High fall risk   Patient at Risk for Falls Due to   History of fall(s);Impaired balance/gait;Impaired  mobility;Orthopedic patient History of fall(s);Impaired balance/gait;Impaired mobility;Orthopedic patient History of fall(s);Impaired balance/gait;Impaired mobility;Orthopedic patient  Fall risk Follow up   Falls evaluation completed;Education provided;Falls prevention discussed Falls evaluation completed Falls evaluation completed   Functional Status Survey:    Vitals:   09/15/22 1021  BP: 133/71  Pulse: 72  Resp: 14  Temp: (!) 96.2 F (35.7 C)  SpO2: 91%  Weight: 155 lb 1.6 oz (70.4 kg)  Height: 5' 4"$  (1.626 m)   Body mass index is 26.62 kg/m. Physical Exam Vitals reviewed.  Constitutional:      General: She is not in acute distress. HENT:     Head: Normocephalic.     Right Ear: There is no impacted cerumen.     Left Ear: There is no impacted cerumen.     Nose: Nose normal.     Mouth/Throat:     Mouth: Mucous membranes are moist.  Eyes:     General:        Right eye: No discharge.        Left eye: No discharge.  Cardiovascular:     Rate and Rhythm: Normal rate and regular rhythm.     Pulses: Normal pulses.     Heart sounds: Normal heart sounds.  Pulmonary:     Effort: Pulmonary effort is normal. No respiratory distress.     Breath sounds: Normal breath sounds. No wheezing.  Abdominal:     General: Bowel sounds are normal. There is no distension.     Palpations: Abdomen is soft.     Tenderness: There is no abdominal tenderness.  Musculoskeletal:     Cervical back: Neck supple.     Right lower leg: No edema.     Left lower leg: No edema.     Comments:  Right ankle brace  Skin:    General: Skin is warm and dry.     Capillary Refill: Capillary refill takes less than 2 seconds.  Neurological:     General: No focal deficit present.     Mental Status: She is alert. Mental status is at baseline.     Motor: Weakness present.     Gait: Gait abnormal.     Comments: wheelchair  Psychiatric:        Mood and Affect: Mood normal.        Behavior: Behavior normal.     Comments: Very pleasant, follows commands, alert to self/place/person     Labs reviewed: Recent Labs    10/02/21 0000 10/08/21 0000 05/31/22 0850  NA 137 137 139  K 4.0 4.4 3.7  CL 102 99 107  CO2 28* 36* 28*  BUN 19 16 17  $ CREATININE 1.0 0.9 1.2*  CALCIUM 8.9 8.9 9.1   Recent Labs    09/24/21 0000 10/08/21 0000 05/31/22 0850  AST 18 16 18  $ ALT 10 7 16  $ ALKPHOS 86 81 92  ALBUMIN 3.5 3.2* 3.7   Recent Labs    09/24/21 0000 10/02/21 0000 10/08/21 0000 10/30/21 0000 05/31/22 0850  WBC 12.6 10.8 11.0 5.6 5.7  NEUTROABS 10,206.00 9,050.00  --  4,256.00  --   HGB 8.3* 7.8* 8.0* 8.4* 7.9*  HCT 28* 26* 26* 28* 26*  PLT 21* 181 122* 104* 96*   Lab Results  Component Value Date   TSH 3.25 05/31/2022   No results found for: "HGBA1C" Lab Results  Component Value Date   CHOL 125 10/30/2021   HDL 38 10/30/2021   LDLCALC 67 10/30/2021   TRIG 123  10/30/2021   CHOLHDL 3.8 11/23/2018    Significant Diagnostic Results in last 30 days:  No results found.  Assessment/Plan 1. Essential hypertension - controlled with losartan  2. Mixed hyperlipidemia - cont atorvastatin  3. Diastolic dysfunction - compensated - cont torsemide  4. 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  Pancytopenia ([place]) - no further workup per goals of care - cont ferrous sulfate  6. Acquired hypothyroidism - TSH stable - cont levothyroxine  7. Chronic bilateral low back pain without sciatica - ongoing - h/o compression fx to T11/L1/L2 - cont cont tylenol, gabapentin, oxycodone and voltaren gel  8. Unstable gait - no recent falls - cont skilled nursing care  9. Gastroesophageal reflux disease, unspecified whether esophagitis present - cont omeprazole  10. Depression with anxiety - no mood changes - cont Wellbutrin, zoloft and xanax prn    Family/ staff Communication: plan discussed with patient and nurse  Labs/tests ordered: none

## 2022-09-22 ENCOUNTER — Encounter: Payer: Self-pay | Admitting: Orthopedic Surgery

## 2022-09-22 ENCOUNTER — Non-Acute Institutional Stay (INDEPENDENT_AMBULATORY_CARE_PROVIDER_SITE_OTHER): Payer: Medicare Other | Admitting: Orthopedic Surgery

## 2022-09-22 DIAGNOSIS — Z0000 Encounter for general adult medical examination without abnormal findings: Secondary | ICD-10-CM

## 2022-09-22 NOTE — Progress Notes (Signed)
Subjective:   [name] [name] is a 89 y.o. female who presents for Medicare Annual (Subsequent) preventive examination.  Place of Service: [facility]- skilled nursing unit Provider: Windell Moulding, AGNP-C   Review of Systems     Cardiac Risk Factors include: advanced age (>25mn, >>19women);hypertension;sedentary lifestyle     Objective:    Today's Vitals   09/22/22 0944 09/22/22 1223  BP: (!) 125/56   Pulse: (!) 57   Resp: 16   Temp: (!) 96 F (35.6 C)   SpO2: 96%   Weight: 155 lb 1.6 oz (70.4 kg)   Height: '5\' 4"'$  (1.626 m)   PainSc:  0-No pain   Body mass index is 26.62 kg/m.     09/22/2022    9:49 AM 09/15/2022   10:28 AM 08/11/2022    4:01 PM 08/05/2022   10:35 AM 07/07/2022   10:26 AM 05/20/2022    3:12 PM 04/06/2022   11:19 AM  Advanced Directives  Does Patient Have a Medical Advance Directive? Yes Yes Yes Yes Yes Yes Yes  Type of AParamed[place]of AGib[place]on[place] of facility DNR (pink MOST or yellow form) [place] DNR (pink MOST or yellow form) [place]NR (pink MOST or yellow form) H[place] of facility DNR (pink MOST or yellow form) HCaberyOut of facility DNR (pink MOST or yellow form) HBerryOut of facility DNR (pink MOST or yellow form) H[place] of facility DNR (pink MOST or yellow form)  Does patient want to make changes to medical advance directive? No - Patient declined No - Patient declined No - Patient declined No - Patient declined No - Patient declined No - Patient declined No - Patient declined  Copy of HRib [place] Chart? Yes - validated most recent copy scanned in chart (See row information) Yes - validated most recent copy scanned in chart (See row information) Yes - validated most recent copy scanned in chart (See row information) Yes - validated most recent  copy scanned in chart (See row information) Yes - validated most recent copy scanned in chart (See row information) Yes - validated most recent copy scanned in chart (See row information) Yes - validated most recent copy scanned in chart (See row information)    Current Medications (verified) Outpatient Encounter Medications as of 09/22/2022  Medication Sig   acetaminophen (TYLENOL) 500 MG tablet Take 1,000 mg by mouth every 8 (eight) hours. For pain   ALPRAZolam (XANAX) 0.25 MG tablet Take 1 tablet (0.25 mg total) by mouth 2 (two) times daily as needed for anxiety. Don't give unless very anxious   atorvastatin (LIPITOR) 10 MG tablet Take 5 mg by mouth at bedtime.   buPROPion (WELLBUTRIN SR) 150 MG 12 hr tablet Take 1 tablet (150 mg total) by mouth 2 (two) times daily after a meal.   calcium citrate-vitamin D (CITRACAL+D) 315-200 MG-UNIT tablet Take 1 tablet by mouth 2 (two) times daily.   Camphor-Menthol-Methyl Sal 3.07-31-08 % PTCH Place 1 patch onto the skin See admin instructions. Apply one patch onto the skin of the  lower back daily for back pain   Carboxymethylcellulose Sodium (ARTIFICIAL TEARS OP) Place 1 drop into both eyes in the morning, at noon, and at bedtime.   diclofenac Sodium (VOLTAREN) 1 % GEL Apply 4 g topically in the morning and at bedtime. Apply to bilateral ankles BID  ferrous sulfate 325 (65 FE) MG EC tablet Take 325 mg by mouth every Monday, Wednesday, and Friday. With a meal   gabapentin (NEURONTIN) 100 MG capsule Take 100 mg by mouth 2 (two) times daily.   gabapentin (NEURONTIN) 100 MG capsule Take 200 mg by mouth at bedtime.   hydrocortisone cream 1 % Apply 1 application topically daily as needed for itching.   levothyroxine (SYNTHROID) 75 MCG tablet Take 75 mcg by mouth every morning.   losartan (COZAAR) 25 MG tablet Take 25 mg by mouth every morning.   Menthol, Topical Analgesic, (BIOFREEZE) 4 % GEL Apply topically 2 (two) times daily as needed. Apply thin layer to  left thigh   nystatin (MYCOSTATIN/NYSTOP) powder Apply 1 application  topically as needed (yeast).   nystatin cream (MYCOSTATIN) Apply 1 Application topically as needed.   omega-3 fish oil (MAXEPA) 1000 MG CAPS capsule Take 1 capsule by mouth daily. EVERY MORNING *TAKE WITH FOOD*   omeprazole (PRILOSEC) 40 MG capsule Take 40 mg by mouth every morning.   OxyCODONE HCl, Abuse Deter, (OXAYDO) 5 MG TABA Take 2.5 mg by mouth as needed. Give 30 minutes before transportation to ortho appointments.   polyethylene glycol (MIRALAX / GLYCOLAX) 17 g packet Take 17 g by mouth every other day.   potassium chloride SA (KLOR-CON M) 20 MEQ tablet Take 20 mEq by mouth as directed. Monday, Tuesday, Wednesday, Thursday, and Friday.   senna (SENOKOT) 8.6 MG TABS tablet Take 2 tablets by mouth at bedtime.   sertraline (ZOLOFT) 25 MG tablet Take 75 mg by mouth every morning.   torsemide (DEMADEX) 20 MG tablet Take 20 mg by mouth as directed. Once A Day on Mon, Tue, Wed, Thu, Fri   zinc oxide 20 % ointment Apply 1 application topically See admin instructions. Apply topically to per/buttocks after every incontinent episode and as needed for redness   No facility-administered encounter medications on file as of 09/22/2022.    Allergies (verified) Patient has no known allergies.   History: Past Medical History:  Diagnosis Date   Anemia    Anxiety    Chronotropic incompetence 12/2012    potentially medication related; noted on CPET    DDD (degenerative disc disease)    Eczema    GERD (gastroesophageal reflux disease)    H/O hiatal hernia    Hx: UTI (urinary tract infection)    Hyperlipidemia    Hypertension    Hypothyroidism    Osteopenia    Septicemia ([place]) 2002   following UTI   Vertigo, benign positional    Past Surgical History:  Procedure Laterality Date   BREAST SURGERY     left biopsy   CATARACT EXTRACTION Right    2 weeks ago   CPET / MET - PFTS     Consistent with chronotropic incompetence;  See attached report in the results section   DILATION AND CURETTAGE OF UTERUS     x 2   DOPPLER ECHOCARDIOGRAPHY  01/10/2013   Normal LV size and function. Normal EF. Air sclerosis but no stenosis   ESOPHAGOGASTRODUODENOSCOPY  05/04/2012   Procedure: ESOPHAGOGASTRODUODENOSCOPY (EGD);  Surgeon: Inda Castle, MD;  Location: [facility];  Service: Endoscopy;  Laterality: N/A;   EYE SURGERY     cataract extraction with ILO  ? eye   IR KYPHO EA ADDL LEVEL THORACIC OR LUMBAR  10/28/2016   IR KYPHO LUMBAR INC FX REDUCE BONE BX UNI/BIL CANNULATION INC/IMAGING  10/28/2016   IR KYPHO THORACIC WITH BONE BIOPSY  12/24/2016   IR RADIOLOGIST EVAL & MGMT  11/11/2016   KNEE ARTHROSCOPY  2011   right   ORIF ANKLE FRACTURE Right 03/27/2021   Procedure: OPEN REDUCTION INTERNAL FIXATION (ORIF) ANKLE FRACTURE;  Surgeon: Marchia Bond, MD;  Location: [facility];  Service: Orthopedics;  Laterality: Right;   TONSILLECTOMY     TOTAL KNEE ARTHROPLASTY  04/24/2012   Procedure: TOTAL KNEE ARTHROPLASTY;  Surgeon: Gearlean Alf, MD;  Location: [facility];  Service: Orthopedics;  Laterality: Right;   TRANSTHORACIC ECHOCARDIOGRAM  02/2018   Ordered by PCP for "congestive heart failure " -EF 60 M 65%.  GR 1 DD.  No R WMA--- > ESSENTIALLY NORMAL   Family History  Problem Relation Age of Onset   Lung cancer Father 36       Died at age 49   Stroke Mother 79       Died in her late 47s.   Heart attack Mother 15   Diabetes Mother    Arthritis Mother    Social History   Socioeconomic History   Marital status: Widowed    Spouse name: [name]   Number of children: 3   Years of education: Not on file   Highest education level: Not on file  Occupational History   Occupation: homemaker  Tobacco Use   Smoking status: Former    Years: 0.00    Types: Cigarettes    Quit date: 05/03/1965    Years since quitting: 57.4   Smokeless tobacco: Never  Vaping Use   Vaping Use: Never used  Substance and Sexual Activity   Alcohol use:  No    Comment: occ glass wine   Drug use: No   Sexual activity: Not on file  Other Topics Concern   Not on file  Social History Narrative   She is a widowed mother of 3.   She is a former smoker, with a distant history having quit in 1966.   She does drink an occasional glass of red wine.   She is just now started to try doing exercise with water aerobics.   Social Determinants of Health   Financial Resource Strain: Low Risk  (09/22/2022)   Overall Financial Resource Strain (CARDIA)    Difficulty of Paying Living Expenses: Not hard at all  Food Insecurity: No Food Insecurity (09/22/2022)   Hunger Vital Sign    Worried About Running Out of Food in the Last Year: Never true    Ran Out of Food in the Last Year: Never true  Transportation Needs: No Transportation Needs (09/22/2022)   PRAPARE - Hydrologist (Medical): No    Lack of Transportation (Non-Medical): No  Physical Activity: Insufficiently Active (09/22/2022)   Exercise Vital Sign    Days of Exercise per Week: 3 days    Minutes of Exercise per Session: 10 min  Stress: Stress Concern Present (09/22/2022)   [place]    Feeling of Stress : To some extent  Social Connections: Socially Isolated (09/22/2022)   Social Connection and Isolation Panel [NHANES]    Frequency of Communication with Friends and Family: More than three times a week    Frequency of Social Gatherings with Friends and Family: Once a week    Attends Religious Services: Never    Marine scientist or Organizations: No    Attends Archivist Meetings: Never    Marital Status: Widowed    Tobacco Counseling  Counseling given: Not Answered   Clinical Intake:  Pre-visit preparation completed: No  Pain : No/denies pain Pain Score: 0-No pain     BMI - recorded: 26.62 Nutritional Status: BMI 25 -29 Overweight Nutritional Risks: None Diabetes:  No  How often do you need to have someone help you when you read instructions, pamphlets, or other written materials from your doctor or pharmacy?: 3 - Sometimes What is the last grade level you completed in school?: college x 1 year  Diabetic?No  Interpreter Needed?: No      Activities of Daily Living    09/22/2022   12:30 PM  In your present state of health, do you have any difficulty performing the following activities:  Hearing? 0  Vision? 0  Difficulty concentrating or making decisions? 1  Walking or climbing stairs? 1  Dressing or bathing? 0  Doing errands, shopping? 1  Preparing Food and eating ? Y  Using the Toilet? Y  In the past six months, have you accidently leaked urine? Y  Do you have problems with loss of bowel control? Y  Managing your Medications? Y  Managing your Finances? Y  Housekeeping or managing your Housekeeping? Y    Patient Care Team: Virgie Dad, MD as PCP - General (Internal Medicine) Mast, Man X, NP as Nurse Practitioner (Internal Medicine)  Indicate any recent Medical Services you may have received from other than [facility] providers in the past year (date may be approximate).     Assessment:   This is a routine wellness examination for [name] [name].  Hearing/Vision screen No results found.  Dietary issues and exercise activities discussed: Current Exercise Habits: The patient does not participate in regular exercise at present, Exercise limited by: cardiac condition(s);neurologic condition(s);orthopedic condition(s);psychological condition(s)   Goals Addressed             This Visit's Progress    Absence of Fall and Fall-Related Injury   On track    Evidence-based guidance:  Assess fall risk using a validated tool when available. Consider balance and gait impairment, muscle weakness, diminished vision or hearing, environmental hazards, presence of urinary or bowel urgency and/or incontinence.  Communicate fall injury risk to  interprofessional healthcare team.  Develop a fall prevention plan with the patient and family.  Promote use of personal vision and auditory aids.  Promote reorientation, appropriate sensory stimulation, and routines to decrease risk of fall when changes in mental status are present.  Assess assistance level required for safe and effective self-care; consider referral for home care.  Encourage physical activity, such as performance of self-care at highest level of ability, strength and balance exercise program, and provision of appropriate assistive devices; refer to rehabilitation therapy.  Refer to community-based fall prevention program where available.  If fall occurs, determine the cause and revise fall injury prevention plan.  Regularly review medication contribution to fall risk; consider risk related to polypharmacy and age.  Refer to pharmacist for consultation when concerns about medications are revealed.  Balance adequate pain management with potential for oversedation.  Provide guidance related to environmental modifications.  Consider supplementation with Vitamin D.   Notes:      Functional Decline Minimized   On track    Evidence-based guidance:  Assess fall risk, including balance and gait impairment, muscle weakness, diminished vision or hearing, environmental hazards, effects of medication and dehydration.  Assess and review gait speed, decreased muscle strength or impaired functional mobility; consider use of validated tool if available.  Prepare patient for  rehabilitation services to develop an individualized activity and exercise program as indicated, such as progressive resistance strengthening, balance and activity tolerance training or home exercise program.  Prevent falls with environmental adjustment; encourage compliance with exercise program, management of incontinence and adequate vitamin D and calcium intake from food or supplements.  Promote gradual increase in  intensity of activity and exercise as tolerated, such as duration, frequency, exercise repetition and sets and intensity.  Provide guidance and interventions aimed at fall prevention.  Review or assess presence of reduced muscle mass or results of dual-energy x-ray absorptiometry.   Notes:        Depression Screen    09/22/2022   12:30 PM 08/11/2022    4:00 PM 07/07/2022   10:24 AM 09/18/2021   12:39 PM 05/01/2018    1:23 PM 10/28/2017    1:58 PM 09/14/2017    9:14 AM  PHQ 2/9 Scores  PHQ - 2 Score 1 0 0 '1 6 1 4  '$ PHQ- 9 Score     14  14    Fall Risk    09/22/2022   12:30 PM 08/11/2022    4:00 PM 07/07/2022   10:23 AM 09/18/2021   12:48 PM 10/18/2018    4:39 PM  Fall Risk   Falls in the past year? 1 0 1 1 0  Number falls in past yr: 0 0 1 1 0  Injury with Fall? 0 0 1 1 0  Risk for fall due to : History of fall(s);Impaired balance/gait;Impaired mobility History of fall(s);Impaired balance/gait;Impaired mobility;Orthopedic patient History of fall(s);Impaired balance/gait;Impaired mobility;Orthopedic patient History of fall(s);Impaired balance/gait;Impaired mobility;Orthopedic patient   Follow up Falls evaluation completed;Education provided;Falls prevention discussed Falls evaluation completed Falls evaluation completed Falls evaluation completed;Education provided;Falls prevention discussed     FALL RISK PREVENTION PERTAINING TO THE HOME:  Any stairs in or around the home? No  If so, are there any without handrails? No  Home free of loose throw rugs in walkways, pet beds, electrical cords, etc? Yes  Adequate lighting in your home to reduce risk of falls? Yes   ASSISTIVE DEVICES UTILIZED TO PREVENT FALLS:  Life alert? No  Use of a cane, walker or w/c? Yes  Grab bars in the bathroom? Yes  Shower chair or bench in shower? Yes  Elevated toilet seat or a handicapped toilet? Yes   TIMED UP AND GO:  Was the test performed? No .  Length of time to ambulate 10 feet: N/A sec.    Gait slow and steady with assistive device  Cognitive Function:    09/22/2022   12:31 PM 09/18/2021   12:50 PM 09/14/2017    9:01 AM 06/29/2017   11:12 AM  MMSE - Mini Mental State Exam  Not completed:  Refused    Orientation to time '3  5 5  '$ Orientation to Place '5  5 5  '$ Registration '2  3 3  '$ Attention/ Calculation '5  5 5  '$ Recall '1  3 3  '$ Language- name 2 objects '2  2 2  '$ Language- repeat '1  1 1  '$ Language- follow 3 step command '3  3 3  '$ Language- read & follow direction '1  1 1  '$ Write a sentence '1  1 1  '$ Copy design '1  1 1  '$ Total score '25  30 30        '$ 09/18/2021   12:50 PM  6CIT Screen  What Year? 0 points  What month? 0 points  What time? 3 points  Count back from 20 2 points  Months in reverse 2 points  Repeat phrase 0 points  Total Score 7 points    Immunizations Immunization History  Administered Date(s) Administered   Fluad Quad(high Dose 65+) 05/19/2022   Influenza, High Dose Seasonal PF 04/26/2016, 03/22/2017, 05/08/2019   Influenza,inj,Quad PF,6+ Mos 04/27/2018   Influenza-Unspecified 04/22/2011, 04/04/2012, 05/03/2013, 05/04/2013, 04/17/2014, 04/19/2015, 04/26/2016, 04/22/2017, 05/20/2021   Moderna SARS-COV2 Booster Vaccination 06/09/2020, 12/31/2020   Moderna Sars-Covid-2 Vaccination 07/30/2019, 08/27/2019, 01/06/2022   PFIZER(Purple Top)SARS-COV-2 Vaccination 06/01/2022   Pfizer Covid-19 Vaccine Bivalent Booster 78yr & up 04/15/2021   Pneumococcal Polysaccharide-23 08/26/2003, 09/06/2011, 05/15/2019   Pneumococcal-Unspecified 12/20/2013   Td 09/26/2006   Tdap 05/15/2019   Zoster Recombinat (Shingrix) 11/21/2017, 04/08/2018   Zoster, Live 10/25/2007    TDAP status: Up to date  Flu Vaccine status: Up to date  Pneumococcal vaccine status: Up to date  Covid-19 vaccine status: Completed vaccines  Qualifies for Shingles Vaccine? Yes   Zostavax completed Yes   Shingrix Completed?: Yes  Screening Tests Health Maintenance  Topic Date Due   COVID-19  Vaccine (8 - 2023-24 season) 03/25/2023 (Originally 07/27/2022)   Pneumonia Vaccine 89 Years old (2 of 2 - PCV) 08/06/2023 (Originally 05/14/2020)   Medicare Annual Wellness (AWV)  09/23/2023   DTaP/Tdap/Td (3 - Td or Tdap) 05/14/2029   INFLUENZA VACCINE  Completed   DEXA SCAN  Completed   Zoster Vaccines- Shingrix  Completed   HPV VACCINES  Aged Out    Health Maintenance  There are no preventive care reminders to display for this patient.   Colorectal cancer screening: No longer required.   Mammogram status: No longer required due to advanced age.  Bone Density status: Completed 2017. Results reflect: Bone density results: OSTEOPOROSIS. Repeat every 2> refused due to advanced age years.  Lung Cancer Screening: (Low Dose CT Chest recommended if Age 87-80years, 30 pack-year currently smoking OR have quit w/in 15years.) does not qualify.   Lung Cancer Screening Referral: No  Additional Screening:  Hepatitis C Screening: does not qualify; Completed   Vision Screening: Recommended annual ophthalmology exams for early detection of glaucoma and other disorders of the eye. Is the patient up to date with their annual eye exam?  Yes  Who is the provider or what is the name of the office in which the patient attends annual eye exams? In house provider> scheduled for next visit If pt is not established with a provider, would they like to be referred to a provider to establish care? No .   Dental Screening: Recommended annual dental exams for proper oral hygiene  Community Resource Referral / Chronic Care Management: CRR required this visit?  No   CCM required this visit?  No      Plan:     I have personally reviewed and noted the following in the patient's chart:   Medical and social history Use of alcohol, tobacco or illicit drugs  Current medications and supplements including opioid prescriptions. Patient is not currently taking opioid prescriptions. Functional ability and  status Nutritional status Physical activity Advanced directives List of other physicians Hospitalizations, surgeries, and ER visits in previous 12 months Vitals Screenings to include cognitive, depression, and falls Referrals and appointments  In addition, I have reviewed and discussed with patient certain preventive protocols, quality metrics, and best practice recommendations. A written personalized care plan for preventive services as well as general preventive health recommendations were provided to patient.     [place]  NP   09/22/2022   Nurse Notes: Scheduled to see in house provider for eye exam next time they come.

## 2022-09-22 NOTE — Patient Instructions (Signed)
  [name] [name] , Thank you for taking time to come for your Medicare Wellness Visit. I appreciate your ongoing commitment to your health goals. Please review the following plan we discussed and let me know if I can assist you in the future.   These are the goals we discussed:  Goals      Absence of Fall and Fall-Related Injury     Evidence-based guidance:  Assess fall risk using a validated tool when available. Consider balance and gait impairment, muscle weakness, diminished vision or hearing, environmental hazards, presence of urinary or bowel urgency and/or incontinence.  Communicate fall injury risk to interprofessional healthcare team.  Develop a fall prevention plan with the patient and family.  Promote use of personal vision and auditory aids.  Promote reorientation, appropriate sensory stimulation, and routines to decrease risk of fall when changes in mental status are present.  Assess assistance level required for safe and effective self-care; consider referral for home care.  Encourage physical activity, such as performance of self-care at highest level of ability, strength and balance exercise program, and provision of appropriate assistive devices; refer to rehabilitation therapy.  Refer to community-based fall prevention program where available.  If fall occurs, determine the cause and revise fall injury prevention plan.  Regularly review medication contribution to fall risk; consider risk related to polypharmacy and age.  Refer to pharmacist for consultation when concerns about medications are revealed.  Balance adequate pain management with potential for oversedation.  Provide guidance related to environmental modifications.  Consider supplementation with Vitamin D.   Notes:      Functional Decline Minimized     Evidence-based guidance:  Assess fall risk, including balance and gait impairment, muscle weakness, diminished vision or hearing, environmental hazards, effects of  medication and dehydration.  Assess and review gait speed, decreased muscle strength or impaired functional mobility; consider use of validated tool if available.  Prepare patient for rehabilitation services to develop an individualized activity and exercise program as indicated, such as progressive resistance strengthening, balance and activity tolerance training or home exercise program.  Prevent falls with environmental adjustment; encourage compliance with exercise program, management of incontinence and adequate vitamin D and calcium intake from food or supplements.  Promote gradual increase in intensity of activity and exercise as tolerated, such as duration, frequency, exercise repetition and sets and intensity.  Provide guidance and interventions aimed at fall prevention.  Review or assess presence of reduced muscle mass or results of dual-energy x-ray absorptiometry.   Notes:         This is a list of the screening recommended for you and due dates:  Health Maintenance  Topic Date Due   COVID-19 Vaccine (8 - 2023-24 season) 03/25/2023*   Pneumonia Vaccine (2 of 2 - PCV) 08/06/2023*   Medicare Annual Wellness Visit  09/23/2023   DTaP/Tdap/Td vaccine (3 - Td or Tdap) 05/14/2029   Flu Shot  Completed   DEXA scan (bone density measurement)  Completed   Zoster (Shingles) Vaccine  Completed   HPV Vaccine  Aged Out  *Topic was postponed. The date shown is not the original due date.

## 2022-09-23 DIAGNOSIS — H524 Presbyopia: Secondary | ICD-10-CM | POA: Diagnosis not present

## 2022-09-23 DIAGNOSIS — Z961 Presence of intraocular lens: Secondary | ICD-10-CM | POA: Diagnosis not present

## 2022-10-06 ENCOUNTER — Encounter: Payer: Self-pay | Admitting: Orthopedic Surgery

## 2022-10-06 ENCOUNTER — Non-Acute Institutional Stay (SKILLED_NURSING_FACILITY): Payer: Medicare Other | Admitting: Orthopedic Surgery

## 2022-10-06 DIAGNOSIS — M549 Dorsalgia, unspecified: Secondary | ICD-10-CM

## 2022-10-06 DIAGNOSIS — R2681 Unsteadiness on feet: Secondary | ICD-10-CM

## 2022-10-06 DIAGNOSIS — W19XXXA Unspecified fall, initial encounter: Secondary | ICD-10-CM | POA: Diagnosis not present

## 2022-10-06 DIAGNOSIS — F0392 Unspecified dementia, unspecified severity, with psychotic disturbance: Secondary | ICD-10-CM

## 2022-10-06 NOTE — Progress Notes (Signed)
Location:  [place] Room Number: 35/A Place of Service:  SNF [phone]) Provider:  Yvonna Alanis, NP   Virgie Dad, MD  Patient Care Team: Virgie Dad, MD as PCP - General (Internal Medicine) Mast, Man X, NP as Nurse Practitioner (Internal Medicine)  Extended Emergency Contact Information Primary Emergency Contact: Boehner,[name] Address: [address]          [place], [place] [address] [place] Phone: [phone] Mobile Phone: [phone] Relation: Son Secondary Emergency Contact: Burdell, Kathi Der [place] Phone: [phone] Mobile Phone: [phone] Relation: Other  Code Status:  DNR Goals of care: Advanced Directive information    09/22/2022    9:49 AM  Advanced Directives  Does Patient Have a Medical Advance Directive? Yes  Type of Paramedic of [place];Out of facility DNR (pink MOST or yellow form)  Does patient want to make changes to medical advance directive? No - Patient declined  Copy of [place] in Chart? Yes - validated most recent copy scanned in chart (See row information)     Chief Complaint  Patient presents with   Acute Visit    Fall, increased confusion    HPI:  Pt is a 89 y.o. female seen today for acute visit due to fall and increased confusion.   She currently resides on the skilled nursing unit at [facility]. PMH: HTN, HLD, diastolic dysfunction, dementia, pancytopenia, hypothyroidism, chronic back pain, GERD, unstable gait and depression with anxiety.   This morning she was on the blue floor mats next to her bed. She was trying to get into recliner by herself. No apparent injury. Nursing staff was able to help her back into bed. During this time staff noticed she was confused about time of day.   During our encounter she is appropriate with questions. She remembers trying to get up on her own. She states she did not want to bother staff.  H/o chronic back pain, she reports increased pain between shoulder blades. Pain rated 3/10, described as sore, no radiation. Falls safety discussed with patient and nurse. Advised to use call bell when changing positions.    Past Medical History:  Diagnosis Date   Anemia    Anxiety    Chronotropic incompetence 12/2012    potentially medication related; noted on CPET    DDD (degenerative disc disease)    Eczema    GERD (gastroesophageal reflux disease)    H/O hiatal hernia    Hx: UTI (urinary tract infection)    Hyperlipidemia    Hypertension    Hypothyroidism    Osteopenia    Septicemia ([place]) 2002   following UTI   Vertigo, benign positional    Past Surgical History:  Procedure Laterality Date   BREAST SURGERY     left biopsy   CATARACT EXTRACTION Right    2 weeks ago   CPET / MET - PFTS     Consistent with chronotropic incompetence; See attached report in the results section   DILATION AND CURETTAGE OF UTERUS     x 2   DOPPLER ECHOCARDIOGRAPHY  01/10/2013   Normal LV size and function. Normal EF. Air sclerosis but no stenosis   ESOPHAGOGASTRODUODENOSCOPY  05/04/2012   Procedure: ESOPHAGOGASTRODUODENOSCOPY (EGD);  Surgeon: Inda Castle, MD;  Location: [facility];  Service: Endoscopy;  Laterality: N/A;   EYE SURGERY     cataract extraction with ILO  ? eye  IR KYPHO EA ADDL LEVEL THORACIC OR LUMBAR  10/28/2016   IR KYPHO LUMBAR INC FX REDUCE BONE BX UNI/BIL CANNULATION INC/IMAGING  10/28/2016   IR KYPHO THORACIC WITH BONE BIOPSY  12/24/2016   IR RADIOLOGIST EVAL & MGMT  11/11/2016   KNEE ARTHROSCOPY  2011   right   ORIF ANKLE FRACTURE Right 03/27/2021   Procedure: OPEN REDUCTION INTERNAL FIXATION (ORIF) ANKLE FRACTURE;  Surgeon: Marchia Bond, MD;  Location: [facility];  Service: Orthopedics;  Laterality: Right;   TONSILLECTOMY     TOTAL KNEE ARTHROPLASTY  04/24/2012   Procedure: TOTAL KNEE ARTHROPLASTY;  Surgeon: Gearlean Alf, MD;  Location: [facility];  Service: Orthopedics;   Laterality: Right;   TRANSTHORACIC ECHOCARDIOGRAM  02/2018   Ordered by PCP for "congestive heart failure " -EF 60 M 65%.  GR 1 DD.  No R WMA--- > ESSENTIALLY NORMAL    No Known Allergies  Outpatient Encounter Medications as of 10/06/2022  Medication Sig   acetaminophen (TYLENOL) 500 MG tablet Take 1,000 mg by mouth every 8 (eight) hours. For pain   ALPRAZolam (XANAX) 0.25 MG tablet Take 1 tablet (0.25 mg total) by mouth 2 (two) times daily as needed for anxiety. Don't give unless very anxious   atorvastatin (LIPITOR) 10 MG tablet Take 5 mg by mouth at bedtime.   buPROPion (WELLBUTRIN SR) 150 MG 12 hr tablet Take 1 tablet (150 mg total) by mouth 2 (two) times daily after a meal.   calcium citrate-vitamin D (CITRACAL+D) 315-200 MG-UNIT tablet Take 1 tablet by mouth 2 (two) times daily.   Camphor-Menthol-Methyl Sal 3.07-31-08 % PTCH Place 1 patch onto the skin See admin instructions. Apply one patch onto the skin of the  lower back daily for back pain   Carboxymethylcellulose Sodium (ARTIFICIAL TEARS OP) Place 1 drop into both eyes in the morning, at noon, and at bedtime.   diclofenac Sodium (VOLTAREN) 1 % GEL Apply 4 g topically in the morning and at bedtime. Apply to bilateral ankles BID   ferrous sulfate 325 (65 FE) MG EC tablet Take 325 mg by mouth every Monday, Wednesday, and Friday. With a meal   gabapentin (NEURONTIN) 100 MG capsule Take 100 mg by mouth 2 (two) times daily.   gabapentin (NEURONTIN) 100 MG capsule Take 200 mg by mouth at bedtime.   hydrocortisone cream 1 % Apply 1 application topically daily as needed for itching.   levothyroxine (SYNTHROID) 75 MCG tablet Take 75 mcg by mouth every morning.   losartan (COZAAR) 25 MG tablet Take 25 mg by mouth every morning.   Menthol, Topical Analgesic, (BIOFREEZE) 4 % GEL Apply topically 2 (two) times daily as needed. Apply thin layer to left thigh   nystatin (MYCOSTATIN/NYSTOP) powder Apply 1 application  topically as needed (yeast).    nystatin cream (MYCOSTATIN) Apply 1 Application topically as needed.   omega-3 fish oil (MAXEPA) 1000 MG CAPS capsule Take 1 capsule by mouth daily. EVERY MORNING *TAKE WITH FOOD*   omeprazole (PRILOSEC) 40 MG capsule Take 40 mg by mouth every morning.   OxyCODONE HCl, Abuse Deter, (OXAYDO) 5 MG TABA Take 2.5 mg by mouth as needed. Give 30 minutes before transportation to ortho appointments.   polyethylene glycol (MIRALAX / GLYCOLAX) 17 g packet Take 17 g by mouth every other day.   potassium chloride SA (KLOR-CON M) 20 MEQ tablet Take 20 mEq by mouth as directed. Monday, Tuesday, Wednesday, Thursday, and Friday.   senna (SENOKOT) 8.6 MG TABS tablet Take 2  tablets by mouth at bedtime.   sertraline (ZOLOFT) 25 MG tablet Take 75 mg by mouth every morning.   torsemide (DEMADEX) 20 MG tablet Take 20 mg by mouth as directed. Once A Day on Mon, Tue, Wed, Thu, Fri   zinc oxide 20 % ointment Apply 1 application topically See admin instructions. Apply topically to per/buttocks after every incontinent episode and as needed for redness   No facility-administered encounter medications on file as of 10/06/2022.    Review of Systems  Constitutional:  Negative for activity change and appetite change.  Respiratory:  Negative for cough, shortness of breath and wheezing.   Cardiovascular:  Negative for chest pain and leg swelling.  Gastrointestinal:  Negative for abdominal distention and abdominal pain.  Genitourinary:  Negative for dysuria and frequency.  Musculoskeletal:  Positive for arthralgias, back pain and gait problem.  Skin:  Negative for wound.  Neurological:  Positive for weakness. Negative for dizziness and headaches.  Psychiatric/Behavioral:  Positive for confusion and dysphoric mood. Negative for sleep disturbance. The patient is nervous/anxious.     Immunization History  Administered Date(s) Administered   Fluad Quad(high Dose 65+) 05/19/2022   Influenza, High Dose Seasonal PF 04/26/2016,  03/22/2017, 05/08/2019   Influenza,inj,Quad PF,6+ Mos 04/27/2018   Influenza-Unspecified 04/22/2011, 04/04/2012, 05/03/2013, 05/04/2013, 04/17/2014, 04/19/2015, 04/26/2016, 04/22/2017, 05/20/2021   Moderna SARS-COV2 Booster Vaccination 06/09/2020, 12/31/2020   Moderna Sars-Covid-2 Vaccination 07/30/2019, 08/27/2019, 01/06/2022   PFIZER(Purple Top)SARS-COV-2 Vaccination 06/01/2022   Pfizer Covid-19 Vaccine Bivalent Booster 64yr & up 04/15/2021   Pneumococcal Polysaccharide-23 08/26/2003, 09/06/2011, 05/15/2019   Pneumococcal-Unspecified 12/20/2013   Td 09/26/2006   Tdap 05/15/2019   Zoster Recombinat (Shingrix) 11/21/2017, 04/08/2018   Zoster, Live 10/25/2007   Pertinent  Health Maintenance Due  Topic Date Due   OPHTHALMOLOGY EXAM  03/24/2023   INFLUENZA VACCINE  Completed   DEXA SCAN  Completed      04/04/2021    6:13 PM 09/18/2021   12:48 PM 07/07/2022   10:23 AM 08/11/2022    4:00 PM 09/22/2022   12:30 PM  Fall Risk  Falls in the past year?  1 1 0 1  Was there an injury with Fall?  1 1 0 0  Fall Risk Category Calculator  3 3 0 1  Fall Risk Category (Retired)  [employer]   (RETIRED) Patient Fall Risk Level High fall risk High fall risk High fall risk    Patient at Risk for Falls Due to  History of fall(s);Impaired balance/gait;Impaired mobility;Orthopedic patient History of fall(s);Impaired balance/gait;Impaired mobility;Orthopedic patient History of fall(s);Impaired balance/gait;Impaired mobility;Orthopedic patient History of fall(s);Impaired balance/gait;Impaired mobility  Fall risk Follow up  Falls evaluation completed;Education provided;Falls prevention discussed Falls evaluation completed Falls evaluation completed Falls evaluation completed;Education provided;Falls prevention discussed   Functional Status Survey:    Vitals:   10/06/22 1120 10/06/22 1121  BP: (!) 150/70 136/76  Pulse: 69   Resp: 18   Temp: (!) 97.1 F (36.2 C)   SpO2: 95%   Weight: 155 lb 12.8 oz  (70.7 kg)   Height: '5\' 4"'$  (1.626 m)    Body mass index is 26.74 kg/m. Physical Exam Vitals reviewed.  Constitutional:      General: She is not in acute distress. HENT:     Head: Normocephalic.  Eyes:     General:        Right eye: No discharge.        Left eye: No discharge.  Neck:     Comments: Forward neck protrusion  Cardiovascular:     Rate and Rhythm: Normal rate and regular rhythm.     Pulses: Normal pulses.     Heart sounds: Normal heart sounds.  Pulmonary:     Effort: Pulmonary effort is normal. No respiratory distress.     Breath sounds: Normal breath sounds. No wheezing.  Abdominal:     General: Bowel sounds are normal. There is no distension.     Palpations: Abdomen is soft.     Tenderness: There is no abdominal tenderness.  Musculoskeletal:     Cervical back: Normal and neck supple.     Thoracic back: Tenderness present.     Lumbar back: Normal.     Right lower leg: No edema.     Left lower leg: No edema.     Comments: Kyphosis, able to move extremities without difficulty  Skin:    General: Skin is warm.     Capillary Refill: Capillary refill takes less than 2 seconds.  Neurological:     General: No focal deficit present.     Mental Status: Mental status is at baseline.     Motor: Weakness present.     Gait: Gait abnormal.  Psychiatric:        Mood and Affect: Mood normal.        Behavior: Behavior normal.     Labs reviewed: Recent Labs    10/08/21 0000 05/31/22 0850  NA 137 139  K 4.4 3.7  CL 99 107  CO2 36* 28*  BUN 16 17  CREATININE 0.9 1.2*  CALCIUM 8.9 9.1   Recent Labs    10/08/21 0000 05/31/22 0850  AST 16 18  ALT 7 16  ALKPHOS 81 92  ALBUMIN 3.2* 3.7   Recent Labs    10/08/21 0000 10/30/21 0000 05/31/22 0850  WBC 11.0 5.6 5.7  NEUTROABS  --  4,256.00  --   HGB 8.0* 8.4* 7.9*  HCT 26* 28* 26*  PLT 122* 104* 96*   Lab Results  Component Value Date   TSH 3.25 05/31/2022   No results found for: "HGBA1C" Lab Results   Component Value Date   CHOL 125 10/30/2021   HDL 38 10/30/2021   LDLCALC 67 10/30/2021   TRIG 123 10/30/2021   CHOLHDL 3.8 11/23/2018    Significant Diagnostic Results in last 30 days:  No results found.  Assessment/Plan 1. Acute upper back pain - mechanical fall 03/13 - reports tenderness between shoulder blades - exam unremarkable> kyphosis/ poor posturing - cont tylenol, gabapentin and lidocaine patches for chronic back pain - if pain persists consider xray upper spine  2. Fall, initial encounter - see above - falls safety discussed> advised to use call bell with position change - cont floor mats at night  3. Unstable gait - ambulates with wheelchair - cont skilled nursing  4. Hallucinations due to late onset dementia [facility]) - suspect confusion related to dementia - appropriate during encounter - cont skilled nursing     Family/ staff Communication: plan discussed with patient and nurse  Labs/tests ordered:  none

## 2022-10-11 ENCOUNTER — Encounter: Payer: Self-pay | Admitting: Orthopedic Surgery

## 2022-10-11 ENCOUNTER — Non-Acute Institutional Stay (SKILLED_NURSING_FACILITY): Payer: Medicare Other | Admitting: Orthopedic Surgery

## 2022-10-11 DIAGNOSIS — B379 Candidiasis, unspecified: Secondary | ICD-10-CM | POA: Diagnosis not present

## 2022-10-11 MED ORDER — FLUCONAZOLE 150 MG PO TABS: 150.0000 mg | ORAL_TABLET | Freq: Once | ORAL | 0 refills | Status: AC

## 2022-10-11 NOTE — Progress Notes (Signed)
Location:  [place] Room Number: 35/A Place of Service:  SNF [phone]) Provider:  Yvonna Alanis, NP   Virgie Dad, MD  Patient Care Team: Virgie Dad, MD as PCP - General (Internal Medicine) Mast, Man X, NP as Nurse Practitioner (Internal Medicine)  Extended Emergency Contact Information Primary Emergency Contact: Shevchenko,[name] Address: [address]          [place], [place] [address] [place] Phone: [phone] Mobile Phone: [phone] Relation: Son Secondary Emergency Contact: Danzy, Kathi Der [place] of [place] Phone: [phone] Mobile Phone: [phone] Relation: Other  Code Status:  DNR Goals of care: Advanced Directive information    09/22/2022    9:49 AM  Advanced Directives  Does Patient Have a Medical Advance Directive? Yes  Type of Paramedic of [place];Out of facility DNR (pink MOST or yellow form)  Does patient want to make changes to medical advance directive? No - Patient declined  Copy of [place] in Chart? Yes - validated most recent copy scanned in chart (See row information)     Chief Complaint  Patient presents with   Acute Visit    Vaginal itching    HPI:  Pt is a 89 y.o. female seen today for acute visit due to vaginal itching.   She currently resides on the skilled nursing unit at [facility]. PMH: HTN, HLD, diastolic dysfunction, dementia, pancytopenia, hypothyroidism, chronic back pain, GERD, unstable gait and depression with anxiety.   03/16 she began to c/o vaginal itching. H/o candidal skin infections in past. Unclear if nursing tried nystatin power/cream. Today, she confirms intermittent vaginal itching. Afebrile. Vitals stable.    Past Medical History:  Diagnosis Date   Anemia    Anxiety    Chronotropic incompetence 12/2012    potentially medication related; noted on CPET    DDD (degenerative disc disease)    Eczema    GERD  (gastroesophageal reflux disease)    H/O hiatal hernia    Hx: UTI (urinary tract infection)    Hyperlipidemia    Hypertension    Hypothyroidism    Osteopenia    Septicemia ([place]) 2002   following UTI   Vertigo, benign positional    Past Surgical History:  Procedure Laterality Date   BREAST SURGERY     left biopsy   CATARACT EXTRACTION Right    2 weeks ago   CPET / MET - PFTS     Consistent with chronotropic incompetence; See attached report in the results section   DILATION AND CURETTAGE OF UTERUS     x 2   DOPPLER ECHOCARDIOGRAPHY  01/10/2013   Normal LV size and function. Normal EF. Air sclerosis but no stenosis   ESOPHAGOGASTRODUODENOSCOPY  05/04/2012   Procedure: ESOPHAGOGASTRODUODENOSCOPY (EGD);  Surgeon: Inda Castle, MD;  Location: [facility];  Service: Endoscopy;  Laterality: N/A;   EYE SURGERY     cataract extraction with ILO  ? eye   IR KYPHO EA ADDL LEVEL THORACIC OR LUMBAR  10/28/2016   IR KYPHO LUMBAR INC FX REDUCE BONE BX UNI/BIL CANNULATION INC/IMAGING  10/28/2016   IR KYPHO THORACIC WITH BONE BIOPSY  12/24/2016   IR RADIOLOGIST EVAL & MGMT  11/11/2016   KNEE ARTHROSCOPY  2011   right   ORIF ANKLE FRACTURE Right 03/27/2021   Procedure: OPEN REDUCTION INTERNAL FIXATION (ORIF) ANKLE FRACTURE;  Surgeon: Marchia Bond, MD;  Location: [facility];  Service: Orthopedics;  Laterality:  Right;   TONSILLECTOMY     TOTAL KNEE ARTHROPLASTY  04/24/2012   Procedure: TOTAL KNEE ARTHROPLASTY;  Surgeon: Gearlean Alf, MD;  Location: [facility];  Service: Orthopedics;  Laterality: Right;   TRANSTHORACIC ECHOCARDIOGRAM  02/2018   Ordered by PCP for "congestive heart failure " -EF 60 M 65%.  GR 1 DD.  No R WMA--- > ESSENTIALLY NORMAL    No Known Allergies  Outpatient Encounter Medications as of 10/11/2022  Medication Sig   acetaminophen (TYLENOL) 500 MG tablet Take 1,000 mg by mouth every 8 (eight) hours. For pain   ALPRAZolam (XANAX) 0.25 MG tablet Take 1 tablet (0.25 mg total) by mouth  2 (two) times daily as needed for anxiety. Don't give unless very anxious   atorvastatin (LIPITOR) 10 MG tablet Take 5 mg by mouth at bedtime.   buPROPion (WELLBUTRIN SR) 150 MG 12 hr tablet Take 1 tablet (150 mg total) by mouth 2 (two) times daily after a meal.   calcium citrate-vitamin D (CITRACAL+D) 315-200 MG-UNIT tablet Take 1 tablet by mouth 2 (two) times daily.   Camphor-Menthol-Methyl Sal 3.07-31-08 % PTCH Place 1 patch onto the skin See admin instructions. Apply one patch onto the skin of the  lower back daily for back pain   Carboxymethylcellulose Sodium (ARTIFICIAL TEARS OP) Place 1 drop into both eyes in the morning, at noon, and at bedtime.   diclofenac Sodium (VOLTAREN) 1 % GEL Apply 4 g topically in the morning and at bedtime. Apply to bilateral ankles BID   ferrous sulfate 325 (65 FE) MG EC tablet Take 325 mg by mouth every Monday, Wednesday, and Friday. With a meal   gabapentin (NEURONTIN) 100 MG capsule Take 100 mg by mouth 2 (two) times daily.   gabapentin (NEURONTIN) 100 MG capsule Take 200 mg by mouth at bedtime.   hydrocortisone cream 1 % Apply 1 application topically daily as needed for itching.   levothyroxine (SYNTHROID) 75 MCG tablet Take 75 mcg by mouth every morning.   losartan (COZAAR) 25 MG tablet Take 25 mg by mouth every morning.   Menthol, Topical Analgesic, (BIOFREEZE) 4 % GEL Apply topically 2 (two) times daily as needed. Apply thin layer to left thigh   nystatin (MYCOSTATIN/NYSTOP) powder Apply 1 application  topically as needed (yeast).   nystatin cream (MYCOSTATIN) Apply 1 Application topically as needed.   omega-3 fish oil (MAXEPA) 1000 MG CAPS capsule Take 1 capsule by mouth daily. EVERY MORNING *TAKE WITH FOOD*   omeprazole (PRILOSEC) 40 MG capsule Take 40 mg by mouth every morning.   OxyCODONE HCl, Abuse Deter, (OXAYDO) 5 MG TABA Take 2.5 mg by mouth as needed. Give 30 minutes before transportation to ortho appointments.   polyethylene glycol (MIRALAX /  GLYCOLAX) 17 g packet Take 17 g by mouth every other day.   potassium chloride SA (KLOR-CON M) 20 MEQ tablet Take 20 mEq by mouth as directed. Monday, Tuesday, Wednesday, Thursday, and Friday.   senna (SENOKOT) 8.6 MG TABS tablet Take 2 tablets by mouth at bedtime.   sertraline (ZOLOFT) 25 MG tablet Take 75 mg by mouth every morning.   torsemide (DEMADEX) 20 MG tablet Take 20 mg by mouth as directed. Once A Day on Mon, Tue, Wed, Thu, Fri   zinc oxide 20 % ointment Apply 1 application topically See admin instructions. Apply topically to per/buttocks after every incontinent episode and as needed for redness   No facility-administered encounter medications on file as of 10/11/2022.    Review  of Systems  Constitutional:  Negative for activity change and appetite change.  Respiratory:  Negative for cough, shortness of breath and wheezing.   Cardiovascular:  Negative for chest pain and leg swelling.  Gastrointestinal:  Negative for abdominal distention and abdominal pain.  Genitourinary:  Negative for dysuria, frequency, pelvic pain, vaginal bleeding, vaginal discharge and vaginal pain.       Urinary incontinence  Psychiatric/Behavioral:  Positive for confusion and dysphoric mood. The patient is nervous/anxious.     Immunization History  Administered Date(s) Administered   Fluad Quad(high Dose 65+) 05/19/2022   Influenza, High Dose Seasonal PF 04/26/2016, 03/22/2017, 05/08/2019   Influenza,inj,Quad PF,6+ Mos 04/27/2018   Influenza-Unspecified 04/22/2011, 04/04/2012, 05/03/2013, 05/04/2013, 04/17/2014, 04/19/2015, 04/26/2016, 04/22/2017, 05/20/2021   Moderna SARS-COV2 Booster Vaccination 06/09/2020, 12/31/2020   Moderna Sars-Covid-2 Vaccination 07/30/2019, 08/27/2019, 01/06/2022   PFIZER(Purple Top)SARS-COV-2 Vaccination 06/01/2022   Pfizer Covid-19 Vaccine Bivalent Booster 66yrs & up 04/15/2021   Pneumococcal Polysaccharide-23 08/26/2003, 09/06/2011, 05/15/2019   Pneumococcal-Unspecified  12/20/2013   Td 09/26/2006   Tdap 05/15/2019   Zoster Recombinat (Shingrix) 11/21/2017, 04/08/2018   Zoster, Live 10/25/2007   Pertinent  Health Maintenance Due  Topic Date Due   OPHTHALMOLOGY EXAM  03/24/2023   INFLUENZA VACCINE  Completed   DEXA SCAN  Completed      09/18/2021   12:48 PM 07/07/2022   10:23 AM 08/11/2022    4:00 PM 09/22/2022   12:30 PM 10/06/2022   11:22 AM  Fall Risk  Falls in the past year? 1 1 0 1 1  Was there an injury with Fall? 1 1 0 0 0  Fall Risk Category Calculator 3 3 0 1 2  Fall Risk Category (Retired) [employer]     (RETIRED) Patient Fall Risk Level High fall risk High fall risk     Patient at Risk for Falls Due to History of fall(s);Impaired balance/gait;Impaired mobility;Orthopedic patient History of fall(s);Impaired balance/gait;Impaired mobility;Orthopedic patient History of fall(s);Impaired balance/gait;Impaired mobility;Orthopedic patient History of fall(s);Impaired balance/gait;Impaired mobility History of fall(s);Impaired balance/gait;Impaired mobility;Mental status change  Fall risk Follow up Falls evaluation completed;Education provided;Falls prevention discussed Falls evaluation completed Falls evaluation completed Falls evaluation completed;Education provided;Falls prevention discussed Falls evaluation completed;Education provided;Falls prevention discussed   Functional Status Survey:    Vitals:   10/11/22 1012  BP: (!) 166/72  Pulse: 77  Resp: 18  Temp: 98 F (36.7 C)  SpO2: 93%  Weight: 159 lb (72.1 kg)   Body mass index is 27.29 kg/m. Physical Exam Vitals reviewed. Exam conducted with a chaperone present.  Constitutional:      General: She is not in acute distress. HENT:     Head: Normocephalic.  Eyes:     General:        Right eye: No discharge.        Left eye: No discharge.  Cardiovascular:     Rate and Rhythm: Normal rate and regular rhythm.     Pulses: Normal pulses.     Heart sounds: Normal heart sounds.   Pulmonary:     Effort: Pulmonary effort is normal. No respiratory distress.     Breath sounds: Normal breath sounds. No wheezing.  Genitourinary:    Labia:        Right: No rash.        Left: No rash.      Comments: Groin skin folds excoriated, no vaginal discharge observed Musculoskeletal:     Cervical back: Neck supple.  Neurological:     Mental Status: She is  alert.     Labs reviewed: Recent Labs    05/31/22 0850  NA 139  K 3.7  CL 107  CO2 28*  BUN 17  CREATININE 1.2*  CALCIUM 9.1   Recent Labs    05/31/22 0850  AST 18  ALT 16  ALKPHOS 92  ALBUMIN 3.7   Recent Labs    10/30/21 0000 05/31/22 0850  WBC 5.6 5.7  NEUTROABS 4,256.00  --   HGB 8.4* 7.9*  HCT 28* 26*  PLT 104* 96*   Lab Results  Component Value Date   TSH 3.25 05/31/2022   No results found for: "HGBA1C" Lab Results  Component Value Date   CHOL 125 10/30/2021   HDL 38 10/30/2021   LDLCALC 67 10/30/2021   TRIG 123 10/30/2021   CHOLHDL 3.8 11/23/2018    Significant Diagnostic Results in last 30 days:  No results found.  Assessment/Plan 1. Yeast infection - onset 03/16 - h/o yeast infections - exam unremarkable> groin skin folds excoriated - cont nystatin cream prn to groin - start one time diflucan to help with itching/rash - fluconazole (DIFLUCAN) 150 MG tablet; Take 1 tablet (150 mg total) by mouth once for 1 dose.  Dispense: 1 tablet; Refill: 0    Family/ staff Communication: plan discussed with patient and nurse  Labs/tests ordered:  none

## 2022-10-11 NOTE — Progress Notes (Signed)
ERROR

## 2022-10-12 ENCOUNTER — Other Ambulatory Visit: Payer: Self-pay | Admitting: Adult Health

## 2022-10-12 MED ORDER — ALPRAZOLAM 0.25 MG PO TABS: 0.2500 mg | ORAL_TABLET | Freq: Two times a day (BID) | ORAL | 0 refills | Status: DC | PRN

## 2022-10-14 ENCOUNTER — Other Ambulatory Visit: Payer: Self-pay

## 2022-10-14 NOTE — Telephone Encounter (Signed)
Refill request received from pharmacy.  Medication pended and sent to Dr. Veleta Miners

## 2022-10-20 ENCOUNTER — Non-Acute Institutional Stay (SKILLED_NURSING_FACILITY): Payer: Medicare Other | Admitting: Orthopedic Surgery

## 2022-10-20 ENCOUNTER — Encounter: Payer: Self-pay | Admitting: Orthopedic Surgery

## 2022-10-20 DIAGNOSIS — G8929 Other chronic pain: Secondary | ICD-10-CM

## 2022-10-20 DIAGNOSIS — I5189 Other ill-defined heart diseases: Secondary | ICD-10-CM

## 2022-10-20 DIAGNOSIS — E782 Mixed hyperlipidemia: Secondary | ICD-10-CM | POA: Diagnosis not present

## 2022-10-20 DIAGNOSIS — E039 Hypothyroidism, unspecified: Secondary | ICD-10-CM

## 2022-10-20 DIAGNOSIS — I10 Essential (primary) hypertension: Secondary | ICD-10-CM

## 2022-10-20 DIAGNOSIS — K219 Gastro-esophageal reflux disease without esophagitis: Secondary | ICD-10-CM | POA: Diagnosis not present

## 2022-10-20 DIAGNOSIS — H6123 Impacted cerumen, bilateral: Secondary | ICD-10-CM | POA: Diagnosis not present

## 2022-10-20 DIAGNOSIS — M545 Low back pain, unspecified: Secondary | ICD-10-CM | POA: Diagnosis not present

## 2022-10-20 DIAGNOSIS — R2681 Unsteadiness on feet: Secondary | ICD-10-CM

## 2022-10-20 DIAGNOSIS — N1831 Chronic kidney disease, stage 3a: Secondary | ICD-10-CM | POA: Diagnosis not present

## 2022-10-20 DIAGNOSIS — F0392 Unspecified dementia, unspecified severity, with psychotic disturbance: Secondary | ICD-10-CM | POA: Diagnosis not present

## 2022-10-20 DIAGNOSIS — F418 Other specified anxiety disorders: Secondary | ICD-10-CM

## 2022-10-20 NOTE — Progress Notes (Signed)
Location:  [facility] Room Number: 35/A Place of Service:  SNF [phone]) Provider:  Yvonna Alanis, NP   Virgie Dad, MD  Patient Care Team: Virgie Dad, MD as PCP - General (Internal Medicine) Mast, Man X, NP as Nurse Practitioner (Internal Medicine)  Extended Emergency Contact Information Primary Emergency Contact: Leachman,[name] Address: [address]          [place], [place] [address] [place] Phone: [phone] Mobile Phone: [phone] Relation: Son Secondary Emergency Contact: Czarnecki, Kathi [place] Phone: [phone] Mobile Phone: [phone] Relation: Other  Code Status:  DNR Goals of care: Advanced Directive information    10/11/2022   10:46 AM  Advanced Directives  Does Patient Have a Medical Advance Directive? Yes  Type of Paramedic of [place];Out of facility DNR (pink MOST or yellow form)  Does patient want to make changes to medical advance directive? No - Patient declined  Copy of [place] in Chart? Yes - validated most recent copy scanned in chart (See row information)     Chief Complaint  Patient presents with   Medical Management of Chronic Issues    HPI:  Pt is a 89 y.o. female seen today for medical management of chronic diseases.    She currently resides on the skilled nursing unit at [facility]. PMH: HTN, HLD, PVD, CKD, esophagitis, GERD, GI bleed, thrombocytopenia, pancytopenia, hypothyroidism, OA, L2 compression fracture, right trimalleolar fracture s/p ORIF 03/2021, chronic back pain, depression and anxiety.   HTN- BUN/creat 17/1.15 06/01/2022, remains on losartan HLD- LDL 67 10/30/2021, remains on Lipitor CKD- see above Diastolic dysfunction- BNP 289 10/08/2021, Echo 03/18 noted normal LV systolic function and mild LV hypertrophy, some sob with exertion, remains in torsemide M-F Hallucinations with dementia- BIMS score 15/15 (02/26)>  was 11/15 (11/23), CT head noted chronic microvascular ischemic disease 04/16/2022, periods of increased confusion with hallucinations, remains on xanax prn Hypothyroidism- TSH 3.25 05/31/2022, remains on levothyroxine GERD- hgb 7.9 06/01/2022, remains on Prilosec Depression with anxiety- no mood changes, Na+ 139 06/01/2022, remains on Wellbutrin, Zoloft and xanax prn Chronic back pain- H/o chronic compression fractures to T11, L1 and L2, remains on tylenol/gabapentin/voltaren gel/oxycodone Unstable gait- ambulates mainly with wheelchair, 1+ assist with walker  No recent falls or injuries.   Recent blood pressures:  03/26- 166/72  03/19- 135/67  03/18- 166/72  Recent weights:  03/14- 159 lbs  02/01- 155.8 lbs  01/01- 155.1 lbs  10/2021- 161.9 lbs   Past Medical History:  Diagnosis Date   Anemia    Anxiety    Chronotropic incompetence 12/2012    potentially medication related; noted on CPET    DDD (degenerative disc disease)    Eczema    GERD (gastroesophageal reflux disease)    H/O hiatal hernia    Hx: UTI (urinary tract infection)    Hyperlipidemia    Hypertension    Hypothyroidism    Osteopenia    Septicemia ([place]) 2002   following UTI   Vertigo, benign positional    Past Surgical History:  Procedure Laterality Date   BREAST SURGERY     left biopsy   CATARACT EXTRACTION Right    2 weeks ago   CPET / MET - PFTS     Consistent with chronotropic incompetence; See attached report in the results section   DILATION AND CURETTAGE OF UTERUS     x 2   DOPPLER ECHOCARDIOGRAPHY  01/10/2013   Normal LV size and function. Normal EF. Air sclerosis but no stenosis   ESOPHAGOGASTRODUODENOSCOPY  05/04/2012   Procedure: ESOPHAGOGASTRODUODENOSCOPY (EGD);  Surgeon: Inda Castle, MD;  Location: [facility];  Service: Endoscopy;  Laterality: N/A;   EYE SURGERY     cataract extraction with ILO  ? eye   IR KYPHO EA ADDL LEVEL THORACIC OR LUMBAR  10/28/2016   IR KYPHO LUMBAR INC FX  REDUCE BONE BX UNI/BIL CANNULATION INC/IMAGING  10/28/2016   IR KYPHO THORACIC WITH BONE BIOPSY  12/24/2016   IR RADIOLOGIST EVAL & MGMT  11/11/2016   KNEE ARTHROSCOPY  2011   right   ORIF ANKLE FRACTURE Right 03/27/2021   Procedure: OPEN REDUCTION INTERNAL FIXATION (ORIF) ANKLE FRACTURE;  Surgeon: Marchia Bond, MD;  Location: [facility];  Service: Orthopedics;  Laterality: Right;   TONSILLECTOMY     TOTAL KNEE ARTHROPLASTY  04/24/2012   Procedure: TOTAL KNEE ARTHROPLASTY;  Surgeon: Gearlean Alf, MD;  Location: [facility];  Service: Orthopedics;  Laterality: Right;   TRANSTHORACIC ECHOCARDIOGRAM  02/2018   Ordered by PCP for "congestive heart failure " -EF 60 M 65%.  GR 1 DD.  No R WMA--- > ESSENTIALLY NORMAL    No Known Allergies  Outpatient Encounter Medications as of 10/20/2022  Medication Sig   acetaminophen (TYLENOL) 500 MG tablet Take 1,000 mg by mouth every 8 (eight) hours. For pain   ALPRAZolam (XANAX) 0.25 MG tablet Take 1 tablet (0.25 mg total) by mouth 2 (two) times daily as needed for anxiety.   atorvastatin (LIPITOR) 10 MG tablet Take 5 mg by mouth at bedtime.   buPROPion (WELLBUTRIN SR) 150 MG 12 hr tablet Take 1 tablet (150 mg total) by mouth 2 (two) times daily after a meal.   calcium citrate-vitamin D (CITRACAL+D) 315-200 MG-UNIT tablet Take 1 tablet by mouth 2 (two) times daily.   Camphor-Menthol-Methyl Sal 3.07-31-08 % PTCH Place 1 patch onto the skin See admin instructions. Apply one patch onto the skin of the  lower back daily for back pain   Carboxymethylcellulose Sodium (ARTIFICIAL TEARS OP) Place 1 drop into both eyes in the morning, at noon, and at bedtime.   diclofenac Sodium (VOLTAREN) 1 % GEL Apply 4 g topically in the morning and at bedtime. Apply to bilateral ankles BID   ferrous sulfate 325 (65 FE) MG EC tablet Take 325 mg by mouth every Monday, Wednesday, and Friday. With a meal   gabapentin (NEURONTIN) 100 MG capsule Take 100 mg by mouth 2 (two) times daily.    gabapentin (NEURONTIN) 100 MG capsule Take 200 mg by mouth at bedtime.   hydrocortisone cream 1 % Apply 1 application topically daily as needed for itching.   levothyroxine (SYNTHROID) 75 MCG tablet Take 75 mcg by mouth every morning.   losartan (COZAAR) 25 MG tablet Take 25 mg by mouth every morning.   Menthol, Topical Analgesic, (BIOFREEZE) 4 % GEL Apply topically 2 (two) times daily as needed. Apply thin layer to left thigh   nystatin (MYCOSTATIN/NYSTOP) powder Apply 1 application  topically as needed (yeast).   nystatin cream (MYCOSTATIN) Apply 1 Application topically as needed.   omega-3 fish oil (MAXEPA) 1000 MG CAPS capsule Take 1 capsule by mouth daily. EVERY MORNING *TAKE WITH FOOD*   omeprazole (PRILOSEC) 40 MG capsule Take 40 mg by mouth every morning.   OxyCODONE HCl, Abuse Deter, (OXAYDO) 5 MG TABA Take 2.5 mg by mouth as needed. Give 30 minutes before transportation to  ortho appointments.   polyethylene glycol (MIRALAX / GLYCOLAX) 17 g packet Take 17 g by mouth every other day.   potassium chloride SA (KLOR-CON M) 20 MEQ tablet Take 20 mEq by mouth as directed. Monday, Tuesday, Wednesday, Thursday, and Friday.   senna (SENOKOT) 8.6 MG TABS tablet Take 2 tablets by mouth at bedtime.   sertraline (ZOLOFT) 25 MG tablet Take 75 mg by mouth every morning.   torsemide (DEMADEX) 20 MG tablet Take 20 mg by mouth as directed. Once A Day on Mon, Tue, Wed, Thu, Fri   zinc oxide 20 % ointment Apply 1 application topically See admin instructions. Apply topically to per/buttocks after every incontinent episode and as needed for redness   No facility-administered encounter medications on file as of 10/20/2022.    Review of Systems  Constitutional:  Negative for activity change and appetite change.  HENT:  Negative for congestion and trouble swallowing.   Eyes:  Negative for visual disturbance.  Respiratory:  Positive for shortness of breath. Negative for cough and wheezing.   Cardiovascular:   Positive for leg swelling. Negative for chest pain.  Gastrointestinal:  Negative for abdominal distention and abdominal pain.  Genitourinary:  Negative for dysuria and frequency.  Musculoskeletal:  Positive for arthralgias, back pain and gait problem.  Skin:  Negative for wound.  Neurological:  Positive for weakness. Negative for dizziness and headaches.  Psychiatric/Behavioral:  Positive for confusion and dysphoric mood. Negative for sleep disturbance. The patient is nervous/anxious.     Immunization History  Administered Date(s) Administered   Fluad Quad(high Dose 65+) 05/19/2022   Influenza, High Dose Seasonal PF 04/26/2016, 03/22/2017, 05/08/2019   Influenza,inj,Quad PF,6+ Mos 04/27/2018   Influenza-Unspecified 04/22/2011, 04/04/2012, 05/03/2013, 05/04/2013, 04/17/2014, 04/19/2015, 04/26/2016, 04/22/2017, 05/20/2021   Moderna SARS-COV2 Booster Vaccination 06/09/2020, 12/31/2020   Moderna Sars-Covid-2 Vaccination 07/30/2019, 08/27/2019, 01/06/2022   PFIZER(Purple Top)SARS-COV-2 Vaccination 06/01/2022   Pfizer Covid-19 Vaccine Bivalent Booster 87yrs & up 04/15/2021   Pneumococcal Polysaccharide-23 08/26/2003, 09/06/2011, 05/15/2019   Pneumococcal-Unspecified 12/20/2013   Td 09/26/2006   Tdap 05/15/2019   Zoster Recombinat (Shingrix) 11/21/2017, 04/08/2018   Zoster, Live 10/25/2007   Pertinent  Health Maintenance Due  Topic Date Due   OPHTHALMOLOGY EXAM  03/24/2023   INFLUENZA VACCINE  Completed   DEXA SCAN  Completed      08/11/2022    4:00 PM 09/22/2022   12:30 PM 10/06/2022   11:22 AM 10/11/2022   10:15 AM 10/20/2022   10:35 AM  Fall Risk  Falls in the past year? 0 1 1 1 1   Was there an injury with Fall? 0 0 0 0 0  Fall Risk Category Calculator 0 1 2 2 1   Patient at Risk for Falls Due to History of fall(s);Impaired balance/gait;Impaired mobility;Orthopedic patient History of fall(s);Impaired balance/gait;Impaired mobility History of fall(s);Impaired balance/gait;Impaired  mobility;Mental status change History of fall(s);Impaired balance/gait;Impaired mobility History of fall(s);Impaired balance/gait;Impaired mobility  Fall risk Follow up Falls evaluation completed Falls evaluation completed;Education provided;Falls prevention discussed Falls evaluation completed;Education provided;Falls prevention discussed Falls evaluation completed;Education provided;Falls prevention discussed Falls evaluation completed;Education provided;Falls prevention discussed   Functional Status Survey:    Vitals:   10/20/22 1034  BP: (!) 134/55  Pulse: 74  Resp: 16  Temp: (!) 97.4 F (36.3 C)  SpO2: 96%  Weight: 159 lb (72.1 kg)  Height: 5\' 4"  (1.626 m)   Body mass index is 27.29 kg/m. Physical Exam Vitals reviewed.  Constitutional:      General: She is not in acute distress.  Comments: Pale complexion  HENT:     Head: Normocephalic.     Right Ear: There is impacted cerumen.     Left Ear: There is impacted cerumen.     Nose: Nose normal.     Mouth/Throat:     Mouth: Mucous membranes are moist.  Eyes:     General:        Right eye: No discharge.        Left eye: No discharge.  Cardiovascular:     Rate and Rhythm: Normal rate and regular rhythm.     Pulses: Normal pulses.     Heart sounds: Normal heart sounds.  Pulmonary:     Effort: Pulmonary effort is normal. No respiratory distress.     Breath sounds: Normal breath sounds. No wheezing or rales.  Abdominal:     General: Bowel sounds are normal. There is no distension.     Palpations: Abdomen is soft.     Tenderness: There is no abdominal tenderness.  Musculoskeletal:     Cervical back: Neck supple.     Right lower leg: No edema.     Left lower leg: No edema.  Skin:    General: Skin is warm and dry.     Capillary Refill: Capillary refill takes less than 2 seconds.  Neurological:     General: No focal deficit present.     Mental Status: She is alert. Mental status is at baseline.     Motor: Weakness  present.     Gait: Gait abnormal.     Comments: wheelchair  Psychiatric:        Mood and Affect: Mood normal.        Behavior: Behavior normal.     Comments: Alert x 4, follows commands, very pleasant     Labs reviewed: Recent Labs    05/31/22 0850  NA 139  K 3.7  CL 107  CO2 28*  BUN 17  CREATININE 1.2*  CALCIUM 9.1   Recent Labs    05/31/22 0850  AST 18  ALT 16  ALKPHOS 92  ALBUMIN 3.7   Recent Labs    10/30/21 0000 05/31/22 0850  WBC 5.6 5.7  NEUTROABS 4,256.00  --   HGB 8.4* 7.9*  HCT 28* 26*  PLT 104* 96*   Lab Results  Component Value Date   TSH 3.25 05/31/2022   No results found for: "HGBA1C" Lab Results  Component Value Date   CHOL 125 10/30/2021   HDL 38 10/30/2021   LDLCALC 67 10/30/2021   TRIG 123 10/30/2021   CHOLHDL 3.8 11/23/2018    Significant Diagnostic Results in last 30 days:  No results found.  Assessment/Plan: 1. Bilateral impacted cerumen - start Debrox x 5 days - flush ears when debrox complete  2. Essential hypertension - elevated - blood pressures BID x 5 days> give to PCP - cont losartan  3. Mixed hyperlipidemia - cont statin  4. Stage 3a chronic kidney disease (HCC) - avoid nephrotoxic drugs like NSAIDS and dose adjust medications to be renally excreted - encourage hydration with water   5. Diastolic dysfunction - compensated - cont torsemide M-F  6. Hallucinations due to late onset dementia ([place]) - no recent episodes - recent BIMS 15/15 (02/26) - appropriate today - cot xanax prn  7. Acquired hypothyroidism - TSH stable - cont levothyroxine  8. Gastroesophageal reflux disease, unspecified whether esophagitis present - hgb stable - cont omeprazole  9. Depression with anxiety - no mood changes -  Na+ stable - cont Zoloft, Wellbutrin and Buspar  10. Chronic bilateral low back pain without sciatica - stable with tylenol, Voltaren gel, [place], and oxycodone prn  11. Unstable gait - no recent  falls - ambulates with wheelchair - cont skilled nursing    Family/ staff Communication: plan discussed with patient and nurse  Labs/tests ordered:  cbc/diff, cmp, lipid panel 10/25/2022

## 2022-10-25 DIAGNOSIS — E785 Hyperlipidemia, unspecified: Secondary | ICD-10-CM | POA: Diagnosis not present

## 2022-10-25 DIAGNOSIS — D649 Anemia, unspecified: Secondary | ICD-10-CM | POA: Diagnosis not present

## 2022-10-25 LAB — BASIC METABOLIC PANEL
BUN: 25 — AB (ref 4–21)
CO2: 27 — AB (ref 13–22)
Chloride: 107 (ref 99–108)
Creatinine: 1.3 — AB (ref 0.5–1.1)
Glucose: 91
Potassium: 4 mEq/L (ref 3.5–5.1)
Sodium: 140 (ref 137–147)

## 2022-10-25 LAB — LIPID PANEL
Cholesterol: 125 (ref 0–200)
HDL: 39 (ref 35–70)
LDL Cholesterol: 69
LDl/HDL Ratio: 3.2
Triglycerides: 89 (ref 40–160)

## 2022-10-25 LAB — COMPREHENSIVE METABOLIC PANEL
Albumin: 3.8 (ref 3.5–5.0)
Calcium: 9.4 (ref 8.7–10.7)
Globulin: 2.7
eGFR: 40

## 2022-10-25 LAB — CBC AND DIFFERENTIAL
HCT: 27 — AB (ref 36–46)
Hemoglobin: 8.5 — AB (ref 12.0–16.0)
Neutrophils Absolute: 2748
Platelets: 78 10*3/uL — AB (ref 150–400)
WBC: 4

## 2022-10-25 LAB — CBC: RBC: 3.02 — AB (ref 3.87–5.11)

## 2022-10-25 LAB — HEPATIC FUNCTION PANEL
ALT: 8 U/L (ref 7–35)
AST: 13 (ref 13–35)
Alkaline Phosphatase: 78 (ref 25–125)
Bilirubin, Total: 0.3

## 2022-10-26 ENCOUNTER — Non-Acute Institutional Stay (SKILLED_NURSING_FACILITY): Payer: Medicare Other | Admitting: Adult Health

## 2022-10-26 ENCOUNTER — Encounter: Payer: Self-pay | Admitting: Adult Health

## 2022-10-26 ENCOUNTER — Other Ambulatory Visit: Payer: Self-pay | Admitting: Adult Health

## 2022-10-26 DIAGNOSIS — F339 Major depressive disorder, recurrent, unspecified: Secondary | ICD-10-CM

## 2022-10-26 DIAGNOSIS — F419 Anxiety disorder, unspecified: Secondary | ICD-10-CM

## 2022-10-26 DIAGNOSIS — I10 Essential (primary) hypertension: Secondary | ICD-10-CM

## 2022-10-26 NOTE — Progress Notes (Signed)
Location:  [facility] Room Number: 35A Place of Service:  SNF (31) Provider:  Margit Banda. Grayce Sessions, NP   Patient Care Team: Mahlon Gammon, MD as PCP - General (Internal Medicine) Mast, Man X, NP as Nurse Practitioner (Internal Medicine)  Extended Emergency Contact Information Primary Emergency Contact: Amyx,[name] Address: [address]          [place], [place] [address] [place] Home Phone: [phone] Mobile Phone: [phone] Relation: Son Secondary Emergency Contact: Gouger, Cindra [place] Home Phone: [phone] Mobile Phone: [phone] Relation: Other  Code Status:  DNR Goals of care: Advanced Directive information    10/26/2022    9:28 AM  Advanced Directives  Does Patient Have a Medical Advance Directive? Yes  Type of Estate agent of [place];Out of facility DNR (pink MOST or yellow form)  Does patient want to make changes to medical advance directive? No - Patient declined  Copy of Healthcare Power of Attorney in Chart? Yes - validated most recent copy scanned in chart (See row information)  Pre-existing out of facility DNR order (yellow form or pink MOST form) Pink MOST/Yellow Form most recent copy in chart - Physician notified to receive inpatient order     Chief Complaint  Patient presents with   Acute Visit    Anxiety    HPI:  Pt is a 89 y.o. female seen today for an acute visit for anxiety. She is a long-term care resident of [facility]. She had an order for Alprazolam PRN that expired. She wants to get out of the facility and does not know why she is there. She gets anxious mostly during the evening. She has cognitive impairment. She has a PMH of   Past Medical History:  Diagnosis Date   Anemia    Anxiety    Chronotropic incompetence 12/2012    potentially medication related; noted on CPET    DDD (degenerative disc disease)    Eczema    GERD (gastroesophageal  reflux disease)    H/O hiatal hernia    Hx: UTI (urinary tract infection)    Hyperlipidemia    Hypertension    Hypothyroidism    Osteopenia    Septicemia 2002   following UTI   Vertigo, benign positional    Past Surgical History:  Procedure Laterality Date   BREAST SURGERY     left biopsy   CATARACT EXTRACTION Right    2 weeks ago   CPET / MET - PFTS     Consistent with chronotropic incompetence; See attached report in the results section   DILATION AND CURETTAGE OF UTERUS     x 2   DOPPLER ECHOCARDIOGRAPHY  01/10/2013   Normal LV size and function. Normal EF. Air sclerosis but no stenosis   ESOPHAGOGASTRODUODENOSCOPY  05/04/2012   Procedure: ESOPHAGOGASTRODUODENOSCOPY (EGD);  Surgeon: Louis Meckel, MD;  Location: [facility];  Service: Endoscopy;  Laterality: N/A;   EYE SURGERY     cataract extraction with ILO  ? eye   IR KYPHO EA ADDL LEVEL THORACIC OR LUMBAR  10/28/2016   IR KYPHO LUMBAR INC FX REDUCE BONE BX UNI/BIL CANNULATION INC/IMAGING  10/28/2016   IR KYPHO THORACIC WITH BONE BIOPSY  12/24/2016   IR RADIOLOGIST EVAL & MGMT  11/11/2016   KNEE ARTHROSCOPY  2011   right   ORIF ANKLE FRACTURE Right 03/27/2021   Procedure: OPEN REDUCTION INTERNAL FIXATION (ORIF) ANKLE FRACTURE;  Surgeon: Teryl Lucy,  MD;  Location: [facility];  Service: Orthopedics;  Laterality: Right;   TONSILLECTOMY     TOTAL KNEE ARTHROPLASTY  04/24/2012   Procedure: TOTAL KNEE ARTHROPLASTY;  Surgeon: Loanne Drilling, MD;  Location: [facility];  Service: Orthopedics;  Laterality: Right;   TRANSTHORACIC ECHOCARDIOGRAM  02/2018   Ordered by PCP for "congestive heart failure " -EF 60 M 65%.  GR 1 DD.  No R WMA--- > ESSENTIALLY NORMAL    No Known Allergies  Outpatient Encounter Medications as of 10/26/2022  Medication Sig   acetaminophen (TYLENOL) 500 MG tablet Take 1,000 mg by mouth every 8 (eight) hours. For pain   atorvastatin (LIPITOR) 10 MG tablet Take 5 mg by mouth at bedtime.   buPROPion (WELLBUTRIN SR)  150 MG 12 hr tablet Take 1 tablet (150 mg total) by mouth 2 (two) times daily after a meal.   calcium citrate-vitamin D (CITRACAL+D) 315-200 MG-UNIT tablet Take 1 tablet by mouth 2 (two) times daily.   Camphor-Menthol-Methyl Sal 3.07-31-08 % PTCH Place 1 patch onto the skin See admin instructions. Apply one patch onto the skin of the  lower back daily for back pain   Carboxymethylcellulose Sodium (ARTIFICIAL TEARS OP) Place 1 drop into both eyes in the morning, at noon, and at bedtime.   diclofenac Sodium (VOLTAREN) 1 % GEL Apply 4 g topically in the morning and at bedtime. Apply to bilateral ankles BID   ferrous sulfate 325 (65 FE) MG EC tablet Take 325 mg by mouth every Monday, Wednesday, and Friday. With a meal   gabapentin (NEURONTIN) 100 MG capsule Take 100 mg by mouth 2 (two) times daily.   gabapentin (NEURONTIN) 100 MG capsule Take 200 mg by mouth at bedtime.   hydrocortisone cream 1 % Apply 1 application topically daily as needed for itching.   levothyroxine (SYNTHROID) 75 MCG tablet Take 75 mcg by mouth every morning.   losartan (COZAAR) 25 MG tablet Take 25 mg by mouth every morning.   Menthol, Topical Analgesic, (BIOFREEZE) 4 % GEL Apply topically 2 (two) times daily as needed. Apply thin layer to left thigh   nystatin (MYCOSTATIN/NYSTOP) powder Apply 1 application  topically as needed (yeast).   nystatin cream (MYCOSTATIN) Apply 1 Application topically as needed.   omega-3 fish oil (MAXEPA) 1000 MG CAPS capsule Take 1 capsule by mouth daily. EVERY MORNING *TAKE WITH FOOD*   omeprazole (PRILOSEC) 40 MG capsule Take 40 mg by mouth every morning.   OxyCODONE HCl, Abuse Deter, (OXAYDO) 5 MG TABA Take 2.5 mg by mouth as needed. Give 30 minutes before transportation to ortho appointments.   polyethylene glycol (MIRALAX / GLYCOLAX) 17 g packet Take 17 g by mouth every other day.   potassium chloride SA (KLOR-CON M) 20 MEQ tablet Take 20 mEq by mouth as directed. Monday, Tuesday, Wednesday,  Thursday, and Friday.   senna (SENOKOT) 8.6 MG TABS tablet Take 2 tablets by mouth at bedtime.   sertraline (ZOLOFT) 25 MG tablet Take 75 mg by mouth every morning.   torsemide (DEMADEX) 20 MG tablet Take 20 mg by mouth daily as needed. Once A Day on Mon, Tue, Wed, Thu, Fri   zinc oxide 20 % ointment Apply 1 application topically See admin instructions. Apply topically to per/buttocks after every incontinent episode and as needed for redness   [DISCONTINUED] ALPRAZolam (XANAX) 0.25 MG tablet Take 1 tablet (0.25 mg total) by mouth 2 (two) times daily as needed for anxiety.   No facility-administered encounter medications on file  as of 10/26/2022.   Review of systems:  Unable to obtain due to dementia.   Immunization History  Administered Date(s) Administered   Fluad Quad(high Dose 65+) 05/19/2022   Influenza, High Dose Seasonal PF 04/26/2016, 03/22/2017, 05/08/2019   Influenza,inj,Quad PF,6+ Mos 04/27/2018   Influenza-Unspecified 04/22/2011, 04/04/2012, 05/03/2013, 05/04/2013, 04/17/2014, 04/19/2015, 04/26/2016, 04/22/2017, 05/20/2021   Moderna SARS-COV2 Booster Vaccination 06/09/2020, 12/31/2020   Moderna Sars-Covid-2 Vaccination 07/30/2019, 08/27/2019, 01/06/2022   PFIZER(Purple Top)SARS-COV-2 Vaccination 06/01/2022   Pfizer Covid-19 Vaccine Bivalent Booster 20yrs & up 04/15/2021   Pneumococcal Polysaccharide-23 08/26/2003, 09/06/2011, 05/15/2019   Pneumococcal-Unspecified 12/20/2013   Td 09/26/2006   Tdap 05/15/2019   Zoster Recombinat (Shingrix) 11/21/2017, 04/08/2018   Zoster, Live 10/25/2007   Pertinent  Health Maintenance Due  Topic Date Due   INFLUENZA VACCINE  02/24/2023   OPHTHALMOLOGY EXAM  03/24/2023   DEXA SCAN  Completed      09/22/2022   12:30 PM 10/06/2022   11:22 AM 10/11/2022   10:15 AM 10/20/2022   10:35 AM 10/26/2022    9:26 AM  Fall Risk  Falls in the past year? 1 1 1 1  0  Was there an injury with Fall? 0 0 0 0 0  Fall Risk Category Calculator 1 2 2 1  0   Patient at Risk for Falls Due to History of fall(s);Impaired balance/gait;Impaired mobility History of fall(s);Impaired balance/gait;Impaired mobility;Mental status change History of fall(s);Impaired balance/gait;Impaired mobility History of fall(s);Impaired balance/gait;Impaired mobility History of fall(s);Impaired balance/gait;Impaired mobility  Fall risk Follow up Falls evaluation completed;Education provided;Falls prevention discussed Falls evaluation completed;Education provided;Falls prevention discussed Falls evaluation completed;Education provided;Falls prevention discussed Falls evaluation completed;Education provided;Falls prevention discussed Falls evaluation completed   Functional Status Survey:    Vitals:   10/26/22 0916  BP: 128/71  Pulse: 74  Resp: 16  Temp: (!) 97.4 F (36.3 C)  SpO2: 96%  Weight: 154 lb 4 oz (70 kg)  Height: 5\' 4"  (1.626 m)   Body mass index is 26.48 kg/m. Physical Exam Constitutional:      General: She is not in acute distress.    Appearance: Normal appearance.  HENT:     Head: Normocephalic and atraumatic.     Nose: Nose normal.     Mouth/Throat:     Mouth: Mucous membranes are moist.  Eyes:     Conjunctiva/sclera: Conjunctivae normal.  Cardiovascular:     Rate and Rhythm: Normal rate and regular rhythm.  Pulmonary:     Effort: Pulmonary effort is normal.     Breath sounds: Normal breath sounds.  Abdominal:     General: Bowel sounds are normal.     Palpations: Abdomen is soft.  Musculoskeletal:     Cervical back: Normal range of motion.  Skin:    General: Skin is warm and dry.  Neurological:     Mental Status: Mental status is at baseline.     Comments: Alert to self and place, disoriented to time.     Labs reviewed: Recent Labs    05/31/22 0850 10/25/22 0710  NA 139 140  K 3.7 4.0  CL 107 107  CO2 28* 27*  BUN 17 25*  CREATININE 1.2* 1.3*  CALCIUM 9.1 9.4   Recent Labs    05/31/22 0850 10/25/22 0710  AST 18 13   ALT 16 8  ALKPHOS 92 78  ALBUMIN 3.7 3.8   Recent Labs    10/30/21 0000 05/31/22 0850 10/25/22 0710  WBC 5.6 5.7 4.0  NEUTROABS 4,256.00  --  2,748.00  HGB 8.4* 7.9* 8.5*  HCT 28* 26* 27*  PLT 104* 96* 78*   Lab Results  Component Value Date   TSH 3.25 05/31/2022   No results found for: "HGBA1C" Lab Results  Component Value Date   CHOL 125 10/25/2022   HDL 39 10/25/2022   LDLCALC 69 10/25/2022   TRIG 89 10/25/2022   CHOLHDL 3.8 11/23/2018    Significant Diagnostic Results in last 30 days:  No results found.  Assessment/Plan  1. Anxiety -  start Alprazolam 0.25 mg 1 tab PO Q 12 hours PRN -  Monitor behavior  2. Depression, recurrent -  continue Wellbutrin and Sertraline  3. Essential hypertension -  BP 128/71, stable -  continue Losartan   Family/ staff Communication:  Discussed plan of care with charge nurse.  Labs/tests ordered:  None

## 2022-10-29 ENCOUNTER — Encounter: Payer: Self-pay | Admitting: Orthopedic Surgery

## 2022-10-29 ENCOUNTER — Non-Acute Institutional Stay (SKILLED_NURSING_FACILITY): Payer: Medicare Other | Admitting: Orthopedic Surgery

## 2022-10-29 DIAGNOSIS — F411 Generalized anxiety disorder: Secondary | ICD-10-CM | POA: Diagnosis not present

## 2022-10-29 DIAGNOSIS — R300 Dysuria: Secondary | ICD-10-CM

## 2022-10-29 DIAGNOSIS — F0392 Unspecified dementia, unspecified severity, with psychotic disturbance: Secondary | ICD-10-CM | POA: Diagnosis not present

## 2022-10-29 NOTE — Progress Notes (Signed)
Location:  [facility] Room Number: 35 Place of Service:  SNF (31) Provider: Octavia Heir, NP   Patient Care Team: Mahlon Gammon, MD as PCP - General (Internal Medicine) Mast, Man X, NP as Nurse Practitioner (Internal Medicine)  Extended Emergency Contact Information Primary Emergency Contact: Ganci,[name] Address: [address]          [place], [place] [address] [place] Home Phone: [phone] Mobile Phone: [phone] Relation: Son Secondary Emergency Contact: Johnn Hai [place] Home Phone: [phone] Mobile Phone: [phone] Relation: Other  Code Status:  DNR Goals of care: Advanced Directive information    10/29/2022    9:09 AM  Advanced Directives  Does Patient Have a Medical Advance Directive? Yes  Type of Estate agent of [place];Out of facility DNR (pink MOST or yellow form)  Does patient want to make changes to medical advance directive? No - Patient declined  Copy of Healthcare Power of Attorney in Chart? Yes - validated most recent copy scanned in chart (See row information)  Pre-existing out of facility DNR order (yellow form or pink MOST form) Pink MOST/Yellow Form most recent copy in chart - Physician notified to receive inpatient order;Yellow form placed in chart (order not valid for inpatient use)     Chief Complaint  Patient presents with   Acute Visit    Increased confusion     HPI:  Pt is a 89 y.o. female seen today for an acute visit due to increased confusion.   She currently resides on the skilled nursing unit at [facility]. PMH: HTN, HLD, PVD, CKD, esophagitis, GERD, GI bleed, thrombocytopenia, pancytopenia, hypothyroidism, OA, L2 compression fracture, right trimalleolar fracture s/p ORIF 03/2021, chronic back pain, depression and anxiety.   Nursing reports increased calls to family. At times she appears confused or anxious. H/o dementia with hallucinations and  anxiety with depression. She is currently on xanax prn and Zoloft. Today, she is alert to self/person/place and situation. She states she does not feel well. Admits to dysuria yesterday and today. Wears incontinence briefs. Does not always drink fluids well. Afebrile. Vitals stable.   07/2022 urine culture < 50,000 klebsiella oxytoca and morganella morganii  01/2022 diagnosed with acute cystitis- > 100,000 cfu/ML klebsiella pneumoniae 02/16/2022, resolved with Keflex 500 mg TID x 5 days.    Past Medical History:  Diagnosis Date   Anemia    Anxiety    Chronotropic incompetence 12/2012    potentially medication related; noted on CPET    DDD (degenerative disc disease)    Eczema    GERD (gastroesophageal reflux disease)    H/O hiatal hernia    Hx: UTI (urinary tract infection)    Hyperlipidemia    Hypertension    Hypothyroidism    Osteopenia    Septicemia 2002   following UTI   Vertigo, benign positional    Past Surgical History:  Procedure Laterality Date   BREAST SURGERY     left biopsy   CATARACT EXTRACTION Right    2 weeks ago   CPET / MET - PFTS     Consistent with chronotropic incompetence; See attached report in the results section   DILATION AND CURETTAGE OF UTERUS     x 2   DOPPLER ECHOCARDIOGRAPHY  01/10/2013   Normal LV size and function. Normal EF. Air sclerosis but no stenosis   ESOPHAGOGASTRODUODENOSCOPY  05/04/2012   Procedure: ESOPHAGOGASTRODUODENOSCOPY (EGD);  Surgeon: Louis Meckel, MD;  Location: [facility];  Service: Endoscopy;  Laterality: N/A;   EYE SURGERY     cataract extraction with ILO  ? eye   IR KYPHO EA ADDL LEVEL THORACIC OR LUMBAR  10/28/2016   IR KYPHO LUMBAR INC FX REDUCE BONE BX UNI/BIL CANNULATION INC/IMAGING  10/28/2016   IR KYPHO THORACIC WITH BONE BIOPSY  12/24/2016   IR RADIOLOGIST EVAL & MGMT  11/11/2016   KNEE ARTHROSCOPY  2011   right   ORIF ANKLE FRACTURE Right 03/27/2021   Procedure: OPEN REDUCTION INTERNAL FIXATION (ORIF) ANKLE  FRACTURE;  Surgeon: Teryl LucyLandau, Joshua, MD;  Location: [facility];  Service: Orthopedics;  Laterality: Right;   TONSILLECTOMY     TOTAL KNEE ARTHROPLASTY  04/24/2012   Procedure: TOTAL KNEE ARTHROPLASTY;  Surgeon: Loanne DrillingFrank V Aluisio, MD;  Location: [facility];  Service: Orthopedics;  Laterality: Right;   TRANSTHORACIC ECHOCARDIOGRAM  02/2018   Ordered by PCP for "congestive heart failure " -EF 60 M 65%.  GR 1 DD.  No R WMA--- > ESSENTIALLY NORMAL    No Known Allergies  Outpatient Encounter Medications as of 10/29/2022  Medication Sig   acetaminophen (TYLENOL) 500 MG tablet Take 1,000 mg by mouth 3 (three) times daily. For pain   ALPRAZolam (XANAX) 0.25 MG tablet TAKE 1 TABLET BY MOUTH TWICE A DAY AS NEEDED   atorvastatin (LIPITOR) 10 MG tablet Take 5 mg by mouth at bedtime.   buPROPion (WELLBUTRIN SR) 150 MG 12 hr tablet Take 1 tablet (150 mg total) by mouth 2 (two) times daily after a meal.   calcium citrate-vitamin D (CITRACAL+D) 315-200 MG-UNIT tablet Take 1 tablet by mouth 2 (two) times daily.   Camphor-Menthol-Methyl Sal 3.07-31-08 % PTCH Place 1 patch onto the skin See admin instructions. Apply one patch onto the skin of the  lower back daily for back pain   Carboxymethylcellulose Sodium (ARTIFICIAL TEARS OP) Place 1 drop into both eyes in the morning, at noon, and at bedtime.   diclofenac Sodium (VOLTAREN) 1 % GEL Apply 4 g topically in the morning and at bedtime. Apply to bilateral ankles BID   ferrous sulfate 325 (65 FE) MG EC tablet Take 325 mg by mouth every Monday, Wednesday, and Friday. With a meal   gabapentin (NEURONTIN) 100 MG capsule Take 100 mg by mouth 2 (two) times daily.   gabapentin (NEURONTIN) 100 MG capsule Take 200 mg by mouth at bedtime.   hydrocortisone cream 1 % Apply 1 application topically daily as needed for itching.   levothyroxine (SYNTHROID) 75 MCG tablet Take 75 mcg by mouth every morning.   losartan (COZAAR) 25 MG tablet Take 25 mg by mouth every morning.   Menthol, Topical  Analgesic, (BIOFREEZE) 4 % GEL Apply topically 2 (two) times daily as needed. Apply thin layer to left thigh   nystatin (MYCOSTATIN/NYSTOP) powder Apply 1 application  topically as needed (yeast).   nystatin cream (MYCOSTATIN) Apply 1 Application topically as needed.   omega-3 fish oil (MAXEPA) 1000 MG CAPS capsule Take 1 capsule by mouth daily. EVERY MORNING *TAKE WITH FOOD*   omeprazole (PRILOSEC) 40 MG capsule Take 40 mg by mouth every morning.   OxyCODONE HCl, Abuse Deter, (OXAYDO) 5 MG TABA Take 2.5 mg by mouth as needed. Give 30 minutes before transportation to ortho appointments.   OXYGEN Inhale into the lungs. For SOB to keep 02 sats >90%; every shift as needed for SOB   polyethylene glycol (MIRALAX / GLYCOLAX) 17 g packet Take 17 g by mouth as  directed. Every 2 days   potassium chloride SA (KLOR-CON M) 20 MEQ tablet Take 20 mEq by mouth as directed. Monday, Tuesday, Wednesday, Thursday, and Friday.   senna (SENOKOT) 8.6 MG TABS tablet Take 2 tablets by mouth at bedtime.   sertraline (ZOLOFT) 25 MG tablet Take 75 mg by mouth every morning.   torsemide (DEMADEX) 20 MG tablet Take 20 mg by mouth daily as needed. Once A Day on Mon, Tue, Wed, Thu, Fri   zinc oxide 20 % ointment Apply 1 application topically See admin instructions. Apply topically to per/buttocks after every incontinent episode and as needed for redness   No facility-administered encounter medications on file as of 10/29/2022.    Review of Systems  Unable to perform ROS: Dementia    Immunization History  Administered Date(s) Administered   Fluad Quad(high Dose 65+) 05/19/2022   Influenza, High Dose Seasonal PF 04/26/2016, 03/22/2017, 05/08/2019   Influenza,inj,Quad PF,6+ Mos 04/27/2018   Influenza-Unspecified 04/22/2011, 04/04/2012, 05/03/2013, 05/04/2013, 04/17/2014, 04/19/2015, 04/26/2016, 04/22/2017, 05/20/2021   Moderna SARS-COV2 Booster Vaccination 06/09/2020, 12/31/2020   Moderna Sars-Covid-2 Vaccination  07/30/2019, 08/27/2019, 01/06/2022   PFIZER(Purple Top)SARS-COV-2 Vaccination 06/01/2022   Pfizer Covid-19 Vaccine Bivalent Booster 5376yrs & up 04/15/2021   Pneumococcal Polysaccharide-23 08/26/2003, 09/06/2011, 05/15/2019   Pneumococcal-Unspecified 12/20/2013   Td 09/26/2006   Tdap 05/15/2019   Zoster Recombinat (Shingrix) 11/21/2017, 04/08/2018   Zoster, Live 10/25/2007   Pertinent  Health Maintenance Due  Topic Date Due   INFLUENZA VACCINE  02/24/2023   OPHTHALMOLOGY EXAM  03/24/2023   DEXA SCAN  Completed      09/22/2022   12:30 PM 10/06/2022   11:22 AM 10/11/2022   10:15 AM 10/20/2022   10:35 AM 10/26/2022    9:26 AM  Fall Risk  Falls in the past year? 1 1 1 1  0  Was there an injury with Fall? 0 0 0 0 0  Fall Risk Category Calculator 1 2 2 1  0  Patient at Risk for Falls Due to History of fall(s);Impaired balance/gait;Impaired mobility History of fall(s);Impaired balance/gait;Impaired mobility;Mental status change History of fall(s);Impaired balance/gait;Impaired mobility History of fall(s);Impaired balance/gait;Impaired mobility History of fall(s);Impaired balance/gait;Impaired mobility  Fall risk Follow up Falls evaluation completed;Education provided;Falls prevention discussed Falls evaluation completed;Education provided;Falls prevention discussed Falls evaluation completed;Education provided;Falls prevention discussed Falls evaluation completed;Education provided;Falls prevention discussed Falls evaluation completed   Functional Status Survey:    Vitals:   10/29/22 0858  BP: (!) 160/80  Pulse: 71  Temp: (!) 96.6 F (35.9 C)  SpO2: 93%  Weight: 154 lb (69.9 kg)  Height: 5\' 4"  (1.626 m)   Body mass index is 26.43 kg/m. Physical Exam Vitals reviewed.  Constitutional:      General: She is not in acute distress. HENT:     Head: Normocephalic.     Right Ear: There is no impacted cerumen.     Left Ear: There is no impacted cerumen.     Nose: Nose normal.      Mouth/Throat:     Mouth: Mucous membranes are moist.     Pharynx: No posterior oropharyngeal erythema.  Eyes:     General:        Right eye: No discharge.        Left eye: No discharge.  Cardiovascular:     Rate and Rhythm: Normal rate and regular rhythm.     Pulses: Normal pulses.     Heart sounds: Normal heart sounds.  Pulmonary:     Effort: Pulmonary effort is normal.  No respiratory distress.     Breath sounds: Normal breath sounds. No wheezing or rales.  Abdominal:     General: Bowel sounds are normal. There is no distension.     Palpations: Abdomen is soft.     Tenderness: There is no abdominal tenderness. There is no right CVA tenderness or left CVA tenderness.  Musculoskeletal:     Cervical back: Neck supple.     Right lower leg: No edema.     Left lower leg: No edema.  Lymphadenopathy:     Cervical: No cervical adenopathy.  Skin:    General: Skin is warm and dry.     Capillary Refill: Capillary refill takes less than 2 seconds.  Neurological:     General: No focal deficit present.     Mental Status: She is alert. Mental status is at baseline.     Motor: Weakness present.     Gait: Gait abnormal.     Comments: wheelchair  Psychiatric:        Mood and Affect: Mood normal.     Comments: Very pleasant, appropriate today, follows commands     Labs reviewed: Recent Labs    05/31/22 0850 10/25/22 0710  NA 139 140  K 3.7 4.0  CL 107 107  CO2 28* 27*  BUN 17 25*  CREATININE 1.2* 1.3*  CALCIUM 9.1 9.4   Recent Labs    05/31/22 0850 10/25/22 0710  AST 18 13  ALT 16 8  ALKPHOS 92 78  ALBUMIN 3.7 3.8   Recent Labs    10/30/21 0000 05/31/22 0850 10/25/22 0710  WBC 5.6 5.7 4.0  NEUTROABS 4,256.00  --  2,748.00  HGB 8.4* 7.9* 8.5*  HCT 28* 26* 27*  PLT 104* 96* 78*   Lab Results  Component Value Date   TSH 3.25 05/31/2022   No results found for: "HGBA1C" Lab Results  Component Value Date   CHOL 125 10/25/2022   HDL 39 10/25/2022   LDLCALC 69  10/25/2022   TRIG 89 10/25/2022   CHOLHDL 3.8 11/23/2018    Significant Diagnostic Results in last 30 days:  No results found.  Assessment/Plan 1. Dysuria - onset x 1 day - incontinent - does not drink fluids well - UA/culture to r/o UTI  2. Hallucinations due to late onset dementia - nursing reports increased calls to son - appropriate today - sign in room reminding her to call son - ? Increased anxiety versus worsening dementia - see above  3. Generalized anxiety disorder - see above - on xanax prn and Zoloft 75 mg daily - do not recommend high dose Zoloft - also on Wellbutrin  - consider weaning off Zoloft and trying Buspar if symptoms worsen    Family/ staff Communication: plan discussed with patient and nurse  Labs/tests ordered:  UA/culture

## 2022-11-01 ENCOUNTER — Non-Acute Institutional Stay (SKILLED_NURSING_FACILITY): Payer: Medicare Other | Admitting: Orthopedic Surgery

## 2022-11-01 ENCOUNTER — Encounter: Payer: Self-pay | Admitting: Orthopedic Surgery

## 2022-11-01 DIAGNOSIS — F0392 Unspecified dementia, unspecified severity, with psychotic disturbance: Secondary | ICD-10-CM | POA: Diagnosis not present

## 2022-11-01 DIAGNOSIS — N3000 Acute cystitis without hematuria: Secondary | ICD-10-CM

## 2022-11-01 MED ORDER — CIPROFLOXACIN HCL 500 MG PO TABS
500.0000 mg | ORAL_TABLET | Freq: Two times a day (BID) | ORAL | 0 refills | Status: AC
Start: 2022-11-01 — End: 2022-11-06

## 2022-11-01 NOTE — Progress Notes (Signed)
Location:   [facility] Room Number: 35-A Place of Service:  SNF [phone]) Provider:  Hazle Nordmann, NP  PCP: Mahlon Gammon, MD  Patient Care Team: Mahlon Gammon, MD as PCP - General (Internal Medicine) Mast, Man X, NP as Nurse Practitioner (Internal Medicine)  Extended Emergency Contact Information Primary Emergency Contact: Wible,[name] Address: [address]          [place], [place] [address] [place] Home Phone: [phone] Mobile Phone: [phone] Relation: Son Secondary Emergency Contact: Hadden, Cindra [place] Home Phone: [phone] Mobile Phone: [phone] Relation: Other  Code Status:  DNR Goals of care: Advanced Directive information    11/01/2022   10:08 AM  Advanced Directives  Does Patient Have a Medical Advance Directive? Yes  Type of Estate agent of [place];Out of facility DNR (pink MOST or yellow form)  Does patient want to make changes to medical advance directive? No - Patient declined  Copy of Healthcare Power of Attorney in Chart? Yes - validated most recent copy scanned in chart (See row information)     Chief Complaint  Patient presents with   Acute Visit    Increased confusion.     HPI:  Pt is a 89 y.o. female seen today for an acute visit due to increased confusion.   She currently resides on the skilled nursing unit at [facility]. PMH: HTN, HLD, PVD, CKD, esophagitis, GERD, GI bleed, thrombocytopenia, pancytopenia, hypothyroidism, OA, L2 compression fracture, right trimalleolar fracture s/p ORIF 03/2021, chronic back pain, depression and anxiety.   04/05 nursing staff reported increased confusion. She also c/o dysuria.  UA/culture ordered. UA revealed trace leukocytes, WBC 6-10, bacteria many, hyaline cast 0-5. Urine culture revealed > 100,000 CFU/mL klebsiella oxytoca. No behaviors over the weekend. This morning she is alert to self, person and place. She is still having  dysuria this morning. Afebrile. Vital stable.    Past Medical History:  Diagnosis Date   Anemia    Anxiety    Chronotropic incompetence 12/2012    potentially medication related; noted on CPET    DDD (degenerative disc disease)    Eczema    GERD (gastroesophageal reflux disease)    H/O hiatal hernia    Hx: UTI (urinary tract infection)    Hyperlipidemia    Hypertension    Hypothyroidism    Osteopenia    Septicemia 2002   following UTI   Vertigo, benign positional    Past Surgical History:  Procedure Laterality Date   BREAST SURGERY     left biopsy   CATARACT EXTRACTION Right    2 weeks ago   CPET / MET - PFTS     Consistent with chronotropic incompetence; See attached report in the results section   DILATION AND CURETTAGE OF UTERUS     x 2   DOPPLER ECHOCARDIOGRAPHY  01/10/2013   Normal LV size and function. Normal EF. Air sclerosis but no stenosis   ESOPHAGOGASTRODUODENOSCOPY  05/04/2012   Procedure: ESOPHAGOGASTRODUODENOSCOPY (EGD);  Surgeon: Louis Meckel, MD;  Location: [facility];  Service: Endoscopy;  Laterality: N/A;   EYE SURGERY     cataract extraction with ILO  ? eye   IR KYPHO EA ADDL LEVEL THORACIC OR LUMBAR  10/28/2016   IR KYPHO LUMBAR INC FX REDUCE BONE BX UNI/BIL CANNULATION INC/IMAGING  10/28/2016   IR KYPHO THORACIC WITH BONE BIOPSY  12/24/2016   IR RADIOLOGIST EVAL & MGMT  11/11/2016  KNEE ARTHROSCOPY  2011   right   ORIF ANKLE FRACTURE Right 03/27/2021   Procedure: OPEN REDUCTION INTERNAL FIXATION (ORIF) ANKLE FRACTURE;  Surgeon: Teryl LucyLandau, Joshua, MD;  Location: [facility];  Service: Orthopedics;  Laterality: Right;   TONSILLECTOMY     TOTAL KNEE ARTHROPLASTY  04/24/2012   Procedure: TOTAL KNEE ARTHROPLASTY;  Surgeon: Loanne DrillingFrank V Aluisio, MD;  Location: [facility];  Service: Orthopedics;  Laterality: Right;   TRANSTHORACIC ECHOCARDIOGRAM  02/2018   Ordered by PCP for "congestive heart failure " -EF 60 M 65%.  GR 1 DD.  No R WMA--- > ESSENTIALLY NORMAL    No Known  Allergies  Allergies as of 11/01/2022   No Known Allergies      Medication List        Accurate as of November 01, 2022 10:09 AM. If you have any questions, ask your nurse or doctor.          STOP taking these medications    hydrocortisone cream 1 % Stopped by: Octavia HeirAmy E Particia Strahm, NP       TAKE these medications    acetaminophen 500 MG tablet Commonly known as: TYLENOL Take 1,000 mg by mouth 3 (three) times daily. For pain   ALPRAZolam 0.25 MG tablet Commonly known as: XANAX TAKE 1 TABLET BY MOUTH TWICE A DAY AS NEEDED   ARTIFICIAL TEARS OP Place 1 drop into both eyes in the morning, at noon, and at bedtime.   atorvastatin 10 MG tablet Commonly known as: LIPITOR Take 5 mg by mouth at bedtime.   Biofreeze 4 % Gel Generic drug: Menthol (Topical Analgesic) Apply topically 2 (two) times daily as needed. Apply thin layer to left thigh   buPROPion 150 MG 12 hr tablet Commonly known as: WELLBUTRIN SR Take 1 tablet (150 mg total) by mouth 2 (two) times daily after a meal.   calcium citrate-vitamin D 315-200 MG-UNIT tablet Commonly known as: CITRACAL+D Take 1 tablet by mouth 2 (two) times daily.   Camphor-Menthol-Methyl Sal 3.07-31-08 % Ptch Place 1 patch onto the skin See admin instructions. Apply one patch onto the skin of the  lower back daily for back pain   diclofenac Sodium 1 % Gel Commonly known as: VOLTAREN Apply 4 g topically in the morning and at bedtime. Apply to bilateral ankles BID   ferrous sulfate 325 (65 FE) MG EC tablet Take 325 mg by mouth every Monday, Wednesday, and Friday. With a meal   gabapentin 100 MG capsule Commonly known as: NEURONTIN Take 100 mg by mouth 2 (two) times daily.   gabapentin 100 MG capsule Commonly known as: NEURONTIN Take 200 mg by mouth at bedtime.   levothyroxine 75 MCG tablet Commonly known as: SYNTHROID Take 75 mcg by mouth every morning.   losartan 25 MG tablet Commonly known as: COZAAR Take 25 mg by mouth every  morning.   nystatin powder Commonly known as: MYCOSTATIN/NYSTOP Apply 1 application  topically as needed (yeast).   nystatin cream Commonly known as: MYCOSTATIN Apply 1 Application topically as needed.   omega-3 fish oil 1000 MG Caps capsule Commonly known as: MAXEPA Take 1 capsule by mouth daily. EVERY MORNING *TAKE WITH FOOD*   omeprazole 40 MG capsule Commonly known as: PRILOSEC Take 40 mg by mouth every morning.   OxyCODONE HCl (Abuse Deter) 5 MG Taba Commonly known as: OXAYDO Take 2.5 mg by mouth as needed. Give 30 minutes before transportation to ortho appointments.   OXYGEN Inhale into the lungs. For SOB to  keep 02 sats >90%; every shift as needed for SOB   polyethylene glycol 17 g packet Commonly known as: MIRALAX / GLYCOLAX Take 17 g by mouth as directed. Every 2 days   potassium chloride SA 20 MEQ tablet Commonly known as: KLOR-CON M Take 20 mEq by mouth as directed. Monday, Tuesday, Wednesday, Thursday, and Friday.   senna 8.6 MG Tabs tablet Commonly known as: SENOKOT Take 2 tablets by mouth at bedtime.   sertraline 25 MG tablet Commonly known as: ZOLOFT Take 75 mg by mouth every morning.   torsemide 20 MG tablet Commonly known as: DEMADEX Take 20 mg by mouth as directed. Once A Day on Mon, Tue, Wed, Thu, Fri   zinc oxide 20 % ointment Apply 1 application topically See admin instructions. Apply topically to per/buttocks after every incontinent episode and as needed for redness        Review of Systems  Constitutional:  Negative for activity change and appetite change.  HENT:  Negative for congestion and trouble swallowing.   Eyes:  Negative for visual disturbance.  Respiratory:  Negative for cough, shortness of breath and wheezing.   Cardiovascular:  Negative for chest pain and leg swelling.  Gastrointestinal:  Negative for abdominal distention and abdominal pain.  Genitourinary:  Positive for dysuria and frequency. Negative for hematuria.   Musculoskeletal:  Positive for gait problem.  Skin:  Negative for wound.  Neurological:  Positive for weakness. Negative for dizziness and headaches.  Psychiatric/Behavioral:  Positive for confusion and dysphoric mood. Negative for sleep disturbance. The patient is nervous/anxious.     Immunization History  Administered Date(s) Administered   Fluad Quad(high Dose 65+) 05/19/2022   Influenza, High Dose Seasonal PF 04/26/2016, 03/22/2017, 05/08/2019   Influenza,inj,Quad PF,6+ Mos 04/27/2018   Influenza-Unspecified 04/22/2011, 04/04/2012, 05/03/2013, 05/04/2013, 04/17/2014, 04/19/2015, 04/26/2016, 04/22/2017, 05/20/2021   Moderna SARS-COV2 Booster Vaccination 06/09/2020, 12/31/2020   Moderna Sars-Covid-2 Vaccination 07/30/2019, 08/27/2019, 01/06/2022   PFIZER(Purple Top)SARS-COV-2 Vaccination 06/01/2022   Pfizer Covid-19 Vaccine Bivalent Booster 43yrs & up 04/15/2021   Pneumococcal Polysaccharide-23 08/26/2003, 09/06/2011, 05/15/2019   Pneumococcal-Unspecified 12/20/2013   Td 09/26/2006   Tdap 05/15/2019   Zoster Recombinat (Shingrix) 11/21/2017, 04/08/2018   Zoster, Live 10/25/2007   Pertinent  Health Maintenance Due  Topic Date Due   INFLUENZA VACCINE  02/24/2023   OPHTHALMOLOGY EXAM  03/24/2023   DEXA SCAN  Completed      09/22/2022   12:30 PM 10/06/2022   11:22 AM 10/11/2022   10:15 AM 10/20/2022   10:35 AM 10/26/2022    9:26 AM  Fall Risk  Falls in the past year? 1 1 1 1  0  Was there an injury with Fall? 0 0 0 0 0  Fall Risk Category Calculator 1 2 2 1  0  Patient at Risk for Falls Due to History of fall(s);Impaired balance/gait;Impaired mobility History of fall(s);Impaired balance/gait;Impaired mobility;Mental status change History of fall(s);Impaired balance/gait;Impaired mobility History of fall(s);Impaired balance/gait;Impaired mobility History of fall(s);Impaired balance/gait;Impaired mobility  Fall risk Follow up Falls evaluation completed;Education provided;Falls  prevention discussed Falls evaluation completed;Education provided;Falls prevention discussed Falls evaluation completed;Education provided;Falls prevention discussed Falls evaluation completed;Education provided;Falls prevention discussed Falls evaluation completed   Functional Status Survey:    Vitals:   11/01/22 1001  BP: (!) 160/80  Pulse: 71  Resp: 18  Temp: (!) 96.6 F (35.9 C)  SpO2: 93%  Weight: 154 lb 6.4 oz (70 kg)  Height: 5\' 4"  (1.626 m)   Body mass index is 26.5 kg/m. Physical Exam Vitals  reviewed.  Constitutional:      General: She is not in acute distress. HENT:     Head: Normocephalic.  Eyes:     General:        Right eye: No discharge.        Left eye: No discharge.  Cardiovascular:     Rate and Rhythm: Normal rate and regular rhythm.     Pulses: Normal pulses.     Heart sounds: Normal heart sounds.  Pulmonary:     Breath sounds: Normal breath sounds.  Abdominal:     General: Bowel sounds are normal.     Palpations: Abdomen is soft.  Musculoskeletal:     Cervical back: Neck supple.     Right lower leg: No edema.     Left lower leg: No edema.  Skin:    General: Skin is warm and dry.     Capillary Refill: Capillary refill takes less than 2 seconds.  Neurological:     General: No focal deficit present.     Mental Status: She is alert. Mental status is at baseline.     Motor: Weakness present.     Gait: Gait abnormal.  Psychiatric:        Mood and Affect: Mood normal.     Labs reviewed: Recent Labs    05/31/22 0850 10/25/22 0710  NA 139 140  K 3.7 4.0  CL 107 107  CO2 28* 27*  BUN 17 25*  CREATININE 1.2* 1.3*  CALCIUM 9.1 9.4   Recent Labs    05/31/22 0850 10/25/22 0710  AST 18 13  ALT 16 8  ALKPHOS 92 78  ALBUMIN 3.7 3.8   Recent Labs    05/31/22 0850 10/25/22 0710  WBC 5.7 4.0  NEUTROABS  --  2,748.00  HGB 7.9* 8.5*  HCT 26* 27*  PLT 96* 78*   Lab Results  Component Value Date   TSH 3.25 05/31/2022   No results  found for: "HGBA1C" Lab Results  Component Value Date   CHOL 125 10/25/2022   HDL 39 10/25/2022   LDLCALC 69 10/25/2022   TRIG 89 10/25/2022   CHOLHDL 3.8 11/23/2018    Significant Diagnostic Results in last 30 days:  No results found.  Assessment/Plan 1. Acute cystitis without hematuria - 04/05 increased confusion and dysuria - UA>UA revealed trace leukocytes, WBC 6-10, bacteria many, hyaline cast 0-5 - Urine culture > 100,000 CFU/mL klebsiella oxytoca - ciprofloxacin (CIPRO) 500 MG tablet; Take 1 tablet (500 mg total) by mouth 2 (two) times daily for 5 days.  Dispense: 10 tablet; Refill: 0 - encourage hydration with water  2. Hallucinations due to late onset dementia - see above - at baseline today - cont xanax prn for behaviors    Family/ staff Communication: plan discussed with patient and nurse  Labs/tests ordered:  none

## 2022-11-18 ENCOUNTER — Encounter: Payer: Self-pay | Admitting: Internal Medicine

## 2022-11-18 NOTE — Progress Notes (Signed)
Location:  [facility] Room Number: 35A Place of Service:  SNF [phone]) Provider:  Mahlon Gammon, MD   Mahlon Gammon, MD  Patient Care Team: Mahlon Gammon, MD as PCP - General (Internal Medicine) Mast, Man X, NP as Nurse Practitioner (Internal Medicine)  Extended Emergency Contact Information Primary Emergency Contact: Haaland,[name] Address: [address]          [place], [place] [address] [place] Home Phone: [phone] Mobile Phone: [phone] Relation: Son Secondary Emergency Contact: Breese, Cindra [place] Home Phone: [phone] Mobile Phone: [phone] Relation: Other  Code Status:  DNR Goals of care: Advanced Directive information    11/18/2022    3:00 PM  Advanced Directives  Does Patient Have a Medical Advance Directive? Yes  Type of Estate agent of [place];Out of facility DNR (pink MOST or yellow form)  Does patient want to make changes to medical advance directive? No - Patient declined  Copy of Healthcare Power of Attorney in Chart? Yes - validated most recent copy scanned in chart (See row information)     Chief Complaint  Patient presents with   Medical Management of Chronic Issues    Patient being seen for a Routine Visit     HPI:  Pt is a 89 y.o. female seen today for medical management of chronic diseases.     Past Medical History:  Diagnosis Date   Anemia    Anxiety    Chronotropic incompetence 12/2012    potentially medication related; noted on CPET    DDD (degenerative disc disease)    Eczema    GERD (gastroesophageal reflux disease)    H/O hiatal hernia    Hx: UTI (urinary tract infection)    Hyperlipidemia    Hypertension    Hypothyroidism    Osteopenia    Septicemia 2002   following UTI   Vertigo, benign positional    Past Surgical History:  Procedure Laterality Date   BREAST SURGERY     left biopsy   CATARACT EXTRACTION Right    2 weeks ago   CPET /  MET - PFTS     Consistent with chronotropic incompetence; See attached report in the results section   DILATION AND CURETTAGE OF UTERUS     x 2   DOPPLER ECHOCARDIOGRAPHY  01/10/2013   Normal LV size and function. Normal EF. Air sclerosis but no stenosis   ESOPHAGOGASTRODUODENOSCOPY  05/04/2012   Procedure: ESOPHAGOGASTRODUODENOSCOPY (EGD);  Surgeon: Louis Meckel, MD;  Location: [facility];  Service: Endoscopy;  Laterality: N/A;   EYE SURGERY     cataract extraction with ILO  ? eye   IR KYPHO EA ADDL LEVEL THORACIC OR LUMBAR  10/28/2016   IR KYPHO LUMBAR INC FX REDUCE BONE BX UNI/BIL CANNULATION INC/IMAGING  10/28/2016   IR KYPHO THORACIC WITH BONE BIOPSY  12/24/2016   IR RADIOLOGIST EVAL & MGMT  11/11/2016   KNEE ARTHROSCOPY  2011   right   ORIF ANKLE FRACTURE Right 03/27/2021   Procedure: OPEN REDUCTION INTERNAL FIXATION (ORIF) ANKLE FRACTURE;  Surgeon: Teryl Lucy, MD;  Location: [facility];  Service: Orthopedics;  Laterality: Right;   TONSILLECTOMY     TOTAL KNEE ARTHROPLASTY  04/24/2012   Procedure: TOTAL KNEE ARTHROPLASTY;  Surgeon: Loanne Drilling, MD;  Location: [facility];  Service: Orthopedics;  Laterality: Right;   TRANSTHORACIC ECHOCARDIOGRAM  02/2018   Ordered by PCP for "congestive heart failure " -EF 60 M  65%.  GR 1 DD.  No R WMA--- > ESSENTIALLY NORMAL    No Known Allergies  Outpatient Encounter Medications as of 11/18/2022  Medication Sig   acetaminophen (TYLENOL) 500 MG tablet Take 1,000 mg by mouth 3 (three) times daily. For pain   ALPRAZolam (XANAX) 0.25 MG tablet TAKE 1 TABLET BY MOUTH TWICE A DAY AS NEEDED   atorvastatin (LIPITOR) 10 MG tablet Take 5 mg by mouth at bedtime.   buPROPion (WELLBUTRIN SR) 150 MG 12 hr tablet Take 1 tablet (150 mg total) by mouth 2 (two) times daily after a meal.   calcium citrate-vitamin D (CITRACAL+D) 315-200 MG-UNIT tablet Take 1 tablet by mouth 2 (two) times daily.   Camphor-Menthol-Methyl Sal 3.07-31-08 % PTCH Place 1 patch onto the skin  See admin instructions. Apply one patch onto the skin of the  lower back daily for back pain   Carboxymethylcellulose Sodium (ARTIFICIAL TEARS OP) Place 1 drop into both eyes in the morning, at noon, and at bedtime.   diclofenac Sodium (VOLTAREN) 1 % GEL Apply 4 g topically in the morning and at bedtime. Apply to bilateral ankles BID   ferrous sulfate 325 (65 FE) MG EC tablet Take 325 mg by mouth every Monday, Wednesday, and Friday. With a meal   gabapentin (NEURONTIN) 100 MG capsule Take 100 mg by mouth 2 (two) times daily.   gabapentin (NEURONTIN) 100 MG capsule Take 200 mg by mouth at bedtime.   hydrocortisone cream 1 % Apply 1 Application topically as needed for itching. Apply to skin topically as needed for itching   levothyroxine (SYNTHROID) 75 MCG tablet Take 75 mcg by mouth every morning.   losartan (COZAAR) 25 MG tablet Take 25 mg by mouth every morning.   Menthol, Topical Analgesic, (BIOFREEZE) 4 % GEL Apply topically 2 (two) times daily as needed. Apply thin layer to left thigh   nystatin (MYCOSTATIN/NYSTOP) powder Apply 1 application  topically as needed (yeast).   nystatin cream (MYCOSTATIN) Apply 1 Application topically as needed.   omega-3 fish oil (MAXEPA) 1000 MG CAPS capsule Take 1 capsule by mouth daily. EVERY MORNING *TAKE WITH FOOD*   omeprazole (PRILOSEC) 40 MG capsule Take 40 mg by mouth every morning.   OxyCODONE HCl, Abuse Deter, (OXAYDO) 5 MG TABA Take 2.5 mg by mouth as needed. Give 30 minutes before transportation to ortho appointments.   OXYGEN Inhale into the lungs. For SOB to keep 02 sats >90%; every shift as needed for SOB   polyethylene glycol (MIRALAX / GLYCOLAX) 17 g packet Take 17 g by mouth as directed. Every 2 days   potassium chloride SA (KLOR-CON M) 20 MEQ tablet Take 20 mEq by mouth as directed. Monday, Tuesday, Wednesday, Thursday, and Friday.   senna (SENOKOT) 8.6 MG TABS tablet Take 2 tablets by mouth at bedtime.   sertraline (ZOLOFT) 25 MG tablet Take  75 mg by mouth every morning.   torsemide (DEMADEX) 20 MG tablet Take 20 mg by mouth as directed. Once A Day on Mon, Tue, Wed, Thu, Fri   zinc oxide 20 % ointment Apply 1 application topically See admin instructions. Apply topically to per/buttocks after every incontinent episode and as needed for redness   No facility-administered encounter medications on file as of 11/18/2022.    Review of Systems  Immunization History  Administered Date(s) Administered   Fluad Quad(high Dose 65+) 05/19/2022   Influenza, High Dose Seasonal PF 04/26/2016, 03/22/2017, 05/08/2019   Influenza,inj,Quad PF,6+ Mos 04/27/2018   Influenza-Unspecified 04/22/2011,  04/04/2012, 05/03/2013, 05/04/2013, 04/17/2014, 04/19/2015, 04/26/2016, 04/22/2017, 05/20/2021   Moderna SARS-COV2 Booster Vaccination 06/09/2020, 12/31/2020   Moderna Sars-Covid-2 Vaccination 07/30/2019, 08/27/2019, 01/06/2022   PFIZER(Purple Top)SARS-COV-2 Vaccination 06/01/2022   Pfizer Covid-19 Vaccine Bivalent Booster 63yrs & up 04/15/2021   Pneumococcal Polysaccharide-23 08/26/2003, 09/06/2011, 05/15/2019   Pneumococcal-Unspecified 12/20/2013   Td 09/26/2006   Tdap 05/15/2019   Zoster Recombinat (Shingrix) 11/21/2017, 04/08/2018   Zoster, Live 10/25/2007   Pertinent  Health Maintenance Due  Topic Date Due   INFLUENZA VACCINE  02/24/2023   OPHTHALMOLOGY EXAM  03/24/2023   DEXA SCAN  Completed      09/22/2022   12:30 PM 10/06/2022   11:22 AM 10/11/2022   10:15 AM 10/20/2022   10:35 AM 10/26/2022    9:26 AM  Fall Risk  Falls in the past year? 0  Was there an injury with Fall? 0 0 0 0 0  Fall Risk Category Calculator 0  Patient at Risk for Falls Due to History of fall(s);Impaired balance/gait;Impaired mobility History of fall(s);Impaired balance/gait;Impaired mobility;Mental status change History of fall(s);Impaired balance/gait;Impaired mobility History of fall(s);Impaired balance/gait;Impaired mobility History of  fall(s);Impaired balance/gait;Impaired mobility  Fall risk Follow up Falls evaluation completed;Education provided;Falls prevention discussed Falls evaluation completed;Education provided;Falls prevention discussed Falls evaluation completed;Education provided;Falls prevention discussed Falls evaluation completed;Education provided;Falls prevention discussed Falls evaluation completed   Functional Status Survey:    Vitals:   11/18/22 1451  BP: 136/70  Pulse: 68  Resp: 18  Temp: (!) 97.1 F (36.2 C)  TempSrc: Temporal  SpO2: 93%  Weight: 154 lb 6.4 oz (70 kg)  Height:  (1.626 m)   Body mass index is 26.5 kg/m. Physical Exam  Labs reviewed: Recent Labs    05/31/22 0850 10/25/22 0710  NA 139 140  K 3.7 4.0  CL 107 107  CO2 28* 27*  BUN 17 25*  CREATININE 1.2* 1.3*  CALCIUM 9.1 9.4   Recent Labs    05/31/22 0850 10/25/22 0710  AST 18 13  ALT 16 8  ALKPHOS 92 78  ALBUMIN 3.7 3.8   Recent Labs    05/31/22 0850 10/25/22 0710  WBC 5.7 4.0  NEUTROABS  --  2,748.00  HGB 7.9* 8.5*  HCT 26* 27*  PLT 96* 78*   Lab Results  Component Value Date   TSH 3.25 05/31/2022   No results found for: "HGBA1C" Lab Results  Component Value Date   CHOL 125 10/25/2022   HDL 39 10/25/2022   LDLCALC 69 10/25/2022   TRIG 89 10/25/2022   CHOLHDL 3.8 11/23/2018    Significant Diagnostic Results in last 30 days:  No results found.  Assessment/Plan There are no diagnoses linked to this encounter.   Family/ staff Communication: ***  Labs/tests ordered:  ***

## 2022-11-19 ENCOUNTER — Non-Acute Institutional Stay (SKILLED_NURSING_FACILITY): Payer: Medicare Other | Admitting: Orthopedic Surgery

## 2022-11-19 ENCOUNTER — Encounter: Payer: Self-pay | Admitting: Orthopedic Surgery

## 2022-11-19 DIAGNOSIS — E039 Hypothyroidism, unspecified: Secondary | ICD-10-CM | POA: Diagnosis not present

## 2022-11-19 DIAGNOSIS — K219 Gastro-esophageal reflux disease without esophagitis: Secondary | ICD-10-CM | POA: Diagnosis not present

## 2022-11-19 DIAGNOSIS — H6123 Impacted cerumen, bilateral: Secondary | ICD-10-CM

## 2022-11-19 DIAGNOSIS — M545 Low back pain, unspecified: Secondary | ICD-10-CM | POA: Diagnosis not present

## 2022-11-19 DIAGNOSIS — N1831 Chronic kidney disease, stage 3a: Secondary | ICD-10-CM | POA: Diagnosis not present

## 2022-11-19 DIAGNOSIS — N3000 Acute cystitis without hematuria: Secondary | ICD-10-CM

## 2022-11-19 DIAGNOSIS — I10 Essential (primary) hypertension: Secondary | ICD-10-CM

## 2022-11-19 DIAGNOSIS — I5189 Other ill-defined heart diseases: Secondary | ICD-10-CM

## 2022-11-19 DIAGNOSIS — F0392 Unspecified dementia, unspecified severity, with psychotic disturbance: Secondary | ICD-10-CM

## 2022-11-19 DIAGNOSIS — F411 Generalized anxiety disorder: Secondary | ICD-10-CM

## 2022-11-19 DIAGNOSIS — E782 Mixed hyperlipidemia: Secondary | ICD-10-CM

## 2022-11-19 DIAGNOSIS — R2681 Unsteadiness on feet: Secondary | ICD-10-CM

## 2022-11-19 DIAGNOSIS — F339 Major depressive disorder, recurrent, unspecified: Secondary | ICD-10-CM

## 2022-11-19 DIAGNOSIS — G8929 Other chronic pain: Secondary | ICD-10-CM

## 2022-11-19 NOTE — Progress Notes (Signed)
Location:   [facility] Room Number: 35-A Place of Service:  SNF [phone]) Provider:  Hazle Nordmann, NP  PCP: Mahlon Gammon, MD  Patient Care Team: Mahlon Gammon, MD as PCP - General (Internal Medicine) Mast, Man X, NP as Nurse Practitioner (Internal Medicine)  Extended Emergency Contact Information Primary Emergency Contact: Urick,[name] Address: [address]          [place], [place] [address] [place] Home Phone: [phone] Mobile Phone: [phone] Relation: Son Secondary Emergency Contact: [place], [place] Home Phone: [phone] Mobile Phone: [phone] Relation: Other  Code Status:  DNR Goals of care: Advanced Directive information    11/19/2022   11:33 AM  Advanced Directives  Does Patient Have a Medical Advance Directive? Yes  Type of Estate agent of [place];Out of facility DNR (pink MOST or yellow form)  Does patient want to make changes to medical advance directive? No - Patient declined  Copy of Healthcare Power of Attorney in Chart? Yes - validated most recent copy scanned in chart (See row information)     Chief Complaint  Patient presents with   Medical Management of Chronic Issues    Routine Visit.     HPI:  Pt is a 89 y.o. female seen today for medical management of chronic diseases.    She currently resides on the skilled nursing unit at [facility]. PMH: HTN, HLD, PVD, CKD, esophagitis, GERD, GI bleed, thrombocytopenia, pancytopenia, hypothyroidism, OA, L2 compression fracture, right trimalleolar fracture s/p ORIF 03/2021, chronic back pain, depression and anxiety.    Acute cystitis- 04/05 urine culture due to increased confusion/dysuria>  100,000 CFU/mL klebsiella oxytoca, resolved with Cipro x 5 days HTN- BUN/creat 25/1.3 10/25/2022, remains on losartan HLD- LDL 69 10/25/2022, remains on Lipitor CKD- see above Diastolic dysfunction- BNP 289 40/04/2724, Echo 03/18  noted normal LV systolic function and mild LV hypertrophy, some sob with exertion, remains in torsemide M-F Hallucinations with dementia- BIMS score 15/15 (02/26)> was 11/15 (11/23), CT head noted chronic microvascular ischemic disease 04/16/2022, intermittent confusion with hallucinations, remains on xanax prn Hypothyroidism- TSH 3.25 05/31/2022, remains on levothyroxine GERD- hgb 8.5 10/25/2022, remains on Prilosec Depression with anxiety- no mood changes, Na+ 140 10/25/2022, remains on Wellbutrin, Zoloft and xanax prn Chronic back pain- H/o chronic compression fractures to T11, L1 and L2, remains on tylenol/gabapentin/voltaren gel/oxycodone Unstable gait- ambulates mainly with wheelchair, 1+ assist with walker  Past Surgical History:  Procedure Laterality Date   BREAST SURGERY     left biopsy   CATARACT EXTRACTION Right    2 weeks ago   CPET / MET - PFTS     Consistent with chronotropic incompetence; See attached report in the results section   DILATION AND CURETTAGE OF UTERUS     x 2   DOPPLER ECHOCARDIOGRAPHY  01/10/2013   Normal LV size and function. Normal EF. Air sclerosis but no stenosis   ESOPHAGOGASTRODUODENOSCOPY  05/04/2012   Procedure: ESOPHAGOGASTRODUODENOSCOPY (EGD);  Surgeon: Louis Meckel, MD;  Location: [facility];  Service: Endoscopy;  Laterality: N/A;   EYE SURGERY     cataract extraction with ILO  ? eye   IR KYPHO EA ADDL LEVEL THORACIC OR LUMBAR  10/28/2016   IR KYPHO LUMBAR INC FX REDUCE BONE BX UNI/BIL CANNULATION INC/IMAGING  10/28/2016   IR KYPHO THORACIC WITH BONE BIOPSY  12/24/2016   IR RADIOLOGIST EVAL & MGMT  11/11/2016   KNEE ARTHROSCOPY  2011  right   ORIF ANKLE FRACTURE Right 03/27/2021   Procedure: OPEN REDUCTION INTERNAL FIXATION (ORIF) ANKLE FRACTURE;  Surgeon: Teryl Lucy, MD;  Location: [facility];  Service: Orthopedics;  Laterality: Right;   TONSILLECTOMY     TOTAL KNEE ARTHROPLASTY  04/24/2012   Procedure: TOTAL KNEE ARTHROPLASTY;  Surgeon: Loanne Drilling, MD;  Location: [facility];  Service: Orthopedics;  Laterality: Right;   TRANSTHORACIC ECHOCARDIOGRAM  02/2018   Ordered by PCP for "congestive heart failure " -EF 60 M 65%.  GR 1 DD.  No R WMA--- > ESSENTIALLY NORMAL    No Known Allergies  Allergies as of 11/19/2022   No Known Allergies      Medication List        Accurate as of November 19, 2022 11:33 AM. If you have any questions, ask your nurse or doctor.          acetaminophen 500 MG tablet Commonly known as: TYLENOL Take 1,000 mg by mouth 3 (three) times daily. For pain   ALPRAZolam 0.25 MG tablet Commonly known as: XANAX TAKE 1 TABLET BY MOUTH TWICE A DAY AS NEEDED   ARTIFICIAL TEARS OP Place 1 drop into both eyes in the morning, at noon, and at bedtime.   atorvastatin 10 MG tablet Commonly known as: LIPITOR Take 5 mg by mouth at bedtime.   Biofreeze 4 % Gel Generic drug: Menthol (Topical Analgesic) Apply topically 2 (two) times daily as needed. Apply thin layer to left thigh   buPROPion 150 MG 12 hr tablet Commonly known as: WELLBUTRIN SR Take 1 tablet (150 mg total) by mouth 2 (two) times daily after a meal.   calcium citrate-vitamin D 315-200 MG-UNIT tablet Commonly known as: CITRACAL+D Take 1 tablet by mouth 2 (two) times daily.   Camphor-Menthol-Methyl Sal 3.07-31-08 % Ptch Place 1 patch onto the skin See admin instructions. Apply one patch onto the skin of the  lower back daily for back pain   diclofenac Sodium 1 % Gel Commonly known as: VOLTAREN Apply 4 g topically in the morning and at bedtime. Apply to bilateral ankles BID   ferrous sulfate 325 (65 FE) MG EC tablet Take 325 mg by mouth every Monday, Wednesday, and Friday. With a meal   gabapentin 100 MG capsule Commonly known as: NEURONTIN Take 100 mg by mouth 2 (two) times daily.   gabapentin 100 MG capsule Commonly known as: NEURONTIN Take 200 mg by mouth at bedtime.   hydrocortisone cream 1 % Apply 1 Application topically as needed  for itching. Apply to skin topically as needed for itching   levothyroxine 75 MCG tablet Commonly known as: SYNTHROID Take 75 mcg by mouth every morning.   losartan 25 MG tablet Commonly known as: COZAAR Take 25 mg by mouth every morning.   nystatin powder Commonly known as: MYCOSTATIN/NYSTOP Apply 1 application  topically as needed (yeast).   nystatin cream Commonly known as: MYCOSTATIN Apply 1 Application topically as needed.   omega-3 fish oil 1000 MG Caps capsule Commonly known as: MAXEPA Take 1 capsule by mouth daily. EVERY MORNING *TAKE WITH FOOD*   omeprazole 40 MG capsule Commonly known as: PRILOSEC Take 40 mg by mouth every morning.   OxyCODONE HCl (Abuse Deter) 5 MG Taba Commonly known as: OXAYDO Take 2.5 mg by mouth as needed. Give 30 minutes before transportation to ortho appointments.   OXYGEN Inhale into the lungs. For SOB to keep 02 sats >90%; every shift as needed for SOB   polyethylene  glycol 17 g packet Commonly known as: MIRALAX / GLYCOLAX Take 17 g by mouth as directed. Every 2 days   potassium chloride SA 20 MEQ tablet Commonly known as: KLOR-CON M Take 20 mEq by mouth as directed. Monday, Tuesday, Wednesday, Thursday, and Friday.   senna 8.6 MG Tabs tablet Commonly known as: SENOKOT Take 2 tablets by mouth at bedtime.   sertraline 25 MG tablet Commonly known as: ZOLOFT Take 75 mg by mouth every morning.   torsemide 20 MG tablet Commonly known as: DEMADEX Take 20 mg by mouth as directed. Once A Day on Mon, Tue, Wed, Thu, Fri   zinc oxide 20 % ointment Apply 1 application topically See admin instructions. Apply topically to per/buttocks after every incontinent episode and as needed for redness        Review of Systems  Unable to perform ROS: Dementia    Immunization History  Administered Date(s) Administered   Fluad Quad(high Dose 65+) 05/19/2022   Influenza, High Dose Seasonal PF 04/26/2016, 03/22/2017, 05/08/2019    Influenza,inj,Quad PF,6+ Mos 04/27/2018   Influenza-Unspecified 04/22/2011, 04/04/2012, 05/03/2013, 05/04/2013, 04/17/2014, 04/19/2015, 04/26/2016, 04/22/2017, 05/20/2021   Moderna SARS-COV2 Booster Vaccination 06/09/2020, 12/31/2020   Moderna Sars-Covid-2 Vaccination 07/30/2019, 08/27/2019, 01/06/2022   PFIZER(Purple Top)SARS-COV-2 Vaccination 06/01/2022   Pfizer Covid-19 Vaccine Bivalent Booster 23yrs & up 04/15/2021   Pneumococcal Polysaccharide-23 08/26/2003, 09/06/2011, 05/15/2019   Pneumococcal-Unspecified 12/20/2013   Td 09/26/2006   Tdap 05/15/2019   Zoster Recombinat (Shingrix) 11/21/2017, 04/08/2018   Zoster, Live 10/25/2007   Pertinent  Health Maintenance Due  Topic Date Due   INFLUENZA VACCINE  02/24/2023   OPHTHALMOLOGY EXAM  03/24/2023   DEXA SCAN  Completed      09/22/2022   12:30 PM 10/06/2022   11:22 AM 10/11/2022   10:15 AM 10/20/2022   10:35 AM 10/26/2022    9:26 AM  Fall Risk  Falls in the past year? 1 1 1 1  0  Was there an injury with Fall? 0 0 0 0 0  Fall Risk Category Calculator 1 2 2 1  0  Patient at Risk for Falls Due to History of fall(s);Impaired balance/gait;Impaired mobility History of fall(s);Impaired balance/gait;Impaired mobility;Mental status change History of fall(s);Impaired balance/gait;Impaired mobility History of fall(s);Impaired balance/gait;Impaired mobility History of fall(s);Impaired balance/gait;Impaired mobility  Fall risk Follow up Falls evaluation completed;Education provided;Falls prevention discussed Falls evaluation completed;Education provided;Falls prevention discussed Falls evaluation completed;Education provided;Falls prevention discussed Falls evaluation completed;Education provided;Falls prevention discussed Falls evaluation completed   Functional Status Survey:    Vitals:   11/19/22 1117  BP: 136/70  Pulse: 68  Resp: 18  Temp: (!) 97.1 F (36.2 C)  SpO2: 93%  Weight: 154 lb 6.4 oz (70 kg)  Height: 5\' 4"  (1.626 m)   Body  mass index is 26.5 kg/m. Physical Exam Vitals reviewed.  Constitutional:      General: She is not in acute distress. HENT:     Head: Normocephalic.     Right Ear: There is impacted cerumen.     Left Ear: There is impacted cerumen.     Nose: Nose normal.     Mouth/Throat:     Mouth: Mucous membranes are moist.  Eyes:     General:        Right eye: No discharge.        Left eye: No discharge.  Cardiovascular:     Rate and Rhythm: Normal rate and regular rhythm.     Pulses: Normal pulses.     Heart sounds: Normal  heart sounds.  Pulmonary:     Effort: Pulmonary effort is normal. No respiratory distress.     Breath sounds: Normal breath sounds. No wheezing or rales.  Abdominal:     General: Bowel sounds are normal. There is no distension.     Palpations: Abdomen is soft.     Tenderness: There is no abdominal tenderness.  Musculoskeletal:     Cervical back: Neck supple.     Right lower leg: No edema.     Left lower leg: No edema.  Skin:    General: Skin is warm.     Capillary Refill: Capillary refill takes less than 2 seconds.  Neurological:     General: No focal deficit present.     Mental Status: She is alert. Mental status is at baseline.     Motor: Weakness present.     Gait: Gait abnormal.     Comments: wheelchair  Psychiatric:        Mood and Affect: Mood normal.        Behavior: Behavior normal.     Labs reviewed: Recent Labs    05/31/22 0850 10/25/22 0710  NA 139 140  K 3.7 4.0  CL 107 107  CO2 28* 27*  BUN 17 25*  CREATININE 1.2* 1.3*  CALCIUM 9.1 9.4   Recent Labs    05/31/22 0850 10/25/22 0710  AST 18 13  ALT 16 8  ALKPHOS 92 78  ALBUMIN 3.7 3.8   Recent Labs    05/31/22 0850 10/25/22 0710  WBC 5.7 4.0  NEUTROABS  --  2,748.00  HGB 7.9* 8.5*  HCT 26* 27*  PLT 96* 78*   Lab Results  Component Value Date   TSH 3.25 05/31/2022   No results found for: "HGBA1C" Lab Results  Component Value Date   CHOL 125 10/25/2022   HDL 39  10/25/2022   LDLCALC 69 10/25/2022   TRIG 89 10/25/2022   CHOLHDL 3.8 11/23/2018    Significant Diagnostic Results in last 30 days:  No results found.  Assessment/Plan 1. Acute cystitis without hematuria - 04/05 increased confusion, dysuria - urine culture 100,000 CFU/mL klebsiella oxytoca - resolved with Cipro x 5 days  2. Bilateral impacted cerumen - cannot visualize TM - treated with debrox last month - repeat Debox and ear flushing  3. Essential hypertension - controlled with losartan  4. Mixed hyperlipidemia - LDL stable - cont atorvastatin  5. Stage 3a chronic kidney disease (HCC) - avoid nephrotoxic drugs like NSAIDS and dose adjust medications to be renally excreted - encourage hydration with water   6. Diastolic dysfunction - intermittent sob with exertion - cont torsemide M-F  7. 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 Acquired hypothyroidism - cont levothyroxine  9. Gastroesophageal reflux disease, unspecified whether esophagitis present - hgb stable - cont omeprazole  10. Depression, recurrent (HCC) - no mood changes - Na+ stable - cont zoloft, Wellbutrin and Buspar  11. Generalized anxiety disorder - see above  12. Chronic bilateral low back pain without sciatica - stable with tylenol, Voltaren gel, [place], and oxycodone prn   13. Unstable gait - cont skilled nursing    Family/ staff Communication: plan discussed with patient and nurse  Labs/tests ordered:  none

## 2022-12-23 ENCOUNTER — Encounter: Payer: Self-pay | Admitting: Internal Medicine

## 2022-12-23 ENCOUNTER — Non-Acute Institutional Stay (SKILLED_NURSING_FACILITY): Payer: Medicare Other | Admitting: Internal Medicine

## 2022-12-23 DIAGNOSIS — E039 Hypothyroidism, unspecified: Secondary | ICD-10-CM | POA: Diagnosis not present

## 2022-12-23 DIAGNOSIS — F339 Major depressive disorder, recurrent, unspecified: Secondary | ICD-10-CM

## 2022-12-23 DIAGNOSIS — I10 Essential (primary) hypertension: Secondary | ICD-10-CM

## 2022-12-23 DIAGNOSIS — M545 Low back pain, unspecified: Secondary | ICD-10-CM | POA: Diagnosis not present

## 2022-12-23 DIAGNOSIS — D61818 Other pancytopenia: Secondary | ICD-10-CM | POA: Diagnosis not present

## 2022-12-23 DIAGNOSIS — N1831 Chronic kidney disease, stage 3a: Secondary | ICD-10-CM

## 2022-12-23 DIAGNOSIS — G8929 Other chronic pain: Secondary | ICD-10-CM | POA: Diagnosis not present

## 2022-12-23 DIAGNOSIS — E782 Mixed hyperlipidemia: Secondary | ICD-10-CM | POA: Diagnosis not present

## 2022-12-23 NOTE — Progress Notes (Signed)
Location:  [facility] Room Number: 35A Place of Service:  SNF [phone]) Provider:  Mahlon Gammon, MD   Mahlon Gammon, MD  Patient Care Team: Mahlon Gammon, MD as PCP - General (Internal Medicine) Mast, Man X, NP as Nurse Practitioner (Internal Medicine)  Extended Emergency Contact Information Primary Emergency Contact: Hopkins,[name] Address: [address]          [place], [place] [address] [place] Home Phone: [phone] Mobile Phone: [phone] Relation: Son Secondary Emergency Contact: Olivos, Cindra [place] Home Phone: [phone] Mobile Phone: [phone] Relation: Other  Code Status:  DNR Goals of care: Advanced Directive information    12/23/2022    3:18 PM  Advanced Directives  Does Patient Have a Medical Advance Directive? Yes  Type of Estate agent of [place];Out of facility DNR (pink MOST or yellow form)  Does patient want to make changes to medical advance directive? No - Patient declined  Copy of Healthcare Power of Attorney in Chart? Yes - validated most recent copy scanned in chart (See row information)  Pre-existing out of facility DNR order (yellow form or pink MOST form) Pink MOST/Yellow Form most recent copy in chart - Physician notified to receive inpatient order;Yellow form placed in chart (order not valid for inpatient use)     Chief Complaint  Patient presents with   Medical Management of Chronic Issues    Patient is being seen for a routine visit     HPI:  Pt is a 89 y.o. female seen today for medical management of chronic diseases.    Lives in SNF   Patient has h/o Pancytopenia , h/o Chronic Back Pain, Hypertension, Hypothyroidism, Hypertension, Recurrent Cystitis, Osteoporosis and Major Depression   Right Trimalleolar Fracture. S/P ORIF in 09/22 Since then Wheelchair dependent   Recent Cognitive decline  Per patient and son she has been sleeping more and they want  some of her meds to be reduced Also c/o Some Abdominal Discomfort  Other wise stable Stays in her wheelchair Needs help with transfers  No Falls Does not walk anymore Cognitively doing well Wt Readings from Last 3 Encounters:  12/23/22 154 lb 3.2 oz (69.9 kg)  11/19/22 154 lb 6.4 oz (70 kg)  11/18/22 154 lb 6.4 oz (70 kg)       Past Medical History:  Diagnosis Date   Anemia    Anxiety    Chronotropic incompetence 12/2012    potentially medication related; noted on CPET    DDD (degenerative disc disease)    Eczema    GERD (gastroesophageal reflux disease)    H/O hiatal hernia    Hx: UTI (urinary tract infection)    Hyperlipidemia    Hypertension    Hypothyroidism    Osteopenia    Septicemia (HCC) 2002   following UTI   Vertigo, benign positional    Past Surgical History:  Procedure Laterality Date   BREAST SURGERY     left biopsy   CATARACT EXTRACTION Right    2 weeks ago   CPET / MET - PFTS     Consistent with chronotropic incompetence; See attached report in the results section   DILATION AND CURETTAGE OF UTERUS     x 2   DOPPLER ECHOCARDIOGRAPHY  01/10/2013   Normal LV size and function. Normal EF. Air sclerosis but no stenosis   ESOPHAGOGASTRODUODENOSCOPY  05/04/2012   Procedure: ESOPHAGOGASTRODUODENOSCOPY (EGD);  Surgeon: Louis Meckel, MD;  Location:  [facility];  Service: Endoscopy;  Laterality: N/A;   EYE SURGERY     cataract extraction with ILO  ? eye   IR KYPHO EA ADDL LEVEL THORACIC OR LUMBAR  10/28/2016   IR KYPHO LUMBAR INC FX REDUCE BONE BX UNI/BIL CANNULATION INC/IMAGING  10/28/2016   IR KYPHO THORACIC WITH BONE BIOPSY  12/24/2016   IR RADIOLOGIST EVAL & MGMT  11/11/2016   KNEE ARTHROSCOPY  2011   right   ORIF ANKLE FRACTURE Right 03/27/2021   Procedure: OPEN REDUCTION INTERNAL FIXATION (ORIF) ANKLE FRACTURE;  Surgeon: Teryl Lucy, MD;  Location: [facility];  Service: Orthopedics;  Laterality: Right;   TONSILLECTOMY     TOTAL KNEE ARTHROPLASTY   04/24/2012   Procedure: TOTAL KNEE ARTHROPLASTY;  Surgeon: Loanne Drilling, MD;  Location: [facility];  Service: Orthopedics;  Laterality: Right;   TRANSTHORACIC ECHOCARDIOGRAM  02/2018   Ordered by PCP for "congestive heart failure " -EF 60 M 65%.  GR 1 DD.  No R WMA--- > ESSENTIALLY NORMAL    No Known Allergies  Outpatient Encounter Medications as of 12/23/2022  Medication Sig   acetaminophen (TYLENOL) 500 MG tablet Take 1,000 mg by mouth 3 (three) times daily. For pain   ALPRAZolam (XANAX) 0.25 MG tablet TAKE 1 TABLET BY MOUTH TWICE A DAY AS NEEDED   atorvastatin (LIPITOR) 10 MG tablet Take 5 mg by mouth at bedtime.   buPROPion (WELLBUTRIN SR) 150 MG 12 hr tablet Take 1 tablet (150 mg total) by mouth 2 (two) times daily after a meal.   calcium citrate-vitamin D (CITRACAL+D) 315-200 MG-UNIT tablet Take 1 tablet by mouth 2 (two) times daily.   Camphor-Menthol-Methyl Sal 3.07-31-08 % PTCH Place 1 patch onto the skin See admin instructions. Apply one patch onto the skin of the  lower back daily for back pain   Carboxymethylcellulose Sodium (ARTIFICIAL TEARS OP) Place 1 drop into both eyes in the morning, at noon, and at bedtime.   diclofenac Sodium (VOLTAREN) 1 % GEL Apply 4 g topically in the morning and at bedtime. Apply to bilateral ankles BID   ferrous sulfate 325 (65 FE) MG EC tablet Take 325 mg by mouth every Monday, Wednesday, and Friday. With a meal   gabapentin (NEURONTIN) 100 MG capsule Take 100 mg by mouth 2 (two) times daily.   gabapentin (NEURONTIN) 100 MG capsule Take 200 mg by mouth at bedtime.   hydrocortisone cream 1 % Apply 1 Application topically as needed for itching. Apply to skin topically as needed for itching   levothyroxine (SYNTHROID) 75 MCG tablet Take 75 mcg by mouth every morning.   losartan (COZAAR) 25 MG tablet Take 25 mg by mouth every morning.   Menthol, Topical Analgesic, (BIOFREEZE) 4 % GEL Apply topically 2 (two) times daily as needed. Apply thin layer to left thigh    nystatin (MYCOSTATIN/NYSTOP) powder Apply 1 application  topically as needed (yeast).   nystatin cream (MYCOSTATIN) Apply 1 Application topically as needed.   omega-3 fish oil (MAXEPA) 1000 MG CAPS capsule Take 1 capsule by mouth daily. EVERY MORNING *TAKE WITH FOOD*   omeprazole (PRILOSEC) 40 MG capsule Take 40 mg by mouth every morning.   OxyCODONE HCl, Abuse Deter, (OXAYDO) 5 MG TABA Take 2.5 mg by mouth as needed. Give 30 minutes before transportation to ortho appointments.   OXYGEN Inhale into the lungs. For SOB to keep 02 sats >90%; every shift as needed for SOB   polyethylene glycol (MIRALAX / GLYCOLAX) 17 g packet  Take 17 g by mouth as directed. Every 2 days   potassium chloride SA (KLOR-CON M) 20 MEQ tablet Take 20 mEq by mouth as directed. Monday, Tuesday, Wednesday, Thursday, and Friday.   senna (SENOKOT) 8.6 MG TABS tablet Take 2 tablets by mouth at bedtime.   sertraline (ZOLOFT) 25 MG tablet Take 75 mg by mouth every morning.   torsemide (DEMADEX) 20 MG tablet Take 20 mg by mouth as directed. Once A Day on Mon, Tue, Wed, Thu, Fri   zinc oxide 20 % ointment Apply 1 application topically See admin instructions. Apply topically to per/buttocks after every incontinent episode and as needed for redness   No facility-administered encounter medications on file as of 12/23/2022.    Review of Systems  Constitutional:  Negative for activity change and appetite change.  HENT: Negative.    Respiratory:  Negative for cough and shortness of breath.   Cardiovascular:  Negative for leg swelling.  Gastrointestinal:  Positive for constipation.  Genitourinary: Negative.   Musculoskeletal:  Positive for back pain and gait problem. Negative for arthralgias and myalgias.  Skin: Negative.   Neurological:  Negative for dizziness and weakness.  Psychiatric/Behavioral:  Negative for confusion, dysphoric mood and sleep disturbance. The patient is nervous/anxious.     Immunization History   Administered Date(s) Administered   Fluad Quad(high Dose 65+) 05/19/2022   Influenza, High Dose Seasonal PF 04/26/2016, 03/22/2017, 05/08/2019   Influenza,inj,Quad PF,6+ Mos 04/27/2018   Influenza-Unspecified 04/22/2011, 04/04/2012, 05/03/2013, 05/04/2013, 04/17/2014, 04/19/2015, 04/26/2016, 04/22/2017, 05/20/2021   Moderna SARS-COV2 Booster Vaccination 06/09/2020, 12/31/2020   Moderna Sars-Covid-2 Vaccination 07/30/2019, 08/27/2019, 01/06/2022   PFIZER(Purple Top)SARS-COV-2 Vaccination 06/01/2022   Pfizer Covid-19 Vaccine Bivalent Booster 74yrs & up 04/15/2021   Pneumococcal Polysaccharide-23 08/26/2003, 09/06/2011, 05/15/2019   Pneumococcal-Unspecified 12/20/2013   Td 09/26/2006   Tdap 05/15/2019   Zoster Recombinat (Shingrix) 11/21/2017, 04/08/2018   Zoster, Live 10/25/2007   Pertinent  Health Maintenance Due  Topic Date Due   INFLUENZA VACCINE  02/24/2023   OPHTHALMOLOGY EXAM  03/24/2023   DEXA SCAN  Completed      09/22/2022   12:30 PM 10/06/2022   11:22 AM 10/11/2022   10:15 AM 10/20/2022   10:35 AM 10/26/2022    9:26 AM  Fall Risk  Falls in the past year? 1 1 1 1  0  Was there an injury with Fall? 0 0 0 0 0  Fall Risk Category Calculator 1 2 2 1  0  Patient at Risk for Falls Due to History of fall(s);Impaired balance/gait;Impaired mobility History of fall(s);Impaired balance/gait;Impaired mobility;Mental status change History of fall(s);Impaired balance/gait;Impaired mobility History of fall(s);Impaired balance/gait;Impaired mobility History of fall(s);Impaired balance/gait;Impaired mobility  Fall risk Follow up Falls evaluation completed;Education provided;Falls prevention discussed Falls evaluation completed;Education provided;Falls prevention discussed Falls evaluation completed;Education provided;Falls prevention discussed Falls evaluation completed;Education provided;Falls prevention discussed Falls evaluation completed   Functional Status Survey:    Vitals:   12/23/22  1516  BP: 119/63  Pulse: 63  Resp: 20  Temp: (!) 97.4 F (36.3 C)  TempSrc: Temporal  SpO2: 93%  Weight: 154 lb 3.2 oz (69.9 kg)  Height: 5\' 4"  (1.626 m)   Body mass index is 26.47 kg/m. Physical Exam Vitals reviewed.  Constitutional:      Appearance: Normal appearance.  HENT:     Head: Normocephalic.     Nose: Nose normal.     Mouth/Throat:     Mouth: Mucous membranes are moist.     Pharynx: Oropharynx is clear.  Eyes:  Pupils: Pupils are equal, round, and reactive to light.  Cardiovascular:     Rate and Rhythm: Normal rate and regular rhythm.     Pulses: Normal pulses.     Heart sounds: Normal heart sounds. No murmur heard. Pulmonary:     Effort: Pulmonary effort is normal.     Breath sounds: Normal breath sounds.  Abdominal:     General: Abdomen is flat. Bowel sounds are normal.     Palpations: Abdomen is soft.  Musculoskeletal:        General: No swelling.     Cervical back: Neck supple.  Skin:    General: Skin is warm.  Neurological:     General: No focal deficit present.     Mental Status: She is alert and oriented to person, place, and time.  Psychiatric:        Mood and Affect: Mood normal.        Thought Content: Thought content normal.     Labs reviewed: Recent Labs    05/31/22 0850 10/25/22 0710  NA 139 140  K 3.7 4.0  CL 107 107  CO2 28* 27*  BUN 17 25*  CREATININE 1.2* 1.3*  CALCIUM 9.1 9.4   Recent Labs    05/31/22 0850 10/25/22 0710  AST 18 13  ALT 16 8  ALKPHOS 92 78  ALBUMIN 3.7 3.8   Recent Labs    05/31/22 0850 10/25/22 0710  WBC 5.7 4.0  NEUTROABS  --  2,748.00  HGB 7.9* 8.5*  HCT 26* 27*  PLT 96* 78*   Lab Results  Component Value Date   TSH 3.25 05/31/2022   No results found for: "HGBA1C" Lab Results  Component Value Date   CHOL 125 10/25/2022   HDL 39 10/25/2022   LDLCALC 69 10/25/2022   TRIG 89 10/25/2022   CHOLHDL 3.8 11/23/2018    Significant Diagnostic Results in last 30 days:  No results  found.  Assessment/Plan 1. Essential hypertension BP running on lower side Will reduce her Losartan to 12.5 mg   2. Mixed hyperlipidemia Discontinue Statin due to goals of care  3. Stage 3a chronic kidney disease (HCC) stable  4. Acquired hypothyroidism TSH Normal in 11/23  5. Depression, recurrent (HCC) Reduce Zoloft to 50 mg as feels more sleepy and her mood is stable Also on Wellbutrin and Xanax 6. Chronic bilateral low back pain without sciatica Tylenol Reduce Neurontin to 200 mg QHS only as per Request  7. Pancytopenia (HCC) On Iron No More work up per Hematology  Reduce to 2/week 8 Med Management [ Per request Discontinue Supplements 9 LE edema Managed with Torsemide PRN    Family/ staff Communication: Son   Labs/tests ordered:   Total time spent in this patient care encounter was  45_  minutes; greater than 50% of the visit spent counseling patient and staff, reviewing records , Labs and coordinating care for problems addressed at this encounter.

## 2023-01-20 ENCOUNTER — Non-Acute Institutional Stay (SKILLED_NURSING_FACILITY): Payer: Medicare Other | Admitting: Orthopedic Surgery

## 2023-01-20 ENCOUNTER — Encounter: Payer: Self-pay | Admitting: Orthopedic Surgery

## 2023-01-20 DIAGNOSIS — E039 Hypothyroidism, unspecified: Secondary | ICD-10-CM

## 2023-01-20 DIAGNOSIS — F0392 Unspecified dementia, unspecified severity, with psychotic disturbance: Secondary | ICD-10-CM | POA: Diagnosis not present

## 2023-01-20 DIAGNOSIS — I10 Essential (primary) hypertension: Secondary | ICD-10-CM | POA: Diagnosis not present

## 2023-01-20 DIAGNOSIS — I5189 Other ill-defined heart diseases: Secondary | ICD-10-CM

## 2023-01-20 DIAGNOSIS — E782 Mixed hyperlipidemia: Secondary | ICD-10-CM

## 2023-01-20 DIAGNOSIS — N1831 Chronic kidney disease, stage 3a: Secondary | ICD-10-CM

## 2023-01-20 DIAGNOSIS — D61818 Other pancytopenia: Secondary | ICD-10-CM

## 2023-01-20 DIAGNOSIS — F418 Other specified anxiety disorders: Secondary | ICD-10-CM

## 2023-01-20 NOTE — Progress Notes (Signed)
Location:  [facility] Room Number: 35A Place of Service:  SNF [phone]) Provider:  Brinden Kincheloe Armandina Stammer, Freddie Breech, MD  Patient Care Team: Mahlon Gammon, MD as PCP - General (Internal Medicine) Mast, Man X, NP as Nurse Practitioner (Internal Medicine)  Extended Emergency Contact Information Primary Emergency Contact: Jorgensen,[name] Address: [address]          [place], [place] [address] [place] Home Phone: [phone] Mobile Phone: [phone] Relation: Son Secondary Emergency Contact: Frederic, Cindra [place] Home Phone: [phone] Mobile Phone: [phone] Relation: Other  Code Status:  DNR Goals of care: Advanced Directive information    01/20/2023    3:54 PM  Advanced Directives  Does Patient Have a Medical Advance Directive? Yes  Type of Estate agent of [place];Out of facility DNR (pink MOST or yellow form)  Does patient want to make changes to medical advance directive? No - Patient declined  Copy of Healthcare Power of Attorney in Chart? Yes - validated most recent copy scanned in chart (See row information)  Pre-existing out of facility DNR order (yellow form or pink MOST form) Pink MOST/Yellow Form most recent copy in chart - Physician notified to receive inpatient order;Yellow form placed in chart (order not valid for inpatient use)     No chief complaint on file.   HPI:  Pt is a 89 y.o. female seen today for medical management of chronic diseases.    She currently resides on the skilled nursing unit at [facility]. PMH: HTN, HLD, PVD, CKD, esophagitis, GERD, GI bleed, thrombocytopenia, pancytopenia, hypothyroidism, OA, L2 compression fracture, right trimalleolar fracture s/p ORIF 03/2021, chronic back pain, depression and anxiety.   HTN- BUN/creat 25/1.3 10/25/2022, remains on losartan HLD- LDL 69 10/25/2022, statin discontinued last month per goals of care CKD- see  above Diastolic dysfunction- BNP 289 40/04/2724, Echo 03/18 noted normal LV systolic function and mild LV hypertrophy, some sob with exertion, remains in torsemide M-F Hallucinations with dementia- BIMS score 15/15 (06/06)> was 15/15 (02/26), CT head noted chronic microvascular ischemic disease 04/16/2022, intermittent confusion with hallucinations, remains on xanax prn Hypothyroidism- TSH 3.25 05/31/2022, remains on levothyroxine Pancytopenia- no further workup, iron reduced to 2x/weekly last month Depression with anxiety- no mood changes, Na+ 140 10/25/2022, remains on Wellbutrin, Zoloft and xanax prn  Recent weights:  06/01- 150.2 lbs  05/01- 154.2 lbs  04/01- 154.2 lbs  Recent blood pressures:  06/25- 118/68  06/18- 102/68  06/11- 134/84 Past Medical History:  Diagnosis Date   Anemia    Anxiety    Chronotropic incompetence 12/2012    potentially medication related; noted on CPET    DDD (degenerative disc disease)    Eczema    GERD (gastroesophageal reflux disease)    H/O hiatal hernia    Hx: UTI (urinary tract infection)    Hyperlipidemia    Hypertension    Hypothyroidism    Osteopenia    Septicemia (HCC) 2002   following UTI   Vertigo, benign positional    Past Surgical History:  Procedure Laterality Date   BREAST SURGERY     left biopsy   CATARACT EXTRACTION Right    2 weeks ago   CPET / MET - PFTS     Consistent with chronotropic incompetence; See attached report in the results section   DILATION AND CURETTAGE OF UTERUS     x 2   DOPPLER ECHOCARDIOGRAPHY  01/10/2013   Normal LV size  and function. Normal EF. Air sclerosis but no stenosis   ESOPHAGOGASTRODUODENOSCOPY  05/04/2012   Procedure: ESOPHAGOGASTRODUODENOSCOPY (EGD);  Surgeon: Louis Meckel, MD;  Location: [facility];  Service: Endoscopy;  Laterality: N/A;   EYE SURGERY     cataract extraction with ILO  ? eye   IR KYPHO EA ADDL LEVEL THORACIC OR LUMBAR  10/28/2016   IR KYPHO LUMBAR INC FX REDUCE BONE  BX UNI/BIL CANNULATION INC/IMAGING  10/28/2016   IR KYPHO THORACIC WITH BONE BIOPSY  12/24/2016   IR RADIOLOGIST EVAL & MGMT  11/11/2016   KNEE ARTHROSCOPY  2011   right   ORIF ANKLE FRACTURE Right 03/27/2021   Procedure: OPEN REDUCTION INTERNAL FIXATION (ORIF) ANKLE FRACTURE;  Surgeon: Teryl Lucy, MD;  Location: [facility];  Service: Orthopedics;  Laterality: Right;   TONSILLECTOMY     TOTAL KNEE ARTHROPLASTY  04/24/2012   Procedure: TOTAL KNEE ARTHROPLASTY;  Surgeon: Loanne Drilling, MD;  Location: [facility];  Service: Orthopedics;  Laterality: Right;   TRANSTHORACIC ECHOCARDIOGRAM  02/2018   Ordered by PCP for "congestive heart failure " -EF 60 M 65%.  GR 1 DD.  No R WMA--- > ESSENTIALLY NORMAL    No Known Allergies  Outpatient Encounter Medications as of 01/20/2023  Medication Sig   acetaminophen (TYLENOL) 500 MG tablet Take 1,000 mg by mouth 3 (three) times daily. For pain   ALPRAZolam (XANAX) 0.25 MG tablet TAKE 1 TABLET BY MOUTH TWICE A DAY AS NEEDED   buPROPion (WELLBUTRIN SR) 150 MG 12 hr tablet Take 1 tablet (150 mg total) by mouth 2 (two) times daily after a meal.   calcium citrate-vitamin D (CITRACAL+D) 315-200 MG-UNIT tablet Take 1 tablet by mouth 2 (two) times daily.   Camphor-Menthol-Methyl Sal 3.07-31-08 % PTCH Place 1 patch onto the skin See admin instructions. Apply one patch onto the skin of the  lower back daily for back pain   Carboxymethylcellulose Sodium (ARTIFICIAL TEARS OP) Place 1 drop into both eyes in the morning, at noon, and at bedtime.   diclofenac Sodium (VOLTAREN) 1 % GEL Apply 4 g topically in the morning and at bedtime. Apply to bilateral ankles BID   ferrous sulfate 325 (65 FE) MG EC tablet Take 325 mg by mouth. 2/ week   gabapentin (NEURONTIN) 100 MG capsule Take 200 mg by mouth at bedtime.   hydrocortisone cream 1 % Apply 1 Application topically as needed for itching. Apply to skin topically as needed for itching   levothyroxine (SYNTHROID) 75 MCG tablet Take 75  mcg by mouth every morning.   losartan (COZAAR) 25 MG tablet Take 12.5 mg by mouth every morning.   Menthol, Topical Analgesic, (BIOFREEZE) 4 % GEL Apply topically 2 (two) times daily as needed. Apply thin layer to left thigh   nystatin (MYCOSTATIN/NYSTOP) powder Apply 1 application  topically as needed (yeast).   nystatin cream (MYCOSTATIN) Apply 1 Application topically as needed.   omega-3 fish oil (MAXEPA) 1000 MG CAPS capsule Take 1 capsule by mouth daily. EVERY MORNING *TAKE WITH FOOD*   omeprazole (PRILOSEC) 40 MG capsule Take 40 mg by mouth every morning.   OxyCODONE HCl, Abuse Deter, (OXAYDO) 5 MG TABA Take 2.5 mg by mouth as needed. Give 30 minutes before transportation to ortho appointments.   OXYGEN Inhale into the lungs. For SOB to keep 02 sats >90%; every shift as needed for SOB   polyethylene glycol (MIRALAX / GLYCOLAX) 17 g packet Take 17 g by mouth as directed. Every 2  days   potassium chloride SA (KLOR-CON M) 20 MEQ tablet Take 20 mEq by mouth as directed. Monday, Tuesday, Wednesday, Thursday, and Friday.   senna (SENOKOT) 8.6 MG TABS tablet Take 2 tablets by mouth at bedtime.   sertraline (ZOLOFT) 25 MG tablet Take 50 mg by mouth every morning.   torsemide (DEMADEX) 20 MG tablet Take 20 mg by mouth as directed. Once A Day on Mon, Tue, Wed, Thu, Fri   zinc oxide 20 % ointment Apply 1 application topically See admin instructions. Apply topically to per/buttocks after every incontinent episode and as needed for redness   No facility-administered encounter medications on file as of 01/20/2023.    Review of Systems  Constitutional:  Negative for activity change and appetite change.  HENT:  Negative for sore throat and trouble swallowing.   Eyes:  Negative for visual disturbance.  Respiratory:  Negative for cough, shortness of breath and wheezing.   Cardiovascular:  Negative for chest pain and leg swelling.  Gastrointestinal:  Positive for constipation. Negative for abdominal  distention and abdominal pain.  Genitourinary:  Negative for dysuria, frequency and hematuria.  Musculoskeletal:  Positive for back pain and gait problem.  Skin:  Negative for wound.  Neurological:  Positive for weakness. Negative for dizziness and headaches.  Psychiatric/Behavioral:  Positive for confusion, dysphoric mood and hallucinations. Negative for sleep disturbance. The patient is nervous/anxious.     Immunization History  Administered Date(s) Administered   Fluad Quad(high Dose 65+) 05/19/2022   Influenza, High Dose Seasonal PF 04/26/2016, 03/22/2017, 05/08/2019   Influenza,inj,Quad PF,6+ Mos 04/27/2018   Influenza-Unspecified 04/22/2011, 04/04/2012, 05/03/2013, 05/04/2013, 04/17/2014, 04/19/2015, 04/26/2016, 04/22/2017, 05/20/2021   Moderna SARS-COV2 Booster Vaccination 06/09/2020, 12/31/2020   Moderna Sars-Covid-2 Vaccination 07/30/2019, 08/27/2019, 01/06/2022   PFIZER(Purple Top)SARS-COV-2 Vaccination 06/01/2022   Pfizer Covid-19 Vaccine Bivalent Booster 72yrs & up 04/15/2021   Pneumococcal Polysaccharide-23 08/26/2003, 09/06/2011, 05/15/2019   Pneumococcal-Unspecified 12/20/2013   Td 09/26/2006   Tdap 05/15/2019   Zoster Recombinat (Shingrix) 11/21/2017, 04/08/2018   Zoster, Live 10/25/2007   Pertinent  Health Maintenance Due  Topic Date Due   INFLUENZA VACCINE  02/24/2023   OPHTHALMOLOGY EXAM  03/24/2023   DEXA SCAN  Completed      10/06/2022   11:22 AM 10/11/2022   10:15 AM 10/20/2022   10:35 AM 10/26/2022    9:26 AM 01/20/2023    3:50 PM  Fall Risk  Falls in the past year? 1 1 1  0 0  Was there an injury with Fall? 0 0 0 0 0  Fall Risk Category Calculator 2 2 1  0 0  Patient at Risk for Falls Due to History of fall(s);Impaired balance/gait;Impaired mobility;Mental status change History of fall(s);Impaired balance/gait;Impaired mobility History of fall(s);Impaired balance/gait;Impaired mobility History of fall(s);Impaired balance/gait;Impaired mobility   Fall risk  Follow up Falls evaluation completed;Education provided;Falls prevention discussed Falls evaluation completed;Education provided;Falls prevention discussed Falls evaluation completed;Education provided;Falls prevention discussed Falls evaluation completed    Functional Status Survey:    Vitals:   01/20/23 1500  BP: 118/68  Pulse: 67  Resp: 16  Temp: 97.6 F (36.4 C)  TempSrc: Temporal  SpO2: 97%  Weight: 154 lb 3.2 oz (69.9 kg)  Height: 5\' 4"  (1.626 m)   Body mass index is 26.47 kg/m. Physical Exam Vitals reviewed.  Constitutional:      General: She is not in acute distress. HENT:     Head: Normocephalic.  Eyes:     General:        Right eye:  No discharge.        Left eye: No discharge.  Cardiovascular:     Rate and Rhythm: Normal rate and regular rhythm.     Pulses: Normal pulses.     Heart sounds: Normal heart sounds.  Pulmonary:     Effort: Pulmonary effort is normal. No respiratory distress.     Breath sounds: Normal breath sounds. No wheezing.  Abdominal:     General: Bowel sounds are normal. There is no distension.     Palpations: Abdomen is soft.     Tenderness: There is no abdominal tenderness.  Musculoskeletal:     Cervical back: Neck supple.     Right lower leg: No edema.     Left lower leg: No edema.  Skin:    General: Skin is warm.     Capillary Refill: Capillary refill takes less than 2 seconds.  Neurological:     General: No focal deficit present.     Mental Status: She is alert and oriented to person, place, and time.     Motor: Weakness present.     Gait: Gait abnormal.     Comments: wheelchair  Psychiatric:        Mood and Affect: Mood normal.     Labs reviewed: Recent Labs    05/31/22 0850 10/25/22 0710  NA 139 140  K 3.7 4.0  CL 107 107  CO2 28* 27*  BUN 17 25*  CREATININE 1.2* 1.3*  CALCIUM 9.1 9.4   Recent Labs    05/31/22 0850 10/25/22 0710  AST 18 13  ALT 16 8  ALKPHOS 92 78  ALBUMIN 3.7 3.8   Recent Labs     05/31/22 0850 10/25/22 0710  WBC 5.7 4.0  NEUTROABS  --  2,748.00  HGB 7.9* 8.5*  HCT 26* 27*  PLT 96* 78*   Lab Results  Component Value Date   TSH 3.25 05/31/2022   No results found for: "HGBA1C" Lab Results  Component Value Date   CHOL 125 10/25/2022   HDL 39 10/25/2022   LDLCALC 69 10/25/2022   TRIG 89 10/25/2022   CHOLHDL 3.8 11/23/2018    Significant Diagnostic Results in last 30 days:  No results found.  Assessment/Plan 1. Essential hypertension - controlled - cont losartan  2. Mixed hyperlipidemia - off statin due to goals of care  3. Stage 3a chronic kidney disease (HCC) - encourage hydration - avoid NSAIDS  4. Diastolic dysfunction - compensated - cont torsemide M-F  5. 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 Acquired hypothyroidism - TSH stable - cont levothyroxine  7. Pancytopenia - no further workup - ferrous sulfate reduced to 2x/week last month  8. Depression with anxiety - no mood changes - good family support - cont Wellbutrin, Zoloft and Xanax    Family/ staff Communication: plan discussed with patient and nurse  Labs/tests ordered:  none

## 2023-02-21 ENCOUNTER — Non-Acute Institutional Stay (SKILLED_NURSING_FACILITY): Payer: Medicare Other | Admitting: Orthopedic Surgery

## 2023-02-21 ENCOUNTER — Encounter: Payer: Self-pay | Admitting: Orthopedic Surgery

## 2023-02-21 DIAGNOSIS — E039 Hypothyroidism, unspecified: Secondary | ICD-10-CM | POA: Diagnosis not present

## 2023-02-21 DIAGNOSIS — I10 Essential (primary) hypertension: Secondary | ICD-10-CM

## 2023-02-21 DIAGNOSIS — G8929 Other chronic pain: Secondary | ICD-10-CM

## 2023-02-21 DIAGNOSIS — N1831 Chronic kidney disease, stage 3a: Secondary | ICD-10-CM

## 2023-02-21 DIAGNOSIS — F0392 Unspecified dementia, unspecified severity, with psychotic disturbance: Secondary | ICD-10-CM

## 2023-02-21 DIAGNOSIS — I5189 Other ill-defined heart diseases: Secondary | ICD-10-CM | POA: Diagnosis not present

## 2023-02-21 DIAGNOSIS — D61818 Other pancytopenia: Secondary | ICD-10-CM | POA: Diagnosis not present

## 2023-02-21 DIAGNOSIS — E782 Mixed hyperlipidemia: Secondary | ICD-10-CM | POA: Diagnosis not present

## 2023-02-21 DIAGNOSIS — M545 Low back pain, unspecified: Secondary | ICD-10-CM

## 2023-02-21 DIAGNOSIS — F418 Other specified anxiety disorders: Secondary | ICD-10-CM

## 2023-02-21 NOTE — Progress Notes (Signed)
Location:   [facility]  Nursing Home Room Number: 35-A Place of Service:  SNF [phone]) Provider:  Hazle Nordmann, NP  PCP: Mahlon Gammon, MD  Patient Care Team: Mahlon Gammon, MD as PCP - General (Internal Medicine) Mast, Man X, NP as Nurse Practitioner (Internal Medicine)  Extended Emergency Contact Information Primary Emergency Contact: Ledford,[name] Address: [address]          [place], [place] [address] [place] Home Phone: [phone] Mobile Phone: [phone] Relation: Son Secondary Emergency Contact: Lauritsen, Cindra [place] Home Phone: [phone] Mobile Phone: [phone] Relation: Other  Code Status:  DNR Goals of care: Advanced Directive information    02/21/2023   10:26 AM  Advanced Directives  Does Patient Have a Medical Advance Directive? Yes  Type of Estate agent of [place];Out of facility DNR (pink MOST or yellow form)  Does patient want to make changes to medical advance directive? No - Patient declined  Copy of Healthcare Power of Attorney in Chart? Yes - validated most recent copy scanned in chart (See row information)     Chief Complaint  Patient presents with   Medical Management of Chronic Issues    Routine Visit.    HPI:  Pt is a 89 y.o. female seen today for medical management of chronic diseases.    She currently resides on the skilled nursing unit at [facility] PMH: HTN, HLD, PVD, CKD, esophagitis, GERD, GI bleed, thrombocytopenia, pancytopenia, hypothyroidism, OA, L2 compression fracture, right trimalleolar fracture s/p ORIF 03/2021, chronic back pain, depression and anxiety.   Chronic back pain- h/o compression fracture T11/L1/L2 and scoliosis, remains on tylenol, gabapentin, voltaren gel and oxycodone prn HTN- BUN/creat 25/1.3 10/25/2022, remains on losartan HLD- LDL 69 10/25/2022, statin discontinued last month per goals of care CKD- see above Diastolic dysfunction- BNP 289  10/08/2021, Echo 03/18 noted normal LV systolic function and mild LV hypertrophy, some sob with exertion, remains in torsemide M-F Hallucinations with dementia- BIMS score 15/15 (06/06)> was 15/15 (02/26), CT head noted chronic microvascular ischemic disease 04/16/2022, intermittent confusion with hallucinations, remains on xanax prn Hypothyroidism- TSH 3.25 05/31/2022, remains on levothyroxine Pancytopenia- no further workup, remains on ferrous sulfate 2x/week Depression with anxiety- no mood changes or panic attacks, Na+ 140 10/25/2022, remains on Wellbutrin and Zoloft  Recent blood pressures:  07/23- 135/86  07/16- 121/58  07/09- 128/78  Recent weights:  07/01- 150.6 lbs  05/01- 154.4 lbs  03/14- 159 lbs     Past Medical History:  Diagnosis Date   Anemia    Anxiety    Chronotropic incompetence 12/2012    potentially medication related; noted on CPET    DDD (degenerative disc disease)    Eczema    GERD (gastroesophageal reflux disease)    H/O hiatal hernia    Hx: UTI (urinary tract infection)    Hyperlipidemia    Hypertension    Hypothyroidism    Osteopenia    Septicemia (HCC) 2002   following UTI   Vertigo, benign positional    Past Surgical History:  Procedure Laterality Date   BREAST SURGERY     left biopsy   CATARACT EXTRACTION Right    2 weeks ago   CPET / MET - PFTS     Consistent with chronotropic incompetence; See attached report in the results section   DILATION AND CURETTAGE OF UTERUS     x 2   DOPPLER ECHOCARDIOGRAPHY  01/10/2013   Normal LV  size and function. Normal EF. Air sclerosis but no stenosis   ESOPHAGOGASTRODUODENOSCOPY  05/04/2012   Procedure: ESOPHAGOGASTRODUODENOSCOPY (EGD);  Surgeon: Louis Meckel, MD;  Location: [facility];  Service: Endoscopy;  Laterality: N/A;   EYE SURGERY     cataract extraction with ILO  ? eye   IR KYPHO EA ADDL LEVEL THORACIC OR LUMBAR  10/28/2016   IR KYPHO LUMBAR INC FX REDUCE BONE BX UNI/BIL CANNULATION  INC/IMAGING  10/28/2016   IR KYPHO THORACIC WITH BONE BIOPSY  12/24/2016   IR RADIOLOGIST EVAL & MGMT  11/11/2016   KNEE ARTHROSCOPY  2011   right   ORIF ANKLE FRACTURE Right 03/27/2021   Procedure: OPEN REDUCTION INTERNAL FIXATION (ORIF) ANKLE FRACTURE;  Surgeon: Teryl Lucy, MD;  Location: [facility];  Service: Orthopedics;  Laterality: Right;   TONSILLECTOMY     TOTAL KNEE ARTHROPLASTY  04/24/2012   Procedure: TOTAL KNEE ARTHROPLASTY;  Surgeon: Loanne Drilling, MD;  Location: [facility];  Service: Orthopedics;  Laterality: Right;   TRANSTHORACIC ECHOCARDIOGRAM  02/2018   Ordered by PCP for "congestive heart failure " -EF 60 M 65%.  GR 1 DD.  No R WMA--- > ESSENTIALLY NORMAL    No Known Allergies  Allergies as of 02/21/2023   No Known Allergies      Medication List        Accurate as of February 21, 2023 10:27 AM. If you have any questions, ask your nurse or doctor.          STOP taking these medications    ALPRAZolam 0.25 MG tablet Commonly known as: XANAX Stopped by: Octavia Heir   omega-3 fish oil 1000 MG Caps capsule Commonly known as: MAXEPA Stopped by: Octavia Heir       TAKE these medications    acetaminophen 500 MG tablet Commonly known as: TYLENOL Take 1,000 mg by mouth 3 (three) times daily. For pain   ARTIFICIAL TEARS OP Place 1 drop into both eyes in the morning, at noon, and at bedtime.   Biofreeze 4 % Gel Generic drug: Menthol (Topical Analgesic) Apply topically 2 (two) times daily as needed. Apply thin layer to left thigh   buPROPion 150 MG 12 hr tablet Commonly known as: WELLBUTRIN SR Take 1 tablet (150 mg total) by mouth 2 (two) times daily after a meal.   calcium citrate-vitamin D 315-200 MG-UNIT tablet Commonly known as: CITRACAL+D Take 1 tablet by mouth 2 (two) times daily.   Camphor-Menthol-Methyl Sal 3.07-31-08 % Ptch Place 1 patch onto the skin See admin instructions. Apply one patch onto the skin of the  lower back daily for back pain    diclofenac Sodium 1 % Gel Commonly known as: VOLTAREN Apply 4 g topically in the morning and at bedtime. Apply to bilateral ankles BID   ferrous sulfate 325 (65 FE) MG EC tablet Take 325 mg by mouth. 2/ week   gabapentin 100 MG capsule Commonly known as: NEURONTIN Take 200 mg by mouth at bedtime.   hydrocortisone cream 1 % Apply 1 Application topically as needed for itching. Apply to skin topically as needed for itching   levothyroxine 75 MCG tablet Commonly known as: SYNTHROID Take 75 mcg by mouth every morning.   losartan 25 MG tablet Commonly known as: COZAAR Take 12.5 mg by mouth every morning.   nystatin powder Commonly known as: MYCOSTATIN/NYSTOP Apply 1 application  topically as needed (yeast).   nystatin cream Commonly known as: MYCOSTATIN Apply 1 Application topically as  needed.   omeprazole 40 MG capsule Commonly known as: PRILOSEC Take 40 mg by mouth every morning.   OxyCODONE HCl (Abuse Deter) 5 MG Taba Commonly known as: OXAYDO Take 2.5 mg by mouth as needed. Give 30 minutes before transportation to ortho appointments.   OXYGEN Inhale into the lungs. For SOB to keep 02 sats >90%; every shift as needed for SOB   polyethylene glycol 17 g packet Commonly known as: MIRALAX / GLYCOLAX Take 17 g by mouth as directed. Every 2 days   potassium chloride SA 20 MEQ tablet Commonly known as: KLOR-CON M Take 20 mEq by mouth as directed. Monday, Tuesday, Wednesday, Thursday, and Friday.   senna 8.6 MG Tabs tablet Commonly known as: SENOKOT Take 2 tablets by mouth at bedtime.   sertraline 25 MG tablet Commonly known as: ZOLOFT Take 50 mg by mouth every morning.   torsemide 20 MG tablet Commonly known as: DEMADEX Take 20 mg by mouth as directed. Once A Day on Mon, Tue, Wed, Thu, Fri   zinc oxide 20 % ointment Apply 1 application topically See admin instructions. Apply topically to per/buttocks after every incontinent episode and as needed for redness         Review of Systems  Constitutional:  Negative for activity change and appetite change.  HENT:  Negative for sore throat and trouble swallowing.   Eyes:  Negative for visual disturbance.  Respiratory:  Negative for cough, shortness of breath and wheezing.   Cardiovascular:  Positive for leg swelling. Negative for chest pain.  Gastrointestinal:  Negative for abdominal distention and abdominal pain.  Genitourinary:  Negative for dysuria, frequency and hematuria.  Musculoskeletal:  Positive for arthralgias, back pain and gait problem.  Skin:  Negative for wound.  Neurological:  Positive for weakness. Negative for dizziness and headaches.  Psychiatric/Behavioral:  Positive for dysphoric mood. The patient is nervous/anxious.     Immunization History  Administered Date(s) Administered   Fluad Quad(high Dose 65+) 05/19/2022   Influenza, High Dose Seasonal PF 04/26/2016, 03/22/2017, 05/08/2019   Influenza,inj,Quad PF,6+ Mos 04/27/2018   Influenza-Unspecified 04/22/2011, 04/04/2012, 05/03/2013, 05/04/2013, 04/17/2014, 04/19/2015, 04/26/2016, 04/22/2017, 05/20/2021   Moderna SARS-COV2 Booster Vaccination 06/09/2020, 12/31/2020   Moderna Sars-Covid-2 Vaccination 07/30/2019, 08/27/2019, 01/06/2022   PFIZER(Purple Top)SARS-COV-2 Vaccination 06/01/2022   Pfizer Covid-19 Vaccine Bivalent Booster 65yrs & up 04/15/2021   Pneumococcal Polysaccharide-23 08/26/2003, 09/06/2011, 05/15/2019   Pneumococcal-Unspecified 12/20/2013   Td 09/26/2006   Tdap 05/15/2019   Zoster Recombinant(Shingrix) 11/21/2017, 04/08/2018   Zoster, Live 10/25/2007   Pertinent  Health Maintenance Due  Topic Date Due   INFLUENZA VACCINE  02/24/2023   OPHTHALMOLOGY EXAM  03/24/2023   DEXA SCAN  Completed      10/06/2022   11:22 AM 10/11/2022   10:15 AM 10/20/2022   10:35 AM 10/26/2022    9:26 AM 01/20/2023    3:50 PM  Fall Risk  Falls in the past year? 1 1 1  0 0  Was there an injury with Fall? 0 0 0 0 0  Fall Risk  Category Calculator 2 2 1  0 0  Patient at Risk for Falls Due to History of fall(s);Impaired balance/gait;Impaired mobility;Mental status change History of fall(s);Impaired balance/gait;Impaired mobility History of fall(s);Impaired balance/gait;Impaired mobility History of fall(s);Impaired balance/gait;Impaired mobility   Fall risk Follow up Falls evaluation completed;Education provided;Falls prevention discussed Falls evaluation completed;Education provided;Falls prevention discussed Falls evaluation completed;Education provided;Falls prevention discussed Falls evaluation completed    Functional Status Survey:    Vitals:   02/21/23 1020  BP: 135/86  Pulse: 78  Resp: 18  Temp: (!) 97.1 F (36.2 C)  SpO2: 97%  Weight: 150 lb 9.6 oz (68.3 kg)  Height: 5\' 4"  (1.626 m)   Body mass index is 25.85 kg/m. Physical Exam Vitals reviewed.  Constitutional:      General: She is not in acute distress. HENT:     Head: Normocephalic.     Right Ear: There is no impacted cerumen.     Left Ear: There is no impacted cerumen.     Nose: Nose normal.     Mouth/Throat:     Mouth: Mucous membranes are moist.  Eyes:     General:        Right eye: No discharge.        Left eye: No discharge.  Cardiovascular:     Rate and Rhythm: Normal rate and regular rhythm.     Pulses: Normal pulses.     Heart sounds: Normal heart sounds.  Pulmonary:     Effort: Pulmonary effort is normal. No respiratory distress.     Breath sounds: Normal breath sounds. No wheezing.  Abdominal:     General: Bowel sounds are normal. There is no distension.     Palpations: Abdomen is soft.     Tenderness: There is no abdominal tenderness.  Musculoskeletal:     Cervical back: Neck supple.     Right lower leg: No edema.     Left lower leg: No edema.  Skin:    General: Skin is warm.     Capillary Refill: Capillary refill takes less than 2 seconds.  Neurological:     General: No focal deficit present.     Mental Status: She  is alert and oriented to person, place, and time.     Motor: Weakness present.     Gait: Gait abnormal.  Psychiatric:        Mood and Affect: Mood normal.     Labs reviewed: Recent Labs    05/31/22 0850 10/25/22 0710  NA 139 140  K 3.7 4.0  CL 107 107  CO2 28* 27*  BUN 17 25*  CREATININE 1.2* 1.3*  CALCIUM 9.1 9.4   Recent Labs    05/31/22 0850 10/25/22 0710  AST 18 13  ALT 16 8  ALKPHOS 92 78  ALBUMIN 3.7 3.8   Recent Labs    05/31/22 0850 10/25/22 0710  WBC 5.7 4.0  NEUTROABS  --  2,748.00  HGB 7.9* 8.5*  HCT 26* 27*  PLT 96* 78*   Lab Results  Component Value Date   TSH 3.25 05/31/2022   No results found for: "HGBA1C" Lab Results  Component Value Date   CHOL 125 10/25/2022   HDL 39 10/25/2022   LDLCALC 69 10/25/2022   TRIG 89 10/25/2022   CHOLHDL 3.8 11/23/2018    Significant Diagnostic Results in last 30 days:  No results found.  Assessment/Plan 1. Chronic bilateral low back pain without sciatica - ongoing - h/o compression fracture and scoliosis - cont tylenol, gabapentin, voltaren gel and oxycodone prn  2. Essential hypertension - controlled - cont losartan  3. Mixed hyperlipidemia - total 125, LDL 69 10/25/2022 - statin discontinued per goals of care  4. Stage 3a chronic kidney disease (HCC) - encourage hydration with water - avoid NSAIDS  5. Diastolic dysfunction - compensated - cont torsemide M-F  6. 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 Acquired hypothyroidism - TSH stable - cont levothyroxine  8. Pancytopenia (HCC) - no further work up per goals of care - cont ferrous sulfate 2x/week  9. Depression with anxiety - no mood changes or panic - off xanax prn due to non use - cont Wellbutrin and Zoloft   Family/ staff Communication: plan discussed with patient and nurse  Labs/tests ordered:  none

## 2023-03-17 ENCOUNTER — Non-Acute Institutional Stay (SKILLED_NURSING_FACILITY): Payer: Medicare Other | Admitting: Internal Medicine

## 2023-03-17 ENCOUNTER — Encounter: Payer: Self-pay | Admitting: Internal Medicine

## 2023-03-17 DIAGNOSIS — R2681 Unsteadiness on feet: Secondary | ICD-10-CM

## 2023-03-17 DIAGNOSIS — I10 Essential (primary) hypertension: Secondary | ICD-10-CM | POA: Diagnosis not present

## 2023-03-17 DIAGNOSIS — F418 Other specified anxiety disorders: Secondary | ICD-10-CM

## 2023-03-17 DIAGNOSIS — I5189 Other ill-defined heart diseases: Secondary | ICD-10-CM

## 2023-03-17 DIAGNOSIS — G8929 Other chronic pain: Secondary | ICD-10-CM | POA: Diagnosis not present

## 2023-03-17 DIAGNOSIS — N1831 Chronic kidney disease, stage 3a: Secondary | ICD-10-CM | POA: Diagnosis not present

## 2023-03-17 DIAGNOSIS — K219 Gastro-esophageal reflux disease without esophagitis: Secondary | ICD-10-CM | POA: Diagnosis not present

## 2023-03-17 DIAGNOSIS — E039 Hypothyroidism, unspecified: Secondary | ICD-10-CM

## 2023-03-17 DIAGNOSIS — M545 Low back pain, unspecified: Secondary | ICD-10-CM | POA: Diagnosis not present

## 2023-03-17 DIAGNOSIS — D61818 Other pancytopenia: Secondary | ICD-10-CM | POA: Diagnosis not present

## 2023-03-17 NOTE — Progress Notes (Signed)
[facility]  S[facility] (31)  Provider:   Code Status: DNR Goals of Care:     02/21/2023   10:26 AM  Advanced Directives  Does Patient Have a Medical Advance Directive? Yes  Type of Estate agent of [place];Out of facility DNR (pink MOST or yellow form)  Does patient want to make changes to medical advance directive? No - Patient declined  Copy of Healthcare Power of Attorney in Chart? Yes - validated most recent copy scanned in chart (See row information)     Chief Complaint  Patient presents with   Care Management    HPI: Patient is a 89 y.o. female seen today for medical management of chronic diseases.    Lives in SNF  Patient has h/o Pancytopenia , h/o Chronic Back Pain, Hypertension, Hypothyroidism, Hypertension, Recurrent Cystitis, Osteoporosis and Major Depression   Right Trimalleolar Fracture. S/P ORIF in 09/22 Since then Wheelchair dependent  Mild Cognitive decline  Stable No New Issues Continue to feel more Fatigue Stays in her Wheelchair Does not walk anymore Cognitively stable Wt Readings from Last 3 Encounters:  03/17/23 150 lb (68 kg)  02/21/23 150 lb 9.6 oz (68.3 kg)  01/20/23 154 lb 3.2 oz (69.9 kg)     Past Medical History:  Diagnosis Date   Anemia    Anxiety    Chronotropic incompetence 12/2012    potentially medication related; noted on CPET    DDD (degenerative disc disease)    Eczema    GERD (gastroesophageal reflux disease)    H/O hiatal hernia    Hx: UTI (urinary tract infection)    Hyperlipidemia    Hypertension    Hypothyroidism    Osteopenia    Septicemia (HCC) 2002   following UTI   Vertigo, benign positional     Past Surgical History:  Procedure Laterality Date   BREAST SURGERY     left biopsy   CATARACT EXTRACTION Right    2 weeks ago   CPET / MET - PFTS     Consistent with chronotropic incompetence; See attached report in the results section   DILATION AND  CURETTAGE OF UTERUS     x 2   DOPPLER ECHOCARDIOGRAPHY  01/10/2013   Normal LV size and function. Normal EF. Air sclerosis but no stenosis   ESOPHAGOGASTRODUODENOSCOPY  05/04/2012   Procedure: ESOPHAGOGASTRODUODENOSCOPY (EGD);  Surgeon: Louis Meckel, MD;  Location: [facility];  Service: Endoscopy;  Laterality: N/A;   EYE SURGERY     cataract extraction with ILO  ? eye   IR KYPHO EA ADDL LEVEL THORACIC OR LUMBAR  10/28/2016   IR KYPHO LUMBAR INC FX REDUCE BONE BX UNI/BIL CANNULATION INC/IMAGING  10/28/2016   IR KYPHO THORACIC WITH BONE BIOPSY  12/24/2016   IR RADIOLOGIST EVAL & MGMT  11/11/2016   KNEE ARTHROSCOPY  2011   right   ORIF ANKLE FRACTURE Right 03/27/2021   Procedure: OPEN REDUCTION INTERNAL FIXATION (ORIF) ANKLE FRACTURE;  Surgeon: Teryl Lucy, MD;  Location: [facility];  Service: Orthopedics;  Laterality: Right;   TONSILLECTOMY     TOTAL KNEE ARTHROPLASTY  04/24/2012   Procedure: TOTAL KNEE ARTHROPLASTY;  Surgeon: Loanne Drilling, MD;  Location: [facility];  Service: Orthopedics;  Laterality: Right;   TRANSTHORACIC ECHOCARDIOGRAM  02/2018   Ordered by PCP for "congestive heart failure " -EF 60 M 65%.  GR 1 DD.  No R WMA--- > ESSENTIALLY NORMAL    No  Known Allergies  Outpatient Encounter Medications as of 03/17/2023  Medication Sig   acetaminophen (TYLENOL) 500 MG tablet Take 1,000 mg by mouth 3 (three) times daily. For pain   buPROPion (WELLBUTRIN SR) 150 MG 12 hr tablet Take 1 tablet (150 mg total) by mouth 2 (two) times daily after a meal.   calcium citrate-vitamin D (CITRACAL+D) 315-200 MG-UNIT tablet Take 1 tablet by mouth 2 (two) times daily.   Camphor-Menthol-Methyl Sal 3.07-31-08 % PTCH Place 1 patch onto the skin See admin instructions. Apply one patch onto the skin of the  lower back daily for back pain   Carboxymethylcellulose Sodium (ARTIFICIAL TEARS OP) Place 1 drop into both eyes in the morning, at noon, and at bedtime.   diclofenac Sodium (VOLTAREN) 1 % GEL Apply 4 g  topically in the morning and at bedtime. Apply to bilateral ankles BID   ferrous sulfate 325 (65 FE) MG EC tablet Take 325 mg by mouth. 2/ week   gabapentin (NEURONTIN) 100 MG capsule Take 200 mg by mouth at bedtime.   hydrocortisone cream 1 % Apply 1 Application topically as needed for itching. Apply to skin topically as needed for itching   levothyroxine (SYNTHROID) 75 MCG tablet Take 75 mcg by mouth every morning.   losartan (COZAAR) 25 MG tablet Take 12.5 mg by mouth every morning.   Menthol, Topical Analgesic, (BIOFREEZE) 4 % GEL Apply topically 2 (two) times daily as needed. Apply thin layer to left thigh   nystatin (MYCOSTATIN/NYSTOP) powder Apply 1 application  topically as needed (yeast).   nystatin cream (MYCOSTATIN) Apply 1 Application topically as needed.   omeprazole (PRILOSEC) 40 MG capsule Take 40 mg by mouth every morning.   OxyCODONE HCl, Abuse Deter, (OXAYDO) 5 MG TABA Take 2.5 mg by mouth as needed. Give 30 minutes before transportation to ortho appointments.   OXYGEN Inhale into the lungs. For SOB to keep 02 sats >90%; every shift as needed for SOB   polyethylene glycol (MIRALAX / GLYCOLAX) 17 g packet Take 17 g by mouth as directed. Every 2 days   potassium chloride SA (KLOR-CON M) 20 MEQ tablet Take 20 mEq by mouth as directed. Monday, Tuesday, Wednesday, Thursday, and Friday.   senna (SENOKOT) 8.6 MG TABS tablet Take 2 tablets by mouth at bedtime.   sertraline (ZOLOFT) 25 MG tablet Take 50 mg by mouth every morning.   torsemide (DEMADEX) 20 MG tablet Take 20 mg by mouth as directed. Once A Day on Mon, Tue, Wed, Thu, Fri   zinc oxide 20 % ointment Apply 1 application topically See admin instructions. Apply topically to per/buttocks after every incontinent episode and as needed for redness   No facility-administered encounter medications on file as of 03/17/2023.    Review of Systems:  Review of Systems  Constitutional:  Negative for activity change and appetite change.   HENT: Negative.    Respiratory:  Negative for cough and shortness of breath.   Cardiovascular:  Negative for leg swelling.  Gastrointestinal:  Negative for constipation.  Genitourinary: Negative.   Musculoskeletal:  Positive for back pain and gait problem. Negative for arthralgias and myalgias.  Skin: Negative.   Neurological:  Positive for weakness. Negative for dizziness.  Psychiatric/Behavioral:  Negative for confusion, dysphoric mood and sleep disturbance.     Health Maintenance  Topic Date Due   INFLUENZA VACCINE  02/24/2023   COVID-19 Vaccine (8 - 2023-24 season) 03/25/2023 (Originally 07/27/2022)   Pneumonia Vaccine 32+ Years old (2 of 2 -  PCV) 08/06/2023 (Originally 05/14/2020)   OPHTHALMOLOGY EXAM  03/24/2023   Medicare Annual Wellness (AWV)  09/23/2023   DTaP/Tdap/Td (3 - Td or Tdap) 05/14/2029   DEXA SCAN  Completed   Zoster Vaccines- Shingrix  Completed   HPV VACCINES  Aged Out    Physical Exam: Vitals:   03/17/23 1322  BP: 130/65  Pulse: 72  Temp: (!) 96.9 F (36.1 C)  Weight: 150 lb (68 kg)   Body mass index is 25.75 kg/m. Physical Exam Vitals reviewed.  Constitutional:      Appearance: Normal appearance.  HENT:     Head: Normocephalic.     Nose: Nose normal.     Mouth/Throat:     Mouth: Mucous membranes are moist.     Pharynx: Oropharynx is clear.  Eyes:     Pupils: Pupils are equal, round, and reactive to light.  Cardiovascular:     Rate and Rhythm: Normal rate and regular rhythm.     Pulses: Normal pulses.     Heart sounds: Normal heart sounds. No murmur heard. Pulmonary:     Effort: Pulmonary effort is normal.     Breath sounds: Normal breath sounds.     Comments: Few rales in the base Abdominal:     General: Abdomen is flat. Bowel sounds are normal.     Palpations: Abdomen is soft.  Musculoskeletal:        General: No swelling.     Cervical back: Neck supple.  Skin:    General: Skin is warm.  Neurological:     General: No focal  deficit present.     Mental Status: She is alert and oriented to person, place, and time.  Psychiatric:        Mood and Affect: Mood normal.        Thought Content: Thought content normal.    Labs reviewed: Basic Metabolic Panel: Recent Labs    05/31/22 0850 10/25/22 0710  NA 139 140  K 3.7 4.0  CL 107 107  CO2 28* 27*  BUN 17 25*  CREATININE 1.2* 1.3*  CALCIUM 9.1 9.4  TSH 3.25  --    Liver Function Tests: Recent Labs    05/31/22 0850 10/25/22 0710  AST 18 13  ALT 16 8  ALKPHOS 92 78  ALBUMIN 3.7 3.8   No results for input(s): "LIPASE", "AMYLASE" in the last 8760 hours. No results for input(s): "AMMONIA" in the last 8760 hours. CBC: Recent Labs    05/31/22 0850 10/25/22 0710  WBC 5.7 4.0  NEUTROABS  --  2,748.00  HGB 7.9* 8.5*  HCT 26* 27*  PLT 96* 78*   Lipid Panel: Recent Labs    10/25/22 0710  CHOL 125  HDL 39  LDLCALC 69  TRIG 89   No results found for: "HGBA1C"  Procedures since last visit: No results found.  Assessment/Plan 1. Essential hypertension BP controlled on Losartan  2. Chronic bilateral low back pain without sciatica Neurontin Dose was lowered as per request of family due to Excessive sleepiness  3. Stage 3a chronic kidney disease (HCC) Crest stable  4. Diastolic dysfunction Low dose of Torsemide  5. Acquired hypothyroidism TSH normal in 11/23  6. Pancytopenia (HCC) On Iron No More work up per Hematology    7. Depression with anxiety Zoloft and Wellbutrin  8 Unstable Gait Stays in the wheelchair mostly 9 GERD Prilosec  Labs/tests ordered:   Next appt:  Visit date not found

## 2023-04-13 ENCOUNTER — Non-Acute Institutional Stay (SKILLED_NURSING_FACILITY): Payer: Medicare Other | Admitting: Orthopedic Surgery

## 2023-04-13 ENCOUNTER — Encounter: Payer: Self-pay | Admitting: Orthopedic Surgery

## 2023-04-13 DIAGNOSIS — N1831 Chronic kidney disease, stage 3a: Secondary | ICD-10-CM | POA: Diagnosis not present

## 2023-04-13 DIAGNOSIS — D61818 Other pancytopenia: Secondary | ICD-10-CM

## 2023-04-13 DIAGNOSIS — E039 Hypothyroidism, unspecified: Secondary | ICD-10-CM | POA: Diagnosis not present

## 2023-04-13 DIAGNOSIS — E782 Mixed hyperlipidemia: Secondary | ICD-10-CM | POA: Diagnosis not present

## 2023-04-13 DIAGNOSIS — I10 Essential (primary) hypertension: Secondary | ICD-10-CM | POA: Diagnosis not present

## 2023-04-13 DIAGNOSIS — M545 Low back pain, unspecified: Secondary | ICD-10-CM

## 2023-04-13 DIAGNOSIS — I5189 Other ill-defined heart diseases: Secondary | ICD-10-CM | POA: Diagnosis not present

## 2023-04-13 DIAGNOSIS — G8929 Other chronic pain: Secondary | ICD-10-CM | POA: Diagnosis not present

## 2023-04-13 DIAGNOSIS — F339 Major depressive disorder, recurrent, unspecified: Secondary | ICD-10-CM

## 2023-04-13 DIAGNOSIS — F0392 Unspecified dementia, unspecified severity, with psychotic disturbance: Secondary | ICD-10-CM

## 2023-04-13 NOTE — Progress Notes (Signed)
Location:  [facility] Room Number: N35-A Place of Service:  SNF [phone]) Provider:  Hazle Nordmann, NP  Mahlon Gammon, MD  Patient Care Team: Mahlon Gammon, MD as PCP - General (Internal Medicine) Mast, Man X, NP as Nurse Practitioner (Internal Medicine)  Extended Emergency Contact Information Primary Emergency Contact: Lascano,[name] Address: [address]          [place], [place] [address] [place] Home Phone: [phone] Mobile Phone: [phone] Relation: Son Secondary Emergency Contact: [place], Cindra Eves [place] Home Phone: [phone] Mobile Phone: [phone] Relation: Other  Code Status:  DNR Goals of care: Advanced Directive information    04/13/2023   11:12 AM  Advanced Directives  Does Patient Have a Medical Advance Directive? Yes  Type of Estate agent of [place];Out of facility DNR (pink MOST or yellow form);Living will  Does patient want to make changes to medical advance directive? Yes (Inpatient - patient defers changing a medical advance directive at this time - Information given)  Copy of Healthcare Power of Attorney in Chart? Yes - validated most recent copy scanned in chart (See row information)     Chief Complaint  Patient presents with   Follow-up    HPI:  Pt is a 89 y.o. female seen today for medical management of chronic conditions.   She currently resides on the skilled nursing unit at [facility]. PMH: HTN, HLD, PVD, CKD, esophagitis, GERD, GI bleed, thrombocytopenia, pancytopenia, hypothyroidism, OA, L2 compression fracture, right trimalleolar fracture s/p ORIF 03/2021, chronic back pain, depression and anxiety.    HTN- BUN/creat 25/1.3 10/25/2022, remains on losartan HLD- LDL 69 10/25/2022, statin discontinued last month per goals of care CKD- see above Diastolic dysfunction- BNP 289 64/40/3474, Echo 03/18 noted normal LV systolic function and mild LV hypertrophy, some sob  with exertion, remains in torsemide M-F Hallucinations with dementia- BIMS score 15/15 (09/05)> was 15/15 (06/06), CT head noted chronic microvascular ischemic disease 04/16/2022, intermittent confusion with hallucinations, remains on xanax prn Hypothyroidism- TSH 3.25 05/31/2022, remains on levothyroxine Pancytopenia- no further workup, remains on ferrous sulfate 2x/week Depression- no mood changes or panic attacks, Na+ 140 10/25/2022, very supportive family,  remains on Wellbutrin and Zoloft Chronic back pain- h/o compression fracture T11/L1/L2 and scoliosis, remains on tylenol, gabapentin, voltaren gel and oxycodone prn  No recent falls or injuries.   Recent blood pressures:  09/17- 133/71  09/10- 127/72  09/03- 117/64  Recent weights:  09/02- 148.7 lbs  08/01- 149.9 lbs  07/01- 150.6 lbs      Past Medical History:  Diagnosis Date   Anemia    Anxiety    Chronotropic incompetence 12/2012    potentially medication related; noted on CPET    DDD (degenerative disc disease)    Eczema    GERD (gastroesophageal reflux disease)    H/O hiatal hernia    Hx: UTI (urinary tract infection)    Hyperlipidemia    Hypertension    Hypothyroidism    Osteopenia    Septicemia (HCC) 2002   following UTI   Vertigo, benign positional    Past Surgical History:  Procedure Laterality Date   BREAST SURGERY     left biopsy   CATARACT EXTRACTION Right    2 weeks ago   CPET / MET - PFTS     Consistent with chronotropic incompetence; See attached report in the results section   DILATION AND CURETTAGE OF UTERUS     x  2   DOPPLER ECHOCARDIOGRAPHY  01/10/2013   Normal LV size and function. Normal EF. Air sclerosis but no stenosis   ESOPHAGOGASTRODUODENOSCOPY  05/04/2012   Procedure: ESOPHAGOGASTRODUODENOSCOPY (EGD);  Surgeon: Louis Meckel, MD;  Location: [facility];  Service: Endoscopy;  Laterality: N/A;   EYE SURGERY     cataract extraction with ILO  ? eye   IR KYPHO EA ADDL LEVEL  THORACIC OR LUMBAR  10/28/2016   IR KYPHO LUMBAR INC FX REDUCE BONE BX UNI/BIL CANNULATION INC/IMAGING  10/28/2016   IR KYPHO THORACIC WITH BONE BIOPSY  12/24/2016   IR RADIOLOGIST EVAL & MGMT  11/11/2016   KNEE ARTHROSCOPY  2011   right   ORIF ANKLE FRACTURE Right 03/27/2021   Procedure: OPEN REDUCTION INTERNAL FIXATION (ORIF) ANKLE FRACTURE;  Surgeon: Teryl Lucy, MD;  Location: [facility];  Service: Orthopedics;  Laterality: Right;   TONSILLECTOMY     TOTAL KNEE ARTHROPLASTY  04/24/2012   Procedure: TOTAL KNEE ARTHROPLASTY;  Surgeon: Loanne Drilling, MD;  Location: [facility];  Service: Orthopedics;  Laterality: Right;   TRANSTHORACIC ECHOCARDIOGRAM  02/2018   Ordered by PCP for "congestive heart failure " -EF 60 M 65%.  GR 1 DD.  No R WMA--- > ESSENTIALLY NORMAL    No Known Allergies  Outpatient Encounter Medications as of 04/13/2023  Medication Sig   acetaminophen (TYLENOL) 500 MG tablet Take 1,000 mg by mouth 3 (three) times daily. For pain   ALPRAZolam (XANAX) 0.25 MG tablet Take 0.25 mg by mouth daily as needed for anxiety.   buPROPion (WELLBUTRIN SR) 150 MG 12 hr tablet Take 1 tablet (150 mg total) by mouth 2 (two) times daily after a meal.   calcium citrate-vitamin D (CITRACAL+D) 315-200 MG-UNIT tablet Take 1 tablet by mouth 2 (two) times daily.   Camphor-Menthol-Methyl Sal 3.07-31-08 % PTCH Place 1 patch onto the skin See admin instructions. Apply one patch onto the skin of the  lower back daily for back pain   Carboxymethylcellulose Sodium (ARTIFICIAL TEARS OP) Place 1 drop into both eyes in the morning, at noon, and at bedtime.   diclofenac Sodium (VOLTAREN) 1 % GEL Apply 4 g topically in the morning and at bedtime. Apply to bilateral ankles BID   ferrous sulfate 325 (65 FE) MG EC tablet Take 325 mg by mouth. 2/ week   gabapentin (NEURONTIN) 100 MG capsule Take 200 mg by mouth at bedtime.   hydrocortisone cream 1 % Apply 1 Application topically as needed for itching. Apply to skin topically as  needed for itching   levothyroxine (SYNTHROID) 75 MCG tablet Take 75 mcg by mouth every morning.   losartan (COZAAR) 25 MG tablet Take 12.5 mg by mouth every morning.   Menthol, Topical Analgesic, (BIOFREEZE) 4 % GEL Apply topically 2 (two) times daily as needed. Apply thin layer to left thigh   nystatin (MYCOSTATIN/NYSTOP) powder Apply 1 application  topically as needed (yeast).   nystatin cream (MYCOSTATIN) Apply 1 Application topically as needed.   omeprazole (PRILOSEC) 40 MG capsule Take 40 mg by mouth every morning.   OxyCODONE HCl, Abuse Deter, (OXAYDO) 5 MG TABA Take 2.5 mg by mouth as needed. Give 30 minutes before transportation to ortho appointments.   OXYGEN Inhale into the lungs. For SOB to keep 02 sats >90%; every shift as needed for SOB   polyethylene glycol (MIRALAX / GLYCOLAX) 17 g packet Take 17 g by mouth as directed. Every 2 days   potassium chloride SA (KLOR-CON M) 20  MEQ tablet Take 20 mEq by mouth as directed. Monday, Tuesday, Wednesday, Thursday, and Friday.   sertraline (ZOLOFT) 25 MG tablet Take 50 mg by mouth every morning.   torsemide (DEMADEX) 20 MG tablet Take 20 mg by mouth as directed. Once A Day on Mon, Tue, Wed, Thu, Fri   zinc oxide 20 % ointment Apply 1 application topically See admin instructions. Apply topically to per/buttocks after every incontinent episode and as needed for redness   [DISCONTINUED] senna (SENOKOT) 8.6 MG TABS tablet Take 2 tablets by mouth at bedtime.   No facility-administered encounter medications on file as of 04/13/2023.    Review of Systems  Constitutional:  Negative for activity change and appetite change.  HENT:  Negative for sore throat and trouble swallowing.   Eyes:  Negative for visual disturbance.  Respiratory:  Negative for cough, shortness of breath and wheezing.   Cardiovascular:  Negative for chest pain and leg swelling.  Gastrointestinal:  Positive for constipation. Negative for abdominal distention and abdominal pain.   Genitourinary:  Negative for dysuria, frequency and hematuria.  Musculoskeletal:  Positive for back pain and gait problem.  Skin:  Negative for wound.  Neurological:  Positive for weakness. Negative for dizziness and headaches.  Psychiatric/Behavioral:  Positive for confusion and dysphoric mood. Negative for sleep disturbance. The patient is nervous/anxious.     Immunization History  Administered Date(s) Administered   Fluad Quad(high Dose 65+) 05/19/2022   Influenza, High Dose Seasonal PF 04/26/2016, 03/22/2017, 05/08/2019   Influenza,inj,Quad PF,6+ Mos 04/27/2018   Influenza-Unspecified 04/22/2011, 04/04/2012, 05/03/2013, 05/04/2013, 04/17/2014, 04/19/2015, 04/26/2016, 04/22/2017, 05/20/2021   Moderna SARS-COV2 Booster Vaccination 06/09/2020, 12/31/2020   Moderna Sars-Covid-2 Vaccination 07/30/2019, 08/27/2019, 01/06/2022   PFIZER(Purple Top)SARS-COV-2 Vaccination 06/01/2022   Pfizer Covid-19 Vaccine Bivalent Booster 73yrs & up 04/15/2021   Pneumococcal Polysaccharide-23 08/26/2003, 09/06/2011, 05/15/2019   Pneumococcal-Unspecified 12/20/2013   Td 09/26/2006   Tdap 05/15/2019   Zoster Recombinant(Shingrix) 11/21/2017, 04/08/2018   Zoster, Live 10/25/2007   Pertinent  Health Maintenance Due  Topic Date Due   INFLUENZA VACCINE  02/24/2023   OPHTHALMOLOGY EXAM  03/24/2023   DEXA SCAN  Completed      10/06/2022   11:22 AM 10/11/2022   10:15 AM 10/20/2022   10:35 AM 10/26/2022    9:26 AM 01/20/2023    3:50 PM  Fall Risk  Falls in the past year? 1 1 1  0 0  Was there an injury with Fall? 0 0 0 0 0  Fall Risk Category Calculator 2 2 1  0 0  Patient at Risk for Falls Due to History of fall(s);Impaired balance/gait;Impaired mobility;Mental status change History of fall(s);Impaired balance/gait;Impaired mobility History of fall(s);Impaired balance/gait;Impaired mobility History of fall(s);Impaired balance/gait;Impaired mobility   Fall risk Follow up Falls evaluation completed;Education  provided;Falls prevention discussed Falls evaluation completed;Education provided;Falls prevention discussed Falls evaluation completed;Education provided;Falls prevention discussed Falls evaluation completed    Functional Status Survey:    Vitals:   04/13/23 1107  BP: 133/71  Pulse: 98  Resp: 18  Temp: (!) 96 F (35.6 C)  SpO2: 96%   There is no height or weight on file to calculate BMI. Physical Exam Vitals reviewed.  Constitutional:      General: She is not in acute distress. HENT:     Head: Normocephalic.     Right Ear: There is no impacted cerumen.     Left Ear: There is no impacted cerumen.     Nose: Nose normal.     Mouth/Throat:  Mouth: Mucous membranes are moist.  Eyes:     General:        Right eye: No discharge.        Left eye: No discharge.  Cardiovascular:     Rate and Rhythm: Normal rate and regular rhythm.     Pulses: Normal pulses.     Heart sounds: Normal heart sounds.  Pulmonary:     Effort: Pulmonary effort is normal. No respiratory distress.     Breath sounds: Normal breath sounds. No wheezing or rales.  Abdominal:     General: Bowel sounds are normal. There is no distension.     Palpations: Abdomen is soft.     Tenderness: There is no abdominal tenderness.  Musculoskeletal:     Cervical back: Neck supple.     Right lower leg: No edema.     Left lower leg: No edema.     Comments: Kyphosis, poor posture  Skin:    General: Skin is warm.     Capillary Refill: Capillary refill takes less than 2 seconds.  Neurological:     General: No focal deficit present.     Mental Status: She is alert and oriented to person, place, and time.     Motor: Weakness present.     Gait: Gait abnormal.     Comments: wheelchair  Psychiatric:        Mood and Affect: Mood normal.        Behavior: Behavior normal.     Labs reviewed: Recent Labs    05/31/22 0850 10/25/22 0710  NA 139 140  K 3.7 4.0  CL 107 107  CO2 28* 27*  BUN 17 25*  CREATININE 1.2*  1.3*  CALCIUM 9.1 9.4   Recent Labs    05/31/22 0850 10/25/22 0710  AST 18 13  ALT 16 8  ALKPHOS 92 78  ALBUMIN 3.7 3.8   Recent Labs    05/31/22 0850 10/25/22 0710  WBC 5.7 4.0  NEUTROABS  --  2,748.00  HGB 7.9* 8.5*  HCT 26* 27*  PLT 96* 78*   Lab Results  Component Value Date   TSH 3.25 05/31/2022   No results found for: "HGBA1C" Lab Results  Component Value Date   CHOL 125 10/25/2022   HDL 39 10/25/2022   LDLCALC 69 10/25/2022   TRIG 89 10/25/2022   CHOLHDL 3.8 11/23/2018    Significant Diagnostic Results in last 30 days:  No results found.  Assessment/Plan 1. Essential hypertension - controlled with losartan - BUN/creat 25/1.3 10/25/2022  2. Mixed hyperlipidemia - LDL 69 10/2022 - off statin due to goals of care  3. Stage 3a chronic kidney disease (HCC) - encourage hydration with water - avoid NSAIDS  4. Diastolic dysfunction - compensated - cont torsemide M/W/F  5. Hallucinations due to late onset dementia (HCC) - no recent episodes - cont xanax prn  6. Acquired hypothyroidism - TSH stable - cont levothyroxine  7. Pancytopenia (HCC) - hgb stable> no further work up - cont ferrous sulfate  8. Depression, recurrent (HCC) - no mood changes - very supportive family - cont Wellbutrin and Zoloft  9. Chronic bilateral low back pain without sciatica - ongoing - kyphosis, poor posture - cont tylenol, gabapentin, voltaren gel and oxycodone prn     Family/ staff Communication: plan discussed with patient and nurse  Labs/tests ordered:  cbc/diff, cmp, tsh 10/03

## 2023-04-21 ENCOUNTER — Encounter: Payer: Self-pay | Admitting: Internal Medicine

## 2023-04-21 ENCOUNTER — Non-Acute Institutional Stay (SKILLED_NURSING_FACILITY): Payer: Medicare Other | Admitting: Internal Medicine

## 2023-04-21 DIAGNOSIS — T148XXA Other injury of unspecified body region, initial encounter: Secondary | ICD-10-CM

## 2023-04-21 DIAGNOSIS — D61818 Other pancytopenia: Secondary | ICD-10-CM

## 2023-04-21 DIAGNOSIS — I10 Essential (primary) hypertension: Secondary | ICD-10-CM
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
## 2023-04-28 DIAGNOSIS — D649 Anemia, unspecified: Secondary | ICD-10-CM | POA: Diagnosis not present

## 2023-04-28 DIAGNOSIS — E039 Hypothyroidism, unspecified: Secondary | ICD-10-CM | POA: Diagnosis not present

## 2023-04-28 LAB — CBC: RBC: 3.67 — AB (ref 3.87–5.11)

## 2023-04-28 LAB — BASIC METABOLIC PANEL
BUN: 22 — AB (ref 4–21)
CO2: 24 — AB (ref 13–22)
Chloride: 102 (ref 99–108)
Creatinine: 1.2 — AB (ref 0.5–1.1)
Glucose: 100
Potassium: 4.2 meq/L (ref 3.5–5.1)
Sodium: 139 (ref 137–147)

## 2023-04-28 LAB — COMPREHENSIVE METABOLIC PANEL
Albumin: 4.1 (ref 3.5–5.0)
Calcium: 9.6 (ref 8.7–10.7)
Globulin: 3
eGFR: 44

## 2023-04-28 LAB — HEPATIC FUNCTION PANEL
ALT: 8 U/L (ref 7–35)
AST: 11 — AB (ref 13–35)
Alkaline Phosphatase: 77 (ref 25–125)
Bilirubin, Total: 0.3

## 2023-04-28 LAB — TSH: TSH: 3.56 (ref 0.41–5.90)

## 2023-04-28 LAB — CBC AND DIFFERENTIAL
HCT: 33 — AB (ref 36–46)
Hemoglobin: 10.4 — AB (ref 12.0–16.0)
Neutrophils Absolute: 2111
Platelets: 84 10*3/uL — AB (ref 150–400)
WBC: 3.5

## 2023-05-13 ENCOUNTER — Encounter: Payer: Self-pay | Admitting: Internal Medicine

## 2023-05-13 ENCOUNTER — Non-Acute Institutional Stay (SKILLED_NURSING_FACILITY): Payer: Medicare Other | Admitting: Internal Medicine

## 2023-05-13 DIAGNOSIS — J069 Acute upper respiratory infection, unspecified: Secondary | ICD-10-CM | POA: Diagnosis not present

## 2023-05-13 NOTE — Progress Notes (Signed)
[facility] Service:  SNF (31)  Provider:   Code Status: DNR Goals of Care:     04/21/2023    2:34 PM  Advanced Directives  Does Patient Have a Medical Advance Directive? Yes  Type of Estate agent of [place];Out of facility DNR (pink MOST or yellow form)  Does patient want to make changes to medical advance directive? No - Patient declined  Copy of Healthcare Power of Attorney in Chart? Yes - validated most recent copy scanned in chart (See row information)     Chief Complaint  Patient presents with   Acute Visit    HPI: Patient is a 89 y.o. female seen today for an acute visit for Cough Congestion And Hoarse Voice  Lives in [facility] in [facility]  Noticed to have Hoarse voice this morning with Cough and Mucus No Chest pain Fever or SOB  Other issues Patient has h/o Pancytopenia , h/o Chronic Back Pain, Hypertension, Hypothyroidism, Hypertension, Recurrent Cystitis, Osteoporosis and Major Depression   Right Trimalleolar Fracture. S/P ORIF in 09/22 Since then Wheelchair dependent Mild Cognitive decline  Past Medical History:  Diagnosis Date   Anemia    Anxiety    Chronotropic incompetence 12/2012    potentially medication related; noted on CPET    DDD (degenerative disc disease)    Eczema    GERD (gastroesophageal reflux disease)    H/O hiatal hernia    Hx: UTI (urinary tract infection)    Hyperlipidemia    Hypertension    Hypothyroidism    Osteopenia    Septicemia (HCC) 2002   following UTI   Vertigo, benign positional     Past Surgical History:  Procedure Laterality Date   BREAST SURGERY     left biopsy   CATARACT EXTRACTION Right    2 weeks ago   CPET / MET - PFTS     Consistent with chronotropic incompetence; See attached report in the results section   DILATION AND CURETTAGE OF UTERUS     x 2   DOPPLER ECHOCARDIOGRAPHY  01/10/2013   Normal LV size and function. Normal EF. Air sclerosis but no stenosis    ESOPHAGOGASTRODUODENOSCOPY  05/04/2012   Procedure: ESOPHAGOGASTRODUODENOSCOPY (EGD);  Surgeon: Louis Meckel, MD;  Location: [facility];  Service: Endoscopy;  Laterality: N/A;   EYE SURGERY     cataract extraction with ILO  ? eye   IR KYPHO EA ADDL LEVEL THORACIC OR LUMBAR  10/28/2016   IR KYPHO LUMBAR INC FX REDUCE BONE BX UNI/BIL CANNULATION INC/IMAGING  10/28/2016   IR KYPHO THORACIC WITH BONE BIOPSY  12/24/2016   IR RADIOLOGIST EVAL & MGMT  11/11/2016   KNEE ARTHROSCOPY  2011   right   ORIF ANKLE FRACTURE Right 03/27/2021   Procedure: OPEN REDUCTION INTERNAL FIXATION (ORIF) ANKLE FRACTURE;  Surgeon: Teryl Lucy, MD;  Location: [facility];  Service: Orthopedics;  Laterality: Right;   TONSILLECTOMY     TOTAL KNEE ARTHROPLASTY  04/24/2012   Procedure: TOTAL KNEE ARTHROPLASTY;  Surgeon: Loanne Drilling, MD;  Location: [facility];  Service: Orthopedics;  Laterality: Right;   TRANSTHORACIC ECHOCARDIOGRAM  02/2018   Ordered by PCP for "congestive heart failure " -EF 60 M 65%.  GR 1 DD.  No R WMA--- > ESSENTIALLY NORMAL    No Known Allergies  Outpatient Encounter Medications as of 05/13/2023  Medication Sig   acetaminophen (TYLENOL) 500 MG tablet Take 1,000 mg by mouth 3 (three) times daily.  For pain   ALPRAZolam (XANAX) 0.25 MG tablet Take 0.25 mg by mouth every 12 (twelve) hours as needed for anxiety.   buPROPion (WELLBUTRIN SR) 150 MG 12 hr tablet Take 1 tablet (150 mg total) by mouth 2 (two) times daily after a meal.   calcium citrate-vitamin D (CITRACAL+D) 315-200 MG-UNIT tablet Take 1 tablet by mouth 2 (two) times daily.   Camphor-Menthol-Methyl Sal 3.07-31-08 % PTCH Place 1 patch onto the skin See admin instructions. Apply one patch onto the skin of the  lower back daily for back pain   Carboxymethylcellulose Sodium (ARTIFICIAL TEARS OP) Place 1 drop into both eyes in the morning, at noon, and at bedtime.   diclofenac Sodium (VOLTAREN) 1 % GEL Apply 4 g topically in the morning and at bedtime.  Apply to bilateral ankles BID   ferrous sulfate 325 (65 FE) MG EC tablet Take 325 mg by mouth. 2/ week   gabapentin (NEURONTIN) 100 MG capsule Take 200 mg by mouth at bedtime.   hydrocortisone cream 1 % Apply 1 Application topically as needed for itching. Apply to skin topically as needed for itching   levothyroxine (SYNTHROID) 75 MCG tablet Take 75 mcg by mouth every morning.   losartan (COZAAR) 25 MG tablet Take 12.5 mg by mouth every morning.   Menthol, Topical Analgesic, (BIOFREEZE) 4 % GEL Apply topically 2 (two) times daily as needed. Apply thin layer to left thigh   nystatin (MYCOSTATIN/NYSTOP) powder Apply 1 application  topically as needed (yeast).   nystatin cream (MYCOSTATIN) Apply 1 Application topically as needed.   omeprazole (PRILOSEC) 40 MG capsule Take 40 mg by mouth every morning.   OxyCODONE HCl, Abuse Deter, (OXAYDO) 5 MG TABA Take 2.5 mg by mouth as needed. Give 30 minutes before transportation to ortho appointments.   OXYGEN Inhale into the lungs. For SOB to keep 02 sats >90%; every shift as needed for SOB   polyethylene glycol (MIRALAX / GLYCOLAX) 17 g packet Take 17 g by mouth as directed. Every 2 days   potassium chloride SA (KLOR-CON M) 20 MEQ tablet Take 20 mEq by mouth as directed. Monday, Tuesday, Wednesday, Thursday, and Friday.   sertraline (ZOLOFT) 25 MG tablet Take 50 mg by mouth every morning.   torsemide (DEMADEX) 20 MG tablet Take 20 mg by mouth as directed. Once A Day on Mon, Tue, Wed, Thu, Fri   zinc oxide 20 % ointment Apply 1 application topically See admin instructions. Apply topically to per/buttocks after every incontinent episode and as needed for redness   No facility-administered encounter medications on file as of 05/13/2023.    Review of Systems:  Review of Systems  Constitutional:  Negative for activity change and appetite change.  HENT: Negative.    Respiratory:  Positive for cough. Negative for shortness of breath.   Cardiovascular:   Negative for leg swelling.  Gastrointestinal:  Negative for constipation.  Genitourinary: Negative.   Musculoskeletal:  Positive for gait problem. Negative for arthralgias and myalgias.  Skin: Negative.   Neurological:  Negative for dizziness and weakness.  Psychiatric/Behavioral:  Positive for confusion. Negative for dysphoric mood and sleep disturbance.     Health Maintenance  Topic Date Due   INFLUENZA VACCINE  02/24/2023   OPHTHALMOLOGY EXAM  03/24/2023   COVID-19 Vaccine (8 - 2023-24 season) 03/27/2023   Medicare Annual Wellness (AWV)  09/23/2023   DTaP/Tdap/Td (3 - Td or Tdap) 05/14/2029   Pneumonia Vaccine 49+ Years old  Completed   DEXA SCAN  Completed   Zoster Vaccines- Shingrix  Completed   HPV VACCINES  Aged Out    Physical Exam: Vitals:   05/13/23 1500  BP: 122/69  Pulse: 67  Resp: 17  Temp: (!) 96.5 F (35.8 C)  Weight: 148 lb 6.4 oz (67.3 kg)   Body mass index is 25.47 kg/m. Physical Exam Vitals reviewed.  Constitutional:      Appearance: Normal appearance.  HENT:     Head: Normocephalic.     Nose: Nose normal.     Mouth/Throat:     Mouth: Mucous membranes are moist.     Pharynx: Oropharynx is clear.  Eyes:     Pupils: Pupils are equal, round, and reactive to light.  Cardiovascular:     Rate and Rhythm: Normal rate and regular rhythm.     Pulses: Normal pulses.     Heart sounds: Normal heart sounds. No murmur heard. Pulmonary:     Effort: Pulmonary effort is normal. No respiratory distress.     Breath sounds: Normal breath sounds. No wheezing or rales.  Abdominal:     General: Abdomen is flat. Bowel sounds are normal.     Palpations: Abdomen is soft.  Musculoskeletal:        General: No swelling.     Cervical back: Neck supple.  Skin:    General: Skin is warm.  Neurological:     General: No focal deficit present.     Mental Status: She is alert.  Psychiatric:        Mood and Affect: Mood normal.        Thought Content: Thought content  normal.     Labs reviewed: Basic Metabolic Panel: Recent Labs    05/31/22 0850 10/25/22 0710  NA 139 140  K 3.7 4.0  CL 107 107  CO2 28* 27*  BUN 17 25*  CREATININE 1.2* 1.3*  CALCIUM 9.1 9.4  TSH 3.25  --    Liver Function Tests: Recent Labs    05/31/22 0850 10/25/22 0710  AST 18 13  ALT 16 8  ALKPHOS 92 78  ALBUMIN 3.7 3.8   No results for input(s): "LIPASE", "AMYLASE" in the last 8760 hours. No results for input(s): "AMMONIA" in the last 8760 hours. CBC: Recent Labs    05/31/22 0850 10/25/22 0710  WBC 5.7 4.0  NEUTROABS  --  2,748.00  HGB 7.9* 8.5*  HCT 26* 27*  PLT 96* 78*   Lipid Panel: Recent Labs    10/25/22 0710  CHOL 125  HDL 39  LDLCALC 69  TRIG 89   No results found for: "HGBA1C"  Procedures since last visit: No results found.  Assessment/Plan 1. URI with cough and congestion Lungs were clear Will start her on Zyrtec and Mucinex for 1 week If Cough does not get better can consider Chest Xray 2 Hypertension On losartan 3 Pancytopenia On iron No More work up per Hematology     Labs/tests ordered:  * No order type specified * Next appt:  Visit date not found

## 2023-05-16 ENCOUNTER — Non-Acute Institutional Stay (SKILLED_NURSING_FACILITY): Payer: Medicare Other | Admitting: Adult Health

## 2023-05-16 ENCOUNTER — Encounter: Payer: Self-pay | Admitting: Adult Health

## 2023-05-16 DIAGNOSIS — R630 Anorexia: Secondary | ICD-10-CM | POA: Diagnosis not present

## 2023-05-16 DIAGNOSIS — I5189 Other ill-defined heart diseases: Secondary | ICD-10-CM | POA: Diagnosis not present

## 2023-05-16 DIAGNOSIS — J069 Acute upper respiratory infection, unspecified: Secondary | ICD-10-CM

## 2023-05-16 DIAGNOSIS — R0602 Shortness of breath: Secondary | ICD-10-CM | POA: Diagnosis not present

## 2023-05-16 DIAGNOSIS — R059 Cough, unspecified: Secondary | ICD-10-CM | POA: Diagnosis not present

## 2023-05-16 LAB — BASIC METABOLIC PANEL
BUN: 17 (ref 4–21)
CO2: 25 — AB (ref 13–22)
Chloride: 99 (ref 99–108)
Creatinine: 1.2 — AB (ref 0.5–1.1)
Glucose: 107
Potassium: 4 meq/L (ref 3.5–5.1)
Sodium: 134 — AB (ref 137–147)

## 2023-05-16 LAB — CBC AND DIFFERENTIAL
HCT: 29 — AB (ref 36–46)
Hemoglobin: 8.9 — AB (ref 12.0–16.0)
Neutrophils Absolute: 10212
Platelets: 67 10*3/uL — AB (ref 150–400)
WBC: 12

## 2023-05-16 LAB — COMPREHENSIVE METABOLIC PANEL
Calcium: 8.6 — AB (ref 8.7–10.7)
eGFR: 45

## 2023-05-16 LAB — CBC: RBC: 3.22 — AB (ref 3.87–5.11)

## 2023-05-16 MED ORDER — DOXYCYCLINE HYCLATE 100 MG PO TABS
100.0000 mg | ORAL_TABLET | Freq: Two times a day (BID) | ORAL | 0 refills | Status: AC
Start: 2023-05-16 — End: 2023-05-26

## 2023-05-16 NOTE — Progress Notes (Unsigned)
[facility] clinic    Provider:  Kenard Gower DNP  Code Status:  DNR  Goals of Care:     04/21/2023    2:34 PM  Advanced Directives  Does Patient Have a Medical Advance Directive? Yes  Type of Estate agent of [place];Out of facility DNR (pink MOST or yellow form)  Does patient want to make changes to medical advance directive? No - Patient declined  Copy of Healthcare Power of Attorney in Chart? Yes - validated most recent copy scanned in chart (See row information)     Chief Complaint  Patient presents with   Acute Visit    URI    HPI: Patient is a 89 y.o. female seen today for an acute visit for URI.     Past Medical History:  Diagnosis Date   Anemia    Anxiety    Chronotropic incompetence 12/2012    potentially medication related; noted on CPET    DDD (degenerative disc disease)    Eczema    GERD (gastroesophageal reflux disease)    H/O hiatal hernia    Hx: UTI (urinary tract infection)    Hyperlipidemia    Hypertension    Hypothyroidism    Osteopenia    Septicemia (HCC) 2002   following UTI   Vertigo, benign positional     Past Surgical History:  Procedure Laterality Date   BREAST SURGERY     left biopsy   CATARACT EXTRACTION Right    2 weeks ago   CPET / MET - PFTS     Consistent with chronotropic incompetence; See attached report in the results section   DILATION AND CURETTAGE OF UTERUS     x 2   DOPPLER ECHOCARDIOGRAPHY  01/10/2013   Normal LV size and function. Normal EF. Air sclerosis but no stenosis   ESOPHAGOGASTRODUODENOSCOPY  05/04/2012   Procedure: ESOPHAGOGASTRODUODENOSCOPY (EGD);  Surgeon: Louis Meckel, MD;  Location: [facility];  Service: Endoscopy;  Laterality: N/A;   EYE SURGERY     cataract extraction with ILO  ? eye   IR KYPHO EA ADDL LEVEL THORACIC OR LUMBAR  10/28/2016   IR KYPHO LUMBAR INC FX REDUCE BONE BX UNI/BIL CANNULATION INC/IMAGING  10/28/2016   IR KYPHO THORACIC WITH BONE BIOPSY  12/24/2016    IR RADIOLOGIST EVAL & MGMT  11/11/2016   KNEE ARTHROSCOPY  2011   right   ORIF ANKLE FRACTURE Right 03/27/2021   Procedure: OPEN REDUCTION INTERNAL FIXATION (ORIF) ANKLE FRACTURE;  Surgeon: Teryl Lucy, MD;  Location: [facility];  Service: Orthopedics;  Laterality: Right;   TONSILLECTOMY     TOTAL KNEE ARTHROPLASTY  04/24/2012   Procedure: TOTAL KNEE ARTHROPLASTY;  Surgeon: Loanne Drilling, MD;  Location: [facility];  Service: Orthopedics;  Laterality: Right;   TRANSTHORACIC ECHOCARDIOGRAM  02/2018   Ordered by PCP for "congestive heart failure " -EF 60 M 65%.  GR 1 DD.  No R WMA--- > ESSENTIALLY NORMAL    No Known Allergies  Outpatient Encounter Medications as of 05/16/2023  Medication Sig   acetaminophen (TYLENOL) 500 MG tablet Take 1,000 mg by mouth 3 (three) times daily. For pain   ALPRAZolam (XANAX) 0.25 MG tablet Take 0.25 mg by mouth every 12 (twelve) hours as needed for anxiety.   buPROPion (WELLBUTRIN SR) 150 MG 12 hr tablet Take 1 tablet (150 mg total) by mouth 2 (two) times daily after a meal.   calcium citrate-vitamin D (CITRACAL+D) 315-200 MG-UNIT tablet Take 1 tablet by mouth  2 (two) times daily.   Camphor-Menthol-Methyl Sal 3.07-31-08 % PTCH Place 1 patch onto the skin See admin instructions. Apply one patch onto the skin of the  lower back daily for back pain   Carboxymethylcellulose Sodium (ARTIFICIAL TEARS OP) Place 1 drop into both eyes in the morning, at noon, and at bedtime.   diclofenac Sodium (VOLTAREN) 1 % GEL Apply 4 g topically in the morning and at bedtime. Apply to bilateral ankles BID   ferrous sulfate 325 (65 FE) MG EC tablet Take 325 mg by mouth. 2/ week   gabapentin (NEURONTIN) 100 MG capsule Take 200 mg by mouth at bedtime.   hydrocortisone cream 1 % Apply 1 Application topically as needed for itching. Apply to skin topically as needed for itching   levothyroxine (SYNTHROID) 75 MCG tablet Take 75 mcg by mouth every morning.   losartan (COZAAR) 25 MG tablet Take 12.5  mg by mouth every morning.   Menthol, Topical Analgesic, (BIOFREEZE) 4 % GEL Apply topically 2 (two) times daily as needed. Apply thin layer to left thigh   nystatin (MYCOSTATIN/NYSTOP) powder Apply 1 application  topically as needed (yeast).   nystatin cream (MYCOSTATIN) Apply 1 Application topically as needed.   omeprazole (PRILOSEC) 40 MG capsule Take 40 mg by mouth every morning.   OxyCODONE HCl, Abuse Deter, (OXAYDO) 5 MG TABA Take 2.5 mg by mouth as needed. Give 30 minutes before transportation to ortho appointments.   OXYGEN Inhale into the lungs. For SOB to keep 02 sats >90%; every shift as needed for SOB   polyethylene glycol (MIRALAX / GLYCOLAX) 17 g packet Take 17 g by mouth as directed. Every 2 days   potassium chloride SA (KLOR-CON M) 20 MEQ tablet Take 20 mEq by mouth as directed. Monday, Tuesday, Wednesday, Thursday, and Friday.   sertraline (ZOLOFT) 25 MG tablet Take 50 mg by mouth every morning.   torsemide (DEMADEX) 20 MG tablet Take 20 mg by mouth as directed. Once A Day on Mon, Tue, Wed, Thu, Fri   zinc oxide 20 % ointment Apply 1 application topically See admin instructions. Apply topically to per/buttocks after every incontinent episode and as needed for redness   No facility-administered encounter medications on file as of 05/16/2023.    Review of Systems:  Review of Systems  Unable to obtain due to being sleepy.  Health Maintenance  Topic Date Due   INFLUENZA VACCINE  02/24/2023   OPHTHALMOLOGY EXAM  03/24/2023   COVID-19 Vaccine (8 - 2023-24 season) 03/27/2023   Medicare Annual Wellness (AWV)  09/23/2023   DTaP/Tdap/Td (3 - Td or Tdap) 05/14/2029   Pneumonia Vaccine 23+ Years old  Completed   DEXA SCAN  Completed   Zoster Vaccines- Shingrix  Completed   HPV VACCINES  Aged Out    Physical Exam: Vitals:   05/16/23 1714  BP: 122/69  Pulse: 67  Resp: 17  Temp: (!) 96.5 F (35.8 C)  SpO2: 93%  Weight: 148 lb 6.4 oz (67.3 kg)   Body mass index is 25.47  kg/m. Physical Exam Constitutional:      General: She is not in acute distress.    Appearance: Normal appearance.  HENT:     Head: Normocephalic and atraumatic.     Nose: Nose normal.     Mouth/Throat:     Mouth: Mucous membranes are moist.  Eyes:     Conjunctiva/sclera: Conjunctivae normal.  Cardiovascular:     Rate and Rhythm: Normal rate and regular rhythm.  Pulmonary:  Effort: Pulmonary effort is normal.     Breath sounds: Normal breath sounds.  Abdominal:     General: Bowel sounds are normal.     Palpations: Abdomen is soft.  Musculoskeletal:        General: Normal range of motion.     Cervical back: Normal range of motion.  Skin:    General: Skin is warm and dry.  Neurological:     Mental Status: She is alert.     Comments: sleepy     Labs reviewed: Basic Metabolic Panel: Recent Labs    05/31/22 0850 10/25/22 0710  NA 139 140  K 3.7 4.0  CL 107 107  CO2 28* 27*  BUN 17 25*  CREATININE 1.2* 1.3*  CALCIUM 9.1 9.4  TSH 3.25  --    Liver Function Tests: Recent Labs    05/31/22 0850 10/25/22 0710  AST 18 13  ALT 16 8  ALKPHOS 92 78  ALBUMIN 3.7 3.8   No results for input(s): "LIPASE", "AMYLASE" in the last 8760 hours. No results for input(s): "AMMONIA" in the last 8760 hours. CBC: Recent Labs    05/31/22 0850 10/25/22 0710  WBC 5.7 4.0  NEUTROABS  --  2,748.00  HGB 7.9* 8.5*  HCT 26* 27*  PLT 96* 78*   Lipid Panel: Recent Labs    10/25/22 0710  CHOL 125  HDL 39  LDLCALC 69  TRIG 89   No results found for: "HGBA1C"  Procedures since last visit: No results found.  Assessment/Plan There are no diagnoses linked to this encounter.   Labs/tests ordered:  * No order type specified * Next appt:  Visit date not found

## 2023-05-24 ENCOUNTER — Non-Acute Institutional Stay (SKILLED_NURSING_FACILITY): Payer: Medicare Other | Admitting: Orthopedic Surgery

## 2023-05-24 ENCOUNTER — Encounter: Payer: Self-pay | Admitting: Orthopedic Surgery

## 2023-05-24 DIAGNOSIS — G8929 Other chronic pain: Secondary | ICD-10-CM

## 2023-05-24 DIAGNOSIS — E039 Hypothyroidism, unspecified: Secondary | ICD-10-CM | POA: Diagnosis not present

## 2023-05-24 DIAGNOSIS — F0392 Unspecified dementia, unspecified severity, with psychotic disturbance: Secondary | ICD-10-CM

## 2023-05-24 DIAGNOSIS — F411 Generalized anxiety disorder: Secondary | ICD-10-CM

## 2023-05-24 DIAGNOSIS — I10 Essential (primary) hypertension: Secondary | ICD-10-CM | POA: Diagnosis not present

## 2023-05-24 DIAGNOSIS — M545 Low back pain, unspecified: Secondary | ICD-10-CM | POA: Diagnosis not present

## 2023-05-24 DIAGNOSIS — E782 Mixed hyperlipidemia: Secondary | ICD-10-CM

## 2023-05-24 DIAGNOSIS — N1831 Chronic kidney disease, stage 3a: Secondary | ICD-10-CM

## 2023-05-24 DIAGNOSIS — I5189 Other ill-defined heart diseases: Secondary | ICD-10-CM | POA: Diagnosis not present

## 2023-05-24 DIAGNOSIS — J069 Acute upper respiratory infection, unspecified: Secondary | ICD-10-CM | POA: Diagnosis not present

## 2023-05-24 DIAGNOSIS — F339 Major depressive disorder, recurrent, unspecified: Secondary | ICD-10-CM

## 2023-05-24 DIAGNOSIS — D61818 Other pancytopenia: Secondary | ICD-10-CM

## 2023-05-24 NOTE — Progress Notes (Signed)
Location:   [facility]  Nursing Home Room Number: 35-A Place of Service:  SNF [phone]) Provider:  Hazle Nordmann, NP  PCP: Mahlon Gammon, MD  Patient Care Team: Mahlon Gammon, MD as PCP - General (Internal Medicine) Mast, Man X, NP as Nurse Practitioner (Internal Medicine)  Extended Emergency Contact Information Primary Emergency Contact: Phenix,[name] Address: [address]          [place], [place] [address] [place] Home Phone: [phone] Mobile Phone: [phone] Relation: Son Secondary Emergency Contact: [place], Cindra [place] Home Phone: [phone] Mobile Phone: [phone] Relation: Other  Code Status:  DNR Goals of care: Advanced Directive information    05/24/2023    8:36 AM  Advanced Directives  Does Patient Have a Medical Advance Directive? Yes  Type of Estate agent of [place];Out of facility DNR (pink MOST or yellow form)  Does patient want to make changes to medical advance directive? No - Patient declined  Copy of Healthcare Power of Attorney in Chart? Yes - validated most recent copy scanned in chart (See row information)     Chief Complaint  Patient presents with   Medical Management of Chronic Issues    Routine Visit.    Immunizations    Discuss the need for Influenza vaccine, and Covid Booster.    Health Maintenance    Discuss the need for eye exam.     HPI:  Pt is a 89 y.o. female seen today for medical management of chronic diseases.    She currently resides on the skilled nursing unit at [facility]. PMH: HTN, HLD, PVD, CKD, esophagitis, GERD, GI bleed, thrombocytopenia, pancytopenia, hypothyroidism, OA, L2 compression fracture, right trimalleolar fracture s/p ORIF 03/2021, chronic back pain, depression and anxiety.   URI-  10/21 CXR negative for infiltrate or effusion, chronic lung changes present, 10/21 started on doxycycline x 10 days, reports improved cough today HTN- BUN/creat  17/1.16 05/16/2023, remains on losartan HLD- LDL 69 10/25/2022, statin discontinued last month per goals of care CKD- see above Diastolic dysfunction- BNP 289 40/04/2724, Echo 03/18 noted normal LV systolic function and mild LV hypertrophy, some sob with exertion, remains in torsemide M-F Hallucinations with dementia- BIMS score 15/15 (09/05)> was 15/15 (06/06), CT head noted chronic microvascular ischemic disease 04/16/2022, intermittent confusion with hallucinations, remains on xanax prn Hypothyroidism- TSH 3.25 05/31/2022, remains on levothyroxine Pancytopenia- no further workup, remains on ferrous sulfate 2x/week Depression/Anxiety- no mood changes or panic attacks, Na+ 134 05/16/2023,remains on Wellbutrin and Zoloft Chronic back pain- h/o compression fracture T11/L1/L2 and scoliosis, remains on tylenol, gabapentin, voltaren gel and oxycodone prn  No recent falls or injuries.   Recent blood pressures:  10/22- 154/76, 118/72  10/15- 122/69  10/08- 121/63  Recent weights:  10/01- 148.4 lbs  09/02- 148.7 lbs  08/01- 149.9 lbs      Past Medical History:  Diagnosis Date   Anemia    Anxiety    Chronotropic incompetence 12/2012    potentially medication related; noted on CPET    DDD (degenerative disc disease)    Eczema    GERD (gastroesophageal reflux disease)    H/O hiatal hernia    Hx: UTI (urinary tract infection)    Hyperlipidemia    Hypertension    Hypothyroidism    Osteopenia    Septicemia (HCC) 2002   following UTI   Vertigo, benign positional    Past Surgical History:  Procedure Laterality Date   BREAST SURGERY  left biopsy   CATARACT EXTRACTION Right    2 weeks ago   CPET / MET - PFTS     Consistent with chronotropic incompetence; See attached report in the results section   DILATION AND CURETTAGE OF UTERUS     x 2   DOPPLER ECHOCARDIOGRAPHY  01/10/2013   Normal LV size and function. Normal EF. Air sclerosis but no stenosis   ESOPHAGOGASTRODUODENOSCOPY   05/04/2012   Procedure: ESOPHAGOGASTRODUODENOSCOPY (EGD);  Surgeon: Louis Meckel, MD;  Location: [facility];  Service: Endoscopy;  Laterality: N/A;   EYE SURGERY     cataract extraction with ILO  ? eye   IR KYPHO EA ADDL LEVEL THORACIC OR LUMBAR  10/28/2016   IR KYPHO LUMBAR INC FX REDUCE BONE BX UNI/BIL CANNULATION INC/IMAGING  10/28/2016   IR KYPHO THORACIC WITH BONE BIOPSY  12/24/2016   IR RADIOLOGIST EVAL & MGMT  11/11/2016   KNEE ARTHROSCOPY  2011   right   ORIF ANKLE FRACTURE Right 03/27/2021   Procedure: OPEN REDUCTION INTERNAL FIXATION (ORIF) ANKLE FRACTURE;  Surgeon: Teryl Lucy, MD;  Location: [facility];  Service: Orthopedics;  Laterality: Right;   TONSILLECTOMY     TOTAL KNEE ARTHROPLASTY  04/24/2012   Procedure: TOTAL KNEE ARTHROPLASTY;  Surgeon: Loanne Drilling, MD;  Location: [facility];  Service: Orthopedics;  Laterality: Right;   TRANSTHORACIC ECHOCARDIOGRAM  02/2018   Ordered by PCP for "congestive heart failure " -EF 60 M 65%.  GR 1 DD.  No R WMA--- > ESSENTIALLY NORMAL    No Known Allergies  Allergies as of 05/24/2023   No Known Allergies      Medication List        Accurate as of May 24, 2023  8:36 AM. If you have any questions, ask your nurse or doctor.          acetaminophen 500 MG tablet Commonly known as: TYLENOL Take 1,000 mg by mouth 3 (three) times daily. For pain   ALPRAZolam 0.25 MG tablet Commonly known as: XANAX Take 0.25 mg by mouth every 12 (twelve) hours as needed for anxiety.   ARTIFICIAL TEARS OP Place 1 drop into both eyes in the morning, at noon, and at bedtime.   Biofreeze 4 % Gel Generic drug: Menthol (Topical Analgesic) Apply topically 2 (two) times daily as needed. Apply thin layer to left thigh   buPROPion 150 MG 12 hr tablet Commonly known as: WELLBUTRIN SR Take 1 tablet (150 mg total) by mouth 2 (two) times daily after a meal.   calcium citrate-vitamin D 315-200 MG-UNIT tablet Commonly known as: CITRACAL+D Take 1 tablet  by mouth 2 (two) times daily.   Camphor-Menthol-Methyl Sal 3.07-31-08 % Ptch Place 1 patch onto the skin See admin instructions. Apply one patch onto the skin of the  lower back daily for back pain   diclofenac Sodium 1 % Gel Commonly known as: VOLTAREN Apply 4 g topically in the morning and at bedtime. Apply to bilateral ankles BID   doxycycline 100 MG tablet Commonly known as: VIBRA-TABS Take 1 tablet (100 mg total) by mouth 2 (two) times daily for 10 days.   ferrous sulfate 325 (65 FE) MG EC tablet Take 325 mg by mouth 2 (two) times a week. Monday & Friday   gabapentin 100 MG capsule Commonly known as: NEURONTIN Take 200 mg by mouth at bedtime.   hydrocortisone cream 1 % Apply 1 Application topically as needed for itching. Apply to skin topically as needed for itching  levothyroxine 75 MCG tablet Commonly known as: SYNTHROID Take 75 mcg by mouth every morning.   losartan 25 MG tablet Commonly known as: COZAAR Take 12.5 mg by mouth every morning.   nystatin powder Commonly known as: MYCOSTATIN/NYSTOP Apply 1 application  topically as needed (yeast).   nystatin cream Commonly known as: MYCOSTATIN Apply 1 Application topically as needed.   omeprazole 40 MG capsule Commonly known as: PRILOSEC Take 40 mg by mouth every morning.   OxyCODONE HCl (Abuse Deter) 5 MG Taba Commonly known as: OXAYDO Take 2.5 mg by mouth as needed. Give 30 minutes before transportation to ortho appointments.   OXYGEN Inhale into the lungs. For SOB to keep 02 sats >90%; every shift as needed for SOB   polyethylene glycol 17 g packet Commonly known as: MIRALAX / GLYCOLAX Take 17 g by mouth as directed. Every 2 days   potassium chloride SA 20 MEQ tablet Commonly known as: KLOR-CON M Take 20 mEq by mouth as directed. Monday, Tuesday, Wednesday, Thursday, and Friday.   Robitussin Nighttime Cough 1-7.5 MG/5ML Liqd Generic drug: Chlorpheniramine-DM Take 5 mLs by mouth every 6 (six) hours as  needed.   sertraline 25 MG tablet Commonly known as: ZOLOFT Take 50 mg by mouth every morning.   torsemide 20 MG tablet Commonly known as: DEMADEX Take 20 mg by mouth as directed. Once A Day on Mon, Tue, Wed, Thu, Fri   zinc oxide 20 % ointment Apply 1 application topically See admin instructions. Apply topically to per/buttocks after every incontinent episode and as needed for redness        Review of Systems  Constitutional:  Negative for activity change, appetite change, fatigue and fever.  HENT:  Negative for congestion, sinus pressure, sinus pain, sore throat and trouble swallowing.   Eyes:  Negative for visual disturbance.  Respiratory:  Positive for cough. Negative for shortness of breath and wheezing.   Cardiovascular:  Negative for chest pain and leg swelling.  Gastrointestinal:  Negative for abdominal distention and abdominal pain.  Genitourinary:  Negative for dysuria and frequency.  Musculoskeletal:  Positive for arthralgias, back pain and gait problem.  Skin:  Negative for wound.  Neurological:  Positive for weakness. Negative for dizziness and headaches.  Psychiatric/Behavioral:  Positive for confusion and dysphoric mood. Negative for sleep disturbance. The patient is nervous/anxious.     Immunization History  Administered Date(s) Administered   Fluad Quad(high Dose 65+) 05/19/2022   Influenza, High Dose Seasonal PF 04/26/2016, 03/22/2017, 05/08/2019   Influenza,inj,Quad PF,6+ Mos 04/27/2018   Influenza-Unspecified 04/22/2011, 04/04/2012, 05/03/2013, 05/04/2013, 04/17/2014, 04/19/2015, 04/26/2016, 04/22/2017, 05/20/2021   Moderna SARS-COV2 Booster Vaccination 06/09/2020, 12/31/2020   Moderna Sars-Covid-2 Vaccination 07/30/2019, 08/27/2019, 01/06/2022   PFIZER(Purple Top)SARS-COV-2 Vaccination 06/01/2022   Pfizer Covid-19 Vaccine Bivalent Booster 6yrs & up 04/15/2021   Pneumococcal Conjugate-13 09/06/2011   Pneumococcal Polysaccharide-23 08/26/2003, 09/06/2011,  05/15/2019   Pneumococcal-Unspecified 12/20/2013   Td 09/26/2006   Tdap 05/15/2019   Zoster Recombinant(Shingrix) 11/21/2017, 04/08/2018   Zoster, Live 10/25/2007   Pertinent  Health Maintenance Due  Topic Date Due   INFLUENZA VACCINE  02/24/2023   OPHTHALMOLOGY EXAM  03/24/2023   DEXA SCAN  Completed      10/06/2022   11:22 AM 10/11/2022   10:15 AM 10/20/2022   10:35 AM 10/26/2022    9:26 AM 01/20/2023    3:50 PM  Fall Risk  Falls in the past year? 1 1 1  0 0  Was there an injury with Fall? 0 0 0  0 0  Fall Risk Category Calculator 2 2 1  0 0  Patient at Risk for Falls Due to History of fall(s);Impaired balance/gait;Impaired mobility;Mental status change History of fall(s);Impaired balance/gait;Impaired mobility History of fall(s);Impaired balance/gait;Impaired mobility History of fall(s);Impaired balance/gait;Impaired mobility   Fall risk Follow up Falls evaluation completed;Education provided;Falls prevention discussed Falls evaluation completed;Education provided;Falls prevention discussed Falls evaluation completed;Education provided;Falls prevention discussed Falls evaluation completed    Functional Status Survey:    Vitals:   05/24/23 0830  BP: (!) 154/76  Pulse: 79  Resp: 20  Temp: (!) 97.5 F (36.4 C)  SpO2: 91%  Weight: 148 lb 6.4 oz (67.3 kg)  Height: 5\' 4"  (1.626 m)   Body mass index is 25.47 kg/m. Physical Exam Vitals reviewed.  Constitutional:      General: She is not in acute distress. HENT:     Head: Normocephalic.     Right Ear: There is no impacted cerumen.     Left Ear: There is no impacted cerumen.     Nose: Nose normal.     Mouth/Throat:     Mouth: Mucous membranes are moist.  Eyes:     General:        Right eye: No discharge.        Left eye: No discharge.  Cardiovascular:     Rate and Rhythm: Normal rate and regular rhythm.     Pulses: Normal pulses.     Heart sounds: Normal heart sounds.  Pulmonary:     Effort: Pulmonary effort is normal.  No respiratory distress.     Breath sounds: Normal breath sounds. No wheezing, rhonchi or rales.  Abdominal:     General: Bowel sounds are normal.     Palpations: Abdomen is soft.  Musculoskeletal:     Cervical back: Neck supple.     Right lower leg: No edema.     Left lower leg: No edema.  Skin:    General: Skin is warm.     Capillary Refill: Capillary refill takes less than 2 seconds.  Neurological:     General: No focal deficit present.     Mental Status: She is alert and oriented to person, place, and time.     Motor: Weakness present.     Gait: Gait abnormal.  Psychiatric:        Mood and Affect: Mood normal.     Labs reviewed: Recent Labs    05/31/22 0850 10/25/22 0710  NA 139 140  K 3.7 4.0  CL 107 107  CO2 28* 27*  BUN 17 25*  CREATININE 1.2* 1.3*  CALCIUM 9.1 9.4   Recent Labs    05/31/22 0850 10/25/22 0710  AST 18 13  ALT 16 8  ALKPHOS 92 78  ALBUMIN 3.7 3.8   Recent Labs    05/31/22 0850 10/25/22 0710  WBC 5.7 4.0  NEUTROABS  --  2,748.00  HGB 7.9* 8.5*  HCT 26* 27*  PLT 96* 78*   Lab Results  Component Value Date   TSH 3.25 05/31/2022   No results found for: "HGBA1C" Lab Results  Component Value Date   CHOL 125 10/25/2022   HDL 39 10/25/2022   LDLCALC 69 10/25/2022   TRIG 89 10/25/2022   CHOLHDL 3.8 11/23/2018    Significant Diagnostic Results in last 30 days:  No results found.  Assessment/Plan 1. Upper respiratory tract infection, unspecified type - noted 10/18 - 10/21 CXR negative for infiltrate and effusion - improved symptoms - cont doxycycline> 10  day course  2. Essential hypertension - controlled - cont losartan  3. Mixed hyperlipidemia - LDL stable - off statin  4. Stage 3a chronic kidney disease (HCC) - encourage hydration with water - avoid NSAIDS  5. Diastolic dysfunction - compensated - cont torsemide M/W/F  6. 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 Acquired hypothyroidism - TSH stable - cont levothyroxine  8. Pancytopenia (HCC) - no further workup per goals of care - hgb 8.9 05/16/2023  9. Depression, recurrent (HCC) - no mood changes - cont Wellbutrin  10. Generalized anxiety disorder - stable with xanax  11. Chronic bilateral low back pain without sciatica - ongoing - cont tylenol, gabapentin, voltaren gel and oxycodone prn     Family/ staff Communication: plan discussed with patient and nurse  Labs/tests ordered: none

## 2023-06-15 ENCOUNTER — Encounter: Payer: Self-pay | Admitting: Orthopedic Surgery

## 2023-06-15 ENCOUNTER — Non-Acute Institutional Stay (SKILLED_NURSING_FACILITY): Payer: Medicare Other | Admitting: Orthopedic Surgery

## 2023-06-15 DIAGNOSIS — F419 Anxiety disorder, unspecified: Secondary | ICD-10-CM

## 2023-06-15 DIAGNOSIS — D61818 Other pancytopenia: Secondary | ICD-10-CM | POA: Diagnosis not present

## 2023-06-15 DIAGNOSIS — E782 Mixed hyperlipidemia: Secondary | ICD-10-CM | POA: Diagnosis not present

## 2023-06-15 DIAGNOSIS — N1831 Chronic kidney disease, stage 3a: Secondary | ICD-10-CM

## 2023-06-15 DIAGNOSIS — F339 Major depressive disorder, recurrent, unspecified: Secondary | ICD-10-CM

## 2023-06-15 DIAGNOSIS — I5189 Other ill-defined heart diseases: Secondary | ICD-10-CM

## 2023-06-15 DIAGNOSIS — G629 Polyneuropathy, unspecified: Secondary | ICD-10-CM

## 2023-06-15 DIAGNOSIS — F0392 Unspecified dementia, unspecified severity, with psychotic disturbance: Secondary | ICD-10-CM

## 2023-06-15 DIAGNOSIS — I10 Essential (primary) hypertension: Secondary | ICD-10-CM | POA: Diagnosis not present

## 2023-06-15 DIAGNOSIS — M545 Low back pain, unspecified: Secondary | ICD-10-CM

## 2023-06-15 DIAGNOSIS — G8929 Other chronic pain: Secondary | ICD-10-CM

## 2023-06-15 DIAGNOSIS — E039 Hypothyroidism, unspecified: Secondary | ICD-10-CM

## 2023-06-15 MED ORDER — GABAPENTIN 300 MG PO CAPS
300.0000 mg | ORAL_CAPSULE | Freq: Every day | ORAL | Status: AC
Start: 2023-06-15 — End: ?

## 2023-06-15 NOTE — Progress Notes (Signed)
Location:  [facility] Room Number: 35/A Place of Service:  SNF [phone]) Provider:  Octavia Heir, NP   Mahlon Gammon, MD  Patient Care Team: Mahlon Gammon, MD as PCP - General (Internal Medicine) Mast, Man X, NP as Nurse Practitioner (Internal Medicine)  Extended Emergency Contact Information Primary Emergency Contact: Nihiser,[name] Address: [address]          [place], [place] [address] [place] Home Phone: [phone] Mobile Phone: [phone] Relation: Son Secondary Emergency Contact: Koerber, Cindra [place] Home Phone: [phone] Mobile Phone: [phone] Relation: Other  Code Status:  DNR Goals of care: Advanced Directive information    05/24/2023    8:36 AM  Advanced Directives  Does Patient Have a Medical Advance Directive? Yes  Type of Estate agent of [place];Out of facility DNR (pink MOST or yellow form)  Does patient want to make changes to medical advance directive? No - Patient declined  Copy of Healthcare Power of Attorney in Chart? Yes - validated most recent copy scanned in chart (See row information)     Chief Complaint  Patient presents with   Medical Management of Chronic Issues    HPI:  Pt is a 89 y.o. female seen today for medical management of chronic diseases.    She currently resides on the skilled nursing unit at [facility]. PMH: HTN, HLD, PVD, CKD, esophagitis, GERD, GI bleed, thrombocytopenia, pancytopenia, hypothyroidism, OA, L2 compression fracture, right trimalleolar fracture s/p ORIF 03/2021, chronic back pain, depression and anxiety.   Neuropathy- increased leg pain at night per patient, described as intermittent burning,  h/o chronic back pain without sciatica> denies increased back pain  HTN- BUN/creat 17/1.16 05/16/2023, remains on losartan HLD- LDL 69 10/25/2022, statin discontinued last month per goals of care CKD- see above Diastolic dysfunction- BNP 289  10/08/2021, Echo 03/18 noted normal LV systolic function and mild LV hypertrophy, some sob with exertion, remains in torsemide M-F Hallucinations with dementia- BIMS score 15/15 (09/05)> was 15/15 (06/06), CT head noted chronic microvascular ischemic disease 04/16/2022, intermittent confusion with hallucinations, remains on xanax prn Hypothyroidism- TSH 3.25 05/31/2022, remains on levothyroxine Pancytopenia- no further workup, remains on ferrous sulfate 2x/week Depression/Anxiety- no mood changes or panic attacks, very supportive family> visit often, Na+ 134 05/16/2023,remains on Wellbutrin and Zoloft Chronic back pain- h/o compression fracture T11/L1/L2 and scoliosis, remains on tylenol, gabapentin, voltaren gel and oxycodone prn  Flu/covid vaccines given within past 3 weeks> tolerated well.   No recent injuries or falls.   Recent blood pressures:  11/19- 143/58  11/12- 120/52  11/05- 137/81  Recent weights:  11/07- 141.5 lbs  10/01- 148.4 lbs  09/02- 148.7 lbs   Past Medical History:  Diagnosis Date   Anemia    Anxiety    Chronotropic incompetence 12/2012    potentially medication related; noted on CPET    DDD (degenerative disc disease)    Eczema    GERD (gastroesophageal reflux disease)    H/O hiatal hernia    Hx: UTI (urinary tract infection)    Hyperlipidemia    Hypertension    Hypothyroidism    Osteopenia    Septicemia (HCC) 2002   following UTI   Vertigo, benign positional    Past Surgical History:  Procedure Laterality Date   BREAST SURGERY     left biopsy   CATARACT EXTRACTION Right    2 weeks ago   CPET / MET - PFTS  Consistent with chronotropic incompetence; See attached report in the results section   DILATION AND CURETTAGE OF UTERUS     x 2   DOPPLER ECHOCARDIOGRAPHY  01/10/2013   Normal LV size and function. Normal EF. Air sclerosis but no stenosis   ESOPHAGOGASTRODUODENOSCOPY  05/04/2012   Procedure: ESOPHAGOGASTRODUODENOSCOPY (EGD);  Surgeon:  Louis Meckel, MD;  Location: [facility];  Service: Endoscopy;  Laterality: N/A;   EYE SURGERY     cataract extraction with ILO  ? eye   IR KYPHO EA ADDL LEVEL THORACIC OR LUMBAR  10/28/2016   IR KYPHO LUMBAR INC FX REDUCE BONE BX UNI/BIL CANNULATION INC/IMAGING  10/28/2016   IR KYPHO THORACIC WITH BONE BIOPSY  12/24/2016   IR RADIOLOGIST EVAL & MGMT  11/11/2016   KNEE ARTHROSCOPY  2011   right   ORIF ANKLE FRACTURE Right 03/27/2021   Procedure: OPEN REDUCTION INTERNAL FIXATION (ORIF) ANKLE FRACTURE;  Surgeon: Teryl Lucy, MD;  Location: [facility];  Service: Orthopedics;  Laterality: Right;   TONSILLECTOMY     TOTAL KNEE ARTHROPLASTY  04/24/2012   Procedure: TOTAL KNEE ARTHROPLASTY;  Surgeon: Loanne Drilling, MD;  Location: [facility];  Service: Orthopedics;  Laterality: Right;   TRANSTHORACIC ECHOCARDIOGRAM  02/2018   Ordered by PCP for "congestive heart failure " -EF 60 M 65%.  GR 1 DD.  No R WMA--- > ESSENTIALLY NORMAL    No Known Allergies  Outpatient Encounter Medications as of 06/15/2023  Medication Sig   acetaminophen (TYLENOL) 500 MG tablet Take 1,000 mg by mouth 3 (three) times daily. For pain   ALPRAZolam (XANAX) 0.25 MG tablet Take 0.25 mg by mouth every 12 (twelve) hours as needed for anxiety.   buPROPion (WELLBUTRIN SR) 150 MG 12 hr tablet Take 1 tablet (150 mg total) by mouth 2 (two) times daily after a meal.   calcium citrate-vitamin D (CITRACAL+D) 315-200 MG-UNIT tablet Take 1 tablet by mouth 2 (two) times daily.   Camphor-Menthol-Methyl Sal 3.07-31-08 % PTCH Place 1 patch onto the skin See admin instructions. Apply one patch onto the skin of the  lower back daily for back pain   Carboxymethylcellulose Sodium (ARTIFICIAL TEARS OP) Place 1 drop into both eyes in the morning, at noon, and at bedtime.   Chlorpheniramine-DM (ROBITUSSIN NIGHTTIME COUGH) 1-7.5 MG/5ML LIQD Take 5 mLs by mouth every 6 (six) hours as needed.   diclofenac Sodium (VOLTAREN) 1 % GEL Apply 4 g topically in the  morning and at bedtime. Apply to bilateral ankles BID   ferrous sulfate 325 (65 FE) MG EC tablet Take 325 mg by mouth 2 (two) times a week. Monday & Friday   gabapentin (NEURONTIN) 100 MG capsule Take 200 mg by mouth at bedtime.   hydrocortisone cream 1 % Apply 1 Application topically as needed for itching. Apply to skin topically as needed for itching   levothyroxine (SYNTHROID) 75 MCG tablet Take 75 mcg by mouth every morning.   losartan (COZAAR) 25 MG tablet Take 12.5 mg by mouth every morning.   Menthol, Topical Analgesic, (BIOFREEZE) 4 % GEL Apply topically 2 (two) times daily as needed. Apply thin layer to left thigh   nystatin (MYCOSTATIN/NYSTOP) powder Apply 1 application  topically as needed (yeast).   nystatin cream (MYCOSTATIN) Apply 1 Application topically as needed.   omeprazole (PRILOSEC) 40 MG capsule Take 40 mg by mouth every morning.   OxyCODONE HCl, Abuse Deter, (OXAYDO) 5 MG TABA Take 2.5 mg by mouth as needed. Give 30 minutes before  transportation to ortho appointments.   OXYGEN Inhale into the lungs. For SOB to keep 02 sats >90%; every shift as needed for SOB   polyethylene glycol (MIRALAX / GLYCOLAX) 17 g packet Take 17 g by mouth as directed. Every 2 days   potassium chloride SA (KLOR-CON M) 20 MEQ tablet Take 20 mEq by mouth as directed. Monday, Tuesday, Wednesday, Thursday, and Friday.   sertraline (ZOLOFT) 25 MG tablet Take 50 mg by mouth every morning.   torsemide (DEMADEX) 20 MG tablet Take 20 mg by mouth as directed. Once A Day on Mon, Tue, Wed, Thu, Fri   zinc oxide 20 % ointment Apply 1 application topically See admin instructions. Apply topically to per/buttocks after every incontinent episode and as needed for redness   No facility-administered encounter medications on file as of 06/15/2023.    Review of Systems  Constitutional:  Negative for activity change and appetite change.  HENT:  Negative for congestion and trouble swallowing.   Respiratory:  Negative  for cough, shortness of breath and wheezing.   Cardiovascular:  Negative for chest pain and leg swelling.  Gastrointestinal:  Negative for abdominal distention and abdominal pain.  Genitourinary:  Negative for dysuria, frequency and hematuria.  Musculoskeletal:  Positive for arthralgias, back pain and gait problem.  Skin:  Negative for wound.  Neurological:  Positive for weakness and numbness. Negative for dizziness and headaches.  Psychiatric/Behavioral:  Positive for confusion, dysphoric mood and hallucinations. Negative for agitation and sleep disturbance. The patient is nervous/anxious.     Immunization History  Administered Date(s) Administered   Fluad Quad(high Dose 65+) 05/19/2022   Influenza, High Dose Seasonal PF 04/26/2016, 03/22/2017, 05/08/2019   Influenza,inj,Quad PF,6+ Mos 04/27/2018   Influenza-Unspecified 04/22/2011, 04/04/2012, 05/03/2013, 05/04/2013, 04/17/2014, 04/19/2015, 04/26/2016, 04/22/2017, 05/20/2021   Moderna SARS-COV2 Booster Vaccination 06/09/2020, 12/31/2020   Moderna Sars-Covid-2 Vaccination 07/30/2019, 08/27/2019, 01/06/2022   PFIZER(Purple Top)SARS-COV-2 Vaccination 06/01/2022   Pfizer Covid-19 Vaccine Bivalent Booster 55yrs & up 04/15/2021   Pneumococcal Conjugate-13 09/06/2011   Pneumococcal Polysaccharide-23 08/26/2003, 09/06/2011, 05/15/2019   Pneumococcal-Unspecified 12/20/2013   Td 09/26/2006   Tdap 05/15/2019   Zoster Recombinant(Shingrix) 11/21/2017, 04/08/2018   Zoster, Live 10/25/2007   Pertinent  Health Maintenance Due  Topic Date Due   INFLUENZA VACCINE  02/24/2023   OPHTHALMOLOGY EXAM  03/24/2023   DEXA SCAN  Completed      10/06/2022   11:22 AM 10/11/2022   10:15 AM 10/20/2022   10:35 AM 10/26/2022    9:26 AM 01/20/2023    3:50 PM  Fall Risk  Falls in the past year? 1 1 1  0 0  Was there an injury with Fall? 0 0 0 0 0  Fall Risk Category Calculator 2 2 1  0 0  Patient at Risk for Falls Due to History of fall(s);Impaired  balance/gait;Impaired mobility;Mental status change History of fall(s);Impaired balance/gait;Impaired mobility History of fall(s);Impaired balance/gait;Impaired mobility History of fall(s);Impaired balance/gait;Impaired mobility   Fall risk Follow up Falls evaluation completed;Education provided;Falls prevention discussed Falls evaluation completed;Education provided;Falls prevention discussed Falls evaluation completed;Education provided;Falls prevention discussed Falls evaluation completed    Functional Status Survey:    Vitals:   06/15/23 1534  BP: (!) 143/58  Pulse: (!) 54  Resp: 15  Temp: (!) 97.4 F (36.3 C)  SpO2: 94%  Weight: 141 lb 8 oz (64.2 kg)  Height: 5\' 4"  (1.626 m)   Body mass index is 24.29 kg/m. Physical Exam Vitals reviewed.  Constitutional:      General: She is not  in acute distress. HENT:     Head: Normocephalic.     Right Ear: There is no impacted cerumen.     Left Ear: There is no impacted cerumen.     Nose: Nose normal.     Mouth/Throat:     Mouth: Mucous membranes are moist.  Eyes:     General:        Right eye: No discharge.        Left eye: No discharge.  Cardiovascular:     Rate and Rhythm: Normal rate and regular rhythm.     Pulses: Normal pulses.     Heart sounds: Normal heart sounds.  Pulmonary:     Effort: Pulmonary effort is normal. No respiratory distress.     Breath sounds: Normal breath sounds. No wheezing.  Abdominal:     General: Bowel sounds are normal.     Palpations: Abdomen is soft.  Musculoskeletal:     Cervical back: Neck supple.     Right lower leg: No edema.     Left lower leg: No edema.  Skin:    General: Skin is warm.     Capillary Refill: Capillary refill takes less than 2 seconds.  Neurological:     General: No focal deficit present.     Mental Status: She is alert. Mental status is at baseline.     Motor: Weakness present.     Gait: Gait abnormal.     Comments: wheelchair  Psychiatric:        Mood and Affect:  Mood normal.     Labs reviewed: Recent Labs    10/25/22 0710  NA 140  K 4.0  CL 107  CO2 27*  BUN 25*  CREATININE 1.3*  CALCIUM 9.4   Recent Labs    10/25/22 0710  AST 13  ALT 8  ALKPHOS 78  ALBUMIN 3.8   Recent Labs    10/25/22 0710  WBC 4.0  NEUTROABS 2,748.00  HGB 8.5*  HCT 27*  PLT 78*   Lab Results  Component Value Date   TSH 3.25 05/31/2022   No results found for: "HGBA1C" Lab Results  Component Value Date   CHOL 125 10/25/2022   HDL 39 10/25/2022   LDLCALC 69 10/25/2022   TRIG 89 10/25/2022   CHOLHDL 3.8 11/23/2018    Significant Diagnostic Results in last 30 days:  No results found.  Assessment/Plan 1. Neuropathy - involves lower legs, more at bedtime, described as burning - h/o chronic back pain without sciatica - suspect neuropathy  - will increase gabapentin from 200 mg to 300 mg Qhs - gabapentin (NEURONTIN) 300 MG capsule; Take 1 capsule (300 mg total) by mouth at bedtime.  2. Essential hypertension - controlled - cont losartan  3. Mixed hyperlipidemia - off statin  4. Stage 3a chronic kidney disease (HCC) - encourage hydration with water - avoid NSAIDS  5. Diastolic dysfunction - compensated - cont torsemide M/W/F  6. 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 Acquired hypothyroidism - TSH stable - cont levothyroxine - TSH- future  8. Pancytopenia (HCC) - no further workup per goals of care - hgb stable - cont ferrous sulfate  9. Depression, recurrent (HCC) - no mood changes - very supportive family - cont Wellbutrin  10. Anxiety - no recent panic attacks - cont xanax prn  11. Chronic bilateral low back pain without sciatica - see above> leg pain at bedtime -  cont tylenol, gabapentin, voltaren gel and oxycodone prn     Family/ staff Communication: plan discussed with patient and nurse  Labs/tests ordered:  TSH

## 2023-06-16 DIAGNOSIS — E039 Hypothyroidism, unspecified: Secondary | ICD-10-CM | POA: Diagnosis not present

## 2023-06-16 LAB — TSH: TSH: 2.53 (ref 0.41–5.90)

## 2023-07-01 ENCOUNTER — Other Ambulatory Visit: Payer: Self-pay | Admitting: Orthopedic Surgery

## 2023-07-01 DIAGNOSIS — M545 Low back pain, unspecified: Secondary | ICD-10-CM

## 2023-07-01 MED ORDER — OXYCODONE HCL 5 MG PO TABS
2.5000 mg | ORAL_TABLET | Freq: Two times a day (BID) | ORAL | 0 refills | Status: DC | PRN
Start: 2023-07-01 — End: 2023-08-04

## 2023-07-05 ENCOUNTER — Non-Acute Institutional Stay (SKILLED_NURSING_FACILITY): Payer: Medicare Other | Admitting: Adult Health

## 2023-07-05 ENCOUNTER — Encounter: Payer: Self-pay | Admitting: Adult Health

## 2023-07-05 DIAGNOSIS — I10 Essential (primary) hypertension: Secondary | ICD-10-CM

## 2023-07-05 DIAGNOSIS — F411 Generalized anxiety disorder: Secondary | ICD-10-CM | POA: Diagnosis not present

## 2023-07-05 MED ORDER — ALPRAZOLAM 0.25 MG PO TABS
0.2500 mg | ORAL_TABLET | Freq: Two times a day (BID) | ORAL | 0 refills | Status: DC | PRN
Start: 2023-07-05 — End: 2023-07-22

## 2023-07-05 NOTE — Progress Notes (Signed)
Location:  [facility] Room Number: N35-A Place of Service:  SNF (31) Provider:  Kenard Gower, DNP, FNP-BC  Patient Care Team: Mahlon Gammon, MD as PCP - General (Internal Medicine) Mast, Man X, NP as Nurse Practitioner (Internal Medicine)  Extended Emergency Contact Information Primary Emergency Contact: Hutmacher,[name] Address: [address]          [place], [place] [address] [place] Home Phone: [phone] Mobile Phone: [phone] Relation: Son Secondary Emergency Contact: Bobby, Cindra [place] Home Phone: [phone] Mobile Phone: [phone] Relation: Other  Code Status:  DNR  Goals of care: Advanced Directive information    07/05/2023    1:42 PM  Advanced Directives  Does Patient Have a Medical Advance Directive? Yes  Type of Estate agent of [place];Out of facility DNR (pink MOST or yellow form)  Does patient want to make changes to medical advance directive? No - Patient declined  Copy of Healthcare Power of Attorney in Chart? Yes - validated most recent copy scanned in chart (See row information)  Pre-existing out of facility DNR order (yellow form or pink MOST form) Yellow form placed in chart (order not valid for inpatient use)     Chief Complaint  Patient presents with   Acute Visit    Anxiety    HPI:  Pt is a 89 y.o. [name] seen today for an acute visit for anxiety. She is a resident of [id number]. She was reported to cry out loudly and yell. She takes Xanax PRN for anxiety but was now discontinued.                    BP 128/62, takes Losartan for hypertension.  Past Medical History:  Diagnosis Date   Anemia    Anxiety    Chronotropic incompetence 12/2012    potentially medication related; noted on CPET    DDD (degenerative disc disease)    Eczema    GERD (gastroesophageal reflux disease)    H/O hiatal hernia    Hx: UTI (urinary tract infection)     Hyperlipidemia    Hypertension    Hypothyroidism    Osteopenia    Septicemia (HCC) 2002   following UTI   Vertigo, benign positional    Past Surgical History:  Procedure Laterality Date   BREAST SURGERY     left biopsy   CATARACT EXTRACTION Right    2 weeks ago   CPET / MET - PFTS     Consistent with chronotropic incompetence; See attached report in the results section   DILATION AND CURETTAGE OF UTERUS     x 2   DOPPLER ECHOCARDIOGRAPHY  01/10/2013   Normal LV size and function. Normal EF. Air sclerosis but no stenosis   ESOPHAGOGASTRODUODENOSCOPY  05/04/2012   Procedure: ESOPHAGOGASTRODUODENOSCOPY (EGD);  Surgeon: Louis Meckel, MD;  Location: [facility];  Service: Endoscopy;  Laterality: N/A;   EYE SURGERY     cataract extraction with ILO  ? eye   IR KYPHO EA ADDL LEVEL THORACIC OR LUMBAR  10/28/2016   IR KYPHO LUMBAR INC FX REDUCE BONE BX UNI/BIL CANNULATION INC/IMAGING  10/28/2016   IR KYPHO THORACIC WITH BONE BIOPSY  12/24/2016   IR RADIOLOGIST EVAL & MGMT  11/11/2016   KNEE ARTHROSCOPY  2011   right   ORIF ANKLE FRACTURE Right 03/27/2021   Procedure: OPEN REDUCTION INTERNAL FIXATION (ORIF) ANKLE FRACTURE;  Surgeon: Teryl Lucy, MD;  Location:  [facility];  Service: Orthopedics;  Laterality: Right;   TONSILLECTOMY     TOTAL KNEE ARTHROPLASTY  04/24/2012   Procedure: TOTAL KNEE ARTHROPLASTY;  Surgeon: Loanne Drilling, MD;  Location: [facility];  Service: Orthopedics;  Laterality: Right;   TRANSTHORACIC ECHOCARDIOGRAM  02/2018   Ordered by PCP for "congestive heart failure " -EF 60 M 65%.  GR 1 DD.  No R WMA--- > ESSENTIALLY NORMAL    No Known Allergies  Outpatient Encounter Medications as of 07/05/2023  Medication Sig   acetaminophen (TYLENOL) 500 MG tablet Take 1,000 mg by mouth 3 (three) times daily. For pain   ALPRAZolam (XANAX) 0.25 MG tablet Take 1 tablet (0.25 mg total) by mouth 2 (two) times daily as needed for anxiety.   buPROPion (WELLBUTRIN SR) 150 MG 12 hr tablet Take  1 tablet (150 mg total) by mouth 2 (two) times daily after a meal.   calcium citrate-vitamin D (CITRACAL+D) 315-200 MG-UNIT tablet Take 1 tablet by mouth 2 (two) times daily.   Camphor-Menthol-Methyl Sal 3.07-31-08 % PTCH Place 1 patch onto the skin See admin instructions. Apply one patch onto the skin of the  lower back daily for back pain   Carboxymethylcellulose Sodium (ARTIFICIAL TEARS OP) Place 1 drop into both eyes in the morning, at noon, and at bedtime.   Chlorpheniramine-DM (ROBITUSSIN NIGHTTIME COUGH) 1-7.5 MG/5ML LIQD Take 5 mLs by mouth every 6 (six) hours as needed.   diclofenac Sodium (VOLTAREN) 1 % GEL Apply 4 g topically in the morning and at bedtime. Apply to bilateral ankles BID   ferrous sulfate 325 (65 FE) MG EC tablet Take 325 mg by mouth 2 (two) times a week. Monday & Friday   gabapentin (NEURONTIN) 300 MG capsule Take 1 capsule (300 mg total) by mouth at bedtime.   hydrocortisone cream 1 % Apply 1 Application topically as needed for itching. Apply to skin topically as needed for itching   levothyroxine (SYNTHROID) 75 MCG tablet Take 75 mcg by mouth every morning.   losartan (COZAAR) 25 MG tablet Take 12.5 mg by mouth every morning.   Menthol, Topical Analgesic, (BIOFREEZE) 4 % GEL Apply topically 2 (two) times daily as needed. Apply thin layer to left thigh   nystatin (MYCOSTATIN/NYSTOP) powder Apply 1 application  topically as needed (yeast).   nystatin cream (MYCOSTATIN) Apply 1 Application topically as needed.   omeprazole (PRILOSEC) 40 MG capsule Take 40 mg by mouth every morning.   oxyCODONE (OXY IR/ROXICODONE) 5 MG immediate release tablet Take 0.5 tablets (2.5 mg total) by mouth 2 (two) times daily as needed for severe pain (pain score 7-10).   OXYGEN Inhale into the lungs. For SOB to keep 02 sats >90%; every shift as needed for SOB   polyethylene glycol (MIRALAX / GLYCOLAX) 17 g packet Take 17 g by mouth as directed. Every 2 days   potassium chloride SA (KLOR-CON M) 20  MEQ tablet Take 20 mEq by mouth as directed. Monday, Tuesday, Wednesday, Thursday, and Friday.   sertraline (ZOLOFT) 25 MG tablet Take 50 mg by mouth every morning.   torsemide (DEMADEX) 20 MG tablet Take 20 mg by mouth as directed. Once A Day on Mon, Tue, Wed, Thu, Fri   zinc oxide 20 % ointment Apply 1 application topically See admin instructions. Apply topically to per/buttocks after every incontinent episode and as needed for redness   [DISCONTINUED] ALPRAZolam (XANAX) 0.25 MG tablet Take 0.25 mg by mouth every 12 (twelve) hours as needed for anxiety.  No facility-administered encounter medications on file as of 07/05/2023.    Review of Systems  Constitutional:  Negative for appetite change, chills, fatigue and fever.  HENT:  Negative for congestion, hearing loss, rhinorrhea and sore throat.   Eyes: Negative.   Respiratory:  Negative for cough, shortness of breath and wheezing.   Cardiovascular:  Negative for chest pain, palpitations and leg swelling.  Gastrointestinal:  Negative for abdominal pain, constipation, diarrhea, nausea and vomiting.  Genitourinary:  Negative for dysuria.  Musculoskeletal:  Negative for arthralgias, back pain and myalgias.  Skin:  Negative for color change, rash and wound.  Neurological:  Negative for dizziness, weakness and headaches.  Psychiatric/Behavioral:  Negative for behavioral problems. The patient is nervous/anxious.        Immunization History  Administered Date(s) Administered   Fluad Quad(high Dose 65+) 05/19/2022, 05/25/2023   Influenza, High Dose Seasonal PF 04/26/2016, 03/22/2017, 05/08/2019   Influenza,inj,Quad PF,6+ Mos 04/27/2018   Influenza-Unspecified 04/22/2011, 04/04/2012, 05/03/2013, 05/04/2013, 04/17/2014, 04/19/2015, 04/26/2016, 04/22/2017, 05/20/2021   Moderna Covid-19 Vaccine Bivalent Booster 33yrs & up 06/01/2023   Moderna SARS-COV2 Booster Vaccination 06/09/2020, 12/31/2020   Moderna Sars-Covid-2 Vaccination 07/30/2019,  08/27/2019, 01/06/2022   PFIZER(Purple Top)SARS-COV-2 Vaccination 06/01/2022   Pfizer Covid-19 Vaccine Bivalent Booster 29yrs & up 04/15/2021   Pneumococcal Conjugate-13 09/06/2011   Pneumococcal Polysaccharide-23 08/26/2003, 09/06/2011, 05/15/2019   Pneumococcal-Unspecified 12/20/2013   Td 09/26/2006   Tdap 05/15/2019   Zoster Recombinant(Shingrix) 11/21/2017, 04/08/2018   Zoster, Live 10/25/2007   Pertinent  Health Maintenance Due  Topic Date Due   OPHTHALMOLOGY EXAM  03/24/2023   INFLUENZA VACCINE  Completed   DEXA SCAN  Completed      10/11/2022   10:15 AM 10/20/2022   10:35 AM 10/26/2022    9:26 AM 01/20/2023    3:50 PM 06/15/2023    3:52 PM  Fall Risk  Falls in the past year? 1 1 0 0 0  Was there an injury with Fall? 0 0 0 0 0  Fall Risk Category Calculator 2 1 0 0 0  Patient at Risk for Falls Due to History of fall(s);Impaired balance/gait;Impaired mobility History of fall(s);Impaired balance/gait;Impaired mobility History of fall(s);Impaired balance/gait;Impaired mobility  History of fall(s);Impaired balance/gait;Impaired mobility  Fall risk Follow up Falls evaluation completed;Education provided;Falls prevention discussed Falls evaluation completed;Education provided;Falls prevention discussed Falls evaluation completed  Falls evaluation completed;Education provided;Falls prevention discussed     Vitals:   07/05/23 1304  BP: 128/62  Pulse: 62  Resp: 18  Temp: 97.7 F (36.5 C)  SpO2: 94%  Weight: 145 lb (65.8 kg)  Height: 5\' 4"  (1.626 m)   Body mass index is 24.89 kg/m.  Physical Exam Constitutional:      Appearance: Normal appearance.  HENT:     Head: Normocephalic and atraumatic.     Nose: Nose normal.     Mouth/Throat:     Mouth: Mucous membranes are moist.  Eyes:     Conjunctiva/sclera: Conjunctivae normal.  Cardiovascular:     Rate and Rhythm: Normal rate and regular rhythm.  Pulmonary:     Effort: Pulmonary effort is normal.     Breath sounds:  Normal breath sounds.  Abdominal:     General: Bowel sounds are normal.     Palpations: Abdomen is soft.  Musculoskeletal:     Cervical back: Normal range of motion.  Skin:    General: Skin is warm and dry.  Neurological:     Mental Status: She is alert. Mental status is at baseline.  Psychiatric:        Mood and Affect: Mood normal.        Behavior: Behavior normal.        Labs reviewed: Recent Labs    10/25/22 0710  NA 140  K 4.0  CL 107  CO2 27*  BUN 25*  CREATININE 1.3*  CALCIUM 9.4   Recent Labs    10/25/22 0710  AST 13  ALT 8  ALKPHOS 78  ALBUMIN 3.8   Recent Labs    10/25/22 0710  WBC 4.0  NEUTROABS 2,748.00  HGB 8.5*  HCT 27*  PLT 78*   Lab Results  Component Value Date   TSH 3.25 05/31/2022   No results found for: "HGBA1C" Lab Results  Component Value Date   CHOL 125 10/25/2022   HDL 39 10/25/2022   LDLCALC 69 10/25/2022   TRIG 89 10/25/2022   CHOLHDL 3.8 11/23/2018    Significant Diagnostic Results in last 30 days:  No results found.  Assessment/Plan  1. Generalized anxiety disorder (Primary) -  will re-order Xanax PRN - ALPRAZolam (XANAX) 0.25 MG tablet; Take 1 tablet (0.25 mg total) by mouth 2 (two) times daily as needed for anxiety.  Dispense: 30 tablet; Refill: 0  2. Essential hypertension -  BP 128/62, stable -  continue Losartan   Family/ staff Communication: Discussed plan of care with resident and charge nurse.  Labs/tests ordered: None    Kenard Gower, DNP, MSN, FNP-BC [facility] [phone] (Monday-Friday 8:00 a.m. - 5:00 p.m.) [phone] (after hours)

## 2023-07-14 ENCOUNTER — Encounter: Payer: Self-pay | Admitting: Internal Medicine

## 2023-07-14 ENCOUNTER — Non-Acute Institutional Stay (SKILLED_NURSING_FACILITY): Payer: Medicare Other | Admitting: Internal Medicine

## 2023-07-14 DIAGNOSIS — M545 Low back pain, unspecified: Secondary | ICD-10-CM | POA: Diagnosis not present

## 2023-07-14 DIAGNOSIS — E039 Hypothyroidism, unspecified: Secondary | ICD-10-CM

## 2023-07-14 DIAGNOSIS — F339 Major depressive disorder, recurrent, unspecified: Secondary | ICD-10-CM

## 2023-07-14 DIAGNOSIS — G8929 Other chronic pain: Secondary | ICD-10-CM

## 2023-07-14 DIAGNOSIS — E782 Mixed hyperlipidemia: Secondary | ICD-10-CM | POA: Diagnosis not present

## 2023-07-14 DIAGNOSIS — N1831 Chronic kidney disease, stage 3a: Secondary | ICD-10-CM

## 2023-07-14 DIAGNOSIS — G629 Polyneuropathy, unspecified: Secondary | ICD-10-CM

## 2023-07-14 DIAGNOSIS — I10 Essential (primary) hypertension: Secondary | ICD-10-CM

## 2023-07-14 DIAGNOSIS — D61818 Other pancytopenia: Secondary | ICD-10-CM

## 2023-07-14 DIAGNOSIS — R4189 Other symptoms and signs involving cognitive functions and awareness: Secondary | ICD-10-CM

## 2023-07-14 NOTE — Progress Notes (Signed)
[facility]  SNF (31)  Provider:   Code Status: DNR Goals of Care:     07/05/2023    1:42 PM  Advanced Directives  Does Patient Have a Medical Advance Directive? Yes  Type of Estate agent of [place];Out of facility DNR (pink MOST or yellow form)  Does patient want to make changes to medical advance directive? No - Patient declined  Copy of Healthcare Power of Attorney in Chart? Yes - validated most recent copy scanned in chart (See row information)  Pre-existing out of facility DNR order (yellow form or pink MOST form) Yellow form placed in chart (order not valid for inpatient use)     Chief Complaint  Patient presents with   Care Management    HPI: Patient is a 89 y.o. female seen today for medical management of chronic diseases.   Lives in SNF in [facility]  Patient has h/o Pancytopenia , h/o Chronic Back Pain, Hypertension, Hypothyroidism, Hypertension, Recurrent Cystitis, Osteoporosis and Major Depression   Right Trimalleolar Fracture. S/P ORIF in 09/22 Since then Wheelchair and Michiel Sites dependent Mild Cognitive decline  Anxiety  Was restarted on PRN Xanax for anxiety and crying and today seems like she is better    She is stable. No new Nursing issues. No Behavior issues Her weight is stable Wheelchair and Hoyer dependent No Falls Wt Readings from Last 3 Encounters:  07/05/23 145 lb (65.8 kg)  06/15/23 141 lb 8 oz (64.2 kg)  05/24/23 148 lb 6.4 oz (67.3 kg)    Past Medical History:  Diagnosis Date   Anemia    Anxiety    Chronotropic incompetence 12/2012    potentially medication related; noted on CPET    DDD (degenerative disc disease)    Eczema    GERD (gastroesophageal reflux disease)    H/O hiatal hernia    Hx: UTI (urinary tract infection)    Hyperlipidemia    Hypertension    Hypothyroidism    Osteopenia    Septicemia (HCC) 2002   following UTI   Vertigo, benign positional     Past  Surgical History:  Procedure Laterality Date   BREAST SURGERY     left biopsy   CATARACT EXTRACTION Right    2 weeks ago   CPET / MET - PFTS     Consistent with chronotropic incompetence; See attached report in the results section   DILATION AND CURETTAGE OF UTERUS     x 2   DOPPLER ECHOCARDIOGRAPHY  01/10/2013   Normal LV size and function. Normal EF. Air sclerosis but no stenosis   ESOPHAGOGASTRODUODENOSCOPY  05/04/2012   Procedure: ESOPHAGOGASTRODUODENOSCOPY (EGD);  Surgeon: Louis Meckel, MD;  Location: [facility];  Service: Endoscopy;  Laterality: N/A;   EYE SURGERY     cataract extraction with ILO  ? eye   IR KYPHO EA ADDL LEVEL THORACIC OR LUMBAR  10/28/2016   IR KYPHO LUMBAR INC FX REDUCE BONE BX UNI/BIL CANNULATION INC/IMAGING  10/28/2016   IR KYPHO THORACIC WITH BONE BIOPSY  12/24/2016   IR RADIOLOGIST EVAL & MGMT  11/11/2016   KNEE ARTHROSCOPY  2011   right   ORIF ANKLE FRACTURE Right 03/27/2021   Procedure: OPEN REDUCTION INTERNAL FIXATION (ORIF) ANKLE FRACTURE;  Surgeon: Teryl Lucy, MD;  Location: [facility];  Service: Orthopedics;  Laterality: Right;   TONSILLECTOMY     TOTAL KNEE ARTHROPLASTY  04/24/2012   Procedure: TOTAL KNEE ARTHROPLASTY;  Surgeon: Homero Fellers  Dulcy Fanny, MD;  Location: [facility];  Service: Orthopedics;  Laterality: Right;   TRANSTHORACIC ECHOCARDIOGRAM  02/2018   Ordered by PCP for "congestive heart failure " -EF 60 M 65%.  GR 1 DD.  No R WMA--- > ESSENTIALLY NORMAL    No Known Allergies  Outpatient Encounter Medications as of 07/14/2023  Medication Sig   acetaminophen (TYLENOL) 500 MG tablet Take 1,000 mg by mouth 3 (three) times daily. For pain   ALPRAZolam (XANAX) 0.25 MG tablet Take 1 tablet (0.25 mg total) by mouth 2 (two) times daily as needed for anxiety.   buPROPion (WELLBUTRIN SR) 150 MG 12 hr tablet Take 1 tablet (150 mg total) by mouth 2 (two) times daily after a meal.   calcium citrate-vitamin D (CITRACAL+D) 315-200 MG-UNIT tablet Take 1 tablet by  mouth 2 (two) times daily.   Camphor-Menthol-Methyl Sal 3.07-31-08 % PTCH Place 1 patch onto the skin See admin instructions. Apply one patch onto the skin of the  lower back daily for back pain   Carboxymethylcellulose Sodium (ARTIFICIAL TEARS OP) Place 1 drop into both eyes in the morning, at noon, and at bedtime.   Chlorpheniramine-DM (ROBITUSSIN NIGHTTIME COUGH) 1-7.5 MG/5ML LIQD Take 5 mLs by mouth every 6 (six) hours as needed.   diclofenac Sodium (VOLTAREN) 1 % GEL Apply 4 g topically in the morning and at bedtime. Apply to bilateral ankles BID   ferrous sulfate 325 (65 FE) MG EC tablet Take 325 mg by mouth 2 (two) times a week. Monday & Friday   gabapentin (NEURONTIN) 300 MG capsule Take 1 capsule (300 mg total) by mouth at bedtime.   hydrocortisone cream 1 % Apply 1 Application topically as needed for itching. Apply to skin topically as needed for itching   levothyroxine (SYNTHROID) 75 MCG tablet Take 75 mcg by mouth every morning.   losartan (COZAAR) 25 MG tablet Take 12.5 mg by mouth every morning.   Menthol, Topical Analgesic, (BIOFREEZE) 4 % GEL Apply topically 2 (two) times daily as needed. Apply thin layer to left thigh   nystatin (MYCOSTATIN/NYSTOP) powder Apply 1 application  topically as needed (yeast).   nystatin cream (MYCOSTATIN) Apply 1 Application topically as needed.   omeprazole (PRILOSEC) 40 MG capsule Take 40 mg by mouth every morning.   oxyCODONE (OXY IR/ROXICODONE) 5 MG immediate release tablet Take 0.5 tablets (2.5 mg total) by mouth 2 (two) times daily as needed for severe pain (pain score 7-10).   OXYGEN Inhale into the lungs. For SOB to keep 02 sats >90%; every shift as needed for SOB   polyethylene glycol (MIRALAX / GLYCOLAX) 17 g packet Take 17 g by mouth as directed. Every 2 days   potassium chloride SA (KLOR-CON M) 20 MEQ tablet Take 20 mEq by mouth as directed. Monday, Tuesday, Wednesday, Thursday, and Friday.   sertraline (ZOLOFT) 25 MG tablet Take 50 mg by  mouth every morning.   torsemide (DEMADEX) 20 MG tablet Take 20 mg by mouth as directed. Once A Day on Mon, Tue, Wed, Thu, Fri   zinc oxide 20 % ointment Apply 1 application topically See admin instructions. Apply topically to per/buttocks after every incontinent episode and as needed for redness   No facility-administered encounter medications on file as of 07/14/2023.    Review of Systems:  Review of Systems  Constitutional:  Negative for activity change and appetite change.  HENT: Negative.    Respiratory:  Negative for cough and shortness of breath.   Cardiovascular:  Negative  for leg swelling.  Gastrointestinal:  Negative for constipation.  Genitourinary: Negative.   Musculoskeletal:  Positive for back pain and gait problem. Negative for arthralgias and myalgias.  Skin: Negative.   Neurological:  Negative for dizziness and weakness.  Psychiatric/Behavioral:  Positive for confusion and sleep disturbance. Negative for dysphoric mood.     Health Maintenance  Topic Date Due   OPHTHALMOLOGY EXAM  03/24/2023   COVID-19 Vaccine (9 - 2024-25 season) 07/27/2023   Medicare Annual Wellness (AWV)  09/23/2023   DTaP/Tdap/Td (3 - Td or Tdap) 05/14/2029   Pneumonia Vaccine 33+ Years old  Completed   INFLUENZA VACCINE  Completed   DEXA SCAN  Completed   Zoster Vaccines- Shingrix  Completed   HPV VACCINES  Aged Out    Physical Exam: There were no vitals filed for this visit. There is no height or weight on file to calculate BMI. Physical Exam Vitals reviewed.  Constitutional:      Appearance: Normal appearance.  HENT:     Head: Normocephalic.     Nose: Nose normal.     Mouth/Throat:     Mouth: Mucous membranes are moist.     Pharynx: Oropharynx is clear.  Eyes:     Pupils: Pupils are equal, round, and reactive to light.  Cardiovascular:     Rate and Rhythm: Normal rate and regular rhythm.     Pulses: Normal pulses.     Heart sounds: Normal heart sounds. No murmur  heard. Pulmonary:     Effort: Pulmonary effort is normal.     Breath sounds: Normal breath sounds.  Abdominal:     General: Abdomen is flat. Bowel sounds are normal.     Palpations: Abdomen is soft.  Musculoskeletal:        General: No swelling.     Cervical back: Neck supple.  Skin:    General: Skin is warm.  Neurological:     General: No focal deficit present.     Mental Status: She is alert.  Psychiatric:        Mood and Affect: Mood normal.        Thought Content: Thought content normal.     Labs reviewed: Basic Metabolic Panel: Recent Labs    10/25/22 0710  NA 140  K 4.0  CL 107  CO2 27*  BUN 25*  CREATININE 1.3*  CALCIUM 9.4   Liver Function Tests: Recent Labs    10/25/22 0710  AST 13  ALT 8  ALKPHOS 78  ALBUMIN 3.8   No results for input(s): "LIPASE", "AMYLASE" in the last 8760 hours. No results for input(s): "AMMONIA" in the last 8760 hours. CBC: Recent Labs    10/25/22 0710  WBC 4.0  NEUTROABS 2,748.00  HGB 8.5*  HCT 27*  PLT 78*   Lipid Panel: Recent Labs    10/25/22 0710  CHOL 125  HDL 39  LDLCALC 69  TRIG 89   No results found for: "HGBA1C"  Procedures since last visit: No results found.  Assessment/Plan 1. Pancytopenia (HCC) (Primary) Last HGB 10.4 Platelets 84 No More work up per Hematology  2. Essential hypertension Low dose of Cozaar  3. Chronic bilateral low back pain without sciatica Oxycodone  4. Neuropathy Gabapentin  5. Mixed hyperlipidemia Off statin due to goals of care  6. Stage 3a chronic kidney disease (HCC) Creat stable  7. Acquired hypothyroidism TSH normal in 11/24  8. Depression, recurrent (HCC) Zoloft and Xanax  9. Cognitive impairment Doing well in SNF  Labs/tests ordered:  * No order type specified * Next appt:  Visit date not found

## 2023-07-18 ENCOUNTER — Non-Acute Institutional Stay (SKILLED_NURSING_FACILITY): Payer: Medicare Other | Admitting: Orthopedic Surgery

## 2023-07-18 ENCOUNTER — Encounter: Payer: Self-pay | Admitting: Orthopedic Surgery

## 2023-07-18 DIAGNOSIS — F419 Anxiety disorder, unspecified: Secondary | ICD-10-CM

## 2023-07-18 DIAGNOSIS — F339 Major depressive disorder, recurrent, unspecified: Secondary | ICD-10-CM

## 2023-07-18 DIAGNOSIS — F0392 Unspecified dementia, unspecified severity, with psychotic disturbance: Secondary | ICD-10-CM | POA: Diagnosis not present

## 2023-07-18 NOTE — Progress Notes (Signed)
Location:   [facility]  Nursing Home Room Number: 35-A Place of Service:  ALF [phone]) Provider:  Hazle Nordmann, NP  PCP: Mahlon Gammon, MD  Patient Care Team: Mahlon Gammon, MD as PCP - General (Internal Medicine) Mast, Man X, NP as Nurse Practitioner (Internal Medicine)  Extended Emergency Contact Information Primary Emergency Contact: Coote,[name] Address: [address]          [place], [place] [address] [place] Home Phone: [phone] Mobile Phone: [phone] Relation: Son Secondary Emergency Contact: Tolley, Cindra [place] Home Phone: [phone] Mobile Phone: [phone] Relation: Other  Code Status:  DNR Goals of care: Advanced Directive information    07/18/2023   10:51 AM  Advanced Directives  Does Patient Have a Medical Advance Directive? Yes  Type of Estate agent of [place];Out of facility DNR (pink MOST or yellow form)  Does patient want to make changes to medical advance directive? No - Patient declined  Copy of Healthcare Power of Attorney in Chart? Yes - validated most recent copy scanned in chart (See row information)     Chief Complaint  Patient presents with   Acute Visit    Anxiety    HPI:  Pt is a 89 y.o. female seen today for acute visit due to increased anxiety and hallucinations.   She currently resides on the skilled nursing unit at [facility]. PMH: HTN, HLD, PVD, CKD, esophagitis, GERD, GI bleed, thrombocytopenia, pancytopenia, hypothyroidism, OA, L2 compression fracture, right trimalleolar fracture s/p ORIF 03/2021, chronic back pain, depression and anxiety.   H/o dementia with hallucinations, recurrent depression and anxiety. Last night she called 911 since she was scared. She cannot recall what she was scared of. Her son, Tammy Sours, has been receiving more calls from mom within past week. He is concerned behaviors are associated with UTI. Remains on Wellbutrin, zoloft and xanax prn.  No aggressive behaviors. Recent BIMS 14/15 per chart review. Appropriate during out encounter. Afebrile. Vitals stable.     Past Medical History:  Diagnosis Date   Anemia    Anxiety    Chronotropic incompetence 12/2012    potentially medication related; noted on CPET    DDD (degenerative disc disease)    Eczema    GERD (gastroesophageal reflux disease)    H/O hiatal hernia    Hx: UTI (urinary tract infection)    Hyperlipidemia    Hypertension    Hypothyroidism    Osteopenia    Septicemia (HCC) 2002   following UTI   Vertigo, benign positional    Past Surgical History:  Procedure Laterality Date   BREAST SURGERY     left biopsy   CATARACT EXTRACTION Right    2 weeks ago   CPET / MET - PFTS     Consistent with chronotropic incompetence; See attached report in the results section   DILATION AND CURETTAGE OF UTERUS     x 2   DOPPLER ECHOCARDIOGRAPHY  01/10/2013   Normal LV size and function. Normal EF. Air sclerosis but no stenosis   ESOPHAGOGASTRODUODENOSCOPY  05/04/2012   Procedure: ESOPHAGOGASTRODUODENOSCOPY (EGD);  Surgeon: Louis Meckel, MD;  Location: [facility];  Service: Endoscopy;  Laterality: N/A;   EYE SURGERY     cataract extraction with ILO  ? eye   IR KYPHO EA ADDL LEVEL THORACIC OR LUMBAR  10/28/2016   IR KYPHO LUMBAR INC FX REDUCE BONE BX UNI/BIL CANNULATION INC/IMAGING  10/28/2016   IR KYPHO THORACIC WITH  BONE BIOPSY  12/24/2016   IR RADIOLOGIST EVAL & MGMT  11/11/2016   KNEE ARTHROSCOPY  2011   right   ORIF ANKLE FRACTURE Right 03/27/2021   Procedure: OPEN REDUCTION INTERNAL FIXATION (ORIF) ANKLE FRACTURE;  Surgeon: Teryl Lucy, MD;  Location: [facility];  Service: Orthopedics;  Laterality: Right;   TONSILLECTOMY     TOTAL KNEE ARTHROPLASTY  04/24/2012   Procedure: TOTAL KNEE ARTHROPLASTY;  Surgeon: Loanne Drilling, MD;  Location: [facility];  Service: Orthopedics;  Laterality: Right;   TRANSTHORACIC ECHOCARDIOGRAM  02/2018   Ordered by PCP for "congestive heart  failure " -EF 60 M 65%.  GR 1 DD.  No R WMA--- > ESSENTIALLY NORMAL    No Known Allergies  Allergies as of 07/18/2023   No Known Allergies      Medication List        Accurate as of July 18, 2023 10:53 AM. If you have any questions, ask your nurse or doctor.          acetaminophen 500 MG tablet Commonly known as: TYLENOL Take 1,000 mg by mouth 3 (three) times daily. For pain   ALPRAZolam 0.25 MG tablet Commonly known as: XANAX Take 1 tablet (0.25 mg total) by mouth 2 (two) times daily as needed for anxiety.   ARTIFICIAL TEARS OP Place 1 drop into both eyes in the morning, at noon, and at bedtime.   Biofreeze 4 % Gel Generic drug: Menthol (Topical Analgesic) Apply topically 2 (two) times daily as needed. Apply thin layer to left thigh   buPROPion ER 100 MG 12 hr tablet Commonly known as: WELLBUTRIN SR Take 100 mg by mouth 2 (two) times daily. What changed: Another medication with the same name was removed. Continue taking this medication, and follow the directions you see here. Changed by: Octavia Heir   calcium citrate-vitamin D 315-200 MG-UNIT tablet Commonly known as: CITRACAL+D Take 1 tablet by mouth 2 (two) times daily.   Camphor-Menthol-Methyl Sal 3.07-31-08 % Ptch Place 1 patch onto the skin See admin instructions. Apply one patch onto the skin of the  lower back daily for back pain   diclofenac Sodium 1 % Gel Commonly known as: VOLTAREN Apply 4 g topically in the morning and at bedtime. Apply to bilateral ankles BID   ferrous sulfate 325 (65 FE) MG EC tablet Take 325 mg by mouth 2 (two) times a week. Monday & Friday   gabapentin 300 MG capsule Commonly known as: Neurontin Take 1 capsule (300 mg total) by mouth at bedtime.   hydrocortisone cream 1 % Apply 1 Application topically as needed for itching. Apply to skin topically as needed for itching   levothyroxine 75 MCG tablet Commonly known as: SYNTHROID Take 75 mcg by mouth every morning.    losartan 25 MG tablet Commonly known as: COZAAR Take 12.5 mg by mouth every morning.   nystatin powder Commonly known as: MYCOSTATIN/NYSTOP Apply 1 application  topically as needed (yeast).   nystatin cream Commonly known as: MYCOSTATIN Apply 1 Application topically as needed.   omeprazole 40 MG capsule Commonly known as: PRILOSEC Take 40 mg by mouth every morning.   oxyCODONE 5 MG immediate release tablet Commonly known as: Oxy IR/ROXICODONE Take 0.5 tablets (2.5 mg total) by mouth 2 (two) times daily as needed for severe pain (pain score 7-10).   OXYGEN Inhale into the lungs. For SOB to keep 02 sats >90%; every shift as needed for SOB   polyethylene glycol 17 g packet  Commonly known as: MIRALAX / GLYCOLAX Take 17 g by mouth as directed. Every 2 days   potassium chloride SA 20 MEQ tablet Commonly known as: KLOR-CON M Take 20 mEq by mouth as directed. Monday, Tuesday, Wednesday, Thursday, and Friday.   Robitussin Nighttime Cough 1-7.5 MG/5ML Liqd Generic drug: Chlorpheniramine-DM Take 5 mLs by mouth every 6 (six) hours as needed.   sertraline 25 MG tablet Commonly known as: ZOLOFT Take 50 mg by mouth every morning.   torsemide 20 MG tablet Commonly known as: DEMADEX Take 20 mg by mouth as directed. Once A Day on Mon, Tue, Wed, Thu, Fri   zinc oxide 20 % ointment Apply 1 application topically See admin instructions. Apply topically to per/buttocks after every incontinent episode and as needed for redness        Review of Systems  Constitutional:  Negative for activity change and appetite change.  HENT:  Negative for sore throat and trouble swallowing.   Eyes:  Negative for visual disturbance.  Respiratory:  Negative for cough and shortness of breath.   Cardiovascular:  Negative for chest pain and leg swelling.  Gastrointestinal:  Negative for abdominal distention and abdominal pain.  Genitourinary:  Positive for frequency. Negative for dysuria and hematuria.   Musculoskeletal:  Positive for gait problem.  Skin:  Negative for wound.  Neurological:  Positive for weakness. Negative for dizziness and headaches.  Psychiatric/Behavioral:  Positive for confusion, dysphoric mood and hallucinations. Negative for agitation and sleep disturbance.     Immunization History  Administered Date(s) Administered   Fluad Quad(high Dose 65+) 05/19/2022, 05/25/2023   Influenza, High Dose Seasonal PF 04/26/2016, 03/22/2017, 05/08/2019   Influenza,inj,Quad PF,6+ Mos 04/27/2018   Influenza-Unspecified 04/22/2011, 04/04/2012, 05/03/2013, 05/04/2013, 04/17/2014, 04/19/2015, 04/26/2016, 04/22/2017, 05/20/2021   Moderna Covid-19 Vaccine Bivalent Booster 61yrs & up 06/01/2023   Moderna SARS-COV2 Booster Vaccination 06/09/2020, 12/31/2020   Moderna Sars-Covid-2 Vaccination 07/30/2019, 08/27/2019, 01/06/2022   PFIZER(Purple Top)SARS-COV-2 Vaccination 06/01/2022   Pfizer Covid-19 Vaccine Bivalent Booster 64yrs & up 04/15/2021   Pneumococcal Conjugate-13 09/06/2011   Pneumococcal Polysaccharide-23 08/26/2003, 09/06/2011, 05/15/2019   Pneumococcal-Unspecified 12/20/2013   Td 09/26/2006   Tdap 05/15/2019   Zoster Recombinant(Shingrix) 11/21/2017, 04/08/2018   Zoster, Live 10/25/2007   Pertinent  Health Maintenance Due  Topic Date Due   OPHTHALMOLOGY EXAM  03/24/2023   INFLUENZA VACCINE  Completed   DEXA SCAN  Completed      10/11/2022   10:15 AM 10/20/2022   10:35 AM 10/26/2022    9:26 AM 01/20/2023    3:50 PM 06/15/2023    3:52 PM  Fall Risk  Falls in the past year? 1 1 0 0 0  Was there an injury with Fall? 0 0 0 0 0  Fall Risk Category Calculator 2 1 0 0 0  Patient at Risk for Falls Due to History of fall(s);Impaired balance/gait;Impaired mobility History of fall(s);Impaired balance/gait;Impaired mobility History of fall(s);Impaired balance/gait;Impaired mobility  History of fall(s);Impaired balance/gait;Impaired mobility  Fall risk Follow up Falls evaluation  completed;Education provided;Falls prevention discussed Falls evaluation completed;Education provided;Falls prevention discussed Falls evaluation completed  Falls evaluation completed;Education provided;Falls prevention discussed   Functional Status Survey:    Vitals:   07/18/23 1044  BP: (!) 123/57  Pulse: (!) 54  Resp: 18  Temp: (!) 96 F (35.6 C)  SpO2: 96%  Weight: 145 lb (65.8 kg)  Height: 5\' 4"  (1.626 m)   Body mass index is 24.89 kg/m. Physical Exam Vitals reviewed.  Constitutional:  General: She is not in acute distress.    Comments: Pale complexion  HENT:     Head: Normocephalic.  Eyes:     General:        Right eye: No discharge.        Left eye: No discharge.  Cardiovascular:     Rate and Rhythm: Normal rate and regular rhythm.     Pulses: Normal pulses.     Heart sounds: Normal heart sounds.  Pulmonary:     Effort: Pulmonary effort is normal.     Breath sounds: Normal breath sounds.  Abdominal:     General: Bowel sounds are normal.     Palpations: Abdomen is soft.  Musculoskeletal:     Cervical back: Neck supple.     Right lower leg: No edema.     Left lower leg: No edema.  Skin:    General: Skin is warm.     Capillary Refill: Capillary refill takes less than 2 seconds.  Neurological:     General: No focal deficit present.     Mental Status: She is alert. Mental status is at baseline.     Motor: Weakness present.     Gait: Gait abnormal.  Psychiatric:        Mood and Affect: Mood normal.     Comments: Very pleasant, alert to self/person, follows commands, able to express needs     Labs reviewed: Recent Labs    10/25/22 0710 04/28/23 0000 05/16/23 0000  NA 140 139 134*  K 4.0 4.2 4.0  CL 107 102 99  CO2 27* 24* 25*  BUN 25* 22* 17  CREATININE 1.3* 1.2* 1.2*  CALCIUM 9.4 9.6 8.6*   Recent Labs    10/25/22 0710 04/28/23 0000  AST 13 11*  ALT 8 8  ALKPHOS 78 77  ALBUMIN 3.8 4.1   Recent Labs    10/25/22 0710 04/28/23 0000  05/16/23 0000  WBC 4.0 3.5 12.0  NEUTROABS 2,748.00 2,111.00 10,212.00  HGB 8.5* 10.4* 8.9*  HCT 27* 33* 29*  PLT 78* 84* 67*   Lab Results  Component Value Date   TSH 2.53 06/16/2023   No results found for: "HGBA1C" Lab Results  Component Value Date   CHOL 125 10/25/2022   HDL 39 10/25/2022   LDLCALC 69 10/25/2022   TRIG 89 10/25/2022   CHOLHDL 3.8 11/23/2018    Significant Diagnostic Results in last 30 days:  No results found.  Assessment/Plan 1. 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 2 episodes charted in chart review - son reports more calls within past week - cont Wellbutrin and Zoloft - will increase Zoloft to 75 mg po daily  - cont xanax prn  2. Depression, recurrent (HCC) - see above - bmp 07/27/2022  3. Anxiety - associated with dementia/hallucinations - cont xanax prn     Family/ staff Communication: plan discussed with patient and nurse  Labs/tests ordered:  UA/culture, bmp 01/02

## 2023-07-19 DIAGNOSIS — N390 Urinary tract infection, site not specified: Secondary | ICD-10-CM | POA: Diagnosis not present

## 2023-07-21 DIAGNOSIS — R197 Diarrhea, unspecified: Secondary | ICD-10-CM | POA: Diagnosis not present

## 2023-07-22 ENCOUNTER — Encounter: Payer: Self-pay | Admitting: Orthopedic Surgery

## 2023-07-22 ENCOUNTER — Non-Acute Institutional Stay (SKILLED_NURSING_FACILITY): Payer: Medicare Other | Admitting: Orthopedic Surgery

## 2023-07-22 DIAGNOSIS — N3000 Acute cystitis without hematuria: Secondary | ICD-10-CM | POA: Diagnosis not present

## 2023-07-22 DIAGNOSIS — K5909 Other constipation: Secondary | ICD-10-CM | POA: Diagnosis not present

## 2023-07-22 MED ORDER — POLYETHYLENE GLYCOL 3350 17 G PO PACK: 17.0000 g | PACK | Freq: Two times a day (BID) | ORAL | Status: AC

## 2023-07-22 MED ORDER — CIPROFLOXACIN HCL 500 MG PO TABS: 500.0000 mg | ORAL_TABLET | Freq: Two times a day (BID) | ORAL | Status: AC

## 2023-07-22 NOTE — Progress Notes (Signed)
Location:   [facility]  Nursing Home Room Number: 35-A Place of Service:  SNF [phone]) Provider:  Hazle Nordmann, NP  PCP: Mahlon Gammon, MD  Patient Care Team: Mahlon Gammon, MD as PCP - General (Internal Medicine) Mast, Man X, NP as Nurse Practitioner (Internal Medicine)  Extended Emergency Contact Information Primary Emergency Contact: Klayman,[name] Address: [address]          [place], [place] [address] [place] Home Phone: [phone] Mobile Phone: [phone] Relation: Son Secondary Emergency Contact: [place], Cindra Eves [place] Home Phone: [phone] Mobile Phone: [phone] Relation: Other  Code Status:  DNR Goals of care: Advanced Directive information    07/22/2023   10:29 AM  Advanced Directives  Does Patient Have a Medical Advance Directive? Yes  Type of Estate agent of [place];Out of facility DNR (pink MOST or yellow form)  Does patient want to make changes to medical advance directive? No - Patient declined  Copy of Healthcare Power of Attorney in Chart? Yes - validated most recent copy scanned in chart (See row information)     Chief Complaint  Patient presents with   Acute Visit    Diarrhea.     HPI:  Pt is a 89 y.o. female seen today for acute visit due to diarrhea.   She currently resides on the skilled nursing unit at [facility] PMH: HTN, HLD, PVD, CKD, esophagitis, GERD, GI bleed, thrombocytopenia, pancytopenia, hypothyroidism, OA, L2 compression fracture, right trimalleolar fracture s/p ORIF 03/2021, chronic back pain, depression and anxiety.   Increased diarrhea x 1 week. Poor historian due to dementia. 12/26 KUB reveals moderate fecal matter in colon. Denies abdominal pain, N/V, low back pain. She is prescribed miralax every 2 days.   12/23 increased hallucinations x 1 week. Zoloft was increased to 75 mg daily. UA/culture ordered. UA + nitrates, 1+ leukocytes. Urine culture > 100,000  cfu/mL proteus mirabilis. Does not always drink water well. Incontinent.    Past Medical History:  Diagnosis Date   Anemia    Anxiety    Chronotropic incompetence 12/2012    potentially medication related; noted on CPET    DDD (degenerative disc disease)    Eczema    GERD (gastroesophageal reflux disease)    H/O hiatal hernia    Hx: UTI (urinary tract infection)    Hyperlipidemia    Hypertension    Hypothyroidism    Osteopenia    Septicemia (HCC) 2002   following UTI   Vertigo, benign positional    Past Surgical History:  Procedure Laterality Date   BREAST SURGERY     left biopsy   CATARACT EXTRACTION Right    2 weeks ago   CPET / MET - PFTS     Consistent with chronotropic incompetence; See attached report in the results section   DILATION AND CURETTAGE OF UTERUS     x 2   DOPPLER ECHOCARDIOGRAPHY  01/10/2013   Normal LV size and function. Normal EF. Air sclerosis but no stenosis   ESOPHAGOGASTRODUODENOSCOPY  05/04/2012   Procedure: ESOPHAGOGASTRODUODENOSCOPY (EGD);  Surgeon: Louis Meckel, MD;  Location: [facility];  Service: Endoscopy;  Laterality: N/A;   EYE SURGERY     cataract extraction with ILO  ? eye   IR KYPHO EA ADDL LEVEL THORACIC OR LUMBAR  10/28/2016   IR KYPHO LUMBAR INC FX REDUCE BONE BX UNI/BIL CANNULATION INC/IMAGING  10/28/2016   IR KYPHO THORACIC WITH BONE BIOPSY  12/24/2016  IR RADIOLOGIST EVAL & MGMT  11/11/2016   KNEE ARTHROSCOPY  2011   right   ORIF ANKLE FRACTURE Right 03/27/2021   Procedure: OPEN REDUCTION INTERNAL FIXATION (ORIF) ANKLE FRACTURE;  Surgeon: Teryl Lucy, MD;  Location: [facility];  Service: Orthopedics;  Laterality: Right;   TONSILLECTOMY     TOTAL KNEE ARTHROPLASTY  04/24/2012   Procedure: TOTAL KNEE ARTHROPLASTY;  Surgeon: Loanne Drilling, MD;  Location: [facility];  Service: Orthopedics;  Laterality: Right;   TRANSTHORACIC ECHOCARDIOGRAM  02/2018   Ordered by PCP for "congestive heart failure " -EF 60 M 65%.  GR 1 DD.  No R WMA--- >  ESSENTIALLY NORMAL    No Known Allergies  Allergies as of 07/22/2023   No Known Allergies      Medication List        Accurate as of July 22, 2023 10:29 AM. If you have any questions, ask your nurse or doctor.          STOP taking these medications    ALPRAZolam 0.25 MG tablet Commonly known as: XANAX Stopped by: Octavia Heir   Robitussin Nighttime Cough 1-7.5 MG/5ML Liqd Generic drug: Chlorpheniramine-DM Stopped by: Octavia Heir       TAKE these medications    acetaminophen 500 MG tablet Commonly known as: TYLENOL Take 1,000 mg by mouth 3 (three) times daily. For pain   ARTIFICIAL TEARS OP Place 1 drop into both eyes in the morning, at noon, and at bedtime.   Biofreeze 4 % Gel Generic drug: Menthol (Topical Analgesic) Apply topically 2 (two) times daily as needed. Apply thin layer to left thigh   buPROPion ER 100 MG 12 hr tablet Commonly known as: WELLBUTRIN SR Take 100 mg by mouth 2 (two) times daily.   calcium citrate-vitamin D 315-200 MG-UNIT tablet Commonly known as: CITRACAL+D Take 1 tablet by mouth 2 (two) times daily.   Camphor-Menthol-Methyl Sal 3.07-31-08 % Ptch Place 1 patch onto the skin See admin instructions. Apply one patch onto the skin of the  lower back daily for back pain   diclofenac Sodium 1 % Gel Commonly known as: VOLTAREN Apply 4 g topically in the morning and at bedtime. Apply to bilateral ankles BID   ferrous sulfate 325 (65 FE) MG EC tablet Take 325 mg by mouth 2 (two) times a week. Monday & Friday   gabapentin 300 MG capsule Commonly known as: Neurontin Take 1 capsule (300 mg total) by mouth at bedtime.   hydrocortisone cream 1 % Apply 1 Application topically as needed for itching. Apply to skin topically as needed for itching   levothyroxine 75 MCG tablet Commonly known as: SYNTHROID Take 75 mcg by mouth every morning.   losartan 25 MG tablet Commonly known as: COZAAR Take 12.5 mg by mouth every morning.    nystatin powder Commonly known as: MYCOSTATIN/NYSTOP Apply 1 application  topically as needed (yeast).   nystatin cream Commonly known as: MYCOSTATIN Apply 1 Application topically as needed.   omeprazole 40 MG capsule Commonly known as: PRILOSEC Take 40 mg by mouth every morning.   oxyCODONE 5 MG immediate release tablet Commonly known as: Oxy IR/ROXICODONE Take 0.5 tablets (2.5 mg total) by mouth 2 (two) times daily as needed for severe pain (pain score 7-10).   OXYGEN Inhale into the lungs. For SOB to keep 02 sats >90%; every shift as needed for SOB   polyethylene glycol 17 g packet Commonly known as: MIRALAX / GLYCOLAX Take 17 g by  mouth as directed. Every 2 days   potassium chloride SA 20 MEQ tablet Commonly known as: KLOR-CON M Take 20 mEq by mouth as directed. Monday, Tuesday, Wednesday, Thursday, and Friday.   sertraline 25 MG tablet Commonly known as: ZOLOFT Take 50 mg by mouth every morning.   torsemide 20 MG tablet Commonly known as: DEMADEX Take 20 mg by mouth as directed. Once A Day on Mon, Tue, Wed, Thu, Fri   zinc oxide 20 % ointment Apply 1 application topically See admin instructions. Apply topically to per/buttocks after every incontinent episode and as needed for redness        Review of Systems  Unable to perform ROS: Dementia    Immunization History  Administered Date(s) Administered   Fluad Quad(high Dose 65+) 05/19/2022, 05/25/2023   Influenza, High Dose Seasonal PF 04/26/2016, 03/22/2017, 05/08/2019   Influenza,inj,Quad PF,6+ Mos 04/27/2018   Influenza-Unspecified 04/22/2011, 04/04/2012, 05/03/2013, 05/04/2013, 04/17/2014, 04/19/2015, 04/26/2016, 04/22/2017, 05/20/2021   Moderna Covid-19 Vaccine Bivalent Booster 38yrs & up 06/01/2023   Moderna SARS-COV2 Booster Vaccination 06/09/2020, 12/31/2020   Moderna Sars-Covid-2 Vaccination 07/30/2019, 08/27/2019, 01/06/2022   PFIZER(Purple Top)SARS-COV-2 Vaccination 06/01/2022   Pfizer Covid-19  Vaccine Bivalent Booster 59yrs & up 04/15/2021   Pneumococcal Conjugate-13 09/06/2011   Pneumococcal Polysaccharide-23 08/26/2003, 09/06/2011, 05/15/2019   Pneumococcal-Unspecified 12/20/2013   Td 09/26/2006   Tdap 05/15/2019   Zoster Recombinant(Shingrix) 11/21/2017, 04/08/2018   Zoster, Live 10/25/2007   Pertinent  Health Maintenance Due  Topic Date Due   OPHTHALMOLOGY EXAM  03/24/2023   INFLUENZA VACCINE  Completed   DEXA SCAN  Completed      10/11/2022   10:15 AM 10/20/2022   10:35 AM 10/26/2022    9:26 AM 01/20/2023    3:50 PM 06/15/2023    3:52 PM  Fall Risk  Falls in the past year? 1 1 0 0 0  Was there an injury with Fall? 0 0 0 0 0  Fall Risk Category Calculator 2 1 0 0 0  Patient at Risk for Falls Due to History of fall(s);Impaired balance/gait;Impaired mobility History of fall(s);Impaired balance/gait;Impaired mobility History of fall(s);Impaired balance/gait;Impaired mobility  History of fall(s);Impaired balance/gait;Impaired mobility  Fall risk Follow up Falls evaluation completed;Education provided;Falls prevention discussed Falls evaluation completed;Education provided;Falls prevention discussed Falls evaluation completed  Falls evaluation completed;Education provided;Falls prevention discussed   Functional Status Survey:    Vitals:   07/22/23 1021  BP: (!) 148/70  Pulse: 67  Resp: 18  Temp: (!) 95.1 F (35.1 C)  SpO2: 95%  Weight: 145 lb (65.8 kg)  Height: 5\' 4"  (1.626 m)   Body mass index is 24.89 kg/m. Physical Exam Vitals reviewed.  Constitutional:      General: She is not in acute distress. HENT:     Head: Normocephalic.  Eyes:     General:        Right eye: No discharge.        Left eye: No discharge.  Cardiovascular:     Rate and Rhythm: Normal rate and regular rhythm.     Pulses: Normal pulses.     Heart sounds: Normal heart sounds.  Pulmonary:     Effort: Pulmonary effort is normal.     Breath sounds: Normal breath sounds.  Abdominal:      General: Bowel sounds are normal. There is no distension.     Palpations: Abdomen is soft. There is no mass.     Tenderness: There is no abdominal tenderness. There is no guarding or rebound.  Hernia: No hernia is present.  Musculoskeletal:     Cervical back: Neck supple.  Skin:    General: Skin is warm.     Capillary Refill: Capillary refill takes less than 2 seconds.  Neurological:     General: No focal deficit present.     Mental Status: She is alert. Mental status is at baseline.     Motor: Weakness present.     Gait: Gait abnormal.  Psychiatric:        Mood and Affect: Mood normal.     Labs reviewed: Recent Labs    10/25/22 0710 04/28/23 0000 05/16/23 0000  NA 140 139 134*  K 4.0 4.2 4.0  CL 107 102 99  CO2 27* 24* 25*  BUN 25* 22* 17  CREATININE 1.3* 1.2* 1.2*  CALCIUM 9.4 9.6 8.6*   Recent Labs    10/25/22 0710 04/28/23 0000  AST 13 11*  ALT 8 8  ALKPHOS 78 77  ALBUMIN 3.8 4.1   Recent Labs    10/25/22 0710 04/28/23 0000 05/16/23 0000  WBC 4.0 3.5 12.0  NEUTROABS 2,748.00 2,111.00 10,212.00  HGB 8.5* 10.4* 8.9*  HCT 27* 33* 29*  PLT 78* 84* 67*   Lab Results  Component Value Date   TSH 2.53 06/16/2023   No results found for: "HGBA1C" Lab Results  Component Value Date   CHOL 125 10/25/2022   HDL 39 10/25/2022   LDLCALC 69 10/25/2022   TRIG 89 10/25/2022   CHOLHDL 3.8 11/23/2018    Significant Diagnostic Results in last 30 days:  No results found.  Assessment/Plan 1. Chronic constipation (Primary) - diarrhea x 1 week - 12/26 KUB> moderate fecal material in colon - start miralax po BID x 2 days  2. Acute cystitis without hematuria - increased hallucinations/anxiety x 1 week - Zoloft increased to 75 mg - UA + nitrates, 1+ leukocytes - Urine culture > 100,000 cfu/mL proteus mirabilis - start cipro 500 mg po BID x 5 days  - encourage hydration with water    Family/ staff Communication: plan discussed with patient and  nurse  Labs/tests ordered:  none

## 2023-07-28 DIAGNOSIS — I10 Essential (primary) hypertension: Secondary | ICD-10-CM | POA: Diagnosis not present

## 2023-07-28 LAB — BASIC METABOLIC PANEL
BUN: 21 (ref 4–21)
CO2: 32 — AB (ref 13–22)
Chloride: 105 (ref 99–108)
Creatinine: 1.1 (ref 0.5–1.1)
Glucose: 80
Potassium: 4.3 meq/L (ref 3.5–5.1)
Sodium: 139 (ref 137–147)

## 2023-07-28 LAB — COMPREHENSIVE METABOLIC PANEL: Calcium: 9.1 (ref 8.7–10.7)

## 2023-08-03 ENCOUNTER — Non-Acute Institutional Stay (SKILLED_NURSING_FACILITY): Payer: Medicare Other | Admitting: Orthopedic Surgery

## 2023-08-03 ENCOUNTER — Encounter: Payer: Self-pay | Admitting: Orthopedic Surgery

## 2023-08-03 DIAGNOSIS — R509 Fever, unspecified: Secondary | ICD-10-CM | POA: Diagnosis not present

## 2023-08-03 DIAGNOSIS — R051 Acute cough: Secondary | ICD-10-CM | POA: Diagnosis not present

## 2023-08-03 DIAGNOSIS — R400 Somnolence: Secondary | ICD-10-CM

## 2023-08-03 DIAGNOSIS — R062 Wheezing: Secondary | ICD-10-CM | POA: Diagnosis not present

## 2023-08-03 LAB — COMPREHENSIVE METABOLIC PANEL: Calcium: 9.6 (ref 8.7–10.7)

## 2023-08-03 LAB — CBC AND DIFFERENTIAL
HCT: 28 — AB (ref 36–46)
Hemoglobin: 8.7 — AB (ref 12.0–16.0)
Neutrophils Absolute: 14438
Platelets: 69 10*3/uL — AB (ref 150–400)
WBC: 16.5

## 2023-08-03 LAB — CBC: RBC: 3.15 — AB (ref 3.87–5.11)

## 2023-08-03 LAB — BASIC METABOLIC PANEL
BUN: 19 (ref 4–21)
CO2: 27 — AB (ref 13–22)
Chloride: 101 (ref 99–108)
Creatinine: 1.2 — AB (ref 0.5–1.1)
Glucose: 94
Potassium: 3.9 meq/L (ref 3.5–5.1)
Sodium: 138 (ref 137–147)

## 2023-08-03 MED ORDER — CEFTRIAXONE SODIUM 1 G IJ SOLR: 1.0000 g | Freq: Two times a day (BID) | INTRAMUSCULAR | Status: AC

## 2023-08-03 NOTE — Progress Notes (Addendum)
 Location:  [facility] Room Number: 35/A Place of Service:  SNF [phone]) Provider:  Greig FORBES Cluster, NP   Charlanne Fredia CROME, MD  Patient Care Team: Charlanne Fredia CROME, MD as PCP - General (Internal Medicine) Mast, Man X, NP as Nurse Practitioner (Internal Medicine)  Extended Emergency Contact Information Primary Emergency Contact: Penza,[name] Address: [address]          [place], [place] [address] [place] Home Phone: [phone] Mobile Phone: [phone] Relation: Son Secondary Emergency Contact: [name]  [place] Home Phone: [phone] Mobile Phone: [phone] Relation: Other  Code Status:  DNR Goals of care: Advanced Directive information    07/22/2023   10:29 AM  Advanced Directives  Does Patient Have a Medical Advance Directive? Yes  Type of Estate Agent of [place];Out of facility DNR (pink MOST or yellow form)  Does patient want to make changes to medical advance directive? No - Patient declined  Copy of Healthcare Power of Attorney in Chart? Yes - validated most recent copy scanned in chart (See row information)     Chief Complaint  Patient presents with   Acute Visit    Cough, somnolence    HPI:  Pt is a 89 y.o. female seen today for acute visit due to acute cough and somnolence.   She currently resides on the skilled nursing unit at [facility]. PMH: HTN, HLD, PVD, CKD, esophagitis, GERD, GI bleed, thrombocytopenia, pancytopenia, hypothyroidism, OA, L2 compression fracture, right trimalleolar fracture s/p ORIF 03/2021, chronic back pain, depression and anxiety.   Acute cough and somnolence began yesterday evening. Nursing described cough at wet. This morning will open eyes to voice and hand rub to chest. She is unable to express needs. She is not eating or drinking. Current temperature 99.8. Treatment options discussed with son, Mazel Villela. He does not want future hospitalizations.   H/o  RUL infiltrate 09/24/2021> resolved with Levaquin.      Past Medical History:  Diagnosis Date   Anemia    Anxiety    Chronotropic incompetence 12/2012    potentially medication related; noted on CPET    DDD (degenerative disc disease)    Eczema    GERD (gastroesophageal reflux disease)    H/O hiatal hernia    Hx: UTI (urinary tract infection)    Hyperlipidemia    Hypertension    Hypothyroidism    Osteopenia    Septicemia (HCC) 2002   following UTI   Vertigo, benign positional    Past Surgical History:  Procedure Laterality Date   BREAST SURGERY     left biopsy   CATARACT EXTRACTION Right    2 weeks ago   CPET / MET - PFTS     Consistent with chronotropic incompetence; See attached report in the results section   DILATION AND CURETTAGE OF UTERUS     x 2   DOPPLER ECHOCARDIOGRAPHY  01/10/2013   Normal LV size and function. Normal EF. Air sclerosis but no stenosis   ESOPHAGOGASTRODUODENOSCOPY  05/04/2012   Procedure: ESOPHAGOGASTRODUODENOSCOPY (EGD);  Surgeon: Lamar JONETTA Aho, MD;  Location: [facility];  Service: Endoscopy;  Laterality: N/A;   EYE SURGERY     cataract extraction with ILO  ? eye   IR KYPHO EA ADDL LEVEL THORACIC OR LUMBAR  10/28/2016   IR KYPHO LUMBAR INC FX REDUCE BONE BX UNI/BIL CANNULATION INC/IMAGING  10/28/2016   IR KYPHO THORACIC WITH BONE BIOPSY  12/24/2016   IR  RADIOLOGIST EVAL & MGMT  11/11/2016   KNEE ARTHROSCOPY  2011   right   ORIF ANKLE FRACTURE Right 03/27/2021   Procedure: OPEN REDUCTION INTERNAL FIXATION (ORIF) ANKLE FRACTURE;  Surgeon: Josefina Chew, MD;  Location: [facility];  Service: Orthopedics;  Laterality: Right;   TONSILLECTOMY     TOTAL KNEE ARTHROPLASTY  04/24/2012   Procedure: TOTAL KNEE ARTHROPLASTY;  Surgeon: Dempsey LULLA Moan, MD;  Location: [facility];  Service: Orthopedics;  Laterality: Right;   TRANSTHORACIC ECHOCARDIOGRAM  02/2018   Ordered by PCP for congestive heart failure  -EF 60 M 65%.  GR 1 DD.  No R WMA--- > ESSENTIALLY NORMAL     No Known Allergies  Outpatient Encounter Medications as of 08/03/2023  Medication Sig   acetaminophen  (TYLENOL ) 500 MG tablet Take 1,000 mg by mouth 3 (three) times daily. For pain   buPROPion  ER (WELLBUTRIN  SR) 100 MG 12 hr tablet Take 100 mg by mouth 2 (two) times daily.   calcium  citrate-vitamin D  (CITRACAL+D) 315-200 MG-UNIT tablet Take 1 tablet by mouth 2 (two) times daily.   Camphor-Menthol -Methyl Sal 3.07-31-08 % PTCH Place 1 patch onto the skin See admin instructions. Apply one patch onto the skin of the  lower back daily for back pain   Carboxymethylcellulose Sodium (ARTIFICIAL TEARS OP) Place 1 drop into both eyes in the morning, at noon, and at bedtime.   diclofenac Sodium (VOLTAREN) 1 % GEL Apply 4 g topically in the morning and at bedtime. Apply to bilateral ankles BID   ferrous sulfate  325 (65 FE) MG EC tablet Take 325 mg by mouth 2 (two) times a week. Monday & Friday   gabapentin  (NEURONTIN ) 300 MG capsule Take 1 capsule (300 mg total) by mouth at bedtime.   hydrocortisone cream 1 % Apply 1 Application topically as needed for itching. Apply to skin topically as needed for itching   levothyroxine  (SYNTHROID ) 75 MCG tablet Take 75 mcg by mouth every morning.   losartan  (COZAAR ) 25 MG tablet Take 12.5 mg by mouth every morning.   Menthol , Topical Analgesic, (BIOFREEZE) 4 % GEL Apply topically 2 (two) times daily as needed. Apply thin layer to left thigh   nystatin (MYCOSTATIN/NYSTOP) powder Apply 1 application  topically as needed (yeast).   nystatin cream (MYCOSTATIN) Apply 1 Application topically as needed.   omeprazole  (PRILOSEC) 40 MG capsule Take 40 mg by mouth every morning.   oxyCODONE  (OXY IR/ROXICODONE ) 5 MG immediate release tablet Take 0.5 tablets (2.5 mg total) by mouth 2 (two) times daily as needed for severe pain (pain score 7-10).   OXYGEN Inhale into the lungs. For SOB to keep 02 sats >90%; every shift as needed for SOB   polyethylene glycol (MIRALAX  / GLYCOLAX ) 17  g packet Take 17 g by mouth as directed. Every 2 days   potassium chloride  SA (KLOR-CON  M) 20 MEQ tablet Take 20 mEq by mouth as directed. Monday, Tuesday, Wednesday, Thursday, and Friday.   sertraline  (ZOLOFT ) 25 MG tablet Take 50 mg by mouth every morning.   torsemide  (DEMADEX ) 20 MG tablet Take 20 mg by mouth as directed. Once A Day on Mon, Tue, Wed, Thu, Fri   zinc oxide 20 % ointment Apply 1 application topically See admin instructions. Apply topically to per/buttocks after every incontinent episode and as needed for redness   No facility-administered encounter medications on file as of 08/03/2023.    Review of Systems  Unable to perform ROS: Patient nonverbal    Immunization History  Administered  Date(s) Administered   Fluad Quad(high Dose 65+) 05/19/2022, 05/25/2023   Influenza, High Dose Seasonal PF 04/26/2016, 03/22/2017, 05/08/2019   Influenza,inj,Quad PF,6+ Mos 04/27/2018   Influenza-Unspecified 04/22/2011, 04/04/2012, 05/03/2013, 05/04/2013, 04/17/2014, 04/19/2015, 04/26/2016, 04/22/2017, 05/20/2021   Moderna Covid-19 Vaccine Bivalent Booster 6yrs & up 06/01/2023   Moderna SARS-COV2 Booster Vaccination 06/09/2020, 12/31/2020   Moderna Sars-Covid-2 Vaccination 07/30/2019, 08/27/2019, 01/06/2022   PFIZER(Purple Top)SARS-COV-2 Vaccination 06/01/2022   Pfizer Covid-19 Vaccine Bivalent Booster 51yrs & up 04/15/2021   Pneumococcal Conjugate-13 09/06/2011   Pneumococcal Polysaccharide-23 08/26/2003, 09/06/2011, 05/15/2019   Pneumococcal-Unspecified 12/20/2013   Td 09/26/2006   Tdap 05/15/2019   Zoster Recombinant(Shingrix) 11/21/2017, 04/08/2018   Zoster, Live 10/25/2007   Pertinent  Health Maintenance Due  Topic Date Due   OPHTHALMOLOGY EXAM  03/24/2023   INFLUENZA VACCINE  Completed   DEXA SCAN  Completed      10/11/2022   10:15 AM 10/20/2022   10:35 AM 10/26/2022    9:26 AM 01/20/2023    3:50 PM 06/15/2023    3:52 PM  Fall Risk  Falls in the past year? 1 1 0 0 0  Was  there an injury with Fall? 0 0 0 0 0  Fall Risk Category Calculator 2 1 0 0 0  Patient at Risk for Falls Due to History of fall(s);Impaired balance/gait;Impaired mobility History of fall(s);Impaired balance/gait;Impaired mobility History of fall(s);Impaired balance/gait;Impaired mobility  History of fall(s);Impaired balance/gait;Impaired mobility  Fall risk Follow up Falls evaluation completed;Education provided;Falls prevention discussed Falls evaluation completed;Education provided;Falls prevention discussed Falls evaluation completed  Falls evaluation completed;Education provided;Falls prevention discussed   Functional Status Survey:    Vitals:   08/03/23 1224  BP: 127/63  Pulse: 89  Resp: 16  Temp: 99.8 F (37.7 C)  SpO2: 93%  Weight: 152 lb 8 oz (69.2 kg)  Height: 5' 4 (1.626 m)   Body mass index is 26.18 kg/m. Physical Exam Constitutional:      Appearance: She is ill-appearing.  HENT:     Head: Normocephalic.     Right Ear: There is no impacted cerumen.     Left Ear: There is no impacted cerumen.     Nose: Nose normal.     Mouth/Throat:     Mouth: Mucous membranes are moist.  Eyes:     General:        Right eye: No discharge.        Left eye: No discharge.  Cardiovascular:     Rate and Rhythm: Normal rate and regular rhythm.     Pulses: Normal pulses.     Heart sounds: Normal heart sounds.  Pulmonary:     Effort: Pulmonary effort is normal. No respiratory distress.     Breath sounds: Examination of the right-upper field reveals wheezing. Examination of the left-upper field reveals wheezing. Examination of the right-lower field reveals decreased breath sounds. Examination of the left-lower field reveals decreased breath sounds. Decreased breath sounds and wheezing present.  Abdominal:     General: Bowel sounds are normal.     Palpations: Abdomen is soft.  Musculoskeletal:     Cervical back: Neck supple.     Right lower leg: No edema.     Left lower leg: No edema.   Skin:    General: Skin is warm.     Capillary Refill: Capillary refill takes less than 2 seconds.  Neurological:     General: No focal deficit present.     Mental Status: She is lethargic.     Motor:  Weakness present.     Gait: Gait abnormal.  Psychiatric:     Comments: Alert to self, does not follow commands or express needs     Labs reviewed: Recent Labs    10/25/22 0710 04/28/23 0000 05/16/23 0000  NA 140 139 134*  K 4.0 4.2 4.0  CL 107 102 99  CO2 27* 24* 25*  BUN 25* 22* 17  CREATININE 1.3* 1.2* 1.2*  CALCIUM  9.4 9.6 8.6*   Recent Labs    10/25/22 0710 04/28/23 0000  AST 13 11*  ALT 8 8  ALKPHOS 78 77  ALBUMIN 3.8 4.1   Recent Labs    10/25/22 0710 04/28/23 0000 05/16/23 0000  WBC 4.0 3.5 12.0  NEUTROABS 2,748.00 2,111.00 10,212.00  HGB 8.5* 10.4* 8.9*  HCT 27* 33* 29*  PLT 78* 84* 67*   Lab Results  Component Value Date   TSH 2.53 06/16/2023   No results found for: HGBA1C Lab Results  Component Value Date   CHOL 125 10/25/2022   HDL 39 10/25/2022   LDLCALC 69 10/25/2022   TRIG 89 10/25/2022   CHOLHDL 3.8 11/23/2018    Significant Diagnostic Results in last 30 days:  No results found.  Assessment/Plan 1. Acute cough (Primary) - onset x 1 day - wheezing and diminished lung sounds on exam - fever 99.8 - stat CXR to r/o PNA - h/o RUL PNA 09/24/2021> resolved with Levaquin  2. Somnolence - onset x 1 day - opens eyes to voice and sternal rub - stat cbc/diff, bmp> BUN/creat 19/1.20, glucose 94, Na+ 138, K+ 3.9, chloride 101, Co2 27, calcium  9.6 - no hospitalizations per son   3. Fever, unspecified fever cause - temp 99.8 - give tylenol  suppository per protocol - see above - cefTRIAXone  (ROCEPHIN ) 1 g injection; Inject 1 g into the muscle every 12 (twelve) hours for 1 dose.    Family/ staff Communication: plan discussed with patient and nurse  Labs/tests ordered: stat CXR, cbc/diff, bmp

## 2023-08-04 ENCOUNTER — Non-Acute Institutional Stay (SKILLED_NURSING_FACILITY): Payer: Medicare Other | Admitting: Internal Medicine

## 2023-08-04 ENCOUNTER — Encounter: Payer: Self-pay | Admitting: Internal Medicine

## 2023-08-04 DIAGNOSIS — R400 Somnolence: Secondary | ICD-10-CM | POA: Diagnosis not present

## 2023-08-04 DIAGNOSIS — D61818 Other pancytopenia: Secondary | ICD-10-CM

## 2023-08-04 DIAGNOSIS — J690 Pneumonitis due to inhalation of food and vomit: Secondary | ICD-10-CM | POA: Diagnosis not present

## 2023-08-04 MED ORDER — MORPHINE SULFATE (CONCENTRATE) 20 MG/ML PO SOLN: 5.0000 mg | ORAL | 0 refills | Status: DC | PRN

## 2023-08-04 NOTE — Progress Notes (Signed)
 Location:  [facility] Room Number: 35 A Place of Service:  SNF [phone]) Provider: Charlanne Fredia CROME, MD  Patient Care Team: Charlanne Fredia CROME, MD as PCP - General (Internal Medicine) Mast, Man X, NP as Nurse Practitioner (Internal Medicine)  Extended Emergency Contact Information Primary Emergency Contact: [name],[name] Address: [address]          [place], [place] [address] [place] Home Phone: [phone] Mobile Phone: [phone] Relation: Son Secondary Emergency Contact: [name]  [place] Home Phone: [phone] Mobile Phone: [phone] Relation: Other  Code Status: DNR Goals of care: Advanced Directive information    08/04/2023   11:32 AM  Advanced Directives  Does Patient Have a Medical Advance Directive? Yes  Type of Estate Agent of [name];Out of facility DNR (pink MOST or yellow form)  Does patient want to make changes to medical advance directive? No - Patient declined  Copy of Healthcare Power of Attorney in Chart? Yes - validated most recent copy scanned in chart (See row information)  Pre-existing out of facility DNR order (yellow form or pink MOST form) Yellow form placed in chart (order not valid for inpatient use)     Chief Complaint  Patient presents with   Acute Visit    Lethargy     HPI:  Pt is a 89 y.o. female seen today for an acute visit for Lethargy and Pneumonia  Lives in SNF in [facility]   Patient has h/o Pancytopenia , h/o Chronic Back Pain, Hypertension, Hypothyroidism, Hypertension, Recurrent Cystitis, Osteoporosis and Major Depression   Right Trimalleolar Fracture. S/P ORIF in 09/22 Since then Wheelchair and [name] dependent  Seen for Acute cough yesterday with somnolence And low-grade temp.  She was seen by [name].  Started on Rocephin  IM.  Labs were done and her white count was elevated at 16K. This morning patient was slightly better.  But still very weak.  Not eating.  No  fever chest pain or shortness of breath.  Chest x-ray is now back and shows right upper lobe infiltrate. Patient's son wanted to discuss goals of care.  Patient unable to take her p.o. meds Past Medical History:  Diagnosis Date   Anemia    Anxiety    Chronotropic incompetence 12/2012    potentially medication related; noted on CPET    DDD (degenerative disc disease)    Eczema    GERD (gastroesophageal reflux disease)    H/O hiatal hernia    Hx: UTI (urinary tract infection)    Hyperlipidemia    Hypertension    Hypothyroidism    Osteopenia    Septicemia (HCC) 2002   following UTI   Vertigo, benign positional    Past Surgical History:  Procedure Laterality Date   BREAST SURGERY     left biopsy   CATARACT EXTRACTION Right    2 weeks ago   CPET / MET - PFTS     Consistent with chronotropic incompetence; See attached report in the results section   DILATION AND CURETTAGE OF UTERUS     x 2   DOPPLER ECHOCARDIOGRAPHY  01/10/2013   Normal LV size and function. Normal EF. Air sclerosis but no stenosis   ESOPHAGOGASTRODUODENOSCOPY  05/04/2012   Procedure: ESOPHAGOGASTRODUODENOSCOPY (EGD);  Surgeon: Lamar JONETTA Aho, MD;  Location: [facility];  Service: Endoscopy;  Laterality: N/A;   EYE SURGERY     cataract extraction with ILO  ? eye   IR KYPHO EA ADDL LEVEL THORACIC  OR LUMBAR  10/28/2016   IR KYPHO LUMBAR INC FX REDUCE BONE BX UNI/BIL CANNULATION INC/IMAGING  10/28/2016   IR KYPHO THORACIC WITH BONE BIOPSY  12/24/2016   IR RADIOLOGIST EVAL & MGMT  11/11/2016   KNEE ARTHROSCOPY  2011   right   ORIF ANKLE FRACTURE Right 03/27/2021   Procedure: OPEN REDUCTION INTERNAL FIXATION (ORIF) ANKLE FRACTURE;  Surgeon: Josefina Chew, MD;  Location: [facility];  Service: Orthopedics;  Laterality: Right;   TONSILLECTOMY     TOTAL KNEE ARTHROPLASTY  04/24/2012   Procedure: TOTAL KNEE ARTHROPLASTY;  Surgeon: Dempsey LULLA Moan, MD;  Location: [facility];  Service: Orthopedics;  Laterality: Right;   TRANSTHORACIC  ECHOCARDIOGRAM  02/2018   Ordered by PCP for congestive heart failure  -EF 60 M 65%.  GR 1 DD.  No R WMA--- > ESSENTIALLY NORMAL    No Known Allergies  Outpatient Encounter Medications as of 08/04/2023  Medication Sig   acetaminophen  (TYLENOL ) 500 MG tablet Take 1,000 mg by mouth 3 (three) times daily. For pain   acetaminophen  (TYLENOL ) 650 MG suppository Place 650 mg rectally every 4 (four) hours as needed.   ALPRAZolam  (XANAX ) 0.25 MG tablet Take 0.25 mg by mouth every 12 (twelve) hours as needed for anxiety. Until 08/23/23   buPROPion  ER (WELLBUTRIN  SR) 100 MG 12 hr tablet Take 100 mg by mouth 2 (two) times daily.   calcium  citrate-vitamin D  (CITRACAL+D) 315-200 MG-UNIT tablet Take 1 tablet by mouth 2 (two) times daily.   Camphor-Menthol -Methyl Sal 3.07-31-08 % PTCH Place 1 patch onto the skin See admin instructions. Apply one patch onto the skin of the  lower back daily for back pain   Carboxymethylcellulose Sodium (ARTIFICIAL TEARS OP) Place 1 drop into both eyes in the morning, at noon, and at bedtime.   Chlorpheniramine-DM (ROBITUSSIN NIGHTTIME COUGH) 1-7.5 MG/5ML LIQD Take 5 mLs by mouth every 6 (six) hours as needed.   diclofenac Sodium (VOLTAREN) 1 % GEL Apply 4 g topically in the morning and at bedtime. Apply to bilateral ankles BID   ferrous sulfate  325 (65 FE) MG EC tablet Take 325 mg by mouth 2 (two) times a week. Monday & Friday   gabapentin  (NEURONTIN ) 100 MG capsule Take 300 mg by mouth daily.   hydrocortisone cream 1 % Apply 1 Application topically as needed for itching. Apply to skin topically as needed for itching   ipratropium-albuterol  (DUONEB) 0.5-2.5 (3) MG/3ML SOLN Take 3 mLs by nebulization every 12 (twelve) hours as needed (x 5 days).   levothyroxine  (SYNTHROID ) 75 MCG tablet Take 75 mcg by mouth every morning.   losartan  (COZAAR ) 25 MG tablet Take 12.5 mg by mouth every morning.   Menthol , Topical Analgesic, (BIOFREEZE) 4 % GEL Apply topically 2 (two) times daily as  needed. Apply thin layer to left thigh   nystatin (MYCOSTATIN/NYSTOP) powder Apply 1 application  topically as needed (yeast).   nystatin cream (MYCOSTATIN) Apply 1 Application topically as needed.   omeprazole  (PRILOSEC) 40 MG capsule Take 40 mg by mouth every morning.   oxyCODONE  (OXY IR/ROXICODONE ) 5 MG immediate release tablet Take 0.5 tablets (2.5 mg total) by mouth 2 (two) times daily as needed for severe pain (pain score 7-10).   OXYGEN Inhale into the lungs. For SOB to keep 02 sats >90%; every shift as needed for SOB   polyethylene glycol (MIRALAX  / GLYCOLAX ) 17 g packet Take 17 g by mouth as directed. Every 2 days   potassium chloride  SA (KLOR-CON  M) 20 MEQ  tablet Take 20 mEq by mouth as directed. Monday, Tuesday, Wednesday, Thursday, and Friday.   sertraline  (ZOLOFT ) 25 MG tablet Take 75 mg by mouth every morning.   torsemide  (DEMADEX ) 20 MG tablet Take 20 mg by mouth as directed. Once A Day on Mon, Tue, Wed, Thu, Fri   zinc oxide 20 % ointment Apply 1 application topically See admin instructions. Apply topically to per/buttocks after every incontinent episode and as needed for redness   cefTRIAXone  (ROCEPHIN ) 1 g injection Inject 1 g into the muscle every 12 (twelve) hours for 1 dose.   gabapentin  (NEURONTIN ) 300 MG capsule Take 1 capsule (300 mg total) by mouth at bedtime. (Patient not taking: Reported on 08/04/2023)   No facility-administered encounter medications on file as of 08/04/2023.    Review of Systems  Unable to perform ROS: Other    Immunization History  Administered Date(s) Administered   Fluad Quad(high Dose 65+) 05/19/2022, 05/25/2023   Influenza, High Dose Seasonal PF 04/26/2016, 03/22/2017, 05/08/2019   Influenza,inj,Quad PF,6+ Mos 04/27/2018   Influenza-Unspecified 04/22/2011, 04/04/2012, 05/03/2013, 05/04/2013, 04/17/2014, 04/19/2015, 04/26/2016, 04/22/2017, 05/20/2021   Moderna Covid-19 Vaccine Bivalent Booster 36yrs & up 06/01/2023   Moderna SARS-COV2 Booster  Vaccination 06/09/2020, 12/31/2020   Moderna Sars-Covid-2 Vaccination 07/30/2019, 08/27/2019, 01/06/2022   PFIZER(Purple Top)SARS-COV-2 Vaccination 06/01/2022   Pfizer Covid-19 Vaccine Bivalent Booster 60yrs & up 04/15/2021   Pneumococcal Conjugate-13 09/06/2011   Pneumococcal Polysaccharide-23 08/26/2003, 09/06/2011, 05/15/2019   Pneumococcal-Unspecified 12/20/2013   Td 09/26/2006   Tdap 05/15/2019   Zoster Recombinant(Shingrix) 11/21/2017, 04/08/2018   Zoster, Live 10/25/2007   Pertinent  Health Maintenance Due  Topic Date Due   OPHTHALMOLOGY EXAM  03/24/2023   INFLUENZA VACCINE  Completed   DEXA SCAN  Completed      10/11/2022   10:15 AM 10/20/2022   10:35 AM 10/26/2022    9:26 AM 01/20/2023    3:50 PM 06/15/2023    3:52 PM  Fall Risk  Falls in the past year? 1 1 0 0 0  Was there an injury with Fall? 0 0 0 0 0  Fall Risk Category Calculator 2 1 0 0 0  Patient at Risk for Falls Due to History of fall(s);Impaired balance/gait;Impaired mobility History of fall(s);Impaired balance/gait;Impaired mobility History of fall(s);Impaired balance/gait;Impaired mobility  History of fall(s);Impaired balance/gait;Impaired mobility  Fall risk Follow up Falls evaluation completed;Education provided;Falls prevention discussed Falls evaluation completed;Education provided;Falls prevention discussed Falls evaluation completed  Falls evaluation completed;Education provided;Falls prevention discussed   Functional Status Survey:    Vitals:   08/04/23 1129  BP: 127/63  Pulse: 89  Temp: 99.8 F (37.7 C)  SpO2: 93%  Weight: 152 lb 8 oz (69.2 kg)  Height: 5' 4 (1.626 m)   Body mass index is 26.18 kg/m. Physical Exam Vitals reviewed.  Constitutional:      Appearance: Normal appearance.  HENT:     Head: Normocephalic.     Nose: Nose normal.     Mouth/Throat:     Mouth: Mucous membranes are moist.     Pharynx: Oropharynx is clear.  Eyes:     Pupils: Pupils are equal, round, and reactive to  light.  Cardiovascular:     Rate and Rhythm: Normal rate and regular rhythm.     Pulses: Normal pulses.     Heart sounds: Normal heart sounds. No murmur heard. Pulmonary:     Effort: Pulmonary effort is normal.     Breath sounds: Rales present.  Abdominal:     General: Abdomen is  flat. Bowel sounds are normal.     Palpations: Abdomen is soft.  Musculoskeletal:        General: No swelling.     Cervical back: Neck supple.  Skin:    General: Skin is warm.  Neurological:     General: No focal deficit present.     Mental Status: She is alert.     Comments: Did open her eyes and responded but then went to sleep again  Psychiatric:        Mood and Affect: Mood normal.        Thought Content: Thought content normal.     Labs reviewed: Recent Labs    05/16/23 0000 07/28/23 0000 08/03/23 0000  NA 134* 139 138  K 4.0 4.3 3.9  CL 99 105 101  CO2 25* 32* 27*  BUN 17 21 19   CREATININE 1.2* 1.1 1.2*  CALCIUM  8.6* 9.1 9.6   Recent Labs    10/25/22 0710 04/28/23 0000  AST 13 11*  ALT 8 8  ALKPHOS 78 77  ALBUMIN 3.8 4.1   Recent Labs    04/28/23 0000 05/16/23 0000 08/03/23 0000  WBC 3.5 12.0 16.5  NEUTROABS 2,111.00 10,212.00 14,438.00  HGB 10.4* 8.9* 8.7*  HCT 33* 29* 28*  PLT 84* 67* 69*   Lab Results  Component Value Date   TSH 2.53 06/16/2023   No results found for: HGBA1C Lab Results  Component Value Date   CHOL 125 10/25/2022   HDL 39 10/25/2022   LDLCALC 69 10/25/2022   TRIG 89 10/25/2022   CHOLHDL 3.8 11/23/2018    Significant Diagnostic Results in last 30 days:  No results found.  Assessment/Plan Patient with right upper lobe pneumonia, weakness, lethargy, poor appetite .  Discussed with the son .  Patient continues to have recurrent infection could be because of aspiration.  Will discontinue the Rocephin .  Will discontinue all p.o. meds.  Started on Roxanol low-dose for comfort.  Son is open for hospice if needed later on   Family/ staff  Communication:   Labs/tests ordered:

## 2023-08-08 ENCOUNTER — Other Ambulatory Visit: Payer: Self-pay | Admitting: Orthopedic Surgery

## 2023-08-08 DIAGNOSIS — Z515 Encounter for palliative care: Secondary | ICD-10-CM

## 2023-08-08 MED ORDER — LORAZEPAM 1 MG PO TABS: 1.0000 mg | ORAL_TABLET | ORAL | 0 refills | Status: AC | PRN

## 2023-08-08 MED ORDER — MORPHINE SULFATE (CONCENTRATE) 20 MG/ML PO SOLN: 5.0000 mg | ORAL | 0 refills | Status: AC | PRN

## 2023-08-09 ENCOUNTER — Telehealth: Payer: Self-pay | Admitting: Internal Medicine

## 2023-08-09 NOTE — Telephone Encounter (Signed)
 Pat with [facility] called in to say that family is asking for Dr [name] to please sign death certificate that is on the [facility] portal since they need to quickly cremate patients remains. Thank you

## 2023-08-27 DEATH — deceased
